# Patient Record
Sex: Female | Born: 1970 | Race: Asian | Hispanic: No | Marital: Single | Smoking: Never smoker
Health system: Southern US, Community
[De-identification: ages and names within clinical notes are randomized; demographics above are authoritative.]

## PROBLEM LIST (undated history)

## (undated) ENCOUNTER — Emergency Department (HOSPITAL_COMMUNITY): Discharge status: Absent without leave | Payer: Medicaid Other

## (undated) DIAGNOSIS — Z9289 Personal history of other medical treatment: Secondary | ICD-10-CM

## (undated) DIAGNOSIS — N343 Urethral syndrome, unspecified: Secondary | ICD-10-CM

## (undated) DIAGNOSIS — R509 Fever, unspecified: Secondary | ICD-10-CM

## (undated) DIAGNOSIS — N201 Calculus of ureter: Secondary | ICD-10-CM

## (undated) DIAGNOSIS — IMO0002 Reserved for concepts with insufficient information to code with codable children: Secondary | ICD-10-CM

## (undated) DIAGNOSIS — K219 Gastro-esophageal reflux disease without esophagitis: Secondary | ICD-10-CM

## (undated) DIAGNOSIS — N1330 Unspecified hydronephrosis: Secondary | ICD-10-CM

## (undated) DIAGNOSIS — K759 Inflammatory liver disease, unspecified: Secondary | ICD-10-CM

## (undated) DIAGNOSIS — C9010 Plasma cell leukemia not having achieved remission: Secondary | ICD-10-CM

## (undated) DIAGNOSIS — N390 Urinary tract infection, site not specified: Secondary | ICD-10-CM

## (undated) DIAGNOSIS — G709 Myoneural disorder, unspecified: Secondary | ICD-10-CM

## (undated) DIAGNOSIS — A419 Sepsis, unspecified organism: Secondary | ICD-10-CM

## (undated) DIAGNOSIS — J189 Pneumonia, unspecified organism: Secondary | ICD-10-CM

## (undated) HISTORY — PX: LIVER BIOPSY: SHX301

## (undated) HISTORY — PX: OTHER SURGICAL HISTORY: SHX169

---

## 2008-10-23 ENCOUNTER — Inpatient Hospital Stay (HOSPITAL_COMMUNITY): Admission: EM | Admit: 2008-10-23 | Discharge: 2008-11-04 | Payer: Self-pay | Admitting: Emergency Medicine

## 2008-10-24 ENCOUNTER — Ambulatory Visit: Payer: Self-pay | Admitting: Pulmonary Disease

## 2008-10-25 ENCOUNTER — Encounter (INDEPENDENT_AMBULATORY_CARE_PROVIDER_SITE_OTHER): Payer: Self-pay | Admitting: Interventional Radiology

## 2008-10-26 ENCOUNTER — Ambulatory Visit: Payer: Self-pay | Admitting: Thoracic Surgery

## 2008-10-27 ENCOUNTER — Ambulatory Visit: Payer: Self-pay | Admitting: Infectious Diseases

## 2008-10-30 ENCOUNTER — Encounter: Payer: Self-pay | Admitting: Infectious Diseases

## 2008-10-30 ENCOUNTER — Encounter: Payer: Self-pay | Admitting: Thoracic Surgery

## 2008-10-30 HISTORY — PX: OTHER SURGICAL HISTORY: SHX169

## 2008-11-14 ENCOUNTER — Ambulatory Visit: Payer: Self-pay | Admitting: Thoracic Surgery

## 2008-11-14 ENCOUNTER — Encounter: Admission: RE | Admit: 2008-11-14 | Discharge: 2008-11-14 | Payer: Self-pay | Admitting: Thoracic Surgery

## 2008-12-04 ENCOUNTER — Ambulatory Visit: Payer: Self-pay | Admitting: Internal Medicine

## 2008-12-06 ENCOUNTER — Ambulatory Visit: Payer: Self-pay | Admitting: Thoracic Surgery

## 2008-12-06 ENCOUNTER — Encounter: Admission: RE | Admit: 2008-12-06 | Discharge: 2008-12-06 | Payer: Self-pay | Admitting: Thoracic Surgery

## 2008-12-08 ENCOUNTER — Ambulatory Visit: Payer: Self-pay | Admitting: *Deleted

## 2008-12-08 ENCOUNTER — Encounter: Payer: Self-pay | Admitting: Infectious Diseases

## 2009-01-03 ENCOUNTER — Ambulatory Visit: Payer: Self-pay | Admitting: Internal Medicine

## 2009-01-09 ENCOUNTER — Ambulatory Visit: Payer: Self-pay | Admitting: Internal Medicine

## 2009-01-09 ENCOUNTER — Encounter: Payer: Self-pay | Admitting: Family Medicine

## 2009-01-09 LAB — CONVERTED CEMR LAB
AST: 26 units/L (ref 0–37)
BUN: 8 mg/dL (ref 6–23)
Basophils Relative: 0 % (ref 0–1)
CO2: 23 meq/L (ref 19–32)
Chloride: 101 meq/L (ref 96–112)
Eosinophils Absolute: 0.2 10*3/uL (ref 0.0–0.7)
Eosinophils Relative: 4 % (ref 0–5)
HCT: 36.5 % (ref 36.0–46.0)
HDL: 76 mg/dL (ref >39)
LDL Cholesterol: 101 mg/dL — ABNORMAL HIGH (ref 0–99)
Lymphocytes Relative: 29 % (ref 12–46)
MCHC: 31.5 g/dL (ref 30.0–36.0)
MCV: 88.2 fL (ref 78.0–100.0)
Monocytes Absolute: 0.5 10*3/uL (ref 0.1–1.0)
Monocytes Relative: 10 % (ref 3–12)
Neutro Abs: 2.6 10*3/uL (ref 1.7–7.7)
Platelets: 341 10*3/uL (ref 150–400)
Potassium: 4 meq/L (ref 3.5–5.3)
RDW: 13.5 % (ref 11.5–15.5)
TSH: 3.573 microintl units/mL (ref 0.350–4.500)
Total CHOL/HDL Ratio: 2.7
Triglycerides: 130 mg/dL (ref <150)
VLDL: 26 mg/dL (ref 0–40)
WBC: 4.6 10*3/uL (ref 4.0–10.5)

## 2009-01-24 ENCOUNTER — Encounter: Admission: RE | Admit: 2009-01-24 | Discharge: 2009-01-24 | Payer: Self-pay | Admitting: Infectious Diseases

## 2009-06-11 ENCOUNTER — Encounter: Admission: RE | Admit: 2009-06-11 | Discharge: 2009-06-11 | Payer: Self-pay | Admitting: Infectious Diseases

## 2010-02-24 ENCOUNTER — Encounter: Payer: Self-pay | Admitting: Thoracic Surgery

## 2010-05-09 LAB — BASIC METABOLIC PANEL
BUN: 9 mg/dL (ref 6–23)
Chloride: 98 mEq/L (ref 96–112)
GFR calc Af Amer: >60 mL/min (ref >60)
GFR calc non Af Amer: >60 mL/min (ref >60)
Potassium: 3.5 mEq/L (ref 3.5–5.1)
Sodium: 135 mEq/L (ref 135–145)

## 2010-05-09 LAB — CBC
HCT: 28.3 % — ABNORMAL LOW (ref 36.0–46.0)
MCV: 83.7 fL (ref 78.0–100.0)
Platelets: 480 10*3/uL — ABNORMAL HIGH (ref 150–400)
RBC: 3.38 MIL/uL — ABNORMAL LOW (ref 3.87–5.11)
WBC: 6.9 10*3/uL (ref 4.0–10.5)

## 2010-05-10 LAB — CBC
HCT: 32 % — ABNORMAL LOW (ref 36.0–46.0)
HCT: 32.6 % — ABNORMAL LOW (ref 36.0–46.0)
HCT: 34.1 % — ABNORMAL LOW (ref 36.0–46.0)
HCT: 37.2 % (ref 36.0–46.0)
Hemoglobin: 10.9 g/dL — ABNORMAL LOW (ref 12.0–15.0)
Hemoglobin: 9.8 g/dL — ABNORMAL LOW (ref 12.0–15.0)
MCHC: 33.2 g/dL (ref 30.0–36.0)
MCHC: 33.4 g/dL (ref 30.0–36.0)
MCHC: 33.4 g/dL (ref 30.0–36.0)
MCHC: 33.5 g/dL (ref 30.0–36.0)
MCV: 83.9 fL (ref 78.0–100.0)
MCV: 84.3 fL (ref 78.0–100.0)
MCV: 84.4 fL (ref 78.0–100.0)
Platelets: 505 10*3/uL — ABNORMAL HIGH (ref 150–400)
RBC: 3.45 MIL/uL — ABNORMAL LOW (ref 3.87–5.11)
RBC: 3.81 MIL/uL — ABNORMAL LOW (ref 3.87–5.11)
RBC: 4.05 MIL/uL (ref 3.87–5.11)
RBC: 4.41 MIL/uL (ref 3.87–5.11)
RDW: 12.6 % (ref 11.5–15.5)
RDW: 12.6 % (ref 11.5–15.5)
WBC: 6.8 10*3/uL (ref 4.0–10.5)
WBC: 7.8 10*3/uL (ref 4.0–10.5)
WBC: 7.9 10*3/uL (ref 4.0–10.5)
WBC: 9.2 10*3/uL (ref 4.0–10.5)

## 2010-05-10 LAB — QUANTIFERON TB GOLD ASSAY (BLOOD): Interferon Gamma Release Assay: UNDETERMINED — AB

## 2010-05-10 LAB — POCT I-STAT 3, ART BLOOD GAS (G3+)
Acid-Base Excess: 3 mmol/L — ABNORMAL HIGH (ref 0.0–2.0)
O2 Saturation: 98 %
Patient temperature: 98.1
pO2, Arterial: 109 mmHg — ABNORMAL HIGH (ref 80.0–100.0)

## 2010-05-10 LAB — AFB CULTURE WITH SMEAR (NOT AT ARMC)
Acid Fast Smear: NONE SEEN
Acid Fast Smear: NONE SEEN
Acid Fast Smear: NONE SEEN
Acid Fast Smear: NONE SEEN

## 2010-05-10 LAB — EXPECTORATED SPUTUM ASSESSMENT W GRAM STAIN, RFLX TO RESP C

## 2010-05-10 LAB — COMPREHENSIVE METABOLIC PANEL
Albumin: 2.5 g/dL — ABNORMAL LOW (ref 3.5–5.2)
BUN: 3 mg/dL — ABNORMAL LOW (ref 6–23)
BUN: 4 mg/dL — ABNORMAL LOW (ref 6–23)
BUN: 7 mg/dL (ref 6–23)
CO2: 25 mEq/L (ref 19–32)
CO2: 28 mEq/L (ref 19–32)
Calcium: 8.5 mg/dL (ref 8.4–10.5)
Calcium: 8.8 mg/dL (ref 8.4–10.5)
Chloride: 100 mEq/L (ref 96–112)
Chloride: 95 mEq/L — ABNORMAL LOW (ref 96–112)
Creatinine, Ser: 0.52 mg/dL (ref 0.4–1.2)
Creatinine, Ser: 0.73 mg/dL (ref 0.4–1.2)
GFR calc Af Amer: >60 mL/min (ref >60)
GFR calc non Af Amer: >60 mL/min (ref >60)
GFR calc non Af Amer: >60 mL/min (ref >60)
Glucose, Bld: 117 mg/dL — ABNORMAL HIGH (ref 70–99)
Glucose, Bld: 131 mg/dL — ABNORMAL HIGH (ref 70–99)
Glucose, Bld: 92 mg/dL (ref 70–99)
Sodium: 135 mEq/L (ref 135–145)
Total Bilirubin: 0.7 mg/dL (ref 0.3–1.2)
Total Bilirubin: 0.9 mg/dL (ref 0.3–1.2)
Total Protein: 7.1 g/dL (ref 6.0–8.3)

## 2010-05-10 LAB — BODY FLUID CELL COUNT WITH DIFFERENTIAL
Eos, Fluid: 0 %
Lymphs, Fluid: 98 %
Monocyte-Macrophage-Serous Fluid: 2 % — ABNORMAL LOW (ref 50–90)
Other Cells, Fluid: 0 %

## 2010-05-10 LAB — BODY FLUID CULTURE: Culture: NO GROWTH

## 2010-05-10 LAB — TISSUE CULTURE
Culture: NO GROWTH
Gram Stain: NONE SEEN

## 2010-05-10 LAB — URINALYSIS, ROUTINE W REFLEX MICROSCOPIC
Glucose, UA: NEGATIVE mg/dL
Ketones, ur: 15 mg/dL — AB
Nitrite: NEGATIVE
Specific Gravity, Urine: 1.022 (ref 1.005–1.030)
pH: 6.5 (ref 5.0–8.0)

## 2010-05-10 LAB — CULTURE, BLOOD (ROUTINE X 2)
Culture: NO GROWTH
Culture: NO GROWTH

## 2010-05-10 LAB — MRSA PCR SCREENING: MRSA by PCR: NEGATIVE

## 2010-05-10 LAB — HEPATITIS PANEL, ACUTE: Hep A IgM: NEGATIVE

## 2010-05-10 LAB — URINE MICROSCOPIC-ADD ON

## 2010-05-10 LAB — CROSSMATCH: ABO/RH(D): O POS

## 2010-05-10 LAB — DIFFERENTIAL
Basophils Absolute: 0 10*3/uL (ref 0.0–0.1)
Eosinophils Relative: 1 % (ref 0–5)
Lymphocytes Relative: 23 % (ref 12–46)
Lymphs Abs: 1.8 10*3/uL (ref 0.7–4.0)
Neutro Abs: 4.8 10*3/uL (ref 1.7–7.7)
Neutrophils Relative %: 61 % (ref 43–77)

## 2010-05-10 LAB — BASIC METABOLIC PANEL
CO2: 25 mEq/L (ref 19–32)
Calcium: 8.7 mg/dL (ref 8.4–10.5)
Chloride: 103 mEq/L (ref 96–112)
GFR calc Af Amer: >60 mL/min (ref >60)
GFR calc Af Amer: >60 mL/min (ref >60)
GFR calc non Af Amer: >60 mL/min (ref >60)
Glucose, Bld: 116 mg/dL — ABNORMAL HIGH (ref 70–99)
Potassium: 4.1 mEq/L (ref 3.5–5.1)
Sodium: 132 mEq/L — ABNORMAL LOW (ref 135–145)

## 2010-05-10 LAB — LIPASE, BLOOD: Lipase: 19 U/L (ref 11–59)

## 2010-05-10 LAB — HIV-1 RNA QUANT-NO REFLEX-BLD
HIV 1 RNA Quant: <48 copies/mL (ref <48)
HIV-1 RNA Quant, Log: <1.68 {Log} (ref <1.68)

## 2010-05-10 LAB — ANTI-NUCLEAR AB-TITER (ANA TITER)

## 2010-05-10 LAB — LACTATE DEHYDROGENASE, PLEURAL OR PERITONEAL FLUID: LD, Fluid: 414 U/L — ABNORMAL HIGH (ref 3–23)

## 2010-05-10 LAB — GLUCOSE, SEROUS FLUID: Glucose, Fluid: 106 mg/dL

## 2010-05-10 LAB — FUNGUS CULTURE W SMEAR
Fungal Smear: NONE SEEN
Fungal Smear: NONE SEEN

## 2010-05-10 LAB — RHEUMATOID FACTOR: Rhuematoid fact SerPl-aCnc: <20 IU/mL (ref 0–20)

## 2010-05-10 LAB — ABO/RH: ABO/RH(D): O POS

## 2010-05-10 LAB — AMYLASE: Amylase: 110 U/L (ref 27–131)

## 2010-05-10 LAB — ADENOSINE DEAMINASE, FLUID: Adenosine Deaminase, Fluid: 3.7

## 2010-05-10 LAB — POCT PREGNANCY, URINE: Preg Test, Ur: NEGATIVE

## 2010-05-10 LAB — PH, BODY FLUID: pH, Fluid: 7.5

## 2010-06-18 NOTE — Letter (Signed)
November 14, 2008   Delfino Lovett, MD  [facility]  [address] [place], [place]  [address]   Re:  Sheryl [name], Sheryl [name]               DOB:  1971-01-13   Dear Dr. 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  Her chest x-ray  shows that there is still a thickened pleura, but this is improving.  She has a temp of 100.4, saturations were 98%, blood pressure was  118/78, pulse 100, and respirations 18.  I gave her a refill for her  pain medication and Tylox since she is still having a lot of pain.  She  apparently will be seeing you in the near future regarding further  treatment of her TB.  I will see her again in 3 weeks with a chest x-  ray.  Her incisions as mentioned are well healed.   Sincerely,   Ines Bloomer, M.D.  Electronically Signed   DPB/MEDQ  D:  11/14/2008  T:  11/15/2008  Job:  [id number]

## 2010-06-18 NOTE — Assessment & Plan Note (Signed)
OFFICE VISIT   Sheryl [name], Sheryl [name]  DOB:  1970/11/11                                        December 06, 2008  CHART #:  [id number]   Ms. Sheryl [name] came back for followup today.  Her chest x-ray still shows  some reaction in the right base but this continues to improve.  She has  some moderate pain there but overall is doing well and looks much better  than when we saw her last.  I released her to return to work in 3 weeks.  Her blood pressure is 116/74, pulse 97, respirations 18, saturations  were 98%.  I will see her back again in 6 weeks for final check.   Ines Bloomer, M.D.  Electronically Signed   DPB/MEDQ  D:  12/06/2008  T:  12/06/2008  Job:  [id number]

## 2010-08-27 ENCOUNTER — Emergency Department (HOSPITAL_COMMUNITY)
Admission: EM | Admit: 2010-08-27 | Discharge: 2010-08-27 | Disposition: A | Discharge status: Home / Self Care | Payer: Medicaid Other | Attending: Emergency Medicine | Admitting: Emergency Medicine

## 2010-08-27 ENCOUNTER — Emergency Department (HOSPITAL_COMMUNITY): Payer: Medicaid Other

## 2010-08-27 ENCOUNTER — Inpatient Hospital Stay (INDEPENDENT_AMBULATORY_CARE_PROVIDER_SITE_OTHER)
Admission: RE | Admit: 2010-08-27 | Discharge: 2010-08-27 | Disposition: A | Discharge status: Home / Self Care | Payer: Medicaid Other | Source: Ambulatory Visit | Attending: Family Medicine | Admitting: Family Medicine

## 2010-08-27 ENCOUNTER — Encounter (HOSPITAL_COMMUNITY): Payer: Self-pay | Admitting: Radiology

## 2010-08-27 DIAGNOSIS — R109 Unspecified abdominal pain: Secondary | ICD-10-CM | POA: Insufficient documentation

## 2010-08-27 DIAGNOSIS — N390 Urinary tract infection, site not specified: Secondary | ICD-10-CM | POA: Insufficient documentation

## 2010-08-27 DIAGNOSIS — R10811 Right upper quadrant abdominal tenderness: Secondary | ICD-10-CM

## 2010-08-27 LAB — URINALYSIS, ROUTINE W REFLEX MICROSCOPIC
Bilirubin Urine: NEGATIVE
Ketones, ur: NEGATIVE mg/dL
Nitrite: NEGATIVE
Protein, ur: NEGATIVE mg/dL
Urobilinogen, UA: 0.2 mg/dL (ref 0.0–1.0)
pH: 6.5 (ref 5.0–8.0)

## 2010-08-27 LAB — LIPASE, BLOOD: Lipase: 30 U/L (ref 11–59)

## 2010-08-27 LAB — CBC
Hemoglobin: 10.2 g/dL — ABNORMAL LOW (ref 12.0–15.0)
MCH: 26.4 pg (ref 26.0–34.0)
RBC: 3.86 MIL/uL — ABNORMAL LOW (ref 3.87–5.11)

## 2010-08-27 LAB — DIFFERENTIAL
Basophils Relative: 0 % (ref 0–1)
Monocytes Relative: 5 % (ref 3–12)
Neutro Abs: 3.9 10*3/uL (ref 1.7–7.7)
Neutrophils Relative %: 65 % (ref 43–77)

## 2010-08-27 LAB — COMPREHENSIVE METABOLIC PANEL
ALT: 5 U/L (ref 0–35)
Alkaline Phosphatase: 72 U/L (ref 39–117)
CO2: 28 mEq/L (ref 19–32)
GFR calc Af Amer: >60 mL/min (ref >60)
GFR calc non Af Amer: >60 mL/min (ref >60)
Glucose, Bld: 88 mg/dL (ref 70–99)
Potassium: 4.1 mEq/L (ref 3.5–5.1)
Sodium: 139 mEq/L (ref 135–145)

## 2010-08-27 MED ORDER — IOHEXOL 300 MG/ML  SOLN
80.0000 mL | Freq: Once | INTRAMUSCULAR | Status: AC | PRN
  Administered 2010-08-27: 80 mL via INTRAVENOUS

## 2010-08-29 LAB — URINE CULTURE
Colony Count: >=100000
Culture  Setup Time: 201207242003

## 2011-05-05 ENCOUNTER — Emergency Department (HOSPITAL_COMMUNITY)
Admission: EM | Admit: 2011-05-05 | Discharge: 2011-05-05 | Disposition: A | Discharge status: Home / Self Care | Payer: Medicaid Other | Source: Home / Self Care

## 2011-05-05 ENCOUNTER — Encounter (HOSPITAL_COMMUNITY): Payer: Self-pay

## 2011-05-05 DIAGNOSIS — N390 Urinary tract infection, site not specified: Secondary | ICD-10-CM

## 2011-05-05 LAB — POCT URINALYSIS DIP (DEVICE)
Ketones, ur: NEGATIVE mg/dL
Nitrite: POSITIVE — AB
Protein, ur: NEGATIVE mg/dL
Urobilinogen, UA: 0.2 mg/dL (ref 0.0–1.0)
pH: 7 (ref 5.0–8.0)

## 2011-05-05 MED ORDER — ACETAMINOPHEN 325 MG PO TABS
ORAL_TABLET | ORAL | Status: AC
  Filled 2011-05-05: qty 3

## 2011-05-05 MED ORDER — CEPHALEXIN 500 MG PO CAPS: 500.0000 mg | ORAL_CAPSULE | Freq: Three times a day (TID) | ORAL | Status: AC

## 2011-05-05 MED ORDER — ACETAMINOPHEN 500 MG PO TABS
1000.0000 mg | ORAL_TABLET | Freq: Once | ORAL | Status: AC
  Administered 2011-05-05: 1000 mg via ORAL

## 2011-05-05 NOTE — ED Provider Notes (Signed)
Sheryl [name] is a 41 y.o. female who presents to Urgent Care today for  1) cough, fever, congestion starting yesterday.  No trouble breathing.  No chest pain vomiting or diarrhea.  2) abdominal pain present for over one year however worsened yesterday and became febrile. Pain radiates to the right back.  Has not tried any medications for the pain yet. Associated with dysuria described as burning when she PTs and urinary frequency.    Patient presents with translator   PMH reviewed. Significant for abdominal pain in July of 2012 that was determined to be a urinary tract infection ROS as above otherwise neg.  no chest pains, palpitations, fevers, chills,  pain nausea or vomiting. Medications reviewed. Current Facility-Administered Medications  Medication Dose Route Frequency Provider Last Rate Last Dose  . acetaminophen (TYLENOL) tablet 1,000 mg  1,000 mg Oral Once Rodolph Bong, MD   1,000 mg at 05/05/11 1911   Current Outpatient Prescriptions  Medication Sig Dispense Refill  . cephALEXin (KEFLEX) 500 MG capsule Take 1 capsule (500 mg total) by mouth 3 (three) times daily.  21 capsule  0    Exam:  BP 123/85  Pulse 106  Temp(Src) 101.4 F (38.6 C) (Oral)  Resp 18  SpO2 97%  LMP 04/23/2011 Gen: Well NAD HEENT: EOMI,  MMM Lungs: CTABL Nl WOB Heart: RRR no MRG Abd: NABS, , ND. Mildly tender over the lower midline no rebound or guarding No costovertebral angle tenderness Exts: Non edematous BL  LE, warm and well perfused.   Results for orders placed during the hospital encounter of 05/05/11 (from the past 24 hour(s))  POCT URINALYSIS DIP (DEVICE)     Status: Abnormal   Collection Time   05/05/11  7:08 PM      Component Value Range   Glucose, UA NEGATIVE  NEGATIVE (mg/dL)   Bilirubin Urine NEGATIVE  NEGATIVE    Ketones, ur NEGATIVE  NEGATIVE (mg/dL)   Specific Gravity, Urine 1.015  1.005 - 1.030    Hgb urine dipstick TRACE (*) NEGATIVE    pH 7.0  5.0 - 8.0    Protein, ur NEGATIVE   NEGATIVE (mg/dL)   Urobilinogen, UA 0.2  0.0 - 1.0 (mg/dL)   Nitrite POSITIVE (*) NEGATIVE    Leukocytes, UA TRACE (*) NEGATIVE     Assessment and Plan: 40 y.o. female with likely urinary tract infection.  Plan to treat with Keflex 500 mg 3 times a day for one week.  Followup in urgent care if not improving. Discussed warning signs or symptoms translator and provided a [place] language handout.  Respiratory symptoms may be a viral URI.  I do not think they're related.  She has good oxygen saturation and clear lung sounds.  Advised return to health care if trouble breathing.  Used a Nurse, learning disability. Patient expresses understanding.  Additionally I recommend that she followup and establish with a primary care doctor at [facility] family practice Center. I provided her with the instructions in how to do this.     Rodolph Bong, MD 05/05/11 [phone]

## 2011-05-05 NOTE — ED Notes (Signed)
C/o pain upper abdominal area, fever, cough , since yesterday; looks sick

## 2011-05-05 NOTE — Discharge Instructions (Signed)
Thank you for coming in today. Schedule a new appointment with the family practice center. Call [phone].  Take the antibiotic three times a day for 1 week.  Call or go to the emergency room if you get worse, have trouble breathing, have chest pains, or palpitations.   B?nh Vim ???ng Ti?t Ni?u (Urinary Tract Infection) Vim bng quang (vim b?ng ?i), ho?c vim th?n (vim b? th?n), ho?c nhi?m trng tuy?n ti?n li?t (vim tuy?n ti?n li?t) th??ng ?p ?ng v?i thu?c khng sinh. Hy u?ng t?t c? thu?c bc s? cho b?n cho ??n khi h?t. B?n c th? th?y ?? trong vi ngy, nh?ng HY U?NG H?T THU?C n?u khng nhi?m trng c th? khng ph?n ?ng l?i v tr? nn kh ch?a h?n. H??NG D?N CH?M [facility] T?I NH  U?ng ?? n??c v ch?t l?ng ?? n??c ti?u trong ho?c c mu vng nh?t. Ngoi vi?c u?ng th?t nhi?u n??c,??c bi?t nn u?ng n??c qu? nam vi?t qu?t (cranberry juice).   Trnh ch?t caffein, ch v ?? u?ng c ga, v nh?ng ch?t ny d? gy kch thch bng quang.   R??u c th? kch thch tuy?n ti?n li?t.   Ch? dng cc thu?c ???c bn khng c?n ??n thu?c c?a Bc s? ho?c theo toa c?a Bc s? ?? gi?m ?au, kh ch?u, hay s?t theo nh? h??ng d?n c?a Bc s?.  ?? PHNG TRNH NHI?M TRNG THM:  Th??ng xuyn ?i ti?u. Trnh nh?n ti?u lu.   Sau khi ?i ngoi, ph? n? nn lau s?ch t? tr??c ra sau, m?i gi?y ch? s? d?ng m?t l?n.   ?i ti?u tr??c v sau khi giao h?p.  TM HI?U K?T QU? XT NGHI?M Khng ph?i m?i k?t qu? ??u c s?n trong l?n khm c?a b?n. N?u k?t qu? xt nghi?m c?a b?n khng ???c tr? l?i trong l?n khm c?a b?n, hy h?n v?i chuyn gia ch?m [place] y t? ?? tm hi?u k?t qu?Imagene Sheller cho m?i th? l bnh th??ng n?u b?n khng th?y chuyn gia ch?m [place] y t? ho?c c? s? y t? ni g. ?i?u quan tr?ng ??i v?i b?n l theo di m?i k?t qu? xt nghi?m c?a b?n. HY THAM V?N V?I CHUYN GIA Y T? N?U:  B?n pht tri?n ?au l?ng.   Con b?n trn 3 thng tu?i c nhi?t ?? ?o t?i h?u mn l 100,5 F (38,1 C) tr? ln trong h?n 1 ngy.   V?n ?? (tri?u  ch?ng) c?a b?n khng t?t h?n trong 3 ngy. Quay tr? l?i s?m h?n n?u tnh tr?ng c?a b?n tr? nn t? h?n.  ?I KHM B?NH NGAY L?P T?C N?U:  ?au l?ng nhi?u hay ?au b?ng d??i nhi?u.   B?n pht tri?n ?n l?nh.   B?n b? s?t.   Con b?n trn 3 thng tu?i c nhi?t ?? ?o t?i h?u mn l 102 F (38 C) tr? ln.   Con b?n t? 3 thng tu?i tr? xu?ng c nhi?t ?? ?o t?i h?u mn l 100,4 F (38 C) tr? ln.   Bu?n nn hay i m?a.   Kh ch?u hay nng rt khi ?i ti?u.  HY CH?C CH?N R?NG B?N:  Hi?u r nh?ng h??ng d?n khi xu?t vi?n.   S? theo di tnh tr?ng b?nh c?a b?n.   S? ??n khm b?nh ngay l?p t?c nh? ? ???c h??ng d?n.  Document Released: 01/20/2005 Document Revised: 01/09/2011 [facility] Patient Information 2012 [place], [place].

## 2011-05-05 NOTE — ED Provider Notes (Signed)
Medical screening examination/treatment/procedure(s) were performed by non-physician practitioner and as supervising physician I was immediately available for consultation/collaboration.  Raynald Blend, MD 05/05/11 2031

## 2011-06-20 ENCOUNTER — Emergency Department (HOSPITAL_COMMUNITY): Payer: Medicaid Other

## 2011-06-20 ENCOUNTER — Emergency Department (HOSPITAL_COMMUNITY)
Admission: EM | Admit: 2011-06-20 | Discharge: 2011-06-20 | Disposition: A | Discharge status: Home / Self Care | Payer: Medicaid Other | Attending: Emergency Medicine | Admitting: Emergency Medicine

## 2011-06-20 DIAGNOSIS — R1013 Epigastric pain: Secondary | ICD-10-CM | POA: Insufficient documentation

## 2011-06-20 DIAGNOSIS — K279 Peptic ulcer, site unspecified, unspecified as acute or chronic, without hemorrhage or perforation: Secondary | ICD-10-CM

## 2011-06-20 LAB — CBC
HCT: 33.3 % — ABNORMAL LOW (ref 36.0–46.0)
Hemoglobin: 10.8 g/dL — ABNORMAL LOW (ref 12.0–15.0)
MCH: 26.3 pg (ref 26.0–34.0)
MCHC: 32.4 g/dL (ref 30.0–36.0)
MCV: 81 fL (ref 78.0–100.0)
Platelets: 349 10*3/uL (ref 150–400)
RBC: 4.11 MIL/uL (ref 3.87–5.11)
RDW: 13.9 % (ref 11.5–15.5)
WBC: 6.8 10*3/uL (ref 4.0–10.5)

## 2011-06-20 LAB — COMPREHENSIVE METABOLIC PANEL
ALT: <5 U/L (ref 0–35)
AST: 19 U/L (ref 0–37)
Albumin: 3.3 g/dL — ABNORMAL LOW (ref 3.5–5.2)
Alkaline Phosphatase: 70 U/L (ref 39–117)
BUN: 9 mg/dL (ref 6–23)
CO2: 27 mEq/L (ref 19–32)
Calcium: 8.9 mg/dL (ref 8.4–10.5)
Chloride: 107 mEq/L (ref 96–112)
Creatinine, Ser: 0.63 mg/dL (ref 0.50–1.10)
GFR calc Af Amer: >90 mL/min (ref >90)
GFR calc non Af Amer: >90 mL/min (ref >90)
Glucose, Bld: 91 mg/dL (ref 70–99)
Potassium: 4.1 mEq/L (ref 3.5–5.1)
Sodium: 140 mEq/L (ref 135–145)
Total Bilirubin: 0.2 mg/dL — ABNORMAL LOW (ref 0.3–1.2)
Total Protein: 7.8 g/dL (ref 6.0–8.3)

## 2011-06-20 LAB — URINALYSIS, ROUTINE W REFLEX MICROSCOPIC
Bilirubin Urine: NEGATIVE
Glucose, UA: NEGATIVE mg/dL
Hgb urine dipstick: NEGATIVE
Ketones, ur: NEGATIVE mg/dL
Leukocytes, UA: NEGATIVE
Nitrite: NEGATIVE
Protein, ur: NEGATIVE mg/dL
Specific Gravity, Urine: 1.002 — ABNORMAL LOW (ref 1.005–1.030)
Urobilinogen, UA: 0.2 mg/dL (ref 0.0–1.0)
pH: 7.5 (ref 5.0–8.0)

## 2011-06-20 LAB — LIPASE, BLOOD: Lipase: 27 U/L (ref 11–59)

## 2011-06-20 LAB — PREGNANCY, URINE: Preg Test, Ur: NEGATIVE

## 2011-06-20 MED ORDER — ONDANSETRON HCL 4 MG/2ML IJ SOLN
INTRAMUSCULAR | Status: AC
  Administered 2011-06-20: 12:00:00
  Filled 2011-06-20: qty 2

## 2011-06-20 MED ORDER — SODIUM CHLORIDE 0.9 % IV BOLUS (SEPSIS)
1000.0000 mL | Freq: Once | INTRAVENOUS | Status: AC
  Administered 2011-06-20: 1000 mL via INTRAVENOUS

## 2011-06-20 MED ORDER — HYDROMORPHONE HCL PF 1 MG/ML IJ SOLN
1.0000 mg | Freq: Once | INTRAMUSCULAR | Status: AC
  Administered 2011-06-20: 1 mg via INTRAVENOUS
  Filled 2011-06-20: qty 1

## 2011-06-20 MED ORDER — GI COCKTAIL ~~LOC~~
30.0000 mL | Freq: Once | ORAL | Status: AC
  Administered 2011-06-20: 30 mL via ORAL
  Filled 2011-06-20: qty 30

## 2011-06-20 MED ORDER — ONDANSETRON HCL 4 MG/2ML IJ SOLN
4.0000 mg | Freq: Once | INTRAMUSCULAR | Status: AC
  Administered 2011-06-20: 4 mg via INTRAVENOUS
  Filled 2011-06-20: qty 2

## 2011-06-20 MED ORDER — PANTOPRAZOLE SODIUM 20 MG PO TBEC: 40.0000 mg | DELAYED_RELEASE_TABLET | Freq: Every day | ORAL | Status: DC

## 2011-06-20 NOTE — ED Provider Notes (Signed)
History    Pt speaks some english. Primary language [place]. Language interpretor used.  Epigastric to RUE pain. Intermittent for about a year but more or less constant for past several days and more severe. Burning. Does not radiate. No n/v. No positional component. Sometimes worse after eating. No urinary complaints. No fever or chills. Denies hx of abdominal surgery. Has not tried anything for it.  CSN: [id number]  Arrival date & time 06/20/11  1141   First MD Initiated Contact with Patient 06/20/11 1159      Chief Complaint  Patient presents with  . Abdominal Pain  . Emesis    (Consider location/radiation/quality/duration/timing/severity/associated sxs/prior treatment) HPI  No past medical history on file.  No past surgical history on file.  No family history on file.  History  Substance Use Topics  . Smoking status: Not on file  . Smokeless tobacco: Not on file  . Alcohol Use: Not on file    OB History    Grav Para Term Preterm Abortions TAB SAB Ect Mult Living                  Review of Systems   Review of symptoms negative unless otherwise noted in HPI.   Allergies  Review of patient's allergies indicates no known allergies.  Home Medications   Current Outpatient Rx  Name Route Sig Dispense Refill  . PANTOPRAZOLE SODIUM 20 MG PO TBEC Oral Take 2 tablets (40 mg total) by mouth daily. 30 tablet 2    BP 113/60  Pulse 63  Temp(Src) 97.8 F (36.6 C) (Oral)  Resp 18  SpO2 100%  Physical Exam  Nursing note and vitals reviewed. Constitutional: She appears well-developed and well-nourished. No distress.  HENT:  Head: Normocephalic and atraumatic.  Eyes: Conjunctivae are normal. Right eye exhibits no discharge. Left eye exhibits no discharge.  Neck: Neck supple.  Cardiovascular: Normal rate, regular rhythm and normal heart sounds.  Exam reveals no gallop and no friction rub.   No murmur heard. Pulmonary/Chest: Effort normal and breath sounds  normal. No respiratory distress.  Abdominal: Soft. She exhibits no distension. There is tenderness.       Mild tenderness in epigastrium. No rebound or guarding. No mass. No surgical scars noted.  Musculoskeletal: She exhibits no edema and no tenderness.  Neurological: She is alert.  Skin: Skin is warm and dry.  Psychiatric: She has a normal mood and affect. Her behavior is normal. Thought content normal.    ED Course  Procedures (including critical care time)  Labs Reviewed  COMPREHENSIVE METABOLIC PANEL - Abnormal; Notable for the following:    Albumin 3.3 (*)    Total Bilirubin 0.2 (*)    All other components within normal limits  CBC - Abnormal; Notable for the following:    Hemoglobin 10.8 (*)    HCT 33.3 (*)    All other components within normal limits  URINALYSIS, ROUTINE W REFLEX MICROSCOPIC - Abnormal; Notable for the following:    Specific Gravity, Urine 1.002 (*)    All other components within normal limits  LIPASE, BLOOD  PREGNANCY, URINE   US Abdomen Complete  06/20/2011  *RADIOLOGY REPORT*  Clinical Data:  41 year old female with abdominal pain and vomiting.  COMPLETE ABDOMINAL ULTRASOUND  Comparison:  CTs of the abdomen and pelvis 08/27/2010 and earlier.  Findings:  Gallbladder:  No gallstones, gallbladder wall thickening, or pericholecystic fluid. No sonographic Murphy's sign elicited.  Common bile duct:  Normal measuring 2 mm diameter.  Liver:  No focal lesion identified.  Within normal limits in parenchymal echogenicity.  IVC:  Appears normal.  Pancreas:  No focal abnormality seen.  Spleen:  Multiloculated cystic lesion along the lateral aspect of the spleen, with subcapsular appearance, is not significantly changed and 2010 and encompasses 5.3 x 2.3 x 5.3 cm.  No other focal splenic abnormality.  Splenic length 6.3 cm.  Right Kidney:  Normal measuring 10.4 cm in length.  Left Kidney:  Normal measuring 10.7 cm in length.  Abdominal aorta:  No aneurysm identified.   IMPRESSION: 1.  Normal gallbladder. 2.  No acute findings in the abdomen. 3.  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  Original Report Authenticated By: Ulla Potash III, M.D.     1. Abdominal pain   2. PUD (peptic ulcer disease)       MDM  40yF with epigastric pain. Gastritis, peptic ulcer disease, biliary colic, cholelithiasis, cholecystitis, cholangitis, hepatitis, renal colic, urinary tract infection, colitis, constipation, gastroenteritis, atypical ACS, mesenteric ischemia all considered among other etiologies in the patient's differential diagnosis.  Chronicity, location, burning nature and sometimes worsening after eating and unremarkable w/u as to other etiology makes me suspect PUD. Will give trial of PPI. Pt with no PCP. Resource list provided.         Raeford Razor, MD 06/20/11 [phone]

## 2011-06-20 NOTE — ED Notes (Signed)
EDP showed pt to the bathroom.  Sample not obtained.

## 2011-06-20 NOTE — Discharge Instructions (Signed)
?au B?ng (Abdominal Pain) ?au b?ng ho?c ?au d? dy c th? do r?t nhi?u nguyn nhn. Bc s? c?a b?n quy?t ??nh m?c ?? nghim tr?ng c?a c?n ?au c?a b?n b?ng cch khm, v c th? lm xt nghi?m mu v ch?p X-quang. R?t nhi?u tr??ng h?p c th? ???c theo di v ?i?u tr? t?i nh. ?a s? ?au b?ng ? tr? em l b?nh ch?c n?ng. ?i?u ny ny c ngh?a ?au b?ng khng ph?i l do b?nh v c th? s? ?? m khng c?n ?i?u tr?Sheryl Sheryl nhin, trong nhi?u tr??ng h?p, ph?i m?t nhi?u th?i gian h?n tr??c khi nguyn nhn r rng c?a c?n ?au c th? ???c tm th?y. Tr??c th?i ?i?m ?, c th? khng bi?t l b?n c c?n lm thm cc xt nghi?m, ho?c c c?n nh?p vi?n hay ph?u thu?t khng. H??NG D?N CH?M Sheryl Sheryl T?I NH  Khng u?ng ho?c cho u?ng thu?c nhu?n trng, tr? khi ???c bc s? yu c?u.   Ch? u?ng thu?c gi?m ?au n?u ???c bc s? yu c?u.   Ch? dng cc thu?c k ??n ho?c khng k ??n ?? gi?m ?au, kh ch?u ho?c s?t theo ch? d?n c?a Bc s?.   C? g?ng n theo ch? ? n u?ng c ch?t l?ng khng c ci - n?c th?t h?m, n?c th?t ng, ch, ho?c n?c trong mi?n l theo s? ch? d?n c?a chuyn gia ch?m Sheryl Sheryl y t? c?a b?n. B?n c th? d?n d?n chuy?n sang ch? ?? ?n nh?t n?u b?nh nhn tiu ha ???c.  HY NGAY L?P T?C THAM V?N V?I CHUYN GIA Y T? N?U:  Khng h?t ?au.   B?n b? s?t.   Nn m?a nhi?u l?n.   B?t ??u ?au m?t ch? ? gc bn ph?i b?ng d??i (c th? l b?nh vim ru?t th?a), ho?c gc bn tri b?ng d??i ? ng??i l?n (c th? l vim ru?t k?t ho?c vim ti th?a).   i ngoi ra phn c mu (phn mu ? ti ho?c en nh h?c-n).  HY CH?C CH?N R?NG B?N:  Hi?u r nh?ng h??ng d?n khi xu?t vi?n.   S? theo di tnh tr?ng b?nh c?a b?n.   S? ??n khm b?nh ngay l?p t?c nh? ? ???c h??ng d?n.  Document Released: 01/20/2005 Document Revised: 01/09/2011 Sheryl Sheryl Patient Sheryl 2012 [place], [place].Vim Lot H? Th?ng Tiu Ho (Peptic Ulcers) V?t l? lot l nh?ng ch? h? mi?ng ho?c ch? l? lot nh? trong mng d? dy ho?c t trng (ph?n ??u c?a ru?t  non). Thu?t ng? vim lot h? th?ng tiu ha ???c s? d?ng ?? m t? c? hai d?ng lot. C r?t nhi?u ph??ng php ?i?u tr? gi?m hi?n t??ng kh ch?u ?i km v?i nh?ng ch? l? lot, v trong ?a s? cc tr??ng h?p, nh?ng ch? l? lot c li?n l?i. TI?N TRI?N D?N ??N B?NH VIM LOT H? TH?NG TIU HO V CC Y?U T? TH??NG G?P Vim lot h? th?ng tiu ho ch? xu?t hi?n ? nh?ng ch? trong h? th?ng tiu ho c ti?p xc v?i d?ch v? tiu ho do d? dy ti?t ra. Nh?ng d?ch v? ny g?m c a-xt v enzyme (pepsin) c tc d?ng ph v? protein. Nhi?u ng??i m?c b?nh lot t trng c qu nhi?u d?ch v? tiu ho t? d? dy ti?t xu?ng. ?a s? nh?ng ng??i b? lot d? dy c l??ng a-xt trong d? dy bnh th??ng ho?c d??i m?c bnh th??ng. Mng nh?y bn trong d? dy v t trng c s?c ?? khng y?u c  th? d? d?n ??n vim lot h?n. B?nh lot t trng hay gy ?au ? vng gi?a x??ng ?c v r?n (th??ng v?). Tri?u ch?ng ?au thay ??i t? ?au d? d?i ??n c?m gic ?au lm rm lin t?c ho?c nng rt. ?i khi th?y ?au khi ng? v c th? lm ng??i b?nh t?nh ng? gi?a ?m. Tuy nhin, ?au do lot t trng th??ng xu?t hi?n hai ho?c ba gi? sau khi ?n, khi d? dy r?ng khng. Nh?ng tri?u ch?ng th??ng g?p khc g?m c ?n qu no ?? gi?m ?au. ?n lm gi?m ?au do lot t trng. C th? th?y tri?u ch?ng ?au do lot d? dy ? cng m?t ch? v?i ?au do lot t trng, ho?c h?i cao h?n m?t cht. C?ng c th? c c?m gic no, ??y b?ng v ? nng. ?i khi c hi?n t??ng ?au khi d? dy ??y th?c ?n, d?n ??n chn ?n v sau ? st cn. ?au do c? hai d?ng lot d? ch?a b?ng cc lo?i thu?c lm trung ho a-xt trong d? dy ho?c ng?a ti?t a-xt. H??NG D?N CH?M Sheryl Sheryl T?I NH  Ng??i ta lun th?y dng cc s?n ph?m thu?c l s? lm ch? lot lnh lu h?n. HY NG?NG HT THU?C.   Trnh r??u, aspirin v cc lo?i thu?c gi?m vim khc. Nh?ng ch?t ny lm mng d? dy y?u ?i.   ?n bnh th??ng v b? d??ng.   Trnh nh?ng th?c ?n lm b?n kh ch?u.   U?ng thu?c v thu?c khng a-xt theo ch? d?n. M?t s? lo?i thu?c  ? l thu?c mua t?i qu?y khng c?n ??n thu?c u?ng ?? trung ho a-xt trong d? dy. M?t s? khc l thu?c c k ??n c tc d?ng lm ch? lot lnh nhanh h?n b?ng cch h?n ch? ti?t a-xt, ng?n c?n s?n xu?t a-xt ho?c t?o l?p mng ph? b?o v? trn ch? lot. N?u lo?i thu?c khng a-xt ??c tr? ???c k, khng ???c thay ??i nhn hi?u thu?c m khng c s? ??ng  c?a bc s?Sheryl Sheryl th??ng khng c?n ph?i ph?u thu?t, v ch? ?? ?n u?ng v/ho?c ?i?u tr? b?ng thu?c th??ng c tc d?ng. C th? c?n ph?u thu?t n?u c hi?n t??ng xuyn th?ng ho?c t?c do ln s?o, v/ho?c th?y c hi?n t??ng ch?y mu khng c?m ???c, ho?c n?u khng th? gi?m ?au. HY NGAY L?P T?C THAM V?N V?I CHUYN GIA Y T? N?U:  B?n th?y c d?u hi?u ch?y mu, k? c? nn ra mu ?? t??i ho?c bi ti?t phn c mu, ho?c phn mu ?en nh? h?c n.   B?n b? suy nh??c, m?t m?i ho?c thi?u t?nh to. Nh?ng tri?u ch?ng ny c th? l do xu?t huy?t (ch?y mu) nhi?u, v c th? d?n ??n s?c.   B?n b? ?au b?ng ??t ng?t v ?au d? d?i. ?y l d?u hi?u ??u tin c?a hi?n t??ng xuyn th?ng v c th? ph?i c?n ?i?u tr? ph?u thu?t ngay.   B?n b? ?au d? d?i, nn m?a lin t?c, ?y c th? l d?u hi?u c hi?n t??ng t?c ???ng tiu ho.  Document Released: 01/20/2005 Document Revised: 01/09/2011 Sheryl Sheryl 2012 [place], [place].  RESOURCE GUIDE  Dental Problems  Patients with Medicaid: Sheryl Sheryl [phone] [address] Sheryl Sheryl.  [address] Phone:  [phone]                                                  Phone:  [phone]  If unable to pay or uninsured, contact:  [facility] or [facility]. to become qualified for [facility] adult dental clinic.  Chronic Pain Problems Contact [facility]  [phone] Patients need to be referred by their primary care doctor.  Insufficient Money for Medicine Contact [facility]yl Sheryl:  call "211" or [facility]  [phone].  No Primary Care Doctor Call [facility]yl [facility]yl  [phone] Other agencies that provide inexpensive medical care    [facility]  [phone]    [facility]  [phone]    [facility]  [phone]    [facility]  [phone]    [facility]  [phone]    [facility]  [phone]  Psychological Services [name]  [phone] [facility] Services  [phone] [place] [facility]   [phone] (emergency services [phone])  Substance Abuse Resources [facility] and Drug Services  [phone] [facility] [phone] [place] [phone] Floydene Flock [phone] Residential & Outpatient Substance Abuse Program  [phone]  Abuse/Neglect [place] [facility] Child Abuse Hotline [phone] [facility] Child Abuse Hotline [phone] (After Hours)  Emergency Shelter [facility] [phone]  Maternity Homes Room at [facility] [place] [facility] [place] [phone] [facility] Alert Services [phone]  MRSA Hotline #:   (386)864-9903    [facility] Resources  [facility]     [facility]                          [facility] [address] [place]                       [address]      [address] [place] [address]  Blondell Reveal Phone:  [phone]                                   Phone:  [phone]                 Phone:  [phone]  [facility] Phone:  [phone]  [facility] Child Abuse Hotline [phone] (After Hours)

## 2011-06-20 NOTE — ED Notes (Addendum)
Pt reported 3 days of vomiting with Abdominal pain. Pt had zofran 4mg  and NS 400 cc enroute by ems. Alert, no distress on arrival

## 2011-06-20 NOTE — ED Notes (Signed)
[id number]<AV> Expected date:<BR> Expected time:11:35 AM<BR> Means of arrival:<BR> Comments:<BR> M100 - 40yoF NV x3days, ruq abd pain

## 2011-11-25 ENCOUNTER — Encounter (HOSPITAL_COMMUNITY): Payer: Self-pay | Admitting: *Deleted

## 2011-11-25 ENCOUNTER — Emergency Department (HOSPITAL_COMMUNITY)
Admission: EM | Admit: 2011-11-25 | Discharge: 2011-11-25 | Disposition: A | Discharge status: Home / Self Care | Payer: Medicaid Other | Source: Home / Self Care

## 2011-11-25 DIAGNOSIS — K219 Gastro-esophageal reflux disease without esophagitis: Secondary | ICD-10-CM

## 2011-11-25 HISTORY — DX: Gastro-esophageal reflux disease without esophagitis: K21.9

## 2011-11-25 MED ORDER — OMEPRAZOLE 40 MG PO CPDR: 40.0000 mg | DELAYED_RELEASE_CAPSULE | Freq: Every day | ORAL | Status: DC

## 2011-11-25 NOTE — ED Notes (Signed)
Pt  Has  A  History  Of  gerd   She   Reports  abd  Pain /  Bloated         Sensation       With  Belching          For  sev  Days  Has ongoing  Symptoms  Of    gerd in  Past     Vomited x  1   Today   No  Diarrhea       Interpretor  At  Bedside

## 2011-11-25 NOTE — ED Provider Notes (Signed)
History     CSN: [id number]  Arrival date & time 11/25/11  4098   None     Chief Complaint  Patient presents with  . Abdominal Pain    (Consider location/radiation/quality/duration/timing/severity/associated sxs/prior treatment) HPI Comments: Refill [place] female is here with her interpreter complaining of gastric discomfort and a burning feeling in the epigastrium and esophagus. She also complains of belching gas and sometimes nausea and vomiting. She's had these symptoms for 2 years. She has been here twice according to her  interpreter. Denies fever chills or other systemic symptoms. Denies bleeding.   Past Medical History  Diagnosis Date  . GERD (gastroesophageal reflux disease)     Past Surgical History  Procedure Date  . Liver biopsy     No family history on file.  History  Substance Use Topics  . Smoking status: Never Smoker   . Smokeless tobacco: Not on file  . Alcohol Use: No    OB History    Grav Para Term Preterm Abortions TAB SAB Ect Mult Living                  Review of Systems  Constitutional: Negative for fever and chills.  HENT: Negative.   Respiratory: Negative for cough and shortness of breath.   Cardiovascular: Negative for chest pain and palpitations.  Gastrointestinal: Positive for nausea, vomiting, abdominal pain and abdominal distention. Negative for constipation and blood in stool.       The symptoms of nausea and vomiting all remote and have not occurred in the past several days.  Genitourinary: Negative.   Musculoskeletal: Negative.   Skin: Negative for color change, pallor and rash.  Neurological: Negative.     Allergies  Review of patient's allergies indicates no known allergies.  Home Medications   Current Outpatient Rx  Name Route Sig Dispense Refill  . OMEPRAZOLE 40 MG PO CPDR Oral Take 1 capsule (40 mg total) by mouth daily. 30 capsule 1  . PANTOPRAZOLE SODIUM 20 MG PO TBEC Oral Take 2 tablets (40 mg total) by mouth  daily. 30 tablet 2    BP 125/70  Pulse 72  Temp 98.7 F (37.1 C) (Oral)  Resp 18  SpO2 100%  LMP 11/16/2011  Physical Exam  Constitutional: She appears well-developed and well-nourished.  Eyes: Conjunctivae normal and EOM are normal.  Neck: Normal range of motion. Neck supple.  Cardiovascular: Normal rate and normal heart sounds.   Pulmonary/Chest: Effort normal and breath sounds normal. She has no wheezes.  Abdominal: Soft. She exhibits no mass. There is tenderness. There is no rebound and no guarding.       Mild tenderness in the epigastrium.  Musculoskeletal: Normal range of motion. She exhibits no edema and no tenderness.  Lymphadenopathy:    She has no cervical adenopathy.  Neurological: She is alert. No cranial nerve deficit.  Skin: Skin is warm and dry.  Psychiatric: She has a normal mood and affect.    ED Course  Procedures (including critical care time)  Labs Reviewed - No data to display No results found.   1. GERD (gastroesophageal reflux disease)       MDM  Omeprazole 40 mg daily Bland diet. No greasy spicy or heavy meals. Abdomen the name of the [facility] for followup. She will likely will need to see a gastroenterologist and she's been having symptoms intermittently for 2 years.        Hayden Rasmussen, NP 11/25/11 1108

## 2011-11-26 NOTE — ED Provider Notes (Signed)
Medical screening examination/treatment/procedure(s) were performed by resident physician or non-physician practitioner and as supervising physician I was immediately available for consultation/collaboration.   Leandre Wien DOUGLAS MD.    Zacharias Ridling D Cathryn Gallery, MD 11/26/11 2106 

## 2012-09-03 DIAGNOSIS — A419 Sepsis, unspecified organism: Secondary | ICD-10-CM

## 2012-09-03 HISTORY — DX: Sepsis, unspecified organism: A41.9

## 2012-09-25 ENCOUNTER — Encounter (HOSPITAL_COMMUNITY): Payer: Self-pay | Admitting: *Deleted

## 2012-09-25 ENCOUNTER — Emergency Department (HOSPITAL_COMMUNITY): Payer: Medicaid Other

## 2012-09-25 ENCOUNTER — Encounter (HOSPITAL_COMMUNITY)
Admission: EM | Disposition: A | Discharge status: Home / Self Care | Payer: Self-pay | Source: Home / Self Care | Attending: Urology

## 2012-09-25 ENCOUNTER — Emergency Department (HOSPITAL_COMMUNITY): Payer: Medicaid Other | Admitting: Anesthesiology

## 2012-09-25 ENCOUNTER — Encounter (HOSPITAL_COMMUNITY): Payer: Self-pay | Admitting: Anesthesiology

## 2012-09-25 ENCOUNTER — Inpatient Hospital Stay (HOSPITAL_COMMUNITY)
Admission: EM | Admit: 2012-09-25 | Discharge: 2012-09-27 | DRG: 693 | Disposition: A | Discharge status: Home / Self Care | Payer: Medicaid Other | Attending: Urology | Admitting: Urology

## 2012-09-25 DIAGNOSIS — N201 Calculus of ureter: Principal | ICD-10-CM

## 2012-09-25 DIAGNOSIS — N390 Urinary tract infection, site not specified: Secondary | ICD-10-CM

## 2012-09-25 DIAGNOSIS — D72829 Elevated white blood cell count, unspecified: Secondary | ICD-10-CM | POA: Diagnosis present

## 2012-09-25 DIAGNOSIS — N3090 Cystitis, unspecified without hematuria: Secondary | ICD-10-CM | POA: Diagnosis present

## 2012-09-25 DIAGNOSIS — A419 Sepsis, unspecified organism: Secondary | ICD-10-CM | POA: Diagnosis present

## 2012-09-25 DIAGNOSIS — N1330 Unspecified hydronephrosis: Secondary | ICD-10-CM

## 2012-09-25 DIAGNOSIS — K219 Gastro-esophageal reflux disease without esophagitis: Secondary | ICD-10-CM | POA: Diagnosis present

## 2012-09-25 HISTORY — DX: Inflammatory liver disease, unspecified: K75.9

## 2012-09-25 HISTORY — PX: CYSTOSCOPY W/ URETERAL STENT PLACEMENT: SHX1429

## 2012-09-25 LAB — CBC WITH DIFFERENTIAL/PLATELET
Basophils Absolute: 0 10*3/uL (ref 0.0–0.1)
Basophils Relative: 0 % (ref 0–1)
Eosinophils Absolute: 0 10*3/uL (ref 0.0–0.7)
Eosinophils Relative: 0 % (ref 0–5)
HCT: 31.3 % — ABNORMAL LOW (ref 36.0–46.0)
Lymphocytes Relative: 4 % — ABNORMAL LOW (ref 12–46)
MCH: 25.6 pg — ABNORMAL LOW (ref 26.0–34.0)
MCHC: 31.9 g/dL (ref 30.0–36.0)
MCV: 80.3 fL (ref 78.0–100.0)
Monocytes Absolute: 1.8 10*3/uL — ABNORMAL HIGH (ref 0.1–1.0)
RDW: 13.7 % (ref 11.5–15.5)

## 2012-09-25 LAB — COMPREHENSIVE METABOLIC PANEL
ALT: 12 U/L (ref 0–35)
Albumin: 3 g/dL — ABNORMAL LOW (ref 3.5–5.2)
Alkaline Phosphatase: 90 U/L (ref 39–117)
Potassium: 3.8 mEq/L (ref 3.5–5.1)
Sodium: 133 mEq/L — ABNORMAL LOW (ref 135–145)
Total Protein: 7.2 g/dL (ref 6.0–8.3)

## 2012-09-25 LAB — URINALYSIS, ROUTINE W REFLEX MICROSCOPIC
Glucose, UA: NEGATIVE mg/dL
Specific Gravity, Urine: 1.021 (ref 1.005–1.030)
Urobilinogen, UA: 0.2 mg/dL (ref 0.0–1.0)

## 2012-09-25 LAB — POCT PREGNANCY, URINE: Preg Test, Ur: NEGATIVE

## 2012-09-25 LAB — URINE MICROSCOPIC-ADD ON

## 2012-09-25 SURGERY — CYSTOSCOPY, WITH RETROGRADE PYELOGRAM AND URETERAL STENT INSERTION
Anesthesia: General | Site: Ureter | Laterality: Right | Wound class: Dirty or Infected

## 2012-09-25 MED ORDER — LACTATED RINGERS IV SOLN
INTRAVENOUS | Status: DC | PRN
  Administered 2012-09-25: 14:00:00 via INTRAVENOUS

## 2012-09-25 MED ORDER — DOCUSATE SODIUM 100 MG PO CAPS
100.0000 mg | ORAL_CAPSULE | Freq: Two times a day (BID) | ORAL | Status: DC
  Administered 2012-09-25 – 2012-09-27 (×4): 100 mg via ORAL
  Filled 2012-09-25 (×5): qty 1

## 2012-09-25 MED ORDER — FENTANYL CITRATE 0.05 MG/ML IJ SOLN
INTRAMUSCULAR | Status: DC | PRN
  Administered 2012-09-25 (×4): 25 ug via INTRAVENOUS

## 2012-09-25 MED ORDER — SODIUM CHLORIDE 0.9 % IV SOLN
Freq: Once | INTRAVENOUS | Status: AC
  Administered 2012-09-25: 11:00:00 via INTRAVENOUS

## 2012-09-25 MED ORDER — SODIUM CHLORIDE 0.9 % IV BOLUS (SEPSIS)
500.0000 mL | Freq: Once | INTRAVENOUS | Status: AC
  Administered 2012-09-25: 500 mL via INTRAVENOUS

## 2012-09-25 MED ORDER — DEXTROSE 5 % IV SOLN: 1.0000 g | INTRAVENOUS | Status: DC

## 2012-09-25 MED ORDER — SENNA 8.6 MG PO TABS
1.0000 | ORAL_TABLET | Freq: Two times a day (BID) | ORAL | Status: DC
  Administered 2012-09-25 – 2012-09-27 (×4): 8.6 mg via ORAL
  Filled 2012-09-25 (×4): qty 1

## 2012-09-25 MED ORDER — DEXTROSE 5 % IV SOLN
1.0000 g | INTRAVENOUS | Status: DC
  Administered 2012-09-26 – 2012-09-27 (×2): 1 g via INTRAVENOUS
  Filled 2012-09-25 (×2): qty 10

## 2012-09-25 MED ORDER — KCL IN DEXTROSE-NACL 20-5-0.45 MEQ/L-%-% IV SOLN
INTRAVENOUS | Status: AC
  Filled 2012-09-25: qty 1000

## 2012-09-25 MED ORDER — ONDANSETRON HCL 4 MG/2ML IJ SOLN
4.0000 mg | Freq: Once | INTRAMUSCULAR | Status: AC
  Administered 2012-09-25: 4 mg via INTRAVENOUS
  Filled 2012-09-25: qty 2

## 2012-09-25 MED ORDER — IOHEXOL 300 MG/ML  SOLN
INTRAMUSCULAR | Status: AC
  Filled 2012-09-25: qty 1

## 2012-09-25 MED ORDER — ACETAMINOPHEN 10 MG/ML IV SOLN
1000.0000 mg | Freq: Once | INTRAVENOUS | Status: AC
  Administered 2012-09-25: 1000 mg via INTRAVENOUS
  Filled 2012-09-25: qty 100

## 2012-09-25 MED ORDER — LIDOCAINE HCL (CARDIAC) 20 MG/ML IV SOLN
INTRAVENOUS | Status: DC | PRN
  Administered 2012-09-25: 50 mg via INTRAVENOUS

## 2012-09-25 MED ORDER — MORPHINE SULFATE 4 MG/ML IJ SOLN
4.0000 mg | Freq: Once | INTRAMUSCULAR | Status: AC
  Administered 2012-09-25: 4 mg via INTRAVENOUS
  Filled 2012-09-25: qty 1

## 2012-09-25 MED ORDER — OXYCODONE HCL 5 MG PO TABS
5.0000 mg | ORAL_TABLET | ORAL | Status: DC | PRN
  Administered 2012-09-25 – 2012-09-26 (×2): 5 mg via ORAL
  Filled 2012-09-25 (×2): qty 1

## 2012-09-25 MED ORDER — KCL IN DEXTROSE-NACL 20-5-0.45 MEQ/L-%-% IV SOLN
INTRAVENOUS | Status: DC
  Administered 2012-09-25 (×2): via INTRAVENOUS
  Filled 2012-09-25 (×7): qty 1000

## 2012-09-25 MED ORDER — IOHEXOL 300 MG/ML  SOLN
INTRAMUSCULAR | Status: DC | PRN
  Administered 2012-09-25: 10 mL via INTRAVENOUS

## 2012-09-25 MED ORDER — DEXTROSE 5 % IV SOLN
5.0000 mg/kg | INTRAVENOUS | Status: AC
  Administered 2012-09-25: 295 mg via INTRAVENOUS
  Filled 2012-09-25: qty 7.38

## 2012-09-25 MED ORDER — CEFTRIAXONE SODIUM 2 G IJ SOLR
100.0000 mg/kg | Freq: Once | INTRAMUSCULAR | Status: DC
Start: 2012-09-25 — End: 2012-09-25

## 2012-09-25 MED ORDER — 0.9 % SODIUM CHLORIDE (POUR BTL) OPTIME
TOPICAL | Status: DC | PRN
  Administered 2012-09-25: 1000 mL

## 2012-09-25 MED ORDER — MIDAZOLAM HCL 5 MG/5ML IJ SOLN
INTRAMUSCULAR | Status: DC | PRN
  Administered 2012-09-25: 2 mg via INTRAVENOUS

## 2012-09-25 MED ORDER — HYDROMORPHONE HCL PF 1 MG/ML IJ SOLN
0.5000 mg | INTRAMUSCULAR | Status: DC | PRN
  Administered 2012-09-26 – 2012-09-27 (×7): 1 mg via INTRAVENOUS
  Filled 2012-09-25 (×8): qty 1

## 2012-09-25 MED ORDER — ACETAMINOPHEN 325 MG PO TABS
650.0000 mg | ORAL_TABLET | ORAL | Status: DC | PRN
  Administered 2012-09-25 – 2012-09-27 (×4): 650 mg via ORAL
  Filled 2012-09-25 (×4): qty 2

## 2012-09-25 MED ORDER — ONDANSETRON HCL 4 MG/2ML IJ SOLN
4.0000 mg | INTRAMUSCULAR | Status: DC | PRN
  Administered 2012-09-26 – 2012-09-27 (×4): 4 mg via INTRAVENOUS
  Filled 2012-09-25 (×5): qty 2

## 2012-09-25 MED ORDER — CEFTRIAXONE SODIUM 1 G IJ SOLR
1.0000 g | Freq: Once | INTRAMUSCULAR | Status: AC
  Administered 2012-09-25: 1 g via INTRAVENOUS
  Filled 2012-09-25: qty 10

## 2012-09-25 MED ORDER — ONDANSETRON HCL 4 MG/2ML IJ SOLN
INTRAMUSCULAR | Status: DC | PRN
  Administered 2012-09-25: 4 mg via INTRAVENOUS

## 2012-09-25 MED ORDER — PROPOFOL 10 MG/ML IV BOLUS
INTRAVENOUS | Status: DC | PRN
  Administered 2012-09-25: 120 mg via INTRAVENOUS

## 2012-09-25 MED ORDER — LACTATED RINGERS IV SOLN: INTRAVENOUS | Status: DC

## 2012-09-25 MED ORDER — FENTANYL CITRATE 0.05 MG/ML IJ SOLN: 25.0000 ug | INTRAMUSCULAR | Status: DC | PRN

## 2012-09-25 MED ORDER — KETOROLAC TROMETHAMINE 30 MG/ML IJ SOLN: 15.0000 mg | Freq: Once | INTRAMUSCULAR | Status: DC | PRN

## 2012-09-25 MED ORDER — PROMETHAZINE HCL 25 MG/ML IJ SOLN: 6.2500 mg | INTRAMUSCULAR | Status: DC | PRN

## 2012-09-25 SURGICAL SUPPLY — 2 items
PACK CYSTO (CUSTOM PROCEDURE TRAY) ×2 IMPLANT
STENT CONTOUR 6FRX24X.038 (STENTS) ×2 IMPLANT

## 2012-09-25 NOTE — ED Notes (Signed)
md at bedside

## 2012-09-25 NOTE — Progress Notes (Signed)
Patient's BP has dropped to 87/45,  Hr 75. Dr. Berneice Heinrich notified and was advised to maintain current treatments and to notify MD if HR increases to 120 or greater. Will continue to monitor pt. Sheryl [name]

## 2012-09-25 NOTE — ED Notes (Signed)
pts sister [facility]   [phone], [phone]

## 2012-09-25 NOTE — Progress Notes (Signed)
Report called  

## 2012-09-25 NOTE — ED Notes (Signed)
Pt alert and oriented x4. Respirations even and unlabored, bilateral symmetrical rise and fall of chest. Skin warm and dry. In no acute distress. Denies needs.   

## 2012-09-25 NOTE — ED Notes (Signed)
md at bedside.  Using interpretor phone, speaking with pt.

## 2012-09-25 NOTE — ED Provider Notes (Signed)
CSN: [id number]     Arrival date & time 09/25/12  0750 History     First MD Initiated Contact with Patient 09/25/12 [phone]     Chief Complaint  Patient presents with  . Abdominal Pain    Patient is a 42 y.o. female presenting with abdominal pain. The history is provided by the patient and a relative. A language interpreter was used Careers information officer interpreter [phone]).  Abdominal Pain Pain location:  RUQ Pain quality: sharp   Pain radiates to:  R flank Pain severity:  Severe Onset quality:  Gradual Duration:  3 days Timing:  Intermittent Progression:  Worsening Chronicity:  New Relieved by:  Nothing Worsened by:  Palpation Associated symptoms: chest pain, cough, fever, nausea and shortness of breath   Associated symptoms: no diarrhea, no dysuria, no vaginal bleeding, no vaginal discharge and no vomiting     Past Medical History  Diagnosis Date  . GERD (gastroesophageal reflux disease)   . Hepatitis    Past Surgical History  Procedure Laterality Date  . Liver biopsy    . Lung surgery      2011   History reviewed. No pertinent family history. History  Substance Use Topics  . Smoking status: Never Smoker   . Smokeless tobacco: Not on file  . Alcohol Use: No   OB History   Grav Para Term Preterm Abortions TAB SAB Ect Mult Living                 Review of Systems  Constitutional: Positive for fever.  Respiratory: Positive for cough and shortness of breath.   Cardiovascular: Positive for chest pain.  Gastrointestinal: Positive for nausea and abdominal pain. Negative for vomiting and diarrhea.  Genitourinary: Positive for frequency. Negative for dysuria, vaginal bleeding and vaginal discharge.  Neurological: Negative for weakness.  All other systems reviewed and are negative.    Allergies  Review of patient's allergies indicates no known allergies.  Home Medications   Current Outpatient Rx  Name  Route  Sig  Dispense  Refill  . omeprazole (PRILOSEC) 40 MG  capsule   Oral   Take 1 capsule (40 mg total) by mouth daily.   30 capsule   1   . EXPIRED: pantoprazole (PROTONIX) 20 MG tablet   Oral   Take 2 tablets (40 mg total) by mouth daily.   30 tablet   2    BP 110/50  Pulse 98  Temp(Src) 99.5 F (37.5 C) (Oral)  Resp 22  SpO2 100%  LMP 09/19/2012 Physical Exam CONSTITUTIONAL: Well developed/well nourished, uncomfortable appearing HEAD: Normocephalic/atraumatic EYES: EOMI/PERRL, no icterus ENMT: Mucous membranes moist NECK: supple no meningeal signs SPINE:entire spine nontender CV: S1/S2 noted, no murmurs/rubs/gallops noted LUNGS: Lungs are clear to auscultation bilaterally, no apparent distress ABDOMEN: soft, moderate RUQ tenderness to palpation, no rebound or guarding GU:no cva tenderness NEURO: Pt is awake/alert, moves all extremitiesx4 EXTREMITIES: pulses normal, full ROM SKIN: warm, color normal PSYCH:anxious  ED Course   Procedures  Labs Reviewed  COMPREHENSIVE METABOLIC PANEL  CBC WITH DIFFERENTIAL  LIPASE, BLOOD  LACTIC ACID, PLASMA  URINALYSIS, ROUTINE W REFLEX MICROSCOPIC  8:28 AM Pt with RUQ pain for 3 days that abruptly worsened last night.  She reported to nurse she had liver biopsy yrs ago, but via phone interpreter she told me lung biopsy yrs ago.  She denies h/o cholecystectomy/appendectomy Labs/imaging ordered.  Will follow closely.  Will order CXR as pt did report CP on right side. 11:24 AM D/w urology  dr Berneice Heinrich.  Concern for obstructive stone given US findings.  He recommends CT imaging to identify stone 12:11 PM Dr Berneice Heinrich to admit patient.  He will see in the ED   MDM  Nursing notes including past medical history and social history reviewed and considered in documentation Labs/vital reviewed and considered xrays reviewed and considered     Date: 09/25/2012  Rate: 88  Rhythm: normal sinus rhythm  QRS Axis: right  Intervals: normal  ST/T Wave abnormalities: normal  Conduction  Disutrbances:none  Narrative Interpretation:   Old EKG Reviewed: none available at time of intepretation        Joya Gaskins, MD 09/25/12 1211

## 2012-09-25 NOTE — Anesthesia Preprocedure Evaluation (Addendum)
Anesthesia Evaluation  Patient identified by MRN, date of birth, ID band Patient awake    Reviewed: Allergy & Precautions, H&P , NPO status , Patient's Chart, lab work & pertinent test results  Airway Mallampati: II TM Distance: >3 FB Neck ROM: Full    Dental no notable dental hx.    Pulmonary neg pulmonary ROS,  breath sounds clear to auscultation  Pulmonary exam normal       Cardiovascular negative cardio ROS  Rhythm:Regular Rate:Normal     Neuro/Psych negative neurological ROS  negative psych ROS   GI/Hepatic negative GI ROS,   Endo/Other  negative endocrine ROS  Renal/GU negative Renal ROS  negative genitourinary   Musculoskeletal negative musculoskeletal ROS (+)   Abdominal   Peds negative pediatric ROS (+)  Hematology negative hematology ROS (+)   Anesthesia Other Findings   Reproductive/Obstetrics negative OB ROS                           Anesthesia Physical Anesthesia Plan  ASA: I  Anesthesia Plan: General   Post-op Pain Management:    Induction: Intravenous  Airway Management Planned: LMA and Oral ETT  Additional Equipment:   Intra-op Plan:   Post-operative Plan: Extubation in OR  Informed Consent: I have reviewed the patients History and Physical, chart, labs and discussed the procedure including the risks, benefits and alternatives for the proposed anesthesia with the patient or authorized representative who has indicated his/her understanding and acceptance.   Dental advisory given  Plan Discussed with: CRNA and Surgeon  Anesthesia Plan Comments:         Anesthesia Quick Evaluation

## 2012-09-25 NOTE — ED Notes (Signed)
rn used interpretor phone and obtained informed consent

## 2012-09-25 NOTE — Transfer of Care (Signed)
Immediate Anesthesia Transfer of Care Note  Patient: Sheryl [name]  Procedure(s) Performed: Procedure(s) (LRB): CYSTOSCOPY WITH RETROGRADE PYELOGRAM/URETERAL STENT PLACEMENT (Right)  Patient Location: PACU  Anesthesia Type: General  Level of Consciousness: sedated, patient cooperative and responds to stimulaton  Airway & Oxygen Therapy: Patient Spontanous Breathing and Patient connected to face mask oxgen  Post-op Assessment: Report given to PACU RN and Post -op Vital signs reviewed and stable  Post vital signs: Reviewed and stable  Complications: No apparent anesthesia complications

## 2012-09-25 NOTE — Progress Notes (Signed)
pacu nursing - dr. Berneice Heinrich in to speak with patient and family

## 2012-09-25 NOTE — ED Notes (Addendum)
Per ems pt c/o of RUQ abdominal pain x3 days. Hx of liver biospy. Pain increased over night 9/10. Denies N/V/D. Last bowel movement last night. increased frequency in urination, denies odor. C/o of being cold. Pt speaks little [place], family at bedside translating. Pt speaks vietnamese.

## 2012-09-25 NOTE — Op Note (Signed)
Sheryl [name], Sheryl [name] NO.:  [id number]  MEDICAL RECORD NO.:  [id number]  LOCATION:  [facility]                         FACILITY:  [facility]  PHYSICIAN:  Sebastian Ache, MD     DATE OF BIRTH:  08/31/1970  DATE OF PROCEDURE: 09/25/2012 DATE OF DISCHARGE:                              OPERATIVE REPORT   DIAGNOSES:  Right ureteral stone, urosepsis.  PROCEDURE: 1. Cystoscopy with right retrograde pyelogram interpretation 2. Insertion of right ureteral stent 6 x 24, no tether.  COMPLICATIONS:  None.  SPECIMENS:  None.  FINDINGS: 1. Mild diffuse bladder erythema consistent with cystitis. 2. Tortuous right ureter with filling defect in the proximal third     consistent with known stone. 3. Copious purulent appearing urine, efflux seen through and around     the distal end of the stent following placement.  INDICATIONS:  Sheryl [name] is a pleasant 42 year old [place] lady with no prior history of nephrolithiasis who presented with a 1 week prodrome of malaise, colicky right abdominal pain, that was acutely worse in the past 24 hours.  This was accompanied with nausea and vomiting as well as malaise and subjective fever.  She was evaluated in the emergency room of [facility] where she was found to have a right proximal ureteral 8 mm stone with hydronephrosis as well as infectious parameters worrisome for impending urosepsis with fever, tachycardia, and leukocytosis and felt that urgent renal decompression was warranted. Options were discussed including stenting versus nephrostomy.  She wished to proceed with stenting.  Informed consent obtained, and placed in medical record.  DESCRIPTION OF PROCEDURE:  The patient being Sheryl [name] was verified. Procedure being cystoscopy with right ureteral stent placement was confirmed.  Procedure was carried out.  Time-out was performed. Intravenous antibiotics administered.  General LMA anesthesia was introduced.  The patient was  placed into a low lithotomy position. Sterile field was created by prepping the patient's vagina, introitus, and proximal thighs using iodine x3.  Next, cystourethroscopy was performed using a 22-French rigid cystoscope with 12-degree offset lens. Inspection of the urinary bladder revealed no diverticula, calcifications, papillary lesions.  There was mild diffuse erythema which is most consistent with cystitis.  The right ureteral orifice was gently cannulated with a 6-French end-hole catheter and very gentle right retrograde pyelogram was seen.  Retrograde pyelogram demonstrated a single right ureter, single system right kidney.  There was a filling defect in the proximal third consistent with no stone.  There was significant tortuosity around this area.  A 0.038 sensor wire was navigated to the level of the stone.  At which point, it would not advance due to tortuosity and impaction as such the end-hole catheter was advanced to the same location used stabilize and straighter the ureter which then allowed for easy passage of the wire above this and easily coiled in the upper pole.  The end- hole catheter was then exchanged for a new 6 x 24 double-J stent, which was placed using cystoscopic and fluoroscopic guidance.  Good proximal and distal curl were noted.  Efflux of purulent appearing urine was seen around and through the distal end of the stent.  Bladder was emptied per cystoscope.  The patient's pressures had remained acceptable throughout the procedure, therefore no Foley catheter was placed, and she was transferred to the postanesthesia care in stable condition.          ______________________________ Sebastian Ache, MD     TM/MEDQ  D:  09/25/2012  T:  09/25/2012  Job:  [id number]

## 2012-09-25 NOTE — Progress Notes (Signed)
Patient's Bp has dropped slightly again to 81/52, HR 93. Dr. Berneice Heinrich notified and plan is to continue with current treatment plan and notify him if patient's HR rises to 110-120s. Will continue to closely monitor.  Erskin Burnet RN

## 2012-09-25 NOTE — ED Notes (Signed)
Bed: UJ81 Expected date: 09/25/12 Expected time: 7:49 AM Means of arrival: Ambulance Comments: Upper Quad Pain

## 2012-09-25 NOTE — H&P (Addendum)
Sheryl [name] is an 42 y.o. female.    Chief Complaint: Rt Ureteral Stone, Urosepsis  HPI:   1 - Rt Ureteral Stone, Urosepsis - Pt with 1 week prodrome of rt sided colicky abd pain with nausea now with fevers, chills, tachycardia, leukocytosis and CT evidence of 8mm Rt UPJ stone with proximal hydronephrosis. +nitrite, +bacteria and leukocytes in urine. No additional or prior stones. Stone 8mm, 900HU with skin-stone distance 9cm.  Only prior surgery is uncomplicated cesarean 17 years ago.  Past Medical History  Diagnosis Date  . GERD (gastroesophageal reflux disease)   . Hepatitis     Past Surgical History  Procedure Laterality Date  . Liver biopsy    . Lung surgery      2011    History reviewed. No pertinent family history. Social History:  reports that she has never smoked. She does not have any smokeless tobacco history on file. She reports that she does not drink alcohol. Her drug history is not on file.  Allergies: No Known Allergies   (Not in a hospital admission)  Results for orders placed during the hospital encounter of 09/25/12 (from the past 48 hour(s))  URINALYSIS, ROUTINE W REFLEX MICROSCOPIC     Status: Abnormal   Collection Time    09/25/12  8:22 AM      Result Value Range   Color, Urine YELLOW  YELLOW   APPearance TURBID (*) CLEAR   Specific Gravity, Urine 1.021  1.005 - 1.030   pH 6.5  5.0 - 8.0   Glucose, UA NEGATIVE  NEGATIVE mg/dL   Hgb urine dipstick MODERATE (*) NEGATIVE   Bilirubin Urine NEGATIVE  NEGATIVE   Ketones, ur NEGATIVE  NEGATIVE mg/dL   Protein, ur 30 (*) NEGATIVE mg/dL   Urobilinogen, UA 0.2  0.0 - 1.0 mg/dL   Nitrite POSITIVE (*) NEGATIVE   Leukocytes, UA LARGE (*) NEGATIVE  URINE MICROSCOPIC-ADD ON     Status: Abnormal   Collection Time    09/25/12  8:22 AM      Result Value Range   Squamous Epithelial / LPF MANY (*) RARE   WBC, UA TOO NUMEROUS TO COUNT  <3 WBC/hpf   RBC / HPF 7-10  <3 RBC/hpf   Bacteria, UA MANY (*) RARE   COMPREHENSIVE METABOLIC PANEL     Status: Abnormal   Collection Time    09/25/12  8:25 AM      Result Value Range   Sodium 133 (*) 135 - 145 mEq/L   Potassium 3.8  3.5 - 5.1 mEq/L   Chloride 101  96 - 112 mEq/L   CO2 25  19 - 32 mEq/L   Glucose, Bld 98  70 - 99 mg/dL   BUN 11  6 - 23 mg/dL   Creatinine, Ser 7.82  0.50 - 1.10 mg/dL   Calcium 9.0  8.4 - 95.6 mg/dL   Total Protein 7.2  6.0 - 8.3 g/dL   Albumin 3.0 (*) 3.5 - 5.2 g/dL   AST 33  0 - 37 U/L   ALT 12  0 - 35 U/L   Alkaline Phosphatase 90  39 - 117 U/L   Total Bilirubin 0.4  0.3 - 1.2 mg/dL   GFR calc non Af Amer >90  >90 mL/min   GFR calc Af Amer >90  >90 mL/min   Comment: (NOTE)     The eGFR has been calculated using the CKD EPI equation.     This calculation has not been validated in  all clinical situations.     eGFR's persistently <90 mL/min signify possible Chronic Kidney     Disease.  CBC WITH DIFFERENTIAL     Status: Abnormal   Collection Time    09/25/12  8:25 AM      Result Value Range   WBC 17.1 (*) 4.0 - 10.5 K/uL   RBC 3.90  3.87 - 5.11 MIL/uL   Hemoglobin 10.0 (*) 12.0 - 15.0 g/dL   HCT 16.1 (*) 09.6 - 04.5 %   MCV 80.3  78.0 - 100.0 fL   MCH 25.6 (*) 26.0 - 34.0 pg   MCHC 31.9  30.0 - 36.0 g/dL   RDW 40.9  81.1 - 91.4 %   Platelets 319  150 - 400 K/uL   Neutrophils Relative % 85 (*) 43 - 77 %   Neutro Abs 14.6 (*) 1.7 - 7.7 K/uL   Lymphocytes Relative 4 (*) 12 - 46 %   Lymphs Abs 0.7  0.7 - 4.0 K/uL   Monocytes Relative 10  3 - 12 %   Monocytes Absolute 1.8 (*) 0.1 - 1.0 K/uL   Eosinophils Relative 0  0 - 5 %   Eosinophils Absolute 0.0  0.0 - 0.7 K/uL   Basophils Relative 0  0 - 1 %   Basophils Absolute 0.0  0.0 - 0.1 K/uL  LIPASE, BLOOD     Status: None   Collection Time    09/25/12  8:25 AM      Result Value Range   Lipase 20  11 - 59 U/L  LACTIC ACID, PLASMA     Status: None   Collection Time    09/25/12  8:25 AM      Result Value Range   Lactic Acid, Venous 1.6  0.5 - 2.2 mmol/L   POCT PREGNANCY, URINE     Status: None   Collection Time    09/25/12  8:28 AM      Result Value Range   Preg Test, Ur NEGATIVE  NEGATIVE   Comment:            THE SENSITIVITY OF THIS     METHODOLOGY IS >24 mIU/mL   Ct Abdomen Pelvis Wo Contrast  09/25/2012   CLINICAL DATA:  Right upper quadrant abdominal pain. History of liver biopsy.  EXAM: CT ABDOMEN AND PELVIS WITHOUT CONTRAST  TECHNIQUE: Multidetector CT imaging of the abdomen and pelvis was performed following the standard protocol without intravenous contrast.  COMPARISON:  09/25/2012 ultrasound; CT scan of 08/27/2010  FINDINGS: Abnormal but stable multilocular cystic collection along the margin of the spleen. Despite efforts by the technologist and patient, motion artifact is present on today's exam and could not be eliminated. This reduces exam sensitivity and specificity.  The liver, pancreas, and adrenal glands appear unremarkable.  Right hydronephrosis noted secondary to a 0.8 cm right ureteropelvic junction stone.  A 5 mm hypodense lesion in the dome of the right hepatic lobe is probably a cyst but technically nonspecific. Possible punctate stone in the left kidney lower pole, somewhat obscured by motion artifact. Potential 1 mm calculus in the right kidney lower pole.  Borderline fullness of the left ovary. Uterine contour unremarkable. Urinary bladder normal. No free pelvic fluid.  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  IMPRESSION: 1.  Obstructive 8 mm right UPJ stone.  2. Suspected additional tiny punctate nonobstructive bilateral renal calculi.  3. 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  4.  Fullness of the left ovary may be incidental.  5. 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   Electronically Signed   By: Herbie Baltimore   On: 09/25/2012 11:55   US Abdomen Complete  09/25/2012   *RADIOLOGY REPORT*  Clinical Data:   Abdominal pain, hepatitis.  COMPLETE ABDOMINAL ULTRASOUND  Comparison:  06/20/2011  Findings:  Gallbladder:  No shadowing gallstones or echogenic sludge.  No gallbladder wall thickening or pericholecystic fluid.  Negative sonographic Murphy's sign according to the ultrasound technologist.  Common bile duct:  3.9 mm diameter, unremarkable.  Liver:  No focal lesion identified.  Within normal limits in parenchymal echogenicity.  IVC:  Appears normal.  Pancreas:  Pancreatic duct is dilated in the head up to 2.4 mm.  No focal lesion is identified, segments obscured by overlying bowel gas.  Spleen:  7.3 cm craniocaudal length.  There is a peripheral loculated 3.9 x 4 x 4.7 cm fluid collection which has been previously described.  Right Kidney:  There is moderate hydronephrosis, which persists on postvoid imaging.  There is a 1 x 1.1 cm cyst in the upper pole. Renal length 12.2 cm.  No solid renal lesion.  Left Kidney:  10.5 cm in length.  There is a 1.5 x 2 cm parapelvic cyst.  No solid renal lesion or hydronephrosis.  Abdominal aorta:  No aneurysm identified.  IMPRESSION:  1.  New moderate right hydronephrosis. 2.  Normal gallbladder. 3.  Persistent perisplenic loculated fluid collection.   Original Report Authenticated By: D. Andria Rhein, MD   Dg Chest Portable 1 View  09/25/2012   CLINICAL DATA:  Upper abdominal/lower chest pain.  EXAM: PORTABLE CHEST - 1 VIEW  COMPARISON:  06/11/2009  FINDINGS: The heart size and mediastinal contours are within normal limits. Both lungs are clear.  IMPRESSION: No active disease.   Electronically Signed   By: Herbie Baltimore   On: 09/25/2012 09:04    Review of Systems  Constitutional: Positive for fever, chills, malaise/fatigue and diaphoresis.  HENT: Negative.   Eyes: Negative.   Respiratory: Negative.   Cardiovascular: Negative.   Gastrointestinal: Positive for nausea and vomiting.  Genitourinary: Positive for flank pain. Negative for dysuria and urgency.   Musculoskeletal: Negative.   Skin: Negative.   Neurological: Negative.   Endo/Heme/Allergies: Negative.   Psychiatric/Behavioral: Negative.     Blood pressure 122/64, pulse 98, temperature 99.5 F (37.5 C), temperature source Oral, resp. rate 16, last menstrual period 09/19/2012, SpO2 99.00%. Physical Exam  Constitutional: She is oriented to person, place, and time. She appears well-developed and well-nourished.  [place]-speaking, family at bedside performing excellent translation. Pt understands English, but prefers family to assist as well.   HENT:  Head: Normocephalic and atraumatic.  Eyes: EOM are normal. Pupils are equal, round, and reactive to light.  Neck: Normal range of motion. Neck supple.  Cardiovascular: Normal rate and regular rhythm.   Respiratory: Effort normal and breath sounds normal.  GI: Soft. Bowel sounds are normal.  Genitourinary:  Severe RT CVAT  Musculoskeletal: Normal range of motion.  Neurological: She is alert and oriented to person, place, and time.  Skin: Skin is warm and dry.  Psychiatric: She has a normal mood and affect. Her behavior is normal. Judgment and thought content normal.     Assessment/Plan  1 - Rt Ureteral Stone, Urosepsis - Explained urgent situation with need for immediate renal decompression and admission for IV ABX until afebrile and CX final. Discussed  options for decompression including stent v. neph tube as well as rationale for staged approach with definitive stone treatment at later date after healed from infections perspective. She wants to proceed with ureteral stent today. Risks including bleeding, infection, damage to kidney / ureter / bladder, non-cure, need for furhter procedures as well as rare risks such as DVT,PE,MI,CVA,Mortality discussed.   Plan for admit post-op for IV ABX as per above. CX obtained / pending and received rocephin in ER.  Staisha Winiarski 09/25/2012, 12:48 PM   The definitino of urosepsis is sepsis  due to UTI. This is what the patient presented with.

## 2012-09-25 NOTE — Brief Op Note (Signed)
09/25/2012  2:22 PM  PATIENT:  Sheryl [name]  42 y.o. female  PRE-OPERATIVE DIAGNOSIS:  ureteral stone  POST-OPERATIVE DIAGNOSIS:  ureteral stone right side   PROCEDURE:  Procedure(s): CYSTOSCOPY WITH RETROGRADE PYELOGRAM/URETERAL STENT PLACEMENT (Right)  SURGEON:  Surgeon(s) and Role:    * Sebastian Ache, MD - Primary  PHYSICIAN ASSISTANT:   ASSISTANTS: none   ANESTHESIA:   general  EBL:     BLOOD ADMINISTERED:none  DRAINS: none   LOCAL MEDICATIONS USED:  NONE  SPECIMEN:  No Specimen  DISPOSITION OF SPECIMEN:  N/A  COUNTS:  YES  TOURNIQUET:  * No tourniquets in log *  DICTATION: .Other Dictation: Dictation Number [id number]  PLAN OF CARE: Admit to inpatient   PATIENT DISPOSITION:  PACU - hemodynamically stable.   Delay start of Pharmacological VTE agent (>24hrs) due to surgical blood loss or risk of bleeding: not applicable

## 2012-09-26 LAB — BASIC METABOLIC PANEL
BUN: 8 mg/dL (ref 6–23)
Calcium: 7.9 mg/dL — ABNORMAL LOW (ref 8.4–10.5)
GFR calc Af Amer: >90 mL/min (ref >90)
GFR calc non Af Amer: >90 mL/min (ref >90)
Glucose, Bld: 153 mg/dL — ABNORMAL HIGH (ref 70–99)
Potassium: 4 mEq/L (ref 3.5–5.1)

## 2012-09-26 LAB — CBC
HCT: 29.1 % — ABNORMAL LOW (ref 36.0–46.0)
Hemoglobin: 9.1 g/dL — ABNORMAL LOW (ref 12.0–15.0)
MCH: 25.2 pg — ABNORMAL LOW (ref 26.0–34.0)
MCHC: 31.3 g/dL (ref 30.0–36.0)
RDW: 13.9 % (ref 11.5–15.5)

## 2012-09-26 NOTE — Progress Notes (Signed)
Pt with chills and complains of feeling warm. Temp 99.6, BP 88/56, HR 109.  Dr Berneice Heinrich notified, order received.  Will continue to monitor.

## 2012-09-26 NOTE — Progress Notes (Signed)
1 Day Post-Op  Subjective: 1 - Rt Ureteral Stone, Urosepsis - s/p urgent cystoscopy with rt ureteral stent placement 09/25/12 for Rt proximal ureteral stone and urosepsis. Had  1 week prodrome of rt sided colicky abd pain with nausea now with fevers, chills, tachycardia, leukocytosis and CT evidence of 8mm Rt UPJ stone with proximal hydronephrosis. +nitrite, +bacteria and leukocytes in urine. No additional or prior stones. Stone 8mm, 900HU with skin-stone distance 9cm.  Today Sheryl [name] is feeling better, but not back to baseline. Still flairs of malaise and chills. Nausea and pain improved.   Objective: Vital signs in last 24 hours: Temp:  [97.2 F (36.2 C)-103 F (39.4 C)] 99.2 F (37.3 C) (08/24 0445) Pulse Rate:  [71-122] 78 (08/24 0445) Resp:  [12-28] 24 (08/24 0445) BP: (80-129)/(38-72) 95/51 mmHg (08/24 0445) SpO2:  [86 %-100 %] 100 % (08/24 0445) Weight:  [58.968 kg (130 lb)-60.419 kg (133 lb 3.2 oz)] 60.419 kg (133 lb 3.2 oz) (08/23 1540) Last BM Date: 09/22/12  Intake/Output from previous day: 08/23 0701 - 08/24 0700 In: 1816.7 [P.O.:120; I.V.:1696.7] Out: 650 [Urine:650] Intake/Output this shift:    General appearance: alert, cooperative and appears stated age Head: Normocephalic, without obvious abnormality, atraumatic Eyes: conjunctivae/corneas clear. PERRL, EOM's intact. Fundi benign. Ears: normal TM's and external ear canals both ears Nose: Nares normal. Septum midline. Mucosa normal. No drainage or sinus tenderness. Throat: lips, mucosa, and tongue normal; teeth and gums normal Neck: no adenopathy, no carotid bruit, no JVD, supple, symmetrical, trachea midline and thyroid not enlarged, symmetric, no tenderness/mass/nodules Back: symmetric, no curvature. ROM normal. No CVA tenderness. Resp: clear to auscultation bilaterally Chest wall: no tenderness Cardio: regular rate and rhythm, S1, S2 normal, no murmur, click, rub or gallop GI: soft, non-tender; bowel sounds normal; no  masses,  no organomegaly Female genitalia: normal Extremities: extremities normal, atraumatic, no cyanosis or edema Pulses: 2+ and symmetric Skin: Skin color, texture, turgor normal. No rashes or lesions Lymph nodes: Cervical, supraclavicular, and axillary nodes normal. Neurologic: Grossly normal  Lab Results:   Recent Labs  09/25/12 0825 09/26/12 0453  WBC 17.1* 16.0*  HGB 10.0* 9.1*  HCT 31.3* 29.1*  PLT 319 247   BMET  Recent Labs  09/25/12 0825 09/26/12 0453  NA 133* 133*  K 3.8 4.0  CL 101 103  CO2 25 24  GLUCOSE 98 153*  BUN 11 8  CREATININE 0.74 0.79  CALCIUM 9.0 7.9*   PT/INR No results found for this basename: LABPROT, INR,  in the last 72 hours ABG No results found for this basename: PHART, PCO2, PO2, HCO3,  in the last 72 hours  Studies/Results: Ct Abdomen Pelvis Wo Contrast  09/25/2012   CLINICAL DATA:  Right upper quadrant abdominal pain. History of liver biopsy.  EXAM: CT ABDOMEN AND PELVIS WITHOUT CONTRAST  TECHNIQUE: Multidetector CT imaging of the abdomen and pelvis was performed following the standard protocol without intravenous contrast.  COMPARISON:  09/25/2012 ultrasound; CT scan of 08/27/2010  FINDINGS: Abnormal but stable multilocular cystic collection along the margin of the spleen. Despite efforts by the technologist and patient, motion artifact is present on today's exam and could not be eliminated. This reduces exam sensitivity and specificity.  The liver, pancreas, and adrenal glands appear unremarkable.  Right hydronephrosis noted secondary to a 0.8 cm right ureteropelvic junction stone.  A 5 mm hypodense lesion in the dome of the right hepatic lobe is probably a cyst but technically nonspecific. Possible punctate stone in the left kidney  lower pole, somewhat obscured by motion artifact. Potential 1 mm calculus in the right kidney lower pole.  Borderline fullness of the left ovary. Uterine contour unremarkable. Urinary bladder normal. No free  pelvic fluid.  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  IMPRESSION: 1.  Obstructive 8 mm right UPJ stone.  2. Suspected additional tiny punctate nonobstructive bilateral renal calculi.  3. 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  4.  Fullness of the left ovary may be incidental.  5. 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   Electronically Signed   By: Herbie Baltimore   On: 09/25/2012 11:55   US Abdomen Complete  09/25/2012   *RADIOLOGY REPORT*  Clinical Data:  Abdominal pain, hepatitis.  COMPLETE ABDOMINAL ULTRASOUND  Comparison:  06/20/2011  Findings:  Gallbladder:  No shadowing gallstones or echogenic sludge.  No gallbladder wall thickening or pericholecystic fluid.  Negative sonographic Murphy's sign according to the ultrasound technologist.  Common bile duct:  3.9 mm diameter, unremarkable.  Liver:  No focal lesion identified.  Within normal limits in parenchymal echogenicity.  IVC:  Appears normal.  Pancreas:  Pancreatic duct is dilated in the head up to 2.4 mm.  No focal lesion is identified, segments obscured by overlying bowel gas.  Spleen:  7.3 cm craniocaudal length.  There is a peripheral loculated 3.9 x 4 x 4.7 cm fluid collection which has been previously described.  Right Kidney:  There is moderate hydronephrosis, which persists on postvoid imaging.  There is a 1 x 1.1 cm cyst in the upper pole. Renal length 12.2 cm.  No solid renal lesion.  Left Kidney:  10.5 cm in length.  There is a 1.5 x 2 cm parapelvic cyst.  No solid renal lesion or hydronephrosis.  Abdominal aorta:  No aneurysm identified.  IMPRESSION:  1.  New moderate right hydronephrosis. 2.  Normal gallbladder. 3.  Persistent perisplenic loculated fluid collection.   Original Report Authenticated By: D. Andria Rhein, MD   Dg Chest Portable 1 View  09/25/2012   CLINICAL DATA:  Upper abdominal/lower chest pain.  EXAM: PORTABLE  CHEST - 1 VIEW  COMPARISON:  06/11/2009  FINDINGS: The heart size and mediastinal contours are within normal limits. Both lungs are clear.  IMPRESSION: No active disease.   Electronically Signed   By: Herbie Baltimore   On: 09/25/2012 09:04    Anti-infectives: Anti-infectives   Start     Dose/Rate Route Frequency Ordered Stop   09/26/12 1000  cefTRIAXone (ROCEPHIN) 1 g in dextrose 5 % 50 mL IVPB     1 g 100 mL/hr over 30 Minutes Intravenous Every 24 hours 09/25/12 1552     09/25/12 1600  cefTRIAXone (ROCEPHIN) 1 g in dextrose 5 % 50 mL IVPB  Status:  Discontinued     1 g 100 mL/hr over 30 Minutes Intravenous Every 24 hours 09/25/12 1547 09/25/12 1552   09/25/12 1330  [MAR Hold]  gentamicin (GARAMYCIN) 295.2 mg in dextrose 5 % 100 mL IVPB     (On MAR Hold since 09/25/12 1356)   5 mg/kg  59 kg 107.4 mL/hr over 60 Minutes Intravenous STAT 09/25/12 1258 09/25/12 1407   09/25/12 1115  cefTRIAXone (ROCEPHIN) 1 g in dextrose 5 % 50 mL IVPB     1 g 100 mL/hr over 30 Minutes Intravenous  Once 09/25/12 1051 09/25/12 1138   09/25/12 1045  cefTRIAXone (ROCEPHIN) 100 mg/kg in dextrose  5 % 50 mL IVPB  Status:  Discontinued     100 mg/kg 100 mL/hr over 30 Minutes Intravenous  Once 09/25/12 1039 09/25/12 1051      Assessment/Plan:  1 - Rt Ureteral Stone, Urosepsis - Improving s/p renal decompression with stent. Fever curve trending down, CX's pending. Remain on IVF and in house until CX final and afebrile x24 hours. Remain on Rocephin for now.  [facility], Luetta Piazza 09/26/2012

## 2012-09-27 ENCOUNTER — Encounter (HOSPITAL_COMMUNITY): Payer: Self-pay | Admitting: Urology

## 2012-09-27 MED ORDER — ENSURE COMPLETE PO LIQD: 237.0000 mL | ORAL | Status: DC

## 2012-09-27 MED ORDER — TRAMADOL HCL 50 MG PO TABS
50.0000 mg | ORAL_TABLET | Freq: Four times a day (QID) | ORAL | Status: DC | PRN
Start: 2012-09-27 — End: 2012-10-15

## 2012-09-27 MED ORDER — BOOST / RESOURCE BREEZE PO LIQD: 1.0000 | ORAL | Status: DC

## 2012-09-27 MED ORDER — CIPROFLOXACIN HCL 500 MG PO TABS: 500.0000 mg | ORAL_TABLET | Freq: Two times a day (BID) | ORAL | Status: DC

## 2012-09-27 NOTE — Progress Notes (Signed)
Pt has been nauseated and feeling poorly all shift with pain alternating from her head to her back.  Have been medicating her with Zofran and Dilaudid with some relief.  Her blood pressure this am is 117/57  And temp has come down to 97.9.   Urine has been clear yellow.

## 2012-09-27 NOTE — Progress Notes (Signed)
Patient discharged, interpreter used at the time. Patients daughter also at bedside during discharge.  Patient verbalized understanding of instructions with interpreter.

## 2012-09-27 NOTE — Discharge Summary (Signed)
Physician Discharge Summary  Patient ID: Sheryl [name] MRN: [id number] DOB/AGE: 04/28/1970 42 y.o.  Admit date: 09/25/2012 Discharge date: 09/27/2012  Admission Diagnoses: Rt Ureteral Stone, Urosepsis  Discharge Diagnoses: Rt Ureteral Stone, Urosepsis   Discharged Condition: good  Hospital Course:   1 - Rt Ureteral Stone, Urosepsis - s/p urgent cystoscopy with rt ureteral stent placement 09/25/12 for Rt proximal ureteral stone and urosepsis. Had 1 week prodrome of rt sided colicky abd pain with nausea now with fevers, chills, tachycardia, leukocytosis and CT evidence of 8mm Rt UPJ stone with proximal hydronephrosis. +nitrite, +bacteria and leukocytes in urine. No additional or prior stones. Stone 8mm, 900HU with skin-stone distance 9cm. Most recent UCX e. Coli sens to cipro, repeat UCX 8/23 also with e.coli.  By 8/25, the day of discharge. Pt's nausea improved, afebrile x 24 hours, and felt to be adequate for discharge.   Consults: None  Significant Diagnostic Studies: retrograde pyelogram per OP note 8/23  Treatments: IV hydration and antibiotics: ceftriaxone  Discharge Exam: Blood pressure 110/57, pulse 81, temperature 98 F (36.7 C), temperature source Oral, resp. rate 18, height 5\' 3"  (1.6 m), weight 60.419 kg (133 lb 3.2 oz), last menstrual period 09/19/2012, SpO2 100.00%. General appearance: alert, cooperative, appears stated age and family at bedside. Head: Normocephalic, without obvious abnormality, atraumatic Eyes: conjunctivae/corneas clear. PERRL, EOM's intact. Fundi benign. Ears: normal TM's and external ear canals both ears Nose: Nares normal. Septum midline. Mucosa normal. No drainage or sinus tenderness. Throat: lips, mucosa, and tongue normal; teeth and gums normal Neck: no adenopathy, no carotid bruit, no JVD, supple, symmetrical, trachea midline and thyroid not enlarged, symmetric, no tenderness/mass/nodules Back: symmetric, no curvature. ROM normal. No CVA  tenderness. Resp: clear to auscultation bilaterally Chest wall: no tenderness Cardio: regular rate and rhythm, S1, S2 normal, no murmur, click, rub or gallop GI: soft, non-tender; bowel sounds normal; no masses,  no organomegaly Extremities: extremities normal, atraumatic, no cyanosis or edema Pulses: 2+ and symmetric Skin: Skin color, texture, turgor normal. No rashes or lesions Lymph nodes: Cervical, supraclavicular, and axillary nodes normal. Neurologic: Grossly normal  Disposition: 01-Home or Self Care     Medication List         ciprofloxacin 500 MG tablet  Commonly known as:  CIPRO  Take 1 tablet (500 mg total) by mouth 2 (two) times daily. X 5 days now  for urinary infection. Also begin 3 days before next surgery.     traMADol 50 MG tablet  Commonly known as:  ULTRAM  Take 1 tablet (50 mg total) by mouth every 6 (six) hours as needed for pain.           Follow-up Information   Follow up with Sebastian Ache, MD. (We will call you to arrange date for next surgery to remove kidney stone in several weeks.)    Specialty:  Urology   Contact information:   [address] [place] [address] [phone]       Signed: Sebastian Ache 09/27/2012, 4:48 PM

## 2012-09-27 NOTE — Progress Notes (Signed)
INITIAL NUTRITION ASSESSMENT  DOCUMENTATION CODES Per approved criteria  -Not Applicable   INTERVENTION: Provide Ensure Complete once daily Provide Resource Breeze once daily Encourage PO intake as tolerated  NUTRITION DIAGNOSIS: Inadequate oral intake related to pain and decreased appetite as evidenced by pt's report and eating 10-20% of meals per nursing notes.  Goal: Pt to meet >/= 90% of their estimated nutrition needs  Monitor:  PO intake Weight Labs  Reason for Assessment: Malnutrition Screening Tool, score of 3  42 y.o. female  Admitting Dx: Rt Ureteral Stone, Urosepsis   ASSESSMENT: Pt with 1 week  of rt sided colicky abd pain with nausea now with fevers, chills, tachycardia, leukocytosis and CT evidence of 8mm Rt UPJ stone with proximal hydronephrosis. +nitrite, +bacteria and leukocytes in urine.  Pt complained of not feeling well at time of visit- reported pain and feeling very cold despite having several blankets. Pt states she has been eating a little today but, less than usual. She reports her usual body weight to be between 130 and 135 lbs. Per nursing notes, pt only consuming 10-20% of meals today.  Height: Ht Readings from Last 1 Encounters:  09/25/12 5\' 3"  (1.6 m)    Weight: Wt Readings from Last 1 Encounters:  09/25/12 133 lb 3.2 oz (60.419 kg)    Ideal Body Weight: 115 lbs  % Ideal Body Weight: 116%  Wt Readings from Last 10 Encounters:  09/25/12 133 lb 3.2 oz (60.419 kg)  09/25/12 133 lb 3.2 oz (60.419 kg)    Usual Body Weight: 130-135 lbs  % Usual Body Weight: 100%  BMI:  Body mass index is 23.6 kg/(m^2).  Estimated Nutritional Needs: Kcal: 1600-1800 Protein: 60-70 grams Fluid: 2.1 L/day  Skin: incision of perineum  Diet Order: General  EDUCATION NEEDS: -No education needs identified at this time   Intake/Output Summary (Last 24 hours) at 09/27/12 1535 Last data filed at 09/27/12 1300  Gross per 24 hour  Intake 3781.25 ml   Output   2000 ml  Net 1781.25 ml    Last BM: 8/24   Labs:   Recent Labs Lab 09/25/12 0825 09/26/12 0453  NA 133* 133*  K 3.8 4.0  CL 101 103  CO2 25 24  BUN 11 8  CREATININE 0.74 0.79  CALCIUM 9.0 7.9*  GLUCOSE 98 153*    CBG (last 3)  No results found for this basename: GLUCAP,  in the last 72 hours  Scheduled Meds: . cefTRIAXone (ROCEPHIN)  IV  1 g Intravenous Q24H  . docusate sodium  100 mg Oral BID  . senna  1 tablet Oral BID    Continuous Infusions: . dextrose 5 % and 0.45 % NaCl with KCl 20 mEq/L 125 mL/hr at 09/26/12 1438    Past Medical History  Diagnosis Date  . GERD (gastroesophageal reflux disease)   . Hepatitis     Past Surgical History  Procedure Laterality Date  . Liver biopsy    . Lung surgery      2011    Ian Malkin RD, LDN Inpatient Clinical Dietitian Pager: [phone] After Hours Pager: [phone]

## 2012-09-27 NOTE — Clinical Documentation Improvement (Signed)
THIS DOCUMENT IS NOT A PERMANENT PART OF THE MEDICAL RECORD  Please update your documentation with the medical record to reflect your response to this query. If you need help knowing how to do this please call [phone].  09/27/12  Dear Dr.T [facility],   The Physician must have documented urosepsis and the patient must have extensive clinical indicators of a generalized sepsis present prior to querying.  By extensive clinical indicators, it is meant that the physician has documented two or more of the clinical indicators outlined below, therefore substantially describing the clinical condition about which the coder will inquire but - not having made the specific or particular diagnosis.  The medical record reflects the following clinical findings: 09/26/12: progr's note..."Rt Ureteral Stone, Urosepsis - Improving s/p renal decompression with stent. Fever curve trending down, CX's pending. Remain on IVF and in house until CX final and afebrile x24 hours. Remain on Rocephin for now."  For accurate Dx specificity & severity can noted "urosepsis" be further clarified. Thank you  1. Please clarify the specific condition indicated by the term 'urosepsis'.  Sepsis (due to UTI)  UTI  Other (please specify)  Unable to determine  Unknown 2. Please clarify if condition is secondary to a device (please specify device) 3. Please specify associated organism  Clinical Indicators: Risk Factors: See Above note  Signs & Symptoms: 09/26/12 progr's Note: Vital signs in last 24 hours: Temp:  [97.2 F (36.2 C)-103 F (39.4 C)] 99.2 F (37.3 C) (08/24 0445) Pulse Rate:  [71-122] 78 (08/24 0445) Resp:  [12-28] 24 (08/24 0445) BP: (80-129)/(38-72) 95/51 mmHg (08/24 0445) SpO2:  [86 %-100 %] 100 % (08/24 0445) "Had  1 week prodrome of rt sided colicky abd pain with nausea now with fevers, chills, tachycardia, leukocytosis and CT evidence of 8mm Rt UPJ stone with proximal hydronephrosis.  +nitrite, +bacteria and leukocytes in urine."  Treament: See Above Note  When using the term "urosepsis" what do you mean?  Based on the clinical findings above and your medical judgement, can you please update your documentation within the medical record if deemed appropriate.  Reviewed: additional documentation in the MEDICAL RECORD NUMBER9/2/14 addendum added to H&P for [facility] capt. orm      Thank You,  Toribio Harbour, RN, BSN, CCDS Certified Clinical Documentation Specialist Pager: [phone] Health Information Management [place]

## 2012-09-28 ENCOUNTER — Other Ambulatory Visit: Payer: Self-pay | Admitting: Urology

## 2012-09-28 LAB — URINE CULTURE

## 2012-09-28 NOTE — Anesthesia Postprocedure Evaluation (Signed)
  Anesthesia Post-op Note  Patient: Sheryl [name]  Procedure(s) Performed: Procedure(s) (LRB): CYSTOSCOPY WITH RETROGRADE PYELOGRAM/URETERAL STENT PLACEMENT (Right)  Patient Location: PACU  Anesthesia Type: General  Level of Consciousness: awake and alert   Airway and Oxygen Therapy: Patient Spontanous Breathing  Post-op Pain: mild  Post-op Assessment: Post-op Vital signs reviewed, Patient's Cardiovascular Status Stable, Respiratory Function Stable, Patent Airway and No signs of Nausea or vomiting  Last Vitals:  Filed Vitals:   09/27/12 1335  BP: 110/57  Pulse: 81  Temp: 36.7 C  Resp: 18    Post-op Vital Signs: stable   Complications: No apparent anesthesia complications

## 2012-10-05 ENCOUNTER — Encounter (HOSPITAL_BASED_OUTPATIENT_CLINIC_OR_DEPARTMENT_OTHER): Payer: Self-pay | Admitting: *Deleted

## 2012-10-13 ENCOUNTER — Encounter (HOSPITAL_BASED_OUTPATIENT_CLINIC_OR_DEPARTMENT_OTHER): Payer: Self-pay | Admitting: *Deleted

## 2012-10-13 NOTE — Progress Notes (Signed)
Spoke w pt via [employer] , # [phone]. Hx obtained.  Pt instructed npo p mn 9/11 x pain med if needed w sip of water.  Pt to call back w name of meds and dosages.  To [facility] 9/12 @ 0830.  Needs istat 8 and urine hcg on arrival. Interpreter requested via case managemnt. ([phone]).  Spoke w/ Sunday Spillers.  Interpreter to arrive at 0815 and stay until 1000. Then return at 1100 and stay until 1 pm or longer if needed.

## 2012-10-15 ENCOUNTER — Ambulatory Visit (HOSPITAL_BASED_OUTPATIENT_CLINIC_OR_DEPARTMENT_OTHER): Payer: Medicaid Other | Admitting: Anesthesiology

## 2012-10-15 ENCOUNTER — Ambulatory Visit (HOSPITAL_BASED_OUTPATIENT_CLINIC_OR_DEPARTMENT_OTHER)
Admission: RE | Admit: 2012-10-15 | Discharge: 2012-10-15 | Disposition: A | Discharge status: Home / Self Care | Payer: Medicaid Other | Source: Ambulatory Visit | Attending: Urology | Admitting: Urology

## 2012-10-15 ENCOUNTER — Encounter (HOSPITAL_BASED_OUTPATIENT_CLINIC_OR_DEPARTMENT_OTHER): Payer: Self-pay | Admitting: Anesthesiology

## 2012-10-15 ENCOUNTER — Encounter (HOSPITAL_BASED_OUTPATIENT_CLINIC_OR_DEPARTMENT_OTHER): Payer: Self-pay | Admitting: *Deleted

## 2012-10-15 ENCOUNTER — Ambulatory Visit (HOSPITAL_COMMUNITY): Payer: Medicaid Other

## 2012-10-15 ENCOUNTER — Encounter (HOSPITAL_BASED_OUTPATIENT_CLINIC_OR_DEPARTMENT_OTHER)
Admission: RE | Disposition: A | Discharge status: Home / Self Care | Payer: Self-pay | Source: Ambulatory Visit | Attending: Urology

## 2012-10-15 DIAGNOSIS — N201 Calculus of ureter: Secondary | ICD-10-CM | POA: Insufficient documentation

## 2012-10-15 DIAGNOSIS — K219 Gastro-esophageal reflux disease without esophagitis: Secondary | ICD-10-CM | POA: Insufficient documentation

## 2012-10-15 DIAGNOSIS — N200 Calculus of kidney: Secondary | ICD-10-CM | POA: Insufficient documentation

## 2012-10-15 DIAGNOSIS — Z87440 Personal history of urinary (tract) infections: Secondary | ICD-10-CM | POA: Insufficient documentation

## 2012-10-15 HISTORY — DX: Myoneural disorder, unspecified: G70.9

## 2012-10-15 HISTORY — PX: HOLMIUM LASER APPLICATION: SHX5852

## 2012-10-15 HISTORY — DX: Urinary tract infection, site not specified: N39.0

## 2012-10-15 HISTORY — DX: Sepsis, unspecified organism: A41.9

## 2012-10-15 HISTORY — DX: Pneumonia, unspecified organism: J18.9

## 2012-10-15 HISTORY — DX: Unspecified hydronephrosis: N13.30

## 2012-10-15 HISTORY — DX: Calculus of ureter: N20.1

## 2012-10-15 HISTORY — DX: Fever, unspecified: R50.9

## 2012-10-15 HISTORY — DX: Urethral syndrome, unspecified: N34.3

## 2012-10-15 HISTORY — DX: Reserved for concepts with insufficient information to code with codable children: IMO0002

## 2012-10-15 HISTORY — DX: Personal history of other medical treatment: Z92.89

## 2012-10-15 HISTORY — PX: CYSTOSCOPY WITH RETROGRADE PYELOGRAM, URETEROSCOPY AND STENT PLACEMENT: SHX5789

## 2012-10-15 LAB — POCT I-STAT, CHEM 8
Calcium, Ion: 0.88 mmol/L — ABNORMAL LOW (ref 1.12–1.23)
Glucose, Bld: 81 mg/dL (ref 70–99)
HCT: 35 % — ABNORMAL LOW (ref 36.0–46.0)
Hemoglobin: 11.9 g/dL — ABNORMAL LOW (ref 12.0–15.0)
TCO2: 18 mmol/L (ref 0–100)

## 2012-10-15 LAB — POCT PREGNANCY, URINE: Preg Test, Ur: NEGATIVE

## 2012-10-15 SURGERY — CYSTOURETEROSCOPY, WITH RETROGRADE PYELOGRAM AND STENT INSERTION
Anesthesia: General | Site: Ureter | Laterality: Right | Wound class: Clean Contaminated

## 2012-10-15 MED ORDER — PROPOFOL 10 MG/ML IV BOLUS
INTRAVENOUS | Status: DC | PRN
  Administered 2012-10-15: 180 mg via INTRAVENOUS

## 2012-10-15 MED ORDER — MIDAZOLAM HCL 5 MG/5ML IJ SOLN
INTRAMUSCULAR | Status: DC | PRN
  Administered 2012-10-15: 2 mg via INTRAVENOUS

## 2012-10-15 MED ORDER — PROMETHAZINE HCL 25 MG/ML IJ SOLN
6.2500 mg | INTRAMUSCULAR | Status: DC | PRN
  Filled 2012-10-15: qty 1

## 2012-10-15 MED ORDER — LACTATED RINGERS IV SOLN
INTRAVENOUS | Status: DC
  Filled 2012-10-15: qty 1000

## 2012-10-15 MED ORDER — LIDOCAINE HCL (CARDIAC) 20 MG/ML IV SOLN
INTRAVENOUS | Status: DC | PRN
  Administered 2012-10-15: 50 mg via INTRAVENOUS

## 2012-10-15 MED ORDER — STERILE WATER FOR IRRIGATION IR SOLN
Status: DC | PRN
  Administered 2012-10-15: 1000 mL

## 2012-10-15 MED ORDER — FENTANYL CITRATE 0.05 MG/ML IJ SOLN
25.0000 ug | INTRAMUSCULAR | Status: DC | PRN
  Filled 2012-10-15: qty 1

## 2012-10-15 MED ORDER — IOHEXOL 350 MG/ML SOLN
INTRAVENOUS | Status: DC | PRN
  Administered 2012-10-15: 9 mL

## 2012-10-15 MED ORDER — CIPROFLOXACIN HCL 500 MG PO TABS: 500.0000 mg | ORAL_TABLET | Freq: Two times a day (BID) | ORAL | Status: DC

## 2012-10-15 MED ORDER — ONDANSETRON HCL 4 MG/2ML IJ SOLN
INTRAMUSCULAR | Status: DC | PRN
  Administered 2012-10-15: 4 mg via INTRAVENOUS

## 2012-10-15 MED ORDER — SODIUM CHLORIDE 0.9 % IR SOLN
Status: DC | PRN
  Administered 2012-10-15: 6000 mL

## 2012-10-15 MED ORDER — DEXAMETHASONE SODIUM PHOSPHATE 4 MG/ML IJ SOLN
INTRAMUSCULAR | Status: DC | PRN
  Administered 2012-10-15: 8 mg via INTRAVENOUS

## 2012-10-15 MED ORDER — FENTANYL CITRATE 0.05 MG/ML IJ SOLN
INTRAMUSCULAR | Status: DC | PRN
  Administered 2012-10-15 (×2): 50 ug via INTRAVENOUS

## 2012-10-15 MED ORDER — TRAMADOL HCL 50 MG PO TABS: 50.0000 mg | ORAL_TABLET | Freq: Four times a day (QID) | ORAL | Status: DC | PRN

## 2012-10-15 MED ORDER — LACTATED RINGERS IV SOLN
INTRAVENOUS | Status: DC
  Administered 2012-10-15 (×2): via INTRAVENOUS
  Filled 2012-10-15: qty 1000

## 2012-10-15 MED ORDER — GENTAMICIN SULFATE 40 MG/ML IJ SOLN
5.0000 mg/kg | INTRAVENOUS | Status: DC
  Administered 2012-10-15: 280 mg via INTRAVENOUS
  Filled 2012-10-15: qty 7.09

## 2012-10-15 SURGICAL SUPPLY — 34 items
BAG DRAIN URO-CYSTO SKYTR STRL (DRAIN) ×2 IMPLANT
BASKET LASER NITINOL 1.9FR (BASKET) ×2 IMPLANT
BASKET STNLS GEMINI 4WIRE 3FR (BASKET) IMPLANT
BASKET ZERO TIP NITINOL 2.4FR (BASKET) IMPLANT
BRUSH URET BIOPSY 3F (UROLOGICAL SUPPLIES) IMPLANT
CANISTER SUCT LVC 12 LTR MEDI- (MISCELLANEOUS) ×2 IMPLANT
CATH INTERMIT  6FR 70CM (CATHETERS) ×2 IMPLANT
CATH URET 5FR 28IN CONE TIP (BALLOONS)
CATH URET 5FR 28IN OPEN ENDED (CATHETERS) IMPLANT
CATH URET 5FR 70CM CONE TIP (BALLOONS) IMPLANT
CLOTH BEACON ORANGE TIMEOUT ST (SAFETY) ×2 IMPLANT
DRAPE CAMERA CLOSED 9X96 (DRAPES) ×2 IMPLANT
ELECT REM PT RETURN 9FT ADLT (ELECTROSURGICAL)
ELECTRODE REM PT RTRN 9FT ADLT (ELECTROSURGICAL) IMPLANT
GLOVE BIO SURGEON STRL SZ7.5 (GLOVE) ×4 IMPLANT
GOWN PREVENTION PLUS LG XLONG (DISPOSABLE) ×4 IMPLANT
GOWN STRL REIN XL XLG (GOWN DISPOSABLE) ×2 IMPLANT
GUIDEWIRE 0.038 PTFE COATED (WIRE) IMPLANT
GUIDEWIRE ANG ZIPWIRE 038X150 (WIRE) ×2 IMPLANT
GUIDEWIRE STR DUAL SENSOR (WIRE) ×2 IMPLANT
IV NS IRRIG 3000ML ARTHROMATIC (IV SOLUTION) ×4 IMPLANT
KIT BALLIN UROMAX 15FX10 (LABEL) IMPLANT
KIT BALLN UROMAX 15FX4 (MISCELLANEOUS) IMPLANT
KIT BALLN UROMAX 26 75X4 (MISCELLANEOUS)
LASER FIBER DISP (UROLOGICAL SUPPLIES) IMPLANT
PACK CYSTOSCOPY (CUSTOM PROCEDURE TRAY) ×2 IMPLANT
SET HIGH PRES BAL DIL (LABEL)
SHEATH ACCESS URETERAL 24CM (SHEATH) ×2 IMPLANT
SHEATH URET ACCESS 12FR/35CM (UROLOGICAL SUPPLIES) IMPLANT
SHEATH URET ACCESS 12FR/55CM (UROLOGICAL SUPPLIES) IMPLANT
STENT URET 6FRX24 CONTOUR (STENTS) ×2 IMPLANT
SYRINGE 10CC LL (SYRINGE) ×2 IMPLANT
SYRINGE IRR TOOMEY STRL 70CC (SYRINGE) IMPLANT
TUBE FEEDING 8FR 16IN STR KANG (MISCELLANEOUS) ×2 IMPLANT

## 2012-10-15 NOTE — Anesthesia Postprocedure Evaluation (Signed)
  Anesthesia Post-op Note  Patient: Sheryl [name]  Procedure(s) Performed: Procedure(s) (LRB): CYSTOSCOPY WITH RETROGRADE PYELOGRAM, URETEROSCOPY AND REMOVAL STENT WITH  STENT PLACEMENT (Right) HOLMIUM LASER APPLICATION (Right)  Patient Location: PACU  Anesthesia Type: General  Level of Consciousness: awake and alert   Airway and Oxygen Therapy: Patient Spontanous Breathing  Post-op Pain: mild  Post-op Assessment: Post-op Vital signs reviewed, Patient's Cardiovascular Status Stable, Respiratory Function Stable, Patent Airway and No signs of Nausea or vomiting  Last Vitals:  Filed Vitals:   10/15/12 1408  BP: 98/56  Pulse: 72  Temp:   Resp: 15    Post-op Vital Signs: stable   Complications: No apparent anesthesia complications

## 2012-10-15 NOTE — H&P (Signed)
Sheryl [name] is an 42 y.o. female.    Chief Complaint: Pre-Op Rt Ureteroscopic Stone Manipulation  HPI:  1 - Rt Ureteral Stone, history of Urosepsis - s/p urgent Rt ureteral stent placement 09/25/12 for  fevers, chills, tachycardia, leukocytosis and CT evidence of 8mm Rt UPJ stone with proximal hydronephrosis. +nitrite, +bacteria and leukocytes in urine. No additional or prior stones. Stone 8mm, 900HU with skin-stone distance 9cm. Final UCX at time with e. Coli sens to Cipro which she completed. No interval fevers  Today Chevette is seen to proceed with definitive treatment of her rt sided stone with ureteroscopy. She has been on Cipro peri-op again based on most recent UCX.   Only prior surgery is uncomplicated cesarean 17 years ago.   Past Medical History  Diagnosis Date  . GERD (gastroesophageal reflux disease)   . Hepatitis   . History of positive PPD     DX 2011--  CXR DONE NO EVIDENCE  . Hydronephrosis, right   . Right ureteral stone   . History of ureter stent   . Chills with fever     intermittently since d/c from hospital  . Neuromuscular disorder     legs numb intermittently  . Dysuria-frequency syndrome     w/ pink urine  . Urosepsis 8/14    admitted to [facility]    Past Surgical History  Procedure Laterality Date  . Cystoscopy w/ ureteral stent placement Right 09/25/2012    Procedure: CYSTOSCOPY WITH RETROGRADE PYELOGRAM/URETERAL STENT PLACEMENT;  Surgeon: Sebastian Ache, MD;  Location: [facility];  Service: Urology;  Laterality: Right;  . Liver biopsy    . Right vats w/ drainage peural effusion and bx's  10-30-2008  . Removal of uterine cyst      years ago    History reviewed. No pertinent family history. Social History:  reports that she has never smoked. She does not have any smokeless tobacco history on file. She reports that she does not drink alcohol. Her drug history is not on file.  Allergies: No Known Allergies  No prescriptions prior to admission    No results  found for this or any previous visit (from the past 48 hour(s)). No results found.  Review of Systems  Constitutional: Negative.  Negative for fever and chills.  HENT: Negative.   Eyes: Negative.   Respiratory: Negative.   Cardiovascular: Negative.   Gastrointestinal: Negative.   Genitourinary: Positive for urgency and frequency. Negative for flank pain.       Mild stent colic as expected  Musculoskeletal: Negative.   Skin: Negative.   Neurological: Negative.   Endo/Heme/Allergies: Negative.   Psychiatric/Behavioral: Negative.     Height 5\' 3"  (1.6 m), weight 56 kg (123 lb 7.3 oz), last menstrual period 09/19/2012. Physical Exam  Constitutional: She is oriented to person, place, and time. She appears well-developed and well-nourished.  [place] speaking only. Daughter and family acting as Nurse, learning disability.  HENT:  Head: Normocephalic and atraumatic.  Eyes: EOM are normal. Pupils are equal, round, and reactive to light.  Neck: Normal range of motion. Neck supple.  Cardiovascular: Normal rate and regular rhythm.   Respiratory: Effort normal and breath sounds normal.  GI: Soft. Bowel sounds are normal.  Genitourinary:  No CVAT  Musculoskeletal: Normal range of motion.  Neurological: She is alert and oriented to person, place, and time.  Skin: Skin is warm and dry.  Psychiatric: She has a normal mood and affect. Her behavior is normal. Judgment and thought content normal.  Assessment/Plan 1 - Rt Ureteral Stone, history of Urosepsis - Infectious parameters now clinically resolved. Will proceed as planned today with elective ureteroscopic stone manipulation.  We rediscussed ureteroscopic stone manipulation with basketing and laser-lithotripsy in detail.  We rediscussed risks including bleeding, infection, damage to kidney / ureter  bladder, rarely loss of kidney. We rediscussed anesthetic risks and rare but serious surgical complications including DVT, PE, MI, and mortality. We  specifically readdressed that in 5-10% of cases a staged approach is required with stenting followed by re-attempt ureteroscopy if anatomy unfavorable. The patient voiced understanding and wishes to proceed.    Sandford Diop 10/15/2012, 4:57 AM

## 2012-10-15 NOTE — Anesthesia Preprocedure Evaluation (Addendum)
Anesthesia Evaluation  Patient identified by MRN, date of birth, ID band Patient awake    Reviewed: Allergy & Precautions, H&P , NPO status , Patient's Chart, lab work & pertinent test results  Airway Mallampati: II TM Distance: >3 FB Neck ROM: Full    Dental no notable dental hx. (+) Teeth Intact, Dental Advisory Given and Caps,    Pulmonary neg pulmonary ROS, pneumonia -, resolved,  breath sounds clear to auscultation  Pulmonary exam normal       Cardiovascular negative cardio ROS  Rhythm:Regular Rate:Normal     Neuro/Psych  Neuromuscular disease negative neurological ROS  negative psych ROS   GI/Hepatic GERD-  ,(+) Hepatitis -  Endo/Other  negative endocrine ROS  Renal/GU Renal disease  negative genitourinary   Musculoskeletal negative musculoskeletal ROS (+)   Abdominal   Peds  Hematology negative hematology ROS (+)   Anesthesia Other Findings   Reproductive/Obstetrics negative OB ROS                          Anesthesia Physical Anesthesia Plan  ASA: II  Anesthesia Plan: General   Post-op Pain Management:    Induction: Intravenous  Airway Management Planned: LMA  Additional Equipment:   Intra-op Plan:   Post-operative Plan: Extubation in OR  Informed Consent: I have reviewed the patients History and Physical, chart, labs and discussed the procedure including the risks, benefits and alternatives for the proposed anesthesia with the patient or authorized representative who has indicated his/her understanding and acceptance.   Dental advisory given  Plan Discussed with: CRNA  Anesthesia Plan Comments:         Anesthesia Quick Evaluation

## 2012-10-15 NOTE — Transfer of Care (Signed)
Immediate Anesthesia Transfer of Care Note  Patient: Sheryl [name]  Procedure(s) Performed: Procedure(s) (LRB): CYSTOSCOPY WITH RETROGRADE PYELOGRAM, URETEROSCOPY AND REMOVAL STENT WITH  STENT PLACEMENT (Right) HOLMIUM LASER APPLICATION (Right)  Patient Location: PACU  Anesthesia Type: General  Level of Consciousness: awake, oriented, sedated and patient cooperative  Airway & Oxygen Therapy: Patient Spontanous Breathing and Patient connected to face mask oxygen  Post-op Assessment: Report given to PACU RN and Post -op Vital signs reviewed and stable  Post vital signs: Reviewed and stable  Complications: No apparent anesthesia complications

## 2012-10-15 NOTE — Anesthesia Procedure Notes (Signed)
Procedure Name: LMA Insertion Date/Time: 10/15/2012 12:10 PM Performed by: Renella Cunas D Pre-anesthesia Checklist: Patient identified, Emergency Drugs available, Suction available and Patient being monitored Patient Re-evaluated:Patient Re-evaluated prior to inductionOxygen Delivery Method: Circle System Utilized Preoxygenation: Pre-oxygenation with 100% oxygen Intubation Type: IV induction Ventilation: Mask ventilation without difficulty LMA: LMA inserted LMA Size: 4.0 Number of attempts: 1 Airway Equipment and Method: bite block Placement Confirmation: positive ETCO2 Tube secured with: Tape Dental Injury: Teeth and Oropharynx as per pre-operative assessment

## 2012-10-15 NOTE — Brief Op Note (Signed)
10/15/2012  1:08 PM  PATIENT:  Sheryl [name]  42 y.o. female  PRE-OPERATIVE DIAGNOSIS:  RIGHT LARGE URETERAL STONE  POST-OPERATIVE DIAGNOSIS:  RIGHT LARGE URETERAL STONE  PROCEDURE:  Procedure(s): CYSTOSCOPY WITH RETROGRADE PYELOGRAM, URETEROSCOPY AND REMOVAL STENT WITH  STENT PLACEMENT (Right) HOLMIUM LASER APPLICATION (Right)  SURGEON:  Surgeon(s) and Role:    * Sebastian Ache, MD - Primary  PHYSICIAN ASSISTANT:   ASSISTANTS: none   ANESTHESIA:   general  EBL:  Total I/O In: 200 [I.V.:200] Out: -   BLOOD ADMINISTERED:none  DRAINS: none   LOCAL MEDICATIONS USED:  NONE  SPECIMEN:  Source of Specimen:  Rt Ureteral Stone  DISPOSITION OF SPECIMEN:  [facility] for Compositional Analysis  COUNTS:  YES  TOURNIQUET:  * No tourniquets in log *  DICTATION: .Other Dictation: Dictation Number [id number]  PLAN OF CARE: Discharge to home after PACU  PATIENT DISPOSITION:  PACU - hemodynamically stable.   Delay start of Pharmacological VTE agent (>24hrs) due to surgical blood loss or risk of bleeding: not applicable

## 2012-10-16 NOTE — Op Note (Signed)
Sheryl, [name] NO.:  [id number]  MEDICAL RECORD NO.:  [id number]  LOCATION:  1433                         FACILITY:  [facility]  PHYSICIAN:  Sebastian Ache, MD     DATE OF BIRTH:  05-Jul-1970  DATE OF PROCEDURE:  10/15/2012 DATE OF DISCHARGE:  10/15/2012                              OPERATIVE REPORT   PREOPERATIVE DIAGNOSIS:  Right ureteral stone.  History of urosepsis.  PROCEDURE: 1. Cystoscopy with right retrograde pyelogram interpretation. 2. Exchange of right ureteral stent to 6 x 24, no tether. 3. Right ureteroscopy with laser lithotripsy.  ESTIMATED BLOOD LOSS:  Nil.  FINDINGS: 1. Right large intrarenal stone corresponding to previous right     ureteral stone. 2. Unremarkable urinary bladder. 3. Unremarkable right retrograde pyelogram.  SPECIMENS:  Right ureteral stone for compositional analysis.  INDICATION:  Ms. Sheryl [name] is a pleasant 42 year old [place] lady who was found on workup for fevers in colicky flank pain to have a right proximal ureteral stone with hydronephrosis and urosepsis last month. She underwent urgent ureteral stenting as well as IV antibiotics, transitioned to oral antibiotics for acute management.  She defervesced and has done well in the interval.  She now presents for definitive management of her stone.  Informed consents was obtained and placed in medical record.  PROCEDURE IN DETAIL:  The patient being, Sheryl [name] was verified. Procedure being right ureteroscopic stone manipulation was confirmed. The procedure was carried out.  Time-out was performed.  Intravenous antibiotics were administered.  General LMA anesthesia was introduced. The patient was placed into a low lithotomy position.  Sterile field was created by prepping and draping the patient's vagina, introitus, and proximal thighs using iodine x3.  Next, cystourethroscopy was performed using a 22-French rigid cystoscope with 12-degree offset lens. Inspection of  the area of bladder revealed distal end of the right ureteral stent in situ.  This was grasped, brought to the level of the urethral meatus through which a 0.038 Glidewire was advanced all the upper pole.  The stent was then exchanged for an open-ended catheter and right retrograde pyelogram was obtained.  Right retrograde pyelogram demonstrated single right ureter, single system, right kidney.  There was a question of filling defect within the renal pelvis consistent with likely prior ureteral stone, and the Glidewire was once again advanced and set aside as a safety wire.  Next, semi-rigid ureteroscopy along the entire length of the right ureter with a 6-French semi-rigid ureteroscope along a separate sensor working wire. No mucosal abnormalities or calcifications were found.  This was then exchanged for the 24 cm 12/14 ureteral access sheath over the working wire using fluoroscopic guidance to the level of the proximal ureter. Next, flexible digital ureteroscopy was performed of the proximal ureter and entire right kidney using 8-French flexible digital ureteroscope. This revealed a large right renal pelvis stone and appeared to be much too large for basketing as such holmium laser energy was applied using settings of 0.5 joules in 5 hertz fragmenting the stone into approximately 10 smaller pieces.  These were then grasped with an escape basket and brought out in their entirety, set aside for compositional  analysis.  Repeat systematic inspection of the kidney revealed complete resolution of all fragments larger than 1/3 mm.  No evidence of perforation and there was excellent hemostasis.  The sheath was removed under continuous ureteroscopic vision.  No mucosal abnormalities were found.  Finally, a new 6 x 24 double-J stent was placed over the remaining safety wire.  Good proximal and distal curl were noted. Bladder was emptied per cystoscope.  Procedure was then terminated.   The patient tolerated the procedure well.  There were no immediate periprocedural complications.  The patient was taken to postanesthesia care unit in stable condition.          ______________________________ Sebastian Ache, MD     TM/MEDQ  D:  10/15/2012  T:  10/16/2012  Job:  [id number]

## 2012-10-18 ENCOUNTER — Encounter (HOSPITAL_BASED_OUTPATIENT_CLINIC_OR_DEPARTMENT_OTHER): Payer: Self-pay | Admitting: Urology

## 2014-09-14 ENCOUNTER — Emergency Department (HOSPITAL_COMMUNITY): Payer: 59

## 2014-09-14 ENCOUNTER — Inpatient Hospital Stay (HOSPITAL_COMMUNITY)
Admission: EM | Admit: 2014-09-14 | Discharge: 2014-09-17 | DRG: 392 | Disposition: A | Discharge status: Home / Self Care | Payer: 59 | Attending: Internal Medicine | Admitting: Internal Medicine

## 2014-09-14 ENCOUNTER — Encounter (HOSPITAL_COMMUNITY): Payer: Self-pay | Admitting: *Deleted

## 2014-09-14 DIAGNOSIS — R197 Diarrhea, unspecified: Secondary | ICD-10-CM

## 2014-09-14 DIAGNOSIS — K759 Inflammatory liver disease, unspecified: Secondary | ICD-10-CM

## 2014-09-14 DIAGNOSIS — K219 Gastro-esophageal reflux disease without esophagitis: Secondary | ICD-10-CM | POA: Diagnosis present

## 2014-09-14 DIAGNOSIS — R109 Unspecified abdominal pain: Secondary | ICD-10-CM | POA: Diagnosis present

## 2014-09-14 DIAGNOSIS — D72829 Elevated white blood cell count, unspecified: Secondary | ICD-10-CM | POA: Diagnosis present

## 2014-09-14 DIAGNOSIS — R200 Anesthesia of skin: Secondary | ICD-10-CM | POA: Diagnosis present

## 2014-09-14 DIAGNOSIS — K5900 Constipation, unspecified: Secondary | ICD-10-CM | POA: Diagnosis present

## 2014-09-14 DIAGNOSIS — R1013 Epigastric pain: Secondary | ICD-10-CM

## 2014-09-14 DIAGNOSIS — R7989 Other specified abnormal findings of blood chemistry: Secondary | ICD-10-CM | POA: Diagnosis present

## 2014-09-14 DIAGNOSIS — N200 Calculus of kidney: Secondary | ICD-10-CM | POA: Diagnosis present

## 2014-09-14 DIAGNOSIS — R803 Bence Jones proteinuria: Secondary | ICD-10-CM | POA: Diagnosis present

## 2014-09-14 DIAGNOSIS — R1011 Right upper quadrant pain: Secondary | ICD-10-CM

## 2014-09-14 DIAGNOSIS — M4806 Spinal stenosis, lumbar region: Secondary | ICD-10-CM | POA: Diagnosis present

## 2014-09-14 DIAGNOSIS — R945 Abnormal results of liver function studies: Secondary | ICD-10-CM | POA: Diagnosis present

## 2014-09-14 DIAGNOSIS — R208 Other disturbances of skin sensation: Secondary | ICD-10-CM

## 2014-09-14 DIAGNOSIS — R112 Nausea with vomiting, unspecified: Secondary | ICD-10-CM | POA: Diagnosis present

## 2014-09-14 DIAGNOSIS — D649 Anemia, unspecified: Secondary | ICD-10-CM | POA: Diagnosis present

## 2014-09-14 DIAGNOSIS — Z9889 Other specified postprocedural states: Secondary | ICD-10-CM | POA: Diagnosis not present

## 2014-09-14 DIAGNOSIS — B1910 Unspecified viral hepatitis B without hepatic coma: Secondary | ICD-10-CM | POA: Diagnosis present

## 2014-09-14 DIAGNOSIS — R05 Cough: Secondary | ICD-10-CM

## 2014-09-14 DIAGNOSIS — R059 Cough, unspecified: Secondary | ICD-10-CM

## 2014-09-14 DIAGNOSIS — B181 Chronic viral hepatitis B without delta-agent: Secondary | ICD-10-CM | POA: Diagnosis present

## 2014-09-14 LAB — URINALYSIS, ROUTINE W REFLEX MICROSCOPIC
BILIRUBIN URINE: NEGATIVE
Glucose, UA: NEGATIVE mg/dL
Ketones, ur: NEGATIVE mg/dL
NITRITE: NEGATIVE
PROTEIN: 30 mg/dL — AB
SPECIFIC GRAVITY, URINE: 1.019 (ref 1.005–1.030)
UROBILINOGEN UA: 0.2 mg/dL (ref 0.0–1.0)
pH: 6.5 (ref 5.0–8.0)

## 2014-09-14 LAB — DIFFERENTIAL
BAND NEUTROPHILS: 18 % — AB (ref 0–10)
Basophils Relative: 0 % (ref 0–1)
Eosinophils Relative: 1 % (ref 0–5)
Lymphocytes Relative: 52 % — ABNORMAL HIGH (ref 12–46)
METAMYELOCYTES PCT: 5 %
MYELOCYTES: 6 %
Monocytes Relative: 9 % (ref 3–12)
NEUTROS PCT: 9 % — AB (ref 43–77)

## 2014-09-14 LAB — COMPREHENSIVE METABOLIC PANEL
ALK PHOS: 58 U/L (ref 38–126)
ALT: 17 U/L (ref 14–54)
ANION GAP: 3 — AB (ref 5–15)
AST: 152 U/L — ABNORMAL HIGH (ref 15–41)
Albumin: 2.7 g/dL — ABNORMAL LOW (ref 3.5–5.0)
BUN: 11 mg/dL (ref 6–20)
CALCIUM: 9.2 mg/dL (ref 8.9–10.3)
CHLORIDE: 106 mmol/L (ref 101–111)
CO2: 27 mmol/L (ref 22–32)
CREATININE: 0.76 mg/dL (ref 0.44–1.00)
GFR calc non Af Amer: >60 mL/min (ref >60)
Glucose, Bld: 97 mg/dL (ref 65–99)
Potassium: 4 mmol/L (ref 3.5–5.1)
Sodium: 136 mmol/L (ref 135–145)
TOTAL PROTEIN: 10.3 g/dL — AB (ref 6.5–8.1)
Total Bilirubin: 0.3 mg/dL (ref 0.3–1.2)

## 2014-09-14 LAB — CBC
HEMATOCRIT: 26 % — AB (ref 36.0–46.0)
Hemoglobin: 8.1 g/dL — ABNORMAL LOW (ref 12.0–15.0)
MCH: 26.4 pg (ref 26.0–34.0)
MCHC: 31.2 g/dL (ref 30.0–36.0)
MCV: 84.7 fL (ref 78.0–100.0)
Platelets: 310 10*3/uL (ref 150–400)
RBC: 3.07 MIL/uL — ABNORMAL LOW (ref 3.87–5.11)
RDW: 18.4 % — AB (ref 11.5–15.5)
WBC: 54.1 10*3/uL (ref 4.0–10.5)

## 2014-09-14 LAB — URINE MICROSCOPIC-ADD ON

## 2014-09-14 LAB — I-STAT BETA HCG BLOOD, ED (MC, WL, AP ONLY)

## 2014-09-14 LAB — I-STAT CG4 LACTIC ACID, ED
LACTIC ACID, VENOUS: 1.4 mmol/L (ref 0.5–2.0)
Lactic Acid, Venous: 0.39 mmol/L — ABNORMAL LOW (ref 0.5–2.0)

## 2014-09-14 LAB — LIPASE, BLOOD: LIPASE: 22 U/L (ref 22–51)

## 2014-09-14 MED ORDER — HYDROMORPHONE HCL 1 MG/ML IJ SOLN
1.0000 mg | Freq: Once | INTRAMUSCULAR | Status: AC
  Administered 2014-09-14: 1 mg via INTRAVENOUS
  Filled 2014-09-14: qty 1

## 2014-09-14 MED ORDER — MORPHINE SULFATE 4 MG/ML IJ SOLN
4.0000 mg | Freq: Once | INTRAMUSCULAR | Status: AC
  Administered 2014-09-14: 4 mg via INTRAVENOUS
  Filled 2014-09-14: qty 1

## 2014-09-14 MED ORDER — IOHEXOL 300 MG/ML  SOLN
100.0000 mL | Freq: Once | INTRAMUSCULAR | Status: AC | PRN
  Administered 2014-09-14: 100 mL via INTRAVENOUS

## 2014-09-14 MED ORDER — ONDANSETRON 4 MG PO TBDP
4.0000 mg | ORAL_TABLET | Freq: Once | ORAL | Status: AC | PRN
  Administered 2014-09-14: 4 mg via ORAL

## 2014-09-14 MED ORDER — ONDANSETRON HCL 4 MG/2ML IJ SOLN
4.0000 mg | Freq: Three times a day (TID) | INTRAMUSCULAR | Status: DC | PRN
  Administered 2014-09-15: 4 mg via INTRAVENOUS
  Filled 2014-09-14: qty 2

## 2014-09-14 MED ORDER — SODIUM CHLORIDE 0.9 % IV SOLN
INTRAVENOUS | Status: AC
  Administered 2014-09-15: 1000 mL via INTRAVENOUS

## 2014-09-14 MED ORDER — LIDOCAINE VISCOUS 2 % MT SOLN
15.0000 mL | Freq: Once | OROMUCOSAL | Status: AC
  Administered 2014-09-14: 15 mL via OROMUCOSAL
  Filled 2014-09-14: qty 15

## 2014-09-14 MED ORDER — PANTOPRAZOLE SODIUM 40 MG IV SOLR
40.0000 mg | Freq: Once | INTRAVENOUS | Status: AC
  Administered 2014-09-14: 40 mg via INTRAVENOUS
  Filled 2014-09-14: qty 40

## 2014-09-14 MED ORDER — SODIUM CHLORIDE 0.9 % IV BOLUS (SEPSIS)
1000.0000 mL | Freq: Once | INTRAVENOUS | Status: AC
  Administered 2014-09-14: 1000 mL via INTRAVENOUS

## 2014-09-14 MED ORDER — PANTOPRAZOLE SODIUM 40 MG IV SOLR
40.0000 mg | INTRAVENOUS | Status: DC
  Administered 2014-09-15: 40 mg via INTRAVENOUS
  Filled 2014-09-14: qty 40

## 2014-09-14 MED ORDER — ONDANSETRON 4 MG PO TBDP
ORAL_TABLET | ORAL | Status: AC
  Filled 2014-09-14: qty 1

## 2014-09-14 MED ORDER — SODIUM CHLORIDE 0.9 % IV BOLUS (SEPSIS)
1500.0000 mL | Freq: Once | INTRAVENOUS | Status: AC
  Administered 2014-09-14: 1500 mL via INTRAVENOUS

## 2014-09-14 MED ORDER — SODIUM CHLORIDE 0.9 % IJ SOLN
3.0000 mL | Freq: Two times a day (BID) | INTRAMUSCULAR | Status: DC
  Administered 2014-09-15 – 2014-09-17 (×5): 3 mL via INTRAVENOUS

## 2014-09-14 MED ORDER — IOHEXOL 300 MG/ML  SOLN
25.0000 mL | Freq: Once | INTRAMUSCULAR | Status: AC | PRN
  Administered 2014-09-14: 25 mL via ORAL

## 2014-09-14 MED ORDER — MORPHINE SULFATE 2 MG/ML IJ SOLN: 2.0000 mg | INTRAMUSCULAR | Status: DC | PRN

## 2014-09-14 MED ORDER — ALUM & MAG HYDROXIDE-SIMETH 200-200-20 MG/5ML PO SUSP
15.0000 mL | Freq: Once | ORAL | Status: AC
  Administered 2014-09-14: 15 mL via ORAL
  Filled 2014-09-14: qty 30

## 2014-09-14 MED ORDER — ONDANSETRON HCL 4 MG/2ML IJ SOLN
4.0000 mg | Freq: Once | INTRAMUSCULAR | Status: AC
  Administered 2014-09-14: 4 mg via INTRAVENOUS
  Filled 2014-09-14 (×2): qty 2

## 2014-09-14 MED ORDER — HEPARIN SODIUM (PORCINE) 5000 UNIT/ML IJ SOLN
5000.0000 [IU] | Freq: Three times a day (TID) | INTRAMUSCULAR | Status: DC
  Administered 2014-09-15 – 2014-09-17 (×7): 5000 [IU] via SUBCUTANEOUS
  Filled 2014-09-14 (×7): qty 1

## 2014-09-14 NOTE — ED Provider Notes (Signed)
CSN: [id number]     Arrival date & time 09/14/14  1457 History   First MD Initiated Contact with Patient 09/14/14 1650     Chief Complaint  Patient presents with  . Abdominal Pain  . Emesis     (Consider location/radiation/quality/duration/timing/severity/associated sxs/prior Treatment) Patient is a 44 y.o. female presenting with abdominal pain. The history is provided by the patient. A language interpreter was used.  Abdominal Pain Pain location:  Epigastric and R flank Pain quality: sharp and shooting   Pain radiates to:  Does not radiate Pain severity:  Severe Onset quality:  Gradual Duration:  3 days Timing:  Constant Progression:  Worsening Chronicity:  New Relieved by:  Nothing Worsened by:  Palpation and eating Ineffective treatments:  None tried Associated symptoms: chills, fever, nausea and vomiting   Associated symptoms: no chest pain, no constipation, no diarrhea, no dysuria and no shortness of breath   Risk factors: no alcohol abuse    44 yo F with a chief complaint of abdominal pain. This been going on for 3 days. Associated with nausea vomiting and diarrhea. Patient denies any dark stools or blood in her stool. Patient does have a history of hepatitis as well as a right ureteral stone that needed stent placement. Patient also had a right vats for a persistent right-sided pleural effusion. Patient denies chest pain shortness of breath. Pain is epigastric and radiates to her right side. Pain comes and goes. Slightly worse with food. Denies any dysuria. Has had some subjective fevers and chills at home.  Past Medical History  Diagnosis Date  . GERD (gastroesophageal reflux disease)   . Hepatitis   . History of positive PPD     DX 2011--  CXR DONE NO EVIDENCE  . Hydronephrosis, right   . Right ureteral stone   . History of ureter stent   . Chills with fever     intermittently since d/c from hospital  . Neuromuscular disorder     legs numb intermittently  .  Dysuria-frequency syndrome     w/ pink urine  . Urosepsis 8/14    admitted to [facility]  . Pneumonia    Past Surgical History  Procedure Laterality Date  . Cystoscopy w/ ureteral stent placement Right 09/25/2012    Procedure: CYSTOSCOPY WITH RETROGRADE PYELOGRAM/URETERAL STENT PLACEMENT;  Surgeon: Alexis Frock, MD;  Location: [facility];  Service: Urology;  Laterality: Right;  . Liver biopsy    . Right vats w/ drainage peural effusion and bx's  10-30-2008  . Removal of uterine cyst      years ago  . Other surgical history Right     removal of ovarian cyst  . Cystoscopy with retrograde pyelogram, ureteroscopy and stent placement Right 10/15/2012    Procedure: CYSTOSCOPY WITH RETROGRADE PYELOGRAM, URETEROSCOPY AND REMOVAL STENT WITH  STENT PLACEMENT;  Surgeon: Alexis Frock, MD;  Location: [facility];  Service: Urology;  Laterality: Right;  . Holmium laser application Right 07/10/3708    Procedure: HOLMIUM LASER APPLICATION;  Surgeon: Alexis Frock, MD;  Location: [facility];  Service: Urology;  Laterality: Right;   Family History  Problem Relation Age of Onset  . Stomach cancer Mother   . Lung disease Father    Social History  Substance Use Topics  . Smoking status: Never Smoker   . Smokeless tobacco: None  . Alcohol Use: No   OB History    No data available     Review of Systems  Constitutional: Positive  for fever and chills.  HENT: Negative for congestion and rhinorrhea.   Eyes: Negative for redness and visual disturbance.  Respiratory: Negative for shortness of breath and wheezing.   Cardiovascular: Negative for chest pain and palpitations.  Gastrointestinal: Positive for nausea, vomiting and abdominal pain. Negative for diarrhea, constipation and blood in stool.  Genitourinary: Negative for dysuria and urgency.  Musculoskeletal: Negative for myalgias and arthralgias.  Skin: Negative for pallor and wound.  Neurological: Negative for dizziness  and headaches.      Allergies  Review of patient's allergies indicates no known allergies.  Home Medications   Prior to Admission medications   Not on File   BP 109/92 mmHg  Pulse 68  Temp(Src) 98.3 F (36.8 C) (Oral)  Resp 10  SpO2 98% Physical Exam  Constitutional: She is oriented to person, place, and time. She appears well-developed and well-nourished. No distress.  HENT:  Head: Normocephalic and atraumatic.  Eyes: EOM are normal. Pupils are equal, round, and reactive to light.  Neck: Normal range of motion. Neck supple.  Cardiovascular: Normal rate and regular rhythm.  Exam reveals no gallop and no friction rub.   No murmur heard. Pulmonary/Chest: Effort normal. She has no wheezes. She has no rales.  Abdominal: Soft. She exhibits no distension. There is tenderness (Tender palpation worst in the right flank. Mild right CVA tenderness. Negative Murphy sign. Epigastric tenderness. No noted right lower quadrant tenderness).  Musculoskeletal: She exhibits no edema or tenderness.  Neurological: She is alert and oriented to person, place, and time.  Skin: Skin is warm and dry. She is not diaphoretic.  Psychiatric: She has a normal mood and affect. Her behavior is normal.    ED Course  Procedures (including critical care time) Labs Review Labs Reviewed  COMPREHENSIVE METABOLIC PANEL - Abnormal; Notable for the following:    Total Protein 10.3 (*)    Albumin 2.7 (*)    AST 152 (*)    Anion gap 3 (*)    All other components within normal limits  CBC - Abnormal; Notable for the following:    WBC 54.1 (*)    RBC 3.07 (*)    Hemoglobin 8.1 (*)    HCT 26.0 (*)    RDW 18.4 (*)    All other components within normal limits  URINALYSIS, ROUTINE W REFLEX MICROSCOPIC (NOT AT [facility]) - Abnormal; Notable for the following:    APPearance CLOUDY (*)    Hgb urine dipstick LARGE (*)    Protein, ur 30 (*)    Leukocytes, UA MODERATE (*)    All other components within normal limits   URINE MICROSCOPIC-ADD ON - Abnormal; Notable for the following:    Squamous Epithelial / LPF MANY (*)    Bacteria, UA FEW (*)    Crystals CA OXALATE CRYSTALS (*)    All other components within normal limits  DIFFERENTIAL - Abnormal; Notable for the following:    Neutrophils Relative % 9 (*)    Band Neutrophils 18 (*)    Lymphocytes Relative 52 (*)    All other components within normal limits  I-STAT CG4 LACTIC ACID, ED - Abnormal; Notable for the following:    Lactic Acid, Venous 0.39 (*)    All other components within normal limits  CULTURE, BLOOD (ROUTINE X 2)  CULTURE, BLOOD (ROUTINE X 2)  URINE CULTURE  LIPASE, BLOOD  OCCULT BLOOD X 1 CARD TO LAB, STOOL  URINALYSIS, ROUTINE W REFLEX MICROSCOPIC (NOT AT [facility])  URINE RAPID DRUG  SCREEN, HOSP PERFORMED  HIV ANTIBODY (ROUTINE TESTING)  TSH  VITAMIN B12  IRON AND TIBC  FERRITIN  RETICULOCYTES  IMMUNOFIXATION ELECTROPHORESIS, URINE (WITH TOT PROT)  PROTEIN ELECTROPHORESIS, SERUM  LACTATE DEHYDROGENASE  HAPTOGLOBIN  MONONUCLEOSIS SCREEN  HEPATITIS PANEL, ACUTE  LACTIC ACID, PLASMA  LACTIC ACID, PLASMA  PROCALCITONIN  PROTIME-INR  APTT  COMPREHENSIVE METABOLIC PANEL  CBC WITH DIFFERENTIAL/PLATELET  SAVE SMEAR  PATHOLOGIST SMEAR REVIEW  I-STAT CG4 LACTIC ACID, ED  I-STAT BETA HCG BLOOD, ED ([facility], [facility], [facility] ONLY)  TYPE AND SCREEN    Imaging Review Ct Abdomen Pelvis W Contrast  09/14/2014   CLINICAL DATA:  Three-day history abdominal pain with nausea, vomiting, and diarrhea  EXAM: CT ABDOMEN AND PELVIS WITH CONTRAST  TECHNIQUE: Multidetector CT imaging of the abdomen and pelvis was performed using the standard protocol following bolus administration of intravenous contrast. Oral contrast was also administered.  CONTRAST:  120m OMNIPAQUE IOHEXOL 300 MG/ML  SOLN  COMPARISON:  September 25, 2012  FINDINGS: There is bibasilar atelectatic change.  Liver is prominent, measuring 19.4 cm in length. There is a 9 x 9 mm cyst in the right  lobe of the liver posteriorly. No other focal liver lesion is identified. Gallbladder wall is not thickened. There is no biliary duct dilatation.  There is a stable complex multi-cystic structure along the lateral aspect of the spleen measuring 9.0 x 9.0 x 2.8 cm. The spleen itself appears within normal limits and stable.  Pancreas and adrenals appear normal.  There is a left parapelvic cyst measuring 1.8 x 1.7 cm. There is a 1 x 1 cm cyst in the periphery of the mid left kidney. There is a 7 mm cyst in the periphery of the right kidney. There is an 8 mm cyst in the medial mid right kidney. There is no appreciable hydronephrosis on either side. There is a nonobstructing calculus in the lower pole of the left kidney measuring 1.8 x 1.2 cm. There is a nonobstructing 2 mm calculus in the periphery of the mid right kidney. There is no ureteral calculus on either side.  In the pelvis, the urinary bladder is midline with normal wall thickness. Uterus is anteverted. There is a small amount of free fluid in the pelvis. There is fluid tracking posterior from the left ovary as well. There is no appreciable pelvic mass.  Appendix appears normal.  There is no bowel obstruction. No free air or portal venous air. There is no ascites, adenopathy, or abscess in the abdomen or pelvis. There is no abdominal aortic aneurysm. There are no blastic or lytic bone lesions. There are stable gluteal region calcifications bilaterally. There is spinal stenosis at L4-5 due to bony hypertrophy and disc protrusion.  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 The stability of this lesion suggests benign etiology. Major differential considerations include lymphangioma or residua of previous trauma with liquified hematoma.  Nonobstructing calculi in each kidney. No hydronephrosis on either side. No ureteral calculi.  Small amount of fluid adjacent to  the left ovary is with a small amount of free fluid in the cul-de-sac. Suspect recent ovarian cyst rupture.  Prominent liver.  Small liver cyst.  No other focal liver lesion.  No bowel obstruction.  No abscess.  Appendix appears normal.  Spinal stenosis at L4-5 due to disc protrusion and bony hypertrophy.   Electronically Signed   By: WLowella GripIII M.D.  On: 09/14/2014 18:54   US Abdomen Limited Ruq  09/14/2014   CLINICAL DATA:  44 year old female with right upper quadrant abdominal pain.  EXAM: US ABDOMEN LIMITED - RIGHT UPPER QUADRANT  COMPARISON:  CT the abdomen and pelvis 09/14/2014.  FINDINGS: Gallbladder:  No gallstones or wall thickening visualized. No sonographic Murphy sign noted.  Common bile duct:  Diameter: 2.9 mm in the porta hepatis  Liver:  No focal lesion identified. Within normal limits in parenchymal echogenicity.  IMPRESSION: No acute findings. Specifically, no gallstones or findings to suggest an acute cholecystitis at this time.   Electronically Signed   By: Vinnie Langton M.D.   On: 09/14/2014 21:13   I, Cecilio Asper, personally reviewed and evaluated these images and lab results as part of my medical decision-making.   EKG Interpretation None      Procedure note: Ultrasound Guided Peripheral IV Ultrasound guided 18 g peripheral 1.88 inch angiocath IV placement performed by me. Indications: Nursing unable to place IV. Details: The Left antecubital fossa and upper arm was evaluated with a multifrequency linear probe. Several patent brachial veins are noted. 1 attempts were made to cannulate a Left vein under realtime US guidance with successful cannulation of the vein and catheter placement. There is return of non-pulsatile dark red blood. The patient tolerated the procedure well without complications. An ultrasound image is archived.  MDM   Final diagnoses:  RUQ abdominal pain    44 yo F with a chief complaint of abdominal pain. This been going on for 3  days. Pain worse in the right side. language barrier concern for possible intra-abdominal pathology. Negative Percell Miller sign was obtain a CT scan with contrast.  CT scan residual is negative. Patient having continual pain. Repeat abdominal exam with continued epigastric and right upper quadrant tenderness. We'll obtaining ultrasound. Patient with anemia. Family states that she's had some dark emesis. No continued vomiting while in the ED. Possibility of a upper GI bleed.  Spoke with medicine, feels possibility of multiple myeloma.   The patients results and plan were reviewed and discussed.   Any x-rays performed were independently reviewed by myself.   Differential diagnosis were considered with the presenting HPI.  Medications  0.9 %  sodium chloride infusion (not administered)  morphine 2 MG/ML injection 2 mg (not administered)  ondansetron (ZOFRAN) injection 4 mg (not administered)  pantoprazole (PROTONIX) injection 40 mg (40 mg Intravenous Not Given 09/14/14 2247)  sodium chloride 0.9 % bolus 1,500 mL (not administered)  heparin injection 5,000 Units (not administered)  sodium chloride 0.9 % injection 3 mL (not administered)  ondansetron (ZOFRAN-ODT) disintegrating tablet 4 mg (4 mg Oral Given 09/14/14 1451)  morphine 4 MG/ML injection 4 mg (4 mg Intravenous Given 09/14/14 1917)  sodium chloride 0.9 % bolus 1,000 mL (0 mLs Intravenous Stopped 09/14/14 1953)  ondansetron (ZOFRAN) injection 4 mg (4 mg Intravenous Given 09/14/14 1916)  alum & mag hydroxide-simeth (MAALOX/MYLANTA) 200-200-20 MG/5ML suspension 15 mL (15 mLs Oral Given 09/14/14 1732)  lidocaine (XYLOCAINE) 2 % viscous mouth solution 15 mL (15 mLs Mouth/Throat Given 09/14/14 1733)  iohexol (OMNIPAQUE) 300 MG/ML solution 25 mL (25 mLs Oral Contrast Given 09/14/14 1715)  iohexol (OMNIPAQUE) 300 MG/ML solution 100 mL (100 mLs Intravenous Contrast Given 09/14/14 1819)  HYDROmorphone (DILAUDID) injection 1 mg (1 mg Intravenous Given 09/14/14  1951)  HYDROmorphone (DILAUDID) injection 1 mg (1 mg Intravenous Given 09/14/14 2122)  pantoprazole (PROTONIX) injection 40 mg (40 mg Intravenous Given 09/14/14 2121)  Filed Vitals:   09/14/14 1900 09/14/14 1930 09/14/14 2022 09/14/14 2300  BP: 106/61 106/64 102/60 109/92  Pulse: 74 74 76 68  Temp:      TempSrc:      Resp:   16 10  SpO2: 100% 98% 97% 98%    Final diagnoses:  RUQ abdominal pain    Admission/ observation were discussed with the admitting physician, patient and/or family and they are comfortable with the plan.    Deno Etienne, DO 09/14/14 2338

## 2014-09-14 NOTE — H&P (Addendum)
[facility] History and Physical  Prescious Hurless VCB:[id number] DOB: [birth year] DOA: 09/14/2014  Referring physician: ED physician PCP: No primary care provider on file.  Specialists:   Chief Complaint: Nausea, vomiting and abdominal pain.  HPI: Sheryl [name] is a 44 y.o. female with PMH of GERD, chronic bilateral leg numbness, right hydronephrosis, s/p of ureteral stent placement, hepatitis B, who presents with nausea, vomiting and abdominal pain.   Pt is a vietnamese, dose not speaking [place]. History is obtained through translator. Patient reports that she has been having nausea, vomiting and abdominal pain for more than 2 days. She does not have diarrhea. She vomited 5 times today. No blood in the vomitus. Her abdominal pain is located in the epigastric area, moderate, constant, nonradiating. It is not aggravated or alleviated by any known factors. She had chills and fever, but did not check her body temperature at home. Patient does not have chest pain or shortness, but has mild cough occasionally. She does not have dysuria, or burning on urination, but has increased urinary frequency recently. No hematuria or hematochezia. She has chronic bilateral leg numbness, which has not changed from baseline. She has generalized weakness. No rashes.   In ED, patient was found to have WBC 54.1, hemoglobin 8.1, platelet 310, lactate 1.40, lipase 22, abnormal liver function with the AST 152, ALT 17, protein 10.3, total bilirubin 0.3, albumen 2.7, electrolytes okay. Urinalysis is dirty catch with many squamous cell, the repeated urinalysis pending.   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 The stability of this lesion suggests benign etiology. Nonobstructing calculi in each kidney, no hydronephrosis on either side. No ureteral calculi, small amount of fluid adjacent to the left ovary is with a  small amount of free fluid in the cul-de-sac, small liver cyst, spinal stenosis at L4-5 due to disc protrusion and bony hypertrophy. Abd [place] is negative for acute abnormalities. Patient is admitted to inpatient for further evaluation and treatment.  Where does patient live?   At home    Can patient participate in ADLs?  Yes    Review of Systems:   General: has fevers, chills, no changes in body weight, has poor appetite, has fatigue HEENT: no blurry vision, hearing changes or sore throat Pulm: no dyspnea, has coughing, no wheezing CV: no chest pain, palpitations Abd: has nausea, vomiting, abdominal pain, no diarrhea, constipation GU: no dysuria, burning on urination, has increased urinary frequency, no hematuria  Ext: no leg edema Neuro: no unilateral weakness, numbness, or tingling, no vision change or hearing loss Skin: no rash MSK: No muscle spasm, no deformity, no limitation of range of movement in spin Heme: No easy bruising.  Travel history: No recent long distant travel.  Allergy: No Known Allergies  Past Medical History  Diagnosis Date  . GERD (gastroesophageal reflux disease)   . Hepatitis   . History of positive PPD     DX 2011--  CXR DONE NO EVIDENCE  . Hydronephrosis, right   . Right ureteral stone   . History of ureter stent   . Chills with fever     intermittently since d/c from hospital  . Neuromuscular disorder     legs numb intermittently  . Dysuria-frequency syndrome     w/ pink urine  . Urosepsis 8/14    admitted to [facility]  . Pneumonia     Past Surgical History  Procedure Laterality Date  .  Cystoscopy w/ ureteral stent placement Right 09/25/2012    Procedure: CYSTOSCOPY WITH RETROGRADE PYELOGRAM/URETERAL STENT PLACEMENT;  Surgeon: Alexis Frock, MD;  Location: [facility];  Service: Urology;  Laterality: Right;  . Liver biopsy    . Right vats w/ drainage peural effusion and bx's  10-30-2008  . Removal of uterine cyst      years ago  . Other surgical  history Right     removal of ovarian cyst  . Cystoscopy with retrograde pyelogram, ureteroscopy and stent placement Right 10/15/2012    Procedure: CYSTOSCOPY WITH RETROGRADE PYELOGRAM, URETEROSCOPY AND REMOVAL STENT WITH  STENT PLACEMENT;  Surgeon: Alexis Frock, MD;  Location: [facility];  Service: Urology;  Laterality: Right;  . Holmium laser application Right 5/62/1308    Procedure: HOLMIUM LASER APPLICATION;  Surgeon: Alexis Frock, MD;  Location: [facility];  Service: Urology;  Laterality: Right;    Social History:  reports that she has never smoked. She does not have any smokeless tobacco history on file. She reports that she does not drink alcohol. Her drug history is not on file.  Family History:  Family History  Problem Relation Age of Onset  . Stomach cancer Mother   . Lung disease Father      Prior to Admission medications   Not on File    Physical Exam: Filed Vitals:   09/14/14 1851 09/14/14 1900 09/14/14 1930 09/14/14 2022  BP:  106/61 106/64 102/60  Pulse: 67 74 74 76  Temp:      TempSrc:      Resp:    16  SpO2: 100% 100% 98% 97%   General: Not in acute distress. Very weak, sick looking HEENT:       Eyes: PERRL, EOMI, no scleral icterus.       ENT: No discharge from the ears and nose, no pharynx injection, no tonsillar enlargement.        Neck: No JVD, no bruit, no mass felt. Heme: No neck lymph node enlargement. Cardiac: S1/S2, RRR, No murmurs, No gallops or rubs. Pulm: No rales, wheezing, rhonchi or rubs. Abd: Soft, nondistended, tenderness over epigastric area, no rebound pain, no organomegaly, BS present. Ext: No pitting leg edema bilaterally. 2+DP/PT pulse bilaterally. Musculoskeletal: No joint deformities, No joint redness or warmth, no limitation of ROM in spin. Skin: No rashes.  Neuro: drowsy, but oriented X3, cranial nerves II-XII grossly intact, muscle strength symmetric 4/5 in all extremities, sensation to light  touch intact. Brachial reflex 1+ bilaterally. Knee reflex 1+ bilaterally. Negative Babinski's sign. Normal finger to nose test. Psych: Patient is not psychotic, no suicidal or hemocidal ideation.  Labs on Admission:  Basic Metabolic Panel:  Recent Labs Lab 09/14/14 1500  NA 136  K 4.0  CL 106  CO2 27  GLUCOSE 97  BUN 11  CREATININE 0.76  CALCIUM 9.2   Liver Function Tests:  Recent Labs Lab 09/14/14 1500  AST 152*  ALT 17  ALKPHOS 58  BILITOT 0.3  PROT 10.3*  ALBUMIN 2.7*    Recent Labs Lab 09/14/14 1500  LIPASE 22   No results for input(s): AMMONIA in the last 168 hours. CBC:  Recent Labs Lab 09/14/14 1500  WBC 54.1*  HGB 8.1*  HCT 26.0*  MCV 84.7  PLT 310   Cardiac Enzymes: No results for input(s): CKTOTAL, CKMB, CKMBINDEX, TROPONINI in the last 168 hours.  BNP (last 3 results) No results for input(s): BNP in the last 8760 hours.  ProBNP (last  3 results) No results for input(s): PROBNP in the last 8760 hours.  CBG: No results for input(s): GLUCAP in the last 168 hours.  Radiological Exams on Admission: Ct Abdomen Pelvis W Contrast  09/14/2014   CLINICAL DATA:  Three-day history abdominal pain with nausea, vomiting, and diarrhea  EXAM: CT ABDOMEN AND PELVIS WITH CONTRAST  TECHNIQUE: Multidetector CT imaging of the abdomen and pelvis was performed using the standard protocol following bolus administration of intravenous contrast. Oral contrast was also administered.  CONTRAST:  157mL OMNIPAQUE IOHEXOL 300 MG/ML  SOLN  COMPARISON:  September 25, 2012  FINDINGS: There is bibasilar atelectatic change.  Liver is prominent, measuring 19.4 cm in length. There is a 9 x 9 mm cyst in the right lobe of the liver posteriorly. No other focal liver lesion is identified. Gallbladder wall is not thickened. There is no biliary duct dilatation.  There is a stable complex multi-cystic structure along the lateral aspect of the spleen measuring 9.0 x 9.0 x 2.8 cm. The spleen  itself appears within normal limits and stable.  Pancreas and adrenals appear normal.  There is a left parapelvic cyst measuring 1.8 x 1.7 cm. There is a 1 x 1 cm cyst in the periphery of the mid left kidney. There is a 7 mm cyst in the periphery of the right kidney. There is an 8 mm cyst in the medial mid right kidney. There is no appreciable hydronephrosis on either side. There is a nonobstructing calculus in the lower pole of the left kidney measuring 1.8 x 1.2 cm. There is a nonobstructing 2 mm calculus in the periphery of the mid right kidney. There is no ureteral calculus on either side.  In the pelvis, the urinary bladder is midline with normal wall thickness. Uterus is anteverted. There is a small amount of free fluid in the pelvis. There is fluid tracking posterior from the left ovary as well. There is no appreciable pelvic mass.  Appendix appears normal.  There is no bowel obstruction. No free air or portal venous air. There is no ascites, adenopathy, or abscess in the abdomen or pelvis. There is no abdominal aortic aneurysm. There are no blastic or lytic bone lesions. There are stable gluteal region calcifications bilaterally. There is spinal stenosis at L4-5 due to bony hypertrophy and disc protrusion.  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 The stability of this lesion suggests benign etiology. Major differential considerations include lymphangioma or residua of previous trauma with liquified hematoma.  Nonobstructing calculi in each kidney. No hydronephrosis on either side. No ureteral calculi.  Small amount of fluid adjacent to the left ovary is with a small amount of free fluid in the cul-de-sac. Suspect recent ovarian cyst rupture.  Prominent liver.  Small liver cyst.  No other focal liver lesion.  No bowel obstruction.  No abscess.  Appendix appears normal.  Spinal stenosis at L4-5 due to disc  protrusion and bony hypertrophy.   Electronically Signed   By: Lowella Grip III M.D.   On: 09/14/2014 18:54   US Abdomen Limited Ruq  09/14/2014   CLINICAL DATA:  44 year old female with right upper quadrant abdominal pain.  EXAM: US ABDOMEN LIMITED - RIGHT UPPER QUADRANT  COMPARISON:  CT the abdomen and pelvis 09/14/2014.  FINDINGS: Gallbladder:  No gallstones or wall thickening visualized. No sonographic Murphy sign noted.  Common bile duct:  Diameter: 2.9 mm in the  porta hepatis  Liver:  No focal lesion identified. Within normal limits in parenchymal echogenicity.  IMPRESSION: No acute findings. Specifically, no gallstones or findings to suggest an acute cholecystitis at this time.   Electronically Signed   By: Vinnie Langton M.D.   On: 09/14/2014 21:13    EKG: Not done in ED, will get one.   Assessment/Plan Principal Problem:   Nausea & vomiting Active Problems:   Leukocytosis   Abdominal pain   Abnormal LFTs   GERD (gastroesophageal reflux disease)   Hepatitis B   Bilateral leg numbness   Normocytic anemia  Nausea vomiting and AP: Etiology is not clear. CT abdomen/pelvis and abdominal ultrasound are not impressive. Lipase 22, less likely to have pancreatitis. Differential diagnoses include viral gastroenteritis, hepatitis and gastric ulcer. Patient is not septic on admission, but with soft blood pressure. Currently hemodynamically stable. The repeated UA pending.   -will admit to SDU -IVF: 2.5 L NS and then 100cch -will get Procalcitonin and trend lactic acid levels -prn Zofran for nausea, morphine for pain -hepatitis panel  Leukocytosis: Etiology is not clear. WBC 54.1 with 52% of lymphocytes and neutrophil 9%, indicating possible blood disorder with lymphocytic lineage problems or reactive leukocytosis. She has elevated protein gap (7.6), indicating paraprotein production-->will r/o MM.  -Mononucleosis screen, HIV antibody, UPEP, SPEP, LDH, haptoglobin, peripheral  smear  Normocytic anemia: Hgb 9.1 on 09/26/12-->8.1. No hematuria or hematochezia per patient. -Anemia panel -FOBT  Abnormal LFTs: AST 33 on 09/25/12-->152 and ALT 12-->17.TBR normal. She has history of HBV with positive HBV surface antigen, but was not treated.  -hepatitis panel -alcohol level  GERD: -Protonix  Chronic bilateral leg numbness: likely due to spinal stenosis at L4-5 due to disc protrusion and bony hypertrophy as showed by CT-abd/pelvis -will check Vb12 and TSH level -May follow up with neurosurgeon   DVT ppx: SQ Heparin    Code Status: Full code Family Communication: Yes, patient's family at bed side Disposition Plan: Admit to inpatient   Date of Service 09/14/2014    Ivor Costa [facility] Pager [phone]  If 7PM-7AM, please contact night-coverage [email or web address] Password [email or web address] 09/14/2014, 10:56 PM

## 2014-09-14 NOTE — ED Notes (Signed)
IV unable to use, MD aware and will attempt ultrasound again.

## 2014-09-14 NOTE — ED Notes (Signed)
Pt reports abd pain and n/v/d x 3 days.

## 2014-09-15 ENCOUNTER — Inpatient Hospital Stay (HOSPITAL_COMMUNITY): Payer: 59

## 2014-09-15 DIAGNOSIS — R7989 Other specified abnormal findings of blood chemistry: Secondary | ICD-10-CM

## 2014-09-15 DIAGNOSIS — R112 Nausea with vomiting, unspecified: Principal | ICD-10-CM

## 2014-09-15 DIAGNOSIS — D72829 Elevated white blood cell count, unspecified: Secondary | ICD-10-CM

## 2014-09-15 LAB — CBC WITH DIFFERENTIAL/PLATELET
BLASTS: 0 %
Band Neutrophils: 0 % (ref 0–10)
Basophils Absolute: 0 10*3/uL (ref 0.0–0.1)
Basophils Relative: 0 % (ref 0–1)
Eosinophils Absolute: 0.5 10*3/uL (ref 0.0–0.7)
Eosinophils Relative: 1 % (ref 0–5)
HEMATOCRIT: 24.3 % — AB (ref 36.0–46.0)
Hemoglobin: 7.5 g/dL — ABNORMAL LOW (ref 12.0–15.0)
LYMPHS PCT: 52 % — AB (ref 12–46)
Lymphs Abs: 24.6 10*3/uL — ABNORMAL HIGH (ref 0.7–4.0)
MCH: 26.7 pg (ref 26.0–34.0)
MCHC: 30.9 g/dL (ref 30.0–36.0)
MCV: 86.5 fL (ref 78.0–100.0)
METAMYELOCYTES PCT: 0 %
Monocytes Absolute: 4.2 10*3/uL — ABNORMAL HIGH (ref 0.1–1.0)
Monocytes Relative: 9 % (ref 3–12)
Myelocytes: 0 %
Neutro Abs: 17.9 10*3/uL — ABNORMAL HIGH (ref 1.7–7.7)
Neutrophils Relative %: 38 % — ABNORMAL LOW (ref 43–77)
Other: 0 %
PLATELETS: 263 10*3/uL (ref 150–400)
Promyelocytes Absolute: 0 %
RBC: 2.81 MIL/uL — ABNORMAL LOW (ref 3.87–5.11)
RDW: 18.6 % — ABNORMAL HIGH (ref 11.5–15.5)
WBC: 47.2 10*3/uL — AB (ref 4.0–10.5)
nRBC: 0 /100 WBC

## 2014-09-15 LAB — TYPE AND SCREEN
ABO/RH(D): O POS
Antibody Screen: NEGATIVE

## 2014-09-15 LAB — COMPREHENSIVE METABOLIC PANEL
ALK PHOS: 45 U/L (ref 38–126)
ALT: 13 U/L — ABNORMAL LOW (ref 14–54)
AST: 106 U/L — ABNORMAL HIGH (ref 15–41)
Albumin: 2.2 g/dL — ABNORMAL LOW (ref 3.5–5.0)
Anion gap: 3 — ABNORMAL LOW (ref 5–15)
BUN: 9 mg/dL (ref 6–20)
CHLORIDE: 107 mmol/L (ref 101–111)
CO2: 24 mmol/L (ref 22–32)
Calcium: 8 mg/dL — ABNORMAL LOW (ref 8.9–10.3)
Creatinine, Ser: 0.75 mg/dL (ref 0.44–1.00)
GFR calc non Af Amer: >60 mL/min (ref >60)
GLUCOSE: 83 mg/dL (ref 65–99)
POTASSIUM: 3.9 mmol/L (ref 3.5–5.1)
Sodium: 134 mmol/L — ABNORMAL LOW (ref 135–145)
Total Bilirubin: 0.4 mg/dL (ref 0.3–1.2)
Total Protein: 9.2 g/dL — ABNORMAL HIGH (ref 6.5–8.1)

## 2014-09-15 LAB — CBC
HCT: 24.4 % — ABNORMAL LOW (ref 36.0–46.0)
HEMATOCRIT: 23.6 % — AB (ref 36.0–46.0)
HEMOGLOBIN: 7.3 g/dL — AB (ref 12.0–15.0)
Hemoglobin: 7.4 g/dL — ABNORMAL LOW (ref 12.0–15.0)
MCH: 26.1 pg (ref 26.0–34.0)
MCH: 26.6 pg (ref 26.0–34.0)
MCHC: 30.3 g/dL (ref 30.0–36.0)
MCHC: 30.9 g/dL (ref 30.0–36.0)
MCV: 85.9 fL (ref 78.0–100.0)
MCV: 86.1 fL (ref 78.0–100.0)
Platelets: 255 10*3/uL (ref 150–400)
Platelets: 243 10*3/uL (ref 150–400)
RBC: 2.84 MIL/uL — ABNORMAL LOW (ref 3.87–5.11)
RBC: 2.74 MIL/uL — ABNORMAL LOW (ref 3.87–5.11)
RDW: 18.5 % — ABNORMAL HIGH (ref 11.5–15.5)
RDW: 18.7 % — AB (ref 11.5–15.5)
WBC: 45 10*3/uL — ABNORMAL HIGH (ref 4.0–10.5)
WBC: 44.6 10*3/uL — AB (ref 4.0–10.5)

## 2014-09-15 LAB — URINALYSIS, ROUTINE W REFLEX MICROSCOPIC
BILIRUBIN URINE: NEGATIVE
Glucose, UA: NEGATIVE mg/dL
KETONES UR: NEGATIVE mg/dL
NITRITE: NEGATIVE
Protein, ur: 30 mg/dL — AB
Specific Gravity, Urine: 1.03 (ref 1.005–1.030)
UROBILINOGEN UA: 0.2 mg/dL (ref 0.0–1.0)
pH: 5.5 (ref 5.0–8.0)

## 2014-09-15 LAB — URINE MICROSCOPIC-ADD ON

## 2014-09-15 LAB — RAPID URINE DRUG SCREEN, HOSP PERFORMED
Amphetamines: NOT DETECTED
Barbiturates: NOT DETECTED
Benzodiazepines: NOT DETECTED
COCAINE: NOT DETECTED
OPIATES: POSITIVE — AB
Tetrahydrocannabinol: NOT DETECTED

## 2014-09-15 LAB — FERRITIN: Ferritin: 82 ng/mL (ref 11–307)

## 2014-09-15 LAB — LACTATE DEHYDROGENASE: LDH: 753 U/L — ABNORMAL HIGH (ref 98–192)

## 2014-09-15 LAB — MONONUCLEOSIS SCREEN: Mono Screen: NEGATIVE

## 2014-09-15 LAB — ETHANOL

## 2014-09-15 LAB — GLUCOSE, CAPILLARY
GLUCOSE-CAPILLARY: 64 mg/dL — AB (ref 65–99)
GLUCOSE-CAPILLARY: 77 mg/dL (ref 65–99)

## 2014-09-15 LAB — PATHOLOGIST SMEAR REVIEW

## 2014-09-15 LAB — PROTIME-INR
INR: 1.18 (ref 0.00–1.49)
Prothrombin Time: 15.2 seconds (ref 11.6–15.2)

## 2014-09-15 LAB — MRSA PCR SCREENING: MRSA by PCR: NEGATIVE

## 2014-09-15 LAB — APTT: APTT: 37 s (ref 24–37)

## 2014-09-15 LAB — LACTIC ACID, PLASMA
LACTIC ACID, VENOUS: 0.7 mmol/L (ref 0.5–2.0)
Lactic Acid, Venous: 0.7 mmol/L (ref 0.5–2.0)

## 2014-09-15 LAB — RETICULOCYTES
RBC.: 2.74 MIL/uL — ABNORMAL LOW (ref 3.87–5.11)
RETIC CT PCT: 1.8 % (ref 0.4–3.1)
Retic Count, Absolute: 49.3 10*3/uL (ref 19.0–186.0)

## 2014-09-15 LAB — IRON AND TIBC
Iron: 218 ug/dL — ABNORMAL HIGH (ref 28–170)
Saturation Ratios: 92 % — ABNORMAL HIGH (ref 10.4–31.8)
TIBC: 238 ug/dL — ABNORMAL LOW (ref 250–450)
UIBC: 20 ug/dL

## 2014-09-15 LAB — PROCALCITONIN: Procalcitonin: 0.1 ng/mL

## 2014-09-15 LAB — VITAMIN B12: Vitamin B-12: 480 pg/mL (ref 180–914)

## 2014-09-15 LAB — TSH: TSH: 3.989 u[IU]/mL (ref 0.350–4.500)

## 2014-09-15 MED ORDER — DEXTROSE 50 % IV SOLN
INTRAVENOUS | Status: AC
  Filled 2014-09-15: qty 50

## 2014-09-15 MED ORDER — CETYLPYRIDINIUM CHLORIDE 0.05 % MT LIQD
7.0000 mL | Freq: Two times a day (BID) | OROMUCOSAL | Status: DC
  Administered 2014-09-15 – 2014-09-17 (×5): 7 mL via OROMUCOSAL

## 2014-09-15 MED ORDER — BOOST / RESOURCE BREEZE PO LIQD
1.0000 | Freq: Two times a day (BID) | ORAL | Status: DC
  Administered 2014-09-15 – 2014-09-17 (×4): 1 via ORAL

## 2014-09-15 NOTE — ED Notes (Signed)
Patient transported to X-ray 

## 2014-09-15 NOTE — Progress Notes (Signed)
CBg-64mg /dl, asymptomatic,  Npo, dextrose 50% 25 mg iv. Repeat CBg-77 mg/dl. md notified.

## 2014-09-15 NOTE — Progress Notes (Signed)
Initial Nutrition Assessment  DOCUMENTATION CODES:   Not applicable  INTERVENTION:   -RD will follow for diet advancement and supplement diet as appropriate  NUTRITION DIAGNOSIS:   Inadequate oral intake related to inability to eat as evidenced by NPO status.  GOAL:   Patient will meet greater than or equal to 90% of their needs  MONITOR:   PO intake, Supplement acceptance, Diet advancement, Labs, Weight trends, Skin, I & O's  REASON FOR ASSESSMENT:   Malnutrition Screening Tool    ASSESSMENT:   Sheryl [name] is a 44 y.o. female with PMH of GERD, chronic bilateral leg numbness, right hydronephrosis, s/p of ureteral stent placement, hepatitis B, who presents with nausea, vomiting and abdominal pain.   Pt admitted for nausea, vomiting, and abdominal pain.   Spoke with RN, who confirms pt is NPO. Pt blood sugar dropped to 64; she is on daily CBGS, but no hx of diabetes.  Hx obtained mainly by pt daughter at bedside, at pt was drowsy at time of visit. Pt daughter reports poor appetite and weight loss over the past 3 months. Prior to this pt was eating and drinking well; UBW: 130#. They estimate an 8# (6.5%) wt loss over the past 3 months.   Pt daughter reports pt has not eaten anything over the past 3 days, due to nausea and vomiting. She reveals that pt has been eating less over the past 3 months due to abdominal pain and intermittent vomiting after consuming food. Typical meal intake is 2 meals per day- diet is high in rice, lean protein, and vegetables. Pt consumes mostly milk and juice. Pt was no consuming nutritional supplements at home, however, may consider them once diet is advanced. Pt reveals that she is scared to eat or drink anything currently, in fear that she may vomit.   Nutrition-Focused physical exam completed. Findings are no fat depletion, mild muscle depletion, and no edema. Pt daughter reports pt has been progressively less active due to weakness.   Addendum: Pt  was just advanced to a clear liquid diet. Will add Boost Breeze po BID, each supplement provides 250 kcal and 9 grams of protein.  Diet Order:  Diet clear liquid Room service appropriate?: Yes; Fluid consistency:: Thin  Skin:  Reviewed, no issues  Last BM:  09/13/14  Height:   Ht Readings from Last 1 Encounters:  09/15/14 5\' 2"  (1.575 m)    Weight:   Wt Readings from Last 1 Encounters:  09/15/14 115 lb 1.3 oz (52.2 kg)    Ideal Body Weight:  50 kg  BMI:  Body mass index is 21.04 kg/(m^2).  Estimated Nutritional Needs:   Kcal:  1400-1600  Protein:  65-75 grams  Fluid:  1.4-1.6 L  EDUCATION NEEDS:   Education needs addressed  Yeison Sippel A. Jimmye Norman, RD, LDN, CDE Pager: [phone] After hours Pager: [phone]

## 2014-09-15 NOTE — Progress Notes (Signed)
Utilization review completed. Matteus Mcnelly, RN, BSN. 

## 2014-09-15 NOTE — Evaluation (Signed)
Physical Therapy Evaluation Patient Details Name: Sheryl [name] MRN: [id number] DOB: January 31, 1971 Today's Date: 09/15/2014   History of Present Illness  Sheryl [name] is a 44 y.o. female with PMH of GERD, chronic bilateral leg numbness, right hydronephrosis, s/p of ureteral stent placement, hepatitis B, who presents with nausea, vomiting and abdominal pain.   Clinical Impression  Pt admitted with above diagnosis. Pt currently with functional limitations due to the deficits listed below (see PT Problem List). Pt reported dizziness and fatigue with ambulation but no LOB however did not challenge pt.  Will have 24 hour care by parents therefore should be able to d/c home with them when medically stable.  Should not need equipment or [place] [name]/u.  Will follow acutely.   Pt will benefit from skilled PT to increase their independence and safety with mobility to allow discharge to the venue listed below.      Follow Up Recommendations Supervision/Assistance - 24 hour;No PT follow up (Most likely no HHPT needed)    Equipment Recommendations  None recommended by PT    Recommendations for Other Services       Precautions / Restrictions Precautions Precautions: Fall Restrictions Weight Bearing Restrictions: No      Mobility  Bed Mobility Overal bed mobility: Independent                Transfers Overall transfer level: Needs assistance Equipment used: None Transfers: Sit to/from Stand Sit to Stand: Min guard            Ambulation/Gait Ambulation/Gait assistance: Min guard Ambulation Distance (Feet): 400 Feet Assistive device: None Gait Pattern/deviations: Step-through pattern;Decreased stride length   Gait velocity interpretation: Below normal speed for age/gender General Gait Details: Pt ambulated without device with no LOB however no challenges given.    Stairs            Wheelchair Mobility    Modified Rankin (Stroke Patients Only)       Balance Overall balance  assessment: Needs assistance;History of Falls Sitting-balance support: No upper extremity supported;Feet supported Sitting balance-Leahy Scale: Fair     Standing balance support: No upper extremity supported;During functional activity Standing balance-Leahy Scale: Poor Standing balance comment: Pt needed steadying assist upon initial stand as she had not gotten up in a few days.  Slightly unsteady on feet.                               Pertinent Vitals/Pain Pain Assessment: No/denies pain  119/60, 84 bpm, 100% on RA.      Home Living Family/patient expects to be discharged to:: Private residence Living Arrangements: Parent;Children Available Help at Discharge: Family;Available 24 hours/day Type of Home: House Home Access: Level entry     Home Layout: One level Home Equipment: None      Prior Function Level of Independence: Independent         Comments: Pt worked PTA.     Hand Dominance        Extremity/Trunk Assessment   Upper Extremity Assessment: Defer to OT evaluation           Lower Extremity Assessment: RLE deficits/detail;LLE deficits/detail      Cervical / Trunk Assessment: Normal  Communication   Communication: Prefers language other than [place];Interpreter utilized ([place], used interpreter via phone [phone].  )  Cognition Arousal/Alertness: Awake/alert Behavior During Therapy: Anxious Overall Cognitive Status: Within Functional Limits for tasks assessed  General Comments General comments (skin integrity, edema, etc.): Pt confused by why she is still in hospital and nursing to call MD to get interpreter and explain again to pt reasons for still being in hospital.      Exercises        Assessment/Plan    PT Assessment Patient needs continued PT services  PT Diagnosis Generalized weakness   PT Problem List Decreased activity tolerance;Decreased balance;Decreased mobility  PT Treatment  Interventions DME instruction;Gait training;Functional mobility training;Therapeutic activities;Therapeutic exercise;Balance training;Patient/family education   PT Goals (Current goals can be found in the Care Plan section) Acute Rehab PT Goals Patient Stated Goal: to be independent PT Goal Formulation: With patient Time For Goal Achievement: 09/29/14 Potential to Achieve Goals: Good    Frequency Min 3X/week   Barriers to discharge        Co-evaluation               End of Session Equipment Utilized During Treatment: Gait belt Activity Tolerance: Patient limited by fatigue Patient left: in chair;with call bell/phone within reach;with family/visitor present (getting ready to transfer to 5W.) Nurse Communication: Mobility status         Time: 1497-0263 PT Time Calculation (min) (ACUTE ONLY): 39 min   Charges:   PT Evaluation $Initial PT Evaluation Tier I: 1 Procedure PT Treatments $Gait Training: 8-22 mins $Self Care/Home Management: 8-22   PT G CodesIrwin Brakeman [name] 09-30-14, 4:01 PM [place] Sheryl [name],PT Acute Rehabilitation [phone] (pager)

## 2014-09-15 NOTE — Progress Notes (Signed)
Transferred to [place] room 05 by wheelchair, stable, report given to RN. Belongings with pt.

## 2014-09-15 NOTE — Progress Notes (Signed)
[facility] PROGRESS NOTE  Kynsley Whitehouse EVO:[id number] DOB: 1970-12-01 DOA: 09/14/2014 PCP: No primary care provider on file.  Assessment/Plan:  Principal Problem:   Nausea & vomiting:  Resolved. Start clears. Suspect viral gastroenteritis.  Transfer to floor Active Problems:   Leukocytosis: follow.  No bacterial infection found.  Repeat UA unimpressive, and no urinary signs.  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 Suspect gastritis/esophagitis. Continue proton pump inhibitor.   Abnormal LFTs: Follow. Mono screen negative.   GERD (gastroesophageal reflux disease)   Hepatitis B   Bilateral leg numbness: B-12 normal.   Normocytic anemia: No evidence of bleeding. Worse after dilution. Anemia panel thus far consistent with chronic disease. Hemoccults stools pending. Follow  HPI/Subjective: Hungry and thirsty. Some epigastric pain, but improved. Wondering when she can go home.  Objective: Filed Vitals:   09/15/14 1200  BP: 99/60  Pulse: 65  Temp:   Resp: 14    Intake/Output Summary (Last 24 hours) at 09/15/14 1219 Last data filed at 09/15/14 1200  Gross per 24 hour  Intake    700 ml  Output    300 ml  Net    400 ml   Filed Weights   09/15/14 0500  Weight: 52.2 kg (115 lb 1.3 oz)    Exam:   General:  Asleep. Arousable. Comfortable appearing. Speaks some English.  HEENT: Sclera nonicteric. Moist mucous membranes.  Cardiovascular: Regular rate rhythm without murmurs gallops rubs  Respiratory: Clear to auscultation bilaterally without wheezes rhonchi or rales  Abdomen: Soft nontender nondistended normoactive bowel sounds  Ext: No clubbing cyanosis or edema  Basic Metabolic Panel:  Recent Labs Lab 09/14/14 1500 09/15/14 0424  NA 136 134*  K 4.0 3.9  CL 106 107  CO2 27 24  GLUCOSE 97 83  BUN 11 9  CREATININE 0.76 0.75  CALCIUM 9.2 8.0*   Liver Function Tests:  Recent Labs Lab 09/14/14 1500  09/15/14 0424  AST 152* 106*  ALT 17 13*  ALKPHOS 58 45  BILITOT 0.3 0.4  PROT 10.3* 9.2*  ALBUMIN 2.7* 2.2*    Recent Labs Lab 09/14/14 1500  LIPASE 22   No results for input(s): AMMONIA in the last 168 hours. CBC:  Recent Labs Lab 09/14/14 1500 09/15/14 0424 09/15/14 1006  WBC 54.1* 47.2*  45.0* 44.6*  NEUTROABS  --  17.9*  --   HGB 8.1* 7.5*  7.4* 7.3*  HCT 26.0* 24.3*  24.4* 23.6*  MCV 84.7 86.5  85.9 86.1  PLT 310 263  255 243   Cardiac Enzymes: No results for input(s): CKTOTAL, CKMB, CKMBINDEX, TROPONINI in the last 168 hours. BNP (last 3 results) No results for input(s): BNP in the last 8760 hours.  ProBNP (last 3 results) No results for input(s): PROBNP in the last 8760 hours.  CBG:  Recent Labs Lab 09/15/14 0731 09/15/14 0840  GLUCAP 64* 77    Recent Results (from the past 240 hour(s))  MRSA PCR Screening     Status: None   Collection Time: 09/15/14  5:02 AM  Result Value Ref Range Status   MRSA by PCR NEGATIVE NEGATIVE Final    Comment:        The GeneXpert MRSA Assay (FDA approved for NASAL specimens only), is one component of a comprehensive MRSA colonization surveillance program. It is not intended to diagnose MRSA infection nor to guide or monitor treatment for MRSA infections.      Studies: Dg  Chest 2 View  09/15/2014   CLINICAL DATA:  Three-day history abdominal pain with nausea and vomiting ; cough  EXAM: CHEST  2 VIEW  COMPARISON:  September 25, 2012  FINDINGS: There is no edema or consolidation. Heart is upper normal in size with pulmonary vascularity within normal limits. No adenopathy. No bone lesions.  IMPRESSION: No edema or consolidation.   Electronically Signed   By: Lowella Grip III M.D.   On: 09/15/2014 00:44   Ct Abdomen Pelvis W Contrast  09/14/2014   CLINICAL DATA:  Three-day history abdominal pain with nausea, vomiting, and diarrhea  EXAM: CT ABDOMEN AND PELVIS WITH CONTRAST  TECHNIQUE: Multidetector CT  imaging of the abdomen and pelvis was performed using the standard protocol following bolus administration of intravenous contrast. Oral contrast was also administered.  CONTRAST:  152mL OMNIPAQUE IOHEXOL 300 MG/ML  SOLN  COMPARISON:  September 25, 2012  FINDINGS: There is bibasilar atelectatic change.  Liver is prominent, measuring 19.4 cm in length. There is a 9 x 9 mm cyst in the right lobe of the liver posteriorly. No other focal liver lesion is identified. Gallbladder wall is not thickened. There is no biliary duct dilatation.  There is a stable complex multi-cystic structure along the lateral aspect of the spleen measuring 9.0 x 9.0 x 2.8 cm. The spleen itself appears within normal limits and stable.  Pancreas and adrenals appear normal.  There is a left parapelvic cyst measuring 1.8 x 1.7 cm. There is a 1 x 1 cm cyst in the periphery of the mid left kidney. There is a 7 mm cyst in the periphery of the right kidney. There is an 8 mm cyst in the medial mid right kidney. There is no appreciable hydronephrosis on either side. There is a nonobstructing calculus in the lower pole of the left kidney measuring 1.8 x 1.2 cm. There is a nonobstructing 2 mm calculus in the periphery of the mid right kidney. There is no ureteral calculus on either side.  In the pelvis, the urinary bladder is midline with normal wall thickness. Uterus is anteverted. There is a small amount of free fluid in the pelvis. There is fluid tracking posterior from the left ovary as well. There is no appreciable pelvic mass.  Appendix appears normal.  There is no bowel obstruction. No free air or portal venous air. There is no ascites, adenopathy, or abscess in the abdomen or pelvis. There is no abdominal aortic aneurysm. There are no blastic or lytic bone lesions. There are stable gluteal region calcifications bilaterally. There is spinal stenosis at L4-5 due to bony hypertrophy and disc protrusion.  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 The stability of this lesion suggests benign etiology. Major differential considerations include lymphangioma or residua of previous trauma with liquified hematoma.  Nonobstructing calculi in each kidney. No hydronephrosis on either side. No ureteral calculi.  Small amount of fluid adjacent to the left ovary is with a small amount of free fluid in the cul-de-sac. Suspect recent ovarian cyst rupture.  Prominent liver.  Small liver cyst.  No other focal liver lesion.  No bowel obstruction.  No abscess.  Appendix appears normal.  Spinal stenosis at L4-5 due to disc protrusion and bony hypertrophy.   Electronically Signed   By: Lowella Grip III M.D.   On: 09/14/2014 18:54   US Abdomen Limited Ruq  09/14/2014   CLINICAL  DATA:  44 year old female with right upper quadrant abdominal pain.  EXAM: US ABDOMEN LIMITED - RIGHT UPPER QUADRANT  COMPARISON:  CT the abdomen and pelvis 09/14/2014.  FINDINGS: Gallbladder:  No gallstones or wall thickening visualized. No sonographic Murphy sign noted.  Common bile duct:  Diameter: 2.9 mm in the porta hepatis  Liver:  No focal lesion identified. Within normal limits in parenchymal echogenicity.  IMPRESSION: No acute findings. Specifically, no gallstones or findings to suggest an acute cholecystitis at this time.   Electronically Signed   By: Vinnie Langton M.D.   On: 09/14/2014 21:13    Scheduled Meds: . sodium chloride   Intravenous STAT  . antiseptic oral rinse  7 mL Mouth Rinse BID  . heparin  5,000 Units Subcutaneous 3 times per day  . pantoprazole (PROTONIX) IV  40 mg Intravenous Q24H  . sodium chloride  3 mL Intravenous Q12H   Continuous Infusions:   Time spent: 25 minutes  [facility] [email or web address], password [facility] 09/15/2014, 12:19 PM  LOS: 1 day

## 2014-09-16 LAB — CBC WITH DIFFERENTIAL/PLATELET
BAND NEUTROPHILS: 20 % — AB (ref 0–10)
BASOS PCT: 1 % (ref 0–1)
Basophils Absolute: 0.5 10*3/uL — ABNORMAL HIGH (ref 0.0–0.1)
Blasts: 0 %
EOS ABS: 0.9 10*3/uL — AB (ref 0.0–0.7)
Eosinophils Relative: 2 % (ref 0–5)
HCT: 23.9 % — ABNORMAL LOW (ref 36.0–46.0)
Hemoglobin: 7.3 g/dL — ABNORMAL LOW (ref 12.0–15.0)
LYMPHS ABS: 24 10*3/uL — AB (ref 0.7–4.0)
Lymphocytes Relative: 51 % — ABNORMAL HIGH (ref 12–46)
MCH: 26.4 pg (ref 26.0–34.0)
MCHC: 30.5 g/dL (ref 30.0–36.0)
MCV: 86.3 fL (ref 78.0–100.0)
MONOS PCT: 5 % (ref 3–12)
Metamyelocytes Relative: 4 %
Monocytes Absolute: 2.4 10*3/uL — ABNORMAL HIGH (ref 0.1–1.0)
Myelocytes: 0 %
NEUTROS ABS: 19.3 10*3/uL — AB (ref 1.7–7.7)
NRBC: 1 /100{WBCs} — AB
Neutrophils Relative %: 17 % — ABNORMAL LOW (ref 43–77)
OTHER: 0 %
PLATELETS: 272 10*3/uL (ref 150–400)
Promyelocytes Absolute: 0 %
RBC: 2.77 MIL/uL — ABNORMAL LOW (ref 3.87–5.11)
RDW: 18.5 % — ABNORMAL HIGH (ref 11.5–15.5)
WBC: 47.1 10*3/uL — ABNORMAL HIGH (ref 4.0–10.5)

## 2014-09-16 LAB — COMPREHENSIVE METABOLIC PANEL
ALBUMIN: 2.1 g/dL — AB (ref 3.5–5.0)
ALK PHOS: 45 U/L (ref 38–126)
ALT: 12 U/L — ABNORMAL LOW (ref 14–54)
ANION GAP: 1 — AB (ref 5–15)
AST: 77 U/L — ABNORMAL HIGH (ref 15–41)
BUN: <5 mg/dL — ABNORMAL LOW (ref 6–20)
CHLORIDE: 106 mmol/L (ref 101–111)
CO2: 26 mmol/L (ref 22–32)
Calcium: 8.1 mg/dL — ABNORMAL LOW (ref 8.9–10.3)
Creatinine, Ser: 0.72 mg/dL (ref 0.44–1.00)
GFR calc Af Amer: >60 mL/min (ref >60)
GFR calc non Af Amer: >60 mL/min (ref >60)
GLUCOSE: 65 mg/dL (ref 65–99)
POTASSIUM: 3.2 mmol/L — AB (ref 3.5–5.1)
SODIUM: 133 mmol/L — AB (ref 135–145)
Total Bilirubin: 0.4 mg/dL (ref 0.3–1.2)
Total Protein: 9.2 g/dL — ABNORMAL HIGH (ref 6.5–8.1)

## 2014-09-16 LAB — URINE CULTURE

## 2014-09-16 LAB — GLUCOSE, CAPILLARY: Glucose-Capillary: 60 mg/dL — ABNORMAL LOW (ref 65–99)

## 2014-09-16 MED ORDER — ALUM & MAG HYDROXIDE-SIMETH 200-200-20 MG/5ML PO SUSP
30.0000 mL | ORAL | Status: DC | PRN
  Administered 2014-09-16: 30 mL via ORAL
  Filled 2014-09-16: qty 30

## 2014-09-16 MED ORDER — SODIUM CHLORIDE 0.9 % IV BOLUS (SEPSIS)
1000.0000 mL | Freq: Once | INTRAVENOUS | Status: AC
  Administered 2014-09-16: 1000 mL via INTRAVENOUS

## 2014-09-16 MED ORDER — PANTOPRAZOLE SODIUM 40 MG PO TBEC
40.0000 mg | DELAYED_RELEASE_TABLET | Freq: Two times a day (BID) | ORAL | Status: DC
  Administered 2014-09-16 – 2014-09-17 (×3): 40 mg via ORAL
  Filled 2014-09-16 (×3): qty 1

## 2014-09-16 MED ORDER — OXYCODONE HCL 5 MG PO TABS
5.0000 mg | ORAL_TABLET | Freq: Four times a day (QID) | ORAL | Status: DC | PRN
  Administered 2014-09-16 – 2014-09-17 (×2): 5 mg via ORAL
  Filled 2014-09-16 (×2): qty 1

## 2014-09-16 NOTE — Progress Notes (Signed)
[facility] PROGRESS NOTE  Sheryl [name] YKZ:[id number] DOB: December 26, 1970 DOA: 09/14/2014 PCP: No primary care provider on file.  Assessment/Plan:  Principal Problem:   Nausea & vomiting:  Resolved. Tolerating clears, but feels weak. Advance diet. Resume IVF, ambulate and up with PT. Possibly home in am Active Problems:   Leukocytosis: follow.  No bacterial infection found.  Repeat UA unimpressive, and no urinary signs.  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 Will need recheck as outpt and if still high, referral to heme onc   Abdominal pain, epigastric. Suspect gastritis/esophagitis. Change ppi to po bid   Abnormal LFTs: Follow. Mono screen negative.   GERD (gastroesophageal reflux disease)   Hepatitis B   Bilateral leg numbness: B-12 normal.   Normocytic anemia: No evidence of bleeding. Worse after dilution. Anemia panel thus far consistent with chronic disease. Hemoccults stools pending. Follow  HPI/Subjective: Weak. No vomiting or diarrhea. Still with epigastric discomfort  Objective: Filed Vitals:   09/16/14 0545  BP: 112/67  Pulse: 72  Temp: 97.7 F (36.5 C)  Resp: 18    Intake/Output Summary (Last 24 hours) at 09/16/14 1004 Last data filed at 09/15/14 1500  Gross per 24 hour  Intake    737 ml  Output      0 ml  Net    737 ml   Filed Weights   09/15/14 0500  Weight: 52.2 kg (115 lb 1.3 oz)    Exam:   General:  Alert. comfortable  HEENT: Sclera nonicteric. Moist mucous membranes.  Cardiovascular: Regular rate rhythm without murmurs gallops rubs  Respiratory: Clear to auscultation bilaterally without wheezes rhonchi or rales  Abdomen: Soft nontender nondistended normoactive bowel sounds  Ext: No clubbing cyanosis or edema  Basic Metabolic Panel:  Recent Labs Lab 09/14/14 1500 09/15/14 0424  NA 136 134*  K 4.0 3.9  CL 106 107  CO2 27 24  GLUCOSE 97 83  BUN 11 9  CREATININE 0.76 0.75  CALCIUM 9.2 8.0*   Liver Function  Tests:  Recent Labs Lab 09/14/14 1500 09/15/14 0424  AST 152* 106*  ALT 17 13*  ALKPHOS 58 45  BILITOT 0.3 0.4  PROT 10.3* 9.2*  ALBUMIN 2.7* 2.2*    Recent Labs Lab 09/14/14 1500  LIPASE 22   No results for input(s): AMMONIA in the last 168 hours. CBC:  Recent Labs Lab 09/14/14 1500 09/15/14 0424 09/15/14 1006 09/16/14 0730  WBC 54.1* 47.2*  45.0* 44.6* PENDING  NEUTROABS  --  17.9*  --  PENDING  HGB 8.1* 7.5*  7.4* 7.3* 7.3*  HCT 26.0* 24.3*  24.4* 23.6* 23.9*  MCV 84.7 86.5  85.9 86.1 86.3  PLT 310 263  255 243 272   Cardiac Enzymes: No results for input(s): CKTOTAL, CKMB, CKMBINDEX, TROPONINI in the last 168 hours. BNP (last 3 results) No results for input(s): BNP in the last 8760 hours.  ProBNP (last 3 results) No results for input(s): PROBNP in the last 8760 hours.  CBG:  Recent Labs Lab 09/15/14 0731 09/15/14 0840 09/16/14 0746  GLUCAP 64* 77 60*    Recent Results (from the past 240 hour(s))  MRSA PCR Screening     Status: None   Collection Time: 09/15/14  5:02 AM  Result Value Ref Range Status   MRSA by PCR NEGATIVE NEGATIVE Final    Comment:        The GeneXpert MRSA Assay (FDA approved for NASAL specimens only), is one component of a  comprehensive MRSA colonization surveillance program. It is not intended to diagnose MRSA infection nor to guide or monitor treatment for MRSA infections.      Studies: Dg Chest 2 View  09/15/2014   CLINICAL DATA:  Three-day history abdominal pain with nausea and vomiting ; cough  EXAM: CHEST  2 VIEW  COMPARISON:  September 25, 2012  FINDINGS: There is no edema or consolidation. Heart is upper normal in size with pulmonary vascularity within normal limits. No adenopathy. No bone lesions.  IMPRESSION: No edema or consolidation.   Electronically Signed   By: Lowella Grip III M.D.   On: 09/15/2014 00:44   Ct Abdomen Pelvis W Contrast  09/14/2014   CLINICAL DATA:  Three-day history abdominal pain  with nausea, vomiting, and diarrhea  EXAM: CT ABDOMEN AND PELVIS WITH CONTRAST  TECHNIQUE: Multidetector CT imaging of the abdomen and pelvis was performed using the standard protocol following bolus administration of intravenous contrast. Oral contrast was also administered.  CONTRAST:  183mL OMNIPAQUE IOHEXOL 300 MG/ML  SOLN  COMPARISON:  September 25, 2012  FINDINGS: There is bibasilar atelectatic change.  Liver is prominent, measuring 19.4 cm in length. There is a 9 x 9 mm cyst in the right lobe of the liver posteriorly. No other focal liver lesion is identified. Gallbladder wall is not thickened. There is no biliary duct dilatation.  There is a stable complex multi-cystic structure along the lateral aspect of the spleen measuring 9.0 x 9.0 x 2.8 cm. The spleen itself appears within normal limits and stable.  Pancreas and adrenals appear normal.  There is a left parapelvic cyst measuring 1.8 x 1.7 cm. There is a 1 x 1 cm cyst in the periphery of the mid left kidney. There is a 7 mm cyst in the periphery of the right kidney. There is an 8 mm cyst in the medial mid right kidney. There is no appreciable hydronephrosis on either side. There is a nonobstructing calculus in the lower pole of the left kidney measuring 1.8 x 1.2 cm. There is a nonobstructing 2 mm calculus in the periphery of the mid right kidney. There is no ureteral calculus on either side.  In the pelvis, the urinary bladder is midline with normal wall thickness. Uterus is anteverted. There is a small amount of free fluid in the pelvis. There is fluid tracking posterior from the left ovary as well. There is no appreciable pelvic mass.  Appendix appears normal.  There is no bowel obstruction. No free air or portal venous air. There is no ascites, adenopathy, or abscess in the abdomen or pelvis. There is no abdominal aortic aneurysm. There are no blastic or lytic bone lesions. There are stable gluteal region calcifications bilaterally. There is spinal  stenosis at L4-5 due to bony hypertrophy and disc protrusion.  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 The stability of this lesion suggests benign etiology. Major differential considerations include lymphangioma or residua of previous trauma with liquified hematoma.  Nonobstructing calculi in each kidney. No hydronephrosis on either side. No ureteral calculi.  Small amount of fluid adjacent to the left ovary is with a small amount of free fluid in the cul-de-sac. Suspect recent ovarian cyst rupture.  Prominent liver.  Small liver cyst.  No other focal liver lesion.  No bowel obstruction.  No abscess.  Appendix appears normal.  Spinal stenosis at L4-5 due to disc protrusion and bony  hypertrophy.   Electronically Signed   By: Lowella Grip III M.D.   On: 09/14/2014 18:54   US Abdomen Limited Ruq  09/14/2014   CLINICAL DATA:  44 year old female with right upper quadrant abdominal pain.  EXAM: US ABDOMEN LIMITED - RIGHT UPPER QUADRANT  COMPARISON:  CT the abdomen and pelvis 09/14/2014.  FINDINGS: Gallbladder:  No gallstones or wall thickening visualized. No sonographic Murphy sign noted.  Common bile duct:  Diameter: 2.9 mm in the porta hepatis  Liver:  No focal lesion identified. Within normal limits in parenchymal echogenicity.  IMPRESSION: No acute findings. Specifically, no gallstones or findings to suggest an acute cholecystitis at this time.   Electronically Signed   By: Vinnie Langton M.D.   On: 09/14/2014 21:13    Scheduled Meds: . antiseptic oral rinse  7 mL Mouth Rinse BID  . feeding supplement  1 Container Oral BID BM  . heparin  5,000 Units Subcutaneous 3 times per day  . pantoprazole (PROTONIX) IV  40 mg Intravenous Q24H  . sodium chloride  3 mL Intravenous Q12H   Continuous Infusions:   Time spent: 25 minutes  [facility] [email or web address], password  [facility] 09/16/2014, 10:04 AM  LOS: 2 days

## 2014-09-17 DIAGNOSIS — R109 Unspecified abdominal pain: Secondary | ICD-10-CM

## 2014-09-17 DIAGNOSIS — K5909 Other constipation: Secondary | ICD-10-CM

## 2014-09-17 DIAGNOSIS — K5900 Constipation, unspecified: Secondary | ICD-10-CM | POA: Diagnosis present

## 2014-09-17 LAB — CBC WITH DIFFERENTIAL/PLATELET
BASOS PCT: 0 % (ref 0–1)
BLASTS: 0 %
Band Neutrophils: 14 % — ABNORMAL HIGH (ref 0–10)
Basophils Absolute: 0 10*3/uL (ref 0.0–0.1)
Eosinophils Absolute: 0.5 10*3/uL (ref 0.0–0.7)
Eosinophils Relative: 1 % (ref 0–5)
HEMATOCRIT: 22.5 % — AB (ref 36.0–46.0)
Hemoglobin: 7 g/dL — ABNORMAL LOW (ref 12.0–15.0)
LYMPHS ABS: 24.6 10*3/uL — AB (ref 0.7–4.0)
LYMPHS PCT: 52 % — AB (ref 12–46)
MCH: 26.5 pg (ref 26.0–34.0)
MCHC: 31.1 g/dL (ref 30.0–36.0)
MCV: 85.2 fL (ref 78.0–100.0)
METAMYELOCYTES PCT: 3 %
Monocytes Absolute: 3.3 10*3/uL — ABNORMAL HIGH (ref 0.1–1.0)
Monocytes Relative: 7 % (ref 3–12)
Myelocytes: 0 %
NEUTROS PCT: 23 % — AB (ref 43–77)
Neutro Abs: 19 10*3/uL — ABNORMAL HIGH (ref 1.7–7.7)
Other: 0 %
PLATELETS: 270 10*3/uL (ref 150–400)
Promyelocytes Absolute: 0 %
RBC: 2.64 MIL/uL — ABNORMAL LOW (ref 3.87–5.11)
RDW: 18.4 % — AB (ref 11.5–15.5)
WBC: 47.4 10*3/uL — AB (ref 4.0–10.5)
nRBC: 0 /100 WBC

## 2014-09-17 LAB — COMPREHENSIVE METABOLIC PANEL
ALT: 12 U/L — AB (ref 14–54)
ANION GAP: 2 — AB (ref 5–15)
AST: 93 U/L — ABNORMAL HIGH (ref 15–41)
Albumin: 2.1 g/dL — ABNORMAL LOW (ref 3.5–5.0)
Alkaline Phosphatase: 46 U/L (ref 38–126)
BUN: <5 mg/dL — ABNORMAL LOW (ref 6–20)
CALCIUM: 8.1 mg/dL — AB (ref 8.9–10.3)
CHLORIDE: 106 mmol/L (ref 101–111)
CO2: 26 mmol/L (ref 22–32)
Creatinine, Ser: 0.72 mg/dL (ref 0.44–1.00)
GFR calc non Af Amer: >60 mL/min (ref >60)
Glucose, Bld: 78 mg/dL (ref 65–99)
Potassium: 4 mmol/L (ref 3.5–5.1)
SODIUM: 134 mmol/L — AB (ref 135–145)
TOTAL PROTEIN: 9 g/dL — AB (ref 6.5–8.1)
Total Bilirubin: 0.6 mg/dL (ref 0.3–1.2)

## 2014-09-17 LAB — GLUCOSE, CAPILLARY: Glucose-Capillary: 68 mg/dL (ref 65–99)

## 2014-09-17 MED ORDER — POLYETHYLENE GLYCOL 3350 17 G PO PACK
17.0000 g | PACK | Freq: Every day | ORAL | Status: DC
Start: 2014-09-17 — End: 2014-10-24

## 2014-09-17 MED ORDER — MAGNESIUM HYDROXIDE 400 MG/5ML PO SUSP
30.0000 mL | Freq: Once | ORAL | Status: AC
  Administered 2014-09-17: 30 mL via ORAL
  Filled 2014-09-17: qty 30

## 2014-09-17 MED ORDER — POTASSIUM CHLORIDE 10 MEQ/100ML IV SOLN
10.0000 meq | INTRAVENOUS | Status: AC
  Administered 2014-09-17 (×2): 10 meq via INTRAVENOUS
  Filled 2014-09-17: qty 100

## 2014-09-17 NOTE — Progress Notes (Signed)
Patient discharge teaching given, including activity, diet, follow-up appoints, and medications. Patient and daughter verbalized understanding of all discharge instructions. IV access was d/c'd. Vitals are stable. Skin is intact except as charted in most recent assessments. Pt to be escorted out by NT, to be driven home by family.

## 2014-09-17 NOTE — Discharge Summary (Signed)
Physician Discharge Summary  Deamber Buckhalter AJG:[id number] DOB: 01-Dec-1970 DOA: 09/14/2014  PCP: No primary care provider on file.  Admit date: 09/14/2014 Discharge date: 09/17/2014  Time spent: greater than 30 minutes  Recommendations for Outpatient Follow-up:  1. Repeat CBC with diff. If still with elevated WBC, needs referral to hematology  Discharge Diagnoses:  Principal Problem:   Nausea & vomiting, likely viral gastroenteritis Active Problems:   Leukocytosis Abdominal cystic mass, chronic    Abnormal LFTs   GERD (gastroesophageal reflux disease)   Hepatitis B   Bilateral leg numbness   Normocytic anemia   Constipation  Discharge Condition: stable  Diet recommendation: general as tolerated  Filed Weights   09/15/14 0500  Weight: 52.2 kg (115 lb 1.3 oz)    History of present illness:  44 y.o. female with PMH of GERD, chronic bilateral leg numbness, right hydronephrosis, s/p of ureteral stent placement, hepatitis B, who presents with nausea, vomiting and abdominal pain.   Pt is a vietnamese, dose not speaking [place]. History is obtained through translator. Patient reports that she has been having nausea, vomiting and abdominal pain for more than 2 days. She does not have diarrhea. She vomited 5 times today. No blood in the vomitus. Her abdominal pain is located in the epigastric area, moderate, constant, nonradiating. It is not aggravated or alleviated by any known factors. She had chills and fever, but did not check her body temperature at home. Patient does not have chest pain or shortness, but has mild cough occasionally. She does not have dysuria, or burning on urination, but has increased urinary frequency recently. No hematuria or hematochezia. She has chronic bilateral leg numbness, which has not changed from baseline. She has generalized weakness. No rashes.   In ED, patient was found to have WBC 54.1, hemoglobin 8.1, platelet 310, lactate 1.40, lipase 22, abnormal liver  function with the AST 152, ALT 17, protein 10.3, total bilirubin 0.3, albumen 2.7, electrolytes okay. Urinalysis is dirty catch with many squamous cell, the repeated urinalysis pending.   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 The stability of this lesion suggests benign etiology. Nonobstructing calculi in each kidney, no hydronephrosis on either side. No ureteral calculi, small amount of fluid adjacent to the left ovary is with a small amount of free fluid in the cul-de-sac, small liver cyst, spinal stenosis at L4-5 due to disc protrusion and bony hypertrophy. Abd [place] is negative for acute abnormalities. Patient is admitted to inpatient for further evaluation and treatment.  Hospital Course:  Started on antiemetics, IVF, and supportive care.  Cystic mass noted on imaging stable since 2010, likely benign per radiology.  By discharge, tolerating solid diet and epigastric pain resolved. Complaining of lower abdominal pain and constipation. Recommend laxatives.  Had leukocytosis on admission.  No other clinical evidence of acute bacterial infection. Needs repeat CBC with diff in a few weeks with PCP, Dr. Orpah Greek.  If still abnormal, needs referral to hematologist.  Procedures:  none  Consultations:  none  Discharge Exam: Filed Vitals:   09/17/14 0518  BP: 107/68  Pulse: 73  Temp: 98 F (36.7 C)  Resp: 18    General:  A and o. comfortable Cardiovascular: *RRR Respiratory: CTA abd s, nt,nd  Discharge Instructions   Discharge Instructions    Activity as tolerated - No restrictions    Complete by:  As directed      Diet  general    Complete by:  As directed           Current Discharge Medication List    START taking these medications   Details  polyethylene glycol (MIRALAX / GLYCOLAX) packet Take 17 g by mouth daily. Qty: 14 each, Refills: 0       No Known  Allergies Follow-up Information    Follow up with Dr. York Ram In 2 weeks.   Why:  to check white blood cell count.  If still elevated, he needs to refer you to a hematologist (blood specialist)       The results of significant diagnostics from this hospitalization (including imaging, microbiology, ancillary and laboratory) are listed below for reference.    Significant Diagnostic Studies: Dg Chest 2 View  09/15/2014   CLINICAL DATA:  Three-day history abdominal pain with nausea and vomiting ; cough  EXAM: CHEST  2 VIEW  COMPARISON:  September 25, 2012  FINDINGS: There is no edema or consolidation. Heart is upper normal in size with pulmonary vascularity within normal limits. No adenopathy. No bone lesions.  IMPRESSION: No edema or consolidation.   Electronically Signed   By: Lowella Grip III M.D.   On: 09/15/2014 00:44   Ct Abdomen Pelvis W Contrast  09/14/2014   CLINICAL DATA:  Three-day history abdominal pain with nausea, vomiting, and diarrhea  EXAM: CT ABDOMEN AND PELVIS WITH CONTRAST  TECHNIQUE: Multidetector CT imaging of the abdomen and pelvis was performed using the standard protocol following bolus administration of intravenous contrast. Oral contrast was also administered.  CONTRAST:  169mL OMNIPAQUE IOHEXOL 300 MG/ML  SOLN  COMPARISON:  September 25, 2012  FINDINGS: There is bibasilar atelectatic change.  Liver is prominent, measuring 19.4 cm in length. There is a 9 x 9 mm cyst in the right lobe of the liver posteriorly. No other focal liver lesion is identified. Gallbladder wall is not thickened. There is no biliary duct dilatation.  There is a stable complex multi-cystic structure along the lateral aspect of the spleen measuring 9.0 x 9.0 x 2.8 cm. The spleen itself appears within normal limits and stable.  Pancreas and adrenals appear normal.  There is a left parapelvic cyst measuring 1.8 x 1.7 cm. There is a 1 x 1 cm cyst in the periphery of the mid left kidney. There is a 7 mm  cyst in the periphery of the right kidney. There is an 8 mm cyst in the medial mid right kidney. There is no appreciable hydronephrosis on either side. There is a nonobstructing calculus in the lower pole of the left kidney measuring 1.8 x 1.2 cm. There is a nonobstructing 2 mm calculus in the periphery of the mid right kidney. There is no ureteral calculus on either side.  In the pelvis, the urinary bladder is midline with normal wall thickness. Uterus is anteverted. There is a small amount of free fluid in the pelvis. There is fluid tracking posterior from the left ovary as well. There is no appreciable pelvic mass.  Appendix appears normal.  There is no bowel obstruction. No free air or portal venous air. There is no ascites, adenopathy, or abscess in the abdomen or pelvis. There is no abdominal aortic aneurysm. There are no blastic or lytic bone lesions. There are stable gluteal region calcifications bilaterally. There is spinal stenosis at L4-5 due to bony hypertrophy and disc protrusion.  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 The stability of this lesion suggests benign etiology. Major differential considerations include lymphangioma or residua of previous trauma with liquified hematoma.  Nonobstructing calculi in each kidney. No hydronephrosis on either side. No ureteral calculi.  Small amount of fluid adjacent to the left ovary is with a small amount of free fluid in the cul-de-sac. Suspect recent ovarian cyst rupture.  Prominent liver.  Small liver cyst.  No other focal liver lesion.  No bowel obstruction.  No abscess.  Appendix appears normal.  Spinal stenosis at L4-5 due to disc protrusion and bony hypertrophy.   Electronically Signed   By: Lowella Grip III M.D.   On: 09/14/2014 18:54   US Abdomen Limited Ruq  09/14/2014   CLINICAL DATA:  44 year old female with right upper quadrant abdominal  pain.  EXAM: US ABDOMEN LIMITED - RIGHT UPPER QUADRANT  COMPARISON:  CT the abdomen and pelvis 09/14/2014.  FINDINGS: Gallbladder:  No gallstones or wall thickening visualized. No sonographic Murphy sign noted.  Common bile duct:  Diameter: 2.9 mm in the porta hepatis  Liver:  No focal lesion identified. Within normal limits in parenchymal echogenicity.  IMPRESSION: No acute findings. Specifically, no gallstones or findings to suggest an acute cholecystitis at this time.   Electronically Signed   By: Vinnie Langton M.D.   On: 09/14/2014 21:13    Microbiology: Recent Results (from the past 240 hour(s))  Culture, blood (routine x 2)     Status: None (Preliminary result)   Collection Time: 09/14/14 11:45 PM  Result Value Ref Range Status   Specimen Description BLOOD RIGHT FOREARM  Final   Special Requests BOTTLES DRAWN AEROBIC AND ANAEROBIC 5CC   Final   Culture NO GROWTH 1 DAY  Final   Report Status PENDING  Incomplete  Culture, blood (routine x 2)     Status: None (Preliminary result)   Collection Time: 09/14/14 11:45 PM  Result Value Ref Range Status   Specimen Description BLOOD RIGHT HAND  Final   Special Requests BOTTLES DRAWN AEROBIC AND ANAEROBIC 5CC   Final   Culture NO GROWTH 1 DAY  Final   Report Status PENDING  Incomplete  Urine culture     Status: None   Collection Time: 09/15/14  4:21 AM  Result Value Ref Range Status   Specimen Description URINE, CLEAN CATCH  Final   Special Requests NONE  Final   Culture MULTIPLE SPECIES PRESENT, SUGGEST RECOLLECTION  Final   Report Status 09/16/2014 FINAL  Final  MRSA PCR Screening     Status: None   Collection Time: 09/15/14  5:02 AM  Result Value Ref Range Status   MRSA by PCR NEGATIVE NEGATIVE Final    Comment:        The GeneXpert MRSA Assay (FDA approved for NASAL specimens only), is one component of a comprehensive MRSA colonization surveillance program. It is not intended to diagnose MRSA infection nor to guide or monitor  treatment for MRSA infections.      Labs: Basic Metabolic Panel:  Recent Labs Lab 09/14/14 1500 09/15/14 0424 09/16/14 0730 09/17/14 0802  NA 136 134* 133* 134*  K 4.0 3.9 3.2* 4.0  CL 106 107 106 106  CO2 27 24 26 26   GLUCOSE 97 83 65 78  BUN 11 9 <5* <5*  CREATININE 0.76 0.75 0.72 0.72  CALCIUM 9.2 8.0* 8.1* 8.1*   Liver Function Tests:  Recent Labs Lab 09/14/14 1500 09/15/14 0424 09/16/14 0730 09/17/14 0802  AST  152* 106* 77* 93*  ALT 17 13* 12* 12*  ALKPHOS 58 45 45 46  BILITOT 0.3 0.4 0.4 0.6  PROT 10.3* 9.2* 9.2* 9.0*  ALBUMIN 2.7* 2.2* 2.1* 2.1*    Recent Labs Lab 09/14/14 1500  LIPASE 22   No results for input(s): AMMONIA in the last 168 hours. CBC:  Recent Labs Lab 09/14/14 1500 09/15/14 0424 09/15/14 1006 09/16/14 0730  WBC 54.1* 47.2*  45.0* 44.6* 47.1*  NEUTROABS  --  17.9*  --  19.3*  HGB 8.1* 7.5*  7.4* 7.3* 7.3*  HCT 26.0* 24.3*  24.4* 23.6* 23.9*  MCV 84.7 86.5  85.9 86.1 86.3  PLT 310 263  255 243 272   Cardiac Enzymes: No results for input(s): CKTOTAL, CKMB, CKMBINDEX, TROPONINI in the last 168 hours. BNP: BNP (last 3 results) No results for input(s): BNP in the last 8760 hours.  ProBNP (last 3 results) No results for input(s): PROBNP in the last 8760 hours.  CBG:  Recent Labs Lab 09/15/14 0731 09/15/14 0840 09/16/14 0746 09/17/14 0801  GLUCAP 64* 77 60* 68       Signed:  Jessup L  [facility] 09/17/2014, 9:35 AM

## 2014-09-18 LAB — PROTEIN ELECTROPHORESIS, SERUM
A/G RATIO SPE: 0.5 — AB (ref 0.7–1.7)
ALBUMIN ELP: 3 g/dL (ref 2.9–4.4)
ALPHA-1-GLOBULIN: 0.2 g/dL (ref 0.0–0.4)
Alpha-2-Globulin: 0.9 g/dL (ref 0.4–1.0)
BETA GLOBULIN: 0.7 g/dL (ref 0.7–1.3)
GAMMA GLOBULIN: 4.7 g/dL — AB (ref 0.4–1.8)
Globulin, Total: 6.4 g/dL — ABNORMAL HIGH (ref 2.2–3.9)
M-Spike, %: 4.2 g/dL — ABNORMAL HIGH
Total Protein ELP: 9.4 g/dL — ABNORMAL HIGH (ref 6.0–8.5)

## 2014-09-18 LAB — UIFE/LIGHT CHAINS/TP QN, 24-HR UR
% BETA, Urine: 10.3 %
ALBUMIN, U: 8 %
ALPHA 1 URINE: 2.4 %
Alpha 2, Urine: 4.3 %
Free Kappa/Lambda Ratio: 0.05 — ABNORMAL LOW (ref 2.04–10.37)
Free Lambda Lt Chains,Ur: 2750 mg/L — ABNORMAL HIGH (ref 0.24–6.66)
Free Lt Chn Excr Rate: 144 mg/L — ABNORMAL HIGH (ref 1.35–24.19)
GAMMA GLOBULIN URINE: 75 %
M-SPIKE %, Urine: 62.5 % — ABNORMAL HIGH
Total Protein, Urine: 131.9 mg/dL

## 2014-09-19 LAB — HEPATITIS PANEL, ACUTE
HCV Ab: 0.4 s/co ratio (ref 0.0–0.9)
HEP A IGM: NEGATIVE
HEP B C IGM: NEGATIVE
Hepatitis B Surface Ag: POSITIVE — AB

## 2014-09-19 LAB — HIV ANTIBODY (ROUTINE TESTING W REFLEX): HIV SCREEN 4TH GENERATION: NONREACTIVE

## 2014-09-19 LAB — HAPTOGLOBIN: Haptoglobin: 139 mg/dL (ref 34–200)

## 2014-09-20 LAB — CULTURE, BLOOD (ROUTINE X 2)
CULTURE: NO GROWTH
Culture: NO GROWTH

## 2014-09-20 NOTE — Progress Notes (Signed)
Brief progress note  On admission, due to significant leukocytosis and high protein gap, I suspected that pt might have MM, therefore SPEP and UPEP were ordered by me on admission. I received the reports, which showed that the patient is Bence Jones Protein positive with M-spike. After admission, Dr. Conley Canal took care and discharged pt. I communicated with Dr. Conley Canal about the patient's follow up arrangement on 09/19/14. Dr. Conley Canal wrote me back, saying " I told her to f/u with PCP to recheck PCP. I will contact pcp".  Ivor Costa, MD  [facility] Pager [phone]  If 7PM-7AM, please contact night-coverage [email or web address] Password [facility] 09/20/2014, 6:59 PM

## 2014-10-02 ENCOUNTER — Telehealth: Payer: Self-pay | Admitting: Hematology

## 2014-10-02 NOTE — Telephone Encounter (Signed)
Called pt to schedule new patient appt. Left message.   Dx: Leukocytosis Referring: Dr. Rogers Blocker

## 2014-10-04 ENCOUNTER — Telehealth: Payer: Self-pay | Admitting: Hematology

## 2014-10-04 NOTE — Telephone Encounter (Signed)
Called pt a second time regarding referral.  Left second message

## 2014-10-07 ENCOUNTER — Emergency Department (HOSPITAL_COMMUNITY): Payer: 59

## 2014-10-07 ENCOUNTER — Emergency Department (HOSPITAL_COMMUNITY)
Admission: EM | Admit: 2014-10-07 | Discharge: 2014-10-07 | DRG: 842 | Disposition: A | Discharge status: Other Acute Inpatient Hospital | Payer: 59 | Attending: Internal Medicine | Admitting: Internal Medicine

## 2014-10-07 ENCOUNTER — Encounter (HOSPITAL_COMMUNITY): Payer: Self-pay | Admitting: Emergency Medicine

## 2014-10-07 DIAGNOSIS — J189 Pneumonia, unspecified organism: Secondary | ICD-10-CM | POA: Diagnosis not present

## 2014-10-07 DIAGNOSIS — R1011 Right upper quadrant pain: Secondary | ICD-10-CM | POA: Diagnosis present

## 2014-10-07 DIAGNOSIS — D72829 Elevated white blood cell count, unspecified: Secondary | ICD-10-CM | POA: Diagnosis not present

## 2014-10-07 DIAGNOSIS — Z960 Presence of urogenital implants: Secondary | ICD-10-CM | POA: Insufficient documentation

## 2014-10-07 DIAGNOSIS — R112 Nausea with vomiting, unspecified: Secondary | ICD-10-CM | POA: Diagnosis not present

## 2014-10-07 DIAGNOSIS — K922 Gastrointestinal hemorrhage, unspecified: Secondary | ICD-10-CM | POA: Diagnosis present

## 2014-10-07 DIAGNOSIS — K920 Hematemesis: Secondary | ICD-10-CM | POA: Diagnosis not present

## 2014-10-07 DIAGNOSIS — R109 Unspecified abdominal pain: Secondary | ICD-10-CM | POA: Diagnosis present

## 2014-10-07 DIAGNOSIS — C9200 Acute myeloblastic leukemia, not having achieved remission: Secondary | ICD-10-CM

## 2014-10-07 DIAGNOSIS — R040 Epistaxis: Secondary | ICD-10-CM

## 2014-10-07 DIAGNOSIS — B1910 Unspecified viral hepatitis B without hepatic coma: Secondary | ICD-10-CM | POA: Diagnosis not present

## 2014-10-07 DIAGNOSIS — B181 Chronic viral hepatitis B without delta-agent: Secondary | ICD-10-CM | POA: Insufficient documentation

## 2014-10-07 LAB — CBC WITH DIFFERENTIAL/PLATELET
BAND NEUTROPHILS: 7 % (ref 0–10)
BASOS PCT: 0 % (ref 0–1)
Basophils Absolute: 0 10*3/uL (ref 0.0–0.1)
EOS ABS: 0 10*3/uL (ref 0.0–0.7)
EOS PCT: 0 % (ref 0–5)
HEMATOCRIT: 19.2 % — AB (ref 36.0–46.0)
HEMOGLOBIN: 6 g/dL — AB (ref 12.0–15.0)
Lymphocytes Relative: 8 % — ABNORMAL LOW (ref 12–46)
Lymphs Abs: 6.3 10*3/uL — ABNORMAL HIGH (ref 0.7–4.0)
MCH: 28 pg (ref 26.0–34.0)
MCHC: 31.3 g/dL (ref 30.0–36.0)
MCV: 89.7 fL (ref 78.0–100.0)
Monocytes Absolute: 3.2 10*3/uL — ABNORMAL HIGH (ref 0.1–1.0)
Monocytes Relative: 4 % (ref 3–12)
NEUTROS ABS: 18.1 10*3/uL — AB (ref 1.7–7.7)
Neutrophils Relative %: 16 % — ABNORMAL LOW (ref 43–77)
OTHER: 65 %
Platelets: 194 10*3/uL (ref 150–400)
RBC: 2.14 MIL/uL — ABNORMAL LOW (ref 3.87–5.11)
RDW: 20 % — ABNORMAL HIGH (ref 11.5–15.5)
WBC: 78.9 10*3/uL (ref 4.0–10.5)

## 2014-10-07 LAB — IRON AND TIBC
IRON: 81 ug/dL (ref 28–170)
Saturation Ratios: 32 % — ABNORMAL HIGH (ref 10.4–31.8)
TIBC: 252 ug/dL (ref 250–450)
UIBC: 170 ug/dL

## 2014-10-07 LAB — FERRITIN: Ferritin: 184 ng/mL (ref 11–307)

## 2014-10-07 LAB — URINALYSIS, ROUTINE W REFLEX MICROSCOPIC
Bilirubin Urine: NEGATIVE
GLUCOSE, UA: NEGATIVE mg/dL
Ketones, ur: NEGATIVE mg/dL
Nitrite: NEGATIVE
PROTEIN: 30 mg/dL — AB
SPECIFIC GRAVITY, URINE: 1.009 (ref 1.005–1.030)
Urobilinogen, UA: 0.2 mg/dL (ref 0.0–1.0)
pH: 7 (ref 5.0–8.0)

## 2014-10-07 LAB — COMPREHENSIVE METABOLIC PANEL
ALBUMIN: 2.4 g/dL — AB (ref 3.5–5.0)
ALK PHOS: 76 U/L (ref 38–126)
ALT: 12 U/L — AB (ref 14–54)
AST: 113 U/L — ABNORMAL HIGH (ref 15–41)
BILIRUBIN TOTAL: 0.3 mg/dL (ref 0.3–1.2)
BUN: 12 mg/dL (ref 6–20)
CALCIUM: 8.7 mg/dL — AB (ref 8.9–10.3)
CO2: 23 mmol/L (ref 22–32)
CREATININE: 0.7 mg/dL (ref 0.44–1.00)
Chloride: 110 mmol/L (ref 101–111)
GFR calc Af Amer: >60 mL/min (ref >60)
GFR calc non Af Amer: >60 mL/min (ref >60)
GLUCOSE: 75 mg/dL (ref 65–99)
Potassium: 4.2 mmol/L (ref 3.5–5.1)
SODIUM: 135 mmol/L (ref 135–145)
TOTAL PROTEIN: 9.9 g/dL — AB (ref 6.5–8.1)

## 2014-10-07 LAB — RETICULOCYTES
RBC.: 2.25 MIL/uL — ABNORMAL LOW (ref 3.87–5.11)
Retic Count, Absolute: 65.3 10*3/uL (ref 19.0–186.0)
Retic Ct Pct: 2.9 % (ref 0.4–3.1)

## 2014-10-07 LAB — PROTIME-INR
INR: 1.12 (ref 0.00–1.49)
Prothrombin Time: 14.6 seconds (ref 11.6–15.2)

## 2014-10-07 LAB — ABO/RH: ABO/RH(D): O POS

## 2014-10-07 LAB — PREPARE RBC (CROSSMATCH)

## 2014-10-07 LAB — URINE MICROSCOPIC-ADD ON

## 2014-10-07 LAB — LIPASE, BLOOD: Lipase: 12 U/L — ABNORMAL LOW (ref 22–51)

## 2014-10-07 LAB — VITAMIN B12: VITAMIN B 12: 1192 pg/mL — AB (ref 180–914)

## 2014-10-07 LAB — FOLATE: Folate: 17.1 ng/mL (ref >5.9)

## 2014-10-07 LAB — POC OCCULT BLOOD, ED: FECAL OCCULT BLD: POSITIVE — AB

## 2014-10-07 LAB — POC URINE PREG, ED: Preg Test, Ur: NEGATIVE

## 2014-10-07 MED ORDER — ALLOPURINOL 300 MG PO TABS
300.0000 mg | ORAL_TABLET | Freq: Every day | ORAL | Status: DC
  Filled 2014-10-07: qty 1

## 2014-10-07 MED ORDER — VANCOMYCIN HCL 500 MG IV SOLR
500.0000 mg | Freq: Three times a day (TID) | INTRAVENOUS | Status: DC
  Filled 2014-10-07 (×2): qty 500

## 2014-10-07 MED ORDER — SODIUM CHLORIDE 0.9 % IV SOLN
Freq: Once | INTRAVENOUS | Status: AC
  Administered 2014-10-07: 14:00:00 via INTRAVENOUS

## 2014-10-07 MED ORDER — DIPHENHYDRAMINE HCL 50 MG/ML IJ SOLN
25.0000 mg | Freq: Once | INTRAMUSCULAR | Status: AC
  Administered 2014-10-07: 25 mg via INTRAVENOUS
  Filled 2014-10-07: qty 1

## 2014-10-07 MED ORDER — VANCOMYCIN HCL IN DEXTROSE 1-5 GM/200ML-% IV SOLN
1000.0000 mg | Freq: Once | INTRAVENOUS | Status: AC
Start: 2014-10-07 — End: 2014-10-07
  Administered 2014-10-07: 1000 mg via INTRAVENOUS
  Filled 2014-10-07: qty 200

## 2014-10-07 MED ORDER — PIPERACILLIN-TAZOBACTAM 3.375 G IVPB: 3.3750 g | Freq: Three times a day (TID) | INTRAVENOUS | Status: DC

## 2014-10-07 MED ORDER — ALLOPURINOL 300 MG PO TABS: 300.0000 mg | ORAL_TABLET | Freq: Every day | ORAL | Status: DC

## 2014-10-07 MED ORDER — ONDANSETRON HCL 4 MG/2ML IJ SOLN
4.0000 mg | Freq: Once | INTRAMUSCULAR | Status: AC
  Administered 2014-10-07: 4 mg via INTRAVENOUS
  Filled 2014-10-07: qty 2

## 2014-10-07 MED ORDER — SODIUM CHLORIDE 0.9 % IV BOLUS (SEPSIS)
1000.0000 mL | Freq: Once | INTRAVENOUS | Status: AC
  Administered 2014-10-07: 1000 mL via INTRAVENOUS

## 2014-10-07 MED ORDER — PIPERACILLIN-TAZOBACTAM 3.375 G IVPB 30 MIN
3.3750 g | Freq: Once | INTRAVENOUS | Status: AC
  Administered 2014-10-07: 3.375 g via INTRAVENOUS
  Filled 2014-10-07: qty 50

## 2014-10-07 MED ORDER — HYDROMORPHONE HCL 1 MG/ML IJ SOLN
1.0000 mg | Freq: Once | INTRAMUSCULAR | Status: AC
  Administered 2014-10-07: 1 mg via INTRAVENOUS
  Filled 2014-10-07: qty 1

## 2014-10-07 MED ORDER — SODIUM CHLORIDE 0.9 % IV SOLN: 300.0000 mg | Freq: Every day | INTRAVENOUS | Status: DC

## 2014-10-07 NOTE — ED Notes (Signed)
Main lab called to collect blood samples

## 2014-10-07 NOTE — ED Notes (Signed)
TWO ATTEMPTS MADE UNABLE TO COLLECT

## 2014-10-07 NOTE — ED Notes (Signed)
Irrigation completed in bilateral ears. Pt able to tolerate, she reports being able to hear better

## 2014-10-07 NOTE — ED Notes (Addendum)
Pt c/o itching 15 min after prbcs started.NO hives and or rash present. EDP made aware Bendaryl 25 mg IV prescribed

## 2014-10-07 NOTE — ED Provider Notes (Signed)
CSN: [id number]     Arrival date & time 10/07/14  0753 History   First MD Initiated Contact with Patient 10/07/14 [phone]     Chief Complaint  Patient presents with  . Epistaxis  . Abdominal Pain     (Consider location/radiation/quality/duration/timing/severity/associated sxs/prior Treatment) Patient is a 44 y.o. female presenting with abdominal pain.  Abdominal Pain Pain location:  Suprapubic and RUQ Pain quality: sharp   Pain radiates to:  Does not radiate Pain severity:  Moderate Onset quality:  Gradual Duration:  2 weeks Timing:  Constant Progression:  Worsening (worsening over last 3-4 days) Chronicity:  New Context: not alcohol use, not medication withdrawal, not recent sexual activity and not trauma   Relieved by:  Nothing Worsened by:  Movement, palpation and eating Ineffective treatments:  None tried Associated symptoms: anorexia, chills, cough, nausea and vomiting   Associated symptoms: no constipation, no diarrhea, no dysuria and no fever     Past Medical History  Diagnosis Date  . GERD (gastroesophageal reflux disease)   . Hepatitis   . History of positive PPD     DX 2011--  CXR DONE NO EVIDENCE  . Hydronephrosis, right   . Right ureteral stone   . History of ureter stent   . Chills with fever     intermittently since d/c from hospital  . Neuromuscular disorder     legs numb intermittently  . Dysuria-frequency syndrome     w/ pink urine  . Urosepsis 8/14    admitted to [facility]  . Pneumonia    Past Surgical History  Procedure Laterality Date  . Cystoscopy w/ ureteral stent placement Right 09/25/2012    Procedure: CYSTOSCOPY WITH RETROGRADE PYELOGRAM/URETERAL STENT PLACEMENT;  Surgeon: Alexis Frock, MD;  Location: [facility];  Service: Urology;  Laterality: Right;  . Liver biopsy    . Right vats w/ drainage peural effusion and bx's  10-30-2008  . Removal of uterine cyst      years ago  . Other surgical history Right     removal of ovarian cyst  . Cystoscopy  with retrograde pyelogram, ureteroscopy and stent placement Right 10/15/2012    Procedure: CYSTOSCOPY WITH RETROGRADE PYELOGRAM, URETEROSCOPY AND REMOVAL STENT WITH  STENT PLACEMENT;  Surgeon: Alexis Frock, MD;  Location: [facility];  Service: Urology;  Laterality: Right;  . Holmium laser application Right 6/33/3545    Procedure: HOLMIUM LASER APPLICATION;  Surgeon: Alexis Frock, MD;  Location: [facility];  Service: Urology;  Laterality: Right;   Family History  Problem Relation Age of Onset  . Stomach cancer Mother   . Lung disease Father    Social History  Substance Use Topics  . Smoking status: Never Smoker   . Smokeless tobacco: None  . Alcohol Use: No   OB History    No data available     Review of Systems  Constitutional: Positive for chills. Negative for fever.  Respiratory: Positive for cough.   Gastrointestinal: Positive for nausea, vomiting, abdominal pain and anorexia. Negative for diarrhea and constipation.  Genitourinary: Negative for dysuria.  All other systems reviewed and are negative.     Allergies  Review of patient's allergies indicates no known allergies.  Home Medications   Prior to Admission medications   Medication Sig Start Date End Date Taking? Authorizing Provider  polyethylene glycol (MIRALAX / GLYCOLAX) packet Take 17 g by mouth daily. 09/17/14  Yes Delfina Redwood, MD   BP 105/65 mmHg  Pulse 75  Temp(Src)  98.5 F (36.9 C) (Oral)  Resp 16  SpO2 99%  LMP 10/07/2014 Physical Exam  Constitutional: She is oriented to person, place, and time. She appears well-developed and well-nourished.  HENT:  Head: Normocephalic and atraumatic.  Right Ear: External ear normal.  Left Ear: External ear normal.  Eyes: Conjunctivae and EOM are normal. Pupils are equal, round, and reactive to light.  Neck: Normal range of motion. Neck supple.  Cardiovascular: Normal rate, regular rhythm, normal heart sounds and intact  distal pulses.   Pulmonary/Chest: Effort normal and breath sounds normal.  Abdominal: Soft. Bowel sounds are normal. There is tenderness in the right upper quadrant and epigastric area.  Musculoskeletal: Normal range of motion.  Neurological: She is alert and oriented to person, place, and time.  Skin: Skin is warm and dry.  Vitals reviewed.   ED Course  Procedures (including critical care time) Labs Review Labs Reviewed  COMPREHENSIVE METABOLIC PANEL - Abnormal; Notable for the following:    Calcium 8.7 (*)    Total Protein 9.9 (*)    Albumin 2.4 (*)    AST 113 (*)    ALT 12 (*)    All other components within normal limits  LIPASE, BLOOD - Abnormal; Notable for the following:    Lipase 12 (*)    All other components within normal limits  CBC WITH DIFFERENTIAL/PLATELET - Abnormal; Notable for the following:    WBC 78.9 (*)    RBC 2.14 (*)    Hemoglobin 6.0 (*)    HCT 19.2 (*)    RDW 20.0 (*)    Neutrophils Relative % 16 (*)    Lymphocytes Relative 8 (*)    Neutro Abs 18.1 (*)    Lymphs Abs 6.3 (*)    Monocytes Absolute 3.2 (*)    All other components within normal limits  URINALYSIS, ROUTINE W REFLEX MICROSCOPIC (NOT AT [facility]) - Abnormal; Notable for the following:    APPearance CLOUDY (*)    Hgb urine dipstick LARGE (*)    Protein, ur 30 (*)    Leukocytes, UA MODERATE (*)    All other components within normal limits  URINE MICROSCOPIC-ADD ON - Abnormal; Notable for the following:    Squamous Epithelial / LPF FEW (*)    Bacteria, UA MANY (*)    All other components within normal limits  VITAMIN B12 - Abnormal; Notable for the following:    Vitamin B-12 1192 (*)    All other components within normal limits  IRON AND TIBC - Abnormal; Notable for the following:    Saturation Ratios 32 (*)    All other components within normal limits  RETICULOCYTES - Abnormal; Notable for the following:    RBC. 2.25 (*)    All other components within normal limits  POC OCCULT BLOOD,  ED - Abnormal; Notable for the following:    Fecal Occult Bld POSITIVE (*)    All other components within normal limits  CULTURE, BLOOD (ROUTINE X 2)  CULTURE, BLOOD (ROUTINE X 2)  PROTIME-INR  FOLATE  FERRITIN  PATHOLOGIST SMEAR REVIEW  POC URINE PREG, ED  TYPE AND SCREEN  PREPARE RBC (CROSSMATCH)  ABO/RH    Imaging Review Dg Chest 2 View  10/07/2014   CLINICAL DATA:  44 year old female with cough, headache, nausea, vomiting and weakness for the past 2 weeks.  EXAM: CHEST  2 VIEW  COMPARISON:  Chest x-ray 01/2015.  FINDINGS: There are some ill-defined opacities in lung bases bilaterally, which could represent areas of  ill-defined peribronchovascular airspace consolidation as can be seen in the setting of mild aspiration or bronchopneumonia. Lungs otherwise appear clear. No pleural effusions. No evidence of pulmonary edema. No pneumothorax. Heart size is normal. Upper mediastinal contours are within normal limits.  IMPRESSION: Ill-defined bibasilar opacities which may reflect sequela of mild aspiration or developing areas of bronchopneumonia.   Electronically Signed   By: Vinnie Langton M.D.   On: 10/07/2014 10:52   US Abdomen Complete  10/07/2014   CLINICAL DATA:  Right upper quadrant pain for 2 weeks  EXAM: ULTRASOUND ABDOMEN COMPLETE  COMPARISON:  CT abdomen 08/27/2010, ultrasound abdomen 09/14/2014  FINDINGS: Gallbladder: No gallbladder sludge. No gallstones or wall thickening visualized. No sonographic Murphy sign noted.  Common bile duct: Diameter: 3 mm  Liver: 1 cm anechoic right hepatic mass with increased through transmission most consistent with a small cyst. Within normal limits in parenchymal echogenicity.  IVC: No abnormality visualized.  Pancreas: Visualized portion unremarkable.  Spleen: Mild splenic enlargement measuring 12.9 x 13.4 x 9.4 cm with a volume of 143 mL. There is a 9.2 x 4.9 x 4.4 cm complex subcapsular fluid collection with multiple thin internal septations similar in  appearance to the prior examination of 09/25/2012 likely reflecting sequela prior trauma versus lymphangioma, but the appearance is unchanged compared with 08/27/2010.  Right Kidney: Length: 11.2 cm. Echogenicity within normal limits. No hydronephrosis. 8 mm hypoechoic right renal mass in the upper pole likely representing a small cyst when correlated with prior CT dated 08/27/2010. Hyperechoic focus in the mid pole of the right kidney likely representing a nonobstructing calculus.  Left Kidney: Length: 10.7 cm. Echogenicity within normal limits. No hydronephrosis. 1.7 x 1.9 x 1.7 cm hypoechoic left renal mass most consistent with a cyst. Echogenic area in the lower pole of the right kidney measuring 2.3 cm likely reflecting calcification.  Abdominal aorta: No aneurysm visualized.  Other findings: None.  IMPRESSION: 1. No cholelithiasis or sonographic evidence of acute cholecystitis. 2. Mild splenomegaly. Stable perisplenic complex fluid collection unchanged compared with 08/27/2010. This may reflect sequela prior insult versus a lymphangioma. 3. Bilateral nephrolithiasis.  No obstructive uropathy.   Electronically Signed   By: Kathreen Devoid   On: 10/07/2014 10:17   I have personally reviewed and evaluated these images and lab results as part of my medical decision-making.   EKG Interpretation   Date/Time:  Saturday October 07 2014 08:40:39 EDT Ventricular Rate:  80 PR Interval:  142 QRS Duration: 91 QT Interval:  390 QTC Calculation: 450 R Axis:   82 Text Interpretation:  Sinus rhythm RSR' in V1 or V2, right VCD or RVH No  significant change since last tracing Confirmed by Debby Freiberg [phone])  on 10/07/2014 9:04:41 AM      MDM   Final diagnoses:  None    44 y.o. female with pertinent PMH of recent admission for abd pain, GERD presents with continued abd pain, epistaxis, and reported hematemesis since dc, however acutely worsening over the last 3-4 days.  Recent admission revealed M  spike, with suggested heme fu which has not been obtained.  No fevers.  Exam as above.  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  Oncology requested transfer to [facility], which was arranged.  Empirically covered for PNA due to CXR with ? PNA, vanc/zosyn given.  Blood cultures drawn.  Allopurinol ordered at request of admitting oncologist.  Transferred in stable condition    I have reviewed all laboratory and imaging studies if ordered as above  No diagnosis found.      Debby Freiberg, MD 10/08/14 [phone]

## 2014-10-07 NOTE — ED Notes (Signed)
Consent prbcs and pathology smear placed at bedside for transport to [facility]

## 2014-10-07 NOTE — ED Notes (Signed)
MD at bedside. 

## 2014-10-07 NOTE — ED Notes (Addendum)
Lab reported several abnormal values on differential, Technician will be ordering a pathology review  WBC: 78.9 HGB: 6.0  Results called to EDP provider

## 2014-10-07 NOTE — ED Notes (Signed)
RN unable to draw labs, ultrasound at bedside. Will collect labs after.

## 2014-10-07 NOTE — ED Notes (Signed)
Per pt/family, states she has been vomiting blood, having nose bleeds, and epigastric pain for 3 days-was hospitalized last week for same symptoms

## 2014-10-07 NOTE — ED Notes (Signed)
Patient is still at bedside

## 2014-10-07 NOTE — Progress Notes (Signed)
ANTIBIOTIC CONSULT NOTE - INITIAL  Pharmacy Consult for Vancomycin, Zosyn Indication: pneumonia  No Known Allergies  Patient Measurements:   Adjusted Body Weight:   Vital Signs: Temp: 98.3 F (36.8 C) (09/03 0800) Temp Source: Oral (09/03 0800) BP: 103/67 mmHg (09/03 1000) Pulse Rate: 80 (09/03 1000) Intake/Output from previous day:   Intake/Output from this shift:    Labs:  Recent Labs  10/07/14 1054  WBC 78.9*  HGB 6.0*  PLT 194  CREATININE 0.70   CrCl cannot be calculated (Unknown ideal weight.). No results for input(s): VANCOTROUGH, VANCOPEAK, VANCORANDOM, GENTTROUGH, GENTPEAK, GENTRANDOM, TOBRATROUGH, TOBRAPEAK, TOBRARND, AMIKACINPEAK, AMIKACINTROU, AMIKACIN in the last 72 hours.   Microbiology: Recent Results (from the past 720 hour(s))  Culture, blood (routine x 2)     Status: None   Collection Time: 09/14/14 11:45 PM  Result Value Ref Range Status   Specimen Description BLOOD RIGHT FOREARM  Final   Special Requests BOTTLES DRAWN AEROBIC AND ANAEROBIC 5CC   Final   Culture NO GROWTH 5 DAYS  Final   Report Status 09/20/2014 FINAL  Final  Culture, blood (routine x 2)     Status: None   Collection Time: 09/14/14 11:45 PM  Result Value Ref Range Status   Specimen Description BLOOD RIGHT HAND  Final   Special Requests BOTTLES DRAWN AEROBIC AND ANAEROBIC 5CC   Final   Culture NO GROWTH 5 DAYS  Final   Report Status 09/20/2014 FINAL  Final  Urine culture     Status: None   Collection Time: 09/15/14  4:21 AM  Result Value Ref Range Status   Specimen Description URINE, CLEAN CATCH  Final   Special Requests NONE  Final   Culture MULTIPLE SPECIES PRESENT, SUGGEST RECOLLECTION  Final   Report Status 09/16/2014 FINAL  Final  MRSA PCR Screening     Status: None   Collection Time: 09/15/14  5:02 AM  Result Value Ref Range Status   MRSA by PCR NEGATIVE NEGATIVE Final    Comment:        The GeneXpert MRSA Assay (FDA approved for NASAL specimens only), is one  component of a comprehensive MRSA colonization surveillance program. It is not intended to diagnose MRSA infection nor to guide or monitor treatment for MRSA infections.     Medical History: Past Medical History  Diagnosis Date  . GERD (gastroesophageal reflux disease)   . Hepatitis   . History of positive PPD     DX 2011--  CXR DONE NO EVIDENCE  . Hydronephrosis, right   . Right ureteral stone   . History of ureter stent   . Chills with fever     intermittently since d/c from hospital  . Neuromuscular disorder     legs numb intermittently  . Dysuria-frequency syndrome     w/ pink urine  . Urosepsis 8/14    admitted to [facility]  . Pneumonia      Assessment: 44 y.o. female with PMH of GERD, chronic bilateral leg numbness, right hydronephrosis, s/p of ureteral stent placement, hepatitis B, recently admitted for abdominal pains (8/11-8/14) presented with recurrent right upper quadrant abdominal pains, associated with nausea, vomiting and hematemesis. Patient also reports progressive dyspnea, tiredness, cough, productive associated with chills.  Pharmacy consulted to start Vancomycin and Zosyn for pneumonia, HCAP vs possible aspiration due to vomiting at home.  9/3 >> Vancomycin >> 9/3 >> Zosyn >>  Tmax: afebrile WBC: 78K, MD suspecting multiple myeloma Renal: SCr 0.70, CrCl~82 ml/min (CG) 9/3 blood x 2:  collected  Goal of Therapy:  Vancomycin trough level 15-20 mcg/ml  Doses adjusted per renal function Eradication of infection  Plan:  1.  Vancomycin 1g x 1 then 500 mg IV q8h. 2.  Zosyn 3.375g IV q8h (4 hour infusion time).  3.  F/u culture results, vanc trough as indicated, clinical course.  Hershal Coria 10/07/2014,1:33 PM

## 2014-10-07 NOTE — H&P (Signed)
[facility] History and Physical  Sheryl [name] OQH:[id number] DOB: [birth year] DOA: 10/07/2014  Referring physician:  PCP: No primary care provider on file.  Specialists:   Chief Complaint: nausea, vomiting, abdominal pain   HPI: Sheryl [name] is a 43 y.o. female with PMH of GERD, chronic bilateral leg numbness, right hydronephrosis, s/p of ureteral stent placement, hepatitis B, recently admitted for abdominal pains (8/11-8/14) presented with recurrent right upper quadrant abdominal pains, associated with nausea, vomiting and hematemesis. Patient also reports progressive dyspnea, tiredness, cough, productive associated with chills. She denies any new focal neuro symptoms, no active chest pains.  -Ed: WBC- 78K. Hg-6.0. hospitalist is called for evaluation   Review of Systems: The patient denies anorexia, fever, weight loss,, vision loss, decreased hearing, hoarseness, chest pain, syncope, dyspnea on exertion, peripheral edema, balance deficits, hemoptysis, hematuria, incontinence, genital sores, muscle weakness, suspicious skin lesions, transient blindness, difficulty walking, depression, unusual weight change, abnormal bleeding, enlarged lymph nodes, angioedema, and breast masses.    Past Medical History  Diagnosis Date  . GERD (gastroesophageal reflux disease)   . Hepatitis   . History of positive PPD     DX 2011--  CXR DONE NO EVIDENCE  . Hydronephrosis, right   . Right ureteral stone   . History of ureter stent   . Chills with fever     intermittently since d/c from hospital  . Neuromuscular disorder     legs numb intermittently  . Dysuria-frequency syndrome     w/ pink urine  . Urosepsis 8/14    admitted to [facility]  . Pneumonia    Past Surgical History  Procedure Laterality Date  . Cystoscopy w/ ureteral stent placement Right 09/25/2012    Procedure: CYSTOSCOPY WITH RETROGRADE PYELOGRAM/URETERAL STENT PLACEMENT;  Surgeon: Alexis Frock, MD;  Location: [facility];  Service: Urology;   Laterality: Right;  . Liver biopsy    . Right vats w/ drainage peural effusion and bx's  10-30-2008  . Removal of uterine cyst      years ago  . Other surgical history Right     removal of ovarian cyst  . Cystoscopy with retrograde pyelogram, ureteroscopy and stent placement Right 10/15/2012    Procedure: CYSTOSCOPY WITH RETROGRADE PYELOGRAM, URETEROSCOPY AND REMOVAL STENT WITH  STENT PLACEMENT;  Surgeon: Alexis Frock, MD;  Location[facility];  Service: Urology;  Laterality: Right;  . Holmium laser application Right 5/46/5681    Procedure: HOLMIUM LASER APPLICATION;  Surgeon: Alexis Frock, MD;  Location: [facility];  Service: Urology;  Laterality: Right;   Social History:  reports that she has never smoked. She does not have any smokeless tobacco history on file. She reports that she does not drink alcohol. Her drug history is not on file. Home;  where does patient live--home, ALF, SNF? and with whom if at home? Yes;  Can patient participate in ADLs?  No Known Allergies  Family History  Problem Relation Age of Onset  . Stomach cancer Mother   . Lung disease Father     (be sure to complete)  Prior to Admission medications   Medication Sig Start Date End Date Taking? Authorizing Provider  polyethylene glycol (MIRALAX / GLYCOLAX) packet Take 17 g by mouth daily. 09/17/14  Yes Delfina Redwood, MD   Physical Exam: Filed Vitals:   10/07/14 1000  BP: 103/67  Pulse: 80  Temp:   Resp: 16     General:  Alert. No distress   Eyes: eom-i  ENT: no  oral ulcers   Neck: supple, no JVD  Cardiovascular: s1,s2 rrr  Respiratory: few rales inLL  Abdomen: soft, RUQ tender. No rebound,.   Skin: no rash   Musculoskeletal: no leg edema  Psychiatric: no hallucinations   Neurologic: cn 2-12 intact. Motor 5/5 BL  Labs on Admission:  Basic Metabolic Panel:  Recent Labs Lab 10/07/14 1054  NA 135  K 4.2  CL 110  CO2 23  GLUCOSE 75  BUN 12   CREATININE 0.70  CALCIUM 8.7*   Liver Function Tests:  Recent Labs Lab 10/07/14 1054  AST 113*  ALT 12*  ALKPHOS 76  BILITOT 0.3  PROT 9.9*  ALBUMIN 2.4*    Recent Labs Lab 10/07/14 1054  LIPASE 12*   No results for input(s): AMMONIA in the last 168 hours. CBC:  Recent Labs Lab 10/07/14 1054  WBC 78.9*  NEUTROABS 18.1*  HGB 6.0*  HCT 19.2*  MCV 89.7  PLT 194   Cardiac Enzymes: No results for input(s): CKTOTAL, CKMB, CKMBINDEX, TROPONINI in the last 168 hours.  BNP (last 3 results) No results for input(s): BNP in the last 8760 hours.  ProBNP (last 3 results) No results for input(s): PROBNP in the last 8760 hours.  CBG: No results for input(s): GLUCAP in the last 168 hours.  Radiological Exams on Admission: Dg Chest 2 View  10/07/2014   CLINICAL DATA:  44 year old female with cough, headache, nausea, vomiting and weakness for the past 2 weeks.  EXAM: CHEST  2 VIEW  COMPARISON:  Chest x-ray 01/2015.  FINDINGS: There are some ill-defined opacities in lung bases bilaterally, which could represent areas of ill-defined peribronchovascular airspace consolidation as can be seen in the setting of mild aspiration or bronchopneumonia. Lungs otherwise appear clear. No pleural effusions. No evidence of pulmonary edema. No pneumothorax. Heart size is normal. Upper mediastinal contours are within normal limits.  IMPRESSION: Ill-defined bibasilar opacities which may reflect sequela of mild aspiration or developing areas of bronchopneumonia.   Electronically Signed   By: Vinnie Langton M.D.   On: 10/07/2014 10:52   US Abdomen Complete  10/07/2014   CLINICAL DATA:  Right upper quadrant pain for 2 weeks  EXAM: ULTRASOUND ABDOMEN COMPLETE  COMPARISON:  CT abdomen 08/27/2010, ultrasound abdomen 09/14/2014  FINDINGS: Gallbladder: No gallbladder sludge. No gallstones or wall thickening visualized. No sonographic Murphy sign noted.  Common bile duct: Diameter: 3 mm  Liver: 1 cm anechoic  right hepatic mass with increased through transmission most consistent with a small cyst. Within normal limits in parenchymal echogenicity.  IVC: No abnormality visualized.  Pancreas: Visualized portion unremarkable.  Spleen: Mild splenic enlargement measuring 12.9 x 13.4 x 9.4 cm with a volume of 143 mL. There is a 9.2 x 4.9 x 4.4 cm complex subcapsular fluid collection with multiple thin internal septations similar in appearance to the prior examination of 09/25/2012 likely reflecting sequela prior trauma versus lymphangioma, but the appearance is unchanged compared with 08/27/2010.  Right Kidney: Length: 11.2 cm. Echogenicity within normal limits. No hydronephrosis. 8 mm hypoechoic right renal mass in the upper pole likely representing a small cyst when correlated with prior CT dated 08/27/2010. Hyperechoic focus in the mid pole of the right kidney likely representing a nonobstructing calculus.  Left Kidney: Length: 10.7 cm. Echogenicity within normal limits. No hydronephrosis. 1.7 x 1.9 x 1.7 cm hypoechoic left renal mass most consistent with a cyst. Echogenic area in the lower pole of the right kidney measuring 2.3 cm likely reflecting calcification.  Abdominal aorta: No aneurysm visualized.  Other findings: None.  IMPRESSION: 1. No cholelithiasis or sonographic evidence of acute cholecystitis. 2. Mild splenomegaly. Stable perisplenic complex fluid collection unchanged compared with 08/27/2010. This may reflect sequela prior insult versus a lymphangioma. 3. Bilateral nephrolithiasis.  No obstructive uropathy.   Electronically Signed   By: Kathreen Devoid   On: 10/07/2014 10:17    EKG: Independently reviewed.   Assessment/Plan Active Problems:   Leukocytosis   Abdominal pain   GIB (gastrointestinal bleeding)   Healthcare-associated pneumonia   44 y.o. female with PMH of GERD, chronic bilateral leg numbness, right hydronephrosis, s/p of ureteral stent placement, hepatitis B, recently admitted for  abdominal pains (8/11-8/14) presented with recurrent right upper quadrant abdominal pains, associated with nausea, vomiting and hematemesis. -admitted with leukocytosis, probable aspiration pneumonia, blood loss anemia, GIB  1. HCAP. Possible aspiration pneumonia due to recurrent vomiting at home. CXR: Ill-defined bibasilar opacities which may reflect sequela of mild aspiration or developing areas of bronchopneumonia. [id number]. But clinically looks stable  -start empiric IV atx, obtain cultures. Prn bronchodilators, aspiration precautions  2. 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 Positive M spike. Protein gap.+lt chains.  Anemia, back pains.  -? Underlying CML. Consulted hematology eval. May need BM, cytogenetics  3. GIB. hemodynamically stable. [place] abd: No cholelithiasis or sonographic evidence of acute cholecystitis. Mild splenomegaly.  -? Related to progressive hep B. consulted GI evaluation, endoscopy eval. Start PPI. TF as needed  4. Acute blood loss anemia. On top of chronic anemia. She is symptomatic. D/w patient, she preferred to have blood transfusion. We will TF 2 units. Monitor. Hematology eval     GI, hematology;  if consultant consulted, please document name and whether formally or informally consulted  Code Status: full (must indicate code status--if unknown or must be presumed, indicate so) Family Communication:  D/w patient, her sister, daughter  (indicate person spoken with, if applicable, with phone number if by telephone) Disposition Plan: pend clinical imrpovement  (indicate anticipated LOS)  Time spent: >45 minutes   Kinnie Feil [facility] Pager [phone]  If 7PM-7AM, please contact night-coverage [email or web address] Password [email or web address] 10/07/2014, 12:58 PM

## 2014-10-07 NOTE — ED Notes (Signed)
Lab contacted regarding PRBC's. Lab reports being uncertain regarding T&C. Lab made aware units needed for transfusion asap d/t low HGB 6.0

## 2014-10-07 NOTE — Consult Note (Signed)
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 Epistaxis  . Abdominal Pain  : Patient cannot give any history herself. She does not spake [place].  HPI: Ms. Predmore is a very nice 44 year old. Anemia is female. She's been in the [place] for 7 years.  She recently was in the hospital area and she ate, that time, was found to have a elevated white cell count. She also was found to have a monoclonal spike of 4.2 g/dL.  Further evaluation showed that she had an IgG lambda spike. A random urine showed 2750 mg/L of lambda light chain.  She is discharged and was supposed to have some outpatient follow-up with hematology.  She subsequently was seen in the emergency room. She had a white cell count of 78,000. Hemoglobin was only 6. Her platelet count was 194,000. Her total protein was 9.9 with an albumin of 2.4. BUN was 12 creatinine was 0.7. A chest x-ray was done. She had a having a cough. His heart assay if she had any type of fever. The chest x-ray showed some patchy by basilar opacities.  She had a healing wound above the right eye along the lateral part of the eyebrow. There is some erythema associated with this.  She also has a diagnosis of hepatitis B. Given that she is from [place], is no surprise that she has this.  She did have an ultrasound done today. She had mild splenic enlargement. There is noted be a complex set of capsular fluid collection. There was a 1 cm right hepatic lesion. There is no obvious lymphadenopathy. She had bilateral kidney stones.  We were asked to see her to try to help with management.    Past Medical History  Diagnosis Date  . GERD (gastroesophageal reflux disease)   . Hepatitis   . History of positive PPD     DX 2011--  CXR DONE NO EVIDENCE  . Hydronephrosis, right   . Right ureteral stone   . History of ureter stent   . Chills with fever     intermittently since d/c from  hospital  . Neuromuscular disorder     legs numb intermittently  . Dysuria-frequency syndrome     w/ pink urine  . Urosepsis 8/14    admitted to [facility]  . Pneumonia   :  Past Surgical History  Procedure Laterality Date  . Cystoscopy w/ ureteral stent placement Right 09/25/2012    Procedure: CYSTOSCOPY WITH RETROGRADE PYELOGRAM/URETERAL STENT PLACEMENT;  Surgeon: Alexis Frock, MD;  Location: [facility];  Service: Urology;  Laterality: Right;  . Liver biopsy    . Right vats w/ drainage peural effusion and bx's  10-30-2008  . Removal of uterine cyst      years ago  . Other surgical history Right     removal of ovarian cyst  . Cystoscopy with retrograde pyelogram, ureteroscopy and stent placement Right 10/15/2012    Procedure: CYSTOSCOPY WITH RETROGRADE PYELOGRAM, URETEROSCOPY AND REMOVAL STENT WITH  STENT PLACEMENT;  Surgeon: Alexis Frock, MD;  Location: [facility];  Service: Urology;  Laterality: Right;  . Holmium laser application Right 9/51/8841    Procedure: HOLMIUM LASER APPLICATION;  Surgeon: Alexis Frock, MD;  Location: [facility];  Service: Urology;  Laterality: Right;  :   Current facility-administered medications:  .  piperacillin-tazobactam (ZOSYN) IVPB 3.375 g, 3.375 g, Intravenous, Once, Dara Hoyer, RPH, Last Rate: 100 mL/hr at  10/07/14 1413, 3.375 g at 10/07/14 1413 .  piperacillin-tazobactam (ZOSYN) IVPB 3.375 g, 3.375 g, Intravenous, Q8H, Dara Hoyer, RPH .  vancomycin (VANCOCIN) 500 mg in sodium chloride 0.9 % 100 mL IVPB, 500 mg, Intravenous, Q8H, Dara Hoyer, RPH .  vancomycin (VANCOCIN) IVPB 1000 mg/200 mL premix, 1,000 mg, Intravenous, Once, Dara Hoyer, RPH, Last Rate: 200 mL/hr at 10/07/14 1413, 1,000 mg at 10/07/14 1413  Current outpatient prescriptions:  .  polyethylene glycol (MIRALAX / GLYCOLAX) packet, Take 17 g by mouth daily., Disp: 14 each, Rfl: 0:   :  No Known Allergies:  Family History  Problem  Relation Age of Onset  . Stomach cancer Mother   . Lung disease Father   :  Social History   Social History  . Marital Status: Single    Spouse Name: N/A  . Number of Children: N/A  . Years of Education: N/A   Occupational History  . Not on file.   Social History Main Topics  . Smoking status: Never Smoker   . Smokeless tobacco: Not on file  . Alcohol Use: No  . Drug Use: Not on file  . Sexual Activity: Not Currently    Birth Control/ Protection: Abstinence   Other Topics Concern  . Not on file   Social History Narrative  :  Pertinent items are noted in HPI.  Exam: Patient Vitals for the past 24 hrs:  BP Temp Temp src Pulse Resp SpO2  10/07/14 1330 110/59 mmHg - - 78 18 99 %  10/07/14 1230 104/69 mmHg - - 71 20 97 %  10/07/14 1000 103/67 mmHg - - 80 16 96 %  10/07/14 0915 - - - 82 15 96 %  10/07/14 0800 124/83 mmHg 98.3 F (36.8 C) Oral 95 16 100 %   Ill appearing. [place] female. Head and neck exam shows a healing wound along the right supraorbital ridge. She has dry oral mucosa. No adenopathy noted in the neck. Lungs are clear. She has some scattered wheezes. Cardiac exam tachycardic regular. Abdomen is soft. Bowel sounds are slightly decreased. Spleen might be palpable with deep inspiration. Liver edge is palpable with deep inspiration. Extremities shows no clubbing, cyanosis or edema. No joint swelling is noted. Skin exam shows no rashes, ecchymosis or petechia. Neurological exam is nonfocal.    Recent Labs  10/07/14 1054  WBC 78.9*  HGB 6.0*  HCT 19.2*  PLT 194    Recent Labs  10/07/14 1054  NA 135  K 4.2  CL 110  CO2 23  GLUCOSE 75  BUN 12  CREATININE 0.70  CALCIUM 8.7*    Blood smear review  Moderate anisocytosis and poikilocytosis. She has nucleated red blood cells. She has a marked increase in white blood cells. A lot of these appear to be dysplastic-appearing plasma cells. She has large plasma cells with  nucleoli. These plasma cells  appear to be full of protein. She has some normal-appearing myeloid cells. I do not see hypersegmented polys. Platelets appear to be adequate in number and size. Pathology: None     Assessment and Plan:  Ms.  Paugh is a 44 year old female [place] female. 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 I believe that her blood smear is highly consistent with this. Rarely ever seen a blood smear with such dysplastic looking plasma cells.  The amount of lambda light chain in her random urine was incredibly impressive.  Given her young age, I suspect that she will need  some type of induction regimen for this. I suspect the she also will need a stem cell transplant.  She needs to be transferred to an academic [facility]. I spoke to her ER attending. I told him what I thought. He agreed with the transfer. He will make the arrangements.  I spoke to her daughter and sister. They speak English. They understand very well what is going on and the severity of the problem that we have. I explained to them that we just would not be doing her any benefit by keeping her in [place]. She clearly needs specialized care and highly aggressive therapy. I believe this can only be done in an academic setting. As such, I think that [facility] would be perfect for her.  This is a very rare disease that we see. We may see a plastic cell leukemia every 2 or 3 years.  I spent about an hour with the patient and her family. Again, explained to them what I thought was going on and why I felt that she needed to be transferred.  I appreciate the opportunity to have seen her. She seems very very nice. Unfortunately, she has a very bad problem that needs highly specialized care.  Frederich Cha 1:5-7

## 2014-10-07 NOTE — ED Notes (Signed)
transport arranged via [facility]

## 2014-10-08 LAB — TYPE AND SCREEN
ABO/RH(D): O POS
Antibody Screen: NEGATIVE
Unit division: 0
Unit division: 0

## 2014-10-10 LAB — PATHOLOGIST SMEAR REVIEW

## 2014-10-12 LAB — CULTURE, BLOOD (ROUTINE X 2): CULTURE: NO GROWTH

## 2014-10-18 ENCOUNTER — Telehealth: Payer: Self-pay | Admitting: Hematology

## 2014-10-18 NOTE — Telephone Encounter (Signed)
S/w Becky @ [facility] and gave appt for 09/20 @ 10:45 w/Dr. Burr Medico

## 2014-10-23 NOTE — Progress Notes (Signed)
Sheryl [name]  Telephone:([phone]) [phone] Fax:(336) [phone]  Clinic New Consult Note   No care team member to display 10/24/2014   REFERRAL PHYSICIAN: Dr. Gilda Crease at [facility]:  Acute plasma cell leukemia   HISTORY OF PRESENTING ILLNESS:  Sheryl [name] 44 y.o. female is here because of recently diagnosed plasma cell leukemia. She was recently discharged form [facility] 3 days ago and is here to establish her local oncological care with [place]. She is a [place], does not speak [place], she is accompanied to the clinic by her daughter and interpreter.  She presented to our hospital lth with worsening dyspnea, fatigue, cough, subjective fevers and chills, and was admitted on 09/14/2014. She was found to have allergy WBC 54.1K, hemoglobin 8.1, plt 310, she was treated with symptom management, and was discharged home on 09/17/2014, with a plan to follow-up with hematology. She represents to emergency room on 10/07/2014, was found to have white count of 78K, and worsening anemia with hemoglobin 6. She was seen by my partner Dr. Jonette Eva and plasma cell leukemia was suspected. She was transferred to [facility] for further leukemia work-up and treatment. Bone marrow biopsy was done, which confirmed plasma cell leukemia, and she started chemotherapy with Velcade, Cytoxan and dexamethasone. She received weekly dose twice, last dose on 10/20/2014. She was discharged to home afterwards. She had a mediport placed during her hospitalization.  She has moderate fatigue, low appetie, she lost about 13 lbs in the past few months. She has moderate abdominal pain, 5-6/10, persistent, she does not take any pain meds.   I reviewed her medical records extensively, and discussed her case with Dr. Jorje Guild and [place] program coordinator.  MEDICAL HISTORY:  Past Medical History  Diagnosis Date  . GERD (gastroesophageal reflux disease)   . Hepatitis    . History of positive PPD     DX 2011--  CXR DONE NO EVIDENCE  . Hydronephrosis, right   . Right ureteral stone   . History of ureter stent   . Chills with fever     intermittently since d/c from hospital  . Neuromuscular disorder     legs numb intermittently  . Dysuria-frequency syndrome     w/ pink urine  . Urosepsis 8/14    admitted to [facility]  . Pneumonia     SURGICAL HISTORY: Past Surgical History  Procedure Laterality Date  . Cystoscopy w/ ureteral stent placement Right 09/25/2012    Procedure: CYSTOSCOPY WITH RETROGRADE PYELOGRAM/URETERAL STENT PLACEMENT;  Surgeon: Alexis Frock, MD;  Location: [facility];  Service: Urology;  Laterality: Right;  . Liver biopsy    . Right vats w/ drainage peural effusion and bx's  10-30-2008  . Removal of uterine cyst      years ago  . Other surgical history Right     removal of ovarian cyst  . Cystoscopy with retrograde pyelogram, ureteroscopy and stent placement Right 10/15/2012    Procedure: CYSTOSCOPY WITH RETROGRADE PYELOGRAM, URETEROSCOPY AND REMOVAL STENT WITH  STENT PLACEMENT;  Surgeon: Alexis Frock, MD;  Location: [facility];  Service: Urology;  Laterality: Right;  . Holmium laser application Right 07/27/7626    Procedure: HOLMIUM LASER APPLICATION;  Surgeon: Alexis Frock, MD;  Location: [facility];  Service: Urology;  Laterality: Right;    SOCIAL HISTORY: Social History   Social History  . Marital Status: Single    Spouse Name: N/A  . Number of Children: 15   .  Years of Education: N/A   Occupational History  . Not on file.   Social History Main Topics  . Smoking status: Never Smoker   . Smokeless tobacco: Not on file  . Alcohol Use: No  . Drug Use: No  . Sexual Activity: Not Currently    Birth Control/ Protection: Abstinence   Other Topics Concern  . Not on file   Social History Narrative    FAMILY HISTORY: Family History  Problem Relation Age of Onset  . Stomach cancer Mother    . Lung disease Father     ALLERGIES:  has No Known Allergies.  MEDICATIONS:  Current Outpatient Prescriptions  Medication Sig Dispense Refill  . allopurinol (ZYLOPRIM) 300 MG tablet Take 300 mg by mouth.    . entecavir (BARACLUDE) 0.5 MG tablet Take 0.5 mg by mouth.     No current facility-administered medications for this visit.    REVIEW OF SYSTEMS:   Constitutional: Denies fevers, chills or abnormal night sweats Eyes: Denies blurriness of vision, double vision or watery eyes Ears, nose, mouth, throat, and face: Denies mucositis or sore throat Respiratory: Denies cough, dyspnea or wheezes Cardiovascular: Denies palpitation, chest discomfort or lower extremity swelling Gastrointestinal:  Denies nausea, heartburn or change in bowel habits Skin: Denies abnormal skin rashes Lymphatics: Denies new lymphadenopathy or easy bruising Neurological:Denies numbness, tingling or new weaknesses Behavioral/Psych: Mood is stable, no new changes  All other systems were reviewed with the patient and are negative.  PHYSICAL EXAMINATION: ECOG PERFORMANCE STATUS: 2 - Symptomatic, <50% confined to bed  Filed Vitals:   10/24/14 1058  BP: 108/61  Pulse: 96  Temp: 98.5 F (36.9 C)  Resp: 18   Filed Weights   10/24/14 1058  Weight: 127 lb 6.4 oz (57.788 kg)    GENERAL:alert, no distress and comfortable SKIN: skin color, texture, turgor are normal, no rashes or significant lesions EYES: normal, conjunctiva are pink and non-injected, sclera clear OROPHARYNX:no exudate, no erythema and lips, buccal mucosa, and tongue normal  NECK: supple, thyroid normal size, non-tender, without nodularity LYMPH:  no palpable lymphadenopathy in the cervical, axillary or inguinal LUNGS: clear to auscultation and percussion with normal breathing effort HEART: regular rate & rhythm and no murmurs and no lower extremity edema ABDOMEN:abdomen soft, non-tender and normal bowel sounds Musculoskeletal:no cyanosis  of digits and no clubbing  PSYCH: alert & oriented x 3 with fluent speech NEURO: no focal motor/sensory deficits  LABORATORY DATA:  I have reviewed the data as listed CBC Latest Ref Rng  10/24/2014 10/07/2014  WBC 3.9 - 10.3 10e3/uL  9.7 78.9(HH)  Hemoglobin 11.6 - 15.9 g/dL  10.6(L) 6.0(LL)  Hematocrit 34.8 - 46.6 %  32.5(L) 19.2(L)  Platelets 145 - 400 10e3/uL  301 194   CMP Latest Ref Rng  10/24/2014 10/07/2014  Glucose 70 - 140 mg/dl  76 75  BUN 7.0 - 26.0 mg/dL  10.0 12  Creatinine 0.6 - 1.1 mg/dL  0.6 0.70  Sodium 136 - 145 mEq/L  136 135  Potassium 3.5 - 5.1 mEq/L  3.9 4.2  Chloride 101 - 111 mmol/L  - 110  CO2 22 - 29 mEq/L  26 23  Calcium 8.4 - 10.4 mg/dL  8.9 8.7(L)  Total Protein 6.4 - 8.3 g/dL  9.0(H) 9.9(H)  Total Bilirubin 0.20 - 1.20 mg/dL  0.28 0.3  Alkaline Phos 40 - 150 U/L  137 76  AST 5 - 34 U/L  30 113(H)  ALT 0 - 55 U/L  12 12(L)  SPEP M-protein  09/15/2014: 4.2 10/08/14: 4.6     PATHOLOGY REPORT  Bone Marrow (BM) and Peripheral Blood (PB) 10/10/2014 [facility])  FINAL PATHOLOGIC DIAGNOSIS      BONE MARROW AND PERIPHERAL BLOOD:  Extensive involvement by plasma cell myeloma/leukemia.  See comment and CBC data.      COMMENT:  The bone marrow aspirate smears are spicular, cellular and  adequate for evaluation. Atypical plasma cells comprise 57% of  the cellularity (500 cell manual differential count). The plasma  cells have enlarged nuclei with scattered binucleated forms and  irregular nuclear contour. There is diminished multilineage  hematopoiesis with adequate maturation. Blasts are not increased  in numbers (less than 1% by manual differential counts). 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 There is no overt dysplasia of the  myeloid or erythroid lineages. Megakaryocytes are quantitatively  and qualitatively unremarkable. There is no lymphocytosis.      Examination of the bone marrow core biopsy reveals a markedly  hypercellular marrow for age (95%) with near complete replacement  by sheets of plasma cells. Rare myeloid and erythroid elements  are present. Megakaryocytes are sparse and focally clustered with  hypolobated and hyperchromatic nuclei. There are no atypical  lymphoid aggregates or granulomas identified. Examination of the  bone marrow clot section reveals similar findings.    The peripheral blood demonstrates a leukocytosis comprised of  large plasma cells and normocytic anemia. Rouleaux is present.  There is a myeloid left shift with rare circulating blasts.    By flow cytometry on the peripheral blood, the abnormal plasma  cell population comprises 64% of the cellular elements and  expresses cytoplasmic CD138, CD45 and cLambda with dim expression  of CD19. The plasma cells are negative for surface CD2, CD3, CD4,  CD5, CD7, CD8, CD56, CD10, CD20, sKappa, sLambda, HLA-DR and  cKappa.    Overall, the findings demonstrate extensive involvement by plasma  cell myeloma/leukemia.     RADIOGRAPHIC STUDIES: I have personally reviewed the radiological images as listed and agreed with the findings in the report. Dg Chest 2 View  10/07/2014   CLINICAL DATA:  44 year old female with cough, headache, nausea, vomiting and weakness for the past 2 weeks.  EXAM: CHEST  2 VIEW  COMPARISON:  Chest x-ray 01/2015.  FINDINGS: There are some ill-defined opacities in lung bases bilaterally, which could represent areas of ill-defined peribronchovascular airspace consolidation as can be seen in the setting of mild aspiration or bronchopneumonia. Lungs otherwise appear clear. No pleural effusions. No evidence of pulmonary edema. No pneumothorax. Heart size is normal. Upper mediastinal contours are within normal limits.  IMPRESSION: Ill-defined bibasilar opacities which may reflect sequela of mild aspiration or developing areas of  bronchopneumonia.   Electronically Signed   By: Vinnie Langton M.D.   On: 10/07/2014 10:52   US Abdomen Complete  10/07/2014   CLINICAL DATA:  Right upper quadrant pain for 2 weeks  EXAM: ULTRASOUND ABDOMEN COMPLETE  COMPARISON:  CT abdomen 08/27/2010, ultrasound abdomen 09/14/2014  FINDINGS: Gallbladder: No gallbladder sludge. No gallstones or wall thickening visualized. No sonographic Murphy sign noted.  Common bile duct: Diameter: 3 mm  Liver: 1 cm anechoic right hepatic mass with increased through transmission most consistent with a small cyst. Within normal limits in parenchymal echogenicity.  IVC: No abnormality visualized.  Pancreas: Visualized portion unremarkable.  Spleen: Mild splenic enlargement measuring 12.9 x 13.4 x 9.4 cm with a volume of 143  mL. There is a 9.2 x 4.9 x 4.4 cm complex subcapsular fluid collection with multiple thin internal septations similar in appearance to the prior examination of 09/25/2012 likely reflecting sequela prior trauma versus lymphangioma, but the appearance is unchanged compared with 08/27/2010.  Right Kidney: Length: 11.2 cm. Echogenicity within normal limits. No hydronephrosis. 8 mm hypoechoic right renal mass in the upper pole likely representing a small cyst when correlated with prior CT dated 08/27/2010. Hyperechoic focus in the mid pole of the right kidney likely representing a nonobstructing calculus.  Left Kidney: Length: 10.7 cm. Echogenicity within normal limits. No hydronephrosis. 1.7 x 1.9 x 1.7 cm hypoechoic left renal mass most consistent with a cyst. Echogenic area in the lower pole of the right kidney measuring 2.3 cm likely reflecting calcification.  Abdominal aorta: No aneurysm visualized.  Other findings: None.  IMPRESSION: 1. No cholelithiasis or sonographic evidence of acute cholecystitis. 2. Mild splenomegaly. Stable perisplenic complex fluid collection unchanged compared with 08/27/2010. This may reflect sequela prior insult versus a  lymphangioma. 3. Bilateral nephrolithiasis.  No obstructive uropathy.   Electronically Signed   By: Kathreen Devoid   On: 10/07/2014 10:17    ASSESSMENT & PLAN: 44 year old [place], presented with anemia and leokocytosis   1. Acute plasma cell leukemia -She has started cyborD at [facility] 2 weeks ago -I spoke with her leukemia program coordinator, and clarified her chemotherapy planning. I'll start her Velcade injection 1.5 mg/m, and dexamethasone 40 mg,  twice a week (on Tuesday and Friday), and IV Cytoxan 300 mg/m once a week (on Friday), for 4 weeks (she had two weeks treatment), then change to CyBorD weekly from cycle 2.  -She will follow up with Dr. 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 -Due to her insurance issue, I am not able to start her treatment today, and will start on this Friday 9/23.  -will start her on acyclovir when she is on Velcade -Potential side effects from treatment, especially neuropathy, cytopenia, fatigue, risk of infection, we'll discussed with patient, and she agrees to continue [facility] monitor her blood counts carefully. -I give her prescription of omeprazole, and suggest her to take since she is on high-dose dexamethasone -Continue allopurinol   2. Chronic Hepatitis B  -She started on entecavir at [facility].  -She has a prescription for Entecavir, waiting pharmacy to fill   3. Fatigue and anorexia  -continue supportive care   Plan -start week 3 CyborD on 9/23 -I will see her back in one week   All questions were answered. The patient knows to call the clinic with any problems, questions or concerns. I spent 40 minutes counseling the patient face to face. The total time spent in the appointment was 55 minutes and more than 50% was on counseling.     Truitt Merle, MD 10/24/2014 11:49 AM

## 2014-10-24 ENCOUNTER — Telehealth: Payer: Self-pay | Admitting: Hematology

## 2014-10-24 ENCOUNTER — Ambulatory Visit (HOSPITAL_BASED_OUTPATIENT_CLINIC_OR_DEPARTMENT_OTHER): Payer: 59 | Admitting: Hematology

## 2014-10-24 ENCOUNTER — Ambulatory Visit: Payer: 59

## 2014-10-24 ENCOUNTER — Ambulatory Visit (HOSPITAL_BASED_OUTPATIENT_CLINIC_OR_DEPARTMENT_OTHER): Payer: 59

## 2014-10-24 ENCOUNTER — Telehealth: Payer: Self-pay | Admitting: *Deleted

## 2014-10-24 ENCOUNTER — Encounter: Payer: Self-pay | Admitting: Hematology

## 2014-10-24 VITALS — BP 108/61 | HR 96 | Temp 98.5°F | Resp 18 | Ht 62.0 in | Wt 127.4 lb

## 2014-10-24 DIAGNOSIS — C9010 Plasma cell leukemia not having achieved remission: Secondary | ICD-10-CM | POA: Diagnosis not present

## 2014-10-24 DIAGNOSIS — K759 Inflammatory liver disease, unspecified: Secondary | ICD-10-CM | POA: Insufficient documentation

## 2014-10-24 LAB — CBC WITH DIFFERENTIAL/PLATELET
BASO%: 0.4 % (ref 0.0–2.0)
BASOS ABS: 0 10*3/uL (ref 0.0–0.1)
EOS ABS: 0.1 10*3/uL (ref 0.0–0.5)
EOS%: 0.9 % (ref 0.0–7.0)
HCT: 32.5 % — ABNORMAL LOW (ref 34.8–46.6)
HEMOGLOBIN: 10.6 g/dL — AB (ref 11.6–15.9)
LYMPH%: 16.5 % (ref 14.0–49.7)
MCH: 29.5 pg (ref 25.1–34.0)
MCHC: 32.6 g/dL (ref 31.5–36.0)
MCV: 90.6 fL (ref 79.5–101.0)
MONO#: 1.1 10*3/uL — AB (ref 0.1–0.9)
MONO%: 11.5 % (ref 0.0–14.0)
NEUT%: 70.7 % (ref 38.4–76.8)
NEUTROS ABS: 6.8 10*3/uL — AB (ref 1.5–6.5)
PLATELETS: 301 10*3/uL (ref 145–400)
RBC: 3.59 10*6/uL — ABNORMAL LOW (ref 3.70–5.45)
RDW: 20.6 % — AB (ref 11.2–14.5)
WBC: 9.7 10*3/uL (ref 3.9–10.3)
lymph#: 1.6 10*3/uL (ref 0.9–3.3)

## 2014-10-24 LAB — COMPREHENSIVE METABOLIC PANEL (CC13)
ALBUMIN: 2.4 g/dL — AB (ref 3.5–5.0)
ALK PHOS: 137 U/L (ref 40–150)
ALT: 12 U/L (ref 0–55)
ANION GAP: 4 meq/L (ref 3–11)
AST: 30 U/L (ref 5–34)
BILIRUBIN TOTAL: 0.28 mg/dL (ref 0.20–1.20)
BUN: 10 mg/dL (ref 7.0–26.0)
CALCIUM: 8.9 mg/dL (ref 8.4–10.4)
CO2: 26 mEq/L (ref 22–29)
Chloride: 107 mEq/L (ref 98–109)
Creatinine: 0.6 mg/dL (ref 0.6–1.1)
Glucose: 76 mg/dl (ref 70–140)
POTASSIUM: 3.9 meq/L (ref 3.5–5.1)
SODIUM: 136 meq/L (ref 136–145)
TOTAL PROTEIN: 9 g/dL — AB (ref 6.4–8.3)

## 2014-10-24 LAB — LACTATE DEHYDROGENASE (CC13): LDH: 285 U/L — AB (ref 125–245)

## 2014-10-24 LAB — TECHNOLOGIST REVIEW

## 2014-10-24 LAB — MAGNESIUM (CC13): MAGNESIUM: 1.8 mg/dL (ref 1.5–2.5)

## 2014-10-24 MED ORDER — CYCLOPHOSPHAMIDE 50 MG PO TABS: 300.0000 mg/m2 | ORAL_TABLET | ORAL | Status: DC

## 2014-10-24 MED ORDER — LIDOCAINE-PRILOCAINE 2.5-2.5 % EX CREA: 1.0000 "application " | TOPICAL_CREAM | CUTANEOUS | Status: DC | PRN

## 2014-10-24 MED ORDER — DEXAMETHASONE 4 MG PO TABS: ORAL_TABLET | ORAL | Status: DC

## 2014-10-24 MED ORDER — OMEPRAZOLE 20 MG PO CPDR: 20.0000 mg | DELAYED_RELEASE_CAPSULE | Freq: Every day | ORAL | Status: DC

## 2014-10-24 MED ORDER — ACYCLOVIR 400 MG PO TABS: 400.0000 mg | ORAL_TABLET | Freq: Two times a day (BID) | ORAL | Status: DC

## 2014-10-24 NOTE — Telephone Encounter (Signed)
per pof to sch pt appt-sent MW email to sch trmt-gave pt copy of avs-adv sch subject to change due to infusion availability

## 2014-10-24 NOTE — Telephone Encounter (Signed)
perpof tos ch pt appt-MW sch pt inf appts-gave pt copy of avs

## 2014-10-24 NOTE — Telephone Encounter (Signed)
Per staff message and POF I have scheduled appts. Advised scheduler of appts. JMW  

## 2014-10-26 ENCOUNTER — Other Ambulatory Visit: Payer: 59

## 2014-10-26 ENCOUNTER — Ambulatory Visit: Payer: 59

## 2014-10-27 ENCOUNTER — Ambulatory Visit: Payer: 59

## 2014-10-27 ENCOUNTER — Other Ambulatory Visit (HOSPITAL_BASED_OUTPATIENT_CLINIC_OR_DEPARTMENT_OTHER): Payer: 59

## 2014-10-27 ENCOUNTER — Other Ambulatory Visit: Payer: 59

## 2014-10-27 ENCOUNTER — Ambulatory Visit (HOSPITAL_BASED_OUTPATIENT_CLINIC_OR_DEPARTMENT_OTHER): Payer: 59

## 2014-10-27 VITALS — BP 100/50 | HR 89 | Temp 98.1°F | Resp 20

## 2014-10-27 DIAGNOSIS — C9010 Plasma cell leukemia not having achieved remission: Secondary | ICD-10-CM

## 2014-10-27 DIAGNOSIS — Z5112 Encounter for antineoplastic immunotherapy: Secondary | ICD-10-CM | POA: Diagnosis not present

## 2014-10-27 LAB — COMPREHENSIVE METABOLIC PANEL (CC13)
ALBUMIN: 2.5 g/dL — AB (ref 3.5–5.0)
ALK PHOS: 146 U/L (ref 40–150)
ALT: 10 U/L (ref 0–55)
ANION GAP: 4 meq/L (ref 3–11)
AST: 31 U/L (ref 5–34)
BUN: 9.3 mg/dL (ref 7.0–26.0)
CALCIUM: 8.5 mg/dL (ref 8.4–10.4)
CO2: 23 mEq/L (ref 22–29)
CREATININE: 0.6 mg/dL (ref 0.6–1.1)
Chloride: 108 mEq/L (ref 98–109)
EGFR: >90 mL/min/{1.73_m2} (ref >90)
Glucose: 113 mg/dl (ref 70–140)
Potassium: 4 mEq/L (ref 3.5–5.1)
Sodium: 135 mEq/L — ABNORMAL LOW (ref 136–145)
TOTAL PROTEIN: 9.3 g/dL — AB (ref 6.4–8.3)

## 2014-10-27 LAB — CBC WITH DIFFERENTIAL/PLATELET
BASO%: 0.5 % (ref 0.0–2.0)
Basophils Absolute: 0 10*3/uL (ref 0.0–0.1)
EOS ABS: 0.1 10*3/uL (ref 0.0–0.5)
EOS%: 0.8 % (ref 0.0–7.0)
HEMATOCRIT: 32.9 % — AB (ref 34.8–46.6)
HEMOGLOBIN: 10.5 g/dL — AB (ref 11.6–15.9)
LYMPH#: 1.5 10*3/uL (ref 0.9–3.3)
LYMPH%: 17.6 % (ref 14.0–49.7)
MCH: 29.6 pg (ref 25.1–34.0)
MCHC: 31.9 g/dL (ref 31.5–36.0)
MCV: 92.7 fL (ref 79.5–101.0)
MONO#: 0.6 10*3/uL (ref 0.1–0.9)
MONO%: 7.4 % (ref 0.0–14.0)
NEUT%: 73.7 % (ref 38.4–76.8)
NEUTROS ABS: 6.1 10*3/uL (ref 1.5–6.5)
NRBC: 0 % (ref 0–0)
PLATELETS: 319 10*3/uL (ref 145–400)
RBC: 3.55 10*6/uL — ABNORMAL LOW (ref 3.70–5.45)
RDW: 19.4 % — ABNORMAL HIGH (ref 11.2–14.5)
WBC: 8.3 10*3/uL (ref 3.9–10.3)

## 2014-10-27 LAB — MAGNESIUM (CC13): Magnesium: 1.9 mg/dl (ref 1.5–2.5)

## 2014-10-27 LAB — TECHNOLOGIST REVIEW

## 2014-10-27 MED ORDER — ONDANSETRON HCL 8 MG PO TABS
ORAL_TABLET | ORAL | Status: AC
  Filled 2014-10-27: qty 1

## 2014-10-27 MED ORDER — SODIUM CHLORIDE 0.9 % IV SOLN
300.0000 mg/m2 | Freq: Once | INTRAVENOUS | Status: AC
  Administered 2014-10-27: 480 mg via INTRAVENOUS
  Filled 2014-10-27: qty 24

## 2014-10-27 MED ORDER — BORTEZOMIB CHEMO SQ INJECTION 3.5 MG (2.5MG/ML)
1.5000 mg/m2 | Freq: Once | INTRAMUSCULAR | Status: AC
  Administered 2014-10-27: 2.5 mg via SUBCUTANEOUS
  Filled 2014-10-27: qty 2.5

## 2014-10-27 MED ORDER — SODIUM CHLORIDE 0.9 % IV SOLN
Freq: Once | INTRAVENOUS | Status: AC
  Administered 2014-10-27: 13:00:00 via INTRAVENOUS
  Filled 2014-10-27: qty 4

## 2014-10-27 MED ORDER — ONDANSETRON HCL 8 MG PO TABS: 8.0000 mg | ORAL_TABLET | Freq: Once | ORAL | Status: DC

## 2014-10-27 NOTE — Patient Instructions (Signed)
Bortezomib injection What is this medicine? BORTEZOMIB (bor TEZ oh mib) is a chemotherapy drug. It slows the growth of cancer cells. This medicine is used to treat multiple myeloma, and certain lymphomas, such as mantle-cell lymphoma. This medicine may be used for other purposes; ask your health care provider or pharmacist if you have questions. COMMON BRAND NAME(S): Velcade What should I tell my health care provider before I take this medicine? They need to know if you have any of these conditions: -diabetes -heart disease -irregular heartbeat -liver disease -on hemodialysis -low blood counts, like low white blood cells, platelets, or hemoglobin -peripheral neuropathy -taking medicine for blood pressure -an unusual or allergic reaction to bortezomib, mannitol, boron, other medicines, foods, dyes, or preservatives -pregnant or trying to get pregnant -breast-feeding How should I use this medicine? This medicine is for injection into a vein or for injection under the skin. It is given by a health care professional in a hospital or clinic setting. Talk to your pediatrician regarding the use of this medicine in children. Special care may be needed. Overdosage: If you think you have taken too much of this medicine contact a poison control center or emergency room at once. NOTE: This medicine is only for you. Do not share this medicine with others. What if I miss a dose? It is important not to miss your dose. Call your doctor or health care professional if you are unable to keep an appointment. What may interact with this medicine? This medicine may interact with the following medications: -ketoconazole -rifampin -ritonavir -St. John's Wort This list may not describe all possible interactions. Give your health care provider a list of all the medicines, herbs, non-prescription drugs, or dietary supplements you use. Also tell them if you smoke, drink alcohol, or use illegal drugs. Some items  may interact with your medicine. What should I watch for while using this medicine? Visit your doctor for checks on your progress. This drug may make you feel generally unwell. This is not uncommon, as chemotherapy can affect healthy cells as well as cancer cells. Report any side effects. Continue your course of treatment even though you feel ill unless your doctor tells you to stop. You may get drowsy or dizzy. Do not drive, use machinery, or do anything that needs mental alertness until you know how this medicine affects you. Do not stand or sit up quickly, especially if you are an older patient. This reduces the risk of dizzy or fainting spells. In some cases, you may be given additional medicines to help with side effects. Follow all directions for their use. Call your doctor or health care professional for advice if you get a fever, chills or sore throat, or other symptoms of a cold or flu. Do not treat yourself. This drug decreases your body's ability to fight infections. Try to avoid being around people who are sick. This medicine may increase your risk to bruise or bleed. Call your doctor or health care professional if you notice any unusual bleeding. You may need blood work done while you are taking this medicine. In some patients, this medicine may cause a serious brain infection that may cause death. If you have any problems seeing, thinking, speaking, walking, or standing, tell your doctor right away. If you cannot reach your doctor, urgently seek other source of medical care. Do not become pregnant while taking this medicine. Women should inform their doctor if they wish to become pregnant or think they might be pregnant. There is   a potential for serious side effects to an unborn child. Talk to your health care professional or pharmacist for more information. Do not breast-feed an infant while taking this medicine. Check with your doctor or health care professional if you get an attack of  severe diarrhea, nausea and vomiting, or if you sweat a lot. The loss of too much body fluid can make it dangerous for you to take this medicine. What side effects may I notice from receiving this medicine? Side effects that you should report to your doctor or health care professional as soon as possible: -allergic reactions like skin rash, itching or hives, swelling of the face, lips, or tongue -breathing problems -changes in hearing -changes in vision -fast, irregular heartbeat -feeling faint or lightheaded, falls -pain, tingling, numbness in the hands or feet -right upper belly pain -seizures -swelling of the ankles, feet, hands -unusual bleeding or bruising -unusually weak or tired -vomiting -yellowing of the eyes or skin Side effects that usually do not require medical attention (report to your doctor or health care professional if they continue or are bothersome): -changes in emotions or moods -constipation -diarrhea -loss of appetite -headache -irritation at site where injected -nausea This list may not describe all possible side effects. Call your doctor for medical advice about side effects. You may report side effects to FDA at 1-800-FDA-1088. Where should I keep my medicine? This drug is given in a hospital or clinic and will not be stored at home. NOTE: This sheet is a summary. It may not cover all possible information. If you have questions about this medicine, talk to your doctor, pharmacist, or health care provider.  2015, [employer]. (2012-11-15 12:46:32) Cyclophosphamide injection What is this medicine? CYCLOPHOSPHAMIDE (sye kloe FOSS fa mide) is a chemotherapy drug. It slows the growth of cancer cells. This medicine is used to treat many types of cancer like lymphoma, myeloma, leukemia, breast cancer, and ovarian cancer, to name a few. This medicine may be used for other purposes; ask your health care provider or pharmacist if you have  questions. COMMON BRAND NAME(S): Cytoxan, Neosar What should I tell my health care provider before I take this medicine? They need to know if you have any of these conditions: -blood disorders -history of other chemotherapy -infection -kidney disease -liver disease -recent or ongoing radiation therapy -tumors in the bone marrow -an unusual or allergic reaction to cyclophosphamide, other chemotherapy, other medicines, foods, dyes, or preservatives -pregnant or trying to get pregnant -breast-feeding How should I use this medicine? This drug is usually given as an injection into a vein or muscle or by infusion into a vein. It is administered in a hospital or clinic by a specially trained health care professional. Talk to your pediatrician regarding the use of this medicine in children. Special care may be needed. Overdosage: If you think you have taken too much of this medicine contact a poison control center or emergency room at once. NOTE: This medicine is only for you. Do not share this medicine with others. What if I miss a dose? It is important not to miss your dose. Call your doctor or health care professional if you are unable to keep an appointment. What may interact with this medicine? This medicine may interact with the following medications: -amiodarone -amphotericin B -azathioprine -certain antiviral medicines for HIV or AIDS such as protease inhibitors (e.g., indinavir, ritonavir) and zidovudine -certain blood pressure medications such as benazepril, captopril, enalapril, fosinopril, lisinopril, moexipril, monopril, perindopril, quinapril, ramipril, trandolapril -  certain cancer medications such as anthracyclines (e.g., daunorubicin, doxorubicin), busulfan, cytarabine, paclitaxel, pentostatin, tamoxifen, trastuzumab -certain diuretics such as chlorothiazide, chlorthalidone, hydrochlorothiazide, indapamide, metolazone -certain medicines that treat or prevent blood clots like  warfarin -certain muscle relaxants such as succinylcholine -cyclosporine -etanercept -indomethacin -medicines to increase blood counts like filgrastim, pegfilgrastim, sargramostim -medicines used as general anesthesia -metronidazole -natalizumab This list may not describe all possible interactions. Give your health care provider a list of all the medicines, herbs, non-prescription drugs, or dietary supplements you use. Also tell them if you smoke, drink alcohol, or use illegal drugs. Some items may interact with your medicine. What should I watch for while using this medicine? Visit your doctor for checks on your progress. This drug may make you feel generally unwell. This is not uncommon, as chemotherapy can affect healthy cells as well as cancer cells. Report any side effects. Continue your course of treatment even though you feel ill unless your doctor tells you to stop. Drink water or other fluids as directed. Urinate often, even at night. In some cases, you may be given additional medicines to help with side effects. Follow all directions for their use. Call your doctor or health care professional for advice if you get a fever, chills or sore throat, or other symptoms of a cold or flu. Do not treat yourself. This drug decreases your body's ability to fight infections. Try to avoid being around people who are sick. This medicine may increase your risk to bruise or bleed. Call your doctor or health care professional if you notice any unusual bleeding. Be careful brushing and flossing your teeth or using a toothpick because you may get an infection or bleed more easily. If you have any dental work done, tell your dentist you are receiving this medicine. You may get drowsy or dizzy. Do not drive, use machinery, or do anything that needs mental alertness until you know how this medicine affects you. Do not become pregnant while taking this medicine or for 1 year after stopping it. Women should  inform their doctor if they wish to become pregnant or think they might be pregnant. Men should not father a child while taking this medicine and for 4 months after stopping it. There is a potential for serious side effects to an unborn child. Talk to your health care professional or pharmacist for more information. Do not breast-feed an infant while taking this medicine. This medicine may interfere with the ability to have a child. This medicine has caused ovarian failure in some women. This medicine has caused reduced sperm counts in some men. You should talk with your doctor or health care professional if you are concerned about your fertility. If you are going to have surgery, tell your doctor or health care professional that you have taken this medicine. What side effects may I notice from receiving this medicine? Side effects that you should report to your doctor or health care professional as soon as possible: -allergic reactions like skin rash, itching or hives, swelling of the face, lips, or tongue -low blood counts - this medicine may decrease the number of white blood cells, red blood cells and platelets. You may be at increased risk for infections and bleeding. -signs of infection - fever or chills, cough, sore throat, pain or difficulty passing urine -signs of decreased platelets or bleeding - bruising, pinpoint red spots on the skin, black, tarry stools, blood in the urine -signs of decreased red blood cells - unusually weak or tired, fainting  spells, lightheadedness -breathing problems -dark urine -dizziness -palpitations -swelling of the ankles, feet, hands -trouble passing urine or change in the amount of urine -weight gain -yellowing of the eyes or skin Side effects that usually do not require medical attention (report to your doctor or health care professional if they continue or are bothersome): -changes in nail or skin color -hair loss -missed menstrual periods -mouth  sores -nausea, vomiting This list may not describe all possible side effects. Call your doctor for medical advice about side effects. You may report side effects to FDA at 1-800-FDA-1088. Where should I keep my medicine? This drug is given in a hospital or clinic and will not be stored at home. NOTE: This sheet is a summary. It may not cover all possible information. If you have questions about this medicine, talk to your doctor, pharmacist, or health care provider.  2015, [employer]. (2011-12-05 16:22:58)

## 2014-10-29 ENCOUNTER — Encounter: Payer: Self-pay | Admitting: Hematology

## 2014-10-30 ENCOUNTER — Telehealth: Payer: Self-pay | Admitting: *Deleted

## 2014-10-30 NOTE — Telephone Encounter (Signed)
-----   Message from Cora Collum, RN sent at 10/27/2014  1:01 PM EDT ----- Regarding: Dr. Sunday Corn follow up call 1st time Velcade and cytoxan... Dr. Burr Medico

## 2014-10-30 NOTE — Telephone Encounter (Signed)
Left message to call back to let us know how she did with her treatment last week.

## 2014-10-31 ENCOUNTER — Ambulatory Visit (HOSPITAL_BASED_OUTPATIENT_CLINIC_OR_DEPARTMENT_OTHER): Payer: 59

## 2014-10-31 ENCOUNTER — Other Ambulatory Visit: Payer: Self-pay | Admitting: Neurology

## 2014-10-31 ENCOUNTER — Ambulatory Visit: Payer: 59

## 2014-10-31 ENCOUNTER — Ambulatory Visit (HOSPITAL_BASED_OUTPATIENT_CLINIC_OR_DEPARTMENT_OTHER): Payer: 59 | Admitting: Hematology

## 2014-10-31 ENCOUNTER — Other Ambulatory Visit (HOSPITAL_BASED_OUTPATIENT_CLINIC_OR_DEPARTMENT_OTHER): Payer: 59

## 2014-10-31 ENCOUNTER — Encounter: Payer: Self-pay | Admitting: Hematology

## 2014-10-31 VITALS — BP 119/64 | HR 107 | Temp 98.3°F | Resp 18 | Ht 62.0 in | Wt 126.4 lb

## 2014-10-31 DIAGNOSIS — C9010 Plasma cell leukemia not having achieved remission: Secondary | ICD-10-CM | POA: Diagnosis not present

## 2014-10-31 DIAGNOSIS — Z5112 Encounter for antineoplastic immunotherapy: Secondary | ICD-10-CM | POA: Diagnosis not present

## 2014-10-31 DIAGNOSIS — Z4802 Encounter for removal of sutures: Secondary | ICD-10-CM

## 2014-10-31 LAB — CBC WITH DIFFERENTIAL/PLATELET
BASO%: 0.4 % (ref 0.0–2.0)
Basophils Absolute: 0 10*3/uL (ref 0.0–0.1)
EOS%: 0.5 % (ref 0.0–7.0)
Eosinophils Absolute: 0 10*3/uL (ref 0.0–0.5)
HCT: 31.8 % — ABNORMAL LOW (ref 34.8–46.6)
HGB: 10.5 g/dL — ABNORMAL LOW (ref 11.6–15.9)
LYMPH%: 16.1 % (ref 14.0–49.7)
MCH: 30.1 pg (ref 25.1–34.0)
MCHC: 33.1 g/dL (ref 31.5–36.0)
MCV: 91 fL (ref 79.5–101.0)
MONO#: 0.5 10*3/uL (ref 0.1–0.9)
MONO%: 6.3 % (ref 0.0–14.0)
NEUT%: 76.7 % (ref 38.4–76.8)
NEUTROS ABS: 5.9 10*3/uL (ref 1.5–6.5)
Platelets: 313 10*3/uL (ref 145–400)
RBC: 3.5 10*6/uL — AB (ref 3.70–5.45)
RDW: 19.9 % — ABNORMAL HIGH (ref 11.2–14.5)
WBC: 7.7 10*3/uL (ref 3.9–10.3)
lymph#: 1.2 10*3/uL (ref 0.9–3.3)

## 2014-10-31 LAB — COMPREHENSIVE METABOLIC PANEL (CC13)
ALBUMIN: 2.6 g/dL — AB (ref 3.5–5.0)
ALK PHOS: 154 U/L — AB (ref 40–150)
ALT: 10 U/L (ref 0–55)
AST: 26 U/L (ref 5–34)
Anion Gap: 6 mEq/L (ref 3–11)
BILIRUBIN TOTAL: 0.32 mg/dL (ref 0.20–1.20)
BUN: 9.4 mg/dL (ref 7.0–26.0)
CO2: 23 mEq/L (ref 22–29)
CREATININE: 0.7 mg/dL (ref 0.6–1.1)
Calcium: 9 mg/dL (ref 8.4–10.4)
Chloride: 105 mEq/L (ref 98–109)
EGFR: >90 mL/min/{1.73_m2} (ref >90)
GLUCOSE: 134 mg/dL (ref 70–140)
Potassium: 4 mEq/L (ref 3.5–5.1)
SODIUM: 134 meq/L — AB (ref 136–145)
TOTAL PROTEIN: 9.1 g/dL — AB (ref 6.4–8.3)

## 2014-10-31 LAB — TECHNOLOGIST REVIEW

## 2014-10-31 LAB — MAGNESIUM (CC13): Magnesium: 1.5 mg/dl (ref 1.5–2.5)

## 2014-10-31 MED ORDER — ONDANSETRON HCL 8 MG PO TABS
ORAL_TABLET | ORAL | Status: AC
  Filled 2014-10-31: qty 1

## 2014-10-31 MED ORDER — BORTEZOMIB CHEMO SQ INJECTION 3.5 MG (2.5MG/ML)
1.5000 mg/m2 | Freq: Once | INTRAMUSCULAR | Status: AC
  Administered 2014-10-31: 2.5 mg via SUBCUTANEOUS
  Filled 2014-10-31: qty 2.5

## 2014-10-31 MED ORDER — ONDANSETRON HCL 8 MG PO TABS
8.0000 mg | ORAL_TABLET | Freq: Once | ORAL | Status: AC
  Administered 2014-10-31: 8 mg via ORAL

## 2014-10-31 NOTE — Progress Notes (Signed)
Dr Burr Medico ordered  to remove stiches from port site today .

## 2014-10-31 NOTE — Progress Notes (Signed)
[place]  [phone] [phone]  Clinic Follow up Note   No care team member to display 10/31/2014    CHIEF COMPLAINTS:  Follow up a cute plasma cell leukemia   HISTORY OF PRESENTING ILLNESS:  Sheryl [name] 44 y.o. female is here because of recently diagnosed plasma cell leukemia. She was recently discharged form [facility] 3 days ago and is here to establish her local oncological care with [place]. She is a [place], does not speak [place], she is accompanied to the clinic by her daughter and interpreter.  She presented to our hospital lth with worsening dyspnea, fatigue, cough, subjective fevers and chills, and was admitted on 09/14/2014. She was found to have allergy WBC 54.1K, hemoglobin 8.1, plt 310, she was treated with symptom management, and was discharged home on 09/17/2014, with a plan to follow-up with hematology. She represents to emergency room on 10/07/2014, was found to have white count of 78K, and worsening anemia with hemoglobin 6. She was seen by my partner Dr. Jonette Eva and plasma cell leukemia was suspected. She was transferred to [facility] for further leukemia work-up and treatment. Bone marrow biopsy was done, which confirmed plasma cell leukemia, and she started chemotherapy with Velcade, Cytoxan and dexamethasone. She received weekly dose twice, last dose on 10/20/2014. She was discharged to home afterwards. She had a mediport placed during her hospitalization.  She has moderate fatigue, low appetie, she lost about 13 lbs in the past few months. She has moderate abdominal pain, 5-6/10, persistent, she does not take any pain meds.   I reviewed her medical records extensively, and discussed her case with Dr. Jorje Guild and [facility] program coordinator.  INTERIM HISTORY: Sheryl [name] returns for follow up. She tolerated CyborD well last Friday, no nausea or other side effects. She has had intermittent some warmness, but she does not  have a thermometer at home. Her appetite is low, she eats small meals. Drinks adequately.  Mild headache, no abdominal pain or nausea, she is little stronger   MEDICAL HISTORY:  Past Medical History  Diagnosis Date  . GERD (gastroesophageal reflux disease)   . Hepatitis   . History of positive PPD     DX 2011--  CXR DONE NO EVIDENCE  . Hydronephrosis, right   . Right ureteral stone   . History of ureter stent   . Chills with fever     intermittently since d/c from hospital  . Neuromuscular disorder     legs numb intermittently  . Dysuria-frequency syndrome     w/ pink urine  . Urosepsis 8/14    admitted to [facility]  . Pneumonia     SURGICAL HISTORY: Past Surgical History  Procedure Laterality Date  . Cystoscopy w/ ureteral stent placement Right 09/25/2012    Procedure: CYSTOSCOPY WITH RETROGRADE PYELOGRAM/URETERAL STENT PLACEMENT;  Surgeon: Alexis Frock, MD;  Location: [facility];  Service: Urology;  Laterality: Right;  . Liver biopsy    . Right vats w/ drainage peural effusion and bx's  10-30-2008  . Removal of uterine cyst      years ago  . Other surgical history Right     removal of ovarian cyst  . Cystoscopy with retrograde pyelogram, ureteroscopy and stent placement Right 10/15/2012    Procedure: CYSTOSCOPY WITH RETROGRADE PYELOGRAM, URETEROSCOPY AND REMOVAL STENT WITH  STENT PLACEMENT;  Surgeon: Alexis Frock, MD;  Location: [facility];  Service: Urology;  Laterality: Right;  . Holmium laser application Right 1/66/0630  Procedure: HOLMIUM LASER APPLICATION;  Surgeon: Alexis Frock, MD;  Location: [facility];  Service: Urology;  Laterality: Right;    SOCIAL HISTORY: Social History   Social History  . Marital Status: Single    Spouse Name: N/A  . Number of Children: 15   . Years of Education: N/A   Occupational History  . Not on file.   Social History Main Topics  . Smoking status: Never Smoker   . Smokeless tobacco: Not on file    . Alcohol Use: No  . Drug Use: No  . Sexual Activity: Not Currently    Birth Control/ Protection: Abstinence   Other Topics Concern  . Not on file   Social History Narrative    FAMILY HISTORY: Family History  Problem Relation Age of Onset  . Stomach cancer Mother   . Lung disease Father     ALLERGIES:  has No Known Allergies.  MEDICATIONS:  Current Outpatient Prescriptions  Medication Sig Dispense Refill  . acyclovir (ZOVIRAX) 400 MG tablet Take 1 tablet (400 mg total) by mouth 2 (two) times daily. 60 tablet 3  . allopurinol (ZYLOPRIM) 300 MG tablet Take 300 mg by mouth.    . dexamethasone (DECADRON) 4 MG tablet Take 10 tab on the day of Velcade injection (every Tuesday and Thursday until 10/6, then every Thursday) 40 tablet 2  . entecavir (BARACLUDE) 0.5 MG tablet Take 0.5 mg by mouth.    . lidocaine-prilocaine (EMLA) cream Apply 1 application topically as needed. 30 g 6  . omeprazole (PRILOSEC) 20 MG capsule Take 1 capsule (20 mg total) by mouth daily. 30 capsule 2   No current facility-administered medications for this visit.    REVIEW OF SYSTEMS:   Constitutional: Denies fevers, chills or abnormal night sweats Eyes: Denies blurriness of vision, double vision or watery eyes Ears, nose, mouth, throat, and face: Denies mucositis or sore throat Respiratory: Denies cough, dyspnea or wheezes Cardiovascular: Denies palpitation, chest discomfort or lower extremity swelling Gastrointestinal:  Denies nausea, heartburn or change in bowel habits Skin: Denies abnormal skin rashes Lymphatics: Denies new lymphadenopathy or easy bruising Neurological:Denies numbness, tingling or new weaknesses Behavioral/Psych: Mood is stable, no new changes  All other systems were reviewed with the patient and are negative.  PHYSICAL EXAMINATION: ECOG PERFORMANCE STATUS: 2 - Symptomatic, <50% confined to bed  Filed Vitals:   10/31/14 0854  BP: 119/64  Pulse: 107  Temp: 98.3 F (36.8 C)   Resp: 18   Filed Weights   10/31/14 0854  Weight: 126 lb 6.4 oz (57.335 kg)    GENERAL:alert, no distress and comfortable SKIN: skin color, texture, turgor are normal, no rashes or significant lesions EYES: normal, conjunctiva are pink and non-injected, sclera clear OROPHARYNX:no exudate, no erythema and lips, buccal mucosa, and tongue normal  NECK: supple, thyroid normal size, non-tender, without nodularity LYMPH:  no palpable lymphadenopathy in the cervical, axillary or inguinal LUNGS: clear to auscultation and percussion with normal breathing effort HEART: regular rate & rhythm and no murmurs and no lower extremity edema ABDOMEN:abdomen soft, non-tender and normal bowel sounds Musculoskeletal:no cyanosis of digits and no clubbing  PSYCH: alert & oriented x 3 with fluent speech NEURO: no focal motor/sensory deficits  LABORATORY DATA:  I have reviewed the data as listed CBC Latest Ref Rng 10/31/2014 10/27/2014 10/24/2014  WBC 3.9 - 10.3 10e3/uL 7.7 8.3 9.7  Hemoglobin 11.6 - 15.9 g/dL 10.5(L) 10.5(L) 10.6(L)  Hematocrit 34.8 - 46.6 % 31.8(L) 32.9(L) 32.5(L)  Platelets  145 - 400 10e3/uL 313 319 301    CMP Latest Ref Rng 10/31/2014 10/27/2014 10/24/2014  Glucose 70 - 140 mg/dl 134 113 76  BUN 7.0 - 26.0 mg/dL 9.4 9.3 10.0  Creatinine 0.6 - 1.1 mg/dL 0.7 0.6 0.6  Sodium 136 - 145 mEq/L 134(L) 135(L) 136  Potassium 3.5 - 5.1 mEq/L 4.0 4.0 3.9  Chloride 101 - 111 mmol/L - - -  CO2 22 - 29 mEq/L _0 Calcium 8.4 - 10.4 mg/dL 9.0 8.5 8.9  Total Protein 6.4 - 8.3 g/dL 9.1(H) 9.3(H) 9.0(H)  Total Bilirubin 0.20 - 1.20 mg/dL 0.32 <0.30 0.28  Alkaline Phos 40 - 150 U/L 154(H) 146 137  AST 5 - 34 U/L _1 ALT 0 - 55 U/L _2 SPEP M-protein  09/15/2014: 4.2 10/08/14: 4.6     PATHOLOGY REPORT  Bone Marrow (BM) and Peripheral Blood (PB) 10/10/2014 [facility])  FINAL PATHOLOGIC DIAGNOSIS      BONE MARROW AND PERIPHERAL BLOOD:  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  See comment and CBC data.      COMMENT:  The bone marrow aspirate smears are spicular, cellular and  adequate for evaluation. Atypical plasma cells comprise 57% of  the cellularity (500 cell manual differential count). The plasma  cells have enlarged nuclei with scattered binucleated forms and  irregular nuclear contour. There is diminished multilineage  hematopoiesis with adequate maturation. Blasts are not increased  in numbers (less than 1% by manual differential counts). 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 There is no overt dysplasia of the  myeloid or erythroid lineages. Megakaryocytes are quantitatively  and qualitatively unremarkable. There is no lymphocytosis.    Examination of the bone marrow core biopsy reveals a markedly  hypercellular marrow for age (95%) with near complete replacement  by sheets of plasma cells. Rare myeloid and erythroid elements  are present. Megakaryocytes are sparse and focally clustered with  hypolobated and hyperchromatic nuclei. There are no atypical  lymphoid aggregates or granulomas identified. Examination of the  bone marrow clot section reveals similar findings.    The peripheral blood demonstrates a leukocytosis comprised of  large plasma cells and normocytic anemia. Rouleaux is present.  There is a myeloid left shift with rare circulating blasts.    By flow cytometry on the peripheral blood, the abnormal plasma  cell population comprises 64% of the cellular elements and  expresses cytoplasmic CD138, CD45 and cLambda with dim expression  of CD19. The plasma cells are negative for surface CD2, CD3, CD4,  CD5, CD7, CD8, CD56, CD10, CD20, sKappa, sLambda, HLA-DR and  cKappa.    Overall, the findings demonstrate extensive involvement by plasma  cell myeloma/leukemia.     RADIOGRAPHIC STUDIES: I have  personally reviewed the radiological images as listed and agreed with the findings in the report. Dg Chest 2 View  10/07/2014   CLINICAL DATA:  44 year old female with cough, headache, nausea, vomiting and weakness for the past 2 weeks.  EXAM: CHEST  2 VIEW  COMPARISON:  Chest x-ray 01/2015.  FINDINGS: There are some ill-defined opacities in lung bases bilaterally, which could represent areas of ill-defined peribronchovascular airspace consolidation as can be seen in the setting of mild aspiration or bronchopneumonia. Lungs otherwise appear clear. No pleural effusions. No evidence of pulmonary edema. No pneumothorax. Heart size is normal. Upper mediastinal contours are within normal limits.  IMPRESSION: Ill-defined bibasilar opacities which may reflect sequela of mild aspiration or developing areas of bronchopneumonia.   Electronically Signed   By: Vinnie Langton M.D.   On: 10/07/2014 10:52   US Abdomen Complete  10/07/2014   CLINICAL DATA:  Right upper quadrant pain for 2 weeks  EXAM: ULTRASOUND ABDOMEN COMPLETE  COMPARISON:  CT abdomen 08/27/2010, ultrasound abdomen 09/14/2014  FINDINGS: Gallbladder: No gallbladder sludge. No gallstones or wall thickening visualized. No sonographic Murphy sign noted.  Common bile duct: Diameter: 3 mm  Liver: 1 cm anechoic right hepatic mass with increased through transmission most consistent with a small cyst. Within normal limits in parenchymal echogenicity.  IVC: No abnormality visualized.  Pancreas: Visualized portion unremarkable.  Spleen: Mild splenic enlargement measuring 12.9 x 13.4 x 9.4 cm with a volume of 143 mL. There is a 9.2 x 4.9 x 4.4 cm complex subcapsular fluid collection with multiple thin internal septations similar in appearance to the prior examination of 09/25/2012 likely reflecting sequela prior trauma versus lymphangioma, but the appearance is unchanged compared with 08/27/2010.  Right Kidney: Length: 11.2 cm. Echogenicity within normal limits. No  hydronephrosis. 8 mm hypoechoic right renal mass in the upper pole likely representing a small cyst when correlated with prior CT dated 08/27/2010. Hyperechoic focus in the mid pole of the right kidney likely representing a nonobstructing calculus.  Left Kidney: Length: 10.7 cm. Echogenicity within normal limits. No hydronephrosis. 1.7 x 1.9 x 1.7 cm hypoechoic left renal mass most consistent with a cyst. Echogenic area in the lower pole of the right kidney measuring 2.3 cm likely reflecting calcification.  Abdominal aorta: No aneurysm visualized.  Other findings: None.  IMPRESSION: 1. No cholelithiasis or sonographic evidence of acute cholecystitis. 2. Mild splenomegaly. Stable perisplenic complex fluid collection unchanged compared with 08/27/2010. This may reflect sequela prior insult versus a lymphangioma. 3. Bilateral nephrolithiasis.  No obstructive uropathy.   Electronically Signed   By: Kathreen Devoid   On: 10/07/2014 10:17    ASSESSMENT & PLAN: 44 year old [place], presented with anemia and leokocytosis   1. Acute plasma cell leukemia -She has started cyborD at [facility] in early Sep 2016 -I spoke with her leukemia program coordinator, and clarified her chemotherapy planning. I'll start her Velcade injection 1.5 mg/m, and dexamethasone 40 mg,  twice a week (on Tuesday and Friday), and IV Cytoxan 300 mg/m once a week (on Friday), for 4 weeks (she had two weeks treatment), then change to CyBorD weekly from cycle 2.  -She will follow up with Dr. 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 -Due to her insurance issue, we start on Friday 9/23.  -continue acyclovir when she is on Velcade -Continue allopurinol -I double checked her home medication, especially dexamethasone, she did not bring her medication bottles today, but seems she is taking the right medication. I asked her to bring the bottles next time. -She has not started omeprazole yet, I asked her to pick  up.  -We'll monitor her blood counts weekly -She has had good response to induction chemotherapy. Her white blood count has come down to normal.  2. Chronic Hepatitis B  -She started on entecavir at [facility], we'll continue. -Her liver function is normal,  3. Fatigue and anorexia  -continue supportive care   4. Normocytic anemia -Likely secondary to her acute plasma cell leukemia -Hemoglobin stable, temporoparietal take, no need for transfusion. -We'll monitor closely.  Plan -Velcade and dexamethasone today and Friday -IV Cytoxan on Friday -I  will see her back in one week   All questions were answered. The patient knows to call the clinic with any problems, questions or concerns. I spent 20 minutes counseling the patient face to face. The total time spent in the appointment was 25 minutes and more than 50% was on counseling.     Truitt Merle, MD 10/31/2014 9:44 AM   Is

## 2014-10-31 NOTE — Progress Notes (Signed)
Port stitches removal, seven stitches out of eight removed, last stitch on Right of port site tight, unable to remove. Pt to return on Friday for cytoxan, will need to have removed then.

## 2014-10-31 NOTE — Patient Instructions (Signed)
[facility] Discharge Instructions for Patients Receiving Chemotherapy  Today you received the following chemotherapy agents velcade.  To help prevent nausea and vomiting after your treatment, we encourage you to take your nausea medication .   If you develop nausea and vomiting that is not controlled by your nausea medication, call the clinic.   BELOW ARE SYMPTOMS THAT SHOULD BE REPORTED IMMEDIATELY:  *FEVER GREATER THAN 100.5 F  *CHILLS WITH OR WITHOUT FEVER  NAUSEA AND VOMITING THAT IS NOT CONTROLLED WITH YOUR NAUSEA MEDICATION  *UNUSUAL SHORTNESS OF BREATH  *UNUSUAL BRUISING OR BLEEDING  TENDERNESS IN MOUTH AND THROAT WITH OR WITHOUT PRESENCE OF ULCERS  *URINARY PROBLEMS  *BOWEL PROBLEMS  UNUSUAL RASH Items with * indicate a potential emergency and should be followed up as soon as possible.  Feel free to call the clinic you have any questions or concerns. The clinic phone number is [phone].  Please show the CHEMO ALERT CARD at check-in to the Emergency Department and triage nurse.   

## 2014-11-02 ENCOUNTER — Ambulatory Visit: Payer: 59

## 2014-11-02 ENCOUNTER — Ambulatory Visit: Payer: 59 | Admitting: Hematology

## 2014-11-02 ENCOUNTER — Other Ambulatory Visit: Payer: 59

## 2014-11-03 ENCOUNTER — Ambulatory Visit (HOSPITAL_BASED_OUTPATIENT_CLINIC_OR_DEPARTMENT_OTHER): Payer: 59

## 2014-11-03 ENCOUNTER — Other Ambulatory Visit (HOSPITAL_BASED_OUTPATIENT_CLINIC_OR_DEPARTMENT_OTHER): Payer: 59

## 2014-11-03 VITALS — BP 99/59 | HR 96 | Temp 98.1°F | Resp 18

## 2014-11-03 DIAGNOSIS — C9010 Plasma cell leukemia not having achieved remission: Secondary | ICD-10-CM | POA: Diagnosis not present

## 2014-11-03 DIAGNOSIS — Z5112 Encounter for antineoplastic immunotherapy: Secondary | ICD-10-CM

## 2014-11-03 DIAGNOSIS — Z5111 Encounter for antineoplastic chemotherapy: Secondary | ICD-10-CM | POA: Diagnosis not present

## 2014-11-03 LAB — COMPREHENSIVE METABOLIC PANEL (CC13)
ALBUMIN: 2.8 g/dL — AB (ref 3.5–5.0)
ALK PHOS: 160 U/L — AB (ref 40–150)
ALT: 14 U/L (ref 0–55)
ANION GAP: 7 meq/L (ref 3–11)
AST: 30 U/L (ref 5–34)
BUN: 12.6 mg/dL (ref 7.0–26.0)
CALCIUM: 9.3 mg/dL (ref 8.4–10.4)
CHLORIDE: 106 meq/L (ref 98–109)
CO2: 23 mEq/L (ref 22–29)
CREATININE: 0.7 mg/dL (ref 0.6–1.1)
EGFR: >90 mL/min/{1.73_m2} (ref >90)
Glucose: 120 mg/dl (ref 70–140)
Potassium: 3.8 mEq/L (ref 3.5–5.1)
Sodium: 136 mEq/L (ref 136–145)
Total Protein: 9 g/dL — ABNORMAL HIGH (ref 6.4–8.3)

## 2014-11-03 LAB — CBC WITH DIFFERENTIAL/PLATELET
BASO%: 0.1 % (ref 0.0–2.0)
Basophils Absolute: 0 10*3/uL (ref 0.0–0.1)
EOS ABS: 0 10*3/uL (ref 0.0–0.5)
EOS%: 0.2 % (ref 0.0–7.0)
HEMATOCRIT: 33.2 % — AB (ref 34.8–46.6)
HEMOGLOBIN: 10.3 g/dL — AB (ref 11.6–15.9)
LYMPH#: 1.9 10*3/uL (ref 0.9–3.3)
LYMPH%: 22.2 % (ref 14.0–49.7)
MCH: 29.4 pg (ref 25.1–34.0)
MCHC: 31 g/dL — ABNORMAL LOW (ref 31.5–36.0)
MCV: 94.9 fL (ref 79.5–101.0)
MONO#: 1 10*3/uL — AB (ref 0.1–0.9)
MONO%: 11.6 % (ref 0.0–14.0)
NEUT%: 65.9 % (ref 38.4–76.8)
NEUTROS ABS: 5.6 10*3/uL (ref 1.5–6.5)
PLATELETS: 225 10*3/uL (ref 145–400)
RBC: 3.5 10*6/uL — AB (ref 3.70–5.45)
RDW: 19 % — AB (ref 11.2–14.5)
WBC: 8.5 10*3/uL (ref 3.9–10.3)

## 2014-11-03 LAB — MAGNESIUM (CC13): Magnesium: 1.7 mg/dl (ref 1.5–2.5)

## 2014-11-03 MED ORDER — SODIUM CHLORIDE 0.9 % IV SOLN
300.0000 mg/m2 | Freq: Once | INTRAVENOUS | Status: AC
  Administered 2014-11-03: 480 mg via INTRAVENOUS
  Filled 2014-11-03: qty 24

## 2014-11-03 MED ORDER — BORTEZOMIB CHEMO SQ INJECTION 3.5 MG (2.5MG/ML)
1.5000 mg/m2 | Freq: Once | INTRAMUSCULAR | Status: AC
  Administered 2014-11-03: 2.5 mg via SUBCUTANEOUS
  Filled 2014-11-03: qty 2.5

## 2014-11-03 MED ORDER — HEPARIN SOD (PORK) LOCK FLUSH 100 UNIT/ML IV SOLN
500.0000 [IU] | Freq: Once | INTRAVENOUS | Status: AC
  Administered 2014-11-03: 500 [IU] via INTRAVENOUS
  Filled 2014-11-03: qty 5

## 2014-11-03 MED ORDER — SODIUM CHLORIDE 0.9 % IV SOLN
Freq: Once | INTRAVENOUS | Status: AC
  Administered 2014-11-03: 10:00:00 via INTRAVENOUS
  Filled 2014-11-03: qty 4

## 2014-11-03 MED ORDER — SODIUM CHLORIDE 0.9 % IJ SOLN
10.0000 mL | INTRAMUSCULAR | Status: DC | PRN
  Administered 2014-11-03: 10 mL via INTRAVENOUS
  Filled 2014-11-03: qty 10

## 2014-11-03 MED ORDER — SODIUM CHLORIDE 0.9 % IV SOLN
Freq: Once | INTRAVENOUS | Status: AC
  Administered 2014-11-03: 10:00:00 via INTRAVENOUS

## 2014-11-03 NOTE — Progress Notes (Signed)
Patient complains of a productive cough since last night. Patient reports a small amount of sputum which is yellow in color. Patient denies fever, or chills. Dr. Burr Medico notified.  Patient ok to treat per Dr. Burr Medico.  Patient took steroids at home.

## 2014-11-03 NOTE — Patient Instructions (Signed)
[facility] Discharge Instructions for Patients Receiving Chemotherapy  Today you received the following chemotherapy agents Cytoxan/Velcade  To help prevent nausea and vomiting after your treatment, we encourage you to take your nausea medication as prescribed.   If you develop nausea and vomiting that is not controlled by your nausea medication, call the clinic.   BELOW ARE SYMPTOMS THAT SHOULD BE REPORTED IMMEDIATELY:  *FEVER GREATER THAN 100.5 F  *CHILLS WITH OR WITHOUT FEVER  NAUSEA AND VOMITING THAT IS NOT CONTROLLED WITH YOUR NAUSEA MEDICATION  *UNUSUAL SHORTNESS OF BREATH  *UNUSUAL BRUISING OR BLEEDING  TENDERNESS IN MOUTH AND THROAT WITH OR WITHOUT PRESENCE OF ULCERS  *URINARY PROBLEMS  *BOWEL PROBLEMS  UNUSUAL RASH Items with * indicate a potential emergency and should be followed up as soon as possible.  Feel free to call the clinic you have any questions or concerns. The clinic phone number is [phone].  Please show the CHEMO ALERT CARD at check-in to the Emergency Department and triage nurse.   

## 2014-11-06 ENCOUNTER — Other Ambulatory Visit: Payer: 59

## 2014-11-06 ENCOUNTER — Ambulatory Visit: Payer: 59 | Admitting: Hematology

## 2014-11-07 ENCOUNTER — Other Ambulatory Visit: Payer: 59

## 2014-11-07 ENCOUNTER — Ambulatory Visit: Payer: 59

## 2014-11-07 ENCOUNTER — Ambulatory Visit (HOSPITAL_BASED_OUTPATIENT_CLINIC_OR_DEPARTMENT_OTHER): Payer: 59

## 2014-11-07 ENCOUNTER — Ambulatory Visit (HOSPITAL_BASED_OUTPATIENT_CLINIC_OR_DEPARTMENT_OTHER): Payer: 59 | Admitting: Hematology

## 2014-11-07 ENCOUNTER — Encounter: Payer: Self-pay | Admitting: Hematology

## 2014-11-07 ENCOUNTER — Other Ambulatory Visit (HOSPITAL_BASED_OUTPATIENT_CLINIC_OR_DEPARTMENT_OTHER): Payer: 59

## 2014-11-07 VITALS — BP 97/67 | HR 106 | Temp 98.3°F | Resp 18 | Ht 62.0 in | Wt 127.1 lb

## 2014-11-07 DIAGNOSIS — D649 Anemia, unspecified: Secondary | ICD-10-CM | POA: Diagnosis not present

## 2014-11-07 DIAGNOSIS — B181 Chronic viral hepatitis B without delta-agent: Secondary | ICD-10-CM | POA: Diagnosis not present

## 2014-11-07 DIAGNOSIS — K5900 Constipation, unspecified: Secondary | ICD-10-CM

## 2014-11-07 DIAGNOSIS — Z5112 Encounter for antineoplastic immunotherapy: Secondary | ICD-10-CM

## 2014-11-07 DIAGNOSIS — C9010 Plasma cell leukemia not having achieved remission: Secondary | ICD-10-CM

## 2014-11-07 DIAGNOSIS — R630 Anorexia: Secondary | ICD-10-CM

## 2014-11-07 LAB — COMPREHENSIVE METABOLIC PANEL (CC13)
ALT: 28 U/L (ref 0–55)
AST: 46 U/L — AB (ref 5–34)
Albumin: 2.7 g/dL — ABNORMAL LOW (ref 3.5–5.0)
Alkaline Phosphatase: 162 U/L — ABNORMAL HIGH (ref 40–150)
Anion Gap: 5 mEq/L (ref 3–11)
BUN: 11.6 mg/dL (ref 7.0–26.0)
CHLORIDE: 106 meq/L (ref 98–109)
CO2: 26 meq/L (ref 22–29)
CREATININE: 0.6 mg/dL (ref 0.6–1.1)
Calcium: 9 mg/dL (ref 8.4–10.4)
EGFR: >90 mL/min/{1.73_m2} (ref >90)
GLUCOSE: 105 mg/dL (ref 70–140)
Potassium: 4.1 mEq/L (ref 3.5–5.1)
Sodium: 137 mEq/L (ref 136–145)
Total Bilirubin: 0.37 mg/dL (ref 0.20–1.20)
Total Protein: 7.8 g/dL (ref 6.4–8.3)

## 2014-11-07 LAB — CBC WITH DIFFERENTIAL/PLATELET
BASO%: 0.2 % (ref 0.0–2.0)
Basophils Absolute: 0 10*3/uL (ref 0.0–0.1)
EOS%: 0 % (ref 0.0–7.0)
Eosinophils Absolute: 0 10*3/uL (ref 0.0–0.5)
HCT: 34.7 % — ABNORMAL LOW (ref 34.8–46.6)
HGB: 11.1 g/dL — ABNORMAL LOW (ref 11.6–15.9)
LYMPH%: 26.4 % (ref 14.0–49.7)
MCH: 30.1 pg (ref 25.1–34.0)
MCHC: 32 g/dL (ref 31.5–36.0)
MCV: 94 fL (ref 79.5–101.0)
MONO#: 0.5 10*3/uL (ref 0.1–0.9)
MONO%: 9.3 % (ref 0.0–14.0)
NEUT%: 64.1 % (ref 38.4–76.8)
NEUTROS ABS: 3.3 10*3/uL (ref 1.5–6.5)
Platelets: 131 10*3/uL — ABNORMAL LOW (ref 145–400)
RBC: 3.69 10*6/uL — AB (ref 3.70–5.45)
RDW: 18.8 % — ABNORMAL HIGH (ref 11.2–14.5)
WBC: 5.1 10*3/uL (ref 3.9–10.3)
lymph#: 1.3 10*3/uL (ref 0.9–3.3)

## 2014-11-07 LAB — TECHNOLOGIST REVIEW

## 2014-11-07 LAB — MAGNESIUM (CC13): Magnesium: 1.7 mg/dl (ref 1.5–2.5)

## 2014-11-07 LAB — IMMUNOFIXATION ELECTROPHORESIS
IGA: 32 mg/dL — AB (ref 69–380)
IGM, SERUM: 14 mg/dL — AB (ref 52–322)
IgG (Immunoglobin G), Serum: 3150 mg/dL — ABNORMAL HIGH (ref 690–1700)
TOTAL PROTEIN, SERUM ELECTROPHOR: 8.1 g/dL (ref 6.0–8.3)

## 2014-11-07 LAB — KAPPA/LAMBDA LIGHT CHAINS
Kappa free light chain: 0.75 mg/dL (ref 0.33–1.94)
Kappa:Lambda Ratio: 0 — ABNORMAL LOW (ref 0.26–1.65)
LAMBDA FREE LGHT CHN: 152 mg/dL — AB (ref 0.57–2.63)

## 2014-11-07 MED ORDER — ONDANSETRON HCL 8 MG PO TABS
ORAL_TABLET | ORAL | Status: AC
  Filled 2014-11-07: qty 1

## 2014-11-07 MED ORDER — BORTEZOMIB CHEMO SQ INJECTION 3.5 MG (2.5MG/ML)
1.5000 mg/m2 | Freq: Once | INTRAMUSCULAR | Status: AC
  Administered 2014-11-07: 2.5 mg via SUBCUTANEOUS
  Filled 2014-11-07: qty 2.5

## 2014-11-07 MED ORDER — DEXAMETHASONE 4 MG PO TABS: ORAL_TABLET | ORAL | Status: DC

## 2014-11-07 MED ORDER — ONDANSETRON HCL 8 MG PO TABS
8.0000 mg | ORAL_TABLET | Freq: Once | ORAL | Status: AC
  Administered 2014-11-07: 8 mg via ORAL

## 2014-11-07 NOTE — Progress Notes (Signed)
[place]  [phone] [phone]) [phone]  Clinic Follow up Note   Patient Care Team: [name], MD as Referring Physician (Specialist) 11/07/2014    CHIEF COMPLAINTS:  Follow up a cute plasma cell leukemia   HISTORY OF PRESENTING ILLNESS:  Sheryl [name] 44 y.o. female is here because of recently diagnosed plasma cell leukemia. She was recently discharged form [facility] 3 days ago and is here to establish her local oncological care with [place]. She is a [place], does not speak [place], she is accompanied to the clinic by her daughter and interpreter.  She presented to our hospital lth with worsening dyspnea, fatigue, cough, subjective fevers and chills, and was admitted on 09/14/2014. She was found to have allergy WBC 54.1K, hemoglobin 8.1, plt 310, she was treated with symptom management, and was discharged home on 09/17/2014, with a plan to follow-up with hematology. She represents to emergency room on 10/07/2014, was found to have white count of 78K, and worsening anemia with hemoglobin 6. She was seen by my partner Dr. Jonette Eva and plasma cell leukemia was suspected. She was transferred to [facility] for further leukemia work-up and treatment. Bone marrow biopsy was done, which confirmed plasma cell leukemia, and she started chemotherapy with Velcade, Cytoxan and dexamethasone. She received weekly dose twice, last dose on 10/20/2014. She was discharged to home afterwards. She had a mediport placed during her hospitalization.  She has moderate fatigue, low appetie, she lost about 13 lbs in the past few months. She has moderate abdominal pain, 5-6/10, persistent, she does not take any pain meds.   I reviewed her medical records extensively, and discussed her case with Dr. Jorje Guild and [place] program coordinator.  INTERIM HISTORY: Sheryl [name] returns for follow up and Velcade injection. Her appetite went down significantly after her chemotherapy  last Friday, she has been eating very little, able to drink adequately. She feels little warm and night, but has no thermometer at home. She also complains of abdominal bloating, has not had a bowel movement for 1 week, no nausea, her weight is stable. She had a little cough when I saw her last Friday in the infusion room, which resolved over the weekend. No chest pain or dyspnea.  MEDICAL HISTORY:  Past Medical History  Diagnosis Date  . GERD (gastroesophageal reflux disease)   . Hepatitis   . History of positive PPD     DX 2011--  CXR DONE NO EVIDENCE  . Hydronephrosis, right   . Right ureteral stone   . History of ureter stent   . Chills with fever     intermittently since d/c from hospital  . Neuromuscular disorder (HCC)     legs numb intermittently  . Dysuria-frequency syndrome     w/ pink urine  . Urosepsis 8/14    admitted to [facility]  . Pneumonia     SURGICAL HISTORY: Past Surgical History  Procedure Laterality Date  . Cystoscopy w/ ureteral stent placement Right 09/25/2012    Procedure: CYSTOSCOPY WITH RETROGRADE PYELOGRAM/URETERAL STENT PLACEMENT;  Surgeon: Alexis Frock, MD;  Location: [facility];  Service: Urology;  Laterality: Right;  . Liver biopsy    . Right vats w/ drainage peural effusion and bx's  10-30-2008  . Removal of uterine cyst      years ago  . Other surgical history Right     removal of ovarian cyst  . Cystoscopy with retrograde pyelogram, ureteroscopy and stent placement Right 10/15/2012    Procedure: CYSTOSCOPY  WITH RETROGRADE PYELOGRAM, URETEROSCOPY AND REMOVAL STENT WITH  STENT PLACEMENT;  Surgeon: Alexis Frock, MD;  Location: [facility];  Service: Urology;  Laterality: Right;  . Holmium laser application Right 0/11/2723    Procedure: HOLMIUM LASER APPLICATION;  Surgeon: Alexis Frock, MD;  Location: [facility];  Service: Urology;  Laterality: Right;    SOCIAL HISTORY: Social History   Social History  . Marital  Status: Single    Spouse Name: N/A  . Number of Children: 15   . Years of Education: N/A   Occupational History  . Not on file.   Social History Main Topics  . Smoking status: Never Smoker   . Smokeless tobacco: Not on file  . Alcohol Use: No  . Drug Use: No  . Sexual Activity: Not Currently    Birth Control/ Protection: Abstinence   Other Topics Concern  . Not on file   Social History Narrative    FAMILY HISTORY: Family History  Problem Relation Age of Onset  . Stomach cancer Mother   . Lung disease Father     ALLERGIES:  has No Known Allergies.  MEDICATIONS:  Current Outpatient Prescriptions  Medication Sig Dispense Refill  . acyclovir (ZOVIRAX) 400 MG tablet Take 1 tablet (400 mg total) by mouth 2 (two) times daily. 60 tablet 3  . allopurinol (ZYLOPRIM) 300 MG tablet Take 300 mg by mouth.    . dexamethasone (DECADRON) 4 MG tablet Take 10 tab on the day of Velcade injection (every Tuesday and Thursday until 10/6, then every Thursday) 40 tablet 2  . entecavir (BARACLUDE) 0.5 MG tablet Take 0.5 mg by mouth.    . lidocaine-prilocaine (EMLA) cream Apply 1 application topically as needed. 30 g 6  . omeprazole (PRILOSEC) 20 MG capsule Take 1 capsule (20 mg total) by mouth daily. 30 capsule 2   No current facility-administered medications for this visit.    REVIEW OF SYSTEMS:   Constitutional: Denies fevers, chills or abnormal night sweats Eyes: Denies blurriness of vision, double vision or watery eyes Ears, nose, mouth, throat, and face: Denies mucositis or sore throat Respiratory: Denies cough, dyspnea or wheezes Cardiovascular: Denies palpitation, chest discomfort or lower extremity swelling Gastrointestinal:  Denies nausea, heartburn or change in bowel habits Skin: Denies abnormal skin rashes Lymphatics: Denies new lymphadenopathy or easy bruising Neurological:Denies numbness, tingling or new weaknesses Behavioral/Psych: Mood is stable, no new changes  All other  systems were reviewed with the patient and are negative.  PHYSICAL EXAMINATION: ECOG PERFORMANCE STATUS: 2 - Symptomatic, <50% confined to bed  Filed Vitals:   11/07/14 1010  BP: 97/67  Pulse: 106  Temp: 98.3 F (36.8 C)  Resp: 18   Filed Weights   11/07/14 1010  Weight: 127 lb 1.6 oz (57.652 kg)    GENERAL:alert, no distress and comfortable SKIN: skin color, texture, turgor are normal, no rashes or significant lesions EYES: normal, conjunctiva are pink and non-injected, sclera clear OROPHARYNX:no exudate, no erythema and lips, buccal mucosa, and tongue normal  NECK: supple, thyroid normal size, non-tender, without nodularity LYMPH:  no palpable lymphadenopathy in the cervical, axillary or inguinal LUNGS: clear to auscultation and percussion with normal breathing effort HEART: regular rate & rhythm and no murmurs and no lower extremity edema ABDOMEN:abdomen soft, non-tender and normal bowel sounds Musculoskeletal:no cyanosis of digits and no clubbing  PSYCH: alert & oriented x 3 with fluent speech NEURO: no focal motor/sensory deficits  LABORATORY DATA:  I have reviewed the data as  listed CBC Latest Ref Rng 11/07/2014 11/03/2014 10/31/2014  WBC 3.9 - 10.3 10e3/uL 5.1 8.5 7.7  Hemoglobin 11.6 - 15.9 g/dL 11.1(L) 10.3(L) 10.5(L)  Hematocrit 34.8 - 46.6 % 34.7(L) 33.2(L) 31.8(L)  Platelets 145 - 400 10e3/uL 131(L) 225 313    CMP Latest Ref Rng 11/07/2014 11/03/2014 10/31/2014  Glucose 70 - 140 mg/dl 105 120 134  BUN 7.0 - 26.0 mg/dL 11.6 12.6 9.4  Creatinine 0.6 - 1.1 mg/dL 0.6 0.7 0.7  Sodium 136 - 145 mEq/L 137 136 134(L)  Potassium 3.5 - 5.1 mEq/L 4.1 3.8 4.0  Chloride 101 - 111 mmol/L - - -  CO2 22 - 29 mEq/L _0 Calcium 8.4 - 10.4 mg/dL 9.0 9.3 9.0  Total Protein 6.4 - 8.3 g/dL 7.8 9.0(H) 9.1(H)  Total Bilirubin 0.20 - 1.20 mg/dL 0.37 <0.30 Repeated and Verified 0.32  Alkaline Phos 40 - 150 U/L 162(H) 160(H) 154(H)  AST 5 - 34 U/L 46(H) 30 26  ALT 0 - 55 U/L _1 SPEP M-protein  09/15/2014: 4.2 10/08/14: 4.6     PATHOLOGY REPORT  Bone Marrow (BM) and Peripheral Blood (PB) 10/10/2014 [facility])  FINAL PATHOLOGIC DIAGNOSIS      BONE MARROW AND PERIPHERAL BLOOD:  Extensive involvement by plasma cell myeloma/leukemia.  See comment and CBC data.      COMMENT:  The bone marrow aspirate smears are spicular, cellular and  adequate for evaluation. Atypical plasma cells comprise 57% of  the cellularity (500 cell manual differential count). The plasma  cells have enlarged nuclei with scattered binucleated forms and  irregular nuclear contour. There is diminished multilineage  hematopoiesis with adequate maturation. Blasts are not increased  in numbers (less than 1% by manual differential counts). 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 There is no overt dysplasia of the  myeloid or erythroid lineages. Megakaryocytes are quantitatively  and qualitatively unremarkable. There is no lymphocytosis.    Examination of the bone marrow core biopsy reveals a markedly  hypercellular marrow for age (95%) with near complete replacement  by sheets of plasma cells. Rare myeloid and erythroid elements  are present. Megakaryocytes are sparse and focally clustered with  hypolobated and hyperchromatic nuclei. There are no atypical  lymphoid aggregates or granulomas identified. Examination of the  bone marrow clot section reveals similar findings.    The peripheral blood demonstrates a leukocytosis comprised of  large plasma cells and normocytic anemia. Rouleaux is present.  There is a myeloid left shift with rare circulating blasts.    By flow cytometry on the peripheral blood, the abnormal plasma  cell population comprises 64% of the cellular elements and  expresses cytoplasmic CD138, CD45 and cLambda with dim expression  of CD19. The plasma  cells are negative for surface CD2, CD3, CD4,  CD5, CD7, CD8, CD56, CD10, CD20, sKappa, sLambda, HLA-DR and  cKappa.    Overall, the findings demonstrate extensive involvement by plasma  cell myeloma/leukemia.     RADIOGRAPHIC STUDIES: I have personally reviewed the radiological images as listed and agreed with the findings in the report. No results found.  ASSESSMENT & PLAN: 44 year old [place], presented with anemia and leokocytosis   1. Acute plasma cell leukemia -She has started cyborD at [facility] in early Sep 2016 -I spoke with her leukemia program coordinator, and clarified her chemotherapy planning. I'll start her Velcade injection 1.5 mg/m, and dexamethasone 40 mg,  twice a week (on Tuesday and Friday), and IV Cytoxan 300 mg/m once a week (on Friday), for 4 weeks (she had two weeks treatment), then change to CyBorD weekly from cycle 2.  -She will follow up with Dr. 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 -Due to her insurance issue, we start on Friday 9/23.  -continue acyclovir when she is on Velcade -Continue allopurinol -She has had good response to induction chemotherapy. Her white blood count has come down to normal. -Lab reviewed, adequate for treatment, she'll receive Velcade today, Velcade and Cytoxan this Friday.  2. Anorexia, constipation -Likely secondary to chemotherapy -I encouraged her to use nutrition supplement, such as Ensure or boost -Nutrition consult -I told her to use over-the-counter laxatives, such as senna S for constipation -She has some Maalox at home, I told her to okay to use for abdominal bloating.  3. Chronic Hepatitis B  -She started on entecavir at [facility], we'll continue. -Her liver function is normal, she will follow up with [facility]  4. Normocytic anemia -Likely secondary to her acute plasma cell leukemia -Hemoglobin stable, no need for transfusion. -We'll monitor closely.  5.  Communication and Social support -She does not speak [place], she is accompanied by interpreter to our cancer center -She has significant communication barrier, limited social support, and poor health literacy  -I spent a significant amount to review her medications, and how to monitor her symptoms and manage her symptoms at home.   Plan -Velcade and dexamethasone today and Friday -IV Cytoxan on Friday -She is going to see Dr. Norma Fredrickson at [facility] next Monday, I'll see her back next Friday, October 14, to start cycle 2 CyBorD.   All questions were answered. The patient knows to call the clinic with any problems, questions or concerns. I spent 30 minutes counseling the patient face to face. The total time spent in the appointment was 40 minutes and more than 50% was on counseling.     Truitt Merle, MD 11/07/2014 10:40 AM   Is

## 2014-11-07 NOTE — Patient Instructions (Signed)
[facility] Discharge Instructions for Patients Receiving Chemotherapy  Today you received the following chemotherapy agents Velcade. To help prevent nausea and vomiting after your treatment, we encourage you to take your nausea medication as directed.  If you develop nausea and vomiting that is not controlled by your nausea medication, call the clinic.   BELOW ARE SYMPTOMS THAT SHOULD BE REPORTED IMMEDIATELY:  *FEVER GREATER THAN 100.5 F  *CHILLS WITH OR WITHOUT FEVER  NAUSEA AND VOMITING THAT IS NOT CONTROLLED WITH YOUR NAUSEA MEDICATION  *UNUSUAL SHORTNESS OF BREATH  *UNUSUAL BRUISING OR BLEEDING  TENDERNESS IN MOUTH AND THROAT WITH OR WITHOUT PRESENCE OF ULCERS  *URINARY PROBLEMS  *BOWEL PROBLEMS  UNUSUAL RASH Items with * indicate a potential emergency and should be followed up as soon as possible.  Feel free to call the clinic you have any questions or concerns. The clinic phone number is [phone].  Please show the CHEMO ALERT CARD at check-in to the Emergency Department and triage nurse.    

## 2014-11-09 ENCOUNTER — Ambulatory Visit: Payer: 59

## 2014-11-09 ENCOUNTER — Other Ambulatory Visit: Payer: 59

## 2014-11-10 ENCOUNTER — Ambulatory Visit (HOSPITAL_BASED_OUTPATIENT_CLINIC_OR_DEPARTMENT_OTHER): Payer: 59 | Admitting: Nurse Practitioner

## 2014-11-10 ENCOUNTER — Ambulatory Visit: Payer: 59 | Admitting: Nutrition

## 2014-11-10 ENCOUNTER — Other Ambulatory Visit: Payer: Self-pay

## 2014-11-10 ENCOUNTER — Telehealth: Payer: Self-pay

## 2014-11-10 ENCOUNTER — Ambulatory Visit (HOSPITAL_BASED_OUTPATIENT_CLINIC_OR_DEPARTMENT_OTHER): Payer: 59

## 2014-11-10 ENCOUNTER — Ambulatory Visit (HOSPITAL_COMMUNITY)
Admission: RE | Admit: 2014-11-10 | Discharge: 2014-11-10 | Disposition: A | Discharge status: Home / Self Care | Payer: 59 | Source: Ambulatory Visit | Attending: Nurse Practitioner | Admitting: Nurse Practitioner

## 2014-11-10 ENCOUNTER — Other Ambulatory Visit (HOSPITAL_BASED_OUTPATIENT_CLINIC_OR_DEPARTMENT_OTHER): Payer: 59

## 2014-11-10 VITALS — BP 125/74 | HR 88 | Temp 97.4°F | Resp 18

## 2014-11-10 DIAGNOSIS — C9012 Plasma cell leukemia in relapse: Secondary | ICD-10-CM | POA: Insufficient documentation

## 2014-11-10 DIAGNOSIS — C9010 Plasma cell leukemia not having achieved remission: Secondary | ICD-10-CM

## 2014-11-10 DIAGNOSIS — R109 Unspecified abdominal pain: Secondary | ICD-10-CM | POA: Diagnosis not present

## 2014-11-10 DIAGNOSIS — Z5112 Encounter for antineoplastic immunotherapy: Secondary | ICD-10-CM

## 2014-11-10 DIAGNOSIS — R1010 Upper abdominal pain, unspecified: Secondary | ICD-10-CM | POA: Diagnosis not present

## 2014-11-10 DIAGNOSIS — R1013 Epigastric pain: Secondary | ICD-10-CM

## 2014-11-10 LAB — CBC WITH DIFFERENTIAL/PLATELET
BASO%: 0.2 % (ref 0.0–2.0)
BASOS ABS: 0 10*3/uL (ref 0.0–0.1)
EOS ABS: 0 10*3/uL (ref 0.0–0.5)
EOS%: 0 % (ref 0.0–7.0)
HCT: 33.4 % — ABNORMAL LOW (ref 34.8–46.6)
HEMOGLOBIN: 11 g/dL — AB (ref 11.6–15.9)
LYMPH%: 19.8 % (ref 14.0–49.7)
MCH: 30.4 pg (ref 25.1–34.0)
MCHC: 32.8 g/dL (ref 31.5–36.0)
MCV: 92.7 fL (ref 79.5–101.0)
MONO#: 0.7 10*3/uL (ref 0.1–0.9)
MONO%: 12.6 % (ref 0.0–14.0)
NEUT%: 67.4 % (ref 38.4–76.8)
NEUTROS ABS: 4 10*3/uL (ref 1.5–6.5)
PLATELETS: 145 10*3/uL (ref 145–400)
RBC: 3.6 10*6/uL — AB (ref 3.70–5.45)
RDW: 20.7 % — ABNORMAL HIGH (ref 11.2–14.5)
WBC: 5.9 10*3/uL (ref 3.9–10.3)
lymph#: 1.2 10*3/uL (ref 0.9–3.3)

## 2014-11-10 LAB — COMPREHENSIVE METABOLIC PANEL (CC13)
ALBUMIN: 2.8 g/dL — AB (ref 3.5–5.0)
ALK PHOS: 174 U/L — AB (ref 40–150)
ALT: 43 U/L (ref 0–55)
ANION GAP: 6 meq/L (ref 3–11)
AST: 64 U/L — ABNORMAL HIGH (ref 5–34)
BILIRUBIN TOTAL: 0.34 mg/dL (ref 0.20–1.20)
BUN: 11.2 mg/dL (ref 7.0–26.0)
CO2: 26 mEq/L (ref 22–29)
Calcium: 8.8 mg/dL (ref 8.4–10.4)
Chloride: 106 mEq/L (ref 98–109)
Creatinine: 0.6 mg/dL (ref 0.6–1.1)
Glucose: 102 mg/dl (ref 70–140)
Potassium: 3.9 mEq/L (ref 3.5–5.1)
Sodium: 138 mEq/L (ref 136–145)
TOTAL PROTEIN: 7.1 g/dL (ref 6.4–8.3)

## 2014-11-10 LAB — TECHNOLOGIST REVIEW

## 2014-11-10 LAB — MAGNESIUM (CC13): MAGNESIUM: 1.9 mg/dL (ref 1.5–2.5)

## 2014-11-10 MED ORDER — PANTOPRAZOLE SODIUM 40 MG IV SOLR
40.0000 mg | Freq: Once | INTRAVENOUS | Status: AC
  Administered 2014-11-10: 40 mg via INTRAVENOUS
  Filled 2014-11-10: qty 40

## 2014-11-10 MED ORDER — SODIUM CHLORIDE 0.9 % IV SOLN
Freq: Once | INTRAVENOUS | Status: AC
  Administered 2014-11-10: 11:00:00 via INTRAVENOUS
  Filled 2014-11-10: qty 4

## 2014-11-10 MED ORDER — SODIUM CHLORIDE 0.9 % IJ SOLN
10.0000 mL | INTRAMUSCULAR | Status: DC | PRN
  Administered 2014-11-10: 10 mL via INTRAVENOUS
  Filled 2014-11-10: qty 10

## 2014-11-10 MED ORDER — BORTEZOMIB CHEMO SQ INJECTION 3.5 MG (2.5MG/ML)
1.5000 mg/m2 | Freq: Once | INTRAMUSCULAR | Status: AC
  Administered 2014-11-10: 2.5 mg via SUBCUTANEOUS
  Filled 2014-11-10: qty 2.5

## 2014-11-10 MED ORDER — HEPARIN SOD (PORK) LOCK FLUSH 100 UNIT/ML IV SOLN
500.0000 [IU] | Freq: Once | INTRAVENOUS | Status: AC
  Administered 2014-11-10: 500 [IU] via INTRAVENOUS
  Filled 2014-11-10: qty 5

## 2014-11-10 MED ORDER — CYCLOPHOSPHAMIDE CHEMO INJECTION 1 GM
300.0000 mg/m2 | Freq: Once | INTRAMUSCULAR | Status: AC
  Administered 2014-11-10: 480 mg via INTRAVENOUS
  Filled 2014-11-10: qty 24

## 2014-11-10 NOTE — Telephone Encounter (Signed)
-----   Message from Truitt Merle, MD sent at 11/10/2014 11:42 AM EDT ----- Thanks much, appreciate it!  Armend Hochstatter, she does not speak [place] and needs a interpreter, she will be here for another 1-2 hours, if you have her schedule, you may pass it to me and we will let her know.  Thanks.  Krista Blue  ----- Message -----    From: Milus Banister, MD    Sent: 11/10/2014  11:29 AM      To: Marlon Pel, RN, Gatha Mayer, MD, #  Krista Blue, I have office time next Tuesday, can probably scope on Thursday. We'll get in touch to schedule   Barbera Setters, She needs new office visit with me on Tuesday next week (looks like I have spot in AM and PM.  Also tentatively book her for EGD at [facility] [place] on Thursday next week for epigastric pain.  Thanks  dj  ----- Message -----    From: Truitt Merle, MD    Sent: 11/10/2014  11:14 AM      To: Milus Banister, MD, Gatha Mayer, MD, #  Maurice March,  She is a pt with recently diagnosed plasma cell leukemia, on chemotherapy, including high-dose dexamethasone. She has been having worsening epigastric pain for this week, on PPI. She had a history of gastric ulcer before.  Could you or one of your partners see her next week and do a EGD? I need to know if I have to stop her steroids. She is on weekly (friday)  Dexamethasone.  Thanks much,  Krista Blue

## 2014-11-10 NOTE — Telephone Encounter (Signed)
No openings on office schedule.  Discussed with Dr. Ardis Hughs, added to pm on 11/14/11 3:15

## 2014-11-10 NOTE — Progress Notes (Signed)
Pt reports epigastric pain. States it feels like tightness and has been on-going over the past month and worsened yesterday but slightly better today. Dr. Burr Medico aware and increased prilosec to twice a day and wants pt to begin taking Malox at home okay to proceed with treatment per Dr. Burr Medico. Pt aware and verbalizes understanding. Selena Lesser NP and Dr. Burr Medico to infusion to assess pt. Pt is to have Protonix 40mg  IVP today while in infusion and to report to xray for KUB after infusion. Pt aware and verbalizes understanding. Interpreter present at 1215 and Pt in stable condition and d/c with interpreter to Xray.

## 2014-11-10 NOTE — Patient Instructions (Signed)
[facility] Discharge Instructions for Patients Receiving Chemotherapy  Today you received the following chemotherapy agents: Cytoxan and Velcade  To help prevent nausea and vomiting after your treatment, we encourage you to take your nausea medication as directed.    If you develop nausea and vomiting that is not controlled by your nausea medication, call the clinic.   BELOW ARE SYMPTOMS THAT SHOULD BE REPORTED IMMEDIATELY:  *FEVER GREATER THAN 100.5 F  *CHILLS WITH OR WITHOUT FEVER  NAUSEA AND VOMITING THAT IS NOT CONTROLLED WITH YOUR NAUSEA MEDICATION  *UNUSUAL SHORTNESS OF BREATH  *UNUSUAL BRUISING OR BLEEDING  TENDERNESS IN MOUTH AND THROAT WITH OR WITHOUT PRESENCE OF ULCERS  *URINARY PROBLEMS  *BOWEL PROBLEMS  UNUSUAL RASH Items with * indicate a potential emergency and should be followed up as soon as possible.  Feel free to call the clinic you have any questions or concerns. The clinic phone number is [phone].  Please show the CHEMO ALERT CARD at check-in to the Emergency Department and triage nurse.   

## 2014-11-10 NOTE — Telephone Encounter (Signed)
EGD added at [facility] to 11/16/14 12:30

## 2014-11-10 NOTE — Progress Notes (Signed)
44 year old female diagnosed with plasma cell leukemia.  She is a patient of Dr. Burr Medico.  Patient was seen with interpreter.  Past medical history includes GERD and hepatitis.  Medications include Decadron, Prilosec, and Velcade.  Labs include albumin 2.7 on October 4.  Height: 62 inches. Weight: 127.1 pounds. Usual body weight: 130 pounds. BMI: 23.24.  Patient reports she has had nausea but denies vomiting. States her appetite has improved. Reports she had a bowel movement yesterday.  Nutrition diagnosis:  Unintended weight loss related to poor appetite as evidenced by 3 pound weight loss which was unintentional.  Intervention:  Educated patient to consume small frequent meals and snacks consuming high-calorie high-protein foods. Encouraged patient to consume Ensure Plus or boost plus twice a day to 3 times a day as needed. Provided samples and coupons for patient. Questions were answered.  Teach back method used.  Contact information was given.  Monitoring, evaluation, goals: Patient will work to increase calories and protein to minimize weight loss.  Next visit: Will be scheduled as needed  **Disclaimer: This note was dictated with voice recognition software. Similar sounding words can inadvertently be transcribed and this note may contain transcription errors which may not have been corrected upon publication of note.**

## 2014-11-12 ENCOUNTER — Encounter: Payer: Self-pay | Admitting: Nurse Practitioner

## 2014-11-12 NOTE — Assessment & Plan Note (Signed)
Reports slight increased upper abd pain recently. Denies any nausea or vomiting. States that she has no diarrhea or constipation. Last normal BM was yesterday. Denies any fever or chills.   On exam- mild tenderness to entire upper abd with palp; but no rebound tenderness. Bowel sounds positive; and abd soft.   KUB x-ray obtained today was negative for an acute abdomen.  It did reveal some mild constipation however.  Patient received Protonix 40 mg IV while the cancer Center.  She was also advised to increase the Prilosec to twice daily and try Maalox at home as well.  Patient reports having a history of ulcer in the past.  Dr. Burr Medico contacted Dr. [facility]- and pt will follow up with Dr. Ardis Hughs and obtain an EGD for further eval as well this coming week.   Pt was advised to call/return or go directly to the Ed for any worsening symptoms whatsoever.   *Interpreter present for entire visit.

## 2014-11-12 NOTE — Progress Notes (Signed)
SYMPTOM MANAGEMENT CLINIC   HPI: Sheryl [name] 44 y.o. 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  Currently undergoing Cytoxan/Velcade/dexamethasone chemotherapy regimen.   Reports slight increased upper abd pain recently. Denies any nausea or vomiting. States that she has no diarrhea or constipation. Last normal BM was yesterday. Denies any fever or chills.   On exam- mild tenderness to entire upper abd with palp; but no rebound tenderness. Bowel sounds positive; and abd soft.   KUB x-ray obtained today was negative for an acute abdomen.  It did reveal some mild constipation however.  Patient received Protonix 40 mg IV while the cancer Center.  She was also advised to increase the Prilosec to twice daily and try Maalox at home as well.  Patient reports having a history of ulcer in the past.  Dr. Burr Medico contacted Dr. [facility]- and pt will follow up with Dr. Ardis Hughs and obtain an EGD for further eval as well this coming week.   Pt was advised to call/return or go directly to the Ed for any worsening symptoms whatsoever.   *Interpreter present for entire visit.   HPI  ROS  Past Medical History  Diagnosis Date  . GERD (gastroesophageal reflux disease)   . Hepatitis   . History of positive PPD     DX 2011--  CXR DONE NO EVIDENCE  . Hydronephrosis, right   . Right ureteral stone   . History of ureter stent   . Chills with fever     intermittently since d/c from hospital  . Neuromuscular disorder (HCC)     legs numb intermittently  . Dysuria-frequency syndrome     w/ pink urine  . Urosepsis 8/14    admitted to [facility]  . Pneumonia     Past Surgical History  Procedure Laterality Date  . Cystoscopy w/ ureteral stent placement Right 09/25/2012    Procedure: CYSTOSCOPY WITH RETROGRADE PYELOGRAM/URETERAL STENT PLACEMENT;  Surgeon: Alexis Frock, MD;  Location: [facility];  Service: Urology;  Laterality: Right;  . Liver biopsy    . Right vats w/ drainage peural effusion and bx's   10-30-2008  . Removal of uterine cyst      years ago  . Other surgical history Right     removal of ovarian cyst  . Cystoscopy with retrograde pyelogram, ureteroscopy and stent placement Right 10/15/2012    Procedure: CYSTOSCOPY WITH RETROGRADE PYELOGRAM, URETEROSCOPY AND REMOVAL STENT WITH  STENT PLACEMENT;  Surgeon: Alexis Frock, MD;  Location: [facility];  Service: Urology;  Laterality: Right;  . Holmium laser application Right 08/03/1599    Procedure: HOLMIUM LASER APPLICATION;  Surgeon: Alexis Frock, MD;  Location: [facility];  Service: Urology;  Laterality: Right;    has Leukocytosis; Nausea & vomiting; Abdominal pain; Abnormal LFTs; GERD (gastroesophageal reflux disease); Hepatitis B; Bilateral leg numbness; Gastroesophageal reflux disease without esophagitis; Normocytic anemia; Constipation; GIB (gastrointestinal bleeding); Healthcare-associated pneumonia; Hepatitis; and 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[place]) on her problem list.    has No Known Allergies.    Medication List       This list is accurate as of: 11/10/14 11:59 PM.  Always use your most recent med list.               acyclovir 400 MG tablet  Commonly known as:  ZOVIRAX  Take 1 tablet (400 mg total) by mouth 2 (two) times daily.     allopurinol 300 MG tablet  Commonly known as:  ZYLOPRIM  Take 300 mg  by mouth.     dexamethasone 4 MG tablet  Commonly known as:  DECADRON  Take 10 tab on the day of Velcade injection (every Tuesday and Thursday until 10/6, then every Thursday)     entecavir 0.5 MG tablet  Commonly known as:  BARACLUDE  Take 0.5 mg by mouth.     lidocaine-prilocaine cream  Commonly known as:  EMLA  Apply 1 application topically as needed.     omeprazole 20 MG capsule  Commonly known as:  PRILOSEC  Take 1 capsule (20 mg total) by mouth daily.         PHYSICAL EXAMINATION  Oncology Vitals 11/10/2014 11/07/2014 11/03/2014 11/03/2014 10/31/2014 10/27/2014 10/24/2014    Height - 158 cm - - 158 cm - 158 cm  Weight - 57.652 kg - - 57.335 kg - 57.788 kg  Weight (lbs) - 127 lbs 2 oz - - 126 lbs 6 oz - 127 lbs 6 oz  BMI (kg/m2) - 23.25 kg/m2 - - 23.12 kg/m2 - 23.3 kg/m2  Temp 97.4 98.3 - 98.1 98.3 98.1 98.5  Pulse 88 106 96 106 107 89 96  Resp 18 18 - _0 SpO2 100 100 - 99 100 100 100  BSA (m2) - 1.59 m2 - - 1.58 m2 - 1.59 m2   BP Readings from Last 3 Encounters:  11/10/14 125/74  11/07/14 97/67  11/03/14 99/59    Physical Exam  Constitutional: She is oriented to person, place, and time.  Appears fatigued, frail, chronically ill.   HENT:  Head: Normocephalic and atraumatic.  Eyes: Conjunctivae and EOM are normal. Pupils are equal, round, and reactive to light. Right eye exhibits no discharge. Left eye exhibits no discharge. No scleral icterus.  Neck: Normal range of motion. Neck supple. No JVD present. No tracheal deviation present. No thyromegaly present.  Cardiovascular: Normal rate, regular rhythm, normal heart sounds and intact distal pulses.   Pulmonary/Chest: Breath sounds normal. No respiratory distress. She has no wheezes. She has no rales. She exhibits no tenderness.  Abdominal: Soft. Bowel sounds are normal. She exhibits no distension and no mass. There is tenderness. There is no rebound and no guarding.  Mild tenderness to upper abd with palp.   Musculoskeletal: Normal range of motion. She exhibits no edema or tenderness.  Lymphadenopathy:    She has no cervical adenopathy.  Neurological: She is alert and oriented to person, place, and time.  Skin: Skin is warm and dry. No rash noted. No erythema. There is pallor.  Psychiatric: Affect normal.  Nursing note and vitals reviewed.   LABORATORY DATA:. Appointment on 11/10/2014  Component Date Value Ref Range Status  . WBC 11/10/2014 5.9  3.9 - 10.3 10e3/uL Final  . NEUT# 11/10/2014 4.0  1.5 - 6.5 10e3/uL Final  . HGB 11/10/2014 11.0* 11.6 - 15.9 g/dL Final  . HCT 11/10/2014  33.4* 34.8 - 46.6 % Final  . Platelets 11/10/2014 145  145 - 400 10e3/uL Final  . MCV 11/10/2014 92.7  79.5 - 101.0 fL Final  . MCH 11/10/2014 30.4  25.1 - 34.0 pg Final  . MCHC 11/10/2014 32.8  31.5 - 36.0 g/dL Final  . RBC 11/10/2014 3.60* 3.70 - 5.45 10e6/uL Final  . RDW 11/10/2014 20.7* 11.2 - 14.5 % Final  . lymph# 11/10/2014 1.2  0.9 - 3.3 10e3/uL Final  . MONO# 11/10/2014 0.7  0.1 - 0.9 10e3/uL Final  . Eosinophils Absolute 11/10/2014 0.0  0.0 - 0.5 10e3/uL Final  .  Basophils Absolute 11/10/2014 0.0  0.0 - 0.1 10e3/uL Final  . NEUT% 11/10/2014 67.4  38.4 - 76.8 % Final  . LYMPH% 11/10/2014 19.8  14.0 - 49.7 % Final  . MONO% 11/10/2014 12.6  0.0 - 14.0 % Final  . EOS% 11/10/2014 0.0  0.0 - 7.0 % Final  . BASO% 11/10/2014 0.2  0.0 - 2.0 % Final  . Sodium 11/10/2014 138  136 - 145 mEq/L Final  . Potassium 11/10/2014 3.9  3.5 - 5.1 mEq/L Final  . Chloride 11/10/2014 106  98 - 109 mEq/L Final  . CO2 11/10/2014 26  22 - 29 mEq/L Final  . Glucose 11/10/2014 102  70 - 140 mg/dl Final   Glucose reference range is for nonfasting patients. Fasting glucose reference range is 70- 100.  Marland Kitchen BUN 11/10/2014 11.2  7.0 - 26.0 mg/dL Final  . Creatinine 11/10/2014 0.6  0.6 - 1.1 mg/dL Final  . Total Bilirubin 11/10/2014 0.34  0.20 - 1.20 mg/dL Final  . Alkaline Phosphatase 11/10/2014 174* 40 - 150 U/L Final  . AST 11/10/2014 64* 5 - 34 U/L Final  . ALT 11/10/2014 43  0 - 55 U/L Final  . Total Protein 11/10/2014 7.1  6.4 - 8.3 g/dL Final  . Albumin 11/10/2014 2.8* 3.5 - 5.0 g/dL Final  . Calcium 11/10/2014 8.8  8.4 - 10.4 mg/dL Final  . Anion Gap 11/10/2014 6  3 - 11 mEq/L Final  . EGFR 11/10/2014 >90  >90 ml/min/1.73 m2 Final   eGFR is calculated using the CKD-EPI Creatinine Equation (2009)  . Magnesium 11/10/2014 1.9  1.5 - 2.5 mg/dl Final  . Technologist Review 11/10/2014 few myelocytes   Final     RADIOGRAPHIC STUDIES: Dg Abd 1 View  11/10/2014   CLINICAL DATA:  Abdominal pain, history of  plasma cell leukemia  EXAM: ABDOMEN - 1 VIEW  COMPARISON:  None.  FINDINGS: Scattered large and small bowel gas is noted. Fecal material is noted throughout the colon consistent with a mild degree of constipation. No obstructive changes are seen. No bony abnormality is noted. Multiple injection granulomas are seen. No free air is noted.  IMPRESSION: Nonspecific abdomen.   Electronically Signed   By: Inez Catalina M.D.   On: 11/10/2014 12:56    ASSESSMENT/PLAN:    Abdominal pain Reports slight increased upper abd pain recently. Denies any nausea or vomiting. States that she has no diarrhea or constipation. Last normal BM was yesterday. Denies any fever or chills.   On exam- mild tenderness to entire upper abd with palp; but no rebound tenderness. Bowel sounds positive; and abd soft.   KUB x-ray obtained today was negative for an acute abdomen.  It did reveal some mild constipation however.  Patient received Protonix 40 mg IV while the cancer Center.  She was also advised to increase the Prilosec to twice daily and try Maalox at home as well.  Patient reports having a history of ulcer in the past.  Dr. Burr Medico contacted Dr. [facility]- and pt will follow up with Dr. Ardis Hughs and obtain an EGD for further eval as well this coming week.   Pt was advised to call/return or go directly to the Ed for any worsening symptoms whatsoever.   *Interpreter present for entire visit.     Patient stated understanding of all instructions; and was in agreement with this plan of care. The patient knows to call the clinic with any problems, questions or concerns.   This was a  shared visit with Dr. Burr Medico.   Total time spent with patient was 40 minutes;  with greater than 75 percent of that time spent in face to face counseling regarding patient's symptoms,  and coordination of care and follow up.  Disclaimer:This dictation was prepared with Dragon/digital dictation along with [employer]. Any  transcriptional errors that result from this process are unintentional.  Drue Second, NP 11/12/2014   Attending addendum I have seen the patient, examined her. I agree with the assessment and and plan and have edited the notes.   She has worsening epigastric discomfort, I suspect gastritis/ulcer related to her steroids. I told her to increase Prilosec to twice daily. I have arranged GI referral and EGD next week.   She will follow-up with Dr. Norma Fredrickson at [facility] next Monday, I communicated her status with Dr. Norma Fredrickson.  I'll see her back next Friday for cycle 2 chemotherapy.  Truitt Merle

## 2014-11-13 LAB — UPEP/TP, 24-HR URINE
COLLECTION INTERVAL: 24 h
TOTAL PROTEIN, URINE/DAY: 96 mg/d (ref 50–100)
TOTAL PROTEIN, URINE: 6 mg/dL
Total Volume, Urine: 1600 mL

## 2014-11-13 LAB — UIFE/LIGHT CHAINS/TP QN, 24-HR UR
ALBUMIN, U: DETECTED
ALPHA 1 UR: DETECTED — AB
ALPHA 2 UR: DETECTED — AB
Beta, Urine: DETECTED — AB
Gamma Globulin, Urine: DETECTED — AB
TIME-UPE24: 24 h
TOTAL PROTEIN, URINE-UPE24: 6 mg/dL (ref 5–24)
VOLUME, URINE-UPE24: 1600 mL

## 2014-11-13 LAB — 24 HR URINE,KAPPA/LAMBDA LIGHT CHAINS
Measured Lambda Chain: <0.4 mg/dL (ref <2.00)
URINE VOLUME: 1600 mL/(24.h)

## 2014-11-14 ENCOUNTER — Ambulatory Visit (INDEPENDENT_AMBULATORY_CARE_PROVIDER_SITE_OTHER): Payer: 59 | Admitting: Gastroenterology

## 2014-11-14 ENCOUNTER — Encounter: Payer: Self-pay | Admitting: Gastroenterology

## 2014-11-14 VITALS — BP 100/66 | HR 84 | Ht 62.25 in | Wt 130.2 lb

## 2014-11-14 DIAGNOSIS — R1013 Epigastric pain: Secondary | ICD-10-CM | POA: Diagnosis not present

## 2014-11-14 NOTE — Patient Instructions (Signed)
You will be set up for an upper endoscopy at [facility] on THursday.

## 2014-11-14 NOTE — Progress Notes (Signed)
sbe/s HPI: This is a  very pleasant Vietnamese speaking 44 year old woman   who was referred to me by Dr./ Burr Medico  to evaluate  epigastric pain .    Chief complaint is epigastric pain  She was recently diagnosed with plasma cell leukemia and is undergoing her primary oncologic, hematologic care through [facility]  Even with an interpreter the language barrier seems to be significant here.  Epigastric abd pain.  The pains first noticed 13 years ago.  She was told she had an ulcer (sounds like she had an EGD in [place]).  I cannot tell if she had any specific treatments for this. I cannot tell this was H. pylori related or other.  The pains have been getting worse since she started chemotherapy for leukemia, for about a month.  The pain is in epgastrium.  Very gassy.  She feels like there is a mass present.  She does not take NSAIDs.  + nausea, no vomiting.  She was started started on ppi omeprazole recently and she's not noticed any changes.  10/2014 [place] 1. No cholelithiasis or sonographic evidence of acute cholecystitis. 2. Mild splenomegaly. Stable perisplenic complex fluid collection unchanged compared with 08/27/2010. This may reflect sequela prior insult versus a lymphangioma. 3. Bilateral nephrolithiasis. No obstructive uropathy  11/2014 KUB normal  cmets recently reviewed, slowly increasing alk phos, alt  Review of systems: Pertinent positive and negative review of systems were noted in the above HPI section. Complete review of systems was performed and was otherwise normal.   Past Medical History  Diagnosis Date  . GERD (gastroesophageal reflux disease)   . Hepatitis   . History of positive PPD     DX 2011--  CXR DONE NO EVIDENCE  . Hydronephrosis, right   . Right ureteral stone   . History of ureter stent   . Chills with fever     intermittently since d/c from hospital  . Neuromuscular disorder (HCC)     legs numb intermittently  . Dysuria-frequency syndrome     w/  pink urine  . Urosepsis 8/14    admitted to [facility]  . Pneumonia     Past Surgical History  Procedure Laterality Date  . Cystoscopy w/ ureteral stent placement Right 09/25/2012    Procedure: CYSTOSCOPY WITH RETROGRADE PYELOGRAM/URETERAL STENT PLACEMENT;  Surgeon: Alexis Frock, MD;  Location: [facility];  Service: Urology;  Laterality: Right;  . Liver biopsy    . Right vats w/ drainage peural effusion and bx's  10-30-2008  . Removal of uterine cyst      years ago  . Other surgical history Right     removal of ovarian cyst  . Cystoscopy with retrograde pyelogram, ureteroscopy and stent placement Right 10/15/2012    Procedure: CYSTOSCOPY WITH RETROGRADE PYELOGRAM, URETEROSCOPY AND REMOVAL STENT WITH  STENT PLACEMENT;  Surgeon: Alexis Frock, MD;  Location: [facility];  Service: Urology;  Laterality: Right;  . Holmium laser application Right 7/34/2876    Procedure: HOLMIUM LASER APPLICATION;  Surgeon: Alexis Frock, MD;  Location: [facility];  Service: Urology;  Laterality: Right;    Current Outpatient Prescriptions  Medication Sig Dispense Refill  . acyclovir (ZOVIRAX) 400 MG tablet Take 1 tablet (400 mg total) by mouth 2 (two) times daily. 60 tablet 3  . allopurinol (ZYLOPRIM) 300 MG tablet Take 300 mg by mouth.    . Calcium Carbonate Antacid (MAALOX PO) Take by mouth as needed.    Marland Kitchen dexamethasone (DECADRON) 4 MG tablet Take  10 tab on the day of Velcade injection (every Tuesday and Thursday until 10/6, then every Thursday) 40 tablet 2  . entecavir (BARACLUDE) 0.5 MG tablet Take 0.5 mg by mouth.    . lidocaine-prilocaine (EMLA) cream Apply 1 application topically as needed. 30 g 6  . omeprazole (PRILOSEC) 20 MG capsule Take 1 capsule (20 mg total) by mouth daily. 30 capsule 2  . polyethylene glycol (MIRALAX / GLYCOLAX) packet Take 17 g by mouth as needed.     No current facility-administered medications for this visit.    Allergies as of 11/14/2014  . (No  Known Allergies)    Family History  Problem Relation Age of Onset  . Stomach cancer Mother   . Lung disease Father     Social History   Social History  . Marital Status: Single    Spouse Name: N/A  . Number of Children: N/A  . Years of Education: N/A   Occupational History  . Not on file.   Social History Main Topics  . Smoking status: Never Smoker   . Smokeless tobacco: Not on file  . Alcohol Use: No  . Drug Use: No  . Sexual Activity: Not Currently    Birth Control/ Protection: Abstinence   Other Topics Concern  . Not on file   Social History Narrative     Physical Exam: Ht 5' 2.25" (1.581 m)  Wt 130 lb 4 oz (59.081 kg)  BMI 23.64 kg/m2  LMP 10/31/2014 Constitutional: generally well-appearing Psychiatric: alert and oriented x3 Eyes: extraocular movements intact Mouth: oral pharynx moist, no lesions Neck: supple no lymphadenopathy Cardiovascular: heart regular rate and rhythm Lungs: clear to auscultation bilaterally Abdomen: soft, nontender, nondistended, no obvious ascites, no peritoneal signs, normal bowel sounds Extremities: no lower extremity edema bilaterally Skin: no lesions on visible extremities   Assessment and plan: 44 y.o. female with  epigastric abdominal pain, history of some sort of ulcer, recently diagnosed with plasma cell leukemia  She says her abdominal pains have gotten worse since she started chemotherapies. She has a history of an ulcer however I cannot tell exactly what this was from, how was treated. Blood counts last week were normal. I recommended that we proceed with upper endoscopy to see if she has recurrent ulcer disease, she will continue on once daily proton pump inhibitor for now. H. Pylori.     Owens Loffler, MD [facility] 11/14/2014, 3:28 PM  Cc: Dr. Burr Medico

## 2014-11-16 ENCOUNTER — Ambulatory Visit (HOSPITAL_COMMUNITY): Payer: 59 | Admitting: Anesthesiology

## 2014-11-16 ENCOUNTER — Encounter (HOSPITAL_COMMUNITY): Payer: Self-pay

## 2014-11-16 ENCOUNTER — Ambulatory Visit (HOSPITAL_COMMUNITY)
Admission: RE | Admit: 2014-11-16 | Discharge: 2014-11-16 | Disposition: A | Discharge status: Home / Self Care | Payer: 59 | Source: Ambulatory Visit | Attending: Gastroenterology | Admitting: Gastroenterology

## 2014-11-16 ENCOUNTER — Encounter (HOSPITAL_COMMUNITY)
Admission: RE | Disposition: A | Discharge status: Home / Self Care | Payer: 59 | Source: Ambulatory Visit | Attending: Gastroenterology

## 2014-11-16 DIAGNOSIS — Z79899 Other long term (current) drug therapy: Secondary | ICD-10-CM | POA: Insufficient documentation

## 2014-11-16 DIAGNOSIS — C9010 Plasma cell leukemia not having achieved remission: Secondary | ICD-10-CM | POA: Insufficient documentation

## 2014-11-16 DIAGNOSIS — K219 Gastro-esophageal reflux disease without esophagitis: Secondary | ICD-10-CM | POA: Diagnosis not present

## 2014-11-16 DIAGNOSIS — Z8711 Personal history of peptic ulcer disease: Secondary | ICD-10-CM

## 2014-11-16 DIAGNOSIS — R1013 Epigastric pain: Secondary | ICD-10-CM | POA: Insufficient documentation

## 2014-11-16 DIAGNOSIS — G709 Myoneural disorder, unspecified: Secondary | ICD-10-CM | POA: Insufficient documentation

## 2014-11-16 DIAGNOSIS — B1910 Unspecified viral hepatitis B without hepatic coma: Secondary | ICD-10-CM | POA: Diagnosis not present

## 2014-11-16 DIAGNOSIS — Z7952 Long term (current) use of systemic steroids: Secondary | ICD-10-CM | POA: Insufficient documentation

## 2014-11-16 HISTORY — PX: ESOPHAGOGASTRODUODENOSCOPY (EGD) WITH PROPOFOL: SHX5813

## 2014-11-16 LAB — COMPREHENSIVE METABOLIC PANEL
ALT: 25 U/L (ref 14–54)
AST: 33 U/L (ref 15–41)
Albumin: 2.6 g/dL — ABNORMAL LOW (ref 3.5–5.0)
Alkaline Phosphatase: 132 U/L — ABNORMAL HIGH (ref 38–126)
Anion gap: 4 — ABNORMAL LOW (ref 5–15)
BUN: 10 mg/dL (ref 6–20)
CHLORIDE: 105 mmol/L (ref 101–111)
CO2: 29 mmol/L (ref 22–32)
CREATININE: 0.43 mg/dL — AB (ref 0.44–1.00)
Calcium: 8.2 mg/dL — ABNORMAL LOW (ref 8.9–10.3)
GFR calc Af Amer: >60 mL/min (ref >60)
Glucose, Bld: 74 mg/dL (ref 65–99)
POTASSIUM: 3.8 mmol/L (ref 3.5–5.1)
Sodium: 138 mmol/L (ref 135–145)
Total Bilirubin: 0.6 mg/dL (ref 0.3–1.2)
Total Protein: 5.3 g/dL — ABNORMAL LOW (ref 6.5–8.1)

## 2014-11-16 SURGERY — ESOPHAGOGASTRODUODENOSCOPY (EGD) WITH PROPOFOL
Anesthesia: Monitor Anesthesia Care

## 2014-11-16 MED ORDER — PROPOFOL 10 MG/ML IV BOLUS
INTRAVENOUS | Status: AC
  Filled 2014-11-16: qty 20

## 2014-11-16 MED ORDER — LIDOCAINE HCL (CARDIAC) 20 MG/ML IV SOLN
INTRAVENOUS | Status: AC
  Filled 2014-11-16: qty 5

## 2014-11-16 MED ORDER — PROPOFOL 500 MG/50ML IV EMUL
INTRAVENOUS | Status: DC | PRN
  Administered 2014-11-16: 120 ug/kg/min via INTRAVENOUS

## 2014-11-16 MED ORDER — LACTATED RINGERS IV SOLN
INTRAVENOUS | Status: DC
  Administered 2014-11-16: 1000 mL via INTRAVENOUS

## 2014-11-16 MED ORDER — PROPOFOL 500 MG/50ML IV EMUL
INTRAVENOUS | Status: DC | PRN
  Administered 2014-11-16: 20 mg via INTRAVENOUS
  Administered 2014-11-16: 30 mg via INTRAVENOUS

## 2014-11-16 MED ORDER — SODIUM CHLORIDE 0.9 % IV SOLN: INTRAVENOUS | Status: DC

## 2014-11-16 SURGICAL SUPPLY — 14 items

## 2014-11-16 NOTE — Discharge Instructions (Signed)

## 2014-11-16 NOTE — Op Note (Signed)
[facility] [place], [address]   ENDOSCOPY PROCEDURE REPORT  PATIENT: Sheryl [name], Sheryl [name]  MR#: [id number] BIRTHDATE: 01-Nov-1970 , 15  yrs. old GENDER: female ENDOSCOPIST: Milus Banister, MD REFERRED BY:  Truitt Merle, MD PROCEDURE DATE:  11/16/2014 PROCEDURE:  EGD, diagnostic ASA CLASS:     Class II INDICATIONS:  epigastric pain, history of gastric ulcer ([place]), leukemia. MEDICATIONS: Monitored anesthesia care TOPICAL ANESTHETIC: none  DESCRIPTION OF PROCEDURE: After the risks benefits and alternatives of the procedure were thoroughly explained, informed consent was obtained.  The Pentax Gastroscope [id number] endoscope was introduced through the mouth and advanced to the second portion of the duodenum , Without limitations.  The instrument was slowly withdrawn as the mucosa was fully examined.   EXAM: The esophagus and gastroesophageal junction were completely normal in appearance.  The stomach was entered and closely examined.The antrum, angularis, and lesser curvature were well visualized, including a retroflexed view of the cardia and fundus. The stomach wall was normally distensable.  The scope passed easily through the pylorus into the duodenum.  Retroflexed views revealed no abnormalities.     The scope was then withdrawn from the patient and the procedure completed.  COMPLICATIONS: There were no immediate complications.  ENDOSCOPIC IMPRESSION: Normal appearing esophagus and GE junction, the stomach was well visualized and normal in appearance, normal appearing duodenum  RECOMMENDATIONS: Will check repeat labs today (cmet) and if LFTS are rising then will further evaluation the gallbladder (looked normal by [place] about a month ago but pains started since then and LFTs slightly elevated)    eSigned:  Milus Banister, MD 11/16/2014 1:51 PM

## 2014-11-16 NOTE — Anesthesia Postprocedure Evaluation (Signed)
  Anesthesia Post-op Note  Patient: Sheryl [name]  Procedure(s) Performed: Procedure(s) (LRB): ESOPHAGOGASTRODUODENOSCOPY (EGD) WITH PROPOFOL (N/A)  Patient Location: PACU  Anesthesia Type: MAC  Level of Consciousness: awake and alert   Airway and Oxygen Therapy: Patient Spontanous Breathing  Post-op Pain: mild  Post-op Assessment: Post-op Vital signs reviewed, Patient's Cardiovascular Status Stable, Respiratory Function Stable, Patent Airway and No signs of Nausea or vomiting  Last Vitals:  Filed Vitals:   11/16/14 1445  BP:   Pulse: 69  Temp:   Resp: 15    Post-op Vital Signs: stable   Complications: No apparent anesthesia complications

## 2014-11-16 NOTE — Interval H&P Note (Signed)
History and Physical Interval Note:  11/16/2014 12:38 PM  Sheryl [name]  has presented today for surgery, with the diagnosis of epigastric pain  The various methods of treatment have been discussed with the patient and family. After consideration of risks, benefits and other options for treatment, the patient has consented to  Procedure(s): ESOPHAGOGASTRODUODENOSCOPY (EGD) WITH PROPOFOL (N/A) as a surgical intervention .  The patient's history has been reviewed, patient examined, no change in status, stable for surgery.  I have reviewed the patient's chart and labs.  Questions were answered to the patient's satisfaction.     Milus Banister

## 2014-11-16 NOTE — Anesthesia Preprocedure Evaluation (Addendum)
Anesthesia Evaluation  Patient identified by MRN, date of birth, ID band Patient awake    Reviewed: Allergy & Precautions, NPO status , Patient's Chart, lab work & pertinent test results  Airway Mallampati: II  TM Distance: >3 FB Neck ROM: Full    Dental no notable dental hx.    Pulmonary pneumonia, resolved,  Abnormal CXR 10-07-14 reviewed.  She denies pulmonary symptoms today.   Pulmonary exam normal breath sounds clear to auscultation       Cardiovascular Exercise Tolerance: Good negative cardio ROS Normal cardiovascular exam Rhythm:Regular Rate:Normal     Neuro/Psych  Neuromuscular disease negative psych ROS   GI/Hepatic GERD  Medicated,(+) Hepatitis -, B  Endo/Other  negative endocrine ROS  Renal/GU Renal disease  negative genitourinary   Musculoskeletal negative musculoskeletal ROS (+)   Abdominal   Peds negative pediatric ROS (+)  Hematology  (+) anemia , Recently diagnosed with plasma cell leukemia. Treated at [facility].   Anesthesia Other Findings   Reproductive/Obstetrics negative OB ROS                            Anesthesia Physical Anesthesia Plan  ASA: II  Anesthesia Plan: MAC   Post-op Pain Management:    Induction: Intravenous  Airway Management Planned: Natural Airway  Additional Equipment:   Intra-op Plan:   Post-operative Plan:   Informed Consent: I have reviewed the patients History and Physical, chart, labs and discussed the procedure including the risks, benefits and alternatives for the proposed anesthesia with the patient or authorized representative who has indicated his/her understanding and acceptance.   Dental advisory given  Plan Discussed with: CRNA  Anesthesia Plan Comments:         Anesthesia Quick Evaluation

## 2014-11-16 NOTE — Transfer of Care (Signed)
Immediate Anesthesia Transfer of Care Note  Patient: Sheryl [name]  Procedure(s) Performed: Procedure(s): ESOPHAGOGASTRODUODENOSCOPY (EGD) WITH PROPOFOL (N/A)  Patient Location: PACU  Anesthesia Type:MAC  Level of Consciousness: awake, alert  and oriented  Airway & Oxygen Therapy: Patient Spontanous Breathing and Patient connected to nasal cannula oxygen  Post-op Assessment: Report given to RN and Post -op Vital signs reviewed and stable  Post vital signs: Reviewed and stable  Last Vitals:  Filed Vitals:   11/16/14 1155  BP: 100/62  Pulse: 79  Temp: 95.3 C    Complications: No apparent anesthesia complications

## 2014-11-16 NOTE — H&P (View-Only) (Signed)
sbe/s HPI: This is a  very pleasant Vietnamese speaking 44 year old woman   who was referred to me by Dr./ Burr Medico  to evaluate  epigastric pain .    Chief complaint is epigastric pain  She was recently diagnosed with plasma cell leukemia and is undergoing her primary oncologic, hematologic care through [facility]  Even with an interpreter the language barrier seems to be significant here.  Epigastric abd pain.  The pains first noticed 13 years ago.  She was told she had an ulcer (sounds like she had an EGD in [place]).  I cannot tell if she had any specific treatments for this. I cannot tell this was H. pylori related or other.  The pains have been getting worse since she started chemotherapy for leukemia, for about a month.  The pain is in epgastrium.  Very gassy.  She feels like there is a mass present.  She does not take NSAIDs.  + nausea, no vomiting.  She was started started on ppi omeprazole recently and she's not noticed any changes.  10/2014 [place] 1. No cholelithiasis or sonographic evidence of acute cholecystitis. 2. Mild splenomegaly. Stable perisplenic complex fluid collection unchanged compared with 08/27/2010. This may reflect sequela prior insult versus a lymphangioma. 3. Bilateral nephrolithiasis. No obstructive uropathy  11/2014 KUB normal  cmets recently reviewed, slowly increasing alk phos, alt  Review of systems: Pertinent positive and negative review of systems were noted in the above HPI section. Complete review of systems was performed and was otherwise normal.   Past Medical History  Diagnosis Date  . GERD (gastroesophageal reflux disease)   . Hepatitis   . History of positive PPD     DX 2011--  CXR DONE NO EVIDENCE  . Hydronephrosis, right   . Right ureteral stone   . History of ureter stent   . Chills with fever     intermittently since d/c from hospital  . Neuromuscular disorder (HCC)     legs numb intermittently  . Dysuria-frequency syndrome     w/  pink urine  . Urosepsis 8/14    admitted to [facility]  . Pneumonia     Past Surgical History  Procedure Laterality Date  . Cystoscopy w/ ureteral stent placement Right 09/25/2012    Procedure: CYSTOSCOPY WITH RETROGRADE PYELOGRAM/URETERAL STENT PLACEMENT;  Surgeon: Alexis Frock, MD;  Location: [facility];  Service: Urology;  Laterality: Right;  . Liver biopsy    . Right vats w/ drainage peural effusion and bx's  10-30-2008  . Removal of uterine cyst      years ago  . Other surgical history Right     removal of ovarian cyst  . Cystoscopy with retrograde pyelogram, ureteroscopy and stent placement Right 10/15/2012    Procedure: CYSTOSCOPY WITH RETROGRADE PYELOGRAM, URETEROSCOPY AND REMOVAL STENT WITH  STENT PLACEMENT;  Surgeon: Alexis Frock, MD;  Location: [facility];  Service: Urology;  Laterality: Right;  . Holmium laser application Right 7/34/2876    Procedure: HOLMIUM LASER APPLICATION;  Surgeon: Alexis Frock, MD;  Location: [facility];  Service: Urology;  Laterality: Right;    Current Outpatient Prescriptions  Medication Sig Dispense Refill  . acyclovir (ZOVIRAX) 400 MG tablet Take 1 tablet (400 mg total) by mouth 2 (two) times daily. 60 tablet 3  . allopurinol (ZYLOPRIM) 300 MG tablet Take 300 mg by mouth.    . Calcium Carbonate Antacid (MAALOX PO) Take by mouth as needed.    Marland Kitchen dexamethasone (DECADRON) 4 MG tablet Take  10 tab on the day of Velcade injection (every Tuesday and Thursday until 10/6, then every Thursday) 40 tablet 2  . entecavir (BARACLUDE) 0.5 MG tablet Take 0.5 mg by mouth.    . lidocaine-prilocaine (EMLA) cream Apply 1 application topically as needed. 30 g 6  . omeprazole (PRILOSEC) 20 MG capsule Take 1 capsule (20 mg total) by mouth daily. 30 capsule 2  . polyethylene glycol (MIRALAX / GLYCOLAX) packet Take 17 g by mouth as needed.     No current facility-administered medications for this visit.    Allergies as of 11/14/2014  . (No  Known Allergies)    Family History  Problem Relation Age of Onset  . Stomach cancer Mother   . Lung disease Father     Social History   Social History  . Marital Status: Single    Spouse Name: N/A  . Number of Children: N/A  . Years of Education: N/A   Occupational History  . Not on file.   Social History Main Topics  . Smoking status: Never Smoker   . Smokeless tobacco: Not on file  . Alcohol Use: No  . Drug Use: No  . Sexual Activity: Not Currently    Birth Control/ Protection: Abstinence   Other Topics Concern  . Not on file   Social History Narrative     Physical Exam: Ht 5' 2.25" (1.581 m)  Wt 130 lb 4 oz (59.081 kg)  BMI 23.64 kg/m2  LMP 10/31/2014 Constitutional: generally well-appearing Psychiatric: alert and oriented x3 Eyes: extraocular movements intact Mouth: oral pharynx moist, no lesions Neck: supple no lymphadenopathy Cardiovascular: heart regular rate and rhythm Lungs: clear to auscultation bilaterally Abdomen: soft, nontender, nondistended, no obvious ascites, no peritoneal signs, normal bowel sounds Extremities: no lower extremity edema bilaterally Skin: no lesions on visible extremities   Assessment and plan: 44 y.o. female with  epigastric abdominal pain, history of some sort of ulcer, recently diagnosed with plasma cell leukemia  She says her abdominal pains have gotten worse since she started chemotherapies. She has a history of an ulcer however I cannot tell exactly what this was from, how was treated. Blood counts last week were normal. I recommended that we proceed with upper endoscopy to see if she has recurrent ulcer disease, she will continue on once daily proton pump inhibitor for now. H. Pylori.     Owens Loffler, MD [facility] 11/14/2014, 3:28 PM  Cc: Dr. Burr Medico

## 2014-11-17 ENCOUNTER — Ambulatory Visit (HOSPITAL_BASED_OUTPATIENT_CLINIC_OR_DEPARTMENT_OTHER): Payer: 59 | Admitting: Hematology

## 2014-11-17 ENCOUNTER — Encounter: Payer: Self-pay | Admitting: Hematology

## 2014-11-17 ENCOUNTER — Telehealth: Payer: Self-pay | Admitting: *Deleted

## 2014-11-17 ENCOUNTER — Ambulatory Visit: Payer: 59

## 2014-11-17 ENCOUNTER — Telehealth: Payer: Self-pay | Admitting: Hematology

## 2014-11-17 ENCOUNTER — Ambulatory Visit (HOSPITAL_BASED_OUTPATIENT_CLINIC_OR_DEPARTMENT_OTHER): Payer: 59

## 2014-11-17 ENCOUNTER — Other Ambulatory Visit (HOSPITAL_BASED_OUTPATIENT_CLINIC_OR_DEPARTMENT_OTHER): Payer: 59

## 2014-11-17 VITALS — BP 106/67 | HR 89 | Temp 97.1°F | Resp 20 | Ht 63.0 in | Wt 130.1 lb

## 2014-11-17 DIAGNOSIS — D649 Anemia, unspecified: Secondary | ICD-10-CM

## 2014-11-17 DIAGNOSIS — C9010 Plasma cell leukemia not having achieved remission: Secondary | ICD-10-CM

## 2014-11-17 DIAGNOSIS — B181 Chronic viral hepatitis B without delta-agent: Secondary | ICD-10-CM | POA: Diagnosis not present

## 2014-11-17 DIAGNOSIS — Z5112 Encounter for antineoplastic immunotherapy: Secondary | ICD-10-CM | POA: Diagnosis not present

## 2014-11-17 DIAGNOSIS — K5900 Constipation, unspecified: Secondary | ICD-10-CM

## 2014-11-17 DIAGNOSIS — R630 Anorexia: Secondary | ICD-10-CM

## 2014-11-17 DIAGNOSIS — Z5111 Encounter for antineoplastic chemotherapy: Secondary | ICD-10-CM

## 2014-11-17 DIAGNOSIS — Z95828 Presence of other vascular implants and grafts: Secondary | ICD-10-CM

## 2014-11-17 DIAGNOSIS — D72829 Elevated white blood cell count, unspecified: Secondary | ICD-10-CM

## 2014-11-17 LAB — COMPREHENSIVE METABOLIC PANEL (CC13)
ALBUMIN: 2.9 g/dL — AB (ref 3.5–5.0)
ALK PHOS: 185 U/L — AB (ref 40–150)
ALT: 28 U/L (ref 0–55)
ANION GAP: 7 meq/L (ref 3–11)
AST: 42 U/L — ABNORMAL HIGH (ref 5–34)
BUN: 10.7 mg/dL (ref 7.0–26.0)
CALCIUM: 8.4 mg/dL (ref 8.4–10.4)
CHLORIDE: 107 meq/L (ref 98–109)
CO2: 24 mEq/L (ref 22–29)
Creatinine: 0.6 mg/dL (ref 0.6–1.1)
Glucose: 133 mg/dl (ref 70–140)
POTASSIUM: 4 meq/L (ref 3.5–5.1)
Sodium: 138 mEq/L (ref 136–145)
Total Bilirubin: 0.37 mg/dL (ref 0.20–1.20)
Total Protein: 6.2 g/dL — ABNORMAL LOW (ref 6.4–8.3)

## 2014-11-17 LAB — CBC WITH DIFFERENTIAL/PLATELET
BASO%: 0.2 % (ref 0.0–2.0)
BASOS ABS: 0 10*3/uL (ref 0.0–0.1)
EOS ABS: 0 10*3/uL (ref 0.0–0.5)
EOS%: 0 % (ref 0.0–7.0)
HEMATOCRIT: 31 % — AB (ref 34.8–46.6)
HEMOGLOBIN: 10.2 g/dL — AB (ref 11.6–15.9)
LYMPH#: 0.2 10*3/uL — AB (ref 0.9–3.3)
LYMPH%: 2.8 % — ABNORMAL LOW (ref 14.0–49.7)
MCH: 31.2 pg (ref 25.1–34.0)
MCHC: 33 g/dL (ref 31.5–36.0)
MCV: 94.5 fL (ref 79.5–101.0)
MONO#: 0.1 10*3/uL (ref 0.1–0.9)
MONO%: 1.9 % (ref 0.0–14.0)
NEUT#: 7.1 10*3/uL — ABNORMAL HIGH (ref 1.5–6.5)
NEUT%: 95.1 % — AB (ref 38.4–76.8)
PLATELETS: 293 10*3/uL (ref 145–400)
RBC: 3.28 10*6/uL — ABNORMAL LOW (ref 3.70–5.45)
RDW: 21.6 % — ABNORMAL HIGH (ref 11.2–14.5)
WBC: 7.5 10*3/uL (ref 3.9–10.3)

## 2014-11-17 LAB — MAGNESIUM (CC13): MAGNESIUM: 1.8 mg/dL (ref 1.5–2.5)

## 2014-11-17 MED ORDER — ONDANSETRON HCL 8 MG PO TABS
8.0000 mg | ORAL_TABLET | Freq: Once | ORAL | Status: AC
  Administered 2014-11-17: 8 mg via ORAL

## 2014-11-17 MED ORDER — SODIUM CHLORIDE 0.9 % IJ SOLN
10.0000 mL | INTRAMUSCULAR | Status: DC | PRN
  Administered 2014-11-17: 10 mL via INTRAVENOUS
  Filled 2014-11-17: qty 10

## 2014-11-17 MED ORDER — ONDANSETRON HCL 8 MG PO TABS
ORAL_TABLET | ORAL | Status: AC
  Filled 2014-11-17: qty 1

## 2014-11-17 MED ORDER — SODIUM CHLORIDE 0.9 % IV SOLN
300.0000 mg/m2 | Freq: Once | INTRAVENOUS | Status: AC
  Administered 2014-11-17: 480 mg via INTRAVENOUS
  Filled 2014-11-17: qty 24

## 2014-11-17 MED ORDER — SODIUM CHLORIDE 0.9 % IV SOLN
INTRAVENOUS | Status: DC
  Administered 2014-11-17: 12:00:00 via INTRAVENOUS

## 2014-11-17 MED ORDER — BORTEZOMIB CHEMO SQ INJECTION 3.5 MG (2.5MG/ML)
1.5000 mg/m2 | Freq: Once | INTRAMUSCULAR | Status: AC
  Administered 2014-11-17: 2.5 mg via SUBCUTANEOUS
  Filled 2014-11-17: qty 2.5

## 2014-11-17 MED ORDER — DEXAMETHASONE 4 MG PO TABS: 40.0000 mg | ORAL_TABLET | ORAL | Status: DC

## 2014-11-17 MED ORDER — HEPARIN SOD (PORK) LOCK FLUSH 100 UNIT/ML IV SOLN
500.0000 [IU] | Freq: Once | INTRAVENOUS | Status: DC
  Administered 2014-11-17: 500 [IU] via INTRAVENOUS
  Filled 2014-11-17: qty 5

## 2014-11-17 NOTE — Telephone Encounter (Signed)
per pof to sh pt appt-gave pt copy of avs-sent MW emailt o sch trmt

## 2014-11-17 NOTE — Patient Instructions (Signed)
Bortezomib injection ?y l thu?c g? BORTEZOMIB l thu?c nh?m ??n cc protein trong cc t? bo ung th? v lm cho t? bo ung th? ng?ng pht tri?n. Thu?c ???c dng ?? ?i?u tr? b?nh ?a u t?y (multiple myeloma) v b??u b?ch huy?t t? 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 Thu?c ny c th? ???c dng cho nh?ng m?c ?ch khc; hy h?i ng??i cung c?p d?ch v? y t? ho?c d??c s? c?a mnh, n?u qu v? c th?c m?c. Ti c?n ph?i bo cho ng??i cung c?p d?ch v? y t? c?a mnh ?i?u g tr??c khi dng thu?c ny? H? c?n bi?t li?u qu v? c b?t k? tnh tr?ng no sau ?y khng: -b?nh ti?u ???ng -b?nh tim -nh?p tim khng ??u -b?nh gan -?ang ???c th?m phn -s? l??ng t? bo mu th?p, ch?ng h?n nh? s? l??ng b?ch c?u, ti?u c?u, ho?c h?ng c?u th?p -b?nh th?n kinh ngo?i bin -?ang dng thu?c ?i?u tr? huy?t p cao -pha?n ??ng b?t th???ng ho??c di? ??ng v??i bortezomib, mannitol, ho?c boron -pha?n ??ng b?t th???ng ho??c di? ??ng v??i ca?c d??c ph?m kha?c, th?c ph?m, thu?c nhu?m, ho??c ch?t ba?o qua?n -?ang c thai ho??c ??nh co? thai -?ang cho con bu? Ti nn s? d?ng thu?c ny nh? th? no? Thu?c ny ?? tim d??i da ho?c tim vo t?nh m?ch. Thu?c ny ???c s? d?ng b?i chuyn vin y t? ? b?nh vi?n ho?c ? phng m?ch. Hy bn v?i bc s? nhi khoa c?a qu v? v? vi?c dng thu?c ny ? tr? em. C th? c?n ch?m [place] ??c bi?t. Qu li?u: N?u qu v? cho r?ng mnh ? dng qu nhi?u thu?c ny, th hy lin l?c v?i trung tm ki?m sot ch?t ??c ho?c phng c?p c?u ngay l?p t?c. L?U : Thu?c ny ch? dnh ring cho qu v?. Khng chia s? thu?c ny v?i nh?ng ng??i khc. N?u ti l? qun m?t li?u th sao? ?i?u quan tr?ng l khng nn b? l? li?u thu?c no. Hy lin l?c v?i bc s? ho?c [place] vin y t? c?a mnh, n?u qu v? khng th? gi? ?ng cu?c h?n khm. Nh?ng g c th? t??ng tc v?i thu?c ny? Thu?c ny c th? t??ng tc v?i cc thu?c sau ?y: -ketoconazole -rifampicin -ritonavir -cy [facility] (c? [facility]/cy n?c s?i/cy l?nh) Danh  sch ny c th? khng m t? ?? h?t cc t??ng tc c th? x?y ra. Hy ??a cho ng??i cung c?p d?ch v? y t? c?a mnh danh sch t?t c? cc thu?c, th?o d??c, cc thu?c khng c?n toa, ho?c cc ch? ph?m b? sung m qu v? dng. C?ng nn bo cho h? bi?t r?ng qu v? c ht thu?c, u?ng r??u, ho?c c s? d?ng ma ty tri php hay khng. Vi th? c th? t??ng tc v?i thu?c c?a qu v?. Ti c?n ph?i theo di ?i?u g trong khi dng thu?c ny? Hy ?i g?p bc s? ho?c [place] vin y t? ?? theo di ??nh k? s? c?i thi?n c?a qu v?. Thu?c ny c th? lm cho qu v? c?m th?y khng ???c kh?e nh? th??ng l?. ?i?u ny khng ph?i khng ph? bi?n, b?i v thu?c ha tr? li?u c th? ?nh h??ng ??n c? t? bo lnh l?n t? bo ung th?. Hy t??ng trnh m?i tc d?ng ph?. Hy ti?p t?c ??t ?i?u tr? c?a mnh ngay c? khi qu v? c?m th?y m?t, tr? khi bc s? yu c?u qu v? ng?ng ?i?u tr?Sander Nephew v? c th? b? bu?n  ng? ho?c chng m?t. Khng ???c li xe, s? d?ng my mc, ho?c lm nh?ng vi?c c?n ph?i t?nh to cho t?i khi qu v? bi?t ???c thu?c ny ?nh h??ng ln qu v? nh? th? no. Khng ???c ng?i d?y ho?c ??ng d?y nhanh, ??c bi?t l khi qu v? l b?nh nhn l?n tu?i. ?i?u ny lm gi?m nguy c? b? chng m?t ho?c ng?t x?u. Trong m?t s? tr??ng h?p, qu v? c th? ???c cho dng cc thu?c ph? thm ?? gip gi?m tc d?ng ph?. Hy lm theo t?t c? cc h??ng d?n v? vi?c s? d?ng chng. Hy h?i  ki?n bc s? ho?c chuyn vin y t?, n?u qu v? b? s?t, ?n l?nh ho?c ?au h?ng, ho?c c cc tri?u ch?ng khc c?a c?m l?nh ho?c cm. Khng ???c t? ?i?u tr? cho mnh. Thu?c ny c th? lm gi?m kh? n?ng ch?ng l?i cc b?nh nhi?m trng c?a c? th?. Hy c? trnh ? g?n nh?ng ng??i b? b?nh. Thu?c ny c th? lm t?ng nguy c? b? b?m tm ho?c ch?y mu. Hy lin l?c v?i bc s? ho?c chuyn vin y t?, n?u qu v? th?y ch?y mu b?t th??ng. [name] s? c?n ?i lm cc xt nghi?m mu ??nh k? trong khi qu v? dng thu?c ny. ? m?t s? b?nh nhn, thu?c ny c th? gy ra nhi?m trng no nghim tr?ng c th? d?n ??n  t? vong. N?u qu v? c b?t k? v?n ?? no v? kh? n?ng nhn, suy ngh?, ni, ?i l?i ho?c ??ng, th hy bo ngay cho bc s?. N?u qu v? khng th? lin l?c ???c v?i bc s?, thi? ha?y tm ngay n?i ch?m [place] y t? khc. Khng ???c ?? c thai trong khi dng thu?c ny. Ph? n? c?n ph?i thng bo cho bc s? c?a mnh, n?u mu?n c thai ho?c ngh? r?ng c th? mnh ? c [place]. C nguy c? v? cc tc d?ng ph? nghim tr?ng ??i v?i [place] nhi. Hy th?o lu?n v?i bc s? ho?c chuyn vin y t? ho?c d??c s? ?? bi?t thm thng tin. Khng ???c nui con b?ng s?a m? trong khi dng thu?c ny. Hy lin l?c v?i bc s? ho?c chuyn vin y t? n?u qu v? b? tiu ch?y n?ng, bu?n i v i m?a, ho?c n?u qu v? b? ra nhi?u m? hi. Tnh tr?ng m?t qu nhi?u d?ch c? th? c th? gy nguy hi?m khi qu v? dng thu?c ny. Ti c th? nh?n th?y nh?ng tc d?ng ph? no khi dng thu?c ny? Nh?ng tc d?ng ph? qu v? c?n ph?i bo cho bc s? ho?c chuyn vin y t? cng s?m cng t?t: -cc ph?n ?ng d? ?ng, ch?ng h?n nh? da b? m?n ??, ng?a, n?i my ?ay, s?ng ? m?t, mi, ho?c l??i -kh th? -thay ??i thnh l?c -thay ??i th? l?c -tim ??p nhanh ho?c khng ??u -c?m th?y chong vng, ng?t x?u, b? t -?au, t ho?c c?m gic nh? b? ki?n b ? bn tay ho?c bn chn -?au ? vng b?ng trn bn ph?i -co gi?t (kinh phong) -s?ng ? m?t c chn, bn chn, ho?c bn tay -b? b?m tm ho?c xu?t huy?t b?t th??ng -m?t m?i ho?c y?u ?t b?t th??ng -i m?a -vng da ho?c m?t Cc tc d?ng ph? khng c?n ph?i ch?m [place] y t? (hy bo cho bc s? ho?c chuyn vin y t?, n?u cc tc d?ng ph? ny ti?p di?n ho?c gy phi?n toi): -cc thay ??i c?m xc ho?c tm tr?ng -  to bn -tiu ch?y -m?t c?m gic ngon mi?ng -?au ??u -kch ?ng ? ch? tim -bu?n i Danh sch ny c th? khng m t? ?? h?t cc tc d?ng ph? c th? x?y ra. Xin g?i t?i bc s? c?a mnh ?? ???c c? v?n chuyn mn v? cc tc d?ng ph?Sander Nephew v? c th? t??ng trnh cc tc d?ng ph? cho FDA theo s? [phone]. Ti nn c?t gi? thu?c c?a  mnh ? ?u? Thu?c ny ???c s? d?ng b?i chuyn vin y t? ? b?nh vi?n ho?c ? phng m?ch. [name] s? khng ???c c?p thu?c ny ?? c?t gi? t?i nh. L?U : ?y l b?n tm t?t. N c th? khng bao hm t?t c? thng tin c th? c. N?u qu v? th?c m?c v? thu?c ny, xin trao ??i v?i bc s?, d??c s?, ho?c ng??i cung c?p d?ch v? y t? c?a mnh.    2016, Elsevier/Gold Standard. (2014-06-02 00:00:00) Cyclophosphamide injection ?y l thu?c g? CYCLOPHOSPHAMIDE l thu?c ha tr? li?u. N lm ch?m s? pht tri?n c?a cc t? bo ung th?. Thu?c ny ???c dng ?? ?i?u tr? nhi?u lo?i ung th?, ch?ng h?n nh? b?nh b??u b?ch huy?t, b??u t?y, b?nh b?ch c?u c tnh, ung th? v, v ung th? bu?ng tr?ng. Thu?c ny c th? ???c dng cho nh?ng m?c ?ch khc; hy h?i ng??i cung c?p d?ch v? y t? ho?c d??c s? c?a mnh, n?u qu v? c th?c m?c. Ti c?n ph?i bo cho ng??i cung c?p d?ch v? y t? c?a mnh ?i?u g tr??c khi dng thu?c ny? H? c?n bi?t li?u qu v? c b?t k? tnh tr?ng no sau ?y khng: -cc r?i lo?n v? mu -ti?n s? ha tr? li?u khc -nhi?m trng -b?nh th?n -b?nh gan -m?i x? tr? g?n ?y ho?c ?ang x? tr? -u b??u trong t?y x??ng -pha?n ??ng b?t th???ng ho??c di? ??ng v??i cyclophosphamide ho?c cc thu?c ha tr? li?u khc -pha?n ??ng b?t th???ng ho??c di? ??ng v??i ca?c d??c ph?m kha?c, th?c ph?m, thu?c nhu?m, ho??c ch?t ba?o qua?n -?ang c thai ho??c ??nh co? thai -?ang cho con bu? Ti nn s? d?ng thu?c ny nh? th? no? Thu?c ny ?? tim vo t?nh m?ch ho?c vo b?p th?t, ho?c ?? truy?n vo t?nh m?ch. Thu?c ny ???c cho trong b?nh vi?n ho?c phng m?ch b?i chuyn vin y t? ???c hu?n luy?n ??c bi?t. Hy bn v?i bc s? nhi khoa c?a qu v? v? vi?c dng thu?c ny ? tr? em. C th? c?n ch?m [place] ??c bi?t. Qu li?u: N?u qu v? cho r?ng mnh ? dng qu nhi?u thu?c ny, th hy lin l?c v?i trung tm ki?m sot ch?t ??c ho?c phng c?p c?u ngay l?p t?c. L?U : Thu?c ny ch? dnh ring cho qu v?. Khng chia s? thu?c ny v?i nh?ng  ng??i khc. N?u ti l? qun m?t li?u th sao? ?i?u quan tr?ng l khng nn b? l? li?u thu?c no. Hy lin l?c v?i bc s? ho?c [place] vin y t? c?a mnh, n?u qu v? khng th? gi? ?ng cu?c h?n khm. Nh?ng g c th? t??ng tc v?i thu?c ny? Thu?c ny c th? t??ng tc v?i cc thu?c sau ?y: -amiodarone -amphotericin B -azathioprine -m?t s? thu?c khng virus dng ?? tr? nhi?m HIV ho?c AIDS nh? cc thu?c ?c ch? men protease (protease inhibitor) (v d? nh? indinavir, ritonavir) v zidovudine -m?t s? thu?c dng ?? tr? huy?t p cao nh? benazepril, captopril, enalapril, fosinopril, lisinopril, moexipril, monopril, perindopril,  quinapril, ramipril, trandolapril -m?t s? thu?c tr? ung th? nh? cc thu?c anthracycline (v d? nh? daunorubicin, doxorubicin), busulfan, cytarabine, paclitaxel, pentostatin, tamoxifen, trastuzumab -m?t s? thu?c l?i ti?u nh? chlorothiazide, chlorthalidone, hydrochlorothiazide, indapamide, metolazone -m?t s? thu?c ?i?u tr? ho?c phng ng?a c?c mu ?ng, ch?ng h?n nh? warfarin -m?t s? thu?c gin c? b?p ch?ng h?n nh? succinylcholine -cyclosporine -etanercept -indomethacin -m?t s? thu?c lm t?ng s? l??ng t? bo mu, ch?ng h?n nh? filgrastim, pegfilgrastim, sargramostim -cc thu?c dng ?? gy m ton thn -metronidazole -natalizumab Danh sch ny c th? khng m t? ?? h?t cc t??ng tc c th? x?y ra. Hy ??a cho ng??i cung c?p d?ch v? y t? c?a mnh danh sch t?t c? cc thu?c, th?o d??c, cc thu?c khng c?n toa, ho?c cc ch? ph?m b? sung m qu v? dng. C?ng nn bo cho h? bi?t r?ng qu v? c ht thu?c, u?ng r??u, ho?c c s? d?ng ma ty tri php hay khng. Vi th? c th? t??ng tc v?i thu?c c?a qu v?. Ti c?n ph?i theo di ?i?u g trong khi dng thu?c ny? Hy ?i g?p bc s? ho?c [place] vin y t? ?? theo di ??nh k? s? c?i thi?n c?a qu v?. Thu?c ny c th? lm cho qu v? c?m th?y khng ???c kh?e nh? th??ng l?. ?i?u ny khng ph?i khng ph? bi?n, b?i v thu?c ha tr? li?u c th? ?nh  h??ng ??n c? t? bo lnh l?n t? bo ung th?. Hy t??ng trnh m?i tc d?ng ph?. Hy ti?p t?c ??t ?i?u tr? c?a mnh ngay c? khi qu v? c?m th?y m?t, tr? khi bc s? yu c?u qu v? ng?ng ?i?u tr?. Hy u?ng n??c ho?c ch?t l?ng nh? ? ???c ch? d?n. Hy ?i ti?u th??ng xuyn, k? c? ban ?m. Trong m?t s? tr??ng h?p, qu v? c th? ???c cho dng cc thu?c ph? thm ?? gip gi?m tc d?ng ph?. Hy lm theo t?t c? cc h??ng d?n v? vi?c s? d?ng chng. Hy h?i  ki?n bc s? ho?c chuyn vin y t?, n?u qu v? b? s?t, ?n l?nh ho?c ?au h?ng, ho?c c cc tri?u ch?ng khc c?a c?m l?nh ho?c cm. Khng ???c t? ?i?u tr? cho mnh. Thu?c ny c th? lm gi?m kh? n?ng ch?ng l?i cc b?nh nhi?m trng c?a c? th?. Hy c? trnh ? g?n nh?ng ng??i b? b?nh. Thu?c ny c th? lm t?ng nguy c? b? b?m tm ho?c ch?y mu. Hy lin l?c v?i bc s? ho?c chuyn vin y t?, n?u qu v? th?y ch?y mu b?t th??ng. Hy c?n th?n khi ?nh r?ng ho?c x?a r?ng b?ng ch? nha khoa ho?c b?ng t?m, b?i v qu v? c th? d? b? nhi?m trng ho?c d? b? ch?y mu h?n. N?u qu v? c ?i lm r?ng, th hy bo v?i nha s? r?ng qu v? ?ang dng thu?c ny. [name] c th? b? bu?n ng? ho?c chng m?t. Khng ???c li xe, s? d?ng my mc, ho?c lm nh?ng vi?c c?n ph?i t?nh to cho t?i khi qu v? bi?t ???c thu?c ny ?nh h??ng ln qu v? nh? th? no. Khng ???c c thai trong th?i gian u?ng thu?c ny ho?c trong vng 1 n?m sau khi ng?ng dng thu?c. Ph? n? c?n ph?i thng bo cho bc s? c?a mnh, n?u mu?n c thai ho?c ngh? r?ng c th? mnh ? c [place]. Nam gi??i khng nn gy thu? thai trong khi du?ng thu?c na?y va? trong vng 4 tha?ng sau khi ng?ng thu?c.  C nguy c? v? cc tc d?ng ph? nghim tr?ng ??i v?i [place] nhi. Hy th?o lu?n v?i bc s? ho?c chuyn vin y t? ho?c d??c s? ?? bi?t thm thng tin. Khng ???c nui con b?ng s?a m? trong khi dng thu?c ny. Thu?c na?y co? th? c?n tr? kha? n?ng co? con. Thu?c ny ?a? gy suy bu?ng tr??ng ?? m?t s? phu? n??. Nam gi?i c th? b? gi?m s? l??ng tinh  trng trong khi dng thu?c ny. Quy? vi? nn th?o lu?n v?i bc s? ho?c [place] vin y t? c?a mnh, n?u qu v? lo l??ng v? kha? n?ng thu? tinh. N?u qu v? s?p ???c gi?i ph?u, th hy bo cho bc s? ho?c chuyn vin y t? bi?t r?ng qu v? ? dng thu?c ny. Ti c th? nh?n th?y nh?ng tc d?ng ph? no khi dng thu?c ny? Nh?ng tc d?ng ph? qu v? c?n ph?i bo cho bc s? ho?c chuyn vin y t? cng s?m cng t?t: -cc ph?n ?ng d? ?ng, ch?ng h?n nh? da b? m?n ??, ng?a, n?i my ?ay, s?ng ? m?t, mi, ho?c l??i -gi?m s? l??ng t? bo mu - thu?c ny c th? lm gi?m s? l??ng t? bo b?ch c?u, t? bo h?ng c?u v ti?u c?u. [name] c th? c nguy c? cao b? nhi?m trng v xu?t huy?t. -cc d?u hi?u nhi?m trng - s?t ho?c ?n l?nh, ho, ?au h?ng, kh ?i ti?u ho?c ?i ti?u ?au -cc d?u hi?u gi?m ti?u c?u ho?c xu?t huy?t - b?m tm, cc n?t l?m t?m ?? trn da, phn c mu ?en, mu h?c n, c mu trong n??c ti?u -cc d?u hi?u gi?m s? l??ng t? bo h?ng c?u - c?m th?y y?u ?t ho?c m?t m?i m?t cch b?t th??ng, cc c?n ng?t x?u, chong vng -kh th? -n??c ti?u s?m mu -chng m?t -?nh tr?ng ng?c -s?ng ? m?t c chn, bn chn, ho?c bn tay -kh ?i ti?u ho?c thay ??i l??ng n??c ti?u ???c bi ti?t -t?ng cn -vng da ho?c m?t Cc tc d?ng ph? khng c?n ph?i ch?m [place] y t? (hy bo cho bc s? ho?c chuyn vin y t?, n?u cc tc d?ng ph? ny ti?p di?n ho?c gy phi?n toi): -cc thay ??i mu mng ho?c da -r?ng tc -khng th?y kinh nguy?t -lot mi?ng -bu?n i ho?c i m?a Danh sch ny c th? khng m t? ?? h?t cc tc d?ng ph? c th? x?y ra. Xin g?i t?i bc s? c?a mnh ?? ???c c? v?n chuyn mn v? cc tc d?ng ph?Sander Nephew v? c th? t??ng trnh cc tc d?ng ph? cho FDA theo s? [phone]. Ti nn c?t gi? thu?c c?a mnh ? ?u? Thu?c ny ???c s? d?ng b?i chuyn vin y t? ? b?nh vi?n ho?c ? phng m?ch. [name] s? khng ???c c?p thu?c ny ?? c?t gi? t?i nh. L?U : ?y l b?n tm t?t. N c th? khng bao hm t?t c? thng tin c th? c.  N?u qu v? th?c m?c v? thu?c ny, xin trao ??i v?i bc s?, d??c s?, ho?c ng??i cung c?p d?ch v? y t? c?a mnh.    2016, [employer]. (2012-01-20 00:00:00)

## 2014-11-17 NOTE — Progress Notes (Signed)
[facility]  [phone] [phone]  Clinic Follow up Note   Patient Care Team: Truitt Merle, MD as PCP - General (Hematology) [name], MD as Referring Physician (Specialist) [name], MD as Referring Physician (Specialist) 11/17/2014    CHIEF COMPLAINTS:  Follow up acute plasma cell leukemia   HISTORY OF PRESENTING ILLNESS:  Sheryl [name] 44 y.o. female is here because of recently diagnosed plasma cell leukemia. She was recently discharged form [facility] 3 days ago and is here to establish her local oncological care with [place]. She is a [place], does not speak [place], she is accompanied to the clinic by her daughter and interpreter.  She presented to our hospital lth with worsening dyspnea, fatigue, cough, subjective fevers and chills, and was admitted on 09/14/2014. She was found to have allergy WBC 54.1K, hemoglobin 8.1, plt 310, she was treated with symptom management, and was discharged home on 09/17/2014, with a plan to follow-up with hematology. She represents to emergency room on 10/07/2014, was found to have white count of 78K, and worsening anemia with hemoglobin 6. She was seen by my partner Dr. Jonette Eva and plasma cell leukemia was suspected. She was transferred to [facility] for further leukemia work-up and treatment. Bone marrow biopsy was done, which confirmed plasma cell leukemia, and she started chemotherapy with Velcade, Cytoxan and dexamethasone. She received weekly dose twice, last dose on 10/20/2014. She was discharged to home afterwards. She had a mediport placed during her hospitalization.  She has moderate fatigue, low appetie, she lost about 13 lbs in the past few months. She has moderate abdominal pain, 5-6/10, persistent, she does not take any pain meds.   I reviewed her medical records extensively, and discussed her case with Dr. Jorje Guild and [place] program coordinator.  INTERIM HISTORY: Sheryl [name]  returns for follow up and cycle 2 CyborD. She is doing better overall. Her epigastric pain has improved. She still has some abdominal bloating, appetite is better this week. She was seen by GI Dr. Ardis Hughs and underwent EGD yesterday, which was normal. No other new complains.   MEDICAL HISTORY:  Past Medical History  Diagnosis Date  . GERD (gastroesophageal reflux disease)   . Hepatitis   . History of positive PPD     DX 2011--  CXR DONE NO EVIDENCE  . Hydronephrosis, right   . Right ureteral stone   . History of ureter stent   . Chills with fever     intermittently since d/c from hospital  . Dysuria-frequency syndrome     w/ pink urine  . Urosepsis 8/14    admitted to [facility]  . Pneumonia   . Neuromuscular disorder (HCC)     legs numb intermittently    SURGICAL HISTORY: Past Surgical History  Procedure Laterality Date  . Cystoscopy w/ ureteral stent placement Right 09/25/2012    Procedure: CYSTOSCOPY WITH RETROGRADE PYELOGRAM/URETERAL STENT PLACEMENT;  Surgeon: Alexis Frock, MD;  Location: [facility];  Service: Urology;  Laterality: Right;  . Liver biopsy    . Right vats w/ drainage peural effusion and bx's  10-30-2008  . Removal of uterine cyst      years ago  . Other surgical history Right     removal of ovarian cyst  . Cystoscopy with retrograde pyelogram, ureteroscopy and stent placement Right 10/15/2012    Procedure: CYSTOSCOPY WITH RETROGRADE PYELOGRAM, URETEROSCOPY AND REMOVAL STENT WITH  STENT PLACEMENT;  Surgeon: Alexis Frock, MD;  Location: [facility];  Service: Urology;  Laterality: Right;  . Holmium laser application Right 4/36/0677    Procedure: HOLMIUM LASER APPLICATION;  Surgeon: Alexis Frock, MD;  Location: [facility];  Service: Urology;  Laterality: Right;  . Esophagogastroduodenoscopy (egd) with propofol N/A 11/16/2014    Procedure: ESOPHAGOGASTRODUODENOSCOPY (EGD) WITH PROPOFOL;  Surgeon: Milus Banister, MD;  Location: [facility];  Service: Endoscopy;  Laterality: N/A;    SOCIAL HISTORY: Social History   Social History  . Marital Status: Single    Spouse Name: N/A  . Number of Children: 15   . Years of Education: N/A   Occupational History  . Not on file.   Social History Main Topics  . Smoking status: Never Smoker   . Smokeless tobacco: Not on file  . Alcohol Use: No  . Drug Use: No  . Sexual Activity: Not Currently    Birth Control/ Protection: Abstinence   Other Topics Concern  . Not on file   Social History Narrative    FAMILY HISTORY: Family History  Problem Relation Age of Onset  . Stomach cancer Mother   . Lung disease Father   . Asthma Father     ALLERGIES:  has No Known Allergies.  MEDICATIONS:  Current Outpatient Prescriptions  Medication Sig Dispense Refill  . acyclovir (ZOVIRAX) 400 MG tablet Take 1 tablet (400 mg total) by mouth 2 (two) times daily. 60 tablet 3  . allopurinol (ZYLOPRIM) 300 MG tablet Take 300 mg by mouth daily.     Marland Kitchen dexamethasone (DECADRON) 4 MG tablet Take 10 tablets (40 mg total) by mouth once a week. On the day of chemo treatment 40 tablet 2  . entecavir (BARACLUDE) 0.5 MG tablet Take 0.5 mg by mouth daily.     Marland Kitchen lidocaine-prilocaine (EMLA) cream Apply 1 application topically as needed. (Patient taking differently: Apply 1 application topically as needed (port). ) 30 g 6  . omeprazole (PRILOSEC) 20 MG capsule Take 1 capsule (20 mg total) by mouth daily. 30 capsule 2  . polyethylene glycol (MIRALAX / GLYCOLAX) packet Take 17 g by mouth as needed for moderate constipation.      No current facility-administered medications for this visit.   Facility-Administered Medications Ordered in Other Visits  Medication Dose Route Frequency Provider Last Rate Last Dose  . 0.9 %  sodium chloride infusion   Intravenous Continuous Truitt Merle, MD   Stopped at 11/17/14 1332    REVIEW OF SYSTEMS:   Constitutional: Denies fevers, chills or abnormal night  sweats Eyes: Denies blurriness of vision, double vision or watery eyes Ears, nose, mouth, throat, and face: Denies mucositis or sore throat Respiratory: Denies cough, dyspnea or wheezes Cardiovascular: Denies palpitation, chest discomfort or lower extremity swelling Gastrointestinal:  Denies nausea, heartburn or change in bowel habits Skin: Denies abnormal skin rashes Lymphatics: Denies new lymphadenopathy or easy bruising Neurological:Denies numbness, tingling or new weaknesses Behavioral/Psych: Mood is stable, no new changes  All other systems were reviewed with the patient and are negative.  PHYSICAL EXAMINATION: ECOG PERFORMANCE STATUS: 2 - Symptomatic, <50% confined to bed  Filed Vitals:   11/17/14 1212  BP: 106/67  Pulse: 89  Temp: 97.1 F (36.2 C)  Resp: 20   Filed Weights   11/17/14 1212  Weight: 130 lb 1.6 oz (59.013 kg)    GENERAL:alert, no distress and comfortable SKIN: skin color, texture, turgor are normal, no rashes or significant lesions EYES: normal, conjunctiva are pink and non-injected, sclera clear OROPHARYNX:no exudate, no erythema and  lips, buccal mucosa, and tongue normal  NECK: supple, thyroid normal size, non-tender, without nodularity LYMPH:  no palpable lymphadenopathy in the cervical, axillary or inguinal LUNGS: clear to auscultation and percussion with normal breathing effort HEART: regular rate & rhythm and no murmurs and no lower extremity edema ABDOMEN:abdomen soft, non-tender and normal bowel sounds Musculoskeletal:no cyanosis of digits and no clubbing  PSYCH: alert & oriented x 3 with fluent speech NEURO: no focal motor/sensory deficits  LABORATORY DATA:  I have reviewed the data as listed CBC Latest Ref Rng 11/17/2014 11/10/2014 11/07/2014  WBC 3.9 - 10.3 10e3/uL 7.5 5.9 5.1  Hemoglobin 11.6 - 15.9 g/dL 10.2(L) 11.0(L) 11.1(L)  Hematocrit 34.8 - 46.6 % 31.0(L) 33.4(L) 34.7(L)  Platelets 145 - 400 10e3/uL 293 145 131(L)    CMP Latest  Ref Rng 11/17/2014 11/16/2014 11/10/2014  Glucose 70 - 140 mg/dl 133 74 102  BUN 7.0 - 26.0 mg/dL 10.7 10 11.2  Creatinine 0.6 - 1.1 mg/dL 0.6 0.43(L) 0.6  Sodium 136 - 145 mEq/L 138 138 138  Potassium 3.5 - 5.1 mEq/L 4.0 3.8 3.9  Chloride 101 - 111 mmol/L - 105 -  CO2 22 - 29 mEq/L _0 Calcium 8.4 - 10.4 mg/dL 8.4 8.2(L) 8.8  Total Protein 6.4 - 8.3 g/dL 6.2(L) 5.3(L) 7.1  Total Bilirubin 0.20 - 1.20 mg/dL 0.37 0.6 0.34  Alkaline Phos 40 - 150 U/L 185(H) 132(H) 174(H)  AST 5 - 34 U/L 42(H) 33 64(H)  ALT 0 - 55 U/L 28 25 43     SPEP M-protein  09/15/2014: 4.2 10/08/14: 4.6  IgG 11/03/2014: 3150 mg/dl   24 h urine UPEP/IFE and light chain: 11/03/2014: IFE showed a monoclonal IgG heavy chain with associated lambda light chain. M protein was Undetectable    PATHOLOGY REPORT  Bone Marrow (BM) and Peripheral Blood (PB) 10/10/2014 [facility])  FINAL PATHOLOGIC DIAGNOSIS      BONE MARROW AND PERIPHERAL BLOOD:  Extensive involvement by plasma cell myeloma/leukemia.  See comment and CBC data.      COMMENT:  The bone marrow aspirate smears are spicular, cellular and  adequate for evaluation. Atypical plasma cells comprise 57% of  the cellularity (500 cell manual differential count). The plasma  cells have enlarged nuclei with scattered binucleated forms and  irregular nuclear contour. There is diminished multilineage  hematopoiesis with adequate maturation. Blasts are not increased  in numbers (less than 1% by manual differential counts). 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 There is no overt dysplasia of the  myeloid or erythroid lineages. Megakaryocytes are quantitatively  and qualitatively unremarkable. There is no lymphocytosis.    Examination of the bone marrow core biopsy reveals a markedly  hypercellular marrow for age (95%) with near complete replacement  by sheets of  plasma cells. Rare myeloid and erythroid elements  are present. Megakaryocytes are sparse and focally clustered with  hypolobated and hyperchromatic nuclei. There are no atypical  lymphoid aggregates or granulomas identified. Examination of the  bone marrow clot section reveals similar findings.    The peripheral blood demonstrates a leukocytosis comprised of  large plasma cells and normocytic anemia. Rouleaux is present.  There is a myeloid left shift with rare circulating blasts.    By flow cytometry on the peripheral blood, the abnormal plasma  cell population comprises 64% of the cellular elements and  expresses cytoplasmic CD138, CD45 and cLambda with dim expression  of CD19.  The plasma cells are negative for surface CD2, CD3, CD4,  CD5, CD7, CD8, CD56, CD10, CD20, sKappa, sLambda, HLA-DR and  cKappa.    Overall, the findings demonstrate extensive involvement by plasma  cell myeloma/leukemia.     RADIOGRAPHIC STUDIES: I have personally reviewed the radiological images as listed and agreed with the findings in the report. Dg Abd 1 View  11/10/2014  CLINICAL DATA:  Abdominal pain, history of plasma cell leukemia EXAM: ABDOMEN - 1 VIEW COMPARISON:  None. FINDINGS: Scattered large and small bowel gas is noted. Fecal material is noted throughout the colon consistent with a mild degree of constipation. No obstructive changes are seen. No bony abnormality is noted. Multiple injection granulomas are seen. No free air is noted. IMPRESSION: Nonspecific abdomen. Electronically Signed   By: Inez Catalina M.D.   On: 11/10/2014 12:56    ASSESSMENT & PLAN: 44 year old [place], presented with anemia and leokocytosis   1. Acute plasma cell leukemia -She has started cyborD at [facility] in early Sep 2016 -I spoke with her leukemia program coordinator, and clarified her chemotherapy planning. I'll start her Velcade injection 1.5 mg/m, and dexamethasone 40 mg,  twice a week (on  Tuesday and Friday), and IV Cytoxan 300 mg/m once a week (on Friday), for 4 weeks (she had two weeks treatment), then change to CyBorD weekly from cycle 2.  -She is being followed by Dr. 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 -continue acyclovir when she is on Velcade -Continue allopurinol -She has had good response to induction chemotherapy. Her white blood count has come down to normal. -Lab reviewed, adequate for treatment, she'll start cycle 2 weekly Velcade and Cytoxan today.  2. Anorexia, constipation, epigastric pain -Likely secondary to chemotherapy -I encouraged her to use nutrition supplement, such as Ensure or boost -Follow up with the dietitian -I told her to use over-the-counter laxatives, such as senna S for constipation -She has some Maalox at home, I told her to okay to use for abdominal bloating. -She had EGD this week, which was negative. -She'll continune omeprazole once daily  3. Chronic Hepatitis B  -She was started on entecavir at [facility], we'll continue. -Her liver function is normal, she will follow up with [facility]  4. Normocytic anemia -Likely secondary to her acute plasma cell leukemia -Hemoglobin stable, no need for transfusion. -We'll monitor closely.  5. Communication and Social support -She does not speak English, she is accompanied by interpreter to our cancer center -She has significant communication barrier, limited social support, and poor health literacy  -I sent her to our financial counselor to see if she will be qualified for our cancer center financial assistance for her medications.  Plan -C2D1 CyBorD today, and continue weekly -I'll see her back in 2 weeks  All questions were answered. The patient knows to call the clinic with any problems, questions or concerns. I spent 20 minutes counseling the patient face to face. The total time spent in the appointment was 30 minutes and more than 50% was on  counseling.     Truitt Merle, MD 11/17/2014 4:37 PM

## 2014-11-17 NOTE — Telephone Encounter (Signed)
Per staff message and POF I have scheduled appts. Advised scheduler of appts. JMW  

## 2014-11-17 NOTE — Progress Notes (Signed)
Pt needs assistance paying for her oral meds.  She would like to apply for the [employer].  Pt is not working right now so she will bring in a letter of support from her daughter.  Once received she will be approved for the grant.

## 2014-11-24 ENCOUNTER — Other Ambulatory Visit: Payer: 59

## 2014-11-24 ENCOUNTER — Ambulatory Visit (HOSPITAL_BASED_OUTPATIENT_CLINIC_OR_DEPARTMENT_OTHER): Payer: 59

## 2014-11-24 ENCOUNTER — Ambulatory Visit: Payer: 59

## 2014-11-24 ENCOUNTER — Other Ambulatory Visit (HOSPITAL_BASED_OUTPATIENT_CLINIC_OR_DEPARTMENT_OTHER): Payer: 59

## 2014-11-24 DIAGNOSIS — Z95828 Presence of other vascular implants and grafts: Secondary | ICD-10-CM

## 2014-11-24 DIAGNOSIS — Z5111 Encounter for antineoplastic chemotherapy: Secondary | ICD-10-CM | POA: Diagnosis not present

## 2014-11-24 DIAGNOSIS — Z5112 Encounter for antineoplastic immunotherapy: Secondary | ICD-10-CM | POA: Diagnosis not present

## 2014-11-24 DIAGNOSIS — C9010 Plasma cell leukemia not having achieved remission: Secondary | ICD-10-CM

## 2014-11-24 LAB — COMPREHENSIVE METABOLIC PANEL (CC13)
ALK PHOS: 223 U/L — AB (ref 40–150)
ALT: 29 U/L (ref 0–55)
ANION GAP: 6 meq/L (ref 3–11)
AST: 49 U/L — ABNORMAL HIGH (ref 5–34)
Albumin: 3 g/dL — ABNORMAL LOW (ref 3.5–5.0)
BUN: 9.8 mg/dL (ref 7.0–26.0)
CHLORIDE: 105 meq/L (ref 98–109)
CO2: 26 meq/L (ref 22–29)
CREATININE: 0.6 mg/dL (ref 0.6–1.1)
Calcium: 8.6 mg/dL (ref 8.4–10.4)
Glucose: 119 mg/dl (ref 70–140)
POTASSIUM: 3.9 meq/L (ref 3.5–5.1)
Sodium: 137 mEq/L (ref 136–145)
Total Protein: 6.1 g/dL — ABNORMAL LOW (ref 6.4–8.3)

## 2014-11-24 LAB — CBC WITH DIFFERENTIAL/PLATELET
BASO%: 0.2 % (ref 0.0–2.0)
Basophils Absolute: 0 10*3/uL (ref 0.0–0.1)
EOS%: 0.1 % (ref 0.0–7.0)
Eosinophils Absolute: 0 10*3/uL (ref 0.0–0.5)
HCT: 32.9 % — ABNORMAL LOW (ref 34.8–46.6)
HGB: 10.6 g/dL — ABNORMAL LOW (ref 11.6–15.9)
LYMPH%: 6.7 % — AB (ref 14.0–49.7)
MCH: 30.2 pg (ref 25.1–34.0)
MCHC: 32.1 g/dL (ref 31.5–36.0)
MCV: 94 fL (ref 79.5–101.0)
MONO#: 0.2 10*3/uL (ref 0.1–0.9)
MONO%: 4 % (ref 0.0–14.0)
NEUT#: 5.2 10*3/uL (ref 1.5–6.5)
NEUT%: 89 % — AB (ref 38.4–76.8)
PLATELETS: 244 10*3/uL (ref 145–400)
RBC: 3.5 10*6/uL — AB (ref 3.70–5.45)
RDW: 19.6 % — ABNORMAL HIGH (ref 11.2–14.5)
WBC: 5.9 10*3/uL (ref 3.9–10.3)
lymph#: 0.4 10*3/uL — ABNORMAL LOW (ref 0.9–3.3)

## 2014-11-24 LAB — MAGNESIUM (CC13): MAGNESIUM: 1.9 mg/dL (ref 1.5–2.5)

## 2014-11-24 MED ORDER — BORTEZOMIB CHEMO SQ INJECTION 3.5 MG (2.5MG/ML)
1.5000 mg/m2 | Freq: Once | INTRAMUSCULAR | Status: AC
  Administered 2014-11-24: 2.5 mg via SUBCUTANEOUS
  Filled 2014-11-24: qty 2.5

## 2014-11-24 MED ORDER — SODIUM CHLORIDE 0.9 % IV SOLN
300.0000 mg/m2 | Freq: Once | INTRAVENOUS | Status: AC
  Administered 2014-11-24: 480 mg via INTRAVENOUS
  Filled 2014-11-24: qty 24

## 2014-11-24 MED ORDER — SODIUM CHLORIDE 0.9 % IJ SOLN
10.0000 mL | INTRAMUSCULAR | Status: DC | PRN
  Administered 2014-11-24: 10 mL via INTRAVENOUS
  Filled 2014-11-24: qty 10

## 2014-11-24 MED ORDER — ONDANSETRON HCL 8 MG PO TABS
ORAL_TABLET | ORAL | Status: AC
  Filled 2014-11-24: qty 1

## 2014-11-24 MED ORDER — HEPARIN SOD (PORK) LOCK FLUSH 100 UNIT/ML IV SOLN
500.0000 [IU] | Freq: Once | INTRAVENOUS | Status: DC
  Administered 2014-11-24: 500 [IU] via INTRAVENOUS
  Filled 2014-11-24: qty 5

## 2014-11-24 MED ORDER — ONDANSETRON HCL 8 MG PO TABS
8.0000 mg | ORAL_TABLET | Freq: Once | ORAL | Status: AC
  Administered 2014-11-24: 8 mg via ORAL

## 2014-11-30 ENCOUNTER — Encounter: Payer: Self-pay | Admitting: Pharmacist

## 2014-12-01 ENCOUNTER — Ambulatory Visit: Payer: 59

## 2014-12-01 ENCOUNTER — Other Ambulatory Visit: Payer: 59

## 2014-12-01 ENCOUNTER — Ambulatory Visit (HOSPITAL_BASED_OUTPATIENT_CLINIC_OR_DEPARTMENT_OTHER): Payer: 59

## 2014-12-01 ENCOUNTER — Ambulatory Visit (HOSPITAL_BASED_OUTPATIENT_CLINIC_OR_DEPARTMENT_OTHER): Payer: 59 | Admitting: Hematology

## 2014-12-01 ENCOUNTER — Encounter: Payer: Self-pay | Admitting: Hematology

## 2014-12-01 ENCOUNTER — Ambulatory Visit (HOSPITAL_COMMUNITY)
Admission: RE | Admit: 2014-12-01 | Discharge: 2014-12-01 | Disposition: A | Discharge status: Home / Self Care | Payer: 59 | Source: Ambulatory Visit | Attending: Hematology | Admitting: Hematology

## 2014-12-01 ENCOUNTER — Other Ambulatory Visit (HOSPITAL_BASED_OUTPATIENT_CLINIC_OR_DEPARTMENT_OTHER): Payer: 59

## 2014-12-01 ENCOUNTER — Telehealth: Payer: Self-pay | Admitting: Hematology

## 2014-12-01 VITALS — BP 98/62 | HR 98 | Temp 98.3°F | Resp 18 | Ht 63.0 in | Wt 127.3 lb

## 2014-12-01 DIAGNOSIS — R630 Anorexia: Secondary | ICD-10-CM

## 2014-12-01 DIAGNOSIS — Z5112 Encounter for antineoplastic immunotherapy: Secondary | ICD-10-CM | POA: Diagnosis not present

## 2014-12-01 DIAGNOSIS — R1013 Epigastric pain: Secondary | ICD-10-CM | POA: Diagnosis not present

## 2014-12-01 DIAGNOSIS — Z5111 Encounter for antineoplastic chemotherapy: Secondary | ICD-10-CM

## 2014-12-01 DIAGNOSIS — C9010 Plasma cell leukemia not having achieved remission: Secondary | ICD-10-CM

## 2014-12-01 DIAGNOSIS — R918 Other nonspecific abnormal finding of lung field: Secondary | ICD-10-CM | POA: Diagnosis not present

## 2014-12-01 DIAGNOSIS — R05 Cough: Secondary | ICD-10-CM

## 2014-12-01 DIAGNOSIS — B181 Chronic viral hepatitis B without delta-agent: Secondary | ICD-10-CM

## 2014-12-01 DIAGNOSIS — K5900 Constipation, unspecified: Secondary | ICD-10-CM

## 2014-12-01 DIAGNOSIS — D649 Anemia, unspecified: Secondary | ICD-10-CM

## 2014-12-01 LAB — COMPREHENSIVE METABOLIC PANEL (CC13)
ALT: 11 U/L (ref 0–55)
ANION GAP: 8 meq/L (ref 3–11)
AST: 24 U/L (ref 5–34)
Albumin: 3 g/dL — ABNORMAL LOW (ref 3.5–5.0)
Alkaline Phosphatase: 200 U/L — ABNORMAL HIGH (ref 40–150)
BUN: 7 mg/dL (ref 7.0–26.0)
CHLORIDE: 108 meq/L (ref 98–109)
CO2: 24 meq/L (ref 22–29)
Calcium: 8.9 mg/dL (ref 8.4–10.4)
Creatinine: 0.6 mg/dL (ref 0.6–1.1)
Glucose: 131 mg/dl (ref 70–140)
Potassium: 3.9 mEq/L (ref 3.5–5.1)
Sodium: 140 mEq/L (ref 136–145)
Total Protein: 6.4 g/dL (ref 6.4–8.3)

## 2014-12-01 LAB — CBC WITH DIFFERENTIAL/PLATELET
BASO%: 0.2 % (ref 0.0–2.0)
Basophils Absolute: 0 10*3/uL (ref 0.0–0.1)
EOS%: 0.2 % (ref 0.0–7.0)
Eosinophils Absolute: 0 10*3/uL (ref 0.0–0.5)
HCT: 34.5 % — ABNORMAL LOW (ref 34.8–46.6)
HGB: 11.4 g/dL — ABNORMAL LOW (ref 11.6–15.9)
LYMPH%: 6.4 % — AB (ref 14.0–49.7)
MCH: 30.4 pg (ref 25.1–34.0)
MCHC: 32.9 g/dL (ref 31.5–36.0)
MCV: 92.4 fL (ref 79.5–101.0)
MONO#: 0.1 10*3/uL (ref 0.1–0.9)
MONO%: 2.1 % (ref 0.0–14.0)
NEUT#: 5.4 10*3/uL (ref 1.5–6.5)
NEUT%: 91.1 % — AB (ref 38.4–76.8)
PLATELETS: 268 10*3/uL (ref 145–400)
RBC: 3.73 10*6/uL (ref 3.70–5.45)
RDW: 18.5 % — AB (ref 11.2–14.5)
WBC: 5.9 10*3/uL (ref 3.9–10.3)
lymph#: 0.4 10*3/uL — ABNORMAL LOW (ref 0.9–3.3)

## 2014-12-01 LAB — MAGNESIUM (CC13): Magnesium: 1.7 mg/dl (ref 1.5–2.5)

## 2014-12-01 MED ORDER — ONDANSETRON HCL 8 MG PO TABS
8.0000 mg | ORAL_TABLET | Freq: Once | ORAL | Status: AC
  Administered 2014-12-01: 8 mg via ORAL

## 2014-12-01 MED ORDER — ONDANSETRON HCL 8 MG PO TABS
ORAL_TABLET | ORAL | Status: AC
  Filled 2014-12-01: qty 1

## 2014-12-01 MED ORDER — SODIUM CHLORIDE 0.9 % IV SOLN
300.0000 mg/m2 | Freq: Once | INTRAVENOUS | Status: AC
  Administered 2014-12-01: 480 mg via INTRAVENOUS
  Filled 2014-12-01: qty 24

## 2014-12-01 MED ORDER — HEPARIN SOD (PORK) LOCK FLUSH 100 UNIT/ML IV SOLN
500.0000 [IU] | Freq: Once | INTRAVENOUS | Status: AC
  Administered 2014-12-01: 500 [IU] via INTRAVENOUS
  Filled 2014-12-01: qty 5

## 2014-12-01 MED ORDER — BORTEZOMIB CHEMO SQ INJECTION 3.5 MG (2.5MG/ML)
1.5000 mg/m2 | Freq: Once | INTRAMUSCULAR | Status: AC
  Administered 2014-12-01: 2.5 mg via SUBCUTANEOUS
  Filled 2014-12-01: qty 2.5

## 2014-12-01 MED ORDER — SODIUM CHLORIDE 0.9 % IJ SOLN
10.0000 mL | INTRAMUSCULAR | Status: DC | PRN
  Administered 2014-12-01: 10 mL via INTRAVENOUS
  Filled 2014-12-01: qty 10

## 2014-12-01 NOTE — Progress Notes (Signed)
[place]  [phone] [phone]  Clinic Follow up Note   Patient Care Team: Truitt Merle, MD as PCP - General (Hematology) [name], MD as Referring Physician (Specialist) [name], MD as Referring Physician (Specialist) 12/01/2014    CHIEF COMPLAINTS:  Follow up acute plasma cell leukemia   HISTORY OF PRESENTING ILLNESS:  Sheryl [name] 44 y.o. female is here because of recently diagnosed plasma cell leukemia. She was recently discharged form [facility] 3 days ago and is here to establish her local oncological care with [place]. She is a [place], does not speak [place], she is accompanied to the clinic by her daughter and interpreter.  She presented to our hospital lth with worsening dyspnea, fatigue, cough, subjective fevers and chills, and was admitted on 09/14/2014. She was found to have allergy WBC 54.1K, hemoglobin 8.1, plt 310, she was treated with symptom management, and was discharged home on 09/17/2014, with a plan to follow-up with hematology. She represents to emergency room on 10/07/2014, was found to have white count of 78K, and worsening anemia with hemoglobin 6. She was seen by my partner Dr. Jonette Eva and plasma cell leukemia was suspected. She was transferred to [facility] for further leukemia work-up and treatment. Bone marrow biopsy was done, which confirmed plasma cell leukemia, and she started chemotherapy with Velcade, Cytoxan and dexamethasone. She received weekly dose twice, last dose on 10/20/2014. She was discharged to home afterwards. She had a mediport placed during her hospitalization.  She has moderate fatigue, low appetie, she lost about 13 lbs in the past few months. She has moderate abdominal pain, 5-6/10, persistent, she does not take any pain meds.   I reviewed her medical records extensively, and discussed her case with Dr. Jorje Guild and [place] program coordinator.  INTERIM HISTORY: Clotine  returns for follow up and cycle 2 D15 CyborD. She is doing moderately well. She reports feeling warm, with a mild cough and yellowish sputum production in the past for 5 days, she states her temperature was up from 98 at home. She is afebrile here today. No dyspnea or chest pain. Her epigastric pain has resolved, she has intermittent right upper quadrant abdominal discomfort, mild, the Palmer is normal, appetite is moderate to low, she eats small meals, her weight has been stable. She has been tolerating chemotherapy well, no significant nausea or other side effects, except some fatigue.   MEDICAL HISTORY:  Past Medical History  Diagnosis Date  . GERD (gastroesophageal reflux disease)   . Hepatitis   . History of positive PPD     DX 2011--  CXR DONE NO EVIDENCE  . Hydronephrosis, right   . Right ureteral stone   . History of ureter stent   . Chills with fever     intermittently since d/c from hospital  . Dysuria-frequency syndrome     w/ pink urine  . Urosepsis 8/14    admitted to [facility]  . Pneumonia   . Neuromuscular disorder (HCC)     legs numb intermittently    SURGICAL HISTORY: Past Surgical History  Procedure Laterality Date  . Cystoscopy w/ ureteral stent placement Right 09/25/2012    Procedure: CYSTOSCOPY WITH RETROGRADE PYELOGRAM/URETERAL STENT PLACEMENT;  Surgeon: Alexis Frock, MD;  Location: [facility];  Service: Urology;  Laterality: Right;  . Liver biopsy    . Right vats w/ drainage peural effusion and bx's  10-30-2008  . Removal of uterine cyst      years ago  .  Other surgical history Right     removal of ovarian cyst  . Cystoscopy with retrograde pyelogram, ureteroscopy and stent placement Right 10/15/2012    Procedure: CYSTOSCOPY WITH RETROGRADE PYELOGRAM, URETEROSCOPY AND REMOVAL STENT WITH  STENT PLACEMENT;  Surgeon: Alexis Frock, MD;  Location: [facility];  Service: Urology;  Laterality: Right;  . Holmium laser application Right 9/35/7017     Procedure: HOLMIUM LASER APPLICATION;  Surgeon: Alexis Frock, MD;  Location: [facility];  Service: Urology;  Laterality: Right;  . Esophagogastroduodenoscopy (egd) with propofol N/A 11/16/2014    Procedure: ESOPHAGOGASTRODUODENOSCOPY (EGD) WITH PROPOFOL;  Surgeon: Milus Banister, MD;  Location: [facility];  Service: Endoscopy;  Laterality: N/A;    SOCIAL HISTORY: Social History   Social History  . Marital Status: Single    Spouse Name: N/A  . Number of Children: 15   . Years of Education: N/A   Occupational History  . Not on file.   Social History Main Topics  . Smoking status: Never Smoker   . Smokeless tobacco: Not on file  . Alcohol Use: No  . Drug Use: No  . Sexual Activity: Not Currently    Birth Control/ Protection: Abstinence   Other Topics Concern  . Not on file   Social History Narrative    FAMILY HISTORY: Family History  Problem Relation Age of Onset  . Stomach cancer Mother   . Lung disease Father   . Asthma Father     ALLERGIES:  has No Known Allergies.  MEDICATIONS:  Current Outpatient Prescriptions  Medication Sig Dispense Refill  . acyclovir (ZOVIRAX) 400 MG tablet Take 1 tablet (400 mg total) by mouth 2 (two) times daily. 60 tablet 3  . dexamethasone (DECADRON) 4 MG tablet Take 10 tablets (40 mg total) by mouth once a week. On the day of chemo treatment 40 tablet 2  . entecavir (BARACLUDE) 0.5 MG tablet Take 0.5 mg by mouth daily.     Marland Kitchen lidocaine-prilocaine (EMLA) cream Apply 1 application topically as needed. (Patient taking differently: Apply 1 application topically as needed (port). ) 30 g 6  . omeprazole (PRILOSEC) 20 MG capsule Take 1 capsule (20 mg total) by mouth daily. 30 capsule 2  . polyethylene glycol (MIRALAX / GLYCOLAX) packet Take 17 g by mouth as needed for moderate constipation.      No current facility-administered medications for this visit.    REVIEW OF SYSTEMS:   Constitutional: Denies fevers, chills or  abnormal night sweats Eyes: Denies blurriness of vision, double vision or watery eyes Ears, nose, mouth, throat, and face: Denies mucositis or sore throat Respiratory: Denies cough, dyspnea or wheezes Cardiovascular: Denies palpitation, chest discomfort or lower extremity swelling Gastrointestinal:  Denies nausea, heartburn or change in bowel habits Skin: Denies abnormal skin rashes Lymphatics: Denies new lymphadenopathy or easy bruising Neurological:Denies numbness, tingling or new weaknesses Behavioral/Psych: Mood is stable, no new changes  All other systems were reviewed with the patient and are negative.  PHYSICAL EXAMINATION: ECOG PERFORMANCE STATUS: 2 - Symptomatic, <50% confined to bed  Filed Vitals:   12/01/14 1113  BP: 98/62  Pulse: 98  Temp: 98.3 F (36.8 C)  Resp: 18   Filed Weights   12/01/14 1113  Weight: 127 lb 4.8 oz (57.743 kg)    GENERAL:alert, no distress and comfortable SKIN: skin color, texture, turgor are normal, no rashes or significant lesions EYES: normal, conjunctiva are pink and non-injected, sclera clear OROPHARYNX:no exudate, no erythema and lips, buccal mucosa,  and tongue normal  NECK: supple, thyroid normal size, non-tender, without nodularity LYMPH:  no palpable lymphadenopathy in the cervical, axillary or inguinal LUNGS: clear to auscultation and percussion with normal breathing effort HEART: regular rate & rhythm and no murmurs and no lower extremity edema ABDOMEN:abdomen soft, non-tender and normal bowel sounds Musculoskeletal:no cyanosis of digits and no clubbing  PSYCH: alert & oriented x 3 with fluent speech NEURO: no focal motor/sensory deficits  LABORATORY DATA:  I have reviewed the data as listed CBC Latest Ref Rng 12/01/2014 11/24/2014 11/17/2014  WBC 3.9 - 10.3 10e3/uL 5.9 5.9 7.5  Hemoglobin 11.6 - 15.9 g/dL 11.4(L) 10.6(L) 10.2(L)  Hematocrit 34.8 - 46.6 % 34.5(L) 32.9(L) 31.0(L)  Platelets 145 - 400 10e3/uL 268 244 293     CMP Latest Ref Rng 12/01/2014 11/24/2014 11/17/2014  Glucose 70 - 140 mg/dl 131 119 133  BUN 7.0 - 26.0 mg/dL 7.0 9.8 10.7  Creatinine 0.6 - 1.1 mg/dL 0.6 0.6 0.6  Sodium 136 - 145 mEq/L 140 137 138  Potassium 3.5 - 5.1 mEq/L 3.9 3.9 4.0  Chloride 101 - 111 mmol/L - - -  CO2 22 - 29 mEq/L '24 26 24  ' Calcium 8.4 - 10.4 mg/dL 8.9 8.6 8.4  Total Protein 6.4 - 8.3 g/dL 6.4 6.1(L) 6.2(L)  Total Bilirubin 0.20 - 1.20 mg/dL <0.30 <0.30 0.37  Alkaline Phos 40 - 150 U/L 200(H) 223(H) 185(H)  AST 5 - 34 U/L 24 49(H) 42(H)  ALT 0 - 55 U/L '11 29 28     ' SPEP M-protein  09/15/2014: 4.2 10/08/14: 4.6  IgG 11/03/2014: 3150 mg/dl   24 h urine UPEP/IFE and light chain: 11/03/2014: IFE showed a monoclonal IgG heavy chain with associated lambda light chain. M protein was Undetectable    PATHOLOGY REPORT  Bone Marrow (BM) and Peripheral Blood (PB) 10/10/2014 [facility])  FINAL PATHOLOGIC DIAGNOSIS      BONE MARROW AND PERIPHERAL BLOOD:  Extensive involvement by plasma cell myeloma/leukemia.  See comment and CBC data.      COMMENT:  The bone marrow aspirate smears are spicular, cellular and  adequate for evaluation. Atypical plasma cells comprise 57% of  the cellularity (500 cell manual differential count). The plasma  cells have enlarged nuclei with scattered binucleated forms and  irregular nuclear contour. There is diminished multilineage  hematopoiesis with adequate maturation. Blasts are not increased  in numbers (less than 1% by manual differential counts). 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 There is no overt dysplasia of the  myeloid or erythroid lineages. Megakaryocytes are quantitatively  and qualitatively unremarkable. There is no lymphocytosis.    Examination of the bone marrow core biopsy reveals a markedly  hypercellular marrow for age (95%) with near complete replacement  by  sheets of plasma cells. Rare myeloid and erythroid elements  are present. Megakaryocytes are sparse and focally clustered with  hypolobated and hyperchromatic nuclei. There are no atypical  lymphoid aggregates or granulomas identified. Examination of the  bone marrow clot section reveals similar findings.    The peripheral blood demonstrates a leukocytosis comprised of  large plasma cells and normocytic anemia. Rouleaux is present.  There is a myeloid left shift with rare circulating blasts.    By flow cytometry on the peripheral blood, the abnormal plasma  cell population comprises 64% of the cellular elements and  expresses cytoplasmic CD138, CD45 and cLambda with dim expression  of CD19. The plasma cells  are negative for surface CD2, CD3, CD4,  CD5, CD7, CD8, CD56, CD10, CD20, sKappa, sLambda, HLA-DR and  cKappa.    Overall, the findings demonstrate extensive involvement by plasma  cell myeloma/leukemia.     RADIOGRAPHIC STUDIES: I have personally reviewed the radiological images as listed and agreed with the findings in the report. Dg Chest 2 View  12/01/2014  CLINICAL DATA:  Plasma cell leukemia EXAM: CHEST  2 VIEW COMPARISON:  10/07/2014 FINDINGS: Cardiomediastinal silhouette is stable. Central mild bronchitic changes. Stable streaky bilateral basilar atelectasis or scarring. No pulmonary edema. No segmental infiltrate. Right IJ Port-A-Cath with tip in SVC right atrium junction. IMPRESSION: Central mild bronchitic changes. Stable streaky bilateral basilar atelectasis or scarring. No pulmonary edema. No segmental infiltrate Electronically Signed   By: Lahoma Crocker M.D.   On: 12/01/2014 15:56   Dg Abd 1 View  11/10/2014  CLINICAL DATA:  Abdominal pain, history of plasma cell leukemia EXAM: ABDOMEN - 1 VIEW COMPARISON:  None. FINDINGS: Scattered large and small bowel gas is noted. Fecal material is noted throughout the colon consistent with a mild degree of  constipation. No obstructive changes are seen. No bony abnormality is noted. Multiple injection granulomas are seen. No free air is noted. IMPRESSION: Nonspecific abdomen. Electronically Signed   By: Inez Catalina M.D.   On: 11/10/2014 12:56    ASSESSMENT & PLAN: 44 year old [place], presented with anemia and leokocytosis   1. Acute plasma cell leukemia -She has started cyborD at [facility] in early Sep 2016 -She is on second cycle CyBorD (weekly) now, tolerating well. -She is being followed by Dr. 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 Her next appointment is in early December -continue acyclovir when she is on Velcade -Continue allopurinol -She has had good response to induction chemotherapy. Her white blood count has come down to normal. -Lab reviewed, adequate for treatment, she'll proceed cycle 2 day 15 treatment today.  2. Anorexia, constipation, epigastric pain -Likely secondary to chemotherapy, her symptoms has improved lately. -I encouraged her to use nutrition supplement, such as Ensure or boost -Follow up with the dietitian -EGD was negative  3. Mild productive cough -She states she had fever at home, but temperature was normal at 31F, no clinical signs of sepsis. Her white count is normal. -I ordered chest x-ray today, and blood culture today. -Her chest x-ray returns normal. I'll hold on antibiotics 20  3. Chronic Hepatitis B  -She was started on entecavir at [facility], we'll continue. -Her liver function is normal, she will follow up with [facility]  4. Normocytic anemia -Likely secondary to her acute plasma cell leukemia -Hemoglobin stable, no need for transfusion. -We'll monitor closely.  5. Communication and Social support -She does not speak English, she is accompanied by interpreter to our cancer center -She has significant communication barrier, limited social support, and poor health literacy  -I sent her to our financial  counselor to see if she will be qualified for our cancer center financial assistance for her medications.  Plan -C2D15 CyBorD today, and continue weekly -I'll see her back next week for follow up. Will follow her blood cultures   All questions were answered. The patient knows to call the clinic with any problems, questions or concerns. I spent 20 minutes counseling the patient face to face. The total time spent in the appointment was 30 minutes and more than 50% was on counseling.     Truitt Merle, MD 12/01/2014 5:55 PM

## 2014-12-01 NOTE — Telephone Encounter (Signed)
perp of to sch pt appt-MW changed trmt time-gave pt updated copy of avs

## 2014-12-07 LAB — CULTURE, BLOOD (SINGLE)

## 2014-12-08 ENCOUNTER — Telehealth: Payer: Self-pay | Admitting: *Deleted

## 2014-12-08 ENCOUNTER — Ambulatory Visit (HOSPITAL_BASED_OUTPATIENT_CLINIC_OR_DEPARTMENT_OTHER): Payer: 59 | Admitting: Hematology

## 2014-12-08 ENCOUNTER — Other Ambulatory Visit (HOSPITAL_BASED_OUTPATIENT_CLINIC_OR_DEPARTMENT_OTHER): Payer: 59

## 2014-12-08 ENCOUNTER — Encounter: Payer: Self-pay | Admitting: Hematology

## 2014-12-08 ENCOUNTER — Ambulatory Visit (HOSPITAL_BASED_OUTPATIENT_CLINIC_OR_DEPARTMENT_OTHER): Payer: 59

## 2014-12-08 ENCOUNTER — Telehealth: Payer: Self-pay | Admitting: Hematology

## 2014-12-08 ENCOUNTER — Other Ambulatory Visit: Payer: 59

## 2014-12-08 VITALS — BP 107/65 | HR 101 | Temp 98.6°F | Resp 18 | Ht 63.0 in | Wt 132.1 lb

## 2014-12-08 DIAGNOSIS — C9010 Plasma cell leukemia not having achieved remission: Secondary | ICD-10-CM

## 2014-12-08 DIAGNOSIS — Z5112 Encounter for antineoplastic immunotherapy: Secondary | ICD-10-CM

## 2014-12-08 DIAGNOSIS — R109 Unspecified abdominal pain: Secondary | ICD-10-CM

## 2014-12-08 DIAGNOSIS — K5900 Constipation, unspecified: Secondary | ICD-10-CM | POA: Diagnosis not present

## 2014-12-08 DIAGNOSIS — B1920 Unspecified viral hepatitis C without hepatic coma: Secondary | ICD-10-CM

## 2014-12-08 DIAGNOSIS — R630 Anorexia: Secondary | ICD-10-CM

## 2014-12-08 DIAGNOSIS — B181 Chronic viral hepatitis B without delta-agent: Secondary | ICD-10-CM

## 2014-12-08 DIAGNOSIS — R05 Cough: Secondary | ICD-10-CM

## 2014-12-08 DIAGNOSIS — D649 Anemia, unspecified: Secondary | ICD-10-CM

## 2014-12-08 LAB — COMPREHENSIVE METABOLIC PANEL (CC13)
ALBUMIN: 3.2 g/dL — AB (ref 3.5–5.0)
ALK PHOS: 195 U/L — AB (ref 40–150)
ALT: <9 U/L (ref 0–55)
ANION GAP: 7 meq/L (ref 3–11)
AST: 23 U/L (ref 5–34)
BUN: 9 mg/dL (ref 7.0–26.0)
CALCIUM: 9.4 mg/dL (ref 8.4–10.4)
CO2: 26 mEq/L (ref 22–29)
Chloride: 110 mEq/L — ABNORMAL HIGH (ref 98–109)
Creatinine: 0.6 mg/dL (ref 0.6–1.1)
Glucose: 106 mg/dl (ref 70–140)
POTASSIUM: 4.3 meq/L (ref 3.5–5.1)
Sodium: 143 mEq/L (ref 136–145)
Total Bilirubin: <0.3 mg/dL (ref 0.20–1.20)
Total Protein: 6.6 g/dL (ref 6.4–8.3)

## 2014-12-08 LAB — CBC WITH DIFFERENTIAL/PLATELET
BASO%: 0.2 % (ref 0.0–2.0)
BASOS ABS: 0 10*3/uL (ref 0.0–0.1)
EOS ABS: 0 10*3/uL (ref 0.0–0.5)
EOS%: 0 % (ref 0.0–7.0)
HEMATOCRIT: 34.8 % (ref 34.8–46.6)
HGB: 11.3 g/dL — ABNORMAL LOW (ref 11.6–15.9)
LYMPH#: 0.4 10*3/uL — AB (ref 0.9–3.3)
LYMPH%: 4.4 % — ABNORMAL LOW (ref 14.0–49.7)
MCH: 29.4 pg (ref 25.1–34.0)
MCHC: 32.4 g/dL (ref 31.5–36.0)
MCV: 90.8 fL (ref 79.5–101.0)
MONO#: 0.1 10*3/uL (ref 0.1–0.9)
MONO%: 1 % (ref 0.0–14.0)
NEUT#: 7.4 10*3/uL — ABNORMAL HIGH (ref 1.5–6.5)
NEUT%: 94.4 % — AB (ref 38.4–76.8)
PLATELETS: 281 10*3/uL (ref 145–400)
RBC: 3.83 10*6/uL (ref 3.70–5.45)
RDW: 17.5 % — ABNORMAL HIGH (ref 11.2–14.5)
WBC: 7.9 10*3/uL (ref 3.9–10.3)

## 2014-12-08 LAB — MAGNESIUM (CC13): Magnesium: 2.1 mg/dl (ref 1.5–2.5)

## 2014-12-08 MED ORDER — ONDANSETRON HCL 8 MG PO TABS
8.0000 mg | ORAL_TABLET | Freq: Once | ORAL | Status: AC
  Administered 2014-12-08: 8 mg via ORAL

## 2014-12-08 MED ORDER — SODIUM CHLORIDE 0.9 % IJ SOLN
10.0000 mL | INTRAMUSCULAR | Status: AC | PRN
  Administered 2014-12-08: 10 mL
  Filled 2014-12-08: qty 10

## 2014-12-08 MED ORDER — ONDANSETRON HCL 8 MG PO TABS
ORAL_TABLET | ORAL | Status: AC
  Filled 2014-12-08: qty 1

## 2014-12-08 MED ORDER — SODIUM CHLORIDE 0.9 % IV SOLN
300.0000 mg/m2 | Freq: Once | INTRAVENOUS | Status: AC
  Administered 2014-12-08: 480 mg via INTRAVENOUS
  Filled 2014-12-08: qty 24

## 2014-12-08 MED ORDER — AZITHROMYCIN 500 MG PO TABS: 500.0000 mg | ORAL_TABLET | Freq: Every day | ORAL | Status: DC

## 2014-12-08 MED ORDER — HEPARIN SOD (PORK) LOCK FLUSH 100 UNIT/ML IV SOLN
500.0000 [IU] | INTRAVENOUS | Status: AC | PRN
  Administered 2014-12-08: 500 [IU]
  Filled 2014-12-08: qty 5

## 2014-12-08 MED ORDER — BORTEZOMIB CHEMO SQ INJECTION 3.5 MG (2.5MG/ML)
1.5000 mg/m2 | Freq: Once | INTRAMUSCULAR | Status: AC
  Administered 2014-12-08: 2.5 mg via SUBCUTANEOUS
  Filled 2014-12-08: qty 2.5

## 2014-12-08 MED ORDER — SODIUM CHLORIDE 0.9 % IV SOLN
INTRAVENOUS | Status: DC
  Administered 2014-12-08: 14:00:00 via INTRAVENOUS

## 2014-12-08 NOTE — Telephone Encounter (Signed)
per pof to sch pt appt-sent MW email to sch pt trmt-pt to get updated sch b4 leaving °

## 2014-12-08 NOTE — Patient Instructions (Signed)
Bortezomib injection ?y l thu?c g? BORTEZOMIB l thu?c nh?m ??n cc protein trong cc t? bo ung th? v lm cho t? bo ung th? ng?ng pht tri?n. Thu?c ???c dng ?? ?i?u tr? b?nh ?a u t?y (multiple myeloma) v b??u b?ch huy?t t? 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 Thu?c ny c th? ???c dng cho nh?ng m?c ?ch khc; hy h?i ng??i cung c?p d?ch v? y t? ho?c d??c s? c?a mnh, n?u qu v? c th?c m?c. Ti c?n ph?i bo cho ng??i cung c?p d?ch v? y t? c?a mnh ?i?u g tr??c khi dng thu?c ny? H? c?n bi?t li?u qu v? c b?t k? tnh tr?ng no sau ?y khng: -b?nh ti?u ???ng -b?nh tim -nh?p tim khng ??u -b?nh gan -?ang ???c th?m phn -s? l??ng t? bo mu th?p, ch?ng h?n nh? s? l??ng b?ch c?u, ti?u c?u, ho?c h?ng c?u th?p -b?nh th?n kinh ngo?i bin -?ang dng thu?c ?i?u tr? huy?t p cao -pha?n ??ng b?t th???ng ho??c di? ??ng v??i bortezomib, mannitol, ho?c boron -pha?n ??ng b?t th???ng ho??c di? ??ng v??i ca?c d??c ph?m kha?c, th?c ph?m, thu?c nhu?m, ho??c ch?t ba?o qua?n -?ang c thai ho??c ??nh co? thai -?ang cho con bu? Ti nn s? d?ng thu?c ny nh? th? no? Thu?c ny ?? tim d??i da ho?c tim vo t?nh m?ch. Thu?c ny ???c s? d?ng b?i chuyn vin y t? ? b?nh vi?n ho?c ? phng m?ch. Hy bn v?i bc s? nhi khoa c?a qu v? v? vi?c dng thu?c ny ? tr? em. C th? c?n ch?m [place] ??c bi?t. Qu li?u: N?u qu v? cho r?ng mnh ? dng qu nhi?u thu?c ny, th hy lin l?c v?i trung tm ki?m sot ch?t ??c ho?c phng c?p c?u ngay l?p t?c. L?U : Thu?c ny ch? dnh ring cho qu v?. Khng chia s? thu?c ny v?i nh?ng ng??i khc. N?u ti l? qun m?t li?u th sao? ?i?u quan tr?ng l khng nn b? l? li?u thu?c no. Hy lin l?c v?i bc s? ho?c [place] vin y t? c?a mnh, n?u qu v? khng th? gi? ?ng cu?c h?n khm. Nh?ng g c th? t??ng tc v?i thu?c ny? Thu?c ny c th? t??ng tc v?i cc thu?c sau ?y: -ketoconazole -rifampicin -ritonavir -cy [facility] (c? [facility]/cy n?c s?i/cy l?nh) Danh  sch ny c th? khng m t? ?? h?t cc t??ng tc c th? x?y ra. Hy ??a cho ng??i cung c?p d?ch v? y t? c?a mnh danh sch t?t c? cc thu?c, th?o d??c, cc thu?c khng c?n toa, ho?c cc ch? ph?m b? sung m qu v? dng. C?ng nn bo cho h? bi?t r?ng qu v? c ht thu?c, u?ng r??u, ho?c c s? d?ng ma ty tri php hay khng. Vi th? c th? t??ng tc v?i thu?c c?a qu v?. Ti c?n ph?i theo di ?i?u g trong khi dng thu?c ny? Hy ?i g?p bc s? ho?c [place] vin y t? ?? theo di ??nh k? s? c?i thi?n c?a qu v?. Thu?c ny c th? lm cho qu v? c?m th?y khng ???c kh?e nh? th??ng l?. ?i?u ny khng ph?i khng ph? bi?n, b?i v thu?c ha tr? li?u c th? ?nh h??ng ??n c? t? bo lnh l?n t? bo ung th?. Hy t??ng trnh m?i tc d?ng ph?. Hy ti?p t?c ??t ?i?u tr? c?a mnh ngay c? khi qu v? c?m th?y m?t, tr? khi bc s? yu c?u qu v? ng?ng ?i?u tr?Sander Nephew v? c th? b? bu?n  ng? ho?c chng m?t. Khng ???c li xe, s? d?ng my mc, ho?c lm nh?ng vi?c c?n ph?i t?nh to cho t?i khi qu v? bi?t ???c thu?c ny ?nh h??ng ln qu v? nh? th? no. Khng ???c ng?i d?y ho?c ??ng d?y nhanh, ??c bi?t l khi qu v? l b?nh nhn l?n tu?i. ?i?u ny lm gi?m nguy c? b? chng m?t ho?c ng?t x?u. Trong m?t s? tr??ng h?p, qu v? c th? ???c cho dng cc thu?c ph? thm ?? gip gi?m tc d?ng ph?. Hy lm theo t?t c? cc h??ng d?n v? vi?c s? d?ng chng. Hy h?i  ki?n bc s? ho?c chuyn vin y t?, n?u qu v? b? s?t, ?n l?nh ho?c ?au h?ng, ho?c c cc tri?u ch?ng khc c?a c?m l?nh ho?c cm. Khng ???c t? ?i?u tr? cho mnh. Thu?c ny c th? lm gi?m kh? n?ng ch?ng l?i cc b?nh nhi?m trng c?a c? th?. Hy c? trnh ? g?n nh?ng ng??i b? b?nh. Thu?c ny c th? lm t?ng nguy c? b? b?m tm ho?c ch?y mu. Hy lin l?c v?i bc s? ho?c chuyn vin y t?, n?u qu v? th?y ch?y mu b?t th??ng. [name] s? c?n ?i lm cc xt nghi?m mu ??nh k? trong khi qu v? dng thu?c ny. ? m?t s? b?nh nhn, thu?c ny c th? gy ra nhi?m trng no nghim tr?ng c th? d?n ??n  t? vong. N?u qu v? c b?t k? v?n ?? no v? kh? n?ng nhn, suy ngh?, ni, ?i l?i ho?c ??ng, th hy bo ngay cho bc s?. N?u qu v? khng th? lin l?c ???c v?i bc s?, thi? ha?y tm ngay n?i ch?m [place] y t? khc. Khng ???c ?? c thai trong khi dng thu?c ny. Ph? n? c?n ph?i thng bo cho bc s? c?a mnh, n?u mu?n c thai ho?c ngh? r?ng c th? mnh ? c [place]. C nguy c? v? cc tc d?ng ph? nghim tr?ng ??i v?i [place] nhi. Hy th?o lu?n v?i bc s? ho?c chuyn vin y t? ho?c d??c s? ?? bi?t thm thng tin. Khng ???c nui con b?ng s?a m? trong khi dng thu?c ny. Hy lin l?c v?i bc s? ho?c chuyn vin y t? n?u qu v? b? tiu ch?y n?ng, bu?n i v i m?a, ho?c n?u qu v? b? ra nhi?u m? hi. Tnh tr?ng m?t qu nhi?u d?ch c? th? c th? gy nguy hi?m khi qu v? dng thu?c ny. Ti c th? nh?n th?y nh?ng tc d?ng ph? no khi dng thu?c ny? Nh?ng tc d?ng ph? qu v? c?n ph?i bo cho bc s? ho?c chuyn vin y t? cng s?m cng t?t: -cc ph?n ?ng d? ?ng, ch?ng h?n nh? da b? m?n ??, ng?a, n?i my ?ay, s?ng ? m?t, mi, ho?c l??i -kh th? -thay ??i thnh l?c -thay ??i th? l?c -tim ??p nhanh ho?c khng ??u -c?m th?y chong vng, ng?t x?u, b? t -?au, t ho?c c?m gic nh? b? ki?n b ? bn tay ho?c bn chn -?au ? vng b?ng trn bn ph?i -co gi?t (kinh phong) -s?ng ? m?t c chn, bn chn, ho?c bn tay -b? b?m tm ho?c xu?t huy?t b?t th??ng -m?t m?i ho?c y?u ?t b?t th??ng -i m?a -vng da ho?c m?t Cc tc d?ng ph? khng c?n ph?i ch?m [place] y t? (hy bo cho bc s? ho?c chuyn vin y t?, n?u cc tc d?ng ph? ny ti?p di?n ho?c gy phi?n toi): -cc thay ??i c?m xc ho?c tm tr?ng -  to bn -tiu ch?y -m?t c?m gic ngon mi?ng -?au ??u -kch ?ng ? ch? tim -bu?n i Danh sch ny c th? khng m t? ?? h?t cc tc d?ng ph? c th? x?y ra. Xin g?i t?i bc s? c?a mnh ?? ???c c? v?n chuyn mn v? cc tc d?ng ph?Sander Nephew v? c th? t??ng trnh cc tc d?ng ph? cho FDA theo s? [phone]. Ti nn c?t gi? thu?c c?a  mnh ? ?u? Thu?c ny ???c s? d?ng b?i chuyn vin y t? ? b?nh vi?n ho?c ? phng m?ch. [name] s? khng ???c c?p thu?c ny ?? c?t gi? t?i nh. L?U : ?y l b?n tm t?t. N c th? khng bao hm t?t c? thng tin c th? c. N?u qu v? th?c m?c v? thu?c ny, xin trao ??i v?i bc s?, d??c s?, ho?c ng??i cung c?p d?ch v? y t? c?a mnh.    2016, Elsevier/[employer] Standard. (2014-06-02 00:00:00) Cyclophosphamide injection ?y l thu?c g? CYCLOPHOSPHAMIDE l thu?c ha tr? li?u. N lm ch?m s? pht tri?n c?a cc t? bo ung th?. Thu?c ny ???c dng ?? ?i?u tr? nhi?u lo?i ung th?, ch?ng h?n nh? b?nh b??u b?ch huy?t, b??u t?y, b?nh b?ch c?u c tnh, ung th? v, v ung th? bu?ng tr?ng. Thu?c ny c th? ???c dng cho nh?ng m?c ?ch khc; hy h?i ng??i cung c?p d?ch v? y t? ho?c d??c s? c?a mnh, n?u qu v? c th?c m?c. Ti c?n ph?i bo cho ng??i cung c?p d?ch v? y t? c?a mnh ?i?u g tr??c khi dng thu?c ny? H? c?n bi?t li?u qu v? c b?t k? tnh tr?ng no sau ?y khng: -cc r?i lo?n v? mu -ti?n s? ha tr? li?u khc -nhi?m trng -b?nh th?n -b?nh gan -m?i x? tr? g?n ?y ho?c ?ang x? tr? -u b??u trong t?y x??ng -pha?n ??ng b?t th???ng ho??c di? ??ng v??i cyclophosphamide ho?c cc thu?c ha tr? li?u khc -pha?n ??ng b?t th???ng ho??c di? ??ng v??i ca?c d??c ph?m kha?c, th?c ph?m, thu?c nhu?m, ho??c ch?t ba?o qua?n -?ang c thai ho??c ??nh co? thai -?ang cho con bu? Ti nn s? d?ng thu?c ny nh? th? no? Thu?c ny ?? tim vo t?nh m?ch ho?c vo b?p th?t, ho?c ?? truy?n vo t?nh m?ch. Thu?c ny ???c cho trong b?nh vi?n ho?c phng m?ch b?i chuyn vin y t? ???c hu?n luy?n ??c bi?t. Hy bn v?i bc s? nhi khoa c?a qu v? v? vi?c dng thu?c ny ? tr? em. C th? c?n ch?m [place] ??c bi?t. Qu li?u: N?u qu v? cho r?ng mnh ? dng qu nhi?u thu?c ny, th hy lin l?c v?i trung tm ki?m sot ch?t ??c ho?c phng c?p c?u ngay l?p t?c. L?U : Thu?c ny ch? dnh ring cho qu v?. Khng chia s? thu?c ny v?i nh?ng  ng??i khc. N?u ti l? qun m?t li?u th sao? ?i?u quan tr?ng l khng nn b? l? li?u thu?c no. Hy lin l?c v?i bc s? ho?c [place] vin y t? c?a mnh, n?u qu v? khng th? gi? ?ng cu?c h?n khm. Nh?ng g c th? t??ng tc v?i thu?c ny? Thu?c ny c th? t??ng tc v?i cc thu?c sau ?y: -amiodarone -amphotericin B -azathioprine -m?t s? thu?c khng virus dng ?? tr? nhi?m HIV ho?c AIDS nh? cc thu?c ?c ch? men protease (protease inhibitor) (v d? nh? indinavir, ritonavir) v zidovudine -m?t s? thu?c dng ?? tr? huy?t p cao nh? benazepril, captopril, enalapril, fosinopril, lisinopril, moexipril, monopril, perindopril,  quinapril, ramipril, trandolapril -m?t s? thu?c tr? ung th? nh? cc thu?c anthracycline (v d? nh? daunorubicin, doxorubicin), busulfan, cytarabine, paclitaxel, pentostatin, tamoxifen, trastuzumab -m?t s? thu?c l?i ti?u nh? chlorothiazide, chlorthalidone, hydrochlorothiazide, indapamide, metolazone -m?t s? thu?c ?i?u tr? ho?c phng ng?a c?c mu ?ng, ch?ng h?n nh? warfarin -m?t s? thu?c gin c? b?p ch?ng h?n nh? succinylcholine -cyclosporine -etanercept -indomethacin -m?t s? thu?c lm t?ng s? l??ng t? bo mu, ch?ng h?n nh? filgrastim, pegfilgrastim, sargramostim -cc thu?c dng ?? gy m ton thn -metronidazole -natalizumab Danh sch ny c th? khng m t? ?? h?t cc t??ng tc c th? x?y ra. Hy ??a cho ng??i cung c?p d?ch v? y t? c?a mnh danh sch t?t c? cc thu?c, th?o d??c, cc thu?c khng c?n toa, ho?c cc ch? ph?m b? sung m qu v? dng. C?ng nn bo cho h? bi?t r?ng qu v? c ht thu?c, u?ng r??u, ho?c c s? d?ng ma ty tri php hay khng. Vi th? c th? t??ng tc v?i thu?c c?a qu v?. Ti c?n ph?i theo di ?i?u g trong khi dng thu?c ny? Hy ?i g?p bc s? ho?c [place] vin y t? ?? theo di ??nh k? s? c?i thi?n c?a qu v?. Thu?c ny c th? lm cho qu v? c?m th?y khng ???c kh?e nh? th??ng l?. ?i?u ny khng ph?i khng ph? bi?n, b?i v thu?c ha tr? li?u c th? ?nh  h??ng ??n c? t? bo lnh l?n t? bo ung th?. Hy t??ng trnh m?i tc d?ng ph?. Hy ti?p t?c ??t ?i?u tr? c?a mnh ngay c? khi qu v? c?m th?y m?t, tr? khi bc s? yu c?u qu v? ng?ng ?i?u tr?. Hy u?ng n??c ho?c ch?t l?ng nh? ? ???c ch? d?n. Hy ?i ti?u th??ng xuyn, k? c? ban ?m. Trong m?t s? tr??ng h?p, qu v? c th? ???c cho dng cc thu?c ph? thm ?? gip gi?m tc d?ng ph?. Hy lm theo t?t c? cc h??ng d?n v? vi?c s? d?ng chng. Hy h?i  ki?n bc s? ho?c chuyn vin y t?, n?u qu v? b? s?t, ?n l?nh ho?c ?au h?ng, ho?c c cc tri?u ch?ng khc c?a c?m l?nh ho?c cm. Khng ???c t? ?i?u tr? cho mnh. Thu?c ny c th? lm gi?m kh? n?ng ch?ng l?i cc b?nh nhi?m trng c?a c? th?. Hy c? trnh ? g?n nh?ng ng??i b? b?nh. Thu?c ny c th? lm t?ng nguy c? b? b?m tm ho?c ch?y mu. Hy lin l?c v?i bc s? ho?c chuyn vin y t?, n?u qu v? th?y ch?y mu b?t th??ng. Hy c?n th?n khi ?nh r?ng ho?c x?a r?ng b?ng ch? nha khoa ho?c b?ng t?m, b?i v qu v? c th? d? b? nhi?m trng ho?c d? b? ch?y mu h?n. N?u qu v? c ?i lm r?ng, th hy bo v?i nha s? r?ng qu v? ?ang dng thu?c ny. [name] c th? b? bu?n ng? ho?c chng m?t. Khng ???c li xe, s? d?ng my mc, ho?c lm nh?ng vi?c c?n ph?i t?nh to cho t?i khi qu v? bi?t ???c thu?c ny ?nh h??ng ln qu v? nh? th? no. Khng ???c c thai trong th?i gian u?ng thu?c ny ho?c trong vng 1 n?m sau khi ng?ng dng thu?c. Ph? n? c?n ph?i thng bo cho bc s? c?a mnh, n?u mu?n c thai ho?c ngh? r?ng c th? mnh ? c [place]. Nam gi??i khng nn gy thu? thai trong khi du?ng thu?c na?y va? trong vng 4 tha?ng sau khi ng?ng thu?c.  C nguy c? v? cc tc d?ng ph? nghim tr?ng ??i v?i [place] nhi. Hy th?o lu?n v?i bc s? ho?c chuyn vin y t? ho?c d??c s? ?? bi?t thm thng tin. Khng ???c nui con b?ng s?a m? trong khi dng thu?c ny. Thu?c na?y co? th? c?n tr? kha? n?ng co? con. Thu?c ny ?a? gy suy bu?ng tr??ng ?? m?t s? phu? n??. Nam gi?i c th? b? gi?m s? l??ng tinh  trng trong khi dng thu?c ny. Quy? vi? nn th?o lu?n v?i bc s? ho?c [place] vin y t? c?a mnh, n?u qu v? lo l??ng v? kha? n?ng thu? tinh. N?u qu v? s?p ???c gi?i ph?u, th hy bo cho bc s? ho?c chuyn vin y t? bi?t r?ng qu v? ? dng thu?c ny. Ti c th? nh?n th?y nh?ng tc d?ng ph? no khi dng thu?c ny? Nh?ng tc d?ng ph? qu v? c?n ph?i bo cho bc s? ho?c chuyn vin y t? cng s?m cng t?t: -cc ph?n ?ng d? ?ng, ch?ng h?n nh? da b? m?n ??, ng?a, n?i my ?ay, s?ng ? m?t, mi, ho?c l??i -gi?m s? l??ng t? bo mu - thu?c ny c th? lm gi?m s? l??ng t? bo b?ch c?u, t? bo h?ng c?u v ti?u c?u. [name] c th? c nguy c? cao b? nhi?m trng v xu?t huy?t. -cc d?u hi?u nhi?m trng - s?t ho?c ?n l?nh, ho, ?au h?ng, kh ?i ti?u ho?c ?i ti?u ?au -cc d?u hi?u gi?m ti?u c?u ho?c xu?t huy?t - b?m tm, cc n?t l?m t?m ?? trn da, phn c mu ?en, mu h?c n, c mu trong n??c ti?u -cc d?u hi?u gi?m s? l??ng t? bo h?ng c?u - c?m th?y y?u ?t ho?c m?t m?i m?t cch b?t th??ng, cc c?n ng?t x?u, chong vng -kh th? -n??c ti?u s?m mu -chng m?t -?nh tr?ng ng?c -s?ng ? m?t c chn, bn chn, ho?c bn tay -kh ?i ti?u ho?c thay ??i l??ng n??c ti?u ???c bi ti?t -t?ng cn -vng da ho?c m?t Cc tc d?ng ph? khng c?n ph?i ch?m [place] y t? (hy bo cho bc s? ho?c chuyn vin y t?, n?u cc tc d?ng ph? ny ti?p di?n ho?c gy phi?n toi): -cc thay ??i mu mng ho?c da -r?ng tc -khng th?y kinh nguy?t -lot mi?ng -bu?n i ho?c i m?a Danh sch ny c th? khng m t? ?? h?t cc tc d?ng ph? c th? x?y ra. Xin g?i t?i bc s? c?a mnh ?? ???c c? v?n chuyn mn v? cc tc d?ng ph?Sander Nephew v? c th? t??ng trnh cc tc d?ng ph? cho FDA theo s? [phone]. Ti nn c?t gi? thu?c c?a mnh ? ?u? Thu?c ny ???c s? d?ng b?i chuyn vin y t? ? b?nh vi?n ho?c ? phng m?ch. [name] s? khng ???c c?p thu?c ny ?? c?t gi? t?i nh. L?U : ?y l b?n tm t?t. N c th? khng bao hm t?t c? thng tin c th? c.  N?u qu v? th?c m?c v? thu?c ny, xin trao ??i v?i bc s?, d??c s?, ho?c ng??i cung c?p d?ch v? y t? c?a mnh.    2016, [employer] Standard. (2012-01-20 00:00:00)

## 2014-12-08 NOTE — Progress Notes (Signed)
[facility]  [phone] [phone]  Clinic Follow up Note   Patient Care Team: Truitt Merle, MD as PCP - General (Hematology) [name], MD as Referring Physician (Specialist) [name], MD as Referring Physician (Specialist) 12/08/2014    CHIEF COMPLAINTS:  Follow up acute plasma cell leukemia   HISTORY OF PRESENTING ILLNESS:  Sheryl [name] 44 y.o. female is here because of recently diagnosed plasma cell leukemia. She was recently discharged form [facility] 3 days ago and is here to establish her local oncological care with [place]. She is a [place], does not speak [place], she is accompanied to the clinic by her daughter and interpreter.  She presented to our hospital lth with worsening dyspnea, fatigue, cough, subjective fevers and chills, and was admitted on 09/14/2014. She was found to have allergy WBC 54.1K, hemoglobin 8.1, plt 310, she was treated with symptom management, and was discharged home on 09/17/2014, with a plan to follow-up with hematology. She represents to emergency room on 10/07/2014, was found to have white count of 78K, and worsening anemia with hemoglobin 6. She was seen by my partner Dr. Jonette Eva and plasma cell leukemia was suspected. She was transferred to [facility] for further leukemia work-up and treatment. Bone marrow biopsy was done, which confirmed plasma cell leukemia, and she started chemotherapy with Velcade, Cytoxan and dexamethasone. She received weekly dose twice, last dose on 10/20/2014. She was discharged to home afterwards. She had a mediport placed during her hospitalization.  She has moderate fatigue, low appetie, she lost about 13 lbs in the past few months. She has moderate abdominal pain, 5-6/10, persistent, she does not take any pain meds.   I reviewed her medical records extensively, and discussed her case with Dr. Jorje Guild and [place] program coordinator.  INTERIM HISTORY: Sheryl [name]  returns for follow up and cycle 2 D22 CyborD. She reports worsening dry cough, which weeks or up at night. She has minimal sputum production, no fever or chills, no chest pain. Her mother has similar cough at home. She also has a mild right upper quadrant abdominal pain, intermittent, no nausea or vomiting. Her bowel movements normal. Her energy level and appetite are moderate, no other new complaints. She is a accompanied by the interpreter to the clinic today.  MEDICAL HISTORY:  Past Medical History  Diagnosis Date  . GERD (gastroesophageal reflux disease)   . Hepatitis   . History of positive PPD     DX 2011--  CXR DONE NO EVIDENCE  . Hydronephrosis, right   . Right ureteral stone   . History of ureter stent   . Chills with fever     intermittently since d/c from hospital  . Dysuria-frequency syndrome     w/ pink urine  . Urosepsis 8/14    admitted to [facility]  . Pneumonia   . Neuromuscular disorder (HCC)     legs numb intermittently    SURGICAL HISTORY: Past Surgical History  Procedure Laterality Date  . Cystoscopy w/ ureteral stent placement Right 09/25/2012    Procedure: CYSTOSCOPY WITH RETROGRADE PYELOGRAM/URETERAL STENT PLACEMENT;  Surgeon: Alexis Frock, MD;  Location: [facility];  Service: Urology;  Laterality: Right;  . Liver biopsy    . Right vats w/ drainage peural effusion and bx's  10-30-2008  . Removal of uterine cyst      years ago  . Other surgical history Right     removal of ovarian cyst  . Cystoscopy with retrograde pyelogram, ureteroscopy  and stent placement Right 10/15/2012    Procedure: CYSTOSCOPY WITH RETROGRADE PYELOGRAM, URETEROSCOPY AND REMOVAL STENT WITH  STENT PLACEMENT;  Surgeon: Alexis Frock, MD;  Location: [facility];  Service: Urology;  Laterality: Right;  . Holmium laser application Right 6/96/2952    Procedure: HOLMIUM LASER APPLICATION;  Surgeon: Alexis Frock, MD;  Location: [facility];  Service: Urology;   Laterality: Right;  . Esophagogastroduodenoscopy (egd) with propofol N/A 11/16/2014    Procedure: ESOPHAGOGASTRODUODENOSCOPY (EGD) WITH PROPOFOL;  Surgeon: Milus Banister, MD;  Location: [facility];  Service: Endoscopy;  Laterality: N/A;    SOCIAL HISTORY: Social History   Social History  . Marital Status: Single    Spouse Name: N/A  . Number of Children: 15   . Years of Education: N/A   Occupational History  . Not on file.   Social History Main Topics  . Smoking status: Never Smoker   . Smokeless tobacco: Not on file  . Alcohol Use: No  . Drug Use: No  . Sexual Activity: Not Currently    Birth Control/ Protection: Abstinence   Other Topics Concern  . Not on file   Social History Narrative    FAMILY HISTORY: Family History  Problem Relation Age of Onset  . Stomach cancer Mother   . Lung disease Father   . Asthma Father     ALLERGIES:  has No Known Allergies.  MEDICATIONS:  Current Outpatient Prescriptions  Medication Sig Dispense Refill  . acyclovir (ZOVIRAX) 400 MG tablet Take 1 tablet (400 mg total) by mouth 2 (two) times daily. 60 tablet 3  . dexamethasone (DECADRON) 4 MG tablet Take 10 tablets (40 mg total) by mouth once a week. On the day of chemo treatment 40 tablet 2  . entecavir (BARACLUDE) 0.5 MG tablet Take 0.5 mg by mouth daily.     Marland Kitchen lidocaine-prilocaine (EMLA) cream Apply 1 application topically as needed. (Patient taking differently: Apply 1 application topically as needed (port). ) 30 g 6  . omeprazole (PRILOSEC) 20 MG capsule Take 1 capsule (20 mg total) by mouth daily. 30 capsule 2  . polyethylene glycol (MIRALAX / GLYCOLAX) packet Take 17 g by mouth as needed for moderate constipation.      No current facility-administered medications for this visit.    REVIEW OF SYSTEMS:   Constitutional: Denies fevers, chills or abnormal night sweats Eyes: Denies blurriness of vision, double vision or watery eyes Ears, nose, mouth, throat, and face:  Denies mucositis or sore throat Respiratory: Denies cough, dyspnea or wheezes Cardiovascular: Denies palpitation, chest discomfort or lower extremity swelling Gastrointestinal:  Denies nausea, heartburn or change in bowel habits Skin: Denies abnormal skin rashes Lymphatics: Denies new lymphadenopathy or easy bruising Neurological:Denies numbness, tingling or new weaknesses Behavioral/Psych: Mood is stable, no new changes  All other systems were reviewed with the patient and are negative.  PHYSICAL EXAMINATION: ECOG PERFORMANCE STATUS: 2 - Symptomatic, <50% confined to bed  Filed Vitals:   12/08/14 1244  BP: 107/65  Pulse: 101  Temp: 98.6 F (37 C)  Resp: 18   Filed Weights   12/08/14 1244  Weight: 132 lb 1.6 oz (59.92 kg)    GENERAL:alert, no distress and comfortable SKIN: skin color, texture, turgor are normal, no rashes or significant lesions EYES: normal, conjunctiva are pink and non-injected, sclera clear OROPHARYNX:no exudate, no erythema and lips, buccal mucosa, and tongue normal  NECK: supple, thyroid normal size, non-tender, without nodularity LYMPH:  no palpable lymphadenopathy in the  cervical, axillary or inguinal LUNGS: clear to auscultation and percussion with normal breathing effort HEART: regular rate & rhythm and no murmurs and no lower extremity edema ABDOMEN:abdomen soft, non-tender and normal bowel sounds Musculoskeletal:no cyanosis of digits and no clubbing  PSYCH: alert & oriented x 3 with fluent speech NEURO: no focal motor/sensory deficits  LABORATORY DATA:  I have reviewed the data as listed CBC Latest Ref Rng 12/08/2014 12/01/2014 11/24/2014  WBC 3.9 - 10.3 10e3/uL 7.9 5.9 5.9  Hemoglobin 11.6 - 15.9 g/dL 11.3(L) 11.4(L) 10.6(L)  Hematocrit 34.8 - 46.6 % 34.8 34.5(L) 32.9(L)  Platelets 145 - 400 10e3/uL 281 268 244    CMP Latest Ref Rng 12/01/2014 11/24/2014 11/17/2014  Glucose 70 - 140 mg/dl 131 119 133  BUN 7.0 - 26.0 mg/dL 7.0 9.8 10.7    Creatinine 0.6 - 1.1 mg/dL 0.6 0.6 0.6  Sodium 136 - 145 mEq/L 140 137 138  Potassium 3.5 - 5.1 mEq/L 3.9 3.9 4.0  Chloride 101 - 111 mmol/L - - -  CO2 22 - 29 mEq/L '24 26 24  ' Calcium 8.4 - 10.4 mg/dL 8.9 8.6 8.4  Total Protein 6.4 - 8.3 g/dL 6.4 6.1(L) 6.2(L)  Total Bilirubin 0.20 - 1.20 mg/dL <0.30 <0.30 0.37  Alkaline Phos 40 - 150 U/L 200(H) 223(H) 185(H)  AST 5 - 34 U/L 24 49(H) 42(H)  ALT 0 - 55 U/L '11 29 28     ' SPEP M-protein  09/15/2014: 4.2 10/08/14: 4.6  IgG 11/03/2014: 3150 mg/dl   24 h urine UPEP/IFE and light chain: 11/03/2014: IFE showed a monoclonal IgG heavy chain with associated lambda light chain. 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  See comment and CBC data.      COMMENT:  The bone marrow aspirate smears are spicular, cellular and  adequate for evaluation. Atypical plasma cells comprise 57% of  the cellularity (500 cell manual differential count). The plasma  cells have enlarged nuclei with scattered binucleated forms and  irregular nuclear contour. There is diminished multilineage  hematopoiesis with adequate maturation. Blasts are not increased  in numbers (less than 1% by manual differential counts). 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 There is no overt dysplasia of the  myeloid or erythroid lineages. Megakaryocytes are quantitatively  and qualitatively unremarkable. There is no lymphocytosis.    Examination of the bone marrow core biopsy reveals a markedly  hypercellular marrow for age (95%) with near complete replacement  by sheets of plasma cells. Rare myeloid and erythroid elements  are present. Megakaryocytes are sparse and focally clustered with   hypolobated and hyperchromatic nuclei. There are no atypical  lymphoid aggregates or granulomas identified. Examination of the  bone marrow clot section reveals similar findings.    The peripheral blood demonstrates a leukocytosis comprised of  large plasma cells and normocytic anemia. Rouleaux is present.  There is a myeloid left shift with rare circulating blasts.    By flow cytometry on the peripheral blood, the abnormal plasma  cell population comprises 64% of the cellular elements and  expresses cytoplasmic CD138, CD45 and cLambda with dim expression  of CD19. The plasma cells are negative for surface CD2, CD3, CD4,  CD5, CD7, CD8, CD56, CD10, CD20, sKappa, sLambda, HLA-DR and  cKappa.    Overall, the findings demonstrate extensive involvement by plasma  cell myeloma/leukemia.     RADIOGRAPHIC STUDIES: I have personally reviewed the radiological images as listed and agreed with the findings in the report. Dg Chest 2 View  12/01/2014  CLINICAL DATA:  Plasma cell leukemia EXAM: CHEST  2 VIEW COMPARISON:  10/07/2014 FINDINGS: Cardiomediastinal silhouette is stable. Central mild bronchitic changes. Stable streaky bilateral basilar atelectasis or scarring. No pulmonary edema. No segmental infiltrate. Right IJ Port-A-Cath with tip in SVC right atrium junction. IMPRESSION: Central mild bronchitic changes. Stable streaky bilateral basilar atelectasis or scarring. No pulmonary edema. No segmental infiltrate Electronically Signed   By: Lahoma Crocker M.D.   On: 12/01/2014 15:56   Dg Abd 1 View  11/10/2014  CLINICAL DATA:  Abdominal pain, history of plasma cell leukemia EXAM: ABDOMEN - 1 VIEW COMPARISON:  None. FINDINGS: Scattered large and small bowel gas is noted. Fecal material is noted throughout the colon consistent with a mild degree of constipation. No obstructive changes are seen. No bony abnormality is noted. Multiple injection granulomas are seen. No free air is noted.  IMPRESSION: Nonspecific abdomen. Electronically Signed   By: Inez Catalina M.D.   On: 11/10/2014 12:56    ASSESSMENT & PLAN: 44 year old [place], presented with anemia and leokocytosis   1. Acute plasma cell leukemia -She has started cyborD at [facility] in early Sep 2016 -She is on second cycle CyBorD (weekly) now, tolerating well. -She is being followed by Dr. 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 Her next appointment is in early December -continue acyclovir when she is on Velcade -Continue allopurinol -She has had good response to induction chemotherapy. Her white blood count has come down to normal. I repeated her SPEP today, result is still pending -Lab reviewed, adequate for treatment, she'll proceed cycle 2 day 22 treatment today.  2. Worsening cough  -It has been going on for a few weeks, x-ray last week showed Central mild bronchitis changes, no pneumonia. Her blood culture last week was negative -I'll give her a course of his Romycin 500 mg daily for 5 days for presumed acute bronchitis.   3. Anorexia, constipation, abdominal pain -Likely secondary to chemotherapy, her symptoms has improved lately. -I encouraged her to use nutrition supplement, such as Ensure or boost -Follow up with the dietitian -EGD was negative  3. Chronic Hepatitis B  -She was started on entecavir at [facility], we'll continue. -Her liver function is normal, she will follow up with [facility]  4. Normocytic anemia -Likely secondary to her acute plasma cell leukemia -Hemoglobin stable, no need for transfusion. -We'll monitor closely.  5. Communication and Social support -She does not speak English, she is accompanied by interpreter to our cancer center -She has significant communication barrier, limited social support, and poor health literacy  -I sent her to our financial counselor to see if she will be qualified for our cancer center financial assistance for her  medications.  Plan -C2D22 CyBorD today, and continue weekly -I'll see her back next week for follow up and cycle 3 day 1 treatment   All questions were answered. The patient knows to call the clinic with any problems, questions or concerns. I spent 20 minutes counseling the patient face to face. The total time spent in the appointment was 30 minutes and more than 50% was on counseling.     Truitt Merle, MD 12/08/2014 1:03 PM

## 2014-12-08 NOTE — Telephone Encounter (Signed)
Per staff message and POF I have scheduled appts. Advised scheduler of appts. JMW  

## 2014-12-12 LAB — SPEP & IFE WITH QIG
ABNORMAL PROTEIN BAND1: 0.2 g/dL
ALBUMIN ELP: 3.5 g/dL — AB (ref 3.8–4.8)
ALPHA-1-GLOBULIN: 0.4 g/dL — AB (ref 0.2–0.3)
ALPHA-2-GLOBULIN: 1 g/dL — AB (ref 0.5–0.9)
BETA 2: 0.3 g/dL (ref 0.2–0.5)
Beta Globulin: 0.5 g/dL (ref 0.4–0.6)
GAMMA GLOBULIN: 0.7 g/dL — AB (ref 0.8–1.7)
IGM, SERUM: 22 mg/dL — AB (ref 52–322)
IgA: 28 mg/dL — ABNORMAL LOW (ref 69–380)
IgG (Immunoglobin G), Serum: 759 mg/dL (ref 690–1700)
Total Protein, Serum Electrophoresis: 6.4 g/dL (ref 6.1–8.1)

## 2014-12-15 ENCOUNTER — Encounter: Payer: Self-pay | Admitting: Hematology

## 2014-12-15 ENCOUNTER — Telehealth: Payer: Self-pay | Admitting: Hematology

## 2014-12-15 ENCOUNTER — Telehealth: Payer: Self-pay | Admitting: *Deleted

## 2014-12-15 ENCOUNTER — Ambulatory Visit (HOSPITAL_BASED_OUTPATIENT_CLINIC_OR_DEPARTMENT_OTHER): Payer: 59

## 2014-12-15 ENCOUNTER — Ambulatory Visit (HOSPITAL_BASED_OUTPATIENT_CLINIC_OR_DEPARTMENT_OTHER): Payer: 59 | Admitting: Hematology

## 2014-12-15 ENCOUNTER — Other Ambulatory Visit (HOSPITAL_BASED_OUTPATIENT_CLINIC_OR_DEPARTMENT_OTHER): Payer: 59

## 2014-12-15 VITALS — BP 113/70 | HR 98 | Temp 97.8°F | Resp 20 | Ht 63.0 in | Wt 130.5 lb

## 2014-12-15 DIAGNOSIS — C9010 Plasma cell leukemia not having achieved remission: Secondary | ICD-10-CM

## 2014-12-15 DIAGNOSIS — B181 Chronic viral hepatitis B without delta-agent: Secondary | ICD-10-CM | POA: Diagnosis not present

## 2014-12-15 DIAGNOSIS — Z5112 Encounter for antineoplastic immunotherapy: Secondary | ICD-10-CM | POA: Diagnosis not present

## 2014-12-15 DIAGNOSIS — J40 Bronchitis, not specified as acute or chronic: Secondary | ICD-10-CM

## 2014-12-15 DIAGNOSIS — D649 Anemia, unspecified: Secondary | ICD-10-CM

## 2014-12-15 LAB — CBC WITH DIFFERENTIAL/PLATELET
BASO%: 0.2 % (ref 0.0–2.0)
Basophils Absolute: 0 10*3/uL (ref 0.0–0.1)
EOS%: 0.5 % (ref 0.0–7.0)
Eosinophils Absolute: 0 10*3/uL (ref 0.0–0.5)
HCT: 37.7 % (ref 34.8–46.6)
HEMOGLOBIN: 12.1 g/dL (ref 11.6–15.9)
LYMPH%: 13.1 % — ABNORMAL LOW (ref 14.0–49.7)
MCH: 29.2 pg (ref 25.1–34.0)
MCHC: 32.1 g/dL (ref 31.5–36.0)
MCV: 91.1 fL (ref 79.5–101.0)
MONO#: 0.2 10*3/uL (ref 0.1–0.9)
MONO%: 3.2 % (ref 0.0–14.0)
NEUT%: 83 % — ABNORMAL HIGH (ref 38.4–76.8)
NEUTROS ABS: 4.6 10*3/uL (ref 1.5–6.5)
Platelets: 272 10*3/uL (ref 145–400)
RBC: 4.14 10*6/uL (ref 3.70–5.45)
RDW: 15.6 % — AB (ref 11.2–14.5)
WBC: 5.6 10*3/uL (ref 3.9–10.3)
lymph#: 0.7 10*3/uL — ABNORMAL LOW (ref 0.9–3.3)

## 2014-12-15 LAB — COMPREHENSIVE METABOLIC PANEL (CC13)
ALBUMIN: 3.4 g/dL — AB (ref 3.5–5.0)
ALT: 21 U/L (ref 0–55)
AST: 46 U/L — AB (ref 5–34)
Alkaline Phosphatase: 209 U/L — ABNORMAL HIGH (ref 40–150)
Anion Gap: 12 mEq/L — ABNORMAL HIGH (ref 3–11)
BUN: 7.7 mg/dL (ref 7.0–26.0)
CALCIUM: 9.3 mg/dL (ref 8.4–10.4)
CHLORIDE: 108 meq/L (ref 98–109)
CO2: 22 meq/L (ref 22–29)
Creatinine: 0.7 mg/dL (ref 0.6–1.1)
EGFR: >90 mL/min/{1.73_m2} (ref >90)
GLUCOSE: 116 mg/dL (ref 70–140)
POTASSIUM: 4 meq/L (ref 3.5–5.1)
Sodium: 142 mEq/L (ref 136–145)
TOTAL PROTEIN: 6.8 g/dL (ref 6.4–8.3)
Total Bilirubin: <0.3 mg/dL (ref 0.20–1.20)

## 2014-12-15 MED ORDER — SODIUM CHLORIDE 0.9 % IJ SOLN
10.0000 mL | INTRAMUSCULAR | Status: DC | PRN
  Administered 2014-12-15: 10 mL via INTRAVENOUS
  Filled 2014-12-15: qty 10

## 2014-12-15 MED ORDER — BORTEZOMIB CHEMO SQ INJECTION 3.5 MG (2.5MG/ML)
1.5000 mg/m2 | Freq: Once | INTRAMUSCULAR | Status: AC
  Administered 2014-12-15: 2.5 mg via SUBCUTANEOUS
  Filled 2014-12-15: qty 2.5

## 2014-12-15 MED ORDER — SODIUM CHLORIDE 0.9 % IV SOLN
300.0000 mg/m2 | Freq: Once | INTRAVENOUS | Status: AC
  Administered 2014-12-15: 480 mg via INTRAVENOUS
  Filled 2014-12-15: qty 24

## 2014-12-15 MED ORDER — HEPARIN SOD (PORK) LOCK FLUSH 100 UNIT/ML IV SOLN
500.0000 [IU] | Freq: Once | INTRAVENOUS | Status: AC
  Administered 2014-12-15: 500 [IU] via INTRAVENOUS
  Filled 2014-12-15: qty 5

## 2014-12-15 MED ORDER — ONDANSETRON HCL 8 MG PO TABS
ORAL_TABLET | ORAL | Status: AC
  Filled 2014-12-15: qty 1

## 2014-12-15 MED ORDER — ONDANSETRON HCL 8 MG PO TABS
8.0000 mg | ORAL_TABLET | Freq: Once | ORAL | Status: AC
  Administered 2014-12-15: 8 mg via ORAL

## 2014-12-15 NOTE — Telephone Encounter (Signed)
Pt has not been taking Baraclude/entecavir.  Called [facility] & spoke with Arbie Cookey RN & she will check with Dr Shirleen Schirmer to see if pt is to cont this.  [facility] @ [phone] & was informed that # 30 with no refills should have been received Sept 20.

## 2014-12-15 NOTE — Progress Notes (Signed)
[place]  [phone] [phone]) [phone]  Clinic Follow up Note   Patient Care Team: Truitt Merle, MD as PCP - General (Hematology) [name], MD as Referring Physician (Specialist) [name], MD as Referring Physician (Specialist) 12/15/2014    CHIEF COMPLAINTS:  Follow up acute plasma cell leukemia   HISTORY OF PRESENTING ILLNESS:  Sheryl [name] 44 y.o. female is here because of recently diagnosed plasma cell leukemia. She was recently discharged form [facility] 3 days ago and is here to establish her local oncological care with [place]. She is a [place], does not speak [place], she is accompanied to the clinic by her daughter and interpreter.  She presented to our hospital lth with worsening dyspnea, fatigue, cough, subjective fevers and chills, and was admitted on 09/14/2014. She was found to have allergy WBC 54.1K, hemoglobin 8.1, plt 310, she was treated with symptom management, and was discharged home on 09/17/2014, with a plan to follow-up with hematology. She represents to emergency room on 10/07/2014, was found to have white count of 78K, and worsening anemia with hemoglobin 6. She was seen by my partner Dr. Jonette Eva and plasma cell leukemia was suspected. She was transferred to [facility] for further leukemia work-up and treatment. Bone marrow biopsy was done, which confirmed plasma cell leukemia, and she started chemotherapy with Velcade, Cytoxan and dexamethasone. She received weekly dose twice, last dose on 10/20/2014. She was discharged to home afterwards. She had a mediport placed during her hospitalization.  She has moderate fatigue, low appetie, she lost about 13 lbs in the past few months. She has moderate abdominal pain, 5-6/10, persistent, she does not take any pain meds.   I reviewed her medical records extensively, and discussed her case with Dr. Jorje Guild and [place] program coordinator.  CURRENT THERAPY: Cytoxan  300 mg/m, IV, bortezomib 1.5 mg/m subcutaneous, and dexamethasone 40 milligrams, weekly, every 4 weeks, started on 10/13/2014 at [facility], received bortezomib twice a week for the first 4 weeks  INTERIM HISTORY: Sheryl [name] returns for follow up and weekly chemo. She took the azithromycin I prescribed for her last week for 5 days, and her cough has significantly improved. No fever or chills, no other new complaints. She overall feels better. Appetite and energy level are decent. She still has mild abdominal discomfort, especially in the right upper quadrant. No significant pain, nausea, or issues with bowel movement. She functions well at home.   MEDICAL HISTORY:  Past Medical History  Diagnosis Date  . GERD (gastroesophageal reflux disease)   . Hepatitis   . History of positive PPD     DX 2011--  CXR DONE NO EVIDENCE  . Hydronephrosis, right   . Right ureteral stone   . History of ureter stent   . Chills with fever     intermittently since d/c from hospital  . Dysuria-frequency syndrome     w/ pink urine  . Urosepsis 8/14    admitted to [facility]  . Pneumonia   . Neuromuscular disorder (HCC)     legs numb intermittently    SURGICAL HISTORY: Past Surgical History  Procedure Laterality Date  . Cystoscopy w/ ureteral stent placement Right 09/25/2012    Procedure: CYSTOSCOPY WITH RETROGRADE PYELOGRAM/URETERAL STENT PLACEMENT;  Surgeon: Alexis Frock, MD;  Location: [facility];  Service: Urology;  Laterality: Right;  . Liver biopsy    . Right vats w/ drainage peural effusion and bx's  10-30-2008  . Removal of uterine cyst  years ago  . Other surgical history Right     removal of ovarian cyst  . Cystoscopy with retrograde pyelogram, ureteroscopy and stent placement Right 10/15/2012    Procedure: CYSTOSCOPY WITH RETROGRADE PYELOGRAM, URETEROSCOPY AND REMOVAL STENT WITH  STENT PLACEMENT;  Surgeon: Alexis Frock, MD;  Location: [facility];  Service: Urology;  Laterality: Right;  .  Holmium laser application Right 05/12/1446    Procedure: HOLMIUM LASER APPLICATION;  Surgeon: Alexis Frock, MD;  Location: [facility];  Service: Urology;  Laterality: Right;  . Esophagogastroduodenoscopy (egd) with propofol N/A 11/16/2014    Procedure: ESOPHAGOGASTRODUODENOSCOPY (EGD) WITH PROPOFOL;  Surgeon: Milus Banister, MD;  Location: [facility];  Service: Endoscopy;  Laterality: N/A;    SOCIAL HISTORY: Social History   Social History  . Marital Status: Single    Spouse Name: N/A  . Number of Children: 15   . Years of Education: N/A   Occupational History  . Not on file.   Social History Main Topics  . Smoking status: Never Smoker   . Smokeless tobacco: Not on file  . Alcohol Use: No  . Drug Use: No  . Sexual Activity: Not Currently    Birth Control/ Protection: Abstinence   Other Topics Concern  . Not on file   Social History Narrative    FAMILY HISTORY: Family History  Problem Relation Age of Onset  . Stomach cancer Mother   . Lung disease Father   . Asthma Father     ALLERGIES:  has No Known Allergies.  MEDICATIONS:  Current Outpatient Prescriptions  Medication Sig Dispense Refill  . acyclovir (ZOVIRAX) 400 MG tablet Take 1 tablet (400 mg total) by mouth 2 (two) times daily. 60 tablet 3  . dexamethasone (DECADRON) 4 MG tablet Take 10 tablets (40 mg total) by mouth once a week. On the day of chemo treatment 40 tablet 2  . lidocaine-prilocaine (EMLA) cream Apply 1 application topically as needed. (Patient taking differently: Apply 1 application topically as needed (port). ) 30 g 6  . omeprazole (PRILOSEC) 20 MG capsule Take 1 capsule (20 mg total) by mouth daily. 30 capsule 2  . entecavir (BARACLUDE) 0.5 MG tablet Take 0.5 mg by mouth daily.     . polyethylene glycol (MIRALAX / GLYCOLAX) packet Take 17 g by mouth as needed for moderate constipation.      No current facility-administered medications for this visit.    REVIEW OF SYSTEMS:    Constitutional: Denies fevers, chills or abnormal night sweats Eyes: Denies blurriness of vision, double vision or watery eyes Ears, nose, mouth, throat, and face: Denies mucositis or sore throat Respiratory: Denies cough, dyspnea or wheezes Cardiovascular: Denies palpitation, chest discomfort or lower extremity swelling Gastrointestinal:  Denies nausea, heartburn or change in bowel habits Skin: Denies abnormal skin rashes Lymphatics: Denies new lymphadenopathy or easy bruising Neurological:Denies numbness, tingling or new weaknesses Behavioral/Psych: Mood is stable, no new changes  All other systems were reviewed with the patient and are negative.  PHYSICAL EXAMINATION: ECOG PERFORMANCE STATUS: 1  Filed Vitals:   12/15/14 1050  BP: 113/70  Pulse: 98  Temp: 97.8 F (36.6 C)  Resp: 20   Filed Weights   12/15/14 1050  Weight: 130 lb 8 oz (59.194 kg)    GENERAL:alert, no distress and comfortable SKIN: skin color, texture, turgor are normal, no rashes or significant lesions EYES: normal, conjunctiva are pink and non-injected, sclera clear OROPHARYNX:no exudate, no erythema and lips, buccal mucosa, and tongue  normal  NECK: supple, thyroid normal size, non-tender, without nodularity LYMPH:  no palpable lymphadenopathy in the cervical, axillary or inguinal LUNGS: clear to auscultation and percussion with normal breathing effort HEART: regular rate & rhythm and no murmurs and no lower extremity edema ABDOMEN:abdomen soft, non-tender and normal bowel sounds Musculoskeletal:no cyanosis of digits and no clubbing  PSYCH: alert & oriented x 3 with fluent speech NEURO: no focal motor/sensory deficits  LABORATORY DATA:  I have reviewed the data as listed CBC Latest Ref Rng 12/15/2014 12/08/2014 12/01/2014  WBC 3.9 - 10.3 10e3/uL 5.6 7.9 5.9  Hemoglobin 11.6 - 15.9 g/dL 12.1 11.3(L) 11.4(L)  Hematocrit 34.8 - 46.6 % 37.7 34.8 34.5(L)  Platelets 145 - 400 10e3/uL 272 281 268    CMP  Latest Ref Rng 12/08/2014 12/01/2014 11/24/2014  Glucose 70 - 140 mg/dl 106 131 119  BUN 7.0 - 26.0 mg/dL 9.0 7.0 9.8  Creatinine 0.6 - 1.1 mg/dL 0.6 0.6 0.6  Sodium 136 - 145 mEq/L 143 140 137  Potassium 3.5 - 5.1 mEq/L 4.3 3.9 3.9  Chloride 101 - 111 mmol/L - - -  CO2 22 - 29 mEq/L '26 24 26  ' Calcium 8.4 - 10.4 mg/dL 9.4 8.9 8.6  Total Protein 6.4 - 8.3 g/dL 6.6 6.4 6.1(L)  Total Bilirubin 0.20 - 1.20 mg/dL <0.30 <0.30 <0.30  Alkaline Phos 40 - 150 U/L 195(H) 200(H) 223(H)  AST 5 - 34 U/L 23 24 49(H)  ALT 0 - 55 U/L <'9 11 29     ' SPEP M-protein  09/15/2014: 4.2 10/08/14: 4.6 12/08/2014: 0.2  IgG mg/dl 11/03/2014: 3150  12/08/2014:  759  Kappa/lambda light chains levels and ration  11/03/14: 0.75, 152, 0.00  24 h urine UPEP/IFE and light chain: 11/03/2014: IFE showed a monoclonal IgG heavy chain with associated lambda light chain. M protein was Undetectable    PATHOLOGY REPORT  Bone Marrow (BM) and Peripheral Blood (PB) 10/10/2014 [facility])  FINAL PATHOLOGIC DIAGNOSIS      BONE MARROW AND PERIPHERAL BLOOD:  Extensive involvement by plasma cell myeloma/leukemia.  See comment and CBC data.      COMMENT:  The bone marrow aspirate smears are spicular, cellular and  adequate for evaluation. Atypical plasma cells comprise 57% of  the cellularity (500 cell manual differential count). The plasma  cells have enlarged nuclei with scattered binucleated forms and  irregular nuclear contour. There is diminished multilineage  hematopoiesis with adequate maturation. Blasts are not increased  in numbers (less than 1% by manual differential counts). 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 There is no overt dysplasia of the  myeloid or erythroid lineages. Megakaryocytes are quantitatively  and qualitatively unremarkable. There is no lymphocytosis.    Examination of the bone marrow core biopsy  reveals a markedly  hypercellular marrow for age (95%) with near complete replacement  by sheets of plasma cells. Rare myeloid and erythroid elements  are present. Megakaryocytes are sparse and focally clustered with  hypolobated and hyperchromatic nuclei. There are no atypical  lymphoid aggregates or granulomas identified. Examination of the  bone marrow clot section reveals similar findings.    The peripheral blood demonstrates a leukocytosis comprised of  large plasma cells and normocytic anemia. Rouleaux is present.  There is a myeloid left shift with rare circulating blasts.    By flow cytometry on the peripheral blood, the abnormal plasma  cell population comprises 64% of the cellular elements and  expresses cytoplasmic CD138, CD45 and cLambda with dim expression  of CD19. The plasma cells are negative for surface CD2, CD3, CD4,  CD5, CD7, CD8, CD56, CD10, CD20, sKappa, sLambda, HLA-DR and  cKappa.    Overall, the findings demonstrate extensive involvement by plasma  cell myeloma/leukemia.     RADIOGRAPHIC STUDIES: I have personally reviewed the radiological images as listed and agreed with the findings in the report. Dg Chest 2 View  12/01/2014  CLINICAL DATA:  Plasma cell leukemia EXAM: CHEST  2 VIEW COMPARISON:  10/07/2014 FINDINGS: Cardiomediastinal silhouette is stable. Central mild bronchitic changes. Stable streaky bilateral basilar atelectasis or scarring. No pulmonary edema. No segmental infiltrate. Right IJ Port-A-Cath with tip in SVC right atrium junction. IMPRESSION: Central mild bronchitic changes. Stable streaky bilateral basilar atelectasis or scarring. No pulmonary edema. No segmental infiltrate Electronically Signed   By: Lahoma Crocker M.D.   On: 12/01/2014 15:56    ASSESSMENT & PLAN: 44 year old [place], presented with anemia and leokocytosis   1. Acute plasma cell leukemia -She has started cyborD at [facility] in early Sep 2016 -She is on  third cycle CyBorD (weekly) now, tolerating well. -She is being followed by Dr. 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 Her next appointment is on 01/10/2015  -continue acyclovir when she is on Velcade -She has had good response to induction chemotherapy. Her white blood count has come down to normal. Repeated SPEP showed M protein has dropped to 0.4 from pretreatment 4.6 -Lab reviewed, adequate for treatment, she'll proceed treatment today   2. bronchitis  -She has had a cough for to 3 weeks, x-ray last week showed Central mild bronchitis changes, no pneumonia. Her blood culture last week was negative -Her cough has significantly improved after course of azithromycin   3. Anorexia, constipation, abdominal pain -Likely secondary to chemotherapy, her symptoms has much improved lately. -I encouraged her to use nutrition supplement, such as Ensure or boost -Follow up with the dietitian -EGD was negative  3. Chronic Hepatitis B  -She was started on entecavir at [facility], but only received one months, she has not been able to refill it, she states her pharmacy is alternative for her and she has not heard back yet -My nurse called Dr. Araceli Bouche Phillips's office at [facility] who initially prescribed entecavir for her, to see if she needs a refill, and the medication can be called in at our [facility].  4. Normocytic anemia -Likely secondary to her acute plasma cell leukemia -improved. -We'll monitor closely.  5. Communication and Social support -She does not speak English, she is accompanied by interpreter to our cancer center -She has significant communication barrier, limited social support, and poor health literacy  -per pt, she does not have significant financial concerns about her medical care and treatment  Plan -C3D1 CyBorD today, and continue weekly -I'll see her back in 3 weeks   All questions were answered. The patient knows to  call the clinic with any problems, questions or concerns. I spent 20 minutes counseling the patient face to face. The total time spent in the appointment was 30 minutes and more than 50% was on counseling.     Truitt Merle, MD 12/15/2014 11:23 AM

## 2014-12-15 NOTE — Telephone Encounter (Signed)
Gave pt appt for 12/2 @ 9.30am.

## 2014-12-17 ENCOUNTER — Telehealth: Payer: Self-pay | Admitting: Hematology

## 2014-12-17 NOTE — Telephone Encounter (Signed)
Aware of appointment change on 11/18 per 11/11 pof

## 2014-12-22 ENCOUNTER — Other Ambulatory Visit: Payer: 59

## 2014-12-22 ENCOUNTER — Ambulatory Visit (HOSPITAL_BASED_OUTPATIENT_CLINIC_OR_DEPARTMENT_OTHER): Payer: 59

## 2014-12-22 ENCOUNTER — Other Ambulatory Visit: Payer: Self-pay | Admitting: *Deleted

## 2014-12-22 ENCOUNTER — Encounter: Payer: 59 | Admitting: Hematology

## 2014-12-22 ENCOUNTER — Other Ambulatory Visit (HOSPITAL_BASED_OUTPATIENT_CLINIC_OR_DEPARTMENT_OTHER): Payer: 59

## 2014-12-22 VITALS — BP 111/67 | HR 93 | Temp 97.0°F

## 2014-12-22 DIAGNOSIS — Z5112 Encounter for antineoplastic immunotherapy: Secondary | ICD-10-CM | POA: Diagnosis not present

## 2014-12-22 DIAGNOSIS — C9010 Plasma cell leukemia not having achieved remission: Secondary | ICD-10-CM | POA: Diagnosis not present

## 2014-12-22 DIAGNOSIS — Z5111 Encounter for antineoplastic chemotherapy: Secondary | ICD-10-CM

## 2014-12-22 LAB — CBC WITH DIFFERENTIAL/PLATELET
BASO%: 0.1 % (ref 0.0–2.0)
BASOS ABS: 0 10*3/uL (ref 0.0–0.1)
EOS%: 0.1 % (ref 0.0–7.0)
Eosinophils Absolute: 0 10*3/uL (ref 0.0–0.5)
HEMATOCRIT: 37.3 % (ref 34.8–46.6)
HEMOGLOBIN: 12.1 g/dL (ref 11.6–15.9)
LYMPH#: 0.4 10*3/uL — AB (ref 0.9–3.3)
LYMPH%: 5.7 % — ABNORMAL LOW (ref 14.0–49.7)
MCH: 29.3 pg (ref 25.1–34.0)
MCHC: 32.5 g/dL (ref 31.5–36.0)
MCV: 90.1 fL (ref 79.5–101.0)
MONO#: 0.1 10*3/uL (ref 0.1–0.9)
MONO%: 1.2 % (ref 0.0–14.0)
NEUT%: 92.9 % — ABNORMAL HIGH (ref 38.4–76.8)
NEUTROS ABS: 6.2 10*3/uL (ref 1.5–6.5)
Platelets: 284 10*3/uL (ref 145–400)
RBC: 4.14 10*6/uL (ref 3.70–5.45)
RDW: 16.7 % — AB (ref 11.2–14.5)
WBC: 6.7 10*3/uL (ref 3.9–10.3)

## 2014-12-22 LAB — COMPREHENSIVE METABOLIC PANEL (CC13)
ALBUMIN: 3.5 g/dL (ref 3.5–5.0)
ALK PHOS: 215 U/L — AB (ref 40–150)
ALT: 25 U/L (ref 0–55)
AST: 46 U/L — AB (ref 5–34)
Anion Gap: 8 mEq/L (ref 3–11)
BUN: 11.1 mg/dL (ref 7.0–26.0)
CALCIUM: 9.2 mg/dL (ref 8.4–10.4)
CO2: 26 mEq/L (ref 22–29)
CREATININE: 0.7 mg/dL (ref 0.6–1.1)
Chloride: 106 mEq/L (ref 98–109)
EGFR: >90 mL/min/{1.73_m2} (ref >90)
GLUCOSE: 116 mg/dL (ref 70–140)
Potassium: 4.1 mEq/L (ref 3.5–5.1)
SODIUM: 140 meq/L (ref 136–145)
TOTAL PROTEIN: 6.7 g/dL (ref 6.4–8.3)

## 2014-12-22 MED ORDER — SODIUM CHLORIDE 0.9 % IV SOLN
300.0000 mg/m2 | Freq: Once | INTRAVENOUS | Status: AC
  Administered 2014-12-22: 480 mg via INTRAVENOUS
  Filled 2014-12-22: qty 24

## 2014-12-22 MED ORDER — BORTEZOMIB CHEMO SQ INJECTION 3.5 MG (2.5MG/ML)
1.5000 mg/m2 | Freq: Once | INTRAMUSCULAR | Status: AC
  Administered 2014-12-22: 2.5 mg via SUBCUTANEOUS
  Filled 2014-12-22: qty 2.5

## 2014-12-22 MED ORDER — ONDANSETRON HCL 8 MG PO TABS
ORAL_TABLET | ORAL | Status: AC
  Filled 2014-12-22: qty 1

## 2014-12-22 MED ORDER — ONDANSETRON HCL 8 MG PO TABS
8.0000 mg | ORAL_TABLET | Freq: Once | ORAL | Status: AC
  Administered 2014-12-22: 8 mg via ORAL

## 2014-12-22 NOTE — Patient Instructions (Signed)
[facility] Discharge Instructions for Patients Receiving Chemotherapy  Today you received the following chemotherapy agents Cytoxan/Velcade  To help prevent nausea and vomiting after your treatment, we encourage you to take your nausea medication as prescribed.   If you develop nausea and vomiting that is not controlled by your nausea medication, call the clinic.   BELOW ARE SYMPTOMS THAT SHOULD BE REPORTED IMMEDIATELY:  *FEVER GREATER THAN 100.5 F  *CHILLS WITH OR WITHOUT FEVER  NAUSEA AND VOMITING THAT IS NOT CONTROLLED WITH YOUR NAUSEA MEDICATION  *UNUSUAL SHORTNESS OF BREATH  *UNUSUAL BRUISING OR BLEEDING  TENDERNESS IN MOUTH AND THROAT WITH OR WITHOUT PRESENCE OF ULCERS  *URINARY PROBLEMS  *BOWEL PROBLEMS  UNUSUAL RASH Items with * indicate a potential emergency and should be followed up as soon as possible.  Feel free to call the clinic you have any questions or concerns. The clinic phone number is [phone].  Please show the CHEMO ALERT CARD at check-in to the Emergency Department and triage nurse.   

## 2014-12-25 LAB — KAPPA/LAMBDA LIGHT CHAINS
KAPPA FREE LGHT CHN: 1.1 mg/dL (ref 0.33–1.94)
Kappa:Lambda Ratio: 0.42 (ref 0.26–1.65)
LAMBDA FREE LGHT CHN: 2.6 mg/dL (ref 0.57–2.63)

## 2014-12-26 ENCOUNTER — Other Ambulatory Visit: Payer: Self-pay | Admitting: *Deleted

## 2014-12-29 ENCOUNTER — Other Ambulatory Visit (HOSPITAL_BASED_OUTPATIENT_CLINIC_OR_DEPARTMENT_OTHER): Payer: 59

## 2014-12-29 ENCOUNTER — Other Ambulatory Visit: Payer: Self-pay | Admitting: Neurology

## 2014-12-29 ENCOUNTER — Ambulatory Visit (HOSPITAL_BASED_OUTPATIENT_CLINIC_OR_DEPARTMENT_OTHER): Payer: 59

## 2014-12-29 VITALS — BP 102/62 | HR 70 | Temp 97.6°F | Resp 18

## 2014-12-29 DIAGNOSIS — C9010 Plasma cell leukemia not having achieved remission: Secondary | ICD-10-CM | POA: Diagnosis not present

## 2014-12-29 DIAGNOSIS — Z5112 Encounter for antineoplastic immunotherapy: Secondary | ICD-10-CM | POA: Diagnosis not present

## 2014-12-29 DIAGNOSIS — Z5111 Encounter for antineoplastic chemotherapy: Secondary | ICD-10-CM | POA: Diagnosis not present

## 2014-12-29 LAB — CBC WITH DIFFERENTIAL/PLATELET
BASO%: 0.3 % (ref 0.0–2.0)
BASOS ABS: 0 10*3/uL (ref 0.0–0.1)
EOS ABS: 0 10*3/uL (ref 0.0–0.5)
EOS%: 1.1 % (ref 0.0–7.0)
HCT: 36.7 % (ref 34.8–46.6)
HGB: 11.8 g/dL (ref 11.6–15.9)
LYMPH%: 12.3 % — AB (ref 14.0–49.7)
MCH: 28.6 pg (ref 25.1–34.0)
MCHC: 32.2 g/dL (ref 31.5–36.0)
MCV: 88.9 fL (ref 79.5–101.0)
MONO#: 0.1 10*3/uL (ref 0.1–0.9)
MONO%: 3.3 % (ref 0.0–14.0)
NEUT#: 3.5 10*3/uL (ref 1.5–6.5)
NEUT%: 83 % — AB (ref 38.4–76.8)
PLATELETS: 245 10*3/uL (ref 145–400)
RBC: 4.12 10*6/uL (ref 3.70–5.45)
RDW: 16.6 % — ABNORMAL HIGH (ref 11.2–14.5)
WBC: 4.2 10*3/uL (ref 3.9–10.3)
lymph#: 0.5 10*3/uL — ABNORMAL LOW (ref 0.9–3.3)

## 2014-12-29 LAB — COMPREHENSIVE METABOLIC PANEL (CC13)
ALBUMIN: 3.5 g/dL (ref 3.5–5.0)
ALK PHOS: 197 U/L — AB (ref 40–150)
ALT: 26 U/L (ref 0–55)
ANION GAP: 8 meq/L (ref 3–11)
AST: 49 U/L — ABNORMAL HIGH (ref 5–34)
BILIRUBIN TOTAL: 0.37 mg/dL (ref 0.20–1.20)
BUN: 11.8 mg/dL (ref 7.0–26.0)
CO2: 26 meq/L (ref 22–29)
CREATININE: 0.6 mg/dL (ref 0.6–1.1)
Calcium: 9.3 mg/dL (ref 8.4–10.4)
Chloride: 106 mEq/L (ref 98–109)
EGFR: >90 mL/min/{1.73_m2} (ref >90)
Glucose: 109 mg/dl (ref 70–140)
Potassium: 3.9 mEq/L (ref 3.5–5.1)
Sodium: 141 mEq/L (ref 136–145)
TOTAL PROTEIN: 6.4 g/dL (ref 6.4–8.3)

## 2014-12-29 MED ORDER — ONDANSETRON HCL 8 MG PO TABS
ORAL_TABLET | ORAL | Status: AC
  Filled 2014-12-29: qty 1

## 2014-12-29 MED ORDER — ONDANSETRON HCL 8 MG PO TABS
8.0000 mg | ORAL_TABLET | Freq: Once | ORAL | Status: AC
  Administered 2014-12-29: 8 mg via ORAL

## 2014-12-29 MED ORDER — CYCLOPHOSPHAMIDE CHEMO INJECTION 1 GM
300.0000 mg/m2 | Freq: Once | INTRAMUSCULAR | Status: AC
  Administered 2014-12-29: 480 mg via INTRAVENOUS
  Filled 2014-12-29: qty 24

## 2014-12-29 MED ORDER — SODIUM CHLORIDE 0.9 % IJ SOLN
10.0000 mL | INTRAMUSCULAR | Status: DC | PRN
  Administered 2014-12-29: 10 mL via INTRAVENOUS
  Filled 2014-12-29: qty 10

## 2014-12-29 MED ORDER — BORTEZOMIB CHEMO SQ INJECTION 3.5 MG (2.5MG/ML)
1.5000 mg/m2 | Freq: Once | INTRAMUSCULAR | Status: AC
  Administered 2014-12-29: 2.5 mg via SUBCUTANEOUS
  Filled 2014-12-29: qty 2.5

## 2014-12-29 MED ORDER — SODIUM CHLORIDE 0.9 % IV SOLN
Freq: Once | INTRAVENOUS | Status: AC
  Administered 2014-12-29: 12:00:00 via INTRAVENOUS

## 2014-12-29 MED ORDER — HEPARIN SOD (PORK) LOCK FLUSH 100 UNIT/ML IV SOLN
500.0000 [IU] | Freq: Once | INTRAVENOUS | Status: AC
Start: 2014-12-29 — End: 2014-12-29
  Administered 2014-12-29: 500 [IU] via INTRAVENOUS
  Filled 2014-12-29: qty 5

## 2014-12-29 NOTE — Patient Instructions (Signed)
[facility] Discharge Instructions for Patients Receiving Chemotherapy  Today you received the following chemotherapy agents velcade, cytoxan  To help prevent nausea and vomiting after your treatment, we encourage you to take your nausea medication    If you develop nausea and vomiting that is not controlled by your nausea medication, call the clinic.   BELOW ARE SYMPTOMS THAT SHOULD BE REPORTED IMMEDIATELY:  *FEVER GREATER THAN 100.5 F  *CHILLS WITH OR WITHOUT FEVER  NAUSEA AND VOMITING THAT IS NOT CONTROLLED WITH YOUR NAUSEA MEDICATION  *UNUSUAL SHORTNESS OF BREATH  *UNUSUAL BRUISING OR BLEEDING  TENDERNESS IN MOUTH AND THROAT WITH OR WITHOUT PRESENCE OF ULCERS  *URINARY PROBLEMS  *BOWEL PROBLEMS  UNUSUAL RASH Items with * indicate a potential emergency and should be followed up as soon as possible.  Feel free to call the clinic you have any questions or concerns. The clinic phone number is [phone].  Please show the CHEMO ALERT CARD at check-in to the Emergency Department and triage nurse.   

## 2015-01-05 ENCOUNTER — Ambulatory Visit (HOSPITAL_BASED_OUTPATIENT_CLINIC_OR_DEPARTMENT_OTHER): Payer: 59

## 2015-01-05 ENCOUNTER — Encounter: Payer: Self-pay | Admitting: Hematology

## 2015-01-05 ENCOUNTER — Telehealth: Payer: Self-pay | Admitting: Hematology

## 2015-01-05 ENCOUNTER — Other Ambulatory Visit: Payer: 59

## 2015-01-05 ENCOUNTER — Ambulatory Visit: Payer: 59

## 2015-01-05 ENCOUNTER — Ambulatory Visit (HOSPITAL_BASED_OUTPATIENT_CLINIC_OR_DEPARTMENT_OTHER): Payer: 59 | Admitting: Hematology

## 2015-01-05 ENCOUNTER — Other Ambulatory Visit (HOSPITAL_BASED_OUTPATIENT_CLINIC_OR_DEPARTMENT_OTHER): Payer: 59

## 2015-01-05 ENCOUNTER — Telehealth: Payer: Self-pay | Admitting: *Deleted

## 2015-01-05 VITALS — BP 119/67 | HR 81 | Temp 98.0°F | Resp 18 | Ht 63.0 in | Wt 136.9 lb

## 2015-01-05 DIAGNOSIS — Z5112 Encounter for antineoplastic immunotherapy: Secondary | ICD-10-CM | POA: Diagnosis not present

## 2015-01-05 DIAGNOSIS — D649 Anemia, unspecified: Secondary | ICD-10-CM

## 2015-01-05 DIAGNOSIS — C9010 Plasma cell leukemia not having achieved remission: Secondary | ICD-10-CM

## 2015-01-05 DIAGNOSIS — B181 Chronic viral hepatitis B without delta-agent: Secondary | ICD-10-CM | POA: Diagnosis not present

## 2015-01-05 LAB — COMPREHENSIVE METABOLIC PANEL
ALBUMIN: 3.5 g/dL (ref 3.5–5.0)
ALK PHOS: 191 U/L — AB (ref 40–150)
ALT: 20 U/L (ref 0–55)
ANION GAP: 9 meq/L (ref 3–11)
AST: 42 U/L — ABNORMAL HIGH (ref 5–34)
BUN: 7.6 mg/dL (ref 7.0–26.0)
CO2: 25 meq/L (ref 22–29)
CREATININE: 0.7 mg/dL (ref 0.6–1.1)
Calcium: 9.3 mg/dL (ref 8.4–10.4)
Chloride: 108 mEq/L (ref 98–109)
EGFR: >90 mL/min/{1.73_m2} (ref >90)
Glucose: 82 mg/dl (ref 70–140)
Potassium: 3.8 mEq/L (ref 3.5–5.1)
Sodium: 142 mEq/L (ref 136–145)
TOTAL PROTEIN: 6.3 g/dL — AB (ref 6.4–8.3)
Total Bilirubin: <0.3 mg/dL (ref 0.20–1.20)

## 2015-01-05 LAB — CBC WITH DIFFERENTIAL/PLATELET
BASO%: 0.6 % (ref 0.0–2.0)
Basophils Absolute: 0 10*3/uL (ref 0.0–0.1)
EOS ABS: 0.1 10*3/uL (ref 0.0–0.5)
EOS%: 1.6 % (ref 0.0–7.0)
HEMATOCRIT: 35.8 % (ref 34.8–46.6)
HEMOGLOBIN: 11.5 g/dL — AB (ref 11.6–15.9)
LYMPH#: 0.5 10*3/uL — AB (ref 0.9–3.3)
LYMPH%: 14.6 % (ref 14.0–49.7)
MCH: 28.6 pg (ref 25.1–34.0)
MCHC: 32.1 g/dL (ref 31.5–36.0)
MCV: 89.3 fL (ref 79.5–101.0)
MONO#: 0.7 10*3/uL (ref 0.1–0.9)
MONO%: 18.7 % — ABNORMAL HIGH (ref 0.0–14.0)
NEUT%: 64.5 % (ref 38.4–76.8)
NEUTROS ABS: 2.3 10*3/uL (ref 1.5–6.5)
PLATELETS: 204 10*3/uL (ref 145–400)
RBC: 4.02 10*6/uL (ref 3.70–5.45)
RDW: 16.3 % — AB (ref 11.2–14.5)
WBC: 3.5 10*3/uL — AB (ref 3.9–10.3)

## 2015-01-05 MED ORDER — BORTEZOMIB CHEMO SQ INJECTION 3.5 MG (2.5MG/ML)
1.5000 mg/m2 | Freq: Once | INTRAMUSCULAR | Status: AC
  Administered 2015-01-05: 2.5 mg via SUBCUTANEOUS
  Filled 2015-01-05: qty 2.5

## 2015-01-05 MED ORDER — ONDANSETRON HCL 8 MG PO TABS
ORAL_TABLET | ORAL | Status: AC
  Filled 2015-01-05: qty 1

## 2015-01-05 MED ORDER — ONDANSETRON HCL 8 MG PO TABS
8.0000 mg | ORAL_TABLET | Freq: Once | ORAL | Status: AC
  Administered 2015-01-05: 8 mg via ORAL

## 2015-01-05 MED ORDER — CYCLOPHOSPHAMIDE CHEMO INJECTION 1 GM
300.0000 mg/m2 | Freq: Once | INTRAMUSCULAR | Status: AC
  Administered 2015-01-05: 480 mg via INTRAVENOUS
  Filled 2015-01-05: qty 24

## 2015-01-05 NOTE — Telephone Encounter (Signed)
per pof to add MD-gave pt copy of avs

## 2015-01-05 NOTE — Telephone Encounter (Signed)
Spoke with [name] Ace, RN @ [facility] and was informed : 1.  Pt was given Entecavir while in the hospital.  In order for medication to be refilled, pt will need to see Infectious Disease MD to determine how long pt will need to be on this medication. Tanya did not see a referral yet.  Per Lavella Lemons, she will make a referral to Infectious Disease, and her scheduler will contact pt with appt.  Lavella Lemons understood that pt will need appt soon due to pt will be out of medication. Tanya's   Phone    [phone].

## 2015-01-05 NOTE — Patient Instructions (Signed)
[facility] Discharge Instructions for Patients Receiving Chemotherapy  Today you received the following chemotherapy agents Velcade and Cytoxan.  To help prevent nausea and vomiting after your treatment, we encourage you to take your nausea medication.   If you develop nausea and vomiting that is not controlled by your nausea medication, call the clinic.   BELOW ARE SYMPTOMS THAT SHOULD BE REPORTED IMMEDIATELY:  *FEVER GREATER THAN 100.5 F  *CHILLS WITH OR WITHOUT FEVER  NAUSEA AND VOMITING THAT IS NOT CONTROLLED WITH YOUR NAUSEA MEDICATION  *UNUSUAL SHORTNESS OF BREATH  *UNUSUAL BRUISING OR BLEEDING  TENDERNESS IN MOUTH AND THROAT WITH OR WITHOUT PRESENCE OF ULCERS  *URINARY PROBLEMS  *BOWEL PROBLEMS  UNUSUAL RASH Items with * indicate a potential emergency and should be followed up as soon as possible.  Feel free to call the clinic you have any questions or concerns. The clinic phone number is [phone].  Please show the CHEMO ALERT CARD at check-in to the Emergency Department and triage nurse.   

## 2015-01-05 NOTE — Progress Notes (Signed)
[place]  [phone] Fax:(336) [phone]  Clinic Follow up Note   Patient Care Team: [name], MD as PCP - General (Family Medicine) [name], MD as Referring Physician (Specialist) [name], MD as Referring Physician (Specialist) 01/05/2015    CHIEF COMPLAINTS:  Follow up acute plasma cell leukemia   HISTORY OF PRESENTING ILLNESS:  Sheryl [name] 44 y.o. female is here because of recently diagnosed plasma cell leukemia. She was recently discharged form [facility] 3 days ago and is here to establish her local oncological care with [place]. She is a [place], does not speak [place], she is accompanied to the clinic by her daughter and interpreter.  She presented to our hospital lth with worsening dyspnea, fatigue, cough, subjective fevers and chills, and was admitted on 09/14/2014. She was found to have allergy WBC 54.1K, hemoglobin 8.1, plt 310, she was treated with symptom management, and was discharged home on 09/17/2014, with a plan to follow-up with hematology. She represents to emergency room on 10/07/2014, was found to have white count of 78K, and worsening anemia with hemoglobin 6. She was seen by my partner Dr. Jonette Eva and plasma cell leukemia was suspected. She was transferred to [facility] for further leukemia work-up and treatment. Bone marrow biopsy was done, which confirmed plasma cell leukemia, and she started chemotherapy with Velcade, Cytoxan and dexamethasone. She received weekly dose twice, last dose on 10/20/2014. She was discharged to home afterwards. She had a mediport placed during her hospitalization.  She has moderate fatigue, low appetie, she lost about 13 lbs in the past few months. She has moderate abdominal pain, 5-6/10, persistent, she does not take any pain meds.   I reviewed her medical records extensively, and discussed her case with Dr. Jorje Guild and [place] program coordinator.  CURRENT  THERAPY: Cytoxan 300 mg/m, IV, bortezomib 1.5 mg/m subcutaneous, and dexamethasone 40 milligrams, weekly, every 4 weeks, started on 10/13/2014 at [facility], received bortezomib twice a week for the first 4 weeks  INTERIM HISTORY: Sheryl [name] returns for follow up and weekly chemo. She is doing well overall. Her appetite and energy level has improved in the past months, her cough is resolved. She does complain about leg cramps and muscular tightness, no leg swelling or other new complains. She is tolerating chemotherapy treatment very well.  MEDICAL HISTORY:  Past Medical History  Diagnosis Date  . GERD (gastroesophageal reflux disease)   . Hepatitis   . History of positive PPD     DX 2011--  CXR DONE NO EVIDENCE  . Hydronephrosis, right   . Right ureteral stone   . History of ureter stent   . Chills with fever     intermittently since d/c from hospital  . Dysuria-frequency syndrome     w/ pink urine  . Urosepsis 8/14    admitted to [facility]  . Pneumonia   . Neuromuscular disorder (HCC)     legs numb intermittently    SURGICAL HISTORY: Past Surgical History  Procedure Laterality Date  . Cystoscopy w/ ureteral stent placement Right 09/25/2012    Procedure: CYSTOSCOPY WITH RETROGRADE PYELOGRAM/URETERAL STENT PLACEMENT;  Surgeon: Alexis Frock, MD;  Location: [facility];  Service: Urology;  Laterality: Right;  . Liver biopsy    . Right vats w/ drainage peural effusion and bx's  10-30-2008  . Removal of uterine cyst      years ago  . Other surgical history Right     removal of ovarian cyst  .  Cystoscopy with retrograde pyelogram, ureteroscopy and stent placement Right 10/15/2012    Procedure: CYSTOSCOPY WITH RETROGRADE PYELOGRAM, URETEROSCOPY AND REMOVAL STENT WITH  STENT PLACEMENT;  Surgeon: Alexis Frock, MD;  Location: [facility];  Service: Urology;  Laterality: Right;  . Holmium laser application Right 05/01/9240    Procedure: HOLMIUM LASER APPLICATION;  Surgeon: Alexis Frock,  MD;  Location: [facility];  Service: Urology;  Laterality: Right;  . Esophagogastroduodenoscopy (egd) with propofol N/A 11/16/2014    Procedure: ESOPHAGOGASTRODUODENOSCOPY (EGD) WITH PROPOFOL;  Surgeon: Milus Banister, MD;  Location: [facility];  Service: Endoscopy;  Laterality: N/A;    SOCIAL HISTORY: Social History   Social History  . Marital Status: Single    Spouse Name: N/A  . Number of Children: 2  . Years of Education: N/A   Occupational History  . She used to work at a [employer]    Social History Main Topics  . Smoking status: Never Smoker   . Smokeless tobacco: Not on file  . Alcohol Use: No  . Drug Use: No  . Sexual Activity: Not Currently    Birth Control/ Protection: Abstinence   Other Topics Concern  . Not on file   Social History Narrative    FAMILY HISTORY: Family History  Problem Relation Age of Onset  . Stomach cancer Mother   . Lung disease Father   . Asthma Father     ALLERGIES:  has No Known Allergies.  MEDICATIONS:  Current Outpatient Prescriptions  Medication Sig Dispense Refill  . acyclovir (ZOVIRAX) 400 MG tablet Take 1 tablet (400 mg total) by mouth 2 (two) times daily. 60 tablet 3  . dexamethasone (DECADRON) 4 MG tablet Take 10 tablets (40 mg total) by mouth once a week. On the day of chemo treatment 40 tablet 2  . entecavir (BARACLUDE) 0.5 MG tablet Take 0.5 mg by mouth daily.     Marland Kitchen lidocaine-prilocaine (EMLA) cream Apply 1 application topically as needed. (Patient taking differently: Apply 1 application topically as needed (port). ) 30 g 6  . omeprazole (PRILOSEC) 20 MG capsule Take 1 capsule (20 mg total) by mouth daily. 30 capsule 2  . polyethylene glycol (MIRALAX / GLYCOLAX) packet Take 17 g by mouth as needed for moderate constipation.      No current facility-administered medications for this visit.    REVIEW OF SYSTEMS:   Constitutional: Denies fevers, chills or abnormal night sweats Eyes: Denies blurriness  of vision, double vision or watery eyes Ears, nose, mouth, throat, and face: Denies mucositis or sore throat Respiratory: Denies cough, dyspnea or wheezes Cardiovascular: Denies palpitation, chest discomfort or lower extremity swelling Gastrointestinal:  Denies nausea, heartburn or change in bowel habits Skin: Denies abnormal skin rashes Lymphatics: Denies new lymphadenopathy or easy bruising Neurological:Denies numbness, tingling or new weaknesses Behavioral/Psych: Mood is stable, no new changes  All other systems were reviewed with the patient and are negative.  PHYSICAL EXAMINATION: ECOG PERFORMANCE STATUS: 1  Filed Vitals:   01/05/15 1024  BP: 119/67  Pulse: 81  Temp: 98 F (36.7 C)  Resp: 18   Filed Weights   01/05/15 1024  Weight: 136 lb 14.4 oz (62.097 kg)    GENERAL:alert, no distress and comfortable SKIN: skin color, texture, turgor are normal, no rashes or significant lesions EYES: normal, conjunctiva are pink and non-injected, sclera clear OROPHARYNX:no exudate, no erythema and lips, buccal mucosa, and tongue normal  NECK: supple, thyroid normal size, non-tender, without nodularity LYMPH:  no  palpable lymphadenopathy in the cervical, axillary or inguinal LUNGS: clear to auscultation and percussion with normal breathing effort HEART: regular rate & rhythm and no murmurs and no lower extremity edema ABDOMEN:abdomen soft, non-tender and normal bowel sounds Musculoskeletal:no cyanosis of digits and no clubbing  PSYCH: alert & oriented x 3 with fluent speech NEURO: no focal motor/sensory deficits  LABORATORY DATA:  I have reviewed the data as listed CBC Latest Ref Rng 01/05/2015 12/29/2014 12/22/2014  WBC 3.9 - 10.3 10e3/uL 3.5(L) 4.2 6.7  Hemoglobin 11.6 - 15.9 g/dL 11.5(L) 11.8 12.1  Hematocrit 34.8 - 46.6 % 35.8 36.7 37.3  Platelets 145 - 400 10e3/uL 204 245 284    CMP Latest Ref Rng 01/05/2015 12/29/2014 12/22/2014  Glucose 70 - 140 mg/dl 82 109 116  BUN 7.0  - 26.0 mg/dL 7.6 11.8 11.1  Creatinine 0.6 - 1.1 mg/dL 0.7 0.6 0.7  Sodium 136 - 145 mEq/L 142 141 140  Potassium 3.5 - 5.1 mEq/L 3.8 3.9 4.1  Chloride 101 - 111 mmol/L - - -  CO2 22 - 29 mEq/L '25 26 26  ' Calcium 8.4 - 10.4 mg/dL 9.3 9.3 9.2  Total Protein 6.4 - 8.3 g/dL 6.3(L) 6.4 6.7  Total Bilirubin 0.20 - 1.20 mg/dL <0.30 0.37 <0.30  Alkaline Phos 40 - 150 U/L 191(H) 197(H) 215(H)  AST 5 - 34 U/L 42(H) 49(H) 46(H)  ALT 0 - 55 U/L '20 26 25     ' SPEP M-protein  09/15/2014: 4.2 10/08/14: 4.6 12/08/2014: 0.2  IgG mg/dl 11/03/2014: 3150  12/08/2014:  759  Kappa/lambda light chains levels and ration  11/03/14: 0.75, 152, 0.00 12/22/2014: 1.10, 2.60, 0.42  24 h urine UPEP/IFE and light chain: 11/03/2014: IFE showed a monoclonal IgG heavy chain with associated lambda light chain. M protein was Undetectable    PATHOLOGY REPORT  Bone Marrow (BM) and Peripheral Blood (PB) 10/10/2014 [facility])  FINAL PATHOLOGIC DIAGNOSIS      BONE MARROW AND PERIPHERAL BLOOD:  Extensive involvement by plasma cell myeloma/leukemia.  See comment and CBC data.      COMMENT:  The bone marrow aspirate smears are spicular, cellular and  adequate for evaluation. Atypical plasma cells comprise 57% of  the cellularity (500 cell manual differential count). The plasma  cells have enlarged nuclei with scattered binucleated forms and  irregular nuclear contour. There is diminished multilineage  hematopoiesis with adequate maturation. Blasts are not increased  in numbers (less than 1% by manual differential counts). 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 There is no overt dysplasia of the  myeloid or erythroid lineages. Megakaryocytes are quantitatively  and qualitatively unremarkable. There is no lymphocytosis.    Examination of the bone marrow core biopsy reveals a markedly  hypercellular marrow for age (95%) with  near complete replacement  by sheets of plasma cells. Rare myeloid and erythroid elements  are present. Megakaryocytes are sparse and focally clustered with  hypolobated and hyperchromatic nuclei. There are no atypical  lymphoid aggregates or granulomas identified. Examination of the  bone marrow clot section reveals similar findings.    The peripheral blood demonstrates a leukocytosis comprised of  large plasma cells and normocytic anemia. Rouleaux is present.  There is a myeloid left shift with rare circulating blasts.    By flow cytometry on the peripheral blood, the abnormal plasma  cell population comprises 64% of the cellular elements and  expresses cytoplasmic CD138, CD45 and cLambda with dim expression  of CD19. The plasma cells are negative for surface CD2, CD3, CD4,  CD5, CD7, CD8, CD56, CD10, CD20, sKappa, sLambda, HLA-DR and  cKappa.    Overall, the findings demonstrate extensive involvement by plasma  cell myeloma/leukemia.     RADIOGRAPHIC STUDIES: I have personally reviewed the radiological images as listed and agreed with the findings in the report. No results found.  ASSESSMENT & PLAN: 44 year old [place], presented with anemia and leokocytosis   1. Acute plasma cell leukemia -She has started cyborD at [facility] in early Sep 2016 -She is on 4th cycle CyBorD (weekly) now, tolerating well. -She is being followed by Dr. 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 Her next appointment is on 01/10/2015  -continue acyclovir when she is on Velcade -She has had good response to induction chemotherapy. Her white blood count has come down to normal, no leukemia cells on the peripheral blood. Repeated SPEP showed M protein has dropped to 0.2 from pretreatment 4.6 -Lab reviewed, adequate for treatment, she'll proceed treatment today   2. Anorexia, constipation, abdominal pain -Likely secondary to dexa and chemotherapy, her  symptoms has much improved lately, abdominal pain resolved  -I encouraged her to use nutrition supplement, such as Ensure or boost -Follow up with the dietitian -EGD was negative, on PPI due to steroids   3. Chronic Hepatitis B  -She was started on entecavir at [facility], but only received one months, she does have one refill, but has not been able to refill it, she states her pharmacy has ordered for her but  she has not heard back yet -My nurse called [facility] again today and regarding her follow up appointment and refill, they will make a referral to ID, and contact her for appointment.  -alternatively, I can refer her to local ID if she can not follow up at [facility]   4. Normocytic anemia -Likely secondary to her acute plasma cell leukemia -improved. -We'll monitor closely.  5. Communication and Social support -She does not speak [place], she is accompanied by interpreter to our cancer center -She has significant communication barrier, limited social support, and poor health literacy  -per pt, she does not have significant financial concerns about her medical care and treatment  Plan -C4D8 CyBorD today, and continue weekly -she will see Dr Tiana Loft next week on December 7 -I'll see her back in one week -she will be referred to ID at [place] questions were answered. The patient knows to call the clinic with any problems, questions or concerns. I spent 20 minutes counseling the patient face to face. The total time spent in the appointment was 30 minutes and more than 50% was on counseling.     Truitt Merle, MD 01/05/2015 5:32 PM

## 2015-01-08 ENCOUNTER — Telehealth: Payer: Self-pay | Admitting: Hematology

## 2015-01-08 ENCOUNTER — Telehealth: Payer: Self-pay | Admitting: *Deleted

## 2015-01-08 NOTE — Telephone Encounter (Signed)
per pof to CX pt referral for GI-CX

## 2015-01-08 NOTE — Telephone Encounter (Signed)
Received ph call from [facility] re pt's med entacivir.  She reports that a referral was made to infectious ds MD, Dr Stephens November & pt is being seen today  & they will determine if pt is to cont. this drug.  Apparently Dr Shirleen Schirmer prescribed it inpt.

## 2015-01-10 ENCOUNTER — Telehealth: Payer: Self-pay | Admitting: *Deleted

## 2015-01-10 NOTE — Telephone Encounter (Signed)
FYI  Call from Dr. Eugenie Birks at [facility].  I'm about to see a patient of Dr. Ernestina Penna and would like to know the most recent myeloma results."  This nurse provided results of 12-22-2014 Kappa Lamda Light chain and 12-08-2014 SPEP IFE with [facility]IG.  No further results requested.  Added provider name to care team list at this time.

## 2015-01-11 ENCOUNTER — Telehealth: Payer: Self-pay | Admitting: *Deleted

## 2015-01-11 NOTE — Telephone Encounter (Signed)
Spoke with Wells Guiles, RN @ [facility] and informed her re:  Dr. Burr Medico had communicated with Dr. Norma Fredrickson yesterday about pt's status.   Faxed last office notes, lab results, scan, and chemo flow sheets to [facility] for Dr. Norma Fredrickson to review. [facility]   Phone     [phone]     ;     Fax      [phone].

## 2015-01-12 ENCOUNTER — Telehealth: Payer: Self-pay | Admitting: Hematology

## 2015-01-12 ENCOUNTER — Other Ambulatory Visit: Payer: 59

## 2015-01-12 ENCOUNTER — Other Ambulatory Visit (HOSPITAL_BASED_OUTPATIENT_CLINIC_OR_DEPARTMENT_OTHER): Payer: 59

## 2015-01-12 ENCOUNTER — Ambulatory Visit (HOSPITAL_BASED_OUTPATIENT_CLINIC_OR_DEPARTMENT_OTHER): Payer: 59

## 2015-01-12 ENCOUNTER — Ambulatory Visit (HOSPITAL_BASED_OUTPATIENT_CLINIC_OR_DEPARTMENT_OTHER): Payer: 59 | Admitting: Hematology

## 2015-01-12 ENCOUNTER — Encounter: Payer: Self-pay | Admitting: Hematology

## 2015-01-12 ENCOUNTER — Ambulatory Visit: Payer: 59

## 2015-01-12 VITALS — BP 113/73 | HR 82 | Temp 97.4°F | Resp 18 | Ht 63.0 in | Wt 136.0 lb

## 2015-01-12 DIAGNOSIS — D649 Anemia, unspecified: Secondary | ICD-10-CM | POA: Diagnosis not present

## 2015-01-12 DIAGNOSIS — R630 Anorexia: Secondary | ICD-10-CM

## 2015-01-12 DIAGNOSIS — C9010 Plasma cell leukemia not having achieved remission: Secondary | ICD-10-CM | POA: Diagnosis not present

## 2015-01-12 DIAGNOSIS — Z5111 Encounter for antineoplastic chemotherapy: Secondary | ICD-10-CM | POA: Diagnosis not present

## 2015-01-12 DIAGNOSIS — Z5112 Encounter for antineoplastic immunotherapy: Secondary | ICD-10-CM | POA: Diagnosis not present

## 2015-01-12 DIAGNOSIS — B1910 Unspecified viral hepatitis B without hepatic coma: Secondary | ICD-10-CM

## 2015-01-12 DIAGNOSIS — K5900 Constipation, unspecified: Secondary | ICD-10-CM

## 2015-01-12 DIAGNOSIS — Z95828 Presence of other vascular implants and grafts: Secondary | ICD-10-CM

## 2015-01-12 LAB — CBC WITH DIFFERENTIAL/PLATELET
BASO%: 0.6 % (ref 0.0–2.0)
BASOS ABS: 0 10*3/uL (ref 0.0–0.1)
EOS ABS: 0.1 10*3/uL (ref 0.0–0.5)
EOS%: 1.5 % (ref 0.0–7.0)
HCT: 34.6 % — ABNORMAL LOW (ref 34.8–46.6)
HEMOGLOBIN: 11.3 g/dL — AB (ref 11.6–15.9)
LYMPH%: 21.3 % (ref 14.0–49.7)
MCH: 28.7 pg (ref 25.1–34.0)
MCHC: 32.6 g/dL (ref 31.5–36.0)
MCV: 88.1 fL (ref 79.5–101.0)
MONO#: 0.4 10*3/uL (ref 0.1–0.9)
MONO%: 11.5 % (ref 0.0–14.0)
NEUT#: 2.3 10*3/uL (ref 1.5–6.5)
NEUT%: 65.1 % (ref 38.4–76.8)
Platelets: 199 10*3/uL (ref 145–400)
RBC: 3.93 10*6/uL (ref 3.70–5.45)
RDW: 16.3 % — ABNORMAL HIGH (ref 11.2–14.5)
WBC: 3.5 10*3/uL — ABNORMAL LOW (ref 3.9–10.3)
lymph#: 0.7 10*3/uL — ABNORMAL LOW (ref 0.9–3.3)

## 2015-01-12 LAB — COMPREHENSIVE METABOLIC PANEL
ALBUMIN: 3.5 g/dL (ref 3.5–5.0)
ALK PHOS: 186 U/L — AB (ref 40–150)
ALT: 17 U/L (ref 0–55)
AST: 41 U/L — ABNORMAL HIGH (ref 5–34)
Anion Gap: 10 mEq/L (ref 3–11)
BUN: 9.3 mg/dL (ref 7.0–26.0)
CO2: 22 mEq/L (ref 22–29)
Calcium: 9 mg/dL (ref 8.4–10.4)
Chloride: 107 mEq/L (ref 98–109)
Creatinine: 0.7 mg/dL (ref 0.6–1.1)
GLUCOSE: 191 mg/dL — AB (ref 70–140)
POTASSIUM: 3.6 meq/L (ref 3.5–5.1)
SODIUM: 140 meq/L (ref 136–145)
Total Bilirubin: <0.3 mg/dL (ref 0.20–1.20)
Total Protein: 6.5 g/dL (ref 6.4–8.3)

## 2015-01-12 MED ORDER — SODIUM CHLORIDE 0.9 % IJ SOLN
10.0000 mL | INTRAMUSCULAR | Status: DC | PRN
  Administered 2015-01-12: 10 mL via INTRAVENOUS
  Filled 2015-01-12: qty 10

## 2015-01-12 MED ORDER — SODIUM CHLORIDE 0.9 % IV SOLN
INTRAVENOUS | Status: DC
  Administered 2015-01-12: 12:00:00 via INTRAVENOUS

## 2015-01-12 MED ORDER — POLYETHYLENE GLYCOL 3350 17 G PO PACK: 17.0000 g | PACK | ORAL | Status: DC | PRN

## 2015-01-12 MED ORDER — ONDANSETRON HCL 8 MG PO TABS
8.0000 mg | ORAL_TABLET | Freq: Once | ORAL | Status: AC
  Administered 2015-01-12: 8 mg via ORAL

## 2015-01-12 MED ORDER — HEPARIN SOD (PORK) LOCK FLUSH 100 UNIT/ML IV SOLN
500.0000 [IU] | Freq: Once | INTRAVENOUS | Status: AC
  Administered 2015-01-12: 500 [IU] via INTRAVENOUS
  Filled 2015-01-12: qty 5

## 2015-01-12 MED ORDER — OMEPRAZOLE 20 MG PO CPDR: 20.0000 mg | DELAYED_RELEASE_CAPSULE | Freq: Every day | ORAL | Status: DC

## 2015-01-12 MED ORDER — ONDANSETRON HCL 8 MG PO TABS
ORAL_TABLET | ORAL | Status: AC
  Filled 2015-01-12: qty 1

## 2015-01-12 MED ORDER — SODIUM CHLORIDE 0.9 % IV SOLN
300.0000 mg/m2 | Freq: Once | INTRAVENOUS | Status: AC
  Administered 2015-01-12: 480 mg via INTRAVENOUS
  Filled 2015-01-12: qty 24

## 2015-01-12 MED ORDER — BORTEZOMIB CHEMO SQ INJECTION 3.5 MG (2.5MG/ML)
1.5000 mg/m2 | Freq: Once | INTRAMUSCULAR | Status: AC
  Administered 2015-01-12: 2.5 mg via SUBCUTANEOUS
  Filled 2015-01-12: qty 2.5

## 2015-01-12 NOTE — Patient Instructions (Signed)
[facility] Discharge Instructions for Patients Receiving Chemotherapy  Today you received the following chemotherapy agents Cytoxan/Velcade.  To help prevent nausea and vomiting after your treatment, we encourage you to take your nausea medication as directed.   If you develop nausea and vomiting that is not controlled by your nausea medication, call the clinic.   BELOW ARE SYMPTOMS THAT SHOULD BE REPORTED IMMEDIATELY:  *FEVER GREATER THAN 100.5 F  *CHILLS WITH OR WITHOUT FEVER  NAUSEA AND VOMITING THAT IS NOT CONTROLLED WITH YOUR NAUSEA MEDICATION  *UNUSUAL SHORTNESS OF BREATH  *UNUSUAL BRUISING OR BLEEDING  TENDERNESS IN MOUTH AND THROAT WITH OR WITHOUT PRESENCE OF ULCERS  *URINARY PROBLEMS  *BOWEL PROBLEMS  UNUSUAL RASH Items with * indicate a potential emergency and should be followed up as soon as possible.  Feel free to call the clinic you have any questions or concerns. The clinic phone number is [phone].  Please show the [place] at check-in to the Emergency Department and triage nurse.

## 2015-01-12 NOTE — Telephone Encounter (Signed)
Gave and printed appt sched and avs fo rpt for DEC and jan  °

## 2015-01-12 NOTE — Patient Instructions (Signed)

## 2015-01-12 NOTE — Progress Notes (Signed)
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   10/10/2014 Miscellaneous Peripheral blood chemistry and leukocytosis with total white count 78K, comprised of large plasma cells and his normocytic anemia. There is a myeloid left shift with previous surgical radium blasts. Flow cytometry showed 64% plasma cells   10/10/2014 Bone Marrow Biopsy Markedly hypercellular marrow (95%), Atypical plasma cells comprise 57% of the cellularity. There was diminished multilineage in hematopoiesis with adequate maturation. Breasts less than 1%), no overt dysplasia of the myeloid or erythroid lineages.    10/10/2014 Initial Diagnosis Plasma cell leukemia   10/13/2014 -  Chemotherapy CyborD (cytoxan 332m/m2 iv, bortezomib 1.5 mg/m, dexamethasone 40 mg, weekly every 28 days, bortezomib and dexamethasone was given twice weekly for 2 weeks during the first cycle)    HISTORY OF PRESENTING ILLNESS:  Hessie Bicking 44y.o. female is here because of recently diagnosed plasma cell leukemia. [facility] and is here to establish her local oncological care with [place] speak [place] she is accompanied to the clinic by her daughter and interpreter.  She presented to our hospital lth with worsening dyspnea, fatigue, cough, subjective fevers and chills, and was admitted on 09/14/2014. She was found to  have allergy WBC 54.1K, hemoglobin 8.1, plt 310, she was treated with symptom management, and was discharged home on 09/17/2014, with a plan to follow-up with hematology. She represents to emergency room on 10/07/2014, was found to have white count of 78K, and worsening anemia with hemoglobin 6. She was seen by my partner Dr. EJonette Evaand plasma cell leukemia was suspected. [facility] Bone marrow biopsy was done, which confirmed plasma cell leukemia, and she started chemotherapy with Velcade, Cytoxan and dexamethasone. She received weekly dose twice, last dose on 10/20/2014. She was discharged to home afterwards. She had a mediport placed during her hospitalization.  She has moderate fatigue, low appetie, she lost about 13 lbs in the past few months. She has moderate abdominal pain, 5-6/10, persistent, she does not take any pain meds.   I reviewed her medical records extensively, and discussed her case with Dr. EJorje Guildand [facility] coordinator.  CURRENT THERAPY: Cytoxan 300 mg/m, IV, bortezomib 1.5 mg/m subcutaneous, and dexamethasone 40 milligrams, weekly, every 4 weeks, [facility] bortez[facility]ib twice a week for the first 4 weeks  INTERIM HISTORY: [place] returns for follow up and weekly chemo. [facility]. The plan is to proceed with transplant in mid January. She has been tolerating treatment well, mild fatigue is stable, no dyspnea or other symptoms. She occasionally has low back and leg pain, which she contributes to chemotherapy, no nausea or other complains.   MEDICAL HISTORY:  Past Medical History  Diagnosis Date  . GERD (gastroesophageal reflux disease)   . Hepatitis   . History of positive PPD  DX 2011--  CXR DONE NO EVIDENCE  . Hydronephrosis, right   . Right ureteral stone   . History of ureter stent   . Chills with fever      intermittently since d/c from hospital  . Dysuria-frequency syndrome     w/ pink urine  . Urosepsis 8/14    admitted to [facility]  . Pneumonia   . Neuromuscular disorder (HCC)     legs numb intermittently    SURGICAL HISTORY: Past Surgical History  Procedure Laterality Date  . Cystoscopy w/ ureteral stent placement Right 09/25/2012    Procedure: CYSTOSCOPY WITH RETROGRADE PYELOGRAM/URETERAL STENT PLACEMENT;  Surgeon: Alexis Frock, MD;  Location: [facility];  Service: Urology;  Laterality: Right;  . Liver biopsy    . Right vats w/ drainage peural effusion and bx's  10-30-2008  . Removal of uterine cyst      years ago  . Other surgical history Right     removal of ovarian cyst  . Cystoscopy with retrograde pyelogram, ureteroscopy and stent placement Right 10/15/2012    Procedure: CYSTOSCOPY WITH RETROGRADE PYELOGRAM, URETEROSCOPY AND REMOVAL STENT WITH  STENT PLACEMENT;  Surgeon: Alexis Frock, MD;  Location: [facility];  Service: Urology;  Laterality: Right;  . Holmium laser application Right 8/93/7342    Procedure: HOLMIUM LASER APPLICATION;  Surgeon: Alexis Frock, MD;  Location: [facility];  Service: Urology;  Laterality: Right;  . Esophagogastroduodenoscopy (egd) with propofol N/A 11/16/2014    Procedure: ESOPHAGOGASTRODUODENOSCOPY (EGD) WITH PROPOFOL;  Surgeon: Milus Banister, MD;  Location: [facility];  Service: Endoscopy;  Laterality: N/A;    SOCIAL HISTORY: Social History   Social History  . Marital Status: Single    Spouse Name: N/A  . Number of Children: 2  . Years of Education: N/A   Occupational History  . She used to work at a [employer]    Social History Main Topics  . Smoking status: Never Smoker   . Smokeless tobacco: Not on file  . Alcohol Use: No  . Drug Use: No  . Sexual Activity: Not Currently    Birth Control/ Protection: Abstinence   Other Topics Concern  . Not on file   Social History Narrative    FAMILY  HISTORY: Family History  Problem Relation Age of Onset  . Stomach cancer Mother   . Lung disease Father   . Asthma Father     ALLERGIES:  has No Known Allergies.  MEDICATIONS:  Current Outpatient Prescriptions  Medication Sig Dispense Refill  . acyclovir (ZOVIRAX) 400 MG tablet Take 1 tablet (400 mg total) by mouth 2 (two) times daily. 60 tablet 3  . baclofen (LIORESAL) 10 MG tablet Take at bedtime to help with muscle cramps    . dexamethasone (DECADRON) 4 MG tablet Take 10 tablets (40 mg total) by mouth once a week. On the day of chemo treatment 40 tablet 2  . lidocaine-prilocaine (EMLA) cream Apply 1 application topically as needed. (Patient taking differently: Apply 1 application topically as needed (port). ) 30 g 6  . omeprazole (PRILOSEC) 20 MG capsule Take 1 capsule (20 mg total) by mouth daily. 30 capsule 2  . polyethylene glycol (MIRALAX / GLYCOLAX) packet Take 17 g by mouth as needed for moderate constipation. 28 each 1  . entecavir (BARACLUDE) 0.5 MG tablet Take 0.5 mg by mouth daily.      No current facility-administered medications for this visit.    REVIEW OF SYSTEMS:   Constitutional: Denies  fevers, chills or abnormal night sweats Eyes: Denies blurriness of vision, double vision or watery eyes Ears, nose, mouth, throat, and face: Denies mucositis or sore throat Respiratory: Denies cough, dyspnea or wheezes Cardiovascular: Denies palpitation, chest discomfort or lower extremity swelling Gastrointestinal:  Denies nausea, heartburn or change in bowel habits Skin: Denies abnormal skin rashes Lymphatics: Denies new lymphadenopathy or easy bruising Neurological:Denies numbness, tingling or new weaknesses Behavioral/Psych: Mood is stable, no new changes  All other systems were reviewed with the patient and are negative.  PHYSICAL EXAMINATION: ECOG PERFORMANCE STATUS: 1  Filed Vitals:   01/12/15 1107  BP: 113/73  Pulse: 82  Temp: 97.4 F (36.3 C)  Resp: 18    Filed Weights   01/12/15 1107  Weight: 136 lb (61.689 kg)    GENERAL:alert, no distress and comfortable SKIN: skin color, texture, turgor are normal, no rashes or significant lesions EYES: normal, conjunctiva are pink and non-injected, sclera clear OROPHARYNX:no exudate, no erythema and lips, buccal mucosa, and tongue normal  NECK: supple, thyroid normal size, non-tender, without nodularity LYMPH:  no palpable lymphadenopathy in the cervical, axillary or inguinal LUNGS: clear to auscultation and percussion with normal breathing effort HEART: regular rate & rhythm and no murmurs and no lower extremity edema ABDOMEN:abdomen soft, non-tender and normal bowel sounds Musculoskeletal:no cyanosis of digits and no clubbing  PSYCH: alert & oriented x 3 with fluent speech NEURO: no focal motor/sensory deficits  LABORATORY DATA:  I have reviewed the data as listed CBC Latest Ref Rng 01/12/2015 01/05/2015 12/29/2014  WBC 3.9 - 10.3 10e3/uL 3.5(L) 3.5(L) 4.2  Hemoglobin 11.6 - 15.9 g/dL 11.3(L) 11.5(L) 11.8  Hematocrit 34.8 - 46.6 % 34.6(L) 35.8 36.7  Platelets 145 - 400 10e3/uL 199 204 245    CMP Latest Ref Rng 01/12/2015 01/05/2015 12/29/2014  Glucose 70 - 140 mg/dl 191(H) 82 109  BUN 7.0 - 26.0 mg/dL 9.3 7.6 11.8  Creatinine 0.6 - 1.1 mg/dL 0.7 0.7 0.6  Sodium 136 - 145 mEq/L 140 142 141  Potassium 3.5 - 5.1 mEq/L 3.6 3.8 3.9  Chloride 101 - 111 mmol/L - - -  CO2 22 - 29 mEq/L _0 Calcium 8.4 - 10.4 mg/dL 9.0 9.3 9.3  Total Protein 6.4 - 8.3 g/dL 6.5 6.3(L) 6.4  Total Bilirubin 0.20 - 1.20 mg/dL <0.30 <0.30 0.37  Alkaline Phos 40 - 150 U/L 186(H) 191(H) 197(H)  AST 5 - 34 U/L 41(H) 42(H) 49(H)  ALT 0 - 55 U/L _1 SPEP M-protein  09/15/2014: 4.2 10/08/14: 4.6 12/08/2014: 0.2  IgG mg/dl 11/03/2014: 3150  12/08/2014:  759  Kappa/lambda light chains levels and ration  11/03/14: 0.75, 152, 0.00 12/22/2014: 1.10, 2.60, 0.42  24 h urine UPEP/IFE and light  chain: 11/03/2014: IFE showed a monoclonal IgG heavy chain with associated lambda light chain. M protein was Undetectable    PATHOLOGY REPORT  Bone Marrow (BM) and Peripheral Blood (PB) 10/10/2014 [facility])  FINAL PATHOLOGIC DIAGNOSIS      BONE MARROW AND PERIPHERAL BLOOD:  Extensive involvement by plasma cell myeloma/leukemia.  See comment and CBC data.      COMMENT:  The bone marrow aspirate smears are spicular, cellular and  adequate for evaluation. Atypical plasma cells comprise 57% of  the cellularity (500 cell manual differential count). The plasma  cells have enlarged nuclei with scattered binucleated forms and  irregular nuclear contour. There is diminished multilineage  hematopoiesis with adequate maturation. Blasts are not increased  in  numbers (less than 1% by manual differential counts). 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 There is no overt dysplasia of the  myeloid or erythroid lineages. Megakaryocytes are quantitatively  and qualitatively unremarkable. There is no lymphocytosis.    Examination of the bone marrow core biopsy reveals a markedly  hypercellular marrow for age (95%) with near complete replacement  by sheets of plasma cells. Rare myeloid and erythroid elements  are present. Megakaryocytes are sparse and focally clustered with  hypolobated and hyperchromatic nuclei. There are no atypical  lymphoid aggregates or granulomas identified. Examination of the  bone marrow clot section reveals similar findings.    The peripheral blood demonstrates a leukocytosis comprised of  large plasma cells and normocytic anemia. Rouleaux is present.  There is a myeloid left shift with rare circulating blasts.    By flow cytometry on the peripheral blood, the abnormal plasma  cell population comprises 64% of the cellular elements and  expresses cytoplasmic CD138,  CD45 and cLambda with dim expression  of CD19. The plasma cells are negative for surface CD2, CD3, CD4,  CD5, CD7, CD8, CD56, CD10, CD20, sKappa, sLambda, HLA-DR and  cKappa.    Overall, the findings demonstrate extensive involvement by plasma  cell myeloma/leukemia.     RADIOGRAPHIC STUDIES: I have personally reviewed the radiological images as listed and agreed with the findings in the report. No results found.  ASSESSMENT & PLAN: 44 year old [place], presented with anemia and leokocytosis   1. Acute plasma cell leukemia -She has started [facility]borD at [facility] in early Sep 2016 -She is on C4D15 CyBorD (weekly every 3 weeks) now, tolerating well. -She is being followed by Dr. Tiana Loft at [facility] for treatment, and auto stem cell transplant is planned for mid-Jan. -continue acyclovir when she is on Velcade -She has had good response to induction chemotherapy. Her white blood count has come down to normal, no leukemia cells on the peripheral blood. Repeated SPEP showed M protein has dropped to 0.2 from pretreatment 4.6 -Lab reviewed, adequate for treatment, she'll proceed treatment today   2. Anorexia, constipation, abdominal pain -Likely secondary to dexa and chemotherapy, her symptoms has much improved lately, abdominal pain resolved  -I encouraged her to use nutrition supplement, such as Ensure or boost -Follow up with the dietitian -EGD was negative, on PPI due to steroids   3. Chronic Hepatitis B  -She was started on entecavir at [facility], but only received one months, she does have one refill, but has not been able to refill it, she states her pharmacy has ordered for her but  she has not heard back yet. I encouraged her to call the pharmacy again today, or give [place] the pharmacy number and we can follow  -She was scheduled to see ID at [facility] but she missed her appointment  -alternatively, I can refer her to local ID if she can not follow up at [facility]. I will check with  Dr. Debbrah Alar  4. Normocytic anemia -Likely secondary to her acute plasma cell leukemia and chemo -stable, has not need blood transfusion  -We'll monitor closely.  5. Communication and Social support -She does not speak English, she is accompanied by interpreter to our cancer center -She has significant communication barrier, limited social support, and poor health literacy  -We discuss her financial concern about her BMT today, she has spoken with the Education officer, museum at [facility] regarding her financial concern about transplant.   Plan -  [id number] CyBorD today, and continue weekly until mid Jan  -RTC for follow up in 2 weeks with APP and I will see her in 4 weeks   All questions were answered. The patient knows to call the clinic with any problems, questions or concerns. I spent 20 minutes counseling the patient face to face. The total time spent in the appointment was 30 minutes and more than 50% was on counseling.     Truitt Merle, MD 01/12/2015 10:54 PM

## 2015-01-15 NOTE — Telephone Encounter (Signed)
      Call Documentation      Thu Simon Rhein, RN at 01/11/2015 1:40 PM     Status: Signed       Expand All Collapse All   Spoke with Wells Guiles, RN @ [facility] and informed her re: Dr. Burr Medico had communicated with Dr. Norma Fredrickson yesterday about pt's status. Faxed last office notes, lab results, scan, and chemo flow sheets to [facility] for Dr. Norma Fredrickson to review. [facility]'s Phone [phone] ; Fax [phone].

## 2015-01-16 ENCOUNTER — Telehealth: Payer: Self-pay | Admitting: *Deleted

## 2015-01-16 NOTE — Telephone Encounter (Signed)
Received call from [facility] stating that pt was seen there & Dr Norma Fredrickson reccommended transplant after cycle # 4 but pt's insurance won't allow to be done at [facility].  The sister states that pt's insurance is changing 1st of the year & may allow to be done at [facility].  She will investigate.  Insurance will allow at [facility] presently.  Message passed to Dr Burr Medico. Pt's daughter speaks english & this nurse will call her & ask her to investigate with new policy.

## 2015-01-18 ENCOUNTER — Ambulatory Visit: Payer: 59

## 2015-01-18 ENCOUNTER — Ambulatory Visit (HOSPITAL_BASED_OUTPATIENT_CLINIC_OR_DEPARTMENT_OTHER): Payer: 59

## 2015-01-18 ENCOUNTER — Telehealth: Payer: Self-pay | Admitting: Hematology

## 2015-01-18 ENCOUNTER — Other Ambulatory Visit (HOSPITAL_BASED_OUTPATIENT_CLINIC_OR_DEPARTMENT_OTHER): Payer: 59

## 2015-01-18 VITALS — BP 100/43 | HR 85 | Temp 98.0°F | Resp 18

## 2015-01-18 DIAGNOSIS — C9010 Plasma cell leukemia not having achieved remission: Secondary | ICD-10-CM | POA: Diagnosis not present

## 2015-01-18 DIAGNOSIS — Z5112 Encounter for antineoplastic immunotherapy: Secondary | ICD-10-CM | POA: Diagnosis not present

## 2015-01-18 DIAGNOSIS — Z95828 Presence of other vascular implants and grafts: Secondary | ICD-10-CM

## 2015-01-18 LAB — COMPREHENSIVE METABOLIC PANEL WITH GFR
ALT: 22 U/L (ref 0–55)
AST: 45 U/L — ABNORMAL HIGH (ref 5–34)
Albumin: 3.5 g/dL (ref 3.5–5.0)
Alkaline Phosphatase: 171 U/L — ABNORMAL HIGH (ref 40–150)
Anion Gap: 7 meq/L (ref 3–11)
BUN: 10.1 mg/dL (ref 7.0–26.0)
CO2: 26 meq/L (ref 22–29)
Calcium: 9.2 mg/dL (ref 8.4–10.4)
Chloride: 107 meq/L (ref 98–109)
Creatinine: 0.7 mg/dL (ref 0.6–1.1)
EGFR: >90 ml/min/1.73 m2
Glucose: 88 mg/dL (ref 70–140)
Potassium: 4.1 meq/L (ref 3.5–5.1)
Sodium: 140 meq/L (ref 136–145)
Total Bilirubin: <0.3 mg/dL (ref 0.20–1.20)
Total Protein: 6.5 g/dL (ref 6.4–8.3)

## 2015-01-18 LAB — CBC WITH DIFFERENTIAL/PLATELET
BASO%: 0.4 % (ref 0.0–2.0)
Basophils Absolute: 0 10e3/uL (ref 0.0–0.1)
EOS%: 0.8 % (ref 0.0–7.0)
Eosinophils Absolute: 0 10e3/uL (ref 0.0–0.5)
HCT: 35.6 % (ref 34.8–46.6)
HGB: 11.4 g/dL — ABNORMAL LOW (ref 11.6–15.9)
LYMPH%: 7.4 % — ABNORMAL LOW (ref 14.0–49.7)
MCH: 28 pg (ref 25.1–34.0)
MCHC: 32 g/dL (ref 31.5–36.0)
MCV: 87.4 fL (ref 79.5–101.0)
MONO#: 0.2 10e3/uL (ref 0.1–0.9)
MONO%: 3.7 % (ref 0.0–14.0)
NEUT#: 4.5 10e3/uL (ref 1.5–6.5)
NEUT%: 87.7 % — ABNORMAL HIGH (ref 38.4–76.8)
Platelets: 213 10e3/uL (ref 145–400)
RBC: 4.08 10e6/uL (ref 3.70–5.45)
RDW: 15.7 % — ABNORMAL HIGH (ref 11.2–14.5)
WBC: 5.1 10e3/uL (ref 3.9–10.3)
lymph#: 0.4 10e3/uL — ABNORMAL LOW (ref 0.9–3.3)

## 2015-01-18 MED ORDER — BORTEZOMIB CHEMO SQ INJECTION 3.5 MG (2.5MG/ML)
1.5000 mg/m2 | Freq: Once | INTRAMUSCULAR | Status: AC
  Administered 2015-01-18: 2.5 mg via SUBCUTANEOUS
  Filled 2015-01-18: qty 2.5

## 2015-01-18 MED ORDER — HEPARIN SOD (PORK) LOCK FLUSH 100 UNIT/ML IV SOLN
500.0000 [IU] | Freq: Once | INTRAVENOUS | Status: AC
  Administered 2015-01-18: 500 [IU]
  Filled 2015-01-18: qty 5

## 2015-01-18 MED ORDER — SODIUM CHLORIDE 0.9 % IV SOLN
300.0000 mg/m2 | Freq: Once | INTRAVENOUS | Status: AC
  Administered 2015-01-18: 480 mg via INTRAVENOUS
  Filled 2015-01-18: qty 24

## 2015-01-18 MED ORDER — SODIUM CHLORIDE 0.9 % IJ SOLN
10.0000 mL | INTRAMUSCULAR | Status: DC | PRN
  Administered 2015-01-18: 10 mL via INTRAVENOUS
  Filled 2015-01-18: qty 10

## 2015-01-18 MED ORDER — SODIUM CHLORIDE 0.9 % IJ SOLN
10.0000 mL | Freq: Once | INTRAMUSCULAR | Status: AC
  Administered 2015-01-18: 10 mL
  Filled 2015-01-18: qty 10

## 2015-01-18 MED ORDER — ONDANSETRON HCL 8 MG PO TABS
ORAL_TABLET | ORAL | Status: AC
  Filled 2015-01-18: qty 1

## 2015-01-18 MED ORDER — SODIUM CHLORIDE 0.9 % IV SOLN
Freq: Once | INTRAVENOUS | Status: AC
  Administered 2015-01-18: 11:00:00 via INTRAVENOUS

## 2015-01-18 MED ORDER — ONDANSETRON HCL 8 MG PO TABS
8.0000 mg | ORAL_TABLET | Freq: Once | ORAL | Status: AC
  Administered 2015-01-18: 8 mg via ORAL

## 2015-01-18 NOTE — Patient Instructions (Signed)
[facility] Discharge Instructions for Patients Receiving Chemotherapy  Today you received the following chemotherapy agents :  Velcade,  Cytoxan.  To help prevent nausea and vomiting after your treatment, we encourage you to take your nausea medication as prescribed.   If you develop nausea and vomiting that is not controlled by your nausea medication, call the clinic.   BELOW ARE SYMPTOMS THAT SHOULD BE REPORTED IMMEDIATELY:  *FEVER GREATER THAN 100.5 F  *CHILLS WITH OR WITHOUT FEVER  NAUSEA AND VOMITING THAT IS NOT CONTROLLED WITH YOUR NAUSEA MEDICATION  *UNUSUAL SHORTNESS OF BREATH  *UNUSUAL BRUISING OR BLEEDING  TENDERNESS IN MOUTH AND THROAT WITH OR WITHOUT PRESENCE OF ULCERS  *URINARY PROBLEMS  *BOWEL PROBLEMS  UNUSUAL RASH Items with * indicate a potential emergency and should be followed up as soon as possible.  Feel free to call the clinic you have any questions or concerns. The clinic phone number is [phone].  Please show the CHEMO ALERT CARD at check-in to the Emergency Department and triage nurse.   

## 2015-01-18 NOTE — Telephone Encounter (Signed)
per pof to sch pt appt @ Infectious Disease-[place]-they will call to sch appt w/pt

## 2015-01-18 NOTE — Progress Notes (Signed)
Dr. Burr Medico reviewed lab results today.  OK to treat per md. Instructed pt that Dr. Burr Medico has referred pt to ID here in [place].  Reinforced that pt needs to keep appt when it is given to pt.  Instructed pt also to have her daughter - who speaks English well - to call [facility] to check on acceptance of BMT with pt's new insurance.  Instructed pt to call collaborative nurse to let us know of acceptance for BMT from [place] voiced understanding through interpreter today.

## 2015-01-18 NOTE — Addendum Note (Signed)
Addended by: Truitt Merle on: 01/18/2015 10:38 AM   Modules accepted: Orders

## 2015-01-25 ENCOUNTER — Encounter: Payer: Self-pay | Admitting: Hematology

## 2015-01-25 ENCOUNTER — Ambulatory Visit: Payer: 59

## 2015-01-25 ENCOUNTER — Ambulatory Visit (HOSPITAL_BASED_OUTPATIENT_CLINIC_OR_DEPARTMENT_OTHER): Payer: 59

## 2015-01-25 ENCOUNTER — Ambulatory Visit (HOSPITAL_BASED_OUTPATIENT_CLINIC_OR_DEPARTMENT_OTHER): Payer: 59 | Admitting: Hematology

## 2015-01-25 ENCOUNTER — Other Ambulatory Visit (HOSPITAL_BASED_OUTPATIENT_CLINIC_OR_DEPARTMENT_OTHER): Payer: 59

## 2015-01-25 VITALS — BP 108/68 | HR 115 | Temp 98.6°F | Resp 17 | Ht 63.0 in | Wt 133.2 lb

## 2015-01-25 DIAGNOSIS — Z5111 Encounter for antineoplastic chemotherapy: Secondary | ICD-10-CM

## 2015-01-25 DIAGNOSIS — Z95828 Presence of other vascular implants and grafts: Secondary | ICD-10-CM

## 2015-01-25 DIAGNOSIS — C9010 Plasma cell leukemia not having achieved remission: Secondary | ICD-10-CM

## 2015-01-25 DIAGNOSIS — Z5112 Encounter for antineoplastic immunotherapy: Secondary | ICD-10-CM

## 2015-01-25 DIAGNOSIS — R05 Cough: Secondary | ICD-10-CM | POA: Diagnosis not present

## 2015-01-25 DIAGNOSIS — R630 Anorexia: Secondary | ICD-10-CM

## 2015-01-25 DIAGNOSIS — K5900 Constipation, unspecified: Secondary | ICD-10-CM

## 2015-01-25 DIAGNOSIS — R109 Unspecified abdominal pain: Secondary | ICD-10-CM

## 2015-01-25 DIAGNOSIS — B1910 Unspecified viral hepatitis B without hepatic coma: Secondary | ICD-10-CM

## 2015-01-25 LAB — CBC WITH DIFFERENTIAL/PLATELET
BASO%: 0.5 % (ref 0.0–2.0)
BASOS ABS: 0 10*3/uL (ref 0.0–0.1)
EOS%: 1.2 % (ref 0.0–7.0)
Eosinophils Absolute: 0.1 10*3/uL (ref 0.0–0.5)
HEMATOCRIT: 38 % (ref 34.8–46.6)
HGB: 12.4 g/dL (ref 11.6–15.9)
LYMPH#: 0.4 10*3/uL — AB (ref 0.9–3.3)
LYMPH%: 7.2 % — AB (ref 14.0–49.7)
MCH: 28.5 pg (ref 25.1–34.0)
MCHC: 32.6 g/dL (ref 31.5–36.0)
MCV: 87.3 fL (ref 79.5–101.0)
MONO#: 0.6 10*3/uL (ref 0.1–0.9)
MONO%: 10 % (ref 0.0–14.0)
NEUT#: 4.7 10*3/uL (ref 1.5–6.5)
NEUT%: 81.1 % — AB (ref 38.4–76.8)
PLATELETS: 187 10*3/uL (ref 145–400)
RBC: 4.35 10*6/uL (ref 3.70–5.45)
RDW: 16 % — ABNORMAL HIGH (ref 11.2–14.5)
WBC: 5.7 10*3/uL (ref 3.9–10.3)

## 2015-01-25 LAB — COMPREHENSIVE METABOLIC PANEL
ALBUMIN: 3.7 g/dL (ref 3.5–5.0)
ALK PHOS: 158 U/L — AB (ref 40–150)
ALT: 13 U/L (ref 0–55)
ANION GAP: 10 meq/L (ref 3–11)
AST: 36 U/L — ABNORMAL HIGH (ref 5–34)
BUN: 9.5 mg/dL (ref 7.0–26.0)
CALCIUM: 9.5 mg/dL (ref 8.4–10.4)
CHLORIDE: 102 meq/L (ref 98–109)
CO2: 25 mEq/L (ref 22–29)
CREATININE: 0.8 mg/dL (ref 0.6–1.1)
EGFR: >90 mL/min/{1.73_m2} (ref >90)
Glucose: 99 mg/dl (ref 70–140)
POTASSIUM: 4 meq/L (ref 3.5–5.1)
Sodium: 137 mEq/L (ref 136–145)
Total Bilirubin: 0.41 mg/dL (ref 0.20–1.20)
Total Protein: 7.4 g/dL (ref 6.4–8.3)

## 2015-01-25 MED ORDER — SODIUM CHLORIDE 0.9 % IJ SOLN
10.0000 mL | INTRAMUSCULAR | Status: DC | PRN
  Administered 2015-01-25 (×2): 10 mL via INTRAVENOUS
  Filled 2015-01-25: qty 10

## 2015-01-25 MED ORDER — ONDANSETRON HCL 8 MG PO TABS
8.0000 mg | ORAL_TABLET | Freq: Once | ORAL | Status: AC
  Administered 2015-01-25: 8 mg via ORAL

## 2015-01-25 MED ORDER — HEPARIN SOD (PORK) LOCK FLUSH 100 UNIT/ML IV SOLN
500.0000 [IU] | Freq: Once | INTRAVENOUS | Status: DC
  Administered 2015-01-25: 500 [IU] via INTRAVENOUS
  Filled 2015-01-25: qty 5

## 2015-01-25 MED ORDER — ONDANSETRON HCL 8 MG PO TABS
ORAL_TABLET | ORAL | Status: AC
  Filled 2015-01-25: qty 1

## 2015-01-25 MED ORDER — AZITHROMYCIN 500 MG PO TABS: 500.0000 mg | ORAL_TABLET | Freq: Every day | ORAL | Status: DC

## 2015-01-25 MED ORDER — SODIUM CHLORIDE 0.9 % IV SOLN
300.0000 mg/m2 | Freq: Once | INTRAVENOUS | Status: AC
  Administered 2015-01-25: 480 mg via INTRAVENOUS
  Filled 2015-01-25: qty 24

## 2015-01-25 MED ORDER — BORTEZOMIB CHEMO SQ INJECTION 3.5 MG (2.5MG/ML)
1.5000 mg/m2 | Freq: Once | INTRAMUSCULAR | Status: AC
  Administered 2015-01-25: 2.5 mg via SUBCUTANEOUS
  Filled 2015-01-25: qty 2.5

## 2015-01-25 MED ORDER — SODIUM CHLORIDE 0.9 % IV SOLN
Freq: Once | INTRAVENOUS | Status: AC
  Administered 2015-01-25: 11:00:00 via INTRAVENOUS

## 2015-01-25 NOTE — Patient Instructions (Signed)
[place] Discharge Instructions for Patients Receiving Chemotherapy  Today you received the following chemotherapy agents Cytoxan/Velcade.   To help prevent nausea and vomiting after your treatment, we encourage you to take your nausea medication as directed.    If you develop nausea and vomiting that is not controlled by your nausea medication, call the clinic.   BELOW ARE SYMPTOMS THAT SHOULD BE REPORTED IMMEDIATELY:  *FEVER GREATER THAN 100.5 F  *CHILLS WITH OR WITHOUT FEVER  NAUSEA AND VOMITING THAT IS NOT CONTROLLED WITH YOUR NAUSEA MEDICATION  *UNUSUAL SHORTNESS OF BREATH  *UNUSUAL BRUISING OR BLEEDING  TENDERNESS IN MOUTH AND THROAT WITH OR WITHOUT PRESENCE OF ULCERS  *URINARY PROBLEMS  *BOWEL PROBLEMS  UNUSUAL RASH Items with * indicate a potential emergency and should be followed up as soon as possible.  Feel free to call the clinic you have any questions or concerns. The clinic phone number is [phone].  Please show the [place] at check-in to the Emergency Department and triage nurse.

## 2015-01-25 NOTE — Progress Notes (Signed)
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   10/10/2014 Miscellaneous Peripheral blood chemistry and leukocytosis with total white count 78K, comprised of large plasma cells and his normocytic anemia. There is a myeloid left shift with previous surgical radium blasts. Flow cytometry showed 64% plasma cells   10/10/2014 Bone Marrow Biopsy Markedly hypercellular marrow (95%), Atypical plasma cells comprise 57% of the cellularity. There was diminished multilineage in hematopoiesis with adequate maturation. Breasts less than 1%), no overt dysplasia of the myeloid or erythroid lineages.    10/10/2014 Initial Diagnosis Plasma cell leukemia   10/13/2014 -  Chemotherapy CyborD (cytoxan 346m/m2 iv, bortezomib 1.5 mg/m, dexamethasone 40 mg, weekly every 28 days, bortezomib and dexamethasone was given twice weekly for 2 weeks during the first cycle)    HISTORY OF PRESENTING ILLNESS:  Sheryl [name] 44y.o. female is here because of recently diagnosed plasma cell leukemia. [facility] and is here to establish her local oncological care with [place] she is accompanied to the clinic by her daughter and interpreter.  She presented to our hospital lth with worsening dyspnea, fatigue, cough, subjective fevers and chills, and was admitted on  09/14/2014. She was found to have allergy WBC 54.1K, hemoglobin 8.1, plt 310, she was treated with symptom management, and was discharged home on 09/17/2014, with a plan to follow-up with hematology. She represents to emergency room on 10/07/2014, was found to have white count of 78K, and worsening anemia with hemoglobin 6. She was seen by my partner Dr. EJonette Evaand plasma cell leukemia was suspected. [facility] Bone marrow biopsy was done, which confirmed plasma cell leukemia, and she started chemotherapy with Velcade, Cytoxan and dexamethasone. She received weekly dose twice, last dose on 10/20/2014. She was discharged to home afterwards. She had a mediport placed during her hospitalization.  She has moderate fatigue, low appetie, she lost about 13 lbs in the past few months. She has moderate abdominal pain, 5-6/10, persistent, she does not take any pain meds.   I reviewed her medical records extensively, and discussed her case with Dr. EJorje Guildand BRamonaprogram coordinator.  CURRENT THERAPY: Cytoxan 300 mg/m, IV, bortezomib 1.5 mg/m subcutaneous, and dexamethasone 40 milligrams, weekly, every 4 weeks, [facility] received bortezomib twice a week for the first 4 weeks  INTERIM HISTORY: Bertine returns for follow up and weekly chemo. She is accompanied by a [place] [facility] and unsure if her insurance will cover stem cell transplant. [facility] She reports having three day history of nonproductive cough, chest tightness, fatigue, and decreased appetite. She denies fever, chills, or dyspnea. She has mild lower abdominal pain but denies  nausea, vomiting, or change in bowel movement or urination. She has not had menstrual cycle for 2 months. She is not  sexually active.        MEDICAL HISTORY:  Past Medical History  Diagnosis Date  . GERD (gastroesophageal reflux disease)   . Hepatitis   . History of positive PPD     DX 2011--  CXR DONE NO EVIDENCE  . Hydronephrosis, right   . Right ureteral stone   . History of ureter stent   . Chills with fever     intermittently since d/c from hospital  . Dysuria-frequency syndrome     w/ pink urine  . Urosepsis 8/14    admitted to [facility]  . Pneumonia   . Neuromuscular disorder (HCC)     legs numb intermittently    SURGICAL HISTORY: Past Surgical History  Procedure Laterality Date  . Cystoscopy w/ ureteral stent placement Right 09/25/2012    Procedure: CYSTOSCOPY WITH RETROGRADE PYELOGRAM/URETERAL STENT PLACEMENT;  Surgeon: Alexis Frock, MD;  Location: [facility];  Service: Urology;  Laterality: Right;  . Liver biopsy    . Right vats w/ drainage peural effusion and bx's  10-30-2008  . Removal of uterine cyst      years ago  . Other surgical history Right     removal of ovarian cyst  . Cystoscopy with retrograde pyelogram, ureteroscopy and stent placement Right 10/15/2012    Procedure: CYSTOSCOPY WITH RETROGRADE PYELOGRAM, URETEROSCOPY AND REMOVAL STENT WITH  STENT PLACEMENT;  Surgeon: Alexis Frock, MD;  [facility];  Service: Urology;  Laterality: Right;  . Holmium laser application Right 03/24/7586    Procedure: HOLMIUM LASER APPLICATION;  Surgeon: Alexis Frock, MD;  Location: [facility];  Service: Urology;  Laterality: Right;  . Esophagogastroduodenoscopy (egd) with propofol N/A 11/16/2014    Procedure: ESOPHAGOGASTRODUODENOSCOPY (EGD) WITH PROPOFOL;  Surgeon: Milus Banister, MD;  Location: [facility];  Service: Endoscopy;  Laterality: N/A;    SOCIAL HISTORY: Social History   Social History  . Marital Status: Single    Spouse Name: N/A  . Number of Children: 2  . Years of Education: N/A   Occupational History  . She used to work at a  Company secretary    Social History Main Topics  . Smoking status: Never Smoker   . Smokeless tobacco: Not on file  . Alcohol Use: No  . Drug Use: No  . Sexual Activity: Not Currently    Birth Control/ Protection: Abstinence   Other Topics Concern  . Not on file   Social History Narrative    FAMILY HISTORY: Family History  Problem Relation Age of Onset  . Stomach cancer Mother   . Lung disease Father   . Asthma Father     ALLERGIES:  has No Known Allergies.  MEDICATIONS:  Current Outpatient Prescriptions  Medication Sig Dispense Refill  . acyclovir (ZOVIRAX) 400 MG tablet Take 1 tablet (400 mg total) by mouth 2 (two) times daily. 60 tablet 3  . baclofen (LIORESAL) 10 MG tablet Take at bedtime to help with muscle cramps    . dexamethasone (DECADRON) 4 MG tablet Take 10 tablets (40 mg total) by mouth once a week. On the day of chemo treatment 40 tablet 2  . entecavir (BARACLUDE) 0.5 MG tablet Take 0.5 mg by mouth daily.     Marland Kitchen lidocaine-prilocaine (EMLA) cream Apply 1 application topically as needed. (Patient taking differently: Apply 1 application topically as needed (port). ) 30 g  6  . omeprazole (PRILOSEC) 20 MG capsule Take 1 capsule (20 mg total) by mouth daily. 30 capsule 2  . polyethylene glycol (MIRALAX / GLYCOLAX) packet Take 17 g by mouth as needed for moderate constipation. 28 each 1  . azithromycin (ZITHROMAX) 500 MG tablet Take 1 tablet (500 mg total) by mouth daily. 5 tablet 0   No current facility-administered medications for this visit.   Facility-Administered Medications Ordered in Other Visits  Medication Dose Route Frequency Provider Last Rate Last Dose  . heparin lock flush 100 unit/mL  500 Units Intravenous Once Truitt Merle, MD      . sodium chloride 0.9 % injection 10 mL  10 mL Intravenous PRN Truitt Merle, MD   10 mL at 01/25/15 1023    REVIEW OF SYSTEMS:   Constitutional: Denies fevers, chills or abnormal night sweats Eyes: Denies blurriness of vision, double  vision or watery eyes Ears, nose, mouth, throat, and face: Denies mucositis or sore throat Respiratory: Denies cough, dyspnea or wheezes Cardiovascular: Denies palpitation, chest discomfort or lower extremity swelling Gastrointestinal:  Denies nausea, heartburn or change in bowel habits Skin: Denies abnormal skin rashes Lymphatics: Denies new lymphadenopathy or easy bruising Neurological:Denies numbness, tingling or new weaknesses Behavioral/Psych: Mood is stable, no new changes  All other systems were reviewed with the patient and are negative.  PHYSICAL EXAMINATION: ECOG PERFORMANCE STATUS: 1  Filed Vitals:   01/25/15 1043  BP: 108/68  Pulse: 115  Temp: 98.6 F (37 C)  Resp: 17   Filed Weights   01/25/15 1043  Weight: 133 lb 3.2 oz (60.419 kg)    GENERAL:alert, no distress and comfortable SKIN: skin color, texture, turgor are normal, no rashes or significant lesions EYES: normal, conjunctiva are pink and non-injected, sclera clear OROPHARYNX:no exudate, no erythema and lips, buccal mucosa, and tongue normal  NECK: supple, thyroid normal size, non-tender, without nodularity LYMPH:  no palpable lymphadenopathy in the cervical, axillary or inguinal LUNGS: clear to auscultation and percussion with normal breathing effort HEART: regular rate & rhythm and no murmurs and no lower extremity edema ABDOMEN:abdomen soft, non-tender and normal bowel sounds Musculoskeletal:no cyanosis of digits and no clubbing  PSYCH: alert & oriented x 3 with fluent speech NEURO: no focal motor/sensory deficits  LABORATORY DATA:  I have reviewed the data as listed CBC Latest Ref Rng 01/25/2015 01/18/2015 01/12/2015  WBC 3.9 - 10.3 10e3/uL 5.7 5.1 3.5(L)  Hemoglobin 11.6 - 15.9 g/dL 12.4 11.4(L) 11.3(L)  Hematocrit 34.8 - 46.6 % 38.0 35.6 34.6(L)  Platelets 145 - 400 10e3/uL 187 213 199    CMP Latest Ref Rng 01/25/2015 01/18/2015 01/12/2015  Glucose 70 - 140 mg/dl 99 88 191(H)  BUN 7.0 - 26.0  mg/dL 9.5 10.1 9.3  Creatinine 0.6 - 1.1 mg/dL 0.8 0.7 0.7  Sodium 136 - 145 mEq/L 137 140 140  Potassium 3.5 - 5.1 mEq/L 4.0 4.1 3.6  Chloride 101 - 111 mmol/L - - -  CO2 22 - 29 mEq/L _0 Calcium 8.4 - 10.4 mg/dL 9.5 9.2 9.0  Total Protein 6.4 - 8.3 g/dL 7.4 6.5 6.5  Total Bilirubin 0.20 - 1.20 mg/dL 0.41 <0.30 <0.30  Alkaline Phos 40 - 150 U/L 158(H) 171(H) 186(H)  AST 5 - 34 U/L 36(H) 45(H) 41(H)  ALT 0 - 55 U/L _1 SPEP M-protein  09/15/2014: 4.2 10/08/14: 4.6 12/08/2014: 0.2  IgG mg/dl 11/03/2014: 3150  12/08/2014:  759  Kappa/lambda light chains levels and  ration  11/03/14: 0.75, 152, 0.00 12/22/2014: 1.10, 2.60, 0.42  24 h urine UPEP/IFE and light chain: 11/03/2014: IFE showed a monoclonal IgG heavy chain with associated lambda light chain. M protein was Undetectable    PATHOLOGY REPORT  Bone Marrow (BM) and Peripheral Blood (PB) 10/10/2014 [facility])  FINAL PATHOLOGIC DIAGNOSIS      BONE MARROW AND PERIPHERAL BLOOD:  Extensive involvement by plasma cell myeloma/leukemia.  See comment and CBC data.      COMMENT:  The bone marrow aspirate smears are spicular, cellular and  adequate for evaluation. Atypical plasma cells comprise 57% of  the cellularity (500 cell manual differential count). The plasma  cells have enlarged nuclei with scattered binucleated forms and  irregular nuclear contour. There is diminished multilineage  hematopoiesis with adequate maturation. Blasts are not increased  in numbers (less than 1% by manual differential counts). 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 There is no overt dysplasia of the  myeloid or erythroid lineages. Megakaryocytes are quantitatively  and qualitatively unremarkable. There is no lymphocytosis.    Examination of the bone marrow core biopsy reveals a markedly  hypercellular marrow for age (95%) with near  complete replacement  by sheets of plasma cells. Rare myeloid and erythroid elements  are present. Megakaryocytes are sparse and focally clustered with  hypolobated and hyperchromatic nuclei. There are no atypical  lymphoid aggregates or granulomas identified. Examination of the  bone marrow clot section reveals similar findings.    The peripheral blood demonstrates a leukocytosis comprised of  large plasma cells and normocytic anemia. Rouleaux is present.  There is a myeloid left shift with rare circulating blasts.    By flow cytometry on the peripheral blood, the abnormal plasma  cell population comprises 64% of the cellular elements and  expresses cytoplasmic CD138, CD45 and cLambda with dim expression  of CD19. The plasma cells are negative for surface CD2, CD3, CD4,  CD5, CD7, CD8, CD56, CD10, CD20, sKappa, sLambda, HLA-DR and  cKappa.    Overall, the findings demonstrate extensive involvement by plasma  cell myeloma/leukemia.     RADIOGRAPHIC STUDIES: I have personally reviewed the radiological images as listed and agreed with the findings in the report. No results found.  ASSESSMENT & PLAN: 44 year old [place], presented with anemia and leokocytosis   1. Acute plasma cell leukemia -She has started cyborD at [facility] in early Sep 2016 -She is on CyBorD (weekly every 3 weeks) now, tolerating well. -She is being followed by Dr. Tiana Loft at [facility] for treatment, and auto stem cell transplant is planned for mid-Jan. -continue acyclovir when she is on Velcade -She has had good response to induction chemotherapy. Her white blood count has come down to normal, no leukemia cells on the peripheral blood. Repeated SPEP showed M protein has dropped to 0.2 from pretreatment 4.6 -Lab reviewed, adequate for treatment, she'll proceed treatment today   2. Cough -Probable bronchitis -I called in azithromycin 500 mg daily for 5 days  3. Anorexia, constipation,  abdominal pain -Likely secondary to dexa and chemotherapy, her symptoms has much improved lately, abdominal pain resolved  -I encouraged her to use nutrition supplement, such as Ensure or boost -Follow up with the dietitian -EGD was negative, on PPI due to steroids   4. Chronic Hepatitis B  -She was started on entecavir at [facility], second months treatment was delayed due to the reveal issue, she did restart recently. -I have referred her  to infection disease in [place], appointment still pending   5. Communication and Social support -She does not speak English, she is accompanied by interpreter to our cancer center -She has significant communication barrier, limited social support, and poor health literacy  -We discuss her financial concern about her BMT today, she has spoken with the Education officer, museum at [facility] regarding her financial concern about transplant.   Plan -C5D8 (every 3 weeks) CyBorD today, and continue weekly until mid Jan  azithromycin 500 mg daily for 5 days -RTC for follow up in 2 weeks  -will check with [facility] on the first week of January to see if they will take her new insurance for the transplant.  All questions were answered. The patient knows to call the clinic with any problems, questions or concerns. I spent 20 minutes counseling the patient face to face. The total time spent in the appointment was 30 minutes and more than 50% was on counseling.     Truitt Merle, MD 01/25/2015 11:23 AM

## 2015-01-25 NOTE — Patient Instructions (Signed)

## 2015-01-26 ENCOUNTER — Telehealth: Payer: Self-pay | Admitting: Hematology

## 2015-01-26 NOTE — Telephone Encounter (Signed)
per pof to add flush-cld & spoke to daughter and gave time & date of appt on 12/29 daughter understtod [place]

## 2015-02-01 ENCOUNTER — Ambulatory Visit (HOSPITAL_BASED_OUTPATIENT_CLINIC_OR_DEPARTMENT_OTHER): Payer: 59

## 2015-02-01 ENCOUNTER — Ambulatory Visit: Payer: 59

## 2015-02-01 ENCOUNTER — Other Ambulatory Visit: Payer: 59

## 2015-02-01 ENCOUNTER — Other Ambulatory Visit (HOSPITAL_BASED_OUTPATIENT_CLINIC_OR_DEPARTMENT_OTHER): Payer: 59

## 2015-02-01 VITALS — BP 107/56 | HR 76 | Temp 97.0°F | Resp 18

## 2015-02-01 DIAGNOSIS — Z95828 Presence of other vascular implants and grafts: Secondary | ICD-10-CM

## 2015-02-01 DIAGNOSIS — Z5112 Encounter for antineoplastic immunotherapy: Secondary | ICD-10-CM | POA: Diagnosis not present

## 2015-02-01 DIAGNOSIS — C9010 Plasma cell leukemia not having achieved remission: Secondary | ICD-10-CM

## 2015-02-01 LAB — COMPREHENSIVE METABOLIC PANEL
ALT: 10 U/L (ref 0–55)
ANION GAP: 7 meq/L (ref 3–11)
AST: 30 U/L (ref 5–34)
Albumin: 3.3 g/dL — ABNORMAL LOW (ref 3.5–5.0)
Alkaline Phosphatase: 120 U/L (ref 40–150)
BUN: 6.8 mg/dL — AB (ref 7.0–26.0)
CALCIUM: 9.1 mg/dL (ref 8.4–10.4)
CO2: 27 meq/L (ref 22–29)
CREATININE: 0.7 mg/dL (ref 0.6–1.1)
Chloride: 109 mEq/L (ref 98–109)
EGFR: >90 mL/min/{1.73_m2} (ref >90)
Glucose: 107 mg/dl (ref 70–140)
Potassium: 3.6 mEq/L (ref 3.5–5.1)
Sodium: 143 mEq/L (ref 136–145)
TOTAL PROTEIN: 6.4 g/dL (ref 6.4–8.3)

## 2015-02-01 LAB — CBC WITH DIFFERENTIAL/PLATELET
BASO%: 0.2 % (ref 0.0–2.0)
Basophils Absolute: 0 10*3/uL (ref 0.0–0.1)
EOS%: 1.5 % (ref 0.0–7.0)
Eosinophils Absolute: 0.1 10*3/uL (ref 0.0–0.5)
HEMATOCRIT: 34.2 % — AB (ref 34.8–46.6)
HGB: 11.1 g/dL — ABNORMAL LOW (ref 11.6–15.9)
LYMPH#: 0.7 10*3/uL — AB (ref 0.9–3.3)
LYMPH%: 16 % (ref 14.0–49.7)
MCH: 28.6 pg (ref 25.1–34.0)
MCHC: 32.5 g/dL (ref 31.5–36.0)
MCV: 88.1 fL (ref 79.5–101.0)
MONO#: 0.4 10*3/uL (ref 0.1–0.9)
MONO%: 8.9 % (ref 0.0–14.0)
NEUT%: 73.4 % (ref 38.4–76.8)
NEUTROS ABS: 3.4 10*3/uL (ref 1.5–6.5)
PLATELETS: 211 10*3/uL (ref 145–400)
RBC: 3.88 10*6/uL (ref 3.70–5.45)
RDW: 14.7 % — ABNORMAL HIGH (ref 11.2–14.5)
WBC: 4.6 10*3/uL (ref 3.9–10.3)

## 2015-02-01 MED ORDER — ONDANSETRON HCL 8 MG PO TABS
8.0000 mg | ORAL_TABLET | Freq: Once | ORAL | Status: AC
  Administered 2015-02-01: 8 mg via ORAL

## 2015-02-01 MED ORDER — SODIUM CHLORIDE 0.9 % IJ SOLN
10.0000 mL | INTRAMUSCULAR | Status: DC | PRN
  Administered 2015-02-01: 10 mL via INTRAVENOUS
  Filled 2015-02-01: qty 10

## 2015-02-01 MED ORDER — CYCLOPHOSPHAMIDE CHEMO INJECTION 1 GM
300.0000 mg/m2 | Freq: Once | INTRAMUSCULAR | Status: AC
  Administered 2015-02-01: 480 mg via INTRAVENOUS
  Filled 2015-02-01: qty 24

## 2015-02-01 MED ORDER — BORTEZOMIB CHEMO SQ INJECTION 3.5 MG (2.5MG/ML)
1.5000 mg/m2 | Freq: Once | INTRAMUSCULAR | Status: AC
  Administered 2015-02-01: 2.5 mg via SUBCUTANEOUS
  Filled 2015-02-01: qty 2.5

## 2015-02-01 MED ORDER — ONDANSETRON HCL 8 MG PO TABS
ORAL_TABLET | ORAL | Status: AC
  Filled 2015-02-01: qty 1

## 2015-02-01 NOTE — Patient Instructions (Signed)
[place] Discharge Instructions for Patients Receiving Chemotherapy  Today you received the following chemotherapy agents Cytoxan/Velcade.   To help prevent nausea and vomiting after your treatment, we encourage you to take your nausea medication as directed.    If you develop nausea and vomiting that is not controlled by your nausea medication, call the clinic.   BELOW ARE SYMPTOMS THAT SHOULD BE REPORTED IMMEDIATELY:  *FEVER GREATER THAN 100.5 F  *CHILLS WITH OR WITHOUT FEVER  NAUSEA AND VOMITING THAT IS NOT CONTROLLED WITH YOUR NAUSEA MEDICATION  *UNUSUAL SHORTNESS OF BREATH  *UNUSUAL BRUISING OR BLEEDING  TENDERNESS IN MOUTH AND THROAT WITH OR WITHOUT PRESENCE OF ULCERS  *URINARY PROBLEMS  *BOWEL PROBLEMS  UNUSUAL RASH Items with * indicate a potential emergency and should be followed up as soon as possible.  Feel free to call the clinic you have any questions or concerns. The clinic phone number is [phone].  Please show the [place] at check-in to the Emergency Department and triage nurse.

## 2015-02-01 NOTE — Patient Instructions (Signed)

## 2015-02-08 ENCOUNTER — Ambulatory Visit (HOSPITAL_BASED_OUTPATIENT_CLINIC_OR_DEPARTMENT_OTHER): Payer: Self-pay

## 2015-02-08 ENCOUNTER — Telehealth: Payer: Self-pay | Admitting: *Deleted

## 2015-02-08 ENCOUNTER — Encounter: Payer: Self-pay | Admitting: Hematology

## 2015-02-08 ENCOUNTER — Other Ambulatory Visit (HOSPITAL_BASED_OUTPATIENT_CLINIC_OR_DEPARTMENT_OTHER): Payer: Self-pay

## 2015-02-08 ENCOUNTER — Telehealth: Payer: Self-pay | Admitting: Hematology

## 2015-02-08 ENCOUNTER — Ambulatory Visit (HOSPITAL_BASED_OUTPATIENT_CLINIC_OR_DEPARTMENT_OTHER): Payer: Self-pay | Admitting: Hematology

## 2015-02-08 VITALS — BP 103/67 | HR 79 | Temp 98.0°F | Resp 18 | Ht 63.0 in | Wt 137.4 lb

## 2015-02-08 DIAGNOSIS — Z5111 Encounter for antineoplastic chemotherapy: Secondary | ICD-10-CM

## 2015-02-08 DIAGNOSIS — Z5112 Encounter for antineoplastic immunotherapy: Secondary | ICD-10-CM

## 2015-02-08 DIAGNOSIS — C9010 Plasma cell leukemia not having achieved remission: Secondary | ICD-10-CM

## 2015-02-08 DIAGNOSIS — K5900 Constipation, unspecified: Secondary | ICD-10-CM

## 2015-02-08 DIAGNOSIS — B1910 Unspecified viral hepatitis B without hepatic coma: Secondary | ICD-10-CM

## 2015-02-08 DIAGNOSIS — R109 Unspecified abdominal pain: Secondary | ICD-10-CM

## 2015-02-08 DIAGNOSIS — R630 Anorexia: Secondary | ICD-10-CM

## 2015-02-08 LAB — CBC WITH DIFFERENTIAL/PLATELET
BASO%: 0.2 % (ref 0.0–2.0)
Basophils Absolute: 0 10*3/uL (ref 0.0–0.1)
EOS ABS: 0 10*3/uL (ref 0.0–0.5)
EOS%: 0.6 % (ref 0.0–7.0)
HCT: 35.3 % (ref 34.8–46.6)
HEMOGLOBIN: 11.4 g/dL — AB (ref 11.6–15.9)
LYMPH%: 5.9 % — AB (ref 14.0–49.7)
MCH: 28.4 pg (ref 25.1–34.0)
MCHC: 32.4 g/dL (ref 31.5–36.0)
MCV: 87.5 fL (ref 79.5–101.0)
MONO#: 0.1 10*3/uL (ref 0.1–0.9)
MONO%: 2.5 % (ref 0.0–14.0)
NEUT%: 90.8 % — ABNORMAL HIGH (ref 38.4–76.8)
NEUTROS ABS: 3.8 10*3/uL (ref 1.5–6.5)
PLATELETS: 238 10*3/uL (ref 145–400)
RBC: 4.03 10*6/uL (ref 3.70–5.45)
RDW: 15.8 % — ABNORMAL HIGH (ref 11.2–14.5)
WBC: 4.2 10*3/uL (ref 3.9–10.3)
lymph#: 0.3 10*3/uL — ABNORMAL LOW (ref 0.9–3.3)

## 2015-02-08 LAB — COMPREHENSIVE METABOLIC PANEL
ALBUMIN: 3.5 g/dL (ref 3.5–5.0)
ALK PHOS: 124 U/L (ref 40–150)
ALT: <9 U/L (ref 0–55)
ANION GAP: 8 meq/L (ref 3–11)
AST: 28 U/L (ref 5–34)
BILIRUBIN TOTAL: 0.3 mg/dL (ref 0.20–1.20)
BUN: 10.4 mg/dL (ref 7.0–26.0)
CO2: 25 mEq/L (ref 22–29)
Calcium: 9.2 mg/dL (ref 8.4–10.4)
Chloride: 108 mEq/L (ref 98–109)
Creatinine: 0.7 mg/dL (ref 0.6–1.1)
Glucose: 126 mg/dl (ref 70–140)
Potassium: 3.8 mEq/L (ref 3.5–5.1)
Sodium: 141 mEq/L (ref 136–145)
TOTAL PROTEIN: 6.9 g/dL (ref 6.4–8.3)

## 2015-02-08 MED ORDER — BORTEZOMIB CHEMO SQ INJECTION 3.5 MG (2.5MG/ML)
1.5000 mg/m2 | Freq: Once | INTRAMUSCULAR | Status: AC
  Administered 2015-02-08: 2.5 mg via SUBCUTANEOUS
  Filled 2015-02-08: qty 2.5

## 2015-02-08 MED ORDER — SODIUM CHLORIDE 0.9 % IV SOLN
300.0000 mg/m2 | Freq: Once | INTRAVENOUS | Status: AC
  Administered 2015-02-08: 480 mg via INTRAVENOUS
  Filled 2015-02-08: qty 24

## 2015-02-08 MED ORDER — HEPARIN SOD (PORK) LOCK FLUSH 100 UNIT/ML IV SOLN
500.0000 [IU] | Freq: Once | INTRAVENOUS | Status: AC
  Administered 2015-02-08: 500 [IU] via INTRAVENOUS
  Filled 2015-02-08: qty 5

## 2015-02-08 MED ORDER — ONDANSETRON HCL 8 MG PO TABS
8.0000 mg | ORAL_TABLET | Freq: Once | ORAL | Status: AC
  Administered 2015-02-08: 8 mg via ORAL

## 2015-02-08 MED ORDER — SODIUM CHLORIDE 0.9 % IJ SOLN
10.0000 mL | INTRAMUSCULAR | Status: DC | PRN
  Administered 2015-02-08: 10 mL via INTRAVENOUS
  Filled 2015-02-08: qty 10

## 2015-02-08 MED ORDER — ONDANSETRON HCL 8 MG PO TABS
ORAL_TABLET | ORAL | Status: AC
  Filled 2015-02-08: qty 1

## 2015-02-08 MED ORDER — SODIUM CHLORIDE 0.9 % IV SOLN
INTRAVENOUS | Status: DC
  Administered 2015-02-08: 12:00:00 via INTRAVENOUS

## 2015-02-08 NOTE — Telephone Encounter (Signed)
Called Urban Gibson, RN @ Dr. Norma Fredrickson at [facility] and left messages on voice mail requesting a call back to either collaborative nurse or to Dr. Burr Medico to follow up on insurance related issues for BMT . [facility]   Phone     [phone].

## 2015-02-08 NOTE — Patient Instructions (Signed)
[facility] Discharge Instructions for Patients Receiving Chemotherapy  Today you received the following chemotherapy agents Velcade/Cytoxan.  To help prevent nausea and vomiting after your treatment, we encourage you to take your nausea medication as directed.    If you develop nausea and vomiting that is not controlled by your nausea medication, call the clinic.   BELOW ARE SYMPTOMS THAT SHOULD BE REPORTED IMMEDIATELY:  *FEVER GREATER THAN 100.5 F  *CHILLS WITH OR WITHOUT FEVER  NAUSEA AND VOMITING THAT IS NOT CONTROLLED WITH YOUR NAUSEA MEDICATION  *UNUSUAL SHORTNESS OF BREATH  *UNUSUAL BRUISING OR BLEEDING  TENDERNESS IN MOUTH AND THROAT WITH OR WITHOUT PRESENCE OF ULCERS  *URINARY PROBLEMS  *BOWEL PROBLEMS  UNUSUAL RASH Items with * indicate a potential emergency and should be followed up as soon as possible.  Feel free to call the clinic you have any questions or concerns. The clinic phone number is [phone].  Please show the CHEMO ALERT CARD at check-in to the Emergency Department and triage nurse.   

## 2015-02-08 NOTE — Telephone Encounter (Signed)
Received message from [place] @ [facility] re:  Sheryl [name] will cover BMT at [place] needed to know when pt's last cycle of chemo will be so pt can move forward with BMT work up. Dr. Burr Medico notified.  Called Wells Guiles back and left message on voice mail that pt's last cycle of chemo will be 02/22/15.

## 2015-02-08 NOTE — Patient Instructions (Signed)

## 2015-02-08 NOTE — Telephone Encounter (Signed)
Appointments complete per 1/5 pof. Per inf mgr ok to add tx for 1/12. Patient to be given avs report/appointments in inf area.

## 2015-02-08 NOTE — Progress Notes (Signed)
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   10/10/2014 Miscellaneous Peripheral blood chemistry and leukocytosis with total white count 78K, comprised of large plasma cells and his normocytic anemia. There is a myeloid left shift with previous surgical radium blasts. Flow cytometry showed 64% plasma cells   10/10/2014 Bone Marrow Biopsy Markedly hypercellular marrow (95%), Atypical plasma cells comprise 57% of the cellularity. There was diminished multilineage in hematopoiesis with adequate maturation. Breasts less than 1%), no overt dysplasia of the myeloid or erythroid lineages.    10/10/2014 Initial Diagnosis Plasma cell leukemia   10/13/2014 -  Chemotherapy CyborD (cytoxan 347m/m2 iv, bortezomib 1.5 mg/m, dexamethasone 40 mg, weekly every 28 days, bortezomib and dexamethasone was given twice weekly for 2 weeks during the first cycle)    HISTORY OF PRESENTING ILLNESS:  Sheryl [name] 45y.o. female is here because of recently diagnosed plasma cell leukemia. [facility] and is here to establish her local oncological care with [place] speak [place] she is accompanied to the clinic by her daughter and interpreter.  She presented to our hospital lth with worsening dyspnea, fatigue, cough, subjective fevers and chills, and was admitted on 09/14/2014. She was found to  have allergy WBC 54.1K, hemoglobin 8.1, plt 310, she was treated with symptom management, and was discharged home on 09/17/2014, with a plan to follow-up with hematology. She represents to emergency room on 10/07/2014, was found to have white count of 78K, and worsening anemia with hemoglobin 6. She was seen by my partner Dr. EJonette Evaand plasma cell leukemia was suspected. [facility] Bone marrow biopsy was done, which confirmed plasma cell leukemia, and she started chemotherapy with Velcade, Cytoxan and dexamethasone. She received weekly dose twice, last dose on 10/20/2014. She was discharged to home afterwards. She had a mediport placed during her hospitalization.  She has moderate fatigue, low appetie, she lost about 13 lbs in the past few months. She has moderate abdominal pain, 5-6/10, persistent, she does not take any pain meds.   I reviewed her medical records extensively, and discussed her case with Dr. EJorje Guildand BBoonprogram coordinator.  CURRENT THERAPY: Cytoxan 300 mg/m, IV, bortezomib 1.5 mg/m subcutaneous, and dexamethasone 40 milligrams, weekly, every 4 weeks, [facility] bortez[facility]ib twice a week for the first 4 weeks  INTERIM HISTORY: Sheryl [name] returns for follow up and weekly chemo. She is accompanied by a [place] She is doing well overall. She still has mild intermittent dry cough, and right upper quadrant abdominal pain, which is mild and no need for pain meds. No other complains. [facility]  [facility]  [facility] to see if they will take her new insurance for bone marrow transplant.    MEDICAL HISTORY:  Past Medical History  Diagnosis  Date  . GERD (gastroesophageal reflux disease)   . Hepatitis   . History of positive PPD     DX 2011--  CXR DONE NO EVIDENCE  . Hydronephrosis,  right   . Right ureteral stone   . History of ureter stent   . Chills with fever     intermittently since d/c from hospital  . Dysuria-frequency syndrome     w/ pink urine  . Urosepsis 8/14    admitted to [facility]  . Pneumonia   . Neuromuscular disorder (H[facility])     legs numb intermittently    SURGICAL HISTORY: Past Surgical History  Procedure Laterality Date  . Cystoscopy w/ ureteral stent placement Right 09/25/2012    Procedure: CYSTOSCOPY WITH RETROGRADE PYELOGRAM/URETERAL STENT PLACEMENT;  Surgeon: Alexis Frock, MD;  Location: [facility];  Service: Urology;  Laterality: Right;  . Liver biopsy    . Right vats w/ drainage peural effusion and bx's  10-30-2008  . Removal of uterine cyst      years ago  . Other surgical history Right     removal of ovarian cyst  . Cystoscopy with retrograde pyelogram, ureteroscopy and stent placement Right 10/15/2012    Procedure: CYSTOSCOPY WITH RETROGRADE PYELOGRAM, URETEROSCOPY AND REMOVAL STENT WITH  STENT PLACEMENT;  Surgeon: Alexis Frock, MD;  Location: [facility];  Service: Urology;  Laterality: Right;  . Holmium laser application Right 5/85/9292    Procedure: HOLMIUM LASER APPLICATION;  Surgeon: Alexis Frock, MD;  Location: [facility];  Service: Urology;  Laterality: Right;  . Esophagogastroduodenoscopy (egd) with propofol N/A 11/16/2014    Procedure: ESOPHAGOGASTRODUODENOSCOPY (EGD) WITH PROPOFOL;  Surgeon: Milus Banister, MD;  Location: [facility];  Service: Endoscopy;  Laterality: N/A;    SOCIAL HISTORY: Social History   Social History  . Marital Status: Single    Spouse Name: N/A  . Number of Children: 2  . Years of Education: N/A   Occupational History  . She used to work at a [employer]    Social History Main Topics  . Smoking status: Never Smoker   . Smokeless tobacco: Not on file  . Alcohol Use: No  . Drug Use: No  . Sexual Activity: Not Currently    Birth Control/ Protection: Abstinence    Other Topics Concern  . Not on file   Social History Narrative    FAMILY HISTORY: Family History  Problem Relation Age of Onset  . Stomach cancer Mother   . Lung disease Father   . Asthma Father     ALLERGIES:  has No Known Allergies.  MEDICATIONS:  Current Outpatient Prescriptions  Medication Sig Dispense Refill  . acyclovir (ZOVIRAX) 400 MG tablet Take 1 tablet (400 mg total) by mouth 2 (two) times daily. 60 tablet 3  . azithromycin (ZITHROMAX) 500 MG tablet Take 1 tablet (500 mg total) by mouth daily. 5 tablet 0  . baclofen (LIORESAL) 10 MG tablet Take at bedtime to help with muscle cramps    . dexamethasone (DECADRON) 4 MG tablet Take 10 tablets (40 mg total) by mouth once a week. On the day of chemo treatment 40 tablet 2  . entecavir (BARACLUDE) 0.5 MG tablet Take 0.5 mg by mouth daily.     Marland Kitchen lidocaine-prilocaine (EMLA) cream Apply 1 application topically as needed. (Patient taking differently: Apply 1 application topically as needed (port). ) 30 g 6  . omeprazole (PRILOSEC) 20 MG capsule Take 1 capsule (20 mg total) by mouth daily. 30 capsule  2  . polyethylene glycol (MIRALAX / GLYCOLAX) packet Take 17 g by mouth as needed for moderate constipation. 28 each 1   Current Facility-Administered Medications  Medication Dose Route Frequency Provider Last Rate Last Dose  . sodium chloride 0.9 % injection 10 mL  10 mL Intravenous PRN Truitt Merle, MD       Facility-Administered Medications Ordered in Other Visits  Medication Dose Route Frequency Provider Last Rate Last Dose  . 0.9 %  sodium chloride infusion   Intravenous Continuous Truitt Merle, MD 10 mL/hr at 02/08/15 1150      REVIEW OF SYSTEMS:   Constitutional: Denies fevers, chills or abnormal night sweats Eyes: Denies blurriness of vision, double vision or watery eyes Ears, nose, mouth, throat, and face: Denies mucositis or sore throat Respiratory: Denies cough, dyspnea or wheezes Cardiovascular: Denies palpitation, chest  discomfort or lower extremity swelling Gastrointestinal:  Denies nausea, heartburn or change in bowel habits Skin: Denies abnormal skin rashes Lymphatics: Denies new lymphadenopathy or easy bruising Neurological:Denies numbness, tingling or new weaknesses Behavioral/Psych: Mood is stable, no new changes  All other systems were reviewed with the patient and are negative.  PHYSICAL EXAMINATION: ECOG PERFORMANCE STATUS: 1  Filed Vitals:   02/08/15 1113  BP: 103/67  Pulse: 79  Temp: 98 F (36.7 C)  Resp: 18   Filed Weights   02/08/15 1113  Weight: 137 lb 6.4 oz (62.324 kg)    GENERAL:alert, no distress and comfortable SKIN: skin color, texture, turgor are normal, no rashes or significant lesions EYES: normal, conjunctiva are pink and non-injected, sclera clear OROPHARYNX:no exudate, no erythema and lips, buccal mucosa, and tongue normal  NECK: supple, thyroid normal size, non-tender, without nodularity LYMPH:  no palpable lymphadenopathy in the cervical, axillary or inguinal LUNGS: clear to auscultation and percussion with normal breathing effort HEART: regular rate & rhythm and no murmurs and no lower extremity edema ABDOMEN:abdomen soft, non-tender and normal bowel sounds Musculoskeletal:no cyanosis of digits and no clubbing  PSYCH: alert & oriented x 3 with fluent speech NEURO: no focal motor/sensory deficits  LABORATORY DATA:  I have reviewed the data as listed CBC Latest Ref Rng 02/08/2015 02/01/2015 01/25/2015  WBC 3.9 - 10.3 10e3/uL 4.2 4.6 5.7  Hemoglobin 11.6 - 15.9 g/dL 11.4(L) 11.1(L) 12.4  Hematocrit 34.8 - 46.6 % 35.3 34.2(L) 38.0  Platelets 145 - 400 10e3/uL 238 211 187    CMP Latest Ref Rng 02/08/2015 02/01/2015 01/25/2015  Glucose 70 - 140 mg/dl 126 107 99  BUN 7.0 - 26.0 mg/dL 10.4 6.8(L) 9.5  Creatinine 0.6 - 1.1 mg/dL 0.7 0.7 0.8  Sodium 136 - 145 mEq/L 141 143 137  Potassium 3.5 - 5.1 mEq/L 3.8 3.6 4.0  Chloride 101 - 111 mmol/L - - -  CO2 22 - 29  mEq/L '25 27 25  ' Calcium 8.4 - 10.4 mg/dL 9.2 9.1 9.5  Total Protein 6.4 - 8.3 g/dL 6.9 6.4 7.4  Total Bilirubin 0.20 - 1.20 mg/dL 0.30 <0.30 0.41  Alkaline Phos 40 - 150 U/L 124 120 158(H)  AST 5 - 34 U/L 28 30 36(H)  ALT 0 - 55 U/L <'9 10 13     ' SPEP M-protein  09/15/2014: 4.2 10/08/14: 4.6 12/08/2014: 0.2  IgG mg/dl 11/03/2014: 3150  12/08/2014:  759  Kappa/lambda light chains levels and ration  11/03/14: 0.75, 152, 0.00 12/22/2014: 1.10, 2.60, 0.42  24 h urine UPEP/IFE and light chain: 11/03/2014: IFE showed a monoclonal IgG heavy chain with associated lambda light chain. M  protein was Undetectable    PATHOLOGY REPORT  Bone Marrow (BM) and Peripheral Blood (PB) 10/10/2014 [facility])  FINAL PATHOLOGIC DIAGNOSIS      BONE MARROW AND PERIPHERAL BLOOD:  Extensive involvement by plasma cell myeloma/leukemia.  See comment and CBC data.      COMMENT:  The bone marrow aspirate smears are spicular, cellular and  adequate for evaluation. Atypical plasma cells comprise 57% of  the cellularity (500 cell manual differential count). The plasma  cells have enlarged nuclei with scattered binucleated forms and  irregular nuclear contour. There is diminished multilineage  hematopoiesis with adequate maturation. Blasts are not increased  in numbers (less than 1% by manual differential counts). 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 There is no overt dysplasia of the  myeloid or erythroid lineages. Megakaryocytes are quantitatively  and qualitatively unremarkable. There is no lymphocytosis.    Examination of the bone marrow core biopsy reveals a markedly  hypercellular marrow for age (95%) with near complete replacement  by sheets of plasma cells. Rare myeloid and erythroid elements  are present. Megakaryocytes are sparse and focally clustered with  hypolobated and hyperchromatic nuclei. There  are no atypical  lymphoid aggregates or granulomas identified. Examination of the  bone marrow clot section reveals similar findings.    The peripheral blood demonstrates a leukocytosis comprised of  large plasma cells and normocytic anemia. Rouleaux is present.  There is a myeloid left shift with rare circulating blasts.    By flow cytometry on the peripheral blood, the abnormal plasma  cell population comprises 64% of the cellular elements and  expresses cytoplasmic CD138, CD45 and cLambda with dim expression  of CD19. The plasma cells are negative for surface CD2, CD3, CD4,  CD5, CD7, CD8, CD56, CD10, CD20, sKappa, sLambda, HLA-DR and  cKappa.    Overall, the findings demonstrate extensive involvement by plasma  cell myeloma/leukemia.     RADIOGRAPHIC STUDIES: I have personally reviewed the radiological images as listed and agreed with the findings in the report. No results found.  ASSESSMENT & PLAN: 45 year old [place], presented with anemia and leokocytosis   1. Acute plasma cell leukemia -She has [facility] cyborD at [facility] in early Sep 2016 -She is on CyBorD (weekly every 3 weeks) now, tolerating well. -[facility], and auto stem cell transplant is planned for mid-Jan,  Pending her new insurance coverage -continue acyclovir when she is on Velcade -She has had good response to induction chemotherapy. Her white blood count has come down to normal, no leukemia cells on the peripheral blood. Repeated SPEP showed M protein has dropped to 0.2 from pretreatment 4.6 -Lab reviewed, adequate for treatment, she'll proceed treatment today and continue weekly for at least 2 more weeks  2. Cough -near resolved now,  Received a course of azithromycin  3. Anorexia, constipation, abdominal pain -Likely secondary to dexa and chemotherapy, her symptoms has much improved since she started chemo  -I encouraged her to use  nutrition supplement, such as Ensure or boost -Follow up with the dietitian -EGD was negative, on PPI due to steroids   4. Chronic Hepatitis B  -She was started on entecavir at [facility], second months treatment was delayed due to the refill dealy, she did restart recently. - she was [facility] by liver clinic at [facility] ago, she would like to be seen in [place] due to her convenience.  5. Communication and Social support -She does not speak English, she is accompanied by interpreter to our cancer center -She has significant communication barrier, limited social support, and poor health literacy  -We discuss her financial concern about her BMT today, she has spoken with the Education officer, museum at [facility] regarding her financial concern about transplant.   Plan -C6D1 (every 3 weeks) CyBorD today, and continue weekly for at least 2 more weeks  -RTC for follow up in 2 weeks  -will check with [facility] BMT team to see if they will take her new insurance for the transplant.  All questions were answered. The patient knows to call the clinic with any problems, questions or concerns. I spent 20 minutes counseling the patient face to face. The total time spent in the appointment was 30 minutes and more than 50% was on counseling.     Truitt Merle, MD 02/08/2015 1:19 PM

## 2015-02-09 ENCOUNTER — Encounter: Payer: Self-pay | Admitting: Hematology

## 2015-02-09 NOTE — Progress Notes (Signed)
I placed velcade app for dr Burr Medico to sign. I will see patient on visit 02/15/15 for signatures for possible asst with velcade and cytoxan

## 2015-02-14 ENCOUNTER — Encounter: Payer: Self-pay | Admitting: Hematology

## 2015-02-14 NOTE — Progress Notes (Signed)
I called and spoke daughter/ the patient doesn't speak english. She said she is not sure if her mom will have an interptr with her at appt... I advise trying to get asst with Velcade and Cytoxan, and her mom will see me tomorrow for signature and need to bring proof of income. They live with grandparents so family of 51 and the aunt helps. She will have her aunt write note she helps mom-since no longer working. Zero income.

## 2015-02-15 ENCOUNTER — Encounter: Payer: Self-pay | Admitting: Hematology

## 2015-02-15 ENCOUNTER — Other Ambulatory Visit (HOSPITAL_BASED_OUTPATIENT_CLINIC_OR_DEPARTMENT_OTHER): Payer: Self-pay

## 2015-02-15 ENCOUNTER — Other Ambulatory Visit: Payer: Self-pay | Admitting: Hematology

## 2015-02-15 ENCOUNTER — Ambulatory Visit (HOSPITAL_BASED_OUTPATIENT_CLINIC_OR_DEPARTMENT_OTHER): Payer: Self-pay

## 2015-02-15 VITALS — BP 111/66 | HR 81 | Temp 97.8°F | Resp 18

## 2015-02-15 DIAGNOSIS — C9010 Plasma cell leukemia not having achieved remission: Secondary | ICD-10-CM

## 2015-02-15 DIAGNOSIS — Z5112 Encounter for antineoplastic immunotherapy: Secondary | ICD-10-CM

## 2015-02-15 DIAGNOSIS — Z5111 Encounter for antineoplastic chemotherapy: Secondary | ICD-10-CM

## 2015-02-15 LAB — CBC WITH DIFFERENTIAL/PLATELET
BASO%: 0.2 % (ref 0.0–2.0)
BASOS ABS: 0 10*3/uL (ref 0.0–0.1)
EOS ABS: 0 10*3/uL (ref 0.0–0.5)
EOS%: 0.2 % (ref 0.0–7.0)
HCT: 38 % (ref 34.8–46.6)
HGB: 12.2 g/dL (ref 11.6–15.9)
LYMPH%: 6.6 % — AB (ref 14.0–49.7)
MCH: 27.9 pg (ref 25.1–34.0)
MCHC: 32 g/dL (ref 31.5–36.0)
MCV: 87 fL (ref 79.5–101.0)
MONO#: 0.1 10*3/uL (ref 0.1–0.9)
MONO%: 1.5 % (ref 0.0–14.0)
NEUT#: 4.3 10*3/uL (ref 1.5–6.5)
NEUT%: 91.5 % — AB (ref 38.4–76.8)
Platelets: 213 10*3/uL (ref 145–400)
RBC: 4.37 10*6/uL (ref 3.70–5.45)
RDW: 16.2 % — ABNORMAL HIGH (ref 11.2–14.5)
WBC: 4.7 10*3/uL (ref 3.9–10.3)
lymph#: 0.3 10*3/uL — ABNORMAL LOW (ref 0.9–3.3)

## 2015-02-15 LAB — COMPREHENSIVE METABOLIC PANEL
ALK PHOS: 145 U/L (ref 40–150)
ALT: 13 U/L (ref 0–55)
ANION GAP: 10 meq/L (ref 3–11)
AST: 37 U/L — ABNORMAL HIGH (ref 5–34)
Albumin: 3.7 g/dL (ref 3.5–5.0)
BUN: 7.4 mg/dL (ref 7.0–26.0)
CO2: 23 mEq/L (ref 22–29)
Calcium: 9.7 mg/dL (ref 8.4–10.4)
Chloride: 108 mEq/L (ref 98–109)
Creatinine: 0.8 mg/dL (ref 0.6–1.1)
Glucose: 114 mg/dl (ref 70–140)
Potassium: 4.2 mEq/L (ref 3.5–5.1)
SODIUM: 142 meq/L (ref 136–145)
Total Protein: 7.3 g/dL (ref 6.4–8.3)

## 2015-02-15 MED ORDER — CYCLOPHOSPHAMIDE CHEMO INJECTION 1 GM
300.0000 mg/m2 | Freq: Once | INTRAMUSCULAR | Status: AC
  Administered 2015-02-15: 480 mg via INTRAVENOUS
  Filled 2015-02-15: qty 24

## 2015-02-15 MED ORDER — SODIUM CHLORIDE 0.9 % IJ SOLN
10.0000 mL | INTRAMUSCULAR | Status: DC | PRN
  Administered 2015-02-15: 10 mL via INTRAVENOUS
  Filled 2015-02-15: qty 10

## 2015-02-15 MED ORDER — BORTEZOMIB CHEMO SQ INJECTION 3.5 MG (2.5MG/ML)
1.5000 mg/m2 | Freq: Once | INTRAMUSCULAR | Status: AC
  Administered 2015-02-15: 2.5 mg via SUBCUTANEOUS
  Filled 2015-02-15: qty 2.5

## 2015-02-15 MED ORDER — SODIUM CHLORIDE 0.9 % IV SOLN
Freq: Once | INTRAVENOUS | Status: AC
  Administered 2015-02-15: 14:00:00 via INTRAVENOUS

## 2015-02-15 MED ORDER — ONDANSETRON HCL 8 MG PO TABS
8.0000 mg | ORAL_TABLET | Freq: Once | ORAL | Status: AC
  Administered 2015-02-15: 8 mg via ORAL

## 2015-02-15 MED ORDER — ONDANSETRON HCL 8 MG PO TABS
ORAL_TABLET | ORAL | Status: AC
  Filled 2015-02-15: qty 1

## 2015-02-15 MED ORDER — HEPARIN SOD (PORK) LOCK FLUSH 100 UNIT/ML IV SOLN
500.0000 [IU] | Freq: Once | INTRAVENOUS | Status: AC
  Administered 2015-02-15: 500 [IU] via INTRAVENOUS
  Filled 2015-02-15: qty 5

## 2015-02-15 MED ORDER — TRAMADOL HCL 50 MG PO TABS: 50.0000 mg | ORAL_TABLET | Freq: Four times a day (QID) | ORAL | Status: DC | PRN

## 2015-02-15 NOTE — Progress Notes (Signed)
I faxed gooddays-cytoxan and velcade [phone] poss asst

## 2015-02-15 NOTE — Patient Instructions (Signed)
[facility] Discharge Instructions for Patients Receiving Chemotherapy  Today you received the following chemotherapy agents:  Velcade and Cytoxan.  To help prevent nausea and vomiting after your treatment, we encourage you to take your nausea medication as directed.   If you develop nausea and vomiting that is not controlled by your nausea medication, call the clinic.   BELOW ARE SYMPTOMS THAT SHOULD BE REPORTED IMMEDIATELY:  *FEVER GREATER THAN 100.5 F  *CHILLS WITH OR WITHOUT FEVER  NAUSEA AND VOMITING THAT IS NOT CONTROLLED WITH YOUR NAUSEA MEDICATION  *UNUSUAL SHORTNESS OF BREATH  *UNUSUAL BRUISING OR BLEEDING  TENDERNESS IN MOUTH AND THROAT WITH OR WITHOUT PRESENCE OF ULCERS  *URINARY PROBLEMS  *BOWEL PROBLEMS  UNUSUAL RASH Items with * indicate a potential emergency and should be followed up as soon as possible.  Feel free to call the clinic you have any questions or concerns. The clinic phone number is [phone].  Please show the CHEMO ALERT CARD at check-in to the Emergency Department and triage nurse.   

## 2015-02-16 ENCOUNTER — Encounter: Payer: Self-pay | Admitting: Hematology

## 2015-02-16 LAB — KAPPA/LAMBDA LIGHT CHAINS
IG KAPPA FREE LIGHT CHAIN: 9.59 mg/L (ref 3.30–19.40)
Ig Lambda Free Light Chain: 14.35 mg/L (ref 5.71–26.30)
Kappa/Lambda FluidC Ratio: 0.67 (ref 0.26–1.65)

## 2015-02-16 LAB — IMMUNOFIXATION ELECTROPHORESIS
IgA, Qn, Serum: 55 mg/dL — ABNORMAL LOW (ref 87–352)
IgG, Qn, Serum: 860 mg/dL (ref 700–1600)
IgM, Qn, Serum: 21 mg/dL — ABNORMAL LOW (ref 26–217)

## 2015-02-16 NOTE — Progress Notes (Signed)
Per Lenna Sciara for velcade asst. They need her insurance card. See prev notes--she has not got [facility] card yet... [phone] option #2#2

## 2015-02-20 LAB — PROTEIN ELECTROPHORESIS, SERUM

## 2015-02-20 NOTE — Progress Notes (Signed)
This encounter was created in error - please disregard.

## 2015-02-22 ENCOUNTER — Other Ambulatory Visit (HOSPITAL_BASED_OUTPATIENT_CLINIC_OR_DEPARTMENT_OTHER): Payer: Self-pay

## 2015-02-22 ENCOUNTER — Other Ambulatory Visit: Payer: Self-pay | Admitting: Hematology

## 2015-02-22 ENCOUNTER — Ambulatory Visit (HOSPITAL_BASED_OUTPATIENT_CLINIC_OR_DEPARTMENT_OTHER): Payer: Self-pay | Admitting: Hematology

## 2015-02-22 ENCOUNTER — Encounter: Payer: Self-pay | Admitting: Hematology

## 2015-02-22 ENCOUNTER — Ambulatory Visit (HOSPITAL_BASED_OUTPATIENT_CLINIC_OR_DEPARTMENT_OTHER): Payer: BLUE CROSS/BLUE SHIELD

## 2015-02-22 VITALS — BP 111/57 | HR 105 | Temp 98.4°F | Resp 18 | Ht 63.0 in | Wt 136.8 lb

## 2015-02-22 VITALS — HR 93

## 2015-02-22 DIAGNOSIS — R630 Anorexia: Secondary | ICD-10-CM

## 2015-02-22 DIAGNOSIS — J069 Acute upper respiratory infection, unspecified: Secondary | ICD-10-CM

## 2015-02-22 DIAGNOSIS — Z5111 Encounter for antineoplastic chemotherapy: Secondary | ICD-10-CM | POA: Diagnosis not present

## 2015-02-22 DIAGNOSIS — Z5112 Encounter for antineoplastic immunotherapy: Secondary | ICD-10-CM | POA: Diagnosis not present

## 2015-02-22 DIAGNOSIS — R109 Unspecified abdominal pain: Secondary | ICD-10-CM

## 2015-02-22 DIAGNOSIS — C9010 Plasma cell leukemia not having achieved remission: Secondary | ICD-10-CM

## 2015-02-22 DIAGNOSIS — D649 Anemia, unspecified: Secondary | ICD-10-CM

## 2015-02-22 DIAGNOSIS — B181 Chronic viral hepatitis B without delta-agent: Secondary | ICD-10-CM

## 2015-02-22 DIAGNOSIS — K5900 Constipation, unspecified: Secondary | ICD-10-CM

## 2015-02-22 LAB — CBC WITH DIFFERENTIAL/PLATELET
BASO%: 0.3 % (ref 0.0–2.0)
Basophils Absolute: 0 10*3/uL (ref 0.0–0.1)
EOS%: 0.7 % (ref 0.0–7.0)
Eosinophils Absolute: 0 10*3/uL (ref 0.0–0.5)
HEMATOCRIT: 36.1 % (ref 34.8–46.6)
HEMOGLOBIN: 11.7 g/dL (ref 11.6–15.9)
LYMPH#: 1 10*3/uL (ref 0.9–3.3)
LYMPH%: 15.9 % (ref 14.0–49.7)
MCH: 28.5 pg (ref 25.1–34.0)
MCHC: 32.4 g/dL (ref 31.5–36.0)
MCV: 87.8 fL (ref 79.5–101.0)
MONO#: 0.6 10*3/uL (ref 0.1–0.9)
MONO%: 9.6 % (ref 0.0–14.0)
NEUT%: 73.5 % (ref 38.4–76.8)
NEUTROS ABS: 4.4 10*3/uL (ref 1.5–6.5)
NRBC: 0 % (ref 0–0)
PLATELETS: 191 10*3/uL (ref 145–400)
RBC: 4.11 10*6/uL (ref 3.70–5.45)
RDW: 14.8 % — AB (ref 11.2–14.5)
WBC: 6 10*3/uL (ref 3.9–10.3)

## 2015-02-22 LAB — COMPREHENSIVE METABOLIC PANEL
ALT: 15 U/L (ref 0–55)
ANION GAP: 8 meq/L (ref 3–11)
AST: 44 U/L — ABNORMAL HIGH (ref 5–34)
Albumin: 3.7 g/dL (ref 3.5–5.0)
Alkaline Phosphatase: 141 U/L (ref 40–150)
BILIRUBIN TOTAL: 0.46 mg/dL (ref 0.20–1.20)
BUN: 8 mg/dL (ref 7.0–26.0)
CALCIUM: 9.3 mg/dL (ref 8.4–10.4)
CO2: 25 meq/L (ref 22–29)
CREATININE: 0.7 mg/dL (ref 0.6–1.1)
Chloride: 106 mEq/L (ref 98–109)
EGFR: >90 mL/min/{1.73_m2} (ref >90)
Glucose: 87 mg/dl (ref 70–140)
Potassium: 4.2 mEq/L (ref 3.5–5.1)
Sodium: 139 mEq/L (ref 136–145)
TOTAL PROTEIN: 7.1 g/dL (ref 6.4–8.3)

## 2015-02-22 MED ORDER — SODIUM CHLORIDE 0.9 % IJ SOLN
10.0000 mL | INTRAMUSCULAR | Status: DC | PRN
  Administered 2015-02-22: 10 mL via INTRAVENOUS
  Filled 2015-02-22: qty 10

## 2015-02-22 MED ORDER — BORTEZOMIB CHEMO SQ INJECTION 3.5 MG (2.5MG/ML)
1.5000 mg/m2 | Freq: Once | INTRAMUSCULAR | Status: AC
  Administered 2015-02-22: 2.5 mg via SUBCUTANEOUS
  Filled 2015-02-22: qty 2.5

## 2015-02-22 MED ORDER — HEPARIN SOD (PORK) LOCK FLUSH 100 UNIT/ML IV SOLN
500.0000 [IU] | Freq: Once | INTRAVENOUS | Status: AC
  Administered 2015-02-22: 500 [IU] via INTRAVENOUS
  Filled 2015-02-22: qty 5

## 2015-02-22 MED ORDER — ONDANSETRON HCL 8 MG PO TABS
ORAL_TABLET | ORAL | Status: AC
  Filled 2015-02-22: qty 1

## 2015-02-22 MED ORDER — ONDANSETRON HCL 8 MG PO TABS
8.0000 mg | ORAL_TABLET | Freq: Once | ORAL | Status: AC
  Administered 2015-02-22: 8 mg via ORAL

## 2015-02-22 MED ORDER — SODIUM CHLORIDE 0.9 % IV SOLN
300.0000 mg/m2 | Freq: Once | INTRAVENOUS | Status: AC
  Administered 2015-02-22: 480 mg via INTRAVENOUS
  Filled 2015-02-22: qty 24

## 2015-02-22 MED ORDER — SODIUM CHLORIDE 0.9 % IV SOLN
Freq: Once | INTRAVENOUS | Status: AC
  Administered 2015-02-22: 12:00:00 via INTRAVENOUS

## 2015-02-22 MED ORDER — AZITHROMYCIN 500 MG PO TABS: 500.0000 mg | ORAL_TABLET | Freq: Every day | ORAL | Status: DC

## 2015-02-22 NOTE — Progress Notes (Signed)
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   10/10/2014 Miscellaneous Peripheral blood chemistry and leukocytosis with total white count 78K, comprised of large plasma cells and his normocytic anemia. There is a myeloid left shift with previous surgical radium blasts. Flow cytometry showed 64% plasma cells   10/10/2014 Bone Marrow Biopsy Markedly hypercellular marrow (95%), Atypical plasma cells comprise 57% of the cellularity. There was diminished multilineage in hematopoiesis with adequate maturation. Breasts less than 1%), no overt dysplasia of the myeloid or erythroid lineages.    10/10/2014 Initial Diagnosis Plasma cell leukemia   10/13/2014 -  Chemotherapy CyborD (cytoxan 334m/m2 iv, bortezomib 1.5 mg/m, dexamethasone 40 mg, weekly every 28 days, bortezomib and dexamethasone was given twice weekly for 2 weeks during the first cycle)    HISTORY OF PRESENTING ILLNESS:  Sheryl [name] 45y.o. female is here because of recently diagnosed plasma cell leukemia. [facility] and is here to establish her local oncological care with [place] She [place] does not speak [place] she is accompanied to the clinic by her daughter and interpreter.  She presented to our hospital lth with worsening dyspnea, fatigue, cough, subjective fevers and chills, and was admitted on 09/14/2014. She was found to  have allergy WBC 54.1K, hemoglobin 8.1, plt 310, she was treated with symptom management, and was discharged home on 09/17/2014, with a plan to follow-up with hematology. She represents to emergency room on 10/07/2014, was found to have white count of 78K, and worsening anemia with hemoglobin 6. She was seen by my partner Dr. EJonette Evaand plasma cell leukemia was suspected. [facility] Bone marrow biopsy was done, which confirmed plasma cell leukemia, and she started chemotherapy with Velcade, Cytoxan and dexamethasone. She received weekly dose twice, last dose on 10/20/2014. She was discharged to home afterwards. She had a mediport placed during her hospitalization.  She has moderate fatigue, low appetie, she lost about 13 lbs in the past few months. She has moderate abdominal pain, 5-6/10, persistent, she does not take any pain meds.   I reviewed her medical records extensively, and discussed her case with Dr. EJorje Guildand BHaskellprogram coordinator.  CURRENT THERAPY: Cytoxan 300 mg/m, IV, bortezomib 1.5 mg/m subcutaneous, and dexamethasone 40 milligrams, weekly, every 4 weeks, [facility] bortezomib twice a week for the first 4 weeks  INTERIM HISTORY: Sheryl [name] returns for follow up and weekly chemo. She came in by herself today and we had a [place]ranslator on the phone during her visit. She complains about sore throat, mild hoarseness, dry cough and body acheness since a few days ago, her daughter has had similar symptoms for the past one week. She reports intermittent fever at home, but afebrile here. No other complains.    MEDICAL HISTORY:  Past Medical History  Diagnosis Date  . GERD (gastroesophageal reflux disease)   . Hepatitis   .  History of positive PPD     DX 2011--  CXR DONE NO EVIDENCE  . Hydronephrosis, right   . Right ureteral stone   . History of ureter stent   . Chills  with fever     intermittently since d/c from hospital  . Dysuria-frequency syndrome     w/ pink urine  . Urosepsis 8/14    admitted to [facility]  . Pneumonia   . Neuromuscular disorder (HCC)     legs numb intermittently    SURGICAL HISTORY: Past Surgical History  Procedure Laterality Date  . Cystoscopy w/ ureteral stent placement Right 09/25/2012    Procedure: CYSTOSCOPY WITH RETROGRADE PYELOGRAM/URETERAL STENT PLACEMENT;  Surgeon: Alexis Frock, MD;  Location: [facility];  Service: Urology;  Laterality: Right;  . Liver biopsy    . Right vats w/ drainage peural effusion and bx's  10-30-2008  . Removal of uterine cyst      years ago  . Other surgical history Right     removal of ovarian cyst  . Cystoscopy with retrograde pyelogram, ureteroscopy and stent placement Right 10/15/2012    Procedure: CYSTOSCOPY WITH RETROGRADE PYELOGRAM, URETEROSCOPY AND REMOVAL STENT WITH  STENT PLACEMENT;  Surgeon: Alexis Frock, MD;  Location: [facility];  Service: Urology;  Laterality: Right;  . Holmium laser application Right 9/56/2130    Procedure: HOLMIUM LASER APPLICATION;  Surgeon: Alexis Frock, MD;  Location: [facility];  Service: Urology;  Laterality: Right;  . Esophagogastroduodenoscopy (egd) with propofol N/A 11/16/2014    Procedure: ESOPHAGOGASTRODUODENOSCOPY (EGD) WITH PROPOFOL;  Surgeon: Milus Banister, MD;  Location: [facility];  Service: Endoscopy;  Laterality: N/A;    SOCIAL HISTORY: Social History   Social History  . Marital Status: Single    Spouse Name: N/A  . Number of Children: 2  . Years of Education: N/A   Occupational History  . She used to work at a [employer]    Social History Main Topics  . Smoking status: Never Smoker   . Smokeless tobacco: Not on file  . Alcohol Use: No  . Drug Use: No  . Sexual Activity: Not Currently    Birth Control/ Protection: Abstinence   Other Topics Concern  . Not on file   Social History Narrative     FAMILY HISTORY: Family History  Problem Relation Age of Onset  . Stomach cancer Mother   . Lung disease Father   . Asthma Father     ALLERGIES:  has No Known Allergies.  MEDICATIONS:  Current Outpatient Prescriptions  Medication Sig Dispense Refill  . acyclovir (ZOVIRAX) 400 MG tablet Take 1 tablet (400 mg total) by mouth 2 (two) times daily. 60 tablet 3  . azithromycin (ZITHROMAX) 500 MG tablet Take 1 tablet (500 mg total) by mouth daily. 5 tablet 0  . baclofen (LIORESAL) 10 MG tablet Take at bedtime to help with muscle cramps    . dexamethasone (DECADRON) 4 MG tablet Take 10 tablets (40 mg total) by mouth once a week. On the day of chemo treatment 40 tablet 2  . entecavir (BARACLUDE) 0.5 MG tablet Take 0.5 mg by mouth daily.     Marland Kitchen lidocaine-prilocaine (EMLA) cream Apply 1 application topically as needed. (Patient taking differently: Apply 1 application topically as needed (port). ) 30 g 6  . omeprazole (PRILOSEC) 20 MG capsule Take 1 capsule (20 mg total) by mouth daily. 30 capsule 2  . polyethylene glycol (MIRALAX / GLYCOLAX) packet Take 17 g by mouth  as needed for moderate constipation. 28 each 1  . traMADol (ULTRAM) 50 MG tablet Take 1 tablet (50 mg total) by mouth every 6 (six) hours as needed. 30 tablet 0   No current facility-administered medications for this visit.    REVIEW OF SYSTEMS:   Constitutional: (+) intermittent fevers, no chills or abnormal night sweats Eyes: Denies blurriness of vision, double vision or watery eyes Ears, nose, mouth, throat, and face: Denies mucositis or sore throat Respiratory: Denies cough, dyspnea or wheezes Cardiovascular: Denies palpitation, chest discomfort or lower extremity swelling Gastrointestinal:  Denies nausea, heartburn or change in bowel habits Skin: Denies abnormal skin rashes Lymphatics: Denies new lymphadenopathy or easy bruising Neurological:Denies numbness, tingling or new weaknesses Behavioral/Psych: Mood is stable,  no new changes  All other systems were reviewed with the patient and are negative.  PHYSICAL EXAMINATION: ECOG PERFORMANCE STATUS: 1  Filed Vitals:   02/22/15 1047  BP: 111/57  Pulse: 105  Temp: 98.4 F (36.9 C)  Resp: 18   Filed Weights   02/22/15 1047  Weight: 136 lb 12.8 oz (62.052 kg)    GENERAL:alert, no distress and comfortable SKIN: skin color, texture, turgor are normal, no rashes or significant lesions EYES: normal, conjunctiva are pink and non-injected, sclera clear OROPHARYNX:no exudate, no erythema and lips, buccal mucosa, and tongue normal  NECK: supple, thyroid normal size, non-tender, without nodularity LYMPH:  no palpable lymphadenopathy in the cervical, axillary or inguinal LUNGS: clear to auscultation and percussion with normal breathing effort HEART: regular rate & rhythm and no murmurs and no lower extremity edema ABDOMEN:abdomen soft, non-tender and normal bowel sounds Musculoskeletal:no cyanosis of digits and no clubbing  PSYCH: alert & oriented x 3 with fluent speech NEURO: no focal motor/sensory deficits  LABORATORY DATA:  I have reviewed the data as listed CBC Latest Ref Rng 02/22/2015 02/15/2015 02/08/2015  WBC 3.9 - 10.3 10e3/uL 6.0 4.7 4.2  Hemoglobin 11.6 - 15.9 g/dL 11.7 12.2 11.4(L)  Hematocrit 34.8 - 46.6 % 36.1 38.0 35.3  Platelets 145 - 400 10e3/uL 191 213 238    CMP Latest Ref Rng 02/22/2015 02/15/2015 02/15/2015  Glucose 70 - 140 mg/dl 87 114 -  BUN 7.0 - 26.0 mg/dL 8.0 7.4 -  Creatinine 0.6 - 1.1 mg/dL 0.7 0.8 -  Sodium 136 - 145 mEq/L 139 142 -  Potassium 3.5 - 5.1 mEq/L 4.2 4.2 -  Chloride 101 - 111 mmol/L - - -  CO2 22 - 29 mEq/L 25 23 -  Calcium 8.4 - 10.4 mg/dL 9.3 9.7 -  Total Protein 6.4 - 8.3 g/dL 7.1 7.3 Test Not Performed.  Total Bilirubin 0.20 - 1.20 mg/dL 0.46 <0.30 -  Alkaline Phos 40 - 150 U/L 141 145 -  AST 5 - 34 U/L 44(H) 37(H) -  ALT 0 - 55 U/L 15 13 -     SPEP M-protein  09/15/2014: 4.2 10/08/14: 4.6 12/08/2014:  0.2 02/15/2015: not sufficient sample for test    IgG mg/dl 11/03/2014: 3150  12/08/2014:  759 02/14/2014: 860  Kappa/lambda light chains levels and ration  11/03/14: 0.75, 152, 0.00 12/22/2014: 1.10, 2.60, 0.42 02/15/2015: 9.59,14.35, 0.67  24 h urine UPEP/IFE and light chain: 11/03/2014: IFE showed a monoclonal IgG heavy chain with associated lambda light chain. M protein was Undetectable    PATHOLOGY REPORT  Bone Marrow (BM) and Peripheral Blood (PB) 10/10/2014 [facility])  FINAL PATHOLOGIC DIAGNOSIS      BONE MARROW AND PERIPHERAL BLOOD:  Extensive involvement by plasma cell myeloma/leukemia.  See comment and CBC data.      COMMENT:  The bone marrow aspirate smears are spicular, cellular and  adequate for evaluation. Atypical plasma cells comprise 57% of  the cellularity (500 cell manual differential count). The plasma  cells have enlarged nuclei with scattered binucleated forms and  irregular nuclear contour. There is diminished multilineage  hematopoiesis with adequate maturation. Blasts are not increased  in numbers (less than 1% by manual differential counts). 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 There is no overt dysplasia of the  myeloid or erythroid lineages. Megakaryocytes are quantitatively  and qualitatively unremarkable. There is no lymphocytosis.    Examination of the bone marrow core biopsy reveals a markedly  hypercellular marrow for age (95%) with near complete replacement  by sheets of plasma cells. Rare myeloid and erythroid elements  are present. Megakaryocytes are sparse and focally clustered with  hypolobated and hyperchromatic nuclei. There are no atypical  lymphoid aggregates or granulomas identified. Examination of the  bone marrow clot section reveals similar findings.    The peripheral blood demonstrates a leukocytosis comprised of  large plasma  cells and normocytic anemia. Rouleaux is present.  There is a myeloid left shift with rare circulating blasts.    By flow cytometry on the peripheral blood, the abnormal plasma  cell population comprises 64% of the cellular elements and  expresses cytoplasmic CD138, CD45 and cLambda with dim expression  of CD19. The plasma cells are negative for surface CD2, CD3, CD4,  CD5, CD7, CD8, CD56, CD10, CD20, sKappa, sLambda, HLA-DR and  cKappa.    Overall, the findings demonstrate extensive involvement by plasma  cell myeloma/leukemia.     RADIOGRAPHIC STUDIES: I have personally reviewed the radiological images as listed and agreed with the findings in the report. No results found.  ASSESSMENT & PLAN: 45 year old [place], presented with anemia and leokocytosis   1. Acute plasma cell leukemia -She has started cyborD at [facility] in early Sep 2016 -She is on CyBorD (weekly every 3 weeks) now, tolerating well. -She is being followed by Dr. Tiana Loft at [facility] for treatment, and auto stem cell transplant is planned for mid-Feb -continue acyclovir when she is on Velcade -She has had good response to induction chemotherapy. Her white blood count has come down to normal, no leukemia cells on the peripheral blood. Repeated SPEP showed M protein has dropped to 0.2 from pretreatment 4.6 -Lab reviewed, adequate for treatment, she'll proceed treatment today which is the last dose before her transplant   2. URI -afebrile here, normal WBC, I called in 5 days azithromycin 547m daily for her today   3. Anorexia, constipation, abdominal pain -Likely secondary to dexa and chemotherapy, her symptoms has much improved since she started chemo  -I encouraged her to use nutrition supplement, such as Ensure or boost -Follow up with the dietitian -EGD was negative, on PPI due to steroids   4. Chronic Hepatitis B  -[facility], second months treatment was delayed due  to the refill dealy, she did restart recently. - [facility], [place]. -[place]   5. Communication and Social support -She does not speak English, she is accompanied by interpreter to our cancer center -  She has significant communication barrier, limited social support, and poor health literacy  -We discuss her financial concern about her BMT today, she has spoken with the Education officer, museum at [facility] regarding her financial concern about transplant.   Plan -[phone] (every 3 weeks) CyBorD today, this will be her last treatment before her bone marrow transplant which is scheduled for mid Feb in [facility]  -She will return to [facility] on 1/31 for pre-transplant evaluation  -I will see her back after her transplant   All questions were answered. The patient knows to call the clinic with any problems, questions or concerns. I spent 20 minutes counseling the patient face to face. The total time spent in the appointment was 30 minutes and more than 50% was on counseling.     Truitt Merle, MD 02/22/2015 11:35 AM

## 2015-02-22 NOTE — Patient Instructions (Signed)
[facility] Discharge Instructions for Patients Receiving Chemotherapy  Today you received the following chemotherapy agents cytoxan/velcade  To help prevent nausea and vomiting after your treatment, we encourage you to take your nausea medication as directed  If you develop nausea and vomiting that is not controlled by your nausea medication, call the clinic.   BELOW ARE SYMPTOMS THAT SHOULD BE REPORTED IMMEDIATELY:  *FEVER GREATER THAN 100.5 F  *CHILLS WITH OR WITHOUT FEVER  NAUSEA AND VOMITING THAT IS NOT CONTROLLED WITH YOUR NAUSEA MEDICATION  *UNUSUAL SHORTNESS OF BREATH  *UNUSUAL BRUISING OR BLEEDING  TENDERNESS IN MOUTH AND THROAT WITH OR WITHOUT PRESENCE OF ULCERS  *URINARY PROBLEMS  *BOWEL PROBLEMS  UNUSUAL RASH Items with * indicate a potential emergency and should be followed up as soon as possible.  Feel free to call the clinic you have any questions or concerns. The clinic phone number is [phone].  

## 2015-03-02 ENCOUNTER — Encounter: Payer: Self-pay | Admitting: Hematology

## 2015-03-02 NOTE — Progress Notes (Signed)
I left mess for [facility]  [phone] ext [phone] she needed bcbs card# for velcade application. Patient to bring in card at next appt. Deana called and got the card#

## 2015-03-27 ENCOUNTER — Ambulatory Visit: Payer: Self-pay | Admitting: Internal Medicine

## 2015-03-29 ENCOUNTER — Ambulatory Visit: Payer: Self-pay | Admitting: Internal Medicine

## 2015-04-27 ENCOUNTER — Telehealth: Payer: Self-pay | Admitting: Hematology

## 2015-04-27 ENCOUNTER — Other Ambulatory Visit: Payer: Self-pay | Admitting: *Deleted

## 2015-04-27 DIAGNOSIS — C9011 Plasma cell leukemia in remission: Secondary | ICD-10-CM

## 2015-04-27 NOTE — Telephone Encounter (Signed)
sch appt per 3/24 pof. Pt will get calender faxed to [facility] per desk nurse

## 2015-05-02 ENCOUNTER — Telehealth: Payer: Self-pay | Admitting: Hematology

## 2015-05-02 ENCOUNTER — Encounter: Payer: BLUE CROSS/BLUE SHIELD | Admitting: Hematology

## 2015-05-02 ENCOUNTER — Encounter: Payer: Self-pay | Admitting: Hematology

## 2015-05-02 ENCOUNTER — Other Ambulatory Visit: Payer: BLUE CROSS/BLUE SHIELD

## 2015-05-02 NOTE — Telephone Encounter (Signed)
pt cld to cx appt stating not feeling well-r/s for 4/4

## 2015-05-02 NOTE — Progress Notes (Signed)
This encounter was created in error - please disregard.

## 2015-05-03 ENCOUNTER — Telehealth: Payer: Self-pay | Admitting: *Deleted

## 2015-05-03 NOTE — Telephone Encounter (Signed)
Noted pt cancelled office visit as well as lab appts on 05/02/15.   Wells Guiles, RN @ [facility] notified.  Dr. Burr Medico made aware.  Attempted to call listed phone numbers of other relatives unsuccessfully.  Left message on sister's voice mail requesting a call back from pt.   Called pt unsuccessfully.  Left message on voice mail requesting a call back from pt.

## 2015-05-07 ENCOUNTER — Telehealth: Payer: Self-pay | Admitting: *Deleted

## 2015-05-07 NOTE — Telephone Encounter (Signed)
Received call from [place] @ [facility] stating pt admitted there today with fever.  Pt will not make appt tomorrow with Dr Burr Medico.  Message to Dr Burr Medico.

## 2015-05-08 ENCOUNTER — Other Ambulatory Visit: Payer: BLUE CROSS/BLUE SHIELD

## 2015-05-08 ENCOUNTER — Ambulatory Visit: Payer: BLUE CROSS/BLUE SHIELD | Admitting: Hematology

## 2015-05-09 ENCOUNTER — Other Ambulatory Visit: Payer: BLUE CROSS/BLUE SHIELD

## 2015-05-09 ENCOUNTER — Ambulatory Visit: Payer: BLUE CROSS/BLUE SHIELD | Admitting: Hematology

## 2015-05-14 ENCOUNTER — Other Ambulatory Visit: Payer: Self-pay | Admitting: *Deleted

## 2015-05-14 ENCOUNTER — Telehealth: Payer: Self-pay | Admitting: Hematology

## 2015-05-14 NOTE — Telephone Encounter (Signed)
per pof to sch pt appt-cld & spoke to sister Eh and gave appt time & date for 4/12

## 2015-05-16 ENCOUNTER — Ambulatory Visit (HOSPITAL_BASED_OUTPATIENT_CLINIC_OR_DEPARTMENT_OTHER): Payer: BLUE CROSS/BLUE SHIELD | Admitting: Hematology

## 2015-05-16 ENCOUNTER — Telehealth: Payer: Self-pay | Admitting: Hematology

## 2015-05-16 ENCOUNTER — Other Ambulatory Visit (HOSPITAL_BASED_OUTPATIENT_CLINIC_OR_DEPARTMENT_OTHER): Payer: BLUE CROSS/BLUE SHIELD

## 2015-05-16 ENCOUNTER — Encounter: Payer: Self-pay | Admitting: Hematology

## 2015-05-16 ENCOUNTER — Ambulatory Visit: Payer: BLUE CROSS/BLUE SHIELD

## 2015-05-16 VITALS — BP 96/70 | HR 95 | Temp 98.6°F | Resp 18 | Ht 63.0 in | Wt 125.5 lb

## 2015-05-16 DIAGNOSIS — E876 Hypokalemia: Secondary | ICD-10-CM | POA: Diagnosis not present

## 2015-05-16 DIAGNOSIS — Z95828 Presence of other vascular implants and grafts: Secondary | ICD-10-CM

## 2015-05-16 DIAGNOSIS — B181 Chronic viral hepatitis B without delta-agent: Secondary | ICD-10-CM

## 2015-05-16 DIAGNOSIS — R630 Anorexia: Secondary | ICD-10-CM | POA: Diagnosis not present

## 2015-05-16 DIAGNOSIS — C9011 Plasma cell leukemia in remission: Secondary | ICD-10-CM

## 2015-05-16 DIAGNOSIS — D649 Anemia, unspecified: Secondary | ICD-10-CM

## 2015-05-16 LAB — COMPREHENSIVE METABOLIC PANEL
ALT: <9 U/L (ref 0–55)
AST: 38 U/L — AB (ref 5–34)
Albumin: 3.4 g/dL — ABNORMAL LOW (ref 3.5–5.0)
Alkaline Phosphatase: 163 U/L — ABNORMAL HIGH (ref 40–150)
Anion Gap: 7 mEq/L (ref 3–11)
BUN: 6.4 mg/dL — AB (ref 7.0–26.0)
CHLORIDE: 107 meq/L (ref 98–109)
CO2: 28 meq/L (ref 22–29)
Calcium: 9.6 mg/dL (ref 8.4–10.4)
Creatinine: 0.6 mg/dL (ref 0.6–1.1)
EGFR: >90 mL/min/{1.73_m2} (ref >90)
GLUCOSE: 118 mg/dL (ref 70–140)
Potassium: 3.3 mEq/L — ABNORMAL LOW (ref 3.5–5.1)
SODIUM: 142 meq/L (ref 136–145)
Total Bilirubin: 0.34 mg/dL (ref 0.20–1.20)
Total Protein: 6.7 g/dL (ref 6.4–8.3)

## 2015-05-16 LAB — CBC WITH DIFFERENTIAL/PLATELET
BASO%: 0.3 % (ref 0.0–2.0)
Basophils Absolute: 0 10*3/uL (ref 0.0–0.1)
EOS%: 1.4 % (ref 0.0–7.0)
Eosinophils Absolute: 0.1 10*3/uL (ref 0.0–0.5)
HEMATOCRIT: 37.1 % (ref 34.8–46.6)
HGB: 12.3 g/dL (ref 11.6–15.9)
LYMPH#: 1.6 10*3/uL (ref 0.9–3.3)
LYMPH%: 26.1 % (ref 14.0–49.7)
MCH: 29 pg (ref 25.1–34.0)
MCHC: 33.3 g/dL (ref 31.5–36.0)
MCV: 87.3 fL (ref 79.5–101.0)
MONO#: 0.9 10*3/uL (ref 0.1–0.9)
MONO%: 14 % (ref 0.0–14.0)
NEUT#: 3.5 10*3/uL (ref 1.5–6.5)
NEUT%: 58.2 % (ref 38.4–76.8)
Platelets: 169 10*3/uL (ref 145–400)
RBC: 4.25 10*6/uL (ref 3.70–5.45)
RDW: 19.9 % — ABNORMAL HIGH (ref 11.2–14.5)
WBC: 6.1 10*3/uL (ref 3.9–10.3)

## 2015-05-16 LAB — MAGNESIUM: Magnesium: 1.6 mg/dl (ref 1.5–2.5)

## 2015-05-16 MED ORDER — SODIUM CHLORIDE 0.9% FLUSH
10.0000 mL | INTRAVENOUS | Status: DC | PRN
  Administered 2015-05-16: 10 mL via INTRAVENOUS
  Filled 2015-05-16: qty 10

## 2015-05-16 MED ORDER — HEPARIN SOD (PORK) LOCK FLUSH 100 UNIT/ML IV SOLN
500.0000 [IU] | Freq: Once | INTRAVENOUS | Status: AC
  Administered 2015-05-16: 500 [IU] via INTRAVENOUS
  Filled 2015-05-16: qty 5

## 2015-05-16 NOTE — Telephone Encounter (Signed)
Gave patient avs report and appointments for April thru June.  °

## 2015-05-16 NOTE — Progress Notes (Signed)
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   10/10/2014 Miscellaneous Peripheral blood chemistry and leukocytosis with total white count 78K, comprised of large plasma cells and his normocytic anemia. There is a myeloid left shift with previous surgical radium blasts. Flow cytometry showed 64% plasma cells   10/10/2014 Bone Marrow Biopsy Markedly hypercellular marrow (95%), Atypical plasma cells comprise 57% of the cellularity. There was diminished multilineage in hematopoiesis with adequate maturation. Breasts less than 1%), no overt dysplasia of the myeloid or erythroid lineages.    10/10/2014 Initial Diagnosis Plasma cell leukemia   10/13/2014 -  Chemotherapy CyborD (cytoxan 32m/m2 iv, bortezomib 1.5 mg/m, dexamethasone 40 mg, weekly every 28 days, bortezomib and dexamethasone was given twice weekly for 2 weeks during the first cycle)   [facility]2015 [facility] course was complicated by sepsis from Escherichia coli bacteremia and associated colitis, she was discharged home on 04/27/2015.   05/07/2015 - 05/12/2015 [facility], [facility]. ID workup was negative, EGD showed evidence of gastritis and duodenitis, no H. pylori or CMV.    HISTORY OF PRESENTING ILLNESS:  Sheryl [name] 45y.o. female is  here because of recently diagnosed plasma cell leukemia. [facility] and is here to establish her local oncological care with [place] she is accompanied to the clinic by her daughter and interpreter.  She presented to our hospital lth with worsening dyspnea, fatigue, cough, subjective fevers and chills, and was admitted on 09/14/2014. She was found to have allergy WBC 54.1K, hemoglobin 8.1, plt 310, she was treated with symptom management, and was discharged home on 09/17/2014, with a plan to follow-up with hematology. She represents to emergency room on 10/07/2014, was found to have white count of 78K, and worsening anemia with hemoglobin 6. She was seen by my partner Dr. EJonette Evaand plasma cell leukemia was suspected. [facility] Bone marrow biopsy was done, which confirmed plasma cell leukemia, and she started chemotherapy with Velcade, Cytoxan and dexamethasone. She received weekly dose twice, last dose on 10/20/2014. She was discharged to home afterwards. She had a mediport placed during her hospitalization.  She has moderate fatigue, low appetie, she lost about 13 lbs in the past few months. She has moderate abdominal pain, 5-6/10, persistent, she does not take any pain meds.   I reviewed her medical records extensively, and discussed her case with Dr. [place]Jorje [place].  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  [facility] and was discharged home on March 24. She was admitted to hospital last week for fever and abdominal pain, workup was negative. She was discharged home 4 days ago. Her main  complaint is fatigue, she is able to take herself at home. Her abdominal pain has resolved,  no more fever. Appetite is low, she pushes herself to eat. No significant weight loss. No other complaints.  MEDICAL HISTORY:  Past Medical History  Diagnosis Date  . GERD (gastroesophageal reflux disease)   . Hepatitis   . History of positive PPD     DX 2011--  CXR DONE NO EVIDENCE  . Hydronephrosis, right   . Right ureteral stone   . History of ureter stent   . Chills with fever     intermittently since d/c from hospital  . Dysuria-frequency syndrome     w/ pink urine  . Urosepsis 8/14    admitted to [facility]  . Pneumonia   . Neuromuscular disorder (HCC)     legs numb intermittently    SURGICAL HISTORY: Past Surgical History  Procedure Laterality Date  . Cystoscopy w/ ureteral stent placement Right 09/25/2012    Procedure: CYSTOSCOPY WITH RETROGRADE PYELOGRAM/URETERAL STENT PLACEMENT;  Surgeon: Sheryl Frock, MD;  Location: [facility];  Service: Urology;  Laterality: Right;  . Liver biopsy    . Right vats w/ drainage peural effusion and bx's  10-30-2008  . Removal of uterine cyst      years ago  . Other surgical history Right     removal of ovarian cyst  . Cystoscopy with retrograde pyelogram, ureteroscopy and stent placement Right 10/15/2012    Procedure: CYSTOSCOPY WITH RETROGRADE PYELOGRAM, URETEROSCOPY AND REMOVAL STENT WITH  STENT PLACEMENT;  Surgeon: Sheryl Frock, MD;  Location[facility];  Service: Urology;  Laterality: Right;  . Holmium laser application Right 0/93/2671    Procedure: HOLMIUM LASER APPLICATION;  Surgeon: Sheryl Frock, MD;  Location: [facility];  Service: Urology;  Laterality: Right;  . Esophagogastroduodenoscopy (egd) with propofol N/A 11/16/2014    Procedure: ESOPHAGOGASTRODUODENOSCOPY (EGD) WITH PROPOFOL;  Surgeon: Sheryl Banister, MD;  Location: [facility];  Service: Endoscopy;  Laterality: N/A;    SOCIAL HISTORY: Social History   Social History  . Marital Status: Single    Spouse Name: N/A  . Number of  Children: 2  . Years of Education: N/A   Occupational History  . She used to work at a [employer]    Social History Main Topics  . Smoking status: Never Smoker   . Smokeless tobacco: Not on file  . Alcohol Use: No  . Drug Use: No  . Sexual Activity: Not Currently    Birth Control/ Protection: Abstinence   Other Topics Concern  . Not on file   Social History Narrative    FAMILY HISTORY: Family History  Problem Relation Age of Onset  . Stomach cancer Mother   . Lung disease Father   . Asthma Father     ALLERGIES:  has No Known Allergies.  MEDICATIONS:  Current Outpatient Prescriptions  Medication Sig Dispense Refill  . acyclovir (ZOVIRAX) 400 MG tablet Take 1 tablet (400 mg total) by mouth 2 (two) times daily. 60 tablet 3  . acyclovir (ZOVIRAX) 400 MG tablet Take 400 mg by mouth daily.    . baclofen (LIORESAL) 10 MG tablet Take at bedtime to help with muscle cramps    . entecavir (BARACLUDE) 0.5 MG tablet Take 0.5 mg by mouth daily.     Marland Kitchen esomeprazole (NEXIUM) 20 MG capsule Take 20 mg by mouth daily.    Marland Kitchen lidocaine-prilocaine (EMLA) cream Apply 1 application topically as needed. (Patient taking differently: Apply 1  application topically as needed (port). ) 30 g 6  . Magnesium Cl-Calcium Carbonate (SLOW MAGNESIUM/CALCIUM) 70-117 MG TBEC Take 64 mg by mouth 2 (two) times daily.    . Multiple Vitamins-Minerals (MULTIVITAMIN ADULT PO) Take 1 tablet by mouth daily.    Marland Kitchen omeprazole (PRILOSEC) 20 MG capsule Take 1 capsule (20 mg total) by mouth daily. 30 capsule 2  . polyethylene glycol (MIRALAX / GLYCOLAX) packet Take 17 g by mouth as needed for moderate constipation. 28 each 1  . potassium chloride (MICRO-K) 10 MEQ CR capsule Take 20 mEq by mouth 2 (two) times daily.    Marland Kitchen sulfamethoxazole-trimethoprim (BACTRIM DS,SEPTRA DS) 800-160 MG tablet Take 1 tablet by mouth every Monday, Wednesday, and Friday.    . traMADol (ULTRAM) 50 MG tablet Take 1 tablet (50 mg total) by mouth every  6 (six) hours as needed. 30 tablet 0  . dexamethasone (DECADRON) 4 MG tablet Take 10 tablets (40 mg total) by mouth once a week. On the day of chemo treatment (Patient not taking: Reported on 05/16/2015) 40 tablet 2  . folic acid (FOLVITE) 1 MG tablet Take 1 tablet by mouth daily.  5  . MOZOBIL 24 MG/1.2ML SOLN   0   No current facility-administered medications for this visit.   Facility-Administered Medications Ordered in Other Visits  Medication Dose Route Frequency Provider Last Rate Last Dose  . sodium chloride flush (NS) 0.9 % injection 10 mL  10 mL Intravenous PRN Truitt Merle, MD   10 mL at 05/16/15 1139    REVIEW OF SYSTEMS:   Constitutional: (+) intermittent fevers, no chills or abnormal night sweats Eyes: Denies blurriness of vision, double vision or watery eyes Ears, nose, mouth, throat, and face: Denies mucositis or sore throat Respiratory: Denies cough, dyspnea or wheezes Cardiovascular: Denies palpitation, chest discomfort or lower extremity swelling Gastrointestinal:  Denies nausea, heartburn or change in bowel habits Skin: Denies abnormal skin rashes Lymphatics: Denies new lymphadenopathy or easy bruising Neurological:Denies numbness, tingling or new weaknesses Behavioral/Psych: Mood is stable, no new changes  All other systems were reviewed with the patient and are negative.  PHYSICAL EXAMINATION: ECOG PERFORMANCE STATUS: 1  Filed Vitals:   05/16/15 1229  BP: 96/70  Pulse: 95  Temp: 98.6 F (37 C)  Resp: 18   Filed Weights   05/16/15 1229  Weight: 125 lb 8 oz (56.926 kg)    GENERAL:alert, no distress and comfortable SKIN: skin color, texture, turgor are normal, no rashes or significant lesions EYES: normal, conjunctiva are pink and non-injected, sclera clear OROPHARYNX:no exudate, no erythema and lips, buccal mucosa, and tongue normal  NECK: supple, thyroid normal size, non-tender, without nodularity LYMPH:  no palpable lymphadenopathy in the cervical,  axillary or inguinal LUNGS: clear to auscultation and percussion with normal breathing effort HEART: regular rate & rhythm and no murmurs and no lower extremity edema ABDOMEN:abdomen soft, non-tender and normal bowel sounds Musculoskeletal:no cyanosis of digits and no clubbing  PSYCH: alert & oriented x 3 with fluent speech NEURO: no focal motor/sensory deficits  LABORATORY DATA:  I have reviewed the data as listed CBC Latest Ref Rng 05/16/2015 02/22/2015 02/15/2015  WBC 3.9 - 10.3 10e3/uL 6.1 6.0 4.7  Hemoglobin 11.6 - 15.9 g/dL 12.3 11.7 12.2  Hematocrit 34.8 - 46.6 % 37.1 36.1 38.0  Platelets 145 - 400 10e3/uL 169 191 213    CMP Latest Ref Rng 05/16/2015 02/22/2015 02/15/2015  Glucose 70 - 140 mg/dl 118 87 114  BUN 7.0 - 26.0 mg/dL  6.4(L) 8.0 7.4  Creatinine 0.6 - 1.1 mg/dL 0.6 0.7 0.8  Sodium 136 - 145 mEq/L 142 139 142  Potassium 3.5 - 5.1 mEq/L 3.3(L) 4.2 4.2  CO2 22 - 29 mEq/L _0 Calcium 8.4 - 10.4 mg/dL 9.6 9.3 9.7  Total Protein 6.4 - 8.3 g/dL 6.7 7.1 7.3  Total Bilirubin 0.20 - 1.20 mg/dL 0.34 0.46 <0.30  Alkaline Phos 40 - 150 U/L 163(H) 141 145  AST 5 - 34 U/L 38(H) 44(H) 37(H)  ALT 0 - 55 U/L <_1 SPEP M-protein  09/15/2014: 4.2 10/08/14: 4.6 12/08/2014: 0.2 02/15/2015: not sufficient sample for test    IgG mg/dl 11/03/2014: 3150  12/08/2014:  759 02/14/2014: 860  Kappa/lambda light chains levels and ration  11/03/14: 0.75, 152, 0.00 12/22/2014: 1.10, 2.60, 0.42 02/15/2015: 9.59,14.35, 0.67  24 h urine UPEP/IFE and light chain: 11/03/2014: IFE showed a monoclonal IgG heavy chain with associated lambda light chain. 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  See comment and CBC data.      COMMENT:  The bone marrow aspirate smears are  spicular, cellular and  adequate for evaluation. Atypical plasma cells comprise 57% of  the cellularity (500 cell manual differential count). The plasma  cells have enlarged nuclei with scattered binucleated forms and  irregular nuclear contour. There is diminished multilineage  hematopoiesis with adequate maturation. Blasts are not increased  in numbers (less than 1% by manual differential counts). 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 There is no overt dysplasia of the  myeloid or erythroid lineages. Megakaryocytes are quantitatively  and qualitatively unremarkable. There is no lymphocytosis.    Examination of the bone marrow core biopsy reveals a markedly  hypercellular marrow for age (95%) with near complete replacement  by sheets of plasma cells. Rare myeloid and erythroid elements  are present. Megakaryocytes are sparse and focally clustered with  hypolobated and hyperchromatic nuclei. There are no atypical  lymphoid aggregates or granulomas identified. Examination of the  bone marrow clot section reveals similar findings.    The peripheral blood demonstrates a leukocytosis comprised of  large plasma cells and normocytic anemia. Rouleaux is present.  There is a myeloid left shift with rare circulating blasts.    By flow cytometry on the peripheral blood, the abnormal plasma  cell population comprises 64% of the cellular elements and  expresses cytoplasmic CD138, CD45 and cLambda with dim expression  of CD19. The plasma cells are negative for surface CD2, CD3, CD4,  CD5, CD7, CD8, CD56, CD10, CD20, sKappa, sLambda, HLA-DR and  cKappa.    Overall, the findings demonstrate extensive involvement by plasma  cell myeloma/leukemia.     RADIOGRAPHIC STUDIES: I have personally reviewed the radiological images as listed and agreed with the findings in the report. No results  found.  ASSESSMENT & PLAN: 45 year old [place], presented with anemia and leokocytosis   1. 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 Anorexia, and fatigue  - secondary to chemotherapy and transplant - I encouraged her to take nutrition supplement and  to be physically active  4. Chronic Hepatitis B  -continue entecavir,  She will [facility]  5. Communication and Social support -She does not speak English, she is accompanied by interpreter to our cancer center -She has significant communication barrier, limited social support, and poor health literacy   6. Hypokalemia - She is taking potassium chloride 2 tablets a day, potassium 3.3 today , I suggest her to increase to 3 times a day - Follow CMP weekly  Plan -lab weekly - I'll see her back in 4 weeks  All questions were answered. The patient knows to call the clinic with any problems, questions or concerns. I spent 20 minutes counseling the patient face to face. The total time spent in the appointment was 30 minutes and more than 50% was on counseling.     Truitt Merle, MD 05/16/2015 1:26 PM

## 2015-05-16 NOTE — Patient Instructions (Signed)

## 2015-05-20 ENCOUNTER — Emergency Department (HOSPITAL_COMMUNITY): Payer: BLUE CROSS/BLUE SHIELD

## 2015-05-20 ENCOUNTER — Inpatient Hospital Stay (HOSPITAL_COMMUNITY)
Admission: EM | Admit: 2015-05-20 | Discharge: 2015-05-24 | DRG: 871 | Disposition: A | Discharge status: Home / Self Care | Payer: BLUE CROSS/BLUE SHIELD | Attending: Internal Medicine | Admitting: Internal Medicine

## 2015-05-20 ENCOUNTER — Encounter (HOSPITAL_COMMUNITY): Payer: Self-pay | Admitting: *Deleted

## 2015-05-20 DIAGNOSIS — E875 Hyperkalemia: Secondary | ICD-10-CM | POA: Diagnosis present

## 2015-05-20 DIAGNOSIS — Z9221 Personal history of antineoplastic chemotherapy: Secondary | ICD-10-CM

## 2015-05-20 DIAGNOSIS — J189 Pneumonia, unspecified organism: Secondary | ICD-10-CM | POA: Diagnosis present

## 2015-05-20 DIAGNOSIS — E876 Hypokalemia: Secondary | ICD-10-CM | POA: Diagnosis not present

## 2015-05-20 DIAGNOSIS — Z9481 Bone marrow transplant status: Secondary | ICD-10-CM | POA: Diagnosis not present

## 2015-05-20 DIAGNOSIS — C9010 Plasma cell leukemia not having achieved remission: Secondary | ICD-10-CM | POA: Diagnosis present

## 2015-05-20 DIAGNOSIS — A419 Sepsis, unspecified organism: Secondary | ICD-10-CM | POA: Diagnosis present

## 2015-05-20 DIAGNOSIS — A4151 Sepsis due to Escherichia coli [E. coli]: Secondary | ICD-10-CM | POA: Diagnosis not present

## 2015-05-20 DIAGNOSIS — K219 Gastro-esophageal reflux disease without esophagitis: Secondary | ICD-10-CM | POA: Diagnosis present

## 2015-05-20 DIAGNOSIS — B1910 Unspecified viral hepatitis B without hepatic coma: Secondary | ICD-10-CM | POA: Diagnosis present

## 2015-05-20 DIAGNOSIS — B181 Chronic viral hepatitis B without delta-agent: Secondary | ICD-10-CM | POA: Diagnosis not present

## 2015-05-20 DIAGNOSIS — D899 Disorder involving the immune mechanism, unspecified: Secondary | ICD-10-CM | POA: Diagnosis present

## 2015-05-20 DIAGNOSIS — D696 Thrombocytopenia, unspecified: Secondary | ICD-10-CM | POA: Diagnosis present

## 2015-05-20 DIAGNOSIS — Z800 Family history of malignant neoplasm of digestive organs: Secondary | ICD-10-CM | POA: Diagnosis not present

## 2015-05-20 DIAGNOSIS — Z1612 Extended spectrum beta lactamase (ESBL) resistance: Secondary | ICD-10-CM | POA: Diagnosis present

## 2015-05-20 DIAGNOSIS — Z79899 Other long term (current) drug therapy: Secondary | ICD-10-CM

## 2015-05-20 DIAGNOSIS — D649 Anemia, unspecified: Secondary | ICD-10-CM | POA: Diagnosis present

## 2015-05-20 DIAGNOSIS — N10 Acute tubulo-interstitial nephritis: Secondary | ICD-10-CM | POA: Diagnosis present

## 2015-05-20 DIAGNOSIS — N3000 Acute cystitis without hematuria: Secondary | ICD-10-CM | POA: Diagnosis not present

## 2015-05-20 DIAGNOSIS — G43A0 Cyclical vomiting, not intractable: Secondary | ICD-10-CM | POA: Diagnosis not present

## 2015-05-20 DIAGNOSIS — R112 Nausea with vomiting, unspecified: Secondary | ICD-10-CM | POA: Diagnosis present

## 2015-05-20 DIAGNOSIS — D849 Immunodeficiency, unspecified: Secondary | ICD-10-CM | POA: Insufficient documentation

## 2015-05-20 DIAGNOSIS — N200 Calculus of kidney: Secondary | ICD-10-CM | POA: Diagnosis present

## 2015-05-20 DIAGNOSIS — N390 Urinary tract infection, site not specified: Secondary | ICD-10-CM | POA: Diagnosis present

## 2015-05-20 DIAGNOSIS — Z825 Family history of asthma and other chronic lower respiratory diseases: Secondary | ICD-10-CM

## 2015-05-20 DIAGNOSIS — Z87442 Personal history of urinary calculi: Secondary | ICD-10-CM | POA: Diagnosis not present

## 2015-05-20 LAB — URINALYSIS, ROUTINE W REFLEX MICROSCOPIC
Bilirubin Urine: NEGATIVE
GLUCOSE, UA: NEGATIVE mg/dL
Ketones, ur: NEGATIVE mg/dL
Nitrite: POSITIVE — AB
PH: 6.5 (ref 5.0–8.0)
PROTEIN: 100 mg/dL — AB
SPECIFIC GRAVITY, URINE: 1.02 (ref 1.005–1.030)

## 2015-05-20 LAB — CBC WITH DIFFERENTIAL/PLATELET
BASOS PCT: 0 %
Basophils Absolute: 0 10*3/uL (ref 0.0–0.1)
EOS ABS: 0 10*3/uL (ref 0.0–0.7)
Eosinophils Relative: 0 %
HEMATOCRIT: 36.8 % (ref 36.0–46.0)
HEMOGLOBIN: 12.8 g/dL (ref 12.0–15.0)
LYMPHS ABS: 2.8 10*3/uL (ref 0.7–4.0)
Lymphocytes Relative: 11 %
MCH: 29.4 pg (ref 26.0–34.0)
MCHC: 34.8 g/dL (ref 30.0–36.0)
MCV: 84.4 fL (ref 78.0–100.0)
Monocytes Absolute: 2.5 10*3/uL — ABNORMAL HIGH (ref 0.1–1.0)
Monocytes Relative: 10 %
NEUTROS ABS: 19 10*3/uL — AB (ref 1.7–7.7)
NEUTROS PCT: 78 %
Platelets: 190 10*3/uL (ref 150–400)
RBC: 4.36 MIL/uL (ref 3.87–5.11)
RDW: 18.2 % — ABNORMAL HIGH (ref 11.5–15.5)
WBC: 24.3 10*3/uL — AB (ref 4.0–10.5)

## 2015-05-20 LAB — I-STAT CHEM 8, ED
BUN: 18 mg/dL (ref 6–20)
Calcium, Ion: 1.08 mmol/L — ABNORMAL LOW (ref 1.12–1.23)
Chloride: 99 mmol/L — ABNORMAL LOW (ref 101–111)
Creatinine, Ser: 0.8 mg/dL (ref 0.44–1.00)
Glucose, Bld: 113 mg/dL — ABNORMAL HIGH (ref 65–99)
HEMATOCRIT: 48 % — AB (ref 36.0–46.0)
HEMOGLOBIN: 16.3 g/dL — AB (ref 12.0–15.0)
POTASSIUM: 5.3 mmol/L — AB (ref 3.5–5.1)
SODIUM: 134 mmol/L — AB (ref 135–145)
TCO2: 24 mmol/L (ref 0–100)

## 2015-05-20 LAB — I-STAT CG4 LACTIC ACID, ED
Lactic Acid, Venous: 2.04 mmol/L (ref 0.5–2.0)
Lactic Acid, Venous: 0.66 mmol/L (ref 0.5–2.0)

## 2015-05-20 LAB — LIPASE, BLOOD: LIPASE: 15 U/L (ref 11–51)

## 2015-05-20 LAB — URINE MICROSCOPIC-ADD ON

## 2015-05-20 LAB — I-STAT TROPONIN, ED: TROPONIN I, POC: 0 ng/mL (ref 0.00–0.08)

## 2015-05-20 LAB — I-STAT BETA HCG BLOOD, ED (MC, WL, AP ONLY)

## 2015-05-20 MED ORDER — PANTOPRAZOLE SODIUM 40 MG IV SOLR
40.0000 mg | INTRAVENOUS | Status: DC
  Administered 2015-05-21 – 2015-05-22 (×3): 40 mg via INTRAVENOUS
  Filled 2015-05-20 (×4): qty 40

## 2015-05-20 MED ORDER — ENTECAVIR 0.5 MG PO TABS: 0.5000 mg | ORAL_TABLET | Freq: Every day | ORAL | Status: DC

## 2015-05-20 MED ORDER — ENOXAPARIN SODIUM 40 MG/0.4ML ~~LOC~~ SOLN
40.0000 mg | Freq: Every day | SUBCUTANEOUS | Status: DC
  Administered 2015-05-21 (×2): 40 mg via SUBCUTANEOUS
  Filled 2015-05-20 (×2): qty 0.4

## 2015-05-20 MED ORDER — DEXTROSE-NACL 5-0.45 % IV SOLN
INTRAVENOUS | Status: DC
  Administered 2015-05-20: 23:00:00 via INTRAVENOUS

## 2015-05-20 MED ORDER — TRAMADOL HCL 50 MG PO TABS: 50.0000 mg | ORAL_TABLET | Freq: Four times a day (QID) | ORAL | Status: DC | PRN

## 2015-05-20 MED ORDER — SODIUM CHLORIDE 0.9 % IV BOLUS (SEPSIS)
1000.0000 mL | Freq: Once | INTRAVENOUS | Status: AC
  Administered 2015-05-20: 1000 mL via INTRAVENOUS

## 2015-05-20 MED ORDER — ONDANSETRON HCL 4 MG/2ML IJ SOLN
4.0000 mg | Freq: Four times a day (QID) | INTRAMUSCULAR | Status: DC | PRN
  Administered 2015-05-20 – 2015-05-23 (×7): 4 mg via INTRAVENOUS
  Filled 2015-05-20 (×7): qty 2

## 2015-05-20 MED ORDER — MORPHINE SULFATE (PF) 2 MG/ML IV SOLN: 2.0000 mg | INTRAVENOUS | Status: DC | PRN

## 2015-05-20 MED ORDER — ONDANSETRON HCL 4 MG/2ML IJ SOLN
4.0000 mg | Freq: Once | INTRAMUSCULAR | Status: AC
  Administered 2015-05-20: 4 mg via INTRAVENOUS
  Filled 2015-05-20: qty 2

## 2015-05-20 MED ORDER — SULFAMETHOXAZOLE-TRIMETHOPRIM 800-160 MG PO TABS
1.0000 | ORAL_TABLET | ORAL | Status: DC
  Administered 2015-05-23: 1 via ORAL
  Filled 2015-05-20 (×2): qty 1

## 2015-05-20 MED ORDER — PIPERACILLIN-TAZOBACTAM 3.375 G IVPB 30 MIN
3.3750 g | Freq: Once | INTRAVENOUS | Status: AC
  Administered 2015-05-20: 3.375 g via INTRAVENOUS
  Filled 2015-05-20: qty 50

## 2015-05-20 MED ORDER — IOPAMIDOL (ISOVUE-300) INJECTION 61%
100.0000 mL | Freq: Once | INTRAVENOUS | Status: AC | PRN
  Administered 2015-05-20: 100 mL via INTRAVENOUS

## 2015-05-20 MED ORDER — PIPERACILLIN-TAZOBACTAM 3.375 G IVPB
3.3750 g | Freq: Three times a day (TID) | INTRAVENOUS | Status: DC
  Administered 2015-05-21 – 2015-05-24 (×11): 3.375 g via INTRAVENOUS
  Filled 2015-05-20 (×12): qty 50

## 2015-05-20 MED ORDER — PROCHLORPERAZINE EDISYLATE 5 MG/ML IJ SOLN
5.0000 mg | Freq: Four times a day (QID) | INTRAMUSCULAR | Status: DC | PRN
  Administered 2015-05-21 (×4): 5 mg via INTRAVENOUS
  Filled 2015-05-20 (×4): qty 2

## 2015-05-20 MED ORDER — ACETAMINOPHEN 650 MG RE SUPP
650.0000 mg | Freq: Once | RECTAL | Status: AC
  Administered 2015-05-20: 650 mg via RECTAL
  Filled 2015-05-20: qty 1

## 2015-05-20 MED ORDER — VANCOMYCIN HCL IN DEXTROSE 750-5 MG/150ML-% IV SOLN
750.0000 mg | Freq: Two times a day (BID) | INTRAVENOUS | Status: DC
  Administered 2015-05-21 – 2015-05-22 (×3): 750 mg via INTRAVENOUS
  Filled 2015-05-20 (×4): qty 150

## 2015-05-20 MED ORDER — ONDANSETRON HCL 4 MG PO TABS: 4.0000 mg | ORAL_TABLET | Freq: Four times a day (QID) | ORAL | Status: DC | PRN

## 2015-05-20 MED ORDER — PLERIXAFOR 24 MG/1.2ML ~~LOC~~ SOLN: 24.0000 mg | Freq: Every day | SUBCUTANEOUS | Status: DC

## 2015-05-20 MED ORDER — ADULT MULTIVITAMIN W/MINERALS CH
1.0000 | ORAL_TABLET | Freq: Every day | ORAL | Status: DC
  Administered 2015-05-22 – 2015-05-24 (×3): 1 via ORAL
  Filled 2015-05-20 (×3): qty 1

## 2015-05-20 MED ORDER — VANCOMYCIN HCL IN DEXTROSE 1-5 GM/200ML-% IV SOLN
1000.0000 mg | Freq: Once | INTRAVENOUS | Status: AC
  Administered 2015-05-20: 1000 mg via INTRAVENOUS
  Filled 2015-05-20: qty 200

## 2015-05-20 MED ORDER — LIDOCAINE-PRILOCAINE 2.5-2.5 % EX CREA: 1.0000 "application " | TOPICAL_CREAM | CUTANEOUS | Status: DC | PRN

## 2015-05-20 MED ORDER — ACYCLOVIR 800 MG PO TABS
800.0000 mg | ORAL_TABLET | Freq: Every day | ORAL | Status: DC
  Administered 2015-05-22 – 2015-05-24 (×3): 800 mg via ORAL
  Filled 2015-05-20 (×4): qty 1

## 2015-05-20 NOTE — Progress Notes (Addendum)
Pharmacy Antibiotic Note  Anahita Vanzanten is a 45 y.o. female admitted on 05/20/2015 with sepsis.  She is currently being treated for leukemia and reports daughter that she lives with had flu last week. Pharmacy has been consulted for Vancomycin & Zosyn dosing.  05/20/2015:  Tm 103.1  Leukocytosis (WBC 24.3, ANC 19.0)  Lactic acid elevated (2.04)  Scr at baseline.  Estimated CrCl ~ 35ml/min.  Plan: Vancomycin 750 IV every 12 hours.  Goal trough 15-20 mcg/mL. Zosyn 3.375g IV q8h (4 hour infusion).  Check Vancomycin trough at steady state Monitor renal function and cx data      Temp (24hrs), Avg:103.1 F (39.5 C), Min:103.1 F (39.5 C), Max:103.1 F (39.5 C)   Recent Labs Lab 05/16/15 1124 05/16/15 1124  WBC 6.1  --   CREATININE  --  0.6    Estimated Creatinine Clearance: 72.7 mL/min (by C-G formula based on Cr of 0.6).    No Known Allergies  Antimicrobials this admission: Zosyn 4/16 >>  Vancomycin 4/16 >>   Dose adjustments this admission:  Microbiology results: 4/16 BCx: sent 4/16 UCx: ordered   Thank you for allowing pharmacy to be a part of this patient's care.  Netta Cedars, PharmD, BCPS Pager: [phone] 05/20/2015 5:27 PM

## 2015-05-20 NOTE — ED Notes (Signed)
Pt's sister reports pt has been vomiting and weak since yesterday.  Pt has leukemia.  Sister reports her daughter had the flu last week and pt lives with them.  Pt is actively vomiting.

## 2015-05-20 NOTE — ED Notes (Signed)
Lactic Acid = 2.04, MD Viviana Simpler notified

## 2015-05-20 NOTE — H&P (Signed)
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 She came with cc of nausea/vomiting and fever. She was admitted at [facility] last week for similar complaint that resolved by discharge. Symptoms returned after discharge and has been worse for the past 2-3 days. She has mild abdominal pain. No chest pain or dyspnea or cough. No dysuria. No wounds or ulcers. She has a port that was placed back in Sep. She feels weak and fatigue and had sweating. No diarrhea. No other complaints. History was taken from ER physician through interpreter and from patient with her family help as she speaks almost no [place]. She was seen in Oncology clinic 4 days ago and was complaining of fatigue and poor appetite with no fever or vomiting.   Review of Systems:  CONSTITUTIONAL:     +night sweats.  +fatigue.  +fever. +chills. Eyes:                            No visual changes.  No eye pain.  No eye discharge.   ENT:                              No epistaxis.  No sinus pain.  No sore throat.   No congestion. RESPIRATORY:           No cough.  No wheeze.  No hemoptysis.  No dyspnea CARDIOVASCULAR   :  No chest pains.  No palpitations. GASTROINTESTINAL:  +abdominal pain.  +nausea. +vomiting.  No diarrhea. Noconstipation.  No hematemesis.  No hematochezia.  No melena. GENITOURINARY:      No urgency.  No frequency.  No dysuria.  No hematuria.  Nobstructive symptoms.  No discharge.  No pain.  MUSCULOSKELETAL:  No musculoskeletal pain.  No joint swelling.  No arthritis. NEUROLOGICAL:        No confusion.  No weakness. No headache. No seizure. PSYCHIATRIC:             No depression. No anxiety. No suicidal ideation. SKIN:                             No rashes.  No lesions.  No  wounds. ENDOCRINE:                No weight loss.  No polydipsia.  No polyuria.  No polyphagia. HEMATOLOGIC:           No purpura.  No petechiae.  No bleeding.  ALLERGIC                 : No pruritus.  No angioedema Other:  Past Medical and Surgical History:   Past Medical History  Diagnosis Date  . GERD (gastroesophageal reflux disease)   . Hepatitis   . History of positive PPD     DX 2011--  CXR DONE NO EVIDENCE  . Hydronephrosis, right   . Right ureteral stone   . History of ureter stent   . Chills with fever     intermittently since d/c from hospital  . Dysuria-frequency syndrome     w/ pink  urine  . Urosepsis 8/14    admitted to [facility]  . Pneumonia   . Neuromuscular disorder (HCC)     legs numb intermittently   Past Surgical History  Procedure Laterality Date  . Cystoscopy w/ ureteral stent placement Right 09/25/2012    Procedure: CYSTOSCOPY WITH RETROGRADE PYELOGRAM/URETERAL STENT PLACEMENT;  Surgeon: Alexis Frock, MD;  Location: [facility];  Service: Urology;  Laterality: Right;  . Liver biopsy    . Right vats w/ drainage peural effusion and bx's  10-30-2008  . Removal of uterine cyst      years ago  . Other surgical history Right     removal of ovarian cyst  . Cystoscopy with retrograde pyelogram, ureteroscopy and stent placement Right 10/15/2012    Procedure: CYSTOSCOPY WITH RETROGRADE PYELOGRAM, URETEROSCOPY AND REMOVAL STENT WITH  STENT PLACEMENT;  Surgeon: Alexis Frock, MD;  Location: [facility];  Service: Urology;  Laterality: Right;  . Holmium laser application Right [id number]    Procedure: HOLMIUM LASER APPLICATION;  Surgeon: Alexis Frock, MD;  Location: [facility];  Service: Urology;  Laterality: Right;  . Esophagogastroduodenoscopy (egd) with propofol N/A 11/16/2014    Procedure: ESOPHAGOGASTRODUODENOSCOPY (EGD) WITH PROPOFOL;  Surgeon: Milus Banister, MD;  Location: [facility];  Service: Endoscopy;  Laterality: N/A;     Social History:   reports that she has never smoked. She has never used smokeless tobacco. She reports that she does not drink alcohol or use illicit drugs.    No Known Allergies  Family History  Problem Relation Age of Onset  . Stomach cancer Mother   . Lung disease Father   . Asthma Father       Prior to Admission medications   Medication Sig Start Date End Date Taking? Authorizing Provider  acyclovir (ZOVIRAX) 800 MG tablet Take 800 mg by mouth daily.  04/27/15  Yes Historical Provider, MD  entecavir (BARACLUDE) 0.5 MG tablet Take 0.5 mg by mouth daily.  10/20/14  Yes Historical Provider, MD  Magnesium Cl-Calcium Carbonate (SLOW MAGNESIUM/CALCIUM) 70-117 MG TBEC Take 64 mg by mouth 2 (two) times daily. 04/27/15  Yes Historical Provider, MD  MOZOBIL 24 MG/1.2ML SOLN Take 24 mg by mouth daily.  03/15/15  Yes Historical Provider, MD  Multiple Vitamins-Minerals (MULTIVITAMIN ADULT PO) Take 1 tablet by mouth daily. 04/13/15 10/10/15 Yes Historical Provider, MD  potassium chloride (MICRO-K) 10 MEQ CR capsule Take 20 mEq by mouth 2 (two) times daily. 04/27/15  Yes Historical Provider, MD  traMADol (ULTRAM) 50 MG tablet Take 1 tablet (50 mg total) by mouth every 6 (six) hours as needed. 02/15/15  Yes Truitt Merle, MD  acyclovir (ZOVIRAX) 400 MG tablet Take 1 tablet (400 mg total) by mouth 2 (two) times daily. Patient not taking: Reported on 05/20/2015 10/24/14   Truitt Merle, MD  dexamethasone (DECADRON) 4 MG tablet Take 10 tablets (40 mg total) by mouth once a week. On the day of chemo treatment Patient not taking: Reported on 05/16/2015 11/17/14   Truitt Merle, MD  lidocaine-prilocaine (EMLA) cream Apply 1 application topically as needed. Patient taking differently: Apply 1 application topically as needed (port).  10/24/14   Truitt Merle, MD  omeprazole (PRILOSEC) 20 MG capsule Take 1 capsule (20 mg total) by mouth daily. Patient not taking: Reported on 05/20/2015 01/12/15   Truitt Merle, MD  polyethylene glycol  [facility] / Floria Raveling) packet Take 17 g by mouth as needed for moderate constipation. Patient not taking: Reported on 05/20/2015 01/12/15  Truitt Merle, MD  sulfamethoxazole-trimethoprim (BACTRIM DS,SEPTRA DS) 800-160 MG tablet Take 1 tablet by mouth every Monday, Wednesday, and Friday. 05/14/15   Historical Provider, MD    Physical Exam: BP 102/68 mmHg  Pulse 115  Temp(Src) 103.1 F (39.5 C) (Oral)  Resp 12  SpO2 100%  GENERAL :   Alert and cooperative, and appears to be in mild acute distress. HEAD:           normocephalic. EYES:            PERRL, EOMI.  vision is grossly intact. EARS:           hearing grossly intact. NOSE:           No nasal discharge. THROAT:     Oral cavity and pharynx normal.   NECK:          supple, non-tender.  CARDIAC:    Normal S1 and S2. No gallop. No murmurs.  Vascular:     no peripheral edema LUNGS:       Clear to auscultation  ABDOMEN: Positive bowel sounds. Soft, nondistended, nontender.     MSK:           No joint erythema or tenderness.  EXT           : No significant deformity or joint abnormality. Neuro        : Alert, oriented to person, place, and time.                      CN II-XII intact.                       Strength and sensation symmetric and intact throughout.  SKIN:            No rash. No lesions. Site of port looks clean PSYCH:       No hallucination. Patient is not suicidal.          Labs on Admission:  Reviewed.   Radiological Exams on Admission: Dg Chest 2 View  05/20/2015  CLINICAL DATA:  Verify Port-A-Cath placement EXAM: CHEST  2 VIEW COMPARISON:  12/01/2014 chest radiograph. FINDINGS: Right internal jugular Port-A-Cath terminates at the cavoatrial junction. Stable cardiomediastinal silhouette with normal heart size. No pneumothorax. No pleural effusion. Lungs appear clear, with no acute consolidative airspace disease and no pulmonary edema. Stable benign bone island in the right inferior scapula as seen 10/23/2008 chest CT.  IMPRESSION: No active cardiopulmonary disease. Right internal jugular Port-A-Cath terminates at the cavoatrial junction. Electronically Signed   By: Ilona Sorrel M.D.   On: 05/20/2015 17:42      Assessment/Plan  Fever :  Patient likely has sepsis Possible source is urine. Ucx pending. Bcx pending. Port looks clean. No wounds. CXR neg. CT abd unremarkable except for thickening of gallbladder and bladder walls. Start vanc/zosyn Continue acyclovir and bactrim Extend coverage to antiviral tx dose and antifungal if fever persists and cx are neg Onc consult in am  Vomiting: Could be due to above / UTI Gallbladder wall thickening but no RUQ tenderness. No bile duct dilation. May check US abdomen tmw. Zofran prn IVF Keep NPO until vomiting resolves then advance diet as tolerated Morphine prn for pain PPI IV daily  Acute plasma cell leukemia:  In remission S/p chemo and autologus BMT in March Continue acyclovir adn bactrim  Fatigue: due to above  Chronic HBV: continue entecavir  Hyperkalemia: dc Kcl. Continue to monitor  Dr.Rodriguez her oncologist is available to help at [phone]  DVT prophylaxis: [place] enoxaparin  GI prophylaxis: PPI Consultants: Onc in am.  Code Status: Full    Gennaro Africa M.D [facility]

## 2015-05-20 NOTE — ED Provider Notes (Signed)
CSN: [id number]     Arrival date & time 05/20/15  1633 History   First MD Initiated Contact with Patient 05/20/15 1659     Chief Complaint  Patient presents with  . Emesis  . Fever     (Consider location/radiation/quality/duration/timing/severity/associated sxs/prior Treatment) HPI  Patient is [place] speaking, and history is obtained via [place] speaking interpreter. 45 year old female who presents with nausea, vomiting and fever. 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 Was admitted at [facility] one week ago for management of fever and vomiting. They were told that they did not have source of her fever at that time. States that since discharge over this past week she has been having intermittent fevers at home with nausea and vomiting. However, over the past day she had acute worsening of her vomiting. Also having upper abdominal pain but no diarrhea or constipation. No dysuria or urinary frequency. Mild nonproductive cough and congestion. No known sick contacts.  Past Medical History  Diagnosis Date  . GERD (gastroesophageal reflux disease)   . Hepatitis   . History of positive PPD     DX 2011--  CXR DONE NO EVIDENCE  . Hydronephrosis, right   . Right ureteral stone   . History of ureter stent   . Chills with fever     intermittently since d/c from hospital  . Dysuria-frequency syndrome     w/ pink urine  . Urosepsis 8/14    [facility]mitted to [facility]  . Pneumonia   . Neuromuscular disorder (HCC)     legs numb intermittently   Past Surgical History  Procedure Laterality Date  . Cystoscopy w/ ureteral stent placement Right 09/25/2012    Procedure: CYSTOSCOPY WITH RETROGRADE PYELOGRAM/URETERAL STENT PLACEMENT;  Surgeon: Alexis Frock, MD;  Location: [facility];  Service: Urology;  Laterality: Right;  . Liver biopsy    . Right vats w/ drainage peural effusion and  bx's  10-30-2008  . Removal of uterine cyst      years ago  . Other surgical history Right     removal of ovarian cyst  . Cystoscopy with retrograde pyelogram, ureteroscopy and stent placement Right 10/15/2012    Procedure: CYSTOSCOPY WITH RETROGRADE PYELOGRAM, URETEROSCOPY AND REMOVAL STENT WITH  STENT PLACEMENT;  Surgeon: Alexis Frock, MD;  Location: [facility];  Service: Urology;  Laterality: Right;  . Holmium laser application Right 1/44/3154    Procedure: HOLMIUM LASER APPLICATION;  Surgeon: Alexis Frock, MD;  Location: [facility];  Service: Urology;  Laterality: Right;  . Esophagogastroduodenoscopy (egd) with propofol N/A 11/16/2014    Procedure: ESOPHAGOGASTRODUODENOSCOPY (EGD) WITH PROPOFOL;  Surgeon: Milus Banister, MD;  [facility];  Service: Endoscopy;  Laterality: N/A;   Family History  Problem Relation Age of Onset  . Stomach cancer Mother   . Lung disease Father   . Asthma Father    Social History  Substance Use Topics  . Smoking status: Never Smoker   . Smokeless tobacco: Never Used  . Alcohol Use: No   OB History    No data available     Review of Systems 10/14 systems reviewed and are negative other than those stated in the HPI    Allergies  Review of patient's allergies indicates no known allergies.  Home Medications   Prior to Admission medications   Medication Sig Start Date End Date Taking? Authorizing Provider  acyclovir (ZOVIRAX)  800 MG tablet Take 800 mg by mouth daily.  04/27/15  Yes Historical Provider, MD  entecavir (BARACLUDE) 0.5 MG tablet Take 0.5 mg by mouth daily.  10/20/14  Yes Historical Provider, MD  Magnesium Cl-Calcium Carbonate (SLOW MAGNESIUM/CALCIUM) 70-117 MG TBEC Take 64 mg by mouth 2 (two) times daily. 04/27/15  Yes Historical Provider, MD  MOZOBIL 24 MG/1.2ML SOLN Take 24 mg by mouth daily.  03/15/15  Yes Historical Provider, MD  Multiple Vitamins-Minerals (MULTIVITAMIN ADULT PO) Take 1  tablet by mouth daily. 04/13/15 10/10/15 Yes Historical Provider, MD  potassium chloride (MICRO-K) 10 MEQ CR capsule Take 20 mEq by mouth 2 (two) times daily. 04/27/15  Yes Historical Provider, MD  traMADol (ULTRAM) 50 MG tablet Take 1 tablet (50 mg total) by mouth every 6 (six) hours as needed. 02/15/15  Yes Truitt Merle, MD  acyclovir (ZOVIRAX) 400 MG tablet Take 1 tablet (400 mg total) by mouth 2 (two) times daily. Patient not taking: Reported on 05/20/2015 10/24/14   Truitt Merle, MD  dexamethasone (DECADRON) 4 MG tablet Take 10 tablets (40 mg total) by mouth once a week. On the day of chemo treatment Patient not taking: Reported on 05/16/2015 11/17/14   Truitt Merle, MD  lidocaine-prilocaine (EMLA) cream Apply 1 application topically as needed. Patient taking differently: Apply 1 application topically as needed (port).  10/24/14   Truitt Merle, MD  omeprazole (PRILOSEC) 20 MG capsule Take 1 capsule (20 mg total) by mouth daily. Patient not taking: Reported on 05/20/2015 01/12/15   Truitt Merle, MD  polyethylene glycol [facility] / Floria Raveling) packet Take 17 g by mouth as needed for moderate constipation. Patient not taking: Reported on 05/20/2015 01/12/15   Truitt Merle, MD  sulfamethoxazole-trimethoprim (BACTRIM DS,SEPTRA DS) 800-160 MG tablet Take 1 tablet by mouth every Monday, Wednesday, and Friday. 05/14/15   Historical Provider, MD   BP 95/71 mmHg  Pulse 113  Temp(Src) 103.1 F (39.5 C) (Oral)  Resp 22  SpO2 98% Physical Exam  Physical Exam  Nursing note and vitals reviewed. Constitutional: chronically and acute ill appearing, non-toxic, and in no acute distress Head: Normocephalic and atraumatic.  Mouth/Throat: Oropharynx is clear. Mucous membranes appear dry.  Neck: Normal range of motion. Neck supple. No mengingismus Cardiovascular: Tachycadic rate and regular rhythm.  no edema Pulmonary/Chest: Tachypnea, speaks in full sentences, and breath sounds normal.  Abdominal: Soft. There is upper abdominal tenderness.  There is no rebound and no guarding.  Musculoskeletal: Normal range of motion.  Neurological: Alert, no facial droop, fluent speech, moves all extremities symmetrically Skin: Skin is warm and dry.  Psychiatric: Cooperative   ED Course  Procedures (including critical care time) Labs Review Labs Reviewed  URINALYSIS, ROUTINE W REFLEX MICROSCOPIC (NOT AT [facility]) - Abnormal; Notable for the following:    Color, Urine AMBER (*)    APPearance TURBID (*)    Hgb urine dipstick MODERATE (*)    Protein, ur 100 (*)    Nitrite POSITIVE (*)    Leukocytes, UA LARGE (*)    All other components within normal limits  CBC WITH DIFFERENTIAL/PLATELET - Abnormal; Notable for the following:    WBC 24.3 (*)    RDW 18.2 (*)    Neutro Abs 19.0 (*)    Monocytes Absolute 2.5 (*)    All other components within normal limits  URINE MICROSCOPIC-ADD ON - Abnormal; Notable for the following:    Squamous Epithelial / LPF 6-30 (*)    Bacteria, UA MANY (*)    All  other components within normal limits  I-STAT CG4 LACTIC ACID, ED - Abnormal; Notable for the following:    Lactic Acid, Venous 2.04 (*)    All other components within normal limits  I-STAT CHEM 8, ED - Abnormal; Notable for the following:    Sodium 134 (*)    Potassium 5.3 (*)    Chloride 99 (*)    Glucose, Bld 113 (*)    Calcium, Ion 1.08 (*)    Hemoglobin 16.3 (*)    HCT 48.0 (*)    All other components within normal limits  CULTURE, BLOOD (ROUTINE X 2)  CULTURE, BLOOD (ROUTINE X 2)  URINE CULTURE  MRSA PCR SCREENING  LIPASE, BLOOD  INFLUENZA PANEL BY PCR (TYPE A & B, H1N1)  IGG, IGA, IGM  COMPREHENSIVE METABOLIC PANEL  MAGNESIUM  PHOSPHORUS  CBC WITH DIFFERENTIAL/PLATELET  PROTIME-INR  APTT  I-STAT TROPOININ, ED  I-STAT BETA HCG BLOOD, ED ([facility], [facility], [facility] ONLY)  I-STAT CG4 LACTIC ACID, ED    Imaging Review Dg Chest 2 View  05/20/2015  CLINICAL DATA:  Verify Port-A-Cath placement EXAM: CHEST  2 VIEW COMPARISON:  12/01/2014 chest  radiograph. FINDINGS: Right internal jugular Port-A-Cath terminates at the cavoatrial junction. Stable cardiomediastinal silhouette with normal heart size. No pneumothorax. No pleural effusion. Lungs appear clear, with no acute consolidative airspace disease and no pulmonary edema. Stable benign bone island in the right inferior scapula as seen 10/23/2008 chest CT. IMPRESSION: No active cardiopulmonary disease. Right internal jugular Port-A-Cath terminates at the cavoatrial junction. Electronically Signed   By: Ilona Sorrel M.D.   On: 05/20/2015 17:42   Ct Abdomen Pelvis W Contrast  05/20/2015  CLINICAL DATA:  Vomiting and weakness since yesterday. History of leukemia. EXAM: CT ABDOMEN AND PELVIS WITH CONTRAST TECHNIQUE: Multidetector CT imaging of the abdomen and pelvis was performed using the standard protocol following bolus administration of intravenous contrast. CONTRAST:  146m ISOVUE-300 IOPAMIDOL (ISOVUE-300) INJECTION 61% COMPARISON:  09/14/2014 FINDINGS: Sub cm low-attenuation lesion in the right lobe of the liver is unchanged since previous study. Too small to characterize but likely represents a cyst. No new liver lesions identified. There is mild thickening of the gallbladder wall without stone or infiltration demonstrated. Gallbladder inflammation is not excluded. No bile duct dilatation. Pancreas is unremarkable. Spleen size is normal. There is a mass extending lateral and posterior to the spleen which appears to be a septated cystic lesion. The lesion measures up to about 2 x 8 cm. Appearance is unchanged since previous study. Adrenal glands are unremarkable. Multiple parenchymal cysts in the kidneys. No hydronephrosis. 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 No change. Normal caliber aorta and inferior vena cava. No significant retroperitoneal lymphadenopathy. Stomach, small bowel, and colon are not abnormally distended. No free air or free fluid in the  abdomen. Pelvis: Mild diffuse thickening of bladder wall may indicate cystitis. Heterogeneous appearance of the uterus clear representing fibroids. No abnormal adnexal masses. No free fluid in the pelvis. Appendix is not definitively identified. Soft tissue calcifications over the gluteal regions likely representing dystrophic or injection granulomas. Benign-appearing sclerosis in the right acetabulum and right sacrum. No destructive bone lesions. Lucencies over the upper iliac bones likely representing bone marrow biopsy sites. IMPRESSION: Suggestion of mild thickening of the gallbladder wall and bladder possibly inflammatory. No other potential acute changes since previous study. Nonobstructing stones in the kidneys. Multiloculated cystic appearing mass in the left upper quadrant adjacent to the spleen is  unchanged. Probable uterine fibroids. No findings to suggest bowel obstruction or inflammation. Electronically Signed   By: Lucienne Capers M.D.   On: 05/20/2015 21:38   I have personally reviewed and evaluated these images and lab results as part of my medical decision-making.   EKG Interpretation   Date/Time:  Sunday May 20 2015 17:08:36 EDT Ventricular Rate:  127 PR Interval:  135 QRS Duration: 87 QT Interval:  321 QTC Calculation: 467 R Axis:   121 Text Interpretation:  Sinus tachycardia Consider RVH w/ secondary repol  abnormality ST depression and t wave inversions in naterior leads new   Confirmed by Saloma Cadena MD, Braiden Presutti [phone]) on 05/20/2015 5:27:44 PM       CRITICAL CARE Performed by: Forde Dandy   Total critical care time: 35 minutes  Critical care time was exclusive of separately billable procedures and treating other patients.  Critical care was necessary to treat or prevent imminent or life-threatening deterioration.  Critical care was time spent personally by me on the following activities: development of treatment plan with patient and/or surrogate as well as nursing,  discussions with consultants, evaluation of patient's response to treatment, examination of patient, obtaining history from patient or surrogate, ordering and performing treatments and interventions, ordering and review of laboratory studies, ordering and review of radiographic studies, pulse oximetry and re-evaluation of patient's condition.  MDM   Final diagnoses:  Sepsis, due to unspecified organism ([place])  Acute pyelonephritis  Plasma cell leukemia not having achieved remission ([place])  Immunosuppressed status ([place])  S/P bone marrow transplant ([place])    45 year old female with history of acute plasma cell leukemia status post chemotherapy and bone marrow transplant who presents with fever, nausea, and vomiting. On presentation, is febrile, tachycardic, normotensive. She has tachypnea but normal oxygenation speaks in full sentences. With a soft and benign abdomen and upper abdominal tenderness. Aside from tachycardia and tachypnea, unremarkable cardiopulmonary exam.  code sepsis activated, and septic workup is pursued  including blood cultures, urine cultures, chest x-ray, and basic blood work.   She has leukocytosis of 24 and initial lactate of 2.04. No evidence of end organ damage. EKG was some T-wave inversions and depressions in the anterior leads, which is new, the troponin is negative and is felt to be more rate related given her significant tachycardia. UA is suggestive of a urinary tract infection, which is her likely source of sepsis. She is empirically started on vancomycin and Zosyn. she received 30 mL/kg bolus of IV fluids with improvement in her tachycardia. Also perform CT abdomen pelvis I given her abdominal pain, and this is pending.   Discussed with Dr. Norma Fredrickson who is patient's hematologist at [facility]. The patient is stable for admission at [facility] obtaining quantitative immunoglobulins and IgG less than 400, start IVIG. He will  continue to consult over phone as needed.   This patient was subsequently discussed with Dr. Dreama Saa from hospitalist service. He will admit to stepdown for ongoing management.   Forde Dandy, MD 05/21/15 [id number]

## 2015-05-20 NOTE — ED Notes (Signed)
Floor notified of 20 min timer started

## 2015-05-20 NOTE — ED Notes (Signed)
Patient's porta cath was put in at [facility]. Chest x-ray ordered to check for placement. Writer asked patient if a peripheral line could be started so treatment could be started. Patient and patient's family stated "no" because it would be hard.

## 2015-05-20 NOTE — ED Notes (Signed)
Nurse to access port and draw labs

## 2015-05-21 ENCOUNTER — Inpatient Hospital Stay (HOSPITAL_COMMUNITY): Payer: BLUE CROSS/BLUE SHIELD

## 2015-05-21 DIAGNOSIS — N390 Urinary tract infection, site not specified: Secondary | ICD-10-CM | POA: Diagnosis present

## 2015-05-21 DIAGNOSIS — R112 Nausea with vomiting, unspecified: Secondary | ICD-10-CM

## 2015-05-21 LAB — CBC WITH DIFFERENTIAL/PLATELET
BASOS PCT: 0 %
Basophils Absolute: 0 10*3/uL (ref 0.0–0.1)
EOS ABS: 0 10*3/uL (ref 0.0–0.7)
Eosinophils Relative: 0 %
HCT: 35.4 % — ABNORMAL LOW (ref 36.0–46.0)
HEMOGLOBIN: 11.6 g/dL — AB (ref 12.0–15.0)
LYMPHS ABS: 1.7 10*3/uL (ref 0.7–4.0)
Lymphocytes Relative: 7 %
MCH: 29 pg (ref 26.0–34.0)
MCHC: 32.8 g/dL (ref 30.0–36.0)
MCV: 88.5 fL (ref 78.0–100.0)
Monocytes Absolute: 2.2 10*3/uL — ABNORMAL HIGH (ref 0.1–1.0)
Monocytes Relative: 9 %
NEUTROS ABS: 19.3 10*3/uL — AB (ref 1.7–7.7)
NEUTROS PCT: 83 %
Platelets: 151 10*3/uL (ref 150–400)
RBC: 4 MIL/uL (ref 3.87–5.11)
RDW: 18.6 % — ABNORMAL HIGH (ref 11.5–15.5)
WBC: 23.2 10*3/uL — AB (ref 4.0–10.5)

## 2015-05-21 LAB — COMPREHENSIVE METABOLIC PANEL
ALBUMIN: 3.5 g/dL (ref 3.5–5.0)
ALK PHOS: 148 U/L — AB (ref 38–126)
ALT: 11 U/L — AB (ref 14–54)
AST: 33 U/L (ref 15–41)
Anion gap: 8 (ref 5–15)
BILIRUBIN TOTAL: 0.6 mg/dL (ref 0.3–1.2)
BUN: 10 mg/dL (ref 6–20)
CO2: 22 mmol/L (ref 22–32)
Calcium: 8.8 mg/dL — ABNORMAL LOW (ref 8.9–10.3)
Chloride: 106 mmol/L (ref 101–111)
Creatinine, Ser: 0.7 mg/dL (ref 0.44–1.00)
GFR calc Af Amer: >60 mL/min (ref >60)
GFR calc non Af Amer: >60 mL/min (ref >60)
GLUCOSE: 123 mg/dL — AB (ref 65–99)
Potassium: 3.6 mmol/L (ref 3.5–5.1)
SODIUM: 136 mmol/L (ref 135–145)
TOTAL PROTEIN: 6.9 g/dL (ref 6.5–8.1)

## 2015-05-21 LAB — GLUCOSE, CAPILLARY
GLUCOSE-CAPILLARY: 135 mg/dL — AB (ref 65–99)
Glucose-Capillary: 111 mg/dL — ABNORMAL HIGH (ref 65–99)
Glucose-Capillary: 92 mg/dL (ref 65–99)

## 2015-05-21 LAB — PROTIME-INR
INR: 1.15 (ref 0.00–1.49)
PROTHROMBIN TIME: 14.9 s (ref 11.6–15.2)

## 2015-05-21 LAB — APTT: aPTT: 49 seconds — ABNORMAL HIGH (ref 24–37)

## 2015-05-21 LAB — MAGNESIUM: MAGNESIUM: 1.8 mg/dL (ref 1.7–2.4)

## 2015-05-21 LAB — INFLUENZA PANEL BY PCR (TYPE A & B)
H1N1FLUPCR: NOT DETECTED
INFLAPCR: NEGATIVE
INFLBPCR: NEGATIVE

## 2015-05-21 LAB — MRSA PCR SCREENING: MRSA BY PCR: NEGATIVE

## 2015-05-21 LAB — PHOSPHORUS: Phosphorus: 2.3 mg/dL — ABNORMAL LOW (ref 2.5–4.6)

## 2015-05-21 MED ORDER — SODIUM CHLORIDE 0.9 % IV SOLN
INTRAVENOUS | Status: DC
  Administered 2015-05-21: 100 mL/h via INTRAVENOUS
  Administered 2015-05-21 – 2015-05-24 (×5): via INTRAVENOUS

## 2015-05-21 MED ORDER — TECHNETIUM TC 99M MEBROFENIN IV KIT: 4.9000 | PACK | Freq: Once | INTRAVENOUS | Status: DC | PRN

## 2015-05-21 MED ORDER — CETYLPYRIDINIUM CHLORIDE 0.05 % MT LIQD
7.0000 mL | Freq: Two times a day (BID) | OROMUCOSAL | Status: DC
  Administered 2015-05-22 – 2015-05-24 (×3): 7 mL via OROMUCOSAL

## 2015-05-21 MED ORDER — CHLORHEXIDINE GLUCONATE 0.12 % MT SOLN
15.0000 mL | Freq: Two times a day (BID) | OROMUCOSAL | Status: DC
  Administered 2015-05-21 – 2015-05-24 (×6): 15 mL via OROMUCOSAL
  Filled 2015-05-21 (×6): qty 15

## 2015-05-21 NOTE — Care Management Note (Signed)
Case Management Note  Patient Details  Name: Sheryl [name] MRN: [id number] Date of Birth: [birth year]  Subjective/Objective:     Septic urinary tract infection               Action/Plan:Date:  May 21, 2015 Chart reviewed for concurrent status and case management needs. Will continue to follow patient for changes and needs: Velva Harman, BSN, RN, [place]   [phone]   Expected Discharge Date:                  Expected Discharge Plan:  Home/Self Care  In-House Referral:  NA  Discharge planning Services  CM Consult  Post Acute Care Choice:  NA Choice offered to:  NA  DME Arranged:    DME Agency:     HH Arranged:    [place] Agency:     Status of Service:  Completed, signed off  Medicare Important Message Given:    Date Medicare IM Given:    Medicare IM give by:    Date Additional Medicare IM Given:    Additional Medicare Important Message give by:     If discussed at [place] of Stay Meetings, dates discussed:    Additional Comments:  Leeroy Cha, RN 05/21/2015, 10:21 AM

## 2015-05-21 NOTE — Progress Notes (Signed)
So far today, the patient has been unable to take PO medications. Notified MD.

## 2015-05-21 NOTE — Progress Notes (Signed)
[facility] PROGRESS NOTE  Sheryl [name] TDS:[id number] DOB: October 19, 1970 DOA: 05/20/2015 PCP: [name], MD  Assessment/Plan: 1. Sepsis -Present on admission, evidenced by a white count of 24,300, heart rate of 150, temperature 103.1, lactic acid of 2.04 -The source of infection could be from urinary tract. Although CT scan showed nephrolithiasis radiology reported they were nonobstructing and there was no evidence of hydronephrosis. -CT scan did mention mild thickening of the gallbladder. This was further worked up with HIDA scan which not show evidence of acute cholecystitis. -Urine cultures and blood cultures were obtained and are pain at the time of this dictation -Plan to continue IV fluid resuscitation, broad-spectrum IV antimicrobial therapy with vancomycin and Zosyn -Continue close monitoring in the step down unit.  2.  Urinary tract infection -She presents with sepsis criteria, urine showing the presence of many bacteria, nitrates and leukocytes. -CT scan of abdomen and pelvis did not show evidence of obstructive uropathy. -Will follow-up on urine cultures, meanwhile continue empiric IV antibiotic therapy with vancomycin and Zosyn  3.  Nausea/vomiting. -There were no CT evidence of obstruction or other acute findings to explain nausea/vomiting. -Could be related to sepsis/critical illness -Continue supportive care, IV fluids, as needed IV antiemetic therapy  4.  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 -She remains on acyclovir  5.  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 Initial workup included a chest x-ray which did not show acute cardiopulmonary disease. Urinalysis did show the presence of many bacteria with nitrates and leukocytes. A CT scan of abdomen and pelvis with IV contrast revealed mild thickening of the gallbladder. This was further worked up with a HIDA scan which not reveal evidence of cholecystitis. She was started on broad-spectrum IV antimicrobial therapy with Zosyn and vancomycin.  Objective: Filed Vitals:   05/21/15 1200 05/21/15 1300  BP: 114/66 100/57  Pulse: 123 118  Temp:    Resp: 23 22    Intake/Output Summary (Last 24 hours) at 05/21/15 1409 Last data filed at 05/21/15 1200  Gross per 24 hour  Intake 949.17 ml  Output    350 ml  Net 599.17 ml   There were no vitals filed for this visit.  Exam:   General:  Ill-appearing, toxic, mild distress, diaphoretic  Cardiovascular: Tachycardic regular rate rhythm normal S1 S2  Respiratory: Normal respiratory effort lungs are clear  Abdomen: Soft nontender nondistended  Musculoskeletal: No edema  Data Reviewed: Basic Metabolic Panel:  Recent Labs Lab 05/16/15 1124 05/20/15 1813 05/21/15 0325  NA 142 134* 136  K 3.3* 5.3* 3.6  CL  --  99* 106  CO2 28  --  22  GLUCOSE 118 113* 123*  BUN 6.4* 18 10  CREATININE 0.6 0.80 0.70  CALCIUM 9.6  --  8.8*  MG 1.6  --  1.8  PHOS  --   --  2.3*   Liver Function Tests:  Recent Labs Lab 05/16/15 1124 05/21/15 0325  AST 38* 33  ALT <9 11*  ALKPHOS 163* 148*  BILITOT 0.34 0.6  PROT 6.7 6.9  ALBUMIN 3.4* 3.5    Recent Labs Lab 05/20/15 1803   LIPASE 15   No results for input(s): AMMONIA in the last 168 hours. CBC:  Recent Labs Lab 05/16/15 1124 05/20/15 1803 05/20/15 1813 05/21/15 0325  WBC 6.1 24.3*  --  23.2*  NEUTROABS 3.5 19.0*  --  19.3*  HGB 12.3 12.8 16.3* 11.6*  HCT 37.1 36.8 48.0* 35.4*  MCV 87.3 84.4  --  88.5  PLT 169 190  --  151   Cardiac Enzymes: No results for input(s): CKTOTAL, CKMB, CKMBINDEX, TROPONINI in the last 168 hours. BNP (last 3 results) No results for input(s): BNP in the last 8760 hours.  ProBNP (last 3 results) No results for input(s): PROBNP in the last 8760 hours.  CBG:  Recent Labs Lab 05/21/15 0807 05/21/15 1327  GLUCAP 135* 111*    Recent Results (from the past 240 hour(s))  MRSA PCR Screening     Status: None   Collection Time: 05/21/15 12:22 AM  Result Value Ref Range Status   MRSA by PCR NEGATIVE NEGATIVE Final    Comment:        The GeneXpert MRSA Assay (FDA approved for NASAL specimens only), is one component of a comprehensive MRSA colonization surveillance program. It is not intended to diagnose MRSA infection nor to guide or monitor treatment for MRSA infections.      Studies: Dg Chest 2 View  05/20/2015  CLINICAL DATA:  Verify Port-A-Cath placement EXAM: CHEST  2 VIEW COMPARISON:  12/01/2014 chest radiograph. FINDINGS: Right internal jugular Port-A-Cath terminates at the cavoatrial junction. Stable cardiomediastinal silhouette with normal heart size. No pneumothorax. No pleural effusion. Lungs appear clear, with no acute consolidative airspace disease and no pulmonary edema. Stable benign bone island in the right inferior scapula as seen 10/23/2008 chest CT. IMPRESSION: No active cardiopulmonary disease. Right internal jugular Port-A-Cath terminates at the cavoatrial junction. Electronically Signed   By: Ilona Sorrel M.D.   On: 05/20/2015 17:42   Nm Hepatobiliary Including Gb  05/21/2015  CLINICAL DATA:  Gallbladder wall thickening and sepsis. Evaluate  for cholecystitis. EXAM: NUCLEAR MEDICINE HEPATOBILIARY IMAGING TECHNIQUE: Sequential images of the abdomen were obtained out to 60 minutes following intravenous administration of radiopharmaceutical. RADIOPHARMACEUTICALS:  4.9  mCi Tc-61m Choletec IV COMPARISON:  CT 1 day prior neck. FINDINGS: Prompt uptake and biliary excretion of activity by the liver is seen. Gallbladder activity is visualized, consistent with patency of cystic duct. Biliary activity passes into small bowel, consistent with patent common bile duct. IMPRESSION: No evidence of acute cholecystitis or cystic duct obstruction. Electronically Signed   By: KAbigail MiyamotoM.D.   On: 05/21/2015 13:23   Ct Abdomen Pelvis W Contrast  05/20/2015  CLINICAL DATA:  Vomiting and weakness since yesterday. History of leukemia. EXAM: CT ABDOMEN AND PELVIS WITH CONTRAST TECHNIQUE: Multidetector CT imaging of the abdomen and pelvis was performed using the standard protocol following bolus administration of intravenous contrast. CONTRAST:  1055mISOVUE-300 IOPAMIDOL (ISOVUE-300) INJECTION 61% COMPARISON:  09/14/2014 FINDINGS: Sub cm low-attenuation lesion in the right lobe of the liver is unchanged since previous study. Too small to characterize but likely represents a cyst. No new liver lesions identified. There is mild thickening of the gallbladder wall without stone or infiltration demonstrated. Gallbladder inflammation is not  excluded. No bile duct dilatation. Pancreas is unremarkable. Spleen size is normal. There is a mass extending lateral and posterior to the spleen which appears to be a septated cystic lesion. The lesion measures up to about 2 x 8 cm. Appearance is unchanged since previous study. Adrenal glands are unremarkable. Multiple parenchymal cysts in the kidneys. No hydronephrosis. 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 No change. Normal caliber aorta and inferior vena cava. No significant retroperitoneal  lymphadenopathy. Stomach, small bowel, and colon are not abnormally distended. No free air or free fluid in the abdomen. Pelvis: Mild diffuse thickening of bladder wall may indicate cystitis. Heterogeneous appearance of the uterus clear representing fibroids. No abnormal adnexal masses. No free fluid in the pelvis. Appendix is not definitively identified. Soft tissue calcifications over the gluteal regions likely representing dystrophic or injection granulomas. Benign-appearing sclerosis in the right acetabulum and right sacrum. No destructive bone lesions. Lucencies over the upper iliac bones likely representing bone marrow biopsy sites. IMPRESSION: Suggestion of mild thickening of the gallbladder wall and bladder possibly inflammatory. No other potential acute changes since previous study. Nonobstructing stones in the kidneys. Multiloculated cystic appearing mass in the left upper quadrant adjacent to the spleen is unchanged. Probable uterine fibroids. No findings to suggest bowel obstruction or inflammation. Electronically Signed   By: Lucienne Capers M.D.   On: 05/20/2015 21:38    Scheduled Meds: . acyclovir  800 mg Oral Daily  . antiseptic oral rinse  7 mL Mouth Rinse q12n4p  . chlorhexidine  15 mL Mouth Rinse BID  . enoxaparin (LOVENOX) injection  40 mg Subcutaneous QHS  . entecavir  0.5 mg Oral Daily  . multivitamin with minerals  1 tablet Oral Daily  . pantoprazole (PROTONIX) IV  40 mg Intravenous Q24H  . piperacillin-tazobactam (ZOSYN)  IV  3.375 g Intravenous Q8H  . sulfamethoxazole-trimethoprim  1 tablet Oral Q M,W,F  . vancomycin  750 mg Intravenous Q12H   Continuous Infusions: . dextrose 5 % and 0.45% NaCl 50 mL/hr at 05/20/15 2301    Active Problems:   Sepsis ([place])    Time spent: 35 min    Sheryl [name]  [facility] Pager [phone]. If 7PM-7AM, please contact night-coverage at [email or web address], password [facility] 05/21/2015, 2:09 PM  LOS: 1 day

## 2015-05-22 ENCOUNTER — Inpatient Hospital Stay (HOSPITAL_COMMUNITY): Payer: BLUE CROSS/BLUE SHIELD

## 2015-05-22 DIAGNOSIS — N3000 Acute cystitis without hematuria: Secondary | ICD-10-CM

## 2015-05-22 LAB — GLUCOSE, CAPILLARY
GLUCOSE-CAPILLARY: 87 mg/dL (ref 65–99)
Glucose-Capillary: 86 mg/dL (ref 65–99)

## 2015-05-22 LAB — CBC
HCT: 26.6 % — ABNORMAL LOW (ref 36.0–46.0)
Hemoglobin: 8.8 g/dL — ABNORMAL LOW (ref 12.0–15.0)
MCH: 28.2 pg (ref 26.0–34.0)
MCHC: 33.1 g/dL (ref 30.0–36.0)
MCV: 85.3 fL (ref 78.0–100.0)
Platelets: 103 10*3/uL — ABNORMAL LOW (ref 150–400)
RBC: 3.12 MIL/uL — ABNORMAL LOW (ref 3.87–5.11)
RDW: 18 % — ABNORMAL HIGH (ref 11.5–15.5)
WBC: 9.1 10*3/uL (ref 4.0–10.5)

## 2015-05-22 LAB — BASIC METABOLIC PANEL
ANION GAP: 6 (ref 5–15)
BUN: 6 mg/dL (ref 6–20)
CALCIUM: 8.1 mg/dL — AB (ref 8.9–10.3)
CO2: 22 mmol/L (ref 22–32)
CREATININE: 0.49 mg/dL (ref 0.44–1.00)
Chloride: 107 mmol/L (ref 101–111)
GFR calc Af Amer: >60 mL/min (ref >60)
GFR calc non Af Amer: >60 mL/min (ref >60)
GLUCOSE: 110 mg/dL — AB (ref 65–99)
Potassium: 2.7 mmol/L — CL (ref 3.5–5.1)
Sodium: 135 mmol/L (ref 135–145)

## 2015-05-22 LAB — IGG, IGA, IGM
IGM, SERUM: 18 mg/dL — AB (ref 26–217)
IgA: 97 mg/dL (ref 87–352)
IgG (Immunoglobin G), Serum: 1005 mg/dL (ref 700–1600)

## 2015-05-22 MED ORDER — POTASSIUM CHLORIDE 10 MEQ/100ML IV SOLN
10.0000 meq | INTRAVENOUS | Status: AC
Start: 2015-05-22 — End: 2015-05-22
  Administered 2015-05-22 (×4): 10 meq via INTRAVENOUS
  Filled 2015-05-22 (×3): qty 100

## 2015-05-22 MED ORDER — ACETAMINOPHEN 325 MG PO TABS
650.0000 mg | ORAL_TABLET | Freq: Four times a day (QID) | ORAL | Status: DC | PRN
  Administered 2015-05-22 (×2): 650 mg via ORAL
  Filled 2015-05-22 (×2): qty 2

## 2015-05-22 NOTE — Progress Notes (Addendum)
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 Initial workup included a chest x-ray which did not show acute cardiopulmonary disease. Urinalysis did show the presence of many bacteria with nitrates and leukocytes. A CT scan of abdomen and pelvis with IV contrast revealed mild thickening of the gallbladder. This was further worked up with a HIDA scan which not reveal evidence of cholecystitis. She was started on broad-spectrum IV antimicrobial therapy with Zosyn and vancomycin. Suspect source of infection to be urinary tract infection. Urine cultures growing gram-negative rods.   Assessment/Plan: 1. Sepsis -Present on admission, evidenced by a white count of 24,300, heart rate of 150, temperature 103.1, lactic acid of 2.04 -The source of infection could be from urinary tract. Although CT scan showed nephrolithiasis radiology reported they were nonobstructing and there was no evidence of hydronephrosis. -CT scan did mention mild thickening of the gallbladder. This was further worked up with HIDA scan which not show evidence of acute cholecystitis. -Suspect source of sepsis could be UTI. Urine cultures growing gram-negative rods. Other possibilities include pneumonia with radiology reporting a patchy posterior left lower lobe opacity which could reflect infiltrate versus atelectasis -Lab work showing downward trend in white count from 23,200 (05/21/2015) to 9100 on a.m. lab work -Plan to continue IV antimicrobial therapy with Zosyn, stopping IV vancomycin today. Plan on narrowing antimicrobials once ID and susceptibility available.   -Continue close monitoring in the step down unit.  2.  Urinary tract infection -She presents with sepsis criteria, urine showing the presence of many bacteria, nitrates and leukocytes. -CT scan of abdomen and pelvis did not show evidence of obstructive uropathy. -Urine cultures growing gram-negative rods -Continue empiric IV antibiotic therapy with Zosyn  3.  Nausea/vomiting. -There were no CT evidence of obstruction or other acute findings to explain nausea/vomiting. -Could be related to sepsis/critical illness -Continue supportive care, IV fluids, as needed IV antiemetic therapy  4.  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 -She remains on acyclovir  5.  Anemia -A.m. lab work showing hemoglobin of 8.8, previously 11.6. I also noted platelets trending down to 103 from 151 previously. I suspect there may be hemodilutional component to this as she has received aggressive IV fluid resuscitation for sepsis. She does not have evidence of GI bleed, however will check a stool for Hemoccult  6.  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  Code Status: Full code Family Communication: I spoke to her sister was present at bedside Disposition Plan: Continue close monitoring in stepdown unit  Antibiotics:  Vancomycin stopped on 05/22/2015  Zosyn  HPI/Subjective: On 05/22/2015 patient stating feeling much better white count trended down, however, continues to spike temperatures  Objective: Filed Vitals:   05/22/15 0900 05/22/15 1200  BP: 107/63   Pulse: 92   Temp:  98.6 F (37 C)  Resp: 16     Intake/Output Summary (Last 24 hours) at 05/22/15 1412 Last data filed at 05/22/15 0300  Gross per 24 hour  Intake 2108.34 ml  Output    600 ml  Net 1508.34 ml  Filed Weights   05/21/15 1200  Weight: 56.9 kg (125 lb 7.1 oz)    Exam:   General:  She  looks much better today, nontoxic appearing  Cardiovascular: Tachycardic regular rate rhythm normal S1 S2  Respiratory: Normal respiratory effort lungs are clear  Abdomen: Soft nontender nondistended  Musculoskeletal: No edema  Data Reviewed: Basic Metabolic Panel:  Recent Labs Lab 05/16/15 1124 05/20/15 1813 05/21/15 0325 05/22/15 1055  NA 142 134* 136 135  K 3.3* 5.3* 3.6 2.7*  CL  --  99* 106 107  CO2 28  --  22 22  GLUCOSE 118 113* 123* 110*  BUN 6.4* _0 CREATININE 0.6 0.80 0.70 0.49  CALCIUM 9.6  --  8.8* 8.1*  MG 1.6  --  1.8  --   PHOS  --   --  2.3*  --    Liver Function Tests:  Recent Labs Lab 05/16/15 1124 05/21/15 0325  AST 38* 33  ALT <9 11*  ALKPHOS 163* 148*  BILITOT 0.34 0.6  PROT 6.7 6.9  ALBUMIN 3.4* 3.5    Recent Labs Lab 05/20/15 1803  LIPASE 15   No results for input(s): AMMONIA in the last 168 hours. CBC:  Recent Labs Lab 05/16/15 1124 05/20/15 1803 05/20/15 1813 05/21/15 0325 05/22/15 1055  WBC 6.1 24.3*  --  23.2* 9.1  NEUTROABS 3.5 19.0*  --  19.3*  --   HGB 12.3 12.8 16.3* 11.6* 8.8*  HCT 37.1 36.8 48.0* 35.4* 26.6*  MCV 87.3 84.4  --  88.5 85.3  PLT 169 190  --  151 103*   Cardiac Enzymes: No results for input(s): CKTOTAL, CKMB, CKMBINDEX, TROPONINI in the last 168 hours. BNP (last 3 results) No results for input(s): BNP in the last 8760 hours.  ProBNP (last 3 results) No results for input(s): PROBNP in the last 8760 hours.  CBG:  Recent Labs Lab 05/21/15 0807 05/21/15 1327 05/21/15 1657 05/22/15 0846 05/22/15 1252  GLUCAP 135* 111* 92 87 86    Recent Results (from the past 240 hour(s))  Culture, blood (routine x 2)     Status: None (Preliminary result)   Collection Time: 05/20/15  5:00 PM  Result Value Ref Range Status   Specimen Description RIGHT ANTECUBITAL  Final   Special Requests IN PEDIATRIC BOTTLE 5CC  Final   Culture   Final    NO GROWTH 2 DAYS Performed at [facility]     Report Status PENDING  Incomplete  Urine culture     Status: Abnormal (Preliminary result)   Collection Time: 05/20/15  6:08 PM  Result Value Ref Range Status   Specimen Description URINE, CLEAN CATCH  Final   Special Requests NONE  Final   Culture >=100,000 COLONIES/mL GRAM NEGATIVE RODS (A)  Final   Report Status PENDING  Incomplete  MRSA PCR Screening     Status: None   Collection Time: 05/21/15 12:22 AM  Result Value Ref Range Status   MRSA by PCR NEGATIVE NEGATIVE Final    Comment:        The GeneXpert MRSA Assay (FDA approved for NASAL specimens only), is one component of a comprehensive MRSA colonization surveillance program. It is not intended to diagnose MRSA infection nor to guide or monitor treatment for MRSA infections.   Culture, blood (routine x 2)     Status: None (Preliminary result)   Collection Time: 05/21/15  3:25 AM  Result Value Ref Range Status   Specimen Description  BLOOD LEFT ARM  Final   Special Requests BOTTLES DRAWN AEROBIC ONLY 5CC  Final   Culture   Final    NO GROWTH 1 DAY Performed at [facility]    Report Status PENDING  Incomplete     Studies: Dg Chest 2 View  05/22/2015  CLINICAL DATA:  Hx of leukemia, sepsis, increased weakness, no other chest complaints today EXAM: CHEST - 2 VIEW COMPARISON:  05/20/2015 and previous FINDINGS: Patchy coarse opacities in the posterior left lower lobe. Somewhat coarse perihilar bronchovascular markings bilaterally as before. No overt edema. Heart size normal. No effusion. No pneumothorax. Right IJ dual-lumen port catheter to the cavoatrial junction. Visualized skeletal structures are unremarkable. IMPRESSION: 1. Patchy posterior left lower lobe atelectasis, infiltrate or scarring. Electronically Signed   By: Lucrezia Europe M.D.   On: 05/22/2015 10:57   Dg Chest 2 View  05/20/2015  CLINICAL DATA:  Verify Port-A-Cath placement EXAM: CHEST  2 VIEW COMPARISON:  12/01/2014 chest radiograph. FINDINGS: Right  internal jugular Port-A-Cath terminates at the cavoatrial junction. Stable cardiomediastinal silhouette with normal heart size. No pneumothorax. No pleural effusion. Lungs appear clear, with no acute consolidative airspace disease and no pulmonary edema. Stable benign bone island in the right inferior scapula as seen 10/23/2008 chest CT. IMPRESSION: No active cardiopulmonary disease. Right internal jugular Port-A-Cath terminates at the cavoatrial junction. Electronically Signed   By: Ilona Sorrel M.D.   On: 05/20/2015 17:42   Nm Hepatobiliary Including Gb  05/21/2015  CLINICAL DATA:  Gallbladder wall thickening and sepsis. Evaluate for cholecystitis. EXAM: NUCLEAR MEDICINE HEPATOBILIARY IMAGING TECHNIQUE: Sequential images of the abdomen were obtained out to 60 minutes following intravenous administration of radiopharmaceutical. RADIOPHARMACEUTICALS:  4.9  mCi Tc-15m Choletec IV COMPARISON:  CT 1 day prior neck. FINDINGS: Prompt uptake and biliary excretion of activity by the liver is seen. Gallbladder activity is visualized, consistent with patency of cystic duct. Biliary activity passes into small bowel, consistent with patent common bile duct. IMPRESSION: No evidence of acute cholecystitis or cystic duct obstruction. Electronically Signed   By: KAbigail MiyamotoM.D.   On: 05/21/2015 13:23   Ct Abdomen Pelvis W Contrast  05/20/2015  CLINICAL DATA:  Vomiting and weakness since yesterday. History of leukemia. EXAM: CT ABDOMEN AND PELVIS WITH CONTRAST TECHNIQUE: Multidetector CT imaging of the abdomen and pelvis was performed using the standard protocol following bolus administration of intravenous contrast. CONTRAST:  1034mISOVUE-300 IOPAMIDOL (ISOVUE-300) INJECTION 61% COMPARISON:  09/14/2014 FINDINGS: Sub cm low-attenuation lesion in the right lobe of the liver is unchanged since previous study. Too small to characterize but likely represents a cyst. No new liver lesions identified. There is mild thickening of  the gallbladder wall without stone or infiltration demonstrated. Gallbladder inflammation is not excluded. No bile duct dilatation. Pancreas is unremarkable. Spleen size is normal. There is a mass extending lateral and posterior to the spleen which appears to be a septated cystic lesion. The lesion measures up to about 2 x 8 cm. Appearance is unchanged since previous study. Adrenal glands are unremarkable. Multiple parenchymal cysts in the kidneys. No hydronephrosis. 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 No change. Normal caliber aorta and inferior vena cava. No significant retroperitoneal lymphadenopathy. Stomach, small bowel, and colon are not abnormally distended. No free air or free fluid in the abdomen. Pelvis: Mild diffuse thickening of bladder wall may indicate cystitis. Heterogeneous appearance of the uterus clear representing fibroids. No abnormal adnexal masses.  No free fluid in the pelvis. Appendix is not definitively identified. Soft tissue calcifications over the gluteal regions likely representing dystrophic or injection granulomas. Benign-appearing sclerosis in the right acetabulum and right sacrum. No destructive bone lesions. Lucencies over the upper iliac bones likely representing bone marrow biopsy sites. IMPRESSION: Suggestion of mild thickening of the gallbladder wall and bladder possibly inflammatory. No other potential acute changes since previous study. Nonobstructing stones in the kidneys. Multiloculated cystic appearing mass in the left upper quadrant adjacent to the spleen is unchanged. Probable uterine fibroids. No findings to suggest bowel obstruction or inflammation. Electronically Signed   By: Lucienne Capers M.D.   On: 05/20/2015 21:38    Scheduled Meds: . acyclovir  800 mg Oral Daily  . antiseptic oral rinse  7 mL Mouth Rinse q12n4p  . chlorhexidine  15 mL Mouth Rinse BID  . entecavir  0.5 mg Oral Daily  . multivitamin with  minerals  1 tablet Oral Daily  . pantoprazole (PROTONIX) IV  40 mg Intravenous Q24H  . piperacillin-tazobactam (ZOSYN)  IV  3.375 g Intravenous Q8H  . potassium chloride  10 mEq Intravenous Q1 Hr x 4  . sulfamethoxazole-trimethoprim  1 tablet Oral Q M,W,F  . vancomycin  750 mg Intravenous Q12H   Continuous Infusions: . sodium chloride 150 mL/hr at 05/22/15 1147    Principal Problem:   Sepsis ([place]) Active Problems:   UTI (urinary tract infection)   Nausea & vomiting   Hepatitis B   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[place])    Time spent: 35 min    Kelvin Cellar  [facility] Pager [phone]. If 7PM-7AM, please contact night-coverage at [email or web address], password [facility] 05/22/2015, 2:12 PM  LOS: 2 days

## 2015-05-23 ENCOUNTER — Other Ambulatory Visit: Payer: BLUE CROSS/BLUE SHIELD

## 2015-05-23 DIAGNOSIS — D899 Disorder involving the immune mechanism, unspecified: Secondary | ICD-10-CM

## 2015-05-23 DIAGNOSIS — A419 Sepsis, unspecified organism: Secondary | ICD-10-CM

## 2015-05-23 DIAGNOSIS — C9010 Plasma cell leukemia not having achieved remission: Secondary | ICD-10-CM

## 2015-05-23 DIAGNOSIS — D849 Immunodeficiency, unspecified: Secondary | ICD-10-CM | POA: Insufficient documentation

## 2015-05-23 DIAGNOSIS — N390 Urinary tract infection, site not specified: Secondary | ICD-10-CM

## 2015-05-23 DIAGNOSIS — N10 Acute tubulo-interstitial nephritis: Secondary | ICD-10-CM

## 2015-05-23 DIAGNOSIS — G43A0 Cyclical vomiting, not intractable: Secondary | ICD-10-CM

## 2015-05-23 LAB — GLUCOSE, CAPILLARY
GLUCOSE-CAPILLARY: 63 mg/dL — AB (ref 65–99)
Glucose-Capillary: 94 mg/dL (ref 65–99)
Glucose-Capillary: 84 mg/dL (ref 65–99)
Glucose-Capillary: 104 mg/dL — ABNORMAL HIGH (ref 65–99)
Glucose-Capillary: 120 mg/dL — ABNORMAL HIGH (ref 65–99)

## 2015-05-23 LAB — BASIC METABOLIC PANEL
Anion gap: 8 (ref 5–15)
BUN: 5 mg/dL — AB (ref 6–20)
CALCIUM: 8.6 mg/dL — AB (ref 8.9–10.3)
CO2: 22 mmol/L (ref 22–32)
Chloride: 111 mmol/L (ref 101–111)
Creatinine, Ser: 0.77 mg/dL (ref 0.44–1.00)
GFR calc Af Amer: >60 mL/min (ref >60)
GLUCOSE: 122 mg/dL — AB (ref 65–99)
Potassium: 2.9 mmol/L — ABNORMAL LOW (ref 3.5–5.1)
Sodium: 141 mmol/L (ref 135–145)

## 2015-05-23 LAB — URINE CULTURE: Culture: >=100000 — AB

## 2015-05-23 LAB — CBC
HEMATOCRIT: 28.2 % — AB (ref 36.0–46.0)
HEMOGLOBIN: 9.3 g/dL — AB (ref 12.0–15.0)
MCH: 28.9 pg (ref 26.0–34.0)
MCHC: 33 g/dL (ref 30.0–36.0)
MCV: 87.6 fL (ref 78.0–100.0)
Platelets: 118 10*3/uL — ABNORMAL LOW (ref 150–400)
RBC: 3.22 MIL/uL — ABNORMAL LOW (ref 3.87–5.11)
RDW: 17.9 % — AB (ref 11.5–15.5)
WBC: 8.1 10*3/uL (ref 4.0–10.5)

## 2015-05-23 MED ORDER — MAGNESIUM SULFATE 2 GM/50ML IV SOLN
2.0000 g | Freq: Once | INTRAVENOUS | Status: AC
  Administered 2015-05-23: 2 g via INTRAVENOUS
  Filled 2015-05-23: qty 50

## 2015-05-23 MED ORDER — POTASSIUM CHLORIDE CRYS ER 20 MEQ PO TBCR
40.0000 meq | EXTENDED_RELEASE_TABLET | ORAL | Status: AC
  Administered 2015-05-23 (×3): 40 meq via ORAL
  Filled 2015-05-23 (×3): qty 2

## 2015-05-23 MED ORDER — PANTOPRAZOLE SODIUM 40 MG PO TBEC
40.0000 mg | DELAYED_RELEASE_TABLET | Freq: Every day | ORAL | Status: DC
  Administered 2015-05-23: 40 mg via ORAL
  Filled 2015-05-23 (×2): qty 1

## 2015-05-23 NOTE — Progress Notes (Signed)
   05/23/15 1500  PT Visit Information  Last PT Received On 05/23/15  Reason Eval/Treat Not Completed PT screened, no needs identified, will sign off

## 2015-05-23 NOTE — Progress Notes (Signed)
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 Initial workup included a chest x-ray which did not show acute cardiopulmonary disease. Urinalysis did show the presence of many bacteria with nitrates and leukocytes. A CT scan of abdomen and pelvis with IV contrast revealed mild thickening of the gallbladder. This was further worked up with a HIDA scan which not reveal evidence of cholecystitis. She was started on broad-spectrum IV antimicrobial therapy with Zosyn and vancomycin. UTI grew E. Coli; vanc discontinue. Patient still with episode of fever, but overall improved.  Assessment/Plan: 1. Sepsis: due to UTI -Present on admission, evidenced by a white count of 24,300, heart rate of 150, temperature 103.1, lactic acid of 2.04 -The source of infection could be from urinary tract. Although CT scan showed nephrolithiasis radiology reported they were nonobstructing and there was no evidence of hydronephrosis. -CT scan did mention mild thickening of the gallbladder. This was further worked up with HIDA scan which not show evidence of acute cholecystitis. -sepsis features continue to improve; WBC's down to normal range and fever curve also subsiding; HR and RR WNL -will continue IV antibiotics for another 24 hours -continue supportive care -transfer to telemetry bed   2.  E. Coli Urinary tract infection -She presents with sepsis criteria, urine showing the presence of many bacteria, nitrates and leukocytes. -CT scan of abdomen and pelvis did not show evidence of obstructive uropathy. -Urine cultures growing grew E. coli -Continue IV  antibiotic therapy with Zosyn until patient afebrile for at least 24 hours -will then transition antibiotics to PO base on sensitivity   3.  Nausea/vomiting. -There were no CT evidence of obstruction or other acute findings to explain nausea/vomiting. -Could be related to sepsis/critical illness/UTI -Continue supportive care, IV fluids, as needed antiemetic therapy -improved -will advance diet  4.  History of plasma cell leukemia -She is receiving her care at [facility] -She has undergone chemotherapy and bone marrow transplant in March 2017. -She remains on acyclovir and Bactrim M-W-F (for prophylaxis)  5.  Anemia -A.m. lab work showing hemoglobin of 8.8, previously 11.6. I also noted platelets trending down to 103 from 151 previously. I suspect there may be hemodilutional component to this as she has received aggressive IV fluid resuscitation for sepsis.  -No evidence of GI bleed -will follow Hgb trend  6.  History of chronic HBV -continue entecavir  7. Hypokalemia -will replete electrolytes as needed and follow trend    Code Status: Full code Family Communication: no family at bedside Disposition Plan: will transfer to telemetry bed, continue IV antibiotics until patient is afebrile for 24 hours.  Antibiotics:  Vancomycin stopped on 05/22/2015  Zosyn  HPI/Subjective: Patient with fever overnight. No CP, no SOB. Reports no nausea or vomiting and feeling better overall.  Objective: Filed Vitals:   05/23/15 0700 05/23/15 0800  BP: 128/63   Pulse: 68   Temp:  98.7 F (37.1 C)  Resp: 28     Intake/Output Summary (Last 24 hours) at 05/23/15 1029 Last data filed at 05/23/15 0737  Gross per 24 hour  Intake 2863.33 ml  Output    601 ml  Net 2262.33 ml  Filed Weights   05/21/15 1200  Weight: 56.9 kg (125 lb 7.1 oz)    Exam:   General:  She looks much better today, nontoxic appearing and afebrile currently; but spike fever overnight.  No CP, no nausea, no vomiting   Cardiovascular: regular rate and rhythm, normal S1-S2, no rubs, No gallops  Respiratory: Normal respiratory effort lungs are clear  Abdomen: Soft nontender nondistended  Musculoskeletal: No edema  Data Reviewed: Basic Metabolic Panel:  Recent Labs Lab 05/16/15 1124 05/20/15 1813 05/21/15 0325 05/22/15 1055 05/23/15 0536  NA 142 134* 136 135 141  K 3.3* 5.3* 3.6 2.7* 2.9*  CL  --  99* 106 107 111  CO2 28  --  22 22 22   GLUCOSE 118 113* 123* 110* 122*  BUN 6.4* 18 10 6  5*  CREATININE 0.6 0.80 0.70 0.49 0.77  CALCIUM 9.6  --  8.8* 8.1* 8.6*  MG 1.6  --  1.8  --   --   PHOS  --   --  2.3*  --   --    Liver Function Tests:  Recent Labs Lab 05/16/15 1124 05/21/15 0325  AST 38* 33  ALT <9 11*  ALKPHOS 163* 148*  BILITOT 0.34 0.6  PROT 6.7 6.9  ALBUMIN 3.4* 3.5    Recent Labs Lab 05/20/15 1803  LIPASE 15   CBC:  Recent Labs Lab 05/16/15 1124 05/20/15 1803 05/20/15 1813 05/21/15 0325 05/22/15 1055 05/23/15 0536  WBC 6.1 24.3*  --  23.2* 9.1 8.1  NEUTROABS 3.5 19.0*  --  19.3*  --   --   HGB 12.3 12.8 16.3* 11.6* 8.8* 9.3*  HCT 37.1 36.8 48.0* 35.4* 26.6* 28.2*  MCV 87.3 84.4  --  88.5 85.3 87.6  PLT 169 190  --  151 103* 118*   CBG:  Recent Labs Lab 05/21/15 1657 05/22/15 0846 05/22/15 1252 05/22/15 1710 05/22/15 1812  GLUCAP 92 87 86 63* 94    Recent Results (from the past 240 hour(s))  Culture, blood (routine x 2)     Status: None (Preliminary result)   Collection Time: 05/20/15  5:00 PM  Result Value Ref Range Status   Specimen Description RIGHT ANTECUBITAL  Final   Special Requests IN PEDIATRIC BOTTLE 5CC  Final   Culture   Final    NO GROWTH 2 DAYS Performed at [facility]    Report Status PENDING  Incomplete  Urine culture     Status: Abnormal   Collection Time: 05/20/15  6:08 PM  Result Value Ref Range Status   Specimen Description URINE, CLEAN CATCH  Final   Special Requests NONE   Final   Culture (A)  Final    >=100,000 COLONIES/mL ESCHERICHIA COLI Confirmed Extended Spectrum Beta-Lactamase Producer (ESBL) Performed at [facility]    Report Status 05/23/2015 FINAL  Final   Organism ID, Bacteria ESCHERICHIA COLI (A)  Final      Susceptibility   Escherichia coli - MIC*    AMPICILLIN >=32 RESISTANT Resistant     CEFAZOLIN >=64 RESISTANT Resistant     CEFTRIAXONE >=64 RESISTANT Resistant     CIPROFLOXACIN >=4 RESISTANT Resistant     GENTAMICIN <=1 SENSITIVE Sensitive     IMIPENEM <=0.25 SENSITIVE Sensitive     NITROFURANTOIN <=16 SENSITIVE Sensitive     TRIMETH/SULFA >=320 RESISTANT Resistant     AMPICILLIN/SULBACTAM 8 SENSITIVE Sensitive     PIP/TAZO <=4 SENSITIVE Sensitive     * >=100,000 COLONIES/mL ESCHERICHIA COLI  MRSA PCR Screening     Status: None   Collection Time: 05/21/15 12:22 AM  Result Value Ref Range Status   MRSA by PCR NEGATIVE NEGATIVE Final    Comment:        The GeneXpert MRSA Assay (FDA approved for NASAL specimens only), is one component of a comprehensive MRSA colonization surveillance program. It is not intended to diagnose MRSA infection nor to guide or monitor treatment for MRSA infections.   Culture, blood (routine x 2)     Status: None (Preliminary result)   Collection Time: 05/21/15  3:25 AM  Result Value Ref Range Status   Specimen Description BLOOD LEFT ARM  Final   Special Requests BOTTLES DRAWN AEROBIC ONLY 5CC  Final   Culture   Final    NO GROWTH 1 DAY Performed at [facility]    Report Status PENDING  Incomplete     Studies: Dg Chest 2 View  05/22/2015  CLINICAL DATA:  Hx of leukemia, sepsis, increased weakness, no other chest complaints today EXAM: CHEST - 2 VIEW COMPARISON:  05/20/2015 and previous FINDINGS: Patchy coarse opacities in the posterior left lower lobe. Somewhat coarse perihilar bronchovascular markings bilaterally as before. No overt edema. Heart size normal. No effusion. No  pneumothorax. Right IJ dual-lumen port catheter to the cavoatrial junction. Visualized skeletal structures are unremarkable. IMPRESSION: 1. Patchy posterior left lower lobe atelectasis, infiltrate or scarring. Electronically Signed   By: Lucrezia Europe M.D.   On: 05/22/2015 10:57   Nm Hepatobiliary Including Gb  05/21/2015  CLINICAL DATA:  Gallbladder wall thickening and sepsis. Evaluate for cholecystitis. EXAM: NUCLEAR MEDICINE HEPATOBILIARY IMAGING TECHNIQUE: Sequential images of the abdomen were obtained out to 60 minutes following intravenous administration of radiopharmaceutical. RADIOPHARMACEUTICALS:  4.9  mCi Tc-26m  Choletec IV COMPARISON:  CT 1 day prior neck. FINDINGS: Prompt uptake and biliary excretion of activity by the liver is seen. Gallbladder activity is visualized, consistent with patency of cystic duct. Biliary activity passes into small bowel, consistent with patent common bile duct. IMPRESSION: No evidence of acute cholecystitis or cystic duct obstruction. Electronically Signed   By: Abigail Miyamoto M.D.   On: 05/21/2015 13:23    Scheduled Meds: . acyclovir  800 mg Oral Daily  . antiseptic oral rinse  7 mL Mouth Rinse q12n4p  . chlorhexidine  15 mL Mouth Rinse BID  . entecavir  0.5 mg Oral Daily  . magnesium sulfate 1 - 4 g bolus IVPB  2 g Intravenous Once  . multivitamin with minerals  1 tablet Oral Daily  . pantoprazole  40 mg Oral QHS  . piperacillin-tazobactam (ZOSYN)  IV  3.375 g Intravenous Q8H  . potassium chloride  40 mEq Oral Q4H  . sulfamethoxazole-trimethoprim  1 tablet Oral Q M,W,F   Continuous Infusions: . sodium chloride 100 mL/hr at 05/23/15 0208    Time spent: 35 min   Barton Dubois  [facility] Pager [phone]. If 7PM-7AM, please contact night-coverage at [email or web address], password [email or web address] 05/23/2015, 10:29 AM  LOS: 3 days

## 2015-05-23 NOTE — Progress Notes (Signed)
Pharmacy Antibiotic Note  Sheryl [name] is a 45 y.o. female admitted on 05/20/2015 with sepsis.  She is currently being treated for leukemia and reports daughter that she lives with had flu last week. Pharmacy has been consulted for Vancomycin & Zosyn dosing. Source of infection is UTI - positive for [facility]  05/23/2015:  Tm 101.3  WBC improved, WNL  Scr at baseline.  Estimated CrCl 73  Ecoli UTI - resistant to amp, Ancef, Rocephin, Cipro and Bactrim  Plan: 1) Continue Zosyn EI q8 2) Narrow abx's when possible - consider nitrofurantoin or Augmentin per sensitivities  Height: 5\' 3"  (160 cm) Weight: 125 lb 7.1 oz (56.9 kg) IBW/kg (Calculated) : 52.4  Temp (24hrs), Avg:99.8 F (37.7 C), Min:98.6 F (37 C), Max:102.7 F (39.3 C)   Recent Labs Lab 05/16/15 1124 05/16/15 1124 05/20/15 1803 05/20/15 1813 05/20/15 1814 05/20/15 2006 05/21/15 0325 05/22/15 1055 05/23/15 0536  WBC 6.1  --  24.3*  --   --   --  23.2* 9.1 8.1  CREATININE  --  0.6  --  0.80  --   --  0.70 0.49 0.77  LATICACIDVEN  --   --   --   --  2.04* 0.66  --   --   --     Estimated Creatinine Clearance: 72.7 mL/min (by C-G formula based on Cr of 0.77).    No Known Allergies  Antimicrobials this admission: Zosyn 4/16 >>  Vancomycin 4/16 >> 4/18  Dose adjustments this admission:  Microbiology results: 4/16 BCx: ngtd 4/16 UCx: Ecoli    Thank you for allowing pharmacy to be a part of this patient's care.   Adrian Saran, PharmD, BCPS Pager [phone] 05/23/2015 9:45 AM

## 2015-05-23 NOTE — Progress Notes (Signed)
Patients HR has sustained 120's and has increased as high as 140. BP 140/70, 124. MD made aware

## 2015-05-23 NOTE — Progress Notes (Signed)
Key Points: Use following P&T approved IV to PO non-antibiotic change policy.  Description contains the criteria that are approved Note: Policy Excludes:  Esophagectomy patientsPHARMACIST - PHYSICIAN COMMUNICATION DR:   [facility] CONCERNING: IV to Oral Route Change Policy  RECOMMENDATION: This patient is receiving protonix by the intravenous route.  Based on criteria approved by the Pharmacy and Therapeutics Committee, the intravenous medication(s) is/are being converted to the equivalent oral dose form(s).   DESCRIPTION: These criteria include:  The patient is eating (either orally or via tube) and/or has been taking other orally administered medications for a least 24 hours  The patient has no evidence of active gastrointestinal bleeding or impaired GI absorption (gastrectomy, short bowel, patient on TNA or NPO).  If you have questions about this conversion, please contact the Pharmacy Department  []   [phone] )  [facility] []   [phone] )  [facility] Pontes  []   [phone] )  [facility] [x]   [phone] )  [place], [place], [facility] 05/23/2015 9:47 AM

## 2015-05-24 DIAGNOSIS — A4151 Sepsis due to Escherichia coli [E. coli]: Principal | ICD-10-CM

## 2015-05-24 DIAGNOSIS — B181 Chronic viral hepatitis B without delta-agent: Secondary | ICD-10-CM

## 2015-05-24 LAB — BASIC METABOLIC PANEL
Anion gap: 9 (ref 5–15)
CALCIUM: 8.6 mg/dL — AB (ref 8.9–10.3)
CO2: 21 mmol/L — ABNORMAL LOW (ref 22–32)
CREATININE: 0.61 mg/dL (ref 0.44–1.00)
Chloride: 111 mmol/L (ref 101–111)
GFR calc Af Amer: >60 mL/min (ref >60)
Glucose, Bld: 96 mg/dL (ref 65–99)
POTASSIUM: 4.1 mmol/L (ref 3.5–5.1)
SODIUM: 141 mmol/L (ref 135–145)

## 2015-05-24 LAB — MAGNESIUM: MAGNESIUM: 1.8 mg/dL (ref 1.7–2.4)

## 2015-05-24 LAB — GLUCOSE, CAPILLARY
Glucose-Capillary: 105 mg/dL — ABNORMAL HIGH (ref 65–99)
Glucose-Capillary: 84 mg/dL (ref 65–99)

## 2015-05-24 MED ORDER — FOSFOMYCIN TROMETHAMINE 3 G PO PACK: 3.0000 g | PACK | Freq: Once | ORAL | Status: DC

## 2015-05-24 MED ORDER — SODIUM CHLORIDE 0.9% FLUSH: 10.0000 mL | Freq: Two times a day (BID) | INTRAVENOUS | Status: DC

## 2015-05-24 MED ORDER — HEPARIN SOD (PORK) LOCK FLUSH 100 UNIT/ML IV SOLN
500.0000 [IU] | INTRAVENOUS | Status: AC | PRN
  Administered 2015-05-24: 500 [IU]

## 2015-05-24 MED ORDER — FOSFOMYCIN TROMETHAMINE 3 G PO PACK
3.0000 g | PACK | Freq: Once | ORAL | Status: AC
  Administered 2015-05-24: 3 g via ORAL
  Filled 2015-05-24: qty 3

## 2015-05-24 MED ORDER — SODIUM CHLORIDE 0.9% FLUSH: 10.0000 mL | INTRAVENOUS | Status: DC | PRN

## 2015-05-24 NOTE — Discharge Summary (Signed)
Physician Discharge Summary  Sheryl [name] TKW:[id number] DOB: 1970-02-08 DOA: 05/20/2015  PCP: [name], MD  Admit date: 05/20/2015 Discharge date: 05/24/2015  Time spent: 35 minutes  Recommendations for Outpatient Follow-up:  Repeat CBC to follow Platelets, Hgb and WBC's trend Repeat BMET to follow electrolytes and renal function   Discharge Diagnoses:    Sepsis due to ESBL E. 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 Patient discharge home with instructions to follow up with PCP in 10 days and to keep herself well hydrated.  Diet recommendation: regular diet   Filed Weights   05/21/15 1200  Weight: 56.9 kg (125 lb 7.1 oz)    History of present illness:  As per H&P written by Dr. 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 She came with cc of nausea/vomiting and fever. She was admitted at [facility] last week for similar complaint that resolved by discharge. Symptoms returned after discharge and has been worse for the past 2-3 days. She has mild abdominal pain. No chest pain or dyspnea or cough. No dysuria. No wounds or ulcers. She has a port that was placed back in Sep. She feels weak and fatigue and had sweating. No diarrhea. No other complaints. History was taken from ER physician through interpreter and from patient with her family help as she speaks almost no [place]. She was seen in Oncology clinic 4 days ago and was complaining of fatigue and poor appetite.  Hospital Course:  1. Sepsis: due to E. Coli UTI -Present on admission, evidenced by a white count of 24,300, heart rate of 150, temperature 103.1, lactic acid of 2.04 -The source of infection could be from urinary  tract. Although CT scan showed nephrolithiasis radiology reported they were nonobstructing and there was no evidence of hydronephrosis. -CT scan did mention mild thickening of the gallbladder. This was further worked up with HIDA scan which not show evidence of acute cholecystitis. -sepsis features resolved at discharge -antibiotic therapy as recommended by ID  2. E. Coli (ESBL) Urinary tract infection -She presents with sepsis criteria, urine showing the presence of many bacteria, nitrates and leukocytes. -CT scan of abdomen and pelvis did not show evidence of obstructive uropathy. -Urine cultures grew E. Coli (ESBL), which was sensitive to Zosyn -case discussed with ID and recommended 2 doses of fosfomycin 3 days apart to consolidate antibiotic therapy -patient received 5 days of IV zosyn while inpatient   3. Nausea/vomiting. -There were no CT evidence of obstruction or other acute findings to explain nausea/vomiting. -presumed to be related to sepsis/critical illness/UTI -advised to keep herself well hydrated and to use PRN antiemetics -tolerating diet and medications at discharge  4. 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 -She remains on acyclovir and Bactrim M-W-F (for prophylaxis)  5. Anemia/thrombocytopenia -A.m. lab work showing hemoglobin of 8.8, previously 11.6. I also noted platelets trending down to 103 from 151 previously. I suspect there may be hemodilutional component to this as she has received aggressive IV fluid resuscitation for sepsis.  -No evidence of GI bleed -will  recommend follow of CBC to re-assess Hgb and platelets trend  6. History of chronic HBV -continue entecavir  7. Hypokalemia -repleted and WNL at discharge -will recommend to repeat BMET during follow up visit   8. GERD -continue PPI   Procedures:  See below  for x-ray reports   Consultations:  ID curbside (Dr. Linus Salmons)  Discharge Exam: Filed Vitals:   05/23/15 2025 05/24/15 0420  BP: 107/73 122/75  Pulse: 72 76  Temp: 98.6 F (37 C) 98.6 F (37 C)  Resp: 20 18   2. General: She looks much better today, nontoxic appearing and afebrile for 36 hours now. No CP, no nausea, no vomiting. Tolerating diet and medications. 3. Cardiovascular: regular rate and rhythm, normal S1-S2, no rubs, No gallops 4. Respiratory: Normal respiratory effort lungs are clear 5. Abdomen: Soft nontender nondistended 6. Musculoskeletal: No edema   Discharge Instructions   Discharge Instructions    Discharge instructions    Complete by:  As directed   Keep yourself well hydrated Take medications as prescribed Follow up with PCP in 10 days          Current Discharge Medication List    START taking these medications   Details  fosfomycin (MONUROL) 3 g PACK Take 3 g by mouth once. Qty: 3 g, Refills: 0      CONTINUE these medications which have NOT CHANGED   Details  entecavir (BARACLUDE) 0.5 MG tablet Take 0.5 mg by mouth daily.     Magnesium Cl-Calcium Carbonate (SLOW MAGNESIUM/CALCIUM) 70-117 MG TBEC Take 64 mg by mouth 2 (two) times daily.    Multiple Vitamins-Minerals (MULTIVITAMIN ADULT PO) Take 1 tablet by mouth daily.    potassium chloride (MICRO-K) 10 MEQ CR capsule Take 20 mEq by mouth 2 (two) times daily.    traMADol (ULTRAM) 50 MG tablet Take 1 tablet (50 mg total) by mouth every 6 (six) hours as needed. 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 Qty: 60 tablet, Refills: 3   Associated Diagnoses: 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HCC)    dexamethasone (DECADRON) 4 MG tablet Take 10 tablets (40 mg total) by mouth once a week. On the day of chemo treatment Qty: 40 tablet, Refills: 2     lidocaine-prilocaine (EMLA) cream Apply 1 application topically as needed. Qty: 30 g, Refills: 6    omeprazole (PRILOSEC) 20 MG capsule Take 1 capsule (20 mg total) by mouth daily. Qty: 30 capsule, Refills: 2    polyethylene glycol (MIRALAX / GLYCOLAX) packet Take 17 g by mouth as needed for moderate constipation. Qty: 28 each, Refills: 1    sulfamethoxazole-trimethoprim (BACTRIM DS,SEPTRA DS) 800-160 MG tablet Take 1 tablet by mouth every Monday, Wednesday, and Friday.       No Known Allergies Follow-up Information    Follow up with [name], MD. Schedule an appointment as soon as possible for a visit in 10 days.   Specialty:  Family Medicine   Contact information:   [address] [place] [address] [phone]       The results of significant diagnostics from this hospitalization (including imaging, microbiology, ancillary and laboratory) are listed below for reference.    Significant Diagnostic Studies: Dg Chest 2 View  05/22/2015  CLINICAL DATA:  Hx of leukemia, sepsis, increased weakness, no other chest complaints today EXAM:  CHEST - 2 VIEW COMPARISON:  05/20/2015 and previous FINDINGS: Patchy coarse opacities in the posterior left lower lobe. Somewhat coarse perihilar bronchovascular markings bilaterally as before. No overt edema. Heart size normal. No effusion. No pneumothorax. Right IJ dual-lumen port catheter to the cavoatrial junction. Visualized skeletal structures are unremarkable. IMPRESSION: 1. Patchy posterior left lower lobe atelectasis, infiltrate or scarring. Electronically Signed   By: Lucrezia Europe M.D.   On: 05/22/2015 10:57   Dg Chest 2 View  05/20/2015  CLINICAL DATA:  Verify Port-A-Cath placement EXAM: CHEST  2 VIEW COMPARISON:  12/01/2014 chest radiograph. FINDINGS: Right internal jugular Port-A-Cath terminates at the cavoatrial junction. Stable cardiomediastinal silhouette with normal heart size. No pneumothorax. No pleural effusion. Lungs  appear clear, with no acute consolidative airspace disease and no pulmonary edema. Stable benign bone island in the right inferior scapula as seen 10/23/2008 chest CT. IMPRESSION: No active cardiopulmonary disease. Right internal jugular Port-A-Cath terminates at the cavoatrial junction. Electronically Signed   By: Ilona Sorrel M.D.   On: 05/20/2015 17:42   Nm Hepatobiliary Including Gb  05/21/2015  CLINICAL DATA:  Gallbladder wall thickening and sepsis. Evaluate for cholecystitis. EXAM: NUCLEAR MEDICINE HEPATOBILIARY IMAGING TECHNIQUE: Sequential images of the abdomen were obtained out to 60 minutes following intravenous administration of radiopharmaceutical. RADIOPHARMACEUTICALS:  4.9  mCi Tc-63m Choletec IV COMPARISON:  CT 1 day prior neck. FINDINGS: Prompt uptake and biliary excretion of activity by the liver is seen. Gallbladder activity is visualized, consistent with patency of cystic duct. Biliary activity passes into small bowel, consistent with patent common bile duct. IMPRESSION: No evidence of acute cholecystitis or cystic duct obstruction. Electronically Signed   By: KAbigail MiyamotoM.D.   On: 05/21/2015 13:23   Ct Abdomen Pelvis W Contrast  05/20/2015  CLINICAL DATA:  Vomiting and weakness since yesterday. History of leukemia. EXAM: CT ABDOMEN AND PELVIS WITH CONTRAST TECHNIQUE: Multidetector CT imaging of the abdomen and pelvis was performed using the standard protocol following bolus administration of intravenous contrast. CONTRAST:  1038mISOVUE-300 IOPAMIDOL (ISOVUE-300) INJECTION 61% COMPARISON:  09/14/2014 FINDINGS: Sub cm low-attenuation lesion in the right lobe of the liver is unchanged since previous study. Too small to characterize but likely represents a cyst. No new liver lesions identified. There is mild thickening of the gallbladder wall without stone or infiltration demonstrated. Gallbladder inflammation is not excluded. No bile duct dilatation. Pancreas is unremarkable. Spleen size  is normal. There is a mass extending lateral and posterior to the spleen which appears to be a septated cystic lesion. The lesion measures up to about 2 x 8 cm. Appearance is unchanged since previous study. Adrenal glands are unremarkable. Multiple parenchymal cysts in the kidneys. No hydronephrosis. 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 No change. Normal caliber aorta and inferior vena cava. No significant retroperitoneal lymphadenopathy. Stomach, small bowel, and colon are not abnormally distended. No free air or free fluid in the abdomen. Pelvis: Mild diffuse thickening of bladder wall may indicate cystitis. Heterogeneous appearance of the uterus clear representing fibroids. No abnormal adnexal masses. No free fluid in the pelvis. Appendix is not definitively identified. Soft tissue calcifications over the gluteal regions likely representing dystrophic or injection granulomas. Benign-appearing sclerosis in the right acetabulum and right sacrum. No destructive bone lesions. Lucencies over the upper iliac bones likely representing bone marrow biopsy sites. IMPRESSION: Suggestion of mild thickening of the gallbladder wall and bladder possibly inflammatory. No other potential acute changes since previous  study. Nonobstructing stones in the kidneys. Multiloculated cystic appearing mass in the left upper quadrant adjacent to the spleen is unchanged. Probable uterine fibroids. No findings to suggest bowel obstruction or inflammation. Electronically Signed   By: Lucienne Capers M.D.   On: 05/20/2015 21:38    Microbiology: Recent Results (from the past 240 hour(s))  Culture, blood (routine x 2)     Status: None (Preliminary result)   Collection Time: 05/20/15  5:00 PM  Result Value Ref Range Status   Specimen Description RIGHT ANTECUBITAL  Final   Special Requests IN PEDIATRIC BOTTLE 5CC  Final   Culture   Final    NO GROWTH 3 DAYS Performed at [facility]    Report Status PENDING  Incomplete  Urine culture     Status: Abnormal   Collection Time: 05/20/15  6:08 PM  Result Value Ref Range Status   Specimen Description URINE, CLEAN CATCH  Final   Special Requests NONE  Final   Culture (A)  Final    >=100,000 COLONIES/mL ESCHERICHIA COLI Confirmed Extended Spectrum Beta-Lactamase Producer (ESBL) Performed at [facility]    Report Status 05/23/2015 FINAL  Final   Organism ID, Bacteria ESCHERICHIA COLI (A)  Final      Susceptibility   Escherichia coli - MIC*    AMPICILLIN >=32 RESISTANT Resistant     CEFAZOLIN >=64 RESISTANT Resistant     CEFTRIAXONE >=64 RESISTANT Resistant     CIPROFLOXACIN >=4 RESISTANT Resistant     GENTAMICIN <=1 SENSITIVE Sensitive     IMIPENEM <=0.25 SENSITIVE Sensitive     NITROFURANTOIN <=16 SENSITIVE Sensitive     TRIMETH/SULFA >=320 RESISTANT Resistant     AMPICILLIN/SULBACTAM 8 SENSITIVE Sensitive     PIP/TAZO <=4 SENSITIVE Sensitive     * >=100,000 COLONIES/mL ESCHERICHIA COLI  MRSA PCR Screening     Status: None   Collection Time: 05/21/15 12:22 AM  Result Value Ref Range Status   MRSA by PCR NEGATIVE NEGATIVE Final    Comment:        The GeneXpert MRSA Assay (FDA approved for NASAL specimens only), is one component of a comprehensive MRSA colonization surveillance program. It is not intended to diagnose MRSA infection nor to guide or monitor treatment for MRSA infections.   Culture, blood (routine x 2)     Status: None (Preliminary result)   Collection Time: 05/21/15  3:25 AM  Result Value Ref Range Status   Specimen Description BLOOD LEFT ARM  Final   Special Requests BOTTLES DRAWN AEROBIC ONLY 5CC  Final   Culture   Final    NO GROWTH 2 DAYS Performed at [facility]    Report Status PENDING  Incomplete     Labs: Basic Metabolic Panel:  Recent Labs Lab 05/20/15 1813 05/21/15 0325 05/22/15 1055 05/23/15 0536 05/24/15 0535  NA 134* 136 135 141 141  K  5.3* 3.6 2.7* 2.9* 4.1  CL 99* 106 107 111 111  CO2  --  '22 22 22 ' 21*  GLUCOSE 113* 123* 110* 122* 96  BUN '18 10 6 ' 5* <5*  CREATININE 0.80 0.70 0.49 0.77 0.61  CALCIUM  --  8.8* 8.1* 8.6* 8.6*  MG  --  1.8  --   --  1.8  PHOS  --  2.3*  --   --   --    Liver Function Tests:  Recent Labs Lab 05/21/15 0325  AST 33  ALT 11*  ALKPHOS 148*  BILITOT 0.6  PROT 6.9  ALBUMIN 3.5    Recent Labs Lab 05/20/15 1803  LIPASE 15   CBC:  Recent Labs Lab 05/20/15 1803 05/20/15 1813 05/21/15 0325 05/22/15 1055 05/23/15 0536  WBC 24.3*  --  23.2* 9.1 8.1  NEUTROABS 19.0*  --  19.3*  --   --   HGB 12.8 16.3* 11.6* 8.8* 9.3*  HCT 36.8 48.0* 35.4* 26.6* 28.2*  MCV 84.4  --  88.5 85.3 87.6  PLT 190  --  151 103* 118*   CBG:  Recent Labs Lab 05/23/15 0800 05/23/15 1216 05/23/15 1702 05/24/15 0626 05/24/15 1225  GLUCAP 84 104* 120* 105* 84     Signed:  Barton Dubois MD.  [facility] 05/24/2015, 12:34 PM

## 2015-05-24 NOTE — Progress Notes (Signed)
Patient given discharge, follow up and medication instructions, verbalized understanding, telemetry removed, Port deaccessed per IV team , family to transport home.

## 2015-05-25 LAB — CULTURE, BLOOD (ROUTINE X 2): Culture: NO GROWTH

## 2015-05-26 LAB — CULTURE, BLOOD (ROUTINE X 2): Culture: NO GROWTH

## 2015-05-30 ENCOUNTER — Other Ambulatory Visit (HOSPITAL_BASED_OUTPATIENT_CLINIC_OR_DEPARTMENT_OTHER): Payer: BLUE CROSS/BLUE SHIELD

## 2015-05-30 ENCOUNTER — Ambulatory Visit (HOSPITAL_BASED_OUTPATIENT_CLINIC_OR_DEPARTMENT_OTHER): Payer: BLUE CROSS/BLUE SHIELD

## 2015-05-30 ENCOUNTER — Other Ambulatory Visit: Payer: Self-pay

## 2015-05-30 DIAGNOSIS — Z95828 Presence of other vascular implants and grafts: Secondary | ICD-10-CM

## 2015-05-30 DIAGNOSIS — C9010 Plasma cell leukemia not having achieved remission: Secondary | ICD-10-CM

## 2015-05-30 DIAGNOSIS — C9011 Plasma cell leukemia in remission: Secondary | ICD-10-CM | POA: Diagnosis not present

## 2015-05-30 LAB — CBC WITH DIFFERENTIAL/PLATELET
BASO%: 0.7 % (ref 0.0–2.0)
BASOS ABS: 0 10*3/uL (ref 0.0–0.1)
EOS ABS: 0.1 10*3/uL (ref 0.0–0.5)
EOS%: 1.4 % (ref 0.0–7.0)
HCT: 34.1 % — ABNORMAL LOW (ref 34.8–46.6)
HGB: 11 g/dL — ABNORMAL LOW (ref 11.6–15.9)
LYMPH%: 20.5 % (ref 14.0–49.7)
MCH: 28.3 pg (ref 25.1–34.0)
MCHC: 32.4 g/dL (ref 31.5–36.0)
MCV: 87.5 fL (ref 79.5–101.0)
MONO#: 0.8 10*3/uL (ref 0.1–0.9)
MONO%: 11.2 % (ref 0.0–14.0)
NEUT%: 66.2 % (ref 38.4–76.8)
NEUTROS ABS: 4.8 10*3/uL (ref 1.5–6.5)
PLATELETS: 337 10*3/uL (ref 145–400)
RBC: 3.89 10*6/uL (ref 3.70–5.45)
RDW: 19.8 % — ABNORMAL HIGH (ref 11.2–14.5)
WBC: 7.2 10*3/uL (ref 3.9–10.3)
lymph#: 1.5 10*3/uL (ref 0.9–3.3)

## 2015-05-30 LAB — COMPREHENSIVE METABOLIC PANEL
ALT: <9 U/L (ref 0–55)
ANION GAP: 8 meq/L (ref 3–11)
AST: 22 U/L (ref 5–34)
Albumin: 3.3 g/dL — ABNORMAL LOW (ref 3.5–5.0)
Alkaline Phosphatase: 161 U/L — ABNORMAL HIGH (ref 40–150)
BUN: 9.4 mg/dL (ref 7.0–26.0)
CO2: 26 meq/L (ref 22–29)
Calcium: 9.8 mg/dL (ref 8.4–10.4)
Chloride: 104 mEq/L (ref 98–109)
Creatinine: 0.6 mg/dL (ref 0.6–1.1)
GLUCOSE: 125 mg/dL (ref 70–140)
POTASSIUM: 3.7 meq/L (ref 3.5–5.1)
SODIUM: 138 meq/L (ref 136–145)
TOTAL PROTEIN: 7.3 g/dL (ref 6.4–8.3)

## 2015-05-30 LAB — MAGNESIUM: Magnesium: 1.7 mg/dl (ref 1.5–2.5)

## 2015-05-30 MED ORDER — SODIUM CHLORIDE 0.9 % IJ SOLN
10.0000 mL | INTRAMUSCULAR | Status: DC | PRN
  Administered 2015-05-30: 10 mL via INTRAVENOUS
  Filled 2015-05-30: qty 10

## 2015-05-30 MED ORDER — HEPARIN SOD (PORK) LOCK FLUSH 100 UNIT/ML IV SOLN
500.0000 [IU] | Freq: Once | INTRAVENOUS | Status: AC | PRN
  Administered 2015-05-30: 500 [IU] via INTRAVENOUS
  Filled 2015-05-30: qty 5

## 2015-05-30 NOTE — Patient Instructions (Signed)

## 2015-06-06 ENCOUNTER — Ambulatory Visit (HOSPITAL_BASED_OUTPATIENT_CLINIC_OR_DEPARTMENT_OTHER): Payer: BLUE CROSS/BLUE SHIELD

## 2015-06-06 ENCOUNTER — Other Ambulatory Visit (HOSPITAL_BASED_OUTPATIENT_CLINIC_OR_DEPARTMENT_OTHER): Payer: BLUE CROSS/BLUE SHIELD

## 2015-06-06 DIAGNOSIS — C9011 Plasma cell leukemia in remission: Secondary | ICD-10-CM

## 2015-06-06 DIAGNOSIS — C9010 Plasma cell leukemia not having achieved remission: Secondary | ICD-10-CM

## 2015-06-06 DIAGNOSIS — Z95828 Presence of other vascular implants and grafts: Secondary | ICD-10-CM

## 2015-06-06 LAB — CBC WITH DIFFERENTIAL/PLATELET
BASO%: 0.4 % (ref 0.0–2.0)
BASOS ABS: 0 10*3/uL (ref 0.0–0.1)
EOS ABS: 0.1 10*3/uL (ref 0.0–0.5)
EOS%: 1 % (ref 0.0–7.0)
HCT: 31.6 % — ABNORMAL LOW (ref 34.8–46.6)
HEMOGLOBIN: 10.2 g/dL — AB (ref 11.6–15.9)
LYMPH%: 17.3 % (ref 14.0–49.7)
MCH: 28.6 pg (ref 25.1–34.0)
MCHC: 32.4 g/dL (ref 31.5–36.0)
MCV: 88.2 fL (ref 79.5–101.0)
MONO#: 0.7 10*3/uL (ref 0.1–0.9)
MONO%: 10.7 % (ref 0.0–14.0)
NEUT#: 4.5 10*3/uL (ref 1.5–6.5)
NEUT%: 70.6 % (ref 38.4–76.8)
Platelets: 228 10*3/uL (ref 145–400)
RBC: 3.58 10*6/uL — ABNORMAL LOW (ref 3.70–5.45)
RDW: 19.6 % — ABNORMAL HIGH (ref 11.2–14.5)
WBC: 6.4 10*3/uL (ref 3.9–10.3)
lymph#: 1.1 10*3/uL (ref 0.9–3.3)

## 2015-06-06 LAB — COMPREHENSIVE METABOLIC PANEL
ALBUMIN: 3.2 g/dL — AB (ref 3.5–5.0)
ALK PHOS: 211 U/L — AB (ref 40–150)
ALT: 12 U/L (ref 0–55)
AST: 34 U/L (ref 5–34)
Anion Gap: 8 mEq/L (ref 3–11)
BUN: 7.4 mg/dL (ref 7.0–26.0)
CO2: 28 mEq/L (ref 22–29)
Calcium: 9.6 mg/dL (ref 8.4–10.4)
Chloride: 104 mEq/L (ref 98–109)
Creatinine: 0.7 mg/dL (ref 0.6–1.1)
GLUCOSE: 116 mg/dL (ref 70–140)
POTASSIUM: 3.6 meq/L (ref 3.5–5.1)
SODIUM: 140 meq/L (ref 136–145)
Total Bilirubin: 0.31 mg/dL (ref 0.20–1.20)
Total Protein: 6.8 g/dL (ref 6.4–8.3)

## 2015-06-06 LAB — MAGNESIUM: MAGNESIUM: 1.8 mg/dL (ref 1.5–2.5)

## 2015-06-06 MED ORDER — SODIUM CHLORIDE 0.9 % IJ SOLN
10.0000 mL | INTRAMUSCULAR | Status: DC | PRN
  Administered 2015-06-06: 10 mL via INTRAVENOUS
  Filled 2015-06-06: qty 10

## 2015-06-06 MED ORDER — HEPARIN SOD (PORK) LOCK FLUSH 100 UNIT/ML IV SOLN
500.0000 [IU] | Freq: Once | INTRAVENOUS | Status: AC | PRN
  Administered 2015-06-06: 500 [IU] via INTRAVENOUS
  Filled 2015-06-06: qty 5

## 2015-06-06 NOTE — Patient Instructions (Signed)

## 2015-06-13 ENCOUNTER — Ambulatory Visit (HOSPITAL_BASED_OUTPATIENT_CLINIC_OR_DEPARTMENT_OTHER): Payer: BLUE CROSS/BLUE SHIELD | Admitting: Hematology

## 2015-06-13 ENCOUNTER — Other Ambulatory Visit (HOSPITAL_BASED_OUTPATIENT_CLINIC_OR_DEPARTMENT_OTHER): Payer: BLUE CROSS/BLUE SHIELD

## 2015-06-13 ENCOUNTER — Telehealth: Payer: Self-pay | Admitting: Hematology

## 2015-06-13 ENCOUNTER — Encounter: Payer: Self-pay | Admitting: Hematology

## 2015-06-13 ENCOUNTER — Ambulatory Visit (HOSPITAL_BASED_OUTPATIENT_CLINIC_OR_DEPARTMENT_OTHER): Payer: BLUE CROSS/BLUE SHIELD

## 2015-06-13 VITALS — BP 103/73 | HR 100 | Temp 98.3°F | Resp 18 | Wt 121.1 lb

## 2015-06-13 DIAGNOSIS — R350 Frequency of micturition: Secondary | ICD-10-CM | POA: Diagnosis not present

## 2015-06-13 DIAGNOSIS — B181 Chronic viral hepatitis B without delta-agent: Secondary | ICD-10-CM

## 2015-06-13 DIAGNOSIS — C9011 Plasma cell leukemia in remission: Secondary | ICD-10-CM

## 2015-06-13 DIAGNOSIS — N390 Urinary tract infection, site not specified: Secondary | ICD-10-CM

## 2015-06-13 DIAGNOSIS — R5383 Other fatigue: Secondary | ICD-10-CM

## 2015-06-13 DIAGNOSIS — Z95828 Presence of other vascular implants and grafts: Secondary | ICD-10-CM

## 2015-06-13 DIAGNOSIS — D649 Anemia, unspecified: Secondary | ICD-10-CM

## 2015-06-13 DIAGNOSIS — E876 Hypokalemia: Secondary | ICD-10-CM | POA: Diagnosis not present

## 2015-06-13 DIAGNOSIS — M791 Myalgia: Secondary | ICD-10-CM

## 2015-06-13 DIAGNOSIS — C9010 Plasma cell leukemia not having achieved remission: Secondary | ICD-10-CM

## 2015-06-13 LAB — CBC WITH DIFFERENTIAL/PLATELET
BASO%: 0.2 % (ref 0.0–2.0)
BASOS ABS: 0 10*3/uL (ref 0.0–0.1)
EOS ABS: 0 10*3/uL (ref 0.0–0.5)
EOS%: 0.6 % (ref 0.0–7.0)
HEMATOCRIT: 32 % — AB (ref 34.8–46.6)
HGB: 10.4 g/dL — ABNORMAL LOW (ref 11.6–15.9)
LYMPH%: 20.7 % (ref 14.0–49.7)
MCH: 29.5 pg (ref 25.1–34.0)
MCHC: 32.5 g/dL (ref 31.5–36.0)
MCV: 90.9 fL (ref 79.5–101.0)
MONO#: 0.6 10*3/uL (ref 0.1–0.9)
MONO%: 9.6 % (ref 0.0–14.0)
NEUT#: 4.3 10*3/uL (ref 1.5–6.5)
NEUT%: 68.9 % (ref 38.4–76.8)
PLATELETS: 197 10*3/uL (ref 145–400)
RBC: 3.52 10*6/uL — AB (ref 3.70–5.45)
RDW: 17.7 % — ABNORMAL HIGH (ref 11.2–14.5)
WBC: 6.2 10*3/uL (ref 3.9–10.3)
lymph#: 1.3 10*3/uL (ref 0.9–3.3)

## 2015-06-13 LAB — URINALYSIS, MICROSCOPIC - CHCC
Bilirubin (Urine): NEGATIVE
Glucose: NEGATIVE mg/dL
Ketones: NEGATIVE mg/dL
NITRITE: POSITIVE
PH: 6 (ref 4.6–8.0)
Protein: NEGATIVE mg/dL
Specific Gravity, Urine: 1.01 (ref 1.003–1.035)
UROBILINOGEN UR: 0.2 mg/dL (ref 0.2–1)

## 2015-06-13 LAB — COMPREHENSIVE METABOLIC PANEL
ALT: 14 U/L (ref 0–55)
ANION GAP: 7 meq/L (ref 3–11)
AST: 43 U/L — ABNORMAL HIGH (ref 5–34)
Albumin: 3.3 g/dL — ABNORMAL LOW (ref 3.5–5.0)
Alkaline Phosphatase: 244 U/L — ABNORMAL HIGH (ref 40–150)
BILIRUBIN TOTAL: 0.3 mg/dL (ref 0.20–1.20)
BUN: 9 mg/dL (ref 7.0–26.0)
CALCIUM: 9.8 mg/dL (ref 8.4–10.4)
CHLORIDE: 106 meq/L (ref 98–109)
CO2: 28 mEq/L (ref 22–29)
Creatinine: 0.6 mg/dL (ref 0.6–1.1)
Glucose: 95 mg/dl (ref 70–140)
Potassium: 3.3 mEq/L — ABNORMAL LOW (ref 3.5–5.1)
Sodium: 141 mEq/L (ref 136–145)
Total Protein: 6.9 g/dL (ref 6.4–8.3)

## 2015-06-13 LAB — MAGNESIUM: MAGNESIUM: 1.6 mg/dL (ref 1.5–2.5)

## 2015-06-13 MED ORDER — OMEPRAZOLE 20 MG PO CPDR: 20.0000 mg | DELAYED_RELEASE_CAPSULE | Freq: Every day | ORAL | Status: DC

## 2015-06-13 MED ORDER — TRAMADOL HCL 50 MG PO TABS: 50.0000 mg | ORAL_TABLET | Freq: Four times a day (QID) | ORAL | Status: DC | PRN

## 2015-06-13 MED ORDER — POTASSIUM CHLORIDE ER 10 MEQ PO CPCR: 20.0000 meq | ORAL_CAPSULE | Freq: Two times a day (BID) | ORAL | Status: DC

## 2015-06-13 MED ORDER — ACYCLOVIR 400 MG PO TABS: 400.0000 mg | ORAL_TABLET | Freq: Two times a day (BID) | ORAL | Status: DC

## 2015-06-13 MED ORDER — HEPARIN SOD (PORK) LOCK FLUSH 100 UNIT/ML IV SOLN
500.0000 [IU] | Freq: Once | INTRAVENOUS | Status: AC | PRN
  Administered 2015-06-13: 500 [IU] via INTRAVENOUS
  Filled 2015-06-13: qty 5

## 2015-06-13 MED ORDER — LIDOCAINE-PRILOCAINE 2.5-2.5 % EX CREA: 1.0000 "application " | TOPICAL_CREAM | CUTANEOUS | Status: DC | PRN

## 2015-06-13 MED ORDER — POLYETHYLENE GLYCOL 3350 17 G PO PACK: 17.0000 g | PACK | ORAL | Status: DC | PRN

## 2015-06-13 MED ORDER — SODIUM CHLORIDE 0.9 % IJ SOLN
10.0000 mL | INTRAMUSCULAR | Status: DC | PRN
  Administered 2015-06-13: 10 mL via INTRAVENOUS
  Filled 2015-06-13: qty 10

## 2015-06-13 MED ORDER — SULFAMETHOXAZOLE-TRIMETHOPRIM 800-160 MG PO TABS: 1.0000 | ORAL_TABLET | ORAL | Status: DC

## 2015-06-13 NOTE — Progress Notes (Addendum)
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   10/10/2014 Miscellaneous Peripheral blood chemistry and leukocytosis with total white count 78K, comprised of large plasma cells and his normocytic anemia. There is a myeloid left shift with previous surgical radium blasts. Flow cytometry showed 64% plasma cells   10/10/2014 Bone Marrow Biopsy Markedly hypercellular marrow (95%), Atypical plasma cells comprise 57% of the cellularity. There was diminished multilineage in hematopoiesis with adequate maturation. Breasts less than 1%), no overt dysplasia of the myeloid or erythroid lineages.    10/10/2014 Initial Diagnosis Plasma cell leukemia   10/13/2014 -  Chemotherapy CyborD (cytoxan 335m/m2 iv, bortezomib 1.5 mg/m, dexamethasone 40 mg, weekly every 28 days, bortezomib and dexamethasone was given twice weekly for 2 weeks during the first cycle)   04[facility]04/2015 [facility] course was complicated by sepsis from Escherichia coli bacteremia and associated colitis, she was discharged home on 04/27/2015.   05/07/2015 - 05/12/2015 [facility], [facility] ID workup was negative, EGD showed evidence of gastritis and duodenitis, no H. pylori or CMV.   05/20/2015 - 05/24/2015 [facility].    HISTORY OF PRESENTING ILLNESS:  Sheryl [name] 45y.o. female is here because of recently diagnosed plasma cell leukemia. [facility] and is here to establish her local oncological care with [place] She [place] a [place] not speak [place] she is accompanied to the clinic by her daughter and interpreter.  She presented to our hospital lth with worsening dyspnea, fatigue, cough, subjective fevers and chills, and was admitted on 09/14/2014. She was found to have allergy WBC 54.1K, hemoglobin 8.1, plt 310, she was treated with symptom management, and was discharged home on 09/17/2014, with a plan to follow-up with hematology. She represents to emergency room on 10/07/2014, was found to have white count of 78K, and worsening anemia with hemoglobin 6. She was seen by my partner Dr. EJonette Evaand plasma cell leukemia was suspected. [facility] Bone marrow biopsy was done, which confirmed plasma cell leukemia, and she started chemotherapy with Velcade, Cytoxan and dexamethasone. She received weekly dose twice, last dose on 10/20/2014. She was discharged to home afterwards. She had a mediport placed during her hospitalization.  She has moderate fatigue, low appetie, she lost about 13 lbs in the past few months. She has moderate abdominal pain, 5-6/10, persistent, she does not take any pain meds.   I reviewed her medical records extensively, and discussed her case with Dr. EJorje Guildand BMenifeeprogram coordinator.  CURRENT THERAPY:  Supportive care  INTERIM HISTORY: Sheryl [name] returns for follow up. [facility] and again he was low Hospital at the end of April for UTI sepsis. She was discharged home  with antibiotics, but she did not fill it due to the high copay. She denies any dysuria, still has  urinary frequency at night. She overall has felt better, with more energy and appetite. She functions well at home. She is scheduled to see Dr. Debbrah Alar in early June.  MEDICAL HISTORY:  Past Medical History  Diagnosis Date  . GERD (gastroesophageal reflux disease)   . Hepatitis   . History of positive PPD     DX 2011--  CXR DONE NO EVIDENCE  . Hydronephrosis, right   . Right ureteral stone   . History of ureter stent   . Chills with fever     intermittently since d/c from hospital  . Dysuria-frequency syndrome     w/ pink urine  . Urosepsis 8/14    admitted to [facility]  . Pneumonia   . Neuromuscular disorder (HCC)     legs numb intermittently    SURGICAL HISTORY: Past Surgical History  Procedure Laterality Date  . Cystoscopy w/ ureteral stent placement Right 09/25/2012    Procedure: CYSTOSCOPY WITH RETROGRADE PYELOGRAM/URETERAL STENT PLACEMENT;  Surgeon: Alexis Frock, MD;  Location: [facility];  Service: Urology;  Laterality: Right;  . Liver biopsy    . Right vats w/ drainage peural effusion and bx's  10-30-2008  . Removal of uterine cyst      years ago  . Other surgical history Right     removal of ovarian cyst  . Cystoscopy with retrograde pyelogram, ureteroscopy and stent placement Right 10/15/2012    Procedure: CYSTOSCOPY WITH RETROGRADE PYELOGRAM, URETEROSCOPY AND REMOVAL STENT WITH  STENT PLACEMENT;  Surgeon: Alexis Frock, MD;  Location[facility];  Service: Urology;  Laterality: Right;  . Holmium laser application Right 9/37/3428    Procedure: HOLMIUM LASER APPLICATION;  Surgeon: Alexis Frock, MD;  Location: [facility];  Service: Urology;  Laterality: Right;  . Esophagogastroduodenoscopy (egd) with propofol N/A 11/16/2014    Procedure: ESOPHAGOGASTRODUODENOSCOPY (EGD) WITH PROPOFOL;  Surgeon: Milus Banister, MD;  Location: [facility];  Service: Endoscopy;  Laterality: N/A;    SOCIAL HISTORY: Social History   Social History  .  Marital Status: Single    Spouse Name: N/A  . Number of Children: 2  . Years of Education: N/A   Occupational History  . She used to work at a [employer]    Social History Main Topics  . Smoking status: Never Smoker   . Smokeless tobacco: Not on file  . Alcohol Use: No  . Drug Use: No  . Sexual Activity: Not Currently    Birth Control/ Protection: Abstinence   Other Topics Concern  . Not on file   Social History Narrative    FAMILY HISTORY: Family History  Problem Relation Age of Onset  . Stomach cancer Mother   . Lung disease Father   . Asthma Father     ALLERGIES:  has No Known Allergies.  MEDICATIONS:  Current Outpatient Prescriptions  Medication Sig Dispense Refill  . acyclovir (ZOVIRAX) 400 MG tablet Take 1 tablet (400 mg total) by mouth 2 (two) times daily. (Patient not taking: Reported on 05/20/2015) 60 tablet 3  . dexamethasone (DECADRON) 4 MG tablet Take 10 tablets (40 mg total) by mouth once a week. On the day of chemo treatment (Patient not taking: Reported on 05/16/2015) 40 tablet 2  . entecavir (BARACLUDE) 0.5 MG tablet Take 0.5 mg by mouth daily.     . fosfomycin (MONUROL) 3 g PACK Take 3 g  by mouth once. 3 g 0  . lidocaine-prilocaine (EMLA) cream Apply 1 application topically as needed. (Patient taking differently: Apply 1 application topically as needed (port). ) 30 g 6  . Magnesium Cl-Calcium Carbonate (SLOW MAGNESIUM/CALCIUM) 70-117 MG TBEC Take 64 mg by mouth 2 (two) times daily.    . Multiple Vitamins-Minerals (MULTIVITAMIN ADULT PO) Take 1 tablet by mouth daily.    Marland Kitchen omeprazole (PRILOSEC) 20 MG capsule Take 1 capsule (20 mg total) by mouth daily. (Patient not taking: Reported on 05/20/2015) 30 capsule 2  . polyethylene glycol (MIRALAX / GLYCOLAX) packet Take 17 g by mouth as needed for moderate constipation. (Patient not taking: Reported on 05/20/2015) 28 each 1  . potassium chloride (MICRO-K) 10 MEQ CR capsule Take 20 mEq by mouth 2 (two) times daily.      Marland Kitchen sulfamethoxazole-trimethoprim (BACTRIM DS,SEPTRA DS) 800-160 MG tablet Take 1 tablet by mouth every Monday, Wednesday, and Friday.    . traMADol (ULTRAM) 50 MG tablet Take 1 tablet (50 mg total) by mouth every 6 (six) hours as needed. 30 tablet 0   No current facility-administered medications for this visit.    REVIEW OF SYSTEMS:   Constitutional: (+) intermittent fevers, no chills or abnormal night sweats Eyes: Denies blurriness of vision, double vision or watery eyes Ears, nose, mouth, throat, and face: Denies mucositis or sore throat Respiratory: Denies cough, dyspnea or wheezes Cardiovascular: Denies palpitation, chest discomfort or lower extremity swelling Gastrointestinal:  Denies nausea, heartburn or change in bowel habits Skin: Denies abnormal skin rashes Lymphatics: Denies new lymphadenopathy or easy bruising Neurological:Denies numbness, tingling or new weaknesses Behavioral/Psych: Mood is stable, no new changes  All other systems were reviewed with the patient and are negative.  PHYSICAL EXAMINATION: ECOG PERFORMANCE STATUS: 1  Filed Vitals:   06/13/15 1103  BP: 103/73  Pulse: 100  Temp: 98.3 F (36.8 C)  Resp: 18   Filed Weights   06/13/15 1103  Weight: 121 lb 1.6 oz (54.931 kg)    GENERAL:alert, no distress and comfortable SKIN: skin color, texture, turgor are normal, no rashes or significant lesions EYES: normal, conjunctiva are pink and non-injected, sclera clear OROPHARYNX:no exudate, no erythema and lips, buccal mucosa, and tongue normal  NECK: supple, thyroid normal size, non-tender, without nodularity LYMPH:  no palpable lymphadenopathy in the cervical, axillary or inguinal LUNGS: clear to auscultation and percussion with normal breathing effort HEART: regular rate & rhythm and no murmurs and no lower extremity edema ABDOMEN:abdomen soft, non-tender and normal bowel sounds Musculoskeletal:no cyanosis of digits and no clubbing  PSYCH: alert &  oriented x 3 with fluent speech NEURO: no focal motor/sensory deficits  LABORATORY DATA:  I have reviewed the data as listed CBC Latest Ref Rng 06/13/2015 06/06/2015 05/30/2015  WBC 3.9 - 10.3 10e3/uL 6.2 6.4 7.2  Hemoglobin 11.6 - 15.9 g/dL 10.4(L) 10.2(L) 11.0(L)  Hematocrit 34.8 - 46.6 % 32.0(L) 31.6(L) 34.1(L)  Platelets 145 - 400 10e3/uL 197 228 337    CMP Latest Ref Rng 06/13/2015 06/06/2015 05/30/2015  Glucose 70 - 140 mg/dl 95 116 125  BUN 7.0 - 26.0 mg/dL 9.0 7.4 9.4  Creatinine 0.6 - 1.1 mg/dL 0.6 0.7 0.6  Sodium 136 - 145 mEq/L 141 140 138  Potassium 3.5 - 5.1 mEq/L 3.3(L) 3.6 3.7  Chloride 101 - 111 mmol/L - - -  CO2 22 - 29 mEq/L '28 28 26  ' Calcium 8.4 - 10.4 mg/dL 9.8 9.6 9.8  Total Protein 6.4 - 8.3 g/dL 6.9 6.8 7.3  Total Bilirubin 0.20 - 1.20 mg/dL 0.30 0.31 <0.30  Alkaline Phos 40 - 150 U/L 244(H) 211(H) 161(H)  AST 5 - 34 U/L 43(H) 34 22  ALT 0 - 55 U/L 14 12 <9     SPEP M-protein  09/15/2014: 4.2 10/08/14: 4.6 12/08/2014: 0.2 02/15/2015: not sufficient sample for test    IgG mg/dl 11/03/2014: 3150  12/08/2014:  759 02/14/2014: 860  Kappa/lambda light chains levels and ration  11/03/14: 0.75, 152, 0.00 12/22/2014: 1.10, 2.60, 0.42 02/15/2015: 9.59,14.35, 0.67  24 h urine UPEP/IFE and light chain: 11/03/2014: IFE showed a monoclonal IgG heavy chain with associated lambda light chain. M protein was Undetectable    PATHOLOGY REPORT  Bone Marrow (BM) and Peripheral Blood (PB) 10/10/2014 [facility])  FINAL PATHOLOGIC DIAGNOSIS      BONE MARROW AND PERIPHERAL BLOOD:  Extensive involvement by plasma cell myeloma/leukemia.  See comment and CBC data.      COMMENT:  The bone marrow aspirate smears are spicular, cellular and  adequate for evaluation. Atypical plasma cells comprise 57% of  the cellularity (500 cell manual differential count). The plasma  cells have enlarged nuclei with scattered binucleated forms and  irregular nuclear contour.  There is diminished multilineage  hematopoiesis with adequate maturation. Blasts are not increased  in numbers (less than 1% by manual differential counts). 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 There is no overt dysplasia of the  myeloid or erythroid lineages. Megakaryocytes are quantitatively  and qualitatively unremarkable. There is no lymphocytosis.    Examination of the bone marrow core biopsy reveals a markedly  hypercellular marrow for age (95%) with near complete replacement  by sheets of plasma cells. Rare myeloid and erythroid elements  are present. Megakaryocytes are sparse and focally clustered with  hypolobated and hyperchromatic nuclei. There are no atypical  lymphoid aggregates or granulomas identified. Examination of the  bone marrow clot section reveals similar findings.    The peripheral blood demonstrates a leukocytosis comprised of  large plasma cells and normocytic anemia. Rouleaux is present.  There is a myeloid left shift with rare circulating blasts.    By flow cytometry on the peripheral blood, the abnormal plasma  cell population comprises 64% of the cellular elements and  expresses cytoplasmic CD138, CD45 and cLambda with dim expression  of CD19. The plasma cells are negative for surface CD2, CD3, CD4,  CD5, CD7, CD8, CD56, CD10, CD20, sKappa, sLambda, HLA-DR and  cKappa.    Overall, the findings demonstrate extensive involvement by plasma  cell myeloma/leukemia.     RADIOGRAPHIC STUDIES: I have personally reviewed the radiological images as listed and agreed with the findings in the report. Dg Chest 2 View  05/22/2015  CLINICAL DATA:  Hx of leukemia, sepsis, increased weakness, no other chest complaints today EXAM: CHEST - 2 VIEW COMPARISON:  05/20/2015 and previous FINDINGS: Patchy coarse opacities in the posterior left lower lobe. Somewhat coarse perihilar  bronchovascular markings bilaterally as before. No overt edema. Heart size normal. No effusion. No pneumothorax. Right IJ dual-lumen port catheter to the cavoatrial junction. Visualized skeletal structures are unremarkable. IMPRESSION: 1. Patchy posterior left lower lobe atelectasis, infiltrate or scarring. Electronically Signed   By: Lucrezia Europe M.D.   On: 05/22/2015 10:57   Dg Chest 2 View  05/20/2015  CLINICAL DATA:  Verify Port-A-Cath placement EXAM: CHEST  2 VIEW COMPARISON:  12/01/2014 chest radiograph. FINDINGS: Right internal jugular Port-A-Cath terminates at the  cavoatrial junction. Stable cardiomediastinal silhouette with normal heart size. No pneumothorax. No pleural effusion. Lungs appear clear, with no acute consolidative airspace disease and no pulmonary edema. Stable benign bone island in the right inferior scapula as seen 10/23/2008 chest CT. IMPRESSION: No active cardiopulmonary disease. Right internal jugular Port-A-Cath terminates at the cavoatrial junction. Electronically Signed   By: Ilona Sorrel M.D.   On: 05/20/2015 17:42   Nm Hepatobiliary Including Gb  05/21/2015  CLINICAL DATA:  Gallbladder wall thickening and sepsis. Evaluate for cholecystitis. EXAM: NUCLEAR MEDICINE HEPATOBILIARY IMAGING TECHNIQUE: Sequential images of the abdomen were obtained out to 60 minutes following intravenous administration of radiopharmaceutical. RADIOPHARMACEUTICALS:  4.9  mCi Tc-63m Choletec IV COMPARISON:  CT 1 day prior neck. FINDINGS: Prompt uptake and biliary excretion of activity by the liver is seen. Gallbladder activity is visualized, consistent with patency of cystic duct. Biliary activity passes into small bowel, consistent with patent common bile duct. IMPRESSION: No evidence of acute cholecystitis or cystic duct obstruction. Electronically Signed   By: KAbigail MiyamotoM.D.   On: 05/21/2015 13:23   Ct Abdomen Pelvis W Contrast  05/20/2015  CLINICAL DATA:  Vomiting and weakness since yesterday.  History of leukemia. EXAM: CT ABDOMEN AND PELVIS WITH CONTRAST TECHNIQUE: Multidetector CT imaging of the abdomen and pelvis was performed using the standard protocol following bolus administration of intravenous contrast. CONTRAST:  1030mISOVUE-300 IOPAMIDOL (ISOVUE-300) INJECTION 61% COMPARISON:  09/14/2014 FINDINGS: Sub cm low-attenuation lesion in the right lobe of the liver is unchanged since previous study. Too small to characterize but likely represents a cyst. No new liver lesions identified. There is mild thickening of the gallbladder wall without stone or infiltration demonstrated. Gallbladder inflammation is not excluded. No bile duct dilatation. Pancreas is unremarkable. Spleen size is normal. There is a mass extending lateral and posterior to the spleen which appears to be a septated cystic lesion. The lesion measures up to about 2 x 8 cm. Appearance is unchanged since previous study. Adrenal glands are unremarkable. Multiple parenchymal cysts in the kidneys. No hydronephrosis. 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 No change. Normal caliber aorta and inferior vena cava. No significant retroperitoneal lymphadenopathy. Stomach, small bowel, and colon are not abnormally distended. No free air or free fluid in the abdomen. Pelvis: Mild diffuse thickening of bladder wall may indicate cystitis. Heterogeneous appearance of the uterus clear representing fibroids. No abnormal adnexal masses. No free fluid in the pelvis. Appendix is not definitively identified. Soft tissue calcifications over the gluteal regions likely representing dystrophic or injection granulomas. Benign-appearing sclerosis in the right acetabulum and right sacrum. No destructive bone lesions. Lucencies over the upper iliac bones likely representing bone marrow biopsy sites. IMPRESSION: Suggestion of mild thickening of the gallbladder wall and bladder possibly inflammatory. No other potential acute  changes since previous study. Nonobstructing stones in the kidneys. Multiloculated cystic appearing mass in the left upper quadrant adjacent to the spleen is unchanged. Probable uterine fibroids. No findings to suggest bowel obstruction or inflammation. Electronically Signed   By: WiLucienne Capers.D.   On: 05/20/2015 21:38    ASSESSMENT & PLAN: [age]ear old [place] with anemia and leokocytosis   1. 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 fatigue and body pain  - secondary to chemotherapy and transplant - I encouraged her to take nutrition supplement and to be physically active  3. Recent episodes of E Coli UTI -She still has some urinary frequency, did not finish the antibiotic course -I'll repeat her UA and urine culture today  4. Chronic Hepatitis B  -continue entecavir,  She will follow-up GI clinic at [facility]  5. Communication and Social support -She does not speak English, she is accompanied by interpreter to our cancer center -She has significant communication barrier, limited social support, and poor health literacy   6. Hypokalemia - She is taking potassium chloride 1 tablet a day, potassium 3.3 today , I suggest her to continue and eats potassium enriched food - Follow CMP weekly  Plan -I refilled all of his meds -repeat UA and urine culture  -I will see her back in mid June after her visit at [place] questions were answered. The patient knows to call the clinic with any problems, questions or concerns. I spent 20 minutes counseling the patient face to face. The total time spent in the appointment was 30 minutes and more than 50% was on counseling.     Truitt Merle, MD 06/13/2015 11:38 AM   Addendum Her  urine culture from 5/10 was positive for E COLI, sensitive to nitrofurantoin, I called in 157m bid for 7 days, and informed pt. I encourage her to call [place] next week if her symptoms improve after antibiotics.  FTruitt Merle 06/15/2015

## 2015-06-13 NOTE — Telephone Encounter (Signed)
Gave adn pritned appt sched and avs for pt for May adn June

## 2015-06-13 NOTE — Patient Instructions (Signed)

## 2015-06-15 ENCOUNTER — Other Ambulatory Visit: Payer: Self-pay | Admitting: Hematology

## 2015-06-15 ENCOUNTER — Telehealth: Payer: Self-pay | Admitting: *Deleted

## 2015-06-15 DIAGNOSIS — N390 Urinary tract infection, site not specified: Secondary | ICD-10-CM

## 2015-06-15 LAB — URINE CULTURE

## 2015-06-15 MED ORDER — NITROFURANTOIN MONOHYD MACRO 100 MG PO CAPS: 100.0000 mg | ORAL_CAPSULE | Freq: Two times a day (BID) | ORAL | Status: DC

## 2015-06-15 NOTE — Telephone Encounter (Signed)
Dr Burr Medico e-scribed ATB & pt's daughter notified to p/u & start.

## 2015-06-15 NOTE — Telephone Encounter (Signed)
Sheryl [name] with [facility], Dr. Norma Fredrickson called.  "We received a copy of urine culture and sensitivity for this patient.  We do not know why we received this.  She is post transplant.  Was she seen by the doctor a two days ago?  If we need to do something with this please call the Triage number [phone] as we believe your office is handling this."

## 2015-06-15 NOTE — Telephone Encounter (Signed)
Called & spoke with pt's daughter & informed that script sent to pharmacy regarding UTI & to p/u med & start & finish med & to call next week to let us know if symptom's improved.

## 2015-06-19 ENCOUNTER — Other Ambulatory Visit: Payer: Self-pay | Admitting: *Deleted

## 2015-06-19 DIAGNOSIS — C9010 Plasma cell leukemia not having achieved remission: Secondary | ICD-10-CM

## 2015-06-20 ENCOUNTER — Other Ambulatory Visit (HOSPITAL_BASED_OUTPATIENT_CLINIC_OR_DEPARTMENT_OTHER): Payer: BLUE CROSS/BLUE SHIELD

## 2015-06-20 ENCOUNTER — Ambulatory Visit (HOSPITAL_BASED_OUTPATIENT_CLINIC_OR_DEPARTMENT_OTHER): Payer: BLUE CROSS/BLUE SHIELD

## 2015-06-20 VITALS — BP 102/51 | HR 92 | Temp 98.0°F | Resp 16

## 2015-06-20 DIAGNOSIS — C9011 Plasma cell leukemia in remission: Secondary | ICD-10-CM | POA: Diagnosis not present

## 2015-06-20 DIAGNOSIS — C9010 Plasma cell leukemia not having achieved remission: Secondary | ICD-10-CM

## 2015-06-20 LAB — CBC WITH DIFFERENTIAL/PLATELET
BASO%: 0.5 % (ref 0.0–2.0)
BASOS ABS: 0 10*3/uL (ref 0.0–0.1)
EOS ABS: 0.1 10*3/uL (ref 0.0–0.5)
EOS%: 1.2 % (ref 0.0–7.0)
HCT: 32.5 % — ABNORMAL LOW (ref 34.8–46.6)
HGB: 10.5 g/dL — ABNORMAL LOW (ref 11.6–15.9)
LYMPH%: 18 % (ref 14.0–49.7)
MCH: 29.3 pg (ref 25.1–34.0)
MCHC: 32.4 g/dL (ref 31.5–36.0)
MCV: 90.5 fL (ref 79.5–101.0)
MONO#: 0.6 10*3/uL (ref 0.1–0.9)
MONO%: 10.8 % (ref 0.0–14.0)
NEUT#: 3.9 10*3/uL (ref 1.5–6.5)
NEUT%: 69.5 % (ref 38.4–76.8)
Platelets: 249 10*3/uL (ref 145–400)
RBC: 3.59 10*6/uL — AB (ref 3.70–5.45)
RDW: 18.4 % — ABNORMAL HIGH (ref 11.2–14.5)
WBC: 5.7 10*3/uL (ref 3.9–10.3)
lymph#: 1 10*3/uL (ref 0.9–3.3)

## 2015-06-20 LAB — COMPREHENSIVE METABOLIC PANEL
ALBUMIN: 3.5 g/dL (ref 3.5–5.0)
ALK PHOS: 221 U/L — AB (ref 40–150)
ALT: 15 U/L (ref 0–55)
AST: 45 U/L — ABNORMAL HIGH (ref 5–34)
Anion Gap: 7 mEq/L (ref 3–11)
BUN: 11.7 mg/dL (ref 7.0–26.0)
CO2: 29 mEq/L (ref 22–29)
Calcium: 9.7 mg/dL (ref 8.4–10.4)
Chloride: 105 mEq/L (ref 98–109)
Creatinine: 0.7 mg/dL (ref 0.6–1.1)
GLUCOSE: 107 mg/dL (ref 70–140)
POTASSIUM: 3.8 meq/L (ref 3.5–5.1)
SODIUM: 141 meq/L (ref 136–145)
Total Bilirubin: 0.32 mg/dL (ref 0.20–1.20)
Total Protein: 7 g/dL (ref 6.4–8.3)

## 2015-06-20 MED ORDER — HEPARIN SOD (PORK) LOCK FLUSH 100 UNIT/ML IV SOLN
500.0000 [IU] | Freq: Once | INTRAVENOUS | Status: AC
  Administered 2015-06-20: 500 [IU]
  Filled 2015-06-20: qty 5

## 2015-06-20 MED ORDER — SODIUM CHLORIDE 0.9 % IJ SOLN
10.0000 mL | Freq: Once | INTRAMUSCULAR | Status: AC
  Administered 2015-06-20: 10 mL
  Filled 2015-06-20: qty 10

## 2015-06-27 ENCOUNTER — Ambulatory Visit (HOSPITAL_BASED_OUTPATIENT_CLINIC_OR_DEPARTMENT_OTHER): Payer: BLUE CROSS/BLUE SHIELD

## 2015-06-27 ENCOUNTER — Other Ambulatory Visit (HOSPITAL_BASED_OUTPATIENT_CLINIC_OR_DEPARTMENT_OTHER): Payer: BLUE CROSS/BLUE SHIELD

## 2015-06-27 VITALS — BP 124/69 | HR 65 | Temp 98.5°F | Resp 12

## 2015-06-27 DIAGNOSIS — C9011 Plasma cell leukemia in remission: Secondary | ICD-10-CM | POA: Diagnosis not present

## 2015-06-27 DIAGNOSIS — Z95828 Presence of other vascular implants and grafts: Secondary | ICD-10-CM

## 2015-06-27 LAB — COMPREHENSIVE METABOLIC PANEL
ALT: 21 U/L (ref 0–55)
ANION GAP: 7 meq/L (ref 3–11)
AST: 51 U/L — ABNORMAL HIGH (ref 5–34)
Albumin: 3.3 g/dL — ABNORMAL LOW (ref 3.5–5.0)
Alkaline Phosphatase: 223 U/L — ABNORMAL HIGH (ref 40–150)
BUN: 12 mg/dL (ref 7.0–26.0)
CALCIUM: 9.5 mg/dL (ref 8.4–10.4)
CHLORIDE: 107 meq/L (ref 98–109)
CO2: 27 meq/L (ref 22–29)
Creatinine: 0.6 mg/dL (ref 0.6–1.1)
Glucose: 132 mg/dl (ref 70–140)
Potassium: 3.5 mEq/L (ref 3.5–5.1)
Sodium: 141 mEq/L (ref 136–145)
Total Bilirubin: <0.3 mg/dL (ref 0.20–1.20)
Total Protein: 6.7 g/dL (ref 6.4–8.3)

## 2015-06-27 LAB — CBC WITH DIFFERENTIAL/PLATELET
BASO%: 0.4 % (ref 0.0–2.0)
BASOS ABS: 0 10*3/uL (ref 0.0–0.1)
EOS ABS: 0.1 10*3/uL (ref 0.0–0.5)
EOS%: 1.1 % (ref 0.0–7.0)
HEMATOCRIT: 30.6 % — AB (ref 34.8–46.6)
HGB: 10.1 g/dL — ABNORMAL LOW (ref 11.6–15.9)
LYMPH%: 22.1 % (ref 14.0–49.7)
MCH: 29.9 pg (ref 25.1–34.0)
MCHC: 33 g/dL (ref 31.5–36.0)
MCV: 90.8 fL (ref 79.5–101.0)
MONO#: 0.5 10*3/uL (ref 0.1–0.9)
MONO%: 9.7 % (ref 0.0–14.0)
NEUT#: 3.2 10*3/uL (ref 1.5–6.5)
NEUT%: 66.7 % (ref 38.4–76.8)
PLATELETS: 191 10*3/uL (ref 145–400)
RBC: 3.37 10*6/uL — AB (ref 3.70–5.45)
RDW: 17 % — ABNORMAL HIGH (ref 11.2–14.5)
WBC: 4.9 10*3/uL (ref 3.9–10.3)
lymph#: 1.1 10*3/uL (ref 0.9–3.3)

## 2015-06-27 MED ORDER — SODIUM CHLORIDE 0.9% FLUSH
10.0000 mL | INTRAVENOUS | Status: DC | PRN
  Administered 2015-06-27: 10 mL via INTRAVENOUS
  Filled 2015-06-27: qty 10

## 2015-06-27 MED ORDER — HEPARIN SOD (PORK) LOCK FLUSH 100 UNIT/ML IV SOLN
500.0000 [IU] | Freq: Once | INTRAVENOUS | Status: AC
  Administered 2015-06-27: 500 [IU] via INTRAVENOUS
  Filled 2015-06-27: qty 5

## 2015-06-27 NOTE — Patient Instructions (Signed)

## 2015-07-04 ENCOUNTER — Other Ambulatory Visit (HOSPITAL_BASED_OUTPATIENT_CLINIC_OR_DEPARTMENT_OTHER): Payer: BLUE CROSS/BLUE SHIELD

## 2015-07-04 ENCOUNTER — Ambulatory Visit (HOSPITAL_BASED_OUTPATIENT_CLINIC_OR_DEPARTMENT_OTHER): Payer: BLUE CROSS/BLUE SHIELD

## 2015-07-04 VITALS — BP 103/54 | HR 104 | Temp 98.9°F | Resp 16

## 2015-07-04 DIAGNOSIS — Z95828 Presence of other vascular implants and grafts: Secondary | ICD-10-CM

## 2015-07-04 DIAGNOSIS — C9011 Plasma cell leukemia in remission: Secondary | ICD-10-CM

## 2015-07-04 DIAGNOSIS — C9010 Plasma cell leukemia not having achieved remission: Secondary | ICD-10-CM

## 2015-07-04 LAB — CBC WITH DIFFERENTIAL/PLATELET
BASO%: 0.3 % (ref 0.0–2.0)
Basophils Absolute: 0 10*3/uL (ref 0.0–0.1)
EOS%: 0.6 % (ref 0.0–7.0)
Eosinophils Absolute: 0 10*3/uL (ref 0.0–0.5)
HEMATOCRIT: 31.5 % — AB (ref 34.8–46.6)
HEMOGLOBIN: 10.4 g/dL — AB (ref 11.6–15.9)
LYMPH#: 1.1 10*3/uL (ref 0.9–3.3)
LYMPH%: 21.8 % (ref 14.0–49.7)
MCH: 30.1 pg (ref 25.1–34.0)
MCHC: 33 g/dL (ref 31.5–36.0)
MCV: 91.2 fL (ref 79.5–101.0)
MONO#: 0.7 10*3/uL (ref 0.1–0.9)
MONO%: 12.8 % (ref 0.0–14.0)
NEUT%: 64.5 % (ref 38.4–76.8)
NEUTROS ABS: 3.4 10*3/uL (ref 1.5–6.5)
Platelets: 195 10*3/uL (ref 145–400)
RBC: 3.46 10*6/uL — ABNORMAL LOW (ref 3.70–5.45)
RDW: 15.3 % — AB (ref 11.2–14.5)
WBC: 5.3 10*3/uL (ref 3.9–10.3)

## 2015-07-04 LAB — COMPREHENSIVE METABOLIC PANEL
ALBUMIN: 3.5 g/dL (ref 3.5–5.0)
ALK PHOS: 209 U/L — AB (ref 40–150)
ALT: 20 U/L (ref 0–55)
AST: 47 U/L — AB (ref 5–34)
Anion Gap: 8 mEq/L (ref 3–11)
BUN: 10.6 mg/dL (ref 7.0–26.0)
CALCIUM: 9.6 mg/dL (ref 8.4–10.4)
CHLORIDE: 105 meq/L (ref 98–109)
CO2: 27 mEq/L (ref 22–29)
CREATININE: 0.7 mg/dL (ref 0.6–1.1)
EGFR: >90 mL/min/{1.73_m2} (ref >90)
GLUCOSE: 120 mg/dL (ref 70–140)
POTASSIUM: 3.6 meq/L (ref 3.5–5.1)
SODIUM: 140 meq/L (ref 136–145)
Total Bilirubin: 0.49 mg/dL (ref 0.20–1.20)
Total Protein: 6.9 g/dL (ref 6.4–8.3)

## 2015-07-04 MED ORDER — HEPARIN SOD (PORK) LOCK FLUSH 100 UNIT/ML IV SOLN
500.0000 [IU] | Freq: Once | INTRAVENOUS | Status: AC | PRN
  Administered 2015-07-04: 500 [IU] via INTRAVENOUS
  Filled 2015-07-04: qty 5

## 2015-07-04 MED ORDER — SODIUM CHLORIDE 0.9 % IJ SOLN
10.0000 mL | INTRAMUSCULAR | Status: DC | PRN
  Administered 2015-07-04: 10 mL via INTRAVENOUS
  Filled 2015-07-04: qty 10

## 2015-07-04 NOTE — Patient Instructions (Signed)

## 2015-07-11 ENCOUNTER — Other Ambulatory Visit: Payer: BLUE CROSS/BLUE SHIELD

## 2015-07-16 ENCOUNTER — Telehealth: Payer: Self-pay | Admitting: Hematology

## 2015-07-16 ENCOUNTER — Other Ambulatory Visit: Payer: BLUE CROSS/BLUE SHIELD

## 2015-07-16 ENCOUNTER — Ambulatory Visit (HOSPITAL_BASED_OUTPATIENT_CLINIC_OR_DEPARTMENT_OTHER): Payer: BLUE CROSS/BLUE SHIELD | Admitting: Hematology

## 2015-07-16 ENCOUNTER — Encounter: Payer: Self-pay | Admitting: Hematology

## 2015-07-16 VITALS — BP 108/57 | HR 96 | Temp 98.2°F | Resp 18 | Ht 63.0 in | Wt 121.0 lb

## 2015-07-16 DIAGNOSIS — B181 Chronic viral hepatitis B without delta-agent: Secondary | ICD-10-CM

## 2015-07-16 DIAGNOSIS — C9011 Plasma cell leukemia in remission: Secondary | ICD-10-CM | POA: Diagnosis not present

## 2015-07-16 DIAGNOSIS — R530 Neoplastic (malignant) related fatigue: Secondary | ICD-10-CM

## 2015-07-16 DIAGNOSIS — E876 Hypokalemia: Secondary | ICD-10-CM

## 2015-07-16 DIAGNOSIS — D649 Anemia, unspecified: Secondary | ICD-10-CM

## 2015-07-16 DIAGNOSIS — R52 Pain, unspecified: Secondary | ICD-10-CM

## 2015-07-16 MED ORDER — THERAPEUTIC MULTIVIT/MINERAL PO TABS: 1.0000 | ORAL_TABLET | Freq: Every day | ORAL | Status: DC

## 2015-07-16 MED ORDER — POTASSIUM CHLORIDE ER 10 MEQ PO CPCR: 20.0000 meq | ORAL_CAPSULE | Freq: Every day | ORAL | Status: DC

## 2015-07-16 MED ORDER — OMEPRAZOLE 20 MG PO CPDR: 20.0000 mg | DELAYED_RELEASE_CAPSULE | Freq: Every day | ORAL | Status: DC

## 2015-07-16 NOTE — Telephone Encounter (Signed)
Cancelled lab per pof

## 2015-07-16 NOTE — Progress Notes (Signed)
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   10/10/2014 Miscellaneous Peripheral blood chemistry and leukocytosis with total white count 78K, comprised of large plasma cells and his normocytic anemia. There is a myeloid left shift with previous surgical radium blasts. Flow cytometry showed 64% plasma cells   10/10/2014 Bone Marrow Biopsy Markedly hypercellular marrow (95%), Atypical plasma cells comprise 57% of the cellularity. There was diminished multilineage in hematopoiesis with adequate maturation. Breasts less than 1%), no overt dysplasia of the myeloid or erythroid lineages.    10/10/2014 Initial Diagnosis Plasma cell leukemia   10/13/2014 -  Chemotherapy CyborD (cytoxan 324m/m2 iv, bortezomib 1.5 mg/m, dexamethasone 40 mg, weekly every 28 days, bortezomib and dexamethasone was given twice weekly for 2 weeks during the first cycle)   04[facility]04[facility] course was complicated by sepsis from Escherichia coli bacteremia and associated colitis, she was discharged home on 04/27/2015.   05/07/2015 - 05/12/2015 [facility], [facility]. ID workup was negative, EGD showed evidence of gastritis and duodenitis, no H. pylori or CMV.   05/20/2015 - 05/24/2015 [facility]   07/11/2015 Bone Marrow Biopsy Post transplant 100 a bone marrow biopsy showed hypocellular marrow, 20%, no increase in plasma cells (2%) or other abnormalities.    HISTORY OF PRESENTING ILLNESS:  Sheryl [name] 45y.o. female is here because of recently diagnosed plasma cell leukemia. [facility] and is here to establish her local oncological care with [place] she is accompanied to the clinic by her daughter and interpreter.  She presented to our hospital lth with worsening dyspnea, fatigue, cough, subjective fevers and chills, and was admitted on 09/14/2014. She was found to have allergy WBC 54.1K, hemoglobin 8.1, plt 310, she was treated with symptom management, and was discharged home on 09/17/2014, with a plan to follow-up with hematology. She represents to emergency room on 10/07/2014, was found to have white count of 78K, and worsening anemia with hemoglobin 6. She was seen by my partner Dr. EJonette Evaand plasma cell leukemia was suspected. [facility] Bone marrow biopsy was done, which confirmed plasma cell leukemia, and she started chemotherapy with Velcade, Cytoxan and dexamethasone. She received weekly dose twice, last dose on 10/20/2014. She was discharged to home afterwards. She had a mediport placed during her hospitalization.  She has moderate fatigue, low appetie, she lost about 13 lbs in the past few months. She has moderate abdominal pain, 5-6/10, persistent, she does not take any pain meds.   I reviewed her medical records extensively, and discussed her case with Dr. EJorje Guildand BWest Lineprogram coordinator.  CURRENT THERAPY:  Supportive care  INTERIM HISTORY: Sheryl [name] returns for follow up.  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 No episodes of fever, infection or other medical events since I saw her last time. She underwent post transplant 100 days checkup at [facility] last week, and had a bone marrow biopsy and the blood test. She is scheduled to see them back in a few weeks to discuss the results.  MEDICAL HISTORY:  Past Medical History  Diagnosis Date  . GERD (gastroesophageal reflux disease)   . Hepatitis   . History of positive PPD     DX 2011--  CXR DONE NO EVIDENCE  . Hydronephrosis, right   . Right ureteral stone   . History of ureter stent   . Chills with fever     intermittently since d/c from hospital  . Dysuria-frequency syndrome     w/ pink urine  . Urosepsis 8/14    admitted to [facility]  . Pneumonia   . Neuromuscular disorder (HCC)     legs numb intermittently    SURGICAL HISTORY: Past Surgical History  Procedure Laterality Date  . Cystoscopy w/ ureteral stent placement Right 09/25/2012    Procedure: CYSTOSCOPY WITH RETROGRADE PYELOGRAM/URETERAL STENT PLACEMENT;  Surgeon: Alexis Frock, MD;  Location: [facility];  Service: Urology;  Laterality: Right;  . Liver biopsy    . Right vats w/ drainage peural effusion and bx's  10-30-2008  . Removal of uterine cyst      years ago  . Other surgical history Right     removal of ovarian cyst  . Cystoscopy with retrograde pyelogram, ureteroscopy and stent placement Right 10/15/2012    Procedure: CYSTOSCOPY WITH RETROGRADE PYELOGRAM, URETEROSCOPY AND REMOVAL STENT WITH  STENT PLACEMENT;  Surgeon: Alexis Frock, MD;  Location[facility];  Service: Urology;  Laterality: Right;  . Holmium laser application Right 1/61/0960    Procedure: HOLMIUM LASER APPLICATION;  Surgeon: Alexis Frock, MD;  Location: [facility];  Service: Urology;  Laterality: Right;  . Esophagogastroduodenoscopy (egd) with propofol N/A 11/16/2014    Procedure: ESOPHAGOGASTRODUODENOSCOPY (EGD) WITH PROPOFOL;  Surgeon: Milus Banister, MD;  Location:  [facility];  Service: Endoscopy;  Laterality: N/A;    SOCIAL HISTORY: Social History   Social History  . Marital Status: Single    Spouse Name: N/A  . Number of Children: 2  . Years of Education: N/A   Occupational History  . She used to work at a [employer]    Social History Main Topics  . Smoking status: Never Smoker   . Smokeless tobacco: Not on file  . Alcohol Use: No  . Drug Use: No  . Sexual Activity: Not Currently    Birth Control/ Protection: Abstinence   Other Topics Concern  . Not on file   Social History Narrative    FAMILY HISTORY: Family History  Problem Relation Age of Onset  . Stomach cancer Mother   . Lung disease Father   . Asthma Father     ALLERGIES:  has No Known Allergies.  MEDICATIONS:  Current Outpatient Prescriptions  Medication Sig Dispense Refill  . acyclovir (ZOVIRAX) 400 MG tablet Take 1 tablet (400 mg total) by mouth 2 (two) times daily. 60 tablet 3  . dexamethasone (DECADRON) 4 MG tablet Take 10 tablets (40 mg total) by mouth once a week. On the day of chemo treatment (Patient not taking: Reported on 05/16/2015) 40 tablet 2  . entecavir (BARACLUDE) 0.5 MG tablet Take 0.5 mg by mouth daily. Reported  on 06/13/2015    . fosfomycin (MONUROL) 3 g PACK Take 3 g by mouth once. (Patient not taking: Reported on 06/13/2015) 3 g 0  . lidocaine-prilocaine (EMLA) cream Apply 1 application topically as needed. 30 g 6  . Magnesium Cl-Calcium Carbonate (SLOW MAGNESIUM/CALCIUM) 70-117 MG TBEC Take 64 mg by mouth 2 (two) times daily. Reported on 06/13/2015    . Multiple Vitamins-Minerals (MULTIVITAMIN ADULT PO) Take 1 tablet by mouth daily. Reported on 06/13/2015    . nitrofurantoin, macrocrystal-monohydrate, (MACROBID) 100 MG capsule Take 1 capsule (100 mg total) by mouth 2 (two) times daily. 14 capsule 0  . omeprazole (PRILOSEC) 20 MG capsule Take 1 capsule (20 mg total) by mouth daily. 30 capsule 2  . polyethylene glycol (MIRALAX / GLYCOLAX) packet Take  17 g by mouth as needed for moderate constipation. 28 each 1  . potassium chloride (MICRO-K) 10 MEQ CR capsule Take 2 capsules (20 mEq total) by mouth 2 (two) times daily. Reported on 06/13/2015 30 capsule 1  . sulfamethoxazole-trimethoprim (BACTRIM DS,SEPTRA DS) 800-160 MG tablet Take 1 tablet by mouth every Monday, Wednesday, and Friday. Reported on 06/13/2015 20 tablet 1  . traMADol (ULTRAM) 50 MG tablet Take 1 tablet (50 mg total) by mouth every 6 (six) hours as needed. 60 tablet 0   No current facility-administered medications for this visit.    REVIEW OF SYSTEMS:   Constitutional: (+) intermittent fevers, no chills or abnormal night sweats Eyes: Denies blurriness of vision, double vision or watery eyes Ears, nose, mouth, throat, and face: Denies mucositis or sore throat Respiratory: Denies cough, dyspnea or wheezes Cardiovascular: Denies palpitation, chest discomfort or lower extremity swelling Gastrointestinal:  Denies nausea, heartburn or change in bowel habits Skin: Denies abnormal skin rashes Lymphatics: Denies new lymphadenopathy or easy bruising Neurological:Denies numbness, tingling or new weaknesses Behavioral/Psych: Mood is stable, no new changes  All other systems were reviewed with the patient and are negative.  PHYSICAL EXAMINATION: ECOG PERFORMANCE STATUS: 1  Filed Vitals:   07/16/15 1432  BP: 108/57  Pulse: 96  Temp: 98.2 F (36.8 C)  Resp: 18   Filed Weights   07/16/15 1432  Weight: 121 lb (54.885 kg)    GENERAL:alert, no distress and comfortable SKIN: skin color, texture, turgor are normal, no rashes or significant lesions EYES: normal, conjunctiva are pink and non-injected, sclera clear OROPHARYNX:no exudate, no erythema and lips, buccal mucosa, and tongue normal  NECK: supple, thyroid normal size, non-tender, without nodularity LYMPH:  no palpable lymphadenopathy in the cervical, axillary or inguinal LUNGS: clear to auscultation and percussion with  normal breathing effort HEART: regular rate & rhythm and no murmurs and no lower extremity edema ABDOMEN:abdomen soft, non-tender and normal bowel sounds Musculoskeletal:no cyanosis of digits and no clubbing  PSYCH: alert & oriented x 3 with fluent speech NEURO: no focal motor/sensory deficits  LABORATORY DATA:  I have reviewed the data as listed CBC Latest Ref Rng 07/04/2015 06/27/2015 06/20/2015  WBC 3.9 - 10.3 10e3/uL 5.3 4.9 5.7  Hemoglobin 11.6 - 15.9 g/dL 10.4(L) 10.1(L) 10.5(L)  Hematocrit 34.8 - 46.6 % 31.5(L) 30.6(L) 32.5(L)  Platelets 145 - 400 10e3/uL 195 191 249    CMP Latest Ref Rng 07/04/2015 06/27/2015 06/20/2015  Glucose 70 - 140 mg/dl 120 132 107  BUN 7.0 - 26.0 mg/dL 10.6 12.0 11.7  Creatinine 0.6 - 1.1 mg/dL 0.7 0.6 0.7  Sodium 136 - 145 mEq/L 140 141 141  Potassium 3.5 - 5.1 mEq/L 3.6 3.5 3.8  CO2 22 -  29 mEq/L _0 Calcium 8.4 - 10.4 mg/dL 9.6 9.5 9.7  Total Protein 6.4 - 8.3 g/dL 6.9 6.7 7.0  Total Bilirubin 0.20 - 1.20 mg/dL 0.49 <0.30 0.32  Alkaline Phos 40 - 150 U/L 209(H) 223(H) 221(H)  AST 5 - 34 U/L 47(H) 51(H) 45(H)  ALT 0 - 55 U/L _1 SPEP M-protein  09/15/2014: 4.2 10/08/14: 4.6 12/08/2014: 0.2 02/15/2015: not sufficient sample for test    IgG mg/dl 11/03/2014: 3150  12/08/2014:  759 02/14/2014: 860  Kappa/lambda light chains levels and ration  11/03/14: 0.75, 152, 0.00 12/22/2014: 1.10, 2.60, 0.42 02/15/2015: 9.59,14.35, 0.67  24 h urine UPEP/IFE and light chain: 11/03/2014: IFE showed a monoclonal IgG heavy chain with associated lambda light chain. M protein was Undetectable    PATHOLOGY REPORT  Bone Marrow (BM) and Peripheral Blood (PB) FINAL PATHOLOGIC DIAGNOSIS  BONE MARROW: 07/11/2015 Hypocellular marrow (20%) with no increase in plasma cells (2%). See comment.  PERIPHERAL BLOOD: Mild anemia. No circulating plasma cells identified. See comment and CBC data.  COMMENT: The bone marrow core biopsy and clot section are  hypocellular for age (20%, 10-40% range). There is no increase in plasma cells, which is confirmed by CD138 immunohistochemistry. Myeloid and erythroid elements are present in normal proportion. Megakaryocytes are morphologically unremarkable. There are no atypical lymphoid aggregates or granulomas.  The bone marrow aspirate smears are adequate for evaluation. Plasma cells are not increased in numbers (2%). There is no increase in blasts (<1%). Myeloid and erythroid elements are present in normal proportion with a M:E ratio of 2.5:1. There is no overt dysplasia of the myeloid, erythroid, or megakaryocytic lineages. There is no lymphocytosis.  Examination of peripheral blood reveals no circulating plasma cells or rouleaux formation. The background leukocytes are morphologically unremarkable and consist mostly of neutrophils.  Immunohistochemical controls worked appropriately.   RADIOGRAPHIC STUDIES: I have personally reviewed the radiological images as listed and agreed with the findings in the report. No results found.  ASSESSMENT & PLAN: 45 year old [place], presented with anemia and leokocytosis   1. 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 She will see Dr. Debbrah Alar back in a few weeks, we'll make a decision after her next visit. - we'll monitor labs  -continue acyclovir   2. fatigue and body pain  - secondary to chemotherapy and transplant -Much improved, she has recovered well.  3. Chronic Hepatitis B  -continue entecavir,  She will follow-up GI clinic [facility]  5. Communication and Social  support -She does not speak English, she is accompanied by interpreter to our cancer center -She has significant communication barrier, limited social support, and poor health literacy   6. Hypokalemia - She is taking potassium chloride 1 tablet a day, potassium 3.3 on her last visit at [facility] on 6/7 , I suggest her to continue potassium supplement and eats potassium enriched food -I reviewed her potassium  Plan -I refilled her potassium, and omeprazole -She will see Dr. Debbrah Alar at the [facility] on June 21 -I will see her back in 3 weeks to finalize her maintenance therapy  All questions were answered. The patient knows to  call the clinic with any problems, questions or concerns.  I spent 20 minutes counseling the patient face to face. The total time spent in the appointment was 30 minutes and more than 50% was on counseling.     Truitt Merle, MD 07/16/2015

## 2015-07-16 NOTE — Telephone Encounter (Signed)
Gave pt apt & avs °

## 2015-07-16 NOTE — Telephone Encounter (Signed)
called interpretor.. let pt know that lab was cancelled but she still needs to come for MD apt... lab cx per pof

## 2015-08-01 ENCOUNTER — Other Ambulatory Visit: Payer: Self-pay | Admitting: Hematology

## 2015-08-06 ENCOUNTER — Ambulatory Visit: Payer: BLUE CROSS/BLUE SHIELD

## 2015-08-06 ENCOUNTER — Other Ambulatory Visit: Payer: BLUE CROSS/BLUE SHIELD

## 2015-08-06 ENCOUNTER — Encounter: Payer: BLUE CROSS/BLUE SHIELD | Admitting: Hematology

## 2015-08-06 ENCOUNTER — Encounter: Payer: Self-pay | Admitting: Hematology

## 2015-08-06 ENCOUNTER — Telehealth: Payer: Self-pay | Admitting: *Deleted

## 2015-08-06 DIAGNOSIS — C9011 Plasma cell leukemia in remission: Secondary | ICD-10-CM

## 2015-08-06 NOTE — Telephone Encounter (Signed)
Pt came to office and informed Judeen Hammans in registration that she had labs done at [facility] this am.   Early Chars to register pt for office visit with Dr. Burr Medico as scheduled.  Nurse did not see any lab results from today in [place].    Was notified by Renford Dills, pt escort that pt had left the clinic without being seen.  Confirmed with Myrtle, charge nurse in infusion that pt had left prior to being seen. Called Barbette Reichmann, RN at Dr. Norma Fredrickson at [facility], and left message requesting a call back from [place].

## 2015-08-06 NOTE — Progress Notes (Signed)
This encounter was created in error - please disregard.

## 2015-08-09 ENCOUNTER — Telehealth: Payer: Self-pay | Admitting: Hematology

## 2015-08-09 NOTE — Telephone Encounter (Signed)
cld interperter service Katie she left message to call office to r/s appt

## 2015-08-13 ENCOUNTER — Telehealth: Payer: Self-pay | Admitting: *Deleted

## 2015-08-13 NOTE — Telephone Encounter (Signed)
Patient's family called and rescheduled missed appt from last week. They aware of new date/time

## 2015-08-20 ENCOUNTER — Other Ambulatory Visit (HOSPITAL_BASED_OUTPATIENT_CLINIC_OR_DEPARTMENT_OTHER): Payer: BLUE CROSS/BLUE SHIELD

## 2015-08-20 ENCOUNTER — Telehealth: Payer: Self-pay | Admitting: Hematology

## 2015-08-20 ENCOUNTER — Ambulatory Visit (HOSPITAL_BASED_OUTPATIENT_CLINIC_OR_DEPARTMENT_OTHER): Payer: BLUE CROSS/BLUE SHIELD | Admitting: Hematology

## 2015-08-20 VITALS — BP 104/73 | HR 81 | Temp 98.6°F | Resp 18 | Ht 63.0 in | Wt 123.4 lb

## 2015-08-20 DIAGNOSIS — C9011 Plasma cell leukemia in remission: Secondary | ICD-10-CM

## 2015-08-20 DIAGNOSIS — B1910 Unspecified viral hepatitis B without hepatic coma: Secondary | ICD-10-CM

## 2015-08-20 LAB — CBC WITH DIFFERENTIAL/PLATELET
BASO%: 0.2 % (ref 0.0–2.0)
Basophils Absolute: 0 10*3/uL (ref 0.0–0.1)
EOS%: 0.9 % (ref 0.0–7.0)
Eosinophils Absolute: 0 10*3/uL (ref 0.0–0.5)
HCT: 34 % — ABNORMAL LOW (ref 34.8–46.6)
HGB: 11.1 g/dL — ABNORMAL LOW (ref 11.6–15.9)
LYMPH%: 28.5 % (ref 14.0–49.7)
MCH: 29.5 pg (ref 25.1–34.0)
MCHC: 32.6 g/dL (ref 31.5–36.0)
MCV: 90.6 fL (ref 79.5–101.0)
MONO#: 0.4 10*3/uL (ref 0.1–0.9)
MONO%: 7.8 % (ref 0.0–14.0)
NEUT%: 62.6 % (ref 38.4–76.8)
NEUTROS ABS: 2.9 10*3/uL (ref 1.5–6.5)
PLATELETS: 212 10*3/uL (ref 145–400)
RBC: 3.76 10*6/uL (ref 3.70–5.45)
RDW: 14.1 % (ref 11.2–14.5)
WBC: 4.6 10*3/uL (ref 3.9–10.3)
lymph#: 1.3 10*3/uL (ref 0.9–3.3)

## 2015-08-20 LAB — COMPREHENSIVE METABOLIC PANEL
ALBUMIN: 3.5 g/dL (ref 3.5–5.0)
ALK PHOS: 153 U/L — AB (ref 40–150)
ALT: 12 U/L (ref 0–55)
AST: 39 U/L — AB (ref 5–34)
Anion Gap: 9 mEq/L (ref 3–11)
BUN: 15.1 mg/dL (ref 7.0–26.0)
CO2: 27 mEq/L (ref 22–29)
CREATININE: 0.7 mg/dL (ref 0.6–1.1)
Calcium: 9.4 mg/dL (ref 8.4–10.4)
Chloride: 107 mEq/L (ref 98–109)
EGFR: >90 mL/min/{1.73_m2} (ref >90)
GLUCOSE: 104 mg/dL (ref 70–140)
Potassium: 3.8 mEq/L (ref 3.5–5.1)
SODIUM: 142 meq/L (ref 136–145)
TOTAL PROTEIN: 7.7 g/dL (ref 6.4–8.3)

## 2015-08-20 MED ORDER — OMEPRAZOLE 20 MG PO CPDR: 20.0000 mg | DELAYED_RELEASE_CAPSULE | Freq: Every day | ORAL | Status: DC

## 2015-08-20 MED ORDER — DEXAMETHASONE 4 MG PO TABS: ORAL_TABLET | ORAL | Status: DC

## 2015-08-20 NOTE — Telephone Encounter (Signed)
Gave pt cal & avs °

## 2015-08-22 ENCOUNTER — Ambulatory Visit: Payer: BLUE CROSS/BLUE SHIELD

## 2015-08-22 ENCOUNTER — Ambulatory Visit (HOSPITAL_BASED_OUTPATIENT_CLINIC_OR_DEPARTMENT_OTHER): Payer: BLUE CROSS/BLUE SHIELD

## 2015-08-22 ENCOUNTER — Other Ambulatory Visit (HOSPITAL_BASED_OUTPATIENT_CLINIC_OR_DEPARTMENT_OTHER): Payer: BLUE CROSS/BLUE SHIELD

## 2015-08-22 DIAGNOSIS — Z5111 Encounter for antineoplastic chemotherapy: Secondary | ICD-10-CM | POA: Diagnosis not present

## 2015-08-22 DIAGNOSIS — C9010 Plasma cell leukemia not having achieved remission: Secondary | ICD-10-CM

## 2015-08-22 DIAGNOSIS — C9011 Plasma cell leukemia in remission: Secondary | ICD-10-CM | POA: Diagnosis not present

## 2015-08-22 DIAGNOSIS — Z5112 Encounter for antineoplastic immunotherapy: Secondary | ICD-10-CM | POA: Diagnosis not present

## 2015-08-22 DIAGNOSIS — Z95828 Presence of other vascular implants and grafts: Secondary | ICD-10-CM

## 2015-08-22 LAB — CBC WITH DIFFERENTIAL/PLATELET
BASO%: 0.3 % (ref 0.0–2.0)
BASOS ABS: 0 10*3/uL (ref 0.0–0.1)
EOS ABS: 0 10*3/uL (ref 0.0–0.5)
EOS%: 0.8 % (ref 0.0–7.0)
HCT: 31.7 % — ABNORMAL LOW (ref 34.8–46.6)
HGB: 10.5 g/dL — ABNORMAL LOW (ref 11.6–15.9)
LYMPH%: 21.1 % (ref 14.0–49.7)
MCH: 29.6 pg (ref 25.1–34.0)
MCHC: 33 g/dL (ref 31.5–36.0)
MCV: 89.6 fL (ref 79.5–101.0)
MONO#: 0.2 10*3/uL (ref 0.1–0.9)
MONO%: 4.8 % (ref 0.0–14.0)
NEUT#: 3.6 10*3/uL (ref 1.5–6.5)
NEUT%: 73 % (ref 38.4–76.8)
Platelets: 212 10*3/uL (ref 145–400)
RBC: 3.54 10*6/uL — AB (ref 3.70–5.45)
RDW: 14.5 % (ref 11.2–14.5)
WBC: 4.9 10*3/uL (ref 3.9–10.3)
lymph#: 1 10*3/uL (ref 0.9–3.3)

## 2015-08-22 LAB — COMPREHENSIVE METABOLIC PANEL
ALK PHOS: 143 U/L (ref 40–150)
ALT: 13 U/L (ref 0–55)
AST: 36 U/L — AB (ref 5–34)
Albumin: 3.4 g/dL — ABNORMAL LOW (ref 3.5–5.0)
Anion Gap: 8 mEq/L (ref 3–11)
BUN: 14 mg/dL (ref 7.0–26.0)
CHLORIDE: 106 meq/L (ref 98–109)
CO2: 27 mEq/L (ref 22–29)
Calcium: 9.5 mg/dL (ref 8.4–10.4)
Creatinine: 0.7 mg/dL (ref 0.6–1.1)
GLUCOSE: 124 mg/dL (ref 70–140)
POTASSIUM: 3.8 meq/L (ref 3.5–5.1)
SODIUM: 141 meq/L (ref 136–145)
Total Bilirubin: 0.33 mg/dL (ref 0.20–1.20)
Total Protein: 7.4 g/dL (ref 6.4–8.3)

## 2015-08-22 MED ORDER — ONDANSETRON HCL 8 MG PO TABS
ORAL_TABLET | ORAL | Status: AC
  Filled 2015-08-22: qty 1

## 2015-08-22 MED ORDER — BORTEZOMIB CHEMO SQ INJECTION 3.5 MG (2.5MG/ML)
1.5000 mg/m2 | Freq: Once | INTRAMUSCULAR | Status: AC
  Administered 2015-08-22: 2.5 mg via SUBCUTANEOUS
  Filled 2015-08-22: qty 2.5

## 2015-08-22 MED ORDER — DEXAMETHASONE 4 MG PO TABS: ORAL_TABLET | ORAL | Status: DC

## 2015-08-22 MED ORDER — CYCLOPHOSPHAMIDE CHEMO INJECTION 1 GM
300.0000 mg/m2 | Freq: Once | INTRAMUSCULAR | Status: AC
  Administered 2015-08-22: 480 mg via INTRAVENOUS
  Filled 2015-08-22: qty 24

## 2015-08-22 MED ORDER — SODIUM CHLORIDE 0.9% FLUSH
10.0000 mL | INTRAVENOUS | Status: DC | PRN
  Filled 2015-08-22: qty 10

## 2015-08-22 MED ORDER — HEPARIN SOD (PORK) LOCK FLUSH 100 UNIT/ML IV SOLN
500.0000 [IU] | INTRAVENOUS | Status: AC | PRN
  Administered 2015-08-22: 500 [IU]
  Filled 2015-08-22: qty 5

## 2015-08-22 MED ORDER — SODIUM CHLORIDE 0.9 % IJ SOLN
10.0000 mL | INTRAMUSCULAR | Status: DC | PRN
  Administered 2015-08-22: 10 mL via INTRAVENOUS
  Filled 2015-08-22: qty 10

## 2015-08-22 MED ORDER — SODIUM CHLORIDE 0.9 % IV SOLN
INTRAVENOUS | Status: DC
  Administered 2015-08-22: 14:00:00 via INTRAVENOUS

## 2015-08-22 MED ORDER — ONDANSETRON HCL 8 MG PO TABS
8.0000 mg | ORAL_TABLET | Freq: Once | ORAL | Status: AC
  Administered 2015-08-22: 8 mg via ORAL

## 2015-08-22 NOTE — Patient Instructions (Signed)
[facility] Discharge Instructions for Patients Receiving Chemotherapy  Today you received the following chemotherapy agents:  Velcade and Cytoxan.  To help prevent nausea and vomiting after your treatment, we encourage you to take your nausea medication as directed.   If you develop nausea and vomiting that is not controlled by your nausea medication, call the clinic.   BELOW ARE SYMPTOMS THAT SHOULD BE REPORTED IMMEDIATELY:  *FEVER GREATER THAN 100.5 F  *CHILLS WITH OR WITHOUT FEVER  NAUSEA AND VOMITING THAT IS NOT CONTROLLED WITH YOUR NAUSEA MEDICATION  *UNUSUAL SHORTNESS OF BREATH  *UNUSUAL BRUISING OR BLEEDING  TENDERNESS IN MOUTH AND THROAT WITH OR WITHOUT PRESENCE OF ULCERS  *URINARY PROBLEMS  *BOWEL PROBLEMS  UNUSUAL RASH Items with * indicate a potential emergency and should be followed up as soon as possible.  Feel free to call the clinic you have any questions or concerns. The clinic phone number is [phone].  Please show the CHEMO ALERT CARD at check-in to the Emergency Department and triage nurse.   

## 2015-08-25 ENCOUNTER — Encounter: Payer: Self-pay | Admitting: Hematology

## 2015-08-25 NOTE — Progress Notes (Signed)
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   10/10/2014 Miscellaneous Peripheral blood chemistry and leukocytosis with total white count 78K, comprised of large plasma cells and his normocytic anemia. There is a myeloid left shift with previous surgical radium blasts. Flow cytometry showed 64% plasma cells   10/10/2014 Bone Marrow Biopsy Markedly hypercellular marrow (95%), Atypical plasma cells comprise 57% of the cellularity. There was diminished multilineage in hematopoiesis with adequate maturation. Breasts less than 1%), no overt dysplasia of the myeloid or erythroid lineages.    10/10/2014 Initial Diagnosis Plasma cell leukemia   10/13/2014 -  Chemotherapy CyborD (cytoxan 332m/m2 iv, bortezomib 1.5 mg/m, dexamethasone 40 mg, weekly every 28 days, bortezomib and dexamethasone was given twice weekly for 2 weeks during the first cycle)   04[facility]04/[facility] course was complicated by sepsis from Escherichia coli bacteremia and associated colitis, she was discharged home on 04/27/2015.   05/07/2015 - 05/12/2015 [facility], [facility]. ID workup was negative, EGD showed evidence of gastritis and duodenitis, no H. pylori or CMV.   05/20/2015 - 05/24/2015 [facility]   07/11/2015 Bone Marrow Biopsy Post transplant 100 a bone marrow biopsy showed hypocellular marrow, 20%, no increase in plasma cells (2%) or other abnormalities.    HISTORY OF PRESENTING ILLNESS:  Sheryl [name] 45y.o. female is here because of recently diagnosed plasma cell leukemia. [facility] and is here to establish her local oncological care with [place] she is accompanied to the clinic by her daughter and interpreter.  She presented to our hospital lth with worsening dyspnea, fatigue, cough, subjective fevers and chills, and was admitted on 09/14/2014. She was found to have allergy WBC 54.1K, hemoglobin 8.1, plt 310, she was treated with symptom management, and was discharged home on 09/17/2014, with a plan to follow-up with hematology. She represents to emergency room on 10/07/2014, was found to have white count of 78K, and worsening anemia with hemoglobin 6. She was seen by my partner Dr. EJonette Evaand plasma cell leukemia was suspected. [facility] Bone marrow biopsy was done, which confirmed plasma cell leukemia, and she started chemotherapy with Velcade, Cytoxan and dexamethasone. She received weekly dose twice, last dose on 10/20/2014. She was discharged to home afterwards. She had a mediport placed during her hospitalization.  She has moderate fatigue, low appetie, she lost about 13 lbs in the past few months. She has moderate abdominal pain, 5-6/10, persistent, she does not take any pain meds.   I reviewed her medical records extensively, and discussed her case with Dr. EJorje Guildand [facility].  CURRENT THERAPY:  Pending Maintenance chemotherapy cyBorD  INTERIM HISTORY: Randall returns for follow  up. She is doing well overall. She was seen by Dr. Norma Fredrickson  at the [facility] one month ago, was admitted to [facility] for fever and a UTI, for which she completed a course of antibiotics. She has recovered well, her appetite and energy level has been back to her baseline, no fever, she has occasional back pain and leg pain. No other complaints.  MEDICAL HISTORY:  Past Medical History  Diagnosis Date  . GERD (gastroesophageal reflux disease)   . Hepatitis   . History of positive PPD     DX 2011--  CXR DONE NO EVIDENCE  . Hydronephrosis, right   . Right ureteral stone   . History of ureter stent   . Chills with fever     intermittently since d/c from hospital  . Dysuria-frequency syndrome     w/ pink urine  . Urosepsis 8/14    admitted to [facility]  . Pneumonia   . Neuromuscular disorder (HCC)     legs numb intermittently    SURGICAL HISTORY: Past Surgical History  Procedure Laterality Date  . Cystoscopy w/ ureteral stent placement Right 09/25/2012    Procedure: CYSTOSCOPY WITH RETROGRADE PYELOGRAM/URETERAL STENT PLACEMENT;  Surgeon: Alexis Frock, MD;  Location: [facility];  Service: Urology;  Laterality: Right;  . Liver biopsy    . Right vats w/ drainage peural effusion and bx's  10-30-2008  . Removal of uterine cyst      years ago  . Other surgical history Right     removal of ovarian cyst  . Cystoscopy with retrograde pyelogram, ureteroscopy and stent placement Right 10/15/2012    Procedure: CYSTOSCOPY WITH RETROGRADE PYELOGRAM, URETEROSCOPY AND REMOVAL STENT WITH  STENT PLACEMENT;  Surgeon: Alexis Frock, MD;  Location: [facility];  Service: Urology;  Laterality: Right;  . Holmium laser application Right 4/58/0998    Procedure: HOLMIUM LASER APPLICATION;  Surgeon: Alexis Frock, MD;  Location: [facility];  Service: Urology;  Laterality: Right;  . Esophagogastroduodenoscopy (egd) with propofol N/A 11/16/2014    Procedure: ESOPHAGOGASTRODUODENOSCOPY (EGD) WITH PROPOFOL;  Surgeon: Milus Banister, MD;   Location: [facility];  Service: Endoscopy;  Laterality: N/A;    SOCIAL HISTORY: Social History   Social History  . Marital Status: Single    Spouse Name: N/A  . Number of Children: 2  . Years of Education: N/A   Occupational History  . She used to work at a [employer]    Social History Main Topics  . Smoking status: Never Smoker   . Smokeless tobacco: Not on file  . Alcohol Use: No  . Drug Use: No  . Sexual Activity: Not Currently    Birth Control/ Protection: Abstinence   Other Topics Concern  . Not on file   Social History Narrative    FAMILY HISTORY: Family History  Problem Relation Age of Onset  . Stomach cancer Mother   . Lung disease Father   . Asthma Father     ALLERGIES:  has No Known Allergies.  MEDICATIONS:  Current Outpatient Prescriptions  Medication Sig Dispense Refill  . nitrofurantoin, macrocrystal-monohydrate, (MACROBID) 100 MG capsule Take 1 capsule (100 mg total) by mouth 2 (two) times daily. 14 capsule 0  . polyethylene glycol (MIRALAX / GLYCOLAX) packet Take 17 g by mouth as needed for moderate constipation. 28 each 1  . sulfamethoxazole-trimethoprim (BACTRIM DS,SEPTRA DS) 800-160 MG tablet Take 1 tablet by mouth every Monday, Wednesday, and Friday. Reported on 06/13/2015 20 tablet 1  .  dexamethasone (DECADRON) 4 MG tablet On the day of chemo treatment every 2 weeks 40 tablet 1  . omeprazole (PRILOSEC) 20 MG capsule Take 1 capsule (20 mg total) by mouth daily. 30 capsule 3   No current facility-administered medications for this visit.    REVIEW OF SYSTEMS:   Constitutional: (+) intermittent fevers, no chills or abnormal night sweats Eyes: Denies blurriness of vision, double vision or watery eyes Ears, nose, mouth, throat, and face: Denies mucositis or sore throat Respiratory: Denies cough, dyspnea or wheezes Cardiovascular: Denies palpitation, chest discomfort or lower extremity swelling Gastrointestinal:  Denies nausea, heartburn or  change in bowel habits Skin: Denies abnormal skin rashes Lymphatics: Denies new lymphadenopathy or easy bruising Neurological:Denies numbness, tingling or new weaknesses Behavioral/Psych: Mood is stable, no new changes  All other systems were reviewed with the patient and are negative.  PHYSICAL EXAMINATION: ECOG PERFORMANCE STATUS: 1  Filed Vitals:   08/20/15 1450  BP: 104/73  Pulse: 81  Temp: 98.6 F (37 C)  Resp: 18   Filed Weights   08/20/15 1450  Weight: 123 lb 6.4 oz (55.974 kg)    GENERAL:alert, no distress and comfortable SKIN: skin color, texture, turgor are normal, no rashes or significant lesions EYES: normal, conjunctiva are pink and non-injected, sclera clear OROPHARYNX:no exudate, no erythema and lips, buccal mucosa, and tongue normal  NECK: supple, thyroid normal size, non-tender, without nodularity LYMPH:  no palpable lymphadenopathy in the cervical, axillary or inguinal LUNGS: clear to auscultation and percussion with normal breathing effort HEART: regular rate & rhythm and no murmurs and no lower extremity edema ABDOMEN:abdomen soft, non-tender and normal bowel sounds Musculoskeletal:no cyanosis of digits and no clubbing  PSYCH: alert & oriented x 3 with fluent speech NEURO: no focal motor/sensory deficits  LABORATORY DATA:  I have reviewed the data as listed CBC Latest Ref Rng 08/22/2015 08/20/2015 07/04/2015  WBC 3.9 - 10.3 10e3/uL 4.9 4.6 5.3  Hemoglobin 11.6 - 15.9 g/dL 10.5(L) 11.1(L) 10.4(L)  Hematocrit 34.8 - 46.6 % 31.7(L) 34.0(L) 31.5(L)  Platelets 145 - 400 10e3/uL 212 212 195    CMP Latest Ref Rng 08/22/2015 08/20/2015 07/04/2015  Glucose 70 - 140 mg/dl 124 104 120  BUN 7.0 - 26.0 mg/dL 14.0 15.1 10.6  Creatinine 0.6 - 1.1 mg/dL 0.7 0.7 0.7  Sodium 136 - 145 mEq/L 141 142 140  Potassium 3.5 - 5.1 mEq/L 3.8 3.8 3.6  CO2 22 - 29 mEq/L _0 Calcium 8.4 - 10.4 mg/dL 9.5 9.4 9.6  Total Protein 6.4 - 8.3 g/dL 7.4 7.7 6.9  Total Bilirubin  0.20 - 1.20 mg/dL 0.33 <0.30 0.49  Alkaline Phos 40 - 150 U/L 143 153(H) 209(H)  AST 5 - 34 U/L 36(H) 39(H) 47(H)  ALT 0 - 55 U/L _1 SPEP M-protein  09/15/2014: 4.2 10/08/14: 4.6 12/08/2014: 0.2 02/15/2015: not sufficient sample for test    IgG mg/dl 11/03/2014: 3150  12/08/2014:  759 02/14/2014: 860  Kappa/lambda light chains levels and ration  11/03/14: 0.75, 152, 0.00 12/22/2014: 1.10, 2.60, 0.42 02/15/2015: 9.59,14.35, 0.67  24 h urine UPEP/IFE and light chain: 11/03/2014: IFE showed a monoclonal IgG heavy chain with associated lambda light chain. M protein was Undetectable    PATHOLOGY REPORT  Bone Marrow (BM) and Peripheral Blood (PB) FINAL PATHOLOGIC DIAGNOSIS  BONE MARROW: 07/11/2015 Hypocellular marrow (20%) with no increase in plasma cells (2%). See comment.  PERIPHERAL BLOOD: Mild anemia. No circulating plasma cells identified. See comment  and CBC data.  COMMENT: The bone marrow core biopsy and clot section are hypocellular for age (20%, 10-40% range). There is no increase in plasma cells, which is confirmed by CD138 immunohistochemistry. Myeloid and erythroid elements are present in normal proportion. Megakaryocytes are morphologically unremarkable. There are no atypical lymphoid aggregates or granulomas.  The bone marrow aspirate smears are adequate for evaluation. Plasma cells are not increased in numbers (2%). There is no increase in blasts (<1%). Myeloid and erythroid elements are present in normal proportion with a M:E ratio of 2.5:1. There is no overt dysplasia of the myeloid, erythroid, or megakaryocytic lineages. There is no lymphocytosis.  Examination of peripheral blood reveals no circulating plasma cells or rouleaux formation. The background leukocytes are morphologically unremarkable and consist mostly of neutrophils.  Immunohistochemical controls worked appropriately.   RADIOGRAPHIC STUDIES: I have personally reviewed the  radiological images as listed and agreed with the findings in the report. No results found.  ASSESSMENT & PLAN: 45 year old [place], presented with anemia and leokocytosis   1. 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 Norma Fredrickson recommendation to start maintenance chemotherapy with CyBorD every 2 weeks. She agrees. We'll start her later this week. -We'll start monitoring her SPEP, quantitative immunoglobulin and light chains level every 4 weeks. -continue acyclovir   2. Chronic Hepatitis B  - She will follow-up GI clinic at [facility]  5. Communication and Social support -She does not speak English, she is accompanied by interpreter to our cancer center -She has significant communication barrier, limited social support, and poor health literacy   6. Hypokalemia -resolved -will continue KCL daily   Plan -Start maintenance chemotherapy cyBorD on 08/22/2015 and continue every 2 weeks -per pt's request (vacation trip), she will return on 8/7 for second cycle chemo and see me    All questions were answered. The patient knows to call the clinic with any problems, questions or concerns.  I spent 20 minutes counseling the patient face to face. The total time spent in the appointment was 30 minutes and more than 50% was on counseling.     Truitt Merle, MD 08/20/2015

## 2015-09-10 ENCOUNTER — Ambulatory Visit: Payer: BLUE CROSS/BLUE SHIELD

## 2015-09-10 ENCOUNTER — Telehealth: Payer: Self-pay | Admitting: *Deleted

## 2015-09-10 ENCOUNTER — Other Ambulatory Visit: Payer: Self-pay | Admitting: *Deleted

## 2015-09-10 ENCOUNTER — Other Ambulatory Visit: Payer: BLUE CROSS/BLUE SHIELD

## 2015-09-10 ENCOUNTER — Ambulatory Visit: Payer: BLUE CROSS/BLUE SHIELD | Admitting: Hematology

## 2015-09-10 NOTE — Telephone Encounter (Signed)
Per staff message and POF I have scheduled appts. Advised scheduler of appts. JMW  

## 2015-09-10 NOTE — Progress Notes (Deleted)
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     10/10/2014 Miscellaneous    Peripheral blood chemistry and leukocytosis with total white count 78K, comprised of large plasma cells and his normocytic anemia. There is a myeloid left shift with previous surgical radium blasts. Flow cytometry showed 64% plasma cells     10/10/2014 Bone Marrow Biopsy    Markedly hypercellular marrow (95%), Atypical plasma cells comprise 57% of the cellularity. There was diminished multilineage in hematopoiesis with adequate maturation. Breasts less than 1%), no overt dysplasia of the myeloid or erythroid lineages.      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     05/07/2015 - 05/12/2015 Hospital Admission    [facility], [facility]. ID workup was negative, EGD showed evidence of gastritis and duodenitis, no H. pylori or CMV.     05/20/2015 -  05/24/2015 Hospital Admission    [facility]     07/11/2015 Bone Marrow Biopsy    Post transplant 100 a bone marrow biopsy showed hypocellular marrow, 20%, no increase in plasma cells (2%) or other abnormalities.      HISTORY OF PRESENTING ILLNESS:  Kelsee Mclaren 45y.o. female is here because of recently diagnosed plasma cell leukemia. [facility] and is here to establish her local oncological care with [place] she is accompanied to the clinic by her daughter and interpreter.  She presented to our hospital lth with worsening dyspnea, fatigue, cough, subjective fevers and chills, and was admitted on 09/14/2014. She was found to have allergy WBC 54.1K, hemoglobin 8.1, plt 310, she was treated with symptom management, and was discharged home on 09/17/2014, with a plan to follow-up with hematology. She represents to emergency room on 10/07/2014, was found to have white count of 78K, and worsening anemia with hemoglobin 6. She was seen by my partner Dr. EJonette Evaand plasma cell leukemia was suspected. [facility] Bone marrow biopsy was done, which confirmed plasma cell leukemia, and she started chemotherapy with Velcade, Cytoxan and dexamethasone. She received weekly dose twice, last dose on 10/20/2014. She was discharged to home afterwards. She had a mediport placed during her hospitalization.  She has moderate fatigue, low appetie, she lost about 13 lbs in the past few months. She has moderate abdominal  pain, 5-6/10, persistent, she does not take any pain meds.   I reviewed her medical records extensively, and discussed her case with Dr. Jorje Guild and [place] program coordinator.  CURRENT THERAPY:  Pending Maintenance chemotherapy cyBorD  INTERIM HISTORY: Denyse returns for  follow up. She is doing well overall. She was seen by Dr. Norma Fredrickson at the [facility] one month ago, was [facility] to [facility] for fever and a UTI, for which she completed a course of antibiotics. She has recovered well, her appetite and energy level has been back to her baseline, no fever, she has occasional back pain and leg pain. No other complaints.  MEDICAL HISTORY:  Past Medical History:  Diagnosis Date  . Chills with fever    intermittently since d/c from hospital  . Dysuria-frequency syndrome    w/ pink urine  . GERD (gastroesophageal reflux disease)   . Hepatitis   . History of positive PPD    DX 2011--  CXR DONE NO EVIDENCE  . History of ureter stent   . Hydronephrosis, right   . Neuromuscular disorder (HCC)    legs numb intermittently  . Pneumonia   . Right ureteral stone   . Urosepsis 8/14   admitted to [facility]    SURGICAL HISTORY: Past Surgical History:  Procedure Laterality Date  . CYSTOSCOPY W/ URETERAL STENT PLACEMENT Right 09/25/2012   Procedure: CYSTOSCOPY WITH RETROGRADE PYELOGRAM/URETERAL STENT PLACEMENT;  Surgeon: Alexis Frock, MD;  Location: [facility];  Service: Urology;  Laterality: Right;  . CYSTOSCOPY WITH RETROGRADE PYELOGRAM, URETEROSCOPY AND STENT PLACEMENT Right 10/15/2012   Procedure: CYSTOSCOPY WITH RETROGRADE PYELOGRAM, URETEROSCOPY AND REMOVAL STENT WITH  STENT PLACEMENT;  Surgeon: Alexis Frock, MD;  Location: [facility];  Service: Urology;  Laterality: Right;  . ESOPHAGOGASTRODUODENOSCOPY (EGD) WITH PROPOFOL N/A 11/16/2014   Procedure: ESOPHAGOGASTRODUODENOSCOPY (EGD) WITH PROPOFOL;  Surgeon: Milus Banister, MD;  Location: [facility];  Service: Endoscopy;  Laterality: N/A;  . HOLMIUM LASER APPLICATION Right 09/13/9145   Procedure: HOLMIUM LASER APPLICATION;  Surgeon: Alexis Frock, MD;  Location: [facility];  Service: Urology;  Laterality: Right;  . LIVER BIOPSY    . OTHER SURGICAL HISTORY Right    removal of  ovarian cyst  . removal of uterine cyst     years ago  . RIGHT VATS W/ DRAINAGE PEURAL EFFUSION AND BX'S  10-30-2008    SOCIAL HISTORY: Social History   Social History  . Marital Status: Single    Spouse Name: N/A  . Number of Children: 2  . Years of Education: N/A   Occupational History  . She used to work at a [employer] secretary    Social History Main Topics  . Smoking status: Never Smoker   . Smokeless tobacco: Not on file  . Alcohol Use: No  . Drug Use: No  . Sexual Activity: Not Currently    Birth Control/ Protection: Abstinence   Other Topics Concern  . Not on file   Social History Narrative    FAMILY HISTORY: Family History  Problem Relation Age of Onset  . Stomach cancer Mother   . Lung disease Father   . Asthma Father     ALLERGIES:  has No Known Allergies.  MEDICATIONS:  Current Outpatient Prescriptions  Medication Sig Dispense Refill  . dexamethasone (DECADRON) 4 MG tablet On the day of chemo treatment every 2 weeks 40 tablet 1  . nitrofurantoin, macrocrystal-monohydrate, (MACROBID) 100 MG capsule Take 1 capsule (100 mg total) by mouth 2 (two)  times daily. 14 capsule 0  . omeprazole (PRILOSEC) 20 MG capsule Take 1 capsule (20 mg total) by mouth daily. 30 capsule 3  . polyethylene glycol (MIRALAX / GLYCOLAX) packet Take 17 g by mouth as needed for moderate constipation. 28 each 1  . sulfamethoxazole-trimethoprim (BACTRIM DS,SEPTRA DS) 800-160 MG tablet Take 1 tablet by mouth every Monday, Wednesday, and Friday. Reported on 06/13/2015 20 tablet 1   No current facility-administered medications for this visit.     REVIEW OF SYSTEMS:   Constitutional: (+) intermittent fevers, no chills or abnormal night sweats Eyes: Denies blurriness of vision, double vision or watery eyes Ears, nose, mouth, throat, and face: Denies mucositis or sore throat Respiratory: Denies cough, dyspnea or wheezes Cardiovascular: Denies palpitation, chest discomfort or lower extremity  swelling Gastrointestinal:  Denies nausea, heartburn or change in bowel habits Skin: Denies abnormal skin rashes Lymphatics: Denies new lymphadenopathy or easy bruising Neurological:Denies numbness, tingling or new weaknesses Behavioral/Psych: Mood is stable, no new changes  All other systems were reviewed with the patient and are negative.  PHYSICAL EXAMINATION: ECOG PERFORMANCE STATUS: 1  There were no vitals filed for this visit. There were no vitals filed for this visit.  GENERAL:alert, no distress and comfortable SKIN: skin color, texture, turgor are normal, no rashes or significant lesions EYES: normal, conjunctiva are pink and non-injected, sclera clear OROPHARYNX:no exudate, no erythema and lips, buccal mucosa, and tongue normal  NECK: supple, thyroid normal size, non-tender, without nodularity LYMPH:  no palpable lymphadenopathy in the cervical, axillary or inguinal LUNGS: clear to auscultation and percussion with normal breathing effort HEART: regular rate & rhythm and no murmurs and no lower extremity edema ABDOMEN:abdomen soft, non-tender and normal bowel sounds Musculoskeletal:no cyanosis of digits and no clubbing  PSYCH: alert & oriented x 3 with fluent speech NEURO: no focal motor/sensory deficits  LABORATORY DATA:  I have reviewed the data as listed CBC Latest Ref Rng & Units 08/22/2015 08/20/2015 07/04/2015  WBC 3.9 - 10.3 10e3/uL 4.9 4.6 5.3  Hemoglobin 11.6 - 15.9 g/dL 10.5(L) 11.1(L) 10.4(L)  Hematocrit 34.8 - 46.6 % 31.7(L) 34.0(L) 31.5(L)  Platelets 145 - 400 10e3/uL 212 212 195    CMP Latest Ref Rng & Units 08/22/2015 08/20/2015 07/04/2015  Glucose 70 - 140 mg/dl 124 104 120  BUN 7.0 - 26.0 mg/dL 14.0 15.1 10.6  Creatinine 0.6 - 1.1 mg/dL 0.7 0.7 0.7  Sodium 136 - 145 mEq/L 141 142 140  Potassium 3.5 - 5.1 mEq/L 3.8 3.8 3.6  Chloride 101 - 111 mmol/L - - -  CO2 22 - 29 mEq/L '27 27 27  ' Calcium 8.4 - 10.4 mg/dL 9.5 9.4 9.6  Total Protein 6.4 - 8.3 g/dL 7.4  7.7 6.9  Total Bilirubin 0.20 - 1.20 mg/dL 0.33 <0.30 0.49  Alkaline Phos 40 - 150 U/L 143 153(H) 209(H)  AST 5 - 34 U/L 36(H) 39(H) 47(H)  ALT 0 - 55 U/L '13 12 20     ' SPEP M-protein  09/15/2014: 4.2 10/08/14: 4.6 12/08/2014: 0.2 02/15/2015: not sufficient sample for test    IgG mg/dl 11/03/2014: 3150  12/08/2014:  759 02/14/2014: 860  Kappa/lambda light chains levels and ration  11/03/14: 0.75, 152, 0.00 12/22/2014: 1.10, 2.60, 0.42 02/15/2015: 9.59,14.35, 0.67  24 h urine UPEP/IFE and light chain: 11/03/2014: IFE showed a monoclonal IgG heavy chain with associated lambda light chain. M protein was Undetectable    PATHOLOGY REPORT  Bone Marrow (BM) and Peripheral Blood (PB) FINAL PATHOLOGIC DIAGNOSIS  BONE MARROW:  07/11/2015 Hypocellular marrow (20%) with no increase in plasma cells (2%). See comment.  PERIPHERAL BLOOD: Mild anemia. No circulating plasma cells identified. See comment and CBC data.  COMMENT: The bone marrow core biopsy and clot section are hypocellular for age (20%, 10-40% range). There is no increase in plasma cells, which is confirmed by CD138 immunohistochemistry. Myeloid and erythroid elements are present in normal proportion. Megakaryocytes are morphologically unremarkable. There are no atypical lymphoid aggregates or granulomas.  The bone marrow aspirate smears are adequate for evaluation. Plasma cells are not increased in numbers (2%). There is no increase in blasts (<1%). Myeloid and erythroid elements are present in normal proportion with a M:E ratio of 2.5:1. There is no overt dysplasia of the myeloid, erythroid, or megakaryocytic lineages. There is no lymphocytosis.  Examination of peripheral blood reveals no circulating plasma cells or rouleaux formation. The background leukocytes are morphologically unremarkable and consist mostly of neutrophils.  Immunohistochemical controls worked appropriately.   RADIOGRAPHIC STUDIES: I have  personally reviewed the radiological images as listed and agreed with the findings in the report. No results found.  ASSESSMENT & PLAN: 45 year old [place], presented with anemia and leokocytosis   1. 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 Norma Fredrickson recommendation to start maintenance chemotherapy with CyBorD every 2 weeks. She agrees. We'll start her later this week. -We'll start monitoring her SPEP, quantitative immunoglobulin and light chains level every 4 weeks. -continue acyclovir   2. Chronic Hepatitis B  - She will follow-up [facility] clinic [facility]  5. Communication and Social support -She does not speak English, she is accompanied by interpreter to our cancer center -She has significant communication barrier, limited social support, and poor health literacy   6. Hypokalemia -resolved -will continue KCL daily   Plan -Start maintenance chemotherapy cyBorD on 08/22/2015 and continue every 2 weeks -per pt's request (vacation trip), she will return on 8/7 for second cycle chemo and see me    All questions were answered. The patient knows to call the clinic with any problems, questions or concerns.  I spent 20 minutes counseling the patient face to face. The total time spent in the appointment was 30 minutes and more than 50% was on counseling.     Truitt Merle, MD 08/20/2015

## 2015-09-11 ENCOUNTER — Encounter: Payer: Self-pay | Admitting: Hematology

## 2015-09-11 ENCOUNTER — Other Ambulatory Visit: Payer: Self-pay | Admitting: *Deleted

## 2015-09-11 ENCOUNTER — Telehealth: Payer: Self-pay | Admitting: Hematology

## 2015-09-11 ENCOUNTER — Other Ambulatory Visit (HOSPITAL_BASED_OUTPATIENT_CLINIC_OR_DEPARTMENT_OTHER): Payer: BLUE CROSS/BLUE SHIELD

## 2015-09-11 ENCOUNTER — Ambulatory Visit (HOSPITAL_BASED_OUTPATIENT_CLINIC_OR_DEPARTMENT_OTHER): Payer: BLUE CROSS/BLUE SHIELD

## 2015-09-11 ENCOUNTER — Ambulatory Visit (HOSPITAL_BASED_OUTPATIENT_CLINIC_OR_DEPARTMENT_OTHER): Payer: BLUE CROSS/BLUE SHIELD | Admitting: Hematology

## 2015-09-11 ENCOUNTER — Ambulatory Visit: Payer: BLUE CROSS/BLUE SHIELD

## 2015-09-11 VITALS — BP 106/65 | HR 68 | Temp 97.9°F | Resp 18 | Ht 63.0 in | Wt 122.1 lb

## 2015-09-11 DIAGNOSIS — Z5112 Encounter for antineoplastic immunotherapy: Secondary | ICD-10-CM

## 2015-09-11 DIAGNOSIS — C9011 Plasma cell leukemia in remission: Secondary | ICD-10-CM

## 2015-09-11 DIAGNOSIS — B181 Chronic viral hepatitis B without delta-agent: Secondary | ICD-10-CM | POA: Diagnosis not present

## 2015-09-11 DIAGNOSIS — E876 Hypokalemia: Secondary | ICD-10-CM

## 2015-09-11 DIAGNOSIS — C50911 Malignant neoplasm of unspecified site of right female breast: Secondary | ICD-10-CM | POA: Diagnosis not present

## 2015-09-11 DIAGNOSIS — C9010 Plasma cell leukemia not having achieved remission: Secondary | ICD-10-CM

## 2015-09-11 DIAGNOSIS — Z5111 Encounter for antineoplastic chemotherapy: Secondary | ICD-10-CM | POA: Diagnosis not present

## 2015-09-11 DIAGNOSIS — K219 Gastro-esophageal reflux disease without esophagitis: Secondary | ICD-10-CM | POA: Diagnosis not present

## 2015-09-11 DIAGNOSIS — Z95828 Presence of other vascular implants and grafts: Secondary | ICD-10-CM

## 2015-09-11 LAB — COMPREHENSIVE METABOLIC PANEL
ALBUMIN: 3.6 g/dL (ref 3.5–5.0)
ALK PHOS: 205 U/L — AB (ref 40–150)
ALT: 19 U/L (ref 0–55)
AST: 32 U/L (ref 5–34)
Anion Gap: 9 mEq/L (ref 3–11)
BUN: 21.7 mg/dL (ref 7.0–26.0)
CALCIUM: 10.1 mg/dL (ref 8.4–10.4)
CO2: 27 mEq/L (ref 22–29)
Chloride: 106 mEq/L (ref 98–109)
Creatinine: 0.7 mg/dL (ref 0.6–1.1)
Glucose: 127 mg/dl (ref 70–140)
POTASSIUM: 3.3 meq/L — AB (ref 3.5–5.1)
Sodium: 142 mEq/L (ref 136–145)
Total Bilirubin: <0.3 mg/dL (ref 0.20–1.20)
Total Protein: 7.6 g/dL (ref 6.4–8.3)

## 2015-09-11 LAB — CBC WITH DIFFERENTIAL/PLATELET
BASO%: 0.1 % (ref 0.0–2.0)
BASOS ABS: 0 10*3/uL (ref 0.0–0.1)
EOS ABS: 0 10*3/uL (ref 0.0–0.5)
EOS%: 0.1 % (ref 0.0–7.0)
HEMATOCRIT: 32.6 % — AB (ref 34.8–46.6)
HEMOGLOBIN: 10.6 g/dL — AB (ref 11.6–15.9)
LYMPH#: 1.3 10*3/uL (ref 0.9–3.3)
LYMPH%: 14.3 % (ref 14.0–49.7)
MCH: 29.3 pg (ref 25.1–34.0)
MCHC: 32.5 g/dL (ref 31.5–36.0)
MCV: 90.1 fL (ref 79.5–101.0)
MONO#: 0.8 10*3/uL (ref 0.1–0.9)
MONO%: 8.4 % (ref 0.0–14.0)
NEUT#: 7 10*3/uL — ABNORMAL HIGH (ref 1.5–6.5)
NEUT%: 77.1 % — ABNORMAL HIGH (ref 38.4–76.8)
PLATELETS: 179 10*3/uL (ref 145–400)
RBC: 3.62 10*6/uL — ABNORMAL LOW (ref 3.70–5.45)
RDW: 13.9 % (ref 11.2–14.5)
WBC: 9.1 10*3/uL (ref 3.9–10.3)

## 2015-09-11 MED ORDER — POTASSIUM CHLORIDE CRYS ER 20 MEQ PO TBCR: 20.0000 meq | EXTENDED_RELEASE_TABLET | Freq: Every day | ORAL | 0 refills | Status: DC

## 2015-09-11 MED ORDER — SODIUM CHLORIDE 0.9 % IV SOLN
300.0000 mg/m2 | Freq: Once | INTRAVENOUS | Status: AC
  Administered 2015-09-11: 480 mg via INTRAVENOUS
  Filled 2015-09-11: qty 24

## 2015-09-11 MED ORDER — OMEPRAZOLE 20 MG PO CPDR: 20.0000 mg | DELAYED_RELEASE_CAPSULE | Freq: Every day | ORAL | 3 refills | Status: DC

## 2015-09-11 MED ORDER — ONDANSETRON HCL 8 MG PO TABS
ORAL_TABLET | ORAL | Status: AC
  Filled 2015-09-11: qty 1

## 2015-09-11 MED ORDER — ONDANSETRON HCL 8 MG PO TABS
8.0000 mg | ORAL_TABLET | Freq: Once | ORAL | Status: AC
  Administered 2015-09-11: 8 mg via ORAL

## 2015-09-11 MED ORDER — BORTEZOMIB CHEMO SQ INJECTION 3.5 MG (2.5MG/ML)
1.5000 mg/m2 | Freq: Once | INTRAMUSCULAR | Status: AC
  Administered 2015-09-11: 2.5 mg via SUBCUTANEOUS
  Filled 2015-09-11: qty 2.5

## 2015-09-11 MED ORDER — SODIUM CHLORIDE 0.9 % IJ SOLN
10.0000 mL | INTRAMUSCULAR | Status: DC | PRN
  Administered 2015-09-11: 10 mL via INTRAVENOUS
  Filled 2015-09-11: qty 10

## 2015-09-11 MED ORDER — HEPARIN SOD (PORK) LOCK FLUSH 100 UNIT/ML IV SOLN
500.0000 [IU] | Freq: Once | INTRAVENOUS | Status: AC
  Administered 2015-09-11: 500 [IU] via INTRAVENOUS
  Filled 2015-09-11: qty 5

## 2015-09-11 MED ORDER — SODIUM CHLORIDE 0.9% FLUSH
10.0000 mL | Freq: Once | INTRAVENOUS | Status: AC
  Administered 2015-09-11: 10 mL via INTRAVENOUS
  Filled 2015-09-11: qty 10

## 2015-09-11 MED ORDER — SODIUM CHLORIDE 0.9 % IV SOLN
Freq: Once | INTRAVENOUS | Status: AC
  Administered 2015-09-11: 14:00:00 via INTRAVENOUS

## 2015-09-11 NOTE — Progress Notes (Signed)
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     10/10/2014 Miscellaneous    Peripheral blood chemistry and leukocytosis with total white count 78K, comprised of large plasma cells and his normocytic anemia. There is a myeloid left shift with previous surgical radium blasts. Flow cytometry showed 64% plasma cells     10/10/2014 Bone Marrow Biopsy    Markedly hypercellular marrow (95%), Atypical plasma cells comprise 57% of the cellularity. There was diminished multilineage in hematopoiesis with adequate maturation. Breasts less than 1%), no overt dysplasia of the myeloid or erythroid lineages.      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     05/07/2015 - 05/12/2015 Hospital Admission    [facility], [facility]. ID workup was negative, EGD showed evidence of gastritis and duodenitis, no H. pylori or CMV.     05/20/2015 -  05/24/2015 Hospital Admission    [facility]     07/11/2015 Bone Marrow Biopsy    Post transplant 100 a bone marrow biopsy showed hypocellular marrow, 20%, no increase in plasma cells (2%) or other abnormalities.     08/22/2015 -  Chemotherapy    MaintenanceCyborD (cytoxan 3019mm2 iv, bortezomib 1.5 mg/m, dexamethasone 40 mg, every 2 weeks      HISTORY OF PRESENTING ILLNESS:  Sheryl [name] 45.o. female is here because of recently diagnosed plasma cell leukemia. [facility] and is here to establish her local oncological care [place] is accompanied to the clinic by her daughter and interpreter.  She presented to our hospital lth with worsening dyspnea, fatigue, cough, subjective fevers and chills, and was admitted on 09/14/2014. She was found to have allergy WBC 54.1K, hemoglobin 8.1, plt 310, she was treated with symptom management, and was discharged home on 09/17/2014, with a plan to follow-up with hematology. She represents to emergency room on 10/07/2014, was found to have white count of 78K, and worsening anemia with hemoglobin 6. She was seen by my partner Dr. EnJonette Evand plasma cell leukemia was suspected. [facility] Bone marrow biopsy was done, which confirmed plasma cell leukemia, and she started chemotherapy with Velcade, Cytoxan and dexamethasone. She received weekly dose twice, last dose on 10/20/2014. She was discharged to home afterwards. She had a mediport placed  during her hospitalization.  She has moderate fatigue, low appetie, she lost about 13 lbs in the past few months. She has moderate abdominal pain, 5-6/10, persistent, she does not take any pain meds.   I reviewed her medical records extensively, and discussed her case with Dr. Jorje Guild and  [place] program coordinator.  CURRENT THERAPY:  Maintenance chemotherapy cyBorD every 2 weeks   INTERIM HISTORY: Sheryl [name] returns for follow up and chemo. She started maintenance chemotherapy 3 weeks ago, tolerated the first dose we will. She denies significant side effects from chemotherapy. She has chronic back pain, which is mild and tolerable. No other new complaints.  MEDICAL HISTORY:  Past Medical History:  Diagnosis Date  . Chills with fever    intermittently since d/c from hospital  . Dysuria-frequency syndrome    w/ pink urine  . GERD (gastroesophageal reflux disease)   . Hepatitis   . History of positive PPD    DX 2011--  CXR DONE NO EVIDENCE  . History of ureter stent   . Hydronephrosis, right   . Neuromuscular disorder (HCC)    legs numb intermittently  . Pneumonia   . Right ureteral stone   . Urosepsis 8/14   [facility]    SURGICAL HISTORY: Past Surgical History:  Procedure Laterality Date  . CYSTOSCOPY W/ URETERAL STENT PLACEMENT Right 09/25/2012   Procedure: CYSTOSCOPY WITH RETROGRADE PYELOGRAM/URETERAL STENT PLACEMENT;  Surgeon: Alexis Frock, MD;  Location: [facility];  Service: Urology;  Laterality: Right;  . CYSTOSCOPY WITH RETROGRADE PYELOGRAM, URETEROSCOPY AND STENT PLACEMENT Right 10/15/2012   Procedure: CYSTOSCOPY WITH RETROGRADE PYELOGRAM, URETEROSCOPY AND REMOVAL STENT WITH  STENT PLACEMENT;  Surgeon: Alexis Frock, MD;  Location: [facility];  Service: Urology;  Laterality: Right;  . ESOPHAGOGASTRODUODENOSCOPY (EGD) WITH PROPOFOL N/A 11/16/2014   Procedure: ESOPHAGOGASTRODUODENOSCOPY (EGD) WITH PROPOFOL;  Surgeon: Milus Banister, MD;  Location: [facility];  Service: Endoscopy;  Laterality: N/A;  . HOLMIUM LASER APPLICATION Right 5/91/6384   Procedure: HOLMIUM LASER APPLICATION;  Surgeon: Alexis Frock, MD;  Location: [facility];  Service: Urology;  Laterality: Right;  . LIVER BIOPSY    . OTHER SURGICAL HISTORY  Right    removal of ovarian cyst  . removal of uterine cyst     years ago  . RIGHT VATS W/ DRAINAGE PEURAL EFFUSION AND BX'S  10-30-2008    SOCIAL HISTORY: Social History   Social History  . Marital Status: Single    Spouse Name: N/A  . Number of Children: 2  . Years of Education: N/A   Occupational History  . She used to work at a [employer] secretary    Social History Main Topics  . Smoking status: Never Smoker   . Smokeless tobacco: Not on file  . Alcohol Use: No  . Drug Use: No  . Sexual Activity: Not Currently    Birth Control/ Protection: Abstinence   Other Topics Concern  . Not on file   Social History Narrative    FAMILY HISTORY: Family History  Problem Relation Age of Onset  . Stomach cancer Mother   . Lung disease Father   . Asthma Father     ALLERGIES:  has No Known Allergies.  MEDICATIONS:  Current Outpatient Prescriptions  Medication Sig Dispense Refill  . dexamethasone (DECADRON) 4 MG tablet On the day of chemo treatment every 2 weeks 40 tablet 1  . entecavir (BARACLUDE) 0.5 MG tablet Take 0.5 mg by mouth daily.    . nitrofurantoin,  macrocrystal-monohydrate, (MACROBID) 100 MG capsule Take 1 capsule (100 mg total) by mouth 2 (two) times daily. 14 capsule 0  . omeprazole (PRILOSEC) 20 MG capsule Take 1 capsule (20 mg total) by mouth daily. 30 capsule 3  . polyethylene glycol (MIRALAX / GLYCOLAX) packet Take 17 g by mouth as needed for moderate constipation. 28 each 1  . sulfamethoxazole-trimethoprim (BACTRIM DS,SEPTRA DS) 800-160 MG tablet Take 1 tablet by mouth every Monday, Wednesday, and Friday. Reported on 06/13/2015 20 tablet 1  . acyclovir (ZOVIRAX) 800 MG tablet Take 800 mg by mouth 2 (two) times daily.    . potassium chloride SA (K-DUR,KLOR-CON) 20 MEQ tablet Take 1 tablet (20 mEq total) by mouth daily. Take 1 tablet ( 20 meq ) by mouth daily for 7 days;  Then as directed. 20 tablet 0   No current facility-administered medications for this visit.      REVIEW OF SYSTEMS:   Constitutional: (+) intermittent fevers, no chills or abnormal night sweats Eyes: Denies blurriness of vision, double vision or watery eyes Ears, nose, mouth, throat, and face: Denies mucositis or sore throat Respiratory: Denies cough, dyspnea or wheezes Cardiovascular: Denies palpitation, chest discomfort or lower extremity swelling Gastrointestinal:  Denies nausea, heartburn or change in bowel habits Skin: Denies abnormal skin rashes Lymphatics: Denies new lymphadenopathy or easy bruising Neurological:Denies numbness, tingling or new weaknesses Behavioral/Psych: Mood is stable, no new changes  All other systems were reviewed with the patient and are negative.  PHYSICAL EXAMINATION: ECOG PERFORMANCE STATUS: 1  Vitals:   09/11/15 1311  BP: 106/65  Pulse: 68  Resp: 18  Temp: 97.9 F (36.6 C)   Filed Weights   09/11/15 1311  Weight: 122 lb 1.6 oz (55.4 kg)    GENERAL:alert, no distress and comfortable SKIN: skin color, texture, turgor are normal, no rashes or significant lesions EYES: normal, conjunctiva are pink and non-injected, sclera clear OROPHARYNX:no exudate, no erythema and lips, buccal mucosa, and tongue normal  NECK: supple, thyroid normal size, non-tender, without nodularity LYMPH:  no palpable lymphadenopathy in the cervical, axillary or inguinal LUNGS: clear to auscultation and percussion with normal breathing effort HEART: regular rate & rhythm and no murmurs and no lower extremity edema ABDOMEN:abdomen soft, non-tender and normal bowel sounds Musculoskeletal:no cyanosis of digits and no clubbing  PSYCH: alert & oriented x 3 with fluent speech NEURO: no focal motor/sensory deficits  LABORATORY DATA:  I have reviewed the data as listed CBC Latest Ref Rng & Units 09/11/2015 08/22/2015 08/20/2015  WBC 3.9 - 10.3 10e3/uL 9.1 4.9 4.6  Hemoglobin 11.6 - 15.9 g/dL 10.6(L) 10.5(L) 11.1(L)  Hematocrit 34.8 - 46.6 % 32.6(L) 31.7(L) 34.0(L)   Platelets 145 - 400 10e3/uL 179 212 212    CMP Latest Ref Rng & Units 09/11/2015 08/22/2015 08/20/2015  Glucose 70 - 140 mg/dl 127 124 104  BUN 7.0 - 26.0 mg/dL 21.7 14.0 15.1  Creatinine 0.6 - 1.1 mg/dL 0.7 0.7 0.7  Sodium 136 - 145 mEq/L 142 141 142  Potassium 3.5 - 5.1 mEq/L 3.3(L) 3.8 3.8  Chloride 101 - 111 mmol/L - - -  CO2 22 - 29 mEq/L '27 27 27  ' Calcium 8.4 - 10.4 mg/dL 10.1 9.5 9.4  Total Protein 6.4 - 8.3 g/dL 7.6 7.4 7.7  Total Bilirubin 0.20 - 1.20 mg/dL <0.30 0.33 <0.30  Alkaline Phos 40 - 150 U/L 205(H) 143 153(H)  AST 5 - 34 U/L 32 36(H) 39(H)  ALT 0 - 55 U/L 19 13 12  SPEP M-protein  09/15/2014: 4.2 10/08/14: 4.6 12/08/2014: 0.2 02/15/2015: not sufficient sample for test    IgG mg/dl 11/03/2014: 3150  12/08/2014:  759 02/14/2014: 860  Kappa/lambda light chains levels and ration  11/03/14: 0.75, 152, 0.00 12/22/2014: 1.10, 2.60, 0.42 02/15/2015: 9.59,14.35, 0.67  24 h urine UPEP/IFE and light chain: 11/03/2014: IFE showed a monoclonal IgG heavy chain with associated lambda light chain. M protein was Undetectable    PATHOLOGY REPORT  Bone Marrow (BM) and Peripheral Blood (PB) FINAL PATHOLOGIC DIAGNOSIS  BONE MARROW: 07/11/2015 Hypocellular marrow (20%) with no increase in plasma cells (2%). See comment.  PERIPHERAL BLOOD: Mild anemia. No circulating plasma cells identified. See comment and CBC data.  COMMENT: The bone marrow core biopsy and clot section are hypocellular for age (20%, 10-40% range). There is no increase in plasma cells, which is confirmed by CD138 immunohistochemistry. Myeloid and erythroid elements are present in normal proportion. Megakaryocytes are morphologically unremarkable. There are no atypical lymphoid aggregates or granulomas.  The bone marrow aspirate smears are adequate for evaluation. Plasma cells are not increased in numbers (2%). There is no increase in blasts (<1%). Myeloid and erythroid elements are present in normal  proportion with a M:E ratio of 2.5:1. There is no overt dysplasia of the myeloid, erythroid, or megakaryocytic lineages. There is no lymphocytosis.  Examination of peripheral blood reveals no circulating plasma cells or rouleaux formation. The background leukocytes are morphologically unremarkable and consist mostly of neutrophils.  Immunohistochemical controls worked appropriately.   RADIOGRAPHIC STUDIES: I have personally reviewed the radiological images as listed and agreed with the findings in the report. No results found.  ASSESSMENT & PLAN: 45 year old [place], presented with anemia and leokocytosis   1. 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 Norma Fredrickson recommendation to start maintenance chemotherapy with CyBorD every 2 weeks, for at least 4 months, then monthly.  -She is tolerating chemotherapy well, lab results reviewed with her, adequate for treatment, we'll proceed chemotherapy today. -We'll continue monitoring her SPEP, quantitative immunoglobulin and light chains level every 4 weeks. Today's results still pending. -continue acyclovir   2. Chronic Hepatitis B  - She [facility]  3. Hypokalemia -Her potassium 3.3 today -She will restart potassium 20 mg daily  4. GERD -Continue omeprazole daily. We discussed steroids can worsen her acid reflux.  Plan -second cycle cyBorD today and continue every 2 weeks -I'll see her back in 4 weeks. -We'll schedule her interpreter service for her follow-up appointment.   All questions were answered. The patient knows to call the clinic with any problems, questions or concerns.  I spent 20 minutes  counseling the patient face to face. The total time spent in the appointment was 30 minutes and more than 50% was on counseling.     Truitt Merle, MD 08/20/2015

## 2015-09-11 NOTE — Telephone Encounter (Signed)
Pt will p/u sched in tx room °

## 2015-09-11 NOTE — Patient Instructions (Signed)
[facility] Discharge Instructions for Patients Receiving Chemotherapy  Today you received the following chemotherapy agents: Cytoxan and Velcade  To help prevent nausea and vomiting after your treatment, we encourage you to take your nausea medication as directed.    If you develop nausea and vomiting that is not controlled by your nausea medication, call the clinic.   BELOW ARE SYMPTOMS THAT SHOULD BE REPORTED IMMEDIATELY:  *FEVER GREATER THAN 100.5 F  *CHILLS WITH OR WITHOUT FEVER  NAUSEA AND VOMITING THAT IS NOT CONTROLLED WITH YOUR NAUSEA MEDICATION  *UNUSUAL SHORTNESS OF BREATH  *UNUSUAL BRUISING OR BLEEDING  TENDERNESS IN MOUTH AND THROAT WITH OR WITHOUT PRESENCE OF ULCERS  *URINARY PROBLEMS  *BOWEL PROBLEMS  UNUSUAL RASH Items with * indicate a potential emergency and should be followed up as soon as possible.  Feel free to call the clinic you have any questions or concerns. The clinic phone number is [phone].  Please show the CHEMO ALERT CARD at check-in to the Emergency Department and triage nurse.   

## 2015-09-12 LAB — KAPPA/LAMBDA LIGHT CHAINS
IG KAPPA FREE LIGHT CHAIN: 24.4 mg/L — AB (ref 3.3–19.4)
Ig Lambda Free Light Chain: 27.2 mg/L — ABNORMAL HIGH (ref 5.7–26.3)
Kappa/Lambda FluidC Ratio: 0.9 (ref 0.26–1.65)

## 2015-09-13 LAB — MULTIPLE MYELOMA PANEL, SERUM
ALBUMIN/GLOB SERPL: 1 (ref 0.7–1.7)
ALPHA2 GLOB SERPL ELPH-MCNC: 0.9 g/dL (ref 0.4–1.0)
Albumin SerPl Elph-Mcnc: 3.5 g/dL (ref 2.9–4.4)
Alpha 1: 0.3 g/dL (ref 0.0–0.4)
B-GLOBULIN SERPL ELPH-MCNC: 1 g/dL (ref 0.7–1.3)
Gamma Glob SerPl Elph-Mcnc: 1.3 g/dL (ref 0.4–1.8)
Globulin, Total: 3.6 g/dL (ref 2.2–3.9)
IGM (IMMUNOGLOBIN M), SRM: 59 mg/dL (ref 26–217)
IgA, Qn, Serum: 152 mg/dL (ref 87–352)
Total Protein: 7.1 g/dL (ref 6.0–8.5)

## 2015-09-25 ENCOUNTER — Other Ambulatory Visit (HOSPITAL_BASED_OUTPATIENT_CLINIC_OR_DEPARTMENT_OTHER): Payer: BLUE CROSS/BLUE SHIELD

## 2015-09-25 ENCOUNTER — Ambulatory Visit (HOSPITAL_BASED_OUTPATIENT_CLINIC_OR_DEPARTMENT_OTHER): Payer: BLUE CROSS/BLUE SHIELD

## 2015-09-25 VITALS — BP 102/66 | HR 88 | Temp 98.0°F | Resp 18

## 2015-09-25 DIAGNOSIS — C9010 Plasma cell leukemia not having achieved remission: Secondary | ICD-10-CM

## 2015-09-25 DIAGNOSIS — Z95828 Presence of other vascular implants and grafts: Secondary | ICD-10-CM

## 2015-09-25 DIAGNOSIS — C9011 Plasma cell leukemia in remission: Secondary | ICD-10-CM | POA: Diagnosis not present

## 2015-09-25 DIAGNOSIS — Z5111 Encounter for antineoplastic chemotherapy: Secondary | ICD-10-CM | POA: Diagnosis not present

## 2015-09-25 DIAGNOSIS — Z5112 Encounter for antineoplastic immunotherapy: Secondary | ICD-10-CM | POA: Diagnosis not present

## 2015-09-25 LAB — COMPREHENSIVE METABOLIC PANEL
ALT: 11 U/L (ref 0–55)
ANION GAP: 10 meq/L (ref 3–11)
AST: 38 U/L — ABNORMAL HIGH (ref 5–34)
Albumin: 3.6 g/dL (ref 3.5–5.0)
Alkaline Phosphatase: 189 U/L — ABNORMAL HIGH (ref 40–150)
BUN: 16.5 mg/dL (ref 7.0–26.0)
CALCIUM: 9.9 mg/dL (ref 8.4–10.4)
CO2: 25 mEq/L (ref 22–29)
CREATININE: 0.8 mg/dL (ref 0.6–1.1)
Chloride: 105 mEq/L (ref 98–109)
EGFR: 89 mL/min/{1.73_m2} — ABNORMAL LOW (ref >90)
Glucose: 101 mg/dl (ref 70–140)
Potassium: 4.6 mEq/L (ref 3.5–5.1)
Sodium: 140 mEq/L (ref 136–145)
TOTAL PROTEIN: 8 g/dL (ref 6.4–8.3)

## 2015-09-25 LAB — CBC WITH DIFFERENTIAL/PLATELET
BASO%: 0.1 % (ref 0.0–2.0)
Basophils Absolute: 0 10*3/uL (ref 0.0–0.1)
EOS ABS: 0 10*3/uL (ref 0.0–0.5)
EOS%: 0.1 % (ref 0.0–7.0)
HEMATOCRIT: 36.2 % (ref 34.8–46.6)
HEMOGLOBIN: 11.8 g/dL (ref 11.6–15.9)
LYMPH%: 6.5 % — ABNORMAL LOW (ref 14.0–49.7)
MCH: 29.3 pg (ref 25.1–34.0)
MCHC: 32.6 g/dL (ref 31.5–36.0)
MCV: 89.8 fL (ref 79.5–101.0)
MONO#: 0.1 10*3/uL (ref 0.1–0.9)
MONO%: 1.3 % (ref 0.0–14.0)
NEUT%: 92 % — ABNORMAL HIGH (ref 38.4–76.8)
NEUTROS ABS: 8.5 10*3/uL — AB (ref 1.5–6.5)
NRBC: 0 % (ref 0–0)
PLATELETS: 214 10*3/uL (ref 145–400)
RBC: 4.03 10*6/uL (ref 3.70–5.45)
RDW: 13.9 % (ref 11.2–14.5)
WBC: 9.3 10*3/uL (ref 3.9–10.3)
lymph#: 0.6 10*3/uL — ABNORMAL LOW (ref 0.9–3.3)

## 2015-09-25 MED ORDER — SODIUM CHLORIDE 0.9 % IJ SOLN
10.0000 mL | INTRAMUSCULAR | Status: DC | PRN
  Administered 2015-09-25: 10 mL via INTRAVENOUS
  Filled 2015-09-25: qty 10

## 2015-09-25 MED ORDER — BORTEZOMIB CHEMO SQ INJECTION 3.5 MG (2.5MG/ML)
1.5000 mg/m2 | Freq: Once | INTRAMUSCULAR | Status: AC
  Administered 2015-09-25: 2.5 mg via SUBCUTANEOUS
  Filled 2015-09-25: qty 2.5

## 2015-09-25 MED ORDER — HEPARIN SOD (PORK) LOCK FLUSH 100 UNIT/ML IV SOLN
500.0000 [IU] | Freq: Once | INTRAVENOUS | Status: AC | PRN
  Administered 2015-09-25: 500 [IU] via INTRAVENOUS
  Filled 2015-09-25: qty 5

## 2015-09-25 MED ORDER — ONDANSETRON HCL 8 MG PO TABS
ORAL_TABLET | ORAL | Status: AC
  Filled 2015-09-25: qty 1

## 2015-09-25 MED ORDER — SODIUM CHLORIDE 0.9 % IV SOLN
300.0000 mg/m2 | Freq: Once | INTRAVENOUS | Status: AC
  Administered 2015-09-25: 480 mg via INTRAVENOUS
  Filled 2015-09-25: qty 24

## 2015-09-25 MED ORDER — ONDANSETRON HCL 8 MG PO TABS
8.0000 mg | ORAL_TABLET | Freq: Once | ORAL | Status: AC
  Administered 2015-09-25: 8 mg via ORAL

## 2015-09-25 NOTE — Patient Instructions (Signed)
[facility] Discharge Instructions for Patients Receiving Chemotherapy  Today you received the following chemotherapy agents: Cytoxan and Velcade  To help prevent nausea and vomiting after your treatment, we encourage you to take your nausea medication as directed.    If you develop nausea and vomiting that is not controlled by your nausea medication, call the clinic.   BELOW ARE SYMPTOMS THAT SHOULD BE REPORTED IMMEDIATELY:  *FEVER GREATER THAN 100.5 F  *CHILLS WITH OR WITHOUT FEVER  NAUSEA AND VOMITING THAT IS NOT CONTROLLED WITH YOUR NAUSEA MEDICATION  *UNUSUAL SHORTNESS OF BREATH  *UNUSUAL BRUISING OR BLEEDING  TENDERNESS IN MOUTH AND THROAT WITH OR WITHOUT PRESENCE OF ULCERS  *URINARY PROBLEMS  *BOWEL PROBLEMS  UNUSUAL RASH Items with * indicate a potential emergency and should be followed up as soon as possible.  Feel free to call the clinic you have any questions or concerns. The clinic phone number is [phone].  Please show the CHEMO ALERT CARD at check-in to the Emergency Department and triage nurse.   

## 2015-10-09 ENCOUNTER — Other Ambulatory Visit (HOSPITAL_BASED_OUTPATIENT_CLINIC_OR_DEPARTMENT_OTHER): Payer: BLUE CROSS/BLUE SHIELD

## 2015-10-09 ENCOUNTER — Ambulatory Visit (HOSPITAL_BASED_OUTPATIENT_CLINIC_OR_DEPARTMENT_OTHER): Payer: BLUE CROSS/BLUE SHIELD

## 2015-10-09 ENCOUNTER — Ambulatory Visit (HOSPITAL_BASED_OUTPATIENT_CLINIC_OR_DEPARTMENT_OTHER): Payer: BLUE CROSS/BLUE SHIELD | Admitting: Hematology

## 2015-10-09 ENCOUNTER — Encounter: Payer: Self-pay | Admitting: Hematology

## 2015-10-09 VITALS — BP 107/64 | HR 90 | Temp 98.2°F | Resp 16 | Ht 63.0 in | Wt 123.4 lb

## 2015-10-09 DIAGNOSIS — E876 Hypokalemia: Secondary | ICD-10-CM

## 2015-10-09 DIAGNOSIS — K219 Gastro-esophageal reflux disease without esophagitis: Secondary | ICD-10-CM

## 2015-10-09 DIAGNOSIS — Z5112 Encounter for antineoplastic immunotherapy: Secondary | ICD-10-CM

## 2015-10-09 DIAGNOSIS — C9010 Plasma cell leukemia not having achieved remission: Secondary | ICD-10-CM

## 2015-10-09 DIAGNOSIS — C9011 Plasma cell leukemia in remission: Secondary | ICD-10-CM

## 2015-10-09 DIAGNOSIS — B181 Chronic viral hepatitis B without delta-agent: Secondary | ICD-10-CM

## 2015-10-09 LAB — CBC WITH DIFFERENTIAL/PLATELET
BASO%: 0 % (ref 0.0–2.0)
Basophils Absolute: 0 10*3/uL (ref 0.0–0.1)
EOS%: 0 % (ref 0.0–7.0)
Eosinophils Absolute: 0 10*3/uL (ref 0.0–0.5)
HCT: 33.3 % — ABNORMAL LOW (ref 34.8–46.6)
HEMOGLOBIN: 10.8 g/dL — AB (ref 11.6–15.9)
LYMPH%: 10.5 % — ABNORMAL LOW (ref 14.0–49.7)
MCH: 29 pg (ref 25.1–34.0)
MCHC: 32.5 g/dL (ref 31.5–36.0)
MCV: 89.2 fL (ref 79.5–101.0)
MONO#: 0.1 10*3/uL (ref 0.1–0.9)
MONO%: 1.3 % (ref 0.0–14.0)
NEUT%: 88.2 % — ABNORMAL HIGH (ref 38.4–76.8)
NEUTROS ABS: 4 10*3/uL (ref 1.5–6.5)
Platelets: 277 10*3/uL (ref 145–400)
RBC: 3.73 10*6/uL (ref 3.70–5.45)
RDW: 15.2 % — AB (ref 11.2–14.5)
WBC: 4.6 10*3/uL (ref 3.9–10.3)
lymph#: 0.5 10*3/uL — ABNORMAL LOW (ref 0.9–3.3)

## 2015-10-09 LAB — COMPREHENSIVE METABOLIC PANEL
ALT: 21 U/L (ref 0–55)
AST: 44 U/L — AB (ref 5–34)
Albumin: 3.3 g/dL — ABNORMAL LOW (ref 3.5–5.0)
Alkaline Phosphatase: 232 U/L — ABNORMAL HIGH (ref 40–150)
Anion Gap: 11 mEq/L (ref 3–11)
BUN: 11.8 mg/dL (ref 7.0–26.0)
CHLORIDE: 107 meq/L (ref 98–109)
CO2: 24 meq/L (ref 22–29)
CREATININE: 0.7 mg/dL (ref 0.6–1.1)
Calcium: 9.7 mg/dL (ref 8.4–10.4)
EGFR: >90 mL/min/{1.73_m2} (ref >90)
Glucose: 185 mg/dl — ABNORMAL HIGH (ref 70–140)
POTASSIUM: 3.6 meq/L (ref 3.5–5.1)
SODIUM: 142 meq/L (ref 136–145)
Total Bilirubin: <0.3 mg/dL (ref 0.20–1.20)
Total Protein: 8 g/dL (ref 6.4–8.3)

## 2015-10-09 MED ORDER — HEPARIN SOD (PORK) LOCK FLUSH 100 UNIT/ML IV SOLN
500.0000 [IU] | Freq: Once | INTRAVENOUS | Status: AC
  Administered 2015-10-09: 500 [IU] via INTRAVENOUS
  Filled 2015-10-09: qty 5

## 2015-10-09 MED ORDER — ONDANSETRON HCL 8 MG PO TABS
8.0000 mg | ORAL_TABLET | Freq: Once | ORAL | Status: AC
  Administered 2015-10-09: 8 mg via ORAL

## 2015-10-09 MED ORDER — SODIUM CHLORIDE 0.9% FLUSH
10.0000 mL | Freq: Once | INTRAVENOUS | Status: AC
  Administered 2015-10-09: 10 mL via INTRAVENOUS
  Filled 2015-10-09: qty 10

## 2015-10-09 MED ORDER — BORTEZOMIB CHEMO SQ INJECTION 3.5 MG (2.5MG/ML)
1.5000 mg/m2 | Freq: Once | INTRAMUSCULAR | Status: AC
  Administered 2015-10-09: 2.5 mg via SUBCUTANEOUS
  Filled 2015-10-09: qty 2.5

## 2015-10-09 MED ORDER — ONDANSETRON HCL 8 MG PO TABS
ORAL_TABLET | ORAL | Status: AC
  Filled 2015-10-09: qty 1

## 2015-10-09 MED ORDER — SODIUM CHLORIDE 0.9 % IV SOLN
300.0000 mg/m2 | Freq: Once | INTRAVENOUS | Status: AC
  Administered 2015-10-09: 480 mg via INTRAVENOUS
  Filled 2015-10-09: qty 24

## 2015-10-09 MED ORDER — SODIUM CHLORIDE 0.9% FLUSH
10.0000 mL | INTRAVENOUS | Status: AC | PRN
  Administered 2015-10-09: 10 mL via INTRAVENOUS
  Filled 2015-10-09: qty 10

## 2015-10-09 MED ORDER — DEXAMETHASONE 4 MG PO TABS: ORAL_TABLET | ORAL | 1 refills | Status: DC

## 2015-10-09 MED ORDER — OMEPRAZOLE 20 MG PO CPDR: 20.0000 mg | DELAYED_RELEASE_CAPSULE | Freq: Every day | ORAL | 3 refills | Status: DC

## 2015-10-09 NOTE — Progress Notes (Signed)
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      10/10/2014 Miscellaneous    Peripheral blood chemistry and leukocytosis with total white count 78K, comprised of large plasma cells and his normocytic anemia. There is a myeloid left shift with previous surgical radium blasts. Flow cytometry showed 64% plasma cells      10/10/2014 Bone Marrow Biopsy    Markedly hypercellular marrow (95%), Atypical plasma cells comprise 57% of the cellularity. There was diminished multilineage in hematopoiesis with adequate maturation. Breasts less than 1%), no overt dysplasia of the myeloid or erythroid lineages.       10/10/2014 Initial Diagnosis    Plasma cell leukemia      10/13/2014 - 02/22/2015 Chemotherapy    CyborD (cytoxan 341m/m2 iv, bortezomib 1.5 mg/m, dexamethasone 40 mg, weekly every 28 days, bortezomib and dexamethasone was given twice weekly for 2 weeks during the first cycle)      04/04/2015 Bone Marrow Transplant    [facility] was complicated by sepsis from Escherichia coli bacteremia and associated colitis, she was discharged home on 04/27/2015.      05/07/2015 - 05/12/2015 Hospital Admission    [facility], [facility]. ID workup was negative, EGD showed evidence of gastritis and duodenitis, no H. pylori or CMV.      05/20/2015 - 05/24/2015 Hospital Admission    [facility]      07/11/2015 Bone Marrow Biopsy    Post transplant 100 a bone marrow biopsy showed hypocellular marrow, 20%, no increase in plasma cells (2%) or other abnormalities.      08/22/2015 -  Chemotherapy    MaintenanceCyborD (cytoxan 3085mm2 iv, bortezomib 1.5 mg/m, dexamethasone 40 mg, every 2 weeks       HISTORY OF PRESENTING ILLNESS:  Sheryl [name] [age].o. female is here because of recently diagnosed plasma cell leukemia. [facility] and is here to establish her local oncological care with [place] is accompanied to the clinic by her daughter and interpreter.  She presented to our hospital lth with worsening dyspnea, fatigue, cough, subjective fevers and chills, and was admitted on 09/14/2014. She was found to have allergy WBC 54.1K, hemoglobin 8.1, plt 310, she was treated with symptom management, and was discharged home on 09/17/2014, with a plan to follow-up with hematology. She represents to emergency room on 10/07/2014, was found to have white count of 78K, and worsening anemia with hemoglobin 6. She was seen by my partner Dr. EnJonette Evand plasma cell leukemia was suspected. [facility] Bone marrow biopsy was done, which confirmed plasma cell leukemia, and she started chemotherapy with Velcade, Cytoxan and dexamethasone. She received weekly dose twice, last dose on 10/20/2014. She was  discharged to home afterwards. She had a mediport placed during her hospitalization.  She has moderate fatigue, low appetie, she lost about 13 lbs in the past few months. She has moderate abdominal pain, 5-6/10, persistent, she does not take any pain meds.   I reviewed her medical records extensively, and discussed her case  with Dr. Jorje Guild and [place] program coordinator.  CURRENT THERAPY:  Maintenance chemotherapy cyBorD every 2 weeks, starts on 08/22/2015   INTERIM HISTORY: Sheryl [name] returns for follow up and 4th cycle chemo. She tolerated the first two cycle chemo well, but developed muscular pain and diarrhea for 3-4 days after the third cycle chemo, and recovered well afterwards. She has mild tinglings on her fingers, but her hand functions are normal, no issues with balance. She has good appetite and energy level, has been working for 2 days a week. No other complains. She is accompanied by her mother to the clinic today.   MEDICAL HISTORY:  Past Medical History:  Diagnosis Date  . Chills with fever    intermittently since d/c from hospital  . Dysuria-frequency syndrome    w/ pink urine  . GERD (gastroesophageal reflux disease)   . Hepatitis   . History of positive PPD    DX 2011--  CXR DONE NO EVIDENCE  . History of ureter stent   . Hydronephrosis, right   . Neuromuscular disorder (HCC)    legs numb intermittently  . Pneumonia   . Right ureteral stone   . Urosepsis 8/14   admitted to [facility]    SURGICAL HISTORY: Past Surgical History:  Procedure Laterality Date  . CYSTOSCOPY W/ URETERAL STENT PLACEMENT Right 09/25/2012   Procedure: CYSTOSCOPY WITH RETROGRADE PYELOGRAM/URETERAL STENT PLACEMENT;  Surgeon: Alexis Frock, MD;  Location: [facility];  Service: Urology;  Laterality: Right;  . CYSTOSCOPY WITH RETROGRADE PYELOGRAM, URETEROSCOPY AND STENT PLACEMENT Right 10/15/2012   Procedure: CYSTOSCOPY WITH RETROGRADE PYELOGRAM, URETEROSCOPY AND REMOVAL STENT WITH  STENT PLACEMENT;  Surgeon: Alexis Frock, MD;  Location: [facility];  Service: Urology;  Laterality: Right;  . ESOPHAGOGASTRODUODENOSCOPY (EGD) WITH PROPOFOL N/A 11/16/2014   Procedure: ESOPHAGOGASTRODUODENOSCOPY (EGD) WITH PROPOFOL;  Surgeon: Milus Banister, MD;  Location: [facility];  Service: Endoscopy;  Laterality: N/A;   . HOLMIUM LASER APPLICATION Right 4/50/3888   Procedure: HOLMIUM LASER APPLICATION;  Surgeon: Alexis Frock, MD;  Location: [facility];  Service: Urology;  Laterality: Right;  . LIVER BIOPSY    . OTHER SURGICAL HISTORY Right    removal of ovarian cyst  . removal of uterine cyst     years ago  . RIGHT VATS W/ DRAINAGE PEURAL EFFUSION AND BX'S  10-30-2008    SOCIAL HISTORY: Social History   Social History  . Marital Status: Single    Spouse Name: N/A  . Number of Children: 2  . Years of Education: N/A   Occupational History  . She used to work at a [employer] secretary    Social History Main Topics  . Smoking status: Never Smoker   . Smokeless tobacco: Not on file  . Alcohol Use: No  . Drug Use: No  . Sexual Activity: Not Currently    Birth Control/ Protection: Abstinence   Other Topics Concern  . Not on file   Social History Narrative    FAMILY HISTORY: Family History  Problem Relation Age of Onset  . Stomach cancer Mother   . Lung disease Father   . Asthma Father     ALLERGIES:  has No Known Allergies.  MEDICATIONS:  Current Outpatient Prescriptions  Medication Sig Dispense Refill  . acyclovir (ZOVIRAX) 800 MG tablet Take 800 mg by mouth 2 (two) times daily.    Marland Kitchen dexamethasone (DECADRON) 4 MG tablet On the day of chemo treatment every 2 weeks 40 tablet 1  . entecavir (BARACLUDE) 0.5 MG tablet Take 0.5 mg by mouth daily.    . nitrofurantoin, macrocrystal-monohydrate, (MACROBID) 100 MG capsule Take 1 capsule (100 mg total) by mouth 2 (two) times daily. 14 capsule 0  . omeprazole (PRILOSEC) 20 MG capsule Take 1 capsule (20 mg total) by mouth daily. 30 capsule 3  . sulfamethoxazole-trimethoprim (BACTRIM DS,SEPTRA DS) 800-160 MG tablet Take 1 tablet by mouth every Monday, Wednesday, and Friday. Reported on 06/13/2015 20 tablet 1  . polyethylene glycol (MIRALAX / GLYCOLAX) packet Take 17 g by mouth as needed for moderate constipation. 28 each 1  . potassium  chloride SA (K-DUR,KLOR-CON) 20 MEQ tablet Take 1 tablet (20 mEq total) by mouth daily. Take 1 tablet ( 20 meq ) by mouth daily for 7 days;  Then as directed. (Patient not taking: Reported on 10/09/2015) 20 tablet 0   No current facility-administered medications for this visit.    Facility-Administered Medications Ordered in Other Visits  Medication Dose Route Frequency Provider Last Rate Last Dose  . sodium chloride flush (NS) 0.9 % injection 10 mL  10 mL Intravenous PRN Truitt Merle, MD   10 mL at 10/09/15 1717    REVIEW OF SYSTEMS:   Constitutional: (+) intermittent fevers, no chills or abnormal night sweats Eyes: Denies blurriness of vision, double vision or watery eyes Ears, nose, mouth, throat, and face: Denies mucositis or sore throat Respiratory: Denies cough, dyspnea or wheezes Cardiovascular: Denies palpitation, chest discomfort or lower extremity swelling Gastrointestinal:  Denies nausea, heartburn or change in bowel habits Skin: Denies abnormal skin rashes Lymphatics: Denies new lymphadenopathy or easy bruising Neurological:Denies numbness, tingling or new weaknesses Behavioral/Psych: Mood is stable, no new changes  All other systems were reviewed with the patient and are negative.  PHYSICAL EXAMINATION: ECOG PERFORMANCE STATUS: 1  Vitals:   10/09/15 1432  BP: 107/64  Pulse: 90  Resp: 16  Temp: 98.2 F (36.8 C)   Filed Weights   10/09/15 1432  Weight: 123 lb 6.4 oz (56 kg)    GENERAL:alert, no distress and comfortable SKIN: skin color, texture, turgor are normal, no rashes or significant lesions EYES: normal, conjunctiva are pink and non-injected, sclera clear OROPHARYNX:no exudate, no erythema and lips, buccal mucosa, and tongue normal  NECK: supple, thyroid normal size, non-tender, without nodularity LYMPH:  no palpable lymphadenopathy in the cervical, axillary or inguinal LUNGS: clear to auscultation and percussion with normal breathing effort HEART: regular rate  & rhythm and no murmurs and no lower extremity edema ABDOMEN:abdomen soft, non-tender and normal bowel sounds Musculoskeletal:no cyanosis of digits and no clubbing  PSYCH: alert & oriented x 3 with fluent speech NEURO: no focal motor/sensory deficits  LABORATORY DATA:  I have reviewed the data as listed CBC Latest Ref Rng & Units 10/09/2015 09/25/2015 09/11/2015  WBC 3.9 - 10.3 10e3/uL 4.6 9.3 9.1  Hemoglobin 11.6 - 15.9 g/dL 10.8(L) 11.8 10.6(L)  Hematocrit 34.8 - 46.6 % 33.3(L) 36.2 32.6(L)  Platelets 145 - 400 10e3/uL 277 214 179    CMP Latest Ref Rng & Units 10/09/2015 09/25/2015 09/11/2015  Glucose 70 - 140 mg/dl 185(H) 101 127  BUN 7.0 - 26.0 mg/dL 11.8 16.5 21.7  Creatinine  0.6 - 1.1 mg/dL 0.7 0.8 0.7  Sodium 136 - 145 mEq/L 142 140 142  Potassium 3.5 - 5.1 mEq/L 3.6 4.6 3.3(L)  Chloride 101 - 111 mmol/L - - -  CO2 22 - 29 mEq/L _0 Calcium 8.4 - 10.4 mg/dL 9.7 9.9 10.1  Total Protein 6.4 - 8.3 g/dL 8.0 8.0 7.6  Total Bilirubin 0.20 - 1.20 mg/dL <0.30 <0.30 <0.30  Alkaline Phos 40 - 150 U/L 232(H) 189(H) 205(H)  AST 5 - 34 U/L 44(H) 38(H) 32  ALT 0 - 55 U/L _1 SPEP M-protein  09/15/2014: 4.2 10/08/14: 4.6 12/08/2014: 0.2 02/15/2015: not sufficient sample for test   09/11/2015: not observed   IgG mg/dl 11/03/2014: 3150  12/08/2014:  759 02/14/2014: 860 09/11/2015: 1347  Kappa/lambda light chains levels and ration  11/03/14: 0.75, 152, 0.00 12/22/2014: 1.10, 2.60, 0.42 02/15/2015: 9.59,14.35, 0.67 09/11/2015: 2.44, 2.72, 0.90  24 h urine UPEP/IFE and light chain: 11/03/2014: IFE showed a monoclonal IgG heavy chain with associated lambda light chain. M protein was Undetectable    PATHOLOGY REPORT  Bone Marrow (BM) and Peripheral Blood (PB) FINAL PATHOLOGIC DIAGNOSIS  BONE MARROW: 07/11/2015 Hypocellular marrow (20%) with no increase in plasma cells (2%). See comment.  PERIPHERAL BLOOD: Mild anemia. No circulating plasma cells identified. See comment and CBC  data.  COMMENT: The bone marrow core biopsy and clot section are hypocellular for age (20%, 10-40% range). There is no increase in plasma cells, which is confirmed by CD138 immunohistochemistry. Myeloid and erythroid elements are present in normal proportion. Megakaryocytes are morphologically unremarkable. There are no atypical lymphoid aggregates or granulomas.  The bone marrow aspirate smears are adequate for evaluation. Plasma cells are not increased in numbers (2%). There is no increase in blasts (<1%). Myeloid and erythroid elements are present in normal proportion with a M:E ratio of 2.5:1. There is no overt dysplasia of the myeloid, erythroid, or megakaryocytic lineages. There is no lymphocytosis.  Examination of peripheral blood reveals no circulating plasma cells or rouleaux formation. The background leukocytes are morphologically unremarkable and consist mostly of neutrophils.  Immunohistochemical controls worked appropriately.   RADIOGRAPHIC STUDIES: I have personally reviewed the radiological images as listed and agreed with the findings in the report. No results found.  ASSESSMENT & PLAN: 45 year old [place], presented with anemia and leokocytosis   1. 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 Norma Fredrickson recommendation to start maintenance chemotherapy with CyBorD every 2 weeks, for at least 4 months, then monthly.  -She is tolerating chemotherapy well, lab results reviewed with her, adequate for treatment, we'll proceed chemotherapy today. -We'll continue monitoring her SPEP, quantitative immunoglobulin and light chains level every 4 weeks. Lab from 4 weeks showed no  detected M-protein, IgG and light chain ration were normal. Today's results still pending. -continue acyclovir   2. Chronic Hepatitis B  - She will follow-up GI clinic at [facility] -continue entecavir   3. Hypokalemia -Her potassium 3.6 today -She will continue potassium 20 mg daily  4. GERD -Continue omeprazole daily. We discussed steroids can worsen her acid reflux.  Plan -4th cycle cyBorD today and continue every 2 weeks -I'll see her back in 4 weeks. -  I refilled her dexa and omeprezole   All questions were answered. The patient knows to call the clinic with any problems, questions or concerns.  I spent 20 minutes counseling the patient face to face. The total time spent in the appointment was 30 minutes and more than 50% was on counseling.     Truitt Merle, MD 10/09/2015

## 2015-10-09 NOTE — Patient Instructions (Signed)
[facility] Discharge Instructions for Patients Receiving Chemotherapy  Today you received the following chemotherapy agents:  Velcade and Cytoxan.  To help prevent nausea and vomiting after your treatment, we encourage you to take your nausea medication as directed.   If you develop nausea and vomiting that is not controlled by your nausea medication, call the clinic.   BELOW ARE SYMPTOMS THAT SHOULD BE REPORTED IMMEDIATELY:  *FEVER GREATER THAN 100.5 F  *CHILLS WITH OR WITHOUT FEVER  NAUSEA AND VOMITING THAT IS NOT CONTROLLED WITH YOUR NAUSEA MEDICATION  *UNUSUAL SHORTNESS OF BREATH  *UNUSUAL BRUISING OR BLEEDING  TENDERNESS IN MOUTH AND THROAT WITH OR WITHOUT PRESENCE OF ULCERS  *URINARY PROBLEMS  *BOWEL PROBLEMS  UNUSUAL RASH Items with * indicate a potential emergency and should be followed up as soon as possible.  Feel free to call the clinic you have any questions or concerns. The clinic phone number is [phone].  Please show the CHEMO ALERT CARD at check-in to the Emergency Department and triage nurse.   

## 2015-10-09 NOTE — Addendum Note (Signed)
Addended by: Doristine Section L on: 10/09/2015 03:54 PM   Modules accepted: Orders, SmartSet

## 2015-10-10 LAB — KAPPA/LAMBDA LIGHT CHAINS
Ig Kappa Free Light Chain: 30.9 mg/L — ABNORMAL HIGH (ref 3.3–19.4)
Ig Lambda Free Light Chain: 34.9 mg/L — ABNORMAL HIGH (ref 5.7–26.3)
KAPPA/LAMBDA FLC RATIO: 0.89 (ref 0.26–1.65)

## 2015-10-12 LAB — MULTIPLE MYELOMA PANEL, SERUM
ALBUMIN SERPL ELPH-MCNC: 3.5 g/dL (ref 2.9–4.4)
ALBUMIN/GLOB SERPL: 1 (ref 0.7–1.7)
ALPHA 1: 0.3 g/dL (ref 0.0–0.4)
Alpha2 Glob SerPl Elph-Mcnc: 1 g/dL (ref 0.4–1.0)
B-Globulin SerPl Elph-Mcnc: 1.1 g/dL (ref 0.7–1.3)
Gamma Glob SerPl Elph-Mcnc: 1.4 g/dL (ref 0.4–1.8)
Globulin, Total: 3.7 g/dL (ref 2.2–3.9)
IGA/IMMUNOGLOBULIN A, SERUM: 186 mg/dL (ref 87–352)
IGM (IMMUNOGLOBIN M), SRM: 68 mg/dL (ref 26–217)
IgG, Qn, Serum: 1457 mg/dL (ref 700–1600)
Total Protein: 7.2 g/dL (ref 6.0–8.5)

## 2015-10-22 ENCOUNTER — Telehealth: Payer: Self-pay | Admitting: Hematology

## 2015-10-22 ENCOUNTER — Telehealth: Payer: Self-pay | Admitting: *Deleted

## 2015-10-22 NOTE — Telephone Encounter (Signed)
Patient called to reschedule 9/19 lab/flush/fu - tx remains. Called patient  via [employer] and left message re cancellation of 9/19 appointments and asked that patient call office re reschedule as she needs to keep 9/19 appointments (tx involved). Left message informing desk nurse.

## 2015-10-22 NOTE — Telephone Encounter (Signed)
Called pt at home and spoke with daughter [name].  Per [name], pt has appt with the hepatitis clinic in [place] at 10 am  Tues 9/19 - which is in conflict with appts with this clinic.   Gave Goi new time for appts on Tues 9/19   -   130 pm lab, 2 pm office visit , and chemo to follow visit.  Goi stated she would relay message to pt.  Instructed Goi to tell pt that it is VERY Important for pt to keep appts with this clinic.  Goi voiced understanding.

## 2015-10-23 ENCOUNTER — Ambulatory Visit (HOSPITAL_BASED_OUTPATIENT_CLINIC_OR_DEPARTMENT_OTHER): Payer: BLUE CROSS/BLUE SHIELD

## 2015-10-23 ENCOUNTER — Ambulatory Visit: Payer: BLUE CROSS/BLUE SHIELD

## 2015-10-23 ENCOUNTER — Ambulatory Visit (HOSPITAL_BASED_OUTPATIENT_CLINIC_OR_DEPARTMENT_OTHER): Payer: BLUE CROSS/BLUE SHIELD | Admitting: Hematology

## 2015-10-23 ENCOUNTER — Other Ambulatory Visit: Payer: BLUE CROSS/BLUE SHIELD

## 2015-10-23 ENCOUNTER — Other Ambulatory Visit (HOSPITAL_BASED_OUTPATIENT_CLINIC_OR_DEPARTMENT_OTHER): Payer: BLUE CROSS/BLUE SHIELD

## 2015-10-23 ENCOUNTER — Ambulatory Visit: Payer: BLUE CROSS/BLUE SHIELD | Admitting: Hematology

## 2015-10-23 ENCOUNTER — Encounter: Payer: Self-pay | Admitting: Hematology

## 2015-10-23 VITALS — BP 104/68 | HR 91 | Temp 98.4°F | Resp 17 | Ht 63.0 in | Wt 125.1 lb

## 2015-10-23 DIAGNOSIS — E119 Type 2 diabetes mellitus without complications: Secondary | ICD-10-CM | POA: Diagnosis not present

## 2015-10-23 DIAGNOSIS — C9011 Plasma cell leukemia in remission: Secondary | ICD-10-CM

## 2015-10-23 DIAGNOSIS — Z5112 Encounter for antineoplastic immunotherapy: Secondary | ICD-10-CM | POA: Diagnosis not present

## 2015-10-23 DIAGNOSIS — B181 Chronic viral hepatitis B without delta-agent: Secondary | ICD-10-CM

## 2015-10-23 DIAGNOSIS — K219 Gastro-esophageal reflux disease without esophagitis: Secondary | ICD-10-CM

## 2015-10-23 DIAGNOSIS — D649 Anemia, unspecified: Secondary | ICD-10-CM

## 2015-10-23 DIAGNOSIS — C9010 Plasma cell leukemia not having achieved remission: Secondary | ICD-10-CM

## 2015-10-23 DIAGNOSIS — Z5111 Encounter for antineoplastic chemotherapy: Secondary | ICD-10-CM

## 2015-10-23 DIAGNOSIS — J040 Acute laryngitis: Secondary | ICD-10-CM

## 2015-10-23 DIAGNOSIS — E876 Hypokalemia: Secondary | ICD-10-CM

## 2015-10-23 LAB — CBC WITH DIFFERENTIAL/PLATELET
BASO%: 0 % (ref 0.0–2.0)
BASOS ABS: 0 10*3/uL (ref 0.0–0.1)
EOS ABS: 0 10*3/uL (ref 0.0–0.5)
EOS%: 0 % (ref 0.0–7.0)
HCT: 35.7 % (ref 34.8–46.6)
HEMOGLOBIN: 11.7 g/dL (ref 11.6–15.9)
LYMPH#: 0.4 10*3/uL — AB (ref 0.9–3.3)
LYMPH%: 6.9 % — AB (ref 14.0–49.7)
MCH: 29.6 pg (ref 25.1–34.0)
MCHC: 32.8 g/dL (ref 31.5–36.0)
MCV: 90.4 fL (ref 79.5–101.0)
MONO#: 0 10*3/uL — AB (ref 0.1–0.9)
MONO%: 0.5 % (ref 0.0–14.0)
NEUT%: 92.6 % — AB (ref 38.4–76.8)
NEUTROS ABS: 5.4 10*3/uL (ref 1.5–6.5)
Platelets: 215 10*3/uL (ref 145–400)
RBC: 3.95 10*6/uL (ref 3.70–5.45)
RDW: 14.6 % — ABNORMAL HIGH (ref 11.2–14.5)
WBC: 5.8 10*3/uL (ref 3.9–10.3)

## 2015-10-23 LAB — COMPREHENSIVE METABOLIC PANEL
ALT: 12 U/L (ref 0–55)
AST: 29 U/L (ref 5–34)
Albumin: 3.6 g/dL (ref 3.5–5.0)
Alkaline Phosphatase: 218 U/L — ABNORMAL HIGH (ref 40–150)
Anion Gap: 11 mEq/L (ref 3–11)
BUN: 16.7 mg/dL (ref 7.0–26.0)
CO2: 24 meq/L (ref 22–29)
CREATININE: 0.8 mg/dL (ref 0.6–1.1)
Calcium: 9.9 mg/dL (ref 8.4–10.4)
Chloride: 104 mEq/L (ref 98–109)
EGFR: 83 mL/min/{1.73_m2} — ABNORMAL LOW (ref >90)
GLUCOSE: 249 mg/dL — AB (ref 70–140)
Potassium: 3.7 mEq/L (ref 3.5–5.1)
SODIUM: 139 meq/L (ref 136–145)
TOTAL PROTEIN: 8.4 g/dL — AB (ref 6.4–8.3)

## 2015-10-23 MED ORDER — SODIUM CHLORIDE 0.9% FLUSH
10.0000 mL | INTRAVENOUS | Status: DC | PRN
  Administered 2015-10-23: 10 mL via INTRAVENOUS
  Filled 2015-10-23: qty 10

## 2015-10-23 MED ORDER — SODIUM CHLORIDE 0.9 % IV SOLN
300.0000 mg/m2 | Freq: Once | INTRAVENOUS | Status: AC
  Administered 2015-10-23: 480 mg via INTRAVENOUS
  Filled 2015-10-23: qty 24

## 2015-10-23 MED ORDER — ONDANSETRON HCL 8 MG PO TABS
ORAL_TABLET | ORAL | Status: AC
  Filled 2015-10-23: qty 1

## 2015-10-23 MED ORDER — ONDANSETRON HCL 8 MG PO TABS
8.0000 mg | ORAL_TABLET | Freq: Once | ORAL | Status: AC
  Administered 2015-10-23: 8 mg via ORAL

## 2015-10-23 MED ORDER — BORTEZOMIB CHEMO SQ INJECTION 3.5 MG (2.5MG/ML)
1.5000 mg/m2 | Freq: Once | INTRAMUSCULAR | Status: AC
  Administered 2015-10-23: 2.5 mg via SUBCUTANEOUS
  Filled 2015-10-23: qty 2.5

## 2015-10-23 MED ORDER — AZITHROMYCIN 500 MG PO TABS
500.0000 mg | ORAL_TABLET | Freq: Every day | ORAL | 0 refills | Status: DC
Start: 2015-10-23 — End: 2015-11-06

## 2015-10-23 MED ORDER — HEPARIN SOD (PORK) LOCK FLUSH 100 UNIT/ML IV SOLN
500.0000 [IU] | Freq: Once | INTRAVENOUS | Status: AC
Start: 2015-10-23 — End: 2015-10-23
  Administered 2015-10-23: 500 [IU] via INTRAVENOUS
  Filled 2015-10-23: qty 5

## 2015-10-23 MED ORDER — SODIUM CHLORIDE 0.9 % IV SOLN
Freq: Once | INTRAVENOUS | Status: AC
  Administered 2015-10-23: 15:00:00 via INTRAVENOUS

## 2015-10-23 NOTE — Progress Notes (Signed)
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      10/10/2014 Miscellaneous    Peripheral blood chemistry and leukocytosis with total white count 78K, comprised of large plasma cells and his normocytic anemia. There is a myeloid left shift with previous surgical radium blasts. Flow cytometry showed 64% plasma cells      10/10/2014 Bone Marrow Biopsy    Markedly hypercellular marrow (95%), Atypical plasma cells comprise 57% of the cellularity. There was diminished multilineage in hematopoiesis with adequate maturation. Breasts less than 1%), no overt dysplasia of the myeloid or erythroid lineages.       10/10/2014 Initial Diagnosis    Plasma cell leukemia      10/13/2014 - 02/22/2015 Chemotherapy    CyborD (cytoxan 326m/m2 iv, bortezomib 1.5 mg/m, dexamethasone 40 mg, weekly every 28 days, bortezomib and dexamethasone was given twice weekly for 2 weeks during the first cycle)      04/04/2015 Bone Marrow Transplant    [facility] was complicated by sepsis from Escherichia coli bacteremia and associated colitis, she was discharged home on 04/27/2015.      05/07/2015 - 05/12/2015 Hospital Admission    [facility], [facility]. ID workup was negative, EGD showed evidence of gastritis and duodenitis, no H. pylori or CMV.      05/20/2015 - 05/24/2015 Hospital Admission    [facility]      07/11/2015 Bone Marrow Biopsy    Post transplant 100 a bone marrow biopsy showed hypocellular marrow, 20%, no increase in plasma cells (2%) or other abnormalities.      08/22/2015 -  Chemotherapy    MaintenanceCyborD (cytoxan 3070mm2 iv, bortezomib 1.5 mg/m, dexamethasone 40 mg, every 2 weeks       HISTORY OF PRESENTING ILLNESS:  Sheryl [name] 45.o. female is here because of recently diagnosed plasma cell leukemia. She was recently discharged form [facility] days ago and is here to establish her local oncological care [place] is accompanied to the clinic by her daughter and interpreter.  She presented to our hospital lth with worsening dyspnea, fatigue, cough, subjective fevers and chills, and was admitted on 09/14/2014. She was found to have allergy WBC 54.1K, hemoglobin 8.1, plt 310, she was treated with symptom management, and was discharged home on 09/17/2014, with a plan to follow-up with hematology. She represents to emergency room on 10/07/2014, was found to have white count of 78K, and worsening anemia with hemoglobin 6. She was seen by my partner Dr. EnJonette Evand plasma cell leukemia was suspected. [facility] Bone marrow biopsy was done, which confirmed plasma cell leukemia, and she started chemotherapy with Velcade, Cytoxan and dexamethasone. She received weekly dose twice, last dose on 10/20/2014. She was  discharged to home afterwards. She had a mediport placed during her hospitalization.  She has moderate fatigue, low appetie, she lost about 13 lbs in the past few months. She has moderate abdominal pain, 5-6/10, persistent, she does not take any pain meds.   I reviewed her medical records extensively, and discussed her case  with Dr. Jorje Guild and [place] program coordinator.  CURRENT THERAPY:  Maintenance chemotherapy cyBorD every 2 weeks, starts on 08/22/2015   INTERIM HISTORY: Chastelyn returns for follow up and chemo. She has been tolerating chemo well overall. She reports constipation lately, she has not tried any medication yet, but plan to start colace. She also report being thirsty, and urinating more frequent, especially at night, 5-6 times, no dysuria or hematuria, no weight loss. She also started having some sore throat, no cough or sputum production, no fever, she is very concerned about she is going to develop laryngitis  MEDICAL HISTORY:  Past Medical History:  Diagnosis Date  . Chills with fever    intermittently since d/c from hospital  . Dysuria-frequency syndrome    w/ pink urine  . GERD (gastroesophageal reflux disease)   . Hepatitis   . History of positive PPD    DX 2011--  CXR DONE NO EVIDENCE  . History of ureter stent   . Hydronephrosis, right   . Neuromuscular disorder (HCC)    legs numb intermittently  . Pneumonia   . Right ureteral stone   . Urosepsis 8/14   ad[facility] to [facility]    SURGICAL HISTORY: Past Surgical History:  Procedure Laterality Date  . CYSTOSCOPY W/ URETERAL STENT PLACEMENT Right 09/25/2012   Procedure: CYSTOSCOPY WITH RETROGRADE PYELOGRAM/URETERAL STENT PLACEMENT;  Surgeon: Alexis Frock, MD;  Location: [facility];  Service: Urology;  Laterality: Right;  . CYSTOSCOPY WITH RETROGRADE PYELOGRAM, URETEROSCOPY AND STENT PLACEMENT Right 10/15/2012   Procedure: CYSTOSCOPY WITH RETROGRADE PYELOGRAM, URETEROSCOPY AND REMOVAL STENT WITH  STENT PLACEMENT;  Surgeon: Alexis Frock, MD;  Location: [facility];  Service: Urology;  Laterality: Right;  . ESOPHAGOGASTRODUODENOSCOPY (EGD) WITH PROPOFOL N/A 11/16/2014   Procedure: ESOPHAGOGASTRODUODENOSCOPY (EGD) WITH PROPOFOL;  Surgeon: Milus Banister, MD;  Location: [facility];  Service: Endoscopy;  Laterality: N/A;   . HOLMIUM LASER APPLICATION Right 03/07/3341   Procedure: HOLMIUM LASER APPLICATION;  Surgeon: Alexis Frock, MD;  Location: [facility];  Service: Urology;  Laterality: Right;  . LIVER BIOPSY    . OTHER SURGICAL HISTORY Right    removal of ovarian cyst  . removal of uterine cyst     years ago  . RIGHT VATS W/ DRAINAGE PEURAL EFFUSION AND BX'S  10-30-2008    SOCIAL HISTORY: Social History   Social History  . Marital Status: Single    Spouse Name: N/A  . Number of Children: 2  . Years of Education: N/A   Occupational History  . She used to work at a [employer] secretary    Social History Main Topics  . Smoking status: Never Smoker   . Smokeless tobacco: Not on file  . Alcohol Use: No  . Drug Use: No  . Sexual Activity: Not Currently    Birth Control/ Protection: Abstinence   Other Topics Concern  . Not on file   Social History Narrative    FAMILY HISTORY: Family History  Problem Relation Age of Onset  . Stomach cancer Mother   . Lung disease Father   . Asthma Father     ALLERGIES:  has No Known Allergies.  MEDICATIONS:  Current Outpatient Prescriptions  Medication Sig Dispense Refill  . acyclovir (ZOVIRAX) 800 MG tablet Take 800 mg by mouth 2 (two) times daily.    . clindamycin (CLEOCIN) 300 MG capsule Take 600 mg by mouth.    . cycloSPORINE (RESTASIS MULTIDOSE) 0.05 % ophthalmic emulsion Place 1 drop into both eyes 2 times daily.    Marland Kitchen dexamethasone (DECADRON) 4 MG tablet On the day of chemo treatment every 2 weeks 40 tablet 1  . entecavir (BARACLUDE) 0.5 MG tablet Take 0.5 mg by mouth daily.    Marland Kitchen lidocaine-prilocaine (EMLA) cream Apply topically.    . nitrofurantoin, macrocrystal-monohydrate, (MACROBID) 100 MG capsule Take 1 capsule (100 mg total) by mouth 2 (two) times daily. 14 capsule 0  . Olopatadine HCl (PAZEO) 0.7 % SOLN Place 1 drop into both eyes every morning.    Marland Kitchen omeprazole (PRILOSEC) 20 MG capsule Take 1 capsule (20 mg total) by mouth daily.  30 capsule 3  . polyethylene glycol (MIRALAX / GLYCOLAX) packet Take 17 g by mouth as needed for moderate constipation. 28 each 1  . sulfamethoxazole-trimethoprim (BACTRIM DS,SEPTRA DS) 800-160 MG tablet Take 1 tablet by mouth every Monday, Wednesday, and Friday. Reported on 06/13/2015 20 tablet 1  . azithromycin (ZITHROMAX) 500 MG tablet Take 1 tablet (500 mg total) by mouth daily. 3 tablet 0  . potassium chloride SA (K-DUR,KLOR-CON) 20 MEQ tablet Take 1 tablet (20 mEq total) by mouth daily. Take 1 tablet ( 20 meq ) by mouth daily for 7 days;  Then as directed. (Patient not taking: Reported on 10/23/2015) 20 tablet 0   No current facility-administered medications for this visit.    Facility-Administered Medications Ordered in Other Visits  Medication Dose Route Frequency Provider Last Rate Last Dose  . sodium chloride flush (NS) 0.9 % injection 10 mL  10 mL Intravenous PRN Truitt Merle, MD   10 mL at 10/09/15 1717    REVIEW OF SYSTEMS:   Constitutional: (+) intermittent fevers, no chills or abnormal night sweats Eyes: Denies blurriness of vision, double vision or watery eyes Ears, nose, mouth, throat, and face: Denies mucositis or sore throat Respiratory: Denies cough, dyspnea or wheezes Cardiovascular: Denies palpitation, chest discomfort or lower extremity swelling Gastrointestinal:  Denies nausea, heartburn or change in bowel habits Skin: Denies abnormal skin rashes Lymphatics: Denies new lymphadenopathy or easy bruising Neurological:Denies numbness, tingling or new weaknesses Behavioral/Psych: Mood is stable, no new changes  All other systems were reviewed with the patient and are negative.  PHYSICAL EXAMINATION: ECOG PERFORMANCE STATUS: 1  Vitals:   10/23/15 1410  BP: 104/68  Pulse: 91  Resp: 17  Temp: 98.4 F (36.9 C)   Filed Weights   10/23/15 1410  Weight: 125 lb 1.6 oz (56.7 kg)    GENERAL:alert, no distress and comfortable SKIN: skin color, texture, turgor are normal,  no rashes or significant lesions EYES: normal, conjunctiva are pink and non-injected, sclera clear OROPHARYNX:no exudate, no erythema and lips, buccal mucosa, and tongue normal  NECK: supple, thyroid normal size, non-tender, without nodularity LYMPH:  no palpable lymphadenopathy in the cervical, axillary or inguinal LUNGS: clear to auscultation and percussion with normal breathing effort HEART: regular rate & rhythm and no murmurs and no lower extremity edema ABDOMEN:abdomen soft, non-tender and normal bowel sounds Musculoskeletal:no cyanosis of digits and no clubbing  PSYCH: alert & oriented x 3 with fluent speech NEURO: no focal motor/sensory deficits  LABORATORY DATA:  I have reviewed the data as listed CBC Latest  Ref Rng & Units 10/23/2015 10/09/2015 09/25/2015  WBC 3.9 - 10.3 10e3/uL 5.8 4.6 9.3  Hemoglobin 11.6 - 15.9 g/dL 11.7 10.8(L) 11.8  Hematocrit 34.8 - 46.6 % 35.7 33.3(L) 36.2  Platelets 145 - 400 10e3/uL 215 277 214    CMP Latest Ref Rng & Units 10/23/2015 10/09/2015 10/09/2015  Glucose 70 - 140 mg/dl 249(H) 185(H) -  BUN 7.0 - 26.0 mg/dL 16.7 11.8 -  Creatinine 0.6 - 1.1 mg/dL 0.8 0.7 -  Sodium 136 - 145 mEq/L 139 142 -  Potassium 3.5 - 5.1 mEq/L 3.7 3.6 -  Chloride 101 - 111 mmol/L - - -  CO2 22 - 29 mEq/L 24 24 -  Calcium 8.4 - 10.4 mg/dL 9.9 9.7 -  Total Protein 6.4 - 8.3 g/dL 8.4(H) 8.0 7.2  Total Bilirubin 0.20 - 1.20 mg/dL <0.30 <0.30 -  Alkaline Phos 40 - 150 U/L 218(H) 232(H) -  AST 5 - 34 U/L 29 44(H) -  ALT 0 - 55 U/L 12 21 -     SPEP M-protein  09/15/2014: 4.2 10/08/14: 4.6 12/08/2014: 0.2 02/15/2015: not sufficient sample for test   09/11/2015: not observed   IgG mg/dl 11/03/2014: 3150  12/08/2014:  759 02/14/2014: 860 09/11/2015: 1347  Kappa/lambda light chains levels and ration  11/03/14: 0.75, 152, 0.00 12/22/2014: 1.10, 2.60, 0.42 02/15/2015: 9.59,14.35, 0.67 09/11/2015: 2.44, 2.72, 0.90  24 h urine UPEP/IFE and light chain: 11/03/2014: IFE showed a  monoclonal IgG heavy chain with associated lambda light chain. M protein was Undetectable    PATHOLOGY REPORT  Bone Marrow (BM) and Peripheral Blood (PB) FINAL PATHOLOGIC DIAGNOSIS  BONE MARROW: 07/11/2015 Hypocellular marrow (20%) with no increase in plasma cells (2%). See comment.  PERIPHERAL BLOOD: Mild anemia. No circulating plasma cells identified. See comment and CBC data.  COMMENT: The bone marrow core biopsy and clot section are hypocellular for age (20%, 10-40% range). There is no increase in plasma cells, which is confirmed by CD138 immunohistochemistry. Myeloid and erythroid elements are present in normal proportion. Megakaryocytes are morphologically unremarkable. There are no atypical lymphoid aggregates or granulomas.  The bone marrow aspirate smears are adequate for evaluation. Plasma cells are not increased in numbers (2%). There is no increase in blasts (<1%). Myeloid and erythroid elements are present in normal proportion with a M:E ratio of 2.5:1. There is no overt dysplasia of the myeloid, erythroid, or megakaryocytic lineages. There is no lymphocytosis.  Examination of peripheral blood reveals no circulating plasma cells or rouleaux formation. The background leukocytes are morphologically unremarkable and consist mostly of neutrophils.  Immunohistochemical controls worked appropriately.   RADIOGRAPHIC STUDIES: I have personally reviewed the radiological images as listed and agreed with the findings in the report. No results found.  ASSESSMENT & PLAN: 44 year old [place], presented with anemia and leokocytosis   1. 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 Norma Fredrickson recommendation to start maintenance chemotherapy with CyBorD every 2 weeks, for at least 4 months, then monthly.  -She is tolerating chemotherapy well, lab results reviewed with her, adequate for treatment, we'll proceed chemotherapy today. -We'll continue monitoring her SPEP, quantitative immunoglobulin and light chains level every 4 weeks. Lab from 4 weeks showed no detected M-protein, IgG and  light chain ration were normal.  -continue acyclovir   2. New onset diabetes  -She was noticed to have increased blood glucose level lately, it was 249 today, and 185 two weeks ago. She also developed diabetic symptoms. No history of diabetes in the past. -This is likely steroids induced hyperglycemia -I strongly recommend her to avoid any soft drink and sweats, and watch her carbohydrate intake, and exercise more  -Her prior primary care physician has left, she will find a new primary care physician as soon as possible. -I will call in metformin if she is not able to see her primary care physician in a week or 2.  3. Sore throat -She has a recurrent laryngitis in the past, she feels she is developing 1 now -I called in his azithromycin 500 mg daily for 3 days  4. Chronic Hepatitis B  - She will follow-up GI clinic at [facility] -continue entecavir   5. Hypokalemia -Her potassium 3.7 today -She will continue potassium 20 mg daily  6. GERD -Continue omeprazole daily. We discussed steroids can worsen her acid reflux.  Plan -will proceed cyBorD today and continue every 2 weeks -We discussed her newly diagnosed DM  -I'll see her back in 2 weeks. -she will see her new PCP asap   All questions were answered. The patient knows to call the clinic with any problems, questions or concerns.  I spent 30 minutes counseling the patient face to face. The total time spent in the appointment was 35 minutes and more than 50% was on counseling.     Truitt Merle, MD 10/23/2015

## 2015-10-23 NOTE — Patient Instructions (Signed)
[place] Discharge Instructions for Patients Receiving Chemotherapy  Today you received the following chemotherapy agents cytoxan/velcade  To help prevent nausea and vomiting after your treatment, we encourage you to take your nausea medication as directed  If you develop nausea and vomiting that is not controlled by your nausea medication, call the clinic.   BELOW ARE SYMPTOMS THAT SHOULD BE REPORTED IMMEDIATELY:  *FEVER GREATER THAN 100.5 F  *CHILLS WITH OR WITHOUT FEVER  NAUSEA AND VOMITING THAT IS NOT CONTROLLED WITH YOUR NAUSEA MEDICATION  *UNUSUAL SHORTNESS OF BREATH  *UNUSUAL BRUISING OR BLEEDING  TENDERNESS IN MOUTH AND THROAT WITH OR WITHOUT PRESENCE OF ULCERS  *URINARY PROBLEMS  *BOWEL PROBLEMS  UNUSUAL RASH Items with * indicate a potential emergency and should be followed up as soon as possible.  Feel free to call the clinic you have any questions or concerns. The clinic phone number is [phone].

## 2015-10-24 ENCOUNTER — Ambulatory Visit: Payer: BLUE CROSS/BLUE SHIELD | Admitting: Hematology

## 2015-10-29 ENCOUNTER — Telehealth: Payer: Self-pay | Admitting: Hematology

## 2015-10-29 NOTE — Telephone Encounter (Signed)
Left message for patient's dtr confirming cancellation of 9/26 appointments and next scheduled appointments for 10/3.

## 2015-10-30 ENCOUNTER — Ambulatory Visit: Payer: BLUE CROSS/BLUE SHIELD | Admitting: Hematology

## 2015-10-30 ENCOUNTER — Other Ambulatory Visit: Payer: BLUE CROSS/BLUE SHIELD

## 2015-11-06 ENCOUNTER — Ambulatory Visit: Payer: BLUE CROSS/BLUE SHIELD

## 2015-11-06 ENCOUNTER — Other Ambulatory Visit (HOSPITAL_BASED_OUTPATIENT_CLINIC_OR_DEPARTMENT_OTHER): Payer: BLUE CROSS/BLUE SHIELD

## 2015-11-06 ENCOUNTER — Telehealth: Payer: Self-pay | Admitting: Hematology

## 2015-11-06 ENCOUNTER — Ambulatory Visit (HOSPITAL_BASED_OUTPATIENT_CLINIC_OR_DEPARTMENT_OTHER): Payer: BLUE CROSS/BLUE SHIELD | Admitting: Hematology

## 2015-11-06 ENCOUNTER — Encounter: Payer: Self-pay | Admitting: Hematology

## 2015-11-06 ENCOUNTER — Ambulatory Visit (HOSPITAL_BASED_OUTPATIENT_CLINIC_OR_DEPARTMENT_OTHER): Payer: BLUE CROSS/BLUE SHIELD

## 2015-11-06 VITALS — BP 104/61 | HR 76 | Temp 98.3°F | Resp 17 | Ht 63.0 in | Wt 125.8 lb

## 2015-11-06 DIAGNOSIS — K219 Gastro-esophageal reflux disease without esophagitis: Secondary | ICD-10-CM

## 2015-11-06 DIAGNOSIS — C9011 Plasma cell leukemia in remission: Secondary | ICD-10-CM

## 2015-11-06 DIAGNOSIS — E119 Type 2 diabetes mellitus without complications: Secondary | ICD-10-CM

## 2015-11-06 DIAGNOSIS — Z95828 Presence of other vascular implants and grafts: Secondary | ICD-10-CM

## 2015-11-06 DIAGNOSIS — C9010 Plasma cell leukemia not having achieved remission: Secondary | ICD-10-CM

## 2015-11-06 DIAGNOSIS — T380X5A Adverse effect of glucocorticoids and synthetic analogues, initial encounter: Secondary | ICD-10-CM

## 2015-11-06 DIAGNOSIS — Z5112 Encounter for antineoplastic immunotherapy: Secondary | ICD-10-CM

## 2015-11-06 DIAGNOSIS — B181 Chronic viral hepatitis B without delta-agent: Secondary | ICD-10-CM

## 2015-11-06 DIAGNOSIS — E876 Hypokalemia: Secondary | ICD-10-CM | POA: Diagnosis not present

## 2015-11-06 DIAGNOSIS — E099 Drug or chemical induced diabetes mellitus without complications: Secondary | ICD-10-CM

## 2015-11-06 LAB — CBC WITH DIFFERENTIAL/PLATELET
BASO%: 0.2 % (ref 0.0–2.0)
Basophils Absolute: 0 10*3/uL (ref 0.0–0.1)
EOS%: 0 % (ref 0.0–7.0)
Eosinophils Absolute: 0 10*3/uL (ref 0.0–0.5)
HCT: 33.8 % — ABNORMAL LOW (ref 34.8–46.6)
HGB: 11 g/dL — ABNORMAL LOW (ref 11.6–15.9)
LYMPH%: 7.1 % — AB (ref 14.0–49.7)
MCH: 29.2 pg (ref 25.1–34.0)
MCHC: 32.4 g/dL (ref 31.5–36.0)
MCV: 90 fL (ref 79.5–101.0)
MONO#: 0.1 10*3/uL (ref 0.1–0.9)
MONO%: 1 % (ref 0.0–14.0)
NEUT%: 91.7 % — AB (ref 38.4–76.8)
NEUTROS ABS: 5.6 10*3/uL (ref 1.5–6.5)
Platelets: 254 10*3/uL (ref 145–400)
RBC: 3.76 10*6/uL (ref 3.70–5.45)
RDW: 15.6 % — ABNORMAL HIGH (ref 11.2–14.5)
WBC: 6.1 10*3/uL (ref 3.9–10.3)
lymph#: 0.4 10*3/uL — ABNORMAL LOW (ref 0.9–3.3)

## 2015-11-06 LAB — COMPREHENSIVE METABOLIC PANEL
ALBUMIN: 3.4 g/dL — AB (ref 3.5–5.0)
ALK PHOS: 163 U/L — AB (ref 40–150)
ALT: 10 U/L (ref 0–55)
ANION GAP: 10 meq/L (ref 3–11)
AST: 27 U/L (ref 5–34)
BUN: 10.3 mg/dL (ref 7.0–26.0)
CALCIUM: 9.4 mg/dL (ref 8.4–10.4)
CHLORIDE: 107 meq/L (ref 98–109)
CO2: 24 mEq/L (ref 22–29)
Creatinine: 0.7 mg/dL (ref 0.6–1.1)
Glucose: 198 mg/dl — ABNORMAL HIGH (ref 70–140)
POTASSIUM: 3.7 meq/L (ref 3.5–5.1)
Sodium: 142 mEq/L (ref 136–145)
Total Bilirubin: <0.3 mg/dL (ref 0.20–1.20)
Total Protein: 7.6 g/dL (ref 6.4–8.3)

## 2015-11-06 MED ORDER — ONDANSETRON HCL 8 MG PO TABS
ORAL_TABLET | ORAL | Status: AC
  Filled 2015-11-06: qty 1

## 2015-11-06 MED ORDER — BORTEZOMIB CHEMO SQ INJECTION 3.5 MG (2.5MG/ML)
1.5000 mg/m2 | Freq: Once | INTRAMUSCULAR | Status: AC
  Administered 2015-11-06: 2.5 mg via SUBCUTANEOUS
  Filled 2015-11-06: qty 2.5

## 2015-11-06 MED ORDER — ACYCLOVIR 800 MG PO TABS: 800.0000 mg | ORAL_TABLET | Freq: Two times a day (BID) | ORAL | 2 refills | Status: DC

## 2015-11-06 MED ORDER — HEPARIN SOD (PORK) LOCK FLUSH 100 UNIT/ML IV SOLN
500.0000 [IU] | Freq: Once | INTRAVENOUS | Status: AC | PRN
  Administered 2015-11-06: 500 [IU] via INTRAVENOUS
  Filled 2015-11-06: qty 5

## 2015-11-06 MED ORDER — SODIUM CHLORIDE 0.9 % IJ SOLN
10.0000 mL | INTRAMUSCULAR | Status: DC | PRN
  Administered 2015-11-06: 10 mL via INTRAVENOUS
  Filled 2015-11-06: qty 10

## 2015-11-06 MED ORDER — ONDANSETRON HCL 8 MG PO TABS
8.0000 mg | ORAL_TABLET | Freq: Once | ORAL | Status: AC
  Administered 2015-11-06: 8 mg via ORAL

## 2015-11-06 MED ORDER — SODIUM CHLORIDE 0.9 % IV SOLN
300.0000 mg/m2 | Freq: Once | INTRAVENOUS | Status: AC
  Administered 2015-11-06: 480 mg via INTRAVENOUS
  Filled 2015-11-06: qty 24

## 2015-11-06 NOTE — Patient Instructions (Signed)
[place] Discharge Instructions for Patients Receiving Chemotherapy  Today you received the following chemotherapy agents cytoxan/velcade  To help prevent nausea and vomiting after your treatment, we encourage you to take your nausea medication as directed  If you develop nausea and vomiting that is not controlled by your nausea medication, call the clinic.   BELOW ARE SYMPTOMS THAT SHOULD BE REPORTED IMMEDIATELY:  *FEVER GREATER THAN 100.5 F  *CHILLS WITH OR WITHOUT FEVER  NAUSEA AND VOMITING THAT IS NOT CONTROLLED WITH YOUR NAUSEA MEDICATION  *UNUSUAL SHORTNESS OF BREATH  *UNUSUAL BRUISING OR BLEEDING  TENDERNESS IN MOUTH AND THROAT WITH OR WITHOUT PRESENCE OF ULCERS  *URINARY PROBLEMS  *BOWEL PROBLEMS  UNUSUAL RASH Items with * indicate a potential emergency and should be followed up as soon as possible.  Feel free to call the clinic you have any questions or concerns. The clinic phone number is [phone].

## 2015-11-06 NOTE — Telephone Encounter (Signed)
GAVE PATIENT AVS REPORT AND APPOINTMENTS FOR October  °

## 2015-11-06 NOTE — Progress Notes (Signed)
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      10/10/2014 Miscellaneous    Peripheral blood chemistry and leukocytosis with total white count 78K, comprised of large plasma cells and his normocytic anemia. There is a myeloid left shift with previous surgical radium blasts. Flow cytometry showed 64% plasma cells      10/10/2014 Bone Marrow Biopsy    Markedly hypercellular marrow (95%), Atypical plasma cells comprise 57% of the cellularity. There was diminished multilineage in hematopoiesis with adequate maturation. Breasts less than 1%), no overt dysplasia of the myeloid or erythroid lineages.       10/10/2014 Initial Diagnosis    Plasma cell leukemia      10/13/2014 - 02/22/2015 Chemotherapy    CyborD (cytoxan 368m/m2 iv, bortezomib 1.5 mg/m, dexamethasone 40 mg, weekly every 28 days, bortezomib and dexamethasone was given twice weekly for 2 weeks during the first cycle)      04/04/2015 Bone Marrow Transplant    [facility] Her transplant course was complicated by sepsis from Escherichia coli bacteremia and associated colitis, she was discharged home on 04/27/2015.      05/07/2015 - 05/12/2015 Hospital Admission    [facility], [facility]. ID workup was negative, EGD showed evidence of gastritis and duodenitis, no H. pylori or CMV.      05/20/2015 - 05/24/2015 Hospital Admission    [facility]      07/11/2015 Bone Marrow Biopsy    Post transplant 100 a bone marrow biopsy showed hypocellular marrow, 20%, no increase in plasma cells (2%) or other abnormalities.      08/22/2015 -  Chemotherapy    MaintenanceCyborD (cytoxan 3047mm2 iv, bortezomib 1.5 mg/m, dexamethasone 40 mg, every 2 weeks       HISTORY OF PRESENTING ILLNESS:  Sheryl [name] [age].o. female is here because of recently diagnosed plasma cell leukemia. [facility] and is here to establish her local oncological care with [place] is accompanied to the clinic by her daughter and interpreter.  She presented to our hospital lth with worsening dyspnea, fatigue, cough, subjective fevers and chills, and was admitted on 09/14/2014. She was found to have allergy WBC 54.1K, hemoglobin 8.1, plt 310, she was treated with symptom management, and was discharged home on 09/17/2014, with a plan to follow-up with hematology. She represents to emergency room on 10/07/2014, was found to have white count of 78K, and worsening anemia with hemoglobin 6. She was seen by my partner Dr. EnJonette Evand plasma cell leukemia was suspected. [facility] Bone marrow biopsy was done, which confirmed plasma cell leukemia, and she started chemotherapy with Velcade, Cytoxan and dexamethasone. She received weekly dose twice, last dose on 10/20/2014. She was  discharged to home afterwards. She had a mediport placed during her hospitalization.  She has moderate fatigue, low appetie, she lost about 13 lbs in the past few months. She has moderate abdominal pain, 5-6/10, persistent, she does not take any pain meds.   I reviewed her medical records extensively, and discussed her case  with Dr. Jorje Guild and Zena program coordinator.  CURRENT THERAPY:  Maintenance chemotherapy cyBorD every 2 weeks, starts on 08/22/2015, will change to every 4 weeks after 4 months therapy   INTERIM HISTORY: Sheryl [name] returns for follow up and chemo. She is doing well overall. She has been tolerating chemotherapy well overall, she does have fatigue, mild leg pain after chemotherapy, recovers well. No fever or chills. No other new complaints. She did see her primary care physician after her last visit with me for her newly diagnosed with diabetes, and has some tests, but she has not heard the result yet.  MEDICAL HISTORY:  Past Medical History:  Diagnosis Date  . Chills with fever    intermittently since d/c from hospital  . Dysuria-frequency syndrome    w/ pink urine  . GERD (gastroesophageal reflux disease)   . Hepatitis   . History of positive PPD    DX 2011--  CXR DONE NO EVIDENCE  . History of ureter stent   . Hydronephrosis, right   . Neuromuscular disorder (HCC)    legs numb intermittently  . Pneumonia   . Right ureteral stone   . Urosepsis 8/14   admitted to [facility]    SURGICAL HISTORY: Past Surgical History:  Procedure Laterality Date  . CYSTOSCOPY W/ URETERAL STENT PLACEMENT Right 09/25/2012   Procedure: CYSTOSCOPY WITH RETROGRADE PYELOGRAM/URETERAL STENT PLACEMENT;  Surgeon: Alexis Frock, MD;  Location: [facility];  Service: Urology;  Laterality: Right;  . CYSTOSCOPY WITH RETROGRADE PYELOGRAM, URETEROSCOPY AND STENT PLACEMENT Right 10/15/2012   Procedure: CYSTOSCOPY WITH RETROGRADE PYELOGRAM, URETEROSCOPY AND REMOVAL STENT WITH  STENT PLACEMENT;  Surgeon: Alexis Frock, MD;  Location: [facility];  Service: Urology;  Laterality: Right;  . ESOPHAGOGASTRODUODENOSCOPY (EGD) WITH PROPOFOL N/A 11/16/2014   Procedure: ESOPHAGOGASTRODUODENOSCOPY (EGD) WITH PROPOFOL;  Surgeon: Milus Banister, MD;  Location: [facility];  Service: Endoscopy;  Laterality: N/A;  .  HOLMIUM LASER APPLICATION Right 5/80/9983   Procedure: HOLMIUM LASER APPLICATION;  Surgeon: Alexis Frock, MD;  Location[facility];  Service: Urology;  Laterality: Right;  . LIVER BIOPSY    . OTHER SURGICAL HISTORY Right    removal of ovarian cyst  . removal of uterine cyst     years ago  . RIGHT VATS W/ DRAINAGE PEURAL EFFUSION AND BX'S  10-30-2008    SOCIAL HISTORY: Social History   Social History  . Marital Status: Single    Spouse Name: N/A  . Number of Children: 2  . Years of Education: N/A   Occupational History  . She used to work at a [employer]    Social History Main Topics  . Smoking status: Never Smoker   . Smokeless tobacco: Not on file  . Alcohol Use: No  . Drug Use: No  . Sexual Activity: Not Currently    Birth Control/ Protection: Abstinence   Other Topics Concern  . Not on file   Social History Narrative    FAMILY HISTORY: Family History  Problem Relation Age of Onset  . Stomach cancer Mother   . Lung disease Father   . Asthma Father     ALLERGIES:  has No  Known Allergies.  MEDICATIONS:  Current Outpatient Prescriptions  Medication Sig Dispense Refill  . acyclovir (ZOVIRAX) 800 MG tablet Take 800 mg by mouth 2 (two) times daily.    Marland Kitchen azithromycin (ZITHROMAX) 500 MG tablet Take 1 tablet (500 mg total) by mouth daily. 3 tablet 0  . clindamycin (CLEOCIN) 300 MG capsule Take 600 mg by mouth.    . cycloSPORINE (RESTASIS MULTIDOSE) 0.05 % ophthalmic emulsion Place 1 drop into both eyes 2 times daily.    Marland Kitchen dexamethasone (DECADRON) 4 MG tablet On the day of chemo treatment every 2 weeks 40 tablet 1  . entecavir (BARACLUDE) 0.5 MG tablet Take 0.5 mg by mouth daily.    Marland Kitchen lidocaine-prilocaine (EMLA) cream Apply topically.    . nitrofurantoin, macrocrystal-monohydrate, (MACROBID) 100 MG capsule Take 1 capsule (100 mg total) by mouth 2 (two) times daily. 14 capsule 0  . Olopatadine HCl (PAZEO) 0.7 % SOLN Place 1 drop into both eyes every  morning.    Marland Kitchen omeprazole (PRILOSEC) 20 MG capsule Take 1 capsule (20 mg total) by mouth daily. 30 capsule 3  . polyethylene glycol (MIRALAX / GLYCOLAX) packet Take 17 g by mouth as needed for moderate constipation. 28 each 1  . potassium chloride SA (K-DUR,KLOR-CON) 20 MEQ tablet Take 1 tablet (20 mEq total) by mouth daily. Take 1 tablet ( 20 meq ) by mouth daily for 7 days;  Then as directed. (Patient not taking: Reported on 10/23/2015) 20 tablet 0  . sulfamethoxazole-trimethoprim (BACTRIM DS,SEPTRA DS) 800-160 MG tablet Take 1 tablet by mouth every Monday, Wednesday, and Friday. Reported on 06/13/2015 20 tablet 1   No current facility-administered medications for this visit.    Facility-Administered Medications Ordered in Other Visits  Medication Dose Route Frequency Provider Last Rate Last Dose  . sodium chloride 0.9 % injection 10 mL  10 mL Intravenous PRN Truitt Merle, MD   10 mL at 11/06/15 1320  . sodium chloride flush (NS) 0.9 % injection 10 mL  10 mL Intravenous PRN Truitt Merle, MD   10 mL at 10/09/15 1717    REVIEW OF SYSTEMS:   Constitutional: (+) intermittent fevers, no chills or abnormal night sweats Eyes: Denies blurriness of vision, double vision or watery eyes Ears, nose, mouth, throat, and face: Denies mucositis or sore throat Respiratory: Denies cough, dyspnea or wheezes Cardiovascular: Denies palpitation, chest discomfort or lower extremity swelling Gastrointestinal:  Denies nausea, heartburn or change in bowel habits Skin: Denies abnormal skin rashes Lymphatics: Denies new lymphadenopathy or easy bruising Neurological:Denies numbness, tingling or new weaknesses Behavioral/Psych: Mood is stable, no new changes  All other systems were reviewed with the patient and are negative.  PHYSICAL EXAMINATION: ECOG PERFORMANCE STATUS: 1  Vitals:   11/06/15 1359  BP: 104/61  Pulse: 76  Resp: 17  Temp: 98.3 F (36.8 C)   Filed Weights   11/06/15 1359  Weight: 125 lb 12.8 oz (57.1  kg)    GENERAL:alert, no distress and comfortable SKIN: skin color, texture, turgor are normal, no rashes or significant lesions EYES: normal, conjunctiva are pink and non-injected, sclera clear OROPHARYNX:no exudate, no erythema and lips, buccal mucosa, and tongue normal  NECK: supple, thyroid normal size, non-tender, without nodularity LYMPH:  no palpable lymphadenopathy in the cervical, axillary or inguinal LUNGS: clear to auscultation and percussion with normal breathing effort HEART: regular rate & rhythm and no murmurs and no lower extremity edema ABDOMEN:abdomen soft, non-tender and normal bowel sounds Musculoskeletal:no cyanosis of digits and no clubbing  PSYCH: alert & oriented x 3 with fluent speech NEURO: no focal motor/sensory deficits  LABORATORY DATA:  I have reviewed the data as listed CBC Latest Ref Rng & Units 11/06/2015 10/23/2015 10/09/2015  WBC 3.9 - 10.3 10e3/uL 6.1 5.8 4.6  Hemoglobin 11.6 - 15.9 g/dL 11.0(L) 11.7 10.8(L)  Hematocrit 34.8 - 46.6 % 33.8(L) 35.7 33.3(L)  Platelets 145 - 400 10e3/uL 254 215 277    CMP Latest Ref Rng & Units 10/23/2015 10/09/2015 10/09/2015  Glucose 70 - 140 mg/dl 249(H) 185(H) -  BUN 7.0 - 26.0 mg/dL 16.7 11.8 -  Creatinine 0.6 - 1.1 mg/dL 0.8 0.7 -  Sodium 136 - 145 mEq/L 139 142 -  Potassium 3.5 - 5.1 mEq/L 3.7 3.6 -  Chloride 101 - 111 mmol/L - - -  CO2 22 - 29 mEq/L 24 24 -  Calcium 8.4 - 10.4 mg/dL 9.9 9.7 -  Total Protein 6.4 - 8.3 g/dL 8.4(H) 8.0 7.2  Total Bilirubin 0.20 - 1.20 mg/dL <0.30 <0.30 -  Alkaline Phos 40 - 150 U/L 218(H) 232(H) -  AST 5 - 34 U/L 29 44(H) -  ALT 0 - 55 U/L 12 21 -     SPEP M-protein  09/15/2014: 4.2 10/08/14: 4.6 12/08/2014: 0.2 02/15/2015: not sufficient sample for test   09/11/2015: not observed 10/09/2015: not det    IgG mg/dl 11/03/2014: 3150  12/08/2014:  759 02/14/2014: 860 09/11/2015: 1347 10/09/2015: 1457  Kappa/lambda light chains levels and ration  11/03/14: 0.75, 152, 0.00 12/22/2014:  1.10, 2.60, 0.42 02/15/2015: 9.59,14.35, 0.67 09/11/2015: 2.44, 2.72, 0.90 10/09/2015: 3.09, 3.49, 0.89  24 h urine UPEP/IFE and light chain: 11/03/2014: IFE showed a monoclonal IgG heavy chain with associated lambda light chain. M protein was Undetectable    PATHOLOGY REPORT  Bone Marrow (BM) and Peripheral Blood (PB) FINAL PATHOLOGIC DIAGNOSIS  BONE MARROW: 07/11/2015 Hypocellular marrow (20%) with no increase in plasma cells (2%). See comment.  PERIPHERAL BLOOD: Mild anemia. No circulating plasma cells identified. See comment and CBC data.  COMMENT: The bone marrow core biopsy and clot section are hypocellular for age (20%, 10-40% range). There is no increase in plasma cells, which is confirmed by CD138 immunohistochemistry. Myeloid and erythroid elements are present in normal proportion. Megakaryocytes are morphologically unremarkable. There are no atypical lymphoid aggregates or granulomas.  The bone marrow aspirate smears are adequate for evaluation. Plasma cells are not increased in numbers (2%). There is no increase in blasts (<1%). Myeloid and erythroid elements are present in normal proportion with a M:E ratio of 2.5:1. There is no overt dysplasia of the myeloid, erythroid, or megakaryocytic lineages. There is no lymphocytosis.  Examination of peripheral blood reveals no circulating plasma cells or rouleaux formation. The background leukocytes are morphologically unremarkable and consist mostly of neutrophils.  Immunohistochemical controls worked appropriately.   RADIOGRAPHIC STUDIES: I have personally reviewed the radiological images as listed and agreed with the findings in the report. No results found.  ASSESSMENT & PLAN: 45 year old [place], presented with anemia and leokocytosis   1. 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 Norma Fredrickson recommendation to start maintenance chemotherapy with CyBorD every 2 weeks, for at least 4 months, then monthly.  -She is tolerating chemotherapy  well, lab results reviewed with her, adequate for treatment, we'll proceed chemotherapy today. -We'll continue monitoring her SPEP, quantitative immunoglobulin and light chains level every 4 weeks. She has had no detected M-protein, normal  IgG and light chain ratio since transplant  -continue acyclovir  -Per Dr. Norma Fredrickson recommendation, I'll start her on Zometa every 4 weeks as supportive care to decrease her risk of fracture. Benefits and side effects, especially jaw necrosis, discussed with patient, she agrees to proceed. She has been seen her dentist regularly.  2. New onset diabetes  -She was noticed to have increased blood glucose level lately, with random blood glucose above 200. She also developed diabetic symptoms. No history of diabetes in the past. -This is likely steroids induced hyperglycemia -I strongly recommend her to avoid any soft drink and sweats, and watch her carbohydrate intake, and exercise more  -She has been seen by her primary care physician and had test, but has not started treatment yet.  3. Chronic Hepatitis B  - She will follow-up GI clinic at [facility] -continue entecavir   4. Hypokalemia -Her potassium 3.7 today -She will continue potassium 20 mg daily  6. GERD -Continue omeprazole daily. We discussed steroids can worsen her acid reflux.  Plan -will proceed cyBorD today and continue every 2 weeks -I encouraged her to contact her primary care physician about her recent diabetic testing results, she will likely need antidiabetic medication -I will decrease her weekly from85m to 279m-I'll see her back in 2 weeks with chemo  and zometa infusion   All questions were answered. The patient knows to call the clinic with any problems, questions or concerns.  I spent 30 minutes counseling the patient face to face. The total time spent in the appointment was 35 minutes and more than 50% was on counseling.     FeTruitt MerleMD 11/06/2015

## 2015-11-07 LAB — KAPPA/LAMBDA LIGHT CHAINS
Ig Kappa Free Light Chain: 23 mg/L — ABNORMAL HIGH (ref 3.3–19.4)
Ig Lambda Free Light Chain: 26.2 mg/L (ref 5.7–26.3)
Kappa/Lambda FluidC Ratio: 0.88 (ref 0.26–1.65)

## 2015-11-08 LAB — MULTIPLE MYELOMA PANEL, SERUM
ALBUMIN SERPL ELPH-MCNC: 3.4 g/dL (ref 2.9–4.4)
ALPHA 1: 0.3 g/dL (ref 0.0–0.4)
ALPHA2 GLOB SERPL ELPH-MCNC: 0.9 g/dL (ref 0.4–1.0)
Albumin/Glob SerPl: 0.9 (ref 0.7–1.7)
B-GLOBULIN SERPL ELPH-MCNC: 1.1 g/dL (ref 0.7–1.3)
GAMMA GLOB SERPL ELPH-MCNC: 1.5 g/dL (ref 0.4–1.8)
GLOBULIN, TOTAL: 3.8 g/dL (ref 2.2–3.9)
IgA, Qn, Serum: 138 mg/dL (ref 87–352)
IgM, Qn, Serum: 56 mg/dL (ref 26–217)
Total Protein: 7.2 g/dL (ref 6.0–8.5)

## 2015-11-11 ENCOUNTER — Encounter: Payer: Self-pay | Admitting: Hematology

## 2015-11-13 ENCOUNTER — Telehealth: Payer: Self-pay | Admitting: *Deleted

## 2015-11-13 ENCOUNTER — Ambulatory Visit (HOSPITAL_COMMUNITY)
Admission: RE | Admit: 2015-11-13 | Discharge: 2015-11-13 | Disposition: A | Discharge status: Home / Self Care | Payer: BLUE CROSS/BLUE SHIELD | Source: Ambulatory Visit | Attending: Nurse Practitioner | Admitting: Nurse Practitioner

## 2015-11-13 ENCOUNTER — Ambulatory Visit (HOSPITAL_BASED_OUTPATIENT_CLINIC_OR_DEPARTMENT_OTHER): Payer: BLUE CROSS/BLUE SHIELD | Admitting: Nurse Practitioner

## 2015-11-13 ENCOUNTER — Other Ambulatory Visit: Payer: Self-pay | Admitting: *Deleted

## 2015-11-13 ENCOUNTER — Ambulatory Visit (HOSPITAL_BASED_OUTPATIENT_CLINIC_OR_DEPARTMENT_OTHER): Payer: BLUE CROSS/BLUE SHIELD

## 2015-11-13 VITALS — BP 116/72 | HR 111 | Temp 98.7°F | Resp 18 | Ht 63.0 in | Wt 124.7 lb

## 2015-11-13 DIAGNOSIS — Z95828 Presence of other vascular implants and grafts: Secondary | ICD-10-CM

## 2015-11-13 DIAGNOSIS — C9010 Plasma cell leukemia not having achieved remission: Secondary | ICD-10-CM

## 2015-11-13 DIAGNOSIS — E860 Dehydration: Secondary | ICD-10-CM | POA: Diagnosis not present

## 2015-11-13 DIAGNOSIS — R509 Fever, unspecified: Secondary | ICD-10-CM

## 2015-11-13 LAB — CBC WITH DIFFERENTIAL/PLATELET
BASO%: 0.7 % (ref 0.0–2.0)
Basophils Absolute: 0 10*3/uL (ref 0.0–0.1)
EOS%: 1.1 % (ref 0.0–7.0)
Eosinophils Absolute: 0 10*3/uL (ref 0.0–0.5)
HEMATOCRIT: 35.3 % (ref 34.8–46.6)
HEMOGLOBIN: 11.3 g/dL — AB (ref 11.6–15.9)
LYMPH#: 0.4 10*3/uL — AB (ref 0.9–3.3)
LYMPH%: 16.6 % (ref 14.0–49.7)
MCH: 29 pg (ref 25.1–34.0)
MCHC: 31.9 g/dL (ref 31.5–36.0)
MCV: 90.8 fL (ref 79.5–101.0)
MONO#: 0.4 10*3/uL (ref 0.1–0.9)
MONO%: 16.5 % — ABNORMAL HIGH (ref 0.0–14.0)
NEUT%: 65.1 % (ref 38.4–76.8)
NEUTROS ABS: 1.6 10*3/uL (ref 1.5–6.5)
PLATELETS: 159 10*3/uL (ref 145–400)
RBC: 3.89 10*6/uL (ref 3.70–5.45)
RDW: 15.5 % — AB (ref 11.2–14.5)
WBC: 2.4 10*3/uL — AB (ref 3.9–10.3)

## 2015-11-13 LAB — URINALYSIS, MICROSCOPIC - CHCC
Bilirubin (Urine): NEGATIVE
Glucose: NEGATIVE mg/dL
Ketones: NEGATIVE mg/dL
Nitrite: NEGATIVE
Protein: NEGATIVE mg/dL
SPECIFIC GRAVITY, URINE: 1.005 (ref 1.003–1.035)
UROBILINOGEN UR: 0.2 mg/dL (ref 0.2–1)
pH: 7 (ref 4.6–8.0)

## 2015-11-13 LAB — COMPREHENSIVE METABOLIC PANEL
ALT: 10 U/L (ref 0–55)
ANION GAP: 9 meq/L (ref 3–11)
AST: 33 U/L (ref 5–34)
Albumin: 3.5 g/dL (ref 3.5–5.0)
Alkaline Phosphatase: 172 U/L — ABNORMAL HIGH (ref 40–150)
BUN: 8.9 mg/dL (ref 7.0–26.0)
CALCIUM: 9.2 mg/dL (ref 8.4–10.4)
CO2: 27 meq/L (ref 22–29)
CREATININE: 0.7 mg/dL (ref 0.6–1.1)
Chloride: 107 mEq/L (ref 98–109)
EGFR: >90 mL/min/{1.73_m2} (ref >90)
Glucose: 91 mg/dl (ref 70–140)
Potassium: 3.6 mEq/L (ref 3.5–5.1)
Sodium: 142 mEq/L (ref 136–145)
TOTAL PROTEIN: 7.6 g/dL (ref 6.4–8.3)

## 2015-11-13 MED ORDER — HEPARIN SOD (PORK) LOCK FLUSH 100 UNIT/ML IV SOLN
500.0000 [IU] | Freq: Once | INTRAVENOUS | Status: AC | PRN
  Administered 2015-11-13: 500 [IU] via INTRAVENOUS
  Filled 2015-11-13: qty 5

## 2015-11-13 MED ORDER — LEVOFLOXACIN 500 MG PO TABS: 500.0000 mg | ORAL_TABLET | Freq: Every day | ORAL | 0 refills | Status: DC

## 2015-11-13 MED ORDER — SODIUM CHLORIDE 0.9 % IJ SOLN
10.0000 mL | INTRAMUSCULAR | Status: DC | PRN
  Administered 2015-11-13: 10 mL via INTRAVENOUS
  Filled 2015-11-13: qty 10

## 2015-11-13 MED ORDER — SODIUM CHLORIDE 0.9 % IV SOLN
Freq: Once | INTRAVENOUS | Status: AC
  Administered 2015-11-13: 14:00:00 via INTRAVENOUS

## 2015-11-13 NOTE — Patient Instructions (Signed)

## 2015-11-13 NOTE — Telephone Encounter (Signed)
Dtr., Goi called.  Her Mom is "sick".  She gives her tylenol and her fever goes down, but it comes right back up.  Her whole body aches.  She is nauseated.  She does think she is drinking plenty of fluids, although she could not tell me how much.  They do not have a thermometer, so she does not know how much fever she has.  She can not get up without help.  Discussed with Dr. Burr Medico,  They will come in to see Sheryl Lesser NP.  Goi can have her here at 12noon.

## 2015-11-14 ENCOUNTER — Encounter: Payer: Self-pay | Admitting: Nurse Practitioner

## 2015-11-14 DIAGNOSIS — E860 Dehydration: Secondary | ICD-10-CM | POA: Insufficient documentation

## 2015-11-14 DIAGNOSIS — R509 Fever, unspecified: Secondary | ICD-10-CM | POA: Insufficient documentation

## 2015-11-14 LAB — URINE CULTURE

## 2015-11-14 NOTE — Progress Notes (Signed)
SYMPTOM MANAGEMENT CLINIC    Chief Complaint: Fever  HPI:  Sheryl [name] 45 y.o. 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  Currently undergoing CyBorD chemotherapy regimen.     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HCC)   10/07/2014 Imaging    Abdominal ultrasound showed mild splenomegaly, stable perisplenic complex fluid collection unchanged since 08/27/2010.      10/10/2014 Miscellaneous    Peripheral blood chemistry and leukocytosis with total white count 78K, comprised of large plasma cells and his normocytic anemia. There is a myeloid left shift with previous surgical radium blasts. Flow cytometry showed 64% plasma cells      10/10/2014 Bone Marrow Biopsy    Markedly hypercellular marrow (95%), Atypical plasma cells comprise 57% of the cellularity. There was diminished multilineage in hematopoiesis with adequate maturation. Breasts less than 1%), no overt dysplasia of the myeloid or erythroid lineages.       10/10/2014 Initial Diagnosis    Plasma cell leukemia      10/13/2014 - 02/22/2015 Chemotherapy    CyborD (cytoxan 357m/m2 iv, bortezomib 1.5 mg/m, dexamethasone 40 mg, weekly every 28 days, bortezomib and dexamethasone was given twice weekly for 2 weeks during the first cycle)      04/04/2015 Bone Marrow Transplant    [facility] Her transplant course was complicated by sepsis from Escherichia coli bacteremia and associated colitis, she was discharged home on 04/27/2015.      05/07/2015 - 05/12/2015 Hospital Admission    [facility], [facility]. ID workup was negative, EGD showed evidence of gastritis and duodenitis, no H. pylori or CMV.      05/20/2015 - 05/24/2015 Hospital Admission    [facility]      07/11/2015 Bone Marrow Biopsy    Post transplant 100 a bone marrow  biopsy showed hypocellular marrow, 20%, no increase in plasma cells (2%) or other abnormalities.      08/22/2015 -  Chemotherapy    MaintenanceCyborD (cytoxan 3022mm2 iv, bortezomib 1.5 mg/m, dexamethasone 40 mg, every 2 weeks       Review of Systems  Constitutional: Positive for chills, fever and malaise/fatigue.  Gastrointestinal: Positive for nausea.  Musculoskeletal: Positive for myalgias.  All other systems reviewed and are negative.   Past Medical History:  Diagnosis Date  . Chills with fever    intermittently since d/c from hospital  . Dysuria-frequency syndrome    w/ pink urine  . GERD (gastroesophageal reflux disease)   . Hepatitis   . History of positive PPD    DX 2011--  CXR DONE NO EVIDENCE  . History of ureter stent   . Hydronephrosis, right   . Neuromuscular disorder (HCC)    legs numb intermittently  . Pneumonia   . Right ureteral stone   . Urosepsis 8/14   admitted to [facility]    Past Surgical History:  Procedure Laterality Date  . CYSTOSCOPY W/ URETERAL STENT PLACEMENT Right 09/25/2012   Procedure: CYSTOSCOPY WITH RETROGRADE PYELOGRAM/URETERAL STENT PLACEMENT;  Surgeon: ThAlexis FrockMD;  Location: [facility];  Service: Urology;  Laterality: Right;  . CYSTOSCOPY WITH RETROGRADE PYELOGRAM, URETEROSCOPY AND STENT PLACEMENT Right 10/15/2012   Procedure: CYSTOSCOPY WITH RETROGRADE PYELOGRAM, URETEROSCOPY AND REMOVAL STENT WITH  STENT PLACEMENT;  Surgeon: ThAlexis FrockMD;  [facility] Service[facility];  Laterality:  Right;  . ESOPHAGOGASTRODUODENOSCOPY (EGD) WITH PROPOFOL N/A 11/16/2014   Procedure: ESOPHAGOGASTRODUODENOSCOPY (EGD) WITH PROPOFOL;  Surgeon: Milus Banister, MD;  Location: [facility];  Service: Endoscopy;  Laterality: N/A;  . HOLMIUM LASER APPLICATION Right 5/32/9924   Procedure: HOLMIUM LASER APPLICATION;  Surgeon: Alexis Frock, MD;  Location: [facility];  Service: Urology;  Laterality: Right;  . LIVER  BIOPSY    . OTHER SURGICAL HISTORY Right    removal of ovarian cyst  . removal of uterine cyst     years ago  . RIGHT VATS W/ DRAINAGE PEURAL EFFUSION AND BX'S  10-30-2008    has Leukocytosis; Nausea & vomiting; Abdominal pain; Abnormal LFTs; GERD (gastroesophageal reflux disease); Hepatitis B; Bilateral leg numbness; Gastroesophageal reflux disease without esophagitis; Normocytic anemia; Constipation; GIB (gastrointestinal bleeding); Healthcare-associated pneumonia; Hepatitis; 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[place]); Sepsis ([place]); UTI (urinary tract infection); Acute pyelonephritis; Immunosuppressed status ([place]); Port catheter in place; Steroid-induced diabetes ([place]); Dehydration; and Fever on her problem list.    has No Known Allergies.    Medication List       Accurate as of 11/13/15 11:59 PM. Always use your most recent med list.          acyclovir 800 MG tablet Commonly known as:  ZOVIRAX Take 1 tablet (800 mg total) by mouth 2 (two) times daily.   clindamycin 300 MG capsule Commonly known as:  CLEOCIN Take 600 mg by mouth.   dexamethasone 4 MG tablet Commonly known as:  DECADRON On the day of chemo treatment every 2 weeks   entecavir 0.5 MG tablet Commonly known as:  BARACLUDE Take 0.5 mg by mouth daily.   levofloxacin 500 MG tablet Commonly known as:  LEVAQUIN Take 1 tablet (500 mg total) by mouth daily.   lidocaine-prilocaine cream Commonly known as:  EMLA Apply topically.   nitrofurantoin (macrocrystal-monohydrate) 100 MG capsule Commonly known as:  MACROBID Take 1 capsule (100 mg total) by mouth 2 (two) times daily.   omeprazole 20 MG capsule Commonly known as:  PRILOSEC Take 1 capsule (20 mg total) by mouth daily.   PAZEO 0.7 % Soln Generic drug:  Olopatadine HCl Place 1 drop into both eyes every morning.   polyethylene glycol packet Commonly known as:  MIRALAX / GLYCOLAX Take 17 g by mouth as needed for moderate constipation.   potassium chloride SA 20 MEQ  tablet Commonly known as:  K-DUR,KLOR-CON Take 1 tablet (20 mEq total) by mouth daily. Take 1 tablet ( 20 meq ) by mouth daily for 7 days;  Then as directed.   RESTASIS MULTIDOSE 0.05 % ophthalmic emulsion Generic drug:  cycloSPORINE Place 1 drop into both eyes 2 times daily.   sulfamethoxazole-trimethoprim 800-160 MG tablet Commonly known as:  BACTRIM DS,SEPTRA DS Take 1 tablet by mouth every Monday, Wednesday, and Friday. Reported on 06/13/2015        PHYSICAL EXAMINATION  Oncology Vitals 11/13/2015 11/13/2015  Height - -  Weight - -  Weight (lbs) - -  BMI (kg/m2) - -  Temp - 98.7  Pulse 111 84  Resp - 18  SpO2 100 100  BSA (m2) - -   BP Readings from Last 2 Encounters:  11/13/15 116/72  11/06/15 104/61    Physical Exam  Constitutional: She is oriented to person, place, and time and well-developed, well-nourished, and in no distress.  HENT:  Head: Normocephalic and atraumatic.  Mouth/Throat: Oropharynx is clear and moist.  Eyes: Conjunctivae and EOM are normal. Pupils are equal,  round, and reactive to light. Right eye exhibits no discharge. Left eye exhibits no discharge. No scleral icterus.  Neck: Normal range of motion. Neck supple. No JVD present. No tracheal deviation present. No thyromegaly present.  Cardiovascular: Normal rate, regular rhythm, normal heart sounds and intact distal pulses.   Pulmonary/Chest: Effort normal and breath sounds normal. No respiratory distress. She has no wheezes. She has no rales. She exhibits no tenderness.  Occasional dry cough noted, only.  Abdominal: Soft. Bowel sounds are normal. She exhibits no distension and no mass. There is no tenderness. There is no rebound and no guarding.  Musculoskeletal: Normal range of motion. She exhibits no edema or tenderness.  Lymphadenopathy:    She has no cervical adenopathy.  Neurological: She is alert and oriented to person, place, and time. Gait normal.  Skin: Skin is warm and dry. No rash  noted. No erythema. No pallor.  Psychiatric: Affect normal.  Nursing note and vitals reviewed.   LABORATORY DATA:. Appointment on 11/13/2015  Component Date Value Ref Range Status  . WBC 11/13/2015 2.4* 3.9 - 10.3 10e3/uL Final  . NEUT# 11/13/2015 1.6  1.5 - 6.5 10e3/uL Final  . HGB 11/13/2015 11.3* 11.6 - 15.9 g/dL Final  . HCT 11/13/2015 35.3  34.8 - 46.6 % Final  . Platelets 11/13/2015 159  145 - 400 10e3/uL Final  . MCV 11/13/2015 90.8  79.5 - 101.0 fL Final  . MCH 11/13/2015 29.0  25.1 - 34.0 pg Final  . MCHC 11/13/2015 31.9  31.5 - 36.0 g/dL Final  . RBC 11/13/2015 3.89  3.70 - 5.45 10e6/uL Final  . RDW 11/13/2015 15.5* 11.2 - 14.5 % Final  . lymph# 11/13/2015 0.4* 0.9 - 3.3 10e3/uL Final  . MONO# 11/13/2015 0.4  0.1 - 0.9 10e3/uL Final  . Eosinophils Absolute 11/13/2015 0.0  0.0 - 0.5 10e3/uL Final  . Basophils Absolute 11/13/2015 0.0  0.0 - 0.1 10e3/uL Final  . NEUT% 11/13/2015 65.1  38.4 - 76.8 % Final  . LYMPH% 11/13/2015 16.6  14.0 - 49.7 % Final  . MONO% 11/13/2015 16.5* 0.0 - 14.0 % Final  . EOS% 11/13/2015 1.1  0.0 - 7.0 % Final  . BASO% 11/13/2015 0.7  0.0 - 2.0 % Final  . Sodium 11/13/2015 142  136 - 145 mEq/L Final  . Potassium 11/13/2015 3.6  3.5 - 5.1 mEq/L Final  . Chloride 11/13/2015 107  98 - 109 mEq/L Final  . CO2 11/13/2015 27  22 - 29 mEq/L Final  . Glucose 11/13/2015 91  70 - 140 mg/dl Final  . BUN 11/13/2015 8.9  7.0 - 26.0 mg/dL Final  . Creatinine 11/13/2015 0.7  0.6 - 1.1 mg/dL Final  . Total Bilirubin 11/13/2015 <0.30  0.20 - 1.20 mg/dL Final  . Alkaline Phosphatase 11/13/2015 172* 40 - 150 U/L Final  . AST 11/13/2015 33  5 - 34 U/L Final  . ALT 11/13/2015 10  0 - 55 U/L Final  . Total Protein 11/13/2015 7.6  6.4 - 8.3 g/dL Final  . Albumin 11/13/2015 3.5  3.5 - 5.0 g/dL Final  . Calcium 11/13/2015 9.2  8.4 - 10.4 mg/dL Final  . Anion Gap 11/13/2015 9  3 - 11 mEq/L Final  . EGFR 11/13/2015 >90  >90 ml/min/1.73 m2 Final  . Glucose 11/13/2015  Negative  Negative mg/dL Final  . Bilirubin (Urine) 11/13/2015 Negative  Negative Final  . Ketones 11/13/2015 Negative  Negative mg/dL Final  . Specific Gravity, Urine 11/13/2015 1.005  1.003 -  1.035 Final  . Blood 11/13/2015 Trace  Negative Final  . pH 11/13/2015 7.0  4.6 - 8.0 Final  . Protein 11/13/2015 Negative  Negative- <30 mg/dL Final  . Urobilinogen, UR 11/13/2015 0.2  0.2 - 1 mg/dL Final  . Nitrite 11/13/2015 Negative  Negative Final  . Leukocyte Esterase 11/13/2015 Moderate  Negative Final  . RBC / HPF 11/13/2015 0-2  0 - 2 Final  . WBC, UA 11/13/2015 0-2  0 - 2 Final  . Bacteria, UA 11/13/2015 Few  Negative- Trace Final  . Epithelial Cells 11/13/2015 Occasional  Negative- Few Final  . BLOOD CULTURE, ROUTINE 11/13/2015 Preliminary report   Preliminary  . RESULT 1 11/13/2015 Comment   Preliminary  . BLOOD CULTURE, ROUTINE 11/13/2015 Preliminary report   Preliminary  . RESULT 1 11/13/2015 Comment   Preliminary    RADIOGRAPHIC STUDIES: Dg Chest 2 View  Result Date: 11/13/2015 CLINICAL DATA:  45 year old female with cough for several days. Plasma cell leukemia. Nonsmoker. Initial encounter. EXAM: CHEST  2 VIEW COMPARISON:  05/22/2015 and 12/01/2014 chest x-ray. FINDINGS: Subtle increased markings right middle lobe best appreciated on lateral view and new when compared to 12/01/2014. Question subtle infiltrate given patient's clinical symptoms. Prominent nipple shadows. Right central line tip distal superior vena cava/cavoatrial junction. Heart size top-normal. Mild scoliosis thoracic spine. IMPRESSION: Question subtle right middle lobe infiltrate. Electronically Signed   By: Genia Del M.D.   On: 11/13/2015 14:21    ASSESSMENT/PLAN:    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[place]) Patient received cycle 9, day 15 of her CyBorD chemotherapy on 11/06/2015.  She is scheduled to return on 11/20/2015 for labs, flush, visit, and her next cycle of chemotherapy.  Fever Patient reports intermittent  fever and chills for the last 4 days.  She is unable to report how high her fever went-because she doesn't have a working thermometer at home.  She also feels achy all over; with specific aching to her bilateral legs.  She occasionally feels nausea; but has had no vomiting.  She also has had no diarrhea.  She was constipated last week; but states that she had a very large bowel movement just yesterday.  She denies any UTI symptoms.  She denies any URI symptoms.  She has had an occasional dry cough only.  She denies any chest pain, chest pressure, shortness breath, or pain with inspiration.  On exam today.  Patient appears well and nontoxic.  Lungs were clear bilaterally; and patient had no wheezes.  Patient was noted have an occasional dry cough only.  Abdomen was soft and nontender.  Thousand's were positive in all 4 quads.  There was no flank pain.  All labs were essentially within normal limits today.  Urinalysis obtained today revealed trace hematuria, negative nitrates, moderate leukocyte esterase, 0-2 WBC, and a few bacteria.  Awaiting urine culture results.  Also, blood cultures 2 were obtained.  Chest x-ray obtained today revealed:  IMPRESSION: Question subtle right middle lobe infiltrate.  Of note-vital signs were completely stable today and patient was afebrile at temperature of 98.4.  Confirmed the patient's last Tylenol was yesterday.  Reviewed all findings with Dr. Burr Medico;  and she was in agreement that should cover patient for any possible UTI, or mild pneumonia/effusion with Levaquin antibiotics; pending culture results.  Patient was advised to call/return or go directly to the emergency department for any worsening symptoms whatsoever.  Note: Patient was able to understand some [place]; but patient's daughter was present for entire exam, as a  translator as well.     Dehydration Patient has had decreased oral intake and feels mildly dehydrated.  She will receive IV fluid  rehydration while at the cancer Center today.  She was encouraged to push fluids as well.   Patient stated understanding of all instructions; and was in agreement with this plan of care. The patient knows to call the clinic with any problems, questions or concerns.   Total time spent with patient was 40 minutes;  with greater than 75 percent of that time spent in face to face counseling regarding patient's symptoms,  and coordination of care and follow up.  Disclaimer:This dictation was prepared with Dragon/digital dictation along with [employer]. Any transcriptional errors that result from this process are unintentional.  Drue Second, NP 11/14/2015

## 2015-11-14 NOTE — Assessment & Plan Note (Signed)
Patient has had decreased oral intake and feels mildly dehydrated.  She will receive IV fluid rehydration while at the cancer Center today.  She was encouraged to push fluids as well.

## 2015-11-14 NOTE — Assessment & Plan Note (Signed)
Patient reports intermittent fever and chills for the last 4 days.  She is unable to report how high her fever went-because she doesn't have a working thermometer at home.  She also feels achy all over; with specific aching to her bilateral legs.  She occasionally feels nausea; but has had no vomiting.  She also has had no diarrhea.  She was constipated last week; but states that she had a very large bowel movement just yesterday.  She denies any UTI symptoms.  She denies any URI symptoms.  She has had an occasional dry cough only.  She denies any chest pain, chest pressure, shortness breath, or pain with inspiration.  On exam today.  Patient appears well and nontoxic.  Lungs were clear bilaterally; and patient had no wheezes.  Patient was noted have an occasional dry cough only.  Abdomen was soft and nontender.  Thousand's were positive in all 4 quads.  There was no flank pain.  All labs were essentially within normal limits today.  Urinalysis obtained today revealed trace hematuria, negative nitrates, moderate leukocyte esterase, 0-2 WBC, and a few bacteria.  Awaiting urine culture results.  Also, blood cultures 2 were obtained.  Chest x-ray obtained today revealed:  IMPRESSION: Question subtle right middle lobe infiltrate.  Of note-vital signs were completely stable today and patient was afebrile at temperature of 98.4.  Confirmed the patient's last Tylenol was yesterday.  Reviewed all findings with Dr. Burr Medico;  and she was in agreement that should cover patient for any possible UTI, or mild pneumonia/effusion with Levaquin antibiotics; pending culture results.  Patient was advised to call/return or go directly to the emergency department for any worsening symptoms whatsoever.  Note: Patient was able to understand some [place]; but patient's daughter was present for entire exam, as a Optometrist as well.

## 2015-11-14 NOTE — Assessment & Plan Note (Signed)
Patient received cycle 9, day 15 of her CyBorD chemotherapy on 11/06/2015.  She is scheduled to return on 11/20/2015 for labs, flush, visit, and her next cycle of chemotherapy.

## 2015-11-19 LAB — CULTURE, BLOOD (SINGLE)

## 2015-11-20 ENCOUNTER — Other Ambulatory Visit (HOSPITAL_BASED_OUTPATIENT_CLINIC_OR_DEPARTMENT_OTHER): Payer: BLUE CROSS/BLUE SHIELD

## 2015-11-20 ENCOUNTER — Ambulatory Visit (HOSPITAL_BASED_OUTPATIENT_CLINIC_OR_DEPARTMENT_OTHER): Payer: BLUE CROSS/BLUE SHIELD

## 2015-11-20 ENCOUNTER — Ambulatory Visit: Payer: BLUE CROSS/BLUE SHIELD

## 2015-11-20 ENCOUNTER — Encounter: Payer: Self-pay | Admitting: Hematology

## 2015-11-20 ENCOUNTER — Ambulatory Visit (HOSPITAL_BASED_OUTPATIENT_CLINIC_OR_DEPARTMENT_OTHER): Payer: BLUE CROSS/BLUE SHIELD | Admitting: Hematology

## 2015-11-20 VITALS — BP 103/68 | HR 79 | Temp 97.7°F | Resp 18 | Ht 63.0 in | Wt 121.1 lb

## 2015-11-20 DIAGNOSIS — C9010 Plasma cell leukemia not having achieved remission: Secondary | ICD-10-CM

## 2015-11-20 DIAGNOSIS — T380X5A Adverse effect of glucocorticoids and synthetic analogues, initial encounter: Secondary | ICD-10-CM

## 2015-11-20 DIAGNOSIS — Z95828 Presence of other vascular implants and grafts: Secondary | ICD-10-CM

## 2015-11-20 DIAGNOSIS — C9011 Plasma cell leukemia in remission: Secondary | ICD-10-CM

## 2015-11-20 DIAGNOSIS — E876 Hypokalemia: Secondary | ICD-10-CM

## 2015-11-20 DIAGNOSIS — D649 Anemia, unspecified: Secondary | ICD-10-CM

## 2015-11-20 DIAGNOSIS — E119 Type 2 diabetes mellitus without complications: Secondary | ICD-10-CM | POA: Diagnosis not present

## 2015-11-20 DIAGNOSIS — E099 Drug or chemical induced diabetes mellitus without complications: Secondary | ICD-10-CM

## 2015-11-20 DIAGNOSIS — B181 Chronic viral hepatitis B without delta-agent: Secondary | ICD-10-CM | POA: Diagnosis not present

## 2015-11-20 DIAGNOSIS — R1115 Cyclical vomiting syndrome unrelated to migraine: Secondary | ICD-10-CM

## 2015-11-20 LAB — CBC WITH DIFFERENTIAL/PLATELET
BASO%: 0.2 % (ref 0.0–2.0)
BASOS ABS: 0 10*3/uL (ref 0.0–0.1)
EOS%: 0.5 % (ref 0.0–7.0)
Eosinophils Absolute: 0 10*3/uL (ref 0.0–0.5)
HCT: 33.9 % — ABNORMAL LOW (ref 34.8–46.6)
HEMOGLOBIN: 11.2 g/dL — AB (ref 11.6–15.9)
LYMPH#: 0.6 10*3/uL — AB (ref 0.9–3.3)
LYMPH%: 15 % (ref 14.0–49.7)
MCH: 29.1 pg (ref 25.1–34.0)
MCHC: 33 g/dL (ref 31.5–36.0)
MCV: 88.1 fL (ref 79.5–101.0)
MONO#: 0.2 10*3/uL (ref 0.1–0.9)
MONO%: 4.2 % (ref 0.0–14.0)
NEUT%: 80.1 % — ABNORMAL HIGH (ref 38.4–76.8)
NEUTROS ABS: 3.2 10*3/uL (ref 1.5–6.5)
NRBC: 0 % (ref 0–0)
Platelets: 169 10*3/uL (ref 145–400)
RBC: 3.85 10*6/uL (ref 3.70–5.45)
RDW: 13.8 % (ref 11.2–14.5)
WBC: 4 10*3/uL (ref 3.9–10.3)

## 2015-11-20 LAB — COMPREHENSIVE METABOLIC PANEL
ALBUMIN: 3.4 g/dL — AB (ref 3.5–5.0)
ALK PHOS: 150 U/L (ref 40–150)
AST: 23 U/L (ref 5–34)
Anion Gap: 8 mEq/L (ref 3–11)
BUN: 14.5 mg/dL (ref 7.0–26.0)
CO2: 27 meq/L (ref 22–29)
Calcium: 9.7 mg/dL (ref 8.4–10.4)
Chloride: 106 mEq/L (ref 98–109)
Creatinine: 0.7 mg/dL (ref 0.6–1.1)
EGFR: >90 mL/min/{1.73_m2} (ref >90)
GLUCOSE: 119 mg/dL (ref 70–140)
Potassium: 3.7 mEq/L (ref 3.5–5.1)
SODIUM: 140 meq/L (ref 136–145)
TOTAL PROTEIN: 7.5 g/dL (ref 6.4–8.3)

## 2015-11-20 MED ORDER — ONDANSETRON HCL 4 MG/2ML IJ SOLN
8.0000 mg | Freq: Once | INTRAMUSCULAR | Status: DC
  Filled 2015-11-20: qty 4

## 2015-11-20 MED ORDER — ONDANSETRON HCL 8 MG PO TABS: 8.0000 mg | ORAL_TABLET | Freq: Three times a day (TID) | ORAL | 1 refills | Status: DC | PRN

## 2015-11-20 MED ORDER — SODIUM CHLORIDE 0.9 % IV SOLN: Freq: Once | INTRAVENOUS | Status: DC

## 2015-11-20 MED ORDER — SODIUM CHLORIDE 0.9 % IJ SOLN
10.0000 mL | INTRAMUSCULAR | Status: DC | PRN
  Administered 2015-11-20: 10 mL via INTRAVENOUS
  Filled 2015-11-20: qty 10

## 2015-11-20 MED ORDER — ONDANSETRON HCL 8 MG PO TABS
ORAL_TABLET | ORAL | Status: AC
  Filled 2015-11-20: qty 1

## 2015-11-20 MED ORDER — SODIUM CHLORIDE 0.9 % IV SOLN
INTRAVENOUS | Status: AC
  Administered 2015-11-20: 13:00:00 via INTRAVENOUS

## 2015-11-20 MED ORDER — HEPARIN SOD (PORK) LOCK FLUSH 100 UNIT/ML IV SOLN
500.0000 [IU] | Freq: Once | INTRAVENOUS | Status: AC | PRN
  Administered 2015-11-20: 500 [IU] via INTRAVENOUS
  Filled 2015-11-20: qty 5

## 2015-11-20 MED ORDER — ESOMEPRAZOLE MAGNESIUM 40 MG PO CPDR: 40.0000 mg | DELAYED_RELEASE_CAPSULE | Freq: Every day | ORAL | 2 refills | Status: DC

## 2015-11-20 MED ORDER — ONDANSETRON HCL 8 MG PO TABS
8.0000 mg | ORAL_TABLET | Freq: Once | ORAL | Status: AC
  Administered 2015-11-20: 8 mg via ORAL

## 2015-11-20 NOTE — Patient Instructions (Signed)

## 2015-11-20 NOTE — Patient Instructions (Signed)

## 2015-11-20 NOTE — Progress Notes (Signed)
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      10/10/2014 Miscellaneous    Peripheral blood chemistry and leukocytosis with total white count 78K, comprised of large plasma cells and his normocytic anemia. There is a myeloid left shift with previous surgical radium blasts. Flow cytometry showed 64% plasma cells      10/10/2014 Bone Marrow Biopsy    Markedly hypercellular marrow (95%), Atypical plasma cells comprise 57% of the cellularity. There was diminished multilineage in hematopoiesis with adequate maturation. Breasts less than 1%), no overt dysplasia of the myeloid or erythroid lineages.       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      05/07/2015 - 05/12/2015 Hospital Admission    [facility], [facility]. ID workup was negative, EGD showed evidence of gastritis and duodenitis, no H. pylori or CMV.        05/20/2015 - 05/24/2015 Hospital Admission    [facility]      07/11/2015 Bone Marrow Biopsy    Post transplant 100 a bone marrow biopsy showed hypocellular marrow, 20%, no increase in plasma cells (2%) or other abnormalities.      08/22/2015 -  Chemotherapy    MaintenanceCyborD (cytoxan 3044mm2 iv, bortezomib 1.5 mg/m, dexamethasone 40 mg, every 2 weeks       HISTORY OF PRESENTING ILLNESS:  Sheryl [name] [age].o. female is here because of recently diagnosed plasma cell leukemia. [facility] and is here to establish her local oncological care with [place] is accompanied to the clinic by her daughter and interpreter.  She presented to our hospital lth with worsening dyspnea, fatigue, cough, subjective fevers and chills, and was admitted on 09/14/2014. She was found to have allergy WBC 54.1K, hemoglobin 8.1, plt 310, she was treated with symptom management, and was discharged home on 09/17/2014, with a plan to follow-up with hematology. She represents to emergency room on 10/07/2014, was found to have white count of 78K, and worsening anemia with hemoglobin 6. She was seen by my partner Dr. EnJonette Evand plasma cell leukemia was suspected. [facility] Bone marrow biopsy was done, which confirmed plasma cell leukemia, and she started chemotherapy with Velcade, Cytoxan and dexamethasone. She received weekly dose twice, last dose on 10/20/2014.  She was discharged to home afterwards. She had a mediport placed during her hospitalization.  She has moderate fatigue, low appetie, she lost about 13 lbs in the past few months. She has moderate abdominal pain, 5-6/10, persistent, she does not take any pain meds.   I reviewed her medical records extensively, and discussed her case  with Dr. Jorje Guild and [place] program coordinator.  CURRENT THERAPY:  Maintenance chemotherapy cyBorD every 2 weeks, starts on 08/22/2015, will change to every 4 weeks after 4 months therapy   INTERIM HISTORY: Sheryl [name] returns for follow up and chemo. She was seen by our symptomatic clinic last week for persistent fever, and was given a course of levaquin. She was also seen by her PCP last week, and was given 14 days of triple therapy (lasoprizole, amoxicillion and clarithromycin) but she only took 3-4 days and stopped due to side effects (gastric discomfort and fatigue), she feels better overall this week, afebrile for the past 2-3 days, appetite slightly improved, but still eats little, and does not drink enough, feels fatigued.  MEDICAL HISTORY:  Past Medical History:  Diagnosis Date  . Chills with fever    intermittently since d/c from hospital  . Dysuria-frequency syndrome    w/ pink urine  . GERD (gastroesophageal reflux disease)   . Hepatitis   . History of positive PPD    DX 2011--  CXR DONE NO EVIDENCE  . History of ureter stent   . Hydronephrosis, right   . Neuromuscular disorder (HCC)    legs numb intermittently  . Pneumonia   . Right ureteral stone   . Urosepsis 8/14   admitted to [facility]    SURGICAL HISTORY: Past Surgical History:  Procedure Laterality Date  . CYSTOSCOPY W/ URETERAL STENT PLACEMENT Right 09/25/2012   Procedure: CYSTOSCOPY WITH RETROGRADE PYELOGRAM/URETERAL STENT PLACEMENT;  Surgeon: Alexis Frock, MD;  Location: [facility];  Service: Urology;  Laterality: Right;  . CYSTOSCOPY WITH RETROGRADE PYELOGRAM, URETEROSCOPY AND STENT PLACEMENT Right 10/15/2012   Procedure: CYSTOSCOPY WITH RETROGRADE PYELOGRAM, URETEROSCOPY AND REMOVAL STENT WITH  STENT PLACEMENT;  Surgeon: Alexis Frock, MD;  Location: [facility];  Service: Urology;  Laterality: Right;  . ESOPHAGOGASTRODUODENOSCOPY (EGD) WITH PROPOFOL N/A 11/16/2014   Procedure:  ESOPHAGOGASTRODUODENOSCOPY (EGD) WITH PROPOFOL;  Surgeon: Milus Banister, MD;  Location: [facility];  Service: Endoscopy;  Laterality: N/A;  . HOLMIUM LASER APPLICATION Right 4/50/3888   Procedure: HOLMIUM LASER APPLICATION;  Surgeon: Alexis Frock, MD;  Location: [facility];  Service: Urology;  Laterality: Right;  . LIVER BIOPSY    . OTHER SURGICAL HISTORY Right    removal of ovarian cyst  . removal of uterine cyst     years ago  . RIGHT VATS W/ DRAINAGE PEURAL EFFUSION AND BX'S  10-30-2008    SOCIAL HISTORY: Social History   Social History  . Marital Status: Single    Spouse Name: N/A  . Number of Children: 2  . Years of Education: N/A   Occupational History  . She used to work at a [employer] secretary    Social History Main Topics  . Smoking status: Never Smoker   . Smokeless tobacco: Not on file  . Alcohol Use: No  . Drug Use: No  . Sexual Activity: Not Currently    Birth Control/ Protection: Abstinence   Other Topics Concern  . Not on file   Social History Narrative    FAMILY HISTORY: Family History  Problem Relation Age of Onset  .  Stomach cancer Mother   . Lung disease Father   . Asthma Father     ALLERGIES:  has No Known Allergies.  MEDICATIONS:  Current Outpatient Prescriptions  Medication Sig Dispense Refill  . acyclovir (ZOVIRAX) 800 MG tablet Take 1 tablet (800 mg total) by mouth 2 (two) times daily. 60 tablet 2  . clindamycin (CLEOCIN) 300 MG capsule Take 600 mg by mouth.    . cycloSPORINE (RESTASIS MULTIDOSE) 0.05 % ophthalmic emulsion Place 1 drop into both eyes 2 times daily.    Marland Kitchen dexamethasone (DECADRON) 4 MG tablet On the day of chemo treatment every 2 weeks 40 tablet 1  . entecavir (BARACLUDE) 0.5 MG tablet Take 0.5 mg by mouth daily.    Marland Kitchen esomeprazole (NEXIUM) 40 MG capsule Take 40 mg by mouth daily at 12 noon.    Marland Kitchen levofloxacin (LEVAQUIN) 500 MG tablet Take 1 tablet (500 mg total) by mouth daily. 10 tablet 0  .  lidocaine-prilocaine (EMLA) cream Apply topically.    . nitrofurantoin, macrocrystal-monohydrate, (MACROBID) 100 MG capsule Take 1 capsule (100 mg total) by mouth 2 (two) times daily. 14 capsule 0  . Olopatadine HCl (PAZEO) 0.7 % SOLN Place 1 drop into both eyes every morning.    Marland Kitchen omeprazole (PRILOSEC) 20 MG capsule Take 1 capsule (20 mg total) by mouth daily. 30 capsule 3  . polyethylene glycol (MIRALAX / GLYCOLAX) packet Take 17 g by mouth as needed for moderate constipation. 28 each 1  . potassium chloride SA (K-DUR,KLOR-CON) 20 MEQ tablet Take 1 tablet (20 mEq total) by mouth daily. Take 1 tablet ( 20 meq ) by mouth daily for 7 days;  Then as directed. 20 tablet 0  . sulfamethoxazole-trimethoprim (BACTRIM DS,SEPTRA DS) 800-160 MG tablet Take 1 tablet by mouth every Monday, Wednesday, and Friday. Reported on 06/13/2015 20 tablet 1   No current facility-administered medications for this visit.    Facility-Administered Medications Ordered in Other Visits  Medication Dose Route Frequency Provider Last Rate Last Dose  . sodium chloride flush (NS) 0.9 % injection 10 mL  10 mL Intravenous PRN Truitt Merle, MD   10 mL at 10/09/15 1717    REVIEW OF SYSTEMS:   Constitutional: (+) intermittent fevers, no chills or abnormal night sweats Eyes: Denies blurriness of vision, double vision or watery eyes Ears, nose, mouth, throat, and face: Denies mucositis or sore throat Respiratory: Denies cough, dyspnea or wheezes Cardiovascular: Denies palpitation, chest discomfort or lower extremity swelling Gastrointestinal:  Denies nausea, heartburn or change in bowel habits Skin: Denies abnormal skin rashes Lymphatics: Denies new lymphadenopathy or easy bruising Neurological:Denies numbness, tingling or new weaknesses Behavioral/Psych: Mood is stable, no new changes  All other systems were reviewed with the patient and are negative.  PHYSICAL EXAMINATION: ECOG PERFORMANCE STATUS: 2  Vitals:   11/20/15 1115    BP: 103/68  Pulse: 79  Resp: 18  Temp: 97.7 F (36.5 C)   Filed Weights   11/20/15 1115  Weight: 121 lb 1.6 oz (54.9 kg)    GENERAL:alert, no distress and comfortable SKIN: skin color, texture, turgor are normal, no rashes or significant lesions EYES: normal, conjunctiva are pink and non-injected, sclera clear OROPHARYNX:no exudate, no erythema and lips, buccal mucosa, (+) mild oral thrush on the tongue NECK: supple, thyroid normal size, non-tender, without nodularity LYMPH:  no palpable lymphadenopathy in the cervical, axillary or inguinal LUNGS: clear to auscultation and percussion with normal breathing effort HEART: regular rate & rhythm and no murmurs and no  lower extremity edema ABDOMEN:abdomen soft, non-tender and normal bowel sounds Musculoskeletal:no cyanosis of digits and no clubbing  PSYCH: alert & oriented x 3 with fluent speech NEURO: no focal motor/sensory deficits  LABORATORY DATA:  I have reviewed the data as listed CBC Latest Ref Rng & Units 11/20/2015 11/13/2015 11/06/2015  WBC 3.9 - 10.3 10e3/uL 4.0 2.4(L) 6.1  Hemoglobin 11.6 - 15.9 g/dL 11.2(L) 11.3(L) 11.0(L)  Hematocrit 34.8 - 46.6 % 33.9(L) 35.3 33.8(L)  Platelets 145 - 400 10e3/uL 169 159 254    CMP Latest Ref Rng & Units 11/20/2015 11/13/2015 11/06/2015  Glucose 70 - 140 mg/dl 119 91 198(H)  BUN 7.0 - 26.0 mg/dL 14.5 8.9 10.3  Creatinine 0.6 - 1.1 mg/dL 0.7 0.7 0.7  Sodium 136 - 145 mEq/L 140 142 142  Potassium 3.5 - 5.1 mEq/L 3.7 3.6 3.7  Chloride 101 - 111 mmol/L - - -  CO2 22 - 29 mEq/L '27 27 24  ' Calcium 8.4 - 10.4 mg/dL 9.7 9.2 9.4  Total Protein 6.4 - 8.3 g/dL 7.5 7.6 7.6  Total Bilirubin 0.20 - 1.20 mg/dL <0.22 <0.30 <0.30  Alkaline Phos 40 - 150 U/L 150 172(H) 163(H)  AST 5 - 34 U/L 23 33 27  ALT 0-55 U/L U/L <'6 10 10     ' SPEP M-protein  09/15/2014: 4.2 10/08/14: 4.6 12/08/2014: 0.2 02/15/2015: not sufficient sample for test   09/11/2015: not observed 10/09/2015: not det   11/06/2015: not  det   IgG mg/dl 11/03/2014: 3150  12/08/2014:  759 02/14/2014: 860 09/11/2015: 1347 10/09/2015: 1457 11/06/2015: 1504  Kappa/lambda light chains levels and ration  11/03/14: 0.75, 152, 0.00 12/22/2014: 1.10, 2.60, 0.42 02/15/2015: 9.59,14.35, 0.67 09/11/2015: 2.44, 2.72, 0.90 10/09/2015: 3.09, 3.49, 0.89 11/06/2015: 2.30, 2.62, 0.88  24 h urine UPEP/IFE and light chain: 11/03/2014: IFE showed a monoclonal IgG heavy chain with associated lambda light chain. M protein was Undetectable    PATHOLOGY REPORT  Bone Marrow (BM) and Peripheral Blood (PB) FINAL PATHOLOGIC DIAGNOSIS  BONE MARROW: 07/11/2015 Hypocellular marrow (20%) with no increase in plasma cells (2%). See comment.  PERIPHERAL BLOOD: Mild anemia. No circulating plasma cells identified. See comment and CBC data.  COMMENT: The bone marrow core biopsy and clot section are hypocellular for age (20%, 10-40% range). There is no increase in plasma cells, which is confirmed by CD138 immunohistochemistry. Myeloid and erythroid elements are present in normal proportion. Megakaryocytes are morphologically unremarkable. There are no atypical lymphoid aggregates or granulomas.  The bone marrow aspirate smears are adequate for evaluation. Plasma cells are not increased in numbers (2%). There is no increase in blasts (<1%). Myeloid and erythroid elements are present in normal proportion with a M:E ratio of 2.5:1. There is no overt dysplasia of the myeloid, erythroid, or megakaryocytic lineages. There is no lymphocytosis.  Examination of peripheral blood reveals no circulating plasma cells or rouleaux formation. The background leukocytes are morphologically unremarkable and consist mostly of neutrophils.  Immunohistochemical controls worked appropriately.   RADIOGRAPHIC STUDIES: I have personally reviewed the radiological images as listed and agreed with the findings in the report. Dg Chest 2 View  Result Date:  11/13/2015 CLINICAL DATA:  45 year old female with cough for several days. Plasma cell leukemia. Nonsmoker. Initial encounter. EXAM: CHEST  2 VIEW COMPARISON:  05/22/2015 and 12/01/2014 chest x-ray. FINDINGS: Subtle increased markings right middle lobe best appreciated on lateral view and new when compared to 12/01/2014. Question subtle infiltrate given patient's clinical symptoms. Prominent nipple shadows. Right central line tip distal  superior vena cava/cavoatrial junction. Heart size top-normal. Mild scoliosis thoracic spine. IMPRESSION: Question subtle right middle lobe infiltrate. Electronically Signed   By: Genia Del M.D.   On: 11/13/2015 14:21    ASSESSMENT & PLAN: 45 year old [place], presented with anemia and leokocytosis   1. 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 Norma Fredrickson recommendation to start maintenance chemotherapy with CyBorD every 2 weeks, for at least 4 months, then monthly.  -She is tolerating chemotherapy well, lab results reviewed with her, adequate for treatment, we'll proceed chemotherapy today. -We'll continue monitoring her SPEP, quantitative immunoglobulin and light chains level every 4 weeks. She has had no detected M-protein, normal  IgG and light chain ratio since transplant  -continue acyclovir  -Per Dr. Norma Fredrickson recommendation, I'll start her on Zometa every 4 weeks as supportive care to decrease her risk of fracture. Benefits and side effects, especially jaw necrosis, discussed with patient, she agrees to proceed. She has been seen her dentist regularly. -She has not recovered well from her recent upper respiratory infection last week, I'll give her IV fluids,  and postpone her chemotherapy Week.  2. New onset diabetes  -She was noticed to have increased blood glucose level lately, with random blood glucose above 200. She also developed diabetic symptoms. No history of diabetes in the past. -This is likely steroids induced hyperglycemia -I strongly recommend her to avoid any soft drink and sweats, and watch her carbohydrate intake, and exercise more  -She has been seen by her primary care physician and had test, but has not started treatment yet. -Her random glucose has improved, 119 today  3. Chronic Hepatitis B  - She will follow-up GI clinic at [facility] -continue entecavir   4. Hypokalemia -Her potassium 3.7 today -She will continue potassium 20 mg daily  6. GERD -Continue omeprazole daily. We discussed steroids can worsen her acid reflux.  Plan -postpone chemo to next week and continue every 2 weeks -start zometa infusion next week and continue every 4 weeks  -NS 541m today -I will see her back in 3 weeks   All questions were answered. The patient knows to call the clinic with any problems, questions or concerns.  I spent 30 minutes counseling the patient face to face. We used the vedio interpreter service. The total time spent in the appointment was 35 minutes and more than 50% was on counseling.     FTruitt Merle MD 11/20/2015

## 2015-11-26 ENCOUNTER — Other Ambulatory Visit: Payer: Self-pay | Admitting: Hematology

## 2015-11-26 ENCOUNTER — Telehealth: Payer: Self-pay | Admitting: Hematology

## 2015-11-26 NOTE — Telephone Encounter (Signed)
Left message patient via Organ [id number] re appointments for 10/24 and 11/7.

## 2015-11-27 ENCOUNTER — Ambulatory Visit (HOSPITAL_BASED_OUTPATIENT_CLINIC_OR_DEPARTMENT_OTHER): Payer: BLUE CROSS/BLUE SHIELD

## 2015-11-27 ENCOUNTER — Other Ambulatory Visit (HOSPITAL_BASED_OUTPATIENT_CLINIC_OR_DEPARTMENT_OTHER): Payer: BLUE CROSS/BLUE SHIELD

## 2015-11-27 VITALS — BP 101/65 | HR 74 | Temp 98.5°F | Resp 18

## 2015-11-27 DIAGNOSIS — Z5111 Encounter for antineoplastic chemotherapy: Secondary | ICD-10-CM

## 2015-11-27 DIAGNOSIS — C9011 Plasma cell leukemia in remission: Secondary | ICD-10-CM | POA: Diagnosis not present

## 2015-11-27 DIAGNOSIS — C9010 Plasma cell leukemia not having achieved remission: Secondary | ICD-10-CM

## 2015-11-27 DIAGNOSIS — Z5112 Encounter for antineoplastic immunotherapy: Secondary | ICD-10-CM

## 2015-11-27 DIAGNOSIS — Z95828 Presence of other vascular implants and grafts: Secondary | ICD-10-CM

## 2015-11-27 LAB — COMPREHENSIVE METABOLIC PANEL
ALBUMIN: 3.2 g/dL — AB (ref 3.5–5.0)
ALK PHOS: 158 U/L — AB (ref 40–150)
ALT: 11 U/L (ref 0–55)
AST: 29 U/L (ref 5–34)
Anion Gap: 6 mEq/L (ref 3–11)
BILIRUBIN TOTAL: 0.22 mg/dL (ref 0.20–1.20)
BUN: 13.6 mg/dL (ref 7.0–26.0)
CO2: 29 meq/L (ref 22–29)
Calcium: 9.3 mg/dL (ref 8.4–10.4)
Chloride: 108 mEq/L (ref 98–109)
Creatinine: 0.7 mg/dL (ref 0.6–1.1)
GLUCOSE: 123 mg/dL (ref 70–140)
POTASSIUM: 3.6 meq/L (ref 3.5–5.1)
SODIUM: 143 meq/L (ref 136–145)
TOTAL PROTEIN: 7 g/dL (ref 6.4–8.3)

## 2015-11-27 LAB — CBC WITH DIFFERENTIAL/PLATELET
BASO%: 0.6 % (ref 0.0–2.0)
Basophils Absolute: 0 10*3/uL (ref 0.0–0.1)
EOS%: 1.1 % (ref 0.0–7.0)
Eosinophils Absolute: 0 10*3/uL (ref 0.0–0.5)
HCT: 32.5 % — ABNORMAL LOW (ref 34.8–46.6)
HEMOGLOBIN: 10.5 g/dL — AB (ref 11.6–15.9)
LYMPH%: 17.4 % (ref 14.0–49.7)
MCH: 29.2 pg (ref 25.1–34.0)
MCHC: 32.4 g/dL (ref 31.5–36.0)
MCV: 90.2 fL (ref 79.5–101.0)
MONO#: 0.6 10*3/uL (ref 0.1–0.9)
MONO%: 13.1 % (ref 0.0–14.0)
NEUT%: 67.8 % (ref 38.4–76.8)
NEUTROS ABS: 3.1 10*3/uL (ref 1.5–6.5)
Platelets: 216 10*3/uL (ref 145–400)
RBC: 3.6 10*6/uL — AB (ref 3.70–5.45)
RDW: 14.8 % — AB (ref 11.2–14.5)
WBC: 4.6 10*3/uL (ref 3.9–10.3)
lymph#: 0.8 10*3/uL — ABNORMAL LOW (ref 0.9–3.3)

## 2015-11-27 MED ORDER — SODIUM CHLORIDE 0.9 % IV SOLN
300.0000 mg/m2 | Freq: Once | INTRAVENOUS | Status: AC
  Administered 2015-11-27: 480 mg via INTRAVENOUS
  Filled 2015-11-27: qty 24

## 2015-11-27 MED ORDER — SODIUM CHLORIDE 0.9 % IV SOLN
Freq: Once | INTRAVENOUS | Status: AC
  Administered 2015-11-27: 16:00:00 via INTRAVENOUS

## 2015-11-27 MED ORDER — ONDANSETRON HCL 8 MG PO TABS
8.0000 mg | ORAL_TABLET | Freq: Once | ORAL | Status: AC
  Administered 2015-11-27: 8 mg via ORAL

## 2015-11-27 MED ORDER — ZOLEDRONIC ACID 4 MG/100ML IV SOLN
4.0000 mg | Freq: Once | INTRAVENOUS | Status: AC
  Administered 2015-11-27: 4 mg via INTRAVENOUS
  Filled 2015-11-27: qty 100

## 2015-11-27 MED ORDER — ONDANSETRON HCL 8 MG PO TABS
ORAL_TABLET | ORAL | Status: AC
  Filled 2015-11-27: qty 1

## 2015-11-27 MED ORDER — BORTEZOMIB CHEMO SQ INJECTION 3.5 MG (2.5MG/ML)
1.5000 mg/m2 | Freq: Once | INTRAMUSCULAR | Status: AC
  Administered 2015-11-27: 2.5 mg via SUBCUTANEOUS
  Filled 2015-11-27: qty 2.5

## 2015-11-27 NOTE — Patient Instructions (Signed)
[facility] Discharge Instructions for Patients Receiving Chemotherapy  Today you received the following chemotherapy agents cytoxan/velcade  To help prevent nausea and vomiting after your treatment, we encourage you to take your nausea medication as directed  If you develop nausea and vomiting that is not controlled by your nausea medication, call the clinic.   BELOW ARE SYMPTOMS THAT SHOULD BE REPORTED IMMEDIATELY:  *FEVER GREATER THAN 100.5 F  *CHILLS WITH OR WITHOUT FEVER  NAUSEA AND VOMITING THAT IS NOT CONTROLLED WITH YOUR NAUSEA MEDICATION  *UNUSUAL SHORTNESS OF BREATH  *UNUSUAL BRUISING OR BLEEDING  TENDERNESS IN MOUTH AND THROAT WITH OR WITHOUT PRESENCE OF ULCERS  *URINARY PROBLEMS  *BOWEL PROBLEMS  UNUSUAL RASH Items with * indicate a potential emergency and should be followed up as soon as possible.  Feel free to call the clinic you have any questions or concerns. The clinic phone number is [phone].

## 2015-11-28 LAB — KAPPA/LAMBDA LIGHT CHAINS
IG KAPPA FREE LIGHT CHAIN: 24.7 mg/L — AB (ref 3.3–19.4)
IG LAMBDA FREE LIGHT CHAIN: 30.4 mg/L — AB (ref 5.7–26.3)
Kappa/Lambda FluidC Ratio: 0.81 (ref 0.26–1.65)

## 2015-11-30 LAB — MULTIPLE MYELOMA PANEL, SERUM
Albumin SerPl Elph-Mcnc: 3.3 g/dL (ref 2.9–4.4)
Albumin/Glob SerPl: 1.1 (ref 0.7–1.7)
Alpha 1: 0.2 g/dL (ref 0.0–0.4)
Alpha2 Glob SerPl Elph-Mcnc: 0.9 g/dL (ref 0.4–1.0)
B-Globulin SerPl Elph-Mcnc: 0.8 g/dL (ref 0.7–1.3)
Gamma Glob SerPl Elph-Mcnc: 1.2 g/dL (ref 0.4–1.8)
Globulin, Total: 3.1 g/dL (ref 2.2–3.9)
IGA/IMMUNOGLOBULIN A, SERUM: 128 mg/dL (ref 87–352)
IGM (IMMUNOGLOBIN M), SRM: 66 mg/dL (ref 26–217)
IgG, Qn, Serum: 1196 mg/dL (ref 700–1600)
TOTAL PROTEIN: 6.4 g/dL (ref 6.0–8.5)

## 2015-12-03 ENCOUNTER — Other Ambulatory Visit: Payer: Self-pay | Admitting: *Deleted

## 2015-12-03 ENCOUNTER — Telehealth: Payer: Self-pay | Admitting: *Deleted

## 2015-12-03 ENCOUNTER — Ambulatory Visit (HOSPITAL_BASED_OUTPATIENT_CLINIC_OR_DEPARTMENT_OTHER): Payer: BLUE CROSS/BLUE SHIELD | Admitting: Oncology

## 2015-12-03 ENCOUNTER — Ambulatory Visit: Payer: BLUE CROSS/BLUE SHIELD

## 2015-12-03 ENCOUNTER — Ambulatory Visit (HOSPITAL_BASED_OUTPATIENT_CLINIC_OR_DEPARTMENT_OTHER): Payer: BLUE CROSS/BLUE SHIELD

## 2015-12-03 ENCOUNTER — Encounter: Payer: Self-pay | Admitting: Oncology

## 2015-12-03 ENCOUNTER — Other Ambulatory Visit: Payer: BLUE CROSS/BLUE SHIELD

## 2015-12-03 VITALS — BP 115/77 | HR 77 | Temp 97.9°F | Resp 18 | Ht 63.0 in | Wt 125.7 lb

## 2015-12-03 DIAGNOSIS — C9011 Plasma cell leukemia in remission: Secondary | ICD-10-CM

## 2015-12-03 DIAGNOSIS — K649 Unspecified hemorrhoids: Secondary | ICD-10-CM | POA: Diagnosis not present

## 2015-12-03 DIAGNOSIS — R51 Headache: Secondary | ICD-10-CM | POA: Diagnosis not present

## 2015-12-03 DIAGNOSIS — R519 Headache, unspecified: Secondary | ICD-10-CM | POA: Insufficient documentation

## 2015-12-03 LAB — CBC WITH DIFFERENTIAL/PLATELET
BASO%: 0.2 % (ref 0.0–2.0)
BASOS ABS: 0 10*3/uL (ref 0.0–0.1)
EOS ABS: 0 10*3/uL (ref 0.0–0.5)
EOS%: 0.7 % (ref 0.0–7.0)
HCT: 34.9 % (ref 34.8–46.6)
HGB: 11.2 g/dL — ABNORMAL LOW (ref 11.6–15.9)
LYMPH%: 14.4 % (ref 14.0–49.7)
MCH: 29.4 pg (ref 25.1–34.0)
MCHC: 32.1 g/dL (ref 31.5–36.0)
MCV: 91.6 fL (ref 79.5–101.0)
MONO#: 0.5 10*3/uL (ref 0.1–0.9)
MONO%: 10.5 % (ref 0.0–14.0)
NEUT%: 74.2 % (ref 38.4–76.8)
NEUTROS ABS: 3.2 10*3/uL (ref 1.5–6.5)
PLATELETS: 162 10*3/uL (ref 145–400)
RBC: 3.81 10*6/uL (ref 3.70–5.45)
RDW: 14 % (ref 11.2–14.5)
WBC: 4.3 10*3/uL (ref 3.9–10.3)
lymph#: 0.6 10*3/uL — ABNORMAL LOW (ref 0.9–3.3)

## 2015-12-03 LAB — COMPREHENSIVE METABOLIC PANEL
ALT: 13 U/L (ref 0–55)
AST: 34 U/L (ref 5–34)
Albumin: 3.4 g/dL — ABNORMAL LOW (ref 3.5–5.0)
Alkaline Phosphatase: 198 U/L — ABNORMAL HIGH (ref 40–150)
Anion Gap: 8 mEq/L (ref 3–11)
BUN: 15.7 mg/dL (ref 7.0–26.0)
CALCIUM: 8.9 mg/dL (ref 8.4–10.4)
CHLORIDE: 107 meq/L (ref 98–109)
CO2: 27 meq/L (ref 22–29)
CREATININE: 0.7 mg/dL (ref 0.6–1.1)
EGFR: >90 mL/min/{1.73_m2} (ref >90)
Glucose: 74 mg/dl (ref 70–140)
POTASSIUM: 4.2 meq/L (ref 3.5–5.1)
SODIUM: 142 meq/L (ref 136–145)
Total Bilirubin: 0.31 mg/dL (ref 0.20–1.20)
Total Protein: 7.7 g/dL (ref 6.4–8.3)

## 2015-12-03 LAB — URINALYSIS, MICROSCOPIC - CHCC
Bilirubin (Urine): NEGATIVE
GLUCOSE UR CHCC: NEGATIVE mg/dL
KETONES: NEGATIVE mg/dL
Nitrite: NEGATIVE
PROTEIN: NEGATIVE mg/dL
SPECIFIC GRAVITY, URINE: 1.005 (ref 1.003–1.035)
UROBILINOGEN UR: 0.2 mg/dL (ref 0.2–1)
pH: 5 (ref 4.6–8.0)

## 2015-12-03 LAB — MAGNESIUM: MAGNESIUM: 2.3 mg/dL (ref 1.5–2.5)

## 2015-12-03 MED ORDER — POLYETHYLENE GLYCOL 3350 17 GM/SCOOP PO POWD: 1.0000 | Freq: Every day | ORAL | 0 refills | Status: DC

## 2015-12-03 MED ORDER — HYDROCORTISONE 2.5 % RE CREA: 1.0000 "application " | TOPICAL_CREAM | Freq: Two times a day (BID) | RECTAL | 0 refills | Status: DC

## 2015-12-03 MED ORDER — DOCUSATE SODIUM 100 MG PO CAPS: 100.0000 mg | ORAL_CAPSULE | Freq: Two times a day (BID) | ORAL | 1 refills | Status: DC

## 2015-12-03 NOTE — Patient Instructions (Signed)
Soak in a warm tub 2-3 times per day.  Use Anusol HC cream twice a day.  Begin docusate sodium twice a day and MiraLAX daily to help soften the stool.  You may use Tylenol 650 mg up to 4 times a day for your headaches.

## 2015-12-03 NOTE — Telephone Encounter (Signed)
Received call from daughter stating that pt has had bright red blood in stools since Wed 11/28/15.   Pt had chemo Cytoxan, Velcade on Tues 10/24.   Pt did not call office since pt does not speak English well.   Dr. Burr Medico notified. Spoke with daughter again, and gave her appts for pt to see symptom management provider at  930 am lab/flush, and 10 am with Erasmo Downer, NP.

## 2015-12-03 NOTE — Progress Notes (Signed)
SYMPTOM MANAGEMENT CLINIC    Chief Complaint: Bright red blood in the stool and headache  HPI:  Sheryl [name] 45 y.o. female diagnosed with acute plasma cell leukemia which is in remission. Currently being treated with Velcade and Cytoxan. Last dose was given on 11/27/2015. The patient presents to the office today with her daughter who helps to translate. The patient's daughter called our office today reporting that the patient has been experiencing bright red blood in her stool. This is been going on for approximately 1 week. The patient has been afraid to have a bowel movement due to the blood and has only been going every other day. She notices the blood every single time she has a bowel movement. There is also blood on the toilet paper. Reports headache also for 1 week. Headache is right behind her eyes. She denies any visual disturbances or changes. Has not taken anything for her headaches. She denies fatigue and dyspnea on exertion. Denies fevers and chills. Reports generalized abdominal discomfort which moves around. She denies taking any new medications, over-the-counter medications, and herbal supplements.    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HCC)   10/07/2014 Imaging    Abdominal ultrasound showed mild splenomegaly, stable perisplenic complex fluid collection unchanged since 08/27/2010.      10/10/2014 Miscellaneous    Peripheral blood chemistry and leukocytosis with total white count 78K, comprised of large plasma cells and his normocytic anemia. There is a myeloid left shift with previous surgical radium blasts. Flow cytometry showed 64% plasma cells      10/10/2014 Bone Marrow Biopsy    Markedly hypercellular marrow (95%), Atypical plasma cells comprise 57% of the cellularity. There was diminished multilineage in hematopoiesis with adequate maturation. Breasts less than 1%), no overt dysplasia of the myeloid or erythroid lineages.       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      05/07/2015 - 05/12/2015 Hospital Admission    [facility], [facility] ID workup was negative, EGD showed evidence of gastritis and duodenitis, no H. pylori or CMV.      05/20/2015 - 05/24/2015 Hospital Admission    [facility]      07/11/2015 Bone Marrow Biopsy    Post transplant 100 a bone marrow biopsy showed hypocellular marrow, 20%, no increase in plasma cells (2%) or other abnormalities.      08/22/2015 -  Chemotherapy    MaintenanceCyborD (cytoxan 3091mm2 iv, bortezomib 1.5 mg/m, dexamethasone 40 mg, every 2 weeks       Review of Systems  Constitutional: Negative.   HENT: Negative for congestion, ear discharge, ear pain, hearing loss, nosebleeds, sore throat and tinnitus.   Eyes: Negative.   Respiratory: Negative.  Negative for stridor.   Cardiovascular: Negative.   Gastrointestinal: Positive for abdominal pain, blood in stool and constipation. Negative for diarrhea, heartburn, nausea and vomiting.  Genitourinary: Negative.   Musculoskeletal: Negative.   Skin: Negative.  Neurological: Positive for headaches. Negative for dizziness and focal weakness.  Endo/Heme/Allergies: Negative.   Psychiatric/Behavioral: Negative.     Past Medical History:  Diagnosis Date  . Chills with fever    intermittently since d/c from hospital  . Dysuria-frequency syndrome    w/ pink urine  . GERD  (gastroesophageal reflux disease)   . Hepatitis   . History of positive PPD    DX 2011--  CXR DONE NO EVIDENCE  . History of ureter stent   . Hydronephrosis, right   . Neuromuscular disorder (HCC)    legs numb intermittently  . Pneumonia   . Right ureteral stone   . Urosepsis 8/14   admitted to [facility]    Past Surgical History:  Procedure Laterality Date  . CYSTOSCOPY W/ URETERAL STENT PLACEMENT Right 09/25/2012   Procedure: CYSTOSCOPY WITH RETROGRADE PYELOGRAM/URETERAL STENT PLACEMENT;  Surgeon: Alexis Frock, MD;  Location: [facility];  Service: Urology;  Laterality: Right;  . CYSTOSCOPY WITH RETROGRADE PYELOGRAM, URETEROSCOPY AND STENT PLACEMENT Right 10/15/2012   Procedure: CYSTOSCOPY WITH RETROGRADE PYELOGRAM, URETEROSCOPY AND REMOVAL STENT WITH  STENT PLACEMENT;  Surgeon: Alexis Frock, MD;  [facility];  Service: Urology;  Laterality: Right;  . ESOPHAGOGASTRODUODENOSCOPY (EGD) WITH PROPOFOL N/A 11/16/2014   Procedure: ESOPHAGOGASTRODUODENOSCOPY (EGD) WITH PROPOFOL;  Surgeon: Milus Banister, MD;  Location: [facility];  Service: Endoscopy;  Laterality: N/A;  . HOLMIUM LASER APPLICATION Right 9/37/1696   Procedure: HOLMIUM LASER APPLICATION;  Surgeon: Alexis Frock, MD;  Location: [facility];  Service: Urology;  Laterality: Right;  . LIVER BIOPSY    . OTHER SURGICAL HISTORY Right    removal of ovarian cyst  . removal of uterine cyst     years ago  . RIGHT VATS W/ DRAINAGE PEURAL EFFUSION AND BX'S  10-30-2008    has Leukocytosis; Nausea & vomiting; Abdominal pain; Abnormal LFTs; GERD (gastroesophageal reflux disease); Hepatitis B; Bilateral leg numbness; Gastroesophageal reflux disease without esophagitis; Normocytic anemia; Constipation; GIB (gastrointestinal bleeding); Healthcare-associated pneumonia; Hepatitis; 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[place]); Sepsis ([place]); UTI (urinary tract infection); Acute pyelonephritis; Immunosuppressed status ([place]); Port  catheter in place; Steroid-induced diabetes ([place]); Dehydration; and Fever on her problem list.    has No Known Allergies.    Medication List       Accurate as of 12/03/15 10:35 AM. Always use your most recent med list.          acyclovir 800 MG tablet Commonly known as:  ZOVIRAX Take 1 tablet (800 mg total) by mouth 2 (two) times daily.   clindamycin 300 MG capsule Commonly known as:  CLEOCIN Take 600 mg by mouth.   dexamethasone 4 MG tablet Commonly known as:  DECADRON On the day of chemo treatment every 2 weeks   entecavir 0.5 MG tablet Commonly known as:  BARACLUDE Take 0.5 mg by mouth daily.   esomeprazole 40 MG capsule Commonly known as:  NEXIUM Take 1 capsule (40 mg total) by mouth daily at 12 noon.   levofloxacin 500 MG tablet Commonly known as:  LEVAQUIN Take 1 tablet (500 mg total) by mouth daily.   lidocaine-prilocaine cream Commonly known as:  EMLA Apply topically.   nitrofurantoin (macrocrystal-monohydrate) 100 MG capsule Commonly known as:  MACROBID Take 1 capsule (100 mg total) by mouth 2 (two) times daily.   omeprazole 20 MG capsule Commonly known as:  PRILOSEC Take 1 capsule (20 mg total) by mouth daily.   ondansetron 8 MG tablet Commonly known as:  ZOFRAN Take  1 tablet (8 mg total) by mouth every 8 (eight) hours as needed for nausea or vomiting.   PAZEO 0.7 % Soln Generic drug:  Olopatadine HCl Place 1 drop into both eyes every morning.   polyethylene glycol packet Commonly known as:  MIRALAX / GLYCOLAX Take 17 g by mouth as needed for moderate constipation.   potassium chloride SA 20 MEQ tablet Commonly known as:  K-DUR,KLOR-CON Take 1 tablet (20 mEq total) by mouth daily. Take 1 tablet ( 20 meq ) by mouth daily for 7 days;  Then as directed.   RESTASIS MULTIDOSE 0.05 % ophthalmic emulsion Generic drug:  cycloSPORINE Place 1 drop into both eyes 2 times daily.   sulfamethoxazole-trimethoprim 800-160 MG tablet Commonly known as:   BACTRIM DS,SEPTRA DS Take 1 tablet by mouth every Monday, Wednesday, and Friday. Reported on 06/13/2015        PHYSICAL EXAMINATION  Oncology Vitals 11/27/2015 11/20/2015  Height - 160 cm  Weight - 54.931 kg  Weight (lbs) - 121 lbs 2 oz  BMI (kg/m2) - 21.45 kg/m2  Temp 98.5 97.7  Pulse 74 79  Resp 18 18  SpO2 100 100  BSA (m2) - 1.56 m2   BP Readings from Last 2 Encounters:  11/27/15 101/65  11/20/15 103/68    Physical Exam  Constitutional: She is oriented to person, place, and time and well-developed, well-nourished, and in no distress. No distress.  HENT:  Head: Normocephalic and atraumatic.  Mouth/Throat: Oropharynx is clear and moist. No oropharyngeal exudate.  Eyes: Conjunctivae and EOM are normal. Pupils are equal, round, and reactive to light. No scleral icterus.  Neck: Normal range of motion. Neck supple. No tracheal deviation present. No thyromegaly present.  Cardiovascular: Normal rate, regular rhythm, normal heart sounds and intact distal pulses.   Pulmonary/Chest: Effort normal and breath sounds normal. No respiratory distress. She has no wheezes. She has no rales.  Abdominal: Soft. Bowel sounds are normal. She exhibits no distension. There is no tenderness.  Genitourinary:  Genitourinary Comments: Rectal exam demonstrates an external hemorrhoid. No bleeding noted.  Musculoskeletal: Normal range of motion. She exhibits no edema, tenderness or deformity.  Lymphadenopathy:    She has no cervical adenopathy.  Neurological: She is alert and oriented to person, place, and time. No cranial nerve deficit.  Skin: Skin is warm and dry. She is not diaphoretic. No erythema.  Psychiatric: Mood, memory, affect and judgment normal.    LABORATORY DATA:. Appointment on 12/03/2015  Component Date Value Ref Range Status  . WBC 12/03/2015 4.3  3.9 - 10.3 10e3/uL Final  . NEUT# 12/03/2015 3.2  1.5 - 6.5 10e3/uL Final  . HGB 12/03/2015 11.2* 11.6 - 15.9 g/dL Final  . HCT  12/03/2015 34.9  34.8 - 46.6 % Final  . Platelets 12/03/2015 162  145 - 400 10e3/uL Final  . MCV 12/03/2015 91.6  79.5 - 101.0 fL Final  . MCH 12/03/2015 29.4  25.1 - 34.0 pg Final  . MCHC 12/03/2015 32.1  31.5 - 36.0 g/dL Final  . RBC 12/03/2015 3.81  3.70 - 5.45 10e6/uL Final  . RDW 12/03/2015 14.0  11.2 - 14.5 % Final  . lymph# 12/03/2015 0.6* 0.9 - 3.3 10e3/uL Final  . MONO# 12/03/2015 0.5  0.1 - 0.9 10e3/uL Final  . Eosinophils Absolute 12/03/2015 0.0  0.0 - 0.5 10e3/uL Final  . Basophils Absolute 12/03/2015 0.0  0.0 - 0.1 10e3/uL Final  . NEUT% 12/03/2015 74.2  38.4 - 76.8 % Final  . LYMPH% 12/03/2015  14.4  14.0 - 49.7 % Final  . MONO% 12/03/2015 10.5  0.0 - 14.0 % Final  . EOS% 12/03/2015 0.7  0.0 - 7.0 % Final  . BASO% 12/03/2015 0.2  0.0 - 2.0 % Final    RADIOGRAPHIC STUDIES: No results found.  ASSESSMENT/PLAN:    No problem-specific Assessment & Plan notes found for this encounter. This is a 45 year old female with plasma cell leukemia which is currently in remission. She presents today for a one-week history of blood in her stool and headaches. Her exam is consistent with a hemorrhoid. CBC is stable. For her hemorrhoids she will begin Anusol HC twice a day and perform sitz baths 2-3 times per day. We also discussed that she should take medication to help soften her stool. I have recommended docusate sodium 100 mg twice a day along with MiraLAX 1 capful mixed in water daily.  For her headaches she may use Tylenol 650 mg up to 4 times per day as needed.  The patient will keep her follow-up next week as scheduled.  Patient stated understanding of all instructions; and was in agreement with this plan of care. The patient knows to call the clinic with any problems, questions or concerns.   Total time spent with patient was 30 minutes;  with greater than 50 percent of that time spent in face to face counseling regarding patient's symptoms,  and coordination of care and follow  up.  Mikey Bussing, NP 12/03/2015

## 2015-12-11 ENCOUNTER — Encounter: Payer: Self-pay | Admitting: Hematology

## 2015-12-11 ENCOUNTER — Ambulatory Visit: Payer: BLUE CROSS/BLUE SHIELD

## 2015-12-11 ENCOUNTER — Ambulatory Visit (HOSPITAL_BASED_OUTPATIENT_CLINIC_OR_DEPARTMENT_OTHER): Payer: BLUE CROSS/BLUE SHIELD

## 2015-12-11 ENCOUNTER — Other Ambulatory Visit (HOSPITAL_BASED_OUTPATIENT_CLINIC_OR_DEPARTMENT_OTHER): Payer: BLUE CROSS/BLUE SHIELD

## 2015-12-11 ENCOUNTER — Ambulatory Visit (HOSPITAL_BASED_OUTPATIENT_CLINIC_OR_DEPARTMENT_OTHER): Payer: BLUE CROSS/BLUE SHIELD | Admitting: Hematology

## 2015-12-11 VITALS — BP 109/80 | HR 98 | Temp 97.9°F | Resp 18 | Ht 63.0 in | Wt 128.5 lb

## 2015-12-11 DIAGNOSIS — C9010 Plasma cell leukemia not having achieved remission: Secondary | ICD-10-CM

## 2015-12-11 DIAGNOSIS — E876 Hypokalemia: Secondary | ICD-10-CM

## 2015-12-11 DIAGNOSIS — H578 Other specified disorders of eye and adnexa: Secondary | ICD-10-CM

## 2015-12-11 DIAGNOSIS — C9011 Plasma cell leukemia in remission: Secondary | ICD-10-CM | POA: Diagnosis not present

## 2015-12-11 DIAGNOSIS — Z5112 Encounter for antineoplastic immunotherapy: Secondary | ICD-10-CM | POA: Diagnosis not present

## 2015-12-11 DIAGNOSIS — E099 Drug or chemical induced diabetes mellitus without complications: Secondary | ICD-10-CM

## 2015-12-11 DIAGNOSIS — Z5111 Encounter for antineoplastic chemotherapy: Secondary | ICD-10-CM | POA: Diagnosis not present

## 2015-12-11 DIAGNOSIS — E119 Type 2 diabetes mellitus without complications: Secondary | ICD-10-CM | POA: Diagnosis not present

## 2015-12-11 DIAGNOSIS — B181 Chronic viral hepatitis B without delta-agent: Secondary | ICD-10-CM | POA: Diagnosis not present

## 2015-12-11 DIAGNOSIS — T380X5A Adverse effect of glucocorticoids and synthetic analogues, initial encounter: Secondary | ICD-10-CM

## 2015-12-11 DIAGNOSIS — K219 Gastro-esophageal reflux disease without esophagitis: Secondary | ICD-10-CM

## 2015-12-11 DIAGNOSIS — Z95828 Presence of other vascular implants and grafts: Secondary | ICD-10-CM

## 2015-12-11 DIAGNOSIS — D649 Anemia, unspecified: Secondary | ICD-10-CM

## 2015-12-11 LAB — CBC WITH DIFFERENTIAL/PLATELET
BASO%: 0.2 % (ref 0.0–2.0)
BASOS ABS: 0 10*3/uL (ref 0.0–0.1)
EOS ABS: 0 10*3/uL (ref 0.0–0.5)
EOS%: 0 % (ref 0.0–7.0)
HCT: 33.5 % — ABNORMAL LOW (ref 34.8–46.6)
HGB: 10.9 g/dL — ABNORMAL LOW (ref 11.6–15.9)
LYMPH%: 6.9 % — AB (ref 14.0–49.7)
MCH: 29.6 pg (ref 25.1–34.0)
MCHC: 32.5 g/dL (ref 31.5–36.0)
MCV: 91 fL (ref 79.5–101.0)
MONO#: 0.1 10*3/uL (ref 0.1–0.9)
MONO%: 1 % (ref 0.0–14.0)
NEUT#: 4.6 10*3/uL (ref 1.5–6.5)
NEUT%: 91.9 % — ABNORMAL HIGH (ref 38.4–76.8)
Platelets: 243 10*3/uL (ref 145–400)
RBC: 3.68 10*6/uL — AB (ref 3.70–5.45)
RDW: 15.4 % — ABNORMAL HIGH (ref 11.2–14.5)
WBC: 5 10*3/uL (ref 3.9–10.3)
lymph#: 0.3 10*3/uL — ABNORMAL LOW (ref 0.9–3.3)

## 2015-12-11 LAB — COMPREHENSIVE METABOLIC PANEL
ALK PHOS: 196 U/L — AB (ref 40–150)
ALT: 15 U/L (ref 0–55)
AST: 36 U/L — ABNORMAL HIGH (ref 5–34)
Albumin: 3.5 g/dL (ref 3.5–5.0)
Anion Gap: 10 mEq/L (ref 3–11)
BUN: 14.7 mg/dL (ref 7.0–26.0)
CO2: 24 meq/L (ref 22–29)
Calcium: 9.3 mg/dL (ref 8.4–10.4)
Chloride: 107 mEq/L (ref 98–109)
Creatinine: 0.7 mg/dL (ref 0.6–1.1)
Glucose: 176 mg/dl — ABNORMAL HIGH (ref 70–140)
POTASSIUM: 3.9 meq/L (ref 3.5–5.1)
SODIUM: 141 meq/L (ref 136–145)
Total Bilirubin: 0.24 mg/dL (ref 0.20–1.20)
Total Protein: 7.5 g/dL (ref 6.4–8.3)

## 2015-12-11 MED ORDER — OMEPRAZOLE 20 MG PO CPDR: 20.0000 mg | DELAYED_RELEASE_CAPSULE | Freq: Every day | ORAL | 3 refills | Status: DC

## 2015-12-11 MED ORDER — SODIUM CHLORIDE 0.9 % IJ SOLN
10.0000 mL | INTRAMUSCULAR | Status: AC | PRN
Start: 2015-12-11 — End: ?
  Administered 2015-12-11: 10 mL via INTRAVENOUS
  Filled 2015-12-11: qty 10

## 2015-12-11 MED ORDER — HEPARIN SOD (PORK) LOCK FLUSH 100 UNIT/ML IV SOLN
500.0000 [IU] | Freq: Once | INTRAVENOUS | Status: AC | PRN
  Administered 2015-12-11: 500 [IU] via INTRAVENOUS
  Filled 2015-12-11: qty 5

## 2015-12-11 MED ORDER — SODIUM CHLORIDE 0.9 % IV SOLN
300.0000 mg/m2 | Freq: Once | INTRAVENOUS | Status: AC
  Administered 2015-12-11: 480 mg via INTRAVENOUS
  Filled 2015-12-11: qty 24

## 2015-12-11 MED ORDER — ONDANSETRON HCL 8 MG PO TABS
ORAL_TABLET | ORAL | Status: AC
  Filled 2015-12-11: qty 1

## 2015-12-11 MED ORDER — DEXAMETHASONE 4 MG PO TABS: ORAL_TABLET | ORAL | 1 refills | Status: DC

## 2015-12-11 MED ORDER — SODIUM CHLORIDE 0.9 % IJ SOLN
10.0000 mL | INTRAMUSCULAR | Status: DC | PRN
  Administered 2015-12-11: 10 mL via INTRAVENOUS
  Filled 2015-12-11: qty 10

## 2015-12-11 MED ORDER — ONDANSETRON HCL 8 MG PO TABS
8.0000 mg | ORAL_TABLET | Freq: Once | ORAL | Status: AC
  Administered 2015-12-11: 8 mg via ORAL

## 2015-12-11 MED ORDER — ACYCLOVIR 800 MG PO TABS: 800.0000 mg | ORAL_TABLET | Freq: Two times a day (BID) | ORAL | 2 refills | Status: DC

## 2015-12-11 MED ORDER — BORTEZOMIB CHEMO SQ INJECTION 3.5 MG (2.5MG/ML)
1.5000 mg/m2 | Freq: Once | INTRAMUSCULAR | Status: AC
  Administered 2015-12-11: 2.5 mg via SUBCUTANEOUS
  Filled 2015-12-11: qty 2.5

## 2015-12-11 MED ORDER — SODIUM CHLORIDE 0.9 % IV SOLN
Freq: Once | INTRAVENOUS | Status: AC
  Administered 2015-12-11: 14:00:00 via INTRAVENOUS

## 2015-12-11 MED ORDER — LIDOCAINE-PRILOCAINE 2.5-2.5 % EX CREA: TOPICAL_CREAM | Freq: Once | CUTANEOUS | 0 refills | Status: DC

## 2015-12-11 NOTE — Progress Notes (Signed)
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      10/10/2014 Miscellaneous    Peripheral blood chemistry and leukocytosis with total white count 78K, comprised of large plasma cells and his normocytic anemia. There is a myeloid left shift with previous surgical radium blasts. Flow cytometry showed 64% plasma cells      10/10/2014 Bone Marrow Biopsy    Markedly hypercellular marrow (95%), Atypical plasma cells comprise 57% of the cellularity. There was diminished multilineage in hematopoiesis with adequate maturation. Breasts less than 1%), no overt dysplasia of the myeloid or erythroid lineages.       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      05/07/2015 - 05/12/2015 Hospital Admission    [facility], [facility] ID workup was negative, EGD showed evidence of gastritis and duodenitis, no H. pylori or CMV.      05/20/2015 - 05/24/2015 Hospital Admission    [facility]      07/11/2015 Bone Marrow Biopsy    Post transplant 100 a bone marrow biopsy showed hypocellular marrow, 20%, no increase in plasma cells (2%) or other abnormalities.      08/22/2015 -  Chemotherapy    MaintenanceCyborD (cytoxan 3018mm2 iv, bortezomib 1.5 mg/m, dexamethasone 40 mg, every 2 weeks       HISTORY OF PRESENTING ILLNESS:  Sheryl [name] [age].o. female is here because of recently diagnosed plasma cell leukemia. [facility] and is here to establish her local oncological care with [place] is accompanied to the clinic by her daughter and interpreter.  She presented to our hospital lth with worsening dyspnea, fatigue, cough, subjective fevers and chills, and was admitted on 09/14/2014. She was found to have allergy WBC 54.1K, hemoglobin 8.1, plt 310, she was treated with symptom management, and was discharged home on 09/17/2014, with a plan to follow-up with hematology. She represents to emergency room on 10/07/2014, was found to have white count of 78K, and worsening anemia with hemoglobin 6. She was seen by my partner Dr. EnJonette Evand plasma cell leukemia was suspected. [facility] Bone marrow biopsy was done, which confirmed plasma cell leukemia, and she started chemotherapy with Velcade, Cytoxan and dexamethasone. She received weekly dose twice, last dose on 10/20/2014. She was  discharged to home afterwards. She had a mediport placed during her hospitalization.  She has moderate fatigue, low appetie, she lost about 13 lbs in the past few months. She has moderate abdominal pain, 5-6/10, persistent, she does not take any pain meds.   I reviewed her medical records extensively, and discussed her case  with Dr. Jorje Guild and [place] program coordinator.  CURRENT THERAPY:  Maintenance chemotherapy cyBorD every 2 weeks, starts on 08/22/2015, will change to every 4 weeks after 4 months therapy   INTERIM HISTORY: Sheryl [name] returns for follow up and chemo. She was seen by our NP for rectal bleeding on 10/30, still has trace blood on toilette paper sometimes, but no significant bleeding episodes. She complains mild to moderate fatigue, pressure in head and around eyes, vision is fine. No fever or cough, leg pain has improved. Overall she is able to function well at home.   MEDICAL HISTORY:  Past Medical History:  Diagnosis Date  . Chills with fever    intermittently since d/c from hospital  . Dysuria-frequency syndrome    w/ pink urine  . GERD (gastroesophageal reflux disease)   . Hepatitis   . History of positive PPD    DX 2011--  CXR DONE NO EVIDENCE  . History of ureter stent   . Hydronephrosis, right   . Neuromuscular disorder (HCC)    legs numb intermittently  . Pneumonia   . Right ureteral stone   . Urosepsis 8/14   admitted to [facility]    SURGICAL HISTORY: Past Surgical History:  Procedure Laterality Date  . CYSTOSCOPY W/ URETERAL STENT PLACEMENT Right 09/25/2012   Procedure: CYSTOSCOPY WITH RETROGRADE PYELOGRAM/URETERAL STENT PLACEMENT;  Surgeon: Alexis Frock, MD;  Location: [facility];  Service: Urology;  Laterality: Right;  . CYSTOSCOPY WITH RETROGRADE PYELOGRAM, URETEROSCOPY AND STENT PLACEMENT Right 10/15/2012   Procedure: CYSTOSCOPY WITH RETROGRADE PYELOGRAM, URETEROSCOPY AND REMOVAL STENT WITH  STENT PLACEMENT;  Surgeon: Alexis Frock, MD;  Location: [facility];  Service: Urology;  Laterality: Right;  . ESOPHAGOGASTRODUODENOSCOPY (EGD) WITH PROPOFOL N/A 11/16/2014   Procedure: ESOPHAGOGASTRODUODENOSCOPY (EGD) WITH PROPOFOL;  Surgeon: Milus Banister, MD;  Location: [facility];  Service: Endoscopy;  Laterality: N/A;  . HOLMIUM LASER APPLICATION Right  04/18/1759   Procedure: HOLMIUM LASER APPLICATION;  Surgeon: Alexis Frock, MD;  Location: [facility];  Service: Urology;  Laterality: Right;  . LIVER BIOPSY    . OTHER SURGICAL HISTORY Right    removal of ovarian cyst  . removal of uterine cyst     years ago  . RIGHT VATS W/ DRAINAGE PEURAL EFFUSION AND BX'S  10-30-2008    SOCIAL HISTORY: Social History   Social History  . Marital Status: Single    Spouse Name: N/A  . Number of Children: 2  . Years of Education: N/A   Occupational History  . She used to work at a [employer] secretary    Social History Main Topics  . Smoking status: Never Smoker   . Smokeless tobacco: Not on file  . Alcohol Use: No  . Drug Use: No  . Sexual Activity: Not Currently    Birth Control/ Protection: Abstinence   Other Topics Concern  . Not on file   Social History Narrative    FAMILY HISTORY: Family History  Problem Relation Age of Onset  . Stomach cancer Mother   . Lung disease Father   . Asthma Father     ALLERGIES:  has No Known Allergies.  MEDICATIONS:  Current Outpatient Prescriptions  Medication Sig Dispense Refill  . acyclovir (ZOVIRAX) 800 MG tablet Take 1 tablet (800 mg total) by mouth 2 (two) times daily. 60 tablet 2  . clindamycin (CLEOCIN) 300 MG capsule Take 600 mg by mouth.    . cycloSPORINE (RESTASIS MULTIDOSE) 0.05 % ophthalmic emulsion Place 1 drop into both eyes 2 times daily.    Marland Kitchen dexamethasone (DECADRON) 4 MG tablet On the day of chemo treatment every 2 weeks 40 tablet 1  . docusate sodium (COLACE) 100 MG capsule Take 1 capsule (100 mg total) by mouth 2 (two) times daily. 60 capsule 1  . entecavir (BARACLUDE) 0.5 MG tablet Take 0.5 mg by mouth daily.    Marland Kitchen esomeprazole (NEXIUM) 40 MG capsule Take 1 capsule (40 mg total) by mouth daily at 12 noon. 30 capsule 2  . hydrocortisone (ANUSOL-HC) 2.5 % rectal cream Place 1 application rectally 2 (two) times daily. 30 g 0  . lidocaine-prilocaine (EMLA) cream Apply  topically.    . Olopatadine HCl (PAZEO) 0.7 % SOLN Place 1 drop into both eyes every morning.    Marland Kitchen omeprazole (PRILOSEC) 20 MG capsule Take 1 capsule (20 mg total) by mouth daily. 30 capsule 3  . ondansetron (ZOFRAN) 8 MG tablet Take 1 tablet (8 mg total) by mouth every 8 (eight) hours as needed for nausea or vomiting. 30 tablet 1  . polyethylene glycol powder (MIRALAX) powder Take 255 g by mouth daily. Take one capful mixed in water daily. 255 g 0  . potassium chloride SA (K-DUR,KLOR-CON) 20 MEQ tablet Take 1 tablet (20 mEq total) by mouth daily. Take 1 tablet ( 20 meq ) by mouth daily for 7 days;  Then as directed. 20 tablet 0  . sulfamethoxazole-trimethoprim (BACTRIM DS,SEPTRA DS) 800-160 MG tablet Take 1 tablet by mouth every Monday, Wednesday, and Friday. Reported on 06/13/2015 20 tablet 1   No current facility-administered medications for this visit.    Facility-Administered Medications Ordered in Other Visits  Medication Dose Route Frequency Provider Last Rate Last Dose  . sodium chloride flush (NS) 0.9 % injection 10 mL  10 mL Intravenous PRN Truitt Merle, MD   10 mL at 10/09/15 1717    REVIEW OF SYSTEMS:   Constitutional: (+) intermittent fevers, no chills or abnormal night sweats Eyes: Denies blurriness of vision, double vision or watery eyes Ears, nose, mouth, throat, and face: Denies mucositis or sore throat Respiratory: Denies cough, dyspnea or wheezes Cardiovascular: Denies palpitation, chest discomfort or lower extremity swelling Gastrointestinal:  Denies nausea, heartburn or change in bowel habits Skin: Denies abnormal skin rashes Lymphatics: Denies new lymphadenopathy or easy bruising Neurological:Denies numbness, tingling or new weaknesses Behavioral/Psych: Mood is stable, no new changes  All other systems were reviewed with the patient and are negative.  PHYSICAL EXAMINATION: ECOG PERFORMANCE STATUS: 2  Vitals:   12/11/15 1307  BP: 109/80  Pulse: 98  Resp: 18  Temp:  97.9 F (36.6 C)   Filed Weights   12/11/15 1307  Weight: 128 lb 8 oz (58.3 kg)    GENERAL:alert, no distress and comfortable SKIN: skin color, texture, turgor are normal, no rashes or significant lesions EYES: normal, conjunctiva are pink and non-injected, sclera clear OROPHARYNX:no exudate, no erythema and lips, buccal mucosa, (+) mild oral thrush on the tongue NECK: supple, thyroid normal size, non-tender, without nodularity LYMPH:  no palpable lymphadenopathy in the cervical, axillary or inguinal LUNGS: clear to auscultation and percussion with normal breathing effort HEART: regular rate &  rhythm and no murmurs and no lower extremity edema ABDOMEN:abdomen soft, non-tender and normal bowel sounds Musculoskeletal:no cyanosis of digits and no clubbing  PSYCH: alert & oriented x 3 with fluent speech NEURO: no focal motor/sensory deficits  LABORATORY DATA:  I have reviewed the data as listed CBC Latest Ref Rng & Units 12/11/2015 12/03/2015 11/27/2015  WBC 3.9 - 10.3 10e3/uL 5.0 4.3 4.6  Hemoglobin 11.6 - 15.9 g/dL 10.9(L) 11.2(L) 10.5(L)  Hematocrit 34.8 - 46.6 % 33.5(L) 34.9 32.5(L)  Platelets 145 - 400 10e3/uL 243 162 216    CMP Latest Ref Rng & Units 12/11/2015 12/03/2015 11/27/2015  Glucose 70 - 140 mg/dl 176(H) 74 123  BUN 7.0 - 26.0 mg/dL 14.7 15.7 13.6  Creatinine 0.6 - 1.1 mg/dL 0.7 0.7 0.7  Sodium 136 - 145 mEq/L 141 142 143  Potassium 3.5 - 5.1 mEq/L 3.9 4.2 3.6  Chloride 101 - 111 mmol/L - - -  CO2 22 - 29 mEq/L '24 27 29  ' Calcium 8.4 - 10.4 mg/dL 9.3 8.9 9.3  Total Protein 6.4 - 8.3 g/dL 7.5 7.7 7.0  Total Bilirubin 0.20 - 1.20 mg/dL 0.24 0.31 0.22  Alkaline Phos 40 - 150 U/L 196(H) 198(H) 158(H)  AST 5 - 34 U/L 36(H) 34 29  ALT 0 - 55 U/L '15 13 11     ' SPEP M-protein  09/15/2014: 4.2 10/08/14: 4.6 12/08/2014: 0.2 02/15/2015: not sufficient sample for test   09/11/2015: not observed 10/09/2015: not det   11/06/2015: not det   IgG mg/dl 11/03/2014: 3150    12/08/2014:  759 02/14/2014: 860 09/11/2015: 1347 10/09/2015: 1457 11/06/2015: 1504  Kappa/lambda light chains levels and ration  11/03/14: 0.75, 152, 0.00 12/22/2014: 1.10, 2.60, 0.42 02/15/2015: 9.59,14.35, 0.67 09/11/2015: 2.44, 2.72, 0.90 10/09/2015: 3.09, 3.49, 0.89 11/06/2015: 2.30, 2.62, 0.88  24 h urine UPEP/IFE and light chain: 11/03/2014: IFE showed a monoclonal IgG heavy chain with associated lambda light chain. M protein was Undetectable    PATHOLOGY REPORT  Bone Marrow (BM) and Peripheral Blood (PB) FINAL PATHOLOGIC DIAGNOSIS  BONE MARROW: 07/11/2015 Hypocellular marrow (20%) with no increase in plasma cells (2%). See comment.  PERIPHERAL BLOOD: Mild anemia. No circulating plasma cells identified. See comment and CBC data.  COMMENT: The bone marrow core biopsy and clot section are hypocellular for age (20%, 10-40% range). There is no increase in plasma cells, which is confirmed by CD138 immunohistochemistry. Myeloid and erythroid elements are present in normal proportion. Megakaryocytes are morphologically unremarkable. There are no atypical lymphoid aggregates or granulomas.  The bone marrow aspirate smears are adequate for evaluation. Plasma cells are not increased in numbers (2%). There is no increase in blasts (<1%). Myeloid and erythroid elements are present in normal proportion with a M:E ratio of 2.5:1. There is no overt dysplasia of the myeloid, erythroid, or megakaryocytic lineages. There is no lymphocytosis.  Examination of peripheral blood reveals no circulating plasma cells or rouleaux formation. The background leukocytes are morphologically unremarkable and consist mostly of neutrophils.  Immunohistochemical controls worked appropriately.   RADIOGRAPHIC STUDIES: I have personally reviewed the radiological images as listed and agreed with the findings in the report. Dg Chest 2 View  Result Date: 11/13/2015 CLINICAL DATA:  45 year old female with  cough for several days. Plasma cell leukemia. Nonsmoker. Initial encounter. EXAM: CHEST  2 VIEW COMPARISON:  05/22/2015 and 12/01/2014 chest x-ray. FINDINGS: Subtle increased markings right middle lobe best appreciated on lateral view and new when compared to 12/01/2014. Question subtle infiltrate given patient's clinical symptoms.  Prominent nipple shadows. Right central line tip distal superior vena cava/cavoatrial junction. Heart size top-normal. Mild scoliosis thoracic spine. IMPRESSION: Question subtle right middle lobe infiltrate. Electronically Signed   By: Genia Del M.D.   On: 11/13/2015 14:21    ASSESSMENT & PLAN: 44 year old [place], presented with anemia and leokocytosis   1. 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 Norma Fredrickson recommendation to start maintenance chemotherapy with CyBorD every 2 weeks, for at least 4 months, then monthly.  -She is tolerating chemotherapy well, lab results reviewed with her, adequate for treatment, we'll proceed chemotherapy today. -We'll continue monitoring her SPEP, quantitative immunoglobulin and light chains level every 4 weeks. She has had no detected M-protein, normal  IgG and light chain ratio since transplant  -continue acyclovir  -Continue Zometa every 4 weeks -Return to clinic in 2 weeks for chemotherapy, and will change CyBorD to every 4 weeks from next cycle, she is on dexamethasone 20 mg weekly due to her hyperglycemia. -She tolerating chemotherapy well overall, no significant neuropathy or cytopenia, mild fatigue.   2. New onset diabetes  -She was noticed to have increased blood glucose level lately, with random blood glucose above 200. She  also developed diabetic symptoms. No history of diabetes in the past. -This is likely steroids induced hyperglycemia -I strongly recommend her to avoid any soft drink and sweats, and watch her carbohydrate intake, and exercise more  -She has been seen by her primary care physician and has a follow-up appointment later this week -Her random glucose has improved overall, 176 today  3. Chronic Hepatitis B  - She will follow-up GI clinic at [facility] -continue entecavir   4. Hypokalemia -Her potassium 3.9 today -She is off KCL supplement lately   5. GERD -Continue omeprazole daily. We discussed steroids can worsen her acid reflux.  6. Had heaviness and pressure around her eyes -Possibly related to chemotherapy, versus sinus congestion -No other neurological symptoms, we'll continue monitoring.  Plan -Lab reviewed, we'll proceed chemotherapy Cytoxan and Velcade today, she took dexamethasone at home -next zometa due in in 2 weeks  -Return to clinical follow-up and chemotherapy in 2 weeks  All questions were answered. The patient knows to call the clinic with any problems, questions or concerns.  I spent 20 minutes counseling the patient face to face. We used the vedio interpreter service. The total time spent in the appointment was 25 minutes and more than 50% was on counseling.     Truitt Merle, MD 12/11/2015

## 2015-12-11 NOTE — Patient Instructions (Signed)
[facility] Discharge Instructions for Patients Receiving Chemotherapy  Today you received the following chemotherapy agents: Cytoxan and Velcade  To help prevent nausea and vomiting after your treatment, we encourage you to take your nausea medication as directed.    If you develop nausea and vomiting that is not controlled by your nausea medication, call the clinic.   BELOW ARE SYMPTOMS THAT SHOULD BE REPORTED IMMEDIATELY:  *FEVER GREATER THAN 100.5 F  *CHILLS WITH OR WITHOUT FEVER  NAUSEA AND VOMITING THAT IS NOT CONTROLLED WITH YOUR NAUSEA MEDICATION  *UNUSUAL SHORTNESS OF BREATH  *UNUSUAL BRUISING OR BLEEDING  TENDERNESS IN MOUTH AND THROAT WITH OR WITHOUT PRESENCE OF ULCERS  *URINARY PROBLEMS  *BOWEL PROBLEMS  UNUSUAL RASH Items with * indicate a potential emergency and should be followed up as soon as possible.  Feel free to call the clinic you have any questions or concerns. The clinic phone number is [phone].  Please show the CHEMO ALERT CARD at check-in to the Emergency Department and triage nurse.   

## 2015-12-14 ENCOUNTER — Telehealth: Payer: Self-pay | Admitting: Hematology

## 2015-12-14 NOTE — Telephone Encounter (Signed)
Left message for patient re lab/fu/tx 11/21. Schedule mailed.

## 2015-12-17 ENCOUNTER — Ambulatory Visit: Payer: BLUE CROSS/BLUE SHIELD

## 2015-12-17 ENCOUNTER — Other Ambulatory Visit: Payer: BLUE CROSS/BLUE SHIELD

## 2015-12-25 ENCOUNTER — Telehealth: Payer: Self-pay | Admitting: *Deleted

## 2015-12-25 ENCOUNTER — Ambulatory Visit: Payer: BLUE CROSS/BLUE SHIELD

## 2015-12-25 ENCOUNTER — Other Ambulatory Visit (HOSPITAL_BASED_OUTPATIENT_CLINIC_OR_DEPARTMENT_OTHER): Payer: BLUE CROSS/BLUE SHIELD

## 2015-12-25 ENCOUNTER — Ambulatory Visit (HOSPITAL_BASED_OUTPATIENT_CLINIC_OR_DEPARTMENT_OTHER): Payer: BLUE CROSS/BLUE SHIELD | Admitting: Hematology

## 2015-12-25 ENCOUNTER — Encounter: Payer: Self-pay | Admitting: Hematology

## 2015-12-25 ENCOUNTER — Telehealth: Payer: Self-pay | Admitting: Hematology

## 2015-12-25 VITALS — BP 98/69 | HR 107 | Temp 98.5°F | Resp 17 | Ht 63.0 in | Wt 128.9 lb

## 2015-12-25 DIAGNOSIS — B181 Chronic viral hepatitis B without delta-agent: Secondary | ICD-10-CM

## 2015-12-25 DIAGNOSIS — E099 Drug or chemical induced diabetes mellitus without complications: Secondary | ICD-10-CM

## 2015-12-25 DIAGNOSIS — C9011 Plasma cell leukemia in remission: Secondary | ICD-10-CM | POA: Diagnosis not present

## 2015-12-25 DIAGNOSIS — K219 Gastro-esophageal reflux disease without esophagitis: Secondary | ICD-10-CM

## 2015-12-25 DIAGNOSIS — R0981 Nasal congestion: Secondary | ICD-10-CM

## 2015-12-25 DIAGNOSIS — E876 Hypokalemia: Secondary | ICD-10-CM

## 2015-12-25 DIAGNOSIS — D649 Anemia, unspecified: Secondary | ICD-10-CM

## 2015-12-25 DIAGNOSIS — R05 Cough: Secondary | ICD-10-CM

## 2015-12-25 DIAGNOSIS — R070 Pain in throat: Secondary | ICD-10-CM

## 2015-12-25 DIAGNOSIS — T380X5A Adverse effect of glucocorticoids and synthetic analogues, initial encounter: Secondary | ICD-10-CM

## 2015-12-25 LAB — COMPREHENSIVE METABOLIC PANEL
ALT: 15 U/L (ref 0–55)
AST: 41 U/L — ABNORMAL HIGH (ref 5–34)
Albumin: 3.7 g/dL (ref 3.5–5.0)
Alkaline Phosphatase: 187 U/L — ABNORMAL HIGH (ref 40–150)
Anion Gap: 10 mEq/L (ref 3–11)
BILIRUBIN TOTAL: 0.33 mg/dL (ref 0.20–1.20)
BUN: 16.4 mg/dL (ref 7.0–26.0)
CHLORIDE: 105 meq/L (ref 98–109)
CO2: 25 meq/L (ref 22–29)
CREATININE: 0.7 mg/dL (ref 0.6–1.1)
Calcium: 10 mg/dL (ref 8.4–10.4)
GLUCOSE: 77 mg/dL (ref 70–140)
Potassium: 3.8 mEq/L (ref 3.5–5.1)
SODIUM: 140 meq/L (ref 136–145)
TOTAL PROTEIN: 8.3 g/dL (ref 6.4–8.3)

## 2015-12-25 LAB — CBC WITH DIFFERENTIAL/PLATELET
BASO%: 0.2 % (ref 0.0–2.0)
BASOS ABS: 0 10*3/uL (ref 0.0–0.1)
EOS%: 0.5 % (ref 0.0–7.0)
Eosinophils Absolute: 0 10*3/uL (ref 0.0–0.5)
HEMATOCRIT: 36.1 % (ref 34.8–46.6)
HGB: 11.6 g/dL (ref 11.6–15.9)
LYMPH#: 0.5 10*3/uL — AB (ref 0.9–3.3)
LYMPH%: 7.8 % — ABNORMAL LOW (ref 14.0–49.7)
MCH: 29.6 pg (ref 25.1–34.0)
MCHC: 32.1 g/dL (ref 31.5–36.0)
MCV: 92.1 fL (ref 79.5–101.0)
MONO#: 0.6 10*3/uL (ref 0.1–0.9)
MONO%: 8.8 % (ref 0.0–14.0)
NEUT#: 5.2 10*3/uL (ref 1.5–6.5)
NEUT%: 82.7 % — AB (ref 38.4–76.8)
PLATELETS: 204 10*3/uL (ref 145–400)
RBC: 3.92 10*6/uL (ref 3.70–5.45)
RDW: 14.2 % (ref 11.2–14.5)
WBC: 6.3 10*3/uL (ref 3.9–10.3)

## 2015-12-25 NOTE — Progress Notes (Signed)
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      10/10/2014 Miscellaneous    Peripheral blood chemistry and leukocytosis with total white count 78K, comprised of large plasma cells and his normocytic anemia. There is a myeloid left shift with previous surgical radium blasts. Flow cytometry showed 64% plasma cells      10/10/2014 Bone Marrow Biopsy    Markedly hypercellular marrow (95%), Atypical plasma cells comprise 57% of the cellularity. There was diminished multilineage in hematopoiesis with adequate maturation. Breasts less than 1%), no overt dysplasia of the myeloid or erythroid lineages.       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      05/07/2015 - 05/12/2015 Hospital Admission    [facility], [facility]. ID workup was negative, EGD showed evidence of gastritis and duodenitis, no H. pylori or CMV.        05/20/2015 - 05/24/2015 Hospital Admission    [facility]      07/11/2015 Bone Marrow Biopsy    Post transplant 100 a bone marrow biopsy showed hypocellular marrow, 20%, no increase in plasma cells (2%) or other abnormalities.      08/22/2015 -  Chemotherapy    MaintenanceCyborD (cytoxan 3075mm2 iv, bortezomib 1.5 mg/m, dexamethasone 40 mg, every 2 weeks       HISTORY OF PRESENTING ILLNESS:  Sheryl [name] [age].o. female is here because of recently diagnosed plasma cell leukemia. [facility] and is here to establish her local oncological care with [place] is accompanied to the clinic by her daughter and interpreter.  She presented to our hospital lth with worsening dyspnea, fatigue, cough, subjective fevers and chills, and was admitted on 09/14/2014. She was found to have allergy WBC 54.1K, hemoglobin 8.1, plt 310, she was treated with symptom management, and was discharged home on 09/17/2014, with a plan to follow-up with hematology. She represents to emergency room on 10/07/2014, was found to have white count of 78K, and worsening anemia with hemoglobin 6. She was seen by my partner Dr. EnJonette Evand plasma cell leukemia was suspected. [facility] Bone marrow biopsy was done, which confirmed plasma cell leukemia, and she started chemotherapy with Velcade, Cytoxan and dexamethasone. She received weekly dose twice, last dose on 10/20/2014.  She was discharged to home afterwards. She had a mediport placed during her hospitalization.  She has moderate fatigue, low appetie, she lost about 13 lbs in the past few months. She has moderate abdominal pain, 5-6/10, persistent, she does not take any pain meds.   I reviewed her medical records extensively, and discussed her case  with Dr. Jorje Guild and [place] program coordinator.  CURRENT THERAPY:  Maintenance chemotherapy cyBorD every 2 weeks, starts on 08/22/2015, will change to every 4 weeks from 01/01/2016  INTERIM HISTORY: Sheryl [name] returns for follow up and chemo. She had 26 ration last week, the pain has resolved, she is still on penicillin. She was doing well until 2 days ago, when she developed sore throat, dry cough, and nasal congestion. She denies any fever, productive cough, dyspnea, or other symptoms. She does have mild intermittent low back and left leg pain. She has not been eating well in the past few days, She is to have flu shot last month.  MEDICAL HISTORY:  Past Medical History:  Diagnosis Date  . Chills with fever    intermittently since d/c from hospital  . Dysuria-frequency syndrome    w/ pink urine  . GERD (gastroesophageal reflux disease)   . Hepatitis   . History of positive PPD    DX 2011--  CXR DONE NO EVIDENCE  . History of ureter stent   . Hydronephrosis, right   . Neuromuscular disorder (HCC)    legs numb intermittently  . Pneumonia   . Right ureteral stone   . Urosepsis 8/14   ad[facility]    SURGICAL HISTORY: Past Surgical History:  Procedure Laterality Date  . CYSTOSCOPY W/ URETERAL STENT PLACEMENT Right 09/25/2012   Procedure: CYSTOSCOPY WITH RETROGRADE PYELOGRAM/URETERAL STENT PLACEMENT;  Surgeon: Alexis Frock, MD;  Location: [facility];  Service: Urology;  Laterality: Right;  . CYSTOSCOPY WITH RETROGRADE PYELOGRAM, URETEROSCOPY AND STENT PLACEMENT Right 10/15/2012   Procedure: CYSTOSCOPY WITH RETROGRADE PYELOGRAM, URETEROSCOPY AND REMOVAL STENT WITH  STENT PLACEMENT;  Surgeon: Alexis Frock, MD;  Location: [facility];  Service: Urology;  Laterality: Right;  . ESOPHAGOGASTRODUODENOSCOPY (EGD) WITH PROPOFOL N/A 11/16/2014   Procedure: ESOPHAGOGASTRODUODENOSCOPY (EGD) WITH PROPOFOL;  Surgeon: Milus Banister, MD;  Location: [facility];  Service:  Endoscopy;  Laterality: N/A;  . HOLMIUM LASER APPLICATION Right 08/06/5007   Procedure: HOLMIUM LASER APPLICATION;  Surgeon: Alexis Frock, MD;  Location[facility];  Service: Urology;  Laterality: Right;  . LIVER BIOPSY    . OTHER SURGICAL HISTORY Right    removal of ovarian cyst  . removal of uterine cyst     years ago  . RIGHT VATS W/ DRAINAGE PEURAL EFFUSION AND BX'S  10-30-2008    SOCIAL HISTORY: Social History   Social History  . Marital Status: Single    Spouse Name: N/A  . Number of Children: 2  . Years of Education: N/A   Occupational History  . She used to work at a [employer] secretary    Social History Main Topics  . Smoking status: Never Smoker   . Smokeless tobacco: Not on file  . Alcohol Use: No  . Drug Use: No  . Sexual Activity: Not Currently    Birth Control/ Protection: Abstinence   Other Topics Concern  . Not on file   Social History Narrative    FAMILY HISTORY: Family History  Problem Relation Age of Onset  . Stomach cancer Mother   . Lung disease Father   .  Asthma Father     ALLERGIES:  has No Known Allergies.  MEDICATIONS:  Current Outpatient Prescriptions  Medication Sig Dispense Refill  . acyclovir (ZOVIRAX) 800 MG tablet Take 1 tablet (800 mg total) by mouth 2 (two) times daily. 60 tablet 2  . clindamycin (CLEOCIN) 300 MG capsule Take 600 mg by mouth.    . cycloSPORINE (RESTASIS MULTIDOSE) 0.05 % ophthalmic emulsion Place 1 drop into both eyes 2 times daily.    Marland Kitchen dexamethasone (DECADRON) 4 MG tablet On the day of chemo treatment every 2 weeks 40 tablet 1  . docusate sodium (COLACE) 100 MG capsule Take 1 capsule (100 mg total) by mouth 2 (two) times daily. 60 capsule 1  . entecavir (BARACLUDE) 0.5 MG tablet Take 0.5 mg by mouth daily.    Marland Kitchen esomeprazole (NEXIUM) 40 MG capsule Take 1 capsule (40 mg total) by mouth daily at 12 noon. 30 capsule 2  . hydrocortisone (ANUSOL-HC) 2.5 % rectal cream Place 1 application rectally 2 (two)  times daily. 30 g 0  . Olopatadine HCl (PAZEO) 0.7 % SOLN Place 1 drop into both eyes every morning.    Marland Kitchen omeprazole (PRILOSEC) 20 MG capsule Take 1 capsule (20 mg total) by mouth daily. 30 capsule 3  . ondansetron (ZOFRAN) 8 MG tablet Take 1 tablet (8 mg total) by mouth every 8 (eight) hours as needed for nausea or vomiting. 30 tablet 1  . polyethylene glycol powder (MIRALAX) powder Take 255 g by mouth daily. Take one capful mixed in water daily. 255 g 0  . potassium chloride SA (K-DUR,KLOR-CON) 20 MEQ tablet Take 1 tablet (20 mEq total) by mouth daily. Take 1 tablet ( 20 meq ) by mouth daily for 7 days;  Then as directed. (Patient not taking: Reported on 12/11/2015) 20 tablet 0  . sulfamethoxazole-trimethoprim (BACTRIM DS,SEPTRA DS) 800-160 MG tablet Take 1 tablet by mouth every Monday, Wednesday, and Friday. Reported on 06/13/2015 (Patient not taking: Reported on 12/11/2015) 20 tablet 1   No current facility-administered medications for this visit.    Facility-Administered Medications Ordered in Other Visits  Medication Dose Route Frequency Provider Last Rate Last Dose  . sodium chloride 0.9 % injection 10 mL  10 mL Intravenous PRN Truitt Merle, MD   10 mL at 12/11/15 1532  . sodium chloride flush (NS) 0.9 % injection 10 mL  10 mL Intravenous PRN Truitt Merle, MD   10 mL at 10/09/15 1717    REVIEW OF SYSTEMS:   Constitutional: (+) intermittent fevers, no chills or abnormal night sweats Eyes: Denies blurriness of vision, double vision or watery eyes Ears, nose, mouth, throat, and face: Denies mucositis or sore throat Respiratory: Denies cough, dyspnea or wheezes Cardiovascular: Denies palpitation, chest discomfort or lower extremity swelling Gastrointestinal:  Denies nausea, heartburn or change in bowel habits Skin: Denies abnormal skin rashes Lymphatics: Denies new lymphadenopathy or easy bruising Neurological:Denies numbness, tingling or new weaknesses Behavioral/Psych: Mood is stable, no new  changes  All other systems were reviewed with the patient and are negative.  PHYSICAL EXAMINATION: ECOG PERFORMANCE STATUS: 2  Vitals:   12/25/15 1123  BP: 98/69  Pulse: (!) 107  Resp: 17  Temp: 98.5 F (36.9 C)   Filed Weights   12/25/15 1123  Weight: 128 lb 14.4 oz (58.5 kg)    GENERAL:alert, no distress and comfortable SKIN: skin color, texture, turgor are normal, no rashes or significant lesions EYES: normal, conjunctiva are pink and non-injected, sclera clear OROPHARYNX:no exudate, no erythema and  lips, buccal mucosa, (+) mild oral thrush on the tongue NECK: supple, thyroid normal size, non-tender, without nodularity LYMPH:  no palpable lymphadenopathy in the cervical, axillary or inguinal LUNGS: clear to auscultation and percussion with normal breathing effort HEART: regular rate & rhythm and no murmurs and no lower extremity edema ABDOMEN:abdomen soft, non-tender and normal bowel sounds Musculoskeletal:no cyanosis of digits and no clubbing  PSYCH: alert & oriented x 3 with fluent speech NEURO: no focal motor/sensory deficits  LABORATORY DATA:  I have reviewed the data as listed CBC Latest Ref Rng & Units 12/25/2015 12/11/2015 12/03/2015  WBC 3.9 - 10.3 10e3/uL 6.3 5.0 4.3  Hemoglobin 11.6 - 15.9 g/dL 11.6 10.9(L) 11.2(L)  Hematocrit 34.8 - 46.6 % 36.1 33.5(L) 34.9  Platelets 145 - 400 10e3/uL 204 243 162    CMP Latest Ref Rng & Units 12/25/2015 12/11/2015 12/03/2015  Glucose 70 - 140 mg/dl 77 176(H) 74  BUN 7.0 - 26.0 mg/dL 16.4 14.7 15.7  Creatinine 0.6 - 1.1 mg/dL 0.7 0.7 0.7  Sodium 136 - 145 mEq/L 140 141 142  Potassium 3.5 - 5.1 mEq/L 3.8 3.9 4.2  Chloride 101 - 111 mmol/L - - -  CO2 22 - 29 mEq/L '25 24 27  ' Calcium 8.4 - 10.4 mg/dL 10.0 9.3 8.9  Total Protein 6.4 - 8.3 g/dL 8.3 7.5 7.7  Total Bilirubin 0.20 - 1.20 mg/dL 0.33 0.24 0.31  Alkaline Phos 40 - 150 U/L 187(H) 196(H) 198(H)  AST 5 - 34 U/L 41(H) 36(H) 34  ALT 0 - 55 U/L '15 15 13     ' SPEP  M-protein  09/15/2014: 4.2 10/08/14: 4.6 12/08/2014: 0.2 02/15/2015: not sufficient sample for test   09/11/2015: not observed 10/09/2015: not det   11/06/2015: not det   IgG mg/dl 11/03/2014: 3150  12/08/2014:  759 02/14/2014: 860 09/11/2015: 1347 10/09/2015: 1457 11/06/2015: 1504  Kappa/lambda light chains levels and ration  11/03/14: 0.75, 152, 0.00 12/22/2014: 1.10, 2.60, 0.42 02/15/2015: 9.59,14.35, 0.67 09/11/2015: 2.44, 2.72, 0.90 10/09/2015: 3.09, 3.49, 0.89 11/06/2015: 2.30, 2.62, 0.88  24 h urine UPEP/IFE and light chain: 11/03/2014: IFE showed a monoclonal IgG heavy chain with associated lambda light chain. M protein was Undetectable    PATHOLOGY REPORT  Bone Marrow (BM) and Peripheral Blood (PB) FINAL PATHOLOGIC DIAGNOSIS  BONE MARROW: 07/11/2015 Hypocellular marrow (20%) with no increase in plasma cells (2%). See comment.  PERIPHERAL BLOOD: Mild anemia. No circulating plasma cells identified. See comment and CBC data.  COMMENT: The bone marrow core biopsy and clot section are hypocellular for age (20%, 10-40% range). There is no increase in plasma cells, which is confirmed by CD138 immunohistochemistry. Myeloid and erythroid elements are present in normal proportion. Megakaryocytes are morphologically unremarkable. There are no atypical lymphoid aggregates or granulomas.  The bone marrow aspirate smears are adequate for evaluation. Plasma cells are not increased in numbers (2%). There is no increase in blasts (<1%). Myeloid and erythroid elements are present in normal proportion with a M:E ratio of 2.5:1. There is no overt dysplasia of the myeloid, erythroid, or megakaryocytic lineages. There is no lymphocytosis.  Examination of peripheral blood reveals no circulating plasma cells or rouleaux formation. The background leukocytes are morphologically unremarkable and consist mostly of neutrophils.  Immunohistochemical controls worked appropriately.   RADIOGRAPHIC  STUDIES: I have personally reviewed the radiological images as listed and agreed with the findings in the report. No results found.  ASSESSMENT & PLAN: 45 year old [place], presented with anemia and leokocytosis   1. 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 Norma Fredrickson recommendation to start maintenance chemotherapy with CyBorD every 2 weeks, for at least 4 months, then monthly.  -She is tolerating chemotherapy well, lab results reviewed with her, adequate for treatment, we'll proceed chemotherapy today. -We'll continue monitoring her SPEP, quantitative immunoglobulin and light chains level every 4 weeks. She has had no detected M-protein, normal  IgG and light chain ratio since transplant  -continue acyclovir  -Continue Zometa every 4 weeks -Due to her URI symptoms, we'll hold chemotherapy today, and postponed to next week. Her chemotherapy will be changed to every 4 weeks from next cycle.  2. URI, likely virus infection - She has had sore throat, dry cough, and nasal congestion for the past 2 days, prior signs stable, no leukocytosis, no fever, this is likely virus infection. -She is on penicillin due to her tooth extraction last week. -I encouraged her drink fluids adequately, rest well, and the use Tylenol as needed.  3. Type 2 diabetes, steroids induced  -She was noticed to have increased blood glucose level lately, with random blood glucose above 200. She also developed diabetic symptoms. No history of diabetes in the past. -This is likely steroids induced hyperglycemia -I strongly recommend her to avoid any soft drink and sweats, and watch her carbohydrate intake, and exercise more    -She has been seen by her primary care physician and has a follow-up appointment later this week -We'll continue monitoring her random blood glucose  3. Chronic Hepatitis B  - She will follow-up GI clinic [facility] -continue entecavir   4. Hypokalemia -Her potassium 3.8 today -She is off KCL supplement lately   5. GERD -Continue omeprazole daily. We discussed steroids can worsen her acid reflux.  Plan -Due to her URI symptoms, we'll hold chemotherapy today and postpone to next week, and continue every 4 weeks -next zometa due next week  -Return to clinical follow-up in 5 weeks   All questions were answered. The patient knows to call the clinic with any problems, questions or concerns.  I spent 20 minutes counseling the patient face to face. We used the vedio interpreter service. The total time spent in the appointment was 25 minutes and more than 50% was on counseling.     Truitt Merle, MD 12/25/2015

## 2015-12-25 NOTE — Telephone Encounter (Signed)
Messages sent to chemo scheduler to be added per 12/25/15 los. Message also sent to Dr Burr Medico to schedule when can she see the patient for 01/30/16 appt. Chemo was cancelled for today, peer 12/25/15 los. Appointments scheduled per 12/25/15 los. A copy of the AVS report and appointment schedule, was given to patient, per 12/25/15 los.

## 2015-12-25 NOTE — Telephone Encounter (Signed)
Per LOS I have scheduled appts and notified the scheduler 

## 2015-12-26 LAB — KAPPA/LAMBDA LIGHT CHAINS
IG LAMBDA FREE LIGHT CHAIN: 30 mg/L — AB (ref 5.7–26.3)
Ig Kappa Free Light Chain: 25.1 mg/L — ABNORMAL HIGH (ref 3.3–19.4)
KAPPA/LAMBDA FLC RATIO: 0.84 (ref 0.26–1.65)

## 2015-12-27 LAB — MULTIPLE MYELOMA PANEL, SERUM
ALBUMIN SERPL ELPH-MCNC: 3.5 g/dL (ref 2.9–4.4)
ALBUMIN/GLOB SERPL: 1 (ref 0.7–1.7)
Alpha 1: 0.3 g/dL (ref 0.0–0.4)
Alpha2 Glob SerPl Elph-Mcnc: 1 g/dL (ref 0.4–1.0)
B-Globulin SerPl Elph-Mcnc: 1.1 g/dL (ref 0.7–1.3)
GAMMA GLOB SERPL ELPH-MCNC: 1.4 g/dL (ref 0.4–1.8)
GLOBULIN, TOTAL: 3.7 g/dL (ref 2.2–3.9)
IGA/IMMUNOGLOBULIN A, SERUM: 154 mg/dL (ref 87–352)
IGM (IMMUNOGLOBIN M), SRM: 45 mg/dL (ref 26–217)
IgG, Qn, Serum: 1400 mg/dL (ref 700–1600)
Total Protein: 7.2 g/dL (ref 6.0–8.5)

## 2016-01-02 ENCOUNTER — Other Ambulatory Visit (HOSPITAL_BASED_OUTPATIENT_CLINIC_OR_DEPARTMENT_OTHER): Payer: BLUE CROSS/BLUE SHIELD

## 2016-01-02 ENCOUNTER — Ambulatory Visit (HOSPITAL_BASED_OUTPATIENT_CLINIC_OR_DEPARTMENT_OTHER): Payer: BLUE CROSS/BLUE SHIELD

## 2016-01-02 VITALS — BP 93/69 | HR 91 | Temp 98.2°F | Resp 18

## 2016-01-02 DIAGNOSIS — Z5111 Encounter for antineoplastic chemotherapy: Secondary | ICD-10-CM

## 2016-01-02 DIAGNOSIS — Z5112 Encounter for antineoplastic immunotherapy: Secondary | ICD-10-CM

## 2016-01-02 DIAGNOSIS — Z9484 Stem cells transplant status: Secondary | ICD-10-CM

## 2016-01-02 DIAGNOSIS — C9011 Plasma cell leukemia in remission: Secondary | ICD-10-CM

## 2016-01-02 DIAGNOSIS — Z95828 Presence of other vascular implants and grafts: Secondary | ICD-10-CM

## 2016-01-02 DIAGNOSIS — C9010 Plasma cell leukemia not having achieved remission: Secondary | ICD-10-CM

## 2016-01-02 LAB — COMPREHENSIVE METABOLIC PANEL WITH GFR
ALT: 8 U/L (ref 0–55)
AST: 25 U/L (ref 5–34)
Albumin: 3.2 g/dL — ABNORMAL LOW (ref 3.5–5.0)
Alkaline Phosphatase: 163 U/L — ABNORMAL HIGH (ref 40–150)
Anion Gap: 8 meq/L (ref 3–11)
BUN: 11.3 mg/dL (ref 7.0–26.0)
CO2: 26 meq/L (ref 22–29)
Calcium: 9.4 mg/dL (ref 8.4–10.4)
Chloride: 109 meq/L (ref 98–109)
Creatinine: 0.7 mg/dL (ref 0.6–1.1)
EGFR: >90 ml/min/1.73 m2
Glucose: 102 mg/dL (ref 70–140)
Potassium: 3.7 meq/L (ref 3.5–5.1)
Sodium: 143 meq/L (ref 136–145)
Total Bilirubin: 0.22 mg/dL (ref 0.20–1.20)
Total Protein: 7.1 g/dL (ref 6.4–8.3)

## 2016-01-02 LAB — CBC WITH DIFFERENTIAL/PLATELET
BASO%: 0.4 % (ref 0.0–2.0)
Basophils Absolute: 0 10e3/uL (ref 0.0–0.1)
EOS%: 0.7 % (ref 0.0–7.0)
Eosinophils Absolute: 0 10e3/uL (ref 0.0–0.5)
HCT: 33.6 % — ABNORMAL LOW (ref 34.8–46.6)
HGB: 11 g/dL — ABNORMAL LOW (ref 11.6–15.9)
LYMPH%: 13.2 % — ABNORMAL LOW (ref 14.0–49.7)
MCH: 29.8 pg (ref 25.1–34.0)
MCHC: 32.9 g/dL (ref 31.5–36.0)
MCV: 90.7 fL (ref 79.5–101.0)
MONO#: 0.6 10e3/uL (ref 0.1–0.9)
MONO%: 10.9 % (ref 0.0–14.0)
NEUT#: 4 10e3/uL (ref 1.5–6.5)
NEUT%: 74.8 % (ref 38.4–76.8)
Platelets: 239 10e3/uL (ref 145–400)
RBC: 3.7 10e6/uL (ref 3.70–5.45)
RDW: 14.3 % (ref 11.2–14.5)
WBC: 5.4 10e3/uL (ref 3.9–10.3)
lymph#: 0.7 10e3/uL — ABNORMAL LOW (ref 0.9–3.3)

## 2016-01-02 MED ORDER — ZOLEDRONIC ACID 4 MG/100ML IV SOLN
4.0000 mg | Freq: Once | INTRAVENOUS | Status: AC
  Administered 2016-01-02: 4 mg via INTRAVENOUS
  Filled 2016-01-02: qty 100

## 2016-01-02 MED ORDER — SODIUM CHLORIDE 0.9 % IJ SOLN
10.0000 mL | INTRAMUSCULAR | Status: DC | PRN
  Administered 2016-01-02: 10 mL via INTRAVENOUS
  Filled 2016-01-02: qty 10

## 2016-01-02 MED ORDER — ONDANSETRON HCL 8 MG PO TABS
ORAL_TABLET | ORAL | Status: AC
  Filled 2016-01-02: qty 1

## 2016-01-02 MED ORDER — BORTEZOMIB CHEMO SQ INJECTION 3.5 MG (2.5MG/ML)
1.5000 mg/m2 | Freq: Once | INTRAMUSCULAR | Status: AC
  Administered 2016-01-02: 2.5 mg via SUBCUTANEOUS
  Filled 2016-01-02: qty 2.5

## 2016-01-02 MED ORDER — ONDANSETRON HCL 8 MG PO TABS
8.0000 mg | ORAL_TABLET | Freq: Once | ORAL | Status: AC
  Administered 2016-01-02: 8 mg via ORAL

## 2016-01-02 MED ORDER — HEPARIN SOD (PORK) LOCK FLUSH 100 UNIT/ML IV SOLN
500.0000 [IU] | Freq: Once | INTRAVENOUS | Status: AC | PRN
  Administered 2016-01-02: 500 [IU] via INTRAVENOUS
  Filled 2016-01-02: qty 5

## 2016-01-02 MED ORDER — ALTEPLASE 2 MG IJ SOLR
2.0000 mg | Freq: Once | INTRAMUSCULAR | Status: DC | PRN
  Filled 2016-01-02: qty 2

## 2016-01-02 MED ORDER — DEXAMETHASONE 4 MG PO TABS
ORAL_TABLET | ORAL | Status: AC
  Filled 2016-01-02: qty 10

## 2016-01-02 MED ORDER — SODIUM CHLORIDE 0.9 % IV SOLN
300.0000 mg/m2 | Freq: Once | INTRAVENOUS | Status: AC
  Administered 2016-01-02: 480 mg via INTRAVENOUS
  Filled 2016-01-02: qty 24

## 2016-01-02 MED ORDER — DEXAMETHASONE 4 MG PO TABS
40.0000 mg | ORAL_TABLET | Freq: Once | ORAL | Status: AC
  Administered 2016-01-02: 40 mg via ORAL

## 2016-01-02 NOTE — Patient Instructions (Signed)
[facility] Discharge Instructions for Patients Receiving Chemotherapy  Today you received the following chemotherapy agents: Cytoxan and Velcade  To help prevent nausea and vomiting after your treatment, we encourage you to take your nausea medication as directed.    If you develop nausea and vomiting that is not controlled by your nausea medication, call the clinic.   BELOW ARE SYMPTOMS THAT SHOULD BE REPORTED IMMEDIATELY:  *FEVER GREATER THAN 100.5 F  *CHILLS WITH OR WITHOUT FEVER  NAUSEA AND VOMITING THAT IS NOT CONTROLLED WITH YOUR NAUSEA MEDICATION  *UNUSUAL SHORTNESS OF BREATH  *UNUSUAL BRUISING OR BLEEDING  TENDERNESS IN MOUTH AND THROAT WITH OR WITHOUT PRESENCE OF ULCERS  *URINARY PROBLEMS  *BOWEL PROBLEMS  UNUSUAL RASH Items with * indicate a potential emergency and should be followed up as soon as possible.  Feel free to call the clinic you have any questions or concerns. The clinic phone number is [phone].  Please show the CHEMO ALERT CARD at check-in to the Emergency Department and triage nurse.   

## 2016-01-10 ENCOUNTER — Telehealth: Payer: Self-pay | Admitting: *Deleted

## 2016-01-10 NOTE — Telephone Encounter (Signed)
Received call from Richrd Sox RN/[facility]/Dr. Norma Fredrickson with recommendations for maintenance therapy.  Dr Norma Fredrickson suggest dropping the Cytoxan & dex & give only Velcade 1.3 mg/m2 every other week.  Note to follow from Dr. Norma Fredrickson.  Message to Dr Burr Medico.

## 2016-01-11 ENCOUNTER — Telehealth: Payer: Self-pay | Admitting: Hematology

## 2016-01-11 NOTE — Telephone Encounter (Signed)
lvm using language ine interpreter [phone] to inform pt of r/s appt to 12/13 per LOS

## 2016-01-16 ENCOUNTER — Ambulatory Visit (HOSPITAL_BASED_OUTPATIENT_CLINIC_OR_DEPARTMENT_OTHER): Payer: BLUE CROSS/BLUE SHIELD

## 2016-01-16 ENCOUNTER — Encounter: Payer: Self-pay | Admitting: Hematology

## 2016-01-16 ENCOUNTER — Other Ambulatory Visit (HOSPITAL_BASED_OUTPATIENT_CLINIC_OR_DEPARTMENT_OTHER): Payer: BLUE CROSS/BLUE SHIELD

## 2016-01-16 ENCOUNTER — Ambulatory Visit (HOSPITAL_BASED_OUTPATIENT_CLINIC_OR_DEPARTMENT_OTHER): Payer: BLUE CROSS/BLUE SHIELD | Admitting: Hematology

## 2016-01-16 ENCOUNTER — Telehealth: Payer: Self-pay | Admitting: Hematology

## 2016-01-16 VITALS — BP 101/69 | HR 96 | Temp 96.7°F | Resp 18 | Ht 63.0 in | Wt 129.3 lb

## 2016-01-16 DIAGNOSIS — T380X5A Adverse effect of glucocorticoids and synthetic analogues, initial encounter: Secondary | ICD-10-CM

## 2016-01-16 DIAGNOSIS — E099 Drug or chemical induced diabetes mellitus without complications: Secondary | ICD-10-CM | POA: Diagnosis not present

## 2016-01-16 DIAGNOSIS — C9011 Plasma cell leukemia in remission: Secondary | ICD-10-CM

## 2016-01-16 DIAGNOSIS — C9010 Plasma cell leukemia not having achieved remission: Secondary | ICD-10-CM

## 2016-01-16 DIAGNOSIS — K219 Gastro-esophageal reflux disease without esophagitis: Secondary | ICD-10-CM | POA: Diagnosis not present

## 2016-01-16 DIAGNOSIS — Z5112 Encounter for antineoplastic immunotherapy: Secondary | ICD-10-CM | POA: Diagnosis not present

## 2016-01-16 DIAGNOSIS — B1920 Unspecified viral hepatitis C without hepatic coma: Secondary | ICD-10-CM

## 2016-01-16 DIAGNOSIS — B181 Chronic viral hepatitis B without delta-agent: Secondary | ICD-10-CM

## 2016-01-16 DIAGNOSIS — D649 Anemia, unspecified: Secondary | ICD-10-CM

## 2016-01-16 LAB — CBC WITH DIFFERENTIAL/PLATELET
BASO%: 0.4 % (ref 0.0–2.0)
Basophils Absolute: 0 10e3/uL (ref 0.0–0.1)
EOS%: 2.1 % (ref 0.0–7.0)
Eosinophils Absolute: 0.1 10e3/uL (ref 0.0–0.5)
HCT: 34.4 % — ABNORMAL LOW (ref 34.8–46.6)
HGB: 11 g/dL — ABNORMAL LOW (ref 11.6–15.9)
LYMPH%: 14.4 % (ref 14.0–49.7)
MCH: 29.3 pg (ref 25.1–34.0)
MCHC: 32.1 g/dL (ref 31.5–36.0)
MCV: 91.3 fL (ref 79.5–101.0)
MONO#: 0.6 10e3/uL (ref 0.1–0.9)
MONO%: 11.7 % (ref 0.0–14.0)
NEUT#: 3.5 10e3/uL (ref 1.5–6.5)
NEUT%: 71.4 % (ref 38.4–76.8)
Platelets: 278 10e3/uL (ref 145–400)
RBC: 3.76 10e6/uL (ref 3.70–5.45)
RDW: 14 % (ref 11.2–14.5)
WBC: 4.9 10e3/uL (ref 3.9–10.3)
lymph#: 0.7 10e3/uL — ABNORMAL LOW (ref 0.9–3.3)

## 2016-01-16 LAB — COMPREHENSIVE METABOLIC PANEL WITH GFR
ALT: 19 U/L (ref 0–55)
AST: 29 U/L (ref 5–34)
Albumin: 3.2 g/dL — ABNORMAL LOW (ref 3.5–5.0)
Alkaline Phosphatase: 225 U/L — ABNORMAL HIGH (ref 40–150)
Anion Gap: 9 meq/L (ref 3–11)
BUN: 15 mg/dL (ref 7.0–26.0)
CO2: 25 meq/L (ref 22–29)
Calcium: 9.8 mg/dL (ref 8.4–10.4)
Chloride: 107 meq/L (ref 98–109)
Creatinine: 0.7 mg/dL (ref 0.6–1.1)
EGFR: >90 ml/min/1.73 m2 (ref >90)
Glucose: 76 mg/dL (ref 70–140)
Potassium: 4.1 meq/L (ref 3.5–5.1)
Sodium: 141 meq/L (ref 136–145)
Total Bilirubin: 0.25 mg/dL (ref 0.20–1.20)
Total Protein: 8.1 g/dL (ref 6.4–8.3)

## 2016-01-16 MED ORDER — PROCHLORPERAZINE MALEATE 10 MG PO TABS
10.0000 mg | ORAL_TABLET | Freq: Once | ORAL | Status: AC
  Administered 2016-01-16: 10 mg via ORAL

## 2016-01-16 MED ORDER — BORTEZOMIB CHEMO SQ INJECTION 3.5 MG (2.5MG/ML)
1.3000 mg/m2 | Freq: Once | INTRAMUSCULAR | Status: AC
  Administered 2016-01-16: 2 mg via SUBCUTANEOUS
  Filled 2016-01-16: qty 2

## 2016-01-16 MED ORDER — PROCHLORPERAZINE MALEATE 10 MG PO TABS
ORAL_TABLET | ORAL | Status: AC
  Filled 2016-01-16: qty 1

## 2016-01-16 NOTE — Patient Instructions (Signed)
[facility] Discharge Instructions for Patients Receiving Chemotherapy  Today you received the following chemotherapy agents Velcade. To help prevent nausea and vomiting after your treatment, we encourage you to take your nausea medication as directed.  If you develop nausea and vomiting that is not controlled by your nausea medication, call the clinic.   BELOW ARE SYMPTOMS THAT SHOULD BE REPORTED IMMEDIATELY:  *FEVER GREATER THAN 100.5 F  *CHILLS WITH OR WITHOUT FEVER  NAUSEA AND VOMITING THAT IS NOT CONTROLLED WITH YOUR NAUSEA MEDICATION  *UNUSUAL SHORTNESS OF BREATH  *UNUSUAL BRUISING OR BLEEDING  TENDERNESS IN MOUTH AND THROAT WITH OR WITHOUT PRESENCE OF ULCERS  *URINARY PROBLEMS  *BOWEL PROBLEMS  UNUSUAL RASH Items with * indicate a potential emergency and should be followed up as soon as possible.  Feel free to call the clinic you have any questions or concerns. The clinic phone number is [phone].  Please show the CHEMO ALERT CARD at check-in to the Emergency Department and triage nurse.    

## 2016-01-16 NOTE — Telephone Encounter (Signed)
Gave patient avs report and appointments for December thru February.  °

## 2016-01-16 NOTE — Progress Notes (Signed)
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      10/10/2014 Miscellaneous    Peripheral blood chemistry and leukocytosis with total white count 78K, comprised of large plasma cells and his normocytic anemia. There is a myeloid left shift with previous surgical radium blasts. Flow cytometry showed 64% plasma cells      10/10/2014 Bone Marrow Biopsy    Markedly hypercellular marrow (95%), Atypical plasma cells comprise 57% of the cellularity. There was diminished multilineage in hematopoiesis with adequate maturation. Breasts less than 1%), no overt dysplasia of the myeloid or erythroid lineages.       10/10/2014 Initial Diagnosis    Plasma cell leukemia      10/13/2014 - 02/22/2015 Chemotherapy    CyborD (cytoxan 385m/m2 iv, bortezomib 1.5 mg/m, dexamethasone 40 mg, weekly every 28 days, bortezomib and dexamethasone was given twice weekly for 2 weeks during the first cycle)      04/04/2015 Bone Marrow Transplant    [facility] Her transplant course was complicated by sepsis from Escherichia coli bacteremia and associated colitis, she was discharged home on 04/27/2015.      05/07/2015 - 05/12/2015 Hospital Admission    [facility], [facility]. ID workup was negative, EGD showed evidence of gastritis and duodenitis, no H. pylori or CMV.        05/20/2015 - 05/24/2015 Hospital Admission    [facility]      07/11/2015 Bone Marrow Biopsy    Post transplant 100 a bone marrow biopsy showed hypocellular marrow, 20%, no increase in plasma cells (2%) or other abnormalities.      08/22/2015 - 01/02/2016 Chemotherapy    MaintenanceCyborD (cytoxan 3075mm2 iv, bortezomib 1.5 mg/m, dexamethasone 40 mg, every 2 weeks, changed to Velcade maintenance after 4 months treatment      01/16/2016 -  Chemotherapy    Maintenance Velcade 1.3 mg/m every 2 weeks       HISTORY OF PRESENTING ILLNESS:  Sheryl [name] [age].o. female is here because of recently diagnosed plasma cell leukemia. [facility] and is here to establish her local oncological care with [place] is accompanied to the clinic by her daughter and interpreter.  She presented to our hospital lth with worsening dyspnea, fatigue, cough, subjective fevers and chills, and was admitted on 09/14/2014. She was found to have allergy WBC 54.1K, hemoglobin 8.1, plt 310, she was treated with symptom management, and was discharged home on 09/17/2014, with a plan to follow-up with hematology. She represents to emergency room on 10/07/2014, was found to have white count of 78K, and worsening anemia with hemoglobin 6. She was seen by my partner Dr. EnJonette Evand plasma cell leukemia was suspected. [facility] Bone  marrow biopsy was done, which confirmed plasma cell leukemia, and she started chemotherapy with Velcade, Cytoxan and dexamethasone. She received weekly dose twice, last dose on 10/20/2014. She was discharged to home afterwards. She had a mediport placed during her hospitalization.  She has moderate fatigue, low appetie, she lost about 13 lbs in the past few months. She has  moderate abdominal pain, 5-6/10, persistent, she does not take any pain meds.   I reviewed her medical records extensively, and discussed her case with Dr. Jorje Guild and [place] program coordinator.  CURRENT THERAPY:  Maintenance Velcade 1.46m/m2 every 2 weeks, started on 01/16/2016   INTERIM HISTORY: Sheryl [name] returns for follow up and treatment. [facility] recommended change her chemotherapy to maintenance Velcade alone, every 2 weeks. She is here to start today. She tolerated last cycle chemotherapy well, still has mild fatigue, occasional dry cough, she also has some right lower rib pain when she coughs. [facility] and developed some cold symptoms and fever 2 days ago, resolved now. She states her appetite is moderate to low, that she eats adequately. Her weight is stable. She is able to function well at home.  MEDICAL HISTORY:  Past Medical History:  Diagnosis Date  . Chills with fever    intermittently since d/c from hospital  . Dysuria-frequency syndrome    w/ pink urine  . GERD (gastroesophageal reflux disease)   . Hepatitis   . History of positive PPD    DX 2011--  CXR DONE NO EVIDENCE  . History of ureter stent   . Hydronephrosis, right   . Neuromuscular disorder (HCC)    legs numb intermittently  . Pneumonia   . Right ureteral stone   . Urosepsis 8/14   ad[facility]    SURGICAL HISTORY: Past Surgical History:  Procedure Laterality Date  . CYSTOSCOPY W/ URETERAL STENT PLACEMENT Right 09/25/2012   Procedure: CYSTOSCOPY WITH RETROGRADE PYELOGRAM/URETERAL STENT PLACEMENT;  Surgeon: TAlexis Frock MD;  Location: [facility];  Service: Urology;  Laterality: Right;  . CYSTOSCOPY WITH RETROGRADE PYELOGRAM, URETEROSCOPY AND STENT PLACEMENT Right 10/15/2012   Procedure: CYSTOSCOPY WITH RETROGRADE PYELOGRAM, URETEROSCOPY AND REMOVAL STENT WITH  STENT PLACEMENT;  Surgeon: TAlexis Frock MD;  [facility]  Service: Urology;  Laterality: Right;  . ESOPHAGOGASTRODUODENOSCOPY (EGD) WITH PROPOFOL N/A 11/16/2014   Procedure: ESOPHAGOGASTRODUODENOSCOPY (EGD) WITH PROPOFOL;  Surgeon: DMilus Banister MD;  Location: [facility];  Service: Endoscopy;  Laterality: N/A;  . HOLMIUM LASER APPLICATION Right 99/48/0165  Procedure: HOLMIUM LASER APPLICATION;  Surgeon: TAlexis Frock MD;  [facility]  Service: Urology;  Laterality: Right;  . LIVER BIOPSY    . OTHER SURGICAL HISTORY Right    removal of ovarian cyst  . removal of uterine cyst     years ago  . RIGHT VATS W/ DRAINAGE PEURAL EFFUSION AND BX'S  10-30-2008    SOCIAL HISTORY: Social History   Social History  . Marital Status: Single    Spouse Name: N/A  . Number of Children: 2  . Years of Education: N/A   Occupational History  . 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 Smoking status: Never Smoker   . Smokeless tobacco: Not on file  . Alcohol Use: No  . Drug Use: No  . Sexual Activity: Not Currently    Birth Control/ Protection:  Abstinence   Other Topics Concern  . Not on file   Social History Narrative    FAMILY HISTORY: Family History  Problem Relation Age of Onset  . Stomach cancer Mother   . Lung disease Father   . Asthma Father     ALLERGIES:  has No Known Allergies.  MEDICATIONS:  Current Outpatient Prescriptions  Medication Sig Dispense Refill  . acyclovir (ZOVIRAX) 800 MG tablet Take 1 tablet (800 mg total) by mouth 2 (two) times daily. 60 tablet 2  . chlorhexidine (PERIDEX) 0.12 % solution RINSE FOR 30 SECONDS WITH 15 ML IN THE MORNING AND EVENING FOR 2 WEEKS .BEGIN AFTER 24 HOURS  0  . cycloSPORINE (RESTASIS MULTIDOSE) 0.05 % ophthalmic emulsion Place 1 drop into both eyes 2 times daily.    Marland Kitchen docusate sodium (COLACE) 100 MG capsule Take 1 capsule (100 mg total) by mouth 2 (two) times daily. 60 capsule 1  . entecavir (BARACLUDE)  0.5 MG tablet Take 0.5 mg by mouth daily.    Marland Kitchen esomeprazole (NEXIUM) 40 MG capsule Take 1 capsule (40 mg total) by mouth daily at 12 noon. 30 capsule 2  . HYDROcodone-acetaminophen (NORCO/VICODIN) 5-325 MG tablet Take 1 tablet by mouth every 6 (six) hours as needed.  0  . hydrocortisone (ANUSOL-HC) 2.5 % rectal cream Place 1 application rectally 2 (two) times daily. 30 g 0  . ibuprofen (ADVIL,MOTRIN) 600 MG tablet Take 1 tablet by mouth every 6 (six) hours as needed.  0  . metroNIDAZOLE (METROGEL) 0.75 % vaginal gel insert 1 applicatorful vaginally once daily at bedtime for 5 days  0  . Olopatadine HCl (PAZEO) 0.7 % SOLN Place 1 drop into both eyes every morning.    Marland Kitchen omeprazole (PRILOSEC) 20 MG capsule Take 1 capsule (20 mg total) by mouth daily. 30 capsule 3  . ondansetron (ZOFRAN) 8 MG tablet Take 1 tablet (8 mg total) by mouth every 8 (eight) hours as needed for nausea or vomiting. 30 tablet 1  . penicillin v potassium (VEETID) 500 MG tablet Take 1 tablet by mouth 4 (four) times daily.  0  . polyethylene glycol powder (MIRALAX) powder Take 255 g by mouth daily. Take one capful mixed in water daily. 255 g 0  . potassium chloride SA (K-DUR,KLOR-CON) 20 MEQ tablet Take 1 tablet (20 mEq total) by mouth daily. Take 1 tablet ( 20 meq ) by mouth daily for 7 days;  Then as directed. 20 tablet 0  . sulfamethoxazole-trimethoprim (BACTRIM DS,SEPTRA DS) 800-160 MG tablet Take 1 tablet by mouth every Monday, Wednesday, and Friday. Reported on 06/13/2015 20 tablet 1  . clindamycin (CLEOCIN) 300 MG capsule Take 600 mg by mouth.     No current facility-administered medications for this visit.    Facility-Administered Medications Ordered in Other Visits  Medication Dose Route Frequency Provider Last Rate Last Dose  . sodium chloride 0.9 % injection 10 mL  10 mL Intravenous PRN Truitt Merle, MD   10 mL at 12/11/15 1532  . sodium chloride flush (NS) 0.9 % injection 10 mL  10 mL Intravenous PRN Truitt Merle, MD   10 mL  at 10/09/15 1717    REVIEW OF SYSTEMS:   Constitutional: fever 2 days ago, resolved, no chills or abnormal night sweats Eyes: Denies blurriness of vision, double vision or watery eyes Ears, nose, mouth, throat, and face: Denies mucositis or sore throat Respiratory: Denies cough, dyspnea or wheezes Cardiovascular: Denies palpitation, chest discomfort or lower extremity swelling Gastrointestinal:  Denies  nausea, heartburn or change in bowel habits Skin: Denies abnormal skin rashes Lymphatics: Denies new lymphadenopathy or easy bruising Neurological:Denies numbness, tingling or new weaknesses Behavioral/Psych: Mood is stable, no new changes  All other systems were reviewed with the patient and are negative.  PHYSICAL EXAMINATION: ECOG PERFORMANCE STATUS: 2  Vitals:   01/16/16 1246  BP: 101/69  Pulse: 96  Resp: 18  Temp: (!) 96.7 F (35.9 C)   Filed Weights   01/16/16 1246  Weight: 129 lb 4.8 oz (58.7 kg)    GENERAL:alert, no distress and comfortable SKIN: skin color, texture, turgor are normal, no rashes or significant lesions EYES: normal, conjunctiva are pink and non-injected, sclera clear OROPHARYNX:no exudate, no erythema and lips, buccal mucosa, (+) mild oral thrush on the tongue NECK: supple, thyroid normal size, non-tender, without nodularity LYMPH:  no palpable lymphadenopathy in the cervical, axillary or inguinal LUNGS: clear to auscultation and percussion with normal breathing effort HEART: regular rate & rhythm and no murmurs and no lower extremity edema ABDOMEN:abdomen soft, non-tender and normal bowel sounds Musculoskeletal:no cyanosis of digits and no clubbing  PSYCH: alert & oriented x 3 with fluent speech NEURO: no focal motor/sensory deficits  LABORATORY DATA:  I have reviewed the data as listed CBC Latest Ref Rng & Units 01/16/2016 01/02/2016 12/25/2015  WBC 3.9 - 10.3 10e3/uL 4.9 5.4 6.3  Hemoglobin 11.6 - 15.9 g/dL 11.0(L) 11.0(L) 11.6  Hematocrit  34.8 - 46.6 % 34.4(L) 33.6(L) 36.1  Platelets 145 - 400 10e3/uL 278 239 204    CMP Latest Ref Rng & Units 01/16/2016 01/02/2016 12/25/2015  Glucose 70 - 140 mg/dl 76 102 77  BUN 7.0 - 26.0 mg/dL 15.0 11.3 16.4  Creatinine 0.6 - 1.1 mg/dL 0.7 0.7 0.7  Sodium 136 - 145 mEq/L 141 143 140  Potassium 3.5 - 5.1 mEq/L 4.1 3.7 3.8  Chloride 101 - 111 mmol/L - - -  CO2 22 - 29 mEq/L '25 26 25  ' Calcium 8.4 - 10.4 mg/dL 9.8 9.4 10.0  Total Protein 6.4 - 8.3 g/dL 8.1 7.1 8.3  Total Bilirubin 0.20 - 1.20 mg/dL 0.25 0.22 0.33  Alkaline Phos 40 - 150 U/L 225(H) 163(H) 187(H)  AST 5 - 34 U/L 29 25 41(H)  ALT 0 - 55 U/L '19 8 15    ' SPEP M-protein  09/15/2014: 4.2 10/08/14: 4.6 12/08/2014: 0.2 02/15/2015: not sufficient sample for test   09/11/2015: not observed 10/09/2015: not det   11/06/2015: not det  12/25/2015: not det   IgG mg/dl 11/03/2014: 3150  12/08/2014:  759 02/14/2014: 860 09/11/2015: 1347 10/09/2015: 1457 11/06/2015: 1504 12/25/2015: 1400  Kappa/lambda light chains levels and ration  11/03/14: 0.75, 152, 0.00 12/22/2014: 1.10, 2.60, 0.42 02/15/2015: 9.59,14.35, 0.67 09/11/2015: 2.44, 2.72, 0.90 10/09/2015: 3.09, 3.49, 0.89 11/06/2015: 2.30, 2.62, 0.88 12/25/2015: 2.51, 3.0, 0.84  24 h urine UPEP/IFE and light chain: 11/03/2014: IFE showed a monoclonal IgG heavy chain with associated lambda light chain. M protein was Undetectable    PATHOLOGY REPORT  Bone Marrow (BM) and Peripheral Blood (PB) FINAL PATHOLOGIC DIAGNOSIS  BONE MARROW: 07/11/2015 Hypocellular marrow (20%) with no increase in plasma cells (2%). See comment.  PERIPHERAL BLOOD: Mild anemia. No circulating plasma cells identified. See comment and CBC data.  COMMENT: The bone marrow core biopsy and clot section are hypocellular for age (20%, 10-40% range). There is no increase in plasma cells, which is confirmed by CD138 immunohistochemistry. Myeloid and erythroid elements are present in normal proportion. Megakaryocytes  are morphologically unremarkable. There  are no atypical lymphoid aggregates or granulomas.  The bone marrow aspirate smears are adequate for evaluation. Plasma cells are not increased in numbers (2%). There is no increase in blasts (<1%). Myeloid and erythroid elements are present in normal proportion with a M:E ratio of 2.5:1. There is no overt dysplasia of the myeloid, erythroid, or megakaryocytic lineages. There is no lymphocytosis.  Examination of peripheral blood reveals no circulating plasma cells or rouleaux formation. The background leukocytes are morphologically unremarkable and consist mostly of neutrophils.  Immunohistochemical controls worked appropriately.   RADIOGRAPHIC STUDIES: I have personally reviewed the radiological images as listed and agreed with the findings in the report. No results found.  ASSESSMENT & PLAN: 45 year old [place], presented with anemia and leokocytosis   1. 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 Norma Fredrickson recommendation to start maintenance chemotherapy with CyBorD every 2 weeks, for at least 4 months, she has been tolerating well overall -Per Dr. Norma Fredrickson recommendation, we'll change her maintenance therapy to Velcade 1.3 mg/m every 2 weeks, indefinitely, want to disease progression -We'll continue monitoring her SPEP, quantitative immunoglobulin and light chains level every 4 weeks. She has had no detected M-protein, normal  IgG and light chain ratio since transplant  -continue acyclovir  -Continue Zometa every 4 weeks for a total of 2 years (until 11/2017) -Lab review, adequate for treatment, we'll proceed with Velcade injection  today.  2. Type 2 diabetes, steroids induced  -She was noticed to have increased blood glucose level lately, with random blood glucose above 200. She also developed diabetic symptoms. No history of diabetes in the past. -This is likely steroids induced hyperglycemia -I strongly recommend her to avoid any soft drink and sweats, and watch her carbohydrate intake, and exercise more  -She has been seen by her primary care physician  -We'll stop her dexamethasone from now on, her blood glucose likely will improve.  3. Chronic Hepatitis B  - She will follow-up GI clinic [facility] -continue entecavir   4. GERD -Continue omeprazole daily. We discussed steroids can worsen her acid reflux.  Plan -she will start Velcade maintenance therapy every 2 weeks today -next zometa due in 2 weeks  -Lab and the port flushed every 4 weeks -Return to clinical follow-up in 8 weeks   All questions were answered. The patient knows to call the clinic with any problems, questions or concerns.  I spent 20 minutes counseling the patient face to face. We used the vedio interpreter service. The total time spent in the appointment was 25 minutes and more than 50% was on counseling.     Truitt Merle, MD 01/16/2016

## 2016-01-30 ENCOUNTER — Ambulatory Visit: Payer: BLUE CROSS/BLUE SHIELD

## 2016-01-30 ENCOUNTER — Ambulatory Visit (HOSPITAL_BASED_OUTPATIENT_CLINIC_OR_DEPARTMENT_OTHER): Payer: BLUE CROSS/BLUE SHIELD

## 2016-01-30 ENCOUNTER — Other Ambulatory Visit: Payer: BLUE CROSS/BLUE SHIELD

## 2016-01-30 ENCOUNTER — Ambulatory Visit: Payer: BLUE CROSS/BLUE SHIELD | Admitting: Hematology

## 2016-01-30 VITALS — BP 102/63 | HR 96 | Temp 97.8°F | Resp 20

## 2016-01-30 DIAGNOSIS — Z9484 Stem cells transplant status: Secondary | ICD-10-CM

## 2016-01-30 DIAGNOSIS — C9011 Plasma cell leukemia in remission: Secondary | ICD-10-CM | POA: Diagnosis not present

## 2016-01-30 DIAGNOSIS — Z95828 Presence of other vascular implants and grafts: Secondary | ICD-10-CM

## 2016-01-30 DIAGNOSIS — Z5112 Encounter for antineoplastic immunotherapy: Secondary | ICD-10-CM

## 2016-01-30 DIAGNOSIS — C9010 Plasma cell leukemia not having achieved remission: Secondary | ICD-10-CM

## 2016-01-30 MED ORDER — PROCHLORPERAZINE MALEATE 10 MG PO TABS
ORAL_TABLET | ORAL | Status: AC
  Filled 2016-01-30: qty 1

## 2016-01-30 MED ORDER — ZOLEDRONIC ACID 4 MG/100ML IV SOLN
4.0000 mg | Freq: Once | INTRAVENOUS | Status: AC
  Administered 2016-01-30: 4 mg via INTRAVENOUS
  Filled 2016-01-30: qty 100

## 2016-01-30 MED ORDER — BORTEZOMIB CHEMO SQ INJECTION 3.5 MG (2.5MG/ML)
1.3000 mg/m2 | Freq: Once | INTRAMUSCULAR | Status: AC
  Administered 2016-01-30: 2 mg via SUBCUTANEOUS
  Filled 2016-01-30: qty 2

## 2016-01-30 MED ORDER — SODIUM CHLORIDE 0.9 % IJ SOLN
10.0000 mL | INTRAMUSCULAR | Status: DC | PRN
  Administered 2016-01-30: 10 mL via INTRAVENOUS
  Filled 2016-01-30: qty 10

## 2016-01-30 MED ORDER — SODIUM CHLORIDE 0.9 % IV SOLN
Freq: Once | INTRAVENOUS | Status: AC
  Administered 2016-01-30: 08:00:00 via INTRAVENOUS

## 2016-01-30 MED ORDER — HEPARIN SOD (PORK) LOCK FLUSH 100 UNIT/ML IV SOLN
500.0000 [IU] | Freq: Once | INTRAVENOUS | Status: AC | PRN
  Administered 2016-01-30: 500 [IU] via INTRAVENOUS
  Filled 2016-01-30: qty 5

## 2016-01-30 MED ORDER — PROCHLORPERAZINE MALEATE 10 MG PO TABS
10.0000 mg | ORAL_TABLET | Freq: Once | ORAL | Status: AC
  Administered 2016-01-30: 10 mg via ORAL

## 2016-01-30 NOTE — Patient Instructions (Addendum)
[facility] Discharge Instructions for Patients Receiving Chemotherapy  Today you received the following chemotherapy agents Velcade (also Zometa)  To help prevent nausea and vomiting after your treatment, we encourage you to take your nausea medication   If you develop nausea and vomiting that is not controlled by your nausea medication, call the clinic.   BELOW ARE SYMPTOMS THAT SHOULD BE REPORTED IMMEDIATELY:  *FEVER GREATER THAN 100.5 F  *CHILLS WITH OR WITHOUT FEVER  NAUSEA AND VOMITING THAT IS NOT CONTROLLED WITH YOUR NAUSEA MEDICATION  *UNUSUAL SHORTNESS OF BREATH  *UNUSUAL BRUISING OR BLEEDING  TENDERNESS IN MOUTH AND THROAT WITH OR WITHOUT PRESENCE OF ULCERS  *URINARY PROBLEMS  *BOWEL PROBLEMS  UNUSUAL RASH Items with * indicate a potential emergency and should be followed up as soon as possible.  Feel free to call the clinic you have any questions or concerns. The clinic phone number is [phone].  Please show the [place] at check-in to the Emergency Department and triage nurse.

## 2016-02-13 ENCOUNTER — Other Ambulatory Visit (HOSPITAL_BASED_OUTPATIENT_CLINIC_OR_DEPARTMENT_OTHER): Payer: BLUE CROSS/BLUE SHIELD

## 2016-02-13 ENCOUNTER — Ambulatory Visit: Payer: BLUE CROSS/BLUE SHIELD

## 2016-02-13 ENCOUNTER — Ambulatory Visit (HOSPITAL_BASED_OUTPATIENT_CLINIC_OR_DEPARTMENT_OTHER): Payer: BLUE CROSS/BLUE SHIELD

## 2016-02-13 VITALS — BP 118/80 | HR 99 | Temp 98.4°F | Resp 18

## 2016-02-13 DIAGNOSIS — C9010 Plasma cell leukemia not having achieved remission: Secondary | ICD-10-CM

## 2016-02-13 DIAGNOSIS — C9011 Plasma cell leukemia in remission: Secondary | ICD-10-CM

## 2016-02-13 DIAGNOSIS — Z5112 Encounter for antineoplastic immunotherapy: Secondary | ICD-10-CM

## 2016-02-13 DIAGNOSIS — Z95828 Presence of other vascular implants and grafts: Secondary | ICD-10-CM

## 2016-02-13 LAB — CBC WITH DIFFERENTIAL/PLATELET
BASO%: 0.4 % (ref 0.0–2.0)
BASOS ABS: 0 10*3/uL (ref 0.0–0.1)
EOS%: 2.7 % (ref 0.0–7.0)
Eosinophils Absolute: 0.2 10*3/uL (ref 0.0–0.5)
HEMATOCRIT: 32.5 % — AB (ref 34.8–46.6)
HGB: 10.8 g/dL — ABNORMAL LOW (ref 11.6–15.9)
LYMPH#: 0.8 10*3/uL — AB (ref 0.9–3.3)
LYMPH%: 11.7 % — AB (ref 14.0–49.7)
MCH: 29.3 pg (ref 25.1–34.0)
MCHC: 33.1 g/dL (ref 31.5–36.0)
MCV: 88.5 fL (ref 79.5–101.0)
MONO#: 0.8 10*3/uL (ref 0.1–0.9)
MONO%: 10.6 % (ref 0.0–14.0)
NEUT#: 5.4 10*3/uL (ref 1.5–6.5)
NEUT%: 74.6 % (ref 38.4–76.8)
PLATELETS: 272 10*3/uL (ref 145–400)
RBC: 3.67 10*6/uL — AB (ref 3.70–5.45)
RDW: 13.5 % (ref 11.2–14.5)
WBC: 7.3 10*3/uL (ref 3.9–10.3)

## 2016-02-13 LAB — COMPREHENSIVE METABOLIC PANEL
ALT: <6 U/L (ref 0–55)
ANION GAP: 8 meq/L (ref 3–11)
AST: 23 U/L (ref 5–34)
Albumin: 3.3 g/dL — ABNORMAL LOW (ref 3.5–5.0)
Alkaline Phosphatase: 99 U/L (ref 40–150)
BUN: 17.3 mg/dL (ref 7.0–26.0)
CALCIUM: 9.6 mg/dL (ref 8.4–10.4)
CHLORIDE: 106 meq/L (ref 98–109)
CO2: 26 mEq/L (ref 22–29)
Creatinine: 0.7 mg/dL (ref 0.6–1.1)
Glucose: 82 mg/dl (ref 70–140)
POTASSIUM: 4.1 meq/L (ref 3.5–5.1)
Sodium: 139 mEq/L (ref 136–145)
Total Bilirubin: 0.28 mg/dL (ref 0.20–1.20)
Total Protein: 7.8 g/dL (ref 6.4–8.3)

## 2016-02-13 MED ORDER — PROCHLORPERAZINE MALEATE 10 MG PO TABS
ORAL_TABLET | ORAL | Status: AC
  Filled 2016-02-13: qty 1

## 2016-02-13 MED ORDER — BORTEZOMIB CHEMO SQ INJECTION 3.5 MG (2.5MG/ML)
1.3000 mg/m2 | Freq: Once | INTRAMUSCULAR | Status: AC
  Administered 2016-02-13: 2 mg via SUBCUTANEOUS
  Filled 2016-02-13: qty 2

## 2016-02-13 MED ORDER — SODIUM CHLORIDE 0.9% FLUSH
10.0000 mL | INTRAVENOUS | Status: DC | PRN
  Administered 2016-02-13: 10 mL
  Filled 2016-02-13: qty 10

## 2016-02-13 MED ORDER — HEPARIN SOD (PORK) LOCK FLUSH 100 UNIT/ML IV SOLN
500.0000 [IU] | Freq: Once | INTRAVENOUS | Status: AC | PRN
  Administered 2016-02-13: 500 [IU]
  Filled 2016-02-13: qty 5

## 2016-02-13 MED ORDER — PROCHLORPERAZINE MALEATE 10 MG PO TABS
10.0000 mg | ORAL_TABLET | Freq: Once | ORAL | Status: AC
  Administered 2016-02-13: 10 mg via ORAL

## 2016-02-13 MED ORDER — ALTEPLASE 2 MG IJ SOLR
2.0000 mg | Freq: Once | INTRAMUSCULAR | Status: DC | PRN
  Filled 2016-02-13: qty 2

## 2016-02-13 NOTE — Progress Notes (Signed)
Patient states she has been having bright red blood in her stool for the past week. Dr. Burr Medico notified and to see patient at bedside. Per Dr. Burr Medico okay to proceed with treatment today.

## 2016-02-13 NOTE — Patient Instructions (Signed)

## 2016-02-13 NOTE — Patient Instructions (Signed)
[facility] Discharge Instructions for Patients Receiving Chemotherapy  Today you received the following chemotherapy agents Velcade. To help prevent nausea and vomiting after your treatment, we encourage you to take your nausea medication as directed.  If you develop nausea and vomiting that is not controlled by your nausea medication, call the clinic.   BELOW ARE SYMPTOMS THAT SHOULD BE REPORTED IMMEDIATELY:  *FEVER GREATER THAN 100.5 F  *CHILLS WITH OR WITHOUT FEVER  NAUSEA AND VOMITING THAT IS NOT CONTROLLED WITH YOUR NAUSEA MEDICATION  *UNUSUAL SHORTNESS OF BREATH  *UNUSUAL BRUISING OR BLEEDING  TENDERNESS IN MOUTH AND THROAT WITH OR WITHOUT PRESENCE OF ULCERS  *URINARY PROBLEMS  *BOWEL PROBLEMS  UNUSUAL RASH Items with * indicate a potential emergency and should be followed up as soon as possible.  Feel free to call the clinic you have any questions or concerns. The clinic phone number is [phone].  Please show the CHEMO ALERT CARD at check-in to the Emergency Department and triage nurse.    

## 2016-02-14 ENCOUNTER — Emergency Department (HOSPITAL_COMMUNITY)
Admission: EM | Admit: 2016-02-14 | Discharge: 2016-02-14 | Disposition: A | Discharge status: Home / Self Care | Payer: BLUE CROSS/BLUE SHIELD | Attending: Emergency Medicine | Admitting: Emergency Medicine

## 2016-02-14 ENCOUNTER — Telehealth: Payer: Self-pay | Admitting: *Deleted

## 2016-02-14 ENCOUNTER — Encounter (HOSPITAL_COMMUNITY): Payer: Self-pay

## 2016-02-14 ENCOUNTER — Telehealth: Payer: Self-pay

## 2016-02-14 DIAGNOSIS — R197 Diarrhea, unspecified: Secondary | ICD-10-CM | POA: Insufficient documentation

## 2016-02-14 DIAGNOSIS — R112 Nausea with vomiting, unspecified: Secondary | ICD-10-CM | POA: Insufficient documentation

## 2016-02-14 DIAGNOSIS — T451X5A Adverse effect of antineoplastic and immunosuppressive drugs, initial encounter: Secondary | ICD-10-CM | POA: Insufficient documentation

## 2016-02-14 DIAGNOSIS — Z79899 Other long term (current) drug therapy: Secondary | ICD-10-CM | POA: Diagnosis not present

## 2016-02-14 DIAGNOSIS — T887XXA Unspecified adverse effect of drug or medicament, initial encounter: Secondary | ICD-10-CM

## 2016-02-14 HISTORY — DX: Plasma cell leukemia not having achieved remission: C90.10

## 2016-02-14 LAB — COMPREHENSIVE METABOLIC PANEL
ALT: 17 U/L (ref 14–54)
ANION GAP: 9 (ref 5–15)
AST: 45 U/L — ABNORMAL HIGH (ref 15–41)
Albumin: 3.8 g/dL (ref 3.5–5.0)
Alkaline Phosphatase: 98 U/L (ref 38–126)
BUN: 17 mg/dL (ref 6–20)
CALCIUM: 9 mg/dL (ref 8.9–10.3)
CHLORIDE: 105 mmol/L (ref 101–111)
CO2: 22 mmol/L (ref 22–32)
Creatinine, Ser: 0.72 mg/dL (ref 0.44–1.00)
GFR calc non Af Amer: >60 mL/min (ref >60)
Glucose, Bld: 103 mg/dL — ABNORMAL HIGH (ref 65–99)
Potassium: 3.6 mmol/L (ref 3.5–5.1)
SODIUM: 136 mmol/L (ref 135–145)
Total Bilirubin: 0.7 mg/dL (ref 0.3–1.2)
Total Protein: 8 g/dL (ref 6.5–8.1)

## 2016-02-14 LAB — CBC WITH DIFFERENTIAL/PLATELET
BASOS ABS: 0 10*3/uL (ref 0.0–0.1)
BASOS PCT: 0 %
EOS ABS: 0.1 10*3/uL (ref 0.0–0.7)
EOS PCT: 1 %
HCT: 34.3 % — ABNORMAL LOW (ref 36.0–46.0)
Hemoglobin: 11.4 g/dL — ABNORMAL LOW (ref 12.0–15.0)
LYMPHS PCT: 15 %
Lymphs Abs: 1.3 10*3/uL (ref 0.7–4.0)
MCH: 28.5 pg (ref 26.0–34.0)
MCHC: 33.2 g/dL (ref 30.0–36.0)
MCV: 85.8 fL (ref 78.0–100.0)
Monocytes Absolute: 0.7 10*3/uL (ref 0.1–1.0)
Monocytes Relative: 8 %
Neutro Abs: 6.8 10*3/uL (ref 1.7–7.7)
Neutrophils Relative %: 76 %
PLATELETS: 283 10*3/uL (ref 150–400)
RBC: 4 MIL/uL (ref 3.87–5.11)
RDW: 12.9 % (ref 11.5–15.5)
WBC: 9 10*3/uL (ref 4.0–10.5)

## 2016-02-14 LAB — KAPPA/LAMBDA LIGHT CHAINS
IG KAPPA FREE LIGHT CHAIN: 26.4 mg/L — AB (ref 3.3–19.4)
Ig Lambda Free Light Chain: 153.4 mg/L — ABNORMAL HIGH (ref 5.7–26.3)
Kappa/Lambda FluidC Ratio: 0.17 — ABNORMAL LOW (ref 0.26–1.65)

## 2016-02-14 LAB — I-STAT BETA HCG BLOOD, ED (MC, WL, AP ONLY): I-stat hCG, quantitative: <5 m[IU]/mL (ref <5)

## 2016-02-14 LAB — LIPASE, BLOOD: LIPASE: 20 U/L (ref 11–51)

## 2016-02-14 MED ORDER — HEPARIN SOD (PORK) LOCK FLUSH 100 UNIT/ML IV SOLN
500.0000 [IU] | Freq: Once | INTRAVENOUS | Status: AC
  Administered 2016-02-14: 500 [IU]
  Filled 2016-02-14: qty 5

## 2016-02-14 MED ORDER — BISMUTH SUBSALICYLATE 262 MG/15ML PO SUSP
30.0000 mL | Freq: Once | ORAL | Status: AC
  Administered 2016-02-14: 30 mL via ORAL
  Filled 2016-02-14: qty 118

## 2016-02-14 MED ORDER — SODIUM CHLORIDE 0.9 % IV BOLUS (SEPSIS)
1000.0000 mL | Freq: Once | INTRAVENOUS | Status: AC
  Administered 2016-02-14: 1000 mL via INTRAVENOUS

## 2016-02-14 MED ORDER — METOCLOPRAMIDE HCL 5 MG/ML IJ SOLN
10.0000 mg | Freq: Once | INTRAMUSCULAR | Status: AC
  Administered 2016-02-14: 10 mg via INTRAVENOUS
  Filled 2016-02-14: qty 2

## 2016-02-14 NOTE — ED Provider Notes (Signed)
[facility] DEPT Provider Note   CSN: [id number] Arrival date & time: 02/14/16  1830     History   Chief Complaint Chief Complaint  Patient presents with  . CA Pt  . Abdominal Pain  . Emesis    HPI Chaye Fatima is a 46 y.o. female.  The history is provided by the patient. The history is limited by a language barrier. No language interpreter was used (family member translated).  Emesis   This is a new problem. The current episode started 12 to 24 hours ago. The problem occurs 5 to 10 times per day. The problem has been gradually improving. The emesis has an appearance of stomach contents. There has been no fever. Associated symptoms include abdominal pain (diffuse cramping) and diarrhea (x2 loose stools). Risk factors: receiving chometherapy, second dose of velcaid maintenance chemo was yesterday.    Past Medical History:  Diagnosis Date  . Chills with fever    intermittently since d/c from hospital  . Dysuria-frequency syndrome    w/ pink urine  . GERD (gastroesophageal reflux disease)   . Hepatitis   . History of positive PPD    DX 2011--  CXR DONE NO EVIDENCE  . History of ureter stent   . Hydronephrosis, right   . Neuromuscular disorder (HCC)    legs numb intermittently  . 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 Pneumonia   . Right ureteral stone   . Urosepsis 8/14   admitted to [facility]    Patient Active Problem List   Diagnosis Date Noted  . Hemorrhoid 12/03/2015  . Headache 12/03/2015  . Dehydration 11/14/2015  . Fever 11/14/2015  . 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 Port catheter in place 05/30/2015  . Acute pyelonephritis   . Immunosuppressed status ([place])   . UTI (urinary tract infection) 05/21/2015  . Sepsis ([place]) 05/20/2015  . Hepatitis 10/24/2014  . 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 GIB (gastrointestinal bleeding) 10/07/2014  . Healthcare-associated pneumonia 10/07/2014  . Constipation 09/17/2014  . Leukocytosis 09/14/2014  . Nausea &  vomiting 09/14/2014  . Abdominal pain 09/14/2014  . Abnormal LFTs 09/14/2014  . Bilateral leg numbness 09/14/2014  . Normocytic anemia 09/14/2014  . GERD (gastroesophageal reflux disease)   . Hepatitis B   . Gastroesophageal reflux disease without esophagitis     Past Surgical History:  Procedure Laterality Date  . CYSTOSCOPY W/ URETERAL STENT PLACEMENT Right 09/25/2012   Procedure: CYSTOSCOPY WITH RETROGRADE PYELOGRAM/URETERAL STENT PLACEMENT;  Surgeon: Alexis Frock, MD;  Location: [facility];  Service: Urology;  Laterality: Right;  . CYSTOSCOPY WITH RETROGRADE PYELOGRAM, URETEROSCOPY AND STENT PLACEMENT Right 10/15/2012   Procedure: CYSTOSCOPY WITH RETROGRADE PYELOGRAM, URETEROSCOPY AND REMOVAL STENT WITH  STENT PLACEMENT;  Surgeon: Alexis Frock, MD;  Location: [facility];  Service: Urology;  Laterality: Right;  . ESOPHAGOGASTRODUODENOSCOPY (EGD) WITH PROPOFOL N/A 11/16/2014   Procedure: ESOPHAGOGASTRODUODENOSCOPY (EGD) WITH PROPOFOL;  Surgeon: Milus Banister, MD;  Location: [facility];  Service: Endoscopy;  Laterality: N/A;  . HOLMIUM LASER APPLICATION Right [id number]   Procedure: HOLMIUM LASER APPLICATION;  Surgeon: Alexis Frock, MD;  Location: [facility];  Service: Urology;  Laterality: Right;  . LIVER BIOPSY    . OTHER SURGICAL HISTORY Right    removal of ovarian cyst  . removal of uterine cyst     years ago  . RIGHT VATS W/ DRAINAGE PEURAL EFFUSION AND BX'S  10-30-2008    OB History    No data available  Home Medications    Prior to Admission medications   Medication Sig Start Date End Date Taking? Authorizing Provider  acyclovir (ZOVIRAX) 800 MG tablet Take 1 tablet (800 mg total) by mouth 2 (two) times daily. 12/11/15   Truitt Merle, MD  chlorhexidine (PERIDEX) 0.12 % solution RINSE FOR 30 SECONDS WITH 15 ML IN THE MORNING AND EVENING FOR 2 WEEKS .BEGIN AFTER 24 HOURS 12/18/15   Historical Provider, MD  clindamycin (CLEOCIN) 300 MG  capsule Take 600 mg by mouth. 10/10/15   Historical Provider, MD  cycloSPORINE (RESTASIS MULTIDOSE) 0.05 % ophthalmic emulsion Place 1 drop into both eyes 2 times daily. 09/05/15   Historical Provider, MD  docusate sodium (COLACE) 100 MG capsule Take 1 capsule (100 mg total) by mouth 2 (two) times daily. 12/03/15   Maryanna Shape, NP  entecavir (BARACLUDE) 0.5 MG tablet Take 0.5 mg by mouth daily.    Historical Provider, MD  esomeprazole (NEXIUM) 40 MG capsule Take 1 capsule (40 mg total) by mouth daily at 12 noon. 11/20/15   Truitt Merle, MD  HYDROcodone-acetaminophen (NORCO/VICODIN) 5-325 MG tablet Take 1 tablet by mouth every 6 (six) hours as needed. 12/18/15   Historical Provider, MD  hydrocortisone (ANUSOL-HC) 2.5 % rectal cream Place 1 application rectally 2 (two) times daily. 12/03/15   Maryanna Shape, NP  ibuprofen (ADVIL,MOTRIN) 600 MG tablet Take 1 tablet by mouth every 6 (six) hours as needed. 12/18/15   Historical Provider, MD  metroNIDAZOLE (METROGEL) 0.75 % vaginal gel insert 1 applicatorful vaginally once daily at bedtime for 5 days 12/12/15   Historical Provider, MD  Olopatadine HCl (PAZEO) 0.7 % SOLN Place 1 drop into both eyes every morning. 09/05/15   Historical Provider, MD  omeprazole (PRILOSEC) 20 MG capsule Take 1 capsule (20 mg total) by mouth daily. 12/11/15   Truitt Merle, MD  ondansetron (ZOFRAN) 8 MG tablet Take 1 tablet (8 mg total) by mouth every 8 (eight) hours as needed for nausea or vomiting. 11/20/15   Truitt Merle, MD  penicillin v potassium (VEETID) 500 MG tablet Take 1 tablet by mouth 4 (four) times daily. 12/18/15   Historical Provider, MD  polyethylene glycol powder (MIRALAX) powder Take 255 g by mouth daily. Take one capful mixed in water daily. 12/03/15   Maryanna Shape, NP  potassium chloride SA (K-DUR,KLOR-CON) 20 MEQ tablet Take 1 tablet (20 mEq total) by mouth daily. Take 1 tablet ( 20 meq ) by mouth daily for 7 days;  Then as directed. 09/11/15   Truitt Merle, MD    sulfamethoxazole-trimethoprim (BACTRIM DS,SEPTRA DS) 800-160 MG tablet Take 1 tablet by mouth every Monday, Wednesday, and Friday. Reported on 06/13/2015 06/13/15   Truitt Merle, MD    Family History Family History  Problem Relation Age of Onset  . Stomach cancer Mother   . Lung disease Father   . Asthma Father     Social History Social History  Substance Use Topics  . Smoking status: Never Smoker  . Smokeless tobacco: Never Used  . Alcohol use No     Allergies   Patient has no known allergies.   Review of Systems Review of Systems  Gastrointestinal: Positive for abdominal pain (diffuse cramping), diarrhea (x2 loose stools) and vomiting.  All other systems reviewed and are negative.    Physical Exam Updated Vital Signs BP 109/76 (BP Location: Right Arm)   Pulse 93   Temp 98.5 F (36.9 C) (Oral)   Resp 18   SpO2 99%  Physical Exam  Constitutional: She is oriented to person, place, and time. She appears well-developed and well-nourished. No distress.  HENT:  Head: Normocephalic.  Nose: Nose normal.  Eyes: Conjunctivae are normal.  Neck: Neck supple. No tracheal deviation present.  Cardiovascular: Normal rate, regular rhythm and normal heart sounds.   Pulmonary/Chest: Effort normal and breath sounds normal. No respiratory distress.  Abdominal: Soft. She exhibits no distension. There is no tenderness. There is no rebound and no guarding.  Neurological: She is alert and oriented to person, place, and time.  Skin: Skin is warm and dry.  Psychiatric: She has a normal mood and affect.  Vitals reviewed.    ED Treatments / Results  Labs (all labs ordered are listed, but only abnormal results are displayed) Labs Reviewed  COMPREHENSIVE METABOLIC PANEL - Abnormal; Notable for the following:       Result Value   Glucose, Bld 103 (*)    AST 45 (*)    All other components within normal limits  CBC WITH DIFFERENTIAL/PLATELET - Abnormal; Notable for the following:     Hemoglobin 11.4 (*)    HCT 34.3 (*)    All other components within normal limits  LIPASE, BLOOD  I-STAT BETA HCG BLOOD, ED ([facility], [facility], [facility] ONLY)    EKG  EKG Interpretation None       Radiology No results found.  Procedures Procedures (including critical care time)  Medications Ordered in ED Medications  bismuth subsalicylate (PEPTO BISMOL) 262 MG/15ML suspension 30 mL (30 mLs Oral Given 02/14/16 2131)  metoCLOPramide (REGLAN) injection 10 mg (10 mg Intravenous Given 02/14/16 1930)  sodium chloride 0.9 % bolus 1,000 mL (0 mLs Intravenous Stopped 02/14/16 2038)  heparin lock flush 100 unit/mL (500 Units Intracatheter Given 02/14/16 2217)     Initial Impression / Assessment and Plan / ED Course  I have reviewed the triage vital signs and the nursing notes.  Pertinent labs & imaging results that were available during my care of the patient were reviewed by me and considered in my medical decision making (see chart for details).  Clinical Course     46 y.o. female presents with nausea, vomiting, and diarrhea after second dose of maintenance chemotherapy for plasma cell leukemia. Took zofran at home with some improvement. Provided reglan, fluids, and pepto bismol with relief of symptoms. Labs reassuring and exam without signs of surgical emergency. Plan to follow up with PCP as needed and return precautions discussed for worsening or new concerning symptoms.   Final Clinical Impressions(s) / ED Diagnoses   Final diagnoses:  Nausea vomiting and diarrhea  Non-dose-related adverse effect of medication, initial encounter    New Prescriptions New Prescriptions   No medications on file     Leo Grosser, MD 02/15/16 [phone]

## 2016-02-14 NOTE — Telephone Encounter (Signed)
-----   Message from Milus Banister, MD sent at 02/14/2016  7:25 AM EST ----- Luberta Robertson get her in.    Patty, Can you put her in for ROV with me in next 2-3 weeks, double book if needed in PM.  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 She reports rectal bleeding for one week, she does not speak English well, I saw her in the infusion without interpreter. It sounds like small amount, but sometime with clots. H/H stable. Could you see her soon? You did her EGD in 11/2014 for epigastric pain, she never had colonoscopy  Thanks much  Krista Blue

## 2016-02-14 NOTE — ED Triage Notes (Signed)
Pt c/o R side abdominal pain and n/v/d after receiving Velcade injection yesterday.  Pain score 6/10.  Pt reports taking prescribed medications w/o relief.  Hx of leukemia.

## 2016-02-14 NOTE — Telephone Encounter (Signed)
Called and spoke with daughter [name].  Let her know that if patient runs a fever/has a temperature or if she is unable to keep any fluids down then she needs to go to the Emergency Room tonight.  If she is not running a fever and able to keep fluids down, then we would like them to call us tomorrow before noon and let us know how she is..  Dtr. Is Goi.  She is agreeable with this.

## 2016-02-14 NOTE — Telephone Encounter (Signed)
Call from pt's daughter reporting nausea and vomiting. Began last night. Pt able to tolerate water only. Took one dose of Zofran this AM. Has vomited x4 today. Pt denies any bowel/ bladder changes. Instructed her to continue Zofran every 8 hours. Will review with provider for furhter instructions.

## 2016-02-14 NOTE — Telephone Encounter (Signed)
Pt scheduled to see Dr. Ardis Hughs 03/04/16@2 :45pm, appt letter mailed to pt.

## 2016-02-15 ENCOUNTER — Telehealth: Payer: Self-pay

## 2016-02-15 LAB — MULTIPLE MYELOMA PANEL, SERUM
ALBUMIN SERPL ELPH-MCNC: 3.3 g/dL (ref 2.9–4.4)
ALBUMIN/GLOB SERPL: 0.9 (ref 0.7–1.7)
ALPHA 1: 0.3 g/dL (ref 0.0–0.4)
Alpha2 Glob SerPl Elph-Mcnc: 1.2 g/dL — ABNORMAL HIGH (ref 0.4–1.0)
B-GLOBULIN SERPL ELPH-MCNC: 1 g/dL (ref 0.7–1.3)
GAMMA GLOB SERPL ELPH-MCNC: 1.7 g/dL (ref 0.4–1.8)
GLOBULIN, TOTAL: 4.1 g/dL — AB (ref 2.2–3.9)
IgA, Qn, Serum: 168 mg/dL (ref 87–352)
IgG, Qn, Serum: 1543 mg/dL (ref 700–1600)
IgM, Qn, Serum: 55 mg/dL (ref 26–217)
M PROTEIN SERPL ELPH-MCNC: 0.3 g/dL — AB
Total Protein: 7.4 g/dL (ref 6.0–8.5)

## 2016-02-15 NOTE — Telephone Encounter (Signed)
Goi called with update as requested by RN. Yesterday pt went to ER for fluids and nausea medicine. Now she is much better, just feels tired.

## 2016-02-18 ENCOUNTER — Encounter: Payer: Self-pay | Admitting: Hematology

## 2016-02-18 ENCOUNTER — Telehealth: Payer: Self-pay | Admitting: Hematology

## 2016-02-18 ENCOUNTER — Ambulatory Visit (HOSPITAL_BASED_OUTPATIENT_CLINIC_OR_DEPARTMENT_OTHER): Payer: BLUE CROSS/BLUE SHIELD | Admitting: Hematology

## 2016-02-18 DIAGNOSIS — E099 Drug or chemical induced diabetes mellitus without complications: Secondary | ICD-10-CM

## 2016-02-18 DIAGNOSIS — B181 Chronic viral hepatitis B without delta-agent: Secondary | ICD-10-CM

## 2016-02-18 DIAGNOSIS — C9011 Plasma cell leukemia in remission: Secondary | ICD-10-CM

## 2016-02-18 DIAGNOSIS — R110 Nausea: Secondary | ICD-10-CM

## 2016-02-18 DIAGNOSIS — K219 Gastro-esophageal reflux disease without esophagitis: Secondary | ICD-10-CM

## 2016-02-18 DIAGNOSIS — K922 Gastrointestinal hemorrhage, unspecified: Secondary | ICD-10-CM

## 2016-02-18 MED ORDER — ONDANSETRON HCL 8 MG PO TABS: 8.0000 mg | ORAL_TABLET | Freq: Three times a day (TID) | ORAL | 1 refills | Status: DC | PRN

## 2016-02-18 NOTE — Telephone Encounter (Signed)
Gave patient avs report and appointments for January and February, including bx at [facility] 02/22/16 scheduled by YF.   Patient aware to arrive to radiology 1/19 at 7 am, not to eat/drink anything past midnight the night before and to have a driver.

## 2016-02-18 NOTE — Progress Notes (Signed)
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      10/10/2014 Miscellaneous    Peripheral blood chemistry and leukocytosis with total white count 78K, comprised of large plasma cells and his normocytic anemia. There is a myeloid left shift with previous surgical radium blasts. Flow cytometry showed 64% plasma cells      10/10/2014 Bone Marrow Biopsy    Markedly hypercellular marrow (95%), Atypical plasma cells comprise 57% of the cellularity. There was diminished multilineage in hematopoiesis with adequate maturation. Breasts less than 1%), no overt dysplasia of the myeloid or erythroid lineages.       10/10/2014 Initial Diagnosis    Plasma cell leukemia      10/13/2014 - 02/22/2015 Chemotherapy    CyborD (cytoxan 390m/m2 iv, bortezomib 1.5 mg/m, dexamethasone 40 mg, weekly every 28 days, bortezomib and dexamethasone was given twice weekly for 2 weeks during the first cycle)      04/04/2015 Bone Marrow Transplant    [facility] was complicated by sepsis from Escherichia coli bacteremia and associated colitis, she was discharged home on 04/27/2015.      05/07/2015 - 05/12/2015 Hospital Admission    [facility], [facility]. ID workup was negative, EGD showed evidence of gastritis and duodenitis, no H. pylori or CMV.      05/20/2015 - 05/24/2015 Hospital Admission    [facility]      07/11/2015 Bone Marrow Biopsy    Post transplant 100 a bone marrow biopsy showed hypocellular marrow, 20%, no increase in plasma cells (2%) or other abnormalities.      08/22/2015 - 01/02/2016 Chemotherapy    MaintenanceCyborD (cytoxan 307mm2 iv, bortezomib 1.5 mg/m, dexamethasone 40 mg, every 2 weeks, changed to Velcade maintenance after 4 months treatment      01/16/2016 -  Chemotherapy    Maintenance Velcade 1.3 mg/m every 2 weeks       HISTORY OF PRESENTING ILLNESS:  Sheryl [name] [age].o. female is here because of recently diagnosed plasma cell leukemia. [facility] and is here to establish her local oncological care with [place] speak [place] is accompanied to the clinic by her daughter and interpreter.  She presented to our hospital lth with worsening dyspnea, fatigue, cough, subjective fevers and chills, and was admitted on 09/14/2014. She was found to have allergy WBC 54.1K, hemoglobin 8.1, plt 310, she was treated with symptom management, and was discharged home on 09/17/2014, with a plan to follow-up with hematology. She represents to emergency room on 10/07/2014, was found to have white count of 78K, and worsening anemia with hemoglobin 6. She was seen by my partner Dr. EnJonette Evand plasma cell leukemia was suspected. [facility] Bone marrow biopsy  was done, which confirmed plasma cell leukemia, and she started chemotherapy with Velcade, Cytoxan and dexamethasone. She received weekly dose twice, last dose on 10/20/2014. She was discharged to home afterwards. She had a mediport placed during her hospitalization.  She has moderate fatigue, low appetie, she lost about 13 lbs in the past few months. She has  moderate abdominal pain, 5-6/10, persistent, she does not take any pain meds.   I reviewed her medical records extensively, and discussed her case with Dr. Jorje Guild and [place] program coordinator.  CURRENT THERAPY:  Maintenance Velcade 1.68m/m2 every 2 weeks, started on 01/16/2016   INTERIM HISTORY: Aniylah returns for follow up. She had a tubal episodes of rectal bleeding with fresh blood mixed in the stool one week ago, and the bleeding spontaneously resolved. She denied significant abdominal pain, or fever. She has been having intermittent nausea and episodes of vomiting since last week, especially last Friday, bowel movement has been normal lately, no fever or chills or other new complains, appetite has been low, her nausea has improved some this week also. Due to abnormal lab, I called her yesterday and she come in today to discuss the results.  MEDICAL HISTORY:  Past Medical History:  Diagnosis Date  . Chills with fever    intermittently since d/c from hospital  . Dysuria-frequency syndrome    w/ pink urine  . GERD (gastroesophageal reflux disease)   . Hepatitis   . History of positive PPD    DX 2011--  CXR DONE NO EVIDENCE  . History of ureter stent   . Hydronephrosis, right   . Neuromuscular disorder (HCC)    legs numb intermittently  . 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[place]   . Pneumonia   . Right ureteral stone   . Urosepsis 8/14   [facility]    SURGICAL HISTORY: Past Surgical History:  Procedure Laterality Date  . CYSTOSCOPY W/ URETERAL STENT PLACEMENT Right 09/25/2012   Procedure: CYSTOSCOPY WITH RETROGRADE PYELOGRAM/URETERAL STENT PLACEMENT;  Surgeon: TAlexis Frock MD;  Location: [facility];  Service: Urology;  Laterality: Right;  . CYSTOSCOPY WITH RETROGRADE PYELOGRAM, URETEROSCOPY AND STENT PLACEMENT Right 10/15/2012   Procedure: CYSTOSCOPY WITH RETROGRADE PYELOGRAM, URETEROSCOPY AND REMOVAL STENT WITH  STENT PLACEMENT;  Surgeon: TAlexis Frock MD;  [facility]  Service: Urology;  Laterality: Right;  . ESOPHAGOGASTRODUODENOSCOPY (EGD) WITH PROPOFOL N/A 11/16/2014   Procedure: ESOPHAGOGASTRODUODENOSCOPY (EGD) WITH PROPOFOL;  Surgeon: DMilus Banister MD;  [facility];  Service: Endoscopy;  Laterality: N/A;  . HOLMIUM LASER APPLICATION Right 93/49/1791  Procedure: HOLMIUM LASER APPLICATION;  Surgeon: TAlexis Frock MD;  [facility]  Service: Urology;  Laterality: Right;  . LIVER BIOPSY    . OTHER SURGICAL HISTORY Right    removal of ovarian cyst  . removal of uterine cyst     years ago  . RIGHT VATS W/ DRAINAGE PEURAL EFFUSION AND BX'S  10-30-2008    SOCIAL HISTORY: Social History   Social History  . Marital Status: Single    Spouse Name: N/A  . Number of Children: 2  . Years of Education: N/A   Occupational History  . 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 Smoking status: Never Smoker   . Smokeless tobacco: Not on file  . Alcohol Use: No  . Drug Use: No  . Sexual Activity: Not Currently    Birth Control/ Protection: Abstinence   Other  Topics Concern  . Not on file   Social History Narrative    FAMILY HISTORY: Family History  Problem Relation Age of Onset  . Stomach cancer Mother   . Lung disease Father   . Asthma Father     ALLERGIES:  has No Known Allergies.  MEDICATIONS:  Current Outpatient Prescriptions  Medication Sig Dispense Refill  . acyclovir (ZOVIRAX) 800 MG tablet Take 1 tablet (800 mg total) by mouth 2 (two) times daily. 60 tablet 2  . docusate sodium (COLACE) 100 MG capsule Take 1 capsule (100 mg total) by mouth 2 (two) times daily. 60 capsule 1  . entecavir (BARACLUDE) 0.5 MG tablet Take 0.5 mg by mouth daily.    Marland Kitchen esomeprazole (NEXIUM) 40 MG capsule Take 1 capsule (40 mg total) by mouth daily at 12 noon. 30 capsule 2  . hydrocortisone (ANUSOL-HC) 2.5 % rectal cream Place 1 application rectally 2 (two) times daily. 30 g 0   . omeprazole (PRILOSEC) 20 MG capsule Take 1 capsule (20 mg total) by mouth daily. 30 capsule 3  . ondansetron (ZOFRAN) 8 MG tablet Take 1 tablet (8 mg total) by mouth every 8 (eight) hours as needed for nausea or vomiting. 30 tablet 1  . polyethylene glycol powder (MIRALAX) powder Take 255 g by mouth daily. Take one capful mixed in water daily. 255 g 0  . potassium chloride SA (K-DUR,KLOR-CON) 20 MEQ tablet Take 1 tablet (20 mEq total) by mouth daily. Take 1 tablet ( 20 meq ) by mouth daily for 7 days;  Then as directed. 20 tablet 0   No current facility-administered medications for this visit.    Facility-Administered Medications Ordered in Other Visits  Medication Dose Route Frequency Provider Last Rate Last Dose  . sodium chloride 0.9 % injection 10 mL  10 mL Intravenous PRN Truitt Merle, MD   10 mL at 12/11/15 1532  . sodium chloride flush (NS) 0.9 % injection 10 mL  10 mL Intravenous PRN Truitt Merle, MD   10 mL at 10/09/15 1717    REVIEW OF SYSTEMS:   Constitutional: fever 2 days ago, resolved, no chills or abnormal night sweats Eyes: Denies blurriness of vision, double vision or watery eyes Ears, nose, mouth, throat, and face: Denies mucositis or sore throat Respiratory: Denies cough, dyspnea or wheezes Cardiovascular: Denies palpitation, chest discomfort or lower extremity swelling Gastrointestinal:  Denies nausea, heartburn or change in bowel habits Skin: Denies abnormal skin rashes Lymphatics: Denies new lymphadenopathy or easy bruising Neurological:Denies numbness, tingling or new weaknesses Behavioral/Psych: Mood is stable, no new changes  All other systems were reviewed with the patient and are negative.  PHYSICAL EXAMINATION: ECOG PERFORMANCE STATUS: 1 Weight 128.4 pounds, blood pressure 103/63, pulse 89, respiratory rate 16, temperature 90.8, pulse ox 98% GENERAL:alert, no distress and comfortable SKIN: skin color, texture, turgor are normal, no rashes or significant  lesions EYES: normal, conjunctiva are pink and non-injected, sclera clear OROPHARYNX:no exudate, no erythema and lips, buccal mucosa NECK: supple, thyroid normal size, non-tender, without nodularity LYMPH:  no palpable lymphadenopathy in the cervical, axillary or inguinal LUNGS: clear to auscultation and percussion with normal breathing effort HEART: regular rate & rhythm and no murmurs and no lower extremity edema ABDOMEN:abdomen soft, non-tender and normal bowel sounds Musculoskeletal:no cyanosis of digits and no clubbing  PSYCH: alert & oriented x 3 with fluent speech NEURO: no focal motor/sensory deficits  LABORATORY DATA:  I have reviewed the data as listed CBC Latest Ref Rng & Units 02/14/2016  02/13/2016 01/16/2016  WBC 4.0 - 10.5 K/uL 9.0 7.3 4.9  Hemoglobin 12.0 - 15.0 g/dL 11.4(L) 10.8(L) 11.0(L)  Hematocrit 36.0 - 46.0 % 34.3(L) 32.5(L) 34.4(L)  Platelets 150 - 400 K/uL 283 272 278    CMP Latest Ref Rng & Units 02/14/2016 02/13/2016 02/13/2016  Glucose 65 - 99 mg/dL 103(H) 82 -  BUN 6 - 20 mg/dL 17 17.3 -  Creatinine 0.44 - 1.00 mg/dL 0.72 0.7 -  Sodium 135 - 145 mmol/L 136 139 -  Potassium 3.5 - 5.1 mmol/L 3.6 4.1 -  Chloride 101 - 111 mmol/L 105 - -  CO2 22 - 32 mmol/L 22 26 -  Calcium 8.9 - 10.3 mg/dL 9.0 9.6 -  Total Protein 6.5 - 8.1 g/dL 8.0 7.8 7.4  Total Bilirubin 0.3 - 1.2 mg/dL 0.7 0.28 -  Alkaline Phos 38 - 126 U/L 98 99 -  AST 15 - 41 U/L 45(H) 23 -  ALT 14 - 54 U/L 17 <6 -    SPEP M-protein  09/15/2014: 4.2 10/08/14: 4.6 12/08/2014: 0.2 02/15/2015: not sufficient sample for test   09/11/2015: not observed 10/09/2015: not det   11/06/2015: not det  12/25/2015: not det  02/13/2016: 0.3  IgG mg/dl 11/03/2014: 3150  12/08/2014:  759 02/14/2014: 860 09/11/2015: 1347 10/09/2015: 1457 11/06/2015: 1504 12/25/2015: 1400 02/13/2016: 1543  Kappa/lambda light chains levels and ration  11/03/14: 0.75, 152, 0.00 12/22/2014: 1.10, 2.60, 0.42 02/15/2015: 9.59,14.35,  0.67 09/11/2015: 2.44, 2.72, 0.90 10/09/2015: 3.09, 3.49, 0.89 11/06/2015: 2.30, 2.62, 0.88 12/25/2015: 2.51, 3.0, 0.84 02/13/2016: 2.64, 1.53, 0.17  24 h urine UPEP/IFE and light chain: 11/03/2014: IFE showed a monoclonal IgG heavy chain with associated lambda light chain. M protein was Undetectable    PATHOLOGY REPORT  Bone Marrow (BM) and Peripheral Blood (PB) FINAL PATHOLOGIC DIAGNOSIS  BONE MARROW: 07/11/2015 Hypocellular marrow (20%) with no increase in plasma cells (2%). See comment.  PERIPHERAL BLOOD: Mild anemia. No circulating plasma cells identified. See comment and CBC data.  COMMENT: The bone marrow core biopsy and clot section are hypocellular for age (20%, 10-40% range). There is no increase in plasma cells, which is confirmed by CD138 immunohistochemistry. Myeloid and erythroid elements are present in normal proportion. Megakaryocytes are morphologically unremarkable. There are no atypical lymphoid aggregates or granulomas.  The bone marrow aspirate smears are adequate for evaluation. Plasma cells are not increased in numbers (2%). There is no increase in blasts (<1%). Myeloid and erythroid elements are present in normal proportion with a M:E ratio of 2.5:1. There is no overt dysplasia of the myeloid, erythroid, or megakaryocytic lineages. There is no lymphocytosis.  Examination of peripheral blood reveals no circulating plasma cells or rouleaux formation. The background leukocytes are morphologically unremarkable and consist mostly of neutrophils.  Immunohistochemical controls worked appropriately.   RADIOGRAPHIC STUDIES: I have personally reviewed the radiological images as listed and agreed with the findings in the report. No results found.  ASSESSMENT & PLAN: 46 year old [place], presented with anemia and leokocytosis   1. Acute plasma cell leukemia, ? 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 Norma Fredrickson recommendation to start maintenance chemotherapy with CyBorD every 2 weeks, for at least 4 months, she has been tolerating well overall -Per Dr. Norma Fredrickson recommendation, we have  changed her maintenance therapy to Velcade 1.3 mg/m every 2 weeks, indefinitely, until disease progression -Her M protein has been undetectable since her transplant, however it became positive again last week, and her IgG level and lambda free light chain level has significantly increased also. This is highly suspicious for recurrent plasma cell leukemia. The total white count is normal. The mild anemia has been stable lately. I discussed the above with patient -I recommend her to have a repeat a bone marrow biopsy this week, I scheduled it was IR for this Friday, January 19. -I will discuss with Dr. Norma Fredrickson about her chemotherapy if bone marrow biopsy confirms relapse disease. -continue acyclovir  -Continue Zometa every 4 weeks for a total of 2 years (until 11/2017)   2. Type 2 diabetes, steroids induced  -She was noticed to have increased blood glucose level lately, with random blood glucose above 200. She also developed diabetic symptoms. No history of diabetes in the past. -This is likely steroids induced hyperglycemia -I strongly recommend her to avoid any soft drink and sweats, and watch her carbohydrate intake, and exercise more  -She has been seen by her primary care physician  -We'll stop her dexamethasone from now on, her blood glucose likely will improve.  3. Chronic Hepatitis B  - She will follow-up GI clinic at [facility] -continue entecavir   4. GERD, recent GI bleeding and nausea  -Continue omeprazole daily. We discussed steroids can worsen her acid reflux. I referred her back  to Dr. Ardis Hughs office, for colonoscopy for rectal bleeding -I reviewed her Zofran today   Plan -IR Bone marrow aspiration and biopsy this Friday, January 19 -Lab, follow-up, and infusion on 02/27/2015 -I will discuss with Dr. Norma Fredrickson -GI referral was made last week, Dr. Ardis Hughs will see her   All questions were answered. The patient knows to call the clinic with any problems, questions or concerns.  I spent 20 minutes counseling the patient face to face. We used the vedio interpreter service. The total time spent in the appointment was 25 minutes and more than 50% was on counseling.     Truitt Merle, MD 02/18/2016

## 2016-02-20 ENCOUNTER — Other Ambulatory Visit: Payer: Self-pay | Admitting: Radiology

## 2016-02-22 ENCOUNTER — Encounter (HOSPITAL_COMMUNITY): Payer: Self-pay

## 2016-02-22 ENCOUNTER — Ambulatory Visit (HOSPITAL_COMMUNITY)
Admission: RE | Admit: 2016-02-22 | Discharge: 2016-02-22 | Disposition: A | Discharge status: Home / Self Care | Payer: BLUE CROSS/BLUE SHIELD | Source: Ambulatory Visit | Attending: Hematology | Admitting: Hematology

## 2016-02-22 ENCOUNTER — Other Ambulatory Visit: Payer: Self-pay | Admitting: Hematology

## 2016-02-22 DIAGNOSIS — Z79899 Other long term (current) drug therapy: Secondary | ICD-10-CM | POA: Diagnosis not present

## 2016-02-22 DIAGNOSIS — R5383 Other fatigue: Secondary | ICD-10-CM | POA: Diagnosis not present

## 2016-02-22 DIAGNOSIS — K219 Gastro-esophageal reflux disease without esophagitis: Secondary | ICD-10-CM | POA: Diagnosis not present

## 2016-02-22 DIAGNOSIS — Z9221 Personal history of antineoplastic chemotherapy: Secondary | ICD-10-CM | POA: Insufficient documentation

## 2016-02-22 DIAGNOSIS — Z825 Family history of asthma and other chronic lower respiratory diseases: Secondary | ICD-10-CM | POA: Insufficient documentation

## 2016-02-22 DIAGNOSIS — R7611 Nonspecific reaction to tuberculin skin test without active tuberculosis: Secondary | ICD-10-CM | POA: Insufficient documentation

## 2016-02-22 DIAGNOSIS — Z836 Family history of other diseases of the respiratory system: Secondary | ICD-10-CM | POA: Diagnosis not present

## 2016-02-22 DIAGNOSIS — C9012 Plasma cell leukemia in relapse: Secondary | ICD-10-CM | POA: Diagnosis present

## 2016-02-22 DIAGNOSIS — Z9889 Other specified postprocedural states: Secondary | ICD-10-CM | POA: Diagnosis not present

## 2016-02-22 DIAGNOSIS — R634 Abnormal weight loss: Secondary | ICD-10-CM | POA: Insufficient documentation

## 2016-02-22 DIAGNOSIS — C9011 Plasma cell leukemia in remission: Secondary | ICD-10-CM

## 2016-02-22 DIAGNOSIS — R109 Unspecified abdominal pain: Secondary | ICD-10-CM | POA: Diagnosis not present

## 2016-02-22 DIAGNOSIS — Z800 Family history of malignant neoplasm of digestive organs: Secondary | ICD-10-CM | POA: Diagnosis not present

## 2016-02-22 DIAGNOSIS — Z87442 Personal history of urinary calculi: Secondary | ICD-10-CM | POA: Insufficient documentation

## 2016-02-22 LAB — CBC WITH DIFFERENTIAL/PLATELET
BASOS ABS: 0 10*3/uL (ref 0.0–0.1)
BASOS PCT: 0 %
Eosinophils Absolute: 0.1 10*3/uL (ref 0.0–0.7)
Eosinophils Relative: 1 %
HEMATOCRIT: 32.3 % — AB (ref 36.0–46.0)
HEMOGLOBIN: 10.4 g/dL — AB (ref 12.0–15.0)
Lymphocytes Relative: 13 %
Lymphs Abs: 1.1 10*3/uL (ref 0.7–4.0)
MCH: 28.3 pg (ref 26.0–34.0)
MCHC: 32.2 g/dL (ref 30.0–36.0)
MCV: 88 fL (ref 78.0–100.0)
MONOS PCT: 9 %
Monocytes Absolute: 0.8 10*3/uL (ref 0.1–1.0)
NEUTROS ABS: 6.4 10*3/uL (ref 1.7–7.7)
NEUTROS PCT: 77 %
Platelets: 284 10*3/uL (ref 150–400)
RBC: 3.67 MIL/uL — AB (ref 3.87–5.11)
RDW: 13 % (ref 11.5–15.5)
WBC: 8.4 10*3/uL (ref 4.0–10.5)

## 2016-02-22 LAB — PROTIME-INR
INR: 0.91
PROTHROMBIN TIME: 12.3 s (ref 11.4–15.2)

## 2016-02-22 LAB — BONE MARROW EXAM

## 2016-02-22 MED ORDER — FENTANYL CITRATE (PF) 100 MCG/2ML IJ SOLN
INTRAMUSCULAR | Status: AC | PRN
  Administered 2016-02-22 (×2): 50 ug via INTRAVENOUS

## 2016-02-22 MED ORDER — SODIUM CHLORIDE 0.9 % IV SOLN
INTRAVENOUS | Status: DC
  Administered 2016-02-22: 08:00:00 via INTRAVENOUS

## 2016-02-22 MED ORDER — FENTANYL CITRATE (PF) 100 MCG/2ML IJ SOLN
INTRAMUSCULAR | Status: AC
  Filled 2016-02-22: qty 4

## 2016-02-22 MED ORDER — HEPARIN SOD (PORK) LOCK FLUSH 100 UNIT/ML IV SOLN
500.0000 [IU] | INTRAVENOUS | Status: AC | PRN
  Administered 2016-02-22: 500 [IU]
  Filled 2016-02-22: qty 5

## 2016-02-22 MED ORDER — ONDANSETRON HCL 4 MG/2ML IJ SOLN
4.0000 mg | Freq: Once | INTRAMUSCULAR | Status: AC
  Administered 2016-02-22: 4 mg via INTRAVENOUS
  Filled 2016-02-22: qty 2

## 2016-02-22 NOTE — Progress Notes (Signed)
Interpreter [phone] from language line used during pre-procedure check in.  Through interpreter patient drank 1 can of Ensure this morning.  Patient unaware of current medications she takes.  States she "takes one for her liver, one for her stomach and an antibiotic".  Does not know the names of her medications.  She did remember the name of Entecavir which she states she took this morning.  Patient also states she has had bone marrow biopsy in the past with only local anesthetic.

## 2016-02-22 NOTE — Consult Note (Signed)
Chief Complaint: Patient was seen in consultation today for  CT guided bone marrow biopsy Referring Physician(s): Feng,Yan  Supervising Physician: Arne Cleveland  Patient Status: [facility] - Out[facility]  History of Present Illness: Sheryl [name] is a 46 y.o. female with history of plasma cell leukemia in 2016 status post chemotherapy and stem cell transplant. She has recently noted increased fatigue, abdominal pain, diminished appetite and weight loss. She also has positive M protein, elevated IgG and lambda free light chains. She presents today for CT-guided bone marrow biopsy to rule out relapse.  Past Medical History:  Diagnosis Date  . Chills with fever    intermittently since d/c from hospital  . Dysuria-frequency syndrome    w/ pink urine  . GERD (gastroesophageal reflux disease)   . Hepatitis   . History of positive PPD    DX 2011--  CXR DONE NO EVIDENCE  . History of ureter stent   . Hydronephrosis, right   . Neuromuscular disorder (HCC)    legs numb intermittently  . 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 Pneumonia   . Right ureteral stone   . Urosepsis 8/14   [facility]    Past Surgical History:  Procedure Laterality Date  . CYSTOSCOPY W/ URETERAL STENT PLACEMENT Right 09/25/2012   Procedure: CYSTOSCOPY WITH RETROGRADE PYELOGRAM/URETERAL STENT PLACEMENT;  Surgeon: Alexis Frock, MD;  Location: [facility];  Service: Urology;  Laterality: Right;  . CYSTOSCOPY WITH RETROGRADE PYELOGRAM, URETEROSCOPY AND STENT PLACEMENT Right 10/15/2012   Procedure: CYSTOSCOPY WITH RETROGRADE PYELOGRAM, URETEROSCOPY AND REMOVAL STENT WITH  STENT PLACEMENT;  Surgeon: Alexis Frock, MD;  [facility];  Service: Urology;  Laterality: Right;  . ESOPHAGOGASTRODUODENOSCOPY (EGD) WITH PROPOFOL N/A 11/16/2014   Procedure: ESOPHAGOGASTRODUODENOSCOPY (EGD) WITH PROPOFOL;  Surgeon: Milus Banister, MD;  [facility];  Service: Endoscopy;  Laterality: N/A;  . HOLMIUM LASER  APPLICATION Right 5/79/0383   Procedure: HOLMIUM LASER APPLICATION;  Surgeon: Alexis Frock, MD;  Location: [facility];  Service: Urology;  Laterality: Right;  . LIVER BIOPSY    . OTHER SURGICAL HISTORY Right    removal of ovarian cyst  . removal of uterine cyst     years ago  . RIGHT VATS W/ DRAINAGE PEURAL EFFUSION AND BX'S  10-30-2008    Allergies: Patient has no known allergies.  Medications: Prior to Admission medications   Medication Sig Start Date End Date Taking? Authorizing Provider  entecavir (BARACLUDE) 0.5 MG tablet Take 0.5 mg by mouth daily.   Yes Historical Provider, MD  acyclovir (ZOVIRAX) 800 MG tablet Take 1 tablet (800 mg total) by mouth 2 (two) times daily. 12/11/15   Truitt Merle, MD  docusate sodium (COLACE) 100 MG capsule Take 1 capsule (100 mg total) by mouth 2 (two) times daily. 12/03/15   Maryanna Shape, NP  esomeprazole (NEXIUM) 40 MG capsule Take 1 capsule (40 mg total) by mouth daily at 12 noon. 11/20/15   Truitt Merle, MD  hydrocortisone (ANUSOL-HC) 2.5 % rectal cream Place 1 application rectally 2 (two) times daily. 12/03/15   Maryanna Shape, NP  omeprazole (PRILOSEC) 20 MG capsule Take 1 capsule (20 mg total) by mouth daily. 12/11/15   Truitt Merle, MD  ondansetron (ZOFRAN) 8 MG tablet Take 1 tablet (8 mg total) by mouth every 8 (eight) hours as needed for nausea or vomiting. 02/18/16   Truitt Merle, MD  polyethylene glycol powder (MIRALAX) powder Take 255 g by mouth daily. Take one capful  mixed in water daily. 12/03/15   Maryanna Shape, NP  potassium chloride SA (K-DUR,KLOR-CON) 20 MEQ tablet Take 1 tablet (20 mEq total) by mouth daily. Take 1 tablet ( 20 meq ) by mouth daily for 7 days;  Then as directed. 09/11/15   Truitt Merle, MD     Family History  Problem Relation Age of Onset  . Stomach cancer Mother   . Lung disease Father   . Asthma Father     Social History   Social History  . Marital status: Single    Spouse name: N/A  . Number of  children: 3  . Years of education: N/A   Social History Main Topics  . Smoking status: Never Smoker  . Smokeless tobacco: Never Used  . Alcohol use No  . Drug use: No  . Sexual activity: Not Currently    Birth control/ protection: Abstinence   Other Topics Concern  . None   Social History Narrative  . None    Review of Systems denies fever, headache, chest pain, dyspnea, cough, abdominal/back pain, nausea, vomiting or abnormal bleeding.  Vital Signs: BP 113/62 (BP Location: Right Arm)   Pulse 100   Temp 97.8 F (36.6 C) (Oral)   Resp 16   SpO2 100%   Physical Exam patient awake, alert. Chest clear to auscultation bilaterally. Clean, intact right chest wall Port-A-Cath. Heart with regular rate and rhythm. Abdomen soft, positive bowel sounds, nontender. Lower extremities- no edema.  Mallampati Score:     Imaging: No results found.  Labs:  CBC:  Recent Labs  01/16/16 1137 02/13/16 1035 02/14/16 1857 02/22/16 0753  WBC 4.9 7.3 9.0 8.4  HGB 11.0* 10.8* 11.4* 10.4*  HCT 34.4* 32.5* 34.3* 32.3*  PLT 278 272 283 284    COAGS:  Recent Labs  05/21/15 0325 02/22/16 0753  INR 1.15 0.91  APTT 49*  --     BMP:  Recent Labs  05/22/15 1055 05/23/15 0536 05/24/15 0535  01/02/16 1042 01/16/16 1137 02/13/16 1035 02/14/16 1859  NA 135 141 141  < > 143 141 139 136  K 2.7* 2.9* 4.1  < > 3.7 4.1 4.1 3.6  CL 107 111 111  --   --   --   --  105  CO2 22 22 21*  < > '26 25 26 22  ' GLUCOSE 110* 122* 96  < > 102 76 82 103*  BUN 6 5* <5*  < > 11.3 15.0 17.3 17  CALCIUM 8.1* 8.6* 8.6*  < > 9.4 9.8 9.6 9.0  CREATININE 0.49 0.77 0.61  < > 0.7 0.7 0.7 0.72  GFRNONAA >60 >60 >60  --   --   --   --  >60  GFRAA >60 >60 >60  --   --   --   --  >60  < > = values in this interval not displayed.  LIVER FUNCTION TESTS:  Recent Labs  01/02/16 1042 01/16/16 1137 02/13/16 1035 02/14/16 1859  BILITOT 0.22 0.25 0.28 0.7  AST '25 29 23 ' 45*  ALT 8 19 <6 17  ALKPHOS 163*  225* 99 98  PROT 7.1 8.1 7.8  7.4 8.0  ALBUMIN 3.2* 3.2* 3.3* 3.8    TUMOR MARKERS: No results for input(s): AFPTM, CEA, CA199, CHROMGRNA in the last 8760 hours.  Assessment and Plan: 46 y.o. female with history of plasma cell leukemia in 2016 status post chemotherapy and stem cell transplant. She has recently noted increased fatigue, abdominal pain,  diminished appetite and weight loss. She also has positive M protein, elevated IgG and lambda free light chains. She presents today for CT-guided bone marrow biopsy to rule out relapse.Risks and benefits discussed with the patient/daughter via interpreter including, but not limited to bleeding, infection, damage to adjacent structures or low yield requiring additional tests.All of the patient's questions were answered, patient is agreeable to proceed. Consent signed and in chart.    Thank you for this interesting consult.  I greatly enjoyed meeting Sabriyah Guggenheim and look forward to participating in their care.  A copy of this report was sent to the requesting provider on this date.  Electronically Signed: D. Rowe Robert 02/22/2016, 8:26 AM   I spent a total of  20 minutes   in face to face in clinical consultation, greater than 50% of which was counseling/coordinating care for CT-guided bone marrow biopsy

## 2016-02-22 NOTE — Procedures (Signed)
CT-guided  R iliac bone marrow aspiration and core biopsy No complication No blood loss. See complete dictation in [facility] PACS  

## 2016-02-22 NOTE — Sedation Documentation (Signed)
Pt was not NPO and will only receive Fentanyl.

## 2016-02-22 NOTE — Discharge Instructions (Signed)
Moderate Conscious Sedation, Adult, Care After These instructions provide you with information about caring for yourself after your procedure. Your health care provider may also give you more specific instructions. Your treatment has been planned according to current medical practices, but problems sometimes occur. Call your health care provider if you have any problems or questions after your procedure. What can I expect after the procedure? After your procedure, it is common:  To feel sleepy for several hours.  To feel clumsy and have poor balance for several hours.  To have poor judgment for several hours.  To vomit if you eat too soon. Follow these instructions at home: For at least 24 hours after the procedure:   Do not:  Participate in activities where you could fall or become injured.  Drive.  Use heavy machinery.  Drink alcohol.  Take sleeping pills or medicines that cause drowsiness.  Make important decisions or sign legal documents.  Take care of children on your own.  Rest. Eating and drinking  Follow the diet recommended by your health care provider.  If you vomit:  Drink water, juice, or soup when you can drink without vomiting.  Make sure you have little or no nausea before eating solid foods. General instructions  Have a responsible adult stay with you until you are awake and alert.  Take over-the-counter and prescription medicines only as told by your health care provider.  If you smoke, do not smoke without supervision.  Keep all follow-up visits as told by your health care provider. This is important. Contact a health care provider if:  You keep feeling nauseous or you keep vomiting.  You feel light-headed.  You develop a rash.  You have a fever. Get help right away if:  You have trouble breathing. This information is not intended to replace advice given to you by your health care provider. Make sure you discuss any questions you have  with your health care provider. Document Released: 11/10/2012 Document Revised: 06/25/2015 Document Reviewed: 05/12/2015 Elsevier Interactive Patient Education  2017 [employer].   Bone Marrow Aspiration and Bone Marrow Biopsy, Adult Bone marrow aspiration and bone marrow biopsy are procedures that are done to diagnose blood disorders. You may also have one of these procedures to help diagnose infections or some types of cancer. Bone marrow is the soft tissue that is inside your bones. Blood cells are produced in bone marrow. For bone marrow aspiration, a sample of tissue in liquid form is removed from inside your bone. For a bone marrow biopsy, a small core of bone marrow tissue is removed. These samples are examined under a microscope or tested in a lab. You may need these procedures if you have an abnormal complete blood count (CBC). The aspiration or biopsy sample is usually taken from the top of your hip bone. Sometimes, an aspiration sample is taken from your chest bone (sternum). Tell a health care provider about:  Any allergies you have.  All medicines you are taking, including vitamins, herbs, eye drops, creams, and over-the-counter medicines.  Any problems you or family members have had with anesthetic medicines.  Any blood or bone disorders you have.  Any surgeries you have had.  Any medical conditions you have.  Whether you are pregnant or you think that you may be pregnant. What are the risks? Generally, this is a safe procedure. However, problems may occur, including:  Infection.  Bleeding.  Persistent pain after the procedure.  Cracking (fracture) of the bone.  Allergic  reactions to medicines. What happens before the procedure? Staying hydrated  Follow instructions from your health care provider about hydration, which may include:  Up to 2 hours before the procedure - you may continue to drink clear liquids, such as water, clear fruit juice, black coffee, and  plain tea. Eating and drinking restrictions  Follow instructions from your health care provider about eating and drinking, which may include:  8 hours before the procedure - stop eating heavy meals or foods such as meat, fried foods, or fatty foods.  6 hours before the procedure - stop eating light meals or foods, such as toast or cereal.  6 hours before the procedure - stop drinking milk or drinks that contain milk.  2 hours before the procedure - stop drinking clear liquids. Medicines  Ask your health care provider about:  Changing or stopping your regular medicines. This is especially important if you are taking diabetes medicines or blood thinners.  Taking medicines such as aspirin and ibuprofen. These medicines can thin your blood. Do not take these medicines before your procedure if your health care provider instructs you not to.  You may be given antibiotic medicine to help prevent infection. General instructions  Plan to have someone take you home after the procedure.  If you will be going home right after the procedure, plan to have someone with you for 24 hours.  Ask your health care provider how your surgical site will be marked or identified. What happens during the procedure?  To reduce your risk of infection:  Your health care team will wash or sanitize their hands.  Your skin will be washed with soap.  Hair may be removed from the surgical area.  An IV tube may be inserted into one of your veins.  The injection site will be cleaned with a germ-killing solution (antiseptic).  You will be given one or more of the following:  A medicine to help you relax (sedative).  A medicine to numb the area (local anesthetic).  A medicine to make you fall asleep (general anesthetic).  The bone marrow sample will be removed as follows:  For an aspiration, a hollow needle will be inserted through your skin and into your bone. Bone marrow fluid will be drawn up into a  syringe.  For a biopsy, your health care provider will use a hollow needle to remove a core of tissue from your bone marrow.  The needle will be removed.  A bandage (dressing) will be placed over the insertion site and taped in place. The procedure may vary among health care providers and hospitals. What happens after the procedure?  Your blood pressure, heart rate, breathing rate, and blood oxygen level will be monitored until the medicines you were given have worn off.  Your IV tube will be removed, and the insertion site will be checked for bleeding.  Do not drive for 24 hours if you were given a sedative. This information is not intended to replace advice given to you by your health care provider. Make sure you discuss any questions you have with your health care provider. Document Released: 01/24/2004 Document Revised: 08/10/2015 Document Reviewed: 07/04/2015 Elsevier Interactive Patient Education  2017 [employer].  Bone Marrow Aspiration and Bone Marrow Biopsy, Adult, Care After This sheet gives you information about how to care for yourself after your procedure. Your health care provider may also give you more specific instructions. If you have problems or questions, contact your health care provider. What can  I expect after the procedure? After the procedure, it is common to have:  Mild pain and tenderness.  Swelling.  Bruising. Follow these instructions at home:  Take over-the-counter or prescription medicines only as told by your health care provider.  Do not take baths, swim, or use a hot tub until your health care provider approves. Ask if you can take a shower or have a sponge bath.  Follow instructions from your health care provider about how to take care of the puncture site. Make sure you:  Wash your hands with soap and water before you change your bandage (dressing). If soap and water are not available, use hand sanitizer.  Change your dressing as told by  your health care provider.  Check your puncture siteevery day for signs of infection. Check for:  More redness, swelling, or pain.  More fluid or blood.  Warmth.  Pus or a bad smell.  Return to your normal activities as told by your health care provider. Ask your health care provider what activities are safe for you.  Do not drive for 24 hours if you were given a medicine to help you relax (sedative).  Keep all follow-up visits as told by your health care provider. This is important. Contact a health care provider if:  You have more redness, swelling, or pain around the puncture site.  You have more fluid or blood coming from the puncture site.  Your puncture site feels warm to the touch.  You have pus or a bad smell coming from the puncture site.  You have a fever.  Your pain is not controlled with medicine. This information is not intended to replace advice given to you by your health care provider. Make sure you discuss any questions you have with your health care provider. Document Released: 08/09/2004 Document Revised: 08/10/2015 Document Reviewed: 07/04/2015 Elsevier Interactive Patient Education  2017 [employer].

## 2016-02-26 NOTE — Progress Notes (Signed)
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      10/10/2014 Miscellaneous    Peripheral blood chemistry and leukocytosis with total white count 78K, comprised of large plasma cells and his normocytic anemia. There is a myeloid left shift with previous surgical radium blasts. Flow cytometry showed 64% plasma cells      10/10/2014 Bone Marrow Biopsy    Markedly hypercellular marrow (95%), Atypical plasma cells comprise 57% of the cellularity. There was diminished multilineage in hematopoiesis with adequate maturation. Breasts less than 1%), no overt dysplasia of the myeloid or erythroid lineages.       10/10/2014 Initial Diagnosis    Plasma cell leukemia      10/13/2014 - 02/22/2015 Chemotherapy    CyborD (cytoxan 346m/m2 iv, bortezomib 1.5 mg/m, dexamethasone 40 mg, weekly every 28 days, bortezomib and dexamethasone was given twice weekly for 2 weeks during the first cycle)      04/04/2015 Bone Marrow Transplant    [facility] was complicated by sepsis from Escherichia coli bacteremia and associated colitis, she was discharged home on 04/27/2015.      05/07/2015 - 05/12/2015 Hospital Admission    [facility], [facility]. ID workup was negative, EGD showed evidence of gastritis and duodenitis, no H. pylori or CMV.      05/20/2015 - 05/24/2015 Hospital Admission    [facility]      07/11/2015 Bone Marrow Biopsy    Post transplant 100 a bone marrow biopsy showed hypocellular marrow, 20%, no increase in plasma cells (2%) or other abnormalities.      08/22/2015 - 01/02/2016 Chemotherapy    MaintenanceCyborD (cytoxan 3060mm2 iv, bortezomib 1.5 mg/m, dexamethasone 40 mg, every 2 weeks, changed to Velcade maintenance after 4 months treatment      01/16/2016 -  Chemotherapy    Maintenance Velcade 1.3 mg/m every 2 weeks       HISTORY OF PRESENTING ILLNESS:  Sheryl [name] [age].o. female is here because of recently diagnosed plasma cell leukemia. [facility] and is here to establish her local oncological care with [place] is accompanied to the clinic by her daughter and interpreter.  She presented to our hospital lth with worsening dyspnea, fatigue, cough, subjective fevers and chills, and was admitted on 09/14/2014. She was found to have allergy WBC 54.1K, hemoglobin 8.1, plt 310, she was treated with symptom management, and was discharged home on 09/17/2014, with a plan to follow-up with hematology. She represents to emergency room on 10/07/2014, was found to have white count of 78K, and worsening anemia with hemoglobin 6. She was seen by my partner Dr. EnJonette Evand plasma cell leukemia was suspected. [facility] Bone marrow biopsy  was done, which confirmed plasma cell leukemia, and she started chemotherapy with Velcade, Cytoxan and dexamethasone. She received weekly dose twice, last dose on 10/20/2014. She was discharged to home afterwards. She had a mediport placed during her hospitalization.  She has moderate fatigue, low appetie, she lost about 13 lbs in the past few months. She has  moderate abdominal pain, 5-6/10, persistent, she does not take any pain meds.   I reviewed her medical records extensively, and discussed her case with Dr. Jorje Guild and [place] program coordinator.  CURRENT THERAPY:  Maintenance Velcade 1.76m/m2 every 2 weeks, started on 01/16/2016, changed back to CyBorD every 2 weeks on 02/27/2016  INTERIM HISTORY: Sheryl [name] returns for follow up, ongoing infusions, and to review bone marrow biopsy results. The bone marrow biopsy procedure went well and she is not experiencing any pain. She reports rectal bleeding today, seeing a lot of blood when she used the bathroom this morning. She reports rectal bleeding continues once in a while. The patient is requesting a refill of entecavir.  MEDICAL HISTORY:  Past Medical History:  Diagnosis Date  . Chills with fever    intermittently since d/c from hospital  . Dysuria-frequency syndrome    w/ pink urine  . GERD (gastroesophageal reflux disease)   . Hepatitis   . History of positive PPD    DX 2011--  CXR DONE NO EVIDENCE  . History of ureter stent   . Hydronephrosis, right   . Neuromuscular disorder (H[facility])    legs numb intermittently  . 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[place]   . Pneumonia   . Right ureteral stone   . Urosepsis 8/14   ad[facility]    SURGICAL HISTORY: Past Surgical History:  Procedure Laterality Date  . CYSTOSCOPY W/ URETERAL STENT PLACEMENT Right 09/25/2012   Procedure: CYSTOSCOPY WITH RETROGRADE PYELOGRAM/URETERAL STENT PLACEMENT;  Surgeon: TAlexis Frock MD;  Location: [facility];  Service: Urology;  Laterality: Right;  . CYSTOSCOPY WITH RETROGRADE PYELOGRAM, URETEROSCOPY AND STENT PLACEMENT Right 10/15/2012   Procedure: CYSTOSCOPY WITH RETROGRADE PYELOGRAM, URETEROSCOPY AND REMOVAL STENT WITH  STENT PLACEMENT;  Surgeon: TAlexis Frock MD;  [facility]  Service: [facility]rology;  Laterality: Right;  . ESOPHAGOGASTRODUODENOSCOPY (EGD) WITH PROPOFOL N/A 11/16/2014    Procedure: ESOPHAGOGASTRODUODENOSCOPY (EGD) WITH PROPOFOL;  Surgeon: DMilus Banister MD;  Location: [facility];  Service: Endoscopy;  Laterality: N/A;  . HOLMIUM LASER APPLICATION Right 99/37/3428  Procedure: HOLMIUM LASER APPLICATION;  Surgeon: TAlexis Frock MD;  [facility]  Service: Urology;  Laterality: Right;  . LIVER BIOPSY    . OTHER SURGICAL HISTORY Right    removal of ovarian cyst  . removal of uterine cyst     years ago  . RIGHT VATS W/ DRAINAGE PEURAL EFFUSION AND BX'S  10-30-2008    SOCIAL HISTORY: Social History   Social History  . Marital status: Single    Spouse name: N/A  . Number of children: 3  . Years of education: N/A   Social History Main Topics  . Smoking status: Never Smoker  . Smokeless tobacco: Never Used  . Alcohol use No  . Drug use: No  . Sexual activity: Not Currently    Birth control/ protection: Abstinence   Other Topics Concern  . None   Social History Narrative  . None    FAMILY HISTORY: Family History  Problem Relation Age of Onset  . Stomach cancer Mother   . Lung disease Father   . Asthma  Father     ALLERGIES:  has No Known Allergies.  MEDICATIONS:  Current Outpatient Prescriptions  Medication Sig Dispense Refill  . acyclovir (ZOVIRAX) 800 MG tablet Take 1 tablet (800 mg total) by mouth 2 (two) times daily. 60 tablet 2  . docusate sodium (COLACE) 100 MG capsule Take 1 capsule (100 mg total) by mouth 2 (two) times daily. 60 capsule 1  . entecavir (BARACLUDE) 0.5 MG tablet Take 0.5 mg by mouth daily.    Marland Kitchen esomeprazole (NEXIUM) 40 MG capsule Take 1 capsule (40 mg total) by mouth daily at 12 noon. 30 capsule 2  . omeprazole (PRILOSEC) 20 MG capsule Take 1 capsule (20 mg total) by mouth daily. 30 capsule 3  . ondansetron (ZOFRAN) 8 MG tablet Take 1 tablet (8 mg total) by mouth every 8 (eight) hours as needed for nausea or vomiting. 30 tablet 1  . polyethylene glycol powder (MIRALAX) powder Take 255 g  by mouth daily. Take one capful mixed in water daily. 255 g 0  . dexamethasone (DECADRON) 4 MG tablet Take 1 tablet (4 mg total) by mouth 2 (two) times daily with a meal. 30 tablet 1  . hydrocortisone (ANUSOL-HC) 2.5 % rectal cream Place 1 application rectally 2 (two) times daily. (Patient not taking: Reported on 02/27/2016) 30 g 0  . metFORMIN (GLUCOPHAGE) 500 MG tablet Take 1 tablet (500 mg total) by mouth 2 (two) times daily with a meal. 60 tablet 1  . potassium chloride SA (K-DUR,KLOR-CON) 20 MEQ tablet Take 1 tablet (20 mEq total) by mouth daily. Take 1 tablet ( 20 meq ) by mouth daily for 7 days;  Then as directed. (Patient not taking: Reported on 02/27/2016) 20 tablet 0   No current facility-administered medications for this visit.    Facility-Administered Medications Ordered in Other Visits  Medication Dose Route Frequency Provider Last Rate Last Dose  . 0.9 %  sodium chloride infusion   Intravenous Once Truitt Merle, MD      . bortezomib SQ (VELCADE) chemo injection 2.5 mg  1.5 mg/m2 (Treatment Plan Recorded) Subcutaneous Once Truitt Merle, MD      . cyclophosphamide (CYTOXAN) 480 mg in sodium chloride 0.9 % 250 mL chemo infusion  300 mg/m2 (Treatment Plan Recorded) Intravenous Once Truitt Merle, MD      . heparin lock flush 100 unit/mL  500 Units Intracatheter Once PRN Truitt Merle, MD      . sodium chloride 0.9 % injection 10 mL  10 mL Intravenous PRN Truitt Merle, MD   10 mL at 12/11/15 1532  . sodium chloride flush (NS) 0.9 % injection 10 mL  10 mL Intravenous PRN Truitt Merle, MD   10 mL at 10/09/15 1717  . sodium chloride flush (NS) 0.9 % injection 10 mL  10 mL Intracatheter PRN Truitt Merle, MD      . Zoledronic Acid (ZOMETA) IVPB 4 mg  4 mg Intravenous Once Truitt Merle, MD   4 mg at 02/27/16 1004    REVIEW OF SYSTEMS:   Constitutional: no chills or abnormal night sweats Eyes: Denies blurriness of vision, double vision or watery eyes Ears, nose, mouth, throat, and face: Denies mucositis or sore  throat Respiratory: Denies cough, dyspnea or wheezes Cardiovascular: Denies palpitation, chest discomfort or lower extremity swelling Gastrointestinal:  Denies nausea, heartburn (+) rectal bleeding Skin: Denies abnormal skin rashes Lymphatics: Denies new lymphadenopathy or easy bruising Neurological:Denies numbness, tingling or new weaknesses Behavioral/Psych: Mood is stable, no new changes  All other systems  were reviewed with the patient and are negative.  PHYSICAL EXAMINATION: ECOG PERFORMANCE STATUS: 1 BP 107/69 (BP Location: Left Arm, Patient Position: Sitting)   Pulse (!) 106   Temp 97.8 F (36.6 C) (Oral)   Resp 18   Ht _0  (1.6 m)   Wt 130 lb 4.8 oz (59.1 kg)   SpO2 100%   BMI 23.08 kg/m  GENERAL:alert, no distress and comfortable SKIN: skin color, texture, turgor are normal, no rashes or significant lesions EYES: normal, conjunctiva are pink and non-injected, sclera clear OROPHARYNX:no exudate, no erythema and lips, buccal mucosa NECK: supple, thyroid normal size, non-tender, without nodularity LYMPH:  no palpable lymphadenopathy in the cervical, axillary or inguinal LUNGS: clear to auscultation and percussion with normal breathing effort HEART: regular rate & rhythm and no murmurs and no lower extremity edema ABDOMEN:abdomen soft, non-tender and normal bowel sounds Musculoskeletal:no cyanosis of digits and no clubbing  PSYCH: alert & oriented x 3 with fluent speech NEURO: no focal motor/sensory deficits  LABORATORY DATA:  I have reviewed the data as listed CBC Latest Ref Rng & Units 02/27/2016 02/22/2016 02/14/2016  WBC 3.9 - 10.3 10e3/uL 6.7 8.4 9.0  Hemoglobin 11.6 - 15.9 g/dL 10.4(L) 10.4(L) 11.4(L)  Hematocrit 34.8 - 46.6 % 33.1(L) 32.3(L) 34.3(L)  Platelets 145 - 400 10e3/uL 266 284 283    CMP Latest Ref Rng & Units 02/27/2016 02/14/2016 02/13/2016  Glucose 70 - 140 mg/dl 109 103(H) 82  BUN 7.0 - 26.0 mg/dL 11.0 17 17.3  Creatinine 0.6 - 1.1 mg/dL 0.7 0.72  0.7  Sodium 136 - 145 mEq/L 140 136 139  Potassium 3.5 - 5.1 mEq/L 4.0 3.6 4.1  Chloride 101 - 111 mmol/L - 105 -  CO2 22 - 29 mEq/L _1 Calcium 8.4 - 10.4 mg/dL 9.4 9.0 9.6  Total Protein 6.4 - 8.3 g/dL 7.7 8.0 7.8  Total Bilirubin 0.20 - 1.20 mg/dL 0.26 0.7 0.28  Alkaline Phos 40 - 150 U/L 81 98 99  AST 5 - 34 U/L 26 45(H) 23  ALT 0-55 U/L U/L <6 17 <6    SPEP M-protein  09/15/2014: 4.2 10/08/14: 4.6 12/08/2014: 0.2 02/15/2015: not sufficient sample for test   09/11/2015: not observed 10/09/2015: not det   11/06/2015: not det  12/25/2015: not det  02/13/2016: 0.3  IgG mg/dl 11/03/2014: 3150  12/08/2014:  759 02/14/2014: 860 09/11/2015: 1347 10/09/2015: 1457 11/06/2015: 1504 12/25/2015: 1400 02/13/2016: 1543  Kappa/lambda light chains levels and ration  11/03/14: 0.75, 152, 0.00 12/22/2014: 1.10, 2.60, 0.42 02/15/2015: 9.59,14.35, 0.67 09/11/2015: 2.44, 2.72, 0.90 10/09/2015: 3.09, 3.49, 0.89 11/06/2015: 2.30, 2.62, 0.88 12/25/2015: 2.51, 3.0, 0.84 02/13/2016: 2.64, 15.3, 0.17  24 h urine UPEP/IFE and light chain: 11/03/2014: IFE showed a monoclonal IgG heavy chain with associated lambda light chain. M protein was Undetectable    PATHOLOGY REPORT  Bone Marrow (BM) and Peripheral Blood (PB) FINAL PATHOLOGIC DIAGNOSIS  BONE MARROW: 07/11/2015 Hypocellular marrow (20%) with no increase in plasma cells (2%). See comment.  PERIPHERAL BLOOD: Mild anemia. No circulating plasma cells identified. See comment and CBC data.  COMMENT: The bone marrow core biopsy and clot section are hypocellular for age (20%, 10-40% range). There is no increase in plasma cells, which is confirmed by CD138 immunohistochemistry. Myeloid and erythroid elements are present in normal proportion. Megakaryocytes are morphologically unremarkable. There are no atypical lymphoid aggregates or granulomas.  The bone marrow aspirate smears are adequate for evaluation. Plasma cells are not increased in numbers  (2%).  There is no increase in blasts (<1%). Myeloid and erythroid elements are present in normal proportion with a M:E ratio of 2.5:1. There is no overt dysplasia of the myeloid, erythroid, or megakaryocytic lineages. There is no lymphocytosis.  Examination of peripheral blood reveals no circulating plasma cells or rouleaux formation. The background leukocytes are morphologically unremarkable and consist mostly of neutrophils.  Immunohistochemical controls worked appropriately.  BONE MARROW: 02/22/2016 Report pending  RADIOGRAPHIC STUDIES: I have personally reviewed the radiological images as listed and agreed with the findings in the report. Ct Biopsy  Result Date: 02/22/2016 CLINICAL DATA:  Leukemia EXAM: CT GUIDED DEEP ILIAC BONE ASPIRATION AND CORE BIOPSY TECHNIQUE: The procedure, risks (including but not limited to bleeding, infection, organ damage ), benefits, and alternatives were explained to the patient. Questions regarding the procedure were encouraged and answered. The patient understands and consents to the procedure. Patient was placed supine on the CT gantry and limited axial scans through the pelvis were obtained. Appropriate skin entry site was identified. Skin site was marked, prepped with Betadine, draped in usual sterile fashion, and infiltrated locally with 1% lidocaine. Intravenous Fentanyl and Versed were administered as conscious sedation during continuous monitoring of the patient's level of consciousness and physiological / cardiorespiratory status by the radiology RN, with a total moderate sedation time of 10 minutes. Under CT fluoroscopic guidance an 11-gauge Cook trocar bone needle was advanced into the right iliac bone just lateral to the sacroiliac joint. Once needle tip position was confirmed, coaxial core and aspiration samples were obtained. The final sample was obtained using the guiding needle itself, which was then removed. Post procedure scans show no hematoma  or fracture. Patient tolerated procedure well. COMPLICATIONS: COMPLICATIONS none IMPRESSION: 1. Technically successful CT guided right iliac bone core and aspiration biopsy. Electronically Signed   By: Lucrezia Europe M.D.   On: 02/22/2016 11:19   Ct Bone Marrow Aspiration  Result Date: 02/22/2016 CLINICAL DATA:  Leukemia EXAM: CT GUIDED DEEP ILIAC BONE ASPIRATION AND CORE BIOPSY TECHNIQUE: The procedure, risks (including but not limited to bleeding, infection, organ damage ), benefits, and alternatives were explained to the patient. Questions regarding the procedure were encouraged and answered. The patient understands and consents to the procedure. Patient was placed supine on the CT gantry and limited axial scans through the pelvis were obtained. Appropriate skin entry site was identified. Skin site was marked, prepped with Betadine, draped in usual sterile fashion, and infiltrated locally with 1% lidocaine. Intravenous Fentanyl and Versed were administered as conscious sedation during continuous monitoring of the patient's level of consciousness and physiological / cardiorespiratory status by the radiology RN, with a total moderate sedation time of 10 minutes. Under CT fluoroscopic guidance an 11-gauge Cook trocar bone needle was advanced into the right iliac bone just lateral to the sacroiliac joint. Once needle tip position was confirmed, coaxial core and aspiration samples were obtained. The final sample was obtained using the guiding needle itself, which was then removed. Post procedure scans show no hematoma or fracture. Patient tolerated procedure well. COMPLICATIONS: COMPLICATIONS none IMPRESSION: 1. Technically successful CT guided right iliac bone core and aspiration biopsy. Electronically Signed   By: Lucrezia Europe M.D.   On: 02/22/2016 11:19    ASSESSMENT & PLAN: 46 y.o. [place], presented with anemia and leokocytosis   1. 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 Norma Fredrickson recommendation to start maintenance chemotherapy with CyBorD every 2 weeks, for at least 4 months, she has been tolerating well overall, this was changed to maintenance therapy to Velcade 1.3 mg/m every 2 weeks in 01/2016, indefinitely, until disease progression -Her M protein has been undetectable since her transplant, however it became positive again last week, and her IgG level and lambda free light chain level has significantly increased also. This is highly suspicious for recurrent plasma cell leukemia. The total white count is normal. The mild anemia has been stable lately. I discussed the above with patient -We discussed bone marrow biopsy results from 02/22/2016. Unfortunately this showed increased plasma cells 12% with additional lambda light chain staining pending. This is likely relapse of disease.  -I recommend her to restart CyBorD every 2 weeks (discussed with Dr. Norma Fredrickson last week), starting today, due to her relapsed disease. If she does not have good response to this regimen, which ended her to Dr. tumor bed, Velcade and dexamethasone -continue acyclovir  -Continue Zometa every 4 weeks for a total of 2 years (until 11/2017)  2. Type 2 diabetes, steroids induced  -She was noticed to have increased blood glucose level lately, with random blood glucose above 200. She also developed diabetic symptoms. No history of diabetes in the past. -This is likely steroids induced hyperglycemia -I strongly recommend her to avoid any soft drink and sweats, and watch her carbohydrate intake, and exercise more  -She has been seen by her primary care physician. She was not given any medication to manage her glucose. I  will call her primary care physician to discuss adding metformin. -She is going to restart her dexamethasone as part of his leukemia treatment, I recommend her to take metformin 500 mg twice daily for 5 days after chemotherapy, to control her hyperglycemia. Potential side effects discussed, she agrees to proceed.  3. Chronic Hepatitis B  - She will follow-up GI clinic at [facility] -continue entecavir. The patient is requesting a refill of entecavir. Our nursing staff will call her physician's office to request this for her.  4. GERD, recent GI bleeding and nausea  -Continue omeprazole daily. We discussed steroids can worsen her acid reflux. -She had an EGD at [facility] in 2017. There is no record of a colonoscopy at that time.  -I referred her back to Dr. Ardis Hughs office, for colonoscopy for rectal bleeding. She is scheduled to see Dr. Ardis Hughs on 03/04/2016.   Plan -Lab, follow-up, and infusion on 03/12/2016 -Bone marrow biopsy shows likely relapse of disease, we will restart chemotherapy (Velcade, dexa plus cytoxan) today and continue every 2 weeks -I cautery metformin 500 mg twice daily, she will take for 5 days after chemotherapy. -She will see [facility]. Tiana Loft at [facility] on 04/07/2016 -She is scheduled to see Dr. Ardis Hughs in GI on 03/04/2016, she knows to call Dr. Ardis Hughs office if her rectal bleeding gets worse this week.  All questions were answered. The patient knows to call the clinic with any problems, questions or concerns.  I spent 20 minutes counseling the patient face to face. We used the vedio interpreter service. The total time spent in the appointment was 25 minutes and more than 50% was on counseling.   This document serves as a record of services personally performed by Truitt Merle, MD. It was created on her  behalf by Arlyce Harman, a trained medical scribe. The creation of this record is based on the scribe's personal observations and the provider's statements to them. This document  has been checked and approved by the attending provider.   Truitt Merle, MD 02/27/2016

## 2016-02-27 ENCOUNTER — Other Ambulatory Visit (HOSPITAL_BASED_OUTPATIENT_CLINIC_OR_DEPARTMENT_OTHER): Payer: BLUE CROSS/BLUE SHIELD

## 2016-02-27 ENCOUNTER — Ambulatory Visit (HOSPITAL_BASED_OUTPATIENT_CLINIC_OR_DEPARTMENT_OTHER): Payer: BLUE CROSS/BLUE SHIELD | Admitting: Hematology

## 2016-02-27 ENCOUNTER — Ambulatory Visit: Payer: BLUE CROSS/BLUE SHIELD

## 2016-02-27 ENCOUNTER — Other Ambulatory Visit: Payer: BLUE CROSS/BLUE SHIELD

## 2016-02-27 ENCOUNTER — Encounter: Payer: Self-pay | Admitting: Hematology

## 2016-02-27 ENCOUNTER — Ambulatory Visit (HOSPITAL_BASED_OUTPATIENT_CLINIC_OR_DEPARTMENT_OTHER): Payer: BLUE CROSS/BLUE SHIELD

## 2016-02-27 VITALS — BP 107/69 | HR 106 | Temp 97.8°F | Resp 18 | Ht 63.0 in | Wt 130.3 lb

## 2016-02-27 VITALS — HR 91

## 2016-02-27 DIAGNOSIS — C9012 Plasma cell leukemia in relapse: Secondary | ICD-10-CM

## 2016-02-27 DIAGNOSIS — Z5112 Encounter for antineoplastic immunotherapy: Secondary | ICD-10-CM | POA: Diagnosis not present

## 2016-02-27 DIAGNOSIS — B181 Chronic viral hepatitis B without delta-agent: Secondary | ICD-10-CM | POA: Diagnosis not present

## 2016-02-27 DIAGNOSIS — T380X5A Adverse effect of glucocorticoids and synthetic analogues, initial encounter: Secondary | ICD-10-CM

## 2016-02-27 DIAGNOSIS — K922 Gastrointestinal hemorrhage, unspecified: Secondary | ICD-10-CM

## 2016-02-27 DIAGNOSIS — K219 Gastro-esophageal reflux disease without esophagitis: Secondary | ICD-10-CM

## 2016-02-27 DIAGNOSIS — E099 Drug or chemical induced diabetes mellitus without complications: Secondary | ICD-10-CM

## 2016-02-27 DIAGNOSIS — D649 Anemia, unspecified: Secondary | ICD-10-CM

## 2016-02-27 DIAGNOSIS — C9010 Plasma cell leukemia not having achieved remission: Secondary | ICD-10-CM

## 2016-02-27 DIAGNOSIS — R110 Nausea: Secondary | ICD-10-CM

## 2016-02-27 DIAGNOSIS — Z95828 Presence of other vascular implants and grafts: Secondary | ICD-10-CM

## 2016-02-27 DIAGNOSIS — C9011 Plasma cell leukemia in remission: Secondary | ICD-10-CM

## 2016-02-27 LAB — COMPREHENSIVE METABOLIC PANEL
ALBUMIN: 3.1 g/dL — AB (ref 3.5–5.0)
ALK PHOS: 81 U/L (ref 40–150)
ALT: <6 U/L (ref 0–55)
AST: 26 U/L (ref 5–34)
Anion Gap: 9 mEq/L (ref 3–11)
BILIRUBIN TOTAL: 0.26 mg/dL (ref 0.20–1.20)
BUN: 11 mg/dL (ref 7.0–26.0)
CO2: 24 mEq/L (ref 22–29)
CREATININE: 0.7 mg/dL (ref 0.6–1.1)
Calcium: 9.4 mg/dL (ref 8.4–10.4)
Chloride: 107 mEq/L (ref 98–109)
EGFR: >90 mL/min/{1.73_m2} (ref >90)
GLUCOSE: 109 mg/dL (ref 70–140)
Potassium: 4 mEq/L (ref 3.5–5.1)
SODIUM: 140 meq/L (ref 136–145)
TOTAL PROTEIN: 7.7 g/dL (ref 6.4–8.3)

## 2016-02-27 LAB — CBC WITH DIFFERENTIAL/PLATELET
BASO%: 0.2 % (ref 0.0–2.0)
Basophils Absolute: 0 10*3/uL (ref 0.0–0.1)
EOS ABS: 0.1 10*3/uL (ref 0.0–0.5)
EOS%: 1.8 % (ref 0.0–7.0)
HCT: 33.1 % — ABNORMAL LOW (ref 34.8–46.6)
HEMOGLOBIN: 10.4 g/dL — AB (ref 11.6–15.9)
LYMPH%: 15.6 % (ref 14.0–49.7)
MCH: 27.8 pg (ref 25.1–34.0)
MCHC: 31.4 g/dL — ABNORMAL LOW (ref 31.5–36.0)
MCV: 88.5 fL (ref 79.5–101.0)
MONO#: 0.7 10*3/uL (ref 0.1–0.9)
MONO%: 11 % (ref 0.0–14.0)
NEUT#: 4.8 10*3/uL (ref 1.5–6.5)
NEUT%: 71.4 % (ref 38.4–76.8)
Platelets: 266 10*3/uL (ref 145–400)
RBC: 3.74 10*6/uL (ref 3.70–5.45)
RDW: 13 % (ref 11.2–14.5)
WBC: 6.7 10*3/uL (ref 3.9–10.3)
lymph#: 1 10*3/uL (ref 0.9–3.3)

## 2016-02-27 MED ORDER — SODIUM CHLORIDE 0.9% FLUSH
10.0000 mL | INTRAVENOUS | Status: DC | PRN
  Administered 2016-02-27: 10 mL
  Filled 2016-02-27: qty 10

## 2016-02-27 MED ORDER — METFORMIN HCL 500 MG PO TABS: 500.0000 mg | ORAL_TABLET | Freq: Two times a day (BID) | ORAL | 1 refills | Status: DC

## 2016-02-27 MED ORDER — DEXAMETHASONE SODIUM PHOSPHATE 10 MG/ML IJ SOLN
INTRAMUSCULAR | Status: AC
  Filled 2016-02-27: qty 1

## 2016-02-27 MED ORDER — SODIUM CHLORIDE 0.9 % IV SOLN: 10.0000 mg | Freq: Once | INTRAVENOUS | Status: DC

## 2016-02-27 MED ORDER — SODIUM CHLORIDE 0.9 % IV SOLN
300.0000 mg/m2 | Freq: Once | INTRAVENOUS | Status: AC
  Administered 2016-02-27: 480 mg via INTRAVENOUS
  Filled 2016-02-27: qty 24

## 2016-02-27 MED ORDER — PALONOSETRON HCL INJECTION 0.25 MG/5ML
INTRAVENOUS | Status: AC
  Filled 2016-02-27: qty 5

## 2016-02-27 MED ORDER — HEPARIN SOD (PORK) LOCK FLUSH 100 UNIT/ML IV SOLN
500.0000 [IU] | Freq: Once | INTRAVENOUS | Status: AC | PRN
  Administered 2016-02-27: 500 [IU]
  Filled 2016-02-27: qty 5

## 2016-02-27 MED ORDER — DEXAMETHASONE 4 MG PO TABS: 4.0000 mg | ORAL_TABLET | Freq: Two times a day (BID) | ORAL | 1 refills | Status: DC

## 2016-02-27 MED ORDER — ZOLEDRONIC ACID 4 MG/100ML IV SOLN
4.0000 mg | Freq: Once | INTRAVENOUS | Status: AC
  Administered 2016-02-27: 4 mg via INTRAVENOUS
  Filled 2016-02-27: qty 100

## 2016-02-27 MED ORDER — ONDANSETRON HCL 4 MG/2ML IJ SOLN
8.0000 mg | Freq: Once | INTRAMUSCULAR | Status: AC
  Administered 2016-02-27: 8 mg via INTRAVENOUS

## 2016-02-27 MED ORDER — SODIUM CHLORIDE 0.9 % IV SOLN
Freq: Once | INTRAVENOUS | Status: AC
  Administered 2016-02-27: 10:00:00 via INTRAVENOUS

## 2016-02-27 MED ORDER — ONDANSETRON HCL 4 MG/2ML IJ SOLN
INTRAMUSCULAR | Status: AC
  Filled 2016-02-27: qty 2

## 2016-02-27 MED ORDER — PALONOSETRON HCL INJECTION 0.25 MG/5ML: 0.2500 mg | Freq: Once | INTRAVENOUS | Status: DC

## 2016-02-27 MED ORDER — DEXAMETHASONE SODIUM PHOSPHATE 10 MG/ML IJ SOLN
10.0000 mg | Freq: Once | INTRAMUSCULAR | Status: AC
  Administered 2016-02-27: 10 mg via INTRAVENOUS

## 2016-02-27 MED ORDER — BORTEZOMIB CHEMO SQ INJECTION 3.5 MG (2.5MG/ML)
1.5000 mg/m2 | Freq: Once | INTRAMUSCULAR | Status: AC
  Administered 2016-02-27: 2.5 mg via SUBCUTANEOUS
  Filled 2016-02-27: qty 2.5

## 2016-02-27 NOTE — Patient Instructions (Signed)
Implanted Port Insertion, Care After Refer to this sheet in the next few weeks. These instructions provide you with information on caring for yourself after your procedure. Your health care provider may also give you more specific instructions. Your treatment has been planned according to current medical practices, but problems sometimes occur. Call your health care provider if you have any problems or questions after your procedure. WHAT TO EXPECT AFTER THE PROCEDURE After your procedure, it is typical to have the following:   Discomfort at the port insertion site. Ice packs to the area will help.  Bruising on the skin over the port. This will subside in 3-4 days. HOME CARE INSTRUCTIONS  After your port is placed, you will get a manufacturer's information card. The card has information about your port. Keep this card with you at all times.   Know what kind of port you have. There are many types of ports available.   Wear a medical alert bracelet in case of an emergency. This can help alert health care workers that you have a port.   The port can stay in for as long as your health care provider believes it is necessary.   A home health care nurse may give medicines and take care of the port.   You or a family member can get special training and directions for giving medicine and taking care of the port at home.  SEEK MEDICAL CARE IF:   Your port does not flush or you are unable to get a blood return.   You have a fever or chills. SEEK IMMEDIATE MEDICAL CARE IF:  You have new fluid or pus coming from your incision.   You notice a bad smell coming from your incision site.   You have swelling, pain, or more redness at the incision or port site.   You have chest pain or shortness of breath. This information is not intended to replace advice given to you by your health care provider. Make sure you discuss any questions you have with your health care provider. Document  Released: 11/10/2012 Document Revised: 01/25/2013 Document Reviewed: 11/10/2012 Elsevier Interactive Patient Education  2017 [employer]  

## 2016-02-27 NOTE — Patient Instructions (Signed)
[facility] Discharge Instructions for Patients Receiving Chemotherapy  Today you received the following chemotherapy agents Cytoxan/Velcade To help prevent nausea and vomiting after your treatment, we encourage you to take your nausea medication as prescribed.  If you develop nausea and vomiting that is not controlled by your nausea medication, call the clinic.   BELOW ARE SYMPTOMS THAT SHOULD BE REPORTED IMMEDIATELY:  *FEVER GREATER THAN 100.5 F  *CHILLS WITH OR WITHOUT FEVER  NAUSEA AND VOMITING THAT IS NOT CONTROLLED WITH YOUR NAUSEA MEDICATION  *UNUSUAL SHORTNESS OF BREATH  *UNUSUAL BRUISING OR BLEEDING  TENDERNESS IN MOUTH AND THROAT WITH OR WITHOUT PRESENCE OF ULCERS  *URINARY PROBLEMS  *BOWEL PROBLEMS  UNUSUAL RASH Items with * indicate a potential emergency and should be followed up as soon as possible.  Feel free to call the clinic you have any questions or concerns. The clinic phone number is [phone].  Please show the [place] at check-in to the Emergency Department and triage nurse.  Zoledronic Acid injection (Hypercalcemia, Oncology) (Zometa) What is this medicine? ZOLEDRONIC ACID (ZOE le dron ik AS id) lowers the amount of calcium loss from bone. It is used to treat too much calcium in your blood from cancer. It is also used to prevent complications of cancer that has spread to the bone. This medicine may be used for other purposes; ask your health care provider or pharmacist if you have questions. COMMON BRAND NAME(S): Zometa What should I tell my health care provider before I take this medicine? They need to know if you have any of these conditions: -aspirin-sensitive asthma -cancer, especially if you are receiving medicines used to treat cancer -dental disease or wear dentures -infection -kidney disease -receiving corticosteroids like dexamethasone or prednisone -an unusual or allergic reaction to zoledronic acid, other  medicines, foods, dyes, or preservatives -pregnant or trying to get pregnant -breast-feeding How should I use this medicine? This medicine is for infusion into a vein. It is given by a health care professional in a hospital or clinic setting. Talk to your pediatrician regarding the use of this medicine in children. Special care may be needed. Overdosage: If you think you have taken too much of this medicine contact a poison control center or emergency room at once. NOTE: This medicine is only for you. Do not share this medicine with others. What if I miss a dose? It is important not to miss your dose. Call your doctor or health care professional if you are unable to keep an appointment. What may interact with this medicine? -certain antibiotics given by injection -NSAIDs, medicines for pain and inflammation, like ibuprofen or naproxen -some diuretics like bumetanide, furosemide -teriparatide -thalidomide This list may not describe all possible interactions. Give your health care provider a list of all the medicines, herbs, non-prescription drugs, or dietary supplements you use. Also tell them if you smoke, drink alcohol, or use illegal drugs. Some items may interact with your medicine. What should I watch for while using this medicine? Visit your doctor or health care professional for regular checkups. It may be some time before you see the benefit from this medicine. Do not stop taking your medicine unless your doctor tells you to. Your doctor may order blood tests or other tests to see how you are doing. Women should inform their doctor if they wish to become pregnant or think they might be pregnant. There is a potential for serious side effects to an unborn child. Talk to your health  care professional or pharmacist for more information. You should make sure that you get enough calcium and vitamin D while you are taking this medicine. Discuss the foods you eat and the vitamins you take with  your health care professional. Some people who take this medicine have severe bone, joint, and/or muscle pain. This medicine may also increase your risk for jaw problems or a broken thigh bone. Tell your doctor right away if you have severe pain in your jaw, bones, joints, or muscles. Tell your doctor if you have any pain that does not go away or that gets worse. Tell your dentist and dental surgeon that you are taking this medicine. You should not have major dental surgery while on this medicine. See your dentist to have a dental exam and fix any dental problems before starting this medicine. Take good care of your teeth while on this medicine. Make sure you see your dentist for regular follow-up appointments. What side effects may I notice from receiving this medicine? Side effects that you should report to your doctor or health care professional as soon as possible: -allergic reactions like skin rash, itching or hives, swelling of the face, lips, or tongue -anxiety, confusion, or depression -breathing problems -changes in vision -eye pain -feeling faint or lightheaded, falls -jaw pain, especially after dental work -mouth sores -muscle cramps, stiffness, or weakness -redness, blistering, peeling or loosening of the skin, including inside the mouth -trouble passing urine or change in the amount of urine Side effects that usually do not require medical attention (report to your doctor or health care professional if they continue or are bothersome): -bone, joint, or muscle pain -constipation -diarrhea -fever -hair loss -irritation at site where injected -loss of appetite -nausea, vomiting -stomach upset -trouble sleeping -trouble swallowing -weak or tired This list may not describe all possible side effects. Call your doctor for medical advice about side effects. You may report side effects to FDA at 1-800-FDA-1088. Where should I keep my medicine? This drug is given in a hospital or  clinic and will not be stored at home. NOTE: This sheet is a summary. It may not cover all possible information. If you have questions about this medicine, talk to your doctor, pharmacist, or health care provider.  2017 Elsevier/Gold Standard (2013-06-18 14:19:39)

## 2016-03-04 ENCOUNTER — Encounter: Payer: Self-pay | Admitting: Gastroenterology

## 2016-03-04 ENCOUNTER — Ambulatory Visit (INDEPENDENT_AMBULATORY_CARE_PROVIDER_SITE_OTHER): Payer: BLUE CROSS/BLUE SHIELD | Admitting: Gastroenterology

## 2016-03-04 VITALS — BP 90/66 | HR 104 | Ht 63.0 in | Wt 130.1 lb

## 2016-03-04 DIAGNOSIS — K625 Hemorrhage of anus and rectum: Secondary | ICD-10-CM | POA: Diagnosis not present

## 2016-03-04 DIAGNOSIS — B181 Chronic viral hepatitis B without delta-agent: Secondary | ICD-10-CM

## 2016-03-04 MED ORDER — NA SULFATE-K SULFATE-MG SULF 17.5-3.13-1.6 GM/177ML PO SOLN: 1.0000 | Freq: Once | ORAL | 0 refills | Status: AC

## 2016-03-04 NOTE — Patient Instructions (Signed)
You will be set up for a colonoscopy for rectal bleeding.

## 2016-03-04 NOTE — Progress Notes (Signed)
HPI: This is a  very pleasant 46 year old woman  who was referred to me by dr. Truitt Merle to evaluate rectal bleeding    Chief complaint is red rectal bleeding  Her daughter is with her and helps with translating since she is primarily [place] speaker  She has had intermittent red rectal bleeding for the past 2-3 months. This is not with every bowel movement but with most. She does not have any anal pain or any significant abdominal pain. She has not had any significant changes in her bowels. Specificallysignificant constipation or diarrhea. Her weight has been overall stable. She has never had bleeding like this before. Colon cancer does not run in her family. She has never had colonoscopy.  Old Data Reviewed:  CBC last week showed hemoglobin was 10.4; WBC was normal.   She see's GI/Hepatology at [facility] for her hepatitis B that I can tell.  Just got refills fro entecavir from them and is planning to see them again in 1-2 weeks in the office (Dr. Lolita Lenz)     Review of systems: Pertinent positive and negative review of systems were noted in the above HPI section. Complete review of systems was performed and was otherwise normal.   Past Medical History:  Diagnosis Date  . Chills with fever    intermittently since d/c from hospital  . Dysuria-frequency syndrome    w/ pink urine  . GERD (gastroesophageal reflux disease)   . Hepatitis   . History of positive PPD    DX 2011--  CXR DONE NO EVIDENCE  . History of ureter stent   . Hydronephrosis, right   . Neuromuscular disorder (HCC)    legs numb intermittently  . 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 Pneumonia   . Right ureteral stone   . Urosepsis 8/14   admitted to [facility]    Past Surgical History:  Procedure Laterality Date  . CYSTOSCOPY W/ URETERAL STENT PLACEMENT Right 09/25/2012   Procedure: CYSTOSCOPY WITH RETROGRADE PYELOGRAM/URETERAL STENT PLACEMENT;  Surgeon: Alexis Frock, MD;  Location: [facility];  Service: Urology;   Laterality: Right;  . CYSTOSCOPY WITH RETROGRADE PYELOGRAM, URETEROSCOPY AND STENT PLACEMENT Right 10/15/2012   Procedure: CYSTOSCOPY WITH RETROGRADE PYELOGRAM, URETEROSCOPY AND REMOVAL STENT WITH  STENT PLACEMENT;  Surgeon: Alexis Frock, MD;  Location: [facility];  Service: Urology;  Laterality: Right;  . ESOPHAGOGASTRODUODENOSCOPY (EGD) WITH PROPOFOL N/A 11/16/2014   Procedure: ESOPHAGOGASTRODUODENOSCOPY (EGD) WITH PROPOFOL;  Surgeon: Milus Banister, MD;  Location: [facility];  Service: Endoscopy;  Laterality: N/A;  . HOLMIUM LASER APPLICATION Right [id number]   Procedure: HOLMIUM LASER APPLICATION;  Surgeon: Alexis Frock, MD;  Location: [facility];  Service: Urology;  Laterality: Right;  . LIVER BIOPSY    . OTHER SURGICAL HISTORY Right    removal of ovarian cyst  . removal of uterine cyst     years ago  . RIGHT VATS W/ DRAINAGE PEURAL EFFUSION AND BX'S  10-30-2008    Current Outpatient Prescriptions  Medication Sig Dispense Refill  . acyclovir (ZOVIRAX) 800 MG tablet Take 1 tablet (800 mg total) by mouth 2 (two) times daily. 60 tablet 2  . dexamethasone (DECADRON) 4 MG tablet Take 1 tablet (4 mg total) by mouth 2 (two) times daily with a meal. 30 tablet 1  . docusate sodium (COLACE) 100 MG capsule Take 1 capsule (100 mg total) by mouth 2 (two) times daily. 60 capsule 1  . entecavir (BARACLUDE) 0.5 MG tablet Take 0.5 mg by mouth  daily.    . esomeprazole (NEXIUM) 40 MG capsule Take 1 capsule (40 mg total) by mouth daily at 12 noon. 30 capsule 2  . hydrocortisone (ANUSOL-HC) 2.5 % rectal cream Place 1 application rectally 2 (two) times daily. 30 g 0  . metFORMIN (GLUCOPHAGE) 500 MG tablet Take 1 tablet (500 mg total) by mouth 2 (two) times daily with a meal. 60 tablet 1  . omeprazole (PRILOSEC) 20 MG capsule Take 1 capsule (20 mg total) by mouth daily. 30 capsule 3  . ondansetron (ZOFRAN) 8 MG tablet Take 1 tablet (8 mg total) by mouth every 8 (eight)  hours as needed for nausea or vomiting. 30 tablet 1  . polyethylene glycol powder (MIRALAX) powder Take 255 g by mouth daily. Take one capful mixed in water daily. 255 g 0  . potassium chloride SA (K-DUR,KLOR-CON) 20 MEQ tablet Take 1 tablet (20 mEq total) by mouth daily. Take 1 tablet ( 20 meq ) by mouth daily for 7 days;  Then as directed. 20 tablet 0   No current facility-administered medications for this visit.    Facility-Administered Medications Ordered in Other Visits  Medication Dose Route Frequency Provider Last Rate Last Dose  . sodium chloride 0.9 % injection 10 mL  10 mL Intravenous PRN Truitt Merle, MD   10 mL at 12/11/15 1532  . sodium chloride flush (NS) 0.9 % injection 10 mL  10 mL Intravenous PRN Truitt Merle, MD   10 mL at 10/09/15 1717    Allergies as of 03/04/2016  . (No Known Allergies)    Family History  Problem Relation Age of Onset  . Stomach cancer Mother   . Lung disease Father   . Asthma Father     Social History   Social History  . Marital status: Single    Spouse name: N/A  . Number of children: 3  . Years of education: N/A   Occupational History  . Not on file.   Social History Main Topics  . Smoking status: Never Smoker  . Smokeless tobacco: Never Used  . Alcohol use No  . Drug use: No  . Sexual activity: Not Currently    Birth control/ protection: Abstinence   Other Topics Concern  . Not on file   Social History Narrative  . No narrative on file     Physical Exam: BP 90/66   Pulse (!) 104   Ht 5\' 3"  (1.6 m)   Wt 130 lb 2 oz (59 kg)   BMI 23.05 kg/m  Constitutional: generally well-appearing Psychiatric: alert and oriented x3 Eyes: extraocular movements intact Mouth: oral pharynx moist, no lesions Neck: supple no lymphadenopathy Cardiovascular: heart regular rate and rhythm Lungs: clear to auscultation bilaterally Abdomen: soft, nontender, nondistended, no obvious ascites, no peritoneal signs, normal bowel sounds Extremities: no  lower extremity edema bilaterally Skin: no lesions on visible extremities Rectal exam deferred for upcoming colonoscopy  Assessment and plan: 46 y.o. female with  red rectal bleeding for 2-3 months  This seems anal rectal in origin, perhaps hemorrhoidal. She has never had colon cancer screening that I can tell and I recommended we proceed with full colonoscopy at her soonest convenience. I see no reason for any further blood tests or imaging studies prior to then.  Chronic hepatitis B, under care at [facility], she will continue to see them for her Hep B, liver disease.   Please see the "Patient Instructions" section for addition details about the plan.   Owens Loffler, MD  [facility] 03/04/2016, 2:38 PM  Cc: [name], MD

## 2016-03-05 LAB — CHROMOSOME ANALYSIS, BONE MARROW

## 2016-03-05 LAB — TISSUE HYBRIDIZATION (BONE MARROW)-NCBH

## 2016-03-10 NOTE — Progress Notes (Signed)
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      10/10/2014 Miscellaneous    Peripheral blood chemistry and leukocytosis with total white count 78K, comprised of large plasma cells and his normocytic anemia. There is a myeloid left shift with previous surgical radium blasts. Flow cytometry showed 64% plasma cells      10/10/2014 Bone Marrow Biopsy    Markedly hypercellular marrow (95%), Atypical plasma cells comprise 57% of the cellularity. There was diminished multilineage in hematopoiesis with adequate maturation. Breasts less than 1%), no overt dysplasia of the myeloid or erythroid lineages.       10/10/2014 Initial Diagnosis    Plasma cell leukemia      10/13/2014 - 02/22/2015 Chemotherapy    CyborD (cytoxan 389m/m2 iv, bortezomib 1.5 mg/m, dexamethasone 40 mg, weekly every 28 days, bortezomib and dexamethasone was given twice weekly for 2 weeks during the first cycle)      04/04/2015 Bone Marrow Transplant    [facility] course was complicated by sepsis from Escherichia coli bacteremia and associated colitis, she was discharged home on 04/27/2015.      05/07/2015 - 05/12/2015 Hospital Admission    [facility], [facility] ID workup was negative, EGD showed evidence of gastritis and duodenitis, no H. pylori or CMV.      05/20/2015 - 05/24/2015 Hospital Admission    [facility]      07/11/2015 Bone Marrow Biopsy    Post transplant 100 a bone marrow biopsy showed hypocellular marrow, 20%, no increase in plasma cells (2%) or other abnormalities.      08/22/2015 - 01/02/2016 Chemotherapy    MaintenanceCyborD (cytoxan 3071mm2 iv, bortezomib 1.5 mg/m, dexamethasone 40 mg, every 2 weeks, changed to Velcade maintenance after 4 months treatment      01/16/2016 -  Chemotherapy    Maintenance Velcade 1.3 mg/m every 2 weeks       HISTORY OF PRESENTING ILLNESS:  Sheryl [name] [age].o. female is here because of recently diagnosed plasma cell leukemia. [facility] and is here to establish her local oncological care with [place] is accompanied to the clinic by her daughter and interpreter.  She presented to our hospital lth with worsening dyspnea, fatigue, cough, subjective fevers and chills, and was admitted on 09/14/2014. She was found to have allergy WBC 54.1K, hemoglobin 8.1, plt 310, she was treated with symptom management, and was discharged home on 09/17/2014, with a plan to follow-up with hematology. She represents to emergency room on 10/07/2014, was found to have white count of 78K, and worsening anemia with hemoglobin 6. She was seen by my partner Dr. EnJonette Evand plasma cell leukemia was suspected. [facility] Bone marrow biopsy  was done, which confirmed plasma cell leukemia, and she started chemotherapy with Velcade, Cytoxan and dexamethasone. She received weekly dose twice, last dose on 10/20/2014. She was discharged to home afterwards. She had a mediport placed during her hospitalization.  She has moderate fatigue, low appetie, she lost about 13 lbs in the past few months. She has  moderate abdominal pain, 5-6/10, persistent, she does not take any pain meds.   I reviewed her medical records extensively, and discussed her case with Dr. Jorje Guild and [place] program coordinator.  CURRENT THERAPY:  Maintenance Velcade 1.59m/m2 every 2 weeks, started on 01/16/2016, changed back to CyBorD every 2 weeks on 02/27/2016  INTERIM HISTORY: Ifrah returns for follow up and ongoing treatment. She is doing well overall. She has developed mild cold symptoms in the past few days, with nasal congestion, no fever, or productive cough. She tolerated last cycle chemo well.   MEDICAL HISTORY:  Past Medical History:  Diagnosis Date  . Chills with fever    intermittently since d/c from hospital  . Dysuria-frequency syndrome    w/ pink urine  . GERD (gastroesophageal reflux disease)   . Hepatitis   . History of positive PPD    DX 2011--  CXR DONE NO EVIDENCE  . History of ureter stent   . Hydronephrosis, right   . Neuromuscular disorder (HCC)    legs numb intermittently  . 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[place]   . Pneumonia   . Right ureteral stone   . Urosepsis 8/14   ad[facility]    SURGICAL HISTORY: Past Surgical History:  Procedure Laterality Date  . CYSTOSCOPY W/ URETERAL STENT PLACEMENT Right 09/25/2012   Procedure: CYSTOSCOPY WITH RETROGRADE PYELOGRAM/URETERAL STENT PLACEMENT;  Surgeon: TAlexis Frock MD;  Location: [facility];  Service: Urology;  Laterality: Right;  . CYSTOSCOPY WITH RETROGRADE PYELOGRAM, URETEROSCOPY AND STENT PLACEMENT Right 10/15/2012   Procedure: CYSTOSCOPY WITH RETROGRADE PYELOGRAM, URETEROSCOPY AND REMOVAL STENT WITH  STENT PLACEMENT;  Surgeon: TAlexis Frock MD;  [facility]  Service: Urology;  Laterality: Right;  . ESOPHAGOGASTRODUODENOSCOPY (EGD) WITH PROPOFOL N/A 11/16/2014   Procedure: ESOPHAGOGASTRODUODENOSCOPY (EGD) WITH PROPOFOL;  Surgeon: DMilus Banister MD;  Location: [facility];  Service: Endoscopy;  Laterality: N/A;   . HOLMIUM LASER APPLICATION Right 97/40/8144  Procedure: HOLMIUM LASER APPLICATION;  Surgeon: TAlexis Frock MD;  [facility]  Service: Urology;  Laterality: Right;  . LIVER BIOPSY    . OTHER SURGICAL HISTORY Right    removal of ovarian cyst  . removal of uterine cyst     years ago  . RIGHT VATS W/ DRAINAGE PEURAL EFFUSION AND BX'S  10-30-2008    SOCIAL HISTORY: Social History   Social History  . Marital status: Single    Spouse name: N/A  . Number of children: 3  . Years of education: N/A   Social History Main Topics  . Smoking status: Never Smoker  . Smokeless tobacco: Never Used  . Alcohol use No  . Drug use: No  . Sexual activity: Not Currently    Birth control/ protection: Abstinence   Other Topics Concern  . None   Social History Narrative  . None    FAMILY HISTORY: Family History  Problem Relation Age of Onset  . Stomach cancer Mother   . Lung disease Father   . Asthma Father     ALLERGIES:  has No Known Allergies.  MEDICATIONS:  Current Outpatient Prescriptions  Medication Sig Dispense Refill  .  acyclovir (ZOVIRAX) 800 MG tablet Take 1 tablet (800 mg total) by mouth 2 (two) times daily. 60 tablet 2  . dexamethasone (DECADRON) 4 MG tablet Take 1 tablet (4 mg total) by mouth 2 (two) times daily with a meal. 30 tablet 1  . docusate sodium (COLACE) 100 MG capsule Take 1 capsule (100 mg total) by mouth 2 (two) times daily. 60 capsule 1  . entecavir (BARACLUDE) 0.5 MG tablet Take 0.5 mg by mouth daily.    Marland Kitchen esomeprazole (NEXIUM) 40 MG capsule Take 1 capsule (40 mg total) by mouth daily at 12 noon. 30 capsule 2  . hydrocortisone (ANUSOL-HC) 2.5 % rectal cream Place 1 application rectally 2 (two) times daily. 30 g 0  . metFORMIN (GLUCOPHAGE) 500 MG tablet Take 1 tablet (500 mg total) by mouth 2 (two) times daily with a meal. 60 tablet 1  . omeprazole (PRILOSEC) 20 MG capsule Take 1 capsule (20 mg total) by mouth daily. 30 capsule 3    . ondansetron (ZOFRAN) 8 MG tablet Take 1 tablet (8 mg total) by mouth every 8 (eight) hours as needed for nausea or vomiting. 30 tablet 1  . polyethylene glycol powder (MIRALAX) powder Take 255 g by mouth daily. Take one capful mixed in water daily. 255 g 0  . potassium chloride SA (K-DUR,KLOR-CON) 20 MEQ tablet Take 1 tablet (20 mEq total) by mouth daily. Take 1 tablet ( 20 meq ) by mouth daily for 7 days;  Then as directed. 20 tablet 0   No current facility-administered medications for this visit.    Facility-Administered Medications Ordered in Other Visits  Medication Dose Route Frequency Provider Last Rate Last Dose  . sodium chloride 0.9 % injection 10 mL  10 mL Intravenous PRN Truitt Merle, MD   10 mL at 12/11/15 1532  . sodium chloride flush (NS) 0.9 % injection 10 mL  10 mL Intravenous PRN Truitt Merle, MD   10 mL at 10/09/15 1717    REVIEW OF SYSTEMS:   Constitutional: no chills or abnormal night sweats Eyes: Denies blurriness of vision, double vision or watery eyes Ears, nose, mouth, throat, and face: Denies mucositis or sore throat Respiratory: Denies cough, dyspnea or wheezes Cardiovascular: Denies palpitation, chest discomfort or lower extremity swelling Gastrointestinal:  Denies nausea, heartburn  Skin: Denies abnormal skin rashes Lymphatics: Denies new lymphadenopathy or easy bruising Neurological:Denies numbness, tingling or new weaknesses Behavioral/Psych: Mood is stable, no new changes  All other systems were reviewed with the patient and are negative.  PHYSICAL EXAMINATION: ECOG PERFORMANCE STATUS: 1 BP 99/65 (BP Location: Left Arm, Patient Position: Sitting)   Pulse 88   Temp 97.6 F (36.4 C) (Oral)   Resp 18   Ht '5\' 3"'  (1.6 m)   Wt 127 lb (57.6 kg)   SpO2 100%   BMI 22.50 kg/m  GENERAL:alert, no distress and comfortable SKIN: skin color, texture, turgor are normal, no rashes or significant lesions EYES: normal, conjunctiva are pink and non-injected, sclera  clear OROPHARYNX:no exudate, no erythema and lips, buccal mucosa NECK: supple, thyroid normal size, non-tender, without nodularity LYMPH:  no palpable lymphadenopathy in the cervical, axillary or inguinal LUNGS: clear to auscultation and percussion with normal breathing effort HEART: regular rate & rhythm and no murmurs and no lower extremity edema ABDOMEN:abdomen soft, non-tender and normal bowel sounds Musculoskeletal:no cyanosis of digits and no clubbing  PSYCH: alert & oriented x 3 with fluent speech NEURO: no focal motor/sensory deficits  LABORATORY DATA:  I have reviewed  the data as listed CBC Latest Ref Rng & Units 03/12/2016 02/27/2016 02/22/2016  WBC 3.9 - 10.3 10e3/uL 9.7 6.7 8.4  Hemoglobin 11.6 - 15.9 g/dL 10.4(L) 10.4(L) 10.4(L)  Hematocrit 34.8 - 46.6 % 31.9(L) 33.1(L) 32.3(L)  Platelets 145 - 400 10e3/uL 276 266 284    CMP Latest Ref Rng & Units 03/12/2016 02/27/2016 02/14/2016  Glucose 70 - 140 mg/dl 98 109 103(H)  BUN 7.0 - 26.0 mg/dL 14.7 11.0 17  Creatinine 0.6 - 1.1 mg/dL 0.6 0.7 0.72  Sodium 136 - 145 mEq/L 138 140 136  Potassium 3.5 - 5.1 mEq/L 4.1 4.0 3.6  Chloride 101 - 111 mmol/L - - 105  CO2 22 - 29 mEq/L '25 24 22  ' Calcium 8.4 - 10.4 mg/dL 9.8 9.4 9.0  Total Protein 6.4 - 8.3 g/dL 7.9 7.7 8.0  Total Bilirubin 0.20 - 1.20 mg/dL 0.31 0.26 0.7  Alkaline Phos 40 - 150 U/L 70 81 98  AST 5 - 34 U/L 25 26 45(H)  ALT 0 - 55 U/L 6 <6 17    SPEP M-protein  09/15/2014: 4.2 10/08/14: 4.6 12/08/2014: 0.2 02/15/2015: not sufficient sample for test   09/11/2015: not observed 10/09/2015: not det   11/06/2015: not det  12/25/2015: not det  02/13/2016: 0.3  IgG mg/dl 11/03/2014: 3150  12/08/2014:  759 02/14/2014: 860 09/11/2015: 1347 10/09/2015: 1457 11/06/2015: 1504 12/25/2015: 1400 02/13/2016: 1543  Kappa/lambda light chains levels and ration  11/03/14: 0.75, 152, 0.00 12/22/2014: 1.10, 2.60, 0.42 02/15/2015: 9.59,14.35, 0.67 09/11/2015: 2.44, 2.72, 0.90 10/09/2015: 3.09, 3.49,  0.89 11/06/2015: 2.30, 2.62, 0.88 12/25/2015: 2.51, 3.0, 0.84 02/13/2016: 2.64, 15.3, 0.17  24 h urine UPEP/IFE and light chain: 11/03/2014: IFE showed a monoclonal IgG heavy chain with associated lambda light chain. M protein was Undetectable    PATHOLOGY REPORT  Bone Marrow (BM) and Peripheral Blood (PB) FINAL PATHOLOGIC DIAGNOSIS  BONE MARROW: 07/11/2015 Hypocellular marrow (20%) with no increase in plasma cells (2%). See comment.  PERIPHERAL BLOOD: Mild anemia. No circulating plasma cells identified. See comment and CBC data.  COMMENT: The bone marrow core biopsy and clot section are hypocellular for age (20%, 10-40% range). There is no increase in plasma cells, which is confirmed by CD138 immunohistochemistry. Myeloid and erythroid elements are present in normal proportion. Megakaryocytes are morphologically unremarkable. There are no atypical lymphoid aggregates or granulomas.  The bone marrow aspirate smears are adequate for evaluation. Plasma cells are not increased in numbers (2%). There is no increase in blasts (<1%). Myeloid and erythroid elements are present in normal proportion with a M:E ratio of 2.5:1. There is no overt dysplasia of the myeloid, erythroid, or megakaryocytic lineages. There is no lymphocytosis.  Examination of peripheral blood reveals no circulating plasma cells or rouleaux formation. The background leukocytes are morphologically unremarkable and consist mostly of neutrophils.  Immunohistochemical controls worked appropriately.  BONE MARROW: 02/22/2016 Diagnosis Bone Marrow, Aspirate,Biopsy, and Clot, right iliac - NORMOCELLULAR BONE MARROW FOR AGE WITH TRILINEAGE HEMATOPOIESIS. - PLASMACYTOSIS (PLASMA CELLS 12%). - SEE COMMENT. PERIPHERAL BLOOD: - OCCASIONAL CIRCULATING PLASMA CELLS. Diagnosis Note The bone marrow is generally normocellular for age with trilineage hematopoiesis and nonspecific myeloid changes likely related to previous  therapy. In this background, the plasma cells are increased in number representing 12% of all cells associated with occasional small clusters and atypical cytomorphologic features. Immunohistochemical stains highlight the increased plasma cell component in the marrow but kappa and lambda staining is not entirely contributory with only possible weak lambda light chain excess. Nonetheless,  the overall features favor recurrent/residual plasma cell neoplasm. Correlation with cytogenetic and FISH studies is recommended. (BNS:kh/gt 02-25-16)  RADIOGRAPHIC STUDIES: I have personally reviewed the radiological images as listed and agreed with the findings in the report. Ct Biopsy  Result Date: 02/22/2016 CLINICAL DATA:  Leukemia EXAM: CT GUIDED DEEP ILIAC BONE ASPIRATION AND CORE BIOPSY TECHNIQUE: The procedure, risks (including but not limited to bleeding, infection, organ damage ), benefits, and alternatives were explained to the patient. Questions regarding the procedure were encouraged and answered. The patient understands and consents to the procedure. Patient was placed supine on the CT gantry and limited axial scans through the pelvis were obtained. Appropriate skin entry site was identified. Skin site was marked, prepped with Betadine, draped in usual sterile fashion, and infiltrated locally with 1% lidocaine. Intravenous Fentanyl and Versed were administered as conscious sedation during continuous monitoring of the patient's level of consciousness and physiological / cardiorespiratory status by the radiology RN, with a total moderate sedation time of 10 minutes. Under CT fluoroscopic guidance an 11-gauge Cook trocar bone needle was advanced into the right iliac bone just lateral to the sacroiliac joint. Once needle tip position was confirmed, coaxial core and aspiration samples were obtained. The final sample was obtained using the guiding needle itself, which was then removed. Post procedure scans show no  hematoma or fracture. Patient tolerated procedure well. COMPLICATIONS: COMPLICATIONS none IMPRESSION: 1. Technically successful CT guided right iliac bone core and aspiration biopsy. Electronically Signed   By: Lucrezia Europe M.D.   On: 02/22/2016 11:19   Ct Bone Marrow Aspiration  Result Date: 02/22/2016 CLINICAL DATA:  Leukemia EXAM: CT GUIDED DEEP ILIAC BONE ASPIRATION AND CORE BIOPSY TECHNIQUE: The procedure, risks (including but not limited to bleeding, infection, organ damage ), benefits, and alternatives were explained to the patient. Questions regarding the procedure were encouraged and answered. The patient understands and consents to the procedure. Patient was placed supine on the CT gantry and limited axial scans through the pelvis were obtained. Appropriate skin entry site was identified. Skin site was marked, prepped with Betadine, draped in usual sterile fashion, and infiltrated locally with 1% lidocaine. Intravenous Fentanyl and Versed were administered as conscious sedation during continuous monitoring of the patient's level of consciousness and physiological / cardiorespiratory status by the radiology RN, with a total moderate sedation time of 10 minutes. Under CT fluoroscopic guidance an 11-gauge Cook trocar bone needle was advanced into the right iliac bone just lateral to the sacroiliac joint. Once needle tip position was confirmed, coaxial core and aspiration samples were obtained. The final sample was obtained using the guiding needle itself, which was then removed. Post procedure scans show no hematoma or fracture. Patient tolerated procedure well. COMPLICATIONS: COMPLICATIONS none IMPRESSION: 1. Technically successful CT guided right iliac bone core and aspiration biopsy. Electronically Signed   By: Lucrezia Europe M.D.   On: 02/22/2016 11:19    ASSESSMENT & PLAN: 46 y.o. [place] woman, presented with anemia and leokocytosis   1. 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 Norma Fredrickson recommendation to start maintenance chemotherapy with CyBorD every 2 weeks, for at least 4 months, she has been tolerating well overall, this was changed to  maintenance therapy to Velcade 1.3 mg/m every 2 weeks in 01/2016, indefinitely, until disease progression -Her M protein has been undetectable since her transplant, however it became positive again last week, and her IgG level and lambda free light chain level has significantly increased also. This is highly suspicious for recurrent plasma cell leukemia. The total white count is normal. The mild anemia has been stable lately. I discussed the above with patient -We previously discussed bone marrow biopsy results from 02/22/2016. Unfortunately this showed increased plasma cells 12% with additional lambda light chain staining pending. This is likely relapse of disease. Cytogenetics was normal  -I have restarted her on CyBorD every 2 weeks, will continue, she will see Dr. Norma Fredrickson next month -continue acyclovir  -Continue Zometa every 4 weeks for a total of 2 years (until 11/2017)  2. Type 2 diabetes, steroids induced  -She was noticed to have increased blood glucose level lately, with random blood glucose above 200. She also developed diabetic symptoms. No history of diabetes in the past. -This is likely steroids induced hyperglycemia -I strongly recommend her to avoid any soft drink and sweats, and watch her carbohydrate intake, and exercise more  -She has been seen by her primary care physician. She was not given any medication to manage her glucose. I will call her primary care physician to discuss adding metformin. -She is going to  restart her dexamethasone as part of his leukemia treatment, I recommend her to take metformin 500 mg twice daily for 5 days after chemotherapy, to control her hyperglycemia. Potential side effects discussed, she agrees to proceed.  3. Chronic Hepatitis B  - She will follow-up GI clinic at [facility] -continue entecavir. The patient is requesting a refill of entecavir. Our nursing staff will call her physician's office to request this for her.  4. GERD, recent GI bleeding and nausea  -Continue omeprazole daily. We discussed steroids can worsen her acid reflux. -She had an EGD at [facility] in 2017. There is no record of a colonoscopy at that time.  -she has seen Dr. Ardis Hughs, colonoscopy is scheduled for next month    Plan -Lab reviewed, will continue CyBorD today and every two weeks -I will see her back in 4 weeks.  All questions were answered. The patient knows to call the clinic with any problems, questions or concerns.  I spent 20 minutes counseling the patient face to face. We used the vedio interpreter service. The total time spent in the appointment was 25 minutes and more than 50% was on counseling.    Truitt Merle, MD 03/12/2016

## 2016-03-12 ENCOUNTER — Encounter: Payer: Self-pay | Admitting: Hematology

## 2016-03-12 ENCOUNTER — Encounter (HOSPITAL_COMMUNITY): Payer: Self-pay

## 2016-03-12 ENCOUNTER — Ambulatory Visit (HOSPITAL_BASED_OUTPATIENT_CLINIC_OR_DEPARTMENT_OTHER): Payer: BLUE CROSS/BLUE SHIELD | Admitting: Hematology

## 2016-03-12 ENCOUNTER — Telehealth: Payer: Self-pay | Admitting: *Deleted

## 2016-03-12 ENCOUNTER — Telehealth: Payer: Self-pay | Admitting: Hematology

## 2016-03-12 ENCOUNTER — Other Ambulatory Visit (HOSPITAL_BASED_OUTPATIENT_CLINIC_OR_DEPARTMENT_OTHER): Payer: BLUE CROSS/BLUE SHIELD

## 2016-03-12 ENCOUNTER — Ambulatory Visit: Payer: BLUE CROSS/BLUE SHIELD

## 2016-03-12 ENCOUNTER — Ambulatory Visit (HOSPITAL_BASED_OUTPATIENT_CLINIC_OR_DEPARTMENT_OTHER): Payer: BLUE CROSS/BLUE SHIELD

## 2016-03-12 VITALS — BP 99/65 | HR 88 | Temp 97.6°F | Resp 18 | Ht 63.0 in | Wt 127.0 lb

## 2016-03-12 DIAGNOSIS — Z95828 Presence of other vascular implants and grafts: Secondary | ICD-10-CM

## 2016-03-12 DIAGNOSIS — T380X5A Adverse effect of glucocorticoids and synthetic analogues, initial encounter: Secondary | ICD-10-CM

## 2016-03-12 DIAGNOSIS — K922 Gastrointestinal hemorrhage, unspecified: Secondary | ICD-10-CM

## 2016-03-12 DIAGNOSIS — C9012 Plasma cell leukemia in relapse: Secondary | ICD-10-CM

## 2016-03-12 DIAGNOSIS — R110 Nausea: Secondary | ICD-10-CM

## 2016-03-12 DIAGNOSIS — C9011 Plasma cell leukemia in remission: Secondary | ICD-10-CM

## 2016-03-12 DIAGNOSIS — Z5112 Encounter for antineoplastic immunotherapy: Secondary | ICD-10-CM | POA: Diagnosis not present

## 2016-03-12 DIAGNOSIS — D649 Anemia, unspecified: Secondary | ICD-10-CM

## 2016-03-12 DIAGNOSIS — Z5111 Encounter for antineoplastic chemotherapy: Secondary | ICD-10-CM | POA: Diagnosis not present

## 2016-03-12 DIAGNOSIS — E099 Drug or chemical induced diabetes mellitus without complications: Secondary | ICD-10-CM | POA: Diagnosis not present

## 2016-03-12 DIAGNOSIS — B181 Chronic viral hepatitis B without delta-agent: Secondary | ICD-10-CM | POA: Diagnosis not present

## 2016-03-12 DIAGNOSIS — C9010 Plasma cell leukemia not having achieved remission: Secondary | ICD-10-CM

## 2016-03-12 DIAGNOSIS — K219 Gastro-esophageal reflux disease without esophagitis: Secondary | ICD-10-CM | POA: Diagnosis not present

## 2016-03-12 LAB — CBC WITH DIFFERENTIAL/PLATELET
BASO%: 0.7 % (ref 0.0–2.0)
BASOS ABS: 0.1 10*3/uL (ref 0.0–0.1)
EOS ABS: 0.1 10*3/uL (ref 0.0–0.5)
EOS%: 0.9 % (ref 0.0–7.0)
HEMATOCRIT: 31.9 % — AB (ref 34.8–46.6)
HEMOGLOBIN: 10.4 g/dL — AB (ref 11.6–15.9)
LYMPH#: 0.5 10*3/uL — AB (ref 0.9–3.3)
LYMPH%: 5.2 % — ABNORMAL LOW (ref 14.0–49.7)
MCH: 28.2 pg (ref 25.1–34.0)
MCHC: 32.6 g/dL (ref 31.5–36.0)
MCV: 86.4 fL (ref 79.5–101.0)
MONO#: 0.2 10*3/uL (ref 0.1–0.9)
MONO%: 2.2 % (ref 0.0–14.0)
NEUT#: 8.8 10*3/uL — ABNORMAL HIGH (ref 1.5–6.5)
NEUT%: 91 % — ABNORMAL HIGH (ref 38.4–76.8)
PLATELETS: 276 10*3/uL (ref 145–400)
RBC: 3.69 10*6/uL — ABNORMAL LOW (ref 3.70–5.45)
RDW: 13.9 % (ref 11.2–14.5)
WBC: 9.7 10*3/uL (ref 3.9–10.3)

## 2016-03-12 LAB — COMPREHENSIVE METABOLIC PANEL
ALBUMIN: 3.3 g/dL — AB (ref 3.5–5.0)
ALK PHOS: 70 U/L (ref 40–150)
ALT: 6 U/L (ref 0–55)
ANION GAP: 7 meq/L (ref 3–11)
AST: 25 U/L (ref 5–34)
BILIRUBIN TOTAL: 0.31 mg/dL (ref 0.20–1.20)
BUN: 14.7 mg/dL (ref 7.0–26.0)
CALCIUM: 9.8 mg/dL (ref 8.4–10.4)
CO2: 25 mEq/L (ref 22–29)
Chloride: 106 mEq/L (ref 98–109)
Creatinine: 0.6 mg/dL (ref 0.6–1.1)
Glucose: 98 mg/dl (ref 70–140)
Potassium: 4.1 mEq/L (ref 3.5–5.1)
Sodium: 138 mEq/L (ref 136–145)
Total Protein: 7.9 g/dL (ref 6.4–8.3)

## 2016-03-12 MED ORDER — PALONOSETRON HCL INJECTION 0.25 MG/5ML
0.2500 mg | Freq: Once | INTRAVENOUS | Status: AC
  Administered 2016-03-12: 0.25 mg via INTRAVENOUS

## 2016-03-12 MED ORDER — SODIUM CHLORIDE 0.9 % IJ SOLN
10.0000 mL | INTRAMUSCULAR | Status: DC | PRN
  Administered 2016-03-12: 10 mL via INTRAVENOUS
  Filled 2016-03-12: qty 10

## 2016-03-12 MED ORDER — PALONOSETRON HCL INJECTION 0.25 MG/5ML
INTRAVENOUS | Status: AC
Start: 2016-03-12 — End: 2016-03-12
  Filled 2016-03-12: qty 5

## 2016-03-12 MED ORDER — SODIUM CHLORIDE 0.9 % IV SOLN
300.0000 mg/m2 | Freq: Once | INTRAVENOUS | Status: AC
  Administered 2016-03-12: 480 mg via INTRAVENOUS
  Filled 2016-03-12: qty 24

## 2016-03-12 MED ORDER — HEPARIN SOD (PORK) LOCK FLUSH 100 UNIT/ML IV SOLN
500.0000 [IU] | Freq: Once | INTRAVENOUS | Status: AC | PRN
  Administered 2016-03-12: 500 [IU]
  Filled 2016-03-12: qty 5

## 2016-03-12 MED ORDER — SODIUM CHLORIDE 0.9 % IV SOLN
Freq: Once | INTRAVENOUS | Status: AC
  Administered 2016-03-12: 14:00:00 via INTRAVENOUS

## 2016-03-12 MED ORDER — BORTEZOMIB CHEMO SQ INJECTION 3.5 MG (2.5MG/ML)
1.5000 mg/m2 | Freq: Once | INTRAMUSCULAR | Status: AC
  Administered 2016-03-12: 2.5 mg via SUBCUTANEOUS
  Filled 2016-03-12: qty 2.5

## 2016-03-12 MED ORDER — DEXAMETHASONE SODIUM PHOSPHATE 10 MG/ML IJ SOLN
10.0000 mg | Freq: Once | INTRAMUSCULAR | Status: AC
  Administered 2016-03-12: 10 mg via INTRAVENOUS

## 2016-03-12 MED ORDER — DEXAMETHASONE SODIUM PHOSPHATE 10 MG/ML IJ SOLN
INTRAMUSCULAR | Status: AC
  Filled 2016-03-12: qty 1

## 2016-03-12 MED ORDER — SODIUM CHLORIDE 0.9% FLUSH
10.0000 mL | INTRAVENOUS | Status: DC | PRN
  Administered 2016-03-12: 10 mL
  Filled 2016-03-12: qty 10

## 2016-03-12 NOTE — Patient Instructions (Signed)
[facility] Discharge Instructions for Patients Receiving Chemotherapy  Today you received the following chemotherapy agents Cytoxan/Velcade  To help prevent nausea and vomiting after your treatment, we encourage you to take your nausea medication as prescribed.   If you develop nausea and vomiting that is not controlled by your nausea medication, call the clinic.   BELOW ARE SYMPTOMS THAT SHOULD BE REPORTED IMMEDIATELY:  *FEVER GREATER THAN 100.5 F  *CHILLS WITH OR WITHOUT FEVER  NAUSEA AND VOMITING THAT IS NOT CONTROLLED WITH YOUR NAUSEA MEDICATION  *UNUSUAL SHORTNESS OF BREATH  *UNUSUAL BRUISING OR BLEEDING  TENDERNESS IN MOUTH AND THROAT WITH OR WITHOUT PRESENCE OF ULCERS  *URINARY PROBLEMS  *BOWEL PROBLEMS  UNUSUAL RASH Items with * indicate a potential emergency and should be followed up as soon as possible.  Feel free to call the clinic you have any questions or concerns. The clinic phone number is [phone].  Please show the CHEMO ALERT CARD at check-in to the Emergency Department and triage nurse.   

## 2016-03-12 NOTE — Telephone Encounter (Signed)
Per 2/7 LOS and staff message I have scheduled appts and notified the scheduler  

## 2016-03-12 NOTE — Telephone Encounter (Signed)
Appointments scheduled per 2/7 LOS. Patient given AVS report and calendars with future scheduled appointments. °

## 2016-03-13 LAB — KAPPA/LAMBDA LIGHT CHAINS
IG LAMBDA FREE LIGHT CHAIN: 455.2 mg/L — AB (ref 5.7–26.3)
Ig Kappa Free Light Chain: 22.7 mg/L — ABNORMAL HIGH (ref 3.3–19.4)
KAPPA/LAMBDA FLC RATIO: 0.05 — AB (ref 0.26–1.65)

## 2016-03-14 LAB — MULTIPLE MYELOMA PANEL, SERUM
Albumin SerPl Elph-Mcnc: 3.1 g/dL (ref 2.9–4.4)
Albumin/Glob SerPl: 0.8 (ref 0.7–1.7)
Alpha 1: 0.3 g/dL (ref 0.0–0.4)
Alpha2 Glob SerPl Elph-Mcnc: 1.3 g/dL — ABNORMAL HIGH (ref 0.4–1.0)
B-Globulin SerPl Elph-Mcnc: 1 g/dL (ref 0.7–1.3)
Gamma Glob SerPl Elph-Mcnc: 1.9 g/dL — ABNORMAL HIGH (ref 0.4–1.8)
Globulin, Total: 4.4 g/dL — ABNORMAL HIGH (ref 2.2–3.9)
IgA, Qn, Serum: 137 mg/dL (ref 87–352)
IgG, Qn, Serum: 1860 mg/dL — ABNORMAL HIGH (ref 700–1600)
IgM, Qn, Serum: 62 mg/dL (ref 26–217)
M Protein SerPl Elph-Mcnc: 0.8 g/dL — ABNORMAL HIGH
Total Protein: 7.5 g/dL (ref 6.0–8.5)

## 2016-03-15 ENCOUNTER — Encounter: Payer: Self-pay | Admitting: Hematology

## 2016-03-26 ENCOUNTER — Other Ambulatory Visit (HOSPITAL_BASED_OUTPATIENT_CLINIC_OR_DEPARTMENT_OTHER): Payer: BLUE CROSS/BLUE SHIELD

## 2016-03-26 ENCOUNTER — Ambulatory Visit (HOSPITAL_BASED_OUTPATIENT_CLINIC_OR_DEPARTMENT_OTHER): Payer: BLUE CROSS/BLUE SHIELD

## 2016-03-26 VITALS — BP 97/58 | HR 80 | Temp 98.2°F | Resp 16

## 2016-03-26 DIAGNOSIS — C9012 Plasma cell leukemia in relapse: Secondary | ICD-10-CM

## 2016-03-26 DIAGNOSIS — Z5112 Encounter for antineoplastic immunotherapy: Secondary | ICD-10-CM

## 2016-03-26 DIAGNOSIS — C9010 Plasma cell leukemia not having achieved remission: Secondary | ICD-10-CM

## 2016-03-26 DIAGNOSIS — Z5111 Encounter for antineoplastic chemotherapy: Secondary | ICD-10-CM

## 2016-03-26 DIAGNOSIS — Z95828 Presence of other vascular implants and grafts: Secondary | ICD-10-CM

## 2016-03-26 DIAGNOSIS — C9011 Plasma cell leukemia in remission: Secondary | ICD-10-CM

## 2016-03-26 LAB — CBC WITH DIFFERENTIAL/PLATELET
BASO%: 0.4 % (ref 0.0–2.0)
Basophils Absolute: 0 10*3/uL (ref 0.0–0.1)
EOS%: 1.5 % (ref 0.0–7.0)
Eosinophils Absolute: 0.1 10*3/uL (ref 0.0–0.5)
HCT: 28.4 % — ABNORMAL LOW (ref 34.8–46.6)
HGB: 9.5 g/dL — ABNORMAL LOW (ref 11.6–15.9)
LYMPH#: 0.6 10*3/uL — AB (ref 0.9–3.3)
LYMPH%: 10.9 % — AB (ref 14.0–49.7)
MCH: 28.9 pg (ref 25.1–34.0)
MCHC: 33.4 g/dL (ref 31.5–36.0)
MCV: 86.6 fL (ref 79.5–101.0)
MONO#: 0.7 10*3/uL (ref 0.1–0.9)
MONO%: 13 % (ref 0.0–14.0)
NEUT#: 4 10*3/uL (ref 1.5–6.5)
NEUT%: 74.2 % (ref 38.4–76.8)
Platelets: 224 10*3/uL (ref 145–400)
RBC: 3.28 10*6/uL — AB (ref 3.70–5.45)
RDW: 14.9 % — ABNORMAL HIGH (ref 11.2–14.5)
WBC: 5.4 10*3/uL (ref 3.9–10.3)

## 2016-03-26 LAB — COMPREHENSIVE METABOLIC PANEL
ALT: 12 U/L (ref 0–55)
AST: 35 U/L — AB (ref 5–34)
Albumin: 2.8 g/dL — ABNORMAL LOW (ref 3.5–5.0)
Alkaline Phosphatase: 93 U/L (ref 40–150)
Anion Gap: 6 mEq/L (ref 3–11)
BUN: 10.1 mg/dL (ref 7.0–26.0)
CHLORIDE: 110 meq/L — AB (ref 98–109)
CO2: 26 meq/L (ref 22–29)
CREATININE: 0.6 mg/dL (ref 0.6–1.1)
Calcium: 9.2 mg/dL (ref 8.4–10.4)
EGFR: >90 mL/min/{1.73_m2} (ref >90)
Glucose: 89 mg/dl (ref 70–140)
Potassium: 3.9 mEq/L (ref 3.5–5.1)
SODIUM: 142 meq/L (ref 136–145)
Total Bilirubin: <0.22 mg/dL (ref 0.20–1.20)
Total Protein: 6.7 g/dL (ref 6.4–8.3)

## 2016-03-26 MED ORDER — ZOLEDRONIC ACID 4 MG/100ML IV SOLN
4.0000 mg | Freq: Once | INTRAVENOUS | Status: AC
  Administered 2016-03-26: 4 mg via INTRAVENOUS
  Filled 2016-03-26: qty 100

## 2016-03-26 MED ORDER — PALONOSETRON HCL INJECTION 0.25 MG/5ML
0.2500 mg | Freq: Once | INTRAVENOUS | Status: AC
  Administered 2016-03-26: 0.25 mg via INTRAVENOUS

## 2016-03-26 MED ORDER — DEXAMETHASONE SODIUM PHOSPHATE 10 MG/ML IJ SOLN
INTRAMUSCULAR | Status: AC
  Filled 2016-03-26: qty 1

## 2016-03-26 MED ORDER — PALONOSETRON HCL INJECTION 0.25 MG/5ML
INTRAVENOUS | Status: AC
  Filled 2016-03-26: qty 5

## 2016-03-26 MED ORDER — HEPARIN SOD (PORK) LOCK FLUSH 100 UNIT/ML IV SOLN
500.0000 [IU] | Freq: Once | INTRAVENOUS | Status: AC | PRN
  Administered 2016-03-26: 500 [IU]
  Filled 2016-03-26: qty 5

## 2016-03-26 MED ORDER — DEXAMETHASONE SODIUM PHOSPHATE 10 MG/ML IJ SOLN
10.0000 mg | Freq: Once | INTRAMUSCULAR | Status: AC
  Administered 2016-03-26: 10 mg via INTRAVENOUS

## 2016-03-26 MED ORDER — SODIUM CHLORIDE 0.9 % IV SOLN
300.0000 mg/m2 | Freq: Once | INTRAVENOUS | Status: AC
  Administered 2016-03-26: 480 mg via INTRAVENOUS
  Filled 2016-03-26: qty 24

## 2016-03-26 MED ORDER — SODIUM CHLORIDE 0.9 % IV SOLN
Freq: Once | INTRAVENOUS | Status: AC
  Administered 2016-03-26: 10:00:00 via INTRAVENOUS

## 2016-03-26 MED ORDER — BORTEZOMIB CHEMO SQ INJECTION 3.5 MG (2.5MG/ML)
1.5000 mg/m2 | Freq: Once | INTRAMUSCULAR | Status: AC
  Administered 2016-03-26: 2.5 mg via SUBCUTANEOUS
  Filled 2016-03-26: qty 2.5

## 2016-03-26 MED ORDER — SODIUM CHLORIDE 0.9% FLUSH
10.0000 mL | INTRAVENOUS | Status: DC | PRN
  Administered 2016-03-26: 10 mL
  Filled 2016-03-26: qty 10

## 2016-03-26 NOTE — Patient Instructions (Signed)
[place] Discharge Instructions for Patients Receiving Chemotherapy  Today you received the following chemotherapy agents:  Cytoxan, Velcade  To help prevent nausea and vomiting after your treatment, we encourage you to take your nausea medication as prescribed.   If you develop nausea and vomiting that is not controlled by your nausea medication, call the clinic.   BELOW ARE SYMPTOMS THAT SHOULD BE REPORTED IMMEDIATELY:  *FEVER GREATER THAN 100.5 F  *CHILLS WITH OR WITHOUT FEVER  NAUSEA AND VOMITING THAT IS NOT CONTROLLED WITH YOUR NAUSEA MEDICATION  *UNUSUAL SHORTNESS OF BREATH  *UNUSUAL BRUISING OR BLEEDING  TENDERNESS IN MOUTH AND THROAT WITH OR WITHOUT PRESENCE OF ULCERS  *URINARY PROBLEMS  *BOWEL PROBLEMS  UNUSUAL RASH Items with * indicate a potential emergency and should be followed up as soon as possible.  Feel free to call the clinic you have any questions or concerns. The clinic phone number is [phone].  Please show the [place] at check-in to the Emergency Department and triage nurse.

## 2016-03-31 ENCOUNTER — Encounter (HOSPITAL_COMMUNITY): Payer: Self-pay

## 2016-03-31 ENCOUNTER — Emergency Department (HOSPITAL_COMMUNITY)
Admission: EM | Admit: 2016-03-31 | Discharge: 2016-03-31 | Disposition: A | Discharge status: Home / Self Care | Payer: BLUE CROSS/BLUE SHIELD | Attending: Emergency Medicine | Admitting: Emergency Medicine

## 2016-03-31 DIAGNOSIS — R197 Diarrhea, unspecified: Secondary | ICD-10-CM | POA: Insufficient documentation

## 2016-03-31 DIAGNOSIS — R1084 Generalized abdominal pain: Secondary | ICD-10-CM | POA: Insufficient documentation

## 2016-03-31 DIAGNOSIS — Z5321 Procedure and treatment not carried out due to patient leaving prior to being seen by health care provider: Secondary | ICD-10-CM | POA: Diagnosis not present

## 2016-03-31 LAB — CBC
HCT: 31.2 % — ABNORMAL LOW (ref 36.0–46.0)
Hemoglobin: 10 g/dL — ABNORMAL LOW (ref 12.0–15.0)
MCH: 27.9 pg (ref 26.0–34.0)
MCHC: 32.1 g/dL (ref 30.0–36.0)
MCV: 87.2 fL (ref 78.0–100.0)
PLATELETS: 229 10*3/uL (ref 150–400)
RBC: 3.58 MIL/uL — AB (ref 3.87–5.11)
RDW: 14.5 % (ref 11.5–15.5)
WBC: 7.6 10*3/uL (ref 4.0–10.5)

## 2016-03-31 LAB — COMPREHENSIVE METABOLIC PANEL
ALT: 8 U/L — ABNORMAL LOW (ref 14–54)
AST: 22 U/L (ref 15–41)
Albumin: 3.3 g/dL — ABNORMAL LOW (ref 3.5–5.0)
Alkaline Phosphatase: 68 U/L (ref 38–126)
Anion gap: 6 (ref 5–15)
BILIRUBIN TOTAL: 0.4 mg/dL (ref 0.3–1.2)
BUN: 10 mg/dL (ref 6–20)
CALCIUM: 8.7 mg/dL — AB (ref 8.9–10.3)
CO2: 24 mmol/L (ref 22–32)
CREATININE: 0.58 mg/dL (ref 0.44–1.00)
Chloride: 108 mmol/L (ref 101–111)
Glucose, Bld: 91 mg/dL (ref 65–99)
Potassium: 3.2 mmol/L — ABNORMAL LOW (ref 3.5–5.1)
Sodium: 138 mmol/L (ref 135–145)
TOTAL PROTEIN: 7.1 g/dL (ref 6.5–8.1)

## 2016-03-31 LAB — LIPASE, BLOOD: Lipase: 18 U/L (ref 11–51)

## 2016-03-31 NOTE — ED Notes (Signed)
Called name again, but still no response from waiting room.

## 2016-03-31 NOTE — ED Notes (Signed)
Pt called again from triage, no answer 

## 2016-03-31 NOTE — ED Triage Notes (Signed)
Pt called from triage,  with no answer 

## 2016-03-31 NOTE — ED Triage Notes (Signed)
PT C/O GENERALIZED ABDOMINAL PAIN WITH N/V/D SINCE Friday. PT STS THE STOOL HAS A LOT OF MUCUS IN IT. DENIES FEVER.

## 2016-03-31 NOTE — ED Notes (Signed)
Called name, but no response from waiting room. 

## 2016-04-04 ENCOUNTER — Telehealth: Payer: Self-pay | Admitting: *Deleted

## 2016-04-04 NOTE — Telephone Encounter (Addendum)
"  Mom has an appointment in [facility] for 24 hour urine.  Could we get a jug from your office?  Asked what type of urine test to ensure correct type of jug and instructions provided.  Daughter does not know this information.  Advised she obtain from this lab to ensure sample collected correctly.  No further questions.

## 2016-04-08 NOTE — Progress Notes (Addendum)
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      10/10/2014 Miscellaneous    Peripheral blood chemistry and leukocytosis with total white count 78K, comprised of large plasma cells and his normocytic anemia. There is a myeloid left shift with previous surgical radium blasts. Flow cytometry showed 64% plasma cells      10/10/2014 Bone Marrow Biopsy    Markedly hypercellular marrow (95%), Atypical plasma cells comprise 57% of the cellularity. There was diminished multilineage in hematopoiesis with adequate maturation. Breasts less than 1%), no overt dysplasia of the myeloid or erythroid lineages.       10/10/2014 Initial Diagnosis    Plasma cell leukemia      10/13/2014 - 02/22/2015 Chemotherapy    CyborD (cytoxan 369m/m2 iv, bortezomib 1.5 mg/m, dexamethasone 40 mg, weekly every 28 days, bortezomib and dexamethasone was given twice weekly for 2 weeks during the first cycle)      04/04/2015 Bone Marrow Transplant    [facility] course was complicated by sepsis from Escherichia coli bacteremia and associated colitis, she was discharged home on 04/27/2015.      05/07/2015 - 05/12/2015 Hospital Admission    [facility], [facility]. ID workup was negative, EGD showed evidence of gastritis and duodenitis, no H. pylori or CMV.      05/20/2015 - 05/24/2015 Hospital Admission    [facility]      07/11/2015 Bone Marrow Biopsy    Post transplant 100 a bone marrow biopsy showed hypocellular marrow, 20%, no increase in plasma cells (2%) or other abnormalities.      08/22/2015 - 01/02/2016 Chemotherapy    MaintenanceCyborD (cytoxan 3016mm2 iv, bortezomib 1.5 mg/m, dexamethasone 40 mg, every 2 weeks, changed to Velcade maintenance after 4 months treatment      01/16/2016 -  Chemotherapy    Maintenance Velcade 1.3 mg/m every 2 weeks      02/22/2016 Pathology Results    BONE MARROW: Diagnosis Bone Marrow, Aspirate,Biopsy, and Clot, right iliac - NORMOCELLULAR BONE MARROW FOR AGE WITH TRILINEAGE HEMATOPOIESIS. - PLASMACYTOSIS (PLASMA CELLS 12%). - SEE COMMENT. PERIPHERAL BLOOD: - OCCASIONAL CIRCULATING PLASMA CELLS.       HISTORY OF PRESENTING ILLNESS:  Sheryl [name] [age].o. female is here because of recently diagnosed plasma cell leukemia. [facility] and is here to establish her local oncological care [place] is accompanied to the clinic by her daughter and interpreter.  She presented to our hospital lth with worsening dyspnea, fatigue, cough, subjective fevers and chills, and was admitted on 09/14/2014. She was found to have allergy WBC 54.1K, hemoglobin 8.1, plt 310, she was treated with symptom management, and was discharged home on 09/17/2014, with a plan to follow-up with hematology. She represents to emergency room  on 10/07/2014, was found to have white count of 78K, and worsening anemia with hemoglobin 6. She was seen by my partner Dr. Jonette Eva and plasma cell leukemia was suspected. She was transferred to [facility] for further leukemia work-up and treatment. Bone marrow biopsy was done, which confirmed plasma cell leukemia, and she started  chemotherapy with Velcade, Cytoxan and dexamethasone. She received weekly dose twice, last dose on 10/20/2014. She was discharged to home afterwards. She had a mediport placed during her hospitalization.  She has moderate fatigue, low appetie, she lost about 13 lbs in the past few months. She has moderate abdominal pain, 5-6/10, persistent, she does not take any pain meds.   I reviewed her medical records extensively, and discussed her case with Dr. Jorje Guild and Lindsay program coordinator.  CURRENT THERAPY:  restarted CyBorD every 2 weeks on 02/27/2016  INTERIM HISTORY: [name] returns for follow up and ongoing treatment. She is accompanied by a medical translator today. The patient reports feeling tired and sick following her last chemo infusion. She reports intermittent fever for the past week,   abdominal pain and diarrhea following chemo. Now, she reports loose bowel movements with some pain. She reports a poor appetite, but she tries to eat what she can. She reports dysuria x 1 week.  She reports ongoing fever of 2 weeks for which she takes Ibuprofen with relief. This morning the patient took liquid Tylenol. She reports her daughter tried to call the clinic, and the patient reported to the ED, but waited several hours and left without being evaluated. She was seen by Dr. Norma Fredrickson at [facility] the past Monday. LP and blood cultures were done, results pending.  MEDICAL HISTORY:  Past Medical History:  Diagnosis Date  . Chills with fever    intermittently since d/c from hospital  . Dysuria-frequency syndrome    w/ pink urine  . GERD (gastroesophageal reflux disease)   . Hepatitis   . History of positive PPD    DX 2011--  CXR DONE NO EVIDENCE  . History of ureter stent   . Hydronephrosis, right   . Neuromuscular disorder ([facility])    legs numb intermittently  . 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[place])   . Pneumonia   . Right ureteral stone   . Urosepsis 8/14   admitted to [facility]    SURGICAL  HISTORY: Past Surgical History:  Procedure Laterality Date  . CYSTOSCOPY W/ URETERAL STENT PLACEMENT Right 09/25/2012   Procedure: CYSTOSCOPY WITH RETROGRADE PYELOGRAM/URETERAL STENT PLACEMENT;  Surgeon: Alexis Frock, MD;  Location: [facility];  Service: Urology;  Laterality: Right;  . CYSTOSCOPY WITH RETROGRADE PYELOGRAM, URETEROSCOPY AND STENT PLACEMENT Right 10/15/2012   Procedure: CYSTOSCOPY WITH RETROGRADE PYELOGRAM, URETEROSCOPY AND REMOVAL STENT WITH  STENT PLACEMENT;  Surgeon: Alexis Frock, MD;  [facility];  Service: Urology;  Laterality: Right;  . ESOPHAGOGASTRODUODENOSCOPY (EGD) WITH PROPOFOL N/A 11/16/2014   Procedure: ESOPHAGOGASTRODUODENOSCOPY (EGD) WITH PROPOFOL;  Surgeon: Milus Banister, MD;  Location: [facility];  Service: Endoscopy;  Laterality: N/A;  . HOLMIUM LASER APPLICATION Right 10/06/4095   Procedure: HOLMIUM LASER APPLICATION;  Surgeon: Alexis Frock, MD;  Location: [facility];  Service: Urology;  Laterality: Right;  . LIVER BIOPSY    . OTHER SURGICAL HISTORY Right    removal of ovarian cyst  . removal of uterine cyst     years ago  . RIGHT VATS W/ DRAINAGE PEURAL EFFUSION AND BX'S  10-30-2008    SOCIAL HISTORY: Social History  Social History  . Marital status: Single    Spouse name: N/A  . Number of children: 3  . Years of education: N/A   Social History Main Topics  . Smoking status: Never Smoker  . Smokeless tobacco: Never Used  . Alcohol use No  . Drug use: No  . Sexual activity: Not Currently    Birth control/ protection: Abstinence   Other Topics Concern  . None   Social History Narrative  . None    FAMILY HISTORY: Family History  Problem Relation Age of Onset  . Stomach cancer Mother   . Lung disease Father   . Asthma Father     ALLERGIES:  has No Known Allergies.  MEDICATIONS:  Current Outpatient Prescriptions  Medication Sig Dispense Refill  . acyclovir (ZOVIRAX) 800 MG tablet Take 1  tablet (800 mg total) by mouth 2 (two) times daily. 60 tablet 2  . dexamethasone (DECADRON) 4 MG tablet Take 1 tablet (4 mg total) by mouth 2 (two) times daily with a meal. 30 tablet 1  . entecavir (BARACLUDE) 0.5 MG tablet Take 0.5 mg by mouth daily.    Marland Kitchen esomeprazole (NEXIUM) 40 MG capsule Take 1 capsule (40 mg total) by mouth daily at 12 noon. 30 capsule 2  . hydrocortisone (ANUSOL-HC) 2.5 % rectal cream Place 1 application rectally 2 (two) times daily. 30 g 0  . lidocaine-prilocaine (EMLA) cream Apply topically once. Before port access 30 g 0  . metFORMIN (GLUCOPHAGE) 500 MG tablet Take 1 tablet (500 mg total) by mouth 2 (two) times daily with a meal. 60 tablet 1  . ondansetron (ZOFRAN) 8 MG tablet Take 1 tablet (8 mg total) by mouth every 8 (eight) hours as needed for nausea or vomiting. 30 tablet 1  . docusate sodium (COLACE) 100 MG capsule Take 1 capsule (100 mg total) by mouth 2 (two) times daily. (Patient not taking: Reported on 04/09/2016) 60 capsule 1  . levofloxacin (LEVAQUIN) 750 MG tablet Take 1 tablet (750 mg total) by mouth daily. 7 tablet 0  . omeprazole (PRILOSEC) 20 MG capsule Take 1 capsule (20 mg total) by mouth daily. (Patient not taking: Reported on 04/09/2016) 30 capsule 3  . polyethylene glycol powder (MIRALAX) powder Take 255 g by mouth daily. Take one capful mixed in water daily. (Patient not taking: Reported on 04/09/2016) 255 g 0  . potassium chloride SA (K-DUR,KLOR-CON) 20 MEQ tablet Take 1 tablet (20 mEq total) by mouth daily. Take 1 tablet ( 20 meq ) by mouth daily for 7 days;  Then as directed. (Patient not taking: Reported on 04/09/2016) 20 tablet 0   No current facility-administered medications for this visit.    Facility-Administered Medications Ordered in Other Visits  Medication Dose Route Frequency Provider Last Rate Last Dose  . sodium chloride 0.9 % injection 10 mL  10 mL Intravenous PRN Truitt Merle, MD   10 mL at 12/11/15 1532  . sodium chloride flush (NS) 0.9 %  injection 10 mL  10 mL Intravenous PRN Truitt Merle, MD   10 mL at 10/09/15 1717    REVIEW OF SYSTEMS:   Constitutional: no chills or abnormal night sweats, (+) fever, poor appetite Eyes: Denies blurriness of vision, double vision or watery eyes Ears, nose, mouth, throat, and face: Denies mucositis or sore throat Respiratory: Denies cough, dyspnea or wheezes Cardiovascular: Denies palpitation, chest discomfort or lower extremity swelling Gastrointestinal:  Denies heartburn, (+) abdominal pain, nausea, diarrhea, dysuria Skin: Denies abnormal skin rashes Lymphatics: Denies  new lymphadenopathy or easy bruising Neurological:Denies numbness, tingling or new weaknesses Behavioral/Psych: Mood is stable, no new changes  All other systems were reviewed with the patient and are negative.  PHYSICAL EXAMINATION: ECOG PERFORMANCE STATUS: 1 BP (!) 92/52 (BP Location: Left Arm, Patient Position: Sitting) Comment: Made nurse aware of pt BP  Pulse 97   Temp 98.1 F (36.7 C) (Oral)   Resp 18   Ht _0  (1.6 m)   Wt 124 lb 4.8 oz (56.4 kg)   SpO2 100%   BMI 22.02 kg/m   GENERAL:alert, no distress and comfortable SKIN: skin color, texture, turgor are normal, no rashes or significant lesions EYES: normal, conjunctiva are pink and non-injected, sclera clear OROPHARYNX:no exudate, no erythema and lips, buccal mucosa, some secretion from the tonsil NECK: supple, thyroid normal size, non-tender, without nodularity LYMPH:  no palpable lymphadenopathy in the cervical, axillary or inguinal LUNGS: clear to auscultation and percussion with normal breathing effort HEART: regular rate & rhythm and no murmurs and no lower extremity edema ABDOMEN:abdomen soft, non-tender and normal bowel sounds Musculoskeletal:no cyanosis of digits and no clubbing  PSYCH: alert & oriented x 3 with fluent speech NEURO: no focal motor/sensory deficits  LABORATORY DATA:  I have reviewed the data as listed CBC Latest Ref Rng &  Units 04/09/2016 03/31/2016 03/26/2016  WBC 3.9 - 10.3 10e3/uL 7.9 7.6 5.4  Hemoglobin 11.6 - 15.9 g/dL 9.0(L) 10.0(L) 9.5(L)  Hematocrit 34.8 - 46.6 % 27.3(L) 31.2(L) 28.4(L)  Platelets 145 - 400 10e3/uL 217 229 224    CMP Latest Ref Rng & Units 04/09/2016 03/31/2016 03/26/2016  Glucose 70 - 140 mg/dl 113 91 89  BUN 7.0 - 26.0 mg/dL 10.5 10 10.1  Creatinine 0.6 - 1.1 mg/dL 0.7 0.58 0.6  Sodium 136 - 145 mEq/L 139 138 142  Potassium 3.5 - 5.1 mEq/L 3.4(L) 3.2(L) 3.9  Chloride 101 - 111 mmol/L - 108 -  CO2 22 - 29 mEq/L _1 Calcium 8.4 - 10.4 mg/dL 8.8 8.7(L) 9.2  Total Protein 6.4 - 8.3 g/dL 6.9 7.1 6.7  Total Bilirubin 0.20 - 1.20 mg/dL <0.22 0.4 <0.22  Alkaline Phos 40 - 150 U/L 133 68 93  AST 5 - 34 U/L 26 22 35(H)  ALT 0 - 55 U/L 7 8(L) 12    SPEP M-protein  09/15/2014: 4.2 10/08/14: 4.6 12/08/2014: 0.2 02/15/2015: not sufficient sample for test   09/11/2015: not observed 10/09/2015: not det   11/06/2015: not det  12/25/2015: not det  02/13/2016: 0.3 03/12/16: 0.8  IgG mg/dl 11/03/2014: 3150  12/08/2014:  759 02/14/2014: 860 09/11/2015: 1347 10/09/2015: 1457 11/06/2015: 1504 12/25/2015: 1400 02/13/2016: 1543 03/12/16: 1860  Kappa/lambda light chains levels and ration  11/03/14: 0.75, 152, 0.00 12/22/2014: 1.10, 2.60, 0.42 02/15/2015: 9.59,14.35, 0.67 09/11/2015: 2.44, 2.72, 0.90 10/09/2015: 3.09, 3.49, 0.89 11/06/2015: 2.30, 2.62, 0.88 12/25/2015: 2.51, 3.0, 0.84 02/13/2016: 2.64, 15.3, 0.17  24 h urine UPEP/IFE and light chain: 11/03/2014: IFE showed a monoclonal IgG heavy chain with associated lambda light chain. M protein was Undetectable   PATHOLOGY REPORT  Bone Marrow (BM) and Peripheral Blood (PB) FINAL PATHOLOGIC DIAGNOSIS  BONE MARROW: 07/11/2015 Hypocellular marrow (20%) with no increase in plasma cells (2%). See comment.  PERIPHERAL BLOOD: Mild anemia. No circulating plasma cells identified. See comment and CBC data.  COMMENT: The bone marrow core biopsy and clot  section are hypocellular for age (20%, 10-40% range). There is no increase in plasma cells, which is confirmed by CD138 immunohistochemistry. Myeloid  and erythroid elements are present in normal proportion. Megakaryocytes are morphologically unremarkable. There are no atypical lymphoid aggregates or granulomas.  The bone marrow aspirate smears are adequate for evaluation. Plasma cells are not increased in numbers (2%). There is no increase in blasts (<1%). Myeloid and erythroid elements are present in normal proportion with a M:E ratio of 2.5:1. There is no overt dysplasia of the myeloid, erythroid, or megakaryocytic lineages. There is no lymphocytosis.  Examination of peripheral blood reveals no circulating plasma cells or rouleaux formation. The background leukocytes are morphologically unremarkable and consist mostly of neutrophils.  Immunohistochemical controls worked appropriately.  BONE MARROW: 02/22/2016 Diagnosis Bone Marrow, Aspirate,Biopsy, and Clot, right iliac - NORMOCELLULAR BONE MARROW FOR AGE WITH TRILINEAGE HEMATOPOIESIS. - PLASMACYTOSIS (PLASMA CELLS 12%). - SEE COMMENT. PERIPHERAL BLOOD: - OCCASIONAL CIRCULATING PLASMA CELLS.   RADIOGRAPHIC STUDIES: I have personally reviewed the radiological images as listed and agreed with the findings in the report. No results found.   ASSESSMENT & PLAN: 46 y.o. [place] woman, presented with anemia and leokocytosis   1. 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 Norma Fredrickson recommendation to start maintenance chemotherapy with CyBorD every 2 weeks, for at least 4 months, she has been tolerating well overall,  this was changed to maintenance therapy to Velcade 1.3 mg/m every 2 weeks in 01/2016, indefinitely, until disease progression -Her M protein has been undetectable since her transplant, however it became positive again last week, and her IgG level and lambda free light chain level has significantly increased also. This is highly suspicious for recurrent plasma cell leukemia. The total white count is normal. The mild anemia has been stable lately. I discussed the above with patient -We previously discussed bone marrow biopsy results from 02/22/2016. Unfortunately this showed increased plasma cells 12% with additional lambda light chain staining pending. This is likely relapse of disease. Cytogenetics was normal  -I have restarted her on CyBorD every 2 weeks, will continue -continue acyclovir  -Continue Zometa every 4 weeks for a total of 2 years (until 11/2017) -she has had intermittent fever for the past 1 week, I am concerned the patient has ongoing infection, and will hold chemo today. We will restart on 3/16 if fever resolves  -I will follow up with Dr. Norma Fredrickson for her recent lab results (blood cultures, etc).  2 Fever  -She has had intermittent fever, some dysuria, mild sore throat, suspicious for infection of chemotherapy. -She is not neutropenic, and CEA elevated at 6.8K today -She had about culture done at Dr. Norma Fredrickson office earlier this week, I'll follow the results -I'll check her UA and urine culture -I called in Levaquin 750 mg daily for 7 days, she will start today  3. Type 2 diabetes, steroids induced  -She was noticed to have increased blood glucose level lately, with random blood glucose above 200. She also developed diabetic symptoms. No history of diabetes in the past. -This is likely steroids induced hyperglycemia -I strongly recommended her to avoid any soft drink and sweats, and watch her carbohydrate intake, and exercise more  -She has been seen by her primary care  physician. She was not given any medication to manage her glucose. I will call her primary  care physician to discuss adding metformin. -She is going to restart her dexamethasone as part of his leukemia treatment, I recommend her to take metformin 500 mg twice daily for 5 days after chemotherapy, to control her hyperglycemia. Potential side effects previously discussed, she agreed to proceed.  4. Chronic Hepatitis B  - She will follow-up GI clinic at [facility] -continue entecavir. The patient is requesting a refill of entecavir. Our nursing staff called her physician's office to request this for her.  5. GERD, recent GI bleeding and nausea  -Continue omeprazole daily. We discussed steroids can worsen her acid reflux. -She had an EGD at [facility] in 2017. There is no record of a colonoscopy at that time.  -she has seen Dr. Ardis Hughs, colonoscopy was scheduled   5. Nutrition -I encouraged the patient to drink nutritional supplement shakes like Ensure or Boost, especially when she is having trouble eating food.   Plan -I called and spoke with Dr. Norma Fredrickson, he is concerned about CNS relapse, will let me know if her CSF cytology is positive. -I will hold chemo today. I will give IV fluids with NS 1L -I will refill decadron today.  -I will order a urine culture today. Her US showed numerous WBC, possible UTI -I will prescribe Levaquin to take once a day for 7 days. -The patient will follow up on 3/16 for lab, flush, and chemo, as long as she has recovered from her fever.  All questions were answered. The patient knows to call the clinic with any problems, questions or concerns.  I spent 20 minutes counseling the patient face to face. The total time spent in the appointment was 25 minutes and more than 50% was on counseling.  This document serves as a record of services personally performed by Truitt Merle, MD. It was created on her behalf by Maryla Morrow, a trained medical scribe. The creation of  this record is based on the scribe's personal observations and the provider's statements to them. This document has been checked and approved by the attending provider.   Truitt Merle, MD 04/09/2016   Addendum I spoke with Dr. Norma Fredrickson, her CSF was negative, blood cultures were negative also Dr. Norma Fredrickson recommended adding Daratumumab for her disease control, will try to get her insurance approval.   Truitt Merle  04/14/2016

## 2016-04-09 ENCOUNTER — Encounter: Payer: Self-pay | Admitting: Hematology

## 2016-04-09 ENCOUNTER — Other Ambulatory Visit: Payer: Self-pay

## 2016-04-09 ENCOUNTER — Ambulatory Visit (HOSPITAL_BASED_OUTPATIENT_CLINIC_OR_DEPARTMENT_OTHER): Payer: BLUE CROSS/BLUE SHIELD | Admitting: Hematology

## 2016-04-09 ENCOUNTER — Ambulatory Visit (HOSPITAL_BASED_OUTPATIENT_CLINIC_OR_DEPARTMENT_OTHER): Payer: BLUE CROSS/BLUE SHIELD

## 2016-04-09 ENCOUNTER — Telehealth: Payer: Self-pay | Admitting: Hematology

## 2016-04-09 ENCOUNTER — Other Ambulatory Visit (HOSPITAL_BASED_OUTPATIENT_CLINIC_OR_DEPARTMENT_OTHER): Payer: BLUE CROSS/BLUE SHIELD

## 2016-04-09 ENCOUNTER — Ambulatory Visit: Payer: BLUE CROSS/BLUE SHIELD

## 2016-04-09 VITALS — BP 92/52 | HR 97 | Temp 98.1°F | Resp 18 | Ht 63.0 in | Wt 124.3 lb

## 2016-04-09 DIAGNOSIS — B181 Chronic viral hepatitis B without delta-agent: Secondary | ICD-10-CM

## 2016-04-09 DIAGNOSIS — K219 Gastro-esophageal reflux disease without esophagitis: Secondary | ICD-10-CM

## 2016-04-09 DIAGNOSIS — R070 Pain in throat: Secondary | ICD-10-CM | POA: Diagnosis not present

## 2016-04-09 DIAGNOSIS — R5383 Other fatigue: Secondary | ICD-10-CM

## 2016-04-09 DIAGNOSIS — E099 Drug or chemical induced diabetes mellitus without complications: Secondary | ICD-10-CM | POA: Diagnosis not present

## 2016-04-09 DIAGNOSIS — C9011 Plasma cell leukemia in remission: Secondary | ICD-10-CM

## 2016-04-09 DIAGNOSIS — R634 Abnormal weight loss: Secondary | ICD-10-CM

## 2016-04-09 DIAGNOSIS — R630 Anorexia: Secondary | ICD-10-CM

## 2016-04-09 DIAGNOSIS — R300 Dysuria: Secondary | ICD-10-CM

## 2016-04-09 DIAGNOSIS — C9012 Plasma cell leukemia in relapse: Secondary | ICD-10-CM

## 2016-04-09 DIAGNOSIS — Z95828 Presence of other vascular implants and grafts: Secondary | ICD-10-CM

## 2016-04-09 DIAGNOSIS — D649 Anemia, unspecified: Secondary | ICD-10-CM

## 2016-04-09 DIAGNOSIS — R509 Fever, unspecified: Secondary | ICD-10-CM

## 2016-04-09 DIAGNOSIS — C9010 Plasma cell leukemia not having achieved remission: Secondary | ICD-10-CM

## 2016-04-09 DIAGNOSIS — T380X5A Adverse effect of glucocorticoids and synthetic analogues, initial encounter: Secondary | ICD-10-CM

## 2016-04-09 LAB — URINALYSIS, MICROSCOPIC - CHCC
Bilirubin (Urine): NEGATIVE
Glucose: NEGATIVE mg/dL
Ketones: 5 mg/dL
Nitrite: NEGATIVE
Protein: 30 mg/dL
Specific Gravity, Urine: 1.01 (ref 1.003–1.035)
Urobilinogen, UR: 0.2 mg/dL (ref 0.2–1)
pH: 6 (ref 4.6–8.0)

## 2016-04-09 LAB — COMPREHENSIVE METABOLIC PANEL
ALT: 7 U/L (ref 0–55)
ANION GAP: 6 meq/L (ref 3–11)
AST: 26 U/L (ref 5–34)
Albumin: 2.5 g/dL — ABNORMAL LOW (ref 3.5–5.0)
Alkaline Phosphatase: 133 U/L (ref 40–150)
BUN: 10.5 mg/dL (ref 7.0–26.0)
CHLORIDE: 109 meq/L (ref 98–109)
CO2: 23 meq/L (ref 22–29)
CREATININE: 0.7 mg/dL (ref 0.6–1.1)
Calcium: 8.8 mg/dL (ref 8.4–10.4)
EGFR: >90 mL/min/{1.73_m2} (ref >90)
Glucose: 113 mg/dl (ref 70–140)
Potassium: 3.4 mEq/L — ABNORMAL LOW (ref 3.5–5.1)
Sodium: 139 mEq/L (ref 136–145)
Total Bilirubin: <0.22 mg/dL (ref 0.20–1.20)
Total Protein: 6.9 g/dL (ref 6.4–8.3)

## 2016-04-09 LAB — CBC WITH DIFFERENTIAL/PLATELET
BASO%: 0.2 % (ref 0.0–2.0)
Basophils Absolute: 0 10*3/uL (ref 0.0–0.1)
EOS%: 0.2 % (ref 0.0–7.0)
Eosinophils Absolute: 0 10*3/uL (ref 0.0–0.5)
HCT: 27.3 % — ABNORMAL LOW (ref 34.8–46.6)
HGB: 9 g/dL — ABNORMAL LOW (ref 11.6–15.9)
LYMPH%: 7.2 % — ABNORMAL LOW (ref 14.0–49.7)
MCH: 28.4 pg (ref 25.1–34.0)
MCHC: 33.1 g/dL (ref 31.5–36.0)
MCV: 85.6 fL (ref 79.5–101.0)
MONO#: 0.5 10*3/uL (ref 0.1–0.9)
MONO%: 6.1 % (ref 0.0–14.0)
NEUT#: 6.8 10*3/uL — ABNORMAL HIGH (ref 1.5–6.5)
NEUT%: 86.3 % — ABNORMAL HIGH (ref 38.4–76.8)
Platelets: 217 10*3/uL (ref 145–400)
RBC: 3.18 10*6/uL — ABNORMAL LOW (ref 3.70–5.45)
RDW: 16.2 % — ABNORMAL HIGH (ref 11.2–14.5)
WBC: 7.9 10*3/uL (ref 3.9–10.3)
lymph#: 0.6 10*3/uL — ABNORMAL LOW (ref 0.9–3.3)

## 2016-04-09 LAB — TECHNOLOGIST REVIEW

## 2016-04-09 MED ORDER — SODIUM CHLORIDE 0.9 % IJ SOLN
10.0000 mL | INTRAMUSCULAR | Status: DC | PRN
  Administered 2016-04-09: 10 mL via INTRAVENOUS
  Filled 2016-04-09: qty 10

## 2016-04-09 MED ORDER — LIDOCAINE-PRILOCAINE 2.5-2.5 % EX CREA: TOPICAL_CREAM | Freq: Once | CUTANEOUS | 0 refills | Status: AC

## 2016-04-09 MED ORDER — LEVOFLOXACIN 750 MG PO TABS: 750.0000 mg | ORAL_TABLET | Freq: Every day | ORAL | 0 refills | Status: DC

## 2016-04-09 MED ORDER — HEPARIN SOD (PORK) LOCK FLUSH 100 UNIT/ML IV SOLN
500.0000 [IU] | Freq: Once | INTRAVENOUS | Status: DC | PRN
  Filled 2016-04-09: qty 5

## 2016-04-09 MED ORDER — DEXAMETHASONE 4 MG PO TABS: 4.0000 mg | ORAL_TABLET | Freq: Two times a day (BID) | ORAL | 1 refills | Status: DC

## 2016-04-09 MED ORDER — SODIUM CHLORIDE 0.9 % IV SOLN
Freq: Once | INTRAVENOUS | Status: AC
  Administered 2016-04-09: 14:00:00 via INTRAVENOUS

## 2016-04-09 NOTE — Telephone Encounter (Signed)
Gave patient AVS and calender per 04/09/2016 los 

## 2016-04-09 NOTE — Patient Instructions (Signed)

## 2016-04-10 LAB — KAPPA/LAMBDA LIGHT CHAINS
IG LAMBDA FREE LIGHT CHAIN: 455.2 mg/L — AB (ref 5.7–26.3)
Ig Kappa Free Light Chain: 33.3 mg/L — ABNORMAL HIGH (ref 3.3–19.4)
Kappa/Lambda FluidC Ratio: 0.07 — ABNORMAL LOW (ref 0.26–1.65)

## 2016-04-11 LAB — URINE CULTURE

## 2016-04-14 LAB — MULTIPLE MYELOMA PANEL, SERUM
Albumin SerPl Elph-Mcnc: 2.5 g/dL — ABNORMAL LOW (ref 2.9–4.4)
Albumin/Glob SerPl: 0.7 (ref 0.7–1.7)
Alpha 1: 0.4 g/dL (ref 0.0–0.4)
Alpha2 Glob SerPl Elph-Mcnc: 1.2 g/dL — ABNORMAL HIGH (ref 0.4–1.0)
B-GLOBULIN SERPL ELPH-MCNC: 0.7 g/dL (ref 0.7–1.3)
GAMMA GLOB SERPL ELPH-MCNC: 1.6 g/dL (ref 0.4–1.8)
GLOBULIN, TOTAL: 3.9 g/dL (ref 2.2–3.9)
IgA, Qn, Serum: 83 mg/dL — ABNORMAL LOW (ref 87–352)
IgG, Qn, Serum: 1620 mg/dL — ABNORMAL HIGH (ref 700–1600)
IgM, Qn, Serum: 37 mg/dL (ref 26–217)
M PROTEIN SERPL ELPH-MCNC: 0.6 g/dL — AB
Total Protein: 6.4 g/dL (ref 6.0–8.5)

## 2016-04-14 NOTE — Progress Notes (Addendum)
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      10/10/2014 Miscellaneous    Peripheral blood chemistry and leukocytosis with total white count 78K, comprised of large plasma cells and his normocytic anemia. There is a myeloid left shift with previous surgical radium blasts. Flow cytometry showed 64% plasma cells      10/10/2014 Bone Marrow Biopsy    Markedly hypercellular marrow (95%), Atypical plasma cells comprise 57% of the cellularity. There was diminished multilineage in hematopoiesis with adequate maturation. Breasts less than 1%), no overt dysplasia of the myeloid or erythroid lineages.       10/10/2014 Initial Diagnosis    Plasma cell leukemia      10/13/2014 - 02/22/2015 Chemotherapy    CyborD (cytoxan 325m/m2 iv, bortezomib 1.5 mg/m, dexamethasone 40 mg, weekly every 28 days, bortezomib and dexamethasone was given twice weekly for 2 weeks during the first cycle)      04/04/2015 Bone Marrow Transplant    [facility] was complicated by sepsis from Escherichia coli bacteremia and associated colitis, she was discharged home on 04/27/2015.      05/07/2015 - 05/12/2015 Hospital Admission    [facility], [facility] nausea and abdominal pain. ID workup was negative, EGD showed evidence of gastritis and duodenitis, no H. pylori or CMV.      05/20/2015 - 05/24/2015 Hospital Ad[facility]    [facility]      07/11/2015 Bone Marrow Biopsy    Post transplant 100 a bone marrow biopsy showed hypocellular marrow, 20%, no increase in plasma cells (2%) or other abnormalities.      08/22/2015 - 01/02/2016 Chemotherapy    MaintenanceCyborD (cytoxan 3010mm2 iv, bortezomib 1.5 mg/m, dexamethasone 40 mg, every 2 weeks, changed to Velcade maintenance after 4 months treatment      01/16/2016 -  Chemotherapy    Maintenance Velcade 1.3 mg/m every 2 weeks      02/22/2016 Pathology Results    BONE MARROW: Diagnosis Bone Marrow, Aspirate,Biopsy, and Clot, right iliac - NORMOCELLULAR BONE MARROW FOR AGE WITH TRILINEAGE HEMATOPOIESIS. - PLASMACYTOSIS (PLASMA CELLS 12%). - SEE COMMENT. PERIPHERAL BLOOD: - OCCASIONAL CIRCULATING PLASMA CELLS.       HISTORY OF PRESENTING ILLNESS:  Sheryl [name] 46 years old. female female is here because of recently diagnosed plasma cell leukemia. [facility] and is here to establish her local oncological care [place] is accompanied to the clinic by her daughter and interpreter.  She presented to our hospital lth with worsening dyspnea, fatigue, cough, subjective fevers and chills, and was admitted on 09/14/2014. She was found to have allergy WBC 54.1K, hemoglobin 8.1, plt 310, she was treated with symptom management, and was discharged home on 09/17/2014, with a plan to follow-up with hematology. She represents to emergency room  on 10/07/2014, was found to have white count of 78K, and worsening anemia with hemoglobin 6. She was seen by my partner Dr. Jonette [name] and plasma cell leukemia was suspected. She was transferred to [facility] for further leukemia work-up and treatment. Bone marrow biopsy was done, which confirmed plasma cell leukemia, and she started  chemotherapy with Velcade, Cytoxan and dexamethasone. She received weekly dose twice, last dose on 10/20/2014. She was discharged to home afterwards. She had a mediport placed during her hospitalization.  She has moderate fatigue, low appetie, she lost about 13 lbs in the past few months. She has moderate abdominal pain, 5-6/10, persistent, she does not take any pain meds.   I reviewed her medical records extensively, and discussed her case with Dr. Jorje [name] and Sheryl [name] program coordinator.  CURRENT THERAPY:   1.Maintenance Velcade 1.50m/m2 every 2 weeks, started on 01/16/2016, changed back to CyBorD every 2 weeks on 02/27/2016, and changed to weekly on 04/18/2016 2. Daratumumab weekly started on 04/18/2016 3. zometa every 4 weeks started on 11/27/2015  INTERIM HISTORY: Sheryl [name] returns for follow up and ongoing treatment. She is doing well today. Her fever is gone, but she still has a cough with yellow sputum. She is still very tired, but is able to do some activities around the house. Denies chest pain, SOB, or any other concerns.   MEDICAL HISTORY:  Past Medical History:  Diagnosis Date  . Chills with fever    intermittently since d/c from hospital  . Dysuria-frequency syndrome    w/ pink urine  . GERD (gastroesophageal reflux disease)   . Hepatitis   . History of positive PPD    DX 2011--  CXR DONE NO EVIDENCE  . History of ureter stent   . Hydronephrosis, right   . Neuromuscular disorder (H[facility])    legs numb intermittently  . 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   . Pneumonia   . Right ureteral stone   . Urosepsis 8/14   [facility]    SURGICAL HISTORY: Past Surgical History:  Procedure Laterality Date  . CYSTOSCOPY W/ URETERAL STENT PLACEMENT Right 09/25/2012   Procedure: CYSTOSCOPY WITH RETROGRADE PYELOGRAM/URETERAL STENT PLACEMENT;  Surgeon: TAlexis Frock MD;  Location: [facility];  Service: Urology;  Laterality: Right;  . CYSTOSCOPY WITH RETROGRADE PYELOGRAM, URETEROSCOPY AND  STENT PLACEMENT Right 10/15/2012   Procedure: CYSTOSCOPY WITH RETROGRADE PYELOGRAM, URETEROSCOPY AND REMOVAL STENT WITH  STENT PLACEMENT;  Surgeon: TAlexis Frock MD;  [facility]  Service: Urology;  Laterality: Right;  . ESOPHAGOGASTRODUODENOSCOPY (EGD) WITH PROPOFOL N/A 11/16/2014   Procedure: ESOPHAGOGASTRODUODENOSCOPY (EGD) WITH PROPOFOL;  Surgeon: DMilus Banister MD;  Location: [facility];  Service: Endoscopy;  Laterality: N/A;  . HOLMIUM LASER APPLICATION Right 96/29/4765  Procedure: HOLMIUM LASER APPLICATION;  Surgeon: TAlexis Frock MD;  [facility]  Service: Urology;  Laterality: Right;  . LIVER BIOPSY    . OTHER SURGICAL HISTORY Right    removal of ovarian cyst  . removal of uterine cyst     years ago  . RIGHT VATS W/ DRAINAGE PEURAL EFFUSION AND BX'S  10-30-2008    SOCIAL HISTORY: Social History   Social History  . Marital status: Single    Spouse name: N/A  . Number of children: 3  . Years of education: N/A   Social History Main Topics  . Smoking status: Never Smoker  . Smokeless tobacco: Never Used  . Alcohol use No  . Drug use: No  . Sexual  activity: Not Currently    Birth control/ protection: Abstinence   Other Topics Concern  . None   Social History Narrative  . None    FAMILY HISTORY: Family History  Problem Relation Age of Onset  . Stomach cancer Mother   . Lung disease Father   . Asthma Father     ALLERGIES:  has No Known Allergies.  MEDICATIONS:  Current Outpatient Prescriptions  Medication Sig Dispense Refill  . acyclovir (ZOVIRAX) 800 MG tablet Take 1 tablet (800 mg total) by mouth 2 (two) times daily. 60 tablet 2  . dexamethasone (DECADRON) 4 MG tablet Take 1 tablet (4 mg total) by mouth 2 (two) times daily with a meal. 30 tablet 1  . docusate sodium (COLACE) 100 MG capsule Take 1 capsule (100 mg total) by mouth 2 (two) times daily. (Patient not taking: Reported on 04/09/2016) 60 capsule  1  . entecavir (BARACLUDE) 0.5 MG tablet Take 0.5 mg by mouth daily.    Marland Kitchen esomeprazole (NEXIUM) 40 MG capsule Take 1 capsule (40 mg total) by mouth daily at 12 noon. 30 capsule 2  . hydrocortisone (ANUSOL-HC) 2.5 % rectal cream Place 1 application rectally 2 (two) times daily. 30 g 0  . levofloxacin (LEVAQUIN) 750 MG tablet Take 1 tablet (750 mg total) by mouth daily. 7 tablet 0  . metFORMIN (GLUCOPHAGE) 500 MG tablet Take 1 tablet (500 mg total) by mouth 2 (two) times daily with a meal. 60 tablet 1  . omeprazole (PRILOSEC) 20 MG capsule Take 1 capsule (20 mg total) by mouth daily. 30 capsule 3  . ondansetron (ZOFRAN) 8 MG tablet Take 1 tablet (8 mg total) by mouth every 8 (eight) hours as needed for nausea or vomiting. 30 tablet 1  . polyethylene glycol powder (MIRALAX) powder Take 255 g by mouth daily. Take one capful mixed in water daily. (Patient not taking: Reported on 04/09/2016) 255 g 0  . potassium chloride SA (K-DUR,KLOR-CON) 20 MEQ tablet Take 1 tablet (20 mEq total) by mouth daily. Take 1 tablet ( 20 meq ) by mouth daily for 7 days;  Then as directed. (Patient not taking: Reported on 04/09/2016) 20 tablet 0   No current facility-administered medications for this visit.    Facility-Administered Medications Ordered in Other Visits  Medication Dose Route Frequency Provider Last Rate Last Dose  . sodium chloride 0.9 % injection 10 mL  10 mL Intravenous PRN Truitt Merle, MD   10 mL at 12/11/15 1532  . sodium chloride flush (NS) 0.9 % injection 10 mL  10 mL Intravenous PRN Truitt Merle, MD   10 mL at 10/09/15 1717    REVIEW OF SYSTEMS:   Constitutional: no chills or abnormal night sweats (+) fatigue Eyes: Denies blurriness of vision, double vision or watery eyes Ears, nose, mouth, throat, and face: Denies mucositis or sore throat Respiratory: Denies cough, dyspnea or wheezes (+) cough (+) sputum production, yellow in color Cardiovascular: Denies palpitation, chest discomfort or lower extremity  swelling Gastrointestinal:  Denies nausea, heartburn  Skin: Denies abnormal skin rashes Lymphatics: Denies new lymphadenopathy or easy bruising Neurological:Denies numbness, tingling or new weaknesses Behavioral/Psych: Mood is stable, no new changes  All other systems were reviewed with the patient and are negative.  PHYSICAL EXAMINATION: ECOG PERFORMANCE STATUS: 1  BP (!) 95/54 (BP Location: Left Arm, Patient Position: Sitting)   Pulse (!) 102   Temp 98.1 F (36.7 C) (Oral)   Resp 18   SpO2 100% Comment: RA   GENERAL:alert,  no distress and comfortable SKIN: skin color, texture, turgor are normal, no rashes or significant lesions EYES: normal, conjunctiva are pink and non-injected, sclera clear OROPHARYNX:no exudate, no erythema and lips, buccal mucosa NECK: supple, thyroid normal size, non-tender, without nodularity LYMPH:  no palpable lymphadenopathy in the cervical, axillary or inguinal LUNGS: clear to auscultation and percussion with normal breathing effort HEART: regular rate & rhythm and no murmurs and no lower extremity edema ABDOMEN:abdomen soft, non-tender and normal bowel sounds Musculoskeletal:no cyanosis of digits and no clubbing  PSYCH: alert & oriented x 3 with fluent speech NEURO: no focal motor/sensory deficits  LABORATORY DATA:  I have reviewed the data as listed CBC Latest Ref Rng & Units 04/09/2016 03/31/2016 03/26/2016  WBC 3.9 - 10.3 10e3/uL 7.9 7.6 5.4  Hemoglobin 11.6 - 15.9 g/dL 9.0(L) 10.0(L) 9.5(L)  Hematocrit 34.8 - 46.6 % 27.3(L) 31.2(L) 28.4(L)  Platelets 145 - 400 10e3/uL 217 229 224    CMP Latest Ref Rng & Units 04/09/2016 04/09/2016 03/31/2016  Glucose 70 - 140 mg/dl 113 - 91  BUN 7.0 - 26.0 mg/dL 10.5 - 10  Creatinine 0.6 - 1.1 mg/dL 0.7 - 0.58  Sodium 136 - 145 mEq/L 139 - 138  Potassium 3.5 - 5.1 mEq/L 3.4(L) - 3.2(L)  Chloride 101 - 111 mmol/L - - 108  CO2 22 - 29 mEq/L 23 - 24  Calcium 8.4 - 10.4 mg/dL 8.8 - 8.7(L)  Total Protein 6.4 - 8.3  g/dL 6.9 6.4 7.1  Total Bilirubin 0.20 - 1.20 mg/dL <0.22 - 0.4  Alkaline Phos 40 - 150 U/L 133 - 68  AST 5 - 34 U/L 26 - 22  ALT 0 - 55 U/L 7 - 8(L)    SPEP M-protein  09/15/2014: 4.2 10/08/14: 4.6 12/08/2014: 0.2 02/15/2015: not sufficient sample for test   09/11/2015: not observed 10/09/2015: not det   11/06/2015: not det  12/25/2015: not det  02/13/2016: 0.3 03/12/2016: 0.8 04/09/2016: 0.6   IgG mg/dl 11/03/2014: 3150  12/08/2014:  759 02/14/2014: 860 09/11/2015: 1347 10/09/2015: 1457 11/06/2015: 1504 12/25/2015: 1400 02/13/2016: 1543 03/12/2016: 1860 04/09/2016: 1620  Kappa/lambda light chains levels and ration  11/03/14: 0.75, 152, 0.00 12/22/2014: 1.10, 2.60, 0.42 02/15/2015: 9.59,14.35, 0.67 09/11/2015: 2.44, 2.72, 0.90 10/09/2015: 3.09, 3.49, 0.89 11/06/2015: 2.30, 2.62, 0.88 12/25/2015: 2.51, 3.0, 0.84 02/13/2016: 2.64, 15.3, 0.17 03/12/2016: 2.27, 45.5, 0.05 04/09/2016: 3.33, 45.5, 0.07  24 h urine UPEP/IFE and light chain: 11/03/2014: IFE showed a monoclonal IgG heavy chain with associated lambda light chain. M protein was Undetectable    PATHOLOGY REPORT  Bone Marrow (BM) and Peripheral Blood (PB) FINAL PATHOLOGIC DIAGNOSIS  BONE MARROW: 07/11/2015 Hypocellular marrow (20%) with no increase in plasma cells (2%). See comment.  PERIPHERAL BLOOD: Mild anemia. No circulating plasma cells identified. See comment and CBC data.  COMMENT: The bone marrow core biopsy and clot section are hypocellular for age (20%, 10-40% range). There is no increase in plasma cells, which is confirmed by CD138 immunohistochemistry. Myeloid and erythroid elements are present in normal proportion. Megakaryocytes are morphologically unremarkable. There are no atypical lymphoid aggregates or granulomas.  The bone marrow aspirate smears are adequate for evaluation. Plasma cells are not increased in numbers (2%). There is no increase in blasts (<1%). Myeloid and erythroid elements are present in normal  proportion with a M:E ratio of 2.5:1. There is no overt dysplasia of the myeloid, erythroid, or megakaryocytic lineages. There is no lymphocytosis.  Examination of peripheral blood reveals no circulating plasma cells or rouleaux  formation. The background leukocytes are morphologically unremarkable and consist mostly of neutrophils.  Immunohistochemical controls worked appropriately.  BONE MARROW: 02/22/2016 Diagnosis Bone Marrow, Aspirate,Biopsy, and Clot, right iliac - NORMOCELLULAR BONE MARROW FOR AGE WITH TRILINEAGE HEMATOPOIESIS. - PLASMACYTOSIS (PLASMA CELLS 12%). - SEE COMMENT. PERIPHERAL BLOOD: - OCCASIONAL CIRCULATING PLASMA CELLS. Diagnosis Note The bone marrow is generally normocellular for age with trilineage hematopoiesis and nonspecific myeloid changes likely related to previous therapy. In this background, the plasma cells are increased in number representing 12% of all cells associated with occasional small clusters and atypical cytomorphologic features. Immunohistochemical stains highlight the increased plasma cell component in the marrow but kappa and lambda staining is not entirely contributory with only possible weak lambda light chain excess. Nonetheless, the overall features favor recurrent/residual plasma cell neoplasm. Correlation with cytogenetic and FISH studies is recommended. (BNS:kh/gt 02-25-16)  RADIOGRAPHIC STUDIES: I have personally reviewed the radiological images as listed and agreed with the findings in the report. No results found.  ASSESSMENT & PLAN: 46 y.o. [place] woman, presented with anemia and leokocytosis   1. 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 Norma Fredrickson recommendation to start maintenance chemotherapy with CyBorD every 2 weeks, for at least 4 months, she has been tolerating well overall, this was changed to maintenance therapy to Velcade 1.3 mg/m every 2 weeks in 01/2016, indefinitely, until disease progression -Her M protein has been undetectable since her transplant, however it became positive again last week, and her IgG level and lambda free light chain level has significantly increased also. This is highly suspicious for recurrent plasma cell leukemia. The total white count is normal. The mild anemia has been stable lately. I discussed the above with patient -We previously discussed bone marrow biopsy results from 02/22/2016. Unfortunately this showed increased plasma cells 12% with additional lambda light chain staining pending. This is relapse of disease. Cytogenetics was normal  -Her M protein and lambda free light chain level has significantly increased lately, consistent with disease relapse -Her recent LP showed negative CSF, no evidence of CNS involvement. -I have spoken with Dr. Norma Fredrickson, we agreed to change her CyBorD to weekly, and add daratumumab weekly, starting today. Potential side effects would discussed with patient, especially cytopenia and infusion reaction, she will take dexamethasone the day before Dara infusion.  -If her disease does not respond to treatment quickly, or if she has intolerance issue to cyBorD, I will change to pomalidomide,  dexamethasone and Dara  -continue acyclovir  -Continue Zometa every 4 weeks for a total of 2 years (until 11/2017) -I reviewed the labs with the patient in detail, adequate for treatment today. Will proceed with chemo and Dara  2. Type 2 diabetes, steroids induced  -She was noticed to have increased blood glucose level lately, with random blood glucose above 200. She also developed diabetic symptoms. No history of diabetes in the past. -This is likely  steroids induced hyperglycemia -I strongly recommend her to avoid any soft drink and sweats, and watch her carbohydrate intake, and exercise more  -She has been seen by her primary care physician. She was not given any medication to manage her glucose. I will call her primary care physician to discuss adding metformin. -  I instructed her to take metformin every day since she is now taking dexamethasone more frequently.  -She is going to restart her dexamethasone weekly as part of his leukemia treatment, and she will also receive steroids as premedication, I recommend her to take metformin 500 mg twice daily continuously, to control her hyperglycemia. Potential side effects discussed, she agrees to proceed.  3. Chronic Hepatitis B  - She will follow-up GI clinic at [facility] -continue entecavir. The patient is requesting a refill of entecavir. Our nursing staff will call her physician's office to request this for her.  4. GERD, recent GI bleeding and nausea  -Continue omeprazole daily. We discussed steroids can worsen her acid reflux. -She had an EGD at [facility] in 2017.   5. Recent UTI -She has completed a course of Augmentin, fever resolved, she feels much better.  Plan -Lab reviewed, will continue treatment CyBorD and Dara today and every week, dexamethasone the day before each treatment.  -She would like all of her appointments moved to Thursdays. -Refill dexamethasone at next visit.  -Zometa due next week -I will see her back in 3 weeks. She will see my NP next week before treatment.    All questions were answered. The patient knows to call the clinic with any problems, questions or concerns.  I spent 20 minutes counseling the patient face to face. We used the vedio interpreter service. The total time spent in the appointment was 25 minutes and more than 50% was on counseling.  This document serves as a record of services personally performed by Truitt Merle, MD. It was created on her  behalf by Martinique Casey, a trained medical scribe. The creation of this record is based on the scribe's personal observations and the provider's statements to them. This document has been checked and approved by the attending provider.  I have reviewed the above documentation for accuracy and completeness and I agree with the above.   Truitt Merle, MD 04/18/2016   Addendum Pt had infusion reaction to Dara (chills and flushing, stable VS), resolved after meds, restart Dara at 50% rate, and she tolerated well. Unfortunately she could not finish the infusion before our infusion room close today.   I called in singulair 11m she will take 1 tab the night before infusion She is instructed to take dexa 23mdaily for 2 days before chemo infusion   FeTruitt Merle3/16/2018

## 2016-04-15 ENCOUNTER — Encounter: Payer: Self-pay | Admitting: Gastroenterology

## 2016-04-18 ENCOUNTER — Ambulatory Visit: Payer: BLUE CROSS/BLUE SHIELD

## 2016-04-18 ENCOUNTER — Ambulatory Visit (HOSPITAL_COMMUNITY)
Admission: RE | Admit: 2016-04-18 | Discharge: 2016-04-18 | Disposition: A | Discharge status: Home / Self Care | Payer: BLUE CROSS/BLUE SHIELD | Source: Ambulatory Visit | Attending: Hematology | Admitting: Hematology

## 2016-04-18 ENCOUNTER — Encounter: Payer: Self-pay | Admitting: Nurse Practitioner

## 2016-04-18 ENCOUNTER — Ambulatory Visit: Payer: BLUE CROSS/BLUE SHIELD | Admitting: Nurse Practitioner

## 2016-04-18 ENCOUNTER — Other Ambulatory Visit: Payer: Self-pay | Admitting: *Deleted

## 2016-04-18 ENCOUNTER — Ambulatory Visit (HOSPITAL_BASED_OUTPATIENT_CLINIC_OR_DEPARTMENT_OTHER): Payer: BLUE CROSS/BLUE SHIELD | Admitting: Hematology

## 2016-04-18 ENCOUNTER — Other Ambulatory Visit (HOSPITAL_BASED_OUTPATIENT_CLINIC_OR_DEPARTMENT_OTHER): Payer: BLUE CROSS/BLUE SHIELD

## 2016-04-18 ENCOUNTER — Encounter: Payer: Self-pay | Admitting: Hematology

## 2016-04-18 ENCOUNTER — Ambulatory Visit (HOSPITAL_BASED_OUTPATIENT_CLINIC_OR_DEPARTMENT_OTHER): Payer: BLUE CROSS/BLUE SHIELD

## 2016-04-18 VITALS — BP 96/58 | HR 90 | Temp 98.5°F | Resp 18

## 2016-04-18 VITALS — BP 95/54 | HR 102 | Temp 98.1°F | Resp 18

## 2016-04-18 DIAGNOSIS — C9012 Plasma cell leukemia in relapse: Secondary | ICD-10-CM | POA: Diagnosis not present

## 2016-04-18 DIAGNOSIS — Z5111 Encounter for antineoplastic chemotherapy: Secondary | ICD-10-CM

## 2016-04-18 DIAGNOSIS — E099 Drug or chemical induced diabetes mellitus without complications: Secondary | ICD-10-CM

## 2016-04-18 DIAGNOSIS — C9010 Plasma cell leukemia not having achieved remission: Secondary | ICD-10-CM

## 2016-04-18 DIAGNOSIS — C9011 Plasma cell leukemia in remission: Secondary | ICD-10-CM

## 2016-04-18 DIAGNOSIS — Z5112 Encounter for antineoplastic immunotherapy: Secondary | ICD-10-CM

## 2016-04-18 DIAGNOSIS — B181 Chronic viral hepatitis B without delta-agent: Secondary | ICD-10-CM | POA: Diagnosis not present

## 2016-04-18 DIAGNOSIS — T380X5A Adverse effect of glucocorticoids and synthetic analogues, initial encounter: Secondary | ICD-10-CM

## 2016-04-18 DIAGNOSIS — T7840XA Allergy, unspecified, initial encounter: Secondary | ICD-10-CM

## 2016-04-18 LAB — COMPREHENSIVE METABOLIC PANEL
ALT: 6 U/L (ref 0–55)
AST: 26 U/L (ref 5–34)
Albumin: 2.9 g/dL — ABNORMAL LOW (ref 3.5–5.0)
Alkaline Phosphatase: 122 U/L (ref 40–150)
Anion Gap: 8 mEq/L (ref 3–11)
BUN: 13.8 mg/dL (ref 7.0–26.0)
CALCIUM: 9.3 mg/dL (ref 8.4–10.4)
CHLORIDE: 107 meq/L (ref 98–109)
CO2: 24 meq/L (ref 22–29)
CREATININE: 0.7 mg/dL (ref 0.6–1.1)
EGFR: >90 mL/min/{1.73_m2} (ref >90)
GLUCOSE: 116 mg/dL (ref 70–140)
POTASSIUM: 3.7 meq/L (ref 3.5–5.1)
SODIUM: 140 meq/L (ref 136–145)
Total Bilirubin: 0.22 mg/dL (ref 0.20–1.20)
Total Protein: 7.3 g/dL (ref 6.4–8.3)

## 2016-04-18 LAB — CBC WITH DIFFERENTIAL/PLATELET
BASO%: 0.4 % (ref 0.0–2.0)
BASOS ABS: 0 10*3/uL (ref 0.0–0.1)
EOS%: 1 % (ref 0.0–7.0)
Eosinophils Absolute: 0.1 10*3/uL (ref 0.0–0.5)
HEMATOCRIT: 29.1 % — AB (ref 34.8–46.6)
HGB: 9.5 g/dL — ABNORMAL LOW (ref 11.6–15.9)
LYMPH#: 0.9 10*3/uL (ref 0.9–3.3)
LYMPH%: 11.1 % — ABNORMAL LOW (ref 14.0–49.7)
MCH: 28.8 pg (ref 25.1–34.0)
MCHC: 32.5 g/dL (ref 31.5–36.0)
MCV: 88.5 fL (ref 79.5–101.0)
MONO#: 1.1 10*3/uL — ABNORMAL HIGH (ref 0.1–0.9)
MONO%: 13.3 % (ref 0.0–14.0)
NEUT#: 6 10*3/uL (ref 1.5–6.5)
NEUT%: 74.2 % (ref 38.4–76.8)
Platelets: 285 10*3/uL (ref 145–400)
RBC: 3.29 10*6/uL — ABNORMAL LOW (ref 3.70–5.45)
RDW: 17.7 % — ABNORMAL HIGH (ref 11.2–14.5)
WBC: 8.1 10*3/uL (ref 3.9–10.3)

## 2016-04-18 MED ORDER — PROCHLORPERAZINE MALEATE 10 MG PO TABS
10.0000 mg | ORAL_TABLET | Freq: Once | ORAL | Status: AC
  Administered 2016-04-18: 10 mg via ORAL

## 2016-04-18 MED ORDER — DEXAMETHASONE SODIUM PHOSPHATE 10 MG/ML IJ SOLN
INTRAMUSCULAR | Status: AC
  Filled 2016-04-18: qty 1

## 2016-04-18 MED ORDER — DIPHENHYDRAMINE HCL 50 MG/ML IJ SOLN
25.0000 mg | Freq: Once | INTRAMUSCULAR | Status: AC
  Administered 2016-04-18: 25 mg via INTRAVENOUS

## 2016-04-18 MED ORDER — MONTELUKAST SODIUM 10 MG PO TABS: 10.0000 mg | ORAL_TABLET | ORAL | 1 refills | Status: DC

## 2016-04-18 MED ORDER — MEPERIDINE HCL 25 MG/ML IJ SOLN
12.5000 mg | Freq: Once | INTRAMUSCULAR | Status: AC
  Administered 2016-04-18: 12.5 mg via INTRAVENOUS

## 2016-04-18 MED ORDER — HEPARIN SOD (PORK) LOCK FLUSH 100 UNIT/ML IV SOLN
500.0000 [IU] | Freq: Once | INTRAVENOUS | Status: AC | PRN
  Administered 2016-04-18: 500 [IU]
  Filled 2016-04-18: qty 5

## 2016-04-18 MED ORDER — SODIUM CHLORIDE 0.9 % IV SOLN
16.0000 mg/kg | Freq: Once | INTRAVENOUS | Status: AC
  Administered 2016-04-18: 900 mg via INTRAVENOUS
  Filled 2016-04-18: qty 40

## 2016-04-18 MED ORDER — DEXAMETHASONE SODIUM PHOSPHATE 10 MG/ML IJ SOLN
10.0000 mg | Freq: Once | INTRAMUSCULAR | Status: AC
  Administered 2016-04-18: 10 mg via INTRAVENOUS

## 2016-04-18 MED ORDER — ACYCLOVIR 800 MG PO TABS: 800.0000 mg | ORAL_TABLET | Freq: Two times a day (BID) | ORAL | 2 refills | Status: DC

## 2016-04-18 MED ORDER — MONTELUKAST SODIUM 10 MG PO TABS
10.0000 mg | ORAL_TABLET | Freq: Once | ORAL | Status: AC
  Administered 2016-04-18: 10 mg via ORAL

## 2016-04-18 MED ORDER — SODIUM CHLORIDE 0.9% FLUSH
10.0000 mL | INTRAVENOUS | Status: DC | PRN
  Administered 2016-04-18: 10 mL
  Filled 2016-04-18: qty 10

## 2016-04-18 MED ORDER — ACETAMINOPHEN 325 MG PO TABS
650.0000 mg | ORAL_TABLET | Freq: Once | ORAL | Status: AC
  Administered 2016-04-18: 650 mg via ORAL

## 2016-04-18 MED ORDER — OMEPRAZOLE 20 MG PO CPDR: 20.0000 mg | DELAYED_RELEASE_CAPSULE | Freq: Every day | ORAL | 3 refills | Status: DC

## 2016-04-18 MED ORDER — MEPERIDINE HCL 50 MG/ML IJ SOLN
INTRAMUSCULAR | Status: AC
  Filled 2016-04-18: qty 1

## 2016-04-18 MED ORDER — BORTEZOMIB CHEMO SQ INJECTION 3.5 MG (2.5MG/ML)
1.5000 mg/m2 | Freq: Once | INTRAMUSCULAR | Status: AC
  Administered 2016-04-18: 2.5 mg via SUBCUTANEOUS
  Filled 2016-04-18: qty 2.5

## 2016-04-18 MED ORDER — SODIUM CHLORIDE 0.9 % IV SOLN
Freq: Once | INTRAVENOUS | Status: AC
  Administered 2016-04-18: 10:00:00 via INTRAVENOUS

## 2016-04-18 MED ORDER — SODIUM CHLORIDE 0.9 % IV SOLN: Freq: Once | INTRAVENOUS | Status: DC

## 2016-04-18 MED ORDER — PALONOSETRON HCL INJECTION 0.25 MG/5ML
0.2500 mg | Freq: Once | INTRAVENOUS | Status: AC
  Administered 2016-04-18: 0.25 mg via INTRAVENOUS

## 2016-04-18 MED ORDER — DIPHENHYDRAMINE HCL 25 MG PO CAPS
50.0000 mg | ORAL_CAPSULE | Freq: Once | ORAL | Status: AC
  Administered 2016-04-18: 50 mg via ORAL

## 2016-04-18 MED ORDER — METHYLPREDNISOLONE SODIUM SUCC 125 MG IJ SOLR
125.0000 mg | Freq: Once | INTRAMUSCULAR | Status: AC
  Administered 2016-04-18: 125 mg via INTRAVENOUS

## 2016-04-18 MED ORDER — PALONOSETRON HCL INJECTION 0.25 MG/5ML
INTRAVENOUS | Status: AC
  Filled 2016-04-18: qty 5

## 2016-04-18 MED ORDER — DIPHENHYDRAMINE HCL 25 MG PO CAPS
ORAL_CAPSULE | ORAL | Status: AC
  Filled 2016-04-18: qty 2

## 2016-04-18 MED ORDER — ACETAMINOPHEN 325 MG PO TABS
ORAL_TABLET | ORAL | Status: AC
  Filled 2016-04-18: qty 2

## 2016-04-18 MED ORDER — CYCLOPHOSPHAMIDE CHEMO INJECTION 1 GM
300.0000 mg/m2 | Freq: Once | INTRAMUSCULAR | Status: AC
  Administered 2016-04-18: 480 mg via INTRAVENOUS
  Filled 2016-04-18: qty 24

## 2016-04-18 MED ORDER — FAMOTIDINE IN NACL 20-0.9 MG/50ML-% IV SOLN
20.0000 mg | Freq: Once | INTRAVENOUS | Status: AC | PRN
  Administered 2016-04-18: 20 mg via INTRAVENOUS

## 2016-04-18 MED ORDER — METHYLPREDNISOLONE SODIUM SUCC 125 MG IJ SOLR
INTRAMUSCULAR | Status: AC
  Filled 2016-04-18: qty 2

## 2016-04-18 MED ORDER — PROCHLORPERAZINE MALEATE 10 MG PO TABS
ORAL_TABLET | ORAL | Status: AC
  Filled 2016-04-18: qty 1

## 2016-04-18 MED ORDER — MONTELUKAST SODIUM 10 MG PO TABS
ORAL_TABLET | ORAL | Status: AC
  Filled 2016-04-18: qty 1

## 2016-04-18 MED ORDER — DIPHENHYDRAMINE HCL 50 MG/ML IJ SOLN
INTRAMUSCULAR | Status: AC
  Filled 2016-04-18: qty 1

## 2016-04-18 MED ORDER — METHYLPREDNISOLONE SODIUM SUCC 125 MG IJ SOLR
60.0000 mg | Freq: Once | INTRAMUSCULAR | Status: AC
  Administered 2016-04-18: 60 mg via INTRAVENOUS

## 2016-04-18 NOTE — Patient Instructions (Signed)
[facility] Discharge Instructions for Patients Receiving Chemotherapy  Today you received the following chemotherapy agents:  Cytoxan (cyclophosphamide), Velcade (bortezomib), Darzalex (daratumumab)  To help prevent nausea and vomiting after your treatment, we encourage you to take your nausea medication as prescribed.   If you develop nausea and vomiting that is not controlled by your nausea medication, call the clinic.   BELOW ARE SYMPTOMS THAT SHOULD BE REPORTED IMMEDIATELY:  *FEVER GREATER THAN 100.5 F  *CHILLS WITH OR WITHOUT FEVER  NAUSEA AND VOMITING THAT IS NOT CONTROLLED WITH YOUR NAUSEA MEDICATION  *UNUSUAL SHORTNESS OF BREATH  *UNUSUAL BRUISING OR BLEEDING  TENDERNESS IN MOUTH AND THROAT WITH OR WITHOUT PRESENCE OF ULCERS  *URINARY PROBLEMS  *BOWEL PROBLEMS  UNUSUAL RASH Items with * indicate a potential emergency and should be followed up as soon as possible.  Feel free to call the clinic you have any questions or concerns. The clinic phone number is [phone].  Please show the [facility] at check-in to the Emergency Department and triage nurse.     Daratumumab injection What is this medicine? DARATUMUMAB (dar a toom ue mab) is a monoclonal antibody. It is used to treat multiple myeloma. This medicine may be used for other purposes; ask your health care provider or pharmacist if you have questions. COMMON BRAND NAME(S): DARZALEX What should I tell my health care provider before I take this medicine? They need to know if you have any of these conditions: -infection (especially a virus infection such as chickenpox, cold sores, or herpes) -lung or breathing disease -pregnant or trying to get pregnant -breast-feeding -an unusual or allergic reaction to daratumumab, other medicines, foods, dyes, or preservatives How should I use this medicine? This medicine is for infusion into a vein. It is given by a health care professional in a  hospital or clinic setting. Talk to your pediatrician regarding the use of this medicine in children. Special care may be needed. Overdosage: If you think you have taken too much of this medicine contact a poison control center or emergency room at once. NOTE: This medicine is only for you. Do not share this medicine with others. What if I miss a dose? Keep appointments for follow-up doses as directed. It is important not to miss your dose. Call your doctor or health care professional if you are unable to keep an appointment. What may interact with this medicine? Interactions have not been studied. Give your health care provider a list of all the medicines, herbs, non-prescription drugs, or dietary supplements you use. Also tell them if you smoke, drink alcohol, or use illegal drugs. Some items may interact with your medicine. This list may not describe all possible interactions. Give your health care provider a list of all the medicines, herbs, non-prescription drugs, or dietary supplements you use. Also tell them if you smoke, drink alcohol, or use illegal drugs. Some items may interact with your medicine. What should I watch for while using this medicine? This drug may make you feel generally unwell. Report any side effects. Continue your course of treatment even though you feel ill unless your doctor tells you to stop. This medicine can cause serious allergic reactions. To reduce your risk you may need to take medicine before treatment with this medicine. Take your medicine as directed. This medicine can affect the results of blood tests to match your blood type. These changes can last for up to 6 months after the final dose. Your healthcare provider will  do blood tests to match your blood type before you start treatment. Tell all of your healthcare providers that you are being treated with this medicine before receiving a blood transfusion. This medicine can affect the results of some tests used  to determine treatment response; extra tests may be needed to evaluate response. Do not become pregnant while taking this medicine or for 3 months after stopping it. Women should inform their doctor if they wish to become pregnant or think they might be pregnant. There is a potential for serious side effects to an unborn child. Talk to your health care professional or pharmacist for more information. What side effects may I notice from receiving this medicine? Side effects that you should report to your doctor or health care professional as soon as possible: -allergic reactions like skin rash, itching or hives, swelling of the face, lips, or tongue -breathing problems -chills -cough -dizziness -feeling faint or lightheaded -headache -low blood counts - this medicine may decrease the number of white blood cells, red blood cells and platelets. You may be at increased risk for infections and bleeding. -nausea, vomiting -shortness of breath -signs of decreased platelets or bleeding - bruising, pinpoint red spots on the skin, black, tarry stools, blood in the urine -signs of decreased red blood cells - unusually weak or tired, feeling faint or lightheaded, falls -signs of infection - fever or chills, cough, sore throat, pain or difficulty passing urine Side effects that usually do not require medical attention (report to your doctor or health care professional if they continue or are bothersome): -back pain -diarrhea -muscle cramps -pain, tingling, numbness in the hands or feet -swelling of the ankles, feet, hands -tiredness This list may not describe all possible side effects. Call your doctor for medical advice about side effects. You may report side effects to FDA at 1-800-FDA-1088. Where should I keep my medicine? Keep out of the reach of children. This drug is given in a hospital or clinic and will not be stored at home. NOTE: This sheet is a summary. It may not cover all possible  information. If you have questions about this medicine, talk to your doctor, pharmacist, or health care provider.  2018 Elsevier/Gold Standard (2015-02-22 10:38:11)

## 2016-04-18 NOTE — Assessment & Plan Note (Signed)
Patient presented to the [facility] today to receive her first cycle of daratumumab infusion and her 4th cycle of CyBorD.  Patient developed a headache, flushing, and rigors.  Infusion was held; and patient was given Pepcid 20 mg, and femoral 12.5 mg.  Confirmed the patient was given Decadron, SciMed 125 mg, and Benadryl 50 mg at approximate 1045 this morning.  Dr. Burr Medico in to see pt as well.  She advised given the patient an additional Benadryl 25 mg as well as Medrol 60 mg.  All symptoms resolved; patient was able to complete her treatment as directed.

## 2016-04-18 NOTE — Addendum Note (Signed)
Addended by: Truitt Merle on: 04/18/2016 10:24 AM   Modules accepted: Orders

## 2016-04-18 NOTE — Addendum Note (Signed)
Addended by: Truitt Merle on: 04/18/2016 05:19 PM   Modules accepted: Orders

## 2016-04-18 NOTE — Progress Notes (Signed)
SYMPTOM MANAGEMENT CLINIC    Chief Complaint: Hypersensitivity reaction  HPI:  Sheryl [name] 46 y.o. female diagnosed with leukemia.  Currently undergoing CyBorD and daratumumab.      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HCC)   10/07/2014 Imaging    Abdominal ultrasound showed mild splenomegaly, stable perisplenic complex fluid collection unchanged since 08/27/2010.      10/10/2014 Miscellaneous    Peripheral blood chemistry and leukocytosis with total white count 78K, comprised of large plasma cells and his normocytic anemia. There is a myeloid left shift with previous surgical radium blasts. Flow cytometry showed 64% plasma cells      10/10/2014 Bone Marrow Biopsy    Markedly hypercellular marrow (95%), Atypical plasma cells comprise 57% of the cellularity. There was diminished multilineage in hematopoiesis with adequate maturation. Breasts less than 1%), no overt dysplasia of the myeloid or erythroid lineages.       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      05/07/2015 - 05/12/2015 Hospital Admission    [facility], [facility] and [facility] pain. ID workup was negative, EGD showed evidence of gastritis and duodenitis, no H. pylori or CMV.      05/20/2015 - 05/24/2015 Hospital Admission    [facility]      07/11/2015 Bone Marrow Biopsy    Post transplant 100 a bone marrow biopsy showed hypocellular marrow, 20%, no increase in  plasma cells (2%) or other abnormalities.      08/22/2015 - 01/02/2016 Chemotherapy    MaintenanceCyborD (cytoxan 3059mm2 iv, bortezomib 1.5 mg/m, dexamethasone 40 mg, every 2 weeks, changed to Velcade maintenance after 4 months treatment      01/16/2016 -  Chemotherapy    Maintenance Velcade 1.3 mg/m every 2 weeks      02/22/2016 Pathology Results    BONE MARROW: Diagnosis Bone Marrow, Aspirate,Biopsy, and Clot, right iliac - NORMOCELLULAR BONE MARROW FOR AGE WITH TRILINEAGE HEMATOPOIESIS. - PLASMACYTOSIS (PLASMA CELLS 12%). - SEE COMMENT. PERIPHERAL BLOOD: - OCCASIONAL CIRCULATING PLASMA CELLS.       Review of Systems  Constitutional: Positive for chills.  Neurological: Positive for headaches.  All other systems reviewed and are negative.   Past Medical History:  Diagnosis Date  . Chills with fever    intermittently since d/c from hospital  . Dysuria-frequency syndrome    w/ pink urine  . GERD (gastroesophageal reflux disease)   . Hepatitis   . History of positive PPD    DX 2011--  CXR DONE NO EVIDENCE  . History of ureter stent   . Hydronephrosis, right   . Neuromuscular disorder (HCC)    legs numb intermittently  . 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  . Pneumonia   . Right ureteral stone   . Urosepsis 8/14   admitted to [facility]    Past Surgical History:  Procedure Laterality Date  . CYSTOSCOPY W/ URETERAL STENT PLACEMENT Right 09/25/2012   Procedure: CYSTOSCOPY WITH RETROGRADE PYELOGRAM/URETERAL  STENT PLACEMENT;  Surgeon: Alexis Frock, MD;  Location: [facility];  Service: Urology;  Laterality: Right;  . CYSTOSCOPY WITH RETROGRADE PYELOGRAM, URETEROSCOPY AND STENT PLACEMENT Right 10/15/2012   Procedure: CYSTOSCOPY WITH RETROGRADE PYELOGRAM, URETEROSCOPY AND REMOVAL STENT WITH  STENT PLACEMENT;  Surgeon: Alexis Frock, MD;  Location: [facility];  Service: Urology;  Laterality: Right;  . ESOPHAGOGASTRODUODENOSCOPY (EGD) WITH PROPOFOL N/A 11/16/2014    Procedure: ESOPHAGOGASTRODUODENOSCOPY (EGD) WITH PROPOFOL;  Surgeon: Milus Banister, MD;  Location: [facility];  Service: Endoscopy;  Laterality: N/A;  . HOLMIUM LASER APPLICATION Right 1/59/4585   Procedure: HOLMIUM LASER APPLICATION;  Surgeon: Alexis Frock, MD;  Location[facility];  Service: Urology;  Laterality: Right;  . LIVER BIOPSY    . OTHER SURGICAL HISTORY Right    removal of ovarian cyst  . removal of uterine cyst     years ago  . RIGHT VATS W/ DRAINAGE PEURAL EFFUSION AND BX'S  10-30-2008    has Leukocytosis; Nausea & vomiting; Abdominal pain; Abnormal LFTs; GERD (gastroesophageal reflux disease); Hepatitis B; Bilateral leg numbness; Gastroesophageal reflux disease without esophagitis; Normocytic anemia; Constipation; GIB (gastrointestinal bleeding); Healthcare-associated pneumonia; Hepatitis; 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[place]); Sepsis ([place]); UTI (urinary tract infection); Acute pyelonephritis; Immunosuppressed status ([place]); Port catheter in place; Steroid-induced diabetes ([place]); Dehydration; Fever; Hemorrhoid; Headache; and Hypersensitivity reaction on her problem list.    has No Known Allergies.  Allergies as of 04/18/2016   No Known Allergies     Medication List       Accurate as of 04/18/16  5:07 PM. Always use your most recent med list.          acyclovir 800 MG tablet Commonly known as:  ZOVIRAX Take 1 tablet (800 mg total) by mouth 2 (two) times daily.   dexamethasone 4 MG tablet Commonly known as:  DECADRON Take 1 tablet (4 mg total) by mouth 2 (two) times daily with a meal.   docusate sodium 100 MG capsule Commonly known as:  COLACE Take 1 capsule (100 mg total) by mouth 2 (two) times daily.   entecavir 0.5 MG tablet Commonly known as:  BARACLUDE Take 0.5 mg by mouth daily.   esomeprazole 40 MG capsule Commonly known as:  NEXIUM Take 1 capsule (40 mg total) by mouth daily at 12 noon.   hydrocortisone 2.5 % rectal cream Commonly known  as:  ANUSOL-HC Place 1 application rectally 2 (two) times daily.   levofloxacin 750 MG tablet Commonly known as:  LEVAQUIN Take 1 tablet (750 mg total) by mouth daily.   metFORMIN 500 MG tablet Commonly known as:  GLUCOPHAGE Take 1 tablet (500 mg total) by mouth 2 (two) times daily with a meal.   omeprazole 20 MG capsule Commonly known as:  PRILOSEC Take 1 capsule (20 mg total) by mouth daily.   ondansetron 8 MG tablet Commonly known as:  ZOFRAN Take 1 tablet (8 mg total) by mouth every 8 (eight) hours as needed for nausea or vomiting.   polyethylene glycol powder powder Commonly known as:  MIRALAX Take 255 g by mouth daily. Take one capful mixed in water daily.   potassium chloride SA 20 MEQ tablet Commonly known as:  K-DUR,KLOR-CON Take 1 tablet (20 mEq total) by mouth daily. Take 1 tablet ( 20 meq ) by mouth daily for 7 days;  Then as directed.        PHYSICAL EXAMINATION  Oncology Vitals 04/18/2016 04/18/2016  Height - -  Weight - -  Weight (  lbs) - -  BMI (kg/m2) - -  Temp 98 97.7  Pulse 106 109  Resp 18 20  SpO2 96 98  BSA (m2) - -   BP Readings from Last 2 Encounters:  04/18/16 (!) 101/52  04/18/16 (!) 95/54    Physical Exam  Constitutional: She is oriented to person, place, and time. She appears malnourished. She appears unhealthy. She appears cachectic.  HENT:  Head: Normocephalic and atraumatic.  Mouth/Throat: Oropharynx is clear and moist.  Eyes: Conjunctivae and EOM are normal. Pupils are equal, round, and reactive to light.  Neck: Normal range of motion.  Pulmonary/Chest: No stridor. She is in respiratory distress.  Musculoskeletal: Normal range of motion.  Neurological: She is alert and oriented to person, place, and time.  Skin: Skin is warm and dry. There is pallor.  Psychiatric: Affect normal.  Nursing note and vitals reviewed.   LABORATORY DATA:. Appointment on 04/18/2016  Component Date Value Ref Range Status  . WBC 04/18/2016 8.1  3.9  - 10.3 10e3/uL Final  . NEUT# 04/18/2016 6.0  1.5 - 6.5 10e3/uL Final  . HGB 04/18/2016 9.5* 11.6 - 15.9 g/dL Final  . HCT 04/18/2016 29.1* 34.8 - 46.6 % Final  . Platelets 04/18/2016 285  145 - 400 10e3/uL Final  . MCV 04/18/2016 88.5  79.5 - 101.0 fL Final  . MCH 04/18/2016 28.8  25.1 - 34.0 pg Final  . MCHC 04/18/2016 32.5  31.5 - 36.0 g/dL Final  . RBC 04/18/2016 3.29* 3.70 - 5.45 10e6/uL Final  . RDW 04/18/2016 17.7* 11.2 - 14.5 % Final  . lymph# 04/18/2016 0.9  0.9 - 3.3 10e3/uL Final  . MONO# 04/18/2016 1.1* 0.1 - 0.9 10e3/uL Final  . Eosinophils Absolute 04/18/2016 0.1  0.0 - 0.5 10e3/uL Final  . Basophils Absolute 04/18/2016 0.0  0.0 - 0.1 10e3/uL Final  . NEUT% 04/18/2016 74.2  38.4 - 76.8 % Final  . LYMPH% 04/18/2016 11.1* 14.0 - 49.7 % Final  . MONO% 04/18/2016 13.3  0.0 - 14.0 % Final  . EOS% 04/18/2016 1.0  0.0 - 7.0 % Final  . BASO% 04/18/2016 0.4  0.0 - 2.0 % Final  . Sodium 04/18/2016 140  136 - 145 mEq/L Final  . Potassium 04/18/2016 3.7  3.5 - 5.1 mEq/L Final  . Chloride 04/18/2016 107  98 - 109 mEq/L Final  . CO2 04/18/2016 24  22 - 29 mEq/L Final  . Glucose 04/18/2016 116  70 - 140 mg/dl Final  . BUN 04/18/2016 13.8  7.0 - 26.0 mg/dL Final  . Creatinine 04/18/2016 0.7  0.6 - 1.1 mg/dL Final  . Total Bilirubin 04/18/2016 0.22  0.20 - 1.20 mg/dL Final  . Alkaline Phosphatase 04/18/2016 122  40 - 150 U/L Final  . AST 04/18/2016 26  5 - 34 U/L Final  . ALT 04/18/2016 6  0 - 55 U/L Final  . Total Protein 04/18/2016 7.3  6.4 - 8.3 g/dL Final  . Albumin 04/18/2016 2.9* 3.5 - 5.0 g/dL Final  . Calcium 04/18/2016 9.3  8.4 - 10.4 mg/dL Final  . Anion Gap 04/18/2016 8  3 - 11 mEq/L Final  . EGFR 04/18/2016 >90  >90 ml/min/1.73 m2 Final  Hospital Outpatient Visit on 04/18/2016  Component Date Value Ref Range Status  . ABO/RH(D) 04/18/2016 O POS   Final  . Antibody Screen 04/18/2016 NEG   Final  . Sample Expiration 04/18/2016 04/21/2016   Final    RADIOGRAPHIC  STUDIES: No results found.  ASSESSMENT/PLAN:  Plasma cell leukemia [facility]) Patient presented to the [facility] today to receive her first cycle of daratumumab infusion and her 4th cycle of CyBorD.   See further notes for details of today's visit.  Patient will need to be scheduled for labs, visit, and her next cycle of chemotherapy in approximately 3 weeks.  Dr. Burr Medico has already requested these appointments be scheduled.    Hypersensitivity reaction Patient presented to the [facility] today to receive her first cycle of daratumumab infusion and her 4th cycle of CyBorD.  Patient developed a headache, flushing, and rigors.  Infusion was held; and patient was given Pepcid 20 mg, and femoral 12.5 mg.  Confirmed the patient was given Decadron, SciMed 125 mg, and Benadryl 50 mg at approximate 1045 this morning.  Dr. Burr Medico in to see pt as well.  She advised given the patient an additional Benadryl 25 mg as well as Medrol 60 mg.  All symptoms resolved; patient was able to complete her treatment as directed.     Patient stated understanding of all instructions; and was in agreement with this plan of care. The patient knows to call the clinic with any problems, questions or concerns.   Total time spent with patient was 40 minutes;  with greater than 75 percent of that time spent in face to face counseling regarding patient's symptoms,  and coordination of care and follow up.  Disclaimer:This dictation was prepared with Dragon/digital dictation along with [employer]. Any transcriptional errors that result from this process are unintentional.  Drue Second, NP 04/18/2016

## 2016-04-18 NOTE — Progress Notes (Signed)
At 1349 pt c/o chills.    Pt found to have rigors.  Infusion stopped at 1350.  Selena Lesser, NP notified.  IV Pepcid given and 12.5 of IV demerol.  Dr Burr Medico and Cyndee BAcon to chair side.  Additional 25mg  IV Benadryl and 60mg  IV Solumedrol given and will rechallenge starting at 50mg /hr at 1430.

## 2016-04-18 NOTE — Assessment & Plan Note (Signed)
Patient presented to the [facility] today to receive her first cycle of daratumumab infusion and her 4th cycle of CyBorD.   See further notes for details of today's visit.  Patient will need to be scheduled for labs, visit, and her next cycle of chemotherapy in approximately 3 weeks.  Dr. Burr Medico has already requested these appointments be scheduled.

## 2016-04-21 ENCOUNTER — Telehealth: Payer: Self-pay | Admitting: Hematology

## 2016-04-21 LAB — TYPE AND SCREEN
ABO/RH(D): O POS
ANTIBODY SCREEN: NEGATIVE

## 2016-04-21 NOTE — Telephone Encounter (Signed)
Left message re 3/22 appointments via [facility] [phone], Humphrey Rolls. Patient to get new schedule at next visit.

## 2016-04-22 NOTE — Progress Notes (Signed)
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      10/10/2014 Miscellaneous    Peripheral blood chemistry and leukocytosis with total white count 78K, comprised of large plasma cells and his normocytic anemia. There is a myeloid left shift with previous surgical radium blasts. Flow cytometry showed 64% plasma cells      10/10/2014 Bone Marrow Biopsy    Markedly hypercellular marrow (95%), Atypical plasma cells comprise 57% of the cellularity. There was diminished multilineage in hematopoiesis with adequate maturation. Breasts less than 1%), no overt dysplasia of the myeloid or erythroid lineages.       10/10/2014 Initial Diagnosis    Plasma cell leukemia      10/13/2014 - 02/22/2015 Chemotherapy    CyborD (cytoxan 35m/m2 iv, bortezomib 1.5 mg/m, dexamethasone 40 mg, weekly every 28 days, bortezomib and dexamethasone was given twice weekly for 2 weeks during the first cycle)      04/04/2015 Bone Marrow Transplant    [facility] was complicated by sepsis from Escherichia coli bacteremia and associated colitis, she was discharged home on 04/27/2015.      05/07/2015 - 05/12/2015 Hospital Admission    [facility], [facility] ID workup was negative, EGD showed evidence of gastritis and duodenitis, no H. pylori or CMV.      05/20/2015 - 05/24/2015 Hospital Admission    [facility]      07/11/2015 Bone Marrow Biopsy    Post transplant 100 a bone marrow biopsy showed hypocellular marrow, 20%, no increase in plasma cells (2%) or other abnormalities.      08/22/2015 - 01/02/2016 Chemotherapy    MaintenanceCyborD (cytoxan 3035mm2 iv, bortezomib 1.5 mg/m, dexamethasone 40 mg, every 2 weeks, changed to Velcade maintenance after 4 months treatment      01/16/2016 -  Chemotherapy    Maintenance Velcade 1.3 mg/m every 2 weeks      02/22/2016 Pathology Results    BONE MARROW: Diagnosis Bone Marrow, Aspirate,Biopsy, and Clot, right iliac - NORMOCELLULAR BONE MARROW FOR AGE WITH TRILINEAGE HEMATOPOIESIS. - PLASMACYTOSIS (PLASMA CELLS 12%). - SEE COMMENT. PERIPHERAL BLOOD: - OCCASIONAL CIRCULATING PLASMA CELLS.       HISTORY OF PRESENTING ILLNESS:  Sheryl [name] [age].o. female is here because of recently diagnosed plasma cell leukemia. [facility] and is here to establish her local oncological care [place] is accompanied to the clinic by her daughter and interpreter.  She presented to our hospital lth with worsening dyspnea, fatigue, cough, subjective fevers and chills, and was admitted on 09/14/2014. She was found to have allergy WBC 54.1K, hemoglobin 8.1, plt 310, she was treated with symptom management, and was discharged home on 09/17/2014, with a plan to follow-up with hematology. She represents to emergency room  on 10/07/2014, was found to have white count of 78K, and worsening anemia with hemoglobin 6. She was seen by my partner Dr. Jonette Eva and plasma cell leukemia was suspected. She was transferred to [facility] for further leukemia work-up and treatment. Bone marrow biopsy was done, which confirmed plasma cell leukemia, and she started  chemotherapy with Velcade, Cytoxan and dexamethasone. She received weekly dose twice, last dose on 10/20/2014. She was discharged to home afterwards. She had a mediport placed during her hospitalization.  She has moderate fatigue, low appetie, she lost about 13 lbs in the past few months. She has moderate abdominal pain, 5-6/10, persistent, she does not take any pain meds.   I reviewed her medical records extensively, and discussed her case with Dr. Jorje Guild and [place] program coordinator.  CURRENT THERAPY:   1.Maintenance Velcade 1.42m/m2 every 2 weeks, started on 01/16/2016, changed back to CyBorD every 2 weeks on 02/27/2016, and changed to weekly on 04/18/2016 2. Daratumumab weekly started on 04/18/2016 3. zometa every 4 weeks started on 11/27/2015  INTERIM HISTORY: Sheryl [name] returns for follow up and ongoing treatment. Patient did have infusion reaction during her first Dara infusion last week, but tolerated second rechallenged well. She unfortunately was not able to finish the infusion before our infusion room was closed. She has been doing well. Steroids made her tired yesterday but the patient tried to push through. Reports to constipation, despite taking Miralax 1-2 times a day. Last bowel movement was 3/14. A friend has told her to try ducalax to help with constipation, she will try it.    MEDICAL HISTORY:  Past Medical History:  Diagnosis Date  . Chills with fever    intermittently since d/c from hospital  . Dysuria-frequency syndrome    w/ pink urine  . GERD (gastroesophageal reflux disease)   . Hepatitis   . History of positive PPD    DX 2011--  CXR DONE NO EVIDENCE  . History of ureter stent   . Hydronephrosis, right   . Neuromuscular disorder ([facility])    legs numb intermittently  . 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[place]   . Pneumonia   . Right ureteral stone   . Urosepsis 8/14   ad[facility]    SURGICAL HISTORY: Past Surgical History:  Procedure Laterality Date  .  CYSTOSCOPY W/ URETERAL STENT PLACEMENT Right 09/25/2012   Procedure: CYSTOSCOPY WITH RETROGRADE PYELOGRAM/URETERAL STENT PLACEMENT;  Surgeon: TAlexis Frock MD;  Location: [facility];  Service: Urology;  Laterality: Right;  . CYSTOSCOPY WITH RETROGRADE PYELOGRAM, URETEROSCOPY AND STENT PLACEMENT Right 10/15/2012   Procedure: CYSTOSCOPY WITH RETROGRADE PYELOGRAM, URETEROSCOPY AND REMOVAL STENT WITH  STENT PLACEMENT;  Surgeon: TAlexis Frock MD;  [facility]  Service: Urology;  Laterality: Right;  . ESOPHAGOGASTRODUODENOSCOPY (EGD) WITH PROPOFOL N/A 11/16/2014   Procedure: ESOPHAGOGASTRODUODENOSCOPY (EGD) WITH PROPOFOL;  Surgeon: DMilus Banister MD;  Location: [facility];  Service: Endoscopy;  Laterality: N/A;  . HOLMIUM LASER APPLICATION Right 95/28/4132  Procedure: HOLMIUM LASER APPLICATION;  Surgeon: TAlexis Frock MD;  [facility]  Service: Urology;  Laterality: Right;  . LIVER BIOPSY    . OTHER SURGICAL HISTORY Right    removal of ovarian cyst  . removal of uterine cyst     years ago  . RIGHT VATS W/ DRAINAGE PEURAL EFFUSION AND BX'S  10-30-2008    SOCIAL HISTORY: Social History   Social History  . Marital status: Single    Spouse name: N/A  . Number  of children: 3  . Years of education: N/A   Social History Main Topics  . Smoking status: Never Smoker  . Smokeless tobacco: Never Used  . Alcohol use No  . Drug use: No  . Sexual activity: Not Currently    Birth control/ protection: Abstinence   Other Topics Concern  . Not on file   Social History Narrative  . No narrative on file    FAMILY HISTORY: Family History  Problem Relation Age of Onset  . Stomach cancer Mother   . Lung disease Father   . Asthma Father     ALLERGIES:  has No Known Allergies.  MEDICATIONS:  Current Outpatient Prescriptions  Medication Sig Dispense Refill  . acyclovir (ZOVIRAX) 800 MG tablet Take 1 tablet (800 mg total) by mouth 2 (two)  times daily. 60 tablet 2  . dexamethasone (DECADRON) 4 MG tablet Take 1 tablet (4 mg total) by mouth 2 (two) times daily with a meal. 30 tablet 1  . docusate sodium (COLACE) 100 MG capsule Take 1 capsule (100 mg total) by mouth 2 (two) times daily. (Patient not taking: Reported on 04/09/2016) 60 capsule 1  . entecavir (BARACLUDE) 0.5 MG tablet Take 0.5 mg by mouth daily.    Marland Kitchen esomeprazole (NEXIUM) 40 MG capsule Take 1 capsule (40 mg total) by mouth daily at 12 noon. 30 capsule 2  . hydrocortisone (ANUSOL-HC) 2.5 % rectal cream Place 1 application rectally 2 (two) times daily. 30 g 0  . levofloxacin (LEVAQUIN) 750 MG tablet Take 1 tablet (750 mg total) by mouth daily. 7 tablet 0  . metFORMIN (GLUCOPHAGE) 500 MG tablet Take 1 tablet (500 mg total) by mouth 2 (two) times daily with a meal. 60 tablet 1  . montelukast (SINGULAIR) 10 MG tablet Take 1 tablet (10 mg total) by mouth once a week. 10 tablet 1  . omeprazole (PRILOSEC) 20 MG capsule Take 1 capsule (20 mg total) by mouth daily. 30 capsule 3  . ondansetron (ZOFRAN) 8 MG tablet Take 1 tablet (8 mg total) by mouth every 8 (eight) hours as needed for nausea or vomiting. 30 tablet 1  . polyethylene glycol powder (MIRALAX) powder Take 255 g by mouth daily. Take one capful mixed in water daily. (Patient not taking: Reported on 04/09/2016) 255 g 0  . potassium chloride SA (K-DUR,KLOR-CON) 20 MEQ tablet Take 1 tablet (20 mEq total) by mouth daily. Take 1 tablet ( 20 meq ) by mouth daily for 7 days;  Then as directed. (Patient not taking: Reported on 04/09/2016) 20 tablet 0   No current facility-administered medications for this visit.    Facility-Administered Medications Ordered in Other Visits  Medication Dose Route Frequency Provider Last Rate Last Dose  . heparin lock flush 100 unit/mL  500 Units Intracatheter Once PRN Truitt Merle, MD      . sodium chloride 0.9 % injection 10 mL  10 mL Intravenous PRN Truitt Merle, MD   10 mL at 12/11/15 1532  . sodium chloride  flush (NS) 0.9 % injection 10 mL  10 mL Intravenous PRN Truitt Merle, MD   10 mL at 10/09/15 1717  . sodium chloride flush (NS) 0.9 % injection 10 mL  10 mL Intracatheter PRN Truitt Merle, MD        REVIEW OF SYSTEMS:   Constitutional: no chills or abnormal night sweats (+) constipation Eyes: Denies blurriness of vision, double vision or watery eyes Ears, nose, mouth, throat, and face: Denies mucositis or sore throat Respiratory:  Denies cough, dyspnea or wheezes Cardiovascular: Denies palpitation, chest discomfort or lower extremity swelling Gastrointestinal:  Denies nausea, heartburn  Skin: Denies abnormal skin rashes Lymphatics: Denies new lymphadenopathy or easy bruising Neurological:Denies numbness, tingling or new weaknesses Behavioral/Psych: Mood is stable, no new changes  All other systems were reviewed with the patient and are negative.  PHYSICAL EXAMINATION:  ECOG PERFORMANCE STATUS: 1 Blood pressure 107/63, heart rate 76, temperature 98.1, respiratory rate 18, pulse ox 100% on room air. GENERAL:alert, no distress and comfortable SKIN: skin color, texture, turgor are normal, no rashes or significant lesions EYES: normal, conjunctiva are pink and non-injected, sclera clear OROPHARYNX:no exudate, no erythema and lips, buccal mucosa NECK: supple, thyroid normal size, non-tender, without nodularity LYMPH:  no palpable lymphadenopathy in the cervical, axillary or inguinal LUNGS: clear to auscultation and percussion with normal breathing effort HEART: regular rate & rhythm and no murmurs and no lower extremity edema ABDOMEN:abdomen soft, non-tender and normal bowel sounds Musculoskeletal:no cyanosis of digits and no clubbing  PSYCH: alert & oriented x 3 with fluent speech NEURO: no focal motor/sensory deficits  LABORATORY DATA:  I have reviewed the data as listed CBC Latest Ref Rng & Units 04/24/2016 04/18/2016 04/09/2016  WBC 3.9 - 10.3 10e3/uL 4.5 8.1 7.9  Hemoglobin 11.6 - 15.9 g/dL  10.8(L) 9.5(L) 9.0(L)  Hematocrit 34.8 - 46.6 % 33.0(L) 29.1(L) 27.3(L)  Platelets 145 - 400 10e3/uL 161 285 217    CMP Latest Ref Rng & Units 04/24/2016 04/18/2016 04/09/2016  Glucose 70 - 140 mg/dl 108 116 113  BUN 7.0 - 26.0 mg/dL 16.1 13.8 10.5  Creatinine 0.6 - 1.1 mg/dL 0.6 0.7 0.7  Sodium 136 - 145 mEq/L 139 140 139  Potassium 3.5 - 5.1 mEq/L 4.0 3.7 3.4(L)  Chloride 101 - 111 mmol/L - - -  CO2 22 - 29 mEq/L _0 Calcium 8.4 - 10.4 mg/dL 9.9 9.3 8.8  Total Protein 6.4 - 8.3 g/dL 7.7 7.3 6.9  Total Bilirubin 0.20 - 1.20 mg/dL 0.33 0.22 <0.22  Alkaline Phos 40 - 150 U/L 106 122 133  AST 5 - 34 U/L _1 ALT 0 - 55 U/L _2 SPEP M-protein  09/15/2014: 4.2 10/08/14: 4.6 12/08/2014: 0.2 02/15/2015: not sufficient sample for test   09/11/2015: not observed 10/09/2015: not det   11/06/2015: not det  12/25/2015: not det  02/13/2016: 0.3 03/12/2016: 0.8 04/09/2016: 0.6   IgG mg/dl 11/03/2014: 3150  12/08/2014:  759 02/14/2014: 860 09/11/2015: 1347 10/09/2015: 1457 11/06/2015: 1504 12/25/2015: 1400 02/13/2016: 1543 03/12/2016: 1860 04/09/2016: 1620  Kappa/lambda light chains levels and ration  11/03/14: 0.75, 152, 0.00 12/22/2014: 1.10, 2.60, 0.42 02/15/2015: 9.59,14.35, 0.67 09/11/2015: 2.44, 2.72, 0.90 10/09/2015: 3.09, 3.49, 0.89 11/06/2015: 2.30, 2.62, 0.88 12/25/2015: 2.51, 3.0, 0.84 02/13/2016: 2.64, 15.3, 0.17 03/12/2016: 2.27, 45.5, 0.05 04/09/2016: 3.33, 45.5, 0.07  24 h urine UPEP/IFE and light chain: 11/03/2014: IFE showed a monoclonal IgG heavy chain with associated lambda light chain. M protein was Undetectable    PATHOLOGY REPORT  Bone Marrow (BM) and Peripheral Blood (PB) FINAL PATHOLOGIC DIAGNOSIS  BONE MARROW: 07/11/2015 Hypocellular marrow (20%) with no increase in plasma cells (2%). See comment.  PERIPHERAL BLOOD: Mild anemia. No circulating plasma cells identified. See comment and CBC data.  COMMENT: The bone marrow core biopsy and clot section are  hypocellular for age (20%, 10-40% range). There is no increase in plasma cells, which is confirmed by CD138 immunohistochemistry. Myeloid and erythroid elements are present  in normal proportion. Megakaryocytes are morphologically unremarkable. There are no atypical lymphoid aggregates or granulomas.  The bone marrow aspirate smears are adequate for evaluation. Plasma cells are not increased in numbers (2%). There is no increase in blasts (<1%). Myeloid and erythroid elements are present in normal proportion with a M:E ratio of 2.5:1. There is no overt dysplasia of the myeloid, erythroid, or megakaryocytic lineages. There is no lymphocytosis.  Examination of peripheral blood reveals no circulating plasma cells or rouleaux formation. The background leukocytes are morphologically unremarkable and consist mostly of neutrophils.  Immunohistochemical controls worked appropriately.  BONE MARROW: 02/22/2016 Diagnosis Bone Marrow, Aspirate,Biopsy, and Clot, right iliac - NORMOCELLULAR BONE MARROW FOR AGE WITH TRILINEAGE HEMATOPOIESIS. - PLASMACYTOSIS (PLASMA CELLS 12%). - SEE COMMENT. PERIPHERAL BLOOD: - OCCASIONAL CIRCULATING PLASMA CELLS. Diagnosis Note The bone marrow is generally normocellular for age with trilineage hematopoiesis and nonspecific myeloid changes likely related to previous therapy. In this background, the plasma cells are increased in number representing 12% of all cells associated with occasional small clusters and atypical cytomorphologic features. Immunohistochemical stains highlight the increased plasma cell component in the marrow but kappa and lambda staining is not entirely contributory with only possible weak lambda light chain excess. Nonetheless, the overall features favor recurrent/residual plasma cell neoplasm. Correlation with cytogenetic and FISH studies is recommended. (BNS:kh/gt 02-25-16)  RADIOGRAPHIC STUDIES: I have personally reviewed the radiological  images as listed and agreed with the findings in the report. No results found.  ASSESSMENT & PLAN: 47 y.o. [place] woman, presented with anemia and leokocytosis   1. 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 Norma Fredrickson recommendation to start maintenance chemotherapy with CyBorD every 2 weeks, for at least 4 months, she has been tolerating well overall, this was changed to maintenance therapy to Velcade 1.3 mg/m every 2 weeks in 01/2016, indefinitely, until disease progression -Her M protein has been undetectable since her transplant, however it became positive again last week, and her IgG level and lambda free light chain level has significantly increased also. This is highly suspicious for recurrent plasma cell leukemia. The total white count is normal. The mild anemia has been stable lately. I discussed the above with patient -We previously discussed bone marrow biopsy results from 02/22/2016. Unfortunately this showed increased plasma cells 12% with additional lambda light chain staining pending. This is relapse of disease. Cytogenetics was normal  -Her M protein and lambda free light chain level has significantly increased lately, consistent with disease relapse -Her recent LP showed negative CSF, no evidence of CNS involvement. -I have spoken with Dr. Norma Fredrickson, we agreed to change her CyBorD to weekly, and add daratumumab weekly, starting today. Potential side effects would discussed with patient, especially cytopenia and infusion reaction, she will take dexamethasone the day before Dara infusion.  -If her disease does not respond to treatment quickly, or if she has intolerance  issue to cyBorD, I will change to pomalidomide,  dexamethasone and Dara  -continue acyclovir  -Continue Zometa every 4 weeks for a total of 2 years (until 11/2017) -lab reviewed, adequate for treatment, second week Dara, and continue weekly CyBorD   2. Type 2 diabetes, steroids induced  -She was noticed to have increased blood  glucose level lately, with random blood glucose above 200. She also developed diabetic symptoms. No history of diabetes in the past. -This is likely steroids induced hyperglycemia -I strongly recommend her to avoid any soft drink and sweats, and watch her carbohydrate intake, and exercise more  -She has been seen by her primary care physician. She was not given any medication to manage her glucose. I will call her primary care physician to discuss adding metformin. -I previously instructed her to take metformin every day since she is now taking dexamethasone more frequently.  -She is going to restart her dexamethasone weekly as part of his leukemia treatment, and she will also receive steroids as premedication, I recommend her to take metformin 500 mg twice daily continuously, to control her hyperglycemia. Potential side effects discussed, she agrees to proceed.  3. Chronic Hepatitis B  - She will follow-up GI clinic at [facility] -continue entecavir. The patient is requesting a refill of entecavir. Our nursing staff will call her physician's office to request this for her.  4. GERD, recent GI bleeding and nausea  -Continue omeprazole daily. We discussed steroids can worsen her acid reflux. -She had an EGD at [facility] in 2017.   5. Constipation -I encouraged the patient to try senca-s, ducalax 1-3 times a day or milk of magnesium if miralax doresn't help with constipation  - I have advised the patient to take something everyday to help with constipation.   Plan - chemo CyBorD and Dara infusion every week.  She will take dexamethasone the day before infusion. Will  schedule for next 3 weeks - f/u in 2 weeks   All questions were answered. The patient knows to call the clinic with any problems, questions or concerns.  I spent 20 minutes counseling the patient face to face. We used the vedio interpreter service. The total time spent in the appointment was 25 minutes and more than 50% was on counseling.  This document serves as a record of services personally performed by Truitt Merle, MD. It was created on her behalf by Brandt Loosen, a trained medical scribe. The creation of this record is based on the scribe's personal observations and the provider's statements to them. This document has been checked and approved by the attending provider.   I have reviewed the above documentation for accuracy and completeness and I agree with the above.   Truitt Merle, MD 04/24/2016

## 2016-04-24 ENCOUNTER — Other Ambulatory Visit (HOSPITAL_BASED_OUTPATIENT_CLINIC_OR_DEPARTMENT_OTHER): Payer: BLUE CROSS/BLUE SHIELD

## 2016-04-24 ENCOUNTER — Ambulatory Visit: Payer: BLUE CROSS/BLUE SHIELD

## 2016-04-24 ENCOUNTER — Ambulatory Visit (HOSPITAL_BASED_OUTPATIENT_CLINIC_OR_DEPARTMENT_OTHER): Payer: BLUE CROSS/BLUE SHIELD | Admitting: Hematology

## 2016-04-24 ENCOUNTER — Ambulatory Visit (HOSPITAL_BASED_OUTPATIENT_CLINIC_OR_DEPARTMENT_OTHER): Payer: BLUE CROSS/BLUE SHIELD

## 2016-04-24 VITALS — BP 107/63 | HR 76 | Temp 98.1°F | Resp 18

## 2016-04-24 DIAGNOSIS — E099 Drug or chemical induced diabetes mellitus without complications: Secondary | ICD-10-CM

## 2016-04-24 DIAGNOSIS — Z95828 Presence of other vascular implants and grafts: Secondary | ICD-10-CM

## 2016-04-24 DIAGNOSIS — T380X5A Adverse effect of glucocorticoids and synthetic analogues, initial encounter: Secondary | ICD-10-CM

## 2016-04-24 DIAGNOSIS — C9012 Plasma cell leukemia in relapse: Secondary | ICD-10-CM | POA: Diagnosis not present

## 2016-04-24 DIAGNOSIS — Z5112 Encounter for antineoplastic immunotherapy: Secondary | ICD-10-CM

## 2016-04-24 DIAGNOSIS — K5900 Constipation, unspecified: Secondary | ICD-10-CM | POA: Diagnosis not present

## 2016-04-24 DIAGNOSIS — D649 Anemia, unspecified: Secondary | ICD-10-CM | POA: Diagnosis not present

## 2016-04-24 DIAGNOSIS — B181 Chronic viral hepatitis B without delta-agent: Secondary | ICD-10-CM

## 2016-04-24 DIAGNOSIS — C9010 Plasma cell leukemia not having achieved remission: Secondary | ICD-10-CM

## 2016-04-24 DIAGNOSIS — C9011 Plasma cell leukemia in remission: Secondary | ICD-10-CM

## 2016-04-24 LAB — COMPREHENSIVE METABOLIC PANEL
ALBUMIN: 3.5 g/dL (ref 3.5–5.0)
ALK PHOS: 106 U/L (ref 40–150)
ALT: 13 U/L (ref 0–55)
AST: 27 U/L (ref 5–34)
Anion Gap: 10 mEq/L (ref 3–11)
BILIRUBIN TOTAL: 0.33 mg/dL (ref 0.20–1.20)
BUN: 16.1 mg/dL (ref 7.0–26.0)
CALCIUM: 9.9 mg/dL (ref 8.4–10.4)
CO2: 23 mEq/L (ref 22–29)
Chloride: 106 mEq/L (ref 98–109)
Creatinine: 0.6 mg/dL (ref 0.6–1.1)
EGFR: >90 mL/min/{1.73_m2} (ref >90)
GLUCOSE: 108 mg/dL (ref 70–140)
POTASSIUM: 4 meq/L (ref 3.5–5.1)
SODIUM: 139 meq/L (ref 136–145)
TOTAL PROTEIN: 7.7 g/dL (ref 6.4–8.3)

## 2016-04-24 LAB — CBC WITH DIFFERENTIAL/PLATELET
BASO%: 0.2 % (ref 0.0–2.0)
BASOS ABS: 0 10*3/uL (ref 0.0–0.1)
EOS ABS: 0 10*3/uL (ref 0.0–0.5)
EOS%: 0 % (ref 0.0–7.0)
HEMATOCRIT: 33 % — AB (ref 34.8–46.6)
HEMOGLOBIN: 10.8 g/dL — AB (ref 11.6–15.9)
LYMPH#: 0.5 10*3/uL — AB (ref 0.9–3.3)
LYMPH%: 10.3 % — ABNORMAL LOW (ref 14.0–49.7)
MCH: 28.6 pg (ref 25.1–34.0)
MCHC: 32.7 g/dL (ref 31.5–36.0)
MCV: 87.6 fL (ref 79.5–101.0)
MONO#: 0.2 10*3/uL (ref 0.1–0.9)
MONO%: 4.9 % (ref 0.0–14.0)
NEUT%: 84.6 % — ABNORMAL HIGH (ref 38.4–76.8)
NEUTROS ABS: 3.8 10*3/uL (ref 1.5–6.5)
PLATELETS: 161 10*3/uL (ref 145–400)
RBC: 3.76 10*6/uL (ref 3.70–5.45)
RDW: 17.8 % — AB (ref 11.2–14.5)
WBC: 4.5 10*3/uL (ref 3.9–10.3)

## 2016-04-24 MED ORDER — SODIUM CHLORIDE 0.9% FLUSH
10.0000 mL | INTRAVENOUS | Status: DC | PRN
  Administered 2016-04-24: 10 mL
  Filled 2016-04-24: qty 10

## 2016-04-24 MED ORDER — METHYLPREDNISOLONE SODIUM SUCC 125 MG IJ SOLR
INTRAMUSCULAR | Status: AC
  Filled 2016-04-24: qty 2

## 2016-04-24 MED ORDER — PROCHLORPERAZINE MALEATE 10 MG PO TABS
10.0000 mg | ORAL_TABLET | Freq: Once | ORAL | Status: AC
  Administered 2016-04-24: 10 mg via ORAL

## 2016-04-24 MED ORDER — HEPARIN SOD (PORK) LOCK FLUSH 100 UNIT/ML IV SOLN
500.0000 [IU] | Freq: Once | INTRAVENOUS | Status: AC | PRN
  Administered 2016-04-24: 500 [IU]
  Filled 2016-04-24: qty 5

## 2016-04-24 MED ORDER — BORTEZOMIB CHEMO SQ INJECTION 3.5 MG (2.5MG/ML)
1.5000 mg/m2 | Freq: Once | INTRAMUSCULAR | Status: AC
  Administered 2016-04-24: 2.5 mg via SUBCUTANEOUS
  Filled 2016-04-24: qty 2.5

## 2016-04-24 MED ORDER — MONTELUKAST SODIUM 10 MG PO TABS
10.0000 mg | ORAL_TABLET | Freq: Once | ORAL | Status: AC
  Administered 2016-04-24: 10 mg via ORAL

## 2016-04-24 MED ORDER — PALONOSETRON HCL INJECTION 0.25 MG/5ML
0.2500 mg | Freq: Once | INTRAVENOUS | Status: AC
  Administered 2016-04-24: 0.25 mg via INTRAVENOUS

## 2016-04-24 MED ORDER — SODIUM CHLORIDE 0.9 % IV SOLN: 16.0000 mg/kg | Freq: Once | INTRAVENOUS | Status: DC

## 2016-04-24 MED ORDER — DEXAMETHASONE SODIUM PHOSPHATE 10 MG/ML IJ SOLN
10.0000 mg | Freq: Once | INTRAMUSCULAR | Status: AC
  Administered 2016-04-24: 10 mg via INTRAVENOUS

## 2016-04-24 MED ORDER — SODIUM CHLORIDE 0.9 % IV SOLN
900.0000 mg | Freq: Once | INTRAVENOUS | Status: AC
  Administered 2016-04-24: 900 mg via INTRAVENOUS
  Filled 2016-04-24: qty 40

## 2016-04-24 MED ORDER — ACETAMINOPHEN 325 MG PO TABS
650.0000 mg | ORAL_TABLET | Freq: Once | ORAL | Status: AC
  Administered 2016-04-24: 650 mg via ORAL

## 2016-04-24 MED ORDER — DIPHENHYDRAMINE HCL 25 MG PO CAPS
50.0000 mg | ORAL_CAPSULE | Freq: Once | ORAL | Status: AC
  Administered 2016-04-24: 50 mg via ORAL

## 2016-04-24 MED ORDER — SODIUM CHLORIDE 0.9 % IJ SOLN
10.0000 mL | INTRAMUSCULAR | Status: DC | PRN
  Administered 2016-04-24: 10 mL via INTRAVENOUS
  Filled 2016-04-24: qty 10

## 2016-04-24 MED ORDER — SODIUM CHLORIDE 0.9 % IV SOLN
300.0000 mg/m2 | Freq: Once | INTRAVENOUS | Status: AC
  Administered 2016-04-24: 480 mg via INTRAVENOUS
  Filled 2016-04-24: qty 24

## 2016-04-24 MED ORDER — ACETAMINOPHEN 325 MG PO TABS
ORAL_TABLET | ORAL | Status: AC
  Filled 2016-04-24: qty 2

## 2016-04-24 MED ORDER — SODIUM CHLORIDE 0.9 % IV SOLN
Freq: Once | INTRAVENOUS | Status: AC
  Administered 2016-04-24: 09:00:00 via INTRAVENOUS

## 2016-04-24 MED ORDER — DEXAMETHASONE SODIUM PHOSPHATE 10 MG/ML IJ SOLN
INTRAMUSCULAR | Status: AC
Start: 2016-04-24 — End: 2016-04-24
  Filled 2016-04-24: qty 1

## 2016-04-24 MED ORDER — PALONOSETRON HCL INJECTION 0.25 MG/5ML
INTRAVENOUS | Status: AC
  Filled 2016-04-24: qty 5

## 2016-04-24 MED ORDER — METHYLPREDNISOLONE SODIUM SUCC 125 MG IJ SOLR
125.0000 mg | Freq: Once | INTRAMUSCULAR | Status: AC
  Administered 2016-04-24: 125 mg via INTRAVENOUS

## 2016-04-24 MED ORDER — PROCHLORPERAZINE MALEATE 10 MG PO TABS
ORAL_TABLET | ORAL | Status: AC
  Filled 2016-04-24: qty 1

## 2016-04-24 MED ORDER — MONTELUKAST SODIUM 10 MG PO TABS
ORAL_TABLET | ORAL | Status: AC
  Filled 2016-04-24: qty 1

## 2016-04-24 MED ORDER — DIPHENHYDRAMINE HCL 25 MG PO CAPS
ORAL_CAPSULE | ORAL | Status: AC
Start: 2016-04-24 — End: 2016-04-24
  Filled 2016-04-24: qty 2

## 2016-04-24 NOTE — Patient Instructions (Addendum)
Daratumumab injection What is this medicine? DARATUMUMAB (dar a toom ue mab) is a monoclonal antibody. It is used to treat multiple myeloma. This medicine may be used for other purposes; ask your health care provider or pharmacist if you have questions. COMMON BRAND NAME(S): DARZALEX What should I tell my health care provider before I take this medicine? They need to know if you have any of these conditions: -infection (especially a virus infection such as chickenpox, cold sores, or herpes) -lung or breathing disease -pregnant or trying to get pregnant -breast-feeding -an unusual or allergic reaction to daratumumab, other medicines, foods, dyes, or preservatives How should I use this medicine? This medicine is for infusion into a vein. It is given by a health care professional in a hospital or clinic setting. Talk to your pediatrician regarding the use of this medicine in children. Special care may be needed. Overdosage: If you think you have taken too much of this medicine contact a poison control center or emergency room at once. NOTE: This medicine is only for you. Do not share this medicine with others. What if I miss a dose? Keep appointments for follow-up doses as directed. It is important not to miss your dose. Call your doctor or health care professional if you are unable to keep an appointment. What may interact with this medicine? Interactions have not been studied. Give your health care provider a list of all the medicines, herbs, non-prescription drugs, or dietary supplements you use. Also tell them if you smoke, drink alcohol, or use illegal drugs. Some items may interact with your medicine. This list may not describe all possible interactions. Give your health care provider a list of all the medicines, herbs, non-prescription drugs, or dietary supplements you use. Also tell them if you smoke, drink alcohol, or use illegal drugs. Some items may interact with your medicine. What  should I watch for while using this medicine? This drug may make you feel generally unwell. Report any side effects. Continue your course of treatment even though you feel ill unless your doctor tells you to stop. This medicine can cause serious allergic reactions. To reduce your risk you may need to take medicine before treatment with this medicine. Take your medicine as directed. This medicine can affect the results of blood tests to match your blood type. These changes can last for up to 6 months after the final dose. Your healthcare provider will do blood tests to match your blood type before you start treatment. Tell all of your healthcare providers that you are being treated with this medicine before receiving a blood transfusion. This medicine can affect the results of some tests used to determine treatment response; extra tests may be needed to evaluate response. Do not become pregnant while taking this medicine or for 3 months after stopping it. Women should inform their doctor if they wish to become pregnant or think they might be pregnant. There is a potential for serious side effects to an unborn child. Talk to your health care professional or pharmacist for more information. What side effects may I notice from receiving this medicine? Side effects that you should report to your doctor or health care professional as soon as possible: -allergic reactions like skin rash, itching or hives, swelling of the face, lips, or tongue -breathing problems -chills -cough -dizziness -feeling faint or lightheaded -headache -low blood counts - this medicine may decrease the number of white blood cells, red blood cells and platelets. You may be at increased risk  for infections and bleeding. -nausea, vomiting -shortness of breath -signs of decreased platelets or bleeding - bruising, pinpoint red spots on the skin, black, tarry stools, blood in the urine -signs of decreased red blood cells - unusually  weak or tired, feeling faint or lightheaded, falls -signs of infection - fever or chills, cough, sore throat, pain or difficulty passing urine Side effects that usually do not require medical attention (report to your doctor or health care professional if they continue or are bothersome): -back pain -diarrhea -muscle cramps -pain, tingling, numbness in the hands or feet -swelling of the ankles, feet, hands -tiredness This list may not describe all possible side effects. Call your doctor for medical advice about side effects. You may report side effects to FDA at 1-800-FDA-1088. Where should I keep my medicine? Keep out of the reach of children. This drug is given in a hospital or clinic and will not be stored at home. NOTE: This sheet is a summary. It may not cover all possible information. If you have questions about this medicine, talk to your doctor, pharmacist, or health care provider.  2018 Elsevier/Gold Standard (2015-02-22 10:38:11) Daratumumab injection ?y l thu?c g? DARATUMUMAB l m?t khng th? ??n dng. Thu?c ???c dng ?? ?i?u tr? b?nh ?a u t?y (multiple myeloma). Thu?c ny c th? ???c dng cho nh?ng m?c ?ch khc; hy h?i ng??i cung c?p d?ch v? y t? ho?c d??c s? c?a mnh, n?u qu v? c th?c m?c. (CC) NHN HI?U PH? BI?N: DARZALEX Ti c?n ph?i bo cho ng??i cung c?p d?ch v? y t? c?a mnh ?i?u g tr??c khi dng thu?c ny? H? c?n bi?t li?u qu v? c b?t k? tnh tr?ng no sau ?y khng: -nhi?m trng (??c bi?t l nhi?m virus, ch?ng h?n nh? th?y ??u, l? mi?ng, ho?c herpes) -b?nh ph?i ho?c h h?p -?ang c thai ho??c ??nh co? [place] -?ang cho con bu? -c pha?n ??ng b?t th???ng ho??c di? ??ng v??i daratumumab -pha?n ??ng b?t th???ng ho??c di? ??ng v??i ca?c d??c ph?m kha?c -pha?n ??ng b?t th???ng ho??c di? ??ng v??i th??c ph?m, thu?c nhu?m, ho??c ch?t ba?o qua?n Ti nn s? d?ng thu?c ny nh? th? no? Thu?c ny ?? truy?n vo t?nh m?ch. Thu?c ny ???c s? d?ng b?i chuyn vin y  t? ? b?nh vi?n ho?c ? phng m?ch. Hy bn v?i bc s? nhi khoa c?a qu v? v? vi?c dng thu?c ny ? tr? em. C th? c?n ch?m [place] ??c bi?t. Qu li?u: N?u qu v? cho r?ng mnh ? dng qu nhi?u thu?c ny, th hy lin l?c v?i trung tm ki?m sot ch?t ??c ho?c phng c?p c?u ngay l?p t?c. L?U : Thu?c ny ch? dnh ring cho qu v?. Khng chia s? thu?c ny v?i nh?ng ng??i khc. N?u ti l? qun m?t li?u th sao? Gi? ?ng h?n cho cc li?u b? sung nh? ? ???c ch? d?n. ?i?u quan tr?ng l khng nn b? l? li?u thu?c no. Hy lin l?c v?i bc s? ho?c [place] vin y t? c?a mnh, n?u qu v? khng th? gi? ?ng cu?c h?n khm. Nh?ng g c th? t??ng tc v?i thu?c ny? Ch?a co? nghin c??u v? t??ng ta?c thu?c. Hy ??a cho ba?c si? ho??c chuyn vin y t? c?a mnh danh sch t?t c? cc thu?c, th?o d??c, cc thu?c khng c?n toa, ho?c cc ch? ph?m b? sung ti?t th?c m qu v? dng. C?ng nn bo cho h? bi?t r?ng qu v? c ht thu?c, u?ng r??u, ho?c c s?  d?ng ma ty tri php hay khng. Vi th? c th? t??ng tc v?i thu?c na?y. Danh sch ny c th? khng m t? ?? h?t cc t??ng tc c th? x?y ra. Hy ??a cho ng??i cung c?p d?ch v? y t? c?a mnh danh sch t?t c? cc thu?c, th?o d??c, cc thu?c khng c?n toa, ho?c cc ch? ph?m b? sung m qu v? dng. C?ng nn bo cho h? bi?t r?ng qu v? c ht thu?c, u?ng r??u, ho?c c s? d?ng ma ty tri php hay khng. Vi th? c th? t??ng tc v?i thu?c c?a qu v?. Ti c?n ph?i theo di ?i?u g trong khi dng thu?c ny? Thu?c ny c th? lm cho qu v? c?m th?y khng ???c kh?e nh? th??ng l?. Hy t??ng trnh m?i tc d?ng ph?. Hy ti?p t?c ??t ?i?u tr? c?a mnh ngay c? khi qu v? c?m th?y m?t, tr? khi bc s? yu c?u qu v? ng?ng ?i?u tr?. Thu?c ny c th? gy ra nh?ng ph?n ?ng d? ?ng nghim tr?ng. ?? gi?m nguy c?, c th? qu v? c?n ph?i dng thu?c tr??c khi ?i?u tr? b?ng thu?c ny. Hy dng thu?c ny nh? ? ???c ch? d?n. Thu?c ny c th? ?nh h??ng ??n k?t qu? c?a cc xt nghi?m mu ?? xc ??nh nhm mu  c?a qu v?. Nh?ng thay ??i ny c th? ko di cho t?i 6 thng sau li?u thu?c cu?i cng. Bc s? ho?c chuyn vin y t? s? lm cc xt nghi?m mu ?? tm nhm mu c?a qu v? tr??c khi b?t ??u ?i?u tr?. Tr??c khi ???c truy?n mu, hy bo cho bc s? ho?c chuyn vin y t? bi?t r?ng qu v? ?ang ???c ?i?u tr? b?ng thu?c ny. Thu?c ny c th? ?nh h??ng ??n k?t qu? c?a m?t s? xt nghi?m dng ?? xc ??nh m?c ?? ?p ?ng ?i?u tr?; c th? c?n thm cc xt nghi?m b? sung ?? ?nh gi m?c ?? ?p ?ng. Khng ???c c thai trong th?i gian dng thu?c ny ho?c trong vng 3 thng sau khi ng?ng dng thu?c. Ph? n? c?n ph?i thng bo cho bc s? c?a mnh, n?u mu?n c thai ho?c ngh? r?ng c th? mnh ? c [place]. C nguy c? v? cc tc d?ng ph? nghim tr?ng ??i v?i [place] nhi. Hy th?o lu?n v?i bc s? ho?c chuyn vin y t? ho?c d??c s? ?? bi?t thm thng tin. Ti c th? nh?n th?y nh?ng tc d?ng ph? no khi dng thu?c ny? Nh?ng tc d?ng ph? qu v? c?n ph?i bo cho bc s? ho?c chuyn vin y t? cng s?m cng t?t: -cc ph?n ?ng d? ?ng, ch?ng h?n nh? da b? m?n ??, ng?a, n?i my ?ay, s?ng ? m?t, mi, ho?c l??i -kh th? -?n l?nh -ho -chng m?t -hoa m?t ho?c ng?t x?u -?au ??u -gi?m s? l??ng t? bo mu - thu?c ny c th? lm gi?m s? l??ng t? bo b?ch c?u, t? bo h?ng c?u v ti?u c?u. [name] c th? c nguy c? cao b? nhi?m trng v xu?t huy?t. -bu?n i ho?c i m?a -kh th? -cc d?u hi?u gi?m ti?u c?u ho?c xu?t huy?t - b?m tm, cc n?t l?m t?m ?? trn da, phn c mu ?en, mu h?c n, c mu trong n??c ti?u -cc d?u hi?u gi?m s? l??ng t? bo h?ng c?u - c?m th?y y?u ?t ho?c m?t m?i m?t cch b?t th??ng, cc c?n ng?t x?u, chong vng -cc d?u hi?u nhi?m trng - s?t ho?c ?  n l?nh, ho, ?au h?ng, kh ?i ti?u ho?c ?i ti?u ?au Cc tc d?ng ph? khng c?n ph?i ch?m [place] y t? (hy bo cho bc s? ho?c chuyn vin y t?, n?u cc tc d?ng ph? ny ti?p di?n ho?c gy phi?n toi): -?au l?ng -tiu ch?y -co th?t c? b?p ho?c v?p b? -?au, t ho?c c?m gic nh? b?  ki?n b ? bn tay ho?c bn chn -s?ng ? m?t c chn, bn chn, ho?c bn tay -c?m th?y m?t m?i Danh sch ny c th? khng m t? ?? h?t cc tc d?ng ph? c th? x?y ra. Xin g?i t?i bc s? c?a mnh ?? ???c c? v?n chuyn mn v? cc tc d?ng ph?Sander Nephew v? c th? t??ng trnh cc tc d?ng ph? cho FDA theo s? [phone]. Ti nn c?t gi? thu?c c?a mnh ? ?u? ?? ngoi t?m tay tr? em. Thu?c ny ???c s? d?ng b?i chuyn vin y t? ? b?nh vi?n ho?c ? phng m?ch. [name] s? khng ???c c?p thu?c ny ?? c?t gi? t?i nh. L?U : ?y l b?n tm t?t. N c th? khng bao hm t?t c? thng tin c th? c. N?u qu v? th?c m?c v? thu?c ny, xin trao ??i v?i bc s?, d??c s?, ho?c ng??i cung c?p d?ch v? y t? c?a mnh.  2018 Elsevier/Gold Standard (2016-02-21 00:00:00)

## 2016-04-24 NOTE — Patient Instructions (Signed)
Implanted Port Home Guide An implanted port is a type of central line that is placed under the skin. Central lines are used to provide IV access when treatment or nutrition needs to be given through a person's veins. Implanted ports are used for long-term IV access. An implanted port may be placed because:  You need IV medicine that would be irritating to the small veins in your hands or arms.  You need long-term IV medicines, such as antibiotics.  You need IV nutrition for a long period.  You need frequent blood draws for lab tests.  You need dialysis.  Implanted ports are usually placed in the chest area, but they can also be placed in the upper arm, the abdomen, or the leg. An implanted port has two main parts:  Reservoir. The reservoir is round and will appear as a small, raised area under your skin. The reservoir is the part where a needle is inserted to give medicines or draw blood.  Catheter. The catheter is a thin, flexible tube that extends from the reservoir. The catheter is placed into a large vein. Medicine that is inserted into the reservoir goes into the catheter and then into the vein.  How will I care for my incision site? Do not get the incision site wet. Bathe or shower as directed by your health care provider. How is my port accessed? Special steps must be taken to access the port:  Before the port is accessed, a numbing cream can be placed on the skin. This helps numb the skin over the port site.  Your health care provider uses a sterile technique to access the port. ? Your health care provider must put on a mask and sterile gloves. ? The skin over your port is cleaned carefully with an antiseptic and allowed to dry. ? The port is gently pinched between sterile gloves, and a needle is inserted into the port.  Only "non-coring" port needles should be used to access the port. Once the port is accessed, a blood return should be checked. This helps ensure that the port  is in the vein and is not clogged.  If your port needs to remain accessed for a constant infusion, a clear (transparent) bandage will be placed over the needle site. The bandage and needle will need to be changed every week, or as directed by your health care provider.  Keep the bandage covering the needle clean and dry. Do not get it wet. Follow your health care provider's instructions on how to take a shower or bath while the port is accessed.  If your port does not need to stay accessed, no bandage is needed over the port.  What is flushing? Flushing helps keep the port from getting clogged. Follow your health care provider's instructions on how and when to flush the port. Ports are usually flushed with saline solution or a medicine called heparin. The need for flushing will depend on how the port is used.  If the port is used for intermittent medicines or blood draws, the port will need to be flushed: ? After medicines have been given. ? After blood has been drawn. ? As part of routine maintenance.  If a constant infusion is running, the port may not need to be flushed.  How long will my port stay implanted? The port can stay in for as long as your health care provider thinks it is needed. When it is time for the port to come out, surgery will be   done to remove it. The procedure is similar to the one performed when the port was put in. When should I seek immediate medical care? When you have an implanted port, you should seek immediate medical care if:  You notice a bad smell coming from the incision site.  You have swelling, redness, or drainage at the incision site.  You have more swelling or pain at the port site or the surrounding area.  You have a fever that is not controlled with medicine.  This information is not intended to replace advice given to you by your health care provider. Make sure you discuss any questions you have with your health care provider. Document  Released: 01/20/2005 Document Revised: 06/28/2015 Document Reviewed: 09/27/2012 Elsevier Interactive Patient Education  2017 [employer]  

## 2016-04-25 ENCOUNTER — Telehealth: Payer: Self-pay | Admitting: Gastroenterology

## 2016-04-25 NOTE — Telephone Encounter (Signed)
Can you talk with her about miralax, gatorade split dose prep instead.  Thanks

## 2016-04-25 NOTE — Telephone Encounter (Signed)
Patient is provided her prep and her daughter has come by to pick it up.

## 2016-04-27 ENCOUNTER — Encounter: Payer: Self-pay | Admitting: Hematology

## 2016-04-29 ENCOUNTER — Telehealth: Payer: Self-pay | Admitting: Gastroenterology

## 2016-04-29 ENCOUNTER — Encounter: Payer: BLUE CROSS/BLUE SHIELD | Admitting: Gastroenterology

## 2016-04-29 NOTE — Telephone Encounter (Signed)
Please charge the late cancellation fee

## 2016-04-29 NOTE — Telephone Encounter (Signed)
Attempted to call patient to reschedule appointment but no answer and voicemail box was full.

## 2016-05-01 ENCOUNTER — Other Ambulatory Visit: Payer: Self-pay | Admitting: Hematology

## 2016-05-01 ENCOUNTER — Other Ambulatory Visit (HOSPITAL_BASED_OUTPATIENT_CLINIC_OR_DEPARTMENT_OTHER): Payer: BLUE CROSS/BLUE SHIELD

## 2016-05-01 ENCOUNTER — Ambulatory Visit (HOSPITAL_BASED_OUTPATIENT_CLINIC_OR_DEPARTMENT_OTHER): Payer: BLUE CROSS/BLUE SHIELD

## 2016-05-01 VITALS — BP 97/66 | HR 73 | Temp 97.6°F | Resp 16

## 2016-05-01 DIAGNOSIS — Z5112 Encounter for antineoplastic immunotherapy: Secondary | ICD-10-CM | POA: Diagnosis not present

## 2016-05-01 DIAGNOSIS — Z95828 Presence of other vascular implants and grafts: Secondary | ICD-10-CM

## 2016-05-01 DIAGNOSIS — C9010 Plasma cell leukemia not having achieved remission: Secondary | ICD-10-CM

## 2016-05-01 DIAGNOSIS — C9012 Plasma cell leukemia in relapse: Secondary | ICD-10-CM

## 2016-05-01 LAB — COMPREHENSIVE METABOLIC PANEL
ALT: 15 U/L (ref 0–55)
AST: 36 U/L — ABNORMAL HIGH (ref 5–34)
Albumin: 3.3 g/dL — ABNORMAL LOW (ref 3.5–5.0)
Alkaline Phosphatase: 104 U/L (ref 40–150)
Anion Gap: 8 mEq/L (ref 3–11)
BUN: 10.7 mg/dL (ref 7.0–26.0)
CHLORIDE: 109 meq/L (ref 98–109)
CO2: 21 mEq/L — ABNORMAL LOW (ref 22–29)
Calcium: 9.2 mg/dL (ref 8.4–10.4)
Creatinine: 0.6 mg/dL (ref 0.6–1.1)
GLUCOSE: 166 mg/dL — AB (ref 70–140)
POTASSIUM: 4 meq/L (ref 3.5–5.1)
SODIUM: 138 meq/L (ref 136–145)
TOTAL PROTEIN: 6.6 g/dL (ref 6.4–8.3)

## 2016-05-01 LAB — CBC WITH DIFFERENTIAL/PLATELET
BASO%: 0.2 % (ref 0.0–2.0)
Basophils Absolute: 0 10*3/uL (ref 0.0–0.1)
EOS%: 0.1 % (ref 0.0–7.0)
Eosinophils Absolute: 0 10*3/uL (ref 0.0–0.5)
HCT: 32 % — ABNORMAL LOW (ref 34.8–46.6)
HEMOGLOBIN: 10.4 g/dL — AB (ref 11.6–15.9)
LYMPH#: 0.2 10*3/uL — AB (ref 0.9–3.3)
LYMPH%: 17.7 % (ref 14.0–49.7)
MCH: 28.9 pg (ref 25.1–34.0)
MCHC: 32.5 g/dL (ref 31.5–36.0)
MCV: 88.9 fL (ref 79.5–101.0)
MONO#: 0 10*3/uL — AB (ref 0.1–0.9)
MONO%: 4.1 % (ref 0.0–14.0)
NEUT%: 77.9 % — ABNORMAL HIGH (ref 38.4–76.8)
NEUTROS ABS: 0.8 10*3/uL — AB (ref 1.5–6.5)
PLATELETS: 136 10*3/uL — AB (ref 145–400)
RBC: 3.6 10*6/uL — AB (ref 3.70–5.45)
RDW: 19.2 % — AB (ref 11.2–14.5)
WBC: 1.1 10*3/uL — AB (ref 3.9–10.3)

## 2016-05-01 MED ORDER — METHYLPREDNISOLONE SODIUM SUCC 125 MG IJ SOLR
125.0000 mg | Freq: Once | INTRAMUSCULAR | Status: AC
  Administered 2016-05-01: 125 mg via INTRAVENOUS

## 2016-05-01 MED ORDER — SODIUM CHLORIDE 0.9 % IV SOLN
Freq: Once | INTRAVENOUS | Status: AC
  Administered 2016-05-01: 10:00:00 via INTRAVENOUS

## 2016-05-01 MED ORDER — PALONOSETRON HCL INJECTION 0.25 MG/5ML
INTRAVENOUS | Status: AC
  Filled 2016-05-01: qty 5

## 2016-05-01 MED ORDER — HEPARIN SOD (PORK) LOCK FLUSH 100 UNIT/ML IV SOLN
500.0000 [IU] | Freq: Once | INTRAVENOUS | Status: AC | PRN
  Administered 2016-05-01: 500 [IU]
  Filled 2016-05-01: qty 5

## 2016-05-01 MED ORDER — DARATUMUMAB CHEMO INJECTION 400 MG/20ML
16.0000 mg/kg | Freq: Once | INTRAVENOUS | Status: AC
  Administered 2016-05-01: 900 mg via INTRAVENOUS
  Filled 2016-05-01: qty 5

## 2016-05-01 MED ORDER — ACETAMINOPHEN 325 MG PO TABS
ORAL_TABLET | ORAL | Status: AC
  Filled 2016-05-01: qty 2

## 2016-05-01 MED ORDER — ZOLEDRONIC ACID 4 MG/100ML IV SOLN
4.0000 mg | Freq: Once | INTRAVENOUS | Status: AC
  Administered 2016-05-01: 4 mg via INTRAVENOUS
  Filled 2016-05-01: qty 100

## 2016-05-01 MED ORDER — MONTELUKAST SODIUM 10 MG PO TABS
10.0000 mg | ORAL_TABLET | Freq: Once | ORAL | Status: AC
  Administered 2016-05-01: 10 mg via ORAL

## 2016-05-01 MED ORDER — SODIUM CHLORIDE 0.9% FLUSH
10.0000 mL | INTRAVENOUS | Status: DC | PRN
  Administered 2016-05-01: 10 mL
  Filled 2016-05-01: qty 10

## 2016-05-01 MED ORDER — DIPHENHYDRAMINE HCL 25 MG PO CAPS
50.0000 mg | ORAL_CAPSULE | Freq: Once | ORAL | Status: AC
  Administered 2016-05-01: 50 mg via ORAL

## 2016-05-01 MED ORDER — BORTEZOMIB CHEMO SQ INJECTION 3.5 MG (2.5MG/ML)
1.5000 mg/m2 | Freq: Once | INTRAMUSCULAR | Status: AC
  Administered 2016-05-01: 2.5 mg via SUBCUTANEOUS
  Filled 2016-05-01: qty 2.5

## 2016-05-01 MED ORDER — DIPHENHYDRAMINE HCL 25 MG PO CAPS
ORAL_CAPSULE | ORAL | Status: AC
  Filled 2016-05-01: qty 2

## 2016-05-01 MED ORDER — DEXAMETHASONE SODIUM PHOSPHATE 10 MG/ML IJ SOLN
INTRAMUSCULAR | Status: AC
  Filled 2016-05-01: qty 1

## 2016-05-01 MED ORDER — ACETAMINOPHEN 325 MG PO TABS
650.0000 mg | ORAL_TABLET | Freq: Once | ORAL | Status: AC
  Administered 2016-05-01: 650 mg via ORAL

## 2016-05-01 MED ORDER — PALONOSETRON HCL INJECTION 0.25 MG/5ML
0.2500 mg | Freq: Once | INTRAVENOUS | Status: AC
  Administered 2016-05-01: 0.25 mg via INTRAVENOUS

## 2016-05-01 MED ORDER — DEXAMETHASONE SODIUM PHOSPHATE 10 MG/ML IJ SOLN
10.0000 mg | Freq: Once | INTRAMUSCULAR | Status: AC
  Administered 2016-05-01: 10 mg via INTRAVENOUS

## 2016-05-01 MED ORDER — MONTELUKAST SODIUM 10 MG PO TABS
ORAL_TABLET | ORAL | Status: AC
  Filled 2016-05-01: qty 1

## 2016-05-01 MED ORDER — METHYLPREDNISOLONE SODIUM SUCC 125 MG IJ SOLR
INTRAMUSCULAR | Status: AC
  Filled 2016-05-01: qty 2

## 2016-05-01 NOTE — Progress Notes (Signed)
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      10/10/2014 Miscellaneous    Peripheral blood chemistry and leukocytosis with total white count 78K, comprised of large plasma cells and his normocytic anemia. There is a myeloid left shift with previous surgical radium blasts. Flow cytometry showed 64% plasma cells      10/10/2014 Bone Marrow Biopsy    Markedly hypercellular marrow (95%), Atypical plasma cells comprise 57% of the cellularity. There was diminished multilineage in hematopoiesis with adequate maturation. Breasts less than 1%), no overt dysplasia of the myeloid or erythroid lineages.       10/10/2014 Initial Diagnosis    Plasma cell leukemia      10/13/2014 - 02/22/2015 Chemotherapy    CyborD (cytoxan 342m/m2 iv, bortezomib 1.5 mg/m, dexamethasone 40 mg, weekly every 28 days, bortezomib and dexamethasone was given twice weekly for 2 weeks during the first cycle)      04/04/2015 Bone Marrow Transplant    [facility] Her [facility] course was complicated by sepsis from Escherichia coli bacteremia and associated colitis, she was discharged home on 04/27/2015.      05/07/2015 - 05/12/2015 Hospital Admission    [facility], [facility] ID workup was negative, EGD showed evidence of gastritis and duodenitis, no H. pylori or CMV.      05/20/2015 - 05/24/2015 Hospital Ad[facility]    [facility]      07/11/2015 Bone Marrow Biopsy    Post transplant 100 a bone marrow biopsy showed hypocellular marrow, 20%, no increase in plasma cells (2%) or other abnormalities.      08/22/2015 - 01/02/2016 Chemotherapy    MaintenanceCyborD (cytoxan 3021mm2 iv, bortezomib 1.5 mg/m, dexamethasone 40 mg, every 2 weeks, changed to Velcade maintenance after 4 months treatment      01/16/2016 - 04/19/2016 Chemotherapy    Maintenance Velcade 1.3 mg/m every 2 weeks      02/22/2016 Pathology Results    BONE MARROW: Diagnosis Bone Marrow, Aspirate,Biopsy, and Clot, right iliac - NORMOCELLULAR BONE MARROW FOR AGE WITH TRILINEAGE HEMATOPOIESIS. - PLASMACYTOSIS (PLASMA CELLS 12%). - SEE COMMENT. PERIPHERAL BLOOD: - OCCASIONAL CIRCULATING PLASMA CELLS.      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 female is here because of recently diagnosed plasma cell leukemia. [facility] and is here to establish her local oncological care with [place] is accompanied to the clinic by her daughter and interpreter.  She presented to our hospital lth with worsening dyspnea, fatigue, cough, subjective fevers and chills, and was  admitted on 09/14/2014. She was found to have allergy WBC 54.1K, hemoglobin 8.1, plt 310, she was treated with symptom management, and was discharged home on 09/17/2014, with a plan to follow-up with hematology. She represents to emergency room on 10/07/2014, was found to have white count of 78K, and worsening anemia with hemoglobin 6. She was seen by my partner Dr. Jonette Eva and plasma cell  leukemia was suspected. She was [facility] for further leukemia work-up and treatment. Bone marrow biopsy was done, which confirmed plasma cell leukemia, and she started chemotherapy with Velcade, Cytoxan and dexamethasone. She received weekly dose twice, last dose on 10/20/2014. She was discharged to home afterwards. She had a mediport placed during her hospitalization.  She has moderate fatigue, low appetie, she lost about 13 lbs in the past few months. She has moderate abdominal pain, 5-6/10, persistent, she does not take any pain meds.   I reviewed her medical records extensively, and discussed her case with Dr. Jorje Guild and [place] program coordinator.  CURRENT THERAPY:   1.Maintenance Velcade 1.75m/m2 every 2 weeks, started on 01/16/2016, changed back to CyBorD every 2 weeks on 02/27/2016, and changed to weekly on 04/18/2016 due to disease relapse  2. Daratumumab weekly started on 04/18/2016 3. zometa every 4 weeks started on 11/27/2015  INTERIM HISTORY: Garrie returns for follow up she is on week 4 treatment. Yesterday and the day before she had a fever of 100.4. She took ibuprofen 4030m3x a day. After temperature went down, she ate. Her last Ibropupron was 7pm on 05/07/16. She also experienced cough in the morning with clear mucous. Denies chest pain and SOB, burning while urination. Watery stool once daily since prep. Appetite is good. She says taking the steroids makes her tired   MEDICAL HISTORY:  Past Medical History:  Diagnosis Date  . Chills with fever    intermittently since d/c from hospital  . Dysuria-frequency syndrome    w/ pink urine  . GERD (gastroesophageal reflux disease)   . Hepatitis   . History of positive PPD    DX 2011--  CXR DONE NO EVIDENCE  . History of ureter stent   . Hydronephrosis, right   . Neuromuscular disorder (HCC)    legs numb intermittently  . 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[place]  . Pneumonia   . Right ureteral stone   . Urosepsis  8/14   ad[facility]    SURGICAL HISTORY: Past Surgical History:  Procedure Laterality Date  . CYSTOSCOPY W/ URETERAL STENT PLACEMENT Right 09/25/2012   Procedure: CYSTOSCOPY WITH RETROGRADE PYELOGRAM/URETERAL STENT PLACEMENT;  Surgeon: ThAlexis FrockMD;  Location: [facility];  Service: Urology;  Laterality: Right;  . CYSTOSCOPY WITH RETROGRADE PYELOGRAM, URETEROSCOPY AND STENT PLACEMENT Right 10/15/2012   Procedure: CYSTOSCOPY WITH RETROGRADE PYELOGRAM, URETEROSCOPY AND REMOVAL STENT WITH  STENT PLACEMENT;  Surgeon: ThAlexis FrockMD;  [facility];  Laterality: Right;  . ESOPHAGOGASTRODUODENOSCOPY (EGD) WITH PROPOFOL N/A 11/16/2014   Procedure: ESOPHAGOGASTRODUODENOSCOPY (EGD) WITH PROPOFOL;  Surgeon: DaMilus BanisterMD;  Location: [facility] END[facility];  Service: Endoscopy;  Laterality: N/A;  . HOLMIUM LASER APPLICATION Right 10/10/27/5284 Procedure: HOLMIUM LASER APPLICATION;  Surgeon: ThAlexis FrockMD;  [facility];  Laterality: Right;  . LIVER BIOPSY    . OTHER SURGICAL HISTORY Right    removal of ovarian cyst  . removal of uterine cyst     years ago  . RIGHT VATS W/  DRAINAGE PEURAL EFFUSION AND BX'S  10-30-2008    SOCIAL HISTORY: Social History   Social History  . Marital status: Single    Spouse name: N/A  . Number of children: 3  . Years of education: N/A   Social History Main Topics  . Smoking status: Never Smoker  . Smokeless tobacco: Never Used  . Alcohol use No  . Drug use: No  . Sexual activity: Not Currently    Birth control/ protection: Abstinence   Other Topics Concern  . None   Social History Narrative  . None    FAMILY HISTORY: Family History  Problem Relation Age of Onset  . Stomach cancer Mother   . Lung disease Father   . Asthma Father     ALLERGIES:  has No Known Allergies.  MEDICATIONS:  Current Outpatient Prescriptions  Medication Sig Dispense Refill  .  acyclovir (ZOVIRAX) 800 MG tablet Take 1 tablet (800 mg total) by mouth 2 (two) times daily. 60 tablet 2  . dexamethasone (DECADRON) 4 MG tablet Take 1 tablet (4 mg total) by mouth 2 (two) times daily with a meal. 30 tablet 1  . docusate sodium (COLACE) 100 MG capsule Take 1 capsule (100 mg total) by mouth 2 (two) times daily. (Patient not taking: Reported on 04/09/2016) 60 capsule 1  . entecavir (BARACLUDE) 0.5 MG tablet Take 0.5 mg by mouth daily.    Marland Kitchen esomeprazole (NEXIUM) 40 MG capsule Take 1 capsule (40 mg total) by mouth daily at 12 noon. 30 capsule 2  . hydrocortisone (ANUSOL-HC) 2.5 % rectal cream Place 1 application rectally 2 (two) times daily. 30 g 0  . levofloxacin (LEVAQUIN) 750 MG tablet Take 1 tablet (750 mg total) by mouth daily. 7 tablet 0  . metFORMIN (GLUCOPHAGE) 500 MG tablet Take 1 tablet (500 mg total) by mouth 2 (two) times daily with a meal. 60 tablet 1  . montelukast (SINGULAIR) 10 MG tablet Take 1 tablet (10 mg total) by mouth once a week. 10 tablet 1  . omeprazole (PRILOSEC) 20 MG capsule Take 1 capsule (20 mg total) by mouth daily. 30 capsule 3  . ondansetron (ZOFRAN) 8 MG tablet Take 1 tablet (8 mg total) by mouth every 8 (eight) hours as needed for nausea or vomiting. 30 tablet 1  . polyethylene glycol powder (MIRALAX) powder Take 255 g by mouth daily. Take one capful mixed in water daily. (Patient not taking: Reported on 04/09/2016) 255 g 0  . potassium chloride SA (K-DUR,KLOR-CON) 20 MEQ tablet Take 1 tablet (20 mEq total) by mouth daily. Take 1 tablet ( 20 meq ) by mouth daily for 7 days;  Then as directed. (Patient not taking: Reported on 04/09/2016) 20 tablet 0   No current facility-administered medications for this visit.    Facility-Administered Medications Ordered in Other Visits  Medication Dose Route Frequency Provider Last Rate Last Dose  . sodium chloride 0.9 % injection 10 mL  10 mL Intravenous PRN Truitt Merle, MD   10 mL at 12/11/15 1532  . sodium chloride flush  (NS) 0.9 % injection 10 mL  10 mL Intravenous PRN Truitt Merle, MD   10 mL at 10/09/15 1717    REVIEW OF SYSTEMS:   Constitutional: no chills or abnormal night sweats (+) loose stool Eyes: Denies blurriness of vision, double vision or watery eyes Ears, nose, mouth, throat, and face: Denies mucositis (+) sore throat, dry mouth/throat Respiratory: Denies, dyspnea or wheezes (+) coughs up clear mucous  Cardiovascular: Denies palpitation,  chest discomfort or lower extremity swelling Gastrointestinal:  Denies nausea, heartburn  Skin: Denies abnormal skin rashes Lymphatics: Denies new lymphadenopathy or easy bruising Neurological:Denies numbness, tingling or new weaknesses Behavioral/Psych: Mood is stable, no new changes  All other systems were reviewed with the patient and are negative.  PHYSICAL EXAMINATION:   ECOG PERFORMANCE STATUS: 1 Blood pressure 107/63, heart rate 76, temperature 98.1, respiratory rate 18, pulse ox 100% on room air. GENERAL:alert, no distress and comfortable SKIN: skin color, texture, turgor are normal, no rashes or significant lesions EYES: normal, conjunctiva are pink and non-injected, sclera clear OROPHARYNX:no exudate, no erythema and lips, buccal mucosa NECK: supple, thyroid normal size, non-tender, without nodularity LYMPH:  no palpable lymphadenopathy in the cervical, axillary or inguinal LUNGS: clear to auscultation and percussion with normal breathing effort HEART: regular rate & rhythm and no murmurs and no lower extremity edema ABDOMEN:abdomen soft, non-tender and normal bowel sounds Musculoskeletal:no cyanosis of digits and no clubbing  PSYCH: alert & oriented x 3 with fluent speech NEURO: no focal motor/sensory deficits  LABORATORY DATA:  I have reviewed the data as listed CBC Latest Ref Rng & Units 05/08/2016 05/01/2016 04/24/2016  WBC 3.9 - 10.3 10e3/uL 1.6(L) 1.1(L) 4.5  Hemoglobin 11.6 - 15.9 g/dL 11.1(L) 10.4(L) 10.8(L)  Hematocrit 34.8 - 46.6 %  33.9(L) 32.0(L) 33.0(L)  Platelets 145 - 400 10e3/uL 98(L) 136(L) 161    CMP Latest Ref Rng & Units 05/08/2016 05/01/2016 04/24/2016  Glucose 70 - 140 mg/dl 166(H) 166(H) 108  BUN 7.0 - 26.0 mg/dL 12.5 10.7 16.1  Creatinine 0.6 - 1.1 mg/dL 0.6 0.6 0.6  Sodium 136 - 145 mEq/L 142 138 139  Potassium 3.5 - 5.1 mEq/L 4.2 4.0 4.0  Chloride 101 - 111 mmol/L - - -  CO2 22 - 29 mEq/L 22 21(L) 23  Calcium 8.4 - 10.4 mg/dL 9.1 9.2 9.9  Total Protein 6.4 - 8.3 g/dL 6.5 6.6 7.7  Total Bilirubin 0.20 - 1.20 mg/dL <0.22 <0.22 0.33  Alkaline Phos 40 - 150 U/L 146 104 106  AST 5 - 34 U/L 45(H) 36(H) 27  ALT 0 - 55 U/L '20 15 13    ' SPEP M-protein  09/15/2014: 4.2 10/08/14: 4.6 12/08/2014: 0.2 02/15/2015: not sufficient sample for test   09/11/2015: not observed 10/09/2015: not det   11/06/2015: not det  12/25/2015: not det  02/13/2016: 0.3 03/12/2016: 0.8 04/09/2016: 0.6   IgG mg/dl 11/03/2014: 3150  12/08/2014:  759 02/14/2014: 860 09/11/2015: 1347 10/09/2015: 1457 11/06/2015: 1504 12/25/2015: 1400 02/13/2016: 1543 03/12/2016: 1860 04/09/2016: 1620  Kappa/lambda light chains levels and ration  11/03/14: 0.75, 152, 0.00 12/22/2014: 1.10, 2.60, 0.42 02/15/2015: 9.59,14.35, 0.67 09/11/2015: 2.44, 2.72, 0.90 10/09/2015: 3.09, 3.49, 0.89 11/06/2015: 2.30, 2.62, 0.88 12/25/2015: 2.51, 3.0, 0.84 02/13/2016: 2.64, 15.3, 0.17 03/12/2016: 2.27, 45.5, 0.05 04/09/2016: 3.33, 45.5, 0.07  24 h urine UPEP/IFE and light chain: 11/03/2014: IFE showed a monoclonal IgG heavy chain with associated lambda light chain. M protein was Undetectable    PATHOLOGY REPORT  Bone Marrow (BM) and Peripheral Blood (PB) FINAL PATHOLOGIC DIAGNOSIS  BONE MARROW: 07/11/2015 Hypocellular marrow (20%) with no increase in plasma cells (2%). See comment.  PERIPHERAL BLOOD: Mild anemia. No circulating plasma cells identified. See comment and CBC data.  COMMENT: The bone marrow core biopsy and clot section are hypocellular for age (20%, 10-40%  range). There is no increase in plasma cells, which is confirmed by CD138 immunohistochemistry. Myeloid and erythroid elements are present in normal proportion. Megakaryocytes are morphologically unremarkable.  There are no atypical lymphoid aggregates or granulomas.  The bone marrow aspirate smears are adequate for evaluation. Plasma cells are not increased in numbers (2%). There is no increase in blasts (<1%). Myeloid and erythroid elements are present in normal proportion with a M:E ratio of 2.5:1. There is no overt dysplasia of the myeloid, erythroid, or megakaryocytic lineages. There is no lymphocytosis.  Examination of peripheral blood reveals no circulating plasma cells or rouleaux formation. The background leukocytes are morphologically unremarkable and consist mostly of neutrophils.  Immunohistochemical controls worked appropriately.  BONE MARROW: 02/22/2016 Diagnosis Bone Marrow, Aspirate,Biopsy, and Clot, right iliac - NORMOCELLULAR BONE MARROW FOR AGE WITH TRILINEAGE HEMATOPOIESIS. - PLASMACYTOSIS (PLASMA CELLS 12%). - SEE COMMENT. PERIPHERAL BLOOD: - OCCASIONAL CIRCULATING PLASMA CELLS. Diagnosis Note The bone marrow is generally normocellular for age with trilineage hematopoiesis and nonspecific myeloid changes likely related to previous therapy. In this background, the plasma cells are increased in number representing 12% of all cells associated with occasional small clusters and atypical cytomorphologic features. Immunohistochemical stains highlight the increased plasma cell component in the marrow but kappa and lambda staining is not entirely contributory with only possible weak lambda light chain excess. Nonetheless, the overall features favor recurrent/residual plasma cell neoplasm. Correlation with cytogenetic and FISH studies is recommended. (BNS:kh/gt 02-25-16)  RADIOGRAPHIC STUDIES: I have personally reviewed the radiological images as listed and agreed with  the findings in the report. Dg Chest 2 View  Result Date: 05/08/2016 CLINICAL DATA:  Fever, cough and sore throat in patient on chemotherapy for leukemia. EXAM: CHEST  2 VIEW COMPARISON:  PA and lateral chest 11/13/2015. FINDINGS: Right IJ approach Port-A-Cath is unchanged. The lungs are clear. Heart size is normal. No pneumothorax or pleural effusion. No acute bony abnormality. IMPRESSION: No acute disease. Electronically Signed   By: Inge Rise M.D.   On: 05/08/2016 11:13     CT Biopsy 02/22/16 IMPRESSION: 1. Technically successful CT guided right iliac bone core and aspiration biopsy.  ASSESSMENT & PLAN: 46 y.o. [place] woman, presented with anemia and leokocytosis   1. 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 Norma Fredrickson recommendation to start maintenance chemotherapy with CyBorD every 2 weeks, for at least 4 months, she has been tolerating well overall, this was changed to maintenance therapy to Velcade 1.3 mg/m every 2 weeks in 01/2016, indefinitely, until disease progression -Her M protein has been undetectable since her transplant, however it became positive again last week, and her IgG level and lambda free light chain level has significantly increased also. This is highly suspicious for recurrent plasma cell leukemia. The total white count is normal. The mild anemia has been stable lately. I discussed the above with patient -We previously discussed bone marrow biopsy results from 02/22/2016. Unfortunately this showed increased plasma cells 12% with additional lambda light chain staining pending. This is relapse of disease. Cytogenetics was normal  -Her M protein and  lambda free light chain level has significantly increased lately, consistent with disease relapse -Her recent LP showed negative CSF, no evidence of CNS involvement. -I have previously spoken with Dr. Norma Fredrickson, we agreed to change her CyBorD to weekly, and add daratumumab weekly, starting today. Potential side effects would discussed with  patient, especially cytopenia and infusion reaction, she will take dexamethasone the day before Dara infusion.  -If her disease does not respond to treatment quickly, or if she has intolerance issue to cyBorD, I will change to pomalidomide,  dexamethasone and Dara  -continue acyclovir  -Continue Zometa every 4 weeks for a total of 2 years (until 11/2017) -lab previously  reviewed, adequate for treatment, second week Dara, and continue weekly CyBorD  - Palate counts low because of chemo - Cytoxan held on 05/01/16 and 05/08/16 due to cytopenia and fever   2. Type 2 diabetes, steroids induced  -She was noticed to have increased blood glucose level lately, with random blood glucose above 200. She also developed diabetic symptoms. No history of diabetes in the past. -This is likely steroids induced hyperglycemia -I strongly recommend her to avoid any soft drink and sweats, and watch her carbohydrate intake, and exercise more  -She has been seen by her primary care physician. She was not given any medication to manage her glucose. I will call her primary care physician to discuss adding metformin. -I previously instructed her to take metformin every day since she is now taking dexamethasone more frequently.  -She restarted her dexamethasone weekly as part of his leukemia treatment, and she will also receive steroids as premedication, I recommend her to take metformin 500 mg twice daily continuously, to control her hyperglycemia. Potential side effects discussed, she agreed to proceed. - Glucose 166 on 4/5. Her sugar levels are high because of the steroids. I advised her to  take took [place] everyday and check her glucose levels at home - When she sees her PCP again, I have advised her to get a meter from him and be instructed on how to use it  3. Chronic Hepatitis B  - She will follow-up GI clinic at [facility] -continue entecavir. The patient is requesting a refill of entecavir. Our nursing staff has called her physician's office to request this for her.  4. GERD, recent GI bleeding and nausea  -Continue omeprazole daily. We previously discussed steroids can worsen her acid reflux. -She had an EGD at [facility] in 2017.  -- she did not do her colonoscopy because she did not have anyone to accompany her during visit. They will call to reschedule.  5. Constipation -I previously encouraged the patient to try senca-s, ducalax 1-3 times a day or milk of magnesium if miralax doresn't help with constipation  - I previously advised the patient to take something everyday to help with constipation.  6. Fever 05/07/2016 - Patient had a fever for 2 days of 100.4. She took 459m Ibrofrofen 4x a day - I will prescribe antibiotics Augmentin 875 mg for her to take twice daily for 7 days  -CXR today was negative  -afebrile today -held Cytoxan today   Plan  - X Ray today - Will receive dara infusion and velcade but no Cytoxan today - I will give her antibiotics twice a day for 7 days  - schedule future treatments for her -f/u in one week    All questions were answered. The patient knows to call the clinic with any problems, questions or concerns.  I spent 20 minutes counseling the patient face to face. We used the vedio interpreter service. The total time spent in the appointment was 25 minutes and more than 50% was on counseling.  This document serves as a record of services personally performed by YTruitt Merle MD. It was created on her behalf by  Brandt Loosen, a trained medical scribe. The creation of this record is based on the scribe's personal observations and the  provider's statements to them. This document has been checked and approved by the attending provider.   I have reviewed the above documentation for accuracy and completeness and I agree with the above.   Truitt Merle, MD 05/08/2016

## 2016-05-01 NOTE — Patient Instructions (Signed)
[facility] Discharge Instructions for Patients Receiving Chemotherapy  Today you received the following chemotherapy agents: Velcade and Darzalex.   To help prevent nausea and vomiting after your treatment, we encourage you to take your nausea medication as directed.   If you develop nausea and vomiting that is not controlled by your nausea medication, call the clinic.   BELOW ARE SYMPTOMS THAT SHOULD BE REPORTED IMMEDIATELY:  *FEVER GREATER THAN 100.5 F  *CHILLS WITH OR WITHOUT FEVER  NAUSEA AND VOMITING THAT IS NOT CONTROLLED WITH YOUR NAUSEA MEDICATION  *UNUSUAL SHORTNESS OF BREATH  *UNUSUAL BRUISING OR BLEEDING  TENDERNESS IN MOUTH AND THROAT WITH OR WITHOUT PRESENCE OF ULCERS  *URINARY PROBLEMS  *BOWEL PROBLEMS  UNUSUAL RASH Items with * indicate a potential emergency and should be followed up as soon as possible.  Feel free to call the clinic you have any questions or concerns. The clinic phone number is [phone].  Please show the CHEMO ALERT CARD at check-in to the Emergency Department and triage nurse.   

## 2016-05-01 NOTE — Progress Notes (Signed)
Per MD Burr Medico, hold cytoxan today due to [place]. Ok to give Velcade and Darzalex per MD Burr Medico

## 2016-05-08 ENCOUNTER — Ambulatory Visit (HOSPITAL_COMMUNITY)
Admission: RE | Admit: 2016-05-08 | Discharge: 2016-05-08 | Disposition: A | Discharge status: Home / Self Care | Payer: BLUE CROSS/BLUE SHIELD | Source: Ambulatory Visit | Attending: Hematology | Admitting: Hematology

## 2016-05-08 ENCOUNTER — Ambulatory Visit (HOSPITAL_BASED_OUTPATIENT_CLINIC_OR_DEPARTMENT_OTHER): Payer: BLUE CROSS/BLUE SHIELD | Admitting: Hematology

## 2016-05-08 ENCOUNTER — Ambulatory Visit (HOSPITAL_BASED_OUTPATIENT_CLINIC_OR_DEPARTMENT_OTHER): Payer: BLUE CROSS/BLUE SHIELD

## 2016-05-08 ENCOUNTER — Encounter: Payer: Self-pay | Admitting: Hematology

## 2016-05-08 ENCOUNTER — Other Ambulatory Visit (HOSPITAL_BASED_OUTPATIENT_CLINIC_OR_DEPARTMENT_OTHER): Payer: BLUE CROSS/BLUE SHIELD

## 2016-05-08 ENCOUNTER — Ambulatory Visit: Payer: BLUE CROSS/BLUE SHIELD

## 2016-05-08 VITALS — BP 99/66 | HR 73 | Temp 97.7°F | Resp 16

## 2016-05-08 DIAGNOSIS — E099 Drug or chemical induced diabetes mellitus without complications: Secondary | ICD-10-CM | POA: Diagnosis not present

## 2016-05-08 DIAGNOSIS — B181 Chronic viral hepatitis B without delta-agent: Secondary | ICD-10-CM

## 2016-05-08 DIAGNOSIS — K922 Gastrointestinal hemorrhage, unspecified: Secondary | ICD-10-CM | POA: Diagnosis not present

## 2016-05-08 DIAGNOSIS — Z95828 Presence of other vascular implants and grafts: Secondary | ICD-10-CM

## 2016-05-08 DIAGNOSIS — R110 Nausea: Secondary | ICD-10-CM | POA: Diagnosis not present

## 2016-05-08 DIAGNOSIS — K5900 Constipation, unspecified: Secondary | ICD-10-CM

## 2016-05-08 DIAGNOSIS — R509 Fever, unspecified: Secondary | ICD-10-CM | POA: Insufficient documentation

## 2016-05-08 DIAGNOSIS — C9011 Plasma cell leukemia in remission: Secondary | ICD-10-CM

## 2016-05-08 DIAGNOSIS — C9012 Plasma cell leukemia in relapse: Secondary | ICD-10-CM | POA: Diagnosis not present

## 2016-05-08 DIAGNOSIS — K219 Gastro-esophageal reflux disease without esophagitis: Secondary | ICD-10-CM | POA: Diagnosis not present

## 2016-05-08 DIAGNOSIS — C9010 Plasma cell leukemia not having achieved remission: Secondary | ICD-10-CM

## 2016-05-08 DIAGNOSIS — T380X5A Adverse effect of glucocorticoids and synthetic analogues, initial encounter: Secondary | ICD-10-CM

## 2016-05-08 DIAGNOSIS — D649 Anemia, unspecified: Secondary | ICD-10-CM

## 2016-05-08 DIAGNOSIS — Z5112 Encounter for antineoplastic immunotherapy: Secondary | ICD-10-CM | POA: Diagnosis not present

## 2016-05-08 LAB — COMPREHENSIVE METABOLIC PANEL
ALK PHOS: 146 U/L (ref 40–150)
ALT: 20 U/L (ref 0–55)
AST: 45 U/L — AB (ref 5–34)
Albumin: 3.3 g/dL — ABNORMAL LOW (ref 3.5–5.0)
Anion Gap: 10 mEq/L (ref 3–11)
BUN: 12.5 mg/dL (ref 7.0–26.0)
CALCIUM: 9.1 mg/dL (ref 8.4–10.4)
CO2: 22 mEq/L (ref 22–29)
CREATININE: 0.6 mg/dL (ref 0.6–1.1)
Chloride: 110 mEq/L — ABNORMAL HIGH (ref 98–109)
EGFR: >90 mL/min/{1.73_m2} (ref >90)
GLUCOSE: 166 mg/dL — AB (ref 70–140)
Potassium: 4.2 mEq/L (ref 3.5–5.1)
SODIUM: 142 meq/L (ref 136–145)
TOTAL PROTEIN: 6.5 g/dL (ref 6.4–8.3)

## 2016-05-08 LAB — CBC WITH DIFFERENTIAL/PLATELET
BASO%: 0.4 % (ref 0.0–2.0)
Basophils Absolute: 0 10*3/uL (ref 0.0–0.1)
EOS%: 0 % (ref 0.0–7.0)
Eosinophils Absolute: 0 10*3/uL (ref 0.0–0.5)
HEMATOCRIT: 33.9 % — AB (ref 34.8–46.6)
HEMOGLOBIN: 11.1 g/dL — AB (ref 11.6–15.9)
LYMPH#: 0.4 10*3/uL — AB (ref 0.9–3.3)
LYMPH%: 21.8 % (ref 14.0–49.7)
MCH: 28.6 pg (ref 25.1–34.0)
MCHC: 32.8 g/dL (ref 31.5–36.0)
MCV: 87.1 fL (ref 79.5–101.0)
MONO#: 0.1 10*3/uL (ref 0.1–0.9)
MONO%: 5.1 % (ref 0.0–14.0)
NEUT%: 72.7 % (ref 38.4–76.8)
NEUTROS ABS: 1.2 10*3/uL — AB (ref 1.5–6.5)
Platelets: 98 10*3/uL — ABNORMAL LOW (ref 145–400)
RBC: 3.89 10*6/uL (ref 3.70–5.45)
RDW: 19.2 % — AB (ref 11.2–14.5)
WBC: 1.6 10*3/uL — AB (ref 3.9–10.3)

## 2016-05-08 MED ORDER — SODIUM CHLORIDE 0.9 % IJ SOLN
10.0000 mL | INTRAMUSCULAR | Status: DC | PRN
Start: 2016-05-08 — End: 2016-05-08
  Administered 2016-05-08: 10 mL via INTRAVENOUS
  Filled 2016-05-08: qty 10

## 2016-05-08 MED ORDER — SODIUM CHLORIDE 0.9% FLUSH
10.0000 mL | INTRAVENOUS | Status: DC | PRN
  Filled 2016-05-08: qty 10

## 2016-05-08 MED ORDER — BORTEZOMIB CHEMO SQ INJECTION 3.5 MG (2.5MG/ML)
1.5000 mg/m2 | Freq: Once | INTRAMUSCULAR | Status: AC
  Administered 2016-05-08: 2.5 mg via SUBCUTANEOUS
  Filled 2016-05-08: qty 2.5

## 2016-05-08 MED ORDER — MONTELUKAST SODIUM 10 MG PO TABS
ORAL_TABLET | ORAL | Status: AC
  Filled 2016-05-08: qty 1

## 2016-05-08 MED ORDER — HEPARIN SOD (PORK) LOCK FLUSH 100 UNIT/ML IV SOLN
250.0000 [IU] | Freq: Once | INTRAVENOUS | Status: DC | PRN
  Filled 2016-05-08: qty 5

## 2016-05-08 MED ORDER — SODIUM CHLORIDE 0.9 % IV SOLN
Freq: Once | INTRAVENOUS | Status: AC
  Administered 2016-05-08: 10:00:00 via INTRAVENOUS

## 2016-05-08 MED ORDER — DIPHENHYDRAMINE HCL 25 MG PO CAPS
ORAL_CAPSULE | ORAL | Status: AC
  Filled 2016-05-08: qty 2

## 2016-05-08 MED ORDER — HEPARIN SOD (PORK) LOCK FLUSH 100 UNIT/ML IV SOLN
500.0000 [IU] | Freq: Once | INTRAVENOUS | Status: DC | PRN
  Filled 2016-05-08: qty 5

## 2016-05-08 MED ORDER — AMOXICILLIN-POT CLAVULANATE 875-125 MG PO TABS: 1.0000 | ORAL_TABLET | Freq: Two times a day (BID) | ORAL | 0 refills | Status: DC

## 2016-05-08 MED ORDER — METHYLPREDNISOLONE SODIUM SUCC 125 MG IJ SOLR
125.0000 mg | Freq: Once | INTRAMUSCULAR | Status: AC
  Administered 2016-05-08: 125 mg via INTRAVENOUS

## 2016-05-08 MED ORDER — ALTEPLASE 2 MG IJ SOLR
2.0000 mg | Freq: Once | INTRAMUSCULAR | Status: DC | PRN
  Filled 2016-05-08: qty 2

## 2016-05-08 MED ORDER — METHYLPREDNISOLONE SODIUM SUCC 125 MG IJ SOLR
INTRAMUSCULAR | Status: AC
  Filled 2016-05-08: qty 2

## 2016-05-08 MED ORDER — HEPARIN SOD (PORK) LOCK FLUSH 100 UNIT/ML IV SOLN
500.0000 [IU] | Freq: Once | INTRAVENOUS | Status: AC | PRN
  Administered 2016-05-08: 500 [IU]
  Filled 2016-05-08: qty 5

## 2016-05-08 MED ORDER — SODIUM CHLORIDE 0.9% FLUSH
3.0000 mL | Freq: Once | INTRAVENOUS | Status: DC | PRN
  Filled 2016-05-08: qty 10

## 2016-05-08 MED ORDER — SODIUM CHLORIDE 0.9 % IV SOLN
16.0000 mg/kg | Freq: Once | INTRAVENOUS | Status: AC
  Administered 2016-05-08: 900 mg via INTRAVENOUS
  Filled 2016-05-08: qty 40

## 2016-05-08 MED ORDER — ACETAMINOPHEN 325 MG PO TABS
650.0000 mg | ORAL_TABLET | Freq: Once | ORAL | Status: AC
  Administered 2016-05-08: 650 mg via ORAL

## 2016-05-08 MED ORDER — MONTELUKAST SODIUM 10 MG PO TABS
10.0000 mg | ORAL_TABLET | Freq: Once | ORAL | Status: AC
  Administered 2016-05-08: 10 mg via ORAL

## 2016-05-08 MED ORDER — PROCHLORPERAZINE MALEATE 10 MG PO TABS
10.0000 mg | ORAL_TABLET | Freq: Once | ORAL | Status: AC
  Administered 2016-05-08: 10 mg via ORAL

## 2016-05-08 MED ORDER — SODIUM CHLORIDE 0.9% FLUSH
10.0000 mL | INTRAVENOUS | Status: DC | PRN
  Administered 2016-05-08: 10 mL
  Filled 2016-05-08: qty 10

## 2016-05-08 MED ORDER — ACETAMINOPHEN 325 MG PO TABS
ORAL_TABLET | ORAL | Status: AC
  Filled 2016-05-08: qty 2

## 2016-05-08 MED ORDER — DIPHENHYDRAMINE HCL 25 MG PO CAPS
50.0000 mg | ORAL_CAPSULE | Freq: Once | ORAL | Status: AC
  Administered 2016-05-08: 50 mg via ORAL

## 2016-05-08 MED ORDER — PROCHLORPERAZINE MALEATE 10 MG PO TABS
ORAL_TABLET | ORAL | Status: AC
  Filled 2016-05-08: qty 1

## 2016-05-08 MED ORDER — SODIUM CHLORIDE 0.9 % IV SOLN: Freq: Once | INTRAVENOUS | Status: DC

## 2016-05-08 NOTE — Progress Notes (Signed)
Per MD, proceed with treatment today, HOLD Cytoxan due to blood counts.   Wylene Simmer, BSN, RN 05/08/2016 10:11 AM

## 2016-05-08 NOTE — Progress Notes (Signed)
Patient received pre-medications and then transported to Xray by other RN per MD.

## 2016-05-08 NOTE — Patient Instructions (Signed)
[facility] Discharge Instructions for Patients Receiving Chemotherapy  Today you received the following chemotherapy agents: Velcade and Darzalex.   To help prevent nausea and vomiting after your treatment, we encourage you to take your nausea medication as directed.   If you develop nausea and vomiting that is not controlled by your nausea medication, call the clinic.   BELOW ARE SYMPTOMS THAT SHOULD BE REPORTED IMMEDIATELY:  *FEVER GREATER THAN 100.5 F  *CHILLS WITH OR WITHOUT FEVER  NAUSEA AND VOMITING THAT IS NOT CONTROLLED WITH YOUR NAUSEA MEDICATION  *UNUSUAL SHORTNESS OF BREATH  *UNUSUAL BRUISING OR BLEEDING  TENDERNESS IN MOUTH AND THROAT WITH OR WITHOUT PRESENCE OF ULCERS  *URINARY PROBLEMS  *BOWEL PROBLEMS  UNUSUAL RASH Items with * indicate a potential emergency and should be followed up as soon as possible.  Feel free to call the clinic you have any questions or concerns. The clinic phone number is [phone].  Please show the CHEMO ALERT CARD at check-in to the Emergency Department and triage nurse.   

## 2016-05-09 ENCOUNTER — Telehealth: Payer: Self-pay

## 2016-05-09 LAB — KAPPA/LAMBDA LIGHT CHAINS
Ig Kappa Free Light Chain: 4.9 mg/L (ref 3.3–19.4)
Ig Lambda Free Light Chain: 12.1 mg/L (ref 5.7–26.3)
KAPPA/LAMBDA FLC RATIO: 0.4 (ref 0.26–1.65)

## 2016-05-09 NOTE — Telephone Encounter (Signed)
Family called that pharmacy did not have antibiotic prescription. Called pharmacy and they worked on the rx this morning. Called family back and lvm that rx is ready for pick up at [facility] on [address].

## 2016-05-11 ENCOUNTER — Inpatient Hospital Stay (HOSPITAL_COMMUNITY)
Admission: EM | Admit: 2016-05-11 | Discharge: 2016-05-16 | DRG: 871 | Disposition: A | Discharge status: Home / Self Care | Payer: BLUE CROSS/BLUE SHIELD | Attending: Internal Medicine | Admitting: Internal Medicine

## 2016-05-11 ENCOUNTER — Emergency Department (HOSPITAL_COMMUNITY): Payer: BLUE CROSS/BLUE SHIELD

## 2016-05-11 ENCOUNTER — Encounter (HOSPITAL_COMMUNITY): Payer: Self-pay | Admitting: Emergency Medicine

## 2016-05-11 ENCOUNTER — Other Ambulatory Visit: Payer: Self-pay

## 2016-05-11 DIAGNOSIS — R0902 Hypoxemia: Secondary | ICD-10-CM | POA: Diagnosis present

## 2016-05-11 DIAGNOSIS — J189 Pneumonia, unspecified organism: Secondary | ICD-10-CM | POA: Diagnosis present

## 2016-05-11 DIAGNOSIS — E099 Drug or chemical induced diabetes mellitus without complications: Secondary | ICD-10-CM | POA: Diagnosis present

## 2016-05-11 DIAGNOSIS — Z79899 Other long term (current) drug therapy: Secondary | ICD-10-CM | POA: Diagnosis not present

## 2016-05-11 DIAGNOSIS — T451X5A Adverse effect of antineoplastic and immunosuppressive drugs, initial encounter: Secondary | ICD-10-CM | POA: Diagnosis present

## 2016-05-11 DIAGNOSIS — D72819 Decreased white blood cell count, unspecified: Secondary | ICD-10-CM

## 2016-05-11 DIAGNOSIS — Z825 Family history of asthma and other chronic lower respiratory diseases: Secondary | ICD-10-CM

## 2016-05-11 DIAGNOSIS — E876 Hypokalemia: Secondary | ICD-10-CM | POA: Diagnosis present

## 2016-05-11 DIAGNOSIS — K219 Gastro-esophageal reflux disease without esophagitis: Secondary | ICD-10-CM | POA: Diagnosis present

## 2016-05-11 DIAGNOSIS — Y95 Nosocomial condition: Secondary | ICD-10-CM | POA: Diagnosis present

## 2016-05-11 DIAGNOSIS — R112 Nausea with vomiting, unspecified: Secondary | ICD-10-CM | POA: Diagnosis present

## 2016-05-11 DIAGNOSIS — Z7984 Long term (current) use of oral hypoglycemic drugs: Secondary | ICD-10-CM

## 2016-05-11 DIAGNOSIS — J111 Influenza due to unidentified influenza virus with other respiratory manifestations: Secondary | ICD-10-CM | POA: Diagnosis not present

## 2016-05-11 DIAGNOSIS — R110 Nausea: Secondary | ICD-10-CM | POA: Diagnosis not present

## 2016-05-11 DIAGNOSIS — D6481 Anemia due to antineoplastic chemotherapy: Secondary | ICD-10-CM | POA: Diagnosis present

## 2016-05-11 DIAGNOSIS — R509 Fever, unspecified: Secondary | ICD-10-CM | POA: Diagnosis not present

## 2016-05-11 DIAGNOSIS — R1110 Vomiting, unspecified: Secondary | ICD-10-CM

## 2016-05-11 DIAGNOSIS — D709 Neutropenia, unspecified: Secondary | ICD-10-CM | POA: Diagnosis not present

## 2016-05-11 DIAGNOSIS — J1001 Influenza due to other identified influenza virus with the same other identified influenza virus pneumonia: Secondary | ICD-10-CM | POA: Diagnosis present

## 2016-05-11 DIAGNOSIS — A419 Sepsis, unspecified organism: Secondary | ICD-10-CM | POA: Diagnosis not present

## 2016-05-11 DIAGNOSIS — Z800 Family history of malignant neoplasm of digestive organs: Secondary | ICD-10-CM | POA: Diagnosis not present

## 2016-05-11 DIAGNOSIS — D701 Agranulocytosis secondary to cancer chemotherapy: Secondary | ICD-10-CM | POA: Diagnosis present

## 2016-05-11 DIAGNOSIS — D696 Thrombocytopenia, unspecified: Secondary | ICD-10-CM | POA: Diagnosis not present

## 2016-05-11 DIAGNOSIS — C9012 Plasma cell leukemia in relapse: Secondary | ICD-10-CM

## 2016-05-11 DIAGNOSIS — D6959 Other secondary thrombocytopenia: Secondary | ICD-10-CM | POA: Diagnosis present

## 2016-05-11 DIAGNOSIS — D630 Anemia in neoplastic disease: Secondary | ICD-10-CM | POA: Diagnosis present

## 2016-05-11 DIAGNOSIS — Z7952 Long term (current) use of systemic steroids: Secondary | ICD-10-CM | POA: Diagnosis not present

## 2016-05-11 DIAGNOSIS — Z95828 Presence of other vascular implants and grafts: Secondary | ICD-10-CM

## 2016-05-11 DIAGNOSIS — R630 Anorexia: Secondary | ICD-10-CM | POA: Diagnosis not present

## 2016-05-11 DIAGNOSIS — A4189 Other specified sepsis: Secondary | ICD-10-CM | POA: Diagnosis present

## 2016-05-11 DIAGNOSIS — D759 Disease of blood and blood-forming organs, unspecified: Secondary | ICD-10-CM | POA: Diagnosis not present

## 2016-05-11 DIAGNOSIS — R5081 Fever presenting with conditions classified elsewhere: Secondary | ICD-10-CM | POA: Diagnosis not present

## 2016-05-11 DIAGNOSIS — Z87442 Personal history of urinary calculi: Secondary | ICD-10-CM | POA: Diagnosis not present

## 2016-05-11 DIAGNOSIS — J101 Influenza due to other identified influenza virus with other respiratory manifestations: Secondary | ICD-10-CM | POA: Diagnosis not present

## 2016-05-11 LAB — CBC WITH DIFFERENTIAL/PLATELET
BASOS ABS: 0 10*3/uL (ref 0.0–0.1)
BASOS PCT: 0 %
Basophils Absolute: 0 10*3/uL (ref 0.0–0.1)
Basophils Relative: 0 %
Eosinophils Absolute: 0 10*3/uL (ref 0.0–0.7)
Eosinophils Absolute: 0 10*3/uL (ref 0.0–0.7)
Eosinophils Relative: 0 %
Eosinophils Relative: 0 %
HCT: 32.8 % — ABNORMAL LOW (ref 36.0–46.0)
HEMATOCRIT: 29.7 % — AB (ref 36.0–46.0)
Hemoglobin: 10.8 g/dL — ABNORMAL LOW (ref 12.0–15.0)
Hemoglobin: 9.6 g/dL — ABNORMAL LOW (ref 12.0–15.0)
LYMPHS ABS: 0.7 10*3/uL (ref 0.7–4.0)
LYMPHS ABS: 0.5 10*3/uL — AB (ref 0.7–4.0)
Lymphocytes Relative: 23 %
Lymphocytes Relative: 24 %
MCH: 28.7 pg (ref 26.0–34.0)
MCH: 28.2 pg (ref 26.0–34.0)
MCHC: 32.9 g/dL (ref 30.0–36.0)
MCHC: 32.3 g/dL (ref 30.0–36.0)
MCV: 87.2 fL (ref 78.0–100.0)
MCV: 87.4 fL (ref 78.0–100.0)
MONOS PCT: 7 %
Monocytes Absolute: 0.3 10*3/uL (ref 0.1–1.0)
Monocytes Absolute: 0.2 10*3/uL (ref 0.1–1.0)
Monocytes Relative: 10 %
NEUTROS ABS: 1.9 10*3/uL (ref 1.7–7.7)
NEUTROS PCT: 67 %
NEUTROS PCT: 69 %
Neutro Abs: 1.5 10*3/uL — ABNORMAL LOW (ref 1.7–7.7)
PLATELETS: 57 10*3/uL — AB (ref 150–400)
PLATELETS: 45 10*3/uL — AB (ref 150–400)
RBC: 3.76 MIL/uL — ABNORMAL LOW (ref 3.87–5.11)
RBC: 3.4 MIL/uL — AB (ref 3.87–5.11)
RDW: 17.4 % — AB (ref 11.5–15.5)
RDW: 17.3 % — AB (ref 11.5–15.5)
WBC MORPHOLOGY: INCREASED
WBC: 2.9 10*3/uL — ABNORMAL LOW (ref 4.0–10.5)
WBC: 2.2 10*3/uL — AB (ref 4.0–10.5)

## 2016-05-11 LAB — URINALYSIS, ROUTINE W REFLEX MICROSCOPIC
BILIRUBIN URINE: NEGATIVE
GLUCOSE, UA: NEGATIVE mg/dL
Hgb urine dipstick: NEGATIVE
KETONES UR: NEGATIVE mg/dL
Leukocytes, UA: NEGATIVE
NITRITE: NEGATIVE
PH: 7 (ref 5.0–8.0)
Protein, ur: NEGATIVE mg/dL
SPECIFIC GRAVITY, URINE: 1.014 (ref 1.005–1.030)

## 2016-05-11 LAB — COMPREHENSIVE METABOLIC PANEL
ALBUMIN: 3 g/dL — AB (ref 3.5–5.0)
ALK PHOS: 124 U/L (ref 38–126)
ALT: 18 U/L (ref 14–54)
ANION GAP: 8 (ref 5–15)
AST: 49 U/L — ABNORMAL HIGH (ref 15–41)
BILIRUBIN TOTAL: 0.3 mg/dL (ref 0.3–1.2)
BUN: 16 mg/dL (ref 6–20)
CALCIUM: 9.2 mg/dL (ref 8.9–10.3)
CO2: 26 mmol/L (ref 22–32)
CREATININE: 0.57 mg/dL (ref 0.44–1.00)
Chloride: 102 mmol/L (ref 101–111)
GFR calc Af Amer: >60 mL/min (ref >60)
GFR calc non Af Amer: >60 mL/min (ref >60)
GLUCOSE: 82 mg/dL (ref 65–99)
Potassium: 3.8 mmol/L (ref 3.5–5.1)
SODIUM: 136 mmol/L (ref 135–145)
TOTAL PROTEIN: 6 g/dL — AB (ref 6.5–8.1)

## 2016-05-11 LAB — MRSA PCR SCREENING: MRSA BY PCR: NEGATIVE

## 2016-05-11 LAB — I-STAT CG4 LACTIC ACID, ED
Lactic Acid, Venous: 0.79 mmol/L (ref 0.5–1.9)
Lactic Acid, Venous: 0.68 mmol/L (ref 0.5–1.9)

## 2016-05-11 LAB — GLUCOSE, CAPILLARY
Glucose-Capillary: 73 mg/dL (ref 65–99)
Glucose-Capillary: 129 mg/dL — ABNORMAL HIGH (ref 65–99)

## 2016-05-11 LAB — INFLUENZA PANEL BY PCR (TYPE A & B)
INFLBPCR: POSITIVE — AB
Influenza A By PCR: NEGATIVE

## 2016-05-11 LAB — PROTIME-INR
INR: 0.92
Prothrombin Time: 12.3 seconds (ref 11.4–15.2)

## 2016-05-11 MED ORDER — ACETAMINOPHEN 325 MG PO TABS
650.0000 mg | ORAL_TABLET | Freq: Once | ORAL | Status: AC
  Administered 2016-05-11: 650 mg via ORAL
  Filled 2016-05-11: qty 2

## 2016-05-11 MED ORDER — SODIUM CHLORIDE 0.9 % IV BOLUS (SEPSIS)
1000.0000 mL | Freq: Once | INTRAVENOUS | Status: AC
  Administered 2016-05-11: 1000 mL via INTRAVENOUS

## 2016-05-11 MED ORDER — INSULIN ASPART 100 UNIT/ML ~~LOC~~ SOLN: 0.0000 [IU] | Freq: Every day | SUBCUTANEOUS | Status: DC

## 2016-05-11 MED ORDER — PANTOPRAZOLE SODIUM 40 MG IV SOLR
40.0000 mg | INTRAVENOUS | Status: DC
  Administered 2016-05-11 – 2016-05-12 (×2): 40 mg via INTRAVENOUS
  Filled 2016-05-11 (×2): qty 40

## 2016-05-11 MED ORDER — ACETAMINOPHEN 325 MG PO TABS
650.0000 mg | ORAL_TABLET | Freq: Four times a day (QID) | ORAL | Status: DC | PRN
  Administered 2016-05-12 – 2016-05-13 (×2): 650 mg via ORAL
  Filled 2016-05-11 (×3): qty 2

## 2016-05-11 MED ORDER — ACETAMINOPHEN 650 MG RE SUPP: 650.0000 mg | Freq: Four times a day (QID) | RECTAL | Status: DC | PRN

## 2016-05-11 MED ORDER — ENTECAVIR 0.5 MG PO TABS
0.5000 mg | ORAL_TABLET | Freq: Every day | ORAL | Status: DC
  Administered 2016-05-12 – 2016-05-15 (×4): 0.5 mg via ORAL

## 2016-05-11 MED ORDER — DEXAMETHASONE 4 MG PO TABS: 4.0000 mg | ORAL_TABLET | Freq: Two times a day (BID) | ORAL | Status: DC

## 2016-05-11 MED ORDER — VANCOMYCIN HCL IN DEXTROSE 750-5 MG/150ML-% IV SOLN
750.0000 mg | Freq: Two times a day (BID) | INTRAVENOUS | Status: DC
  Administered 2016-05-11: 750 mg via INTRAVENOUS
  Filled 2016-05-11 (×2): qty 150

## 2016-05-11 MED ORDER — HYDROCORTISONE NA SUCCINATE PF 100 MG IJ SOLR
100.0000 mg | Freq: Three times a day (TID) | INTRAMUSCULAR | Status: DC
  Administered 2016-05-11 – 2016-05-12 (×3): 100 mg via INTRAVENOUS
  Filled 2016-05-11 (×3): qty 2

## 2016-05-11 MED ORDER — INSULIN ASPART 100 UNIT/ML ~~LOC~~ SOLN
0.0000 [IU] | Freq: Three times a day (TID) | SUBCUTANEOUS | Status: DC
  Administered 2016-05-12: 2 [IU] via SUBCUTANEOUS
  Administered 2016-05-14 – 2016-05-15 (×2): 1 [IU] via SUBCUTANEOUS

## 2016-05-11 MED ORDER — ONDANSETRON HCL 4 MG/2ML IJ SOLN
4.0000 mg | Freq: Four times a day (QID) | INTRAMUSCULAR | Status: DC
  Administered 2016-05-11 – 2016-05-15 (×15): 4 mg via INTRAVENOUS
  Filled 2016-05-11 (×15): qty 2

## 2016-05-11 MED ORDER — OSELTAMIVIR PHOSPHATE 75 MG PO CAPS
75.0000 mg | ORAL_CAPSULE | Freq: Two times a day (BID) | ORAL | Status: AC
  Administered 2016-05-11 – 2016-05-16 (×10): 75 mg via ORAL
  Filled 2016-05-11 (×10): qty 1

## 2016-05-11 MED ORDER — LEVOFLOXACIN IN D5W 750 MG/150ML IV SOLN
750.0000 mg | INTRAVENOUS | Status: DC
  Administered 2016-05-11: 750 mg via INTRAVENOUS
  Filled 2016-05-11: qty 150

## 2016-05-11 MED ORDER — SODIUM CHLORIDE 0.9 % IV SOLN
INTRAVENOUS | Status: DC
  Administered 2016-05-11 – 2016-05-15 (×4): via INTRAVENOUS

## 2016-05-11 MED ORDER — ONDANSETRON HCL 4 MG/2ML IJ SOLN: 4.0000 mg | Freq: Four times a day (QID) | INTRAMUSCULAR | Status: DC | PRN

## 2016-05-11 MED ORDER — SODIUM CHLORIDE 0.9 % IV SOLN
INTRAVENOUS | Status: DC
  Administered 2016-05-11 – 2016-05-12 (×3): via INTRAVENOUS

## 2016-05-11 MED ORDER — VANCOMYCIN HCL IN DEXTROSE 1-5 GM/200ML-% IV SOLN
1000.0000 mg | Freq: Once | INTRAVENOUS | Status: AC
  Administered 2016-05-11: 1000 mg via INTRAVENOUS
  Filled 2016-05-11: qty 200

## 2016-05-11 MED ORDER — DEXTROSE 5 % IV SOLN
2.0000 g | Freq: Once | INTRAVENOUS | Status: AC
  Administered 2016-05-11: 2 g via INTRAVENOUS
  Filled 2016-05-11: qty 2

## 2016-05-11 MED ORDER — PANTOPRAZOLE SODIUM 40 MG PO TBEC: 40.0000 mg | DELAYED_RELEASE_TABLET | Freq: Every day | ORAL | Status: DC

## 2016-05-11 MED ORDER — ACYCLOVIR 800 MG PO TABS: 800.0000 mg | ORAL_TABLET | Freq: Two times a day (BID) | ORAL | Status: DC

## 2016-05-11 MED ORDER — ONDANSETRON HCL 4 MG PO TABS: 4.0000 mg | ORAL_TABLET | Freq: Four times a day (QID) | ORAL | Status: DC | PRN

## 2016-05-11 NOTE — Progress Notes (Signed)
Pharmacy Antibiotic Note  Sheryl [name] is a 46 y.o. female with acute plasma cell leukemia presenting on 05/11/2016 with fever, SOB, and cough. Unable to complete chemo on 4/5 and developed worsening symptoms over the weekend. Flu panel positive for Influenza B and CXR suggestive of PNA. Pharmacy has been consulted to dose vancomycin and Levaquin for HCAP.  Plan:  Vancomycin 1000 mg IV now, then 750 mg IV q12 hr; goal trough 15-20 mcg/mL  Measure vancomycin trough levels at steady state as indicated  F/u results of MRSA PCR  Levaquin 750 mg IV q24 hr  Consider resuming acyclovir for VZV ppx while on Velcade     Temp (24hrs), Avg:101.7 F (38.7 C), Min:101.7 F (38.7 C), Max:101.7 F (38.7 C)   Recent Labs Lab 05/08/16 0804 05/11/16 1111 05/11/16 1140  WBC 1.6* 2.9*  --   CREATININE 0.6 0.57  --   LATICACIDVEN  --   --  0.79    CrCl cannot be calculated (Unknown ideal weight.).    No Known Allergies  Antimicrobials this admission: Cefepime x 1 in ED Vancomycin 4/8 >>  Levaquin 4/8 >>  Tamiflu  Dose adjustments this admission: ---  Microbiology results: 4/8 BCx: sent 4/8 Influenza panel: flu B+ Hx ESBL E coli in urine   Thank you for allowing pharmacy to be a part of this patient's care.  Reuel Boom, PharmD, BCPS Pager: [phone] 05/11/2016, 2:56 PM

## 2016-05-11 NOTE — ED Triage Notes (Signed)
Pt c/o dizziness, SOB, cough, fever, emesis onset Friday. Hx leukemia, last chemo Thursday. Wheezes throughout. Speaks vietnamese only, family member translates.

## 2016-05-11 NOTE — ED Provider Notes (Signed)
[facility] DEPT Provider Note   CSN: [id number] Arrival date & time: 05/11/16  1033     History   Chief Complaint Chief Complaint  Patient presents with  . Fever    cancer  . Shortness of Breath    HPI Sheryl [name] is a 46 y.o. female.  HPI Pt speaks vietnamese, niece is translating at bedside  On Thursday supposed to have Chemo but WBC were low, and unable to complete it. Then on Friday developed fever to 100.2-100.4.  Cough also starting Friday, fatigue. Sometimes dry cough, pink sputum.  Congestion, sore throat. Nausea, vomiting, diarrhea. 3 times a day diarrhea.  Vomiting more than that.  Sometimes has with chemo. Daughter has fever too.   Saw Dr. Burr Medico here, was referrred and seeing [facility] for stem cell transplant  Past Medical History:  Diagnosis Date  . Chills with fever    intermittently since d/c from hospital  . Dysuria-frequency syndrome    w/ pink urine  . GERD (gastroesophageal reflux disease)   . Hepatitis   . History of positive PPD    DX 2011--  CXR DONE NO EVIDENCE  . History of ureter stent   . Hydronephrosis, right   . Neuromuscular disorder (HCC)    legs numb intermittently  . 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[place])   . Pneumonia   . Right ureteral stone   . Urosepsis 8/14   admitted to [facility]    Patient Active Problem List   Diagnosis Date Noted  . Hypersensitivity reaction 04/18/2016  . Hemorrhoid 12/03/2015  . Headache 12/03/2015  . Dehydration 11/14/2015  . Fever 11/14/2015  . 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 Port catheter in place 05/30/2015  . Acute pyelonephritis   . Immunosuppressed status ([place])   . UTI (urinary tract infection) 05/21/2015  . Sepsis ([place]) 05/20/2015  . Hepatitis 10/24/2014  . 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[place]) 10/24/2014  . GIB (gastrointestinal bleeding) 10/07/2014  . Healthcare-associated pneumonia 10/07/2014  . Constipation 09/17/2014  . Leukocytosis 09/14/2014  . Nausea & vomiting 09/14/2014  . Abdominal pain  09/14/2014  . Abnormal LFTs 09/14/2014  . Bilateral leg numbness 09/14/2014  . Normocytic anemia 09/14/2014  . GERD (gastroesophageal reflux disease)   . Hepatitis B   . Gastroesophageal reflux disease without esophagitis     Past Surgical History:  Procedure Laterality Date  . CYSTOSCOPY W/ URETERAL STENT PLACEMENT Right 09/25/2012   Procedure: CYSTOSCOPY WITH RETROGRADE PYELOGRAM/URETERAL STENT PLACEMENT;  Surgeon: Alexis Frock, MD;  Location: [facility];  Service: Urology;  Laterality: Right;  . CYSTOSCOPY WITH RETROGRADE PYELOGRAM, URETEROSCOPY AND STENT PLACEMENT Right 10/15/2012   Procedure: CYSTOSCOPY WITH RETROGRADE PYELOGRAM, URETEROSCOPY AND REMOVAL STENT WITH  STENT PLACEMENT;  Surgeon: Alexis Frock, MD;  [facility];  Service: Urology;  Laterality: Right;  . ESOPHAGOGASTRODUODENOSCOPY (EGD) WITH PROPOFOL N/A 11/16/2014   Procedure: ESOPHAGOGASTRODUODENOSCOPY (EGD) WITH PROPOFOL;  Surgeon: Milus Banister, MD;  Location: [facility];  Service: Endoscopy;  Laterality: N/A;  . HOLMIUM LASER APPLICATION Right 3/41/9622   Procedure: HOLMIUM LASER APPLICATION;  Surgeon: Alexis Frock, MD;  Location: [facility];  Service: Urology;  Laterality: Right;  . LIVER BIOPSY    . OTHER SURGICAL HISTORY Right    removal of ovarian cyst  . removal of uterine cyst     years ago  . RIGHT VATS W/ DRAINAGE PEURAL EFFUSION AND BX'S  10-30-2008    OB History    No data available       Home  Medications    Prior to Admission medications   Medication Sig Start Date End Date Taking? Authorizing Provider  acyclovir (ZOVIRAX) 800 MG tablet Take 1 tablet (800 mg total) by mouth 2 (two) times daily. 04/18/16  Yes Truitt Merle, MD  amoxicillin-clavulanate (AUGMENTIN) 875-125 MG tablet Take 1 tablet by mouth 2 (two) times daily. 05/08/16  Yes Truitt Merle, MD  dexamethasone (DECADRON) 4 MG tablet Take 1 tablet (4 mg total) by mouth 2 (two) times daily with a meal.  04/09/16  Yes Truitt Merle, MD  entecavir (BARACLUDE) 0.5 MG tablet Take 0.5 mg by mouth daily.   Yes Historical Provider, MD  esomeprazole (NEXIUM) 40 MG capsule Take 1 capsule (40 mg total) by mouth daily at 12 noon. 11/20/15  Yes Truitt Merle, MD  metFORMIN (GLUCOPHAGE) 500 MG tablet Take 1 tablet (500 mg total) by mouth 2 (two) times daily with a meal. 02/27/16  Yes Truitt Merle, MD  montelukast (SINGULAIR) 10 MG tablet Take 1 tablet (10 mg total) by mouth once a week. 04/18/16  Yes Truitt Merle, MD  omeprazole (PRILOSEC) 20 MG capsule Take 1 capsule (20 mg total) by mouth daily. 04/18/16  Yes Truitt Merle, MD  ondansetron (ZOFRAN) 8 MG tablet Take 1 tablet (8 mg total) by mouth every 8 (eight) hours as needed for nausea or vomiting. 02/18/16  Yes Truitt Merle, MD    Family History Family History  Problem Relation Age of Onset  . Stomach cancer Mother   . Lung disease Father   . Asthma Father     Social History Social History  Substance Use Topics  . Smoking status: Never Smoker  . Smokeless tobacco: Never Used  . Alcohol use No     Allergies   Patient has no known allergies.   Review of Systems Review of Systems  Constitutional: Positive for fever.  HENT: Positive for congestion and sore throat.   Eyes: Negative for visual disturbance.  Respiratory: Positive for cough and shortness of breath.   Cardiovascular: Positive for chest pain (pain with deep breaths and nausea).  Gastrointestinal: Positive for abdominal pain, diarrhea, nausea and vomiting.  Genitourinary: Negative for difficulty urinating and dysuria.  Musculoskeletal: Negative for back pain and neck pain.  Skin: Negative for rash.  Neurological: Positive for dizziness, light-headedness and headaches. Negative for syncope.     Physical Exam Updated Vital Signs BP (!) 100/59   Pulse (!) 102   Temp (!) 101.7 F (38.7 C) (Oral)   Resp 16   SpO2 100%   Physical Exam  Constitutional: She is oriented to person, place, and time. She  appears well-developed and well-nourished. She appears ill. No distress.  HENT:  Head: Normocephalic and atraumatic.  Eyes: Conjunctivae and EOM are normal.  Neck: Normal range of motion.  Cardiovascular: Regular rhythm, normal heart sounds and intact distal pulses.  Tachycardia present.  Exam reveals no gallop and no friction rub.   No murmur heard. Pulmonary/Chest: Effort normal. No respiratory distress. She has no wheezes. She has rales in the right lower field.  Abdominal: Soft. She exhibits no distension. There is no tenderness. There is no guarding.  Musculoskeletal: She exhibits no edema or tenderness.  Neurological: She is alert and oriented to person, place, and time.  Skin: Skin is warm and dry. No rash noted. She is not diaphoretic. No erythema.  Nursing note and vitals reviewed.    ED Treatments / Results  Labs (all labs ordered are listed, but only abnormal results are displayed)  Labs Reviewed  COMPREHENSIVE METABOLIC PANEL - Abnormal; Notable for the following:       Result Value   Total Protein 6.0 (*)    Albumin 3.0 (*)    AST 49 (*)    All other components within normal limits  CBC WITH DIFFERENTIAL/PLATELET - Abnormal; Notable for the following:    WBC 2.9 (*)    RBC 3.76 (*)    Hemoglobin 10.8 (*)    HCT 32.8 (*)    RDW 17.4 (*)    Platelets 57 (*)    All other components within normal limits  URINALYSIS, ROUTINE W REFLEX MICROSCOPIC - Abnormal; Notable for the following:    APPearance CLOUDY (*)    All other components within normal limits  CULTURE, BLOOD (ROUTINE X 2)  CULTURE, BLOOD (ROUTINE X 2)  PROTIME-INR  INFLUENZA PANEL BY PCR (TYPE A & B)  I-STAT CG4 LACTIC ACID, ED  I-STAT CG4 LACTIC ACID, ED    EKG  EKG Interpretation None       Radiology Dg Chest 2 View  Result Date: 05/11/2016 CLINICAL DATA:  Shortness of breath, wheezing EXAM: CHEST  2 VIEW COMPARISON:  05/08/2016 FINDINGS: Mild patchy bilateral lower lobe opacities, atelectasis  versus pneumonia. No pleural effusion or pneumothorax. The heart is top-normal in size. Right chest power port terminates at cavoatrial junction. IMPRESSION: Mild patchy bilateral lower lobe opacities, atelectasis versus pneumonia. Electronically Signed   By: Julian Hy M.D.   On: 05/11/2016 11:52    Procedures Procedures (including critical care time)  Medications Ordered in ED Medications  sodium chloride 0.9 % bolus 1,000 mL (1,000 mLs Intravenous New Bag/Given 05/11/16 1226)  sodium chloride 0.9 % bolus 1,000 mL (0 mLs Intravenous Stopped 05/11/16 1226)  ceFEPIme (MAXIPIME) 2 g in dextrose 5 % 50 mL IVPB (0 g Intravenous Stopped 05/11/16 1227)  vancomycin (VANCOCIN) IVPB 1000 mg/200 mL premix (1,000 mg Intravenous New Bag/Given 05/11/16 1228)  acetaminophen (TYLENOL) tablet 650 mg (650 mg Oral Given 05/11/16 1158)     Initial Impression / Assessment and Plan / ED Course  I have reviewed the triage vital signs and the nursing notes.  Pertinent labs & imaging results that were available during my care of the patient were reviewed by me and considered in my medical decision making (see chart for details).     46 year old female who speaks [place] with history of right hydronephrosis status post ureteral stent placement, GERD, hepatitis B, diagnosis of plasma cell leukemia, with treatment by autologous stem cell transplant who presents with concern for fever, cough, congestion.  Patient febrile on arrival to the emergency department.  Blood pressures appear to be at baseline when looking at prior visits. Given symptoms concerning for pneumonia, she is given empiric vancomycin and cefepime as well as 2 L of normal saline. Blood cultures were drawn. Chest x-ray confirms presence of likely pneumonia. Influenza is currently pending. Lactic acid WNL.  Will admit for further concern for fever and pneumonia.    Final Clinical Impressions(s) / ED Diagnoses   Final diagnoses:  HCAP  (healthcare-associated pneumonia)  Fever, unspecified fever cause    New Prescriptions New Prescriptions   No medications on file     Gareth Morgan, MD 05/11/16 1349

## 2016-05-11 NOTE — ED Notes (Signed)
Bed: WA15 Expected date:  Expected time:  Means of arrival:  Comments: 

## 2016-05-11 NOTE — ED Notes (Signed)
Report phoned to [place], RN on [address]. Pt. Remains in no distress.

## 2016-05-11 NOTE — H&P (Addendum)
History and Physical    Sheryl [name] RDE:[id number] DOB: 01-Nov-1970 DOA: 05/11/2016    PCP: [name], MD  Patient coming from: home  Chief Complaint: cough  HPI: Sheryl [name] is a 46 y.o. 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  Family member is translating over the phone.  She also has a runny nose.  She was started on Augmentin a couple of days ago.  She had 5 episodes of vomiting yesterday an about 3 episodes of diarrhea. She has "chest pain" but points to her epigastrium. She states it only hurts when she has a fever. No sick contacts.   ED Course: fever 101.7, HR in low 100s, CXR with b/l infiltrates, Infleunza +  Review of Systems:  Having lower back pain- points to sacrum.  All other systems reviewed and apart from HPI, are negative.  Past Medical History:  Diagnosis Date  . Chills with fever    intermittently since d/c from hospital  . Dysuria-frequency syndrome    w/ pink urine  . GERD (gastroesophageal reflux disease)   . Hepatitis   . History of positive PPD    DX 2011--  CXR DONE NO EVIDENCE  . History of ureter stent   . Hydronephrosis, right   . Neuromuscular disorder (HCC)    legs numb intermittently  . 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[place])   . Pneumonia   . Right ureteral stone   . Urosepsis 8/14   [facility]    Past Surgical History:  Procedure Laterality Date  . CYSTOSCOPY W/ URETERAL STENT PLACEMENT Right 09/25/2012   Procedure: CYSTOSCOPY WITH RETROGRADE PYELOGRAM/URETERAL STENT PLACEMENT;  Surgeon: Alexis Frock, MD;  [facility]: [facility];  Service: Urology;  Laterality: Right;  . CYSTOSCOPY WITH RETROGRADE PYELOGRAM, URETEROSCOPY AND STENT PLACEMENT Right 10/15/2012   Procedure: CYSTOSCOPY WITH RETROGRADE PYELOGRAM, URETEROSCOPY AND REMOVAL STENT WITH  STENT PLACEMENT;  Surgeon: Alexis Frock, MD;  Location: [facility];  Service: Urology;  Laterality: Right;  . ESOPHAGOGASTRODUODENOSCOPY (EGD) WITH PROPOFOL N/A 11/16/2014   Procedure: ESOPHAGOGASTRODUODENOSCOPY (EGD) WITH PROPOFOL;  Surgeon: Milus Banister, MD;  [facility];  Service: Endoscopy;  Laterality: N/A;  . HOLMIUM LASER APPLICATION Right 6/31/4970   Procedure: HOLMIUM LASER APPLICATION;  Surgeon: Alexis Frock, MD;  Location: [facility];  Service: Urology;  Laterality: Right;  . LIVER BIOPSY    . OTHER SURGICAL HISTORY Right    removal of ovarian cyst  . removal of uterine cyst     years ago  . RIGHT VATS W/ DRAINAGE PEURAL EFFUSION AND BX'S  10-30-2008    Social History:   reports that she has never smoked. She has never used smokeless tobacco. She reports that she does not drink alcohol or use drugs.  No Known Allergies  Family History  Problem Relation Age of Onset  . Stomach cancer Mother   . Lung disease Father   . Asthma Father      Prior to Admission medications   Medication Sig Start Date End Date Taking? Authorizing Provider  acyclovir (ZOVIRAX) 800 MG tablet Take 1 tablet (800 mg total) by mouth 2 (two) times daily. 04/18/16  Yes Truitt Merle, MD  amoxicillin-clavulanate (AUGMENTIN) 875-125 MG tablet Take 1 tablet by mouth 2 (two) times daily. 05/08/16  Yes Truitt Merle, MD  dexamethasone (DECADRON) 4 MG tablet Take 1 tablet (  4 mg total) by mouth 2 (two) times daily with a meal. 04/09/16  Yes Truitt Merle, MD  entecavir (BARACLUDE) 0.5 MG tablet Take 0.5 mg by mouth daily.   Yes Historical Provider, MD  esomeprazole (NEXIUM) 40 MG capsule Take 1 capsule (40 mg total) by mouth daily at 12 noon. 11/20/15  Yes Truitt Merle, MD  metFORMIN (GLUCOPHAGE) 500 MG tablet Take 1 tablet (500 mg total) by mouth 2 (two) times daily with a meal. 02/27/16  Yes Truitt Merle, MD  montelukast (SINGULAIR) 10 MG tablet Take 1 tablet (10 mg total) by mouth once a week. 04/18/16  Yes Truitt Merle, MD  omeprazole (PRILOSEC) 20 MG capsule Take 1  capsule (20 mg total) by mouth daily. 04/18/16  Yes Truitt Merle, MD  ondansetron (ZOFRAN) 8 MG tablet Take 1 tablet (8 mg total) by mouth every 8 (eight) hours as needed for nausea or vomiting. 02/18/16  Yes Truitt Merle, MD    Physical Exam: Vitals:   05/11/16 1215 05/11/16 1230 05/11/16 1245 05/11/16 1300  BP: 93/63 96/60  (!) 100/59  Pulse: 98 94 (!) 102 (!) 102  Resp:   (!) 21 16  Temp:      TempSrc:      SpO2: 100% 100% (!) 86% 100%      Constitutional: NAD, calm, comfortable Eyes: PERTLA, lids and conjunctivae normal- ENMT: Mucous membranes are moist. Posterior pharynx clear of any exudate or lesions. poor dentition. -  flushed cheeks Neck: normal, supple, no masses, no thyromegaly Respiratory: crackles in b/l lower lobes no wheezing, no crackles. Normal respiratory effort. No accessory muscle use.  Cardiovascular: S1 & S2 heard, regular rate and rhythm, no murmurs / rubs / gallops. No extremity edema. 2+ pedal pulses. No carotid bruits.  Abdomen: No distension, no tenderness, no masses palpated. No hepatosplenomegaly. Bowel sounds normal.  Musculoskeletal: no clubbing / cyanosis. No joint deformity upper and lower extremities. Good ROM, no contractures. Normal muscle tone.  Skin: no rashes, lesions, ulcers. No induration Neurologic: CN 2-12 grossly intact. Sensation intact, DTR normal. Strength 5/5 in all 4 limbs.  Psychiatric: Normal judgment and insight. Alert and oriented x 3. Normal mood.     Labs on Admission: I have personally reviewed following labs and imaging studies  CBC:  Recent Labs Lab 05/08/16 0804 05/11/16 1111  WBC 1.6* 2.9*  NEUTROABS 1.2* 1.9  HGB 11.1* 10.8*  HCT 33.9* 32.8*  MCV 87.1 87.2  PLT 98* 57*   Basic Metabolic Panel:  Recent Labs Lab 05/08/16 0804 05/11/16 1111  NA 142 136  K 4.2 3.8  CL  --  102  CO2 22 26  GLUCOSE 166* 82  BUN 12.5 16  CREATININE 0.6 0.57  CALCIUM 9.1 9.2   GFR: CrCl cannot be calculated (Unknown ideal  weight.). Liver Function Tests:  Recent Labs Lab 05/08/16 0804 05/11/16 1111  AST 45* 49*  ALT 20 18  ALKPHOS 146 124  BILITOT <0.22 0.3  PROT 6.5 6.0*  ALBUMIN 3.3* 3.0*   No results for input(s): LIPASE, AMYLASE in the last 168 hours. No results for input(s): AMMONIA in the last 168 hours. Coagulation Profile:  Recent Labs Lab 05/11/16 1111  INR 0.92   Cardiac Enzymes: No results for input(s): CKTOTAL, CKMB, CKMBINDEX, TROPONINI in the last 168 hours. BNP (last 3 results) No results for input(s): PROBNP in the last 8760 hours. HbA1C: No results for input(s): HGBA1C in the last 72 hours. CBG: No results for input(s): GLUCAP in the last  168 hours. Lipid Profile: No results for input(s): CHOL, HDL, LDLCALC, TRIG, CHOLHDL, LDLDIRECT in the last 72 hours. Thyroid Function Tests: No results for input(s): TSH, T4TOTAL, FREET4, T3FREE, THYROIDAB in the last 72 hours. Anemia Panel: No results for input(s): VITAMINB12, FOLATE, FERRITIN, TIBC, IRON, RETICCTPCT in the last 72 hours. Urine analysis:    Component Value Date/Time   COLORURINE YELLOW 05/11/2016 1111   APPEARANCEUR CLOUDY (A) 05/11/2016 1111   LABSPEC 1.014 05/11/2016 1111   LABSPEC 1.010 04/09/2016 1356   PHURINE 7.0 05/11/2016 1111   GLUCOSEU NEGATIVE 05/11/2016 1111   GLUCOSEU Negative 04/09/2016 1356   HGBUR NEGATIVE 05/11/2016 1111   BILIRUBINUR NEGATIVE 05/11/2016 1111   BILIRUBINUR Negative 04/09/2016 1356   KETONESUR NEGATIVE 05/11/2016 1111   PROTEINUR NEGATIVE 05/11/2016 1111   UROBILINOGEN 0.2 04/09/2016 1356   NITRITE NEGATIVE 05/11/2016 1111   LEUKOCYTESUR NEGATIVE 05/11/2016 1111   LEUKOCYTESUR Moderate 04/09/2016 1356   Sepsis Labs: @LABRCNTIP (procalcitonin:4,lacticidven:4) )No results found for this or any previous visit (from the past 240 hour(s)).   Radiological Exams on Admission: Dg Chest 2 View  Result Date: 05/11/2016 CLINICAL DATA:  Shortness of breath, wheezing EXAM: CHEST  2  VIEW COMPARISON:  05/08/2016 FINDINGS: Mild patchy bilateral lower lobe opacities, atelectasis versus pneumonia. No pleural effusion or pneumothorax. The heart is top-normal in size. Right chest power port terminates at cavoatrial junction. IMPRESSION: Mild patchy bilateral lower lobe opacities, atelectasis versus pneumonia. Electronically Signed   By: Julian Hy M.D.   On: 05/11/2016 11:52    EKG: Independently reviewed. Sinus rhythm  Assessment/Plan Principal Problem:   HCAP (healthcare-associated pneumonia)/ Influenza B - Tamiflu - as she is immune compromised, agree with antibiotics-  Vanc and Cefepime given in ER- change Cefepime to Levaquin- check MRSA PCR to see if Vanc can be stopped - not hypoxic - f/u blood cultures  Active Problems:     Plasma cell leukemia in relapse - currently receiving chemo  Vomiting and diarrhea - Protonix, Zofran QID- hold off on stool studies as is is likely from acute illness    GERD (gastroesophageal reflux disease) - Protonix    Port catheter in place      Pancytopenia - due to chemo? Follow  Hep B - Baraclude mentioned on med list- hold for now due to vomiting   Hold Acyclovir for today.  Replace Decadron with Hydrocortisone  DVT prophylaxis: SCDs  Code Status: Full code  Family Communication:   Disposition Plan: home when stable  Consults called: none- Dr Burr Medico added to treatment team Admission status: inpatient    Debbe Odea MD [facility] Pager: [email or web address] Password [email or web address] 7PM-7AM, please contact night-coverage   05/11/2016, 3:44 PM

## 2016-05-12 ENCOUNTER — Encounter (HOSPITAL_COMMUNITY): Payer: Self-pay

## 2016-05-12 ENCOUNTER — Telehealth: Payer: Self-pay | Admitting: Hematology

## 2016-05-12 DIAGNOSIS — E876 Hypokalemia: Secondary | ICD-10-CM

## 2016-05-12 DIAGNOSIS — R5081 Fever presenting with conditions classified elsewhere: Secondary | ICD-10-CM

## 2016-05-12 DIAGNOSIS — J101 Influenza due to other identified influenza virus with other respiratory manifestations: Secondary | ICD-10-CM

## 2016-05-12 DIAGNOSIS — A419 Sepsis, unspecified organism: Secondary | ICD-10-CM

## 2016-05-12 DIAGNOSIS — D709 Neutropenia, unspecified: Secondary | ICD-10-CM

## 2016-05-12 DIAGNOSIS — J189 Pneumonia, unspecified organism: Secondary | ICD-10-CM

## 2016-05-12 DIAGNOSIS — J111 Influenza due to unidentified influenza virus with other respiratory manifestations: Secondary | ICD-10-CM

## 2016-05-12 DIAGNOSIS — D6481 Anemia due to antineoplastic chemotherapy: Secondary | ICD-10-CM

## 2016-05-12 DIAGNOSIS — D759 Disease of blood and blood-forming organs, unspecified: Secondary | ICD-10-CM

## 2016-05-12 LAB — CBC
HCT: 28.2 % — ABNORMAL LOW (ref 36.0–46.0)
Hemoglobin: 9 g/dL — ABNORMAL LOW (ref 12.0–15.0)
MCH: 27.4 pg (ref 26.0–34.0)
MCHC: 31.9 g/dL (ref 30.0–36.0)
MCV: 85.7 fL (ref 78.0–100.0)
PLATELETS: 37 10*3/uL — AB (ref 150–400)
RBC: 3.29 MIL/uL — AB (ref 3.87–5.11)
RDW: 17.1 % — ABNORMAL HIGH (ref 11.5–15.5)
WBC: 0.8 10*3/uL — AB (ref 4.0–10.5)

## 2016-05-12 LAB — GLUCOSE, CAPILLARY
GLUCOSE-CAPILLARY: 111 mg/dL — AB (ref 65–99)
GLUCOSE-CAPILLARY: 132 mg/dL — AB (ref 65–99)
GLUCOSE-CAPILLARY: 113 mg/dL — AB (ref 65–99)
GLUCOSE-CAPILLARY: 97 mg/dL (ref 65–99)

## 2016-05-12 LAB — BASIC METABOLIC PANEL
ANION GAP: 5 (ref 5–15)
BUN: 8 mg/dL (ref 6–20)
CALCIUM: 7.4 mg/dL — AB (ref 8.9–10.3)
CO2: 23 mmol/L (ref 22–32)
CREATININE: 0.49 mg/dL (ref 0.44–1.00)
Chloride: 112 mmol/L — ABNORMAL HIGH (ref 101–111)
GFR calc Af Amer: >60 mL/min (ref >60)
GLUCOSE: 110 mg/dL — AB (ref 65–99)
Potassium: 3.3 mmol/L — ABNORMAL LOW (ref 3.5–5.1)
Sodium: 140 mmol/L (ref 135–145)

## 2016-05-12 LAB — MULTIPLE MYELOMA PANEL, SERUM
ALBUMIN SERPL ELPH-MCNC: 3 g/dL (ref 2.9–4.4)
ALPHA 1: 0.3 g/dL (ref 0.0–0.4)
Albumin/Glob SerPl: 1.1 (ref 0.7–1.7)
Alpha2 Glob SerPl Elph-Mcnc: 0.9 g/dL (ref 0.4–1.0)
B-Globulin SerPl Elph-Mcnc: 1 g/dL (ref 0.7–1.3)
Gamma Glob SerPl Elph-Mcnc: 0.8 g/dL (ref 0.4–1.8)
Globulin, Total: 3 g/dL (ref 2.2–3.9)
IGA/IMMUNOGLOBULIN A, SERUM: 33 mg/dL — AB (ref 87–352)
IGM (IMMUNOGLOBIN M), SRM: 30 mg/dL (ref 26–217)
IgG, Qn, Serum: 749 mg/dL (ref 700–1600)
M Protein SerPl Elph-Mcnc: 0.1 g/dL — ABNORMAL HIGH
Total Protein: 6 g/dL (ref 6.0–8.5)

## 2016-05-12 MED ORDER — ACYCLOVIR 400 MG PO TABS
800.0000 mg | ORAL_TABLET | Freq: Two times a day (BID) | ORAL | Status: DC
  Administered 2016-05-12 – 2016-05-16 (×9): 800 mg via ORAL
  Filled 2016-05-12 (×9): qty 2

## 2016-05-12 MED ORDER — DEXTROSE 5 % IV SOLN
2.0000 g | Freq: Three times a day (TID) | INTRAVENOUS | Status: DC
  Administered 2016-05-12 – 2016-05-16 (×12): 2 g via INTRAVENOUS
  Filled 2016-05-12 (×15): qty 2

## 2016-05-12 MED ORDER — BOOST / RESOURCE BREEZE PO LIQD
1.0000 | Freq: Three times a day (TID) | ORAL | Status: DC
  Administered 2016-05-12 – 2016-05-15 (×9): 1 via ORAL

## 2016-05-12 MED ORDER — POTASSIUM CHLORIDE CRYS ER 20 MEQ PO TBCR
40.0000 meq | EXTENDED_RELEASE_TABLET | Freq: Once | ORAL | Status: AC
  Administered 2016-05-12: 40 meq via ORAL
  Filled 2016-05-12: qty 2

## 2016-05-12 MED ORDER — TBO-FILGRASTIM 480 MCG/0.8ML ~~LOC~~ SOSY: 480.0000 ug | PREFILLED_SYRINGE | Freq: Once | SUBCUTANEOUS | Status: DC

## 2016-05-12 MED ORDER — TBO-FILGRASTIM 480 MCG/0.8ML ~~LOC~~ SOSY
480.0000 ug | PREFILLED_SYRINGE | Freq: Once | SUBCUTANEOUS | Status: AC
  Administered 2016-05-12: 480 ug via SUBCUTANEOUS
  Filled 2016-05-12: qty 0.8

## 2016-05-12 NOTE — Progress Notes (Signed)
CRITICAL VALUE ALERT  Critical value received: WBC  Date of notification: 05/12/2016  Time of notification:  0556  Critical value read back:yes  Nurse who received alert:  Tommie Raymond  MD notified (1st page):  K Schorr  Time of first page:  0600  MD notified (2nd page):  Time of second page:  Responding MD:   Time MD responded:

## 2016-05-12 NOTE — Progress Notes (Signed)
Sheryl [name]   DOB:06-15-1970   HQ#:[id number]   [id number]  Oncology follow up note   Subjective: Patient is well-known to me, under my care for her relapsed acute plasma cell leukemia. She is on chemo with CyBorD and Daratumumab. Last treatment on 05/08/2016 (cytoxan was held dur to fever the day before). She was admitted yesterday for fever and cough. CXR showed probable Pneumonia. She feels better today, her daughter is at the bedside, and served as her interpreter.   Objective:  Vitals:   05/12/16 0542 05/12/16 1238  BP: 104/70 111/75  Pulse: 77 69  Resp: 14 14  Temp: 98.2 F (36.8 C) 98.1 F (36.7 C)    Body mass index is 22.37 kg/m.  Intake/Output Summary (Last 24 hours) at 05/12/16 2037 Last data filed at 05/12/16 1600  Gross per 24 hour  Intake             1280 ml  Output             1000 ml  Net              280 ml     Sclerae unicteric  Oropharynx clear  No peripheral adenopathy  Lungs clear -- no rales or rhonchi  Heart regular rate and rhythm  Abdomen benign  MSK no focal spinal tenderness, no peripheral edema  Neuro nonfocal   CBG (last 3)   Recent Labs  05/12/16 0736 05/12/16 1233 05/12/16 1735  GLUCAP 111* 132* 113*     Labs:  Lab Results  Component Value Date   WBC 0.8 (LL) 05/12/2016   HGB 9.0 (L) 05/12/2016   HCT 28.2 (L) 05/12/2016   MCV 85.7 05/12/2016   PLT 37 (L) 05/12/2016   NEUTROABS 1.5 (L) 05/11/2016   CMP Latest Ref Rng & Units 05/12/2016 05/11/2016 05/08/2016  Glucose 65 - 99 mg/dL 110(H) 82 166(H)  BUN 6 - 20 mg/dL 8 16 12.5  Creatinine 0.44 - 1.00 mg/dL 0.49 0.57 0.6  Sodium 135 - 145 mmol/L 140 136 142  Potassium 3.5 - 5.1 mmol/L 3.3(L) 3.8 4.2  Chloride 101 - 111 mmol/L 112(H) 102 -  CO2 22 - 32 mmol/L 23 26 22   Calcium 8.9 - 10.3 mg/dL 7.4(L) 9.2 9.1  Total Protein 6.5 - 8.1 g/dL - 6.0(L) 6.5  Total Bilirubin 0.3 - 1.2 mg/dL - 0.3 <0.22  Alkaline Phos 38 - 126 U/L - 124 146  AST 15 - 41 U/L - 49(H) 45(H)  ALT 14 - 54 U/L -  18 20     Urine Studies No results for input(s): UHGB, CRYS in the last 72 hours.  Invalid input(s): UACOL, UAPR, USPG, UPH, UTP, UGL, UKET, UBIL, UNIT, UROB, ULEU, UEPI, UWBC, URBC, UBAC, CAST, [place], [place]  Basic Metabolic Panel:  Recent Labs Lab 05/08/16 0804 05/11/16 1111 05/12/16 0524  NA 142 136 140  K 4.2 3.8 3.3*  CL  --  102 112*  CO2 22 26 23   GLUCOSE 166* 82 110*  BUN 12.5 16 8   CREATININE 0.6 0.57 0.49  CALCIUM 9.1 9.2 7.4*   GFR Estimated Creatinine Clearance: 68.8 mL/min (by C-G formula based on SCr of 0.49 mg/dL). Liver Function Tests:  Recent Labs Lab 05/08/16 0804 05/11/16 1111  AST 45* 49*  ALT 20 18  ALKPHOS 146 124  BILITOT <0.22 0.3  PROT 6.5  6.0 6.0*  ALBUMIN 3.3* 3.0*   No results for input(s): LIPASE, AMYLASE in the last 168 hours. No results for input(s):  AMMONIA in the last 168 hours. Coagulation profile  Recent Labs Lab 05/11/16 1111  INR 0.92    CBC:  Recent Labs Lab 05/08/16 0804 05/11/16 1111 05/11/16 1500 05/12/16 0524  WBC 1.6* 2.9* 2.2* 0.8*  NEUTROABS 1.2* 1.9 1.5*  --   HGB 11.1* 10.8* 9.6* 9.0*  HCT 33.9* 32.8* 29.7* 28.2*  MCV 87.1 87.2 87.4 85.7  PLT 98* 57* 45* 37*   Cardiac Enzymes: No results for input(s): CKTOTAL, CKMB, CKMBINDEX, TROPONINI in the last 168 hours. BNP: Invalid input(s): POCBNP CBG:  Recent Labs Lab 05/11/16 1727 05/11/16 2153 05/12/16 0736 05/12/16 1233 05/12/16 1735  GLUCAP 73 129* 111* 132* 113*   D-Dimer No results for input(s): DDIMER in the last 72 hours. Hgb A1c No results for input(s): HGBA1C in the last 72 hours. Lipid Profile No results for input(s): CHOL, HDL, LDLCALC, TRIG, CHOLHDL, LDLDIRECT in the last 72 hours. Thyroid function studies No results for input(s): TSH, T4TOTAL, T3FREE, THYROIDAB in the last 72 hours.  Invalid input(s): FREET3 Anemia work up No results for input(s): VITAMINB12, FOLATE, FERRITIN, TIBC, IRON, RETICCTPCT in the last 72  hours. Microbiology Recent Results (from the past 240 hour(s))  MRSA PCR Screening     Status: None   Collection Time: 05/11/16  3:55 PM  Result Value Ref Range Status   MRSA by PCR NEGATIVE NEGATIVE Final    Comment:        The GeneXpert MRSA Assay (FDA approved for NASAL specimens only), is one component of a comprehensive MRSA colonization surveillance program. It is not intended to diagnose MRSA infection nor to guide or monitor treatment for MRSA infections.       Studies:  Dg Chest 2 View  Result Date: 05/11/2016 CLINICAL DATA:  Shortness of breath, wheezing EXAM: CHEST  2 VIEW COMPARISON:  05/08/2016 FINDINGS: Mild patchy bilateral lower lobe opacities, atelectasis versus pneumonia. No pleural effusion or pneumothorax. The heart is top-normal in size. Right chest power port terminates at cavoatrial junction. IMPRESSION: Mild patchy bilateral lower lobe opacities, atelectasis versus pneumonia. Electronically Signed   By: Julian Hy M.D.   On: 05/11/2016 11:52    Assessment: 46 y.o. with relapsed acute plasma cell leukemia, on chemotherapy, was admitted for pneumonia and pancytopenia  1. Sepsis secondary to influenza pneumonia and HCAP 2. Febrile neutropenia  3. Anemia and some cytopenia secondary to chemotherapy 4. Relapsed acute plasma cell leukemia 5. Hypokalemia   Plan:  -I agree with antibiotics, she is on Tamiflu and Cefepime now  -she has started granix 417mcg, please continue daily until ANC>1.5 -Will hold her chemo for now, I have canceled her treatment appointment this week -will follow up as need when she is in the hospital. I plan to see her back in a week after hospital discharge  -she is full code now.  -Appreciate the excellent care from hospitalist team.   Truitt Merle  05/12/2016    Truitt Merle, MD 05/12/2016  8:37 PM

## 2016-05-12 NOTE — Progress Notes (Signed)
Initial Nutrition Assessment  INTERVENTION:   Provide Boost Breeze po TID, each supplement provides 250 kcal and 9 grams of protein RD to continue to monitor for needs  NUTRITION DIAGNOSIS:   Increased nutrient needs related to cancer and cancer related treatments as evidenced by estimated needs.  GOAL:   Patient will meet greater than or equal to 90% of their needs  MONITOR:   PO intake, Supplement acceptance, Labs, Weight trends, I & O's  REASON FOR ASSESSMENT:   Malnutrition Screening Tool    ASSESSMENT:   46 y.o. 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  Family member is translating over the phone.  She also has a runny nose.   Patient non-english speaking. Pt with visitors in room now. Per chart review, pt has had poor appetite recently. Pt has been receiving chemotherapy for leukemia. Has had issues with constipation per chart. Now with fever, SOB and cough from flu. On 3/6, patient developed vomiting and diarrhea.  Will order Boost Breeze to provide additional kcal and protein to diet.   Per chart review, pt has lost 5 lb since 2/26 (4% wt loss x 1.5 months, insignificant for time frame). Unable to perform NFPE at this time.  Medications: IV Zofran every 6 hours, IV Protonix daily Labs reviewed: CBGs: 111-129 Low K  Diet Order:  Diet regular Room service appropriate? Yes; Fluid consistency: Thin  Skin:  Reviewed, no issues  Last BM:  PTA  Height:   Ht Readings from Last 1 Encounters:  05/11/16 5\' 2"  (1.575 m)    Weight:   Wt Readings from Last 1 Encounters:  05/11/16 122 lb 4.8 oz (55.5 kg)    Ideal Body Weight:  50 kg  BMI:  Body mass index is 22.37 kg/m.  Estimated Nutritional Needs:   Kcal:  1700-1900  Protein:  75-85g  Fluid:  1.9L/day  EDUCATION NEEDS:   No education needs identified at this time  Clayton Bibles, MS, RD, LDN Pager: [phone] After Hours Pager: [phone]

## 2016-05-12 NOTE — Progress Notes (Addendum)
Pharmacy Antibiotic Note  Sheryl [name] is a 46 y.o. female admitted on 05/11/2016 with acute plasma cell leukemia presenting on 05/11/2016 with fever, SOB, and cough. Unable to complete chemo on 4/5 and developed worsening symptoms over the weekend. Flu panel positive for Influenza B and CXR suggestive of PNA. Pharmacy consulted to dose acyclovir for VZV ppx and cefepime for FN.  Plan: Acyclovir 800mg  po twice daily. Cefepime 2gm IV q8h Discontinue levaquin per MD Follow renal function   Height: 5\' 2"  (157.5 cm) Weight: 122 lb 4.8 oz (55.5 kg) IBW/kg (Calculated) : 50.1  Temp (24hrs), Avg:99.7 F (37.6 C), Min:98.2 F (36.8 C), Max:101.7 F (38.7 C)   Recent Labs Lab 05/08/16 0804 05/11/16 1111 05/11/16 1140 05/11/16 1500 05/11/16 1512 05/12/16 0524  WBC 1.6* 2.9*  --  2.2*  --  0.8*  CREATININE 0.6 0.57  --   --   --  0.49  LATICACIDVEN  --   --  0.79  --  0.68  --     Estimated Creatinine Clearance: 68.8 mL/min (by C-G formula based on SCr of 0.49 mg/dL).    No Known Allergies  Antimicrobials this admission: Cefepime 4/8 >> Vancomycin 4/8 >> 4/9 Levaquin 4/8 >> 4/9 Tamiflu 4/8 >> Acyclovir 4/9 >>   Microbiology results: 4/8 BCx: sent  4/8 MRSA PCR: negative  Thank you for allowing pharmacy to be a part of this patient's care.  Dolly Rias RPh 05/12/2016, 10:38 AM Pager [phone]

## 2016-05-12 NOTE — Progress Notes (Addendum)
Patient ID: Sheryl [name], female   DOB: July 14, 1970, 46 y.o.   MRN: [id number]  PROGRESS NOTE    Casara Perrier  QHU:[id number] DOB: 01-30-1971 DOA: 05/11/2016  PCP: [name], MD   Brief Narrative:  46 y.o. 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  She also had 5 episodes of vomiting the day PTA and about 3 episodes of diarrhea. She was febrile on the admission, Tmax 101.51F, HR in 100's. Chest x-ray showed bilateral infiltrates. Influenza PCR was positive. She was started on broad-spectrum antibiotics including Tamiflu for influenza.   Assessment & Plan:   Sepsis secondary to influenza pneumonia and HCAP - Sepsis criteria met on admission with fever, tachycardia, tachypnea, hypotension and hypoxia - Blood work was notable for leukopenia and currently neutropenia - Lactic acid is within normal limits - Source of infection likely influenza pneumonia as influenza PCR positive - In addition, chest x-ray showed lower lobe infiltrates concerning for pneumonia - Patient was on broad-spectrum antibiotics, cefepime and vancomycin but because no evidence of MRSA infection was stopped Vanco today. Continue cefepime. Also, stop Levaquin.  Febrile neutropenia - As noted above, stopped vancomycin and we will continue cefepime - Placed order for Neupogen - Appreciate oncology recommendations  Anemia of chronic disease - Likely related to malignancy and sequela of chemotherapy - Hemoglobin stable at 9  Thrombocytopenia - Related to sequela of chemotherapy - Continue to monitor platelet count daily  Hypokalemia  - From sepsis  - Supplemented   Plasma cell leukemia in relapse - Consulted Dr. Burr Medico     DVT prophylaxis: SCDs bilaterally Code Status: full code  Family Communication: family at the bedside this am Disposition Plan: not  yet stable for discharge, she has febrile neutropenia   Consultants:   Oncology, Dr. Burr Medico   Procedures:   None   Antimicrobials:   Cefepime 05/11/2016 -->  Entecavir  Levaquin 05/11/2016 --> 05/12/2016  Tamiflu 05/11/2016 -->  Vanco 05/11/2016 --> 05/12/2016    Subjective: No overnight events.   Objective: Vitals:   05/11/16 1300 05/11/16 1625 05/11/16 2200 05/12/16 0542  BP: (!) 100/59 (!) 90/51 (!) 91/58 104/70  Pulse: (!) 102 86 82 77  Resp: '16 18 16 14  ' Temp:  98.8 F (37.1 C) 99.9 F (37.7 C) 98.2 F (36.8 C)  TempSrc:  Oral Oral Oral  SpO2: 100% 100% 99% 100%  Weight:  55.5 kg (122 lb 4.8 oz)    Height:  '5\' 2"'  (1.575 m)      Intake/Output Summary (Last 24 hours) at 05/12/16 1029 Last data filed at 05/11/16 2248  Gross per 24 hour  Intake             2240 ml  Output                0 ml  Net             2240 ml   Filed Weights   05/11/16 1625  Weight: 55.5 kg (122 lb 4.8 oz)    Examination:  General exam: Appears calm and comfortable  Respiratory system: Clear to auscultation. Respiratory effort normal. Cardiovascular system: S1 & S2 heard, RRR. No JVD, murmurs, rubs, gallops or clicks. No pedal edema. Gastrointestinal system: Abdomen is nondistended, soft and nontender. No organomegaly or masses felt. Normal bowel sounds heard. Central  nervous system: Alert and oriented. No focal neurological deficits. Extremities: Symmetric 5 x 5 power. Skin: No rashes, lesions or ulcers Psychiatry: Judgement and insight appear normal. Mood & affect appropriate.   Data Reviewed: I have personally reviewed following labs and imaging studies  CBC:  Recent Labs Lab 05/08/16 0804 05/11/16 1111 05/11/16 1500 05/12/16 0524  WBC 1.6* 2.9* 2.2* 0.8*  NEUTROABS 1.2* 1.9 1.5*  --   HGB 11.1* 10.8* 9.6* 9.0*  HCT 33.9* 32.8* 29.7* 28.2*  MCV 87.1 87.2 87.4 85.7  PLT 98* 57* 45* 37*   Basic Metabolic Panel:  Recent Labs Lab 05/08/16 0804 05/11/16 1111  05/12/16 0524  NA 142 136 140  K 4.2 3.8 3.3*  CL  --  102 112*  CO2 '22 26 23  ' GLUCOSE 166* 82 110*  BUN 12.'5 16 8  ' CREATININE 0.6 0.57 0.49  CALCIUM 9.1 9.2 7.4*   GFR: Estimated Creatinine Clearance: 68.8 mL/min (by C-G formula based on SCr of 0.49 mg/dL). Liver Function Tests:  Recent Labs Lab 05/08/16 0804 05/11/16 1111  AST 45* 49*  ALT 20 18  ALKPHOS 146 124  BILITOT <0.22 0.3  PROT 6.5 6.0*  ALBUMIN 3.3* 3.0*   No results for input(s): LIPASE, AMYLASE in the last 168 hours. No results for input(s): AMMONIA in the last 168 hours. Coagulation Profile:  Recent Labs Lab 05/11/16 1111  INR 0.92   Cardiac Enzymes: No results for input(s): CKTOTAL, CKMB, CKMBINDEX, TROPONINI in the last 168 hours. BNP (last 3 results) No results for input(s): PROBNP in the last 8760 hours. HbA1C: No results for input(s): HGBA1C in the last 72 hours. CBG:  Recent Labs Lab 05/11/16 1727 05/11/16 2153 05/12/16 0736  GLUCAP 73 129* 111*   Lipid Profile: No results for input(s): CHOL, HDL, LDLCALC, TRIG, CHOLHDL, LDLDIRECT in the last 72 hours. Thyroid Function Tests: No results for input(s): TSH, T4TOTAL, FREET4, T3FREE, THYROIDAB in the last 72 hours. Anemia Panel: No results for input(s): VITAMINB12, FOLATE, FERRITIN, TIBC, IRON, RETICCTPCT in the last 72 hours. Urine analysis:    Component Value Date/Time   COLORURINE YELLOW 05/11/2016 1111   APPEARANCEUR CLOUDY (A) 05/11/2016 1111   LABSPEC 1.014 05/11/2016 1111   LABSPEC 1.010 04/09/2016 1356   PHURINE 7.0 05/11/2016 1111   GLUCOSEU NEGATIVE 05/11/2016 1111   GLUCOSEU Negative 04/09/2016 1356   HGBUR NEGATIVE 05/11/2016 1111   BILIRUBINUR NEGATIVE 05/11/2016 1111   BILIRUBINUR Negative 04/09/2016 1356   KETONESUR NEGATIVE 05/11/2016 1111   PROTEINUR NEGATIVE 05/11/2016 1111   UROBILINOGEN 0.2 04/09/2016 1356   NITRITE NEGATIVE 05/11/2016 1111   LEUKOCYTESUR NEGATIVE 05/11/2016 1111   LEUKOCYTESUR Moderate  04/09/2016 1356   Sepsis Labs: '@[email or web address]' (procalcitonin:4,lacticidven:4)   Recent Results (from the past 240 hour(s))  MRSA PCR Screening     Status: None   Collection Time: 05/11/16  3:55 PM  Result Value Ref Range Status   MRSA by PCR NEGATIVE NEGATIVE Final      Radiology Studies: Dg Chest 2 View Result Date: 05/11/2016 Mild patchy bilateral lower lobe opacities, atelectasis versus pneumonia.   Dg Chest 2 View Result Date: 05/08/2016 No acute disease.    Scheduled Meds: . entecavir  0.5 mg Oral Daily  . hydrocortisone sod succinate (SOLU-CORTEF) inj  100 mg Intravenous Q8H  . insulin aspart  0-5 Units Subcutaneous QHS  . insulin aspart  0-9 Units Subcutaneous TID WC  . levofloxacin (LEVAQUIN) IV  750 mg Intravenous Q24H  . ondansetron (ZOFRAN) IV  4 mg Intravenous  Q6H  . oseltamivir  75 mg Oral BID  . pantoprazole (PROTONIX) IV  40 mg Intravenous Q24H   Continuous Infusions: . sodium chloride 125 mL/hr at 05/11/16 1542  . sodium chloride 125 mL/hr at 05/12/16 0947     LOS: 1 day    Time spent: 25 minutes  Greater than 50% of the time spent on counseling and coordinating the care.   Leisa Lenz, MD [facility] Pager [phone]  If 7PM-7AM, please contact night-coverage [email or web address] Password [email or web address] 05/12/2016, 10:29 AM

## 2016-05-12 NOTE — Telephone Encounter (Signed)
This week's appointments cancelled per Dr Burr Medico via staff msg. 05/12/16

## 2016-05-13 DIAGNOSIS — T451X5A Adverse effect of antineoplastic and immunosuppressive drugs, initial encounter: Secondary | ICD-10-CM

## 2016-05-13 DIAGNOSIS — D701 Agranulocytosis secondary to cancer chemotherapy: Secondary | ICD-10-CM

## 2016-05-13 LAB — GLUCOSE, CAPILLARY
GLUCOSE-CAPILLARY: 93 mg/dL (ref 65–99)
GLUCOSE-CAPILLARY: 80 mg/dL (ref 65–99)
GLUCOSE-CAPILLARY: 93 mg/dL (ref 65–99)
Glucose-Capillary: 71 mg/dL (ref 65–99)
Glucose-Capillary: 81 mg/dL (ref 65–99)

## 2016-05-13 LAB — BASIC METABOLIC PANEL
Anion gap: 5 (ref 5–15)
BUN: 8 mg/dL (ref 6–20)
CALCIUM: 7.8 mg/dL — AB (ref 8.9–10.3)
CHLORIDE: 112 mmol/L — AB (ref 101–111)
CO2: 22 mmol/L (ref 22–32)
CREATININE: 0.65 mg/dL (ref 0.44–1.00)
GFR calc non Af Amer: >60 mL/min (ref >60)
Glucose, Bld: 91 mg/dL (ref 65–99)
Potassium: 3.2 mmol/L — ABNORMAL LOW (ref 3.5–5.1)
SODIUM: 139 mmol/L (ref 135–145)

## 2016-05-13 LAB — CBC
HCT: 29.1 % — ABNORMAL LOW (ref 36.0–46.0)
HEMOGLOBIN: 9.3 g/dL — AB (ref 12.0–15.0)
MCH: 27.3 pg (ref 26.0–34.0)
MCHC: 32 g/dL (ref 30.0–36.0)
MCV: 85.3 fL (ref 78.0–100.0)
Platelets: 45 10*3/uL — ABNORMAL LOW (ref 150–400)
RBC: 3.41 MIL/uL — ABNORMAL LOW (ref 3.87–5.11)
RDW: 17.3 % — AB (ref 11.5–15.5)
WBC: 13.1 10*3/uL — ABNORMAL HIGH (ref 4.0–10.5)

## 2016-05-13 LAB — HIV ANTIBODY (ROUTINE TESTING W REFLEX): HIV SCREEN 4TH GENERATION: NONREACTIVE

## 2016-05-13 MED ORDER — PROMETHAZINE HCL 25 MG/ML IJ SOLN
12.5000 mg | Freq: Once | INTRAMUSCULAR | Status: AC
  Administered 2016-05-13: 12.5 mg via INTRAVENOUS
  Filled 2016-05-13: qty 1

## 2016-05-13 MED ORDER — POTASSIUM CHLORIDE CRYS ER 20 MEQ PO TBCR
40.0000 meq | EXTENDED_RELEASE_TABLET | Freq: Once | ORAL | Status: AC
  Administered 2016-05-13: 40 meq via ORAL
  Filled 2016-05-13: qty 2

## 2016-05-13 MED ORDER — HYDROCODONE-ACETAMINOPHEN 5-325 MG PO TABS
1.0000 | ORAL_TABLET | ORAL | Status: DC | PRN
  Administered 2016-05-13: 1 via ORAL
  Filled 2016-05-13: qty 1

## 2016-05-13 MED ORDER — PANTOPRAZOLE SODIUM 40 MG PO TBEC
40.0000 mg | DELAYED_RELEASE_TABLET | Freq: Every day | ORAL | Status: DC
  Administered 2016-05-13 – 2016-05-15 (×3): 40 mg via ORAL
  Filled 2016-05-13 (×3): qty 1

## 2016-05-13 MED ORDER — ORAL CARE MOUTH RINSE
15.0000 mL | Freq: Two times a day (BID) | OROMUCOSAL | Status: DC
  Administered 2016-05-13 – 2016-05-14 (×3): 15 mL via OROMUCOSAL

## 2016-05-13 NOTE — Progress Notes (Signed)
PHARMACIST - PHYSICIAN COMMUNICATION   CONCERNING: IV to Oral Route Change Policy  RECOMMENDATION: This patient is receiving protonix by the intravenous route.  Based on criteria approved by the Pharmacy and Therapeutics Committee, the intravenous medication(s) is/are being converted to the equivalent oral dose form(s).   DESCRIPTION: These criteria include:  The patient is eating (either orally or via tube) and/or has been taking other orally administered medications for a least 24 hours  The patient has no evidence of active gastrointestinal bleeding or impaired GI absorption (gastrectomy, short bowel, patient on TNA or NPO).  If you have questions about this conversion, please contact the Pharmacy Department  []   [phone] )  [facility] []   [phone] )  [facility] []   [phone] )  [facility] []   [phone] )  [facility] [x]   [phone] )  [place] 05/13/2016, 8:34 AM Pager [phone]

## 2016-05-13 NOTE — Progress Notes (Addendum)
Patient ID: Sheryl [name], female   DOB: Nov 25, 1970, 46 y.o.   MRN: [id number]  PROGRESS NOTE    Sheryl [name]  OEU:[id number] DOB: Sep 20, 1970 DOA: 05/11/2016  PCP: [name], MD   Brief Narrative:  46 y.o. 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  She also had 5 episodes of vomiting the day PTA and about 3 episodes of diarrhea. She was febrile on the admission, Tmax 101.19F, HR in 100's. Chest x-ray showed bilateral infiltrates. Influenza PCR was positive. She was started on broad-spectrum antibiotics including Tamiflu for influenza.   Assessment & Plan:   Sepsis secondary to influenza pneumonia and HCAP - Sepsis criteria met on admission with fever, tachycardia, tachypnea, hypotension and hypoxia - Blood work was notable for leukopenia and currently neutropenia - Lactic acid within normal limits - Source of infection likely influenza pneumonia as influenza PCR positive - In addition, chest x-ray showed lower lobe infiltrates concerning for pneumonia - Patient was on broad-spectrum antibiotics, cefepime and vancomycin but because no evidence of MRSA infection stopped Vanco 4/9. - Continue cefepime   Febrile neutropenia / Chemotherapy induced neutropenia  - Placed order for Neupogen 4/9 - WBC recovered  - Appreciate oncology following   Anemia of chronic disease - Likely related to malignancy and sequela of chemotherapy - Hemoglobin stable at 9  Thrombocytopenia - Related to sequela of chemotherapy - Follow up CBC in am  Hypokalemia  - From sepsis  - Continue to supplement   Plasma cell leukemia in relapse - Appreciate oncology seeing the pt in consultation     DVT prophylaxis: SCDs bilaterally Code Status: full code  Family Communication: family at the bedside this am Disposition Plan: not yet stable for  discharge   Consultants:   Oncology, Dr. Burr Medico   Procedures:   None   Antimicrobials:   Cefepime 05/11/2016 -->  Entecavir  Levaquin 05/11/2016 --> 05/12/2016  Tamiflu 05/11/2016 -->  Vanco 05/11/2016 --> 05/12/2016    Subjective: No overnight events.   Objective: Vitals:   05/12/16 1238 05/12/16 2243 05/13/16 0556 05/13/16 0805  BP: 1'11/75 98/60 98/60 '   Pulse: 69 74 98   Resp: '14 16 20   ' Temp: 98.1 F (36.7 C) 97.9 F (36.6 C) (!) 102 F (38.9 C) 99 F (37.2 C)  TempSrc: Oral Oral Oral   SpO2: 100% 98% 95%   Weight:      Height:        Intake/Output Summary (Last 24 hours) at 05/13/16 1132 Last data filed at 05/13/16 1030  Gross per 24 hour  Intake           2287.5 ml  Output             1600 ml  Net            687.5 ml   Filed Weights   05/11/16 1625  Weight: 55.5 kg (122 lb 4.8 oz)    Examination:  General exam: Appears calm and comfortable, no distress   Respiratory system: No wheezing , no rhonchi  Cardiovascular system: S1 & S2 heard, Rate controlled  Gastrointestinal system: (+) BS, non tender. Central nervous system: No focal neurological deficits. Extremities: No swelling, palpable pulses Skin: No rashes, lesions or ulcers, warm and dry  Psychiatry: Mood & affect appropriate.   Data Reviewed: I have  personally reviewed following labs and imaging studies  CBC:  Recent Labs Lab 05/08/16 0804 05/11/16 1111 05/11/16 1500 05/12/16 0524 05/13/16 0618  WBC 1.6* 2.9* 2.2* 0.8* 13.1*  NEUTROABS 1.2* 1.9 1.5*  --   --   HGB 11.1* 10.8* 9.6* 9.0* 9.3*  HCT 33.9* 32.8* 29.7* 28.2* 29.1*  MCV 87.1 87.2 87.4 85.7 85.3  PLT 98* 57* 45* 37* 45*   Basic Metabolic Panel:  Recent Labs Lab 05/08/16 0804 05/11/16 1111 05/12/16 0524 05/13/16 0618  NA 142 136 140 139  K 4.2 3.8 3.3* 3.2*  CL  --  102 112* 112*  CO2 '22 26 23 22  ' GLUCOSE 166* 82 110* 91  BUN 12.'5 16 8 8  ' CREATININE 0.6 0.57 0.49 0.65  CALCIUM 9.1 9.2 7.4* 7.8*   GFR: Estimated  Creatinine Clearance: 68.8 mL/min (by C-G formula based on SCr of 0.65 mg/dL). Liver Function Tests:  Recent Labs Lab 05/08/16 0804 05/11/16 1111  AST 45* 49*  ALT 20 18  ALKPHOS 146 124  BILITOT <0.22 0.3  PROT 6.5  6.0 6.0*  ALBUMIN 3.3* 3.0*   No results for input(s): LIPASE, AMYLASE in the last 168 hours. No results for input(s): AMMONIA in the last 168 hours. Coagulation Profile:  Recent Labs Lab 05/11/16 1111  INR 0.92   Cardiac Enzymes: No results for input(s): CKTOTAL, CKMB, CKMBINDEX, TROPONINI in the last 168 hours. BNP (last 3 results) No results for input(s): PROBNP in the last 8760 hours. HbA1C: No results for input(s): HGBA1C in the last 72 hours. CBG:  Recent Labs Lab 05/12/16 0736 05/12/16 1233 05/12/16 1735 05/12/16 2307 05/13/16 0820  GLUCAP 111* 132* 113* 97 93   Lipid Profile: No results for input(s): CHOL, HDL, LDLCALC, TRIG, CHOLHDL, LDLDIRECT in the last 72 hours. Thyroid Function Tests: No results for input(s): TSH, T4TOTAL, FREET4, T3FREE, THYROIDAB in the last 72 hours. Anemia Panel: No results for input(s): VITAMINB12, FOLATE, FERRITIN, TIBC, IRON, RETICCTPCT in the last 72 hours. Urine analysis:    Component Value Date/Time   COLORURINE YELLOW 05/11/2016 1111   APPEARANCEUR CLOUDY (A) 05/11/2016 1111   LABSPEC 1.014 05/11/2016 1111   LABSPEC 1.010 04/09/2016 1356   PHURINE 7.0 05/11/2016 1111   GLUCOSEU NEGATIVE 05/11/2016 1111   GLUCOSEU Negative 04/09/2016 1356   HGBUR NEGATIVE 05/11/2016 1111   BILIRUBINUR NEGATIVE 05/11/2016 1111   BILIRUBINUR Negative 04/09/2016 1356   KETONESUR NEGATIVE 05/11/2016 1111   PROTEINUR NEGATIVE 05/11/2016 1111   UROBILINOGEN 0.2 04/09/2016 1356   NITRITE NEGATIVE 05/11/2016 1111   LEUKOCYTESUR NEGATIVE 05/11/2016 1111   LEUKOCYTESUR Moderate 04/09/2016 1356   Sepsis Labs: '@[email or web address]' (procalcitonin:4,lacticidven:4)   Recent Results (from the past 240 hour(s))  MRSA PCR Screening      Status: None   Collection Time: 05/11/16  3:55 PM  Result Value Ref Range Status   MRSA by PCR NEGATIVE NEGATIVE Final      Radiology Studies: Dg Chest 2 View Result Date: 05/11/2016 Mild patchy bilateral lower lobe opacities, atelectasis versus pneumonia.   Dg Chest 2 View Result Date: 05/08/2016 No acute disease.    Scheduled Meds: . acyclovir  800 mg Oral BID  . ceFEPime (MAXIPIME) IV  2 g Intravenous Q8H  . entecavir  0.5 mg Oral Daily  . feeding supplement  1 Container Oral TID BM  . insulin aspart  0-5 Units Subcutaneous QHS  . insulin aspart  0-9 Units Subcutaneous TID WC  . ondansetron (ZOFRAN) IV  4 mg Intravenous Q6H  .  oseltamivir  75 mg Oral BID  . pantoprazole  40 mg Oral QHS  . potassium chloride  40 mEq Oral Once   Continuous Infusions: . sodium chloride 75 mL/hr at 05/13/16 0533     LOS: 2 days    Time spent: 15 minutes  Greater than 50% of the time spent on counseling and coordinating the care.   Leisa Lenz, MD [facility] Pager [phone]  If 7PM-7AM, please contact night-coverage [email or web address] Password [facility] 05/13/2016, 11:32 AM

## 2016-05-14 LAB — GLUCOSE, CAPILLARY
GLUCOSE-CAPILLARY: 82 mg/dL (ref 65–99)
GLUCOSE-CAPILLARY: 127 mg/dL — AB (ref 65–99)
GLUCOSE-CAPILLARY: 108 mg/dL — AB (ref 65–99)
Glucose-Capillary: 122 mg/dL — ABNORMAL HIGH (ref 65–99)

## 2016-05-14 LAB — CBC
HCT: 30 % — ABNORMAL LOW (ref 36.0–46.0)
HEMOGLOBIN: 9.6 g/dL — AB (ref 12.0–15.0)
MCH: 27.5 pg (ref 26.0–34.0)
MCHC: 32 g/dL (ref 30.0–36.0)
MCV: 86 fL (ref 78.0–100.0)
PLATELETS: 42 10*3/uL — AB (ref 150–400)
RBC: 3.49 MIL/uL — AB (ref 3.87–5.11)
RDW: 17.6 % — ABNORMAL HIGH (ref 11.5–15.5)
WBC: 13.7 10*3/uL — AB (ref 4.0–10.5)

## 2016-05-14 LAB — BASIC METABOLIC PANEL
ANION GAP: 3 — AB (ref 5–15)
BUN: 7 mg/dL (ref 6–20)
CHLORIDE: 110 mmol/L (ref 101–111)
CO2: 24 mmol/L (ref 22–32)
Calcium: 7.9 mg/dL — ABNORMAL LOW (ref 8.9–10.3)
Creatinine, Ser: 0.54 mg/dL (ref 0.44–1.00)
Glucose, Bld: 85 mg/dL (ref 65–99)
POTASSIUM: 3.5 mmol/L (ref 3.5–5.1)
SODIUM: 137 mmol/L (ref 135–145)

## 2016-05-14 MED ORDER — MAGIC MOUTHWASH W/LIDOCAINE
15.0000 mL | Freq: Four times a day (QID) | ORAL | Status: DC | PRN
  Administered 2016-05-14 – 2016-05-15 (×2): 15 mL via ORAL
  Filled 2016-05-14 (×3): qty 15

## 2016-05-14 MED ORDER — GUAIFENESIN-DM 100-10 MG/5ML PO SYRP: 5.0000 mL | ORAL_SOLUTION | ORAL | Status: DC | PRN

## 2016-05-14 NOTE — Progress Notes (Signed)
Patient ID: Sheryl [name], female   DOB: 1970/11/25, 46 y.o.   MRN: [id number]  PROGRESS NOTE    Sheryl [name]  LTR:[id number] DOB: May 11, 1970 DOA: 05/11/2016  PCP: [name], MD   Brief Narrative:  46 y.o. 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  She also had 5 episodes of vomiting the day PTA and about 3 episodes of diarrhea. She was febrile on the admission, Tmax 101.22F, HR in 100's. Chest x-ray showed bilateral infiltrates. Influenza PCR was positive. She was started on broad-spectrum antibiotics including Tamiflu for influenza.   Assessment & Plan:   Sepsis secondary to influenza pneumonia and HCAP - Sepsis criteria met on admission with fever, tachycardia, tachypnea, hypotension and hypoxia - Lactic acid within normal limits - Source of infection likely influenza pneumonia as influenza PCR positive - In addition, chest x-ray showed lower lobe infiltrates concerning for pneumonia - Patient was on broad-spectrum antibiotics, cefepime and vancomycin but because no evidence of MRSA infection stopped Vanco 4/9. 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 Burr Medico   Procedures:   None   Antimicrobials:   Cefepime 05/11/2016  -->  Entecavir  Levaquin 05/11/2016 --> 05/12/2016  Tamiflu 05/11/2016 -->  Vanco 05/11/2016 --> 05/12/2016    Subjective: No overnight events. Complains of cough.  Objective: Vitals:   05/13/16 0805 05/13/16 1446 05/13/16 2108 05/14/16 0520  BP:  100/67 107/72 (!) 96/58  Pulse:  81 93 83  Resp:  '20 16 20  ' Temp: 99 F (37.2 C) 98.5 F (36.9 C) 100 F (37.8 C) 99 F (37.2 C)  TempSrc:  Oral Oral Oral  SpO2:  99% 94% 97%  Weight:      Height:        Intake/Output Summary (Last 24 hours) at 05/14/16 0815 Last data filed at 05/14/16 6861  Gross per 24 hour  Intake             1955 ml  Output                0 ml  Net             1955 ml   Filed Weights   05/11/16 1625  Weight: 55.5 kg (122 lb 4.8 oz)    Examination:  General exam: No distress   Respiratory system: No wheezing , no rhonchi, bilateral air entry  Cardiovascular system: S1 & S2 heard, Rate controlled  Gastrointestinal system: (+) BS, non tender, non distended  Central nervous system: Nonfocal  Extremities: No swelling, palpable pulses bilaterally  Skin: warm and dry  Psychiatry: Normal mood and behavior    Data Reviewed: I have personally reviewed following labs and imaging studies  CBC:  Recent Labs  Lab 05/08/16 0804 05/11/16 1111 05/11/16 1500 05/12/16 0524 05/13/16 0618 05/14/16 0625  WBC 1.6* 2.9* 2.2* 0.8* 13.1* 13.7*  NEUTROABS 1.2* 1.9 1.5*  --   --   --   HGB 11.1* 10.8* 9.6* 9.0* 9.3* 9.6*  HCT 33.9* 32.8* 29.7* 28.2* 29.1* 30.0*  MCV 87.1 87.2 87.4 85.7 85.3 86.0  PLT 98* 57* 45* 37* 45* 42*   Basic Metabolic Panel:  Recent Labs Lab 05/08/16 0804 05/11/16 1111 05/12/16 0524 05/13/16 0618 05/14/16 0625  NA 142 136 140 139 137  K 4.2 3.8 3.3* 3.2* 3.5  CL  --  102 112* 112* 110  CO2 '22 26 23 22 24  ' GLUCOSE 166* 82 110* 91 85  BUN 12.'5 16 8 8 7  ' CREATININE 0.6 0.57 0.49 0.65 0.54  CALCIUM 9.1 9.2 7.4* 7.8* 7.9*   GFR: Estimated Creatinine Clearance: 68.8 mL/min (by C-G  formula based on SCr of 0.54 mg/dL). Liver Function Tests:  Recent Labs Lab 05/08/16 0804 05/11/16 1111  AST 45* 49*  ALT 20 18  ALKPHOS 146 124  BILITOT <0.22 0.3  PROT 6.5  6.0 6.0*  ALBUMIN 3.3* 3.0*   No results for input(s): LIPASE, AMYLASE in the last 168 hours. No results for input(s): AMMONIA in the last 168 hours. Coagulation Profile:  Recent Labs Lab 05/11/16 1111  INR 0.92   Cardiac Enzymes: No results for input(s): CKTOTAL, CKMB, CKMBINDEX, TROPONINI in the last 168 hours. BNP (last 3 results) No results for input(s): PROBNP in the last 8760 hours. HbA1C: No results for input(s): HGBA1C in the last 72 hours. CBG:  Recent Labs Lab 05/13/16 1216 05/13/16 1641 05/13/16 1759 05/13/16 2104 05/14/16 0729  GLUCAP 80 71 81 93 82   Lipid Profile: No results for input(s): CHOL, HDL, LDLCALC, TRIG, CHOLHDL, LDLDIRECT in the last 72 hours. Thyroid Function Tests: No results for input(s): TSH, T4TOTAL, FREET4, T3FREE, THYROIDAB in the last 72 hours. Anemia Panel: No results for input(s): VITAMINB12, FOLATE, FERRITIN, TIBC, IRON, RETICCTPCT in the last 72 hours. Urine analysis:    Component Value Date/Time   COLORURINE YELLOW 05/11/2016 1111   APPEARANCEUR CLOUDY (A) 05/11/2016 1111   LABSPEC 1.014 05/11/2016 1111   LABSPEC 1.010 04/09/2016 1356   PHURINE 7.0 05/11/2016 1111   GLUCOSEU NEGATIVE 05/11/2016 1111   GLUCOSEU Negative 04/09/2016 1356   HGBUR NEGATIVE 05/11/2016 1111   BILIRUBINUR NEGATIVE 05/11/2016 1111   BILIRUBINUR Negative 04/09/2016 1356   KETONESUR NEGATIVE 05/11/2016 1111   PROTEINUR NEGATIVE 05/11/2016 1111   UROBILINOGEN 0.2 04/09/2016 1356   NITRITE NEGATIVE 05/11/2016 1111   LEUKOCYTESUR NEGATIVE 05/11/2016 1111   LEUKOCYTESUR Moderate 04/09/2016 1356   Sepsis Labs: '@[email or web address]' (procalcitonin:4,lacticidven:4)   Recent Results (from the past 240 hour(s))  MRSA PCR Screening     Status: None   Collection Time: 05/11/16  3:55 PM   Result Value Ref Range Status   MRSA by PCR NEGATIVE NEGATIVE Final      Radiology Studies: Dg Chest 2 View Result Date: 05/11/2016 Mild patchy bilateral lower lobe opacities, atelectasis versus pneumonia.   Dg Chest 2 View Result Date: 05/08/2016 No acute disease.    Scheduled Meds: . acyclovir  800 mg Oral BID  . ceFEPime (MAXIPIME) IV  2 g Intravenous Q8H  . entecavir  0.5 mg Oral Daily  . feeding supplement  1 Container Oral TID BM  . insulin aspart  0-5 Units Subcutaneous QHS  . insulin aspart  0-9 Units Subcutaneous TID WC  . mouth rinse  15 mL Mouth Rinse BID  . ondansetron (ZOFRAN) IV  4 mg Intravenous Q6H  . oseltamivir  75 mg Oral BID  . pantoprazole  40 mg Oral QHS   Continuous Infusions: . sodium chloride 75 mL/hr at 05/14/16 0705     LOS: 3 days    Time spent: 25 minutes  Greater than 50% of the time spent on counseling and coordinating the care.   Leisa Lenz, MD [facility] Pager [phone]  If 7PM-7AM, please contact night-coverage [email or web address] Password [facility] 05/14/2016, 8:15 AM

## 2016-05-15 ENCOUNTER — Inpatient Hospital Stay (HOSPITAL_COMMUNITY): Payer: BLUE CROSS/BLUE SHIELD

## 2016-05-15 ENCOUNTER — Ambulatory Visit: Payer: BLUE CROSS/BLUE SHIELD | Admitting: Hematology

## 2016-05-15 ENCOUNTER — Other Ambulatory Visit: Payer: BLUE CROSS/BLUE SHIELD

## 2016-05-15 ENCOUNTER — Ambulatory Visit: Payer: BLUE CROSS/BLUE SHIELD

## 2016-05-15 DIAGNOSIS — D696 Thrombocytopenia, unspecified: Secondary | ICD-10-CM

## 2016-05-15 DIAGNOSIS — R1110 Vomiting, unspecified: Secondary | ICD-10-CM

## 2016-05-15 DIAGNOSIS — R630 Anorexia: Secondary | ICD-10-CM

## 2016-05-15 DIAGNOSIS — D72819 Decreased white blood cell count, unspecified: Secondary | ICD-10-CM

## 2016-05-15 DIAGNOSIS — D6959 Other secondary thrombocytopenia: Secondary | ICD-10-CM

## 2016-05-15 DIAGNOSIS — R110 Nausea: Secondary | ICD-10-CM

## 2016-05-15 DIAGNOSIS — C9012 Plasma cell leukemia in relapse: Secondary | ICD-10-CM

## 2016-05-15 LAB — GLUCOSE, CAPILLARY
GLUCOSE-CAPILLARY: 138 mg/dL — AB (ref 65–99)
GLUCOSE-CAPILLARY: 97 mg/dL (ref 65–99)
Glucose-Capillary: 90 mg/dL (ref 65–99)
Glucose-Capillary: 80 mg/dL (ref 65–99)

## 2016-05-15 LAB — CBC
HEMATOCRIT: 32.8 % — AB (ref 36.0–46.0)
HEMOGLOBIN: 10.5 g/dL — AB (ref 12.0–15.0)
MCH: 28.2 pg (ref 26.0–34.0)
MCHC: 32 g/dL (ref 30.0–36.0)
MCV: 87.9 fL (ref 78.0–100.0)
Platelets: 45 10*3/uL — ABNORMAL LOW (ref 150–400)
RBC: 3.73 MIL/uL — ABNORMAL LOW (ref 3.87–5.11)
RDW: 17.7 % — ABNORMAL HIGH (ref 11.5–15.5)
WBC: 6.3 10*3/uL (ref 4.0–10.5)

## 2016-05-15 MED ORDER — PROMETHAZINE HCL 25 MG/ML IJ SOLN
25.0000 mg | Freq: Four times a day (QID) | INTRAMUSCULAR | Status: DC | PRN
  Administered 2016-05-15 (×2): 25 mg via INTRAVENOUS
  Filled 2016-05-15 (×2): qty 1

## 2016-05-15 NOTE — Progress Notes (Signed)
Patient ID: Sheryl [name], female   DOB: 08/28/1970, 45 y.o.   MRN: [id number]  PROGRESS NOTE    Sheryl [name]  MRN:[id number] DOB: 06/26/1970 DOA: 05/11/2016  PCP: Dwight M Williams, MD   Brief Narrative:  45 y.o. 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  She also had 5 episodes of vomiting the day PTA and about 3 episodes of diarrhea. She was febrile on the admission, Tmax 101.7F, HR in 100's. Chest x-ray showed bilateral infiltrates. Influenza PCR was positive. She was started on broad-spectrum antibiotics including Tamiflu for influenza.   Assessment & Plan:   Sepsis secondary to influenza pneumonia and HCAP - Sepsis criteria met on admission with fever, tachycardia, tachypnea, hypotension and hypoxia - Lactic acid within normal limits - Source of infection likely influenza pneumonia as influenza PCR positive - Chest x-ray showed lower lobe infiltrates concerning for pneumonia - Patient was on broad-spectrum antibiotics, cefepime and vancomycin but because no evidence of MRSA infection stopped Vanco 4/9. 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 Feng   Procedures:   None   Antimicrobials:   Cefepime 05/11/2016 -->  Entecavir  Levaquin 05/11/2016 --> 05/12/2016  Tamiflu 05/11/2016 -->  Vanco 05/11/2016 --> 05/12/2016    Subjective: Has nausea and vomiting.   Objective: Vitals:   05/14/16 0520 05/14/16 1408 05/14/16 2129 05/15/16 0359  BP: (!) 96/58 98/64 120/67 103/62  Pulse: 83 91 92 83  Resp: 20 17 16 16  Temp: 99 F (37.2 C) 99.2 F (37.3 C) 99 F (37.2 C) 98.5 F (36.9 C)  TempSrc: Oral Oral Oral Oral  SpO2: 97% 98% 98% 97%  Weight:      Height:        Intake/Output Summary (Last 24 hours) at 05/15/16 0926 Last data filed at 05/14/16 1800  Gross per 24 hour  Intake             3095 ml  Output                0 ml  Net             3095 ml   Filed Weights   05/11/16 1625  Weight: 55.5 kg (122 lb 4.8 oz)    Examination:  General exam: No distress, calm and comfortable  Respiratory system: No wheezing , no rhonchi Cardiovascular system: S1 & S2 heard, RRR Gastrointestinal system: (+) BS, non tender, non distended  Central nervous system: No focal deficits  Extremities: bilateral pulses, no   edema  Skin: warm and dry, no lesions, no ulcers  Psychiatry: Normal mood, no restlessness   Data Reviewed: I have personally reviewed following labs and imaging studies  CBC:  Recent Labs Lab 05/11/16 1111 05/11/16 1500 05/12/16 0524 05/13/16 0618 05/14/16 0625  WBC 2.9* 2.2* 0.8* 13.1* 13.7*  NEUTROABS 1.9 1.5*  --   --   --   HGB 10.8* 9.6* 9.0* 9.3* 9.6*  HCT 32.8* 29.7* 28.2* 29.1* 30.0*  MCV 87.2 87.4 85.7 85.3 86.0  PLT 57* 45* 37* 45* 42*   Basic Metabolic Panel:  Recent Labs Lab 05/11/16 1111 05/12/16 0524 05/13/16 0618 05/14/16 0625  NA 136 140 139 137  K 3.8 3.3* 3.2* 3.5  CL 102 112* 112* 110  CO2 _0 GLUCOSE 82 110* 91 85  BUN _1 CREATININE 0.57 0.49  0.65 0.54  CALCIUM 9.2 7.4* 7.8* 7.9*   GFR: Estimated Creatinine Clearance: 68.8 mL/min (by C-G formula based on SCr of 0.54 mg/dL). Liver Function Tests:  Recent Labs Lab 05/11/16 1111  AST 49*  ALT 18  ALKPHOS 124  BILITOT 0.3  PROT 6.0*  ALBUMIN 3.0*   No results for input(s): LIPASE, AMYLASE in the last 168 hours. No results for input(s): AMMONIA in the last 168 hours. Coagulation Profile:  Recent Labs Lab 05/11/16 1111  INR 0.92   Cardiac Enzymes: No results for input(s): CKTOTAL, CKMB, CKMBINDEX, TROPONINI in the last 168 hours. BNP (last 3 results) No results for input(s): PROBNP in the last 8760 hours. HbA1C: No results for input(s): HGBA1C in the last 72 hours. CBG:  Recent Labs Lab 05/14/16 0729 05/14/16 1155 05/14/16 1702 05/14/16 2127 05/15/16 0730  GLUCAP 82 127* 108* 122* 90   Lipid Profile: No results for input(s): CHOL, HDL, LDLCALC, TRIG, CHOLHDL, LDLDIRECT in the last 72 hours. Thyroid Function Tests: No results for input(s): TSH, T4TOTAL, FREET4, T3FREE, THYROIDAB in the last 72 hours. Anemia Panel: No results for input(s): VITAMINB12, FOLATE, FERRITIN, TIBC, IRON, RETICCTPCT in the last 72 hours. Urine analysis:    Component Value Date/Time   COLORURINE YELLOW 05/11/2016 1111   APPEARANCEUR CLOUDY (A) 05/11/2016 1111   LABSPEC 1.014 05/11/2016 1111   LABSPEC 1.010 04/09/2016 1356   PHURINE 7.0 05/11/2016 1111   GLUCOSEU NEGATIVE 05/11/2016 1111   GLUCOSEU Negative 04/09/2016 1356   HGBUR NEGATIVE 05/11/2016 1111   BILIRUBINUR NEGATIVE 05/11/2016 1111   BILIRUBINUR Negative 04/09/2016 1356   KETONESUR NEGATIVE 05/11/2016 1111   PROTEINUR NEGATIVE 05/11/2016 1111   UROBILINOGEN 0.2 04/09/2016 1356   NITRITE NEGATIVE 05/11/2016 1111   LEUKOCYTESUR NEGATIVE 05/11/2016 1111   LEUKOCYTESUR Moderate 04/09/2016 1356   Sepsis Labs: _2 (procalcitonin:4,lacticidven:4)   Recent Results (from the past 240 hour(s))  MRSA PCR  Screening     Status: None   Collection Time: 05/11/16  3:55 PM  Result Value Ref Range Status   MRSA by PCR NEGATIVE NEGATIVE Final      Radiology Studies: Dg Chest 2 View Result Date: 05/11/2016 Mild patchy bilateral lower lobe opacities, atelectasis versus pneumonia.   Dg Chest 2 View Result Date: 05/08/2016 No acute disease.    Scheduled Meds: . acyclovir  800 mg Oral BID  . ceFEPime (MAXIPIME) IV  2 g Intravenous Q8H  . entecavir  0.5 mg Oral Daily  . feeding supplement  1 Container Oral TID BM  . insulin aspart  0-5 Units Subcutaneous QHS  . insulin aspart  0-9 Units Subcutaneous TID WC  .  mouth rinse  15 mL Mouth Rinse BID  . oseltamivir  75 mg Oral BID  . pantoprazole  40 mg Oral QHS   Continuous Infusions: . sodium chloride 75 mL/hr at 05/15/16 0546     LOS: 4 days    Time spent: 25 minutes  Greater than 50% of the time spent on counseling and coordinating the care.   Leisa Lenz, MD [facility] Pager [phone]  If 7PM-7AM, please contact night-coverage [email or web address] Password [facility] 05/15/2016, 9:26 AM

## 2016-05-15 NOTE — Progress Notes (Signed)
Pharmacy Antibiotic Note  Sheryl [name] is a 46 y.o. female admitted on 05/11/2016 with acute plasma cell leukemia presenting on 05/11/2016 with fever, SOB, and cough. Unable to complete chemo on 4/5 and developed worsening symptoms over the weekend. Flu panel positive for Influenza B and CXR suggestive of PNA. Pharmacy consulted to dose acyclovir for VZV ppx and cefepime for FN.  Today, 05/15/2016:  WBC back up, ANC likely improved d/t Neulasta prior to admit  Otherwise stable from ID standpoint  Plan:  Continue Acyclovir 800mg  po twice daily.  Continue Cefepime 2gm IV q8h  Follow renal function   Height: 5\' 2"  (157.5 cm) Weight: 122 lb 4.8 oz (55.5 kg) IBW/kg (Calculated) : 50.1  Temp (24hrs), Avg:98.9 F (37.2 C), Min:98.5 F (36.9 C), Max:99.2 F (37.3 C)   Recent Labs Lab 05/11/16 1111 05/11/16 1140 05/11/16 1500 05/11/16 1512 05/12/16 0524 05/13/16 0618 05/14/16 0625  WBC 2.9*  --  2.2*  --  0.8* 13.1* 13.7*  CREATININE 0.57  --   --   --  0.49 0.65 0.54  LATICACIDVEN  --  0.79  --  0.68  --   --   --     Estimated Creatinine Clearance: 68.8 mL/min (by C-G formula based on SCr of 0.54 mg/dL).    No Known Allergies  Antimicrobials this admission: Cefepime x 1 in ED and continued on 4/9 >> Vancomycin 4/8 >> 4/9 Levaquin 4/8 >> 4/9 Tamiflu 4/8>> (4/13) Acyclovir, Entecavir 4/9 >>  Microbiology results: 4/8 BCx: ngtd 4/8 Influenza panel: flu B+ 4/8 MRSA PCR: neg - Hx ESBL E coli in urine  Thank you for allowing pharmacy to be a part of this patient's care.  Reuel Boom, PharmD, BCPS Pager: [phone] 05/15/2016, 10:25 AM

## 2016-05-15 NOTE — Progress Notes (Signed)
Sheryl [name]   DOB:12-20-1970   GB#:[id number]   FXJ#:[id number]  Oncology follow up note   Subjective: Pt still feel nauseated, not eating well. Fever has resolved, VS stable, neutropenia resolved, plt still low, 45K today.    Objective:  Vitals:   05/15/16 0359 05/15/16 1515  BP: 103/62 97/63  Pulse: 83 78  Resp: 16 17  Temp: 98.5 F (36.9 C) 98.8 F (37.1 C)    Body mass index is 22.37 kg/m.  Intake/Output Summary (Last 24 hours) at 05/15/16 1910 Last data filed at 05/15/16 1800  Gross per 24 hour  Intake              350 ml  Output                0 ml  Net              350 ml     Sclerae unicteric  Oropharynx clear  No peripheral adenopathy  Lungs clear -- no rales or rhonchi  Heart regular rate and rhythm  Abdomen benign  MSK no focal spinal tenderness, no peripheral edema  Neuro nonfocal   CBG (last 3)   Recent Labs  05/15/16 0730 05/15/16 1200 05/15/16 1751  GLUCAP 90 138* 80     Labs:  Lab Results  Component Value Date   WBC 6.3 05/15/2016   HGB 10.5 (L) 05/15/2016   HCT 32.8 (L) 05/15/2016   MCV 87.9 05/15/2016   PLT 45 (L) 05/15/2016   NEUTROABS 1.5 (L) 05/11/2016   CMP Latest Ref Rng & Units 05/14/2016 05/13/2016 05/12/2016  Glucose 65 - 99 mg/dL 85 91 110(H)  BUN 6 - 20 mg/dL 7 8 8   Creatinine 0.44 - 1.00 mg/dL 0.54 0.65 0.49  Sodium 135 - 145 mmol/L 137 139 140  Potassium 3.5 - 5.1 mmol/L 3.5 3.2(L) 3.3(L)  Chloride 101 - 111 mmol/L 110 112(H) 112(H)  CO2 22 - 32 mmol/L 24 22 23   Calcium 8.9 - 10.3 mg/dL 7.9(L) 7.8(L) 7.4(L)  Total Protein 6.5 - 8.1 g/dL - - -  Total Bilirubin 0.3 - 1.2 mg/dL - - -  Alkaline Phos 38 - 126 U/L - - -  AST 15 - 41 U/L - - -  ALT 14 - 54 U/L - - -     Urine Studies No results for input(s): UHGB, CRYS in the last 72 hours.  Invalid input(s): UACOL, UAPR, USPG, UPH, UTP, UGL, UKET, UBIL, UNIT, UROB, ULEU, UEPI, UWBC, URBC, UBAC, CAST, [place], [place]  Basic Metabolic Panel:  Recent Labs Lab 05/11/16 1111  05/12/16 0524 05/13/16 0618 05/14/16 0625  NA 136 140 139 137  K 3.8 3.3* 3.2* 3.5  CL 102 112* 112* 110  CO2 26 23 22 24   GLUCOSE 82 110* 91 85  BUN 16 8 8 7   CREATININE 0.57 0.49 0.65 0.54  CALCIUM 9.2 7.4* 7.8* 7.9*   GFR Estimated Creatinine Clearance: 68.8 mL/min (by C-G formula based on SCr of 0.54 mg/dL). Liver Function Tests:  Recent Labs Lab 05/11/16 1111  AST 49*  ALT 18  ALKPHOS 124  BILITOT 0.3  PROT 6.0*  ALBUMIN 3.0*   No results for input(s): LIPASE, AMYLASE in the last 168 hours. No results for input(s): AMMONIA in the last 168 hours. Coagulation profile  Recent Labs Lab 05/11/16 1111  INR 0.92    CBC:  Recent Labs Lab 05/11/16 1111 05/11/16 1500 05/12/16 0524 05/13/16 0618 05/14/16 0625 05/15/16 1100  WBC 2.9* 2.2* 0.8*  13.1* 13.7* 6.3  NEUTROABS 1.9 1.5*  --   --   --   --   HGB 10.8* 9.6* 9.0* 9.3* 9.6* 10.5*  HCT 32.8* 29.7* 28.2* 29.1* 30.0* 32.8*  MCV 87.2 87.4 85.7 85.3 86.0 87.9  PLT 57* 45* 37* 45* 42* 45*   Cardiac Enzymes: No results for input(s): CKTOTAL, CKMB, CKMBINDEX, TROPONINI in the last 168 hours. BNP: Invalid input(s): POCBNP CBG:  Recent Labs Lab 05/14/16 1702 05/14/16 2127 05/15/16 0730 05/15/16 1200 05/15/16 1751  GLUCAP 108* 122* 90 138* 80   D-Dimer No results for input(s): DDIMER in the last 72 hours. Hgb A1c No results for input(s): HGBA1C in the last 72 hours. Lipid Profile No results for input(s): CHOL, HDL, LDLCALC, TRIG, CHOLHDL, LDLDIRECT in the last 72 hours. Thyroid function studies No results for input(s): TSH, T4TOTAL, T3FREE, THYROIDAB in the last 72 hours.  Invalid input(s): FREET3 Anemia work up No results for input(s): VITAMINB12, FOLATE, FERRITIN, TIBC, IRON, RETICCTPCT in the last 72 hours. Microbiology Recent Results (from the past 240 hour(s))  Culture, blood (Routine x 2)     Status: None (Preliminary result)   Collection Time: 05/11/16 11:40 AM  Result Value Ref Range  Status   Specimen Description BLOOD PORTA CATH RIGHT  Final   Special Requests   Final    BOTTLES DRAWN AEROBIC AND ANAEROBIC Blood Culture adequate volume   Culture   Final    NO GROWTH 4 DAYS Performed at [facility], [address], [place], [place] [address]    Report Status PENDING  Incomplete  Culture, blood (Routine x 2)     Status: None (Preliminary result)   Collection Time: 05/11/16 11:40 AM  Result Value Ref Range Status   Specimen Description BLOOD LEFT ANTECUBITAL  Final   Special Requests   Final    BOTTLES DRAWN AEROBIC AND ANAEROBIC Blood Culture adequate volume   Culture   Final    NO GROWTH 4 DAYS Performed at [facility], [place] [address] [place], [place] [address]    Report Status PENDING  Incomplete  MRSA PCR Screening     Status: None   Collection Time: 05/11/16  3:55 PM  Result Value Ref Range Status   MRSA by PCR NEGATIVE NEGATIVE Final    Comment:        The GeneXpert MRSA Assay (FDA approved for NASAL specimens only), is one component of a comprehensive MRSA colonization surveillance program. It is not intended to diagnose MRSA infection nor to guide or monitor treatment for MRSA infections.       Studies:  Dg Abd Portable 1v  Result Date: 05/15/2016 CLINICAL DATA:  Vomiting EXAM: PORTABLE ABDOMEN - 1 VIEW COMPARISON:  CT 05/20/2015 FINDINGS: Multiple stones noted in the lower pole of the left kidney, the largest 10 mm, stable since prior CT. No suspicious calcification over the right kidney or expected course of the ureters. Nonobstructive bowel gas pattern. No free air organomegaly. IMPRESSION: Left lower pole nephrolithiasis. No acute findings. Electronically Signed   By: Rolm Baptise M.D.   On: 05/15/2016 09:07    Assessment: 46 y.o. with relapsed acute plasma cell leukemia, on chemotherapy, was admitted for pneumonia and pancytopenia  1. Sepsis secondary to influenza pneumonia and HCAP, improving  2. Febrile neutropenia,  resolved   3. Anemia and thrombocytopenia secondary to chemotherapy, stable  4. Relapsed acute plasma cell leukemia 5. Hypokalemia, resolved  6. Nausea and anorexia, secondary to sepsis and  medications    Plan:  -She is recovering slowly, sepsis has much improved  -I encourage her to bb out of bed, and ambulate if she can, PT/OT, to recover sooner  -continue supportive care -likely need to hold her treatment next week too. If she is discharged home this weekend, I will see her late next week to see if she will be ready for treatment around 4/26. Due to her cytopenia and infection, I will likely deescalate her chemo.    Truitt Merle  05/15/2016

## 2016-05-16 LAB — BASIC METABOLIC PANEL
Anion gap: 5 (ref 5–15)
BUN: 12 mg/dL (ref 6–20)
CHLORIDE: 109 mmol/L (ref 101–111)
CO2: 26 mmol/L (ref 22–32)
Calcium: 8.4 mg/dL — ABNORMAL LOW (ref 8.9–10.3)
Creatinine, Ser: 0.49 mg/dL (ref 0.44–1.00)
GFR calc Af Amer: >60 mL/min (ref >60)
GFR calc non Af Amer: >60 mL/min (ref >60)
Glucose, Bld: 98 mg/dL (ref 65–99)
POTASSIUM: 3.4 mmol/L — AB (ref 3.5–5.1)
Sodium: 140 mmol/L (ref 135–145)

## 2016-05-16 LAB — CULTURE, BLOOD (ROUTINE X 2)
CULTURE: NO GROWTH
Culture: NO GROWTH
SPECIAL REQUESTS: ADEQUATE
SPECIAL REQUESTS: ADEQUATE

## 2016-05-16 LAB — GLUCOSE, CAPILLARY: Glucose-Capillary: 84 mg/dL (ref 65–99)

## 2016-05-16 LAB — CBC
HCT: 32.6 % — ABNORMAL LOW (ref 36.0–46.0)
Hemoglobin: 10.4 g/dL — ABNORMAL LOW (ref 12.0–15.0)
MCH: 28.1 pg (ref 26.0–34.0)
MCHC: 31.9 g/dL (ref 30.0–36.0)
MCV: 88.1 fL (ref 78.0–100.0)
PLATELETS: 41 10*3/uL — AB (ref 150–400)
RBC: 3.7 MIL/uL — AB (ref 3.87–5.11)
RDW: 17.7 % — ABNORMAL HIGH (ref 11.5–15.5)
WBC: 3.5 10*3/uL — ABNORMAL LOW (ref 4.0–10.5)

## 2016-05-16 MED ORDER — MAGIC MOUTHWASH W/LIDOCAINE: 15.0000 mL | Freq: Four times a day (QID) | ORAL | 0 refills | Status: DC | PRN

## 2016-05-16 MED ORDER — POTASSIUM CHLORIDE CRYS ER 20 MEQ PO TBCR
40.0000 meq | EXTENDED_RELEASE_TABLET | Freq: Once | ORAL | Status: AC
  Administered 2016-05-16: 40 meq via ORAL
  Filled 2016-05-16: qty 2

## 2016-05-16 MED ORDER — HEPARIN SOD (PORK) LOCK FLUSH 100 UNIT/ML IV SOLN
500.0000 [IU] | INTRAVENOUS | Status: DC | PRN
  Filled 2016-05-16: qty 5

## 2016-05-16 MED ORDER — ACETAMINOPHEN 325 MG PO TABS: 650.0000 mg | ORAL_TABLET | Freq: Four times a day (QID) | ORAL | 0 refills | Status: DC | PRN

## 2016-05-16 MED ORDER — BOOST / RESOURCE BREEZE PO LIQD: 1.0000 | Freq: Three times a day (TID) | ORAL | 0 refills | Status: DC

## 2016-05-16 MED ORDER — LEVOFLOXACIN 500 MG PO TABS: 500.0000 mg | ORAL_TABLET | Freq: Every day | ORAL | 0 refills | Status: DC

## 2016-05-16 MED ORDER — PROMETHAZINE HCL 25 MG PO TABS: 25.0000 mg | ORAL_TABLET | Freq: Four times a day (QID) | ORAL | 0 refills | Status: DC | PRN

## 2016-05-16 NOTE — Discharge Instructions (Signed)
Levofloxacin tablets What is this medicine? LEVOFLOXACIN (lee voe FLOX a sin) is a quinolone antibiotic. It is used to treat certain kinds of bacterial infections. It will not work for colds, flu, or other viral infections. This medicine may be used for other purposes; ask your health care provider or pharmacist if you have questions. COMMON BRAND NAME(S): Levaquin, Levaquin Leva-Pak What should I tell my health care provider before I take this medicine? They need to know if you have any of these conditions: -bone problems -diabetes -history of low levels of potassium in the blood -irregular heartbeat -joint problems -kidney disease -liver disease -myasthenia gravis -seizures -tendon problems -tingling of the fingers or toes, or other nerve disorder -an unusual or allergic reaction to levofloxacin, other quinolone antibiotics, foods, dyes, or preservatives -pregnant or trying to get pregnant -breast-feeding How should I use this medicine? Take this medicine by mouth with a full glass of water. Follow the directions on the prescription label. This medicine can be taken with or without food. Take your medicine at regular intervals. Do not take your medicine more often than directed. Do not skip doses or stop your medicine early even if you feel better. Do not stop taking except on your doctor's advice. A special MedGuide will be given to you by the pharmacist with each prescription and refill. Be sure to read this information carefully each time. Talk to your pediatrician regarding the use of this medicine in children. While this drug may be prescribed for children as young as 6 months for selected conditions, precautions do apply. Overdosage: If you think you have taken too much of this medicine contact a poison control center or emergency room at once. NOTE: This medicine is only for you. Do not share this medicine with others. What if I miss a dose? If you miss a dose, take it as soon as  you remember. If it is almost time for your next dose, take only that dose. Do not take double or extra doses. What may interact with this medicine? Do not take this medicine with any of the following medications: -bepridil -certain medicines for depression, anxiety, or psychotic disturbances like pimozide, thioridazine, and ziprasidone -certain medicines for irregular heart beat like dofetilide and dronedarone -cisapride -halofantrine This medicine may also interact with the following medications: -antacids -birth control pills -certain medicines for diabetes, like glipizide, glyburide, or insulin -didanosine buffered tablets or powder -multivitamins -NSAIDS, medicines for pain and inflammation, like ibuprofen or naproxen -steroid medicines like prednisone or cortisone -sucralfate -theophylline -warfarin This list may not describe all possible interactions. Give your health care provider a list of all the medicines, herbs, non-prescription drugs, or dietary supplements you use. Also tell them if you smoke, drink alcohol, or use illegal drugs. Some items may interact with your medicine. What should I watch for while using this medicine? Tell your doctor or healthcare professional if your symptoms do not start to get better or if they get worse. Do not treat diarrhea with over the counter products. Contact your doctor if you have diarrhea that lasts more than 2 days or if it is severe and watery. Check with your doctor or health care professional if you get an attack of severe diarrhea, nausea and vomiting, or if you sweat a lot. The loss of too much body fluid can make it dangerous for you to take this medicine. You may get drowsy or dizzy. Do not drive, use machinery, or do anything that needs mental alertness until you   know how this medicine affects you. Do not sit or stand up quickly, especially if you are an older patient. This reduces the risk of dizzy or fainting spells. This medicine  can make you more sensitive to the sun. Keep out of the sun. If you cannot avoid being in the sun, wear protective clothing and use a sunscreen. Do not use sun lamps or tanning beds/booths. Contact your doctor if you get a sunburn. If you are a diabetic monitor your blood glucose carefully. If you get an unusual reading stop taking this medicine and call your doctor right away. Avoid antacids, calcium, iron, and zinc products for 2 hours before and 2 hours after taking a dose of this medicine. What side effects may I notice from receiving this medicine? Side effects that you should report to your doctor or health care professional as soon as possible: -allergic reactions like skin rash or hives, swelling of the face, lips, or tongue -anxious -breathing problems -confusion -depressed mood -diarrhea -dizziness -fast, irregular heartbeat -hallucination, loss of contact with reality -joint, muscle, or tendon pain or swelling -muscle weakness -pain, tingling, numbness in the hands or feet -seizures -signs and symptoms of high blood sugar such as dizziness; dry mouth; dry skin; fruity breath; nausea; stomach pain; increased hunger or thirst; increased urination -signs and symptoms of liver injury like dark yellow or brown urine; general ill feeling or flu-like symptoms; light-colored stools; loss of appetite; nausea; right upper belly pain; unusually weak or tired; yellowing of the eyes or skin -signs and symptoms of low blood sugar such as feeling anxious; confusion; dizziness; increased hunger; unusually weak or tired; sweating; shakiness; cold; irritable; headache; blurred vision; fast heartbeat; loss of consciousness -suicidal thoughts or other mood changes -sunburn -unusually weak or tired Side effects that usually do not require medical attention (report to your doctor or health care professional if they continue or are bothersome): -constipation -dry mouth -headache -nausea,  vomiting -trouble sleeping This list may not describe all possible side effects. Call your doctor for medical advice about side effects. You may report side effects to FDA at 1-800-FDA-1088. Where should I keep my medicine? Keep out of the reach of children. Store at room temperature between 15 and 30 degrees C (59 and 86 degrees F). Keep in a tightly closed container. Throw away any unused medicine after the expiration date. NOTE: This sheet is a summary. It may not cover all possible information. If you have questions about this medicine, talk to your doctor, pharmacist, or health care provider.  2018 Elsevier/Gold Standard (2015-07-31 12:38:27)  

## 2016-05-16 NOTE — Progress Notes (Signed)
Nursing Discharge Summary  Patient ID: Ladye Macnaughton MRN: [id number] DOB/AGE: 10/09/1970 46 y.o.  Admit date: 05/11/2016 Discharge date: 05/16/2016  Discharged Condition: good  Disposition: 01-Home or Self Care  Follow-up Information    [name], MD. Schedule an appointment as soon as possible for a visit in 1 week(s).   Specialty:  Family Medicine Contact information: [place] [address] [phone]        Truitt Merle, MD. Schedule an appointment as soon as possible for a visit in 1 week(s).   Specialties:  Hematology, Oncology Contact information: [place] [address] [phone]           Prescriptions Given: Prescriptions given to patient.  Patient medications and follow up appointments dicussed with her, daughter and her mother.  All verbalized understanding without further questions.   Means of Discharge: Patient taken downstairs via wheelchair to be discharged home via private vehicle.    Signed: Buel Ream 05/16/2016, 12:37 PM

## 2016-05-16 NOTE — Care Management Note (Signed)
Case Management Note  Patient Details  Name: Sheryl [name] MRN: [id number] Date of Birth: May 04, 1970  Subjective/Objective:     46 y.o. with relapsed acute plasma cell leukemia, on chemotherapy, was admitted for pneumonia and pancytopenia               Action/Plan: From home with spouse. Pt independent with ADLs and has insurance. No CM needs identified or communicated.  Expected Discharge Date:  05/13/16               Expected Discharge Plan:     In-House Referral:     Discharge planning Services     Post Acute Care Choice:    Choice offered to:     DME Arranged:    DME Agency:     HH Arranged:    HH Agency:     Status of Service:     If discussed at [employer], dates discussed:    Additional CommentsLynnell Catalan, RN 05/16/2016, 10:41 AM [phone]

## 2016-05-16 NOTE — Discharge Summary (Signed)
Physician Discharge Summary  Pietra Zuluaga JKK:[id number] DOB: 1970-06-21 DOA: 05/11/2016  PCP: [name], MD  Admit date: 05/11/2016 Discharge date: 05/16/2016  Recommendations for Outpatient Follow-up:  1. Patient is instructed to follow-up with primary care and oncology per scheduled appointment after discharge. Please repeat white blood cell count and platelet count on outpatient setting to make sure it is stable. 2. Continue Levaquin for 5 days on discharge. 3. Potassium was supplemented prior to discharge.  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  She also had 5 episodes of vomiting the day PTA and about 3 episodes of diarrhea. She was febrile on the admission, Tmax 101.8F, HR in 100's. Chest x-ray showed bilateral infiltrates. Influenza PCR was positive. She was started on broad-spectrum antibiotics including Tamiflu for influenza.  Hospital Course:  Sepsis secondary to influenza pneumonia and HCAP - Sepsis criteria met on admission with fever, tachycardia, tachypnea, hypotension and hypoxia - Lactic acid within normal limits - Source of infection likely influenza pneumonia as influenza PCR positive - Chest x-ray showed lower lobe infiltrates concerning for pneumonia - Patient was on broad-spectrum antibiotics, cefepime and vancomycin but because no evidence of MRSA infection stopped Vanco 4/9. - Stop cefepime today and continue Levaquin for 5 days on  discharge  Nausea and vomiting - Likely from multiple meds, lymphoma - Changed Zofran to phenergan which actually help relieve nausea more so prescription provided for Phenergan on discharge - No obstruction on abdominal x-ray  Febrile neutropenia / Chemotherapy induced neutropenia  - Placed order for Neupogen 4/9 - WBC recovered   Anemia of chronic disease - Likely related to malignancy and sequela of chemotherapy - Hemoglobin stable  Thrombocytopenia - Related to sequela of chemotherapy - Platelet count stable   Hypokalemia  - From sepsis  - Supplemented prior to discharge   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 Burr Medico   Procedures:   None   Antimicrobials:   Cefepime 05/11/2016 --> 05/16/2016  Entecavir  Levaquin 05/11/2016 --> 05/12/2016  Tamiflu 05/11/2016 --> 05/16/2016  Vanco 05/11/2016 --> 05/12/2016    Signed:  Leisa Lenz, MD  [facility] 05/16/2016, 11:07 AM  Pager #: [phone]  Time spent in minutes: less than 30 minutes   Discharge Exam: Vitals:   05/15/16 2023 05/16/16 0351  BP: (!) 91/53 (!) 96/56  Pulse: 87 79  Resp: 16 18  Temp: 98.8 F (37.1 C) 98.4 F (36.9 C)   Vitals:   05/15/16 0359 05/15/16 1515 05/15/16 2023 05/16/16 0351  BP: 103/62 97/63 (!) 91/53 (!) 96/56  Pulse: 83 78 87 79  Resp: _0 Temp: 98.5 F (36.9 C) 98.8 F (37.1 C) 98.8 F (37.1 C) 98.4 F (36.9 C)  TempSrc: Oral Oral Oral Oral  SpO2: 97% 99% 96%  97%  Weight:      Height:        General: Pt is alert, follows commands appropriately, not in acute distress Cardiovascular: Regular rate and rhythm, S1/S2 + Respiratory: Clear to auscultation bilaterally, no wheezing, no crackles, no rhonchi Abdominal: Soft, non tender, non distended, bowel sounds +, no  guarding Extremities: no edema, no cyanosis, pulses palpable bilaterally DP and PT Neuro: Grossly nonfocal  Discharge Instructions  Discharge Instructions    Call MD for:  persistant nausea and vomiting    Complete by:  As directed    Call MD for:  redness, tenderness, or signs of infection (pain, swelling, redness, odor or green/yellow discharge around incision site)    Complete by:  As directed    Call MD for:  severe uncontrolled pain    Complete by:  As directed    Diet - low sodium heart healthy    Complete by:  As directed    Discharge instructions    Complete by:  As directed    Continue Levaquin for 5 more days on discharge   Increase activity slowly    Complete by:  As directed      Allergies as of 05/16/2016   No Known Allergies     Medication List    STOP taking these medications   amoxicillin-clavulanate 875-125 MG tablet Commonly known as:  AUGMENTIN   montelukast 10 MG tablet Commonly known as:  SINGULAIR     TAKE these medications   acetaminophen 325 MG tablet Commonly known as:  TYLENOL Take 2 tablets (650 mg total) by mouth every 6 (six) hours as needed for mild pain (or Fever >/= 101).   acyclovir 800 MG tablet Commonly known as:  ZOVIRAX Take 1 tablet (800 mg total) by mouth 2 (two) times daily.   dexamethasone 4 MG tablet Commonly known as:  DECADRON Take 1 tablet (4 mg total) by mouth 2 (two) times daily with a meal. What changed:  how much to take  when to take this  additional instructions   entecavir 0.5 MG tablet Commonly known as:  BARACLUDE Take 0.5 mg by mouth daily.   feeding supplement Liqd Take 1 Container by mouth 3 (three) times daily between meals.   levofloxacin 500 MG tablet Commonly known as:  LEVAQUIN Take 1 tablet (500 mg total) by mouth daily.   lidocaine-prilocaine cream Commonly known as:  EMLA Apply 1 application topically as directed.   magic mouthwash w/lidocaine Soln Take 15 mLs by mouth 4 (four)  times daily as needed for mouth pain.   metFORMIN 500 MG tablet Commonly known as:  GLUCOPHAGE Take 1 tablet (500 mg total) by mouth 2 (two) times daily with a meal.   omeprazole 20 MG capsule Commonly known as:  PRILOSEC Take 1 capsule (20 mg total) by mouth daily.   ondansetron 8 MG tablet Commonly known as:  ZOFRAN Take 1 tablet (8 mg total) by mouth every 8 (eight) hours as needed for nausea or vomiting.   promethazine 25 MG tablet Commonly known as:  PHENERGAN Take 1 tablet (25 mg total) by mouth every 6 (six) hours as needed for nausea or vomiting.      Follow-up Information    [name], MD. Schedule an appointment as soon as possible for a visit in 1 week(s).   Specialty:  Family Medicine Contact information: [place] [address] [phone]        Truitt Merle, MD. Schedule an appointment  as soon as possible for a visit in 1 week(s).   Specialties:  Hematology, Oncology Contact information: [place] [address] [phone]            The results of significant diagnostics from this hospitalization (including imaging, microbiology, ancillary and laboratory) are listed below for reference.    Significant Diagnostic Studies: Dg Chest 2 View  Result Date: 05/11/2016 CLINICAL DATA:  Shortness of breath, wheezing EXAM: CHEST  2 VIEW COMPARISON:  05/08/2016 FINDINGS: Mild patchy bilateral lower lobe opacities, atelectasis versus pneumonia. No pleural effusion or pneumothorax. The heart is top-normal in size. Right chest power port terminates at cavoatrial junction. IMPRESSION: Mild patchy bilateral lower lobe opacities, atelectasis versus pneumonia. Electronically Signed   By: Julian Hy M.D.   On: 05/11/2016 11:52   Dg Chest 2 View  Result Date: 05/08/2016 CLINICAL DATA:  Fever, cough and sore throat in patient on chemotherapy for leukemia. EXAM: CHEST  2 VIEW COMPARISON:  PA and lateral chest 11/13/2015.  FINDINGS: Right IJ approach Port-A-Cath is unchanged. The lungs are clear. Heart size is normal. No pneumothorax or pleural effusion. No acute bony abnormality. IMPRESSION: No acute disease. Electronically Signed   By: Inge Rise M.D.   On: 05/08/2016 11:13   Dg Abd Portable 1v  Result Date: 05/15/2016 CLINICAL DATA:  Vomiting EXAM: PORTABLE ABDOMEN - 1 VIEW COMPARISON:  CT 05/20/2015 FINDINGS: Multiple stones noted in the lower pole of the left kidney, the largest 10 mm, stable since prior CT. No suspicious calcification over the right kidney or expected course of the ureters. Nonobstructive bowel gas pattern. No free air organomegaly. IMPRESSION: Left lower pole nephrolithiasis. No acute findings. Electronically Signed   By: Rolm Baptise M.D.   On: 05/15/2016 09:07    Microbiology: Recent Results (from the past 240 hour(s))  Culture, blood (Routine x 2)     Status: None (Preliminary result)   Collection Time: 05/11/16 11:40 AM  Result Value Ref Range Status   Specimen Description BLOOD PORTA CATH RIGHT  Final   Special Requests   Final    BOTTLES DRAWN AEROBIC AND ANAEROBIC Blood Culture adequate volume   Culture   Final    NO GROWTH 4 DAYS Performed at [facility], [address], [place], [place] [address]    Report Status PENDING  Incomplete  Culture, blood (Routine x 2)     Status: None (Preliminary result)   Collection Time: 05/11/16 11:40 AM  Result Value Ref Range Status   Specimen Description BLOOD LEFT ANTECUBITAL  Final   Special Requests   Final    BOTTLES DRAWN AEROBIC AND ANAEROBIC Blood Culture adequate volume   Culture   Final    NO GROWTH 4 DAYS Performed at [facility], [place] [address] [place], [place] [address]    Report Status PENDING  Incomplete  MRSA PCR Screening     Status: None   Collection Time: 05/11/16  3:55 PM  Result Value Ref Range Status   MRSA by PCR NEGATIVE NEGATIVE Final    Comment:        The GeneXpert MRSA Assay  (FDA approved for NASAL specimens only), is one component of a comprehensive MRSA colonization surveillance program. It is not intended to diagnose MRSA infection nor to guide or monitor treatment for MRSA infections.      Labs: Basic Metabolic Panel:  Recent Labs Lab 05/11/16 1111 05/12/16 6712 05/13/16 4580 05/14/16 9983 05/16/16 0535  NA  136 140 139 137 140  K 3.8 3.3* 3.2* 3.5 3.4*  CL 102 112* 112* 110 109  CO2 _0 GLUCOSE 82 110* 91 85 98  BUN _1 CREATININE 0.57 0.49 0.65 0.54 0.49  CALCIUM 9.2 7.4* 7.8* 7.9* 8.4*   Liver Function Tests:  Recent Labs Lab 05/11/16 1111  AST 49*  ALT 18  ALKPHOS 124  BILITOT 0.3  PROT 6.0*  ALBUMIN 3.0*   No results for input(s): LIPASE, AMYLASE in the last 168 hours. No results for input(s): AMMONIA in the last 168 hours. CBC:  Recent Labs Lab 05/11/16 1111 05/11/16 1500 05/12/16 0524 05/13/16 0618 05/14/16 0625 05/15/16 1100 05/16/16 0535  WBC 2.9* 2.2* 0.8* 13.1* 13.7* 6.3 3.5*  NEUTROABS 1.9 1.5*  --   --   --   --   --   HGB 10.8* 9.6* 9.0* 9.3* 9.6* 10.5* 10.4*  HCT 32.8* 29.7* 28.2* 29.1* 30.0* 32.8* 32.6*  MCV 87.2 87.4 85.7 85.3 86.0 87.9 88.1  PLT 57* 45* 37* 45* 42* 45* 41*   Cardiac Enzymes: No results for input(s): CKTOTAL, CKMB, CKMBINDEX, TROPONINI in the last 168 hours. BNP: BNP (last 3 results) No results for input(s): BNP in the last 8760 hours.  ProBNP (last 3 results) No results for input(s): PROBNP in the last 8760 hours.  CBG:  Recent Labs Lab 05/15/16 0730 05/15/16 1200 05/15/16 1751 05/15/16 2109 05/16/16 0809  GLUCAP 90 138* 80 97 84

## 2016-05-19 ENCOUNTER — Telehealth: Payer: Self-pay | Admitting: Hematology

## 2016-05-19 NOTE — Telephone Encounter (Signed)
Called patient via [facility] [phone] and left message re next appointments for 4/23 and 4/25. Patient to get new schedule 4/23.

## 2016-05-21 LAB — CULTURE, BLOOD (ROUTINE X 2)
Culture: NO GROWTH
Culture: NO GROWTH
SPECIAL REQUESTS: ADEQUATE
Special Requests: ADEQUATE

## 2016-05-22 NOTE — Progress Notes (Signed)
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      10/10/2014 Miscellaneous    Peripheral blood chemistry and leukocytosis with total white count 78K, comprised of large plasma cells and his normocytic anemia. There is a myeloid left shift with previous surgical radium blasts. Flow cytometry showed 64% plasma cells      10/10/2014 Bone Marrow Biopsy    Markedly hypercellular marrow (95%), Atypical plasma cells comprise 57% of the cellularity. There was diminished multilineage in hematopoiesis with adequate maturation. Breasts less than 1%), no overt dysplasia of the myeloid or erythroid lineages.       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      05/07/2015 - 05/12/2015 Hospital Admission    [facility], [facility]. ID workup was negative, EGD showed evidence of gastritis and duodenitis, no H. pylori or CMV.      05/20/2015 - 05/24/2015 Hospital Admission    [facility]      07/11/2015 Bone Marrow Biopsy    Post transplant 100 a bone marrow biopsy showed hypocellular marrow, 20%, no increase in plasma cells (2%) or other abnormalities.      08/22/2015 - 01/02/2016 Chemotherapy    MaintenanceCyborD (cytoxan 3030mm2 iv, bortezomib 1.5 mg/m, dexamethasone 40 mg, every 2 weeks, changed to Velcade maintenance after 4 months treatment      01/16/2016 - 04/19/2016 Chemotherapy    Maintenance Velcade 1.3 mg/m every 2 weeks      02/22/2016 Pathology Results    BONE MARROW: Diagnosis Bone Marrow, Aspirate,Biopsy, and Clot, right iliac - NORMOCELLULAR BONE MARROW FOR AGE WITH TRILINEAGE HEMATOPOIESIS. - PLASMACYTOSIS (PLASMA CELLS 12%). - SEE COMMENT. PERIPHERAL BLOOD: - OCCASIONAL CIRCULATING PLASMA CELLS.      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 female is here because of recently diagnosed plasma cell leukemia. [facility] and is here to establish her local oncological care with [place] speak [place] is accompanied to the clinic by her daughter and interpreter.  She presented  to our hospital lth with worsening dyspnea, fatigue, cough, subjective fevers and chills, and was admitted on 09/14/2014. She was found to have allergy WBC 54.1K, hemoglobin 8.1, plt 310, she was treated with symptom management, and was discharged home on 09/17/2014, with a plan to follow-up with hematology. She represents to emergency room on 10/07/2014, was found to have white count of 78K, and worsening  anemia with hemoglobin 6. She was seen by my partner Dr. Jonette [name] and plasma cell leukemia was suspected. She was transferred to [facility] for further leukemia work-up and treatment. Bone marrow biopsy was done, which confirmed plasma cell leukemia, and she started chemotherapy with Velcade, Cytoxan and dexamethasone. She received weekly dose twice, last dose on 10/20/2014. She was discharged to home afterwards. She had a mediport placed during her hospitalization.  She has moderate fatigue, low appetie, she lost about 13 lbs in the past few months. She has moderate abdominal pain, 5-6/10, persistent, she does not take any pain meds.   I reviewed her medical records extensively, and discussed her case with Dr. Jorje Guild and [place] program coordinator.  CURRENT THERAPY:   1.Maintenance Velcade 1.34m/m2 every 2 weeks, started on 01/16/2016, changed back to CyBorD every 2 weeks on 02/27/2016, and changed to weekly on 04/18/2016 due to disease relapse, cytoxan held since 04/24/2016 due to cytopenia and infections  2. Daratumumab weekly started on 04/18/2016 3. zometa every 4 weeks started on 11/27/2015  INTERIM HISTORY: Sheryl [name] returns for follow up. She is accompanied by a translator today. She reports her head "feels heavy" and she has occasional headaches. Her appetite is okay. The patient was hospitalized recently for pneumonia, and reports she is much better now. She reports an nonproductive occasional cough now, with a dry throat. She denies any bladder of bowel concerns.   MEDICAL HISTORY:  Past Medical History:  Diagnosis Date  . Chills with fever    intermittently since d/c from hospital  . Dysuria-frequency syndrome    w/ pink urine  . GERD (gastroesophageal reflux disease)   . Hepatitis   . History of positive PPD    DX 2011--  CXR DONE NO EVIDENCE  . History of ureter stent   . Hydronephrosis, right   . Neuromuscular disorder (HCC)    legs numb intermittently  . [place]  [place]   . Pneumonia   . Right ureteral stone   . Urosepsis 8/14   ad[facility]    SURGICAL HISTORY: Past Surgical History:  Procedure Laterality Date  . CYSTOSCOPY W/ URETERAL STENT PLACEMENT Right 09/25/2012   Procedure: CYSTOSCOPY WITH RETROGRADE PYELOGRAM/URETERAL STENT PLACEMENT;  Surgeon: TAlexis Frock MD;  Location: [facility];  Service: Urology;  Laterality: Right;  . CYSTOSCOPY WITH RETROGRADE PYELOGRAM, URETEROSCOPY AND STENT PLACEMENT Right 10/15/2012   Procedure: CYSTOSCOPY WITH RETROGRADE PYELOGRAM, URETEROSCOPY AND REMOVAL STENT WITH  STENT PLACEMENT;  Surgeon: TAlexis Frock MD;  [facility]  Service: Urology;  Laterality: Right;  . ESOPHAGOGASTRODUODENOSCOPY (EGD) WITH PROPOFOL N/A 11/16/2014   Procedure: ESOPHAGOGASTRODUODENOSCOPY (EGD) WITH PROPOFOL;  Surgeon: DMilus Banister MD;  Location: [facility];  Service: Endoscopy;  Laterality: N/A;  . HOLMIUM LASER APPLICATION Right 92/95/6213  Procedure: HOLMIUM LASER APPLICATION;  Surgeon: TAlexis Frock MD;  [facility]  Service: Urology;  Laterality: Right;  . LIVER BIOPSY    . OTHER SURGICAL HISTORY Right    removal of ovarian cyst  . removal of uterine cyst     years  ago  . RIGHT VATS W/ DRAINAGE PEURAL EFFUSION AND BX'S  10-30-2008    SOCIAL HISTORY: Social History   Social History  . Marital status: Single    Spouse name: N/A  . Number of children: 3  . Years of education: N/A   Social History Main Topics  . Smoking status: Never Smoker  . Smokeless tobacco: Never Used  . Alcohol use No  . Drug use: No  . Sexual activity: Not Currently    Birth control/ protection: Abstinence   Other Topics Concern  . None   Social History Narrative  . None    FAMILY HISTORY: Family History  Problem Relation Age of Onset  . Stomach cancer Mother   . Lung disease Father   . Asthma Father     ALLERGIES:  has No Known Allergies.  MEDICATIONS:    Current Outpatient Prescriptions  Medication Sig Dispense Refill  . acetaminophen (TYLENOL) 325 MG tablet Take 2 tablets (650 mg total) by mouth every 6 (six) hours as needed for mild pain (or Fever >/= 101). 30 tablet 0  . acyclovir (ZOVIRAX) 800 MG tablet Take 1 tablet (800 mg total) by mouth 2 (two) times daily. 60 tablet 2  . entecavir (BARACLUDE) 0.5 MG tablet Take 0.5 mg by mouth daily.    . feeding supplement (BOOST / RESOURCE BREEZE) LIQD Take 1 Container by mouth 3 (three) times daily between meals. 237 mL 0  . lidocaine-prilocaine (EMLA) cream Apply 1 application topically as directed.  0  . magic mouthwash w/lidocaine SOLN Take 15 mLs by mouth 4 (four) times daily as needed for mouth pain. 150 mL 0  . metFORMIN (GLUCOPHAGE) 500 MG tablet Take 1 tablet (500 mg total) by mouth 2 (two) times daily with a meal. 60 tablet 1  . omeprazole (PRILOSEC) 20 MG capsule Take 1 capsule (20 mg total) by mouth daily. 30 capsule 3  . ondansetron (ZOFRAN) 8 MG tablet Take 1 tablet (8 mg total) by mouth every 8 (eight) hours as needed for nausea or vomiting. 30 tablet 1  . promethazine (PHENERGAN) 25 MG tablet Take 1 tablet (25 mg total) by mouth every 6 (six) hours as needed for nausea or vomiting. 30 tablet 0  . dexamethasone (DECADRON) 4 MG tablet Take 1 tablet (4 mg total) by mouth 2 (two) times daily with a meal. (Patient not taking: Reported on 05/26/2016) 30 tablet 1   No current facility-administered medications for this visit.    Facility-Administered Medications Ordered in Other Visits  Medication Dose Route Frequency Provider Last Rate Last Dose  . sodium chloride 0.9 % injection 10 mL  10 mL Intravenous PRN Truitt Merle, MD   10 mL at 12/11/15 1532  . sodium chloride flush (NS) 0.9 % injection 10 mL  10 mL Intravenous PRN Truitt Merle, MD   10 mL at 10/09/15 1717    REVIEW OF SYSTEMS:  Constitutional: no chills or abnormal night sweats (+) fatigued, occasional headaches Eyes: Denies blurriness  of vision, double vision or watery eyes Ears, nose, mouth, throat, and face: Denies mucositis (+) dry mouth/throat Respiratory: Denies, dyspnea or wheezes (+) nonproductive cough  Cardiovascular: Denies palpitation, chest discomfort or lower extremity swelling Gastrointestinal:  Denies nausea, heartburn, reports regular bowel/bladder Skin: Denies abnormal skin rashes Lymphatics: Denies new lymphadenopathy or easy bruising Neurological:Denies numbness, tingling or new weaknesses Behavioral/Psych: Mood is stable, no new changes  All other systems were reviewed with the patient and are negative.  PHYSICAL EXAMINATION:  ECOG PERFORMANCE STATUS: 2 Vitals:   05/26/16 0857  BP: 104/66  Pulse: 81  Resp: 18  Temp: 97.9 F (36.6 C)   GENERAL:alert, no distress and comfortable SKIN: skin color, texture, turgor are normal, no rashes or significant lesions EYES: normal, conjunctiva are pink and non-injected, sclera clear OROPHARYNX:no exudate, no erythema and lips, buccal mucosa NECK: supple, thyroid normal size, non-tender, without nodularity LYMPH:  no palpable lymphadenopathy in the cervical, axillary or inguinal LUNGS: clear to auscultation and percussion with normal breathing effort HEART: regular rate & rhythm and no murmurs and no lower extremity edema ABDOMEN:abdomen soft, mildly tender Musculoskeletal:no cyanosis of digits and no clubbing  PSYCH: alert & oriented x 3 with fluent speech NEURO: no focal motor/sensory deficits  LABORATORY DATA:  I have reviewed the data as listed CBC Latest Ref Rng & Units 05/26/2016 05/16/2016 05/15/2016  WBC 3.9 - 10.3 10e3/uL 3.3(L) 3.5(L) 6.3  Hemoglobin 11.6 - 15.9 g/dL 9.3(L) 10.4(L) 10.5(L)  Hematocrit 34.8 - 46.6 % 28.5(L) 32.6(L) 32.8(L)  Platelets 145 - 400 10e3/uL 145 41(L) 45(L)    CMP Latest Ref Rng & Units 05/26/2016 05/16/2016 05/14/2016  Glucose 70 - 140 mg/dl 101 98 85  BUN 7.0 - 26.0 mg/dL 9.'2 12 7  ' Creatinine 0.6 - 1.1 mg/dL 0.6  0.49 0.54  Sodium 136 - 145 mEq/L 147(H) 140 137  Potassium 3.5 - 5.1 mEq/L 3.3(L) 3.4(L) 3.5  Chloride 101 - 111 mmol/L - 109 110  CO2 22 - 29 mEq/L '25 26 24  ' Calcium 8.4 - 10.4 mg/dL 9.0 8.4(L) 7.9(L)  Total Protein 6.4 - 8.3 g/dL 5.8(L) - -  Total Bilirubin 0.20 - 1.20 mg/dL 0.23 - -  Alkaline Phos 40 - 150 U/L 132 - -  AST 5 - 34 U/L 38(H) - -  ALT 0 - 55 U/L 14 - -    SPEP M-protein  09/15/2014: 4.2 10/08/14: 4.6 12/08/2014: 0.2 02/15/2015: not sufficient sample for test   09/11/2015: not observed 10/09/2015: not det   11/06/2015: not det  12/25/2015: not det  02/13/2016: 0.3 03/12/2016: 0.8 04/09/2016: 0.6 05/08/16: 0.1   IgG mg/dl 11/03/2014: 3150  12/08/2014:  759 02/14/2014: 860 09/11/2015: 1347 10/09/2015: 1457 11/06/2015: 1504 12/25/2015: 1400 02/13/2016: 1543 03/12/2016: 1860 04/09/2016: 1620 05/08/16: 749  Kappa/lambda light chains levels and ration  11/03/14: 0.75, 152, 0.00 12/22/2014: 1.10, 2.60, 0.42 02/15/2015: 9.59,14.35, 0.67 09/11/2015: 2.44, 2.72, 0.90 10/09/2015: 3.09, 3.49, 0.89 11/06/2015: 2.30, 2.62, 0.88 12/25/2015: 2.51, 3.0, 0.84 02/13/2016: 2.64, 15.3, 0.17 03/12/2016: 2.27, 45.5, 0.05 04/09/2016: 3.33, 45.5, 0.07  24 h urine UPEP/IFE and light chain: 11/03/2014: IFE showed a monoclonal IgG heavy chain with associated lambda light chain. M protein was Undetectable   PATHOLOGY REPORT  Bone Marrow (BM) and Peripheral Blood (PB) FINAL PATHOLOGIC DIAGNOSIS  BONE MARROW: 07/11/2015 Hypocellular marrow (20%) with no increase in plasma cells (2%). See comment.  PERIPHERAL BLOOD: Mild anemia. No circulating plasma cells identified. See comment and CBC data.  BONE MARROW: 02/22/2016 Diagnosis Bone Marrow, Aspirate,Biopsy, and Clot, right iliac - NORMOCELLULAR BONE MARROW FOR AGE WITH TRILINEAGE HEMATOPOIESIS. - PLASMACYTOSIS (PLASMA CELLS 12%). - SEE COMMENT. PERIPHERAL BLOOD: - OCCASIONAL CIRCULATING PLASMA CELLS.  RADIOGRAPHIC STUDIES: I have personally reviewed  the radiological images as listed and agreed with the findings in the report. Dg Chest 2 View  Result Date: 05/11/2016 CLINICAL DATA:  Shortness of breath, wheezing EXAM: CHEST  2 VIEW COMPARISON:  05/08/2016 FINDINGS: Mild patchy bilateral lower lobe opacities, atelectasis versus pneumonia. No pleural effusion  or pneumothorax. The heart is top-normal in size. Right chest power port terminates at cavoatrial junction. IMPRESSION: Mild patchy bilateral lower lobe opacities, atelectasis versus pneumonia. Electronically Signed   By: Julian Hy M.D.   On: 05/11/2016 11:52   Dg Chest 2 View  Result Date: 05/08/2016 CLINICAL DATA:  Fever, cough and sore throat in patient on chemotherapy for leukemia. EXAM: CHEST  2 VIEW COMPARISON:  PA and lateral chest 11/13/2015. FINDINGS: Right IJ approach Port-A-Cath is unchanged. The lungs are clear. Heart size is normal. No pneumothorax or pleural effusion. No acute bony abnormality. IMPRESSION: No acute disease. Electronically Signed   By: Inge Rise M.D.   On: 05/08/2016 11:13   Dg Abd Portable 1v  Result Date: 05/15/2016 CLINICAL DATA:  Vomiting EXAM: PORTABLE ABDOMEN - 1 VIEW COMPARISON:  CT 05/20/2015 FINDINGS: Multiple stones noted in the lower pole of the left kidney, the largest 10 mm, stable since prior CT. No suspicious calcification over the right kidney or expected course of the ureters. Nonobstructive bowel gas pattern. No free air organomegaly. IMPRESSION: Left lower pole nephrolithiasis. No acute findings. Electronically Signed   By: Rolm Baptise M.D.   On: 05/15/2016 09:07     CT Biopsy 02/22/16 IMPRESSION: 1. Technically successful CT guided right iliac bone core and aspiration biopsy.  ASSESSMENT & PLAN: 46 y.o. Sheryl [name] woman, presented with anemia and leokocytosis   1. Acute plasma cell leukemia,  Relapse in 02/2016 -She had induction chemo with cyborD, and s/p ASCT on 04/04/2015 -Her repeated bone marrow biopsy on 07/10/2015 showed  hypercellular marrow, no increased plasma cells (2%), she has achieved a complete remission -We previously discussed bone marrow biopsy results from 02/22/2016. Unfortunately this showed increased plasma cells 12% with additional lambda light chain staining pending. This is relapse of disease. Cytogenetics was normal  -Her M protein and lambda free light chain level has significantly increased lately, consistent with disease relapse -Her recent LP showed negative CSF, no evidence of CNS involvement. -I have previously spoken with Dr. Norma Fredrickson, we agreed to change her CyBorD to weekly, and add daratumumab weekly, starting today. Potential side effects would discussed with patient, especially cytopenia and infusion reaction, she will take dexamethasone the day before Dara infusion.  -If her disease does not respond to treatment quickly, or if she has intolerance issue to cyBorD, I will change to pomalidomide,  dexamethasone and Dara  -continue acyclovir  -Continue Zometa every 4 weeks for a total of 2 years (until 11/2017) -lab reviewed, adequate for treatment. - Palates 145 on 05/26/16. - Due to recent hospitalization for severe cytopenia and infection, Cytoxan has been held.  -I will discuss with Dr. Wyline Copas about changing her treatment to Velcade, dexa and Dara. I am concerned that she will likely have severe cytopenia with pomalidomide,  dexamethasone and Dara  -will restart Velcade, and Dara in two day and continue weekly if she does well. Will restart weekly dexa in a few week if she continues to do well  - Patient asked about alcohol consumption. I advised the patient not to use alcohol, particularly while she is undergoing chemotherapy. I encouraged the patient to drink water instead, and to maintain adequate hydration.  2. Type 2 diabetes, steroids induced  -She was noticed to have increased blood glucose level lately, with random blood glucose above 200. She also developed diabetic symptoms.  No history of diabetes in the past. -This is likely steroids induced hyperglycemia -I strongly recommend her to avoid any soft drink  and sweats, and watch her carbohydrate intake, and exercise more  -She has been seen by her primary care physician. She was not given any medication to manage her glucose. I will call her primary care physician to discuss adding metformin. -I previously instructed her to take metformin every day since she is now taking dexamethasone more frequently.  -She restarted her dexamethasone weekly as part of his leukemia treatment, and she will also receive steroids as premedication, I recommend her to take metformin 500 mg twice daily continuously, to control her hyperglycemia. Potential side effects discussed, she agreed to proceed. - Glucose 101 on 05/26/16. Her sugar levels were previously high, likely due to the steroids. I previously advised her to take took [place] everyday and check her glucose levels at home. - When she sees her PCP again, I have previously advised her to get a meter from him and be instructed on how to use it  3. Chronic Hepatitis B  - She will follow-up GI clinic at [facility] -continue entecavir. Our nursing staff has previously called her physician's office to request this for her.  4. GERD, recent GI bleeding and nausea  -Continue omeprazole daily. We previously discussed steroids can worsen her acid reflux. -She had an EGD at [facility] in 2017.  -- she did not do her colonoscopy because she did not have anyone to accompany her during visit. They will call to reschedule.   5. Constipation -I previously encouraged the patient to try senca-s, ducalax 1-3 times a day or milk of magnesium if miralax doresn't help with constipation  - I previously advised the patient to take something everyday to help with constipation.   Plan  -  Labs reviewed today, adequate for treatment, she will restart Dara and Velcade in 2 days and continue weekly. Will  schedule Lab, flush, and Velcade and daratumamab in 2, 3, and 4 weeks on Wednesdays. - Hold Cytoxan and dexa for now  -I will see her weekly for the next few weeks .  All questions were answered. The patient knows to call the clinic with any problems, questions or concerns.  I spent 20 minutes counseling the patient face to face. We used the vedio interpreter service. The total time spent in the appointment was 25 minutes and more than 50% was on counseling.  This document serves as a record of services personally performed by Truitt Merle, MD. It was created on her behalf by Maryla Morrow, a trained medical scribe. The creation of this record is based on the scribe's personal observations and the provider's statements to them. This document has been checked and approved by the attending provider.  I have reviewed the above documentation for accuracy and completeness and I agree with the above.   Truitt Merle, MD 05/26/2016

## 2016-05-26 ENCOUNTER — Telehealth: Payer: Self-pay | Admitting: Hematology

## 2016-05-26 ENCOUNTER — Ambulatory Visit (HOSPITAL_BASED_OUTPATIENT_CLINIC_OR_DEPARTMENT_OTHER): Payer: BLUE CROSS/BLUE SHIELD | Admitting: Hematology

## 2016-05-26 ENCOUNTER — Ambulatory Visit: Payer: BLUE CROSS/BLUE SHIELD

## 2016-05-26 ENCOUNTER — Encounter: Payer: Self-pay | Admitting: Hematology

## 2016-05-26 ENCOUNTER — Other Ambulatory Visit (HOSPITAL_BASED_OUTPATIENT_CLINIC_OR_DEPARTMENT_OTHER): Payer: BLUE CROSS/BLUE SHIELD

## 2016-05-26 VITALS — BP 104/66 | HR 81 | Temp 97.9°F | Resp 18 | Ht 62.0 in | Wt 122.4 lb

## 2016-05-26 DIAGNOSIS — C9010 Plasma cell leukemia not having achieved remission: Secondary | ICD-10-CM

## 2016-05-26 DIAGNOSIS — K219 Gastro-esophageal reflux disease without esophagitis: Secondary | ICD-10-CM

## 2016-05-26 DIAGNOSIS — K922 Gastrointestinal hemorrhage, unspecified: Secondary | ICD-10-CM | POA: Diagnosis not present

## 2016-05-26 DIAGNOSIS — R5383 Other fatigue: Secondary | ICD-10-CM

## 2016-05-26 DIAGNOSIS — B1920 Unspecified viral hepatitis C without hepatic coma: Secondary | ICD-10-CM

## 2016-05-26 DIAGNOSIS — R630 Anorexia: Secondary | ICD-10-CM

## 2016-05-26 DIAGNOSIS — C9012 Plasma cell leukemia in relapse: Secondary | ICD-10-CM | POA: Diagnosis not present

## 2016-05-26 DIAGNOSIS — R110 Nausea: Secondary | ICD-10-CM

## 2016-05-26 DIAGNOSIS — R109 Unspecified abdominal pain: Secondary | ICD-10-CM

## 2016-05-26 DIAGNOSIS — R634 Abnormal weight loss: Secondary | ICD-10-CM

## 2016-05-26 DIAGNOSIS — Z95828 Presence of other vascular implants and grafts: Secondary | ICD-10-CM

## 2016-05-26 DIAGNOSIS — E099 Drug or chemical induced diabetes mellitus without complications: Secondary | ICD-10-CM

## 2016-05-26 DIAGNOSIS — K5900 Constipation, unspecified: Secondary | ICD-10-CM | POA: Diagnosis not present

## 2016-05-26 LAB — COMPREHENSIVE METABOLIC PANEL
ALBUMIN: 3.1 g/dL — AB (ref 3.5–5.0)
ALK PHOS: 132 U/L (ref 40–150)
ALT: 14 U/L (ref 0–55)
AST: 38 U/L — AB (ref 5–34)
Anion Gap: 10 mEq/L (ref 3–11)
BUN: 9.2 mg/dL (ref 7.0–26.0)
CO2: 25 meq/L (ref 22–29)
CREATININE: 0.6 mg/dL (ref 0.6–1.1)
Calcium: 9 mg/dL (ref 8.4–10.4)
Chloride: 112 mEq/L — ABNORMAL HIGH (ref 98–109)
Glucose: 101 mg/dl (ref 70–140)
Potassium: 3.3 mEq/L — ABNORMAL LOW (ref 3.5–5.1)
SODIUM: 147 meq/L — AB (ref 136–145)
TOTAL PROTEIN: 5.8 g/dL — AB (ref 6.4–8.3)
Total Bilirubin: 0.23 mg/dL (ref 0.20–1.20)

## 2016-05-26 LAB — CBC WITH DIFFERENTIAL/PLATELET
BASO%: 0.4 % (ref 0.0–2.0)
Basophils Absolute: 0 10*3/uL (ref 0.0–0.1)
EOS%: 0.4 % (ref 0.0–7.0)
Eosinophils Absolute: 0 10*3/uL (ref 0.0–0.5)
HCT: 28.5 % — ABNORMAL LOW (ref 34.8–46.6)
HEMOGLOBIN: 9.3 g/dL — AB (ref 11.6–15.9)
LYMPH%: 17.9 % (ref 14.0–49.7)
MCH: 28.6 pg (ref 25.1–34.0)
MCHC: 32.7 g/dL (ref 31.5–36.0)
MCV: 87.5 fL (ref 79.5–101.0)
MONO#: 0.4 10*3/uL (ref 0.1–0.9)
MONO%: 12.2 % (ref 0.0–14.0)
NEUT%: 69.1 % (ref 38.4–76.8)
NEUTROS ABS: 2.3 10*3/uL (ref 1.5–6.5)
Platelets: 145 10*3/uL (ref 145–400)
RBC: 3.25 10*6/uL — AB (ref 3.70–5.45)
RDW: 18.7 % — AB (ref 11.2–14.5)
WBC: 3.3 10*3/uL — AB (ref 3.9–10.3)
lymph#: 0.6 10*3/uL — ABNORMAL LOW (ref 0.9–3.3)

## 2016-05-26 MED ORDER — HEPARIN SOD (PORK) LOCK FLUSH 100 UNIT/ML IV SOLN
500.0000 [IU] | Freq: Once | INTRAVENOUS | Status: DC | PRN
  Filled 2016-05-26: qty 5

## 2016-05-26 MED ORDER — SODIUM CHLORIDE 0.9 % IJ SOLN
10.0000 mL | INTRAMUSCULAR | Status: DC | PRN
  Administered 2016-05-26: 10 mL via INTRAVENOUS
  Filled 2016-05-26: qty 10

## 2016-05-26 NOTE — Telephone Encounter (Signed)
Gave patient AVS and calender per 4/23 los. - unable to schedule f/u on 5/2 due to [place] availability message sent to MD. Patient aware.

## 2016-05-26 NOTE — Patient Instructions (Signed)
Implanted Port Home Guide An implanted port is a type of central line that is placed under the skin. Central lines are used to provide IV access when treatment or nutrition needs to be given through a person's veins. Implanted ports are used for long-term IV access. An implanted port may be placed because:  You need IV medicine that would be irritating to the small veins in your hands or arms.  You need long-term IV medicines, such as antibiotics.  You need IV nutrition for a long period.  You need frequent blood draws for lab tests.  You need dialysis.  Implanted ports are usually placed in the chest area, but they can also be placed in the upper arm, the abdomen, or the leg. An implanted port has two main parts:  Reservoir. The reservoir is round and will appear as a small, raised area under your skin. The reservoir is the part where a needle is inserted to give medicines or draw blood.  Catheter. The catheter is a thin, flexible tube that extends from the reservoir. The catheter is placed into a large vein. Medicine that is inserted into the reservoir goes into the catheter and then into the vein.  How will I care for my incision site? Do not get the incision site wet. Bathe or shower as directed by your health care provider. How is my port accessed? Special steps must be taken to access the port:  Before the port is accessed, a numbing cream can be placed on the skin. This helps numb the skin over the port site.  Your health care provider uses a sterile technique to access the port. ? Your health care provider must put on a mask and sterile gloves. ? The skin over your port is cleaned carefully with an antiseptic and allowed to dry. ? The port is gently pinched between sterile gloves, and a needle is inserted into the port.  Only "non-coring" port needles should be used to access the port. Once the port is accessed, a blood return should be checked. This helps ensure that the port  is in the vein and is not clogged.  If your port needs to remain accessed for a constant infusion, a clear (transparent) bandage will be placed over the needle site. The bandage and needle will need to be changed every week, or as directed by your health care provider.  Keep the bandage covering the needle clean and dry. Do not get it wet. Follow your health care provider's instructions on how to take a shower or bath while the port is accessed.  If your port does not need to stay accessed, no bandage is needed over the port.  What is flushing? Flushing helps keep the port from getting clogged. Follow your health care provider's instructions on how and when to flush the port. Ports are usually flushed with saline solution or a medicine called heparin. The need for flushing will depend on how the port is used.  If the port is used for intermittent medicines or blood draws, the port will need to be flushed: ? After medicines have been given. ? After blood has been drawn. ? As part of routine maintenance.  If a constant infusion is running, the port may not need to be flushed.  How long will my port stay implanted? The port can stay in for as long as your health care provider thinks it is needed. When it is time for the port to come out, surgery will be   done to remove it. The procedure is similar to the one performed when the port was put in. When should I seek immediate medical care? When you have an implanted port, you should seek immediate medical care if:  You notice a bad smell coming from the incision site.  You have swelling, redness, or drainage at the incision site.  You have more swelling or pain at the port site or the surrounding area.  You have a fever that is not controlled with medicine.  This information is not intended to replace advice given to you by your health care provider. Make sure you discuss any questions you have with your health care provider. Document  Released: 01/20/2005 Document Revised: 06/28/2015 Document Reviewed: 09/27/2012 Elsevier Interactive Patient Education  2017 [employer]  

## 2016-05-27 NOTE — Addendum Note (Signed)
Addended by: Truitt Merle on: 05/27/2016 08:42 AM   Modules accepted: Orders

## 2016-05-28 ENCOUNTER — Ambulatory Visit (HOSPITAL_BASED_OUTPATIENT_CLINIC_OR_DEPARTMENT_OTHER): Payer: BLUE CROSS/BLUE SHIELD

## 2016-05-28 VITALS — BP 132/70 | HR 56 | Temp 98.2°F | Resp 18

## 2016-05-28 DIAGNOSIS — C9010 Plasma cell leukemia not having achieved remission: Secondary | ICD-10-CM

## 2016-05-28 DIAGNOSIS — Z5112 Encounter for antineoplastic immunotherapy: Secondary | ICD-10-CM

## 2016-05-28 DIAGNOSIS — Z95828 Presence of other vascular implants and grafts: Secondary | ICD-10-CM

## 2016-05-28 MED ORDER — ZOLEDRONIC ACID 4 MG/100ML IV SOLN
4.0000 mg | Freq: Once | INTRAVENOUS | Status: AC
  Administered 2016-05-28: 4 mg via INTRAVENOUS
  Filled 2016-05-28: qty 100

## 2016-05-28 MED ORDER — ALTEPLASE 2 MG IJ SOLR
2.0000 mg | Freq: Once | INTRAMUSCULAR | Status: DC | PRN
  Filled 2016-05-28: qty 2

## 2016-05-28 MED ORDER — ACETAMINOPHEN 325 MG PO TABS
650.0000 mg | ORAL_TABLET | Freq: Once | ORAL | Status: AC
  Administered 2016-05-28: 650 mg via ORAL

## 2016-05-28 MED ORDER — ACETAMINOPHEN 325 MG PO TABS
ORAL_TABLET | ORAL | Status: AC
  Filled 2016-05-28: qty 2

## 2016-05-28 MED ORDER — BORTEZOMIB CHEMO SQ INJECTION 3.5 MG (2.5MG/ML)
1.5000 mg/m2 | Freq: Once | INTRAMUSCULAR | Status: AC
  Administered 2016-05-28: 2.5 mg via SUBCUTANEOUS
  Filled 2016-05-28: qty 2.5

## 2016-05-28 MED ORDER — METHYLPREDNISOLONE SODIUM SUCC 125 MG IJ SOLR
INTRAMUSCULAR | Status: AC
  Filled 2016-05-28: qty 2

## 2016-05-28 MED ORDER — PROCHLORPERAZINE MALEATE 10 MG PO TABS
ORAL_TABLET | ORAL | Status: AC
  Filled 2016-05-28: qty 1

## 2016-05-28 MED ORDER — SODIUM CHLORIDE 0.9 % IV SOLN
Freq: Once | INTRAVENOUS | Status: AC
  Administered 2016-05-28: 09:00:00 via INTRAVENOUS

## 2016-05-28 MED ORDER — SODIUM CHLORIDE 0.9% FLUSH
10.0000 mL | INTRAVENOUS | Status: DC | PRN
  Administered 2016-05-28: 10 mL
  Filled 2016-05-28: qty 10

## 2016-05-28 MED ORDER — METHYLPREDNISOLONE SODIUM SUCC 125 MG IJ SOLR
125.0000 mg | Freq: Once | INTRAMUSCULAR | Status: AC
  Administered 2016-05-28: 125 mg via INTRAVENOUS

## 2016-05-28 MED ORDER — SODIUM CHLORIDE 0.9 % IV SOLN
16.0000 mg/kg | Freq: Once | INTRAVENOUS | Status: AC
  Administered 2016-05-28: 900 mg via INTRAVENOUS
  Filled 2016-05-28: qty 40

## 2016-05-28 MED ORDER — DIPHENHYDRAMINE HCL 25 MG PO CAPS
ORAL_CAPSULE | ORAL | Status: AC
  Filled 2016-05-28: qty 2

## 2016-05-28 MED ORDER — HEPARIN SOD (PORK) LOCK FLUSH 100 UNIT/ML IV SOLN
500.0000 [IU] | Freq: Once | INTRAVENOUS | Status: AC | PRN
  Administered 2016-05-28: 500 [IU]
  Filled 2016-05-28: qty 5

## 2016-05-28 MED ORDER — PROCHLORPERAZINE MALEATE 10 MG PO TABS
10.0000 mg | ORAL_TABLET | Freq: Once | ORAL | Status: AC
  Administered 2016-05-28: 10 mg via ORAL

## 2016-05-28 MED ORDER — DIPHENHYDRAMINE HCL 25 MG PO CAPS
50.0000 mg | ORAL_CAPSULE | Freq: Once | ORAL | Status: AC
  Administered 2016-05-28: 50 mg via ORAL

## 2016-05-28 NOTE — Patient Instructions (Signed)
[facility] Discharge Instructions for Patients Receiving Chemotherapy  Today you received the following chemotherapy agents: Velcade and Darzalex.   To help prevent nausea and vomiting after your treatment, we encourage you to take your nausea medication as directed.   If you develop nausea and vomiting that is not controlled by your nausea medication, call the clinic.   BELOW ARE SYMPTOMS THAT SHOULD BE REPORTED IMMEDIATELY:  *FEVER GREATER THAN 100.5 F  *CHILLS WITH OR WITHOUT FEVER  NAUSEA AND VOMITING THAT IS NOT CONTROLLED WITH YOUR NAUSEA MEDICATION  *UNUSUAL SHORTNESS OF BREATH  *UNUSUAL BRUISING OR BLEEDING  TENDERNESS IN MOUTH AND THROAT WITH OR WITHOUT PRESENCE OF ULCERS  *URINARY PROBLEMS  *BOWEL PROBLEMS  UNUSUAL RASH Items with * indicate a potential emergency and should be followed up as soon as possible.  Feel free to call the clinic you have any questions or concerns. The clinic phone number is [phone].  Please show the CHEMO ALERT CARD at check-in to the Emergency Department and triage nurse.   

## 2016-06-03 NOTE — Progress Notes (Signed)
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      10/10/2014 Miscellaneous    Peripheral blood chemistry and leukocytosis with total white count 78K, comprised of large plasma cells and his normocytic anemia. There is a myeloid left shift with previous surgical radium blasts. Flow cytometry showed 64% plasma cells      10/10/2014 Bone Marrow Biopsy    Markedly hypercellular marrow (95%), Atypical plasma cells comprise 57% of the cellularity. There was diminished multilineage in hematopoiesis with adequate maturation. Breasts less than 1%), no overt dysplasia of the myeloid or erythroid lineages.       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      05/07/2015 - 05/12/2015 Hospital Admission    [facility], [facility]. ID workup was negative, EGD showed evidence of gastritis and duodenitis, no H. pylori or CMV.      05/20/2015 - 05/24/2015 Hospital Admission    [facility]      07/11/2015 Bone Marrow Biopsy    Post transplant 100 a bone marrow biopsy showed hypocellular marrow, 20%, no increase in plasma cells (2%) or other abnormalities.      08/22/2015 - 01/02/2016 Chemotherapy    MaintenanceCyborD (cytoxan 308mm2 iv, bortezomib 1.5 mg/m, dexamethasone 40 mg, every 2 weeks, changed to Velcade maintenance after 4 months treatment      01/16/2016 - 04/19/2016 Chemotherapy    Maintenance Velcade 1.3 mg/m every 2 weeks      02/22/2016 Pathology Results    BONE MARROW: Diagnosis Bone Marrow, Aspirate,Biopsy, and Clot, right iliac - NORMOCELLULAR BONE MARROW FOR AGE WITH TRILINEAGE HEMATOPOIESIS. - PLASMACYTOSIS (PLASMA CELLS 12%). - SEE COMMENT. PERIPHERAL BLOOD: - OCCASIONAL CIRCULATING PLASMA CELLS.      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 female is here because of recently diagnosed plasma cell leukemia. [facility] and is here to establish her local oncological care [place] is accompanied to  the clinic by her daughter and interpreter.  She presented to our hospital lth with worsening dyspnea, fatigue, cough, subjective fevers and chills, and was admitted on 09/14/2014. She was found to have allergy WBC 54.1K, hemoglobin 8.1, plt 310, she was treated with symptom management, and was discharged home on 09/17/2014, with a plan to follow-up with hematology. She represents to emergency room on  10/07/2014, was found to have white count of 78K, and worsening anemia with hemoglobin 6. She was seen by my partner Dr. Jonette Eva and plasma cell leukemia was suspected. She was transferred [facility] for further leukemia work-up and treatment. Bone marrow biopsy was done, which confirmed plasma cell leukemia, and she started chemotherapy with Velcade, Cytoxan and dexamethasone. She received weekly dose twice, last dose on 10/20/2014. She was discharged to home afterwards. She had a mediport placed during her hospitalization.  She has moderate fatigue, low appetie, she lost about 13 lbs in the past few months. She has moderate abdominal pain, 5-6/10, persistent, she does not take any pain meds.   I reviewed her medical records extensively, and discussed her case with Dr. Jorje Guild and [place] program coordinator.  CURRENT THERAPY:   1.Maintenance Velcade 1.65m/m2 every 2 weeks, started on 01/16/2016, changed back to CyBorD every 2 weeks on 02/27/2016, and changed to weekly on 04/18/2016 due to disease relapse, cytoxan held since 04/24/2016 due to cytopenia and infections  2. Daratumumab weekly started on 04/18/2016 3. zometa every 4 weeks started on 11/27/2015   INTERIM HISTORY:  Jeane returns for follow up. She is accompanied by a translator today. She is doing better overall lately. She denies any significant cough, dyspnea, or chest discomfort. No fever or chills. She has tolerated treatment well in the past 2 weeks, no other new complaints.    MEDICAL HISTORY:  Past Medical History:  Diagnosis Date  . Chills with fever    intermittently since d/c from hospital  . Dysuria-frequency syndrome    w/ pink urine  . GERD (gastroesophageal reflux disease)   . Hepatitis   . History of positive PPD    DX 2011--  CXR DONE NO EVIDENCE  . History of ureter stent   . Hydronephrosis, right   . Neuromuscular disorder (HCC)    legs numb intermittently  . 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   .  Pneumonia   . Right ureteral stone   . Urosepsis 8/14   ad[facility]    SURGICAL HISTORY: Past Surgical History:  Procedure Laterality Date  . CYSTOSCOPY W/ URETERAL STENT PLACEMENT Right 09/25/2012   Procedure: CYSTOSCOPY WITH RETROGRADE PYELOGRAM/URETERAL STENT PLACEMENT;  Surgeon: TAlexis Frock MD;  Location: [facility];  Service: Urology;  Laterality: Right;  . CYSTOSCOPY WITH RETROGRADE PYELOGRAM, URETEROSCOPY AND STENT PLACEMENT Right 10/15/2012   Procedure: CYSTOSCOPY WITH RETROGRADE PYELOGRAM, URETEROSCOPY AND REMOVAL STENT WITH  STENT PLACEMENT;  Surgeon: TAlexis Frock MD;  [facility]  Service: Urology;  Laterality: Right;  . ESOPHAGOGASTRODUODENOSCOPY (EGD) WITH PROPOFOL N/A 11/16/2014   Procedure: ESOPHAGOGASTRODUODENOSCOPY (EGD) WITH PROPOFOL;  Surgeon: DMilus Banister MD;  Location: [facility];  Service: Endoscopy;  Laterality: N/A;  . HOLMIUM LASER APPLICATION Right [id number]/9038  Procedure: HOLMIUM LASER APPLICATION;  Surgeon: TAlexis Frock MD;  [facility]  Service: [facility]rology;  Laterality: Right;  . LIVER BIOPSY    . OTHER SURGICAL HISTORY Right    removal of ovarian cyst  . removal of uterine cyst     years ago  .  RIGHT VATS W/ DRAINAGE PEURAL EFFUSION AND BX'S  10-30-2008    SOCIAL HISTORY: Social History   Social History  . Marital status: Single    Spouse name: N/A  . Number of children: 3  . Years of education: N/A   Social History Main Topics  . Smoking status: Never Smoker  . Smokeless tobacco: Never Used  . Alcohol use No  . Drug use: No  . Sexual activity: Not Currently    Birth control/ protection: Abstinence   Other Topics Concern  . Not on file   Social History Narrative  . No narrative on file    FAMILY HISTORY: Family History  Problem Relation Age of Onset  . Stomach cancer Mother   . Lung disease Father   . Asthma Father     ALLERGIES:  has No Known Allergies.  MEDICATIONS:   Current Outpatient Prescriptions  Medication Sig Dispense Refill  . acetaminophen (TYLENOL) 325 MG tablet Take 2 tablets (650 mg total) by mouth every 6 (six) hours as needed for mild pain (or Fever >/= 101). 30 tablet 0  . acyclovir (ZOVIRAX) 800 MG tablet Take 1 tablet (800 mg total) by mouth 2 (two) times daily. 60 tablet 2  . dexamethasone (DECADRON) 4 MG tablet Take 5 tablets (20 mg total) by mouth once a week. 30 tablet 2  . entecavir (BARACLUDE) 0.5 MG tablet Take 0.5 mg by mouth daily.    . feeding supplement (BOOST / RESOURCE BREEZE) LIQD Take 1 Container by mouth 3 (three) times daily between meals. 237 mL 0  . lidocaine-prilocaine (EMLA) cream Apply 1 application topically as directed.  0  . magic mouthwash w/lidocaine SOLN Take 15 mLs by mouth 4 (four) times daily as needed for mouth pain. 150 mL 0  . metFORMIN (GLUCOPHAGE) 500 MG tablet Take 1 tablet (500 mg total) by mouth 2 (two) times daily with a meal. 60 tablet 1  . omeprazole (PRILOSEC) 20 MG capsule Take 1 capsule (20 mg total) by mouth daily. 30 capsule 3  . ondansetron (ZOFRAN) 8 MG tablet Take 1 tablet (8 mg total) by mouth every 8 (eight) hours as needed for nausea or vomiting. 30 tablet 1  . promethazine (PHENERGAN) 25 MG tablet Take 1 tablet (25 mg total) by mouth every 6 (six) hours as needed for nausea or vomiting. 30 tablet 0   No current facility-administered medications for this visit.    Facility-Administered Medications Ordered in Other Visits  Medication Dose Route Frequency Provider Last Rate Last Dose  . sodium chloride 0.9 % injection 10 mL  10 mL Intravenous PRN Truitt Merle, MD   10 mL at 12/11/15 1532  . sodium chloride flush (NS) 0.9 % injection 10 mL  10 mL Intravenous PRN Truitt Merle, MD   10 mL at 10/09/15 1717    REVIEW OF SYSTEMS:  Constitutional: no chills or abnormal night sweats (+) fatigued, occasional headaches Eyes: Denies blurriness of vision, double vision or watery eyes Ears, nose, mouth,  throat, and face: Denies mucositis (+) dry mouth/throat Respiratory: Denies, dyspnea or wheezes (+) nonproductive cough  Cardiovascular: Denies palpitation, chest discomfort or lower extremity swelling Gastrointestinal:  Denies nausea, heartburn, reports regular bowel/bladder Skin: Denies abnormal skin rashes Lymphatics: Denies new lymphadenopathy or easy bruising Neurological:Denies numbness, tingling or new weaknesses Behavioral/Psych: Mood is stable, no new changes  All other systems were reviewed with the patient and are negative.  PHYSICAL EXAMINATION:   ECOG PERFORMANCE STATUS: 2 Blood pressure 112/45, heart rate  86, respiratory rate 16, temperature 36.7, pulse ox 100% GENERAL:alert, no distress and comfortable SKIN: skin color, texture, turgor are normal, no rashes or significant lesions EYES: normal, conjunctiva are pink and non-injected, sclera clear OROPHARYNX:no exudate, no erythema and lips, buccal mucosa NECK: supple, thyroid normal size, non-tender, without nodularity LYMPH:  no palpable lymphadenopathy in the cervical, axillary or inguinal LUNGS: clear to auscultation and percussion with normal breathing effort HEART: regular rate & rhythm and no murmurs and no lower extremity edema ABDOMEN:abdomen soft, mildly tender Musculoskeletal:no cyanosis of digits and no clubbing  PSYCH: alert & oriented x 3 with fluent speech NEURO: no focal motor/sensory deficits  LABORATORY DATA:  I have reviewed the data as listed CBC Latest Ref Rng & Units 06/04/2016 05/26/2016 05/16/2016  WBC 3.9 - 10.3 10e3/uL 4.3 3.3(L) 3.5(L)  Hemoglobin 11.6 - 15.9 g/dL 10.5(L) 9.3(L) 10.4(L)  Hematocrit 34.8 - 46.6 % 32.8(L) 28.5(L) 32.6(L)  Platelets 145 - 400 10e3/uL 171 145 41(L)    CMP Latest Ref Rng & Units 06/04/2016 05/26/2016 05/16/2016  Glucose 70 - 140 mg/dl 78 101 98  BUN 7.0 - 26.0 mg/dL 11.8 9.2 12  Creatinine 0.6 - 1.1 mg/dL 0.6 0.6 0.49  Sodium 136 - 145 mEq/L 142 147(H) 140  Potassium  3.5 - 5.1 mEq/L 4.1 3.3(L) 3.4(L)  Chloride 101 - 111 mmol/L - - 109  CO2 22 - 29 mEq/L '27 25 26  ' Calcium 8.4 - 10.4 mg/dL 9.9 9.0 8.4(L)  Total Protein 6.4 - 8.3 g/dL 6.8 5.8(L) -  Total Bilirubin 0.20 - 1.20 mg/dL 0.29 0.23 -  Alkaline Phos 40 - 150 U/L 136 132 -  AST 5 - 34 U/L 32 38(H) -  ALT 0 - 55 U/L 12 14 -    SPEP M-protein  09/15/2014: 4.2 10/08/14: 4.6 12/08/2014: 0.2 02/15/2015: not sufficient sample for test   09/11/2015: not observed 10/09/2015: not det   11/06/2015: not det  12/25/2015: not det  02/13/2016: 0.3 03/12/2016: 0.8 04/09/2016: 0.6 05/08/16: 0.1   IgG mg/dl 11/03/2014: 3150  12/08/2014:  759 02/14/2014: 860 09/11/2015: 1347 10/09/2015: 1457 11/06/2015: 1504 12/25/2015: 1400 02/13/2016: 1543 03/12/2016: 1860 04/09/2016: 1620 05/08/16: 749  Kappa/lambda light chains levels and ration  11/03/14: 0.75, 152, 0.00 12/22/2014: 1.10, 2.60, 0.42 02/15/2015: 9.59,14.35, 0.67 09/11/2015: 2.44, 2.72, 0.90 10/09/2015: 3.09, 3.49, 0.89 11/06/2015: 2.30, 2.62, 0.88 12/25/2015: 2.51, 3.0, 0.84 02/13/2016: 2.64, 15.3, 0.17 03/12/2016: 2.27, 45.5, 0.05 04/09/2016: 3.33, 45.5, 0.07 05/08/2016: 0.49, 1.21, 0.40  24 h urine UPEP/IFE and light chain: 11/03/2014: IFE showed a monoclonal IgG heavy chain with associated lambda light chain. M protein was Undetectable   PATHOLOGY REPORT  Bone Marrow (BM) and Peripheral Blood (PB) FINAL PATHOLOGIC DIAGNOSIS  BONE MARROW: 07/11/2015 Hypocellular marrow (20%) with no increase in plasma cells (2%). See comment.  PERIPHERAL BLOOD: Mild anemia. No circulating plasma cells identified. See comment and CBC data.  BONE MARROW: 02/22/2016 Diagnosis Bone Marrow, Aspirate,Biopsy, and Clot, right iliac - NORMOCELLULAR BONE MARROW FOR AGE WITH TRILINEAGE HEMATOPOIESIS. - PLASMACYTOSIS (PLASMA CELLS 12%). - SEE COMMENT. PERIPHERAL BLOOD: - OCCASIONAL CIRCULATING PLASMA CELLS.  RADIOGRAPHIC STUDIES: I have personally reviewed the radiological images as  listed and agreed with the findings in the report. Dg Chest 2 View  Result Date: 05/11/2016 CLINICAL DATA:  Shortness of breath, wheezing EXAM: CHEST  2 VIEW COMPARISON:  05/08/2016 FINDINGS: Mild patchy bilateral lower lobe opacities, atelectasis versus pneumonia. No pleural effusion or pneumothorax. The heart is top-normal in size. Right chest power port terminates  at cavoatrial junction. IMPRESSION: Mild patchy bilateral lower lobe opacities, atelectasis versus pneumonia. Electronically Signed   By: Julian Hy M.D.   On: 05/11/2016 11:52   Dg Chest 2 View  Result Date: 05/08/2016 CLINICAL DATA:  Fever, cough and sore throat in patient on chemotherapy for leukemia. EXAM: CHEST  2 VIEW COMPARISON:  PA and lateral chest 11/13/2015. FINDINGS: Right IJ approach Port-A-Cath is unchanged. The lungs are clear. Heart size is normal. No pneumothorax or pleural effusion. No acute bony abnormality. IMPRESSION: No acute disease. Electronically Signed   By: Inge Rise M.D.   On: 05/08/2016 11:13   Dg Abd Portable 1v  Result Date: 05/15/2016 CLINICAL DATA:  Vomiting EXAM: PORTABLE ABDOMEN - 1 VIEW COMPARISON:  CT 05/20/2015 FINDINGS: Multiple stones noted in the lower pole of the left kidney, the largest 10 mm, stable since prior CT. No suspicious calcification over the right kidney or expected course of the ureters. Nonobstructive bowel gas pattern. No free air organomegaly. IMPRESSION: Left lower pole nephrolithiasis. No acute findings. Electronically Signed   By: Rolm Baptise M.D.   On: 05/15/2016 09:07     CT Biopsy 02/22/16 IMPRESSION: 1. Technically successful CT guided right iliac bone core and aspiration biopsy.  ASSESSMENT & PLAN: 46 y.o. [place] woman, presented with anemia and leokocytosis   1. Acute plasma cell leukemia,  Relapse in 02/2016 -She had induction chemo with cyborD, and s/p ASCT on 04/04/2015 -Her repeated bone marrow biopsy on 07/10/2015 showed hypercellular marrow, no  increased plasma cells (2%), she has achieved a complete remission -We previously discussed bone marrow biopsy results from 02/22/2016. Unfortunately this showed increased plasma cells 12% with additional lambda light chain staining pending. This is relapse of disease. Cytogenetics was normal  -Her M protein and lambda free light chain level has significantly increased lately, consistent with disease relapse -Her recent LP showed negative CSF, no evidence of CNS involvement. -I have previously spoken with Dr. Norma Fredrickson, we agreed to change her CyBorD to weekly, and add daratumumab weekly, starting today. Potential side effects would discussed with patient, especially cytopenia and infusion reaction, she will take dexamethasone the day before Dara infusion.  -If her disease does not respond to treatment quickly, or if she has intolerance issue to cyBorD, I will change to pomalidomide,  dexamethasone and Dara  -continue acyclovir  -Continue Zometa every 4 weeks for a total of 2 years (until 11/2017) -lab previously reviewed, adequate for treatment. - Due to recent hospitalization for severe cytopenia and infection, Cytoxan has been held. -she has restarted Velcade, and Dara, tolerating well, will continue, will restart weekly dexa next week -Her M protein has significantly reduced since she started Dara, I have requested Dara specific immunofixation, to see if she has still measurable M protein. -Continue treatment, I'll see her back in 2 weeks.  2. Type 2 diabetes, steroids induced  -She was noticed to have increased blood glucose level lately, with random blood glucose above 200. She also developed diabetic symptoms. No history of diabetes in the past. -This is likely steroids induced hyperglycemia -I strongly recommend her to avoid any soft drink and sweats, and watch her carbohydrate intake, and exercise more  -She has been seen by her primary care physician. She was not given any medication to  manage her glucose. I will call her primary care physician to discuss adding metformin. -I previously instructed her to take metformin every day since she is now taking dexamethasone more frequently.  -She restarted her dexamethasone  weekly as part of his leukemia treatment, and she will also receive steroids as premedication, I recommend her to take metformin 500 mg twice daily continuously, to control her hyperglycemia. Potential side effects discussed, she agreed to proceed. -Continue close monitoring.  3. Chronic Hepatitis B  - She will follow-up GI clinic [facility] -continue entecavir. Our nursing staff has previously called her physician's office to request this for her.  4. GERD, recent GI bleeding and nausea  -Continue omeprazole daily. We previously discussed steroids can worsen her acid reflux. -She previously had an EGD at [facility] in 2017.  -- she did not do her colonoscopy because she did not have anyone to accompany her during visit. They will call to reschedule.   5. Constipation -I previously encouraged the patient to try senca-s, ducalax 1-3 times a day or milk of magnesium if miralax doresn't help with constipation  - I previously advised the patient to take something everyday to help with constipation.   Plan  -lab reviewed, continue Dara and velcade weekly, she will restart dexamethasone 20 mg weekly, the day before infusion -I'll see her back in 2 weeks -I have requested Dara specific SPEP/IFE to see if she has M-protein   All questions were answered. The patient knows to call the clinic with any problems, questions or concerns.  I spent 20 minutes counseling the patient face to face. We used the vedio interpreter service. The total time spent in the appointment was 25 minutes and more than 50% was on counseling.    Truitt Merle, MD 06/04/2016

## 2016-06-04 ENCOUNTER — Ambulatory Visit (HOSPITAL_BASED_OUTPATIENT_CLINIC_OR_DEPARTMENT_OTHER): Payer: Medicaid Other | Admitting: Hematology

## 2016-06-04 ENCOUNTER — Other Ambulatory Visit (HOSPITAL_BASED_OUTPATIENT_CLINIC_OR_DEPARTMENT_OTHER): Payer: Medicaid Other

## 2016-06-04 ENCOUNTER — Ambulatory Visit (HOSPITAL_BASED_OUTPATIENT_CLINIC_OR_DEPARTMENT_OTHER): Payer: Medicaid Other

## 2016-06-04 VITALS — BP 96/61 | HR 82 | Temp 98.0°F | Resp 17

## 2016-06-04 DIAGNOSIS — K922 Gastrointestinal hemorrhage, unspecified: Secondary | ICD-10-CM

## 2016-06-04 DIAGNOSIS — Z5112 Encounter for antineoplastic immunotherapy: Secondary | ICD-10-CM

## 2016-06-04 DIAGNOSIS — R110 Nausea: Secondary | ICD-10-CM | POA: Diagnosis not present

## 2016-06-04 DIAGNOSIS — R109 Unspecified abdominal pain: Secondary | ICD-10-CM | POA: Diagnosis not present

## 2016-06-04 DIAGNOSIS — K5900 Constipation, unspecified: Secondary | ICD-10-CM | POA: Diagnosis not present

## 2016-06-04 DIAGNOSIS — T380X5A Adverse effect of glucocorticoids and synthetic analogues, initial encounter: Secondary | ICD-10-CM | POA: Diagnosis not present

## 2016-06-04 DIAGNOSIS — C9012 Plasma cell leukemia in relapse: Secondary | ICD-10-CM

## 2016-06-04 DIAGNOSIS — B181 Chronic viral hepatitis B without delta-agent: Secondary | ICD-10-CM | POA: Diagnosis not present

## 2016-06-04 DIAGNOSIS — D649 Anemia, unspecified: Secondary | ICD-10-CM

## 2016-06-04 DIAGNOSIS — C9010 Plasma cell leukemia not having achieved remission: Secondary | ICD-10-CM

## 2016-06-04 DIAGNOSIS — R634 Abnormal weight loss: Secondary | ICD-10-CM | POA: Diagnosis not present

## 2016-06-04 DIAGNOSIS — E099 Drug or chemical induced diabetes mellitus without complications: Secondary | ICD-10-CM | POA: Diagnosis not present

## 2016-06-04 DIAGNOSIS — C9011 Plasma cell leukemia in remission: Secondary | ICD-10-CM

## 2016-06-04 DIAGNOSIS — K219 Gastro-esophageal reflux disease without esophagitis: Secondary | ICD-10-CM | POA: Diagnosis not present

## 2016-06-04 DIAGNOSIS — R5383 Other fatigue: Secondary | ICD-10-CM

## 2016-06-04 LAB — COMPREHENSIVE METABOLIC PANEL
ALT: 12 U/L (ref 0–55)
AST: 32 U/L (ref 5–34)
Albumin: 3.6 g/dL (ref 3.5–5.0)
Alkaline Phosphatase: 136 U/L (ref 40–150)
Anion Gap: 10 mEq/L (ref 3–11)
BUN: 11.8 mg/dL (ref 7.0–26.0)
CALCIUM: 9.9 mg/dL (ref 8.4–10.4)
CHLORIDE: 105 meq/L (ref 98–109)
CO2: 27 mEq/L (ref 22–29)
Creatinine: 0.6 mg/dL (ref 0.6–1.1)
EGFR: >90 mL/min/{1.73_m2} (ref >90)
Glucose: 78 mg/dl (ref 70–140)
POTASSIUM: 4.1 meq/L (ref 3.5–5.1)
SODIUM: 142 meq/L (ref 136–145)
Total Bilirubin: 0.29 mg/dL (ref 0.20–1.20)
Total Protein: 6.8 g/dL (ref 6.4–8.3)

## 2016-06-04 LAB — CBC WITH DIFFERENTIAL/PLATELET
BASO%: 0 % (ref 0.0–2.0)
Basophils Absolute: 0 10*3/uL (ref 0.0–0.1)
EOS%: 0.5 % (ref 0.0–7.0)
Eosinophils Absolute: 0 10*3/uL (ref 0.0–0.5)
HCT: 32.8 % — ABNORMAL LOW (ref 34.8–46.6)
HGB: 10.5 g/dL — ABNORMAL LOW (ref 11.6–15.9)
LYMPH%: 25.1 % (ref 14.0–49.7)
MCH: 28.8 pg (ref 25.1–34.0)
MCHC: 32 g/dL (ref 31.5–36.0)
MCV: 90.1 fL (ref 79.5–101.0)
MONO#: 1.1 10*3/uL — ABNORMAL HIGH (ref 0.1–0.9)
MONO%: 25.1 % — AB (ref 0.0–14.0)
NEUT#: 2.1 10*3/uL (ref 1.5–6.5)
NEUT%: 49.3 % (ref 38.4–76.8)
Platelets: 171 10*3/uL (ref 145–400)
RBC: 3.64 10*6/uL — ABNORMAL LOW (ref 3.70–5.45)
RDW: 17.3 % — ABNORMAL HIGH (ref 11.2–14.5)
WBC: 4.3 10*3/uL (ref 3.9–10.3)
lymph#: 1.1 10*3/uL (ref 0.9–3.3)
nRBC: 0 % (ref 0–0)

## 2016-06-04 MED ORDER — METHYLPREDNISOLONE SODIUM SUCC 125 MG IJ SOLR
125.0000 mg | Freq: Once | INTRAMUSCULAR | Status: AC
  Administered 2016-06-04: 125 mg via INTRAVENOUS

## 2016-06-04 MED ORDER — OMEPRAZOLE 20 MG PO CPDR: 20.0000 mg | DELAYED_RELEASE_CAPSULE | Freq: Every day | ORAL | 3 refills | Status: DC

## 2016-06-04 MED ORDER — SODIUM CHLORIDE 0.9 % IV SOLN
16.0000 mg/kg | Freq: Once | INTRAVENOUS | Status: AC
  Administered 2016-06-04: 900 mg via INTRAVENOUS
  Filled 2016-06-04: qty 40

## 2016-06-04 MED ORDER — SODIUM CHLORIDE 0.9% FLUSH
10.0000 mL | INTRAVENOUS | Status: DC | PRN
  Administered 2016-06-04: 10 mL
  Filled 2016-06-04: qty 10

## 2016-06-04 MED ORDER — ACETAMINOPHEN 325 MG PO TABS
650.0000 mg | ORAL_TABLET | Freq: Once | ORAL | Status: AC
  Administered 2016-06-04: 650 mg via ORAL

## 2016-06-04 MED ORDER — ACETAMINOPHEN 325 MG PO TABS
ORAL_TABLET | ORAL | Status: AC
  Filled 2016-06-04: qty 2

## 2016-06-04 MED ORDER — SODIUM CHLORIDE 0.9 % IV SOLN
Freq: Once | INTRAVENOUS | Status: AC
  Administered 2016-06-04: 10:00:00 via INTRAVENOUS

## 2016-06-04 MED ORDER — PROCHLORPERAZINE MALEATE 10 MG PO TABS
ORAL_TABLET | ORAL | Status: AC
  Filled 2016-06-04: qty 1

## 2016-06-04 MED ORDER — DEXAMETHASONE 4 MG PO TABS: 20.0000 mg | ORAL_TABLET | ORAL | 2 refills | Status: DC

## 2016-06-04 MED ORDER — PROCHLORPERAZINE MALEATE 10 MG PO TABS
10.0000 mg | ORAL_TABLET | Freq: Once | ORAL | Status: AC
  Administered 2016-06-04: 10 mg via ORAL

## 2016-06-04 MED ORDER — DIPHENHYDRAMINE HCL 25 MG PO CAPS
50.0000 mg | ORAL_CAPSULE | Freq: Once | ORAL | Status: AC
  Administered 2016-06-04: 50 mg via ORAL

## 2016-06-04 MED ORDER — HEPARIN SOD (PORK) LOCK FLUSH 100 UNIT/ML IV SOLN
500.0000 [IU] | Freq: Once | INTRAVENOUS | Status: AC | PRN
  Administered 2016-06-04: 500 [IU]
  Filled 2016-06-04: qty 5

## 2016-06-04 MED ORDER — METHYLPREDNISOLONE SODIUM SUCC 125 MG IJ SOLR
INTRAMUSCULAR | Status: AC
  Filled 2016-06-04: qty 2

## 2016-06-04 MED ORDER — DIPHENHYDRAMINE HCL 25 MG PO CAPS
ORAL_CAPSULE | ORAL | Status: AC
  Filled 2016-06-04: qty 2

## 2016-06-04 MED ORDER — BORTEZOMIB CHEMO SQ INJECTION 3.5 MG (2.5MG/ML)
1.5000 mg/m2 | Freq: Once | INTRAMUSCULAR | Status: AC
  Administered 2016-06-04: 2.5 mg via SUBCUTANEOUS
  Filled 2016-06-04: qty 2.5

## 2016-06-04 NOTE — Patient Instructions (Signed)
Cancer Center Discharge Instructions for Patients Receiving Chemotherapy  Today you received the following chemotherapy agents: Velcade and Darzalex.   To help prevent nausea and vomiting after your treatment, we encourage you to take your nausea medication as directed.   If you develop nausea and vomiting that is not controlled by your nausea medication, call the clinic.   BELOW ARE SYMPTOMS THAT SHOULD BE REPORTED IMMEDIATELY:  *FEVER GREATER THAN 100.5 F  *CHILLS WITH OR WITHOUT FEVER  NAUSEA AND VOMITING THAT IS NOT CONTROLLED WITH YOUR NAUSEA MEDICATION  *UNUSUAL SHORTNESS OF BREATH  *UNUSUAL BRUISING OR BLEEDING  TENDERNESS IN MOUTH AND THROAT WITH OR WITHOUT PRESENCE OF ULCERS  *URINARY PROBLEMS  *BOWEL PROBLEMS  UNUSUAL RASH Items with * indicate a potential emergency and should be followed up as soon as possible.  Feel free to call the clinic you have any questions or concerns. The clinic phone number is [phone].  Please show the CHEMO ALERT CARD at check-in to the Emergency Department and triage nurse.   

## 2016-06-05 LAB — KAPPA/LAMBDA LIGHT CHAINS
IG LAMBDA FREE LIGHT CHAIN: 11.1 mg/L (ref 5.7–26.3)
Ig Kappa Free Light Chain: 8.3 mg/L (ref 3.3–19.4)
KAPPA/LAMBDA FLC RATIO: 0.75 (ref 0.26–1.65)

## 2016-06-06 ENCOUNTER — Encounter: Payer: Self-pay | Admitting: Hematology

## 2016-06-09 NOTE — Progress Notes (Signed)
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      10/10/2014 Miscellaneous    Peripheral blood chemistry and leukocytosis with total white count 78K, comprised of large plasma cells and his normocytic anemia. There is a myeloid left shift with previous surgical radium blasts. Flow cytometry showed 64% plasma cells      10/10/2014 Bone Marrow Biopsy    Markedly hypercellular marrow (95%), Atypical plasma cells comprise 57% of the cellularity. There was diminished multilineage in hematopoiesis with adequate maturation. Breasts less than 1%), no overt dysplasia of the myeloid or erythroid lineages.       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      05/07/2015 - 05/12/2015 Hospital Admission    [facility], [facility]. ID workup was negative, EGD showed evidence of gastritis and duodenitis, no H. pylori or CMV.      05/20/2015 - 05/24/2015 Hospital Admission    [facility]      07/11/2015 Bone Marrow Biopsy    Post transplant 100 a bone marrow biopsy showed hypocellular marrow, 20%, no increase in plasma cells (2%) or other abnormalities.      08/22/2015 - 01/02/2016 Chemotherapy    MaintenanceCyborD (cytoxan 3037mm2 iv, bortezomib 1.5 mg/m, dexamethasone 40 mg, every 2 weeks, changed to Velcade maintenance after 4 months treatment      01/16/2016 - 04/19/2016 Chemotherapy    Maintenance Velcade 1.3 mg/m every 2 weeks      02/22/2016 Pathology Results    BONE MARROW: Diagnosis Bone Marrow, Aspirate,Biopsy, and Clot, right iliac - NORMOCELLULAR BONE MARROW FOR AGE WITH TRILINEAGE HEMATOPOIESIS. - PLASMACYTOSIS (PLASMA CELLS 12%). - SEE COMMENT. PERIPHERAL BLOOD: - OCCASIONAL CIRCULATING PLASMA CELLS.      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 female is here because of recently diagnosed plasma cell leukemia. [facility] and is here to establish her local oncological care [place] is accompanied to  the clinic by her daughter and interpreter.  She presented to our hospital lth with worsening dyspnea, fatigue, cough, subjective fevers and chills, and was admitted on 09/14/2014. She was found to have allergy WBC 54.1K, hemoglobin 8.1, plt 310, she was treated with symptom management, and was discharged home on 09/17/2014, with a plan to follow-up with hematology. She represents to emergency room  on 10/07/2014, was found to have white count of 78K, and worsening anemia with hemoglobin 6. She was seen by my partner Dr. Jonette Eva and plasma cell leukemia was suspected. She was transferred to [facility] for further leukemia work-up and treatment. Bone marrow biopsy was done, which confirmed plasma cell leukemia, and she started chemotherapy with Velcade, Cytoxan and dexamethasone. She received weekly dose twice, last dose on 10/20/2014. She was discharged to home afterwards. She had a mediport placed during her hospitalization.  She has moderate fatigue, low appetie, she lost about 13 lbs in the past few months. She has moderate abdominal pain, 5-6/10, persistent, she does not take any pain meds.   I reviewed her medical records extensively, and discussed her case with Dr. Jorje Guild and [place] program coordinator.  CURRENT THERAPY:   1.Maintenance Velcade 1.54m/m2 every 2 weeks, started on 01/16/2016, changed back to CyBorD every 2 weeks on 02/27/2016, and changed to weekly on 04/18/2016 due to disease relapse, cytoxan held since 04/24/2016 due to cytopenia and infections  2. Daratumumab weekly started on 04/18/2016 3. zometa every 4 weeks started on 11/27/2015   INTERIM HISTORY:  Vasilisa returns for follow up. She is accompanied by a translator today. She reports her eyes are not in pain but tired. She denies swelling. She did take 5 pill of her steroid medication. She reports her stomach gets bloated especially with spicy foods. In the morning she feels fine. She energy feels well. Her body feels a little warm like a fever but then goes back to normal but no fever is reached. No she does not have an appointment to see Dr. RDoneen Poisson He was supposed to call her.     MEDICAL HISTORY:  Past Medical History:  Diagnosis Date  . Chills with fever    intermittently since d/c from hospital  . Dysuria-frequency syndrome    w/ pink urine  . GERD (gastroesophageal reflux disease)   . Hepatitis   .  History of positive PPD    DX 2011--  CXR DONE NO EVIDENCE  . History of ureter stent   . Hydronephrosis, right   . Neuromuscular disorder (HCC)    legs numb intermittently  . 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[place]   . Pneumonia   . Right ureteral stone   . Urosepsis 8/14   ad[facility] to [facility]    SURGICAL HISTORY: Past Surgical History:  Procedure Laterality Date  . CYSTOSCOPY W/ URETERAL STENT PLACEMENT Right 09/25/2012   Procedure: CYSTOSCOPY WITH RETROGRADE PYELOGRAM/URETERAL STENT PLACEMENT;  Surgeon: TAlexis Frock MD;  Location: [facility];  Service: Urology;  Laterality: Right;  . CYSTOSCOPY WITH RETROGRADE PYELOGRAM, URETEROSCOPY AND STENT PLACEMENT Right 10/15/2012   Procedure: CYSTOSCOPY WITH RETROGRADE PYELOGRAM, URETEROSCOPY AND REMOVAL STENT WITH  STENT PLACEMENT;  Surgeon: TAlexis Frock MD;  [facility]  Service: Urology;  Laterality: Right;  . ESOPHAGOGASTRODUODENOSCOPY (EGD) WITH PROPOFOL N/A 11/16/2014   Procedure: ESOPHAGOGASTRODUODENOSCOPY (EGD) WITH PROPOFOL;  Surgeon: DMilus Banister MD;  Location: [facility];  Service: Endoscopy;  Laterality: N/A;  . HOLMIUM LASER APPLICATION Right 93/34/3568  Procedure: HOLMIUM LASER APPLICATION;  Surgeon: Alexis Frock, MD;  [facility];  Service: Urology;  Laterality: Right;  . LIVER BIOPSY    . OTHER SURGICAL HISTORY Right    removal of ovarian cyst  . removal of uterine cyst     years ago  . RIGHT VATS W/ DRAINAGE PEURAL EFFUSION AND BX'S  10-30-2008    SOCIAL HISTORY: Social History   Social History  . Marital status: Single    Spouse name: N/A  . Number of children: 3  . Years of education: N/A   Social History Main Topics  . Smoking status: Never Smoker  . Smokeless tobacco: Never Used  . Alcohol use No  . Drug use: No  . Sexual activity: Not Currently    Birth control/ protection: Abstinence   Other Topics Concern  . None   Social History Narrative  . None     FAMILY HISTORY: Family History  Problem Relation Age of Onset  . Stomach cancer Mother   . Lung disease Father   . Asthma Father     ALLERGIES:  has No Known Allergies.  MEDICATIONS:  Current Outpatient Prescriptions  Medication Sig Dispense Refill  . acetaminophen (TYLENOL) 325 MG tablet Take 2 tablets (650 mg total) by mouth every 6 (six) hours as needed for mild pain (or Fever >/= 101). 30 tablet 0  . acyclovir (ZOVIRAX) 800 MG tablet Take 1 tablet (800 mg total) by mouth 2 (two) times daily. 60 tablet 2  . dexamethasone (DECADRON) 4 MG tablet Take 5 tablets (20 mg total) by mouth once a week. 30 tablet 2  . entecavir (BARACLUDE) 0.5 MG tablet Take 0.5 mg by mouth daily.    . feeding supplement (BOOST / RESOURCE BREEZE) LIQD Take 1 Container by mouth 3 (three) times daily between meals. 237 mL 0  . lidocaine-prilocaine (EMLA) cream Apply 1 application topically as directed.  0  . magic mouthwash w/lidocaine SOLN Take 15 mLs by mouth 4 (four) times daily as needed for mouth pain. 150 mL 0  . metFORMIN (GLUCOPHAGE) 500 MG tablet Take 1 tablet (500 mg total) by mouth 2 (two) times daily with a meal. 60 tablet 1  . omeprazole (PRILOSEC) 20 MG capsule Take 1 capsule (20 mg total) by mouth daily. 30 capsule 3  . ondansetron (ZOFRAN) 8 MG tablet Take 1 tablet (8 mg total) by mouth every 8 (eight) hours as needed for nausea or vomiting. 30 tablet 1  . promethazine (PHENERGAN) 25 MG tablet Take 1 tablet (25 mg total) by mouth every 6 (six) hours as needed for nausea or vomiting. 30 tablet 0   No current facility-administered medications for this visit.    Facility-Administered Medications Ordered in Other Visits  Medication Dose Route Frequency Provider Last Rate Last Dose  . sodium chloride 0.9 % injection 10 mL  10 mL Intravenous PRN Truitt Merle, MD   10 mL at 12/11/15 1532  . sodium chloride 0.9 % injection 10 mL  10 mL Intravenous PRN Truitt Merle, MD   10 mL at 06/11/16 1505  . sodium  chloride flush (NS) 0.9 % injection 10 mL  10 mL Intravenous PRN Truitt Merle, MD   10 mL at 10/09/15 1717    REVIEW OF SYSTEMS:  Constitutional: no chills or abnormal night sweats (+) fatigued, occasional headaches Eyes: Denies blurriness of vision, double vision or watery eyes (+) eyes feel tired Ears, nose, mouth, throat, and face: Denies mucositis (+) dry mouth/throat Respiratory: Denies, dyspnea or wheezes (+)  nonproductive cough  Cardiovascular: Denies palpitation, chest discomfort or lower extremity swelling Gastrointestinal:  Denies nausea, heartburn, reports regular bowel/bladder Skin: Denies abnormal skin rashes Lymphatics: Denies new lymphadenopathy or easy bruising Neurological:Denies numbness, tingling or new weaknesses Behavioral/Psych: Mood is stable, no new changes  All other systems were reviewed with the patient and are negative.  PHYSICAL EXAMINATION:   ECOG PERFORMANCE STATUS: 1  Vitals:   06/11/16 0916  BP: 110/78  Pulse: 88  Resp: 18  Temp: 97.8 F (36.6 C)  TempSrc: Oral  SpO2: 100%  Weight: 122 lb 1.6 oz (55.4 kg)  Height: _0  (1.575 m)     GENERAL:alert, no distress and comfortable SKIN: skin color, texture, turgor are normal, no rashes or significant lesions EYES: normal, conjunctiva are pink and non-injected, sclera clear OROPHARYNX:no exudate, no erythema and lips, buccal mucosa NECK: supple, thyroid normal size, non-tender, without nodularity LYMPH:  no palpable lymphadenopathy in the cervical, axillary or inguinal LUNGS: clear to auscultation and percussion with normal breathing effort HEART: regular rate & rhythm and no murmurs and no lower extremity edema ABDOMEN:abdomen soft, mildly tender Musculoskeletal:no cyanosis of digits and no clubbing  PSYCH: alert & oriented x 3 with fluent speech NEURO: no focal motor/sensory deficits  LABORATORY DATA:  I have reviewed the data as listed CBC Latest Ref Rng & Units 06/11/2016 06/04/2016 05/26/2016    WBC 3.9 - 10.3 10e3/uL 2.9(L) 4.3 3.3(L)  Hemoglobin 11.6 - 15.9 g/dL 11.3(L) 10.5(L) 9.3(L)  Hematocrit 34.8 - 46.6 % 34.4(L) 32.8(L) 28.5(L)  Platelets 145 - 400 10e3/uL 188 171 145    CMP Latest Ref Rng & Units 06/11/2016 06/04/2016 06/04/2016  Glucose 70 - 140 mg/dl 166(H) 78 -  BUN 7.0 - 26.0 mg/dL 15.1 11.8 -  Creatinine 0.6 - 1.1 mg/dL 0.7 0.6 -  Sodium 136 - 145 mEq/L 141 142 -  Potassium 3.5 - 5.1 mEq/L 4.1 4.1 -  Chloride 101 - 111 mmol/L - - -  CO2 22 - 29 mEq/L 22 27 -  Calcium 8.4 - 10.4 mg/dL 9.5 9.9 -  Total Protein 6.4 - 8.3 g/dL 7.2 6.8 6.1  Total Bilirubin 0.20 - 1.20 mg/dL 0.31 0.29 -  Alkaline Phos 40 - 150 U/L 136 136 -  AST 5 - 34 U/L 41(H) 32 -  ALT 0 - 55 U/L 19 12 -   ANC 2.2K today   SPEP M-protein  09/15/2014: 4.2 10/08/14: 4.6 12/08/2014: 0.2 02/15/2015: not sufficient sample for test   09/11/2015: not observed 10/09/2015: not det   11/06/2015: not det  12/25/2015: not det  02/13/2016: 0.3 03/12/2016: 0.8 04/09/2016: 0.6 05/08/16: 0.1   IgG mg/dl 11/03/2014: 3150  12/08/2014:  759 02/14/2014: 860 09/11/2015: 1347 10/09/2015: 1457 11/06/2015: 1504 12/25/2015: 1400 02/13/2016: 1543 03/12/2016: 1860 04/09/2016: 1620 05/08/16: 749  Kappa/lambda light chains levels and ration  11/03/14: 0.75, 152, 0.00 12/22/2014: 1.10, 2.60, 0.42 02/15/2015: 9.59,14.35, 0.67 09/11/2015: 2.44, 2.72, 0.90 10/09/2015: 3.09, 3.49, 0.89 11/06/2015: 2.30, 2.62, 0.88 12/25/2015: 2.51, 3.0, 0.84 02/13/2016: 2.64, 15.3, 0.17 03/12/2016: 2.27, 45.5, 0.05 04/09/2016: 3.33, 45.5, 0.07 05/08/2016: 0.49, 1.21, 0.40 06/04/2016: 0.83, 1.11, 0.75  24 h urine UPEP/IFE and light chain: 11/03/2014: IFE showed a monoclonal IgG heavy chain with associated lambda light chain. M protein was Undetectable   PATHOLOGY REPORT  Bone Marrow (BM) and Peripheral Blood (PB) FINAL PATHOLOGIC DIAGNOSIS  BONE MARROW: 07/11/2015 Hypocellular marrow (20%) with no increase in plasma cells (2%). See comment.  PERIPHERAL  BLOOD: Mild anemia. No circulating plasma cells  identified. See comment and CBC data.  BONE MARROW: 02/22/2016 Diagnosis Bone Marrow, Aspirate,Biopsy, and Clot, right iliac - NORMOCELLULAR BONE MARROW FOR AGE WITH TRILINEAGE HEMATOPOIESIS. - PLASMACYTOSIS (PLASMA CELLS 12%). - SEE COMMENT. PERIPHERAL BLOOD: - OCCASIONAL CIRCULATING PLASMA CELLS.  RADIOGRAPHIC STUDIES: I have personally reviewed the radiological images as listed and agreed with the findings in the report. Dg Abd Portable 1v  Result Date: 05/15/2016 CLINICAL DATA:  Vomiting EXAM: PORTABLE ABDOMEN - 1 VIEW COMPARISON:  CT 05/20/2015 FINDINGS: Multiple stones noted in the lower pole of the left kidney, the largest 10 mm, stable since prior CT. No suspicious calcification over the right kidney or expected course of the ureters. Nonobstructive bowel gas pattern. No free air organomegaly. IMPRESSION: Left lower pole nephrolithiasis. No acute findings. Electronically Signed   By: Rolm Baptise M.D.   On: 05/15/2016 09:07     CT Biopsy 02/22/16 IMPRESSION: 1. Technically successful CT guided right iliac bone core and aspiration biopsy.  ASSESSMENT & PLAN: 46 y.o. [place] woman, presented with anemia and leokocytosis   1. Acute plasma cell leukemia,  Relapse in 02/2016 -She had induction chemo with cyborD, and s/p ASCT on 04/04/2015 -Her repeated bone marrow biopsy on 07/10/2015 showed hypercellular marrow, no increased plasma cells (2%), she has achieved a complete remission -We previously discussed bone marrow biopsy results from 02/22/2016. Unfortunately this showed increased plasma cells 12% with additional lambda light chain staining pending. This is relapse of disease. Cytogenetics was normal  -Her M protein and lambda free light chain level has significantly increased lately, consistent with disease relapse -Her recent LP showed negative CSF, no evidence of CNS involvement. -I have previously spoken with Dr. Norma Fredrickson, we  agreed to change her CyBorD to weekly, and add daratumumab weekly, starting today. Potential side effects would discussed with patient, especially cytopenia and infusion reaction, she will take dexamethasone the day before Dara infusion.  -If her disease does not respond to treatment quickly, or if she has intolerance issue to cyBorD, I will change to pomalidomide,  dexamethasone and Dara  -continue acyclovir  -Continue Zometa every 4 weeks for a total of 2 years (until 11/2017) -lab previously reviewed, adequate for treatment. - Due to recent hospitalization for severe cytopenia and infection, Cytoxan has been held. -she has restarted Velcade, and Dara, tolerating well, will continue, weekly dexa restarted this week -Her M protein has significantly reduced since she started Dara, I have requested Dara specific immunofixation, to see if she has still measurable M protein. -Today labs reviewed and adequate to continue treatment   -After next 2 Dara infusion  treatments, her infusion treatment will be every 2 weeks, continue velcade injection weekly -If her SPEP/IFE is negative for M-protein, will consider repeating bone marrow biopsy to confirm if she is in remission again   2. Type 2 diabetes, steroids induced  -She was noticed to have increased blood glucose level lately, with random blood glucose above 200. She also developed diabetic symptoms. No history of diabetes in the past. -This is likely steroids induced hyperglycemia -I strongly recommend her to avoid any soft drink and sweats, and watch her carbohydrate intake, and exercise more  -She has been seen by her primary care physician. She was not given any medication to manage her glucose. I will call her primary care physician to discuss adding metformin. -I previously instructed her to take metformin every day since she is now taking dexamethasone more frequently.  -She restarted her dexamethasone weekly as part of his  leukemia treatment,  and she will also receive steroids as premedication, I recommend her to take metformin 500 mg twice daily continuously, to control her hyperglycemia.Potential side effects discussed, she agreed to proceed. -Continue close monitoring. -We discussed restarting to metformin one pill a day while on steroids to see if her levels will stabilize. I encourage her to take it in the morning with breakfast.   3. Chronic Hepatitis B  - She will follow-up GI clinic at [name] -continue entecavir. Our nursing staff has previously called her physician's office to request this for her.  4. GERD, recent GI bleeding and nausea  -Continue omeprazole daily. We previously discussed steroids can worsen her acid reflux. -She previously had an EGD at [facility] in 2017.  -- she did not do her colonoscopy because she did not have anyone to accompany her during visit. They will call to reschedule.   5. Constipation -I previously encouraged the patient to try senca-s, ducalax 1-3 times a day or milk of magnesium if miralax doresn't help with constipation  - I previously advised the patient to take something everyday to help with constipation.   Plan  -lab reviewed, will proceed velcade and dara today and continue weekly, will changed Dara to every 2 weeks after two more treatments  -zometa next on 5/23 -f/u in 4 weeks    All questions were answered. The patient knows to call the clinic with any problems, questions or concerns.  I spent 20 minutes counseling the patient face to face. We used the vedio interpreter service. The total time spent in the appointment was 25 minutes and more than 50% was on counseling.  This document serves as a record of services personally performed by Truitt Merle, MD. It was created on her behalf by Joslyn Devon, a trained medical scribe. The creation of this record is based on the scribe's personal observations and the provider's statements to them. This document has been checked and  approved by the attending provider.    Truitt Merle, MD 06/11/2016

## 2016-06-11 ENCOUNTER — Ambulatory Visit (HOSPITAL_BASED_OUTPATIENT_CLINIC_OR_DEPARTMENT_OTHER): Payer: Medicaid Other | Admitting: Hematology

## 2016-06-11 ENCOUNTER — Ambulatory Visit: Payer: BLUE CROSS/BLUE SHIELD

## 2016-06-11 ENCOUNTER — Encounter: Payer: Self-pay | Admitting: Hematology

## 2016-06-11 ENCOUNTER — Ambulatory Visit (HOSPITAL_BASED_OUTPATIENT_CLINIC_OR_DEPARTMENT_OTHER): Payer: Medicaid Other

## 2016-06-11 ENCOUNTER — Other Ambulatory Visit (HOSPITAL_BASED_OUTPATIENT_CLINIC_OR_DEPARTMENT_OTHER): Payer: Medicaid Other

## 2016-06-11 VITALS — BP 104/75 | HR 83 | Temp 98.2°F | Resp 18

## 2016-06-11 VITALS — BP 110/78 | HR 88 | Temp 97.8°F | Resp 18 | Ht 62.0 in | Wt 122.1 lb

## 2016-06-11 DIAGNOSIS — T380X5A Adverse effect of glucocorticoids and synthetic analogues, initial encounter: Secondary | ICD-10-CM | POA: Diagnosis not present

## 2016-06-11 DIAGNOSIS — R634 Abnormal weight loss: Secondary | ICD-10-CM | POA: Diagnosis not present

## 2016-06-11 DIAGNOSIS — C9010 Plasma cell leukemia not having achieved remission: Secondary | ICD-10-CM

## 2016-06-11 DIAGNOSIS — R109 Unspecified abdominal pain: Secondary | ICD-10-CM

## 2016-06-11 DIAGNOSIS — Z5112 Encounter for antineoplastic immunotherapy: Secondary | ICD-10-CM

## 2016-06-11 DIAGNOSIS — E099 Drug or chemical induced diabetes mellitus without complications: Secondary | ICD-10-CM | POA: Diagnosis not present

## 2016-06-11 DIAGNOSIS — R630 Anorexia: Secondary | ICD-10-CM

## 2016-06-11 DIAGNOSIS — C9012 Plasma cell leukemia in relapse: Secondary | ICD-10-CM

## 2016-06-11 DIAGNOSIS — B181 Chronic viral hepatitis B without delta-agent: Secondary | ICD-10-CM | POA: Diagnosis not present

## 2016-06-11 DIAGNOSIS — D649 Anemia, unspecified: Secondary | ICD-10-CM | POA: Diagnosis not present

## 2016-06-11 DIAGNOSIS — R5383 Other fatigue: Secondary | ICD-10-CM

## 2016-06-11 DIAGNOSIS — K5900 Constipation, unspecified: Secondary | ICD-10-CM

## 2016-06-11 DIAGNOSIS — Z95828 Presence of other vascular implants and grafts: Secondary | ICD-10-CM

## 2016-06-11 DIAGNOSIS — K219 Gastro-esophageal reflux disease without esophagitis: Secondary | ICD-10-CM | POA: Diagnosis not present

## 2016-06-11 LAB — CBC WITH DIFFERENTIAL/PLATELET
BASO%: 0.1 % (ref 0.0–2.0)
BASOS ABS: 0 10*3/uL (ref 0.0–0.1)
EOS ABS: 0 10*3/uL (ref 0.0–0.5)
EOS%: 0 % (ref 0.0–7.0)
HCT: 34.4 % — ABNORMAL LOW (ref 34.8–46.6)
HEMOGLOBIN: 11.3 g/dL — AB (ref 11.6–15.9)
LYMPH%: 23 % (ref 14.0–49.7)
MCH: 29.6 pg (ref 25.1–34.0)
MCHC: 32.9 g/dL (ref 31.5–36.0)
MCV: 89.8 fL (ref 79.5–101.0)
MONO#: 0 10*3/uL — ABNORMAL LOW (ref 0.1–0.9)
MONO%: 1.5 % (ref 0.0–14.0)
NEUT#: 2.2 10*3/uL (ref 1.5–6.5)
NEUT%: 75.4 % (ref 38.4–76.8)
Platelets: 188 10*3/uL (ref 145–400)
RBC: 3.83 10*6/uL (ref 3.70–5.45)
RDW: 17.7 % — AB (ref 11.2–14.5)
WBC: 2.9 10*3/uL — ABNORMAL LOW (ref 3.9–10.3)
lymph#: 0.7 10*3/uL — ABNORMAL LOW (ref 0.9–3.3)

## 2016-06-11 LAB — COMPREHENSIVE METABOLIC PANEL
ALT: 19 U/L (ref 0–55)
ANION GAP: 10 meq/L (ref 3–11)
AST: 41 U/L — AB (ref 5–34)
Albumin: 3.8 g/dL (ref 3.5–5.0)
Alkaline Phosphatase: 136 U/L (ref 40–150)
BILIRUBIN TOTAL: 0.31 mg/dL (ref 0.20–1.20)
BUN: 15.1 mg/dL (ref 7.0–26.0)
CO2: 22 meq/L (ref 22–29)
CREATININE: 0.7 mg/dL (ref 0.6–1.1)
Calcium: 9.5 mg/dL (ref 8.4–10.4)
Chloride: 108 mEq/L (ref 98–109)
EGFR: >90 mL/min/{1.73_m2} (ref >90)
GLUCOSE: 166 mg/dL — AB (ref 70–140)
Potassium: 4.1 mEq/L (ref 3.5–5.1)
Sodium: 141 mEq/L (ref 136–145)
TOTAL PROTEIN: 7.2 g/dL (ref 6.4–8.3)

## 2016-06-11 LAB — MULTIPLE MYELOMA PANEL, SERUM
ALBUMIN SERPL ELPH-MCNC: 3.4 g/dL (ref 2.9–4.4)
ALPHA 1: 0.2 g/dL (ref 0.0–0.4)
Albumin/Glob SerPl: 1.3 (ref 0.7–1.7)
Alpha2 Glob SerPl Elph-Mcnc: 0.9 g/dL (ref 0.4–1.0)
B-Globulin SerPl Elph-Mcnc: 0.9 g/dL (ref 0.7–1.3)
Gamma Glob SerPl Elph-Mcnc: 0.6 g/dL (ref 0.4–1.8)
Globulin, Total: 2.7 g/dL (ref 2.2–3.9)
IGA/IMMUNOGLOBULIN A, SERUM: 26 mg/dL — AB (ref 87–352)
IgG, Qn, Serum: 751 mg/dL (ref 700–1600)
IgM, Qn, Serum: 44 mg/dL (ref 26–217)
M Protein SerPl Elph-Mcnc: 0.1 g/dL — ABNORMAL HIGH
TOTAL PROTEIN: 6.1 g/dL (ref 6.0–8.5)

## 2016-06-11 MED ORDER — DIPHENHYDRAMINE HCL 25 MG PO CAPS
50.0000 mg | ORAL_CAPSULE | Freq: Once | ORAL | Status: AC
  Administered 2016-06-11: 50 mg via ORAL

## 2016-06-11 MED ORDER — METHYLPREDNISOLONE SODIUM SUCC 125 MG IJ SOLR
INTRAMUSCULAR | Status: AC
  Filled 2016-06-11: qty 2

## 2016-06-11 MED ORDER — HEPARIN SOD (PORK) LOCK FLUSH 100 UNIT/ML IV SOLN
500.0000 [IU] | Freq: Once | INTRAVENOUS | Status: AC | PRN
  Administered 2016-06-11: 500 [IU]
  Filled 2016-06-11: qty 5

## 2016-06-11 MED ORDER — ACETAMINOPHEN 325 MG PO TABS
ORAL_TABLET | ORAL | Status: AC
  Filled 2016-06-11: qty 2

## 2016-06-11 MED ORDER — SODIUM CHLORIDE 0.9 % IV SOLN
16.0000 mg/kg | Freq: Once | INTRAVENOUS | Status: AC
  Administered 2016-06-11: 900 mg via INTRAVENOUS
  Filled 2016-06-11: qty 40

## 2016-06-11 MED ORDER — DIPHENHYDRAMINE HCL 25 MG PO CAPS
ORAL_CAPSULE | ORAL | Status: AC
  Filled 2016-06-11: qty 2

## 2016-06-11 MED ORDER — BORTEZOMIB CHEMO SQ INJECTION 3.5 MG (2.5MG/ML)
1.5000 mg/m2 | Freq: Once | INTRAMUSCULAR | Status: AC
  Administered 2016-06-11: 2.5 mg via SUBCUTANEOUS
  Filled 2016-06-11: qty 2.5

## 2016-06-11 MED ORDER — SODIUM CHLORIDE 0.9 % IJ SOLN
10.0000 mL | INTRAMUSCULAR | Status: AC | PRN
  Administered 2016-06-11 (×2): 10 mL via INTRAVENOUS
  Filled 2016-06-11: qty 10

## 2016-06-11 MED ORDER — PROCHLORPERAZINE MALEATE 10 MG PO TABS
10.0000 mg | ORAL_TABLET | Freq: Once | ORAL | Status: AC
  Administered 2016-06-11: 10 mg via ORAL

## 2016-06-11 MED ORDER — ACETAMINOPHEN 325 MG PO TABS
650.0000 mg | ORAL_TABLET | Freq: Once | ORAL | Status: AC
  Administered 2016-06-11: 650 mg via ORAL

## 2016-06-11 MED ORDER — SODIUM CHLORIDE 0.9% FLUSH
10.0000 mL | INTRAVENOUS | Status: DC | PRN
  Filled 2016-06-11: qty 10

## 2016-06-11 MED ORDER — METHYLPREDNISOLONE SODIUM SUCC 125 MG IJ SOLR
125.0000 mg | Freq: Once | INTRAMUSCULAR | Status: AC
  Administered 2016-06-11: 125 mg via INTRAVENOUS

## 2016-06-11 MED ORDER — PROCHLORPERAZINE MALEATE 10 MG PO TABS
ORAL_TABLET | ORAL | Status: AC
  Filled 2016-06-11: qty 1

## 2016-06-11 MED ORDER — SODIUM CHLORIDE 0.9 % IV SOLN
Freq: Once | INTRAVENOUS | Status: AC
  Administered 2016-06-11: 10:00:00 via INTRAVENOUS

## 2016-06-11 NOTE — Patient Instructions (Signed)
Implanted Port Home Guide An implanted port is a type of central line that is placed under the skin. Central lines are used to provide IV access when treatment or nutrition needs to be given through a person's veins. Implanted ports are used for long-term IV access. An implanted port may be placed because:  You need IV medicine that would be irritating to the small veins in your hands or arms.  You need long-term IV medicines, such as antibiotics.  You need IV nutrition for a long period.  You need frequent blood draws for lab tests.  You need dialysis.  Implanted ports are usually placed in the chest area, but they can also be placed in the upper arm, the abdomen, or the leg. An implanted port has two main parts:  Reservoir. The reservoir is round and will appear as a small, raised area under your skin. The reservoir is the part where a needle is inserted to give medicines or draw blood.  Catheter. The catheter is a thin, flexible tube that extends from the reservoir. The catheter is placed into a large vein. Medicine that is inserted into the reservoir goes into the catheter and then into the vein.  How will I care for my incision site? Do not get the incision site wet. Bathe or shower as directed by your health care provider. How is my port accessed? Special steps must be taken to access the port:  Before the port is accessed, a numbing cream can be placed on the skin. This helps numb the skin over the port site.  Your health care provider uses a sterile technique to access the port. ? Your health care provider must put on a mask and sterile gloves. ? The skin over your port is cleaned carefully with an antiseptic and allowed to dry. ? The port is gently pinched between sterile gloves, and a needle is inserted into the port.  Only "non-coring" port needles should be used to access the port. Once the port is accessed, a blood return should be checked. This helps ensure that the port  is in the vein and is not clogged.  If your port needs to remain accessed for a constant infusion, a clear (transparent) bandage will be placed over the needle site. The bandage and needle will need to be changed every week, or as directed by your health care provider.  Keep the bandage covering the needle clean and dry. Do not get it wet. Follow your health care provider's instructions on how to take a shower or bath while the port is accessed.  If your port does not need to stay accessed, no bandage is needed over the port.  What is flushing? Flushing helps keep the port from getting clogged. Follow your health care provider's instructions on how and when to flush the port. Ports are usually flushed with saline solution or a medicine called heparin. The need for flushing will depend on how the port is used.  If the port is used for intermittent medicines or blood draws, the port will need to be flushed: ? After medicines have been given. ? After blood has been drawn. ? As part of routine maintenance.  If a constant infusion is running, the port may not need to be flushed.  How long will my port stay implanted? The port can stay in for as long as your health care provider thinks it is needed. When it is time for the port to come out, surgery will be   done to remove it. The procedure is similar to the one performed when the port was put in. When should I seek immediate medical care? When you have an implanted port, you should seek immediate medical care if:  You notice a bad smell coming from the incision site.  You have swelling, redness, or drainage at the incision site.  You have more swelling or pain at the port site or the surrounding area.  You have a fever that is not controlled with medicine.  This information is not intended to replace advice given to you by your health care provider. Make sure you discuss any questions you have with your health care provider. Document  Released: 01/20/2005 Document Revised: 06/28/2015 Document Reviewed: 09/27/2012 Elsevier Interactive Patient Education  2017 [employer]  

## 2016-06-11 NOTE — Patient Instructions (Signed)
[facility] Discharge Instructions for Patients Receiving Chemotherapy  Today you received the following chemotherapy agents: Velcade and Darzalex.   To help prevent nausea and vomiting after your treatment, we encourage you to take your nausea medication as directed.   If you develop nausea and vomiting that is not controlled by your nausea medication, call the clinic.   BELOW ARE SYMPTOMS THAT SHOULD BE REPORTED IMMEDIATELY:  *FEVER GREATER THAN 100.5 F  *CHILLS WITH OR WITHOUT FEVER  NAUSEA AND VOMITING THAT IS NOT CONTROLLED WITH YOUR NAUSEA MEDICATION  *UNUSUAL SHORTNESS OF BREATH  *UNUSUAL BRUISING OR BLEEDING  TENDERNESS IN MOUTH AND THROAT WITH OR WITHOUT PRESENCE OF ULCERS  *URINARY PROBLEMS  *BOWEL PROBLEMS  UNUSUAL RASH Items with * indicate a potential emergency and should be followed up as soon as possible.  Feel free to call the clinic you have any questions or concerns. The clinic phone number is [phone].  Please show the CHEMO ALERT CARD at check-in to the Emergency Department and triage nurse.   

## 2016-06-18 ENCOUNTER — Ambulatory Visit (HOSPITAL_BASED_OUTPATIENT_CLINIC_OR_DEPARTMENT_OTHER): Payer: Medicaid Other

## 2016-06-18 ENCOUNTER — Other Ambulatory Visit (HOSPITAL_BASED_OUTPATIENT_CLINIC_OR_DEPARTMENT_OTHER): Payer: Medicaid Other

## 2016-06-18 VITALS — BP 101/57 | HR 72 | Temp 97.5°F | Resp 16

## 2016-06-18 DIAGNOSIS — Z5112 Encounter for antineoplastic immunotherapy: Secondary | ICD-10-CM | POA: Diagnosis not present

## 2016-06-18 DIAGNOSIS — C9012 Plasma cell leukemia in relapse: Secondary | ICD-10-CM

## 2016-06-18 DIAGNOSIS — C9010 Plasma cell leukemia not having achieved remission: Secondary | ICD-10-CM

## 2016-06-18 LAB — CBC WITH DIFFERENTIAL/PLATELET
BASO%: 0.1 % (ref 0.0–2.0)
BASOS ABS: 0 10*3/uL (ref 0.0–0.1)
EOS ABS: 0 10*3/uL (ref 0.0–0.5)
EOS%: 0 % (ref 0.0–7.0)
HEMATOCRIT: 34.3 % — AB (ref 34.8–46.6)
HEMOGLOBIN: 11.3 g/dL — AB (ref 11.6–15.9)
LYMPH#: 0.5 10*3/uL — AB (ref 0.9–3.3)
LYMPH%: 19 % (ref 14.0–49.7)
MCH: 29.7 pg (ref 25.1–34.0)
MCHC: 32.9 g/dL (ref 31.5–36.0)
MCV: 90.3 fL (ref 79.5–101.0)
MONO#: 0 10*3/uL — ABNORMAL LOW (ref 0.1–0.9)
MONO%: 1.5 % (ref 0.0–14.0)
NEUT%: 79.4 % — ABNORMAL HIGH (ref 38.4–76.8)
NEUTROS ABS: 2.1 10*3/uL (ref 1.5–6.5)
PLATELETS: 174 10*3/uL (ref 145–400)
RBC: 3.8 10*6/uL (ref 3.70–5.45)
RDW: 17.9 % — AB (ref 11.2–14.5)
WBC: 2.7 10*3/uL — AB (ref 3.9–10.3)

## 2016-06-18 LAB — COMPREHENSIVE METABOLIC PANEL
ALBUMIN: 3.8 g/dL (ref 3.5–5.0)
ALK PHOS: 117 U/L (ref 40–150)
ALT: 14 U/L (ref 0–55)
AST: 30 U/L (ref 5–34)
Anion Gap: 7 mEq/L (ref 3–11)
BILIRUBIN TOTAL: 0.25 mg/dL (ref 0.20–1.20)
BUN: 13.9 mg/dL (ref 7.0–26.0)
CALCIUM: 9.3 mg/dL (ref 8.4–10.4)
CO2: 24 mEq/L (ref 22–29)
Chloride: 108 mEq/L (ref 98–109)
Creatinine: 0.7 mg/dL (ref 0.6–1.1)
GLUCOSE: 157 mg/dL — AB (ref 70–140)
Potassium: 4.2 mEq/L (ref 3.5–5.1)
SODIUM: 140 meq/L (ref 136–145)
Total Protein: 7.2 g/dL (ref 6.4–8.3)

## 2016-06-18 MED ORDER — METHYLPREDNISOLONE SODIUM SUCC 125 MG IJ SOLR
INTRAMUSCULAR | Status: AC
  Filled 2016-06-18: qty 2

## 2016-06-18 MED ORDER — METHYLPREDNISOLONE SODIUM SUCC 125 MG IJ SOLR
125.0000 mg | Freq: Once | INTRAMUSCULAR | Status: AC
  Administered 2016-06-18: 125 mg via INTRAVENOUS

## 2016-06-18 MED ORDER — SODIUM CHLORIDE 0.9% FLUSH
10.0000 mL | INTRAVENOUS | Status: DC | PRN
  Administered 2016-06-18: 10 mL
  Filled 2016-06-18: qty 10

## 2016-06-18 MED ORDER — SODIUM CHLORIDE 0.9 % IV SOLN
16.0000 mg/kg | Freq: Once | INTRAVENOUS | Status: AC
  Administered 2016-06-18: 900 mg via INTRAVENOUS
  Filled 2016-06-18: qty 5

## 2016-06-18 MED ORDER — HEPARIN SOD (PORK) LOCK FLUSH 100 UNIT/ML IV SOLN
500.0000 [IU] | Freq: Once | INTRAVENOUS | Status: AC | PRN
  Administered 2016-06-18: 500 [IU]
  Filled 2016-06-18: qty 5

## 2016-06-18 MED ORDER — ACETAMINOPHEN 325 MG PO TABS
650.0000 mg | ORAL_TABLET | Freq: Once | ORAL | Status: AC
  Administered 2016-06-18: 650 mg via ORAL

## 2016-06-18 MED ORDER — BORTEZOMIB CHEMO SQ INJECTION 3.5 MG (2.5MG/ML)
1.5000 mg/m2 | Freq: Once | INTRAMUSCULAR | Status: AC
  Administered 2016-06-18: 2.5 mg via SUBCUTANEOUS
  Filled 2016-06-18: qty 2.5

## 2016-06-18 MED ORDER — SODIUM CHLORIDE 0.9 % IV SOLN
Freq: Once | INTRAVENOUS | Status: AC
  Administered 2016-06-18: 09:00:00 via INTRAVENOUS

## 2016-06-18 MED ORDER — PROCHLORPERAZINE MALEATE 10 MG PO TABS
10.0000 mg | ORAL_TABLET | Freq: Once | ORAL | Status: AC
  Administered 2016-06-18: 10 mg via ORAL

## 2016-06-18 MED ORDER — PROCHLORPERAZINE MALEATE 10 MG PO TABS
ORAL_TABLET | ORAL | Status: AC
  Filled 2016-06-18: qty 1

## 2016-06-18 MED ORDER — ACETAMINOPHEN 325 MG PO TABS
ORAL_TABLET | ORAL | Status: AC
  Filled 2016-06-18: qty 2

## 2016-06-18 MED ORDER — DIPHENHYDRAMINE HCL 25 MG PO CAPS
50.0000 mg | ORAL_CAPSULE | Freq: Once | ORAL | Status: AC
  Administered 2016-06-18: 50 mg via ORAL

## 2016-06-18 MED ORDER — DIPHENHYDRAMINE HCL 25 MG PO CAPS
ORAL_CAPSULE | ORAL | Status: AC
  Filled 2016-06-18: qty 2

## 2016-06-18 NOTE — Patient Instructions (Signed)
[facility] Discharge Instructions for Patients Receiving Chemotherapy  Today you received the following chemotherapy agents Darzalex and Velcade   To help prevent nausea and vomiting after your treatment, we encourage you to take your nausea medication as directed.    If you develop nausea and vomiting that is not controlled by your nausea medication, call the clinic.   BELOW ARE SYMPTOMS THAT SHOULD BE REPORTED IMMEDIATELY:  *FEVER GREATER THAN 100.5 F  *CHILLS WITH OR WITHOUT FEVER  NAUSEA AND VOMITING THAT IS NOT CONTROLLED WITH YOUR NAUSEA MEDICATION  *UNUSUAL SHORTNESS OF BREATH  *UNUSUAL BRUISING OR BLEEDING  TENDERNESS IN MOUTH AND THROAT WITH OR WITHOUT PRESENCE OF ULCERS  *URINARY PROBLEMS  *BOWEL PROBLEMS  UNUSUAL RASH Items with * indicate a potential emergency and should be followed up as soon as possible.  Feel free to call the clinic you have any questions or concerns. The clinic phone number is [phone].  Please show the CHEMO ALERT CARD at check-in to the Emergency Department and triage nurse.   

## 2016-06-25 ENCOUNTER — Ambulatory Visit (HOSPITAL_BASED_OUTPATIENT_CLINIC_OR_DEPARTMENT_OTHER): Payer: Medicaid Other

## 2016-06-25 ENCOUNTER — Other Ambulatory Visit (HOSPITAL_BASED_OUTPATIENT_CLINIC_OR_DEPARTMENT_OTHER): Payer: Medicaid Other

## 2016-06-25 ENCOUNTER — Ambulatory Visit: Payer: BLUE CROSS/BLUE SHIELD

## 2016-06-25 VITALS — BP 99/76 | HR 84 | Temp 98.2°F | Resp 16

## 2016-06-25 DIAGNOSIS — C9010 Plasma cell leukemia not having achieved remission: Secondary | ICD-10-CM

## 2016-06-25 DIAGNOSIS — C9012 Plasma cell leukemia in relapse: Secondary | ICD-10-CM | POA: Diagnosis present

## 2016-06-25 DIAGNOSIS — Z5112 Encounter for antineoplastic immunotherapy: Secondary | ICD-10-CM | POA: Diagnosis not present

## 2016-06-25 DIAGNOSIS — Z95828 Presence of other vascular implants and grafts: Secondary | ICD-10-CM

## 2016-06-25 LAB — CBC WITH DIFFERENTIAL/PLATELET
BASO%: 0 % (ref 0.0–2.0)
BASOS ABS: 0 10*3/uL (ref 0.0–0.1)
EOS%: 0 % (ref 0.0–7.0)
Eosinophils Absolute: 0 10*3/uL (ref 0.0–0.5)
HEMATOCRIT: 34.8 % (ref 34.8–46.6)
HGB: 11 g/dL — ABNORMAL LOW (ref 11.6–15.9)
LYMPH#: 0.4 10*3/uL — AB (ref 0.9–3.3)
LYMPH%: 15.8 % (ref 14.0–49.7)
MCH: 28.9 pg (ref 25.1–34.0)
MCHC: 31.6 g/dL (ref 31.5–36.0)
MCV: 91.3 fL (ref 79.5–101.0)
MONO#: 0 10*3/uL — ABNORMAL LOW (ref 0.1–0.9)
MONO%: 1.6 % (ref 0.0–14.0)
NEUT#: 2 10*3/uL (ref 1.5–6.5)
NEUT%: 82.6 % — AB (ref 38.4–76.8)
Platelets: 158 10*3/uL (ref 145–400)
RBC: 3.81 10*6/uL (ref 3.70–5.45)
RDW: 14.9 % — ABNORMAL HIGH (ref 11.2–14.5)
WBC: 2.5 10*3/uL — ABNORMAL LOW (ref 3.9–10.3)

## 2016-06-25 LAB — COMPREHENSIVE METABOLIC PANEL
ALT: 14 U/L (ref 0–55)
ANION GAP: 10 meq/L (ref 3–11)
AST: 32 U/L (ref 5–34)
Albumin: 3.8 g/dL (ref 3.5–5.0)
Alkaline Phosphatase: 114 U/L (ref 40–150)
BUN: 12.5 mg/dL (ref 7.0–26.0)
CALCIUM: 9.3 mg/dL (ref 8.4–10.4)
CHLORIDE: 109 meq/L (ref 98–109)
CO2: 22 meq/L (ref 22–29)
Creatinine: 0.6 mg/dL (ref 0.6–1.1)
EGFR: >90 mL/min/{1.73_m2} (ref >90)
Glucose: 138 mg/dl (ref 70–140)
POTASSIUM: 3.9 meq/L (ref 3.5–5.1)
Sodium: 141 mEq/L (ref 136–145)
Total Bilirubin: 0.24 mg/dL (ref 0.20–1.20)
Total Protein: 7 g/dL (ref 6.4–8.3)

## 2016-06-25 MED ORDER — PROCHLORPERAZINE MALEATE 10 MG PO TABS
ORAL_TABLET | ORAL | Status: AC
Start: 2016-06-25 — End: 2016-06-25
  Filled 2016-06-25: qty 1

## 2016-06-25 MED ORDER — PROCHLORPERAZINE MALEATE 10 MG PO TABS
10.0000 mg | ORAL_TABLET | Freq: Once | ORAL | Status: AC
Start: 2016-06-25 — End: 2016-06-25
  Administered 2016-06-25: 10 mg via ORAL

## 2016-06-25 MED ORDER — METHYLPREDNISOLONE SODIUM SUCC 125 MG IJ SOLR
125.0000 mg | Freq: Once | INTRAMUSCULAR | Status: AC
  Administered 2016-06-25: 125 mg via INTRAVENOUS

## 2016-06-25 MED ORDER — SODIUM CHLORIDE 0.9% FLUSH
10.0000 mL | INTRAVENOUS | Status: DC | PRN
  Administered 2016-06-25: 10 mL
  Filled 2016-06-25: qty 10

## 2016-06-25 MED ORDER — SODIUM CHLORIDE 0.9 % IV SOLN
16.0000 mg/kg | Freq: Once | INTRAVENOUS | Status: AC
  Administered 2016-06-25: 900 mg via INTRAVENOUS
  Filled 2016-06-25: qty 5

## 2016-06-25 MED ORDER — DIPHENHYDRAMINE HCL 25 MG PO CAPS
50.0000 mg | ORAL_CAPSULE | Freq: Once | ORAL | Status: AC
  Administered 2016-06-25: 50 mg via ORAL

## 2016-06-25 MED ORDER — HEPARIN SOD (PORK) LOCK FLUSH 100 UNIT/ML IV SOLN
500.0000 [IU] | Freq: Once | INTRAVENOUS | Status: AC | PRN
  Administered 2016-06-25: 500 [IU]
  Filled 2016-06-25: qty 5

## 2016-06-25 MED ORDER — ACETAMINOPHEN 325 MG PO TABS
ORAL_TABLET | ORAL | Status: AC
  Filled 2016-06-25: qty 2

## 2016-06-25 MED ORDER — SODIUM CHLORIDE 0.9 % IJ SOLN
10.0000 mL | INTRAMUSCULAR | Status: DC | PRN
  Administered 2016-06-25: 10 mL via INTRAVENOUS
  Filled 2016-06-25: qty 10

## 2016-06-25 MED ORDER — DIPHENHYDRAMINE HCL 25 MG PO CAPS
ORAL_CAPSULE | ORAL | Status: AC
Start: 2016-06-25 — End: 2016-06-25
  Filled 2016-06-25: qty 2

## 2016-06-25 MED ORDER — ZOLEDRONIC ACID 4 MG/100ML IV SOLN
4.0000 mg | Freq: Once | INTRAVENOUS | Status: AC
  Administered 2016-06-25: 4 mg via INTRAVENOUS
  Filled 2016-06-25: qty 100

## 2016-06-25 MED ORDER — METHYLPREDNISOLONE SODIUM SUCC 125 MG IJ SOLR
INTRAMUSCULAR | Status: AC
Start: 2016-06-25 — End: 2016-06-25
  Filled 2016-06-25: qty 2

## 2016-06-25 MED ORDER — SODIUM CHLORIDE 0.9 % IV SOLN
Freq: Once | INTRAVENOUS | Status: AC
  Administered 2016-06-25: 09:00:00 via INTRAVENOUS

## 2016-06-25 MED ORDER — BORTEZOMIB CHEMO SQ INJECTION 3.5 MG (2.5MG/ML)
1.5000 mg/m2 | Freq: Once | INTRAMUSCULAR | Status: AC
  Administered 2016-06-25: 2.5 mg via SUBCUTANEOUS
  Filled 2016-06-25: qty 2.5

## 2016-06-25 MED ORDER — ACETAMINOPHEN 325 MG PO TABS
650.0000 mg | ORAL_TABLET | Freq: Once | ORAL | Status: AC
  Administered 2016-06-25: 650 mg via ORAL

## 2016-06-25 NOTE — Patient Instructions (Signed)

## 2016-06-25 NOTE — Patient Instructions (Signed)
[facility] Discharge Instructions for Patients Receiving Chemotherapy  Today you received the following chemotherapy agents Darzalex and Velcade   To help prevent nausea and vomiting after your treatment, we encourage you to take your nausea medication as directed.    If you develop nausea and vomiting that is not controlled by your nausea medication, call the clinic.   BELOW ARE SYMPTOMS THAT SHOULD BE REPORTED IMMEDIATELY:  *FEVER GREATER THAN 100.5 F  *CHILLS WITH OR WITHOUT FEVER  NAUSEA AND VOMITING THAT IS NOT CONTROLLED WITH YOUR NAUSEA MEDICATION  *UNUSUAL SHORTNESS OF BREATH  *UNUSUAL BRUISING OR BLEEDING  TENDERNESS IN MOUTH AND THROAT WITH OR WITHOUT PRESENCE OF ULCERS  *URINARY PROBLEMS  *BOWEL PROBLEMS  UNUSUAL RASH Items with * indicate a potential emergency and should be followed up as soon as possible.  Feel free to call the clinic you have any questions or concerns. The clinic phone number is [phone].  Please show the CHEMO ALERT CARD at check-in to the Emergency Department and triage nurse.   

## 2016-07-02 ENCOUNTER — Ambulatory Visit (HOSPITAL_BASED_OUTPATIENT_CLINIC_OR_DEPARTMENT_OTHER): Payer: BLUE CROSS/BLUE SHIELD

## 2016-07-02 VITALS — BP 118/75 | HR 86 | Temp 98.4°F | Resp 18

## 2016-07-02 DIAGNOSIS — Z5112 Encounter for antineoplastic immunotherapy: Secondary | ICD-10-CM

## 2016-07-02 DIAGNOSIS — C9010 Plasma cell leukemia not having achieved remission: Secondary | ICD-10-CM

## 2016-07-02 DIAGNOSIS — C9011 Plasma cell leukemia in remission: Secondary | ICD-10-CM

## 2016-07-02 LAB — CBC WITH DIFFERENTIAL/PLATELET
BASO%: 0 % (ref 0.0–2.0)
Basophils Absolute: 0 10*3/uL (ref 0.0–0.1)
EOS ABS: 0 10*3/uL (ref 0.0–0.5)
EOS%: 0 % (ref 0.0–7.0)
HCT: 39.5 % (ref 34.8–46.6)
HGB: 12.5 g/dL (ref 11.6–15.9)
LYMPH%: 10.7 % — AB (ref 14.0–49.7)
MCH: 29.1 pg (ref 25.1–34.0)
MCHC: 31.6 g/dL (ref 31.5–36.0)
MCV: 91.9 fL (ref 79.5–101.0)
MONO#: 0.1 10*3/uL (ref 0.1–0.9)
MONO%: 1.4 % (ref 0.0–14.0)
NEUT%: 87.9 % — ABNORMAL HIGH (ref 38.4–76.8)
NEUTROS ABS: 3.9 10*3/uL (ref 1.5–6.5)
PLATELETS: 211 10*3/uL (ref 145–400)
RBC: 4.3 10*6/uL (ref 3.70–5.45)
RDW: 14.5 % (ref 11.2–14.5)
WBC: 4.4 10*3/uL (ref 3.9–10.3)
lymph#: 0.5 10*3/uL — ABNORMAL LOW (ref 0.9–3.3)

## 2016-07-02 LAB — COMPREHENSIVE METABOLIC PANEL
ALK PHOS: 125 U/L (ref 40–150)
ALT: 20 U/L (ref 0–55)
AST: 34 U/L (ref 5–34)
Albumin: 4 g/dL (ref 3.5–5.0)
Anion Gap: 12 mEq/L — ABNORMAL HIGH (ref 3–11)
BILIRUBIN TOTAL: 0.23 mg/dL (ref 0.20–1.20)
BUN: 12.7 mg/dL (ref 7.0–26.0)
CO2: 22 meq/L (ref 22–29)
CREATININE: 0.7 mg/dL (ref 0.6–1.1)
Calcium: 9.7 mg/dL (ref 8.4–10.4)
Chloride: 107 mEq/L (ref 98–109)
EGFR: >90 mL/min/{1.73_m2} (ref >90)
GLUCOSE: 122 mg/dL (ref 70–140)
Potassium: 4.4 mEq/L (ref 3.5–5.1)
SODIUM: 141 meq/L (ref 136–145)
TOTAL PROTEIN: 7.7 g/dL (ref 6.4–8.3)

## 2016-07-02 MED ORDER — BORTEZOMIB CHEMO SQ INJECTION 3.5 MG (2.5MG/ML)
1.5000 mg/m2 | Freq: Once | INTRAMUSCULAR | Status: AC
  Administered 2016-07-02: 2.5 mg via SUBCUTANEOUS
  Filled 2016-07-02: qty 2.5

## 2016-07-02 NOTE — Patient Instructions (Signed)
[facility] Discharge Instructions for Patients Receiving Chemotherapy  Today you received the following chemotherapy agents velcade   To help prevent nausea and vomiting after your treatment, we encourage you to take your nausea medication as directed  If you develop nausea and vomiting that is not controlled by your nausea medication, call the clinic.   BELOW ARE SYMPTOMS THAT SHOULD BE REPORTED IMMEDIATELY:  *FEVER GREATER THAN 100.5 F  *CHILLS WITH OR WITHOUT FEVER  NAUSEA AND VOMITING THAT IS NOT CONTROLLED WITH YOUR NAUSEA MEDICATION  *UNUSUAL SHORTNESS OF BREATH  *UNUSUAL BRUISING OR BLEEDING  TENDERNESS IN MOUTH AND THROAT WITH OR WITHOUT PRESENCE OF ULCERS  *URINARY PROBLEMS  *BOWEL PROBLEMS  UNUSUAL RASH Items with * indicate a potential emergency and should be followed up as soon as possible.  Feel free to call the clinic you have any questions or concerns. The clinic phone number is [phone].  

## 2016-07-03 LAB — KAPPA/LAMBDA LIGHT CHAINS
Ig Kappa Free Light Chain: 5.1 mg/L (ref 3.3–19.4)
Ig Lambda Free Light Chain: 14.3 mg/L (ref 5.7–26.3)
Kappa/Lambda FluidC Ratio: 0.36 (ref 0.26–1.65)

## 2016-07-03 NOTE — Progress Notes (Signed)
Open in error

## 2016-07-07 LAB — MULTIPLE MYELOMA PANEL, SERUM
ALBUMIN/GLOB SERPL: 1.2 (ref 0.7–1.7)
ALPHA 1: 0.3 g/dL (ref 0.0–0.4)
ALPHA2 GLOB SERPL ELPH-MCNC: 1.1 g/dL — AB (ref 0.4–1.0)
Albumin SerPl Elph-Mcnc: 3.6 g/dL (ref 2.9–4.4)
B-GLOBULIN SERPL ELPH-MCNC: 1.1 g/dL (ref 0.7–1.3)
GAMMA GLOB SERPL ELPH-MCNC: 0.7 g/dL (ref 0.4–1.8)
GLOBULIN, TOTAL: 3.2 g/dL (ref 2.2–3.9)
IGG (IMMUNOGLOBIN G), SERUM: 796 mg/dL (ref 700–1600)
IGM (IMMUNOGLOBIN M), SRM: 37 mg/dL (ref 26–217)
IgA, Qn, Serum: 28 mg/dL — ABNORMAL LOW (ref 87–352)
M PROTEIN SERPL ELPH-MCNC: 0.2 g/dL — AB
Total Protein: 6.8 g/dL (ref 6.0–8.5)

## 2016-07-08 ENCOUNTER — Other Ambulatory Visit: Payer: Self-pay | Admitting: Hematology

## 2016-07-08 DIAGNOSIS — C9012 Plasma cell leukemia in relapse: Secondary | ICD-10-CM

## 2016-07-09 ENCOUNTER — Other Ambulatory Visit (HOSPITAL_BASED_OUTPATIENT_CLINIC_OR_DEPARTMENT_OTHER): Payer: Medicaid Other

## 2016-07-09 ENCOUNTER — Ambulatory Visit (HOSPITAL_BASED_OUTPATIENT_CLINIC_OR_DEPARTMENT_OTHER): Payer: Medicaid Other

## 2016-07-09 ENCOUNTER — Ambulatory Visit (HOSPITAL_BASED_OUTPATIENT_CLINIC_OR_DEPARTMENT_OTHER): Payer: Medicaid Other | Admitting: Hematology

## 2016-07-09 VITALS — BP 99/61 | HR 87 | Temp 98.0°F | Resp 20

## 2016-07-09 DIAGNOSIS — C9012 Plasma cell leukemia in relapse: Secondary | ICD-10-CM

## 2016-07-09 DIAGNOSIS — K219 Gastro-esophageal reflux disease without esophagitis: Secondary | ICD-10-CM

## 2016-07-09 DIAGNOSIS — E099 Drug or chemical induced diabetes mellitus without complications: Secondary | ICD-10-CM | POA: Diagnosis not present

## 2016-07-09 DIAGNOSIS — R110 Nausea: Secondary | ICD-10-CM | POA: Diagnosis not present

## 2016-07-09 DIAGNOSIS — K5900 Constipation, unspecified: Secondary | ICD-10-CM

## 2016-07-09 DIAGNOSIS — B181 Chronic viral hepatitis B without delta-agent: Secondary | ICD-10-CM

## 2016-07-09 DIAGNOSIS — C9010 Plasma cell leukemia not having achieved remission: Secondary | ICD-10-CM

## 2016-07-09 DIAGNOSIS — K922 Gastrointestinal hemorrhage, unspecified: Secondary | ICD-10-CM | POA: Diagnosis not present

## 2016-07-09 DIAGNOSIS — Z5112 Encounter for antineoplastic immunotherapy: Secondary | ICD-10-CM

## 2016-07-09 LAB — CBC WITH DIFFERENTIAL/PLATELET
BASO%: 0.2 % (ref 0.0–2.0)
BASOS ABS: 0 10*3/uL (ref 0.0–0.1)
EOS ABS: 0.1 10*3/uL (ref 0.0–0.5)
EOS%: 1.3 % (ref 0.0–7.0)
HCT: 33.6 % — ABNORMAL LOW (ref 34.8–46.6)
HGB: 11 g/dL — ABNORMAL LOW (ref 11.6–15.9)
LYMPH%: 12.5 % — ABNORMAL LOW (ref 14.0–49.7)
MCH: 29.3 pg (ref 25.1–34.0)
MCHC: 32.8 g/dL (ref 31.5–36.0)
MCV: 89.6 fL (ref 79.5–101.0)
MONO#: 0.6 10*3/uL (ref 0.1–0.9)
MONO%: 13.1 % (ref 0.0–14.0)
NEUT%: 72.9 % (ref 38.4–76.8)
NEUTROS ABS: 3.4 10*3/uL (ref 1.5–6.5)
PLATELETS: 191 10*3/uL (ref 145–400)
RBC: 3.75 10*6/uL (ref 3.70–5.45)
RDW: 15.4 % — ABNORMAL HIGH (ref 11.2–14.5)
WBC: 4.7 10*3/uL (ref 3.9–10.3)
lymph#: 0.6 10*3/uL — ABNORMAL LOW (ref 0.9–3.3)

## 2016-07-09 LAB — COMPREHENSIVE METABOLIC PANEL
ALBUMIN: 3.5 g/dL (ref 3.5–5.0)
ALK PHOS: 149 U/L (ref 40–150)
ALT: 21 U/L (ref 0–55)
ANION GAP: 8 meq/L (ref 3–11)
AST: 38 U/L — ABNORMAL HIGH (ref 5–34)
BUN: 6.2 mg/dL — ABNORMAL LOW (ref 7.0–26.0)
CO2: 27 meq/L (ref 22–29)
Calcium: 9.3 mg/dL (ref 8.4–10.4)
Chloride: 107 mEq/L (ref 98–109)
Creatinine: 0.7 mg/dL (ref 0.6–1.1)
Glucose: 87 mg/dl (ref 70–140)
Potassium: 4 mEq/L (ref 3.5–5.1)
Sodium: 142 mEq/L (ref 136–145)
TOTAL PROTEIN: 6.9 g/dL (ref 6.4–8.3)
Total Bilirubin: 0.27 mg/dL (ref 0.20–1.20)

## 2016-07-09 MED ORDER — SODIUM CHLORIDE 0.9 % IV SOLN
16.0000 mg/kg | Freq: Once | INTRAVENOUS | Status: AC
  Administered 2016-07-09: 900 mg via INTRAVENOUS
  Filled 2016-07-09: qty 40

## 2016-07-09 MED ORDER — METHYLPREDNISOLONE SODIUM SUCC 125 MG IJ SOLR
INTRAMUSCULAR | Status: AC
  Filled 2016-07-09: qty 2

## 2016-07-09 MED ORDER — ACETAMINOPHEN 325 MG PO TABS
ORAL_TABLET | ORAL | Status: AC
  Filled 2016-07-09: qty 2

## 2016-07-09 MED ORDER — ACETAMINOPHEN 325 MG PO TABS
650.0000 mg | ORAL_TABLET | Freq: Once | ORAL | Status: AC
  Administered 2016-07-09: 650 mg via ORAL

## 2016-07-09 MED ORDER — PROCHLORPERAZINE MALEATE 10 MG PO TABS
10.0000 mg | ORAL_TABLET | Freq: Once | ORAL | Status: AC
  Administered 2016-07-09: 10 mg via ORAL

## 2016-07-09 MED ORDER — HEPARIN SOD (PORK) LOCK FLUSH 100 UNIT/ML IV SOLN
500.0000 [IU] | Freq: Once | INTRAVENOUS | Status: AC | PRN
  Administered 2016-07-09: 500 [IU]
  Filled 2016-07-09: qty 5

## 2016-07-09 MED ORDER — SODIUM CHLORIDE 0.9 % IV SOLN
INTRAVENOUS | Status: DC
  Administered 2016-07-09: 11:00:00 via INTRAVENOUS

## 2016-07-09 MED ORDER — SODIUM CHLORIDE 0.9% FLUSH
10.0000 mL | INTRAVENOUS | Status: DC | PRN
  Administered 2016-07-09: 10 mL
  Filled 2016-07-09: qty 10

## 2016-07-09 MED ORDER — METHYLPREDNISOLONE SODIUM SUCC 125 MG IJ SOLR
125.0000 mg | Freq: Once | INTRAMUSCULAR | Status: AC
  Administered 2016-07-09: 125 mg via INTRAVENOUS

## 2016-07-09 MED ORDER — BORTEZOMIB CHEMO SQ INJECTION 3.5 MG (2.5MG/ML)
1.5000 mg/m2 | Freq: Once | INTRAMUSCULAR | Status: AC
  Administered 2016-07-09: 2.5 mg via SUBCUTANEOUS
  Filled 2016-07-09: qty 2.5

## 2016-07-09 MED ORDER — SODIUM CHLORIDE 0.9 % IV SOLN
INTRAVENOUS | Status: DC
  Administered 2016-07-09: 13:00:00 via INTRAVENOUS

## 2016-07-09 MED ORDER — DIPHENHYDRAMINE HCL 25 MG PO CAPS
ORAL_CAPSULE | ORAL | Status: AC
  Filled 2016-07-09: qty 2

## 2016-07-09 MED ORDER — SODIUM CHLORIDE 0.9 % IV SOLN
Freq: Once | INTRAVENOUS | Status: AC
  Administered 2016-07-09: 10:00:00 via INTRAVENOUS

## 2016-07-09 MED ORDER — DIPHENHYDRAMINE HCL 25 MG PO CAPS
50.0000 mg | ORAL_CAPSULE | Freq: Once | ORAL | Status: AC
  Administered 2016-07-09: 50 mg via ORAL

## 2016-07-09 MED ORDER — PROCHLORPERAZINE MALEATE 10 MG PO TABS
ORAL_TABLET | ORAL | Status: AC
  Filled 2016-07-09: qty 1

## 2016-07-09 NOTE — Progress Notes (Signed)
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      10/10/2014 Miscellaneous    Peripheral blood chemistry and leukocytosis with total white count 78K, comprised of large plasma cells and his normocytic anemia. There is a myeloid left shift with previous surgical radium blasts. Flow cytometry showed 64% plasma cells      10/10/2014 Bone Marrow Biopsy    Markedly hypercellular marrow (95%), Atypical plasma cells comprise 57% of the cellularity. There was diminished multilineage in hematopoiesis with adequate maturation. Breasts less than 1%), no overt dysplasia of the myeloid or erythroid lineages.       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      05/07/2015 - 05/12/2015 Hospital Admission    [facility], [facility] ID workup was negative, EGD showed evidence of gastritis and duodenitis, no H. pylori or CMV.      05/20/2015 - 05/24/2015 Hospital Admission    [facility]      07/11/2015 Bone Marrow Biopsy    Post transplant 100 a bone marrow biopsy showed hypocellular marrow, 20%, no increase in plasma cells (2%) or other abnormalities.      08/22/2015 - 01/02/2016 Chemotherapy    MaintenanceCyborD (cytoxan 3041mm2 iv, bortezomib 1.5 mg/m, dexamethasone 40 mg, every 2 weeks, changed to Velcade maintenance after 4 months treatment      01/16/2016 - 04/19/2016 Chemotherapy    Maintenance Velcade 1.3 mg/m every 2 weeks      02/22/2016 Pathology Results    BONE MARROW: Diagnosis Bone Marrow, Aspirate,Biopsy, and Clot, right iliac - NORMOCELLULAR BONE MARROW FOR AGE WITH TRILINEAGE HEMATOPOIESIS. - PLASMACYTOSIS (PLASMA CELLS 12%). - SEE COMMENT. PERIPHERAL BLOOD: - OCCASIONAL CIRCULATING PLASMA CELLS.      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 female is here because of recently diagnosed plasma cell leukemia. [facility] and is here to establish her local oncological care [place] is accompanied to  the clinic by her daughter and interpreter.  She presented to our hospital lth with worsening dyspnea, fatigue, cough, subjective fevers and chills, and was admitted on 09/14/2014. She was found to have allergy WBC 54.1K, hemoglobin 8.1, plt 310, she was treated with symptom management, and was discharged home on 09/17/2014, with a plan to follow-up with hematology. She represents to emergency room  on 10/07/2014, was found to have white count of 78K, and worsening anemia with hemoglobin 6. She was seen by my partner Dr. Jonette Eva and plasma cell leukemia was suspected. She was transferred to [facility] for further leukemia work-up and treatment. Bone marrow biopsy was done, which confirmed plasma cell leukemia, and she started chemotherapy with Velcade, Cytoxan and dexamethasone. She received weekly dose twice, last dose on 10/20/2014. She was discharged to home afterwards. She had a mediport placed during her hospitalization.  She has moderate fatigue, low appetie, she lost about 13 lbs in the past few months. She has moderate abdominal pain, 5-6/10, persistent, she does not take any pain meds.   I reviewed her medical records extensively, and discussed her case with Dr. Jorje Guild and [place] program coordinator.  CURRENT THERAPY:   1. Velcade 1.83m/m2 and dexa 286mevery weeks 2. Daratumumab weekly started on 04/18/2016, changed to every 2 weeks on 06/25/2016 3. zometa every 4 weeks started on 11/27/2015   INTERIM HISTORY:   Sheryl [name] returns for follow up. She presents to the clinic today in the infusion room and without translator. She is very tired from benadryl. She is tolerating better without cytoxan chemo. She has been doing well overall, no pain, fever or other new complains. Her appetite and energy level overall has improved lately.    MEDICAL HISTORY:  Past Medical History:  Diagnosis Date  . Chills with fever    intermittently since d/c from hospital  . Dysuria-frequency syndrome    w/ pink urine  . GERD (gastroesophageal reflux disease)   . Hepatitis   . History of positive PPD    DX 2011--  CXR DONE NO EVIDENCE  . History of ureter stent   . Hydronephrosis, right   . Neuromuscular disorder (H[facility])    legs numb intermittently  . 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  . Pneumonia   . Right ureteral stone   . Urosepsis 8/14   [facility]    SURGICAL HISTORY: Past  Surgical History:  Procedure Laterality Date  . CYSTOSCOPY W/ URETERAL STENT PLACEMENT Right 09/25/2012   Procedure: CYSTOSCOPY WITH RETROGRADE PYELOGRAM/URETERAL STENT PLACEMENT;  Surgeon: ThAlexis FrockMD;  Location: [facility];  Service: Urology;  Laterality: Right;  . CYSTOSCOPY WITH RETROGRADE PYELOGRAM, URETEROSCOPY AND STENT PLACEMENT Right 10/15/2012   Procedure: CYSTOSCOPY WITH RETROGRADE PYELOGRAM, URETEROSCOPY AND REMOVAL STENT WITH  STENT PLACEMENT;  Surgeon: ThAlexis FrockMD;  [facility] Service[facility] Urology;  Laterality: Right;  . ESOPHAGOGASTRODUODENOSCOPY (EGD) WITH PROPOFOL N/A 11/16/2014   Procedure: ESOPHAGOGASTRODUODENOSCOPY (EGD) WITH PROPOFOL;  Surgeon: DaMilus BanisterMD;  Location: [facility];  Service: Endoscopy;  Laterality: N/A;  . HOLMIUM LASER APPLICATION Right 10/05/99/7510 Procedure: HOLMIUM LASER APPLICATION;  Surgeon: ThAlexis FrockMD;  [facility] Service: Urology;  Laterality: Right;  . LIVER BIOPSY    . OTHER SURGICAL HISTORY Right    removal of ovarian cyst  . removal of uterine cyst     years ago  . RIGHT VATS W/ DRAINAGE PEURAL EFFUSION AND BX'S  10-30-2008  SOCIAL HISTORY: Social History   Social History  . Marital status: Single    Spouse name: N/A  . Number of children: 3  . Years of education: N/A   Social History Main Topics  . Smoking status: Never Smoker  . Smokeless tobacco: Never Used  . Alcohol use No  . Drug use: No  . Sexual activity: Not Currently    Birth control/ protection: Abstinence   Other Topics Concern  . Not on file   Social History Narrative  . No narrative on file    FAMILY HISTORY: Family History  Problem Relation Age of Onset  . Stomach cancer Mother   . Lung disease Father   . Asthma Father     ALLERGIES:  has No Known Allergies.  MEDICATIONS:  Current Outpatient Prescriptions  Medication Sig Dispense Refill  . acetaminophen (TYLENOL) 325 MG  tablet Take 2 tablets (650 mg total) by mouth every 6 (six) hours as needed for mild pain (or Fever >/= 101). 30 tablet 0  . acyclovir (ZOVIRAX) 800 MG tablet Take 1 tablet (800 mg total) by mouth 2 (two) times daily. 60 tablet 2  . dexamethasone (DECADRON) 4 MG tablet Take 5 tablets (20 mg total) by mouth once a week. 30 tablet 2  . entecavir (BARACLUDE) 0.5 MG tablet Take 0.5 mg by mouth daily.    . feeding supplement (BOOST / RESOURCE BREEZE) LIQD Take 1 Container by mouth 3 (three) times daily between meals. 237 mL 0  . lidocaine-prilocaine (EMLA) cream Apply 1 application topically as directed.  0  . magic mouthwash w/lidocaine SOLN Take 15 mLs by mouth 4 (four) times daily as needed for mouth pain. 150 mL 0  . metFORMIN (GLUCOPHAGE) 500 MG tablet Take 1 tablet (500 mg total) by mouth 2 (two) times daily with a meal. 60 tablet 1  . omeprazole (PRILOSEC) 20 MG capsule Take 1 capsule (20 mg total) by mouth daily. 30 capsule 3  . ondansetron (ZOFRAN) 8 MG tablet Take 1 tablet (8 mg total) by mouth every 8 (eight) hours as needed for nausea or vomiting. 30 tablet 1  . promethazine (PHENERGAN) 25 MG tablet Take 1 tablet (25 mg total) by mouth every 6 (six) hours as needed for nausea or vomiting. 30 tablet 0   No current facility-administered medications for this visit.    Facility-Administered Medications Ordered in Other Visits  Medication Dose Route Frequency Provider Last Rate Last Dose  . sodium chloride 0.9 % injection 10 mL  10 mL Intravenous PRN Truitt Merle, MD   10 mL at 12/11/15 1532  . sodium chloride 0.9 % injection 10 mL  10 mL Intravenous PRN Truitt Merle, MD   10 mL at 06/11/16 1505  . sodium chloride flush (NS) 0.9 % injection 10 mL  10 mL Intravenous PRN Truitt Merle, MD   10 mL at 10/09/15 1717    REVIEW OF SYSTEMS:  Constitutional: no chills or abnormal night sweats (+) fatigued, occasional headaches (+) fatigue Eyes: Denies blurriness of vision, double vision or watery eyes (+) eyes  feel tired Ears, nose, mouth, throat, and face: Denies mucositis (+) dry mouth/throat Respiratory: Denies, dyspnea or wheezes (+) nonproductive cough  Cardiovascular: Denies palpitation, chest discomfort or lower extremity swelling Gastrointestinal:  Denies nausea, heartburn, reports regular bowel/bladder Skin: Denies abnormal skin rashes Lymphatics: Denies new lymphadenopathy or easy bruising Neurological:Denies numbness, tingling or new weaknesses Behavioral/Psych: Mood is stable, no new changes  All other systems were reviewed with the patient  and are negative.  PHYSICAL EXAMINATION:   ECOG PERFORMANCE STATUS: 1 BP 97/57, HR 84, RR 16, O2sat 100% on RA GENERAL:alert, no distress and comfortable SKIN: skin color, texture, turgor are normal, no rashes or significant lesions EYES: normal, conjunctiva are pink and non-injected, sclera clear OROPHARYNX:no exudate, no erythema and lips, buccal mucosa NECK: supple, thyroid normal size, non-tender, without nodularity LYMPH:  no palpable lymphadenopathy in the cervical, axillary or inguinal LUNGS: clear to auscultation and percussion with normal breathing effort HEART: regular rate & rhythm and no murmurs and no lower extremity edema ABDOMEN:abdomen soft, mildly tender Musculoskeletal:no cyanosis of digits and no clubbing  PSYCH: alert & oriented x 3 with fluent speech NEURO: no focal motor/sensory deficits  LABORATORY DATA:  I have reviewed the data as listed CBC Latest Ref Rng & Units 07/09/2016 07/02/2016 06/25/2016  WBC 3.9 - 10.3 10e3/uL 4.7 4.4 2.5(L)  Hemoglobin 11.6 - 15.9 g/dL 11.0(L) 12.5 11.0(L)  Hematocrit 34.8 - 46.6 % 33.6(L) 39.5 34.8  Platelets 145 - 400 10e3/uL 191 211 158    CMP Latest Ref Rng & Units 07/09/2016 07/02/2016 07/02/2016  Glucose 70 - 140 mg/dl 87 122 -  BUN 7.0 - 26.0 mg/dL 6.2(L) 12.7 -  Creatinine 0.6 - 1.1 mg/dL 0.7 0.7 -  Sodium 136 - 145 mEq/L 142 141 -  Potassium 3.5 - 5.1 mEq/L 4.0 4.4 -  Chloride  101 - 111 mmol/L - - -  CO2 22 - 29 mEq/L 27 22 -  Calcium 8.4 - 10.4 mg/dL 9.3 9.7 -  Total Protein 6.4 - 8.3 g/dL 6.9 7.7 6.8  Total Bilirubin 0.20 - 1.20 mg/dL 0.27 0.23 -  Alkaline Phos 40 - 150 U/L 149 125 -  AST 5 - 34 U/L 38(H) 34 -  ALT 0 - 55 U/L 21 20 -    SPEP M-protein  09/15/2014: 4.2 10/08/14: 4.6 12/08/2014: 0.2 02/15/2015: not sufficient sample for test   09/11/2015: not observed 10/09/2015: not det   11/06/2015: not det  12/25/2015: not det  02/13/2016: 0.3 03/12/2016: 0.8 04/09/2016: 0.6 05/08/16: 0.1 06/04/16: 0.1   IgG mg/dl 11/03/2014: 3150  12/08/2014:  759 02/14/2014: 860 09/11/2015: 1347 10/09/2015: 1457 11/06/2015: 1504 12/25/2015: 1400 02/13/2016: 1543 03/12/2016: 1860 04/09/2016: 1620 05/08/16: 749 06/04/16: 751  Kappa/lambda light chains levels and ration  11/03/14: 0.75, 152, 0.00 12/22/2014: 1.10, 2.60, 0.42 02/15/2015: 9.59,14.35, 0.67 09/11/2015: 2.44, 2.72, 0.90 10/09/2015: 3.09, 3.49, 0.89 11/06/2015: 2.30, 2.62, 0.88 12/25/2015: 2.51, 3.0, 0.84 02/13/2016: 2.64, 15.3, 0.17 03/12/2016: 2.27, 45.5, 0.05 04/09/2016: 3.33, 45.5, 0.07 05/08/2016: 0.49, 1.21, 0.40 06/04/2016: 0.83, 1.11, 0.75 07/02/16: 0.51, 1.43, 0.36  24 h urine UPEP/IFE and light chain: 11/03/2014: IFE showed a monoclonal IgG heavy chain with associated lambda light chain. M protein was Undetectable   PATHOLOGY REPORT  Bone Marrow (BM) and Peripheral Blood (PB) FINAL PATHOLOGIC DIAGNOSIS  BONE MARROW: 07/11/2015 Hypocellular marrow (20%) with no increase in plasma cells (2%). See comment.  PERIPHERAL BLOOD: Mild anemia. No circulating plasma cells identified. See comment and CBC data.  BONE MARROW: 02/22/2016 Diagnosis Bone Marrow, Aspirate,Biopsy, and Clot, right iliac - NORMOCELLULAR BONE MARROW FOR AGE WITH TRILINEAGE HEMATOPOIESIS. - PLASMACYTOSIS (PLASMA CELLS 12%). - SEE COMMENT. PERIPHERAL BLOOD: - OCCASIONAL CIRCULATING PLASMA CELLS.  RADIOGRAPHIC STUDIES: I have personally reviewed  the radiological images as listed and agreed with the findings in the report. No results found.   CT Biopsy 02/22/16 IMPRESSION: 1. Technically successful CT guided right iliac bone core and aspiration biopsy.  ASSESSMENT &  PLAN: 46 y.o. [place] woman, presented with anemia and leokocytosis   1. 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 Unfortunately this showed increased plasma cells 12% with additional lambda light chain staining pending. This is relapse of disease. Cytogenetics was normal  -Her M protein and lambda free light chain level has significantly increased lately, consistent with disease relapse -Her recent LP showed negative CSF, no evidence of CNS involvement. -I have previously spoken with Dr. Norma Fredrickson, we agreed to change her CyBorD to weekly, and add daratumumab weekly, starting today. Potential side effects would discussed with patient, especially cytopenia and infusion reaction, she will take dexamethasone the day before Dara infusion.  -If her disease does not respond to treatment quickly, or if she has intolerance issue to cyBorD, I will change to pomalidomide,  dexamethasone and Dara  -continue acyclovir  -Continue Zometa every 4 weeks for a total of 2 years (until 11/2017) -lab previously reviewed, adequate for treatment. - Due to recent hospitalization for severe cytopenia and infection, Cytoxan has been held. -she has restarted weekly Velcade and dexa, and now on Dara every 2 weeks, tolerating well, will continue. Her recent dara-specific IFE was negative for M-protein on 07/07/16 at [facility], she probably has achieved second CR -Today labs reviewed and adequate to continue  treatment    2. Type 2 diabetes, steroids induced  -She was noticed to have increased blood glucose level lately, with random blood glucose above 200. She also developed diabetic symptoms. No history of diabetes in the past. -This is likely steroids induced hyperglycemia -I strongly recommend her to avoid any soft drink and sweats, and watch her carbohydrate intake, and exercise more  -She has been seen by her primary care physician. She was not given any medication to manage her glucose. I will call her primary care physician to discuss adding metformin. -I previously instructed her to take metformin every day since she is now taking dexamethasone more frequently.  -She restarted her dexamethasone weekly as part of his leukemia treatment, and she will also receive steroids as premedication, I recommend her to take metformin 500 mg twice daily continuously, to control her hyperglycemia.Potential side effects discussed, she agreed to proceed. -Continue close monitoring. -We previously discussed restarting to metformin one pill a day while on steroids to see if her levels will stabilize. I previously encouraged her to take it in the morning with breakfast.   3. Chronic Hepatitis B  - She will follow-up GI clinic at [facility] -continue entecavir. Our nursing staff has previously called her physician's office to request this for her.  4. GERD, recent GI bleeding and nausea  -Continue omeprazole daily. We previously discussed steroids can worsen her acid reflux. -She previously had an EGD at [facility] in 2017.  -- she did not do her colonoscopy because she did not have anyone to accompany her during visit. They will call to reschedule.   5. Constipation -I previously encouraged the patient to try senca-s, ducalax 1-3 times a day or milk of magnesium if miralax doresn't help with constipation  - I previously advised the patient to take something everyday to help with constipation.   Plan  -She  will continue weekly Velcade and dexa, and Dara every 2 weeks -  labs and f/u in 2 weeks by NP and 4 weeks by me -Will cut benadryl in half next treatment  All questions were answered. The patient knows to call the clinic with any problems, questions or concerns.  I spent 20 minutes counseling the patient face to face. We used the vedio interpreter service. The total time spent in the appointment was 25 minutes and more than 50% was on counseling.  This document serves as a record of services personally performed by Truitt Merle, MD. It was created on her behalf by Joslyn Devon, a trained medical scribe. The creation of this record is based on the scribe's personal observations and the provider's statements to them. This document has been checked and approved by the attending provider.    Truitt Merle, MD 07/09/2016

## 2016-07-09 NOTE — Patient Instructions (Signed)
[facility] Discharge Instructions for Patients Receiving Chemotherapy  Today you received the following chemotherapy agents Darzalex/Velcade To help prevent nausea and vomiting after your treatment, we encourage you to take your nausea medication as prescribed.   If you develop nausea and vomiting that is not controlled by your nausea medication, call the clinic.   BELOW ARE SYMPTOMS THAT SHOULD BE REPORTED IMMEDIATELY:  *FEVER GREATER THAN 100.5 F  *CHILLS WITH OR WITHOUT FEVER  NAUSEA AND VOMITING THAT IS NOT CONTROLLED WITH YOUR NAUSEA MEDICATION  *UNUSUAL SHORTNESS OF BREATH  *UNUSUAL BRUISING OR BLEEDING  TENDERNESS IN MOUTH AND THROAT WITH OR WITHOUT PRESENCE OF ULCERS  *URINARY PROBLEMS  *BOWEL PROBLEMS  UNUSUAL RASH Items with * indicate a potential emergency and should be followed up as soon as possible.  Feel free to call the clinic you have any questions or concerns. The clinic phone number is [phone].  Please show the CHEMO ALERT CARD at check-in to the Emergency Department and triage nurse.   

## 2016-07-09 NOTE — Progress Notes (Signed)
Patient blood pressure 84/50 before darzalex.  Rechecked 81/51.  Patient feels sleepy after premeds, but denies dizziness.   Discussed with Dr.  Burr Medico - will give extra IV fluids before darzalex.   NS 524ml over an hour.

## 2016-07-10 LAB — PROTEIN ELECTROPHORESIS, SERUM
A/G RATIO SPE: 1 (ref 0.7–1.7)
ALBUMIN: 3.2 g/dL (ref 2.9–4.4)
ALPHA 1: 0.3 g/dL (ref 0.0–0.4)
Alpha 2: 1.1 g/dL — ABNORMAL HIGH (ref 0.4–1.0)
BETA: 1 g/dL (ref 0.7–1.3)
GAMMA GLOBULIN: 0.7 g/dL (ref 0.4–1.8)
Globulin, Total: 3.1 g/dL (ref 2.2–3.9)
M-Spike, %: 0.1 g/dL — ABNORMAL HIGH
Total Protein: 6.3 g/dL (ref 6.0–8.5)

## 2016-07-12 ENCOUNTER — Encounter: Payer: Self-pay | Admitting: Hematology

## 2016-07-14 LAB — IFE, DARA-SPECIFIC, SERUM
IGM (IMMUNOGLOBIN M), SRM: 36 mg/dL (ref 26–217)
IgA, Qn, Serum: 29 mg/dL — ABNORMAL LOW (ref 87–352)
IgG, Qn, Serum: 701 mg/dL (ref 700–1600)

## 2016-07-16 ENCOUNTER — Ambulatory Visit (HOSPITAL_BASED_OUTPATIENT_CLINIC_OR_DEPARTMENT_OTHER): Payer: Medicaid Other

## 2016-07-16 VITALS — BP 100/50 | HR 95 | Temp 98.2°F | Resp 20

## 2016-07-16 DIAGNOSIS — Z5112 Encounter for antineoplastic immunotherapy: Secondary | ICD-10-CM

## 2016-07-16 DIAGNOSIS — C9012 Plasma cell leukemia in relapse: Secondary | ICD-10-CM

## 2016-07-16 DIAGNOSIS — C9010 Plasma cell leukemia not having achieved remission: Secondary | ICD-10-CM

## 2016-07-16 MED ORDER — METHYLPREDNISOLONE SODIUM SUCC 125 MG IJ SOLR: 125.0000 mg | Freq: Once | INTRAMUSCULAR | Status: DC

## 2016-07-16 MED ORDER — DIPHENHYDRAMINE HCL 25 MG PO CAPS
50.0000 mg | ORAL_CAPSULE | Freq: Once | ORAL | Status: DC
Start: 2016-07-16 — End: 2016-07-16

## 2016-07-16 MED ORDER — ACETAMINOPHEN 325 MG PO TABS: 650.0000 mg | ORAL_TABLET | Freq: Once | ORAL | Status: DC

## 2016-07-16 MED ORDER — HEPARIN SOD (PORK) LOCK FLUSH 100 UNIT/ML IV SOLN
500.0000 [IU] | Freq: Once | INTRAVENOUS | Status: DC | PRN
  Filled 2016-07-16: qty 5

## 2016-07-16 MED ORDER — SODIUM CHLORIDE 0.9% FLUSH
10.0000 mL | INTRAVENOUS | Status: DC | PRN
  Filled 2016-07-16: qty 10

## 2016-07-16 MED ORDER — BORTEZOMIB CHEMO SQ INJECTION 3.5 MG (2.5MG/ML)
1.5000 mg/m2 | Freq: Once | INTRAMUSCULAR | Status: AC
  Administered 2016-07-16: 2.5 mg via SUBCUTANEOUS
  Filled 2016-07-16: qty 2.5

## 2016-07-16 MED ORDER — PROCHLORPERAZINE MALEATE 10 MG PO TABS: 10.0000 mg | ORAL_TABLET | Freq: Once | ORAL | Status: DC

## 2016-07-16 MED ORDER — SODIUM CHLORIDE 0.9 % IV SOLN: 16.0000 mg/kg | Freq: Once | INTRAVENOUS | Status: DC

## 2016-07-16 MED ORDER — SODIUM CHLORIDE 0.9 % IV SOLN: Freq: Once | INTRAVENOUS | Status: DC

## 2016-07-16 NOTE — Patient Instructions (Addendum)
[facility] Discharge Instructions for Patients Receiving Chemotherapy  Today you received the following chemotherapy agents Velcade  To help prevent nausea and vomiting after your treatment, we encourage you to take your nausea medication    If you develop nausea and vomiting that is not controlled by your nausea medication, call the clinic.   BELOW ARE SYMPTOMS THAT SHOULD BE REPORTED IMMEDIATELY:  *FEVER GREATER THAN 100.5 F  *CHILLS WITH OR WITHOUT FEVER  NAUSEA AND VOMITING THAT IS NOT CONTROLLED WITH YOUR NAUSEA MEDICATION  *UNUSUAL SHORTNESS OF BREATH  *UNUSUAL BRUISING OR BLEEDING  TENDERNESS IN MOUTH AND THROAT WITH OR WITHOUT PRESENCE OF ULCERS  *URINARY PROBLEMS  *BOWEL PROBLEMS  UNUSUAL RASH Items with * indicate a potential emergency and should be followed up as soon as possible.  Feel free to call the clinic you have any questions or concerns. The clinic phone number is [phone].  Please show the CHEMO ALERT CARD at check-in to the Emergency Department and triage nurse.   

## 2016-07-23 ENCOUNTER — Ambulatory Visit: Payer: Medicaid Other

## 2016-07-23 ENCOUNTER — Other Ambulatory Visit (HOSPITAL_BASED_OUTPATIENT_CLINIC_OR_DEPARTMENT_OTHER): Payer: Medicaid Other

## 2016-07-23 ENCOUNTER — Ambulatory Visit (HOSPITAL_BASED_OUTPATIENT_CLINIC_OR_DEPARTMENT_OTHER): Payer: Medicaid Other

## 2016-07-23 VITALS — BP 103/65 | HR 79 | Temp 98.3°F | Resp 16

## 2016-07-23 DIAGNOSIS — C9010 Plasma cell leukemia not having achieved remission: Secondary | ICD-10-CM

## 2016-07-23 DIAGNOSIS — C9012 Plasma cell leukemia in relapse: Secondary | ICD-10-CM

## 2016-07-23 DIAGNOSIS — Z5112 Encounter for antineoplastic immunotherapy: Secondary | ICD-10-CM

## 2016-07-23 DIAGNOSIS — C9011 Plasma cell leukemia in remission: Secondary | ICD-10-CM

## 2016-07-23 DIAGNOSIS — Z95828 Presence of other vascular implants and grafts: Secondary | ICD-10-CM

## 2016-07-23 LAB — CBC WITH DIFFERENTIAL/PLATELET
BASO%: 0.3 % (ref 0.0–2.0)
BASOS ABS: 0 10*3/uL (ref 0.0–0.1)
EOS%: 0.7 % (ref 0.0–7.0)
Eosinophils Absolute: 0 10*3/uL (ref 0.0–0.5)
HEMATOCRIT: 32.4 % — AB (ref 34.8–46.6)
HEMOGLOBIN: 10.5 g/dL — AB (ref 11.6–15.9)
LYMPH#: 0.7 10*3/uL — AB (ref 0.9–3.3)
LYMPH%: 14.8 % (ref 14.0–49.7)
MCH: 28.4 pg (ref 25.1–34.0)
MCHC: 32.4 g/dL (ref 31.5–36.0)
MCV: 87.6 fL (ref 79.5–101.0)
MONO#: 0.7 10*3/uL (ref 0.1–0.9)
MONO%: 14.8 % — ABNORMAL HIGH (ref 0.0–14.0)
NEUT#: 3.3 10*3/uL (ref 1.5–6.5)
NEUT%: 69.4 % (ref 38.4–76.8)
Platelets: 191 10*3/uL (ref 145–400)
RBC: 3.7 10*6/uL (ref 3.70–5.45)
RDW: 15 % — AB (ref 11.2–14.5)
WBC: 4.7 10*3/uL (ref 3.9–10.3)

## 2016-07-23 LAB — COMPREHENSIVE METABOLIC PANEL
ALBUMIN: 3.4 g/dL — AB (ref 3.5–5.0)
ALK PHOS: 132 U/L (ref 40–150)
ALT: 10 U/L (ref 0–55)
ANION GAP: 8 meq/L (ref 3–11)
AST: 21 U/L (ref 5–34)
BUN: 13.4 mg/dL (ref 7.0–26.0)
CALCIUM: 9.2 mg/dL (ref 8.4–10.4)
CO2: 25 mEq/L (ref 22–29)
CREATININE: 0.6 mg/dL (ref 0.6–1.1)
Chloride: 106 mEq/L (ref 98–109)
EGFR: >90 mL/min/{1.73_m2} (ref >90)
Glucose: 91 mg/dl (ref 70–140)
POTASSIUM: 3.8 meq/L (ref 3.5–5.1)
Sodium: 140 mEq/L (ref 136–145)
Total Bilirubin: 0.28 mg/dL (ref 0.20–1.20)
Total Protein: 6.6 g/dL (ref 6.4–8.3)

## 2016-07-23 MED ORDER — DIPHENHYDRAMINE HCL 25 MG PO CAPS
50.0000 mg | ORAL_CAPSULE | Freq: Once | ORAL | Status: AC
  Administered 2016-07-23: 50 mg via ORAL

## 2016-07-23 MED ORDER — DARATUMUMAB CHEMO INJECTION 400 MG/20ML
16.0000 mg/kg | Freq: Once | INTRAVENOUS | Status: AC
  Administered 2016-07-23: 900 mg via INTRAVENOUS
  Filled 2016-07-23: qty 40

## 2016-07-23 MED ORDER — METHYLPREDNISOLONE SODIUM SUCC 125 MG IJ SOLR
125.0000 mg | Freq: Once | INTRAMUSCULAR | Status: AC
Start: 2016-07-23 — End: 2016-07-23
  Administered 2016-07-23: 125 mg via INTRAVENOUS

## 2016-07-23 MED ORDER — BORTEZOMIB CHEMO SQ INJECTION 3.5 MG (2.5MG/ML)
1.5000 mg/m2 | Freq: Once | INTRAMUSCULAR | Status: AC
  Administered 2016-07-23: 2.5 mg via SUBCUTANEOUS
  Filled 2016-07-23: qty 2.5

## 2016-07-23 MED ORDER — HEPARIN SOD (PORK) LOCK FLUSH 100 UNIT/ML IV SOLN
500.0000 [IU] | Freq: Once | INTRAVENOUS | Status: AC | PRN
  Administered 2016-07-23: 500 [IU]
  Filled 2016-07-23: qty 5

## 2016-07-23 MED ORDER — ACETAMINOPHEN 325 MG PO TABS
ORAL_TABLET | ORAL | Status: AC
  Filled 2016-07-23: qty 2

## 2016-07-23 MED ORDER — METHYLPREDNISOLONE SODIUM SUCC 125 MG IJ SOLR
INTRAMUSCULAR | Status: AC
  Filled 2016-07-23: qty 2

## 2016-07-23 MED ORDER — PROCHLORPERAZINE MALEATE 10 MG PO TABS
10.0000 mg | ORAL_TABLET | Freq: Once | ORAL | Status: AC
  Administered 2016-07-23: 10 mg via ORAL

## 2016-07-23 MED ORDER — SODIUM CHLORIDE 0.9% FLUSH
10.0000 mL | INTRAVENOUS | Status: DC | PRN
  Administered 2016-07-23: 10 mL
  Filled 2016-07-23: qty 10

## 2016-07-23 MED ORDER — SODIUM CHLORIDE 0.9 % IJ SOLN
10.0000 mL | INTRAMUSCULAR | Status: DC | PRN
  Administered 2016-07-23: 10 mL via INTRAVENOUS
  Filled 2016-07-23: qty 10

## 2016-07-23 MED ORDER — SODIUM CHLORIDE 0.9 % IV SOLN
Freq: Once | INTRAVENOUS | Status: AC
  Administered 2016-07-23: 10:00:00 via INTRAVENOUS

## 2016-07-23 MED ORDER — DIPHENHYDRAMINE HCL 25 MG PO CAPS
ORAL_CAPSULE | ORAL | Status: AC
  Filled 2016-07-23: qty 2

## 2016-07-23 MED ORDER — ACETAMINOPHEN 325 MG PO TABS
650.0000 mg | ORAL_TABLET | Freq: Once | ORAL | Status: AC
  Administered 2016-07-23: 650 mg via ORAL

## 2016-07-23 MED ORDER — PROCHLORPERAZINE MALEATE 10 MG PO TABS
ORAL_TABLET | ORAL | Status: AC
  Filled 2016-07-23: qty 1

## 2016-07-23 NOTE — Patient Instructions (Signed)
[facility] Discharge Instructions for Patients Receiving Chemotherapy  Today you received the following chemotherapy agents Darzalex and Velcade   To help prevent nausea and vomiting after your treatment, we encourage you to take your nausea medication as directed.    If you develop nausea and vomiting that is not controlled by your nausea medication, call the clinic.   BELOW ARE SYMPTOMS THAT SHOULD BE REPORTED IMMEDIATELY:  *FEVER GREATER THAN 100.5 F  *CHILLS WITH OR WITHOUT FEVER  NAUSEA AND VOMITING THAT IS NOT CONTROLLED WITH YOUR NAUSEA MEDICATION  *UNUSUAL SHORTNESS OF BREATH  *UNUSUAL BRUISING OR BLEEDING  TENDERNESS IN MOUTH AND THROAT WITH OR WITHOUT PRESENCE OF ULCERS  *URINARY PROBLEMS  *BOWEL PROBLEMS  UNUSUAL RASH Items with * indicate a potential emergency and should be followed up as soon as possible.  Feel free to call the clinic you have any questions or concerns. The clinic phone number is [phone].  Please show the CHEMO ALERT CARD at check-in to the Emergency Department and triage nurse.   

## 2016-07-24 LAB — KAPPA/LAMBDA LIGHT CHAINS
IG LAMBDA FREE LIGHT CHAIN: 10.2 mg/L (ref 5.7–26.3)
Ig Kappa Free Light Chain: 6.8 mg/L (ref 3.3–19.4)
Kappa/Lambda FluidC Ratio: 0.67 (ref 0.26–1.65)

## 2016-07-30 ENCOUNTER — Telehealth: Payer: Self-pay | Admitting: Hematology

## 2016-07-30 ENCOUNTER — Other Ambulatory Visit (HOSPITAL_BASED_OUTPATIENT_CLINIC_OR_DEPARTMENT_OTHER): Payer: Medicaid Other

## 2016-07-30 ENCOUNTER — Ambulatory Visit (HOSPITAL_BASED_OUTPATIENT_CLINIC_OR_DEPARTMENT_OTHER): Payer: Medicaid Other

## 2016-07-30 VITALS — BP 108/73 | HR 85 | Temp 98.7°F | Resp 18

## 2016-07-30 DIAGNOSIS — C9010 Plasma cell leukemia not having achieved remission: Secondary | ICD-10-CM

## 2016-07-30 DIAGNOSIS — Z5112 Encounter for antineoplastic immunotherapy: Secondary | ICD-10-CM | POA: Diagnosis not present

## 2016-07-30 DIAGNOSIS — C9012 Plasma cell leukemia in relapse: Secondary | ICD-10-CM

## 2016-07-30 DIAGNOSIS — Z95828 Presence of other vascular implants and grafts: Secondary | ICD-10-CM

## 2016-07-30 LAB — COMPREHENSIVE METABOLIC PANEL
ALT: 8 U/L (ref 0–55)
AST: 24 U/L (ref 5–34)
Albumin: 3.2 g/dL — ABNORMAL LOW (ref 3.5–5.0)
Alkaline Phosphatase: 109 U/L (ref 40–150)
Anion Gap: 7 mEq/L (ref 3–11)
BUN: 8.7 mg/dL (ref 7.0–26.0)
CALCIUM: 8.7 mg/dL (ref 8.4–10.4)
CHLORIDE: 108 meq/L (ref 98–109)
CO2: 25 meq/L (ref 22–29)
CREATININE: 0.6 mg/dL (ref 0.6–1.1)
EGFR: >90 mL/min/{1.73_m2} (ref >90)
GLUCOSE: 75 mg/dL (ref 70–140)
Potassium: 3.9 mEq/L (ref 3.5–5.1)
Sodium: 140 mEq/L (ref 136–145)
Total Bilirubin: 0.23 mg/dL (ref 0.20–1.20)
Total Protein: 6.2 g/dL — ABNORMAL LOW (ref 6.4–8.3)

## 2016-07-30 LAB — CBC WITH DIFFERENTIAL/PLATELET
BASO%: 0 % (ref 0.0–2.0)
Basophils Absolute: 0 10*3/uL (ref 0.0–0.1)
EOS%: 0.4 % (ref 0.0–7.0)
Eosinophils Absolute: 0 10*3/uL (ref 0.0–0.5)
HEMATOCRIT: 33 % — AB (ref 34.8–46.6)
HGB: 10.2 g/dL — ABNORMAL LOW (ref 11.6–15.9)
LYMPH#: 0.9 10*3/uL (ref 0.9–3.3)
LYMPH%: 16.6 % (ref 14.0–49.7)
MCH: 27.8 pg (ref 25.1–34.0)
MCHC: 30.9 g/dL — AB (ref 31.5–36.0)
MCV: 89.9 fL (ref 79.5–101.0)
MONO#: 0.7 10*3/uL (ref 0.1–0.9)
MONO%: 12.2 % (ref 0.0–14.0)
NEUT#: 3.9 10*3/uL (ref 1.5–6.5)
NEUT%: 70.8 % (ref 38.4–76.8)
Platelets: 170 10*3/uL (ref 145–400)
RBC: 3.67 10*6/uL — ABNORMAL LOW (ref 3.70–5.45)
RDW: 13.8 % (ref 11.2–14.5)
WBC: 5.5 10*3/uL (ref 3.9–10.3)

## 2016-07-30 MED ORDER — BORTEZOMIB CHEMO SQ INJECTION 3.5 MG (2.5MG/ML)
1.5000 mg/m2 | Freq: Once | INTRAMUSCULAR | Status: AC
  Administered 2016-07-30: 2.5 mg via SUBCUTANEOUS
  Filled 2016-07-30: qty 2.5

## 2016-07-30 MED ORDER — SODIUM CHLORIDE 0.9 % IJ SOLN
10.0000 mL | INTRAMUSCULAR | Status: DC | PRN
  Administered 2016-07-30: 10 mL via INTRAVENOUS
  Filled 2016-07-30: qty 10

## 2016-07-30 MED ORDER — PROCHLORPERAZINE MALEATE 10 MG PO TABS
ORAL_TABLET | ORAL | Status: AC
  Filled 2016-07-30: qty 1

## 2016-07-30 MED ORDER — HEPARIN SOD (PORK) LOCK FLUSH 100 UNIT/ML IV SOLN
500.0000 [IU] | Freq: Once | INTRAVENOUS | Status: AC | PRN
  Administered 2016-07-30: 500 [IU] via INTRAVENOUS
  Filled 2016-07-30: qty 5

## 2016-07-30 MED ORDER — PROCHLORPERAZINE MALEATE 10 MG PO TABS
10.0000 mg | ORAL_TABLET | Freq: Once | ORAL | Status: AC
  Administered 2016-07-30: 10 mg via ORAL

## 2016-07-30 NOTE — Patient Instructions (Signed)
[facility] Discharge Instructions for Patients Receiving Chemotherapy  Today you received the following chemotherapy agents Velcade. To help prevent nausea and vomiting after your treatment, we encourage you to take your nausea medication as directed.  If you develop nausea and vomiting that is not controlled by your nausea medication, call the clinic.   BELOW ARE SYMPTOMS THAT SHOULD BE REPORTED IMMEDIATELY:  *FEVER GREATER THAN 100.5 F  *CHILLS WITH OR WITHOUT FEVER  NAUSEA AND VOMITING THAT IS NOT CONTROLLED WITH YOUR NAUSEA MEDICATION  *UNUSUAL SHORTNESS OF BREATH  *UNUSUAL BRUISING OR BLEEDING  TENDERNESS IN MOUTH AND THROAT WITH OR WITHOUT PRESENCE OF ULCERS  *URINARY PROBLEMS  *BOWEL PROBLEMS  UNUSUAL RASH Items with * indicate a potential emergency and should be followed up as soon as possible.  Feel free to call the clinic you have any questions or concerns. The clinic phone number is [phone].  Please show the CHEMO ALERT CARD at check-in to the Emergency Department and triage nurse.    

## 2016-07-30 NOTE — Telephone Encounter (Signed)
Patient presented to schedule today to inquire about having treatment today as she has weekly treatment. Patient not on schedule and was last see for f/u 6/6. Patient 6/6 f/u listed as no show and not los completed.   Checked with triage nurse, also patient provider's desk nurse and patient scheduled as follows per desk nurse.  1. Lab/treatment today - ok per charge nurse. 2. Lab/treament/YF with YF to see in infusion 7/3 - dates for 7/5 and 7/6 at capacity.  Patient given new schedule for July and taken to infusion for lab/tx.

## 2016-08-04 NOTE — Progress Notes (Signed)
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      10/10/2014 Miscellaneous    Peripheral blood chemistry and leukocytosis with total white count 78K, comprised of large plasma cells and his normocytic anemia. There is a myeloid left shift with previous surgical radium blasts. Flow cytometry showed 64% plasma cells      10/10/2014 Bone Marrow Biopsy    Markedly hypercellular marrow (95%), Atypical plasma cells comprise 57% of the cellularity. There was diminished multilineage in hematopoiesis with adequate maturation. Breasts less than 1%), no overt dysplasia of the myeloid or erythroid lineages.       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      05/07/2015 - 05/12/2015 Hospital Admission    [facility], [facility] ID workup was negative, EGD showed evidence of gastritis and duodenitis, no H. pylori or CMV.      05/20/2015 - 05/24/2015 Hospital Admission    [facility]      07/11/2015 Bone Marrow Biopsy    Post transplant 100 a bone marrow biopsy showed hypocellular marrow, 20%, no increase in plasma cells (2%) or other abnormalities.      08/22/2015 - 01/02/2016 Chemotherapy    MaintenanceCyborD (cytoxan 3078mm2 iv, bortezomib 1.5 mg/m, dexamethasone 40 mg, every 2 weeks, changed to Velcade maintenance after 4 months treatment      01/16/2016 - 04/19/2016 Chemotherapy    Maintenance Velcade 1.3 mg/m every 2 weeks      02/22/2016 Pathology Results    BONE MARROW: Diagnosis Bone Marrow, Aspirate,Biopsy, and Clot, right iliac - NORMOCELLULAR BONE MARROW FOR AGE WITH TRILINEAGE HEMATOPOIESIS. - PLASMACYTOSIS (PLASMA CELLS 12%). - SEE COMMENT. PERIPHERAL BLOOD: - OCCASIONAL CIRCULATING PLASMA CELLS.      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 female is here because of recently diagnosed plasma cell leukemia. [facility] and is here to establish her local oncological care with [place] is accompanied to  the clinic by her daughter and interpreter.  She presented to our hospital lth with worsening dyspnea, fatigue, cough, subjective fevers and chills, and was admitted on 09/14/2014. She was found to have allergy WBC 54.1K, hemoglobin 8.1, plt 310, she was treated with symptom management, and was discharged home on 09/17/2014, with a plan to follow-up with hematology. She represents to emergency room  on 10/07/2014, was found to have white count of 78K, and worsening anemia with hemoglobin 6. She was seen by my partner Dr. Jonette Eva and plasma cell leukemia was suspected. She was transferred to [facility] for further leukemia work-up and treatment. Bone marrow biopsy was done, which confirmed plasma cell leukemia, and she started chemotherapy with Velcade, Cytoxan and dexamethasone. She received weekly dose twice, last dose on 10/20/2014. She was discharged to home afterwards. She had a mediport placed during her hospitalization.  She has moderate fatigue, low appetie, she lost about 13 lbs in the past few months. She has moderate abdominal pain, 5-6/10, persistent, she does not take any pain meds.   I reviewed her medical records extensively, and discussed her case with Dr. Jorje Guild and [place] program coordinator.  CURRENT THERAPY:   1. Velcade 1.22m/m2 and dexa 292mevery weeks 2. Daratumumab weekly started on 04/18/2016, changed to every 2 weeks on 06/25/2016 3. zometa every 4 weeks started on 11/27/2015   INTERIM HISTORY:   Sheryl returns for follow up. She is being seen in the infusion room with an interpreter. She has been doing better. She feels chills once in a while and tolerable abdominal pain. Denies any fever. She has not been taking her hepatitis B medication.     Due to a trip Aug 1-5, she wants to reschedule her appointments. She hs also now gone back to work full time.   MEDICAL HISTORY:  Past Medical History:  Diagnosis Date  . Chills with fever    intermittently since d/c from hospital  . Dysuria-frequency syndrome    w/ pink urine  . GERD (gastroesophageal reflux disease)   . Hepatitis   . History of positive PPD    DX 2011--  CXR DONE NO EVIDENCE  . History of ureter stent   . Hydronephrosis, right   . Neuromuscular disorder (H[facility])    legs numb intermittently  . 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  . Pneumonia   . Right ureteral stone   . Urosepsis 8/14   admitted  [facility]    SURGICAL HISTORY: Past Surgical History:  Procedure Laterality Date  . CYSTOSCOPY W/ URETERAL STENT PLACEMENT Right 09/25/2012   Procedure: CYSTOSCOPY WITH RETROGRADE PYELOGRAM/URETERAL STENT PLACEMENT;  Surgeon: ThAlexis FrockMD;  Location: [facility];  Service: Urology;  Laterality: Right;  . CYSTOSCOPY WITH RETROGRADE PYELOGRAM, URETEROSCOPY AND STENT PLACEMENT Right 10/15/2012   Procedure: CYSTOSCOPY WITH RETROGRADE PYELOGRAM, URETEROSCOPY AND REMOVAL STENT WITH  STENT PLACEMENT;  Surgeon: ThAlexis FrockMD;  [facility];  Laterality: Right;  . ESOPHAGOGASTRODUODENOSCOPY (EGD) WITH PROPOFOL N/A 11/16/2014   Procedure: ESOPHAGOGASTRODUODENOSCOPY (EGD) WITH PROPOFOL;  Surgeon: DaMilus BanisterMD;  Location: [facility];  Service: Endoscopy;  Laterality: N/A;  . HOLMIUM LASER APPLICATION Right 9/0/97/3532 Procedure: HOLMIUM LASER APPLICATION;  Surgeon: ThAlexis FrockMD;  [facility];  Laterality: Right;  . LIVER BIOPSY    . OTHER SURGICAL HISTORY Right    removal of ovarian cyst  . removal of uterine cyst  years ago  . RIGHT VATS W/ DRAINAGE PEURAL EFFUSION AND BX'S  10-30-2008    SOCIAL HISTORY: Social History   Social History  . Marital status: Single    Spouse name: N/A  . Number of children: 3  . Years of education: N/A   Social History Main Topics  . Smoking status: Never Smoker  . Smokeless tobacco: Never Used  . Alcohol use No  . Drug use: No  . Sexual activity: Not Currently    Birth control/ protection: Abstinence   Other Topics Concern  . Not on file   Social History Narrative  . No narrative on file    FAMILY HISTORY: Family History  Problem Relation Age of Onset  . Stomach cancer Mother   . Lung disease Father   . Asthma Father     ALLERGIES:  has No Known Allergies.  MEDICATIONS:  Current Outpatient Prescriptions  Medication Sig Dispense Refill   . acetaminophen (TYLENOL) 325 MG tablet Take 2 tablets (650 mg total) by mouth every 6 (six) hours as needed for mild pain (or Fever >/= 101). 30 tablet 0  . acyclovir (ZOVIRAX) 800 MG tablet Take 1 tablet (800 mg total) by mouth 2 (two) times daily. 60 tablet 2  . dexamethasone (DECADRON) 4 MG tablet Take 5 tablets (20 mg total) by mouth once a week. 40 tablet 1  . entecavir (BARACLUDE) 0.5 MG tablet Take 0.5 mg by mouth daily.    . feeding supplement (BOOST / RESOURCE BREEZE) LIQD Take 1 Container by mouth 3 (three) times daily between meals. 237 mL 0  . lidocaine-prilocaine (EMLA) cream Apply 1 application topically as directed.  0  . magic mouthwash w/lidocaine SOLN Take 15 mLs by mouth 4 (four) times daily as needed for mouth pain. 150 mL 0  . metFORMIN (GLUCOPHAGE) 500 MG tablet Take 1 tablet (500 mg total) by mouth 2 (two) times daily with a meal. 60 tablet 1  . omeprazole (PRILOSEC) 20 MG capsule Take 1 capsule (20 mg total) by mouth daily. 30 capsule 3  . ondansetron (ZOFRAN) 8 MG tablet Take 1 tablet (8 mg total) by mouth every 8 (eight) hours as needed for nausea or vomiting. 30 tablet 1  . promethazine (PHENERGAN) 25 MG tablet Take 1 tablet (25 mg total) by mouth every 6 (six) hours as needed for nausea or vomiting. 30 tablet 0   No current facility-administered medications for this visit.    Facility-Administered Medications Ordered in Other Visits  Medication Dose Route Frequency Provider Last Rate Last Dose  . sodium chloride 0.9 % injection 10 mL  10 mL Intravenous PRN Truitt Merle, MD   10 mL at 12/11/15 1532  . sodium chloride 0.9 % injection 10 mL  10 mL Intravenous PRN Truitt Merle, MD   10 mL at 06/11/16 1505  . sodium chloride flush (NS) 0.9 % injection 10 mL  10 mL Intravenous PRN Truitt Merle, MD   10 mL at 10/09/15 1717    REVIEW OF SYSTEMS:  Constitutional: no abnormal night sweats (+)improved fatigue (+)chills Eyes: Denies blurriness of vision, double vision or watery eyes    Ears, nose, mouth, throat, and face: Denies mucositis  Respiratory: Denies, dyspnea or wheezes  Cardiovascular: Denies palpitation, chest discomfort or lower extremity swelling Gastrointestinal:  Denies nausea, heartburn, reports regular bowel/bladder Skin: Denies abnormal skin rashes Lymphatics: Denies new lymphadenopathy or easy bruising Neurological:Denies numbness, tingling or new weaknesses Behavioral/Psych: Mood is stable, no new changes  All other systems  were reviewed with the patient and are negative.  PHYSICAL EXAMINATION:   ECOG PERFORMANCE STATUS: 1 Pressure 113/70, heart rate 85, respiratory rate 18, temperature 36.7, pulse ox 100% on room air GENERAL:alert, no distress and comfortable SKIN: skin color, texture, turgor are normal, no rashes or significant lesions EYES: normal, conjunctiva are pink and non-injected, sclera clear OROPHARYNX:no exudate, no erythema and lips, buccal mucosa NECK: supple, thyroid normal size, non-tender, without nodularity LYMPH:  no palpable lymphadenopathy in the cervical, axillary or inguinal LUNGS: clear to auscultation and percussion with normal breathing effort HEART: regular rate & rhythm and no murmurs and no lower extremity edema ABDOMEN:abdomen soft, mildly tender Musculoskeletal:no cyanosis of digits and no clubbing  PSYCH: alert & oriented x 3 with fluent speech NEURO: no focal motor/sensory deficits  LABORATORY DATA:  I have reviewed the data as listed CBC Latest Ref Rng & Units 08/05/2016 07/30/2016 07/23/2016  WBC 3.9 - 10.3 10e3/uL 4.2 5.5 4.7  Hemoglobin 11.6 - 15.9 g/dL 10.3(L) 10.2(L) 10.5(L)  Hematocrit 34.8 - 46.6 % 31.3(L) 33.0(L) 32.4(L)  Platelets 145 - 400 10e3/uL 175 170 191    CMP Latest Ref Rng & Units 08/05/2016 07/30/2016 07/23/2016  Glucose 70 - 140 mg/dl 90 75 91  BUN 7.0 - 26.0 mg/dL 14.2 8.7 13.4  Creatinine 0.6 - 1.1 mg/dL 0.7 0.6 0.6  Sodium 136 - 145 mEq/L 143 140 140  Potassium 3.5 - 5.1 mEq/L 4.0 3.9 3.8   Chloride 101 - 111 mmol/L - - -  CO2 22 - 29 mEq/L _0 Calcium 8.4 - 10.4 mg/dL 9.0 8.7 9.2  Total Protein 6.4 - 8.3 g/dL 6.4 6.2(L) 6.6  Total Bilirubin 0.20 - 1.20 mg/dL 0.26 0.23 0.28  Alkaline Phos 40 - 150 U/L 120 109 132  AST 5 - 34 U/L _1 ALT 0 - 55 U/L _2 SPEP M-protein  09/15/2014: 4.2 10/08/14: 4.6 12/08/2014: 0.2 02/15/2015: not sufficient sample for test   09/11/2015: not observed 10/09/2015: not det   11/06/2015: not det  12/25/2015: not det  02/13/2016: 0.3 03/12/2016: 0.8 04/09/2016: 0.6 05/08/16: 0.1 06/04/16: 0.1 07/02/2016: 0.2 07/09/2016: 0.2 (dara specific IFE negative)   IgG mg/dl 11/03/2014: 3150  12/08/2014:  759 02/14/2014: 860 09/11/2015: 1347 10/09/2015: 1457 11/06/2015: 1504 12/25/2015: 1400 02/13/2016: 1543 03/12/2016: 1860 04/09/2016: 1620 05/08/16: 749 06/04/16: 751 5/30/20187: 796 07/09/2016: 701  Kappa/lambda light chains levels and ration  11/03/14: 0.75, 152, 0.00 12/22/2014: 1.10, 2.60, 0.42 02/15/2015: 9.59,14.35, 0.67 09/11/2015: 2.44, 2.72, 0.90 10/09/2015: 3.09, 3.49, 0.89 11/06/2015: 2.30, 2.62, 0.88 12/25/2015: 2.51, 3.0, 0.84 02/13/2016: 2.64, 15.3, 0.17 03/12/2016: 2.27, 45.5, 0.05 04/09/2016: 3.33, 45.5, 0.07 05/08/2016: 0.49, 1.21, 0.40 06/04/2016: 0.83, 1.11, 0.75 07/02/16: 0.51, 1.43, 0.36   24 h urine UPEP/IFE and light chain: 11/03/2014: IFE showed a monoclonal IgG heavy chain with associated lambda light chain. M protein was Undetectable   PATHOLOGY REPORT  Bone Marrow (BM) and Peripheral Blood (PB) FINAL PATHOLOGIC DIAGNOSIS  BONE MARROW: 07/11/2015 Hypocellular marrow (20%) with no increase in plasma cells (2%). See comment.  PERIPHERAL BLOOD: Mild anemia. No circulating plasma cells identified. See comment and CBC data.  BONE MARROW: 02/22/2016 Diagnosis Bone Marrow, Aspirate,Biopsy, and Clot, right iliac - NORMOCELLULAR BONE MARROW FOR AGE WITH TRILINEAGE HEMATOPOIESIS. - PLASMACYTOSIS (PLASMA CELLS 12%). - SEE  COMMENT. PERIPHERAL BLOOD: - OCCASIONAL CIRCULATING PLASMA CELLS.  RADIOGRAPHIC STUDIES: I have personally reviewed the radiological images as listed and agreed with the findings in the report.  No results found.   CT Biopsy 02/22/16 IMPRESSION: 1. Technically successful CT guided right iliac bone core and aspiration biopsy.  ASSESSMENT & PLAN: 46 y.o. [place] woman, presented with anemia and leokocytosis   1. 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 Unfortunately this showed increased plasma cells 12% with additional lambda light chain staining pending. This is relapse of disease. Cytogenetics was normal  -Her M protein and lambda free light chain level has significantly increased lately, consistent with disease relapse -Her recent LP showed negative CSF, no evidence of CNS involvement. -I have previously spoken with Dr. Norma Fredrickson, we agreed to change her CyBorD to weekly, and add daratumumab weekly, starting today. Potential side effects would discussed with patient, especially cytopenia and infusion reaction, she will take dexamethasone the day before Dara infusion.  -If her disease does not respond to treatment quickly, or if she has intolerance issue to cyBorD, I will change to pomalidomide,  dexamethasone and Dara  -continue acyclovir  -Continue Zometa every 4 weeks for a total of 2 years (until 11/2017) -lab previously reviewed, adequate for treatment. - Due to her hospitalization for severe cytopenia and infection, Cytoxan has been held. -she has restarted weekly Velcade and dexa, and now on Dara every 2 weeks, tolerating well, will continue. Her recent dara-specific IFE was negative for  M-protein on 07/07/16 at [facility], she has achieved second CR -Today labs reviewed and adequate to continue treatment   - Due to trip, I will change her treatments - continue Velcade injection and dexa 45m weekly, and Dara infusion every 2 weeks. In Early Sep we will change infusion to once a month  - she is responding to treatment well. -She is clinically doing well, exam was unremarkable, lab results reviewed with her.  2. Type 2 diabetes, steroids induced  -She was noticed to have increased blood glucose level lately, with random blood glucose above 200. She also developed diabetic symptoms. No history of diabetes in the past. -This is likely steroids induced hyperglycemia -I strongly recommend her to avoid any soft drink and sweats, and watch her carbohydrate intake, and exercise more  -She has been seen by her primary care physician. She was not given any medication to manage her glucose. I will call her primary care physician to discuss adding metformin. -I previously instructed her to take metformin every day since she is now taking dexamethasone more frequently.  -She restarted her dexamethasone weekly as part of his leukemia treatment, and she will also receive steroids as premedication, I recommend her to take metformin 500 mg twice daily continuously, to control her hyperglycemia.Potential side effects discussed, she agreed to proceed. -Continue close monitoring. -We previously discussed restarting to metformin one pill a day while on steroids to see if her levels will stabilize. I previously encouraged her to take it in the morning with breakfast.    3. Chronic Hepatitis B  - [facility] ent[facility], but she run out 2 weeks ago and has not refilled due to high copay.  Our nursing staff will call her GI physician's office to request this for her. - [facility]  4. GERD, recent  GI bleeding  and nausea  -Continue omeprazole daily. We previously discussed steroids can worsen her acid reflux. -She previously had an EGD at [facility] in 2017.  -- she did not do her colonoscopy because she did not have anyone to accompany her during visit. They will call to reschedule.   5. Constipation -I previously encouraged the patient to try senca-s, ducalax 1-3 times a day or milk of magnesium if miralax doresn't help with constipation  - I previously advised the patient to take something everyday to help with constipation.   Plan  - no refills needed today - I will chedule treatments -Continue Velcade injection weekly, increase dexa from 6m to 49mweekly, continue dara infusion every 2 weeks -f/u in 4 weeks   All questions were answered. The patient knows to call the clinic with any problems, questions or concerns.  I spent 20 minutes counseling the patient face to face. We used the vedio interpreter service. The total time spent in the appointment was 25 minutes and more than 50% was on counseling.  This document serves as a record of services personally performed by YaTruitt MerleMD. It was created on her behalf by TaBrandt Loosena trained medical scribe. The creation of this record is based on the scribe's personal observations and the provider's statements to them. This document has been checked and approved by the attending provider.    FeTruitt MerleMD 08/05/2016

## 2016-08-05 ENCOUNTER — Ambulatory Visit (HOSPITAL_BASED_OUTPATIENT_CLINIC_OR_DEPARTMENT_OTHER): Payer: Medicaid Other

## 2016-08-05 ENCOUNTER — Ambulatory Visit (HOSPITAL_BASED_OUTPATIENT_CLINIC_OR_DEPARTMENT_OTHER): Payer: Medicaid Other | Admitting: Hematology

## 2016-08-05 ENCOUNTER — Other Ambulatory Visit (HOSPITAL_BASED_OUTPATIENT_CLINIC_OR_DEPARTMENT_OTHER): Payer: Medicaid Other

## 2016-08-05 VITALS — BP 102/56 | HR 91 | Temp 97.8°F | Resp 18 | Wt 126.5 lb

## 2016-08-05 DIAGNOSIS — Z5112 Encounter for antineoplastic immunotherapy: Secondary | ICD-10-CM

## 2016-08-05 DIAGNOSIS — B181 Chronic viral hepatitis B without delta-agent: Secondary | ICD-10-CM

## 2016-08-05 DIAGNOSIS — C9011 Plasma cell leukemia in remission: Secondary | ICD-10-CM

## 2016-08-05 DIAGNOSIS — E099 Drug or chemical induced diabetes mellitus without complications: Secondary | ICD-10-CM

## 2016-08-05 DIAGNOSIS — Z95828 Presence of other vascular implants and grafts: Secondary | ICD-10-CM

## 2016-08-05 DIAGNOSIS — C9012 Plasma cell leukemia in relapse: Secondary | ICD-10-CM

## 2016-08-05 DIAGNOSIS — C9010 Plasma cell leukemia not having achieved remission: Secondary | ICD-10-CM

## 2016-08-05 DIAGNOSIS — K922 Gastrointestinal hemorrhage, unspecified: Secondary | ICD-10-CM

## 2016-08-05 LAB — COMPREHENSIVE METABOLIC PANEL
ALBUMIN: 3.4 g/dL — AB (ref 3.5–5.0)
ALK PHOS: 120 U/L (ref 40–150)
ALT: 11 U/L (ref 0–55)
AST: 23 U/L (ref 5–34)
Anion Gap: 9 mEq/L (ref 3–11)
BILIRUBIN TOTAL: 0.26 mg/dL (ref 0.20–1.20)
BUN: 14.2 mg/dL (ref 7.0–26.0)
CALCIUM: 9 mg/dL (ref 8.4–10.4)
CHLORIDE: 109 meq/L (ref 98–109)
CO2: 25 mEq/L (ref 22–29)
CREATININE: 0.7 mg/dL (ref 0.6–1.1)
EGFR: >90 mL/min/{1.73_m2} (ref >90)
Glucose: 90 mg/dl (ref 70–140)
Potassium: 4 mEq/L (ref 3.5–5.1)
Sodium: 143 mEq/L (ref 136–145)
Total Protein: 6.4 g/dL (ref 6.4–8.3)

## 2016-08-05 LAB — CBC WITH DIFFERENTIAL/PLATELET
BASO%: 0.3 % (ref 0.0–2.0)
Basophils Absolute: 0 10*3/uL (ref 0.0–0.1)
EOS%: 0.9 % (ref 0.0–7.0)
Eosinophils Absolute: 0 10*3/uL (ref 0.0–0.5)
HEMATOCRIT: 31.3 % — AB (ref 34.8–46.6)
HEMOGLOBIN: 10.3 g/dL — AB (ref 11.6–15.9)
LYMPH#: 0.9 10*3/uL (ref 0.9–3.3)
LYMPH%: 21.4 % (ref 14.0–49.7)
MCH: 28.3 pg (ref 25.1–34.0)
MCHC: 32.9 g/dL (ref 31.5–36.0)
MCV: 86 fL (ref 79.5–101.0)
MONO#: 0.6 10*3/uL (ref 0.1–0.9)
MONO%: 13.5 % (ref 0.0–14.0)
NEUT#: 2.7 10*3/uL (ref 1.5–6.5)
NEUT%: 63.9 % (ref 38.4–76.8)
PLATELETS: 175 10*3/uL (ref 145–400)
RBC: 3.64 10*6/uL — ABNORMAL LOW (ref 3.70–5.45)
RDW: 14.5 % (ref 11.2–14.5)
WBC: 4.2 10*3/uL (ref 3.9–10.3)

## 2016-08-05 MED ORDER — BORTEZOMIB CHEMO SQ INJECTION 3.5 MG (2.5MG/ML)
1.5000 mg/m2 | Freq: Once | INTRAMUSCULAR | Status: AC
  Administered 2016-08-05: 2.5 mg via SUBCUTANEOUS
  Filled 2016-08-05: qty 2.5

## 2016-08-05 MED ORDER — PROCHLORPERAZINE MALEATE 10 MG PO TABS
ORAL_TABLET | ORAL | Status: AC
  Filled 2016-08-05: qty 1

## 2016-08-05 MED ORDER — SODIUM CHLORIDE 0.9% FLUSH
10.0000 mL | INTRAVENOUS | Status: DC | PRN
  Administered 2016-08-05: 10 mL
  Filled 2016-08-05: qty 10

## 2016-08-05 MED ORDER — ACETAMINOPHEN 325 MG PO TABS
650.0000 mg | ORAL_TABLET | Freq: Once | ORAL | Status: AC
  Administered 2016-08-05: 650 mg via ORAL

## 2016-08-05 MED ORDER — ACETAMINOPHEN 325 MG PO TABS
ORAL_TABLET | ORAL | Status: AC
  Filled 2016-08-05: qty 2

## 2016-08-05 MED ORDER — DIPHENHYDRAMINE HCL 25 MG PO CAPS
25.0000 mg | ORAL_CAPSULE | Freq: Once | ORAL | Status: AC
  Administered 2016-08-05: 25 mg via ORAL

## 2016-08-05 MED ORDER — METHYLPREDNISOLONE SODIUM SUCC 125 MG IJ SOLR
INTRAMUSCULAR | Status: AC
  Filled 2016-08-05: qty 2

## 2016-08-05 MED ORDER — METHYLPREDNISOLONE SODIUM SUCC 125 MG IJ SOLR
125.0000 mg | Freq: Once | INTRAMUSCULAR | Status: AC
  Administered 2016-08-05: 125 mg via INTRAVENOUS

## 2016-08-05 MED ORDER — PROCHLORPERAZINE MALEATE 10 MG PO TABS
10.0000 mg | ORAL_TABLET | Freq: Once | ORAL | Status: AC
  Administered 2016-08-05: 10 mg via ORAL

## 2016-08-05 MED ORDER — DEXAMETHASONE 4 MG PO TABS: 20.0000 mg | ORAL_TABLET | ORAL | 1 refills | Status: DC

## 2016-08-05 MED ORDER — DIPHENHYDRAMINE HCL 25 MG PO CAPS
ORAL_CAPSULE | ORAL | Status: AC
  Filled 2016-08-05: qty 2

## 2016-08-05 MED ORDER — SODIUM CHLORIDE 0.9 % IV SOLN
Freq: Once | INTRAVENOUS | Status: AC
  Administered 2016-08-05: 09:00:00 via INTRAVENOUS

## 2016-08-05 MED ORDER — SODIUM CHLORIDE 0.9 % IV SOLN
16.0000 mg/kg | Freq: Once | INTRAVENOUS | Status: AC
  Administered 2016-08-05: 900 mg via INTRAVENOUS
  Filled 2016-08-05: qty 40

## 2016-08-05 MED ORDER — ZOLEDRONIC ACID 4 MG/100ML IV SOLN
4.0000 mg | Freq: Once | INTRAVENOUS | Status: AC
  Administered 2016-08-05: 4 mg via INTRAVENOUS
  Filled 2016-08-05: qty 100

## 2016-08-05 MED ORDER — DIPHENHYDRAMINE HCL 25 MG PO CAPS: 50.0000 mg | ORAL_CAPSULE | Freq: Once | ORAL | Status: DC

## 2016-08-05 MED ORDER — HEPARIN SOD (PORK) LOCK FLUSH 100 UNIT/ML IV SOLN
500.0000 [IU] | Freq: Once | INTRAVENOUS | Status: AC | PRN
  Administered 2016-08-05: 500 [IU]
  Filled 2016-08-05: qty 5

## 2016-08-05 NOTE — Patient Instructions (Signed)
[facility] Discharge Instructions for Patients Receiving Chemotherapy  Today you received the following chemotherapy agents Darzalex and Velcade   To help prevent nausea and vomiting after your treatment, we encourage you to take your nausea medication as directed.    If you develop nausea and vomiting that is not controlled by your nausea medication, call the clinic.   BELOW ARE SYMPTOMS THAT SHOULD BE REPORTED IMMEDIATELY:  *FEVER GREATER THAN 100.5 F  *CHILLS WITH OR WITHOUT FEVER  NAUSEA AND VOMITING THAT IS NOT CONTROLLED WITH YOUR NAUSEA MEDICATION  *UNUSUAL SHORTNESS OF BREATH  *UNUSUAL BRUISING OR BLEEDING  TENDERNESS IN MOUTH AND THROAT WITH OR WITHOUT PRESENCE OF ULCERS  *URINARY PROBLEMS  *BOWEL PROBLEMS  UNUSUAL RASH Items with * indicate a potential emergency and should be followed up as soon as possible.  Feel free to call the clinic you have any questions or concerns. The clinic phone number is [phone].  Please show the CHEMO ALERT CARD at check-in to the Emergency Department and triage nurse.   

## 2016-08-06 ENCOUNTER — Encounter: Payer: Self-pay | Admitting: Hematology

## 2016-08-07 LAB — PROTEIN ELECTROPHORESIS, SERUM
A/G Ratio: 1.2 (ref 0.7–1.7)
ALPHA 1: 0.3 g/dL (ref 0.0–0.4)
Albumin: 3.4 g/dL (ref 2.9–4.4)
Alpha 2: 1 g/dL (ref 0.4–1.0)
Beta: 1 g/dL (ref 0.7–1.3)
GAMMA GLOBULIN: 0.6 g/dL (ref 0.4–1.8)
GLOBULIN, TOTAL: 2.9 g/dL (ref 2.2–3.9)
TOTAL PROTEIN: 6.3 g/dL (ref 6.0–8.5)

## 2016-08-07 LAB — KAPPA/LAMBDA LIGHT CHAINS
IG KAPPA FREE LIGHT CHAIN: 6.2 mg/L (ref 3.3–19.4)
Ig Lambda Free Light Chain: 10.9 mg/L (ref 5.7–26.3)
Kappa/Lambda FluidC Ratio: 0.57 (ref 0.26–1.65)

## 2016-08-09 LAB — IFE, DARA-SPECIFIC, SERUM
IGG (IMMUNOGLOBIN G), SERUM: 678 mg/dL — AB (ref 700–1600)
IGM (IMMUNOGLOBIN M), SRM: 27 mg/dL (ref 26–217)
IgA, Qn, Serum: 33 mg/dL — ABNORMAL LOW (ref 87–352)

## 2016-08-12 ENCOUNTER — Telehealth: Payer: Self-pay | Admitting: Hematology

## 2016-08-12 ENCOUNTER — Ambulatory Visit (HOSPITAL_BASED_OUTPATIENT_CLINIC_OR_DEPARTMENT_OTHER): Payer: Medicaid Other

## 2016-08-12 VITALS — BP 112/62 | HR 77 | Temp 98.3°F | Resp 18

## 2016-08-12 DIAGNOSIS — C9010 Plasma cell leukemia not having achieved remission: Secondary | ICD-10-CM

## 2016-08-12 MED ORDER — BORTEZOMIB CHEMO SQ INJECTION 3.5 MG (2.5MG/ML)
1.5000 mg/m2 | Freq: Once | INTRAMUSCULAR | Status: AC
Start: 2016-08-12 — End: 2016-08-12
  Administered 2016-08-12: 2.5 mg via SUBCUTANEOUS
  Filled 2016-08-12: qty 2.5

## 2016-08-12 MED ORDER — PROCHLORPERAZINE MALEATE 10 MG PO TABS
10.0000 mg | ORAL_TABLET | Freq: Once | ORAL | Status: AC
  Administered 2016-08-12: 10 mg via ORAL

## 2016-08-12 MED ORDER — SODIUM CHLORIDE 0.9 % IV SOLN
Freq: Once | INTRAVENOUS | Status: DC
Start: 2016-08-12 — End: 2016-08-12

## 2016-08-12 MED ORDER — PROCHLORPERAZINE MALEATE 10 MG PO TABS
ORAL_TABLET | ORAL | Status: AC
  Filled 2016-08-12: qty 1

## 2016-08-12 NOTE — Telephone Encounter (Signed)
Patient presented to scheduling for appointment today. Appointment not on scheduled. Ok to add infusion per Camera operator. Remaining appointments scheduled according to 7/3 los.

## 2016-08-12 NOTE — Patient Instructions (Signed)
[facility] Discharge Instructions for Patients Receiving Chemotherapy  Today you received the following chemotherapy agents Velcade. To help prevent nausea and vomiting after your treatment, we encourage you to take your nausea medication as directed.  If you develop nausea and vomiting that is not controlled by your nausea medication, call the clinic.   BELOW ARE SYMPTOMS THAT SHOULD BE REPORTED IMMEDIATELY:  *FEVER GREATER THAN 100.5 F  *CHILLS WITH OR WITHOUT FEVER  NAUSEA AND VOMITING THAT IS NOT CONTROLLED WITH YOUR NAUSEA MEDICATION  *UNUSUAL SHORTNESS OF BREATH  *UNUSUAL BRUISING OR BLEEDING  TENDERNESS IN MOUTH AND THROAT WITH OR WITHOUT PRESENCE OF ULCERS  *URINARY PROBLEMS  *BOWEL PROBLEMS  UNUSUAL RASH Items with * indicate a potential emergency and should be followed up as soon as possible.  Feel free to call the clinic you have any questions or concerns. The clinic phone number is [phone].  Please show the CHEMO ALERT CARD at check-in to the Emergency Department and triage nurse.    

## 2016-08-12 NOTE — Progress Notes (Signed)
Per Dr. Burr Medico okay to treat with 08/05/16 labs.

## 2016-08-12 NOTE — Telephone Encounter (Signed)
Patient to be given new schedule in infusion area.

## 2016-08-14 ENCOUNTER — Telehealth: Payer: Self-pay

## 2016-08-14 NOTE — Telephone Encounter (Signed)
The pt has been scheduled for ROV, video interpreter to be used.  Message left with appt info.

## 2016-08-14 NOTE — Telephone Encounter (Signed)
-----   Message from Milus Banister, MD sent at 08/14/2016  7:20 AM EDT ----- Burnis Medin reach out to her   Vasil Juhasz, She cancelled her previously scheduled colonoscopy. Can you arrange rov with her, next available to discuss symptoms, consider rescheduling. Needs vietnamese interpreter  Thanks   ----- Message ----- From: Truitt Merle, MD Sent: 08/14/2016   7:13 AM To: Milus Banister, MD  Dan,  I referred her to you before for rectal bleeding but she did not make it. She now has recurrent rectal bleeding, could you see her back? She has plasma cell leukemia, in remission, still on chemotherapy (biologic agents), blood counts are decent. You previously did her EGD for gastric symptoms.  She is from [place], needs an interpreter.  Thanks much,  Krista Blue

## 2016-08-19 ENCOUNTER — Other Ambulatory Visit: Payer: Self-pay | Admitting: Hematology

## 2016-08-19 ENCOUNTER — Other Ambulatory Visit (HOSPITAL_BASED_OUTPATIENT_CLINIC_OR_DEPARTMENT_OTHER): Payer: Medicaid Other

## 2016-08-19 ENCOUNTER — Ambulatory Visit (HOSPITAL_BASED_OUTPATIENT_CLINIC_OR_DEPARTMENT_OTHER): Payer: Medicaid Other

## 2016-08-19 VITALS — BP 105/69 | HR 74 | Temp 97.8°F | Resp 16

## 2016-08-19 DIAGNOSIS — C9012 Plasma cell leukemia in relapse: Secondary | ICD-10-CM

## 2016-08-19 DIAGNOSIS — C9010 Plasma cell leukemia not having achieved remission: Secondary | ICD-10-CM

## 2016-08-19 DIAGNOSIS — Z5112 Encounter for antineoplastic immunotherapy: Secondary | ICD-10-CM | POA: Diagnosis not present

## 2016-08-19 DIAGNOSIS — C9011 Plasma cell leukemia in remission: Secondary | ICD-10-CM

## 2016-08-19 LAB — CBC WITH DIFFERENTIAL/PLATELET
BASO%: 0.2 % (ref 0.0–2.0)
Basophils Absolute: 0 10*3/uL (ref 0.0–0.1)
EOS%: 0.5 % (ref 0.0–7.0)
Eosinophils Absolute: 0 10*3/uL (ref 0.0–0.5)
HCT: 34.7 % — ABNORMAL LOW (ref 34.8–46.6)
HGB: 10.7 g/dL — ABNORMAL LOW (ref 11.6–15.9)
LYMPH%: 20.4 % (ref 14.0–49.7)
MCH: 27.3 pg (ref 25.1–34.0)
MCHC: 30.8 g/dL — ABNORMAL LOW (ref 31.5–36.0)
MCV: 88.5 fL (ref 79.5–101.0)
MONO#: 0.6 10*3/uL (ref 0.1–0.9)
MONO%: 13.6 % (ref 0.0–14.0)
NEUT#: 2.7 10*3/uL (ref 1.5–6.5)
NEUT%: 65.3 % (ref 38.4–76.8)
Platelets: 179 10*3/uL (ref 145–400)
RBC: 3.92 10*6/uL (ref 3.70–5.45)
RDW: 14.1 % (ref 11.2–14.5)
WBC: 4.1 10*3/uL (ref 3.9–10.3)
lymph#: 0.8 10*3/uL — ABNORMAL LOW (ref 0.9–3.3)

## 2016-08-19 LAB — COMPREHENSIVE METABOLIC PANEL
ALT: 11 U/L (ref 0–55)
AST: 23 U/L (ref 5–34)
Albumin: 3.4 g/dL — ABNORMAL LOW (ref 3.5–5.0)
Alkaline Phosphatase: 131 U/L (ref 40–150)
Anion Gap: 12 mEq/L — ABNORMAL HIGH (ref 3–11)
BUN: 12.5 mg/dL (ref 7.0–26.0)
CO2: 22 mEq/L (ref 22–29)
Calcium: 8.9 mg/dL (ref 8.4–10.4)
Chloride: 109 mEq/L (ref 98–109)
Creatinine: 0.7 mg/dL (ref 0.6–1.1)
EGFR: >90 mL/min/{1.73_m2} (ref >90)
Glucose: 90 mg/dl (ref 70–140)
Potassium: 4 mEq/L (ref 3.5–5.1)
Sodium: 143 mEq/L (ref 136–145)
Total Bilirubin: 0.23 mg/dL (ref 0.20–1.20)
Total Protein: 6.4 g/dL (ref 6.4–8.3)

## 2016-08-19 MED ORDER — OMEPRAZOLE 20 MG PO CPDR: 20.0000 mg | DELAYED_RELEASE_CAPSULE | Freq: Every day | ORAL | 3 refills | Status: DC

## 2016-08-19 MED ORDER — ACETAMINOPHEN 325 MG PO TABS
650.0000 mg | ORAL_TABLET | Freq: Once | ORAL | Status: AC
  Administered 2016-08-19: 650 mg via ORAL

## 2016-08-19 MED ORDER — BORTEZOMIB CHEMO SQ INJECTION 3.5 MG (2.5MG/ML)
1.5000 mg/m2 | Freq: Once | INTRAMUSCULAR | Status: AC
  Administered 2016-08-19: 2.5 mg via SUBCUTANEOUS
  Filled 2016-08-19: qty 2.5

## 2016-08-19 MED ORDER — ACETAMINOPHEN 325 MG PO TABS
ORAL_TABLET | ORAL | Status: AC
  Filled 2016-08-19: qty 2

## 2016-08-19 MED ORDER — SODIUM CHLORIDE 0.9 % IV SOLN
16.0000 mg/kg | Freq: Once | INTRAVENOUS | Status: AC
  Administered 2016-08-19: 900 mg via INTRAVENOUS
  Filled 2016-08-19: qty 40

## 2016-08-19 MED ORDER — PROCHLORPERAZINE MALEATE 10 MG PO TABS
ORAL_TABLET | ORAL | Status: AC
  Filled 2016-08-19: qty 1

## 2016-08-19 MED ORDER — PROCHLORPERAZINE MALEATE 10 MG PO TABS
10.0000 mg | ORAL_TABLET | Freq: Once | ORAL | Status: AC
  Administered 2016-08-19: 10 mg via ORAL

## 2016-08-19 MED ORDER — DIPHENHYDRAMINE HCL 25 MG PO CAPS
ORAL_CAPSULE | ORAL | Status: AC
  Filled 2016-08-19: qty 1

## 2016-08-19 MED ORDER — METFORMIN HCL 500 MG PO TABS: 500.0000 mg | ORAL_TABLET | Freq: Two times a day (BID) | ORAL | 2 refills | Status: DC

## 2016-08-19 MED ORDER — METHYLPREDNISOLONE SODIUM SUCC 125 MG IJ SOLR
125.0000 mg | Freq: Once | INTRAMUSCULAR | Status: AC
  Administered 2016-08-19: 125 mg via INTRAVENOUS

## 2016-08-19 MED ORDER — METHYLPREDNISOLONE SODIUM SUCC 125 MG IJ SOLR
INTRAMUSCULAR | Status: AC
  Filled 2016-08-19: qty 2

## 2016-08-19 MED ORDER — DIPHENHYDRAMINE HCL 25 MG PO CAPS
25.0000 mg | ORAL_CAPSULE | Freq: Once | ORAL | Status: AC
  Administered 2016-08-19: 25 mg via ORAL

## 2016-08-19 NOTE — Patient Instructions (Signed)
[facility] Discharge Instructions for Patients Receiving Chemotherapy  Today you received the following chemotherapy agents Darzalex and Velcade   To help prevent nausea and vomiting after your treatment, we encourage you to take your nausea medication as directed.    If you develop nausea and vomiting that is not controlled by your nausea medication, call the clinic.   BELOW ARE SYMPTOMS THAT SHOULD BE REPORTED IMMEDIATELY:  *FEVER GREATER THAN 100.5 F  *CHILLS WITH OR WITHOUT FEVER  NAUSEA AND VOMITING THAT IS NOT CONTROLLED WITH YOUR NAUSEA MEDICATION  *UNUSUAL SHORTNESS OF BREATH  *UNUSUAL BRUISING OR BLEEDING  TENDERNESS IN MOUTH AND THROAT WITH OR WITHOUT PRESENCE OF ULCERS  *URINARY PROBLEMS  *BOWEL PROBLEMS  UNUSUAL RASH Items with * indicate a potential emergency and should be followed up as soon as possible.  Feel free to call the clinic you have any questions or concerns. The clinic phone number is [phone].  Please show the CHEMO ALERT CARD at check-in to the Emergency Department and triage nurse.   

## 2016-08-26 ENCOUNTER — Ambulatory Visit (HOSPITAL_BASED_OUTPATIENT_CLINIC_OR_DEPARTMENT_OTHER): Payer: Medicaid Other

## 2016-08-26 VITALS — BP 99/72 | HR 85 | Temp 97.8°F | Resp 18

## 2016-08-26 DIAGNOSIS — C9010 Plasma cell leukemia not having achieved remission: Secondary | ICD-10-CM

## 2016-08-26 DIAGNOSIS — Z5112 Encounter for antineoplastic immunotherapy: Secondary | ICD-10-CM | POA: Diagnosis not present

## 2016-08-26 DIAGNOSIS — C9012 Plasma cell leukemia in relapse: Secondary | ICD-10-CM

## 2016-08-26 LAB — CBC WITH DIFFERENTIAL/PLATELET
BASO%: 0.2 % (ref 0.0–2.0)
BASOS ABS: 0 10*3/uL (ref 0.0–0.1)
EOS ABS: 0.1 10*3/uL (ref 0.0–0.5)
EOS%: 1 % (ref 0.0–7.0)
HEMATOCRIT: 32.9 % — AB (ref 34.8–46.6)
HEMOGLOBIN: 10.3 g/dL — AB (ref 11.6–15.9)
LYMPH%: 17.2 % (ref 14.0–49.7)
MCH: 27.5 pg (ref 25.1–34.0)
MCHC: 31.3 g/dL — AB (ref 31.5–36.0)
MCV: 87.7 fL (ref 79.5–101.0)
MONO#: 0.7 10*3/uL (ref 0.1–0.9)
MONO%: 14.6 % — ABNORMAL HIGH (ref 0.0–14.0)
NEUT#: 3.4 10*3/uL (ref 1.5–6.5)
NEUT%: 67 % (ref 38.4–76.8)
PLATELETS: 187 10*3/uL (ref 145–400)
RBC: 3.75 10*6/uL (ref 3.70–5.45)
RDW: 13.9 % (ref 11.2–14.5)
WBC: 5.1 10*3/uL (ref 3.9–10.3)
lymph#: 0.9 10*3/uL (ref 0.9–3.3)

## 2016-08-26 LAB — COMPREHENSIVE METABOLIC PANEL
ALBUMIN: 3.4 g/dL — AB (ref 3.5–5.0)
ALK PHOS: 129 U/L (ref 40–150)
ALT: 12 U/L (ref 0–55)
ANION GAP: 8 meq/L (ref 3–11)
AST: 23 U/L (ref 5–34)
BUN: 14.6 mg/dL (ref 7.0–26.0)
CALCIUM: 9 mg/dL (ref 8.4–10.4)
CHLORIDE: 108 meq/L (ref 98–109)
CO2: 24 mEq/L (ref 22–29)
Creatinine: 0.7 mg/dL (ref 0.6–1.1)
Glucose: 119 mg/dl (ref 70–140)
POTASSIUM: 4 meq/L (ref 3.5–5.1)
Sodium: 140 mEq/L (ref 136–145)
Total Bilirubin: <0.22 mg/dL (ref 0.20–1.20)
Total Protein: 6.5 g/dL (ref 6.4–8.3)

## 2016-08-26 MED ORDER — BORTEZOMIB CHEMO SQ INJECTION 3.5 MG (2.5MG/ML)
1.5000 mg/m2 | Freq: Once | INTRAMUSCULAR | Status: AC
  Administered 2016-08-26: 2.5 mg via SUBCUTANEOUS
  Filled 2016-08-26: qty 2.5

## 2016-08-26 MED ORDER — PROCHLORPERAZINE MALEATE 10 MG PO TABS
10.0000 mg | ORAL_TABLET | Freq: Once | ORAL | Status: AC
  Administered 2016-08-26: 10 mg via ORAL

## 2016-08-26 MED ORDER — PROCHLORPERAZINE MALEATE 10 MG PO TABS
ORAL_TABLET | ORAL | Status: AC
  Filled 2016-08-26: qty 1

## 2016-08-26 NOTE — Patient Instructions (Signed)
[facility] Discharge Instructions for Patients Receiving Chemotherapy  Today you received the following chemotherapy agents Velcade. To help prevent nausea and vomiting after your treatment, we encourage you to take your nausea medication as directed.  If you develop nausea and vomiting that is not controlled by your nausea medication, call the clinic.   BELOW ARE SYMPTOMS THAT SHOULD BE REPORTED IMMEDIATELY:  *FEVER GREATER THAN 100.5 F  *CHILLS WITH OR WITHOUT FEVER  NAUSEA AND VOMITING THAT IS NOT CONTROLLED WITH YOUR NAUSEA MEDICATION  *UNUSUAL SHORTNESS OF BREATH  *UNUSUAL BRUISING OR BLEEDING  TENDERNESS IN MOUTH AND THROAT WITH OR WITHOUT PRESENCE OF ULCERS  *URINARY PROBLEMS  *BOWEL PROBLEMS  UNUSUAL RASH Items with * indicate a potential emergency and should be followed up as soon as possible.  Feel free to call the clinic you have any questions or concerns. The clinic phone number is [phone].  Please show the CHEMO ALERT CARD at check-in to the Emergency Department and triage nurse.    

## 2016-09-01 ENCOUNTER — Encounter: Payer: Self-pay | Admitting: Hematology

## 2016-09-01 ENCOUNTER — Ambulatory Visit: Payer: Medicaid Other

## 2016-09-01 ENCOUNTER — Other Ambulatory Visit (HOSPITAL_BASED_OUTPATIENT_CLINIC_OR_DEPARTMENT_OTHER): Payer: Medicaid Other

## 2016-09-01 ENCOUNTER — Ambulatory Visit (HOSPITAL_BASED_OUTPATIENT_CLINIC_OR_DEPARTMENT_OTHER): Payer: Medicaid Other | Admitting: Hematology

## 2016-09-01 ENCOUNTER — Ambulatory Visit (HOSPITAL_BASED_OUTPATIENT_CLINIC_OR_DEPARTMENT_OTHER): Payer: Medicaid Other

## 2016-09-01 ENCOUNTER — Telehealth: Payer: Self-pay | Admitting: Hematology

## 2016-09-01 VITALS — BP 99/59 | HR 76 | Temp 97.7°F | Resp 18

## 2016-09-01 VITALS — BP 107/83 | HR 73 | Temp 98.0°F | Resp 18 | Ht 62.0 in | Wt 128.1 lb

## 2016-09-01 DIAGNOSIS — R630 Anorexia: Secondary | ICD-10-CM | POA: Diagnosis not present

## 2016-09-01 DIAGNOSIS — R109 Unspecified abdominal pain: Secondary | ICD-10-CM

## 2016-09-01 DIAGNOSIS — C9012 Plasma cell leukemia in relapse: Secondary | ICD-10-CM

## 2016-09-01 DIAGNOSIS — K5900 Constipation, unspecified: Secondary | ICD-10-CM

## 2016-09-01 DIAGNOSIS — K219 Gastro-esophageal reflux disease without esophagitis: Secondary | ICD-10-CM

## 2016-09-01 DIAGNOSIS — D649 Anemia, unspecified: Secondary | ICD-10-CM | POA: Diagnosis not present

## 2016-09-01 DIAGNOSIS — T380X5A Adverse effect of glucocorticoids and synthetic analogues, initial encounter: Secondary | ICD-10-CM

## 2016-09-01 DIAGNOSIS — C9011 Plasma cell leukemia in remission: Secondary | ICD-10-CM

## 2016-09-01 DIAGNOSIS — K922 Gastrointestinal hemorrhage, unspecified: Secondary | ICD-10-CM | POA: Diagnosis not present

## 2016-09-01 DIAGNOSIS — R110 Nausea: Secondary | ICD-10-CM | POA: Diagnosis not present

## 2016-09-01 DIAGNOSIS — E099 Drug or chemical induced diabetes mellitus without complications: Secondary | ICD-10-CM | POA: Diagnosis not present

## 2016-09-01 DIAGNOSIS — B181 Chronic viral hepatitis B without delta-agent: Secondary | ICD-10-CM | POA: Diagnosis not present

## 2016-09-01 DIAGNOSIS — R634 Abnormal weight loss: Secondary | ICD-10-CM | POA: Diagnosis not present

## 2016-09-01 DIAGNOSIS — R5383 Other fatigue: Secondary | ICD-10-CM | POA: Diagnosis not present

## 2016-09-01 DIAGNOSIS — C9010 Plasma cell leukemia not having achieved remission: Secondary | ICD-10-CM

## 2016-09-01 DIAGNOSIS — Z5111 Encounter for antineoplastic chemotherapy: Secondary | ICD-10-CM | POA: Diagnosis not present

## 2016-09-01 DIAGNOSIS — Z5112 Encounter for antineoplastic immunotherapy: Secondary | ICD-10-CM

## 2016-09-01 DIAGNOSIS — Z95828 Presence of other vascular implants and grafts: Secondary | ICD-10-CM

## 2016-09-01 LAB — CBC WITH DIFFERENTIAL/PLATELET
BASO%: 0.2 % (ref 0.0–2.0)
BASOS ABS: 0 10*3/uL (ref 0.0–0.1)
EOS ABS: 0 10*3/uL (ref 0.0–0.5)
EOS%: 0.5 % (ref 0.0–7.0)
HCT: 32.6 % — ABNORMAL LOW (ref 34.8–46.6)
HEMOGLOBIN: 9.8 g/dL — AB (ref 11.6–15.9)
LYMPH%: 17.4 % (ref 14.0–49.7)
MCH: 26.6 pg (ref 25.1–34.0)
MCHC: 30.1 g/dL — ABNORMAL LOW (ref 31.5–36.0)
MCV: 88.3 fL (ref 79.5–101.0)
MONO#: 0.6 10*3/uL (ref 0.1–0.9)
MONO%: 13.1 % (ref 0.0–14.0)
NEUT#: 3.1 10*3/uL (ref 1.5–6.5)
NEUT%: 68.8 % (ref 38.4–76.8)
Platelets: 177 10*3/uL (ref 145–400)
RBC: 3.69 10*6/uL — AB (ref 3.70–5.45)
RDW: 13.9 % (ref 11.2–14.5)
WBC: 4.4 10*3/uL (ref 3.9–10.3)
lymph#: 0.8 10*3/uL — ABNORMAL LOW (ref 0.9–3.3)

## 2016-09-01 LAB — COMPREHENSIVE METABOLIC PANEL
ALBUMIN: 3.4 g/dL — AB (ref 3.5–5.0)
ALK PHOS: 111 U/L (ref 40–150)
ALT: 9 U/L (ref 0–55)
AST: 25 U/L (ref 5–34)
Anion Gap: 8 mEq/L (ref 3–11)
BUN: 12.1 mg/dL (ref 7.0–26.0)
CO2: 24 mEq/L (ref 22–29)
Calcium: 8.8 mg/dL (ref 8.4–10.4)
Chloride: 109 mEq/L (ref 98–109)
Creatinine: 0.6 mg/dL (ref 0.6–1.1)
GLUCOSE: 91 mg/dL (ref 70–140)
POTASSIUM: 4 meq/L (ref 3.5–5.1)
SODIUM: 141 meq/L (ref 136–145)
Total Bilirubin: 0.22 mg/dL (ref 0.20–1.20)
Total Protein: 6.3 g/dL — ABNORMAL LOW (ref 6.4–8.3)

## 2016-09-01 MED ORDER — METHYLPREDNISOLONE SODIUM SUCC 125 MG IJ SOLR
INTRAMUSCULAR | Status: AC
  Filled 2016-09-01: qty 2

## 2016-09-01 MED ORDER — DIPHENHYDRAMINE HCL 25 MG PO CAPS
ORAL_CAPSULE | ORAL | Status: AC
  Filled 2016-09-01: qty 1

## 2016-09-01 MED ORDER — METFORMIN HCL 500 MG PO TABS: 500.0000 mg | ORAL_TABLET | Freq: Two times a day (BID) | ORAL | 2 refills | Status: DC

## 2016-09-01 MED ORDER — SODIUM CHLORIDE 0.9% FLUSH
10.0000 mL | INTRAVENOUS | Status: DC | PRN
  Administered 2016-09-01: 10 mL
  Filled 2016-09-01: qty 10

## 2016-09-01 MED ORDER — ACETAMINOPHEN 325 MG PO TABS
650.0000 mg | ORAL_TABLET | Freq: Once | ORAL | Status: AC
  Administered 2016-09-01: 650 mg via ORAL

## 2016-09-01 MED ORDER — DEXAMETHASONE 4 MG PO TABS
40.0000 mg | ORAL_TABLET | Freq: Once | ORAL | Status: AC
  Administered 2016-09-01: 40 mg via ORAL

## 2016-09-01 MED ORDER — OMEPRAZOLE 20 MG PO CPDR: 20.0000 mg | DELAYED_RELEASE_CAPSULE | Freq: Every day | ORAL | 3 refills | Status: DC

## 2016-09-01 MED ORDER — PROCHLORPERAZINE MALEATE 10 MG PO TABS
10.0000 mg | ORAL_TABLET | Freq: Once | ORAL | Status: AC
  Administered 2016-09-01: 10 mg via ORAL

## 2016-09-01 MED ORDER — ACYCLOVIR 800 MG PO TABS: 800.0000 mg | ORAL_TABLET | Freq: Two times a day (BID) | ORAL | 2 refills | Status: DC

## 2016-09-01 MED ORDER — HEPARIN SOD (PORK) LOCK FLUSH 100 UNIT/ML IV SOLN
500.0000 [IU] | Freq: Once | INTRAVENOUS | Status: AC | PRN
  Administered 2016-09-01: 500 [IU]
  Filled 2016-09-01: qty 5

## 2016-09-01 MED ORDER — SODIUM CHLORIDE 0.9 % IV SOLN
Freq: Once | INTRAVENOUS | Status: AC
  Administered 2016-09-01: 11:00:00 via INTRAVENOUS

## 2016-09-01 MED ORDER — DIPHENHYDRAMINE HCL 25 MG PO CAPS
25.0000 mg | ORAL_CAPSULE | Freq: Once | ORAL | Status: AC
  Administered 2016-09-01: 25 mg via ORAL

## 2016-09-01 MED ORDER — SODIUM CHLORIDE 0.9 % IJ SOLN
10.0000 mL | INTRAMUSCULAR | Status: DC | PRN
  Administered 2016-09-01: 10 mL via INTRAVENOUS
  Filled 2016-09-01: qty 10

## 2016-09-01 MED ORDER — DARATUMUMAB CHEMO INJECTION 400 MG/20ML
16.0000 mg/kg | Freq: Once | INTRAVENOUS | Status: AC
  Administered 2016-09-01: 900 mg via INTRAVENOUS
  Filled 2016-09-01: qty 40

## 2016-09-01 MED ORDER — PROCHLORPERAZINE MALEATE 10 MG PO TABS
ORAL_TABLET | ORAL | Status: AC
  Filled 2016-09-01: qty 1

## 2016-09-01 MED ORDER — METHYLPREDNISOLONE SODIUM SUCC 125 MG IJ SOLR
125.0000 mg | Freq: Once | INTRAMUSCULAR | Status: AC
  Administered 2016-09-01: 125 mg via INTRAVENOUS

## 2016-09-01 MED ORDER — DEXAMETHASONE 4 MG PO TABS
ORAL_TABLET | ORAL | Status: AC
  Filled 2016-09-01: qty 10

## 2016-09-01 MED ORDER — ACETAMINOPHEN 325 MG PO TABS
ORAL_TABLET | ORAL | Status: AC
  Filled 2016-09-01: qty 2

## 2016-09-01 MED ORDER — BORTEZOMIB CHEMO SQ INJECTION 3.5 MG (2.5MG/ML)
1.5000 mg/m2 | Freq: Once | INTRAMUSCULAR | Status: AC
  Administered 2016-09-01: 2.5 mg via SUBCUTANEOUS
  Filled 2016-09-01: qty 2.5

## 2016-09-01 NOTE — Telephone Encounter (Signed)
Gave patient avs report and appointments for August and September  °

## 2016-09-01 NOTE — Patient Instructions (Signed)

## 2016-09-01 NOTE — Progress Notes (Signed)
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      10/10/2014 Miscellaneous    Peripheral blood chemistry and leukocytosis with total white count 78K, comprised of large plasma cells and his normocytic anemia. There is a myeloid left shift with previous surgical radium blasts. Flow cytometry showed 64% plasma cells      10/10/2014 Bone Marrow Biopsy    Markedly hypercellular marrow (95%), Atypical plasma cells comprise 57% of the cellularity. There was diminished multilineage in hematopoiesis with adequate maturation. Breasts less than 1%), no overt dysplasia of the myeloid or erythroid lineages.       10/10/2014 Initial Diagnosis    Plasma cell leukemia      10/13/2014 - 02/22/2015 Chemotherapy    CyborD (cytoxan 370m/m2 iv, bortezomib 1.5 mg/m, dexamethasone 40 mg, weekly every 28 days, bortezomib and dexamethasone was given twice weekly for 2 weeks during the first cycle)      04/04/2015 Bone Marrow Transplant    [facility] Her transplant course was complicated by sepsis from Escherichia coli bacteremia and associated colitis, she was discharged home on 04/27/2015.      05/07/2015 - 05/12/2015 Hospital Admission    [facility], [facility]. ID workup was negative, EGD showed evidence of gastritis and duodenitis, no H. pylori or  CMV.      05/20/2015 - 05/24/2015 Hospital Admission    [facility]      07/11/2015 Bone Marrow Biopsy    Post transplant 100 a bone marrow biopsy showed hypocellular marrow, 20%, no increase in plasma cells (2%) or other abnormalities.      08/22/2015 - 01/02/2016 Chemotherapy    MaintenanceCyborD (cytoxan 3066mm2 iv, bortezomib 1.5 mg/m, dexamethasone 40 mg, every 2 weeks, changed to Velcade maintenance after 4 months treatment      01/16/2016 - 04/19/2016 Chemotherapy    Maintenance Velcade 1.3 mg/m every 2 weeks      02/22/2016 Pathology Results    BONE MARROW: Diagnosis Bone Marrow, Aspirate,Biopsy, and Clot, right iliac - NORMOCELLULAR BONE MARROW FOR AGE WITH TRILINEAGE HEMATOPOIESIS. - PLASMACYTOSIS (PLASMA CELLS 12%). - SEE COMMENT. PERIPHERAL BLOOD: - OCCASIONAL CIRCULATING PLASMA CELLS.      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 female is here because of recently diagnosed plasma cell leukemia. [facility] and is here to establish her local oncological care [place] is accompanied  to the clinic by her daughter and interpreter.  She presented to our hospital lth with worsening dyspnea, fatigue, cough, subjective fevers and chills, and was admitted on 09/14/2014. She was found to have allergy WBC 54.1K, hemoglobin 8.1, plt 310, she was treated with symptom management, and was discharged home on 09/17/2014, with a plan to follow-up with hematology. She represents to  emergency room on 10/07/2014, was found to have white count of 78K, and worsening anemia with hemoglobin 6. She was seen by my partner Dr. Jonette Eva and plasma cell leukemia was suspected. She was transferred to [facility] for further leukemia work-up and treatment. Bone marrow biopsy was done, which confirmed plasma cell leukemia, and she started chemotherapy with Velcade, Cytoxan and dexamethasone. She received weekly dose twice, last dose on 10/20/2014. She was discharged to home afterwards. She had a mediport placed during her hospitalization.  She has moderate fatigue, low appetie, she lost about 13 lbs in the past few months. She has moderate abdominal pain, 5-6/10, persistent, she does not take any pain meds.   I reviewed her medical records extensively, and discussed her case with Dr. Jorje Guild and [place] program coordinator.  CURRENT THERAPY:   1. Velcade 1.31m/m2 and dexa 415mevery weeks 2. Daratumumab weekly started on 04/18/2016, changed to every 2 weeks on 06/25/2016 at 40 mg 3. zometa every 4 weeks started on 11/27/2015   INTERIM HISTORY:   [name] is here for a follow up with her interpreter. She presents to the clinic today with her friend. She reports she has a slight sore throat with no fever or cough. She uses salt water. She denies any pain. Her apatite is good and she has gained some weight. She takes steroids the steroids but no the complete 10 pills a week. She forget to take them yesterday or this morning, before her injection. She does not check her sugar, she has no problem taking metformin. She does not have medication for her hep B. They called [facility] but did not speak with anyone. She has not taken the medication in 2 months now.  She will see Dr. RoNorma Fredricksonvery 3 months    MEDICAL HISTORY:  Past Medical History:  Diagnosis Date  . Chills with fever    intermittently since d/c from hospital  . Dysuria-frequency syndrome    w/ pink urine  . GERD  (gastroesophageal reflux disease)   . Hepatitis   . History of positive PPD    DX 2011--  CXR DONE NO EVIDENCE  . History of ureter stent   . Hydronephrosis, right   . Neuromuscular disorder (HCC)    legs numb intermittently  . 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  . Pneumonia   . Right ureteral stone   . Urosepsis 8/14   admitted to [facility]    SURGICAL HISTORY: Past Surgical History:  Procedure Laterality Date  . CYSTOSCOPY W/ URETERAL STENT PLACEMENT Right 09/25/2012   Procedure: CYSTOSCOPY WITH RETROGRADE PYELOGRAM/URETERAL STENT PLACEMENT;  Surgeon: ThAlexis FrockMD;  Location: [facility];  Service: Urology;  Laterality: Right;  . CYSTOSCOPY WITH RETROGRADE PYELOGRAM, URETEROSCOPY AND STENT PLACEMENT Right 10/15/2012   Procedure: CYSTOSCOPY WITH RETROGRADE PYELOGRAM, URETEROSCOPY AND REMOVAL STENT WITH  STENT PLACEMENT;  Surgeon: ThAlexis FrockMD;  [facility];  Laterality: Right;  . ESOPHAGOGASTRODUODENOSCOPY (EGD) WITH PROPOFOL N/A 11/16/2014   Procedure: ESOPHAGOGASTRODUODENOSCOPY (EGD) WITH PROPOFOL;  Surgeon: DaMilus BanisterMD;  Location: [facility];  Service: Endoscopy;  Laterality:  N/A;  . HOLMIUM LASER APPLICATION Right 7/61/9509   Procedure: HOLMIUM LASER APPLICATION;  Surgeon: Alexis Frock, MD;  Location: [facility];  Service: Urology;  Laterality: Right;  . LIVER BIOPSY    . OTHER SURGICAL HISTORY Right    removal of ovarian cyst  . removal of uterine cyst     years ago  . RIGHT VATS W/ DRAINAGE PEURAL EFFUSION AND BX'S  10-30-2008    SOCIAL HISTORY: Social History   Social History  . Marital status: Single    Spouse name: N/A  . Number of children: 3  . Years of education: N/A   Social History Main Topics  . Smoking status: Never Smoker  . Smokeless tobacco: Never Used  . Alcohol use No  . Drug use: No  . Sexual activity: Not Currently    Birth control/ protection: Abstinence   Other Topics Concern   . None   Social History Narrative  . None    FAMILY HISTORY: Family History  Problem Relation Age of Onset  . Stomach cancer Mother   . Lung disease Father   . Asthma Father     ALLERGIES:  has No Known Allergies.  MEDICATIONS:  Current Outpatient Prescriptions  Medication Sig Dispense Refill  . acetaminophen (TYLENOL) 325 MG tablet Take 2 tablets (650 mg total) by mouth every 6 (six) hours as needed for mild pain (or Fever >/= 101). 30 tablet 0  . acyclovir (ZOVIRAX) 800 MG tablet Take 1 tablet (800 mg total) by mouth 2 (two) times daily. 60 tablet 2  . dexamethasone (DECADRON) 4 MG tablet Take 5 tablets (20 mg total) by mouth once a week. 40 tablet 1  . metFORMIN (GLUCOPHAGE) 500 MG tablet Take 1 tablet (500 mg total) by mouth 2 (two) times daily with a meal. 60 tablet 2  . omeprazole (PRILOSEC) 20 MG capsule Take 1 capsule (20 mg total) by mouth daily. 30 capsule 3  . ondansetron (ZOFRAN) 8 MG tablet Take 1 tablet (8 mg total) by mouth every 8 (eight) hours as needed for nausea or vomiting. 30 tablet 1  . polyethylene glycol (MIRALAX / GLYCOLAX) packet Take 17 g by mouth daily as needed.    . promethazine (PHENERGAN) 25 MG tablet Take 1 tablet (25 mg total) by mouth every 6 (six) hours as needed for nausea or vomiting. 30 tablet 0  . entecavir (BARACLUDE) 0.5 MG tablet Take 0.5 mg by mouth daily.    . feeding supplement (BOOST / RESOURCE BREEZE) LIQD Take 1 Container by mouth 3 (three) times daily between meals. (Patient not taking: Reported on 09/01/2016) 237 mL 0  . lidocaine-prilocaine (EMLA) cream Apply 1 application topically as directed.  0  . magic mouthwash w/lidocaine SOLN Take 15 mLs by mouth 4 (four) times daily as needed for mouth pain. (Patient not taking: Reported on 09/01/2016) 150 mL 0   No current facility-administered medications for this visit.    Facility-Administered Medications Ordered in Other Visits  Medication Dose Route Frequency Provider Last Rate Last  Dose  . bortezomib SQ (VELCADE) chemo injection 2.5 mg  1.5 mg/m2 (Treatment Plan Recorded) Subcutaneous Once Truitt Merle, MD      . heparin lock flush 100 unit/mL  500 Units Intracatheter Once PRN Truitt Merle, MD      . sodium chloride 0.9 % injection 10 mL  10 mL Intravenous PRN Truitt Merle, MD   10 mL at 12/11/15 1532  . sodium chloride 0.9 % injection 10 mL  10 mL Intravenous PRN Truitt Merle, MD   10 mL at 06/11/16 1505  . sodium chloride flush (NS) 0.9 % injection 10 mL  10 mL Intravenous PRN Truitt Merle, MD   10 mL at 10/09/15 1717  . sodium chloride flush (NS) 0.9 % injection 10 mL  10 mL Intracatheter PRN Truitt Merle, MD        REVIEW OF SYSTEMS:  Constitutional: no abnormal night sweats (+)improved fatigue Eyes: Denies blurriness of vision, double vision or watery eyes  Ears, nose, mouth, throat, and face: Denies mucositis (+) Sore throat  Respiratory: Denies, dyspnea or wheezes  Cardiovascular: Denies palpitation, chest discomfort or lower extremity swelling Gastrointestinal:  Denies nausea, heartburn, reports regular bowel/bladder Skin: Denies abnormal skin rashes Lymphatics: Denies new lymphadenopathy or easy bruising Neurological:Denies numbness, tingling or new weaknesses Behavioral/Psych: Mood is stable, no new changes  All other systems were reviewed with the patient and are negative.  PHYSICAL EXAMINATION:   ECOG PERFORMANCE STATUS: 1 Vitals:   09/01/16 0906  BP: 107/83  Pulse: 73  Resp: 18  Temp: 98 F (36.7 C)  TempSrc: Oral  SpO2: 100%  Weight: 128 lb 1.6 oz (58.1 kg)  Height: '5\' 2"'  (1.575 m)    GENERAL:alert, no distress and comfortable SKIN: skin color, texture, turgor are normal, no rashes or significant lesions EYES: normal, conjunctiva are pink and non-injected, sclera clear OROPHARYNX:no exudate, no erythema and lips, buccal mucosa (+) mouth is dry  NECK: supple, thyroid normal size, non-tender, without nodularity LYMPH:  no palpable lymphadenopathy in the  cervical, axillary or inguinal LUNGS: clear to auscultation and percussion with normal breathing effort HEART: regular rate & rhythm and no murmurs and no lower extremity edema ABDOMEN:abdomen soft, mildly tender, no hepatomegaly  Musculoskeletal:no cyanosis of digits and no clubbing  PSYCH: alert & oriented x 3 with fluent speech NEURO: no focal motor/sensory deficits  LABORATORY DATA:  I have reviewed the data as listed CBC Latest Ref Rng & Units 09/01/2016 08/26/2016 08/19/2016  WBC 3.9 - 10.3 10e3/uL 4.4 5.1 4.1  Hemoglobin 11.6 - 15.9 g/dL 9.8(L) 10.3(L) 10.7(L)  Hematocrit 34.8 - 46.6 % 32.6(L) 32.9(L) 34.7(L)  Platelets 145 - 400 10e3/uL 177 187 179    CMP Latest Ref Rng & Units 09/01/2016 08/26/2016 08/19/2016  Glucose 70 - 140 mg/dl 91 119 90  BUN 7.0 - 26.0 mg/dL 12.1 14.6 12.5  Creatinine 0.6 - 1.1 mg/dL 0.6 0.7 0.7  Sodium 136 - 145 mEq/L 141 140 143  Potassium 3.5 - 5.1 mEq/L 4.0 4.0 4.0  Chloride 101 - 111 mmol/L - - -  CO2 22 - 29 mEq/L '24 24 22  ' Calcium 8.4 - 10.4 mg/dL 8.8 9.0 8.9  Total Protein 6.4 - 8.3 g/dL 6.3(L) 6.5 6.4  Total Bilirubin 0.20 - 1.20 mg/dL 0.22 <0.22 0.23  Alkaline Phos 40 - 150 U/L 111 129 131  AST 5 - 34 U/L '25 23 23  ' ALT 0 - 55 U/L '9 12 11    ' SPEP M-protein  09/15/2014: 4.2 10/08/14: 4.6 12/08/2014: 0.2 02/15/2015: not sufficient sample for test   09/11/2015: not observed 10/09/2015: not det   11/06/2015: not det  12/25/2015: not det  02/13/2016: 0.3 03/12/2016: 0.8 04/09/2016: 0.6 05/08/16: 0.1 06/04/16: 0.1 07/02/2016: 0.2 07/09/2016: 0.2 (dara specific IFE negative) 08/05/16: Not observed  09/01/16: PENDING   IgG mg/dl 11/03/2014: 3150  12/08/2014:  759 02/14/2014: 860 09/11/2015: 1347 10/09/2015: 1457 11/06/2015: 1504 12/25/2015: 1400 02/13/2016: 1543 03/12/2016: 1860 04/09/2016: 1620 05/08/16: 749 06/04/16:  751 5/30/20187: 796 07/09/2016: 701 08/05/16: 678 08/05/16: PENDING    Kappa/lambda light chains levels and ration  11/03/14: 0.75, 152,  0.00 12/22/2014: 1.10, 2.60, 0.42 02/15/2015: 9.59,14.35, 0.67 09/11/2015: 2.44, 2.72, 0.90 10/09/2015: 3.09, 3.49, 0.89 11/06/2015: 2.30, 2.62, 0.88 12/25/2015: 2.51, 3.0, 0.84 02/13/2016: 2.64, 15.3, 0.17 03/12/2016: 2.27, 45.5, 0.05 04/09/2016: 3.33, 45.5, 0.07 05/08/2016: 0.49, 1.21, 0.40 06/04/2016: 0.83, 1.11, 0.75 07/02/16: 0.51, 1.43, 0.36 07/23/16: 0.68, 1.02, 0.67 08/05/16: 0.62, 1.09, 0.57 09/01/16: PENDING    24 h urine UPEP/IFE and light chain: 11/03/2014: IFE showed a monoclonal IgG heavy chain with associated lambda light chain. M protein was Undetectable   PATHOLOGY REPORT  Bone Marrow (BM) and Peripheral Blood (PB) FINAL PATHOLOGIC DIAGNOSIS  BONE MARROW: 07/11/2015 Hypocellular marrow (20%) with no increase in plasma cells (2%). See comment.  PERIPHERAL BLOOD: Mild anemia. No circulating plasma cells identified. See comment and CBC data.  BONE MARROW: 02/22/2016 Diagnosis Bone Marrow, Aspirate,Biopsy, and Clot, right iliac - NORMOCELLULAR BONE MARROW FOR AGE WITH TRILINEAGE HEMATOPOIESIS. - PLASMACYTOSIS (PLASMA CELLS 12%). - SEE COMMENT. PERIPHERAL BLOOD: - OCCASIONAL CIRCULATING PLASMA CELLS.  RADIOGRAPHIC STUDIES: I have personally reviewed the radiological images as listed and agreed with the findings in the report. No results found.    CXR 05/08/2016 IMPRESSION:  No acute disease.   CT Biopsy 02/22/16 IMPRESSION: 1. Technically successful CT guided right iliac bone core and aspiration biopsy.   ASSESSMENT & PLAN: 47 y.o. [place] woman, presented with anemia and leokocytosis   1. 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 Unfortunately this showed increased  plasma cells 12% with additional lambda light chain staining pending. This is relapse of disease. Cytogenetics was normal  -Her M protein and lambda free light chain level has significantly increased lately, consistent with disease relapse -Her recent LP showed negative CSF, no evidence of CNS involvement. -I have previously spoken with Dr. Norma Fredrickson, we agreed to change her CyBorD to weekly, and add daratumumab weekly, starting today. Potential side effects would discussed with patient, especially cytopenia and infusion reaction, she will take dexamethasone the day before Dara infusion.  -If her disease does not respond to treatment quickly, or if she has intolerance issue to cyBorD, I will change to pomalidomide,  dexamethasone and Dara  -continue acyclovir  -Continue Zometa every 4 weeks for a total of 2 years (until 11/2017) - Due to her hospitalization for severe cytopenia and infection, Cytoxan has been held. -she has restarted weekly Velcade and dexa, and now on Dara every 2 weeks, tolerating well, will continue. Her recent dara-specific IFE was negative for M-protein on 07/07/16 at [facility], she has achieved second CR - continue Velcade injection and dexa 48m weekly, and Dara infusion every 2 weeks. In September we will change Dara infusion to once a month as maintenance therapy - she is responding to treatment well, SPEP M protein has been undetectable since early July 2018 -Today She is clinically doing well, exam was unremarkable, but has dry mouth, lab results reviewed with her. Her blood counts are low but adequate to continue treatment.  -Proceed with Dara injection today, will give steroids before injection (she forgot to take at home) -I discussed to take dexamethasone 416mweekly on the day before getting injection -  I encourage her to contact us is she develops a fever, and for her to drink more water.    2. Type 2 diabetes, steroids induced  -She was noticed to have increased blood  glucose level lately, with random blood glucose above 200. She also developed diabetic symptoms. No history of diabetes in the past. -This is likely steroids induced hyperglycemia -I strongly recommend her to avoid any soft drink and sweats, and watch her carbohydrate intake, and exercise more  -She has been seen by her primary care physician. She was not given any medication to manage her glucose. I will call her primary care physician to discuss adding metformin. -I previously instructed her to take metformin every day since she is now taking dexamethasone more frequently.  -She restarted her dexamethasone weekly as part of his leukemia treatment, and she will also receive steroids as premedication, I recommend her to take metformin 500 mg twice daily continuously, to control her hyperglycemia.Potential side effects discussed, she agreed to proceed. -Continue close monitoring. -We previously discussed restarting to metformin one pill a day while on steroids to see if her levels will stabilize. I previously encouraged her to take it in the morning with breakfast.  -Due to increase in steroids, Pt will increase metformin to twice daily.    3. Chronic Hepatitis B  - She will follow-up [facility] clinic [facility] -continue entecavir, but she run out 2 weeks ago and has not refilled due to high copay.  Our nursing staff will call her GI physician's office to request this for her. - I encouraged her to call Dr at [facility] to get help with medication prices -She has not taken medication in 2 months  -She is able to afford her $250 copay, but still not able to get in touch with GI clinic. My nurse will contact GI clinic to help   4. GERD, recent GI bleeding and nausea  -Continue omeprazole daily. We previously discussed steroids can worsen her acid reflux. -She previously had an EGD at [facility] in 2017.  -- she did not do her colonoscopy because she did not have anyone to accompany her during visit. They  will call to reschedule.   5. Constipation -I previously encouraged the patient to try senca-s, ducalax 1-3 times a day or milk of magnesium if miralax doresn't help with constipation  - I previously advised the patient to take something everyday to help with constipation.   Plan  -Dara injection today  -Refill metformin today, increase to twice daily   - I will schedule treatments -Continue Velcade injection weekly and continue dara infusion every 2 weeks -f/u in 4 weeks    All questions were answered. The patient knows to call the clinic with any problems, questions or concerns.  I spent 20 minutes counseling the patient face to face. We used the vedio interpreter service. The total time spent in the appointment was 25 minutes and more than 50% was on counseling.   This document serves as a record of services personally performed by Truitt Merle, MD. It was created on her behalf by Joslyn Devon, a trained medical scribe. The creation of this record is based on the scribe's personal observations and the provider's statements to them. This document has been checked and approved by the attending provider.     Truitt Merle, MD  09/01/2016

## 2016-09-01 NOTE — Patient Instructions (Signed)
[facility] Discharge Instructions for Patients Receiving Chemotherapy  Today you received the following chemotherapy agents: Velcade and Darzalex.   To help prevent nausea and vomiting after your treatment, we encourage you to take your nausea medication as directed.   If you develop nausea and vomiting that is not controlled by your nausea medication, call the clinic.   BELOW ARE SYMPTOMS THAT SHOULD BE REPORTED IMMEDIATELY:  *FEVER GREATER THAN 100.5 F  *CHILLS WITH OR WITHOUT FEVER  NAUSEA AND VOMITING THAT IS NOT CONTROLLED WITH YOUR NAUSEA MEDICATION  *UNUSUAL SHORTNESS OF BREATH  *UNUSUAL BRUISING OR BLEEDING  TENDERNESS IN MOUTH AND THROAT WITH OR WITHOUT PRESENCE OF ULCERS  *URINARY PROBLEMS  *BOWEL PROBLEMS  UNUSUAL RASH Items with * indicate a potential emergency and should be followed up as soon as possible.  Feel free to call the clinic you have any questions or concerns. The clinic phone number is [phone].  Please show the CHEMO ALERT CARD at check-in to the Emergency Department and triage nurse.   

## 2016-09-02 ENCOUNTER — Telehealth: Payer: Self-pay | Admitting: Medical Oncology

## 2016-09-02 LAB — PROTEIN ELECTROPHORESIS, SERUM
A/G Ratio: 1.3 (ref 0.7–1.7)
Albumin: 3.3 g/dL (ref 2.9–4.4)
Alpha 1: 0.2 g/dL (ref 0.0–0.4)
Alpha 2: 0.9 g/dL (ref 0.4–1.0)
Beta: 0.9 g/dL (ref 0.7–1.3)
Gamma Globulin: 0.6 g/dL (ref 0.4–1.8)
Globulin, Total: 2.6 g/dL (ref 2.2–3.9)
Total Protein: 5.9 g/dL — ABNORMAL LOW (ref 6.0–8.5)

## 2016-09-02 LAB — KAPPA/LAMBDA LIGHT CHAINS
Ig Kappa Free Light Chain: 8 mg/L (ref 3.3–19.4)
Ig Lambda Free Light Chain: 12.1 mg/L (ref 5.7–26.3)
KAPPA/LAMBDA FLC RATIO: 0.66 (ref 0.26–1.65)

## 2016-09-02 NOTE — Telephone Encounter (Signed)
I spoke to Amy at [facility] and requested refill for Entecavir. She will contact Dr Lolita Lenz ( gastro) to refill. Amy  will send it to [facility].

## 2016-09-05 LAB — IFE, DARA-SPECIFIC, SERUM
IGA/IMMUNOGLOBULIN A, SERUM: 27 mg/dL — AB (ref 87–352)
IGG (IMMUNOGLOBIN G), SERUM: 650 mg/dL — AB (ref 700–1600)
IGM (IMMUNOGLOBIN M), SRM: 29 mg/dL (ref 26–217)

## 2016-09-07 ENCOUNTER — Other Ambulatory Visit: Payer: Self-pay | Admitting: *Deleted

## 2016-09-08 ENCOUNTER — Ambulatory Visit (HOSPITAL_BASED_OUTPATIENT_CLINIC_OR_DEPARTMENT_OTHER): Payer: Medicaid Other

## 2016-09-08 ENCOUNTER — Other Ambulatory Visit: Payer: Self-pay | Admitting: Hematology

## 2016-09-08 VITALS — BP 103/66 | HR 81 | Temp 97.0°F | Resp 16

## 2016-09-08 DIAGNOSIS — C9012 Plasma cell leukemia in relapse: Secondary | ICD-10-CM

## 2016-09-08 DIAGNOSIS — Z95828 Presence of other vascular implants and grafts: Secondary | ICD-10-CM

## 2016-09-08 DIAGNOSIS — Z5112 Encounter for antineoplastic immunotherapy: Secondary | ICD-10-CM

## 2016-09-08 DIAGNOSIS — C9010 Plasma cell leukemia not having achieved remission: Secondary | ICD-10-CM

## 2016-09-08 LAB — CBC WITH DIFFERENTIAL/PLATELET
BASO%: 0.4 % (ref 0.0–2.0)
BASOS ABS: 0 10*3/uL (ref 0.0–0.1)
EOS%: 0.7 % (ref 0.0–7.0)
Eosinophils Absolute: 0 10*3/uL (ref 0.0–0.5)
HEMATOCRIT: 32.9 % — AB (ref 34.8–46.6)
HEMOGLOBIN: 10.5 g/dL — AB (ref 11.6–15.9)
LYMPH#: 0.7 10*3/uL — AB (ref 0.9–3.3)
LYMPH%: 15.4 % (ref 14.0–49.7)
MCH: 27 pg (ref 25.1–34.0)
MCHC: 32 g/dL (ref 31.5–36.0)
MCV: 84.3 fL (ref 79.5–101.0)
MONO#: 0.5 10*3/uL (ref 0.1–0.9)
MONO%: 11.7 % (ref 0.0–14.0)
NEUT#: 3.4 10*3/uL (ref 1.5–6.5)
NEUT%: 71.8 % (ref 38.4–76.8)
Platelets: 182 10*3/uL (ref 145–400)
RBC: 3.91 10*6/uL (ref 3.70–5.45)
RDW: 14.7 % — AB (ref 11.2–14.5)
WBC: 4.7 10*3/uL (ref 3.9–10.3)

## 2016-09-08 LAB — COMPREHENSIVE METABOLIC PANEL
ALBUMIN: 3.5 g/dL (ref 3.5–5.0)
ALT: 10 U/L (ref 0–55)
ANION GAP: 8 meq/L (ref 3–11)
AST: 23 U/L (ref 5–34)
Alkaline Phosphatase: 110 U/L (ref 40–150)
BILIRUBIN TOTAL: 0.22 mg/dL (ref 0.20–1.20)
BUN: 12 mg/dL (ref 7.0–26.0)
CALCIUM: 9.4 mg/dL (ref 8.4–10.4)
CO2: 27 meq/L (ref 22–29)
CREATININE: 0.7 mg/dL (ref 0.6–1.1)
Chloride: 107 mEq/L (ref 98–109)
EGFR: >90 mL/min/{1.73_m2} (ref >90)
Glucose: 77 mg/dl (ref 70–140)
Potassium: 3.5 mEq/L (ref 3.5–5.1)
Sodium: 142 mEq/L (ref 136–145)
Total Protein: 6.6 g/dL (ref 6.4–8.3)

## 2016-09-08 MED ORDER — ZOLEDRONIC ACID 4 MG/100ML IV SOLN
4.0000 mg | Freq: Once | INTRAVENOUS | Status: AC
  Administered 2016-09-08: 4 mg via INTRAVENOUS
  Filled 2016-09-08: qty 100

## 2016-09-08 MED ORDER — HEPARIN SOD (PORK) LOCK FLUSH 100 UNIT/ML IV SOLN
500.0000 [IU] | Freq: Once | INTRAVENOUS | Status: AC | PRN
  Administered 2016-09-08: 500 [IU]
  Filled 2016-09-08: qty 5

## 2016-09-08 MED ORDER — SODIUM CHLORIDE 0.9 % IV SOLN
Freq: Once | INTRAVENOUS | Status: AC
  Administered 2016-09-08: 16:00:00 via INTRAVENOUS

## 2016-09-08 MED ORDER — SODIUM CHLORIDE 0.9% FLUSH
10.0000 mL | INTRAVENOUS | Status: DC | PRN
  Administered 2016-09-08: 10 mL
  Filled 2016-09-08: qty 10

## 2016-09-08 MED ORDER — BORTEZOMIB CHEMO SQ INJECTION 3.5 MG (2.5MG/ML)
1.5000 mg/m2 | Freq: Once | INTRAMUSCULAR | Status: AC
  Administered 2016-09-08: 2.5 mg via SUBCUTANEOUS
  Filled 2016-09-08: qty 2.5

## 2016-09-15 ENCOUNTER — Ambulatory Visit (HOSPITAL_BASED_OUTPATIENT_CLINIC_OR_DEPARTMENT_OTHER): Payer: Medicaid Other

## 2016-09-15 ENCOUNTER — Other Ambulatory Visit (HOSPITAL_BASED_OUTPATIENT_CLINIC_OR_DEPARTMENT_OTHER): Payer: Medicaid Other

## 2016-09-15 ENCOUNTER — Other Ambulatory Visit: Payer: Self-pay | Admitting: Hematology

## 2016-09-15 VITALS — BP 97/62 | HR 96 | Temp 97.8°F | Resp 18

## 2016-09-15 DIAGNOSIS — C9012 Plasma cell leukemia in relapse: Secondary | ICD-10-CM

## 2016-09-15 DIAGNOSIS — Z5112 Encounter for antineoplastic immunotherapy: Secondary | ICD-10-CM

## 2016-09-15 DIAGNOSIS — C9010 Plasma cell leukemia not having achieved remission: Secondary | ICD-10-CM

## 2016-09-15 LAB — COMPREHENSIVE METABOLIC PANEL
ALBUMIN: 3.6 g/dL (ref 3.5–5.0)
ALK PHOS: 139 U/L (ref 40–150)
ALT: 60 U/L — AB (ref 0–55)
AST: 131 U/L — AB (ref 5–34)
Anion Gap: 12 mEq/L — ABNORMAL HIGH (ref 3–11)
BILIRUBIN TOTAL: 0.22 mg/dL (ref 0.20–1.20)
BUN: 13.2 mg/dL (ref 7.0–26.0)
CO2: 22 mEq/L (ref 22–29)
CREATININE: 0.7 mg/dL (ref 0.6–1.1)
Calcium: 9.4 mg/dL (ref 8.4–10.4)
Chloride: 107 mEq/L (ref 98–109)
GLUCOSE: 157 mg/dL — AB (ref 70–140)
Potassium: 4.2 mEq/L (ref 3.5–5.1)
SODIUM: 140 meq/L (ref 136–145)
TOTAL PROTEIN: 7.1 g/dL (ref 6.4–8.3)

## 2016-09-15 LAB — CBC WITH DIFFERENTIAL/PLATELET
BASO%: 0.2 % (ref 0.0–2.0)
Basophils Absolute: 0 10*3/uL (ref 0.0–0.1)
EOS ABS: 0 10*3/uL (ref 0.0–0.5)
EOS%: 0 % (ref 0.0–7.0)
HCT: 33.6 % — ABNORMAL LOW (ref 34.8–46.6)
HEMOGLOBIN: 10.8 g/dL — AB (ref 11.6–15.9)
LYMPH%: 15.4 % (ref 14.0–49.7)
MCH: 27 pg (ref 25.1–34.0)
MCHC: 32.2 g/dL (ref 31.5–36.0)
MCV: 83.9 fL (ref 79.5–101.0)
MONO#: 0 10*3/uL — ABNORMAL LOW (ref 0.1–0.9)
MONO%: 1 % (ref 0.0–14.0)
NEUT%: 83.4 % — ABNORMAL HIGH (ref 38.4–76.8)
NEUTROS ABS: 2.7 10*3/uL (ref 1.5–6.5)
Platelets: 191 10*3/uL (ref 145–400)
RBC: 4 10*6/uL (ref 3.70–5.45)
RDW: 14.2 % (ref 11.2–14.5)
WBC: 3.2 10*3/uL — AB (ref 3.9–10.3)
lymph#: 0.5 10*3/uL — ABNORMAL LOW (ref 0.9–3.3)

## 2016-09-15 MED ORDER — PROCHLORPERAZINE MALEATE 10 MG PO TABS
10.0000 mg | ORAL_TABLET | Freq: Once | ORAL | Status: AC
  Administered 2016-09-15: 10 mg via ORAL

## 2016-09-15 MED ORDER — ACETAMINOPHEN 325 MG PO TABS
ORAL_TABLET | ORAL | Status: AC
  Filled 2016-09-15: qty 2

## 2016-09-15 MED ORDER — BORTEZOMIB CHEMO SQ INJECTION 3.5 MG (2.5MG/ML)
1.5000 mg/m2 | Freq: Once | INTRAMUSCULAR | Status: AC
  Administered 2016-09-15: 2.5 mg via SUBCUTANEOUS
  Filled 2016-09-15: qty 2.5

## 2016-09-15 MED ORDER — SODIUM CHLORIDE 0.9% FLUSH
10.0000 mL | INTRAVENOUS | Status: DC | PRN
  Administered 2016-09-15: 10 mL
  Filled 2016-09-15: qty 10

## 2016-09-15 MED ORDER — SODIUM CHLORIDE 0.9 % IV SOLN
16.0000 mg/kg | Freq: Once | INTRAVENOUS | Status: AC
  Administered 2016-09-15: 900 mg via INTRAVENOUS
  Filled 2016-09-15: qty 40

## 2016-09-15 MED ORDER — DIPHENHYDRAMINE HCL 25 MG PO CAPS
ORAL_CAPSULE | ORAL | Status: AC
  Filled 2016-09-15: qty 1

## 2016-09-15 MED ORDER — METHYLPREDNISOLONE SODIUM SUCC 125 MG IJ SOLR
125.0000 mg | Freq: Once | INTRAMUSCULAR | Status: AC
  Administered 2016-09-15: 125 mg via INTRAVENOUS

## 2016-09-15 MED ORDER — METHYLPREDNISOLONE SODIUM SUCC 125 MG IJ SOLR
INTRAMUSCULAR | Status: AC
  Filled 2016-09-15: qty 2

## 2016-09-15 MED ORDER — HEPARIN SOD (PORK) LOCK FLUSH 100 UNIT/ML IV SOLN
500.0000 [IU] | Freq: Once | INTRAVENOUS | Status: AC | PRN
  Administered 2016-09-15: 500 [IU]
  Filled 2016-09-15: qty 5

## 2016-09-15 MED ORDER — DIPHENHYDRAMINE HCL 25 MG PO CAPS
25.0000 mg | ORAL_CAPSULE | Freq: Once | ORAL | Status: AC
  Administered 2016-09-15: 25 mg via ORAL

## 2016-09-15 MED ORDER — ACETAMINOPHEN 325 MG PO TABS
650.0000 mg | ORAL_TABLET | Freq: Once | ORAL | Status: AC
  Administered 2016-09-15: 650 mg via ORAL

## 2016-09-15 MED ORDER — PROCHLORPERAZINE MALEATE 10 MG PO TABS
ORAL_TABLET | ORAL | Status: AC
  Filled 2016-09-15: qty 1

## 2016-09-15 MED ORDER — SODIUM CHLORIDE 0.9 % IV SOLN
Freq: Once | INTRAVENOUS | Status: AC
  Administered 2016-09-15: 09:00:00 via INTRAVENOUS

## 2016-09-15 NOTE — Progress Notes (Signed)
Ok to treat with ALT 131 and AST 60

## 2016-09-22 ENCOUNTER — Ambulatory Visit (HOSPITAL_BASED_OUTPATIENT_CLINIC_OR_DEPARTMENT_OTHER): Payer: Medicaid Other

## 2016-09-22 ENCOUNTER — Ambulatory Visit: Payer: Medicaid Other

## 2016-09-22 ENCOUNTER — Other Ambulatory Visit (HOSPITAL_BASED_OUTPATIENT_CLINIC_OR_DEPARTMENT_OTHER): Payer: Medicaid Other

## 2016-09-22 VITALS — BP 112/67 | HR 89 | Temp 97.9°F | Resp 18

## 2016-09-22 DIAGNOSIS — C9010 Plasma cell leukemia not having achieved remission: Secondary | ICD-10-CM

## 2016-09-22 DIAGNOSIS — C9012 Plasma cell leukemia in relapse: Secondary | ICD-10-CM

## 2016-09-22 DIAGNOSIS — Z5112 Encounter for antineoplastic immunotherapy: Secondary | ICD-10-CM

## 2016-09-22 DIAGNOSIS — Z95828 Presence of other vascular implants and grafts: Secondary | ICD-10-CM

## 2016-09-22 LAB — COMPREHENSIVE METABOLIC PANEL
ALBUMIN: 3.6 g/dL (ref 3.5–5.0)
ALK PHOS: 133 U/L (ref 40–150)
ALT: 22 U/L (ref 0–55)
ANION GAP: 9 meq/L (ref 3–11)
AST: 33 U/L (ref 5–34)
BUN: 11.8 mg/dL (ref 7.0–26.0)
CALCIUM: 9.1 mg/dL (ref 8.4–10.4)
CO2: 24 mEq/L (ref 22–29)
Chloride: 107 mEq/L (ref 98–109)
Creatinine: 0.7 mg/dL (ref 0.6–1.1)
Glucose: 151 mg/dl — ABNORMAL HIGH (ref 70–140)
Potassium: 3.9 mEq/L (ref 3.5–5.1)
Sodium: 140 mEq/L (ref 136–145)
TOTAL PROTEIN: 6.9 g/dL (ref 6.4–8.3)

## 2016-09-22 LAB — CBC WITH DIFFERENTIAL/PLATELET
BASO%: 0 % (ref 0.0–2.0)
Basophils Absolute: 0 10*3/uL (ref 0.0–0.1)
EOS ABS: 0 10*3/uL (ref 0.0–0.5)
EOS%: 0 % (ref 0.0–7.0)
HCT: 33.3 % — ABNORMAL LOW (ref 34.8–46.6)
HEMOGLOBIN: 10.4 g/dL — AB (ref 11.6–15.9)
LYMPH%: 9.6 % — AB (ref 14.0–49.7)
MCH: 26.7 pg (ref 25.1–34.0)
MCHC: 31.2 g/dL — ABNORMAL LOW (ref 31.5–36.0)
MCV: 85.4 fL (ref 79.5–101.0)
MONO#: 0.1 10*3/uL (ref 0.1–0.9)
MONO%: 1.1 % (ref 0.0–14.0)
NEUT%: 89.3 % — ABNORMAL HIGH (ref 38.4–76.8)
NEUTROS ABS: 6.3 10*3/uL (ref 1.5–6.5)
PLATELETS: 208 10*3/uL (ref 145–400)
RBC: 3.9 10*6/uL (ref 3.70–5.45)
RDW: 14.1 % (ref 11.2–14.5)
WBC: 7.1 10*3/uL (ref 3.9–10.3)
lymph#: 0.7 10*3/uL — ABNORMAL LOW (ref 0.9–3.3)

## 2016-09-22 MED ORDER — HEPARIN SOD (PORK) LOCK FLUSH 100 UNIT/ML IV SOLN
500.0000 [IU] | Freq: Once | INTRAVENOUS | Status: AC | PRN
  Administered 2016-09-22: 500 [IU]
  Filled 2016-09-22: qty 5

## 2016-09-22 MED ORDER — BORTEZOMIB CHEMO SQ INJECTION 3.5 MG (2.5MG/ML)
1.5000 mg/m2 | Freq: Once | INTRAMUSCULAR | Status: AC
  Administered 2016-09-22: 2.5 mg via SUBCUTANEOUS
  Filled 2016-09-22: qty 2.5

## 2016-09-22 MED ORDER — SODIUM CHLORIDE 0.9 % IJ SOLN
10.0000 mL | INTRAMUSCULAR | Status: DC | PRN
  Administered 2016-09-22: 10 mL via INTRAVENOUS
  Filled 2016-09-22: qty 10

## 2016-09-22 MED ORDER — SODIUM CHLORIDE 0.9% FLUSH
10.0000 mL | INTRAVENOUS | Status: DC | PRN
  Administered 2016-09-22: 10 mL
  Filled 2016-09-22: qty 10

## 2016-09-22 NOTE — Patient Instructions (Signed)
[facility] Discharge Instructions for Patients Receiving Chemotherapy  Today you received the following chemotherapy agents Velcade. To help prevent nausea and vomiting after your treatment, we encourage you to take your nausea medication as directed.  If you develop nausea and vomiting that is not controlled by your nausea medication, call the clinic.   BELOW ARE SYMPTOMS THAT SHOULD BE REPORTED IMMEDIATELY:  *FEVER GREATER THAN 100.5 F  *CHILLS WITH OR WITHOUT FEVER  NAUSEA AND VOMITING THAT IS NOT CONTROLLED WITH YOUR NAUSEA MEDICATION  *UNUSUAL SHORTNESS OF BREATH  *UNUSUAL BRUISING OR BLEEDING  TENDERNESS IN MOUTH AND THROAT WITH OR WITHOUT PRESENCE OF ULCERS  *URINARY PROBLEMS  *BOWEL PROBLEMS  UNUSUAL RASH Items with * indicate a potential emergency and should be followed up as soon as possible.  Feel free to call the clinic you have any questions or concerns. The clinic phone number is [phone].  Please show the CHEMO ALERT CARD at check-in to the Emergency Department and triage nurse.    

## 2016-09-29 ENCOUNTER — Ambulatory Visit (HOSPITAL_BASED_OUTPATIENT_CLINIC_OR_DEPARTMENT_OTHER): Payer: Medicaid Other

## 2016-09-29 ENCOUNTER — Ambulatory Visit (HOSPITAL_BASED_OUTPATIENT_CLINIC_OR_DEPARTMENT_OTHER): Payer: Medicaid Other | Admitting: Hematology

## 2016-09-29 ENCOUNTER — Other Ambulatory Visit (HOSPITAL_BASED_OUTPATIENT_CLINIC_OR_DEPARTMENT_OTHER): Payer: Medicaid Other

## 2016-09-29 ENCOUNTER — Ambulatory Visit: Payer: Medicaid Other

## 2016-09-29 ENCOUNTER — Encounter: Payer: Self-pay | Admitting: Hematology

## 2016-09-29 ENCOUNTER — Telehealth: Payer: Self-pay | Admitting: Hematology

## 2016-09-29 VITALS — BP 119/78 | HR 76 | Temp 97.8°F | Resp 18

## 2016-09-29 VITALS — BP 110/68 | HR 88 | Temp 98.7°F | Resp 18 | Ht 62.0 in | Wt 127.4 lb

## 2016-09-29 DIAGNOSIS — E099 Drug or chemical induced diabetes mellitus without complications: Secondary | ICD-10-CM

## 2016-09-29 DIAGNOSIS — D649 Anemia, unspecified: Secondary | ICD-10-CM

## 2016-09-29 DIAGNOSIS — T380X5A Adverse effect of glucocorticoids and synthetic analogues, initial encounter: Secondary | ICD-10-CM

## 2016-09-29 DIAGNOSIS — C9010 Plasma cell leukemia not having achieved remission: Secondary | ICD-10-CM

## 2016-09-29 DIAGNOSIS — K219 Gastro-esophageal reflux disease without esophagitis: Secondary | ICD-10-CM

## 2016-09-29 DIAGNOSIS — Z5112 Encounter for antineoplastic immunotherapy: Secondary | ICD-10-CM

## 2016-09-29 DIAGNOSIS — B181 Chronic viral hepatitis B without delta-agent: Secondary | ICD-10-CM

## 2016-09-29 DIAGNOSIS — C9012 Plasma cell leukemia in relapse: Secondary | ICD-10-CM

## 2016-09-29 DIAGNOSIS — C9011 Plasma cell leukemia in remission: Secondary | ICD-10-CM

## 2016-09-29 LAB — CBC WITH DIFFERENTIAL/PLATELET
BASO%: 0 % (ref 0.0–2.0)
Basophils Absolute: 0 10*3/uL (ref 0.0–0.1)
EOS%: 0 % (ref 0.0–7.0)
Eosinophils Absolute: 0 10*3/uL (ref 0.0–0.5)
HCT: 34.4 % — ABNORMAL LOW (ref 34.8–46.6)
HEMOGLOBIN: 10.5 g/dL — AB (ref 11.6–15.9)
LYMPH%: 12.2 % — AB (ref 14.0–49.7)
MCH: 26.1 pg (ref 25.1–34.0)
MCHC: 30.5 g/dL — ABNORMAL LOW (ref 31.5–36.0)
MCV: 85.6 fL (ref 79.5–101.0)
MONO#: 0 10*3/uL — ABNORMAL LOW (ref 0.1–0.9)
MONO%: 0.5 % (ref 0.0–14.0)
NEUT%: 87.3 % — ABNORMAL HIGH (ref 38.4–76.8)
NEUTROS ABS: 3.2 10*3/uL (ref 1.5–6.5)
Platelets: 193 10*3/uL (ref 145–400)
RBC: 4.02 10*6/uL (ref 3.70–5.45)
RDW: 14.1 % (ref 11.2–14.5)
WBC: 3.7 10*3/uL — AB (ref 3.9–10.3)
lymph#: 0.5 10*3/uL — ABNORMAL LOW (ref 0.9–3.3)

## 2016-09-29 LAB — COMPREHENSIVE METABOLIC PANEL
ALBUMIN: 3.5 g/dL (ref 3.5–5.0)
ALK PHOS: 112 U/L (ref 40–150)
ALT: 12 U/L (ref 0–55)
AST: 22 U/L (ref 5–34)
Anion Gap: 10 mEq/L (ref 3–11)
BUN: 13.3 mg/dL (ref 7.0–26.0)
CO2: 22 meq/L (ref 22–29)
Calcium: 9.6 mg/dL (ref 8.4–10.4)
Chloride: 108 mEq/L (ref 98–109)
Creatinine: 0.7 mg/dL (ref 0.6–1.1)
GLUCOSE: 185 mg/dL — AB (ref 70–140)
POTASSIUM: 4.1 meq/L (ref 3.5–5.1)
SODIUM: 141 meq/L (ref 136–145)
TOTAL PROTEIN: 7.1 g/dL (ref 6.4–8.3)
Total Bilirubin: 0.26 mg/dL (ref 0.20–1.20)

## 2016-09-29 MED ORDER — PROCHLORPERAZINE EDISYLATE 5 MG/ML IJ SOLN
INTRAMUSCULAR | Status: AC
  Filled 2016-09-29: qty 2

## 2016-09-29 MED ORDER — SODIUM CHLORIDE 0.9 % IV SOLN
Freq: Once | INTRAVENOUS | Status: AC
  Administered 2016-09-29: 11:00:00 via INTRAVENOUS

## 2016-09-29 MED ORDER — BORTEZOMIB CHEMO SQ INJECTION 3.5 MG (2.5MG/ML)
1.5000 mg/m2 | Freq: Once | INTRAMUSCULAR | Status: AC
  Administered 2016-09-29: 2.5 mg via SUBCUTANEOUS
  Filled 2016-09-29: qty 2.5

## 2016-09-29 MED ORDER — DIPHENHYDRAMINE HCL 25 MG PO CAPS
25.0000 mg | ORAL_CAPSULE | Freq: Once | ORAL | Status: AC
  Administered 2016-09-29: 25 mg via ORAL

## 2016-09-29 MED ORDER — DEXAMETHASONE 4 MG PO TABS: 40.0000 mg | ORAL_TABLET | ORAL | 2 refills | Status: DC

## 2016-09-29 MED ORDER — ACETAMINOPHEN 325 MG PO TABS
ORAL_TABLET | ORAL | Status: AC
  Filled 2016-09-29: qty 2

## 2016-09-29 MED ORDER — METHYLPREDNISOLONE SODIUM SUCC 125 MG IJ SOLR
INTRAMUSCULAR | Status: AC
  Filled 2016-09-29: qty 2

## 2016-09-29 MED ORDER — SODIUM CHLORIDE 0.9% FLUSH
10.0000 mL | INTRAVENOUS | Status: DC | PRN
  Administered 2016-09-29: 10 mL
  Filled 2016-09-29: qty 10

## 2016-09-29 MED ORDER — SODIUM CHLORIDE 0.9 % IV SOLN
16.0000 mg/kg | Freq: Once | INTRAVENOUS | Status: AC
  Administered 2016-09-29: 900 mg via INTRAVENOUS
  Filled 2016-09-29: qty 40

## 2016-09-29 MED ORDER — METHYLPREDNISOLONE SODIUM SUCC 125 MG IJ SOLR
125.0000 mg | Freq: Once | INTRAMUSCULAR | Status: AC
  Administered 2016-09-29: 125 mg via INTRAVENOUS

## 2016-09-29 MED ORDER — HEPARIN SOD (PORK) LOCK FLUSH 100 UNIT/ML IV SOLN
500.0000 [IU] | Freq: Once | INTRAVENOUS | Status: AC | PRN
  Administered 2016-09-29: 500 [IU]
  Filled 2016-09-29: qty 5

## 2016-09-29 MED ORDER — ACETAMINOPHEN 325 MG PO TABS
650.0000 mg | ORAL_TABLET | Freq: Once | ORAL | Status: AC
  Administered 2016-09-29: 650 mg via ORAL

## 2016-09-29 MED ORDER — DIPHENHYDRAMINE HCL 25 MG PO CAPS
ORAL_CAPSULE | ORAL | Status: AC
  Filled 2016-09-29: qty 1

## 2016-09-29 MED ORDER — PROCHLORPERAZINE MALEATE 10 MG PO TABS
ORAL_TABLET | ORAL | Status: AC
  Filled 2016-09-29: qty 1

## 2016-09-29 MED ORDER — PROCHLORPERAZINE MALEATE 10 MG PO TABS
10.0000 mg | ORAL_TABLET | Freq: Once | ORAL | Status: AC
  Administered 2016-09-29: 10 mg via ORAL

## 2016-09-29 NOTE — Progress Notes (Signed)
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      10/10/2014 Miscellaneous    Peripheral blood chemistry and leukocytosis with total white count 78K, comprised of large plasma cells and his normocytic anemia. There is a myeloid left shift with previous surgical radium blasts. Flow cytometry showed 64% plasma cells      10/10/2014 Bone Marrow Biopsy    Markedly hypercellular marrow (95%), Atypical plasma cells comprise 57% of the cellularity. There was diminished multilineage in hematopoiesis with adequate maturation. Breasts less than 1%), no overt dysplasia of the myeloid or erythroid lineages.       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      05/07/2015 - 05/12/2015 Hospital Admission    [facility], [facility]. ID workup was negative, EGD showed evidence of gastritis and duodenitis, no H. pylori or  CMV.      05/20/2015 - 05/24/2015 Hospital Admission    [facility]      07/11/2015 Bone Marrow Biopsy    Post transplant 100 a bone marrow biopsy showed hypocellular marrow, 20%, no increase in plasma cells (2%) or other abnormalities.      08/22/2015 - 01/02/2016 Chemotherapy    MaintenanceCyborD (cytoxan 3039mm2 iv, bortezomib 1.5 mg/m, dexamethasone 40 mg, every 2 weeks, changed to Velcade maintenance after 4 months treatment      01/16/2016 - 04/19/2016 Chemotherapy    Maintenance Velcade 1.3 mg/m every 2 weeks      02/22/2016 Pathology Results    BONE MARROW: Diagnosis Bone Marrow, Aspirate,Biopsy, and Clot, right iliac - NORMOCELLULAR BONE MARROW FOR AGE WITH TRILINEAGE HEMATOPOIESIS. - PLASMACYTOSIS (PLASMA CELLS 12%). - SEE COMMENT. PERIPHERAL BLOOD: - OCCASIONAL CIRCULATING PLASMA CELLS.      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 female is here because of recently diagnosed plasma cell leukemia. [facility] and is here to establish her local oncological care with [place] a [place] not speak [place] is accompanied  to the clinic by her daughter and interpreter.  She presented to our hospital lth with worsening dyspnea, fatigue, cough, subjective fevers and chills, and was admitted on 09/14/2014. She was found to have allergy WBC 54.1K, hemoglobin 8.1, plt 310, she was treated with symptom management, and was discharged home on 09/17/2014, with a plan to follow-up with hematology. She represents to  emergency room on 10/07/2014, was found to have white count of 78K, and worsening anemia with hemoglobin 6. She was seen by my partner Dr. Jonette Eva and plasma cell leukemia was suspected. She was transferred to [facility] for further leukemia work-up and treatment. Bone marrow biopsy was done, which confirmed plasma cell leukemia, and she started chemotherapy with Velcade, Cytoxan and dexamethasone. She received weekly dose twice, last dose on 10/20/2014. She was discharged to home afterwards. She had a mediport placed during her hospitalization.  She has moderate fatigue, low appetie, she lost about 13 lbs in the past few months. She has moderate abdominal pain, 5-6/10, persistent, she does not take any pain meds.   I reviewed her medical records extensively, and discussed her case with Dr. Jorje Guild and [place] program coordinator.  CURRENT THERAPY:   1. Velcade 1.35m/m2 and dexa 475mevery weeks 2. Daratumumab weekly started on 04/18/2016, changed to every 2 weeks on 06/25/2016 at 40 mg 3. zometa every 4 weeks started on 11/27/2015   INTERIM HISTORY:   Sheryl [name] is here for a follow up with her interpreter. She presents to the clinic today with her friend. Pt reports she had a fever yesterday, 09/28/16 that started ~ 2 pm and resolved ~10-11 pm. Pt took tylenol at 9 pm with relief. She endorses chills, and a HA. Today she feels better. 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 Pt also notes not wearing a mask while working, and says she doesn't think about herself while working. No one at home is sick. Pt is concerned about her right eye and has tried taking fish oil for the heaviness. Her eye doesn't cause her pain. Pt wanted to make sure it was okay for her to take the fish oil. She has not gotten her Hep B medication refilled and would like to be referred to a doctor here. She  is currently scheduled to see Dr. RoNorma Fredricksonn December. Pt is supposed to see them every three months and last saw Dr. RoNorma Fredricksonuly 4, 2018. Pt takes metformin once a day, at night. Her sugars are increased and reports she did eat breakfast this morning, and notes drinking coffee.   On review of systems, pt denies sore throat, cough, urinary sx, weight loss, decreased appetite. Denies pain. Pt denies abdominal pain, nausea, vomiting. Endorsees chills, HA, intermittent body aches, fatigue and right eye discomfort.    MEDICAL HISTORY:  Past Medical History:  Diagnosis Date  . Chills with fever    intermittently since d/c from hospital  . Dysuria-frequency syndrome    w/ pink urine  . GERD (gastroesophageal reflux disease)   . Hepatitis   . History of positive PPD    DX 2011--  CXR DONE NO EVIDENCE  . History of ureter stent   . Hydronephrosis, right   . Neuromuscular disorder (HCC)    legs numb intermittently  . 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  . Pneumonia   . Right ureteral stone   . Urosepsis  8/14   ad[facility]    SURGICAL HISTORY: Past Surgical History:  Procedure Laterality Date  . CYSTOSCOPY W/ URETERAL STENT PLACEMENT Right 09/25/2012   Procedure: CYSTOSCOPY WITH RETROGRADE PYELOGRAM/URETERAL STENT PLACEMENT;  Surgeon: Alexis Frock, MD;  Location: [facility];  Service: Urology;  Laterality: Right;  . CYSTOSCOPY WITH RETROGRADE PYELOGRAM, URETEROSCOPY AND STENT PLACEMENT Right 10/15/2012   Procedure: CYSTOSCOPY WITH RETROGRADE PYELOGRAM, URETEROSCOPY AND REMOVAL STENT WITH  STENT PLACEMENT;  Surgeon: Alexis Frock, MD;  Location: [facility];  Service: Urology;  Laterality: Right;  . ESOPHAGOGASTRODUODENOSCOPY (EGD) WITH PROPOFOL N/A 11/16/2014   Procedure: ESOPHAGOGASTRODUODENOSCOPY (EGD) WITH PROPOFOL;  Surgeon: Milus Banister, MD;  Location: [facility];  Service: Endoscopy;  Laterality: N/A;  . HOLMIUM LASER APPLICATION Right 8/33/8250   Procedure:  HOLMIUM LASER APPLICATION;  Surgeon: Alexis Frock, MD;  Location[facility];  Service: Urology;  Laterality: Right;  . LIVER BIOPSY    . OTHER SURGICAL HISTORY Right    removal of ovarian cyst  . removal of uterine cyst     years ago  . RIGHT VATS W/ DRAINAGE PEURAL EFFUSION AND BX'S  10-30-2008    SOCIAL HISTORY: Social History   Social History  . Marital status: Single    Spouse name: N/A  . Number of children: 3  . Years of education: N/A   Social History Main Topics  . Smoking status: Never Smoker  . Smokeless tobacco: Never Used  . Alcohol use No  . Drug use: No  . Sexual activity: Not Currently    Birth control/ protection: Abstinence   Other Topics Concern  . None   Social History Narrative  . None    FAMILY HISTORY: Family History  Problem Relation Age of Onset  . Stomach cancer Mother   . Lung disease Father   . Asthma Father     ALLERGIES:  has No Known Allergies.  MEDICATIONS:  Current Outpatient Prescriptions  Medication Sig Dispense Refill  . acetaminophen (TYLENOL) 325 MG tablet Take 2 tablets (650 mg total) by mouth every 6 (six) hours as needed for mild pain (or Fever >/= 101). 30 tablet 0  . acyclovir (ZOVIRAX) 800 MG tablet Take 1 tablet (800 mg total) by mouth 2 (two) times daily. 60 tablet 2  . dexamethasone (DECADRON) 4 MG tablet Take 10 tablets (40 mg total) by mouth once a week. 40 tablet 2  . entecavir (BARACLUDE) 0.5 MG tablet Take 0.5 mg by mouth daily.    . feeding supplement (BOOST / RESOURCE BREEZE) LIQD Take 1 Container by mouth 3 (three) times daily between meals. (Patient not taking: Reported on 09/01/2016) 237 mL 0  . lidocaine-prilocaine (EMLA) cream Apply 1 application topically as directed.  0  . magic mouthwash w/lidocaine SOLN Take 15 mLs by mouth 4 (four) times daily as needed for mouth pain. (Patient not taking: Reported on 09/01/2016) 150 mL 0  . metFORMIN (GLUCOPHAGE) 500 MG tablet Take 1 tablet (500 mg  total) by mouth 2 (two) times daily with a meal. 60 tablet 2  . omeprazole (PRILOSEC) 20 MG capsule Take 1 capsule (20 mg total) by mouth daily. 30 capsule 3  . ondansetron (ZOFRAN) 8 MG tablet Take 1 tablet (8 mg total) by mouth every 8 (eight) hours as needed for nausea or vomiting. 30 tablet 1  . polyethylene glycol (MIRALAX / GLYCOLAX) packet Take 17 g by mouth daily as needed.    . promethazine (PHENERGAN) 25 MG tablet Take  1 tablet (25 mg total) by mouth every 6 (six) hours as needed for nausea or vomiting. 30 tablet 0   No current facility-administered medications for this visit.    Facility-Administered Medications Ordered in Other Visits  Medication Dose Route Frequency Provider Last Rate Last Dose  . sodium chloride 0.9 % injection 10 mL  10 mL Intravenous PRN Truitt Merle, MD   10 mL at 12/11/15 1532  . sodium chloride 0.9 % injection 10 mL  10 mL Intravenous PRN Truitt Merle, MD   10 mL at 06/11/16 1505  . sodium chloride flush (NS) 0.9 % injection 10 mL  10 mL Intravenous PRN Truitt Merle, MD   10 mL at 10/09/15 1717    REVIEW OF SYSTEMS:  Constitutional: no abnormal night sweats (+) fatigue Eyes: Denies blurriness of vision, double vision or watery eyes  Ears, nose, mouth, throat, and face: Denies mucositis (-) Sore throat  Respiratory: Denies, dyspnea or wheezes  Cardiovascular: Denies palpitation, chest discomfort or lower extremity swelling Gastrointestinal:  Denies nausea, heartburn, reports regular bowel/bladder Skin: Denies abnormal skin rashes Lymphatics: Denies new lymphadenopathy or easy bruising Neurological:Denies numbness, tingling or new weaknesses Behavioral/Psych: Mood is stable, no new changes  All other systems were reviewed with the patient and are negative.  PHYSICAL EXAMINATION:   ECOG PERFORMANCE STATUS: 1 Vitals:   09/29/16 0955  BP: 110/68  Pulse: 88  Resp: 18  Temp: 98.7 F (37.1 C)  TempSrc: Oral  SpO2: 100%  Weight: 127 lb 6.4 oz (57.8 kg)   Height: '5\' 2"'  (1.575 m)    GENERAL:alert, no distress and comfortable SKIN: skin color, texture, turgor are normal, no rashes or significant lesions EYES: normal, conjunctiva are pink and non-injected, sclera clear OROPHARYNX:no exudate, no erythema and lips, buccal mucosa  NECK: supple, thyroid normal size, non-tender, without nodularity LYMPH:  no palpable lymphadenopathy in the cervical, axillary or inguinal LUNGS: clear to auscultation and percussion with normal breathing effort HEART: regular rate & rhythm and no murmurs and no lower extremity edema ABDOMEN:abdomen soft, mildly tender, no hepatomegaly  Musculoskeletal:no cyanosis of digits and no clubbing  PSYCH: alert & oriented x 3 with fluent speech NEURO: no focal motor/sensory deficits  LABORATORY DATA:  I have reviewed the data as listed CBC Latest Ref Rng & Units 09/29/2016 09/22/2016 09/15/2016  WBC 3.9 - 10.3 10e3/uL 3.7(L) 7.1 3.2(L)  Hemoglobin 11.6 - 15.9 g/dL 10.5(L) 10.4(L) 10.8(L)  Hematocrit 34.8 - 46.6 % 34.4(L) 33.3(L) 33.6(L)  Platelets 145 - 400 10e3/uL 193 208 191    CMP Latest Ref Rng & Units 09/29/2016 09/22/2016 09/15/2016  Glucose 70 - 140 mg/dl 185(H) 151(H) 157(H)  BUN 7.0 - 26.0 mg/dL 13.3 11.8 13.2  Creatinine 0.6 - 1.1 mg/dL 0.7 0.7 0.7  Sodium 136 - 145 mEq/L 141 140 140  Potassium 3.5 - 5.1 mEq/L 4.1 3.9 4.2  Chloride 101 - 111 mmol/L - - -  CO2 22 - 29 mEq/L '22 24 22  ' Calcium 8.4 - 10.4 mg/dL 9.6 9.1 9.4  Total Protein 6.4 - 8.3 g/dL 7.1 6.9 7.1  Total Bilirubin 0.20 - 1.20 mg/dL 0.26 <0.22 0.22  Alkaline Phos 40 - 150 U/L 112 133 139  AST 5 - 34 U/L 22 33 131(H)  ALT 0 - 55 U/L 12 22 60(H)    SPEP M-protein  09/15/2014: 4.2 10/08/14: 4.6 12/08/2014: 0.2 02/15/2015: not sufficient sample for test   09/11/2015: not observed 10/09/2015: not det   11/06/2015: not det  12/25/2015: not  det  02/13/2016: 0.3 03/12/2016: 0.8 04/09/2016: 0.6 05/08/16: 0.1 06/04/16: 0.1 07/02/2016: 0.2 07/09/2016: 0.2 (dara  specific IFE negative) 08/05/16: Not observed  09/01/16: Not observed   IgG mg/dl 11/03/2014: 3150  12/08/2014:  759 02/14/2014: 860 09/11/2015: 1347 10/09/2015: 1457 11/06/2015: 1504 12/25/2015: 1400 02/13/2016: 1543 03/12/2016: 1860 04/09/2016: 1620 05/08/16: 749 06/04/16: 751 5/30/20187: 796 07/09/2016: 701 08/05/16: 678 09/01/16: 650   Kappa/lambda light chains levels and ration  11/03/14: 0.75, 152, 0.00 12/22/2014: 1.10, 2.60, 0.42 02/15/2015: 9.59,14.35, 0.67 09/11/2015: 2.44, 2.72, 0.90 10/09/2015: 3.09, 3.49, 0.89 11/06/2015: 2.30, 2.62, 0.88 12/25/2015: 2.51, 3.0, 0.84 02/13/2016: 2.64, 15.3, 0.17 03/12/2016: 2.27, 45.5, 0.05 04/09/2016: 3.33, 45.5, 0.07 05/08/2016: 0.49, 1.21, 0.40 06/04/2016: 0.83, 1.11, 0.75 07/02/16: 0.51, 1.43, 0.36 07/23/16: 0.68, 1.02, 0.67 08/05/16: 0.62, 10. 9, 0.57 09/01/16: 8.0, 12.1, 0.66   24 h urine UPEP/IFE and light chain: 11/03/2014: IFE showed a monoclonal IgG heavy chain with associated lambda light chain. M protein was Undetectable   PATHOLOGY REPORT  Bone Marrow (BM) and Peripheral Blood (PB) FINAL PATHOLOGIC DIAGNOSIS  BONE MARROW: 07/11/2015 Hypocellular marrow (20%) with no increase in plasma cells (2%). See comment.  PERIPHERAL BLOOD: Mild anemia. No circulating plasma cells identified. See comment and CBC data.  BONE MARROW: 02/22/2016 Diagnosis Bone Marrow, Aspirate,Biopsy, and Clot, right iliac - NORMOCELLULAR BONE MARROW FOR AGE WITH TRILINEAGE HEMATOPOIESIS. - PLASMACYTOSIS (PLASMA CELLS 12%). - SEE COMMENT. PERIPHERAL BLOOD: - OCCASIONAL CIRCULATING PLASMA CELLS.  RADIOGRAPHIC STUDIES: I have personally reviewed the radiological images as listed and agreed with the findings in the report. No results found.   CXR 05/08/2016 IMPRESSION:  No acute disease.  CT Biopsy 02/22/16 IMPRESSION: 1. Technically successful CT guided right iliac bone core and aspiration biopsy.   ASSESSMENT & PLAN: 46 y.o. [place] woman, presented with  anemia and leokocytosis   1. 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 Unfortunately this showed increased plasma cells 12% with additional lambda light chain staining pending. This is relapse of disease. Cytogenetics was normal  -Her M protein and lambda free light chain level has significantly increased lately, consistent with disease relapse -Her recent LP showed negative CSF, no evidence of CNS involvement. -I have previously spoken with Dr. Norma Fredrickson, we agreed to change her CyBorD to weekly, and add daratumumab weekly, starting today. Potential side effects would discussed with patient, especially cytopenia and infusion reaction, she will take dexamethasone the day before Dara infusion.  -If her disease does not respond to treatment quickly, or if she has intolerance issue to cyBorD, I will change to pomalidomide,  dexamethasone and Dara  -continue acyclovir  -Continue Zometa every 4 weeks for a total of 2 years (until 11/2017) - Due to her hospitalization for severe cytopenia and infection, Cytoxan has been held. -she has restarted weekly Velcade and dexa, and now on Dara every 2 weeks, tolerating well, will continue. Her recent dara-specific IFE was negative for M-protein on 07/07/16 at [facility], she has achieved second CR - continue Velcade injection and dexa 57m weekly, and Dara infusion every 2 weeks, will change to every 4 weeks as maintenance therapy after next infusion in 2 weeks  - she is responding to treatment well, SPEP M protein has been undetectable since early July 2018 -Today  She is clinically doing well, exam was unremarkable, lab results reviewed with her. Her WBC is slightly low but  adequate to continue treatment.  -I discussed to take dexamethasone 42m weekly on the day before getting Velcade injection -I [facility]. Type 2 diabetes, steroids induced  -She was noticed to have increased blood glucose level lately, with random blood glucose above 200. She also developed diabetic symptoms. No history of diabetes in the past. -This is likely steroids induced hyperglycemia -I strongly recommend her to avoid any soft drink and sweats, and watch her carbohydrate intake, and exercise more  -She has been seen by her primary care physician. She was not given any medication to manage her glucose. I will call her primary care physician to discuss adding metformin. -I previously instructed her to take metformin every day since she is now taking dexamethasone more frequently.  -She restarted her dexamethasone weekly as part of his leukemia treatment, and she will also receive steroids as premedication, I recommend her to take metformin 500 mg twice daily continuously, to control her hyperglycemia.Potential side effects discussed, she agreed to proceed. -Continue close monitoring. -We previously discussed restarting to metformin one pill a day while on steroids to see if her levels will stabilize. I previously encouraged her to take it in the morning with breakfast.  -Due to increase in steroids, Pt will increase metformin to twice daily. I again discussed with her, she voiced understanding and will change from daily to twice daily   3. Chronic Hepatitis B  - [facility], but she run out 2 weeks ago and has not refilled due to high copay.  Our nursing staff will call her GI physician's office to request this for her. - [facility] -She has not taken medication in 2-3 months  -She is able to afford her $250 copay, but still not  able to get in touch with GI clinic. [facility] nurse has [facility] to request refill for her but it has not been refilled  - Pt has not gotten her medication refilled. I have [place] to [place]   4. GERD, recent GI bleeding and nausea  -Continue omeprazole daily. We previously discussed steroids can worsen her acid reflux. -[facility]  -- she did not do her colonoscopy because she did not have anyone to accompany her during visit. They will call to reschedule.   5. Constipation -I previously encouraged the patient to try senca-s, ducalax 1-3 times a day or milk of magnesium if miralax doresn't help with constipation  - I previously advised the patient to take something everyday to help with constipation.  6. Fever 09/28/2016 -She had an episode of fever and chills yesterday at home, resolved after taking 1 tablet of Tylenol. -She is afebrile and feels better today. His exam was unremarkable for any localized infection. -Her WBC Tylenol, she possibly has various infection. I encouraged her to continue monitoring her temperature at home, and call me back if she has recurrent fever.  Plan  -Lab reviewed, adequate for treatment, we'll proceed to Dara infusion and Velcade injection today -We'll continue Velcade injection every week along with dexamethasone 40 mg, next Dara in 2 weeks, will change to every 4 weeks after next infusion  -  Lab and flush, and f/u on 9/24  - ID referral was made today for her Hep B treatment and f/u    All questions were answered. The patient knows to call the clinic with any problems, questions or concerns.  I spent 20 minutes counseling the patient face to face. We used the vedio interpreter service. The total time spent in the appointment was 20 minutes and more than 50% was on counseling.   This document serves as a record of services personally performed by Truitt Merle, MD. It was  created on her behalf by Margit Banda, a trained medical scribe. The creation of this record is based on the scribe's personal observations and the provider's statements to them. This document has been checked and approved by the attending provider.     Truitt Merle, MD  09/29/2016

## 2016-09-29 NOTE — Telephone Encounter (Signed)
Gave patient avs and calendar with upcoming appts.  °

## 2016-09-29 NOTE — Patient Instructions (Signed)
[facility] Discharge Instructions for Patients Receiving Chemotherapy  Today you received the following chemotherapy agents: Velcade and Darzalex.   To help prevent nausea and vomiting after your treatment, we encourage you to take your nausea medication as directed.   If you develop nausea and vomiting that is not controlled by your nausea medication, call the clinic.   BELOW ARE SYMPTOMS THAT SHOULD BE REPORTED IMMEDIATELY:  *FEVER GREATER THAN 100.5 F  *CHILLS WITH OR WITHOUT FEVER  NAUSEA AND VOMITING THAT IS NOT CONTROLLED WITH YOUR NAUSEA MEDICATION  *UNUSUAL SHORTNESS OF BREATH  *UNUSUAL BRUISING OR BLEEDING  TENDERNESS IN MOUTH AND THROAT WITH OR WITHOUT PRESENCE OF ULCERS  *URINARY PROBLEMS  *BOWEL PROBLEMS  UNUSUAL RASH Items with * indicate a potential emergency and should be followed up as soon as possible.  Feel free to call the clinic you have any questions or concerns. The clinic phone number is [phone].  Please show the CHEMO ALERT CARD at check-in to the Emergency Department and triage nurse.   

## 2016-09-30 LAB — PROTEIN ELECTROPHORESIS, SERUM
A/G Ratio: 1.3 (ref 0.7–1.7)
ALBUMIN: 3.7 g/dL (ref 2.9–4.4)
ALPHA 2: 1 g/dL (ref 0.4–1.0)
Alpha 1: 0.3 g/dL (ref 0.0–0.4)
BETA: 1 g/dL (ref 0.7–1.3)
Gamma Globulin: 0.7 g/dL (ref 0.4–1.8)
Globulin, Total: 2.9 g/dL (ref 2.2–3.9)
M-Spike, %: 0.1 g/dL — ABNORMAL HIGH
Total Protein: 6.6 g/dL (ref 6.0–8.5)

## 2016-09-30 LAB — KAPPA/LAMBDA LIGHT CHAINS
IG LAMBDA FREE LIGHT CHAIN: 10.7 mg/L (ref 5.7–26.3)
Ig Kappa Free Light Chain: 4.4 mg/L (ref 3.3–19.4)
KAPPA/LAMBDA FLC RATIO: 0.41 (ref 0.26–1.65)

## 2016-10-02 ENCOUNTER — Telehealth: Payer: Self-pay | Admitting: *Deleted

## 2016-10-02 NOTE — Telephone Encounter (Signed)
Received message from daughter [name] reporting pt has increased temp and abdominal pain.  Daughter requested antibiotics to be prescribed for pt.   Called pt back and spoke with daughter.  Pt is at work at present.  Daughter unable to give nurse any clear information.  Instructed Goi to have pt call office back now ASAP.  Goi  Voiced understanding.

## 2016-10-06 LAB — IFE, DARA-SPECIFIC, SERUM
IGA/IMMUNOGLOBULIN A, SERUM: 26 mg/dL — AB (ref 87–352)
IGM (IMMUNOGLOBIN M), SRM: 25 mg/dL — AB (ref 26–217)
IgG, Qn, Serum: 727 mg/dL (ref 700–1600)

## 2016-10-07 ENCOUNTER — Ambulatory Visit (HOSPITAL_BASED_OUTPATIENT_CLINIC_OR_DEPARTMENT_OTHER): Payer: Medicaid Other

## 2016-10-07 ENCOUNTER — Other Ambulatory Visit (HOSPITAL_BASED_OUTPATIENT_CLINIC_OR_DEPARTMENT_OTHER): Payer: Medicaid Other

## 2016-10-07 ENCOUNTER — Ambulatory Visit: Payer: Medicaid Other

## 2016-10-07 VITALS — BP 112/77 | HR 92 | Temp 98.3°F | Resp 18

## 2016-10-07 DIAGNOSIS — Z95828 Presence of other vascular implants and grafts: Secondary | ICD-10-CM

## 2016-10-07 DIAGNOSIS — C9012 Plasma cell leukemia in relapse: Secondary | ICD-10-CM

## 2016-10-07 DIAGNOSIS — Z5112 Encounter for antineoplastic immunotherapy: Secondary | ICD-10-CM | POA: Diagnosis not present

## 2016-10-07 DIAGNOSIS — C9010 Plasma cell leukemia not having achieved remission: Secondary | ICD-10-CM

## 2016-10-07 LAB — COMPREHENSIVE METABOLIC PANEL
ALBUMIN: 3.1 g/dL — AB (ref 3.5–5.0)
ALK PHOS: 168 U/L — AB (ref 40–150)
ALT: 13 U/L (ref 0–55)
AST: 23 U/L (ref 5–34)
Anion Gap: 8 mEq/L (ref 3–11)
BUN: 8.6 mg/dL (ref 7.0–26.0)
CALCIUM: 9.1 mg/dL (ref 8.4–10.4)
CHLORIDE: 109 meq/L (ref 98–109)
CO2: 24 mEq/L (ref 22–29)
Creatinine: 0.7 mg/dL (ref 0.6–1.1)
GLUCOSE: 179 mg/dL — AB (ref 70–140)
POTASSIUM: 3.8 meq/L (ref 3.5–5.1)
SODIUM: 140 meq/L (ref 136–145)
Total Bilirubin: <0.22 mg/dL (ref 0.20–1.20)
Total Protein: 6.6 g/dL (ref 6.4–8.3)

## 2016-10-07 LAB — CBC WITH DIFFERENTIAL/PLATELET
BASO%: 0 % (ref 0.0–2.0)
BASOS ABS: 0 10*3/uL (ref 0.0–0.1)
EOS ABS: 0 10*3/uL (ref 0.0–0.5)
EOS%: 0 % (ref 0.0–7.0)
HCT: 30.8 % — ABNORMAL LOW (ref 34.8–46.6)
HEMOGLOBIN: 9.4 g/dL — AB (ref 11.6–15.9)
LYMPH%: 6.3 % — ABNORMAL LOW (ref 14.0–49.7)
MCH: 26 pg (ref 25.1–34.0)
MCHC: 30.5 g/dL — AB (ref 31.5–36.0)
MCV: 85.1 fL (ref 79.5–101.0)
MONO#: 0.1 10*3/uL (ref 0.1–0.9)
MONO%: 2.8 % (ref 0.0–14.0)
NEUT#: 4.2 10*3/uL (ref 1.5–6.5)
NEUT%: 90.9 % — AB (ref 38.4–76.8)
Platelets: 206 10*3/uL (ref 145–400)
RBC: 3.62 10*6/uL — ABNORMAL LOW (ref 3.70–5.45)
RDW: 14.5 % (ref 11.2–14.5)
WBC: 4.6 10*3/uL (ref 3.9–10.3)
lymph#: 0.3 10*3/uL — ABNORMAL LOW (ref 0.9–3.3)

## 2016-10-07 MED ORDER — BORTEZOMIB CHEMO SQ INJECTION 3.5 MG (2.5MG/ML)
1.5000 mg/m2 | Freq: Once | INTRAMUSCULAR | Status: AC
  Administered 2016-10-07: 2.5 mg via SUBCUTANEOUS
  Filled 2016-10-07: qty 2.5

## 2016-10-07 MED ORDER — SODIUM CHLORIDE 0.9 % IJ SOLN
10.0000 mL | INTRAMUSCULAR | Status: DC | PRN
  Administered 2016-10-07 (×2): 10 mL via INTRAVENOUS
  Filled 2016-10-07: qty 10

## 2016-10-07 MED ORDER — SODIUM CHLORIDE 0.9 % IV SOLN
Freq: Once | INTRAVENOUS | Status: AC
  Administered 2016-10-07: 14:00:00 via INTRAVENOUS

## 2016-10-07 MED ORDER — SODIUM CHLORIDE 0.9% FLUSH
10.0000 mL | INTRAVENOUS | Status: DC | PRN
  Administered 2016-10-07: 10 mL
  Filled 2016-10-07: qty 10

## 2016-10-07 MED ORDER — ZOLEDRONIC ACID 4 MG/100ML IV SOLN
4.0000 mg | Freq: Once | INTRAVENOUS | Status: AC
Start: 2016-10-07 — End: 2016-10-07
  Administered 2016-10-07: 4 mg via INTRAVENOUS
  Filled 2016-10-07: qty 100

## 2016-10-07 MED ORDER — HEPARIN SOD (PORK) LOCK FLUSH 100 UNIT/ML IV SOLN
500.0000 [IU] | Freq: Once | INTRAVENOUS | Status: AC | PRN
  Administered 2016-10-07: 500 [IU]
  Filled 2016-10-07: qty 5

## 2016-10-07 MED ORDER — HEPARIN SOD (PORK) LOCK FLUSH 100 UNIT/ML IV SOLN
500.0000 [IU] | Freq: Once | INTRAVENOUS | Status: AC | PRN
Start: 2016-10-07 — End: 2016-10-07
  Administered 2016-10-07: 500 [IU] via INTRAVENOUS
  Filled 2016-10-07: qty 5

## 2016-10-07 NOTE — Patient Instructions (Signed)
[facility] Discharge Instructions for Patients Receiving Chemotherapy  Today you received the following chemotherapy agents: Velcade   To help prevent nausea and vomiting after your treatment, we encourage you to take your nausea medication as directed.    If you develop nausea and vomiting that is not controlled by your nausea medication, call the clinic.   BELOW ARE SYMPTOMS THAT SHOULD BE REPORTED IMMEDIATELY:  *FEVER GREATER THAN 100.5 F  *CHILLS WITH OR WITHOUT FEVER  NAUSEA AND VOMITING THAT IS NOT CONTROLLED WITH YOUR NAUSEA MEDICATION  *UNUSUAL SHORTNESS OF BREATH  *UNUSUAL BRUISING OR BLEEDING  TENDERNESS IN MOUTH AND THROAT WITH OR WITHOUT PRESENCE OF ULCERS  *URINARY PROBLEMS  *BOWEL PROBLEMS  UNUSUAL RASH Items with * indicate a potential emergency and should be followed up as soon as possible.  Feel free to call the clinic you have any questions or concerns. The clinic phone number is [phone].  Please show the [place] at check-in to the Emergency Department and triage nurse.    Zoledronic Acid injection (Hypercalcemia, Oncology) What is this medicine? ZOLEDRONIC ACID (ZOE le dron ik AS id) lowers the amount of calcium loss from bone. It is used to treat too much calcium in your blood from cancer. It is also used to prevent complications of cancer that has spread to the bone. This medicine may be used for other purposes; ask your health care provider or pharmacist if you have questions. COMMON BRAND NAME(S): Zometa What should I tell my health care provider before I take this medicine? They need to know if you have any of these conditions: -aspirin-sensitive asthma -cancer, especially if you are receiving medicines used to treat cancer -dental disease or wear dentures -infection -kidney disease -receiving corticosteroids like dexamethasone or prednisone -an unusual or allergic reaction to zoledronic acid, other medicines,  foods, dyes, or preservatives -pregnant or trying to get pregnant -breast-feeding How should I use this medicine? This medicine is for infusion into a vein. It is given by a health care professional in a hospital or clinic setting. Talk to your pediatrician regarding the use of this medicine in children. Special care may be needed. Overdosage: If you think you have taken too much of this medicine contact a poison control center or emergency room at once. NOTE: This medicine is only for you. Do not share this medicine with others. What if I miss a dose? It is important not to miss your dose. Call your doctor or health care professional if you are unable to keep an appointment. What may interact with this medicine? -certain antibiotics given by injection -NSAIDs, medicines for pain and inflammation, like ibuprofen or naproxen -some diuretics like bumetanide, furosemide -teriparatide -thalidomide This list may not describe all possible interactions. Give your health care provider a list of all the medicines, herbs, non-prescription drugs, or dietary supplements you use. Also tell them if you smoke, drink alcohol, or use illegal drugs. Some items may interact with your medicine. What should I watch for while using this medicine? Visit your doctor or health care professional for regular checkups. It may be some time before you see the benefit from this medicine. Do not stop taking your medicine unless your doctor tells you to. Your doctor may order blood tests or other tests to see how you are doing. Women should inform their doctor if they wish to become pregnant or think they might be pregnant. There is a potential for serious side effects to an unborn  child. Talk to your health care professional or pharmacist for more information. You should make sure that you get enough calcium and vitamin D while you are taking this medicine. Discuss the foods you eat and the vitamins you take with your health  care professional. Some people who take this medicine have severe bone, joint, and/or muscle pain. This medicine may also increase your risk for jaw problems or a broken thigh bone. Tell your doctor right away if you have severe pain in your jaw, bones, joints, or muscles. Tell your doctor if you have any pain that does not go away or that gets worse. Tell your dentist and dental surgeon that you are taking this medicine. You should not have major dental surgery while on this medicine. See your dentist to have a dental exam and fix any dental problems before starting this medicine. Take good care of your teeth while on this medicine. Make sure you see your dentist for regular follow-up appointments. What side effects may I notice from receiving this medicine? Side effects that you should report to your doctor or health care professional as soon as possible: -allergic reactions like skin rash, itching or hives, swelling of the face, lips, or tongue -anxiety, confusion, or depression -breathing problems -changes in vision -eye pain -feeling faint or lightheaded, falls -jaw pain, especially after dental work -mouth sores -muscle cramps, stiffness, or weakness -redness, blistering, peeling or loosening of the skin, including inside the mouth -trouble passing urine or change in the amount of urine Side effects that usually do not require medical attention (report to your doctor or health care professional if they continue or are bothersome): -bone, joint, or muscle pain -constipation -diarrhea -fever -hair loss -irritation at site where injected -loss of appetite -nausea, vomiting -stomach upset -trouble sleeping -trouble swallowing -weak or tired This list may not describe all possible side effects. Call your doctor for medical advice about side effects. You may report side effects to FDA at 1-800-FDA-1088. Where should I keep my medicine? This drug is given in a hospital or clinic and  will not be stored at home. NOTE: This sheet is a summary. It may not cover all possible information. If you have questions about this medicine, talk to your doctor, pharmacist, or health care provider.  2018 Elsevier/Gold Standard (2013-06-18 14:19:39)

## 2016-10-13 ENCOUNTER — Ambulatory Visit (HOSPITAL_BASED_OUTPATIENT_CLINIC_OR_DEPARTMENT_OTHER): Payer: Medicaid Other

## 2016-10-13 ENCOUNTER — Other Ambulatory Visit (HOSPITAL_BASED_OUTPATIENT_CLINIC_OR_DEPARTMENT_OTHER): Payer: Medicaid Other

## 2016-10-13 ENCOUNTER — Ambulatory Visit: Payer: Medicaid Other

## 2016-10-13 VITALS — BP 107/64 | HR 86 | Temp 97.6°F | Resp 18

## 2016-10-13 DIAGNOSIS — C9010 Plasma cell leukemia not having achieved remission: Secondary | ICD-10-CM

## 2016-10-13 DIAGNOSIS — Z95828 Presence of other vascular implants and grafts: Secondary | ICD-10-CM

## 2016-10-13 DIAGNOSIS — Z5112 Encounter for antineoplastic immunotherapy: Secondary | ICD-10-CM | POA: Diagnosis present

## 2016-10-13 DIAGNOSIS — C9012 Plasma cell leukemia in relapse: Secondary | ICD-10-CM

## 2016-10-13 LAB — COMPREHENSIVE METABOLIC PANEL
ALBUMIN: 3.2 g/dL — AB (ref 3.5–5.0)
ALK PHOS: 129 U/L (ref 40–150)
ALT: 12 U/L (ref 0–55)
AST: 27 U/L (ref 5–34)
Anion Gap: 9 mEq/L (ref 3–11)
BUN: 9.6 mg/dL (ref 7.0–26.0)
CALCIUM: 9.1 mg/dL (ref 8.4–10.4)
CO2: 23 mEq/L (ref 22–29)
CREATININE: 0.7 mg/dL (ref 0.6–1.1)
Chloride: 109 mEq/L (ref 98–109)
EGFR: >90 mL/min/{1.73_m2} (ref >90)
Glucose: 105 mg/dl (ref 70–140)
Potassium: 4.1 mEq/L (ref 3.5–5.1)
Sodium: 141 mEq/L (ref 136–145)
TOTAL PROTEIN: 6.5 g/dL (ref 6.4–8.3)
Total Bilirubin: 0.32 mg/dL (ref 0.20–1.20)

## 2016-10-13 LAB — CBC WITH DIFFERENTIAL/PLATELET
BASO%: 0.1 % (ref 0.0–2.0)
BASOS ABS: 0 10*3/uL (ref 0.0–0.1)
EOS%: 0.1 % (ref 0.0–7.0)
Eosinophils Absolute: 0 10*3/uL (ref 0.0–0.5)
HEMATOCRIT: 32.1 % — AB (ref 34.8–46.6)
HEMOGLOBIN: 9.8 g/dL — AB (ref 11.6–15.9)
LYMPH#: 0.6 10*3/uL — AB (ref 0.9–3.3)
LYMPH%: 8 % — ABNORMAL LOW (ref 14.0–49.7)
MCH: 26.1 pg (ref 25.1–34.0)
MCHC: 30.5 g/dL — ABNORMAL LOW (ref 31.5–36.0)
MCV: 85.6 fL (ref 79.5–101.0)
MONO#: 0.2 10*3/uL (ref 0.1–0.9)
MONO%: 2.2 % (ref 0.0–14.0)
NEUT%: 89.6 % — ABNORMAL HIGH (ref 38.4–76.8)
NEUTROS ABS: 6.2 10*3/uL (ref 1.5–6.5)
Platelets: 192 10*3/uL (ref 145–400)
RBC: 3.75 10*6/uL (ref 3.70–5.45)
RDW: 15.1 % — ABNORMAL HIGH (ref 11.2–14.5)
WBC: 6.9 10*3/uL (ref 3.9–10.3)

## 2016-10-13 MED ORDER — HEPARIN SOD (PORK) LOCK FLUSH 100 UNIT/ML IV SOLN
500.0000 [IU] | Freq: Once | INTRAVENOUS | Status: AC | PRN
  Administered 2016-10-13: 500 [IU]
  Filled 2016-10-13: qty 5

## 2016-10-13 MED ORDER — PROCHLORPERAZINE MALEATE 10 MG PO TABS
ORAL_TABLET | ORAL | Status: AC
  Filled 2016-10-13: qty 1

## 2016-10-13 MED ORDER — SODIUM CHLORIDE 0.9% FLUSH
10.0000 mL | INTRAVENOUS | Status: DC | PRN
  Administered 2016-10-13: 10 mL
  Filled 2016-10-13: qty 10

## 2016-10-13 MED ORDER — SODIUM CHLORIDE 0.9 % IV SOLN
16.0000 mg/kg | Freq: Once | INTRAVENOUS | Status: AC
  Administered 2016-10-13: 900 mg via INTRAVENOUS
  Filled 2016-10-13: qty 40

## 2016-10-13 MED ORDER — DIPHENHYDRAMINE HCL 25 MG PO CAPS
ORAL_CAPSULE | ORAL | Status: AC
  Filled 2016-10-13: qty 1

## 2016-10-13 MED ORDER — ALTEPLASE 2 MG IJ SOLR
2.0000 mg | Freq: Once | INTRAMUSCULAR | Status: DC | PRN
  Filled 2016-10-13: qty 2

## 2016-10-13 MED ORDER — PROCHLORPERAZINE MALEATE 10 MG PO TABS
10.0000 mg | ORAL_TABLET | Freq: Once | ORAL | Status: AC
  Administered 2016-10-13: 10 mg via ORAL

## 2016-10-13 MED ORDER — DIPHENHYDRAMINE HCL 25 MG PO CAPS
25.0000 mg | ORAL_CAPSULE | Freq: Once | ORAL | Status: AC
  Administered 2016-10-13: 25 mg via ORAL

## 2016-10-13 MED ORDER — METHYLPREDNISOLONE SODIUM SUCC 125 MG IJ SOLR
125.0000 mg | Freq: Once | INTRAMUSCULAR | Status: AC
  Administered 2016-10-13: 125 mg via INTRAVENOUS

## 2016-10-13 MED ORDER — SODIUM CHLORIDE 0.9 % IV SOLN: Freq: Once | INTRAVENOUS | Status: DC

## 2016-10-13 MED ORDER — ACETAMINOPHEN 325 MG PO TABS
ORAL_TABLET | ORAL | Status: AC
  Filled 2016-10-13: qty 2

## 2016-10-13 MED ORDER — SODIUM CHLORIDE 0.9 % IV SOLN
Freq: Once | INTRAVENOUS | Status: AC
  Administered 2016-10-13: 11:00:00 via INTRAVENOUS

## 2016-10-13 MED ORDER — ACETAMINOPHEN 325 MG PO TABS
650.0000 mg | ORAL_TABLET | Freq: Once | ORAL | Status: AC
  Administered 2016-10-13: 650 mg via ORAL

## 2016-10-13 MED ORDER — METHYLPREDNISOLONE SODIUM SUCC 125 MG IJ SOLR
INTRAMUSCULAR | Status: AC
  Filled 2016-10-13: qty 2

## 2016-10-13 MED ORDER — BORTEZOMIB CHEMO SQ INJECTION 3.5 MG (2.5MG/ML)
1.5000 mg/m2 | Freq: Once | INTRAMUSCULAR | Status: AC
  Administered 2016-10-13: 2.5 mg via SUBCUTANEOUS
  Filled 2016-10-13: qty 2.5

## 2016-10-13 NOTE — Patient Instructions (Signed)
[facility] Discharge Instructions for Patients Receiving Chemotherapy  Today you received the following chemotherapy agents: Velcade and Darzalex.   To help prevent nausea and vomiting after your treatment, we encourage you to take your nausea medication as directed.   If you develop nausea and vomiting that is not controlled by your nausea medication, call the clinic.   BELOW ARE SYMPTOMS THAT SHOULD BE REPORTED IMMEDIATELY:  *FEVER GREATER THAN 100.5 F  *CHILLS WITH OR WITHOUT FEVER  NAUSEA AND VOMITING THAT IS NOT CONTROLLED WITH YOUR NAUSEA MEDICATION  *UNUSUAL SHORTNESS OF BREATH  *UNUSUAL BRUISING OR BLEEDING  TENDERNESS IN MOUTH AND THROAT WITH OR WITHOUT PRESENCE OF ULCERS  *URINARY PROBLEMS  *BOWEL PROBLEMS  UNUSUAL RASH Items with * indicate a potential emergency and should be followed up as soon as possible.  Feel free to call the clinic you have any questions or concerns. The clinic phone number is [phone].  Please show the CHEMO ALERT CARD at check-in to the Emergency Department and triage nurse.   

## 2016-10-17 ENCOUNTER — Ambulatory Visit (INDEPENDENT_AMBULATORY_CARE_PROVIDER_SITE_OTHER): Payer: Medicaid Other | Admitting: Gastroenterology

## 2016-10-17 ENCOUNTER — Encounter: Payer: Self-pay | Admitting: Gastroenterology

## 2016-10-17 VITALS — BP 86/60 | HR 88 | Ht 62.0 in | Wt 128.5 lb

## 2016-10-17 DIAGNOSIS — K625 Hemorrhage of anus and rectum: Secondary | ICD-10-CM | POA: Diagnosis not present

## 2016-10-17 MED ORDER — NA SULFATE-K SULFATE-MG SULF 17.5-3.13-1.6 GM/177ML PO SOLN: 1.0000 | Freq: Once | ORAL | 0 refills | Status: AC

## 2016-10-17 NOTE — Patient Instructions (Addendum)
You will be set up for a colonoscopy for rectal bleeding.  Normal BMI (Body Mass Index- based on height and weight) is between 19 and 25. Your BMI today is Body mass index is 23.5 kg/m. Marland Kitchen Please consider follow up  regarding your BMI with your Primary Care Provider.

## 2016-10-17 NOTE — Progress Notes (Signed)
HPI: This is a  very pleasant 46 year old [place] speaking woman who is here with her daughter who acts as a Optometrist. I saw her about 8 months ago  Chief complaint is minor rectal bleeding, minor constipation   I last saw her about 8 months ago for minor rectal bleeding. Her hemoglobin was 10.4. We arranged for colonoscopy from that visit however she canceled because she did not have a ride.  With her daughter again  Last red bleeding was 3 months ago.  She does tend have minor constipation.  Bloated.   Hb last week 9.8  ROS: complete GI ROS as described in HPI, all other review negative.  Constitutional:  No unintentional weight loss   Past Medical History:  Diagnosis Date  . Chills with fever    intermittently since d/c from hospital  . Dysuria-frequency syndrome    w/ pink urine  . GERD (gastroesophageal reflux disease)   . Hepatitis   . History of positive PPD    DX 2011--  CXR DONE NO EVIDENCE  . History of ureter stent   . Hydronephrosis, right   . Neuromuscular disorder (HCC)    legs numb intermittently  . 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 Pneumonia   . Right ureteral stone   . Urosepsis 8/14   admitted to [facility]    Past Surgical History:  Procedure Laterality Date  . CYSTOSCOPY W/ URETERAL STENT PLACEMENT Right 09/25/2012   Procedure: CYSTOSCOPY WITH RETROGRADE PYELOGRAM/URETERAL STENT PLACEMENT;  Surgeon: Alexis Frock, MD;  Location: [facility];  Service: Urology;  Laterality: Right;  . CYSTOSCOPY WITH RETROGRADE PYELOGRAM, URETEROSCOPY AND STENT PLACEMENT Right 10/15/2012   Procedure: CYSTOSCOPY WITH RETROGRADE PYELOGRAM, URETEROSCOPY AND REMOVAL STENT WITH  STENT PLACEMENT;  Surgeon: Alexis Frock, MD;  Location: [facility];  Service: Urology;  Laterality: Right;  . ESOPHAGOGASTRODUODENOSCOPY (EGD) WITH PROPOFOL N/A 11/16/2014   Procedure: ESOPHAGOGASTRODUODENOSCOPY (EGD) WITH PROPOFOL;  Surgeon: Milus Banister, MD;  Location: [facility]  END[facility]COPY;  Service: Endoscopy;  Laterality: N/A;  . HOLMIUM LASER APPLICATION Right 5/40/0867   Procedure: HOLMIUM LASER APPLICATION;  Surgeon: Alexis Frock, MD;  Location: [facility];  Service: Urology;  Laterality: Right;  . LIVER BIOPSY    . OTHER SURGICAL HISTORY Right    removal of ovarian cyst  . removal of uterine cyst     years ago  . RIGHT VATS W/ DRAINAGE PEURAL EFFUSION AND BX'S  10-30-2008    Current Outpatient Prescriptions  Medication Sig Dispense Refill  . acetaminophen (TYLENOL) 325 MG tablet Take 2 tablets (650 mg total) by mouth every 6 (six) hours as needed for mild pain (or Fever >/= 101). 30 tablet 0  . acyclovir (ZOVIRAX) 800 MG tablet Take 1 tablet (800 mg total) by mouth 2 (two) times daily. 60 tablet 2  . dexamethasone (DECADRON) 4 MG tablet Take 10 tablets (40 mg total) by mouth once a week. 40 tablet 2  . metFORMIN (GLUCOPHAGE) 500 MG tablet Take 1 tablet (500 mg total) by mouth 2 (two) times daily with a meal. 60 tablet 2  . omeprazole (PRILOSEC) 20 MG capsule Take 1 capsule (20 mg total) by mouth daily. 30 capsule 3  . ondansetron (ZOFRAN) 8 MG tablet Take 1 tablet (8 mg total) by mouth every 8 (eight) hours as needed for nausea or vomiting. 30 tablet 1  . polyethylene glycol (MIRALAX / GLYCOLAX) packet Take 17 g by mouth daily as needed.    . promethazine (PHENERGAN)  25 MG tablet Take 1 tablet (25 mg total) by mouth every 6 (six) hours as needed for nausea or vomiting. 30 tablet 0   No current facility-administered medications for this visit.    Facility-Administered Medications Ordered in Other Visits  Medication Dose Route Frequency Provider Last Rate Last Dose  . sodium chloride 0.9 % injection 10 mL  10 mL Intravenous PRN Truitt Merle, MD   10 mL at 12/11/15 1532  . sodium chloride 0.9 % injection 10 mL  10 mL Intravenous PRN Truitt Merle, MD   10 mL at 06/11/16 1505  . sodium chloride flush (NS) 0.9 % injection 10 mL  10 mL Intravenous PRN  Truitt Merle, MD   10 mL at 10/09/15 1717    Allergies as of 10/17/2016  . (No Known Allergies)    Family History  Problem Relation Age of Onset  . Stomach cancer Mother   . Lung disease Father   . Asthma Father     Social History   Social History  . Marital status: Single    Spouse name: N/A  . Number of children: 3  . Years of education: N/A   Occupational History  . Not on file.   Social History Main Topics  . Smoking status: Never Smoker  . Smokeless tobacco: Never Used  . Alcohol use No  . Drug use: No  . Sexual activity: Not Currently    Birth control/ protection: Abstinence   Other Topics Concern  . Not on file   Social History Narrative  . No narrative on file     Physical Exam: BP (!) 86/60 (BP Location: Left Arm, Patient Position: Sitting, Cuff Size: Normal)   Pulse 88   Ht 5\' 2"  (1.575 m)   Wt 128 lb 8 oz (58.3 kg)   BMI 23.50 kg/m  Constitutional: generally well-appearing Psychiatric: alert and oriented x3 Abdomen: soft, nontender, nondistended, no obvious ascites, no peritoneal signs, normal bowel sounds No peripheral edema noted in lower extremities  Assessment and plan: 46 y.o. female with Minor rectal bleeding  We will again work towards colonoscopy for her for her minor rectal bleeding. I see no reason for any further blood tests or imaging studies prior to then.  Please see the "Patient Instructions" section for addition details about the plan.  Owens Loffler, MD [facility] 10/17/2016, 9:57 AM

## 2016-10-20 ENCOUNTER — Ambulatory Visit (HOSPITAL_BASED_OUTPATIENT_CLINIC_OR_DEPARTMENT_OTHER): Payer: Medicaid Other

## 2016-10-20 ENCOUNTER — Telehealth: Payer: Self-pay | Admitting: *Deleted

## 2016-10-20 VITALS — BP 98/64 | HR 94 | Temp 98.4°F | Resp 18

## 2016-10-20 DIAGNOSIS — Z5112 Encounter for antineoplastic immunotherapy: Secondary | ICD-10-CM

## 2016-10-20 DIAGNOSIS — C9012 Plasma cell leukemia in relapse: Secondary | ICD-10-CM | POA: Diagnosis not present

## 2016-10-20 DIAGNOSIS — C9010 Plasma cell leukemia not having achieved remission: Secondary | ICD-10-CM

## 2016-10-20 MED ORDER — BORTEZOMIB CHEMO SQ INJECTION 3.5 MG (2.5MG/ML)
1.5000 mg/m2 | Freq: Once | INTRAMUSCULAR | Status: AC
  Administered 2016-10-20: 2.5 mg via SUBCUTANEOUS
  Filled 2016-10-20: qty 2.5

## 2016-10-20 NOTE — Telephone Encounter (Signed)
FYI - Pt wanted to let Dr Burr Medico know that she has been  up to pee at least 10x at night.  Her next appt is on Mon and will address the problem then.

## 2016-10-20 NOTE — Progress Notes (Signed)
Dr. Burr Medico aware of BP 98/64. Okay to tx. Encouraged pt to increase fluid and nutritional intake.

## 2016-10-20 NOTE — Patient Instructions (Signed)
[facility] Discharge Instructions for Patients Receiving Chemotherapy  Today you received the following chemotherapy agents: bortezomib (Velcade).  To help prevent nausea and vomiting after your treatment, we encourage you to take your nausea medication as directed  If you develop nausea and vomiting that is not controlled by your nausea medication, call the clinic.   BELOW ARE SYMPTOMS THAT SHOULD BE REPORTED IMMEDIATELY:  *FEVER GREATER THAN 100.5 F  *CHILLS WITH OR WITHOUT FEVER  NAUSEA AND VOMITING THAT IS NOT CONTROLLED WITH YOUR NAUSEA MEDICATION  *UNUSUAL SHORTNESS OF BREATH  *UNUSUAL BRUISING OR BLEEDING  TENDERNESS IN MOUTH AND THROAT WITH OR WITHOUT PRESENCE OF ULCERS  *URINARY PROBLEMS  *BOWEL PROBLEMS  UNUSUAL RASH Items with * indicate a potential emergency and should be followed up as soon as possible.  Feel free to call the clinic you have any questions or concerns. The clinic phone number is [phone].  Please show the [place] at check-in to the Emergency Department and triage nurse.

## 2016-10-27 ENCOUNTER — Encounter: Payer: Self-pay | Admitting: Hematology

## 2016-10-27 ENCOUNTER — Ambulatory Visit: Payer: Medicaid Other

## 2016-10-27 ENCOUNTER — Telehealth: Payer: Self-pay | Admitting: Hematology

## 2016-10-27 ENCOUNTER — Other Ambulatory Visit (HOSPITAL_BASED_OUTPATIENT_CLINIC_OR_DEPARTMENT_OTHER): Payer: Medicaid Other

## 2016-10-27 ENCOUNTER — Ambulatory Visit (HOSPITAL_BASED_OUTPATIENT_CLINIC_OR_DEPARTMENT_OTHER): Payer: Medicaid Other | Admitting: Hematology

## 2016-10-27 ENCOUNTER — Ambulatory Visit (HOSPITAL_BASED_OUTPATIENT_CLINIC_OR_DEPARTMENT_OTHER): Payer: Medicaid Other

## 2016-10-27 VITALS — BP 102/64 | HR 78 | Temp 98.2°F | Resp 20 | Ht 62.0 in | Wt 129.5 lb

## 2016-10-27 DIAGNOSIS — E099 Drug or chemical induced diabetes mellitus without complications: Secondary | ICD-10-CM

## 2016-10-27 DIAGNOSIS — K5900 Constipation, unspecified: Secondary | ICD-10-CM

## 2016-10-27 DIAGNOSIS — B181 Chronic viral hepatitis B without delta-agent: Secondary | ICD-10-CM | POA: Diagnosis not present

## 2016-10-27 DIAGNOSIS — C9012 Plasma cell leukemia in relapse: Secondary | ICD-10-CM

## 2016-10-27 DIAGNOSIS — K219 Gastro-esophageal reflux disease without esophagitis: Secondary | ICD-10-CM

## 2016-10-27 DIAGNOSIS — C9010 Plasma cell leukemia not having achieved remission: Secondary | ICD-10-CM

## 2016-10-27 DIAGNOSIS — C9011 Plasma cell leukemia in remission: Secondary | ICD-10-CM

## 2016-10-27 DIAGNOSIS — Z95828 Presence of other vascular implants and grafts: Secondary | ICD-10-CM

## 2016-10-27 DIAGNOSIS — Z23 Encounter for immunization: Secondary | ICD-10-CM

## 2016-10-27 DIAGNOSIS — Z5112 Encounter for antineoplastic immunotherapy: Secondary | ICD-10-CM

## 2016-10-27 DIAGNOSIS — D649 Anemia, unspecified: Secondary | ICD-10-CM

## 2016-10-27 DIAGNOSIS — T380X5A Adverse effect of glucocorticoids and synthetic analogues, initial encounter: Secondary | ICD-10-CM

## 2016-10-27 LAB — CBC WITH DIFFERENTIAL/PLATELET
BASO%: 0 % (ref 0.0–2.0)
BASOS ABS: 0 10*3/uL (ref 0.0–0.1)
EOS ABS: 0 10*3/uL (ref 0.0–0.5)
EOS%: 0.2 % (ref 0.0–7.0)
HCT: 32.1 % — ABNORMAL LOW (ref 34.8–46.6)
HGB: 9.9 g/dL — ABNORMAL LOW (ref 11.6–15.9)
LYMPH%: 11.6 % — AB (ref 14.0–49.7)
MCH: 26.6 pg (ref 25.1–34.0)
MCHC: 30.8 g/dL — ABNORMAL LOW (ref 31.5–36.0)
MCV: 86.3 fL (ref 79.5–101.0)
MONO#: 0.2 10*3/uL (ref 0.1–0.9)
MONO%: 4.4 % (ref 0.0–14.0)
NEUT#: 4.5 10*3/uL (ref 1.5–6.5)
NEUT%: 83.8 % — ABNORMAL HIGH (ref 38.4–76.8)
NRBC: 0 % (ref 0–0)
PLATELETS: 183 10*3/uL (ref 145–400)
RBC: 3.72 10*6/uL (ref 3.70–5.45)
RDW: 16.3 % — ABNORMAL HIGH (ref 11.2–14.5)
WBC: 5.4 10*3/uL (ref 3.9–10.3)
lymph#: 0.6 10*3/uL — ABNORMAL LOW (ref 0.9–3.3)

## 2016-10-27 LAB — COMPREHENSIVE METABOLIC PANEL
ALK PHOS: 92 U/L (ref 40–150)
ALT: <6 U/L (ref 0–55)
ANION GAP: 7 meq/L (ref 3–11)
AST: 21 U/L (ref 5–34)
Albumin: 3.3 g/dL — ABNORMAL LOW (ref 3.5–5.0)
BILIRUBIN TOTAL: 0.31 mg/dL (ref 0.20–1.20)
BUN: 14.1 mg/dL (ref 7.0–26.0)
CO2: 25 meq/L (ref 22–29)
Calcium: 8.9 mg/dL (ref 8.4–10.4)
Chloride: 108 mEq/L (ref 98–109)
Creatinine: 0.7 mg/dL (ref 0.6–1.1)
GLUCOSE: 100 mg/dL (ref 70–140)
POTASSIUM: 4 meq/L (ref 3.5–5.1)
SODIUM: 141 meq/L (ref 136–145)
TOTAL PROTEIN: 6.4 g/dL (ref 6.4–8.3)

## 2016-10-27 MED ORDER — BORTEZOMIB CHEMO SQ INJECTION 3.5 MG (2.5MG/ML)
1.5000 mg/m2 | Freq: Once | INTRAMUSCULAR | Status: AC
  Administered 2016-10-27: 2.5 mg via SUBCUTANEOUS
  Filled 2016-10-27: qty 2.5

## 2016-10-27 MED ORDER — SODIUM CHLORIDE 0.9 % IJ SOLN
10.0000 mL | INTRAMUSCULAR | Status: DC | PRN
  Administered 2016-10-27: 10 mL via INTRAVENOUS
  Filled 2016-10-27: qty 10

## 2016-10-27 MED ORDER — SODIUM CHLORIDE 0.9% FLUSH
10.0000 mL | INTRAVENOUS | Status: DC | PRN
  Administered 2016-10-27: 10 mL
  Filled 2016-10-27: qty 10

## 2016-10-27 MED ORDER — HEPARIN SOD (PORK) LOCK FLUSH 100 UNIT/ML IV SOLN
500.0000 [IU] | Freq: Once | INTRAVENOUS | Status: AC | PRN
Start: 2016-10-27 — End: 2016-10-27
  Administered 2016-10-27: 500 [IU]
  Filled 2016-10-27: qty 5

## 2016-10-27 MED ORDER — INFLUENZA VAC SPLIT QUAD 0.5 ML IM SUSY
0.5000 mL | PREFILLED_SYRINGE | Freq: Once | INTRAMUSCULAR | Status: AC
  Administered 2016-10-27: 0.5 mL via INTRAMUSCULAR
  Filled 2016-10-27: qty 0.5

## 2016-10-27 NOTE — Patient Instructions (Signed)
[facility] Discharge Instructions for Patients Receiving Chemotherapy  Today you received the following chemotherapy agents: bortezomib (Velcade).  To help prevent nausea and vomiting after your treatment, we encourage you to take your nausea medication as directed  If you develop nausea and vomiting that is not controlled by your nausea medication, call the clinic.   BELOW ARE SYMPTOMS THAT SHOULD BE REPORTED IMMEDIATELY:  *FEVER GREATER THAN 100.5 F  *CHILLS WITH OR WITHOUT FEVER  NAUSEA AND VOMITING THAT IS NOT CONTROLLED WITH YOUR NAUSEA MEDICATION  *UNUSUAL SHORTNESS OF BREATH  *UNUSUAL BRUISING OR BLEEDING  TENDERNESS IN MOUTH AND THROAT WITH OR WITHOUT PRESENCE OF ULCERS  *URINARY PROBLEMS  *BOWEL PROBLEMS  UNUSUAL RASH Items with * indicate a potential emergency and should be followed up as soon as possible.  Feel free to call the clinic you have any questions or concerns. The clinic phone number is [phone].  Please show the [place] at check-in to the Emergency Department and triage nurse.

## 2016-10-27 NOTE — Patient Instructions (Signed)
Implanted Port Home Guide An implanted port is a type of central line that is placed under the skin. Central lines are used to provide IV access when treatment or nutrition needs to be given through a person's veins. Implanted ports are used for long-term IV access. An implanted port may be placed because:  You need IV medicine that would be irritating to the small veins in your hands or arms.  You need long-term IV medicines, such as antibiotics.  You need IV nutrition for a long period.  You need frequent blood draws for lab tests.  You need dialysis.  Implanted ports are usually placed in the chest area, but they can also be placed in the upper arm, the abdomen, or the leg. An implanted port has two main parts:  Reservoir. The reservoir is round and will appear as a small, raised area under your skin. The reservoir is the part where a needle is inserted to give medicines or draw blood.  Catheter. The catheter is a thin, flexible tube that extends from the reservoir. The catheter is placed into a large vein. Medicine that is inserted into the reservoir goes into the catheter and then into the vein.  How will I care for my incision site? Do not get the incision site wet. Bathe or shower as directed by your health care provider. How is my port accessed? Special steps must be taken to access the port:  Before the port is accessed, a numbing cream can be placed on the skin. This helps numb the skin over the port site.  Your health care provider uses a sterile technique to access the port. ? Your health care provider must put on a mask and sterile gloves. ? The skin over your port is cleaned carefully with an antiseptic and allowed to dry. ? The port is gently pinched between sterile gloves, and a needle is inserted into the port.  Only "non-coring" port needles should be used to access the port. Once the port is accessed, a blood return should be checked. This helps ensure that the port  is in the vein and is not clogged.  If your port needs to remain accessed for a constant infusion, a clear (transparent) bandage will be placed over the needle site. The bandage and needle will need to be changed every week, or as directed by your health care provider.  Keep the bandage covering the needle clean and dry. Do not get it wet. Follow your health care provider's instructions on how to take a shower or bath while the port is accessed.  If your port does not need to stay accessed, no bandage is needed over the port.  What is flushing? Flushing helps keep the port from getting clogged. Follow your health care provider's instructions on how and when to flush the port. Ports are usually flushed with saline solution or a medicine called heparin. The need for flushing will depend on how the port is used.  If the port is used for intermittent medicines or blood draws, the port will need to be flushed: ? After medicines have been given. ? After blood has been drawn. ? As part of routine maintenance.  If a constant infusion is running, the port may not need to be flushed.  How long will my port stay implanted? The port can stay in for as long as your health care provider thinks it is needed. When it is time for the port to come out, surgery will be   done to remove it. The procedure is similar to the one performed when the port was put in. When should I seek immediate medical care? When you have an implanted port, you should seek immediate medical care if:  You notice a bad smell coming from the incision site.  You have swelling, redness, or drainage at the incision site.  You have more swelling or pain at the port site or the surrounding area.  You have a fever that is not controlled with medicine.  This information is not intended to replace advice given to you by your health care provider. Make sure you discuss any questions you have with your health care provider. Document  Released: 01/20/2005 Document Revised: 06/28/2015 Document Reviewed: 09/27/2012 Elsevier Interactive Patient Education  2017 [employer]  

## 2016-10-27 NOTE — Progress Notes (Signed)
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      10/10/2014 Miscellaneous    Peripheral blood chemistry and leukocytosis with total white count 78K, comprised of large plasma cells and his normocytic anemia. There is a myeloid left shift with previous surgical radium blasts. Flow cytometry showed 64% plasma cells      10/10/2014 Bone Marrow Biopsy    Markedly hypercellular marrow (95%), Atypical plasma cells comprise 57% of the cellularity. There was diminished multilineage in hematopoiesis with adequate maturation. Breasts less than 1%), no overt dysplasia of the myeloid or erythroid lineages.       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      05/07/2015 - 05/12/2015 Hospital Admission    [facility], [facility]. ID workup was negative, EGD showed evidence of gastritis and duodenitis, no H. pylori or  CMV.      05/20/2015 - 05/24/2015 Hospital Admission    [facility]      07/11/2015 Bone Marrow Biopsy    Post transplant 100 a bone marrow biopsy showed hypocellular marrow, 20%, no increase in plasma cells (2%) or other abnormalities.      08/22/2015 - 01/02/2016 Chemotherapy    MaintenanceCyborD (cytoxan 3036mm2 iv, bortezomib 1.5 mg/m, dexamethasone 40 mg, every 2 weeks, changed to Velcade maintenance after 4 months treatment      01/16/2016 - 04/19/2016 Chemotherapy    Maintenance Velcade 1.3 mg/m every 2 weeks      02/22/2016 Pathology Results    BONE MARROW: Diagnosis Bone Marrow, Aspirate,Biopsy, and Clot, right iliac - NORMOCELLULAR BONE MARROW FOR AGE WITH TRILINEAGE HEMATOPOIESIS. - PLASMACYTOSIS (PLASMA CELLS 12%). - SEE COMMENT. PERIPHERAL BLOOD: - OCCASIONAL CIRCULATING PLASMA CELLS.      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 female is here because of recently diagnosed plasma cell leukemia. [facility] and is here to establish her local oncological care [place] is accompanied  to the clinic by her daughter and interpreter.  She presented to our hospital lth with worsening dyspnea, fatigue, cough, subjective fevers and chills, and was admitted on 09/14/2014. She was found to have allergy WBC 54.1K, hemoglobin 8.1, plt 310, she was treated with symptom management, and was discharged home on 09/17/2014, with a plan to follow-up with hematology. She represents to  emergency room on 10/07/2014, was found to have white count of 78K, and worsening anemia with hemoglobin 6. She was seen by my partner Dr. Jonette Eva and plasma cell leukemia was suspected. She was transferred to [facility] for further leukemia work-up and treatment. Bone marrow biopsy was done, which confirmed plasma cell leukemia, and she started chemotherapy with Velcade, Cytoxan and dexamethasone. She received weekly dose twice, last dose on 10/20/2014. She was discharged to home afterwards. She had a mediport placed during her hospitalization.  She has moderate fatigue, low appetie, she lost about 13 lbs in the past few months. She has moderate abdominal pain, 5-6/10, persistent, she does not take any pain meds.   I reviewed her medical records extensively, and discussed her case with Dr. Jorje Guild and [employer] program coordinator.  CURRENT THERAPY:   1. Velcade 1.71m/m2 and dexa 441mevery weeks 2. Daratumumab weekly started on 04/18/2016, changed to every 2 weeks on 06/25/2016 and changed to every 4 weeks after 10/13/2016, per protocol  3. zometa every 4 weeks started on 11/27/2015   INTERIM HISTORY:   Sheryl [name] is here for a follow up with her interpreter. Overall things are going alright. Her energy levels and appetite have been good recently and are improved. She notes that her upper lids feel heavy, but she denies any weakness or visual disturbances. She denies any recent fever, fatigue, loss of appetite. She has been walking and exercising well recently. She denies any issues following her infusions. She notes that her daughter [facility] and they informed her to call them back to schedule up in October.   On review of systems, pt denies sore throat, cough, urinary sx, weight loss, decreased appetite, neuropathy. Denies pain. Pt denies abdominal pain, nausea, vomiting. Endorsees some eye heaviness.    MEDICAL HISTORY:  Past Medical History:   Diagnosis Date  . Chills with fever    intermittently since d/c from hospital  . Dysuria-frequency syndrome    w/ pink urine  . GERD (gastroesophageal reflux disease)   . Hepatitis   . History of positive PPD    DX 2011--  CXR DONE NO EVIDENCE  . History of ureter stent   . Hydronephrosis, right   . Neuromuscular disorder (H[facility])    legs numb intermittently  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  . Pneumonia   . Right ureteral stone   . Urosepsis 8/14   ad[facility]    SURGICAL HISTORY: Past Surgical History:  Procedure Laterality Date  . CYSTOSCOPY W/ URETERAL STENT PLACEMENT Right 09/25/2012   Procedure: CYSTOSCOPY WITH RETROGRADE PYELOGRAM/URETERAL STENT PLACEMENT;  Surgeon: ThAlexis FrockMD;  Location: [facility];  Service: Urology;  Laterality: Right;  . CYSTOSCOPY WITH RETROGRADE PYELOGRAM, URETEROSCOPY AND STENT PLACEMENT Right 10/15/2012   Procedure: CYSTOSCOPY WITH RETROGRADE PYELOGRAM, URETEROSCOPY AND REMOVAL STENT WITH  STENT PLACEMENT;  Surgeon: ThAlexis FrockMD;  [facility];  Laterality: Right;  . ESOPHAGOGASTRODUODENOSCOPY (EGD) WITH PROPOFOL N/A 11/16/2014   Procedure: ESOPHAGOGASTRODUODENOSCOPY (EGD) WITH PROPOFOL;  Surgeon: DaMilus BanisterMD;  Location: [facility];  Service: Endoscopy;  Laterality: N/A;  . HOLMIUM LASER APPLICATION Right 2/42/3536   Procedure: HOLMIUM LASER APPLICATION;  Surgeon: Alexis Frock, MD;  Location: [facility];  Service: Urology;  Laterality: Right;  . LIVER BIOPSY    . OTHER SURGICAL HISTORY Right    removal of ovarian cyst  . removal of uterine cyst     years ago  . RIGHT VATS W/ DRAINAGE PEURAL EFFUSION AND BX'S  10-30-2008    SOCIAL HISTORY: Social History   Social History  . Marital status: Single    Spouse name: N/A  . Number of children: 3  . Years of education: N/A   Social History Main Topics  . Smoking status: Never Smoker  . Smokeless tobacco: Never  Used  . Alcohol use No  . Drug use: No  . Sexual activity: Not Currently    Birth control/ protection: Abstinence   Other Topics Concern  . None   Social History Narrative  . None    FAMILY HISTORY: Family History  Problem Relation Age of Onset  . Stomach cancer Mother   . Lung disease Father   . Asthma Father     ALLERGIES:  has No Known Allergies.  MEDICATIONS:  Current Outpatient Prescriptions  Medication Sig Dispense Refill  . acetaminophen (TYLENOL) 325 MG tablet Take 2 tablets (650 mg total) by mouth every 6 (six) hours as needed for mild pain (or Fever >/= 101). 30 tablet 0  . acyclovir (ZOVIRAX) 800 MG tablet Take 1 tablet (800 mg total) by mouth 2 (two) times daily. 60 tablet 2  . dexamethasone (DECADRON) 4 MG tablet Take 10 tablets (40 mg total) by mouth once a week. 40 tablet 2  . metFORMIN (GLUCOPHAGE) 500 MG tablet Take 1 tablet (500 mg total) by mouth 2 (two) times daily with a meal. 60 tablet 2  . omeprazole (PRILOSEC) 20 MG capsule Take 1 capsule (20 mg total) by mouth daily. 30 capsule 3  . ondansetron (ZOFRAN) 8 MG tablet Take 1 tablet (8 mg total) by mouth every 8 (eight) hours as needed for nausea or vomiting. 30 tablet 1  . polyethylene glycol (MIRALAX / GLYCOLAX) packet Take 17 g by mouth daily as needed.    . promethazine (PHENERGAN) 25 MG tablet Take 1 tablet (25 mg total) by mouth every 6 (six) hours as needed for nausea or vomiting. 30 tablet 0   No current facility-administered medications for this visit.    Facility-Administered Medications Ordered in Other Visits  Medication Dose Route Frequency Provider Last Rate Last Dose  . bortezomib SQ (VELCADE) chemo injection 2.5 mg  1.5 mg/m2 (Treatment Plan Recorded) Subcutaneous Once Truitt Merle, MD      . sodium chloride 0.9 % injection 10 mL  10 mL Intravenous PRN Truitt Merle, MD   10 mL at 12/11/15 1532  . sodium chloride 0.9 % injection 10 mL  10 mL Intravenous PRN Truitt Merle, MD   10 mL at 06/11/16 1505   . sodium chloride flush (NS) 0.9 % injection 10 mL  10 mL Intravenous PRN Truitt Merle, MD   10 mL at 10/09/15 1717  . sodium chloride flush (NS) 0.9 % injection 10 mL  10 mL Intracatheter PRN Truitt Merle, MD   10 mL at 10/27/16 1051    REVIEW OF SYSTEMS:  Constitutional: no abnormal night sweats  Eyes: Denies blurriness of vision, double vision or watery eyes. (+) eye heaviness  Ears, nose, mouth, throat,  and face: Denies mucositis (-) Sore throat  Respiratory: Denies, dyspnea or wheezes  Cardiovascular: Denies palpitation, chest discomfort or lower extremity swelling Gastrointestinal:  Denies nausea, heartburn, reports regular bowel/bladder Skin: Denies abnormal skin rashes Lymphatics: Denies new lymphadenopathy or easy bruising Neurological:Denies numbness, tingling or new weaknesses Behavioral/Psych: Mood is stable, no new changes  All other systems were reviewed with the patient and are negative.  PHYSICAL EXAMINATION:   ECOG PERFORMANCE STATUS: 1 Vitals:   10/27/16 0954  BP: 102/64  Pulse: 78  Resp: 20  Temp: 98.2 F (36.8 C)  TempSrc: Oral  SpO2: 100%  Weight: 129 lb 8 oz (58.7 kg)  Height: '5\' 2"'  (1.575 m)    GENERAL:alert, no distress and comfortable SKIN: skin color, texture, turgor are normal, no rashes or significant lesions EYES: normal, conjunctiva are pink and non-injected, sclera clear OROPHARYNX:no exudate, no erythema and lips, buccal mucosa  NECK: supple, thyroid normal size, non-tender, without nodularity LYMPH:  no palpable lymphadenopathy in the cervical, axillary or inguinal LUNGS: clear to auscultation and percussion with normal breathing effort HEART: regular rate & rhythm and no murmurs and no lower extremity edema ABDOMEN:abdomen soft, mildly tender, no hepatomegaly  Musculoskeletal:no cyanosis of digits and no clubbing  PSYCH: alert & oriented x 3 with fluent speech NEURO: no focal motor/sensory deficits  LABORATORY DATA:  I have reviewed the data  as listed CBC Latest Ref Rng & Units 10/27/2016 10/13/2016 10/07/2016  WBC 3.9 - 10.3 10e3/uL 5.4 6.9 4.6  Hemoglobin 11.6 - 15.9 g/dL 9.9(L) 9.8(L) 9.4(L)  Hematocrit 34.8 - 46.6 % 32.1(L) 32.1(L) 30.8(L)  Platelets 145 - 400 10e3/uL 183 192 206    CMP Latest Ref Rng & Units 10/27/2016 10/13/2016 10/07/2016  Glucose 70 - 140 mg/dl 100 105 179(H)  BUN 7.0 - 26.0 mg/dL 14.1 9.6 8.6  Creatinine 0.6 - 1.1 mg/dL 0.7 0.7 0.7  Sodium 136 - 145 mEq/L 141 141 140  Potassium 3.5 - 5.1 mEq/L 4.0 4.1 3.8  Chloride 101 - 111 mmol/L - - -  CO2 22 - 29 mEq/L '25 23 24  ' Calcium 8.4 - 10.4 mg/dL 8.9 9.1 9.1  Total Protein 6.4 - 8.3 g/dL 6.4 6.5 6.6  Total Bilirubin 0.20 - 1.20 mg/dL 0.31 0.32 <0.22  Alkaline Phos 40 - 150 U/L 92 129 168(H)  AST 5 - 34 U/L '21 27 23  ' ALT 0-55 U/L U/L <'6 12 13    ' SPEP M-protein  09/15/2014: 4.2 10/08/14: 4.6 12/08/2014: 0.2 02/15/2015: not sufficient sample for test   09/11/2015: not observed 10/09/2015: not det   11/06/2015: not det  12/25/2015: not det  02/13/2016: 0.3 03/12/2016: 0.8 04/09/2016: 0.6 05/08/16: 0.1 06/04/16: 0.1 07/02/2016: 0.2 07/09/2016: 0.2 (dara specific IFE negative) 08/05/16: Not observed  09/01/16: Not observed 09/29/16: 0.1(dara specific IFE negative)   IgG mg/dl 11/03/2014: 3150  12/08/2014:  759 02/14/2014: 860 09/11/2015: 1347 10/09/2015: 1457 11/06/2015: 1504 12/25/2015: 1400 02/13/2016: 1543 03/12/2016: 1860 04/09/2016: 1620 05/08/16: 749 06/04/16: 751 5/30/20187: 796 07/09/2016: 701 08/05/16: 678 09/01/16: 650 09/29/16: 727   Kappa/lambda light chains levels and ration  11/03/14: 0.75, 152, 0.00 12/22/2014: 1.10, 2.60, 0.42 02/15/2015: 9.59,14.35, 0.67 09/11/2015: 2.44, 2.72, 0.90 10/09/2015: 3.09, 3.49, 0.89 11/06/2015: 2.30, 2.62, 0.88 12/25/2015: 2.51, 3.0, 0.84 02/13/2016: 2.64, 15.3, 0.17 03/12/2016: 2.27, 45.5, 0.05 04/09/2016: 3.33, 45.5, 0.07 05/08/2016: 0.49, 1.21, 0.40 06/04/2016: 0.83, 1.11, 0.75 07/02/16: 0.51, 1.43, 0.36 07/23/16: 0.68, 1.02,  0.67 08/05/16: 0.62, 10. 9, 0.57 09/01/16: 8.0, 12.1, 0.66 09/29/16: 4.4, 10.7, 0.41  24 h urine UPEP/IFE and light chain: 11/03/2014: IFE showed a monoclonal IgG heavy chain with associated lambda light chain. M protein was Undetectable   PATHOLOGY REPORT  Bone Marrow (BM) and Peripheral Blood (PB) FINAL PATHOLOGIC DIAGNOSIS  BONE MARROW: 07/11/2015 Hypocellular marrow (20%) with no increase in plasma cells (2%). See comment.  PERIPHERAL BLOOD: Mild anemia. No circulating plasma cells identified. See comment and CBC data.  BONE MARROW: 02/22/2016 Diagnosis Bone Marrow, Aspirate,Biopsy, and Clot, right iliac - NORMOCELLULAR BONE MARROW FOR AGE WITH TRILINEAGE HEMATOPOIESIS. - PLASMACYTOSIS (PLASMA CELLS 12%). - SEE COMMENT. PERIPHERAL BLOOD: - OCCASIONAL CIRCULATING PLASMA CELLS.  RADIOGRAPHIC STUDIES: I have personally reviewed the radiological images as listed and agreed with the findings in the report. No results found.   CXR 05/08/2016 IMPRESSION:  No acute disease.  CT Biopsy 02/22/16 IMPRESSION: 1. Technically successful CT guided right iliac bone core and aspiration biopsy.  ASSESSMENT & PLAN: 46 y.o. [place] woman, presented with anemia and leokocytosis   1. 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 Unfortunately this showed increased plasma cells 12% with additional lambda light chain staining pending. This is relapse of disease. Cytogenetics was normal  -Her M protein and lambda free light chain level has significantly increased lately, consistent with disease relapse -Her recent LP showed negative CSF, no evidence of CNS involvement. -I have previously spoken with  Dr. Norma Fredrickson, we agreed to change her CyBorD to weekly, and add daratumumab weekly, starting today. Potential side effects would discussed with patient, especially cytopenia and infusion reaction, she will take dexamethasone the day before Dara infusion.  -If her disease does not respond to treatment quickly, or if she has intolerance issue to cyBorD, I will change to pomalidomide,  dexamethasone and Dara  -continue acyclovir  -Continue Zometa every 4 weeks for a total of 2 years (until 11/2017) - Due to her hospitalization for severe cytopenia and infection, Cytoxan has been held. -she has restarted weekly Velcade and dexa, and now on Dara every 2 weeks, tolerating well, will continue. Her recent dara-specific IFE was negative for M-protein on 07/07/16 at [facility], she has achieved second CR.  - continue Velcade injection and dexa 63m weekly, and Dara infusion every 2 weeks, will change to every 4 weeks as maintenance therapy after next infusion in 2 weeks  - she is responding to treatment well, SPEP M protein has been undetectable-0.2 with normal IFE (dara specific) since early July 2018, she has achieved CR again  -She continues to take dexamethasone 466mweekly on the day before getting Velcade injection, which is weekly  -per Dara protocol,, we will change to every 4 weeks since her last treatment on 10/13/2016 -I have discussed with Dr, Dr. [place]a [place]. She missed her appointment with him on 9/5, will reschedule to a few weeks  -Today she is clinically doing well, exam was unremarkable, lab results reviewed with her. She does have slight anemia, but adequate to continue treatment and there is not a need for blood transfusion at this time.   2. Type 2 diabetes, steroids induced  -She was noticed to have increased blood glucose level lately, with random blood glucose above 200. She also developed diabetic symptoms. No history of diabetes in  the past. -This is likely steroids  induced hyperglycemia -I strongly recommend her to avoid any soft drink and sweats, and watch her carbohydrate intake, and exercise more  -She has been seen by her primary care physician. She was not given any medication to manage her glucose. I will call her primary care physician to discuss adding metformin. -I previously instructed her to take metformin every day since she is now taking dexamethasone more frequently.  -She restarted her dexamethasone weekly as part of his leukemia treatment, and she will also receive steroids as premedication, I recommend her to take metformin 500 mg twice daily continuously, to control her hyperglycemia.Potential side effects discussed, she agreed to proceed. -Continue close monitoring. -We previously discussed restarting to metformin one pill a day while on steroids to see if her levels will stabilize. I previously encouraged her to take it in the morning with breakfast.  -Due to increase in steroids, Pt will increase metformin to twice daily. I again discussed with her, she voiced understanding and will change from daily to twice daily.   3. Chronic Hepatitis B  - She will follow-up with the GI clinic at [facility] -continue entecavir, but she run out 2 weeks ago and has not refilled due to high copay.  Our nursing staff previously called her GI physician's office to request this for her.  - I previously encouraged her to call Dr at [facility] to get help with medication prices -She has not taken medication in 2-3 months.  -She is able to afford her $250 copay, but still not able to get in touch with GI clinic. My nurse has contacted [place] [facility] at least twice to request refill for her but it has not been refilled.  - Pt has not gotten her medication refilled. I previously referred her to ID in [place]. She reports that they did not call her and she has not been scheduled for this. I will re-refer her for this.   4. GERD, recent GI bleeding and nausea   -Continue omeprazole daily. We previously discussed steroids can worsen her acid reflux. -She previously had an EGD at [facility] in 2017. This was benign.  -- she did not do her colonoscopy because she did not have anyone to accompany her during visit. She has recently seen Dr. Ardis Hughs again, and is scheduled for colonoscopy next month.  5. Constipation -I previously encouraged the patient to try senca-s, ducalax 1-3 times a day or milk of magnesium if miralax doresn't help with constipation  -I previously advised the patient to take something everyday to help with constipation. .   Plan  -ID referral to be seen in 1-2 weeks for her Hep B treatment  -Lab, flush, and f/u and dara infusion on 10/8 and 11/5.  -Velcade infections weekly x4 -Lab, flush and f/u in 4 weeks -Flu shot today  All questions were answered. The patient knows to call the clinic with any problems, questions or concerns.  I spent 20 minutes counseling the patient face to face. We used the vedio interpreter service. The total time spent in the appointment was 20 minutes and more than 50% was on counseling.   This document serves as a record of services personally performed by Truitt Merle, MD. It was created on her behalf by Reola Mosher, a trained medical scribe. The creation of this record is based on the scribe's personal observations and the provider's statements to them. This document has been checked and approved by the attending  provider.   Truitt Merle, MD  10/27/2016

## 2016-10-27 NOTE — Telephone Encounter (Signed)
Scheduled appt per 9/24 los - Gave patient AVS and calender per los.  

## 2016-10-28 LAB — PROTEIN ELECTROPHORESIS, SERUM
A/G Ratio: 1 (ref 0.7–1.7)
Albumin: 3 g/dL (ref 2.9–4.4)
Alpha 1: 0.2 g/dL (ref 0.0–0.4)
Alpha 2: 1 g/dL (ref 0.4–1.0)
Beta: 1 g/dL (ref 0.7–1.3)
Gamma Globulin: 0.7 g/dL (ref 0.4–1.8)
Globulin, Total: 3 g/dL (ref 2.2–3.9)
Total Protein: 6 g/dL (ref 6.0–8.5)

## 2016-10-28 LAB — KAPPA/LAMBDA LIGHT CHAINS
IG LAMBDA FREE LIGHT CHAIN: 14.4 mg/L (ref 5.7–26.3)
Ig Kappa Free Light Chain: 6.3 mg/L (ref 3.3–19.4)
KAPPA/LAMBDA FLC RATIO: 0.44 (ref 0.26–1.65)

## 2016-10-30 LAB — IFE, DARA-SPECIFIC, SERUM
IGM (IMMUNOGLOBIN M), SRM: 21 mg/dL — AB (ref 26–217)
IgA, Qn, Serum: 25 mg/dL — ABNORMAL LOW (ref 87–352)
IgG, Qn, Serum: 634 mg/dL — ABNORMAL LOW (ref 700–1600)

## 2016-11-03 ENCOUNTER — Ambulatory Visit (HOSPITAL_BASED_OUTPATIENT_CLINIC_OR_DEPARTMENT_OTHER): Payer: Medicaid Other

## 2016-11-03 ENCOUNTER — Ambulatory Visit: Payer: Medicaid Other

## 2016-11-03 VITALS — BP 110/70 | HR 86 | Temp 98.0°F | Resp 18

## 2016-11-03 DIAGNOSIS — C9012 Plasma cell leukemia in relapse: Secondary | ICD-10-CM

## 2016-11-03 DIAGNOSIS — Z95828 Presence of other vascular implants and grafts: Secondary | ICD-10-CM

## 2016-11-03 DIAGNOSIS — Z5112 Encounter for antineoplastic immunotherapy: Secondary | ICD-10-CM | POA: Diagnosis present

## 2016-11-03 DIAGNOSIS — C9010 Plasma cell leukemia not having achieved remission: Secondary | ICD-10-CM

## 2016-11-03 MED ORDER — SODIUM CHLORIDE 0.9% FLUSH
10.0000 mL | INTRAVENOUS | Status: DC | PRN
  Administered 2016-11-03: 10 mL
  Filled 2016-11-03: qty 10

## 2016-11-03 MED ORDER — ZOLEDRONIC ACID 4 MG/100ML IV SOLN
4.0000 mg | Freq: Once | INTRAVENOUS | Status: AC
  Administered 2016-11-03: 4 mg via INTRAVENOUS
  Filled 2016-11-03: qty 100

## 2016-11-03 MED ORDER — HEPARIN SOD (PORK) LOCK FLUSH 100 UNIT/ML IV SOLN
500.0000 [IU] | Freq: Once | INTRAVENOUS | Status: AC | PRN
  Administered 2016-11-03: 500 [IU]
  Filled 2016-11-03: qty 5

## 2016-11-03 MED ORDER — BORTEZOMIB CHEMO SQ INJECTION 3.5 MG (2.5MG/ML)
1.5000 mg/m2 | Freq: Once | INTRAMUSCULAR | Status: AC
  Administered 2016-11-03: 2.5 mg via SUBCUTANEOUS
  Filled 2016-11-03: qty 2.5

## 2016-11-03 MED ORDER — SODIUM CHLORIDE 0.9 % IV SOLN
Freq: Once | INTRAVENOUS | Status: AC
  Administered 2016-11-03: 09:00:00 via INTRAVENOUS

## 2016-11-03 NOTE — Patient Instructions (Signed)
[facility] Discharge Instructions for Patients Receiving Chemotherapy  Today you received the following chemotherapy agents: bortezomib (Velcade).  To help prevent nausea and vomiting after your treatment, we encourage you to take your nausea medication as directed  If you develop nausea and vomiting that is not controlled by your nausea medication, call the clinic.   BELOW ARE SYMPTOMS THAT SHOULD BE REPORTED IMMEDIATELY:  *FEVER GREATER THAN 100.5 F  *CHILLS WITH OR WITHOUT FEVER  NAUSEA AND VOMITING THAT IS NOT CONTROLLED WITH YOUR NAUSEA MEDICATION  *UNUSUAL SHORTNESS OF BREATH  *UNUSUAL BRUISING OR BLEEDING  TENDERNESS IN MOUTH AND THROAT WITH OR WITHOUT PRESENCE OF ULCERS  *URINARY PROBLEMS  *BOWEL PROBLEMS  UNUSUAL RASH Items with * indicate a potential emergency and should be followed up as soon as possible.  Feel free to call the clinic you have any questions or concerns. The clinic phone number is [phone].  Please show the [facility] at check-in to the Emergency Department and triage nurse.  Zoledronic Acid injection (Hypercalcemia, Oncology) What is this medicine? ZOLEDRONIC ACID (ZOE le dron ik AS id) lowers the amount of calcium loss from bone. It is used to treat too much calcium in your blood from cancer. It is also used to prevent complications of cancer that has spread to the bone. This medicine may be used for other purposes; ask your health care provider or pharmacist if you have questions. COMMON BRAND NAME(S): Zometa What should I tell my health care provider before I take this medicine? They need to know if you have any of these conditions: -aspirin-sensitive asthma -cancer, especially if you are receiving medicines used to treat cancer -dental disease or wear dentures -infection -kidney disease -receiving corticosteroids like dexamethasone or prednisone -an unusual or allergic reaction to zoledronic acid, other  medicines, foods, dyes, or preservatives -pregnant or trying to get pregnant -breast-feeding How should I use this medicine? This medicine is for infusion into a vein. It is given by a health care professional in a hospital or clinic setting. Talk to your pediatrician regarding the use of this medicine in children. Special care may be needed. Overdosage: If you think you have taken too much of this medicine contact a poison control center or emergency room at once. NOTE: This medicine is only for you. Do not share this medicine with others. What if I miss a dose? It is important not to miss your dose. Call your doctor or health care professional if you are unable to keep an appointment. What may interact with this medicine? -certain antibiotics given by injection -NSAIDs, medicines for pain and inflammation, like ibuprofen or naproxen -some diuretics like bumetanide, furosemide -teriparatide -thalidomide This list may not describe all possible interactions. Give your health care provider a list of all the medicines, herbs, non-prescription drugs, or dietary supplements you use. Also tell them if you smoke, drink alcohol, or use illegal drugs. Some items may interact with your medicine. What should I watch for while using this medicine? Visit your doctor or health care professional for regular checkups. It may be some time before you see the benefit from this medicine. Do not stop taking your medicine unless your doctor tells you to. Your doctor may order blood tests or other tests to see how you are doing. Women should inform their doctor if they wish to become pregnant or think they might be pregnant. There is a potential for serious side effects to an unborn child. Talk to your  health care professional or pharmacist for more information. You should make sure that you get enough calcium and vitamin D while you are taking this medicine. Discuss the foods you eat and the vitamins you take with  your health care professional. Some people who take this medicine have severe bone, joint, and/or muscle pain. This medicine may also increase your risk for jaw problems or a broken thigh bone. Tell your doctor right away if you have severe pain in your jaw, bones, joints, or muscles. Tell your doctor if you have any pain that does not go away or that gets worse. Tell your dentist and dental surgeon that you are taking this medicine. You should not have major dental surgery while on this medicine. See your dentist to have a dental exam and fix any dental problems before starting this medicine. Take good care of your teeth while on this medicine. Make sure you see your dentist for regular follow-up appointments. What side effects may I notice from receiving this medicine? Side effects that you should report to your doctor or health care professional as soon as possible: -allergic reactions like skin rash, itching or hives, swelling of the face, lips, or tongue -anxiety, confusion, or depression -breathing problems -changes in vision -eye pain -feeling faint or lightheaded, falls -jaw pain, especially after dental work -mouth sores -muscle cramps, stiffness, or weakness -redness, blistering, peeling or loosening of the skin, including inside the mouth -trouble passing urine or change in the amount of urine Side effects that usually do not require medical attention (report to your doctor or health care professional if they continue or are bothersome): -bone, joint, or muscle pain -constipation -diarrhea -fever -hair loss -irritation at site where injected -loss of appetite -nausea, vomiting -stomach upset -trouble sleeping -trouble swallowing -weak or tired This list may not describe all possible side effects. Call your doctor for medical advice about side effects. You may report side effects to FDA at 1-800-FDA-1088. Where should I keep my medicine? This drug is given in a hospital or  clinic and will not be stored at home. NOTE: This sheet is a summary. It may not cover all possible information. If you have questions about this medicine, talk to your doctor, pharmacist, or health care provider.  2018 [employer] (2013-06-18 14:19:39)

## 2016-11-03 NOTE — Progress Notes (Signed)
Dr. Burr Medico okay to use CBC and CMET results from 9/24.

## 2016-11-07 NOTE — Progress Notes (Signed)
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      10/10/2014 Miscellaneous    Peripheral blood chemistry and leukocytosis with total white count 78K, comprised of large plasma cells and his normocytic anemia. There is a myeloid left shift with previous surgical radium blasts. Flow cytometry showed 64% plasma cells      10/10/2014 Bone Marrow Biopsy    Markedly hypercellular marrow (95%), Atypical plasma cells comprise 57% of the cellularity. There was diminished multilineage in hematopoiesis with adequate maturation. Breasts less than 1%), no overt dysplasia of the myeloid or erythroid lineages.       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      05/07/2015 - 05/12/2015 Hospital Admission    [facility], [facility] ID workup was negative, EGD showed evidence of gastritis and duodenitis, no H. pylori or  CMV.      05/20/2015 - 05/24/2015 Hospital Ad[facility]    [facility]      07/11/2015 Bone Marrow Biopsy    Post transplant 100 a bone marrow biopsy showed hypocellular marrow, 20%, no increase in plasma cells (2%) or other abnormalities.      08/22/2015 - 01/02/2016 Chemotherapy    MaintenanceCyborD (cytoxan 301mm2 iv, bortezomib 1.5 mg/m, dexamethasone 40 mg, every 2 weeks, changed to Velcade maintenance after 4 months treatment      01/16/2016 - 04/19/2016 Chemotherapy    Maintenance Velcade 1.3 mg/m every 2 weeks      02/22/2016 Pathology Results    BONE MARROW: Diagnosis Bone Marrow, Aspirate,Biopsy, and Clot, right iliac - NORMOCELLULAR BONE MARROW FOR AGE WITH TRILINEAGE HEMATOPOIESIS. - PLASMACYTOSIS (PLASMA CELLS 12%). - SEE COMMENT. PERIPHERAL BLOOD: - OCCASIONAL CIRCULATING PLASMA CELLS.      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 female is here because of recently diagnosed plasma cell leukemia. [facility] and is here to establish her local oncological care with [place]She [place] is accompanied  to the clinic by her daughter and interpreter.  She presented to our hospital lth with worsening dyspnea, fatigue, cough, subjective fevers and chills, and was admitted on 09/14/2014. She was found to have allergy WBC 54.1K, hemoglobin 8.1, plt 310, she was treated with symptom management, and was discharged home on 09/17/2014, with a plan to follow-up with hematology. She represents to  emergency room on 10/07/2014, was found to have white count of 78K, and worsening anemia with hemoglobin 6. She was seen by my partner Dr. Jonette Eva and plasma cell leukemia was suspected. She was transferred to [facility] for further leukemia work-up and treatment. Bone marrow biopsy was done, which confirmed plasma cell leukemia, and she started chemotherapy with Velcade, Cytoxan and dexamethasone. She received weekly dose twice, last dose on 10/20/2014. She was discharged to home afterwards. She had a mediport placed during her hospitalization.  She has moderate fatigue, low appetie, she lost about 13 lbs in the past few months. She has moderate abdominal pain, 5-6/10, persistent, she does not take any pain meds.   I reviewed her medical records extensively, and discussed her case with Dr. Jorje Guild and [place] program coordinator.  CURRENT THERAPY:   1. Velcade 1.76m/m2 and dexa 440mweekly 2. Daratumumab weekly started on 04/18/2016, changed to every 2 weeks on 06/25/2016 and changed to every 4 weeks after 10/13/2016, per protocol  3. zometa every 4 weeks started on 11/27/2015   INTERIM HISTORY:   Sheryl [name] is here for a follow up with her interpreter in the infusion room. 106/66 was her BP today.  She notes to feeling well. She will see Dr. RoNorma Fredricksonn 12/2016. [place].  One of her medications, omeprazole she needs a refill. She notes to getting a sore throat but resolves with salt water gargle. She eats well and her weight is stable.  She is taking her Metformin. Overall she is doing well.    MEDICAL HISTORY:  Past Medical History:  Diagnosis Date  . Chills with fever    intermittently since d/c from hospital  . Dysuria-frequency syndrome    w/ pink urine  . GERD (gastroesophageal reflux disease)   . Hepatitis   . History of positive PPD    DX 2011--  CXR DONE NO EVIDENCE  . History of  ureter stent   . Hydronephrosis, right   . Neuromuscular disorder (HCC)    legs numb intermittently  . 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  . Pneumonia   . Right ureteral stone   . Urosepsis 8/14   [facility]    SURGICAL HISTORY: Past Surgical History:  Procedure Laterality Date  . CYSTOSCOPY W/ URETERAL STENT PLACEMENT Right 09/25/2012   Procedure: CYSTOSCOPY WITH RETROGRADE PYELOGRAM/URETERAL STENT PLACEMENT;  Surgeon: ThAlexis FrockMD;  Location: [facility];  Service: Urology;  Laterality: Right;  . CYSTOSCOPY WITH RETROGRADE PYELOGRAM, URETEROSCOPY AND STENT PLACEMENT Right 10/15/2012   Procedure: CYSTOSCOPY WITH RETROGRADE PYELOGRAM, URETEROSCOPY AND REMOVAL STENT WITH  STENT PLACEMENT;  Surgeon: ThAlexis FrockMD;  [facility];  Laterality: Right;  . ESOPHAGOGASTRODUODENOSCOPY (EGD) WITH PROPOFOL N/A 11/16/2014   Procedure: ESOPHAGOGASTRODUODENOSCOPY (EGD) WITH PROPOFOL;  Surgeon: DaMilus BanisterMD;  Location: [facility];  Service: Endoscopy;  Laterality: N/A;  . HOLMIUM LASER APPLICATION Right 10/12/63/7846 Procedure: HOLMIUM LASER APPLICATION;  Surgeon: ThAlexis FrockMD;  [facility];  Service: Urology;  Laterality: Right;  . LIVER BIOPSY    . OTHER SURGICAL HISTORY Right    removal of ovarian cyst  . removal of uterine cyst     years ago  . RIGHT VATS W/ DRAINAGE PEURAL EFFUSION AND BX'S  10-30-2008    SOCIAL HISTORY: Social History   Social History  . Marital status: Single    Spouse name: N/A  . Number of children: 3  . Years of education: N/A   Social History Main Topics  . Smoking status: Never Smoker  . Smokeless tobacco: Never Used  . Alcohol use No  . Drug use: No  . Sexual activity: Not Currently    Birth control/ protection: Abstinence   Other Topics Concern  . None   Social History Narrative  . None    FAMILY HISTORY: Family History  Problem Relation Age of Onset  . Stomach  cancer Mother   . Lung disease Father   . Asthma Father     ALLERGIES:  has No Known Allergies.  MEDICATIONS:  Current Outpatient Prescriptions  Medication Sig Dispense Refill  . acetaminophen (TYLENOL) 325 MG tablet Take 2 tablets (650 mg total) by mouth every 6 (six) hours as needed for mild pain (or Fever >/= 101). 30 tablet 0  . acyclovir (ZOVIRAX) 800 MG tablet Take 1 tablet (800 mg total) by mouth 2 (two) times daily. 60 tablet 2  . dexamethasone (DECADRON) 4 MG tablet Take 10 tablets (40 mg total) by mouth once a week. 40 tablet 2  . metFORMIN (GLUCOPHAGE) 500 MG tablet Take 1 tablet (500 mg total) by mouth 2 (two) times daily with a meal. 60 tablet 2  . omeprazole (PRILOSEC) 20 MG capsule Take 1 capsule (20 mg total) by mouth daily. 30 capsule 5  . ondansetron (ZOFRAN) 8 MG tablet Take 1 tablet (8 mg total) by mouth every 8 (eight) hours as needed for nausea or vomiting. 30 tablet 1  . polyethylene glycol (MIRALAX / GLYCOLAX) packet Take 17 g by mouth daily as needed.    . promethazine (PHENERGAN) 25 MG tablet Take 1 tablet (25 mg total) by mouth every 6 (six) hours as needed for nausea or vomiting. 30 tablet 0   No current facility-administered medications for this visit.    Facility-Administered Medications Ordered in Other Visits  Medication Dose Route Frequency Provider Last Rate Last Dose  . 0.9 %  sodium chloride infusion   Intravenous Once Truitt Merle, MD      . bortezomib SQ (VELCADE) chemo injection 2.5 mg  1.5 mg/m2 (Treatment Plan Recorded) Subcutaneous Once Truitt Merle, MD      . daratumumab [facility]) 900 mg in sodium chloride 0.9 % 455 mL chemo infusion  16 mg/kg (Treatment Plan Recorded) Intravenous Once Truitt Merle, MD      . heparin lock flush 100 unit/mL  500 Units Intracatheter Once PRN Truitt Merle, MD      . sodium chloride 0.9 % injection 10 mL  10 mL Intravenous PRN Truitt Merle, MD   10 mL at 12/11/15 1532  . sodium chloride 0.9 % injection 10 mL  10 mL Intravenous PRN  Truitt Merle, MD   10 mL at 06/11/16 1505  . sodium chloride flush (NS) 0.9 % injection 10 mL  10 mL Intravenous PRN Truitt Merle, MD   10 mL at 10/09/15 1717  . sodium chloride flush (NS) 0.9 % injection 10 mL  10 mL Intracatheter PRN Truitt Merle, MD  REVIEW OF SYSTEMS:  Constitutional: no abnormal night sweats  Eyes: Denies blurriness of vision, double vision or watery eyes.  Ears, nose, mouth, throat, and face: Denies mucositis (+) Sore throat  Respiratory: Denies, dyspnea or wheezes  Cardiovascular: Denies palpitation, chest discomfort or lower extremity swelling Gastrointestinal:  Denies nausea, heartburn, reports regular bowel/bladder Skin: Denies abnormal skin rashes Lymphatics: Denies new lymphadenopathy or easy bruising Neurological:Denies numbness, tingling or new weaknesses Behavioral/Psych: Mood is stable, no new changes  All other systems were reviewed with the patient and are negative.  PHYSICAL EXAMINATION:   ECOG PERFORMANCE STATUS: 0 BP 104/79, HR 80, RR 17, T 36.5, O2sat 100% on RA GENERAL:alert, no distress and comfortable SKIN: skin color, texture, turgor are normal, no rashes or significant lesions EYES: normal, conjunctiva are pink and non-injected, sclera clear OROPHARYNX:no exudate, no erythema and lips, buccal mucosa  NECK: supple, thyroid normal size, non-tender, without nodularity LYMPH:  no palpable lymphadenopathy in the cervical, axillary or inguinal LUNGS: clear to auscultation and percussion with normal breathing effort HEART: regular rate & rhythm and no murmurs and no lower extremity edema ABDOMEN:abdomen soft, mildly tender, no hepatomegaly  Musculoskeletal:no cyanosis of digits and no clubbing  PSYCH: alert & oriented x 3 with fluent speech NEURO: no focal motor/sensory deficits  LABORATORY DATA:  I have reviewed the data as listed CBC Latest Ref Rng & Units 11/10/2016 10/27/2016 10/13/2016  WBC 3.9 - 10.3 10e3/uL 5.3 5.4 6.9  Hemoglobin 11.6 -  15.9 g/dL 9.8(L) 9.9(L) 9.8(L)  Hematocrit 34.8 - 46.6 % 31.8(L) 32.1(L) 32.1(L)  Platelets 145 - 400 10e3/uL 165 183 192    CMP Latest Ref Rng & Units 11/10/2016 10/27/2016 10/27/2016  Glucose 70 - 140 mg/dl 93 100 -  BUN 7.0 - 26.0 mg/dL 14.1 14.1 -  Creatinine 0.6 - 1.1 mg/dL 0.7 0.7 -  Sodium 136 - 145 mEq/L 141 141 -  Potassium 3.5 - 5.1 mEq/L 4.1 4.0 -  Chloride 101 - 111 mmol/L - - -  CO2 22 - 29 mEq/L 25 25 -  Calcium 8.4 - 10.4 mg/dL 9.6 8.9 -  Total Protein 6.4 - 8.3 g/dL 6.5 6.4 6.0  Total Bilirubin 0.20 - 1.20 mg/dL <0.22 0.31 -  Alkaline Phos 40 - 150 U/L 103 92 -  AST 5 - 34 U/L 27 21 -  ALT 0 - 55 U/L 9 <6 -    SPEP M-protein  09/15/2014: 4.2 10/08/14: 4.6 12/08/2014: 0.2 02/15/2015: not sufficient sample for test   09/11/2015: not observed 10/09/2015: not det   11/06/2015: not det  12/25/2015: not det  02/13/2016: 0.3 03/12/2016: 0.8 04/09/2016: 0.6 05/08/16: 0.1 06/04/16: 0.1 07/02/2016: 0.2 07/09/2016: 0.2 (dara specific IFE negative) 08/05/16: Not observed  09/01/16: Not observed 09/29/16: 0.1(dara specific IFE negative) 10/27/16: Not observed   IgG mg/dl 11/03/2014: 3150  12/08/2014:  759 02/14/2014: 860 09/11/2015: 1347 10/09/2015: 1457 11/06/2015: 1504 12/25/2015: 1400 02/13/2016: 1543 03/12/2016: 1860 04/09/2016: 1620 05/08/16: 749 06/04/16: 751 5/30/20187: 796 07/09/2016: 701 08/05/16: 678 09/01/16: 650 09/29/16: 727 10/27/16: 634   Kappa/lambda light chains levels and ration  11/03/14: 0.75, 152, 0.00 12/22/2014: 1.10, 2.60, 0.42 02/15/2015: 9.59,14.35, 0.67 09/11/2015: 2.44, 2.72, 0.90 10/09/2015: 3.09, 3.49, 0.89 11/06/2015: 2.30, 2.62, 0.88 12/25/2015: 2.51, 3.0, 0.84 02/13/2016: 2.64, 15.3, 0.17 03/12/2016: 2.27, 45.5, 0.05 04/09/2016: 3.33, 45.5, 0.07 05/08/2016: 0.49, 1.21, 0.40 06/04/2016: 0.83, 1.11, 0.75 07/02/16: 0.51, 1.43, 0.36 07/23/16: 0.68, 1.02, 0.67 08/05/16: 0.62, 1.09, 0.57 09/01/16: 0.80, 1.21, 0.66 09/29/16: 0.44, 1.07, 0.41 10/27/16: 0.63, 1.44, 0.44  24 h  urine UPEP/IFE and light chain: 11/03/2014: IFE showed a monoclonal IgG heavy chain with associated lambda light chain. M protein was Undetectable   PATHOLOGY REPORT  Bone Marrow (BM) and Peripheral Blood (PB) FINAL PATHOLOGIC DIAGNOSIS  BONE MARROW: 07/11/2015 Hypocellular marrow (20%) with no increase in plasma cells (2%). See comment.  PERIPHERAL BLOOD: Mild anemia. No circulating plasma cells identified. See comment and CBC data.  BONE MARROW: 02/22/2016 Diagnosis Bone Marrow, Aspirate,Biopsy, and Clot, right iliac - NORMOCELLULAR BONE MARROW FOR AGE WITH TRILINEAGE HEMATOPOIESIS. - PLASMACYTOSIS (PLASMA CELLS 12%). - SEE COMMENT. PERIPHERAL BLOOD: - OCCASIONAL CIRCULATING PLASMA CELLS.  RADIOGRAPHIC STUDIES: I have personally reviewed the radiological images as listed and agreed with the findings in the report. No results found.   CXR 05/08/2016 IMPRESSION:  No acute disease.  CT Biopsy 02/22/16 IMPRESSION: 1. Technically successful CT guided right iliac bone core and aspiration biopsy.  ASSESSMENT & PLAN: 46 y.o. [place] woman, presented with anemia and leokocytosis   1. 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 Unfortunately this showed increased plasma cells 12% with additional lambda light chain staining pending. This is relapse of disease. Cytogenetics was normal  -Her M protein and lambda free light chain level has significantly increased lately, consistent with disease relapse -Her recent LP showed negative CSF, no evidence of CNS involvement. -I have previously spoken with Dr. Norma Fredrickson, we agreed to change her CyBorD to weekly, and add daratumumab weekly, starting  04/18/16. Potential side effects would discussed with patient, especially cytopenia and infusion reaction, she will take dexamethasone the day before Dara infusion.  -If her disease does not respond to treatment quickly, or if she has intolerance issue to cyBorD, I will change to pomalidomide,  dexamethasone and Dara  -continue acyclovir  -Continue Zometa every 4 weeks for a total of 2 years (until 11/2017) - Due to her hospitalization for severe cytopenia and infection, Cytoxan has been held. -she has restarted weekly Velcade and dexa, and now on Dara every 2 weeks, tolerating well, will continue. Her recent dara-specific IFE was negative for M-protein on 07/07/16 at [facility], she has achieved second CR.  - continue Velcade injection and dexa 43m weekly, and Dara infusion every 2 weeks, will change to every 4 weeks as maintenance therapy after next infusion - she is responding to treatment well, SPEP M protein has been undetectable-0.2 with normal IFE (dara specific) since early July 2018, she has achieved CR again  -She continues to take dexamethasone 452mweekly on the day before getting Velcade injection, which is weekly  -per Dara protocol, we will change to every 4 weeks since her last treatment on 10/13/2016 -I have [facility]. She missed her appointment with him on 9/5, she will reschedule to 12/2016 -She is clinically doing well today. We reviewed her labs. Her last M-Protein was negative, she is still in remission. Her CBCs and CMPs are stable, including her mild anemia, HG at 9.8 today. Her exam was unremarkable.  -I advices her to continue her metformin, and weekly dexa and Velcade injection and monthly Dara infusion as she is responding well to treatment.  -F/u in 1 month, she knows to contact us[place]oreaf she has  any concerns or significant symptoms.   2. Type 2 diabetes, steroids induced  -She was noticed to have increased blood glucose level  lately, with random blood glucose above 200. She also developed diabetic symptoms. No history of diabetes in the past. -This is likely steroids induced hyperglycemia -I strongly recommend her to avoid any soft drink and sweats, and watch her carbohydrate intake, and exercise more  -She has been seen by her primary care physician. She was not given any medication to manage her glucose. I will call her primary care physician to discuss adding metformin. -I previously instructed her to take metformin every day since she is now taking dexamethasone more frequently.  -She restarted her dexamethasone weekly as part of his leukemia treatment, and she will also receive steroids as premedication, I recommend her to take metformin 500 mg twice daily continuously, to control her hyperglycemia.Potential side effects discussed, she agreed to proceed. -Continue close monitoring. -We previously discussed restarting to metformin one pill a day while on steroids to see if her levels will stabilize. I previously encouraged her to take it in the morning with breakfast.  -Due to increase in steroids, Pt will increase metformin to twice daily. I previously  discussed with her, she voiced understanding and will change from daily to twice daily.   3. Chronic Hepatitis B  - She will follow-up with the GI clinic at [facility] -continue entecavir, but she run out 2 weeks ago and has not refilled due to high copay.  Our nursing staff previously called her GI physician's office to request this for her.  - I previously encouraged her to call Dr at [facility] to get help with medication prices -She has not taken medication in 2-3 months.  -She is able to afford her $250 copay, but still not able to get in touch with GI clinic. My nurse has contacted [place] clinic at least twice to request refill for her but it has not been refilled.  - Pt has not gotten her medication refilled. I previously referred her to ID in [place]. She  reports that they did not call her and she has not been scheduled for this. I will re-refer her for this.  -She has not been contacted by ID in [place],  But she will f/u with GI in [place] in 12/2016 if she needed.  4. GERD, recent GI bleeding and nausea  -Continue omeprazole daily. We previously discussed steroids can worsen her acid reflux. -She previously had an EGD at [facility] in 2017. This was benign.  -she did not do her colonoscopy because she did not have anyone to accompany her during visit. She has recently seen Dr. Ardis Hughs again, and is scheduled for colonoscopy.  5. Constipation -I previously encouraged the patient to try senca-s, ducalax 1-3 times a day or milk of magnesium if miralax doresn't help with constipation  -I previously advised the patient to take something everyday to help with constipation. .   Plan  -Refill Omeprazole today -Lab reviewed and adequate for Dara treatment and velcade today -Continue with weekly Velcade/dex with lab every 2 weeks -f/u in 4 weeks    All questions were answered. The patient knows to call the clinic with any problems, questions or concerns.  I spent 20 minutes counseling the patient face to face. We used the vedio interpreter service. The total time spent in the appointment was 20 minutes and more than 50% was on counseling.   This document serves as a record  of services personally performed by Truitt Merle, MD. It was created on her behalf by Joslyn Devon, a trained medical scribe. The creation of this record is based on the scribe's personal observations and the provider's statements to them. This document has been checked and approved by the attending provider.    Truitt Merle, MD  11/10/2016

## 2016-11-10 ENCOUNTER — Ambulatory Visit (HOSPITAL_BASED_OUTPATIENT_CLINIC_OR_DEPARTMENT_OTHER): Payer: Medicaid Other | Admitting: Hematology

## 2016-11-10 ENCOUNTER — Encounter: Payer: Self-pay | Admitting: Hematology

## 2016-11-10 ENCOUNTER — Other Ambulatory Visit (HOSPITAL_BASED_OUTPATIENT_CLINIC_OR_DEPARTMENT_OTHER): Payer: Medicaid Other

## 2016-11-10 ENCOUNTER — Ambulatory Visit (HOSPITAL_BASED_OUTPATIENT_CLINIC_OR_DEPARTMENT_OTHER): Payer: Medicaid Other

## 2016-11-10 ENCOUNTER — Telehealth: Payer: Self-pay | Admitting: Hematology

## 2016-11-10 VITALS — BP 108/76 | HR 88 | Temp 97.8°F | Resp 18

## 2016-11-10 DIAGNOSIS — B181 Chronic viral hepatitis B without delta-agent: Secondary | ICD-10-CM | POA: Diagnosis not present

## 2016-11-10 DIAGNOSIS — Z5112 Encounter for antineoplastic immunotherapy: Secondary | ICD-10-CM | POA: Diagnosis not present

## 2016-11-10 DIAGNOSIS — T380X5A Adverse effect of glucocorticoids and synthetic analogues, initial encounter: Secondary | ICD-10-CM

## 2016-11-10 DIAGNOSIS — D649 Anemia, unspecified: Secondary | ICD-10-CM

## 2016-11-10 DIAGNOSIS — C9012 Plasma cell leukemia in relapse: Secondary | ICD-10-CM

## 2016-11-10 DIAGNOSIS — K5900 Constipation, unspecified: Secondary | ICD-10-CM

## 2016-11-10 DIAGNOSIS — E099 Drug or chemical induced diabetes mellitus without complications: Secondary | ICD-10-CM | POA: Diagnosis not present

## 2016-11-10 DIAGNOSIS — K219 Gastro-esophageal reflux disease without esophagitis: Secondary | ICD-10-CM | POA: Diagnosis not present

## 2016-11-10 DIAGNOSIS — C9011 Plasma cell leukemia in remission: Secondary | ICD-10-CM

## 2016-11-10 DIAGNOSIS — C9010 Plasma cell leukemia not having achieved remission: Secondary | ICD-10-CM

## 2016-11-10 LAB — CBC WITH DIFFERENTIAL/PLATELET
BASO%: 0.2 % (ref 0.0–2.0)
Basophils Absolute: 0 10*3/uL (ref 0.0–0.1)
EOS%: 0.4 % (ref 0.0–7.0)
Eosinophils Absolute: 0 10*3/uL (ref 0.0–0.5)
HEMATOCRIT: 31.8 % — AB (ref 34.8–46.6)
HEMOGLOBIN: 9.8 g/dL — AB (ref 11.6–15.9)
LYMPH#: 0.6 10*3/uL — AB (ref 0.9–3.3)
LYMPH%: 11.7 % — ABNORMAL LOW (ref 14.0–49.7)
MCH: 26.8 pg (ref 25.1–34.0)
MCHC: 30.8 g/dL — ABNORMAL LOW (ref 31.5–36.0)
MCV: 86.9 fL (ref 79.5–101.0)
MONO#: 0.5 10*3/uL (ref 0.1–0.9)
MONO%: 9.2 % (ref 0.0–14.0)
NEUT%: 78.5 % — ABNORMAL HIGH (ref 38.4–76.8)
NEUTROS ABS: 4.2 10*3/uL (ref 1.5–6.5)
PLATELETS: 165 10*3/uL (ref 145–400)
RBC: 3.66 10*6/uL — ABNORMAL LOW (ref 3.70–5.45)
RDW: 15.9 % — AB (ref 11.2–14.5)
WBC: 5.3 10*3/uL (ref 3.9–10.3)

## 2016-11-10 LAB — COMPREHENSIVE METABOLIC PANEL
ALBUMIN: 3.4 g/dL — AB (ref 3.5–5.0)
ALT: 9 U/L (ref 0–55)
AST: 27 U/L (ref 5–34)
Alkaline Phosphatase: 103 U/L (ref 40–150)
Anion Gap: 7 mEq/L (ref 3–11)
BUN: 14.1 mg/dL (ref 7.0–26.0)
CALCIUM: 9.6 mg/dL (ref 8.4–10.4)
CO2: 25 meq/L (ref 22–29)
Chloride: 109 mEq/L (ref 98–109)
Creatinine: 0.7 mg/dL (ref 0.6–1.1)
GLUCOSE: 93 mg/dL (ref 70–140)
Potassium: 4.1 mEq/L (ref 3.5–5.1)
Sodium: 141 mEq/L (ref 136–145)
TOTAL PROTEIN: 6.5 g/dL (ref 6.4–8.3)

## 2016-11-10 MED ORDER — SODIUM CHLORIDE 0.9 % IV SOLN
Freq: Once | INTRAVENOUS | Status: AC
  Administered 2016-11-10: 10:00:00 via INTRAVENOUS

## 2016-11-10 MED ORDER — HEPARIN SOD (PORK) LOCK FLUSH 100 UNIT/ML IV SOLN
500.0000 [IU] | Freq: Once | INTRAVENOUS | Status: AC | PRN
  Administered 2016-11-10: 500 [IU]
  Filled 2016-11-10: qty 5

## 2016-11-10 MED ORDER — METHYLPREDNISOLONE SODIUM SUCC 125 MG IJ SOLR
INTRAMUSCULAR | Status: AC
  Filled 2016-11-10: qty 2

## 2016-11-10 MED ORDER — ACETAMINOPHEN 325 MG PO TABS
650.0000 mg | ORAL_TABLET | Freq: Once | ORAL | Status: AC
  Administered 2016-11-10: 650 mg via ORAL

## 2016-11-10 MED ORDER — BORTEZOMIB CHEMO SQ INJECTION 3.5 MG (2.5MG/ML)
1.5000 mg/m2 | Freq: Once | INTRAMUSCULAR | Status: AC
  Administered 2016-11-10: 2.5 mg via SUBCUTANEOUS
  Filled 2016-11-10: qty 2.5

## 2016-11-10 MED ORDER — DIPHENHYDRAMINE HCL 25 MG PO CAPS
25.0000 mg | ORAL_CAPSULE | Freq: Once | ORAL | Status: AC
  Administered 2016-11-10: 25 mg via ORAL

## 2016-11-10 MED ORDER — PROCHLORPERAZINE MALEATE 10 MG PO TABS
ORAL_TABLET | ORAL | Status: AC
  Filled 2016-11-10: qty 1

## 2016-11-10 MED ORDER — METHYLPREDNISOLONE SODIUM SUCC 125 MG IJ SOLR
125.0000 mg | Freq: Once | INTRAMUSCULAR | Status: AC
  Administered 2016-11-10: 125 mg via INTRAVENOUS

## 2016-11-10 MED ORDER — ACETAMINOPHEN 325 MG PO TABS
ORAL_TABLET | ORAL | Status: AC
  Filled 2016-11-10: qty 2

## 2016-11-10 MED ORDER — SODIUM CHLORIDE 0.9% FLUSH
10.0000 mL | INTRAVENOUS | Status: DC | PRN
  Administered 2016-11-10: 10 mL
  Filled 2016-11-10: qty 10

## 2016-11-10 MED ORDER — DIPHENHYDRAMINE HCL 25 MG PO CAPS
ORAL_CAPSULE | ORAL | Status: AC
  Filled 2016-11-10: qty 1

## 2016-11-10 MED ORDER — OMEPRAZOLE 20 MG PO CPDR: 20.0000 mg | DELAYED_RELEASE_CAPSULE | Freq: Every day | ORAL | 5 refills | Status: DC

## 2016-11-10 MED ORDER — SODIUM CHLORIDE 0.9 % IV SOLN
16.0000 mg/kg | Freq: Once | INTRAVENOUS | Status: AC
  Administered 2016-11-10: 900 mg via INTRAVENOUS
  Filled 2016-11-10: qty 40

## 2016-11-10 MED ORDER — PROCHLORPERAZINE MALEATE 10 MG PO TABS
10.0000 mg | ORAL_TABLET | Freq: Once | ORAL | Status: AC
  Administered 2016-11-10: 10 mg via ORAL

## 2016-11-10 NOTE — Telephone Encounter (Signed)
Appointments complete per 10/8 los. Patient to get updated schedule at next visit 10/15.

## 2016-11-10 NOTE — Patient Instructions (Signed)
[facility] Discharge Instructions for Patients Receiving Chemotherapy  Today you received the following chemotherapy agents:  Velcade  To help prevent nausea and vomiting after your treatment, we encourage you to take your nausea medication.  Daratumumab injection What is this medicine? DARATUMUMAB (dar a toom ue mab) is a monoclonal antibody. It is used to treat multiple myeloma. This medicine may be used for other purposes; ask your health care provider or pharmacist if you have questions. COMMON BRAND NAME(S): DARZALEX What should I tell my health care provider before I take this medicine? They need to know if you have any of these conditions: -infection (especially a virus infection such as chickenpox, cold sores, or herpes) -lung or breathing disease -pregnant or trying to get pregnant -breast-feeding -an unusual or allergic reaction to daratumumab, other medicines, foods, dyes, or preservatives How should I use this medicine? This medicine is for infusion into a vein. It is given by a health care professional in a hospital or clinic setting. Talk to your pediatrician regarding the use of this medicine in children. Special care may be needed. Overdosage: If you think you have taken too much of this medicine contact a poison control center or emergency room at once. NOTE: This medicine is only for you. Do not share this medicine with others. What if I miss a dose? Keep appointments for follow-up doses as directed. It is important not to miss your dose. Call your doctor or health care professional if you are unable to keep an appointment. What may interact with this medicine? Interactions have not been studied. Give your health care provider a list of all the medicines, herbs, non-prescription drugs, or dietary supplements you use. Also tell them if you smoke, drink alcohol, or use illegal drugs. Some items may interact with your medicine. This list may not describe all  possible interactions. Give your health care provider a list of all the medicines, herbs, non-prescription drugs, or dietary supplements you use. Also tell them if you smoke, drink alcohol, or use illegal drugs. Some items may interact with your medicine. What should I watch for while using this medicine? This drug may make you feel generally unwell. Report any side effects. Continue your course of treatment even though you feel ill unless your doctor tells you to stop. This medicine can cause serious allergic reactions. To reduce your risk you may need to take medicine before treatment with this medicine. Take your medicine as directed. This medicine can affect the results of blood tests to match your blood type. These changes can last for up to 6 months after the final dose. Your healthcare provider will do blood tests to match your blood type before you start treatment. Tell all of your healthcare providers that you are being treated with this medicine before receiving a blood transfusion. This medicine can affect the results of some tests used to determine treatment response; extra tests may be needed to evaluate response. Do not become pregnant while taking this medicine or for 3 months after stopping it. Women should inform their doctor if they wish to become pregnant or think they might be pregnant. There is a potential for serious side effects to an unborn child. Talk to your health care professional or pharmacist for more information. What side effects may I notice from receiving this medicine? Side effects that you should report to your doctor or health care professional as soon as possible: -allergic reactions like skin rash, itching or hives, swelling of the  face, lips, or tongue -breathing problems -chills -cough -dizziness -feeling faint or lightheaded -headache -low blood counts - this medicine may decrease the number of white blood cells, red blood cells and platelets. You may be at  increased risk for infections and bleeding. -nausea, vomiting -shortness of breath -signs of decreased platelets or bleeding - bruising, pinpoint red spots on the skin, black, tarry stools, blood in the urine -signs of decreased red blood cells - unusually weak or tired, feeling faint or lightheaded, falls -signs of infection - fever or chills, cough, sore throat, pain or difficulty passing urine Side effects that usually do not require medical attention (report to your doctor or health care professional if they continue or are bothersome): -back pain -diarrhea -muscle cramps -pain, tingling, numbness in the hands or feet -swelling of the ankles, feet, hands -tiredness This list may not describe all possible side effects. Call your doctor for medical advice about side effects. You may report side effects to FDA at 1-800-FDA-1088. Where should I keep my medicine? Keep out of the reach of children. This drug is given in a hospital or clinic and will not be stored at home. NOTE: This sheet is a summary. It may not cover all possible information. If you have questions about this medicine, talk to your doctor, pharmacist, or health care provider.  2018 Elsevier/Gold Standard (2015-02-22 10:38:11)    If you develop nausea and vomiting that is not controlled by your nausea medication, call the clinic.   BELOW ARE SYMPTOMS THAT SHOULD BE REPORTED IMMEDIATELY:  *FEVER GREATER THAN 100.5 F  *CHILLS WITH OR WITHOUT FEVER  NAUSEA AND VOMITING THAT IS NOT CONTROLLED WITH YOUR NAUSEA MEDICATION  *UNUSUAL SHORTNESS OF BREATH  *UNUSUAL BRUISING OR BLEEDING  TENDERNESS IN MOUTH AND THROAT WITH OR WITHOUT PRESENCE OF ULCERS  *URINARY PROBLEMS  *BOWEL PROBLEMS  UNUSUAL RASH Items with * indicate a potential emergency and should be followed up as soon as possible.  Feel free to call the clinic should you have any questions or concerns. The clinic phone number is [phone].  Please  show the [facility] at check-in to the Emergency Department and triage nurse.

## 2016-11-17 ENCOUNTER — Ambulatory Visit: Payer: Medicaid Other

## 2016-11-17 ENCOUNTER — Ambulatory Visit (HOSPITAL_BASED_OUTPATIENT_CLINIC_OR_DEPARTMENT_OTHER): Payer: Medicaid Other

## 2016-11-17 VITALS — BP 113/67 | HR 75 | Temp 98.1°F | Resp 18

## 2016-11-17 DIAGNOSIS — C9010 Plasma cell leukemia not having achieved remission: Secondary | ICD-10-CM

## 2016-11-17 DIAGNOSIS — Z5112 Encounter for antineoplastic immunotherapy: Secondary | ICD-10-CM

## 2016-11-17 MED ORDER — BORTEZOMIB CHEMO SQ INJECTION 3.5 MG (2.5MG/ML)
1.5000 mg/m2 | Freq: Once | INTRAMUSCULAR | Status: AC
  Administered 2016-11-17: 2.5 mg via SUBCUTANEOUS
  Filled 2016-11-17: qty 2.5

## 2016-11-17 NOTE — Patient Instructions (Signed)
[facility] Discharge Instructions for Patients Receiving Chemotherapy  Today you received the following chemotherapy agents Velcade.  To help prevent nausea and vomiting after your treatment, we encourage you to take your nausea medication as directed.  If you develop nausea and vomiting that is not controlled by your nausea medication, call the clinic.   BELOW ARE SYMPTOMS THAT SHOULD BE REPORTED IMMEDIATELY:  *FEVER GREATER THAN 100.5 F  *CHILLS WITH OR WITHOUT FEVER  NAUSEA AND VOMITING THAT IS NOT CONTROLLED WITH YOUR NAUSEA MEDICATION  *UNUSUAL SHORTNESS OF BREATH  *UNUSUAL BRUISING OR BLEEDING  TENDERNESS IN MOUTH AND THROAT WITH OR WITHOUT PRESENCE OF ULCERS  *URINARY PROBLEMS  *BOWEL PROBLEMS  UNUSUAL RASH Items with * indicate a potential emergency and should be followed up as soon as possible.  Feel free to call the clinic should you have any questions or concerns. The clinic phone number is [phone].  Please show the CHEMO ALERT CARD at check-in to the Emergency Department and triage nurse.   

## 2016-11-19 ENCOUNTER — Ambulatory Visit: Payer: Medicaid Other | Admitting: Internal Medicine

## 2016-11-19 ENCOUNTER — Other Ambulatory Visit: Payer: Self-pay | Admitting: Pharmacist

## 2016-11-24 ENCOUNTER — Other Ambulatory Visit (HOSPITAL_BASED_OUTPATIENT_CLINIC_OR_DEPARTMENT_OTHER): Payer: Medicaid Other

## 2016-11-24 ENCOUNTER — Ambulatory Visit (HOSPITAL_BASED_OUTPATIENT_CLINIC_OR_DEPARTMENT_OTHER): Payer: Medicaid Other

## 2016-11-24 ENCOUNTER — Ambulatory Visit: Payer: Medicaid Other | Admitting: Nurse Practitioner

## 2016-11-24 ENCOUNTER — Ambulatory Visit: Payer: Medicaid Other

## 2016-11-24 VITALS — BP 101/80 | HR 77 | Temp 98.2°F | Resp 18

## 2016-11-24 DIAGNOSIS — Z5112 Encounter for antineoplastic immunotherapy: Secondary | ICD-10-CM | POA: Diagnosis not present

## 2016-11-24 DIAGNOSIS — C9011 Plasma cell leukemia in remission: Secondary | ICD-10-CM

## 2016-11-24 DIAGNOSIS — C9010 Plasma cell leukemia not having achieved remission: Secondary | ICD-10-CM

## 2016-11-24 DIAGNOSIS — C9012 Plasma cell leukemia in relapse: Secondary | ICD-10-CM | POA: Diagnosis not present

## 2016-11-24 LAB — COMPREHENSIVE METABOLIC PANEL
ALBUMIN: 3.4 g/dL — AB (ref 3.5–5.0)
ALK PHOS: 83 U/L (ref 40–150)
ALT: 11 U/L (ref 0–55)
AST: 29 U/L (ref 5–34)
Anion Gap: 9 mEq/L (ref 3–11)
BILIRUBIN TOTAL: 0.26 mg/dL (ref 0.20–1.20)
BUN: 14.1 mg/dL (ref 7.0–26.0)
CALCIUM: 8.9 mg/dL (ref 8.4–10.4)
CO2: 24 mEq/L (ref 22–29)
Chloride: 108 mEq/L (ref 98–109)
Creatinine: 0.7 mg/dL (ref 0.6–1.1)
EGFR: >60 mL/min/{1.73_m2} (ref >60)
Glucose: 94 mg/dl (ref 70–140)
POTASSIUM: 4.1 meq/L (ref 3.5–5.1)
Sodium: 141 mEq/L (ref 136–145)
TOTAL PROTEIN: 6.4 g/dL (ref 6.4–8.3)

## 2016-11-24 LAB — CBC WITH DIFFERENTIAL/PLATELET
BASO%: 0.2 % (ref 0.0–2.0)
BASOS ABS: 0 10*3/uL (ref 0.0–0.1)
EOS%: 0.8 % (ref 0.0–7.0)
Eosinophils Absolute: 0 10*3/uL (ref 0.0–0.5)
HEMATOCRIT: 33.1 % — AB (ref 34.8–46.6)
HEMOGLOBIN: 10.2 g/dL — AB (ref 11.6–15.9)
LYMPH#: 1 10*3/uL (ref 0.9–3.3)
LYMPH%: 18.1 % (ref 14.0–49.7)
MCH: 26.8 pg (ref 25.1–34.0)
MCHC: 30.8 g/dL — ABNORMAL LOW (ref 31.5–36.0)
MCV: 86.9 fL (ref 79.5–101.0)
MONO#: 0.4 10*3/uL (ref 0.1–0.9)
MONO%: 7.6 % (ref 0.0–14.0)
NEUT#: 3.9 10*3/uL (ref 1.5–6.5)
NEUT%: 73.3 % (ref 38.4–76.8)
NRBC: 0 % (ref 0–0)
Platelets: 167 10*3/uL (ref 145–400)
RBC: 3.81 10*6/uL (ref 3.70–5.45)
RDW: 15.7 % — AB (ref 11.2–14.5)
WBC: 5.3 10*3/uL (ref 3.9–10.3)

## 2016-11-24 MED ORDER — SODIUM CHLORIDE 0.9 % IV SOLN: Freq: Once | INTRAVENOUS | Status: DC

## 2016-11-24 MED ORDER — PROCHLORPERAZINE MALEATE 10 MG PO TABS
10.0000 mg | ORAL_TABLET | Freq: Once | ORAL | Status: AC
  Administered 2016-11-24: 10 mg via ORAL

## 2016-11-24 MED ORDER — BORTEZOMIB CHEMO SQ INJECTION 3.5 MG (2.5MG/ML)
1.5000 mg/m2 | Freq: Once | INTRAMUSCULAR | Status: AC
  Administered 2016-11-24: 2.5 mg via SUBCUTANEOUS
  Filled 2016-11-24: qty 2.5

## 2016-11-24 MED ORDER — PROCHLORPERAZINE MALEATE 10 MG PO TABS
ORAL_TABLET | ORAL | Status: AC
  Filled 2016-11-24: qty 1

## 2016-11-24 MED ORDER — PROCHLORPERAZINE EDISYLATE 5 MG/ML IJ SOLN
10.0000 mg | Freq: Once | INTRAMUSCULAR | Status: DC
Start: 2016-11-24 — End: 2016-11-24

## 2016-11-24 NOTE — Patient Instructions (Signed)

## 2016-11-24 NOTE — Patient Instructions (Signed)
[facility] Discharge Instructions for Patients Receiving Chemotherapy  Today you received the following chemotherapy agents Velcade.  To help prevent nausea and vomiting after your treatment, we encourage you to take your nausea medication as directed.  If you develop nausea and vomiting that is not controlled by your nausea medication, call the clinic.   BELOW ARE SYMPTOMS THAT SHOULD BE REPORTED IMMEDIATELY:  *FEVER GREATER THAN 100.5 F  *CHILLS WITH OR WITHOUT FEVER  NAUSEA AND VOMITING THAT IS NOT CONTROLLED WITH YOUR NAUSEA MEDICATION  *UNUSUAL SHORTNESS OF BREATH  *UNUSUAL BRUISING OR BLEEDING  TENDERNESS IN MOUTH AND THROAT WITH OR WITHOUT PRESENCE OF ULCERS  *URINARY PROBLEMS  *BOWEL PROBLEMS  UNUSUAL RASH Items with * indicate a potential emergency and should be followed up as soon as possible.  Feel free to call the clinic should you have any questions or concerns. The clinic phone number is [phone].  Please show the CHEMO ALERT CARD at check-in to the Emergency Department and triage nurse.   

## 2016-11-25 LAB — PROTEIN ELECTROPHORESIS, SERUM
A/G RATIO SPE: 1.1 (ref 0.7–1.7)
ALPHA 1: 0.2 g/dL (ref 0.0–0.4)
Albumin: 3.3 g/dL (ref 2.9–4.4)
Alpha 2: 1 g/dL (ref 0.4–1.0)
Beta: 1 g/dL (ref 0.7–1.3)
GAMMA GLOBULIN: 0.7 g/dL (ref 0.4–1.8)
Globulin, Total: 2.9 g/dL (ref 2.2–3.9)
TOTAL PROTEIN: 6.2 g/dL (ref 6.0–8.5)

## 2016-11-25 LAB — KAPPA/LAMBDA LIGHT CHAINS
IG KAPPA FREE LIGHT CHAIN: 8.3 mg/L (ref 3.3–19.4)
IG LAMBDA FREE LIGHT CHAIN: 21.3 mg/L (ref 5.7–26.3)
KAPPA/LAMBDA FLC RATIO: 0.39 (ref 0.26–1.65)

## 2016-11-26 ENCOUNTER — Encounter: Payer: Self-pay | Admitting: Gastroenterology

## 2016-11-26 ENCOUNTER — Ambulatory Visit (AMBULATORY_SURGERY_CENTER): Payer: Medicaid Other | Admitting: Gastroenterology

## 2016-11-26 VITALS — BP 114/70 | HR 71 | Temp 97.8°F | Resp 15 | Ht 62.0 in | Wt 128.0 lb

## 2016-11-26 DIAGNOSIS — K625 Hemorrhage of anus and rectum: Secondary | ICD-10-CM | POA: Diagnosis not present

## 2016-11-26 DIAGNOSIS — D123 Benign neoplasm of transverse colon: Secondary | ICD-10-CM

## 2016-11-26 MED ORDER — SODIUM CHLORIDE 0.9 % IV SOLN: 500.0000 mL | INTRAVENOUS | Status: DC

## 2016-11-26 NOTE — Progress Notes (Signed)
A and O x3. Report to RN. Tolerated MAC anesthesia well.

## 2016-11-26 NOTE — Op Note (Signed)
[facility] Patient Name: Sheryl [name] Procedure Date: 11/26/2016 2:36 PM MRN: [id number] Endoscopist: Milus Banister , MD Age: 46 Referring MD:  Date of Birth: 11/12/1970 Gender: Female Account #: [id number] Procedure:                Colonoscopy Indications:              Rectal bleeding Medicines:                Monitored Anesthesia Care Procedure:                Pre-Anesthesia Assessment:                           - Prior to the procedure, a History and Physical                            was performed, and patient medications and                            allergies were reviewed. The patient's tolerance of                            previous anesthesia was also reviewed. The risks                            and benefits of the procedure and the sedation                            options and risks were discussed with the patient.                            All questions were answered, and informed consent                            was obtained. Prior Anticoagulants: The patient has                            taken no previous anticoagulant or antiplatelet                            agents. ASA Grade Assessment: III - A patient with                            severe systemic disease. After reviewing the risks                            and benefits, the patient was deemed in                            satisfactory condition to undergo the procedure.                           After obtaining informed consent, the colonoscope  was passed under direct vision. Throughout the                            procedure, the patient's blood pressure, pulse, and                            oxygen saturations were monitored continuously. The                            Colonoscope was introduced through the anus and                            advanced to the the cecum, identified by                            appendiceal orifice and ileocecal valve. The                    colonoscopy was performed without difficulty. The                            patient tolerated the procedure well. The quality                            of the bowel preparation was good. The ileocecal                            valve, appendiceal orifice, and rectum were                            photographed. Scope In: 2:39:32 PM Scope Out: 2:51:18 PM Scope Withdrawal Time: 0 hours 7 minutes 19 seconds  Total Procedure Duration: 0 hours 11 minutes 46 seconds  Findings:                 A 2 mm polyp was found in the transverse colon. The                            polyp was sessile. The polyp was removed with a                            cold biopsy forceps. Resection and retrieval were                            complete.                           Internal and external hemorrhoids, small.                           The exam was otherwise without abnormality on                            direct and retroflexion views. Complications:            No immediate complications. Estimated blood loss:  None. Estimated Blood Loss:     Estimated blood loss: none. Impression:               - One 2 mm polyp in the transverse colon, removed                            with a cold biopsy forceps. Resected and retrieved.                           - Small internal and external hemorrhoids.                           - The examination was otherwise normal on direct                            and retroflexion views. Recommendation:           - Patient has a contact number available for                            emergencies. The signs and symptoms of potential                            delayed complications were discussed with the                            patient. Return to normal activities tomorrow.                            Written discharge instructions were provided to the                            patient.                           - Resume previous  diet.                           - Continue present medications.                           You will receive a letter within 2-3 weeks with the                            pathology results and my final recommendations.                           If the polyp(s) is proven to be 'pre-cancerous' on                            pathology, you will need repeat colonoscopy in 5                            years. If the polyp(s) is NOT 'precancerous' on  pathology then you should repeat colon cancer                            screening in 10 years with colonoscopy without need                            for colon cancer screening by any method prior to                            then (including stool testing). Milus Banister, MD 11/26/2016 2:54:06 PM This report has been signed electronically.

## 2016-11-26 NOTE — Patient Instructions (Signed)
**   Handouts given on polyps and hemorrhoids  YOU HAD AN ENDOSCOPIC PROCEDURE TODAY AT THE [facility]:   Refer to the procedure report that was given to you for any specific questions about what was found during the examination.  If the procedure report does not answer your questions, please call your gastroenterologist to clarify.  If you requested that your care partner not be given the details of your procedure findings, then the procedure report has been included in a sealed envelope for you to review at your convenience later.  YOU SHOULD EXPECT: Some feelings of bloating in the abdomen. Passage of more gas than usual.  Walking can help get rid of the air that was put into your GI tract during the procedure and reduce the bloating. If you had a lower endoscopy (such as a colonoscopy or flexible sigmoidoscopy) you may notice spotting of blood in your stool or on the toilet paper. If you underwent a bowel prep for your procedure, you may not have a normal bowel movement for a few days.  Please Note:  You might notice some irritation and congestion in your nose or some drainage.  This is from the oxygen used during your procedure.  There is no need for concern and it should clear up in a day or so.  SYMPTOMS TO REPORT IMMEDIATELY:   Following lower endoscopy (colonoscopy or flexible sigmoidoscopy):  Excessive amounts of blood in the stool  Significant tenderness or worsening of abdominal pains  Swelling of the abdomen that is new, acute  Fever of 100F or higher   For urgent or emergent issues, a gastroenterologist can be reached at any hour by calling [phone].   DIET:  We do recommend a small meal at first, but then you may proceed to your regular diet.  Drink plenty of fluids but you should avoid alcoholic beverages for 24 hours.  ACTIVITY:  You should plan to take it easy for the rest of today and you should NOT DRIVE or use heavy machinery until tomorrow (because of the  sedation medicines used during the test).    FOLLOW UP: Our staff will call the number listed on your records the next business day following your procedure to check on you and address any questions or concerns that you may have regarding the information given to you following your procedure. If we do not reach you, we will leave a message.  However, if you are feeling well and you are not experiencing any problems, there is no need to return our call.  We will assume that you have returned to your regular daily activities without incident.  If any biopsies were taken you will be contacted by phone or by letter within the next 1-3 weeks.  Please call us at [phone] if you have not heard about the biopsies in 3 weeks.    SIGNATURES/CONFIDENTIALITY: You and/or your care partner have signed paperwork which will be entered into your electronic medical record.  These signatures attest to the fact that that the information above on your After Visit Summary has been reviewed and is understood.  Full responsibility of the confidentiality of this discharge information lies with you and/or your care-partner. 

## 2016-11-26 NOTE — Progress Notes (Signed)
Called to room to assist during endoscopic procedure.  Patient ID and intended procedure confirmed with present staff. Received instructions for my participation in the procedure from the performing physician. maw 

## 2016-11-27 ENCOUNTER — Telehealth: Payer: Self-pay

## 2016-11-27 ENCOUNTER — Other Ambulatory Visit: Payer: Self-pay | Admitting: Hematology

## 2016-11-27 LAB — IFE, DARA-SPECIFIC, SERUM
IGG (IMMUNOGLOBIN G), SERUM: 685 mg/dL — AB (ref 700–1600)
IgA, Qn, Serum: 29 mg/dL — ABNORMAL LOW (ref 87–352)
IgM, Qn, Serum: 24 mg/dL — ABNORMAL LOW (ref 26–217)

## 2016-11-27 NOTE — Telephone Encounter (Signed)
Number identifier. Left a voicemail.

## 2016-11-27 NOTE — Telephone Encounter (Signed)
  Follow up Call-  Call back number 11/26/2016  Post procedure Call Back phone  # [phone]  Permission to leave phone message Yes  Some recent data might be hidden     Left message

## 2016-12-01 ENCOUNTER — Ambulatory Visit (HOSPITAL_BASED_OUTPATIENT_CLINIC_OR_DEPARTMENT_OTHER): Payer: Medicaid Other

## 2016-12-01 VITALS — BP 113/60 | HR 85 | Temp 98.0°F | Resp 17

## 2016-12-01 DIAGNOSIS — C9012 Plasma cell leukemia in relapse: Secondary | ICD-10-CM

## 2016-12-01 DIAGNOSIS — C9010 Plasma cell leukemia not having achieved remission: Secondary | ICD-10-CM

## 2016-12-01 DIAGNOSIS — Z5112 Encounter for antineoplastic immunotherapy: Secondary | ICD-10-CM

## 2016-12-01 DIAGNOSIS — Z95828 Presence of other vascular implants and grafts: Secondary | ICD-10-CM

## 2016-12-01 MED ORDER — PROCHLORPERAZINE MALEATE 10 MG PO TABS
ORAL_TABLET | ORAL | Status: AC
  Filled 2016-12-01: qty 1

## 2016-12-01 MED ORDER — HEPARIN SOD (PORK) LOCK FLUSH 100 UNIT/ML IV SOLN
500.0000 [IU] | Freq: Once | INTRAVENOUS | Status: AC | PRN
  Administered 2016-12-01: 500 [IU]
  Filled 2016-12-01: qty 5

## 2016-12-01 MED ORDER — SODIUM CHLORIDE 0.9% FLUSH
10.0000 mL | INTRAVENOUS | Status: DC | PRN
  Administered 2016-12-01: 10 mL
  Filled 2016-12-01: qty 10

## 2016-12-01 MED ORDER — SODIUM CHLORIDE 0.9 % IV SOLN
Freq: Once | INTRAVENOUS | Status: AC
  Administered 2016-12-01: 09:00:00 via INTRAVENOUS

## 2016-12-01 MED ORDER — PROCHLORPERAZINE MALEATE 10 MG PO TABS
10.0000 mg | ORAL_TABLET | Freq: Once | ORAL | Status: AC
  Administered 2016-12-01: 10 mg via ORAL

## 2016-12-01 MED ORDER — BORTEZOMIB CHEMO SQ INJECTION 3.5 MG (2.5MG/ML)
1.5000 mg/m2 | Freq: Once | INTRAMUSCULAR | Status: AC
  Administered 2016-12-01: 2.5 mg via SUBCUTANEOUS
  Filled 2016-12-01: qty 2.5

## 2016-12-01 MED ORDER — ZOLEDRONIC ACID 4 MG/100ML IV SOLN
4.0000 mg | Freq: Once | INTRAVENOUS | Status: AC
  Administered 2016-12-01: 4 mg via INTRAVENOUS
  Filled 2016-12-01: qty 100

## 2016-12-01 MED ORDER — PROCHLORPERAZINE EDISYLATE 5 MG/ML IJ SOLN: 10.0000 mg | Freq: Once | INTRAMUSCULAR | Status: DC

## 2016-12-01 NOTE — Progress Notes (Signed)
Dr. Burr Medico okay to tx with labs from 10/22.

## 2016-12-01 NOTE — Patient Instructions (Addendum)
[facility] Discharge Instructions for Patients Receiving Chemotherapy  Today you received the following agents: bortezomib (Velcade) and zolendric acid (Zometa)  To help prevent nausea and vomiting after your treatment, we encourage you to take your nausea medication as directed.    If you develop nausea and vomiting that is not controlled by your nausea medication, call the clinic.   BELOW ARE SYMPTOMS THAT SHOULD BE REPORTED IMMEDIATELY:  *FEVER GREATER THAN 100.5 F  *CHILLS WITH OR WITHOUT FEVER  NAUSEA AND VOMITING THAT IS NOT CONTROLLED WITH YOUR NAUSEA MEDICATION  *UNUSUAL SHORTNESS OF BREATH  *UNUSUAL BRUISING OR BLEEDING  TENDERNESS IN MOUTH AND THROAT WITH OR WITHOUT PRESENCE OF ULCERS  *URINARY PROBLEMS  *BOWEL PROBLEMS  UNUSUAL RASH Items with * indicate a potential emergency and should be followed up as soon as possible.  Feel free to call the clinic should you have any questions or concerns. The clinic phone number is [phone].  Please show the [facility] at check-in to the Emergency Department and triage nurse.

## 2016-12-04 ENCOUNTER — Encounter: Payer: Self-pay | Admitting: Gastroenterology

## 2016-12-08 ENCOUNTER — Ambulatory Visit (HOSPITAL_BASED_OUTPATIENT_CLINIC_OR_DEPARTMENT_OTHER): Payer: Medicaid Other | Admitting: Nurse Practitioner

## 2016-12-08 ENCOUNTER — Ambulatory Visit (HOSPITAL_BASED_OUTPATIENT_CLINIC_OR_DEPARTMENT_OTHER): Payer: Medicaid Other

## 2016-12-08 ENCOUNTER — Encounter: Payer: Self-pay | Admitting: Nurse Practitioner

## 2016-12-08 ENCOUNTER — Other Ambulatory Visit (HOSPITAL_BASED_OUTPATIENT_CLINIC_OR_DEPARTMENT_OTHER): Payer: Medicaid Other

## 2016-12-08 ENCOUNTER — Ambulatory Visit: Payer: Medicaid Other

## 2016-12-08 VITALS — BP 100/58 | HR 83 | Temp 98.0°F | Resp 16

## 2016-12-08 VITALS — BP 110/76 | HR 91 | Temp 98.0°F | Resp 18 | Ht 62.0 in | Wt 133.4 lb

## 2016-12-08 DIAGNOSIS — K219 Gastro-esophageal reflux disease without esophagitis: Secondary | ICD-10-CM

## 2016-12-08 DIAGNOSIS — K5900 Constipation, unspecified: Secondary | ICD-10-CM

## 2016-12-08 DIAGNOSIS — R070 Pain in throat: Secondary | ICD-10-CM

## 2016-12-08 DIAGNOSIS — C9010 Plasma cell leukemia not having achieved remission: Secondary | ICD-10-CM

## 2016-12-08 DIAGNOSIS — D649 Anemia, unspecified: Secondary | ICD-10-CM

## 2016-12-08 DIAGNOSIS — E099 Drug or chemical induced diabetes mellitus without complications: Secondary | ICD-10-CM | POA: Diagnosis not present

## 2016-12-08 DIAGNOSIS — C9012 Plasma cell leukemia in relapse: Secondary | ICD-10-CM

## 2016-12-08 DIAGNOSIS — R05 Cough: Secondary | ICD-10-CM | POA: Diagnosis not present

## 2016-12-08 DIAGNOSIS — B181 Chronic viral hepatitis B without delta-agent: Secondary | ICD-10-CM | POA: Diagnosis not present

## 2016-12-08 DIAGNOSIS — Z95828 Presence of other vascular implants and grafts: Secondary | ICD-10-CM

## 2016-12-08 DIAGNOSIS — R202 Paresthesia of skin: Secondary | ICD-10-CM | POA: Diagnosis not present

## 2016-12-08 DIAGNOSIS — T380X5A Adverse effect of glucocorticoids and synthetic analogues, initial encounter: Secondary | ICD-10-CM

## 2016-12-08 DIAGNOSIS — Z5112 Encounter for antineoplastic immunotherapy: Secondary | ICD-10-CM

## 2016-12-08 LAB — COMPREHENSIVE METABOLIC PANEL
ALBUMIN: 3.1 g/dL — AB (ref 3.5–5.0)
ALK PHOS: 109 U/L (ref 40–150)
ALT: 13 U/L (ref 0–55)
AST: 23 U/L (ref 5–34)
Anion Gap: 8 mEq/L (ref 3–11)
BUN: 12 mg/dL (ref 7.0–26.0)
CALCIUM: 8.9 mg/dL (ref 8.4–10.4)
CO2: 24 mEq/L (ref 22–29)
CREATININE: 0.7 mg/dL (ref 0.6–1.1)
Chloride: 110 mEq/L — ABNORMAL HIGH (ref 98–109)
EGFR: >60 mL/min/{1.73_m2} (ref >60)
GLUCOSE: 113 mg/dL (ref 70–140)
POTASSIUM: 3.5 meq/L (ref 3.5–5.1)
Sodium: 142 mEq/L (ref 136–145)
TOTAL PROTEIN: 6.3 g/dL — AB (ref 6.4–8.3)

## 2016-12-08 LAB — CBC WITH DIFFERENTIAL/PLATELET
BASO%: 0.2 % (ref 0.0–2.0)
BASOS ABS: 0 10*3/uL (ref 0.0–0.1)
EOS ABS: 0 10*3/uL (ref 0.0–0.5)
EOS%: 0.3 % (ref 0.0–7.0)
HEMATOCRIT: 31.6 % — AB (ref 34.8–46.6)
HEMOGLOBIN: 9.7 g/dL — AB (ref 11.6–15.9)
LYMPH#: 0.9 10*3/uL (ref 0.9–3.3)
LYMPH%: 15.5 % (ref 14.0–49.7)
MCH: 26.9 pg (ref 25.1–34.0)
MCHC: 30.7 g/dL — ABNORMAL LOW (ref 31.5–36.0)
MCV: 87.8 fL (ref 79.5–101.0)
MONO#: 0.6 10*3/uL (ref 0.1–0.9)
MONO%: 9.5 % (ref 0.0–14.0)
NEUT%: 74.5 % (ref 38.4–76.8)
NEUTROS ABS: 4.3 10*3/uL (ref 1.5–6.5)
Platelets: 191 10*3/uL (ref 145–400)
RBC: 3.6 10*6/uL — ABNORMAL LOW (ref 3.70–5.45)
RDW: 15.2 % — AB (ref 11.2–14.5)
WBC: 5.8 10*3/uL (ref 3.9–10.3)

## 2016-12-08 MED ORDER — SODIUM CHLORIDE 0.9% FLUSH
10.0000 mL | INTRAVENOUS | Status: DC | PRN
  Administered 2016-12-08: 10 mL
  Filled 2016-12-08: qty 10

## 2016-12-08 MED ORDER — PROCHLORPERAZINE EDISYLATE 5 MG/ML IJ SOLN
10.0000 mg | Freq: Once | INTRAMUSCULAR | Status: DC
Start: 2016-12-08 — End: 2016-12-08

## 2016-12-08 MED ORDER — AZITHROMYCIN 500 MG PO TABS: 500.0000 mg | ORAL_TABLET | Freq: Every day | ORAL | 0 refills | Status: DC

## 2016-12-08 MED ORDER — METHYLPREDNISOLONE SODIUM SUCC 125 MG IJ SOLR
INTRAMUSCULAR | Status: AC
  Filled 2016-12-08: qty 2

## 2016-12-08 MED ORDER — DIPHENHYDRAMINE HCL 25 MG PO CAPS
25.0000 mg | ORAL_CAPSULE | Freq: Once | ORAL | Status: AC
  Administered 2016-12-08: 25 mg via ORAL

## 2016-12-08 MED ORDER — SODIUM CHLORIDE 0.9 % IJ SOLN
10.0000 mL | INTRAMUSCULAR | Status: DC | PRN
  Administered 2016-12-08: 10 mL via INTRAVENOUS
  Filled 2016-12-08: qty 10

## 2016-12-08 MED ORDER — ACETAMINOPHEN 325 MG PO TABS
650.0000 mg | ORAL_TABLET | Freq: Once | ORAL | Status: AC
  Administered 2016-12-08: 650 mg via ORAL

## 2016-12-08 MED ORDER — METHYLPREDNISOLONE SODIUM SUCC 125 MG IJ SOLR
125.0000 mg | Freq: Once | INTRAMUSCULAR | Status: AC
  Administered 2016-12-08: 125 mg via INTRAVENOUS

## 2016-12-08 MED ORDER — SODIUM CHLORIDE 0.9 % IV SOLN
Freq: Once | INTRAVENOUS | Status: AC
  Administered 2016-12-08: 10:00:00 via INTRAVENOUS

## 2016-12-08 MED ORDER — BORTEZOMIB CHEMO SQ INJECTION 3.5 MG (2.5MG/ML)
1.5000 mg/m2 | Freq: Once | INTRAMUSCULAR | Status: AC
  Administered 2016-12-08: 2.5 mg via SUBCUTANEOUS
  Filled 2016-12-08: qty 2.5

## 2016-12-08 MED ORDER — DEXAMETHASONE 4 MG PO TABS
ORAL_TABLET | ORAL | Status: AC
  Filled 2016-12-08: qty 10

## 2016-12-08 MED ORDER — PROCHLORPERAZINE MALEATE 10 MG PO TABS
10.0000 mg | ORAL_TABLET | Freq: Once | ORAL | Status: AC
  Administered 2016-12-08: 10 mg via ORAL

## 2016-12-08 MED ORDER — PROCHLORPERAZINE MALEATE 10 MG PO TABS
ORAL_TABLET | ORAL | Status: AC
  Filled 2016-12-08: qty 1

## 2016-12-08 MED ORDER — DIPHENHYDRAMINE HCL 25 MG PO CAPS
ORAL_CAPSULE | ORAL | Status: AC
  Filled 2016-12-08: qty 1

## 2016-12-08 MED ORDER — DARATUMUMAB CHEMO INJECTION 400 MG/20ML
16.0000 mg/kg | Freq: Once | INTRAVENOUS | Status: AC
  Administered 2016-12-08: 900 mg via INTRAVENOUS
  Filled 2016-12-08: qty 40

## 2016-12-08 MED ORDER — HEPARIN SOD (PORK) LOCK FLUSH 100 UNIT/ML IV SOLN
500.0000 [IU] | Freq: Once | INTRAVENOUS | Status: AC | PRN
  Administered 2016-12-08: 500 [IU]
  Filled 2016-12-08: qty 5

## 2016-12-08 MED ORDER — DEXAMETHASONE 4 MG PO TABS
40.0000 mg | ORAL_TABLET | Freq: Once | ORAL | Status: AC
  Administered 2016-12-08: 40 mg via ORAL

## 2016-12-08 MED ORDER — ACETAMINOPHEN 325 MG PO TABS
ORAL_TABLET | ORAL | Status: AC
  Filled 2016-12-08: qty 2

## 2016-12-08 NOTE — Progress Notes (Signed)
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      10/10/2014 Miscellaneous    Peripheral blood chemistry and leukocytosis with total white count 78K, comprised of large plasma cells and his normocytic anemia. There is a myeloid left shift with previous surgical radium blasts. Flow cytometry showed 64% plasma cells      10/10/2014 Bone Marrow Biopsy    Markedly hypercellular marrow (95%), Atypical plasma cells comprise 57% of the cellularity. There was diminished multilineage in hematopoiesis with adequate maturation. Breasts less than 1%), no overt dysplasia of the myeloid or erythroid lineages.       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      05/07/2015 - 05/12/2015 Hospital Admission    [facility], [facility] ID workup was negative, EGD showed evidence of gastritis and duodenitis, no H. pylori or CMV.      05/20/2015 - 05/24/2015  Hospital Admission    [facility]      07/11/2015 Bone Marrow Biopsy    Post transplant 100 a bone marrow biopsy showed hypocellular marrow, 20%, no increase in plasma cells (2%) or other abnormalities.      08/22/2015 - 01/02/2016 Chemotherapy    MaintenanceCyborD (cytoxan 3014mm2 iv, bortezomib 1.5 mg/m, dexamethasone 40 mg, every 2 weeks, changed to Velcade maintenance after 4 months treatment      01/16/2016 - 04/19/2016 Chemotherapy    Maintenance Velcade 1.3 mg/m every 2 weeks      02/22/2016 Pathology Results    BONE MARROW: Diagnosis Bone Marrow, Aspirate,Biopsy, and Clot, right iliac - NORMOCELLULAR BONE MARROW FOR AGE WITH TRILINEAGE HEMATOPOIESIS. - PLASMACYTOSIS (PLASMA CELLS 12%). - SEE COMMENT. PERIPHERAL BLOOD: - OCCASIONAL CIRCULATING PLASMA CELLS.      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 Velcade 1.49m8m2 and dexa 71m28mekly 2. Daratumumab weekly started on 04/18/2016, changed to every 2 weeks on 06/25/2016 and changed to every 4 weeks after 10/13/2016, per protocol  3. zometa every 4 weeks started on 11/27/2015  INTERVAL HISTORY: Sheryl [name] today for follow-up as scheduled.  She has had a sore throat for  2 weeks, developed a productive cough with yellow sputum over the last week.  She notes people are coughing in her home.  She denies fever, chills, congestion, chest pain, or shortness of breath.  She has minimal fatigue, continues to work, has good appetite.  She has been bloated since her recent colonoscopy, denies blood in stool, BMs normal.  She has intermittent tingling to arms and  legs has not since chemotherapy, none in fingers or toes, does not impair function, and stable overall.  She has an appointment with ID in 01/2017 and with Dr. Norma Fredrickson at [facility] on 12/22/2016.   REVIEW OF SYSTEMS:   Constitutional: Denies fevers, chills or abnormal weight loss (+) mild fatigue, working now Eyes: Denies blurriness of vision Ears, nose, mouth, throat, and face: Denies mucositis or nasal congestion (+) sore throat times 2 weeks (+)  Respiratory: Denies dyspnea or wheezes (+) productive cough times 1 week, yellow phlegm Cardiovascular: Denies palpitation, chest discomfort or lower extremity swelling Gastrointestinal:  Denies nausea, vomiting, constipation, diarrhea, heartburn or change in bowel habits.  Denies blood in stool or rectal bleeding (+) bloating since colonoscopy on 11/26/2016 Skin: Denies abnormal skin rashes Lymphatics: Denies new lymphadenopathy or easy bruising Neurological:Denies new weaknesses (+) intermittent tingling to arms and left, none in toes or fingers, stable since chemotherapy Behavioral/Psych: Mood is stable, no new changes  All other systems were reviewed with the patient and are negative.  MEDICAL HISTORY:  Past Medical History:  Diagnosis Date  . Chills with fever    intermittently since d/c from hospital  . Dysuria-frequency syndrome    w/ pink urine  . GERD (gastroesophageal reflux disease)   . Hepatitis   . History of positive PPD    DX 2011--  CXR DONE NO EVIDENCE  . History of ureter stent   . Hydronephrosis, right   . Neuromuscular disorder (H[facility])    legs numb intermittently  . 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 Pneumonia   . Right ureteral stone   . Urosepsis 8/14   admitted to [facility]    SURGICAL HISTORY: Past Surgical History:  Procedure Laterality Date  . LIVER BIOPSY    . OTHER SURGICAL HISTORY Right    removal of ovarian cyst  . removal of uterine cyst     years ago  . RIGHT VATS W/ DRAINAGE PEURAL EFFUSION AND BX'S   10-30-2008    I have reviewed the social history and family history with the patient and they are unchanged from previous note.  ALLERGIES:  has No Known Allergies.  MEDICATIONS:  Current Outpatient Medications  Medication Sig Dispense Refill  . acetaminophen (TYLENOL) 325 MG tablet Take 2 tablets (650 mg total) by mouth every 6 (six) hours as needed for mild pain (or Fever >/= 101). 30 tablet 0  . acyclovir (ZOVIRAX) 800 MG tablet Take 1 tablet (800 mg total) by mouth 2 (two) times daily. 60 tablet 2  . dexamethasone (DECADRON) 4 MG tablet Take 10 tablets (40 mg total) by mouth once a week. 40 tablet 2  . metFORMIN (GLUCOPHAGE) 500 MG tablet Take 1 tablet (500 mg total) by mouth 2 (two) times daily with a meal. 60 tablet 2  . omeprazole (PRILOSEC) 20 MG capsule Take 1 capsule (20 mg total) by mouth daily. 30 capsule 5  . ondansetron (ZOFRAN) 8 MG tablet Take 1 tablet (8 mg total) by mouth every 8 (eight) hours as needed for nausea or vomiting. 30 tablet 1  . polyethylene  glycol (MIRALAX / GLYCOLAX) packet Take 17 g by mouth daily as needed.    . promethazine (PHENERGAN) 25 MG tablet Take 1 tablet (25 mg total) by mouth every 6 (six) hours as needed for nausea or vomiting. 30 tablet 0   No current facility-administered medications for this visit.    Facility-Administered Medications Ordered in Other Visits  Medication Dose Route Frequency Provider Last Rate Last Dose  . sodium chloride 0.9 % injection 10 mL  10 mL Intravenous PRN Truitt Merle, MD   10 mL at 12/11/15 1532  . sodium chloride 0.9 % injection 10 mL  10 mL Intravenous PRN Truitt Merle, MD   10 mL at 06/11/16 1505  . sodium chloride flush (NS) 0.9 % injection 10 mL  10 mL Intravenous PRN Truitt Merle, MD   10 mL at 10/09/15 1717    PHYSICAL EXAMINATION: ECOG PERFORMANCE STATUS: 1 - Symptomatic but completely ambulatory  Vitals:   12/08/16 0913  BP: 110/76  Pulse: 91  Resp: 18  Temp: 98 F (36.7 C)  SpO2: 100%   Filed Weights    12/08/16 0913  Weight: 133 lb 6.4 oz (60.5 kg)    GENERAL:alert, no distress and comfortable SKIN: skin color, texture, turgor are normal, no rashes or significant lesions EYES: normal, Conjunctiva are pink and non-injected, sclera clear OROPHARYNX:no exudate, no erythema and lips, buccal mucosa, and tongue normal  NECK: supple, thyroid normal size, non-tender, without nodularity LYMPH:  no palpable cervical or supraclavicular lymphadenopathy  LUNGS: clear to auscultation bilaterally with normal breathing effort HEART: regular rate & rhythm and no murmurs and no lower extremity edema ABDOMEN:abdomen soft, non-tender and normal bowel sounds.  No hepatomegaly Musculoskeletal:no cyanosis of digits and no clubbing  NEURO: alert & oriented x 3 with fluent speech, no focal motor or sensory deficits per tuning fork exam  LABORATORY DATA:  I have reviewed the data as listed CBC Latest Ref Rng & Units 12/08/2016 11/24/2016 11/10/2016  WBC 3.9 - 10.3 10e3/uL 5.8 5.3 5.3  Hemoglobin 11.6 - 15.9 g/dL 9.7(L) 10.2(L) 9.8(L)  Hematocrit 34.8 - 46.6 % 31.6(L) 33.1(L) 31.8(L)  Platelets 145 - 400 10e3/uL 191 167 165    CMP Latest Ref Rng & Units 12/08/2016 11/24/2016 11/24/2016  Glucose 70 - 140 mg/dl 113 94 -  BUN 7.0 - 26.0 mg/dL 12.0 14.1 -  Creatinine 0.6 - 1.1 mg/dL 0.7 0.7 -  Sodium 136 - 145 mEq/L 142 141 -  Potassium 3.5 - 5.1 mEq/L 3.5 4.1 -  Chloride 101 - 111 mmol/L - - -  CO2 22 - 29 mEq/L 24 24 -  Calcium 8.4 - 10.4 mg/dL 8.9 8.9 -  Total Protein 6.4 - 8.3 g/dL 6.3(L) 6.4 6.2  Total Bilirubin 0.20 - 1.20 mg/dL <0.22 0.26 -  Alkaline Phos 40 - 150 U/L 109 83 -  AST 5 - 34 U/L 23 29 -  ALT 0 - 55 U/L 13 11 -    SPEP M-protein  09/15/2014: 4.2 10/08/14: 4.6 12/08/2014: 0.2 02/15/2015: not sufficient sample for test   09/11/2015: not observed 10/09/2015: not det   11/06/2015: not det  12/25/2015: not det  02/13/2016: 0.3 03/12/2016: 0.8 04/09/2016: 0.6 05/08/16: 0.1 06/04/16:  0.1 07/02/2016: 0.2 07/09/2016: 0.2 (dara specific IFE negative) 08/05/16: Not observed  09/01/16: Not observed 09/29/16: 0.1(dara specific IFE negative) 10/27/16: Not observed 11/24/16: Not observed   IgG mg/dl 11/03/2014: 3150  12/08/2014:  759 02/14/2014: 860 09/11/2015: 1347 10/09/2015: 1457 11/06/2015: 1504 12/25/2015: 1400 02/13/2016: 2482  03/12/2016: 1860 04/09/2016: 1620 05/08/16: 749 06/04/16: 751 5/30/20187: 796 07/09/2016: 701 08/05/16: 678 09/01/16: 650 09/29/16: 727 10/27/16: 634 11/24/16: 685    Kappa/lambda light chains levels and ratio 11/03/14: 0.75, 152, 0.00 12/22/2014: 1.10, 2.60, 0.42 02/15/2015: 9.59,14.35, 0.67 09/11/2015: 2.44, 2.72, 0.90 10/09/2015: 3.09, 3.49, 0.89 11/06/2015: 2.30, 2.62, 0.88 12/25/2015: 2.51, 3.0, 0.84 02/13/2016: 2.64, 15.3, 0.17 03/12/2016: 2.27, 45.5, 0.05 04/09/2016: 3.33, 45.5, 0.07 05/08/2016: 0.49, 1.21, 0.40 06/04/2016: 0.83, 1.11, 0.75 07/02/16: 0.51, 1.43, 0.36 07/23/16: 0.68, 1.02, 0.67 08/05/16: 0.62, 1.09, 0.57 09/01/16: 0.80, 1.21, 0.66 09/29/16: 0.44, 1.07, 0.41 10/27/16: 0.63, 1.44, 0.44 11/24/16: 0.83, 2.13, 0.39  24 h urine UPEP/IFE and light chain: 11/03/2014: IFE showed a monoclonal IgG heavy chain with associated lambda light chain. M protein was Undetectable   PATHOLOGY REPORT  Bone Marrow (BM) and Peripheral Blood (PB) FINAL PATHOLOGIC DIAGNOSIS  BONE MARROW: 07/11/2015 Hypocellular marrow (20%) with no increase in plasma cells (2%). See comment.  PERIPHERAL BLOOD: Mild anemia. No circulating plasma cells identified. See comment and CBC data.  BONE MARROW: 02/22/2016 Diagnosis Bone Marrow, Aspirate,Biopsy, and Clot, right iliac - NORMOCELLULAR BONE MARROW FOR AGE WITH TRILINEAGE HEMATOPOIESIS. - PLASMACYTOSIS (PLASMA CELLS 12%). - SEE COMMENT. PERIPHERAL BLOOD: - OCCASIONAL CIRCULATING PLASMA CELLS.  RADIOGRAPHIC STUDIES: I have personally reviewed the radiological images as listed and agreed with the findings in  the report.      CXR 05/08/2016 IMPRESSION:  No acute disease.  CT Biopsy 02/22/16 IMPRESSION: 1. Technically successful CT guided right iliac bone core and aspiration biopsy.  ASSESSMENT & PLAN: 46 y.o. [place] woman, presented with anemia and leokocytosis   1. 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 Chronic Hep B 4. GERD, recent GI bleeding and nausea 5. Constipation  Sheryl [name] appears stable today, tolerating weekly velcade/dex and q4 weeks darzalex well overall. She has productive cough x1 week and sore throat x2 weeks; this may likely be viral but she has had multiple URI in the past with hospitalization for healthcare-acquired pneumonia in 2018. I prescribed zithromax 500 mg daily x5 days that she will begin today. She has had a flu shot this year. Tingling to arms and legs has been present since chemotherapy and has not worsened or impaired function, will monitor closely. Colonoscopy on 11/26/16 per Dr. Ardis Hughs notable for a polyp in the transverse colon, revealed to be tubular adenoma and negative for high grade dysplasia or malignancy. She has not had f/u with ID for Hep B but is scheduled in 01/2017; has been off medication since July 2018. She will keep that appointment. Labs and physical exam unremarkable, she is doing well overall. CBC is stable, BG is controlled on metformin. We discussed her labs indicate she continues to be in remission, but a repeat bone marrow biopsy will confirm that. She will see Dr. Norma Fredrickson at [facility] 12/22/16. We will continue weekly velcade/dex and q4 weeks daratumumab; may be able to deescalate treatment based on his recommendations. If she gets velcade at [facility] on 11/19, we will cancel her 11/20 infusion here, she will call. She will return for weekly infusions, labs q2 weeks, and f/u with Dr. Burr Medico in 4 weeks prior to next cycle daratumumab.  PLAN: -Prescription for Zithromax 500 mg 1 tablet daily x5  days, begin today -Labs reviewed, proceed with next cycle weekly velcade/dexamethasone and daratumumab today.  -She needs to refill home supply dex today, she will get her 40 mg weekly dose in  infusion today -Continue weekly velcade/dex, q4 weeks daratumumab (next due 12/3), and monthly zometa (next due 11/26) -F/u with Dr. Burr Medico 12/3 prior to next dara -Dr. Norma Fredrickson f/u at [facility] 11/18 -ID f/u 01/2017  All questions were answered. The patient knows to call the clinic with any problems, questions or concerns. No barriers to learning was detected.     Alla Feeling, NP 12/08/16   I have reviewed the above documentation for accuracy and completeness, and I agree with the above.  Ms Albin is doing well overall.  She has signs of upper respiratory infection, due to her compromised immune system, I recommend a course of azithromycin.  We reviewed her lab, her M protein has been negative, I chance level of normal, I think she is still in remission.  She is scheduled to see Dr. Norma Fredrickson this month at [facility].  She will also follow-up with her hepatologist at the [facility] next months.  She is off treatment for hepatitis B, both patient and my nurse have called her hepatologist at the [facility] several times for refill, but she has not received refills yet.  Plan to continue current treatment.   I spent 20 minutes counseling the patient face to face. The total time spent in the appointment was 25 minutes and more than 50% was on counseling and review of test results

## 2016-12-08 NOTE — Patient Instructions (Signed)
[place] Discharge Instructions for Patients Receiving Chemotherapy  Today you received the following chemotherapy agents: Darzalex and Velcade.  To help prevent nausea and vomiting after your treatment, we encourage you to take your nausea medication as prescribed.   If you develop nausea and vomiting that is not controlled by your nausea medication, call the clinic.   BELOW ARE SYMPTOMS THAT SHOULD BE REPORTED IMMEDIATELY:  *FEVER GREATER THAN 100.5 F  *CHILLS WITH OR WITHOUT FEVER  NAUSEA AND VOMITING THAT IS NOT CONTROLLED WITH YOUR NAUSEA MEDICATION  *UNUSUAL SHORTNESS OF BREATH  *UNUSUAL BRUISING OR BLEEDING  TENDERNESS IN MOUTH AND THROAT WITH OR WITHOUT PRESENCE OF ULCERS  *URINARY PROBLEMS  *BOWEL PROBLEMS  UNUSUAL RASH Items with * indicate a potential emergency and should be followed up as soon as possible.  Feel free to call the clinic should you have any questions or concerns. The clinic phone number is [phone].  Please show the [place] at check-in to the Emergency Department and triage nurse.

## 2016-12-15 ENCOUNTER — Other Ambulatory Visit: Payer: Self-pay | Admitting: Medical Oncology

## 2016-12-15 ENCOUNTER — Ambulatory Visit (HOSPITAL_BASED_OUTPATIENT_CLINIC_OR_DEPARTMENT_OTHER): Payer: Medicaid Other

## 2016-12-15 ENCOUNTER — Telehealth: Payer: Self-pay | Admitting: Nurse Practitioner

## 2016-12-15 VITALS — BP 102/58 | HR 92 | Temp 98.4°F | Resp 18

## 2016-12-15 DIAGNOSIS — Z5111 Encounter for antineoplastic chemotherapy: Secondary | ICD-10-CM

## 2016-12-15 DIAGNOSIS — C9010 Plasma cell leukemia not having achieved remission: Secondary | ICD-10-CM | POA: Diagnosis not present

## 2016-12-15 DIAGNOSIS — Z5112 Encounter for antineoplastic immunotherapy: Secondary | ICD-10-CM | POA: Diagnosis present

## 2016-12-15 DIAGNOSIS — R112 Nausea with vomiting, unspecified: Secondary | ICD-10-CM

## 2016-12-15 DIAGNOSIS — C9012 Plasma cell leukemia in relapse: Secondary | ICD-10-CM

## 2016-12-15 LAB — CBC WITH DIFFERENTIAL/PLATELET
BASO%: 0 % (ref 0.0–2.0)
Basophils Absolute: 0 10*3/uL (ref 0.0–0.1)
EOS%: 0 % (ref 0.0–7.0)
Eosinophils Absolute: 0 10*3/uL (ref 0.0–0.5)
HCT: 35.3 % (ref 34.8–46.6)
HGB: 10.9 g/dL — ABNORMAL LOW (ref 11.6–15.9)
LYMPH%: 7.7 % — AB (ref 14.0–49.7)
MCH: 27.3 pg (ref 25.1–34.0)
MCHC: 30.9 g/dL — AB (ref 31.5–36.0)
MCV: 88.3 fL (ref 79.5–101.0)
MONO#: 0 10*3/uL — AB (ref 0.1–0.9)
MONO%: 0.4 % (ref 0.0–14.0)
NEUT%: 91.9 % — AB (ref 38.4–76.8)
NEUTROS ABS: 6.8 10*3/uL — AB (ref 1.5–6.5)
PLATELETS: 196 10*3/uL (ref 145–400)
RBC: 4 10*6/uL (ref 3.70–5.45)
RDW: 15.2 % — ABNORMAL HIGH (ref 11.2–14.5)
WBC: 7.4 10*3/uL (ref 3.9–10.3)
lymph#: 0.6 10*3/uL — ABNORMAL LOW (ref 0.9–3.3)

## 2016-12-15 LAB — COMPREHENSIVE METABOLIC PANEL
ALT: 22 U/L (ref 0–55)
AST: 45 U/L — AB (ref 5–34)
Albumin: 3.6 g/dL (ref 3.5–5.0)
Alkaline Phosphatase: 146 U/L (ref 40–150)
Anion Gap: 11 mEq/L (ref 3–11)
BUN: 15.2 mg/dL (ref 7.0–26.0)
CHLORIDE: 107 meq/L (ref 98–109)
CO2: 24 mEq/L (ref 22–29)
CREATININE: 0.8 mg/dL (ref 0.6–1.1)
Calcium: 9.7 mg/dL (ref 8.4–10.4)
EGFR: >60 mL/min/{1.73_m2} (ref >60)
GLUCOSE: 127 mg/dL (ref 70–140)
Potassium: 3.7 mEq/L (ref 3.5–5.1)
SODIUM: 142 meq/L (ref 136–145)
TOTAL PROTEIN: 7.4 g/dL (ref 6.4–8.3)

## 2016-12-15 MED ORDER — PROCHLORPERAZINE MALEATE 10 MG PO TABS
ORAL_TABLET | ORAL | Status: AC
  Filled 2016-12-15: qty 1

## 2016-12-15 MED ORDER — PROCHLORPERAZINE MALEATE 10 MG PO TABS
10.0000 mg | ORAL_TABLET | Freq: Once | ORAL | Status: AC
  Administered 2016-12-15: 10 mg via ORAL

## 2016-12-15 MED ORDER — PROCHLORPERAZINE EDISYLATE 5 MG/ML IJ SOLN: 10.0000 mg | Freq: Once | INTRAMUSCULAR | Status: DC

## 2016-12-15 MED ORDER — BORTEZOMIB CHEMO SQ INJECTION 3.5 MG (2.5MG/ML)
1.5000 mg/m2 | Freq: Once | INTRAMUSCULAR | Status: AC
Start: 2016-12-15 — End: 2016-12-15
  Administered 2016-12-15: 2.5 mg via SUBCUTANEOUS
  Filled 2016-12-15: qty 2.5

## 2016-12-15 MED ORDER — SODIUM CHLORIDE 0.9 % IV SOLN: Freq: Once | INTRAVENOUS | Status: DC

## 2016-12-15 NOTE — Patient Instructions (Signed)
[facility] Discharge Instructions for Patients Receiving Chemotherapy  Today you received the following chemotherapy agents Velcade To help prevent nausea and vomiting after your treatment, we encourage you to take your nausea medication as prescribed.   If you develop nausea and vomiting that is not controlled by your nausea medication, call the clinic.   BELOW ARE SYMPTOMS THAT SHOULD BE REPORTED IMMEDIATELY:  *FEVER GREATER THAN 100.5 F  *CHILLS WITH OR WITHOUT FEVER  NAUSEA AND VOMITING THAT IS NOT CONTROLLED WITH YOUR NAUSEA MEDICATION  *UNUSUAL SHORTNESS OF BREATH  *UNUSUAL BRUISING OR BLEEDING  TENDERNESS IN MOUTH AND THROAT WITH OR WITHOUT PRESENCE OF ULCERS  *URINARY PROBLEMS  *BOWEL PROBLEMS  UNUSUAL RASH Items with * indicate a potential emergency and should be followed up as soon as possible.  Feel free to call the clinic should you have any questions or concerns. The clinic phone number is [phone].  Please show the CHEMO ALERT CARD at check-in to the Emergency Department and triage nurse.   

## 2016-12-15 NOTE — Telephone Encounter (Signed)
Patient wanted to see if she could get earlier appointments

## 2016-12-16 ENCOUNTER — Telehealth: Payer: Self-pay | Admitting: Hematology

## 2016-12-16 NOTE — Telephone Encounter (Signed)
Mailed patient calendar of upcoming appointments.  °

## 2016-12-22 ENCOUNTER — Ambulatory Visit: Payer: Medicaid Other

## 2016-12-22 ENCOUNTER — Other Ambulatory Visit: Payer: Medicaid Other

## 2016-12-23 ENCOUNTER — Other Ambulatory Visit: Payer: Medicaid Other

## 2016-12-23 ENCOUNTER — Ambulatory Visit: Payer: Medicaid Other

## 2016-12-23 ENCOUNTER — Ambulatory Visit (HOSPITAL_BASED_OUTPATIENT_CLINIC_OR_DEPARTMENT_OTHER): Payer: Medicaid Other

## 2016-12-23 ENCOUNTER — Other Ambulatory Visit (HOSPITAL_BASED_OUTPATIENT_CLINIC_OR_DEPARTMENT_OTHER): Payer: Medicaid Other

## 2016-12-23 VITALS — BP 107/82 | HR 81 | Temp 98.4°F | Resp 18 | Wt 134.5 lb

## 2016-12-23 DIAGNOSIS — Z95828 Presence of other vascular implants and grafts: Secondary | ICD-10-CM

## 2016-12-23 DIAGNOSIS — C9010 Plasma cell leukemia not having achieved remission: Secondary | ICD-10-CM

## 2016-12-23 DIAGNOSIS — C9012 Plasma cell leukemia in relapse: Secondary | ICD-10-CM

## 2016-12-23 DIAGNOSIS — Z5112 Encounter for antineoplastic immunotherapy: Secondary | ICD-10-CM

## 2016-12-23 DIAGNOSIS — C9011 Plasma cell leukemia in remission: Secondary | ICD-10-CM

## 2016-12-23 LAB — CBC WITH DIFFERENTIAL/PLATELET
BASO%: 0.2 % (ref 0.0–2.0)
Basophils Absolute: 0 10*3/uL (ref 0.0–0.1)
EOS ABS: 0 10*3/uL (ref 0.0–0.5)
EOS%: 0.4 % (ref 0.0–7.0)
HCT: 32.1 % — ABNORMAL LOW (ref 34.8–46.6)
HEMOGLOBIN: 10.3 g/dL — AB (ref 11.6–15.9)
LYMPH%: 15.9 % (ref 14.0–49.7)
MCH: 27.3 pg (ref 25.1–34.0)
MCHC: 32.1 g/dL (ref 31.5–36.0)
MCV: 85.2 fL (ref 79.5–101.0)
MONO#: 0.4 10*3/uL (ref 0.1–0.9)
MONO%: 8.1 % (ref 0.0–14.0)
NEUT%: 75.4 % (ref 38.4–76.8)
NEUTROS ABS: 3.7 10*3/uL (ref 1.5–6.5)
Platelets: 194 10*3/uL (ref 145–400)
RBC: 3.77 10*6/uL (ref 3.70–5.45)
RDW: 16.5 % — AB (ref 11.2–14.5)
WBC: 5 10*3/uL (ref 3.9–10.3)
lymph#: 0.8 10*3/uL — ABNORMAL LOW (ref 0.9–3.3)

## 2016-12-23 LAB — COMPREHENSIVE METABOLIC PANEL
ALT: 10 U/L (ref 0–55)
ANION GAP: 7 meq/L (ref 3–11)
AST: 23 U/L (ref 5–34)
Albumin: 3.4 g/dL — ABNORMAL LOW (ref 3.5–5.0)
Alkaline Phosphatase: 94 U/L (ref 40–150)
BILIRUBIN TOTAL: 0.32 mg/dL (ref 0.20–1.20)
BUN: 12.9 mg/dL (ref 7.0–26.0)
CALCIUM: 9.8 mg/dL (ref 8.4–10.4)
CHLORIDE: 107 meq/L (ref 98–109)
CO2: 28 mEq/L (ref 22–29)
CREATININE: 0.7 mg/dL (ref 0.6–1.1)
EGFR: >60 mL/min/{1.73_m2} (ref >60)
Glucose: 85 mg/dl (ref 70–140)
Potassium: 4 mEq/L (ref 3.5–5.1)
Sodium: 141 mEq/L (ref 136–145)
Total Protein: 6.6 g/dL (ref 6.4–8.3)

## 2016-12-23 MED ORDER — SODIUM CHLORIDE 0.9 % IJ SOLN
10.0000 mL | INTRAMUSCULAR | Status: DC | PRN
  Administered 2016-12-23: 10 mL via INTRAVENOUS
  Filled 2016-12-23: qty 10

## 2016-12-23 MED ORDER — PROCHLORPERAZINE EDISYLATE 5 MG/ML IJ SOLN
10.0000 mg | Freq: Once | INTRAMUSCULAR | Status: AC
  Administered 2016-12-23: 10 mg via INTRAVENOUS

## 2016-12-23 MED ORDER — PROCHLORPERAZINE EDISYLATE 5 MG/ML IJ SOLN
INTRAMUSCULAR | Status: AC
  Filled 2016-12-23: qty 2

## 2016-12-23 MED ORDER — SODIUM CHLORIDE 0.9 % IV SOLN
Freq: Once | INTRAVENOUS | Status: AC
  Administered 2016-12-23: 10:00:00 via INTRAVENOUS

## 2016-12-23 MED ORDER — PROCHLORPERAZINE MALEATE 10 MG PO TABS
ORAL_TABLET | ORAL | Status: AC
  Filled 2016-12-23: qty 1

## 2016-12-23 MED ORDER — BORTEZOMIB CHEMO SQ INJECTION 3.5 MG (2.5MG/ML)
1.5000 mg/m2 | Freq: Once | INTRAMUSCULAR | Status: AC
  Administered 2016-12-23: 2.5 mg via SUBCUTANEOUS
  Filled 2016-12-23: qty 2.5

## 2016-12-23 MED ORDER — HEPARIN SOD (PORK) LOCK FLUSH 100 UNIT/ML IV SOLN
500.0000 [IU] | Freq: Once | INTRAVENOUS | Status: AC | PRN
  Administered 2016-12-23: 500 [IU]
  Filled 2016-12-23: qty 5

## 2016-12-23 MED ORDER — SODIUM CHLORIDE 0.9% FLUSH
10.0000 mL | INTRAVENOUS | Status: DC | PRN
  Administered 2016-12-23: 10 mL
  Filled 2016-12-23: qty 10

## 2016-12-23 NOTE — Patient Instructions (Signed)
[facility] Discharge Instructions for Patients Receiving Chemotherapy  Today you received the following chemotherapy agents:  Velcade (bortezomib)  To help prevent nausea and vomiting after your treatment, we encourage you to take your nausea medication as prescribed.   If you develop nausea and vomiting that is not controlled by your nausea medication, call the clinic.   BELOW ARE SYMPTOMS THAT SHOULD BE REPORTED IMMEDIATELY:  *FEVER GREATER THAN 100.5 F  *CHILLS WITH OR WITHOUT FEVER  NAUSEA AND VOMITING THAT IS NOT CONTROLLED WITH YOUR NAUSEA MEDICATION  *UNUSUAL SHORTNESS OF BREATH  *UNUSUAL BRUISING OR BLEEDING  TENDERNESS IN MOUTH AND THROAT WITH OR WITHOUT PRESENCE OF ULCERS  *URINARY PROBLEMS  *BOWEL PROBLEMS  UNUSUAL RASH Items with * indicate a potential emergency and should be followed up as soon as possible.  Feel free to call the clinic should you have any questions or concerns. The clinic phone number is [phone].  Please show the CHEMO ALERT CARD at check-in to the Emergency Department and triage nurse.   

## 2016-12-24 LAB — PROTEIN ELECTROPHORESIS, SERUM
A/G RATIO SPE: 1.1 (ref 0.7–1.7)
ALBUMIN: 3.3 g/dL (ref 2.9–4.4)
Alpha 1: 0.2 g/dL (ref 0.0–0.4)
Alpha 2: 1 g/dL (ref 0.4–1.0)
BETA: 1 g/dL (ref 0.7–1.3)
Gamma Globulin: 0.7 g/dL (ref 0.4–1.8)
Globulin, Total: 2.9 g/dL (ref 2.2–3.9)
M-Spike, %: 0.1 g/dL — ABNORMAL HIGH
Total Protein: 6.2 g/dL (ref 6.0–8.5)

## 2016-12-24 LAB — KAPPA/LAMBDA LIGHT CHAINS
Ig Kappa Free Light Chain: 6.9 mg/L (ref 3.3–19.4)
Ig Lambda Free Light Chain: 23.1 mg/L (ref 5.7–26.3)
KAPPA/LAMBDA FLC RATIO: 0.3 (ref 0.26–1.65)

## 2016-12-29 ENCOUNTER — Ambulatory Visit (HOSPITAL_BASED_OUTPATIENT_CLINIC_OR_DEPARTMENT_OTHER): Payer: Medicaid Other

## 2016-12-29 VITALS — BP 107/72 | HR 98 | Temp 98.2°F | Resp 18

## 2016-12-29 DIAGNOSIS — Z95828 Presence of other vascular implants and grafts: Secondary | ICD-10-CM

## 2016-12-29 DIAGNOSIS — C9012 Plasma cell leukemia in relapse: Secondary | ICD-10-CM

## 2016-12-29 DIAGNOSIS — Z5112 Encounter for antineoplastic immunotherapy: Secondary | ICD-10-CM | POA: Diagnosis not present

## 2016-12-29 DIAGNOSIS — C9010 Plasma cell leukemia not having achieved remission: Secondary | ICD-10-CM

## 2016-12-29 MED ORDER — PROCHLORPERAZINE EDISYLATE 5 MG/ML IJ SOLN
INTRAMUSCULAR | Status: AC
  Filled 2016-12-29: qty 2

## 2016-12-29 MED ORDER — BORTEZOMIB CHEMO SQ INJECTION 3.5 MG (2.5MG/ML)
1.5000 mg/m2 | Freq: Once | INTRAMUSCULAR | Status: AC
  Administered 2016-12-29: 2.5 mg via SUBCUTANEOUS
  Filled 2016-12-29: qty 2.5

## 2016-12-29 MED ORDER — ZOLEDRONIC ACID 4 MG/100ML IV SOLN
4.0000 mg | Freq: Once | INTRAVENOUS | Status: AC
  Administered 2016-12-29: 4 mg via INTRAVENOUS
  Filled 2016-12-29: qty 100

## 2016-12-29 MED ORDER — HEPARIN SOD (PORK) LOCK FLUSH 100 UNIT/ML IV SOLN
500.0000 [IU] | Freq: Once | INTRAVENOUS | Status: AC | PRN
  Administered 2016-12-29: 500 [IU]
  Filled 2016-12-29: qty 5

## 2016-12-29 MED ORDER — PROCHLORPERAZINE EDISYLATE 5 MG/ML IJ SOLN
10.0000 mg | Freq: Once | INTRAMUSCULAR | Status: AC
Start: 2016-12-29 — End: 2016-12-29
  Administered 2016-12-29: 10 mg via INTRAVENOUS

## 2016-12-29 MED ORDER — SODIUM CHLORIDE 0.9% FLUSH
10.0000 mL | INTRAVENOUS | Status: DC | PRN
  Administered 2016-12-29: 10 mL
  Filled 2016-12-29: qty 10

## 2016-12-29 NOTE — Patient Instructions (Signed)
[place] Discharge Instructions for Patients Receiving Chemotherapy  Today you received the following agents: bortezomib (Velcade) and zolendric acid (Zometa)  To help prevent nausea and vomiting after your treatment, we encourage you to take your nausea medication as directed.    If you develop nausea and vomiting that is not controlled by your nausea medication, call the clinic.   BELOW ARE SYMPTOMS THAT SHOULD BE REPORTED IMMEDIATELY:  *FEVER GREATER THAN 100.5 F  *CHILLS WITH OR WITHOUT FEVER  NAUSEA AND VOMITING THAT IS NOT CONTROLLED WITH YOUR NAUSEA MEDICATION  *UNUSUAL SHORTNESS OF BREATH  *UNUSUAL BRUISING OR BLEEDING  TENDERNESS IN MOUTH AND THROAT WITH OR WITHOUT PRESENCE OF ULCERS  *URINARY PROBLEMS  *BOWEL PROBLEMS  UNUSUAL RASH Items with * indicate a potential emergency and should be followed up as soon as possible.  Feel free to call the clinic should you have any questions or concerns. The clinic phone number is [phone].  Please show the [place] at check-in to the Emergency Department and triage nurse.

## 2016-12-30 LAB — IFE, DARA-SPECIFIC, SERUM
IGG (IMMUNOGLOBIN G), SERUM: 714 mg/dL (ref 700–1600)
IGM (IMMUNOGLOBIN M), SRM: 29 mg/dL (ref 26–217)
IgA, Qn, Serum: 34 mg/dL — ABNORMAL LOW (ref 87–352)

## 2016-12-31 ENCOUNTER — Other Ambulatory Visit: Payer: Self-pay | Admitting: *Deleted

## 2016-12-31 DIAGNOSIS — C9010 Plasma cell leukemia not having achieved remission: Secondary | ICD-10-CM

## 2016-12-31 MED ORDER — ACYCLOVIR 800 MG PO TABS: 800.0000 mg | ORAL_TABLET | Freq: Two times a day (BID) | ORAL | 2 refills | Status: DC

## 2017-01-02 NOTE — Progress Notes (Signed)
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      10/10/2014 Miscellaneous    Peripheral blood chemistry and leukocytosis with total white count 78K, comprised of large plasma cells and his normocytic anemia. There is a myeloid left shift with previous surgical radium blasts. Flow cytometry showed 64% plasma cells      10/10/2014 Bone Marrow Biopsy    Markedly hypercellular marrow (95%), Atypical plasma cells comprise 57% of the cellularity. There was diminished multilineage in hematopoiesis with adequate maturation. Breasts less than 1%), no overt dysplasia of the myeloid or erythroid lineages.       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      05/07/2015 - 05/12/2015 Hospital Admission    [facility], [facility] ID workup was negative, EGD showed evidence of gastritis and duodenitis, no H. pylori or  CMV.      05/20/2015 - 05/24/2015 Hospital Admission    [facility]      07/11/2015 Bone Marrow Biopsy    Post transplant 100 a bone marrow biopsy showed hypocellular marrow, 20%, no increase in plasma cells (2%) or other abnormalities.      08/22/2015 - 01/02/2016 Chemotherapy    MaintenanceCyborD (cytoxan 3066mm2 iv, bortezomib 1.5 mg/m, dexamethasone 40 mg, every 2 weeks, changed to Velcade maintenance after 4 months treatment      01/16/2016 - 04/19/2016 Chemotherapy    Maintenance Velcade 1.3 mg/m every 2 weeks      02/22/2016 Pathology Results    BONE MARROW: Diagnosis Bone Marrow, Aspirate,Biopsy, and Clot, right iliac - NORMOCELLULAR BONE MARROW FOR AGE WITH TRILINEAGE HEMATOPOIESIS. - PLASMACYTOSIS (PLASMA CELLS 12%). - SEE COMMENT. PERIPHERAL BLOOD: - OCCASIONAL CIRCULATING PLASMA CELLS.      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 female is here because of recently diagnosed plasma cell leukemia. [facility] and is here to establish her local oncological care with [place] is accompanied  to the clinic by her daughter and interpreter.  She presented to our hospital lth with worsening dyspnea, fatigue, cough, subjective fevers and chills, and was admitted on 09/14/2014. She was found to have allergy WBC 54.1K, hemoglobin 8.1, plt 310, she was treated with symptom management, and was discharged home on 09/17/2014, with a plan to follow-up with hematology. She represents to  emergency room on 10/07/2014, was found to have white count of 78K, and worsening anemia with hemoglobin 6. She was seen by my partner Dr. Jonette Eva and plasma cell leukemia was suspected. She was transferred to [facility] for further leukemia work-up and treatment. Bone marrow biopsy was done, which confirmed plasma cell leukemia, and she started chemotherapy with Velcade, Cytoxan and dexamethasone. She received weekly dose twice, last dose on 10/20/2014. She was discharged to home afterwards. She had a mediport placed during her hospitalization.  She has moderate fatigue, low appetite, she lost about 13 lbs in the past few months. She has moderate abdominal pain, 5-6/10, persistent, she does not take any pain meds.   I reviewed her medical records extensively, and discussed her case with Dr. Jorje Guild and [place] program coordinator.  CURRENT THERAPY:   1. Velcade 1.65m/m2 and dexa 439mweekly 2. Daratumumab weekly started on 04/18/2016, changed to every 2 weeks on 06/25/2016 and changed to every 4 weeks after 10/13/2016, per protocol  3. zometa every 4 weeks started on 11/27/2015   INTERIM HISTORY:   Sheryl [name] is here for a follow up. She presents to the infusion room today accompanied by her interpreter.  She recently saw Dr. RoNorma Fredricksonnd she plans to proceed with transplant next year. She will see him back in 03/2017. She plan to have bone marrow biopsy this month. She notes last 3 days she has been having swelling in her ankles. This has resolved now. She wonders if this is related to her medication. She notes she does need a refill of dexamethasone. She takes dexamethasone 10 times weekly. Her sugar has been doing well, she checks it regularly and it is around 110. She will see her liver doctor this month. They contacted her daughter about her liver medication. She notes she will occasionally be bloated, she will have good BM     MEDICAL HISTORY:  Past Medical History:  Diagnosis Date  .  Chills with fever    intermittently since d/c from hospital  . Dysuria-frequency syndrome    w/ pink urine  . GERD (gastroesophageal reflux disease)   . Hepatitis   . History of positive PPD    DX 2011--  CXR DONE NO EVIDENCE  . History of ureter stent   . Hydronephrosis, right   . Neuromuscular disorder ([facility])    legs numb intermittently  . 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  . Pneumonia   . Right ureteral stone   . Urosepsis 8/14   [facility]    SURGICAL HISTORY: Past Surgical History:  Procedure Laterality Date  . CYSTOSCOPY W/ URETERAL STENT PLACEMENT Right 09/25/2012   Procedure: CYSTOSCOPY WITH RETROGRADE PYELOGRAM/URETERAL STENT PLACEMENT;  Surgeon: ThAlexis FrockMD;  Location: [facility];  Service: Urology;  Laterality: Right;  . CYSTOSCOPY WITH RETROGRADE PYELOGRAM, URETEROSCOPY AND STENT PLACEMENT Right 10/15/2012   Procedure: CYSTOSCOPY WITH RETROGRADE PYELOGRAM, URETEROSCOPY AND REMOVAL STENT WITH  STENT PLACEMENT;  Surgeon: ThAlexis FrockMD;  [facility] Service: [facility];  Laterality: Right;  . ESOPHAGOGASTRODUODENOSCOPY (EGD) WITH PROPOFOL N/A 11/16/2014   Procedure: ESOPHAGOGASTRODUODENOSCOPY (EGD)  WITH PROPOFOL;  Surgeon: Milus Banister, MD;  Location: [facility];  Service: Endoscopy;  Laterality: N/A;  . HOLMIUM LASER APPLICATION Right 09/27/537   Procedure: HOLMIUM LASER APPLICATION;  Surgeon: Alexis Frock, MD;  Location: [facility];  Service: Urology;  Laterality: Right;  . LIVER BIOPSY    . OTHER SURGICAL HISTORY Right    removal of ovarian cyst  . removal of uterine cyst     years ago  . RIGHT VATS W/ DRAINAGE PEURAL EFFUSION AND BX'S  10-30-2008    SOCIAL HISTORY: Social History   Socioeconomic History  . Marital status: Single    Spouse name: None  . Number of children: 3  . Years of education: None  . Highest education level: None  Social Needs  . Financial resource strain: None  . Food insecurity -  worry: None  . Food insecurity - inability: None  . Transportation needs - medical: None  . Transportation needs - non-medical: None  Occupational History  . None  Tobacco Use  . Smoking status: Never Smoker  . Smokeless tobacco: Never Used  Substance and Sexual Activity  . Alcohol use: No    Alcohol/week: 0.0 oz  . Drug use: No  . Sexual activity: Not Currently    Birth control/protection: Abstinence  Other Topics Concern  . None  Social History Narrative  . None    FAMILY HISTORY: Family History  Problem Relation Age of Onset  . Stomach cancer Mother   . Lung disease Father   . Asthma Father     ALLERGIES:  has No Known Allergies.  MEDICATIONS:  Current Outpatient Medications  Medication Sig Dispense Refill  . acetaminophen (TYLENOL) 325 MG tablet Take 2 tablets (650 mg total) by mouth every 6 (six) hours as needed for mild pain (or Fever >/= 101). 30 tablet 0  . acyclovir (ZOVIRAX) 800 MG tablet Take 1 tablet (800 mg total) by mouth 2 (two) times daily. 60 tablet 2  . azithromycin (ZITHROMAX) 500 MG tablet Take 1 tablet (500 mg total) daily by mouth. 5 tablet 0  . dexamethasone (DECADRON) 4 MG tablet Take 10 tablets (40 mg total) by mouth once a week. 40 tablet 2  . metFORMIN (GLUCOPHAGE) 500 MG tablet Take 1 tablet (500 mg total) by mouth 2 (two) times daily with a meal. 60 tablet 2  . omeprazole (PRILOSEC) 20 MG capsule Take 1 capsule (20 mg total) by mouth daily. 30 capsule 5  . ondansetron (ZOFRAN) 8 MG tablet Take 1 tablet (8 mg total) by mouth every 8 (eight) hours as needed for nausea or vomiting. 30 tablet 1  . polyethylene glycol (MIRALAX / GLYCOLAX) packet Take 17 g by mouth daily as needed.    . promethazine (PHENERGAN) 25 MG tablet Take 1 tablet (25 mg total) by mouth every 6 (six) hours as needed for nausea or vomiting. 30 tablet 0   No current facility-administered medications for this visit.    Facility-Administered Medications Ordered in Other Visits   Medication Dose Route Frequency Provider Last Rate Last Dose  . sodium chloride 0.9 % injection 10 mL  10 mL Intravenous PRN Truitt Merle, MD   10 mL at 12/11/15 1532  . sodium chloride 0.9 % injection 10 mL  10 mL Intravenous PRN Truitt Merle, MD   10 mL at 06/11/16 1505  . sodium chloride flush (NS) 0.9 % injection 10 mL  10 mL Intravenous PRN Truitt Merle, MD   10 mL at 10/09/15  1717    REVIEW OF SYSTEMS:  Constitutional: no abnormal night sweats  Eyes: Denies blurriness of vision, double vision or watery eyes.  Ears, nose, mouth, throat, and face: Denies mucositis  Respiratory: Denies, dyspnea or wheezes  Cardiovascular: Denies palpitation, chest discomfort or lower extremity swelling Gastrointestinal:  Denies nausea, heartburn, reports regular bowel/bladder (+) occasional bloating  Skin: Denies abnormal skin rashes Lymphatics: Denies new lymphadenopathy or easy bruising Neurological:Denies numbness, tingling or new weaknesses Behavioral/Psych: Mood is stable, no new changes  All other systems were reviewed with the patient and are negative.  PHYSICAL EXAMINATION:   ECOG PERFORMANCE STATUS: 0 Vitals:   01/05/17 1021  BP: 95/61  Pulse: 87  Temp: 98.1 F (36.7 C)  TempSrc: Oral  SpO2: 100%    GENERAL:alert, no distress and comfortable SKIN: skin color, texture, turgor are normal, no rashes or significant lesions EYES: normal, conjunctiva are pink and non-injected, sclera clear OROPHARYNX:no exudate, no erythema and lips, buccal mucosa  NECK: supple, thyroid normal size, non-tender, without nodularity LYMPH:  no palpable lymphadenopathy in the cervical, axillary or inguinal LUNGS: clear to auscultation and percussion with normal breathing effort HEART: regular rate & rhythm and no murmurs and no lower extremity edema ABDOMEN:abdomen soft, mildly tender, no hepatomegaly  Musculoskeletal:no cyanosis of digits and no clubbing  PSYCH: alert & oriented x 3 with fluent speech NEURO: no  focal motor/sensory deficits  LABORATORY DATA:  I have reviewed the data as listed CBC Latest Ref Rng & Units 01/05/2017 12/23/2016 12/15/2016  WBC 3.9 - 10.3 10e3/uL 4.8 5.0 7.4  Hemoglobin 11.6 - 15.9 g/dL 9.8(L) 10.3(L) 10.9(L)  Hematocrit 34.8 - 46.6 % 30.4(L) 32.1(L) 35.3  Platelets 145 - 400 10e3/uL 169 194 196    CMP Latest Ref Rng & Units 01/05/2017 12/23/2016 12/23/2016  Glucose 70 - 140 mg/dl 128 85 -  BUN 7.0 - 26.0 mg/dL 14.1 12.9 -  Creatinine 0.6 - 1.1 mg/dL 0.7 0.7 -  Sodium 136 - 145 mEq/L 141 141 -  Potassium 3.5 - 5.1 mEq/L 3.7 4.0 -  Chloride 101 - 111 mmol/L - - -  CO2 22 - 29 mEq/L 25 28 -  Calcium 8.4 - 10.4 mg/dL 9.2 9.8 -  Total Protein 6.4 - 8.3 g/dL 6.2(L) 6.6 6.2  Total Bilirubin 0.20 - 1.20 mg/dL <0.22 0.32 -  Alkaline Phos 40 - 150 U/L 78 94 -  AST 5 - 34 U/L 26 23 -  ALT 0 - 55 U/L 11 10 -    SPEP M-protein  09/15/2014: 4.2 10/08/14: 4.6 12/08/2014: 0.2 02/15/2015: not sufficient sample for test   09/11/2015: not observed 10/09/2015: not det   11/06/2015: not det  12/25/2015: not det  02/13/2016: 0.3 03/12/2016: 0.8 04/09/2016: 0.6 05/08/16: 0.1 06/04/16: 0.1 07/02/2016: 0.2 07/09/2016: 0.2 (dara specific IFE negative) 08/05/16: Not observed  09/01/16: Not observed 09/29/16: 0.1(dara specific IFE negative) 10/27/16: Not observed 11/24/16 : Not observed 12/23/16: 0.1 (Dara specific IFE showed IgG monoclonal protein with lambda light chain)   IgG mg/dl 11/03/2014: 3150  12/08/2014:  759 02/14/2014: 860 09/11/2015: 1347 10/09/2015: 1457 11/06/2015: 1504 12/25/2015: 1400 02/13/2016: 1543 03/12/2016: 1860 04/09/2016: 1620 05/08/16: 749 06/04/16: 751 5/30/20187: 796 07/09/2016: 701 08/05/16: 678 09/01/16: 650 09/29/16: 727 10/27/16: 634 11/24/16: 685  12/23/16: 714   Kappa/lambda light chains levels and ration  11/03/14: 0.75, 152, 0.00 12/22/2014: 1.10, 2.60, 0.42 02/15/2015: 9.59,14.35, 0.67 09/11/2015: 2.44, 2.72, 0.90 10/09/2015: 3.09, 3.49, 0.89 11/06/2015: 2.30,  2.62, 0.88 12/25/2015: 2.51, 3.0, 0.84 02/13/2016: 2.64,  15.3, 0.17 03/12/2016: 2.27, 45.5, 0.05 04/09/2016: 3.33, 45.5, 0.07 05/08/2016: 0.49, 1.21, 0.40 06/04/2016: 0.83, 1.11, 0.75 07/02/16: 0.51, 1.43, 0.36 07/23/16: 0.68, 1.02, 0.67 08/05/16: 0.62, 1.09, 0.57 09/01/16: 0.80, 1.21, 0.66 09/29/16: 0.44, 1.07, 0.41 10/27/16: 0.63, 1.44, 0.44 11/24/16: 0.83, 2.13, 0.39 12/23/16: 0.69, 2.31, 0.30   24 h urine UPEP/IFE and light chain: 11/03/2014: IFE showed a monoclonal IgG heavy chain with associated lambda light chain. M protein was Undetectable    PATHOLOGY   PATHOLOGY REPORT  Bone Marrow (BM) and Peripheral Blood (PB) FINAL PATHOLOGIC DIAGNOSIS  BONE MARROW: 07/11/2015 Hypocellular marrow (20%) with no increase in plasma cells (2%). See comment. PERIPHERAL BLOOD: Mild anemia. No circulating plasma cells identified. See comment and CBC data.  BONE MARROW: 02/22/2016 Diagnosis Bone Marrow, Aspirate,Biopsy, and Clot, right iliac - NORMOCELLULAR BONE MARROW FOR AGE WITH TRILINEAGE HEMATOPOIESIS. - PLASMACYTOSIS (PLASMA CELLS 12%). - SEE COMMENT. PERIPHERAL BLOOD: - OCCASIONAL CIRCULATING PLASMA CELLS.    PROCEDURES   Colonoscopy by Dr. Ardis Hughs on 11/26/16  IMPRESSION - One 2 mm polyp in the transverse colon, removed with a cold biopsy forceps. Resected and retrieved. - Small internal and external hemorrhoids. - The examination was otherwise normal on direct and retroflexion views.   RADIOGRAPHIC STUDIES: I have personally reviewed the radiological images as listed and agreed with the findings in the report. No results found.   CXR 05/08/2016 IMPRESSION:  No acute disease.  CT Biopsy 02/22/16 IMPRESSION: 1. Technically successful CT guided right iliac bone core and aspiration biopsy.  ASSESSMENT & PLAN: 46 y.o. [place] woman, presented with anemia and leokocytosis   1. 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 Unfortunately this showed increased plasma cells 12% with additional lambda light chain staining pending. This is relapse of disease. Cytogenetics was normal  -Her M protein and lambda free light chain level has significantly increased lately, consistent with disease relapse -Her recent LP showed negative CSF, no evidence of CNS involvement. -I have previously spoken with Dr. Norma Fredrickson, we agreed to change her CyBorD to weekly, and add Daratumumab weekly, starting 04/18/16. Potential side effects would discussed with patient, especially cytopenia and infusion reaction, she will take dexamethasone the day before Dara infusion.  -If her disease does not respond to treatment quickly, or if she has intolerance issue to cyBorD, I will change to pomalidomide,  dexamethasone and Dara  -continue acyclovir  -Continue Zometa every 4 weeks for a total of 2 years (until 11/2017) - Due to her hospitalization for severe cytopenia and infection, Cytoxan has been held. -she has restarted weekly Velcade and dexa, and now on Dara every 4 weeks, tolerating well, will continue. Her recent dara-specific IFE was negative for M-protein on 07/07/16 at [facility], she has achieved second CR.  -She saw Dr. Norma Fredrickson in 12/2016 and he recommend repeating a bone marrow transplant and a bone survey. Plans to have  this in spring of 2019. I answered her transplant questions.  -She will proceed with bone marrow biopsy on 17th of this month in our clinic. I encouraged her to bring someone with her. Will do bone survey next week.  -If bone biopsy is clear of leukemia cells, we will continue with current therapy regimen.   -Her SPEP and IFE  detected a small amount M-protein again, the Dara specific  immunofixation was positive, this is concerning for recurrence.  We will continue monitor her SPEP and immunofixation every months -She will continue her metformin, and weekly dexa and Velcade injection and monthly Dara infusion as she is responding well to treatment. She will also continue monthly Zometa injections. Next infusion will be moved to the 18th of Dec.  -F/u in 1 month, with biopsy results.   2. Type 2 diabetes, steroids induced  -She was noticed to have increased blood glucose level lately, with random blood glucose above 200. She also developed diabetic symptoms. No history of diabetes in the past. -This is likely steroids induced hyperglycemia -I strongly recommend her to avoid any soft drink and sweats, and watch her carbohydrate intake, and exercise more  -She has been seen by her primary care physician. She was not given any medication to manage her glucose. I will call her primary care physician to discuss adding metformin. -I previously instructed her to take metformin every day since she is now taking dexamethasone more frequently.  -She restarted her dexamethasone weekly as part of his leukemia treatment, and she will also receive steroids as premedication, I recommend her to take metformin 500 mg twice daily continuously, to control her hyperglycemia.Potential side effects discussed, she agreed to proceed. -Continue close monitoring. -We previously discussed restarting to metformin one pill a day while on steroids to see if her levels will stabilize. I previously encouraged her to take it in the morning with breakfast.  -Due to increase in steroids, Pt will increase metformin to twice daily. I previously  discussed with her, she voiced understanding and will change from daily to twice daily.  -Her BG is now better controlled with metformin.   3. Chronic Hepatitis B  - She will follow-up with the GI clinic at [facility] -continue entecavir, but she run out 2 weeks ago and has not  refilled due to high copay.  Our nursing staff previously called her GI physician's office to request this for her.  - I previously encouraged her to call Dr at [facility] to get help with medication prices -She has not taken medication in 2-3 months.  -She is able to afford her $250 copay, but still not able to get in touch with GI clinic. My nurse has contacted [place] clinic at least twice to request refill for her but it has not been refilled.  - Pt has not gotten her medication refilled. I previously referred her to ID in [place]. She reports that they did not call her and she has not been scheduled for this. I will re-refer her for this.  -She has not been contacted by ID in [place],  But she will f/u with GI in [place] in 12/2016 if she needed. -She has not had f/u with ID for Hep B but is scheduled in 01/2017; has been off medication since July 2018. She will keep that appointment.   4. GERD, history of GI bleeding and nausea  -Continue omeprazole daily. We previously discussed steroids can worsen her acid reflux. -She previously had an EGD at [facility] in 2017. This was benign.  -she did not do her colonoscopy because she did not have anyone to accompany her during visit. She has recently seen Dr. Ardis Hughs again, and is scheduled for colonoscopy. -Colonoscopy on 11/26/16 per Dr. Ardis Hughs notable for a polyp in the transverse colon, revealed to be tubular adenoma and negative for high grade dysplasia  or malignancy.    5. Constipation -I previously encouraged the patient to try Senakot-s, ducalax 1-3 times a day or milk of magnesium if miralax doesn't help with constipation  -I previously advised the patient to take something everyday to help with constipation. -She has been having regular BM every 1-3 days.     Plan  -Refill Dexamethasone, acyclovir, omeprazole and metformin today  -Please change her appointments from 12/17 to 12/18 early morning per her request  -Bone  marrow biopsy 7:30am on 7/18 in infusion room with NP Mendel Ryder  -Lab, flush f/u and dara (rapid infusion) in 4 weeks    All questions were answered. The patient knows to call the clinic with any problems, questions or concerns.  I spent 20 minutes counseling the patient face to face. We used interpreter service. The total time spent in the appointment was 20 minutes and more than 50% was on counseling.   This document serves as a record of services personally performed by Truitt Merle, MD. It was created on her behalf by Joslyn Devon, a trained medical scribe. The creation of this record is based on the scribe's personal observations and the provider's statements to them.    I have reviewed the above documentation for accuracy and completeness, and I agree with the above.   Truitt Merle, MD  01/05/2017

## 2017-01-05 ENCOUNTER — Encounter: Payer: Self-pay | Admitting: Hematology

## 2017-01-05 ENCOUNTER — Ambulatory Visit (HOSPITAL_BASED_OUTPATIENT_CLINIC_OR_DEPARTMENT_OTHER): Payer: Medicaid Other

## 2017-01-05 ENCOUNTER — Ambulatory Visit (HOSPITAL_BASED_OUTPATIENT_CLINIC_OR_DEPARTMENT_OTHER): Payer: Medicaid Other | Admitting: Hematology

## 2017-01-05 VITALS — BP 99/69 | HR 80 | Temp 98.2°F | Resp 17

## 2017-01-05 VITALS — BP 95/61 | HR 87 | Temp 98.1°F

## 2017-01-05 DIAGNOSIS — Z5112 Encounter for antineoplastic immunotherapy: Secondary | ICD-10-CM | POA: Diagnosis not present

## 2017-01-05 DIAGNOSIS — B181 Chronic viral hepatitis B without delta-agent: Secondary | ICD-10-CM | POA: Diagnosis not present

## 2017-01-05 DIAGNOSIS — C9010 Plasma cell leukemia not having achieved remission: Secondary | ICD-10-CM

## 2017-01-05 DIAGNOSIS — K219 Gastro-esophageal reflux disease without esophagitis: Secondary | ICD-10-CM

## 2017-01-05 DIAGNOSIS — D649 Anemia, unspecified: Secondary | ICD-10-CM

## 2017-01-05 DIAGNOSIS — E099 Drug or chemical induced diabetes mellitus without complications: Secondary | ICD-10-CM

## 2017-01-05 DIAGNOSIS — K5900 Constipation, unspecified: Secondary | ICD-10-CM

## 2017-01-05 DIAGNOSIS — C9011 Plasma cell leukemia in remission: Secondary | ICD-10-CM

## 2017-01-05 DIAGNOSIS — C9012 Plasma cell leukemia in relapse: Secondary | ICD-10-CM

## 2017-01-05 DIAGNOSIS — T380X5A Adverse effect of glucocorticoids and synthetic analogues, initial encounter: Secondary | ICD-10-CM

## 2017-01-05 LAB — CBC WITH DIFFERENTIAL/PLATELET
BASO%: 0.1 % (ref 0.0–2.0)
Basophils Absolute: 0 10*3/uL (ref 0.0–0.1)
EOS%: 0.4 % (ref 0.0–7.0)
Eosinophils Absolute: 0 10*3/uL (ref 0.0–0.5)
HCT: 30.4 % — ABNORMAL LOW (ref 34.8–46.6)
HGB: 9.8 g/dL — ABNORMAL LOW (ref 11.6–15.9)
LYMPH%: 12.2 % — AB (ref 14.0–49.7)
MCH: 27.4 pg (ref 25.1–34.0)
MCHC: 32.1 g/dL (ref 31.5–36.0)
MCV: 85.3 fL (ref 79.5–101.0)
MONO#: 0.2 10*3/uL (ref 0.1–0.9)
MONO%: 3.5 % (ref 0.0–14.0)
NEUT%: 83.8 % — ABNORMAL HIGH (ref 38.4–76.8)
NEUTROS ABS: 4 10*3/uL (ref 1.5–6.5)
PLATELETS: 169 10*3/uL (ref 145–400)
RBC: 3.57 10*6/uL — AB (ref 3.70–5.45)
RDW: 15.8 % — ABNORMAL HIGH (ref 11.2–14.5)
WBC: 4.8 10*3/uL (ref 3.9–10.3)
lymph#: 0.6 10*3/uL — ABNORMAL LOW (ref 0.9–3.3)

## 2017-01-05 LAB — COMPREHENSIVE METABOLIC PANEL
ALK PHOS: 78 U/L (ref 40–150)
ALT: 11 U/L (ref 0–55)
AST: 26 U/L (ref 5–34)
Albumin: 3.3 g/dL — ABNORMAL LOW (ref 3.5–5.0)
Anion Gap: 7 mEq/L (ref 3–11)
BUN: 14.1 mg/dL (ref 7.0–26.0)
CALCIUM: 9.2 mg/dL (ref 8.4–10.4)
CO2: 25 mEq/L (ref 22–29)
Chloride: 109 mEq/L (ref 98–109)
Creatinine: 0.7 mg/dL (ref 0.6–1.1)
EGFR: >60 mL/min/{1.73_m2} (ref >60)
Glucose: 128 mg/dl (ref 70–140)
Potassium: 3.7 mEq/L (ref 3.5–5.1)
Sodium: 141 mEq/L (ref 136–145)
TOTAL PROTEIN: 6.2 g/dL — AB (ref 6.4–8.3)

## 2017-01-05 MED ORDER — METHYLPREDNISOLONE SODIUM SUCC 125 MG IJ SOLR
INTRAMUSCULAR | Status: AC
Start: 2017-01-05 — End: 2017-01-05
  Filled 2017-01-05: qty 2

## 2017-01-05 MED ORDER — PROCHLORPERAZINE MALEATE 10 MG PO TABS
ORAL_TABLET | ORAL | Status: AC
Start: 2017-01-05 — End: 2017-01-05
  Filled 2017-01-05: qty 1

## 2017-01-05 MED ORDER — METFORMIN HCL 500 MG PO TABS: 500.0000 mg | ORAL_TABLET | Freq: Two times a day (BID) | ORAL | 5 refills | Status: DC

## 2017-01-05 MED ORDER — ACETAMINOPHEN 325 MG PO TABS
650.0000 mg | ORAL_TABLET | Freq: Once | ORAL | Status: AC
  Administered 2017-01-05: 650 mg via ORAL

## 2017-01-05 MED ORDER — SODIUM CHLORIDE 0.9 % IV SOLN
Freq: Once | INTRAVENOUS | Status: AC
  Administered 2017-01-05: 11:00:00 via INTRAVENOUS

## 2017-01-05 MED ORDER — DARATUMUMAB CHEMO INJECTION 400 MG/20ML
16.0000 mg/kg | Freq: Once | INTRAVENOUS | Status: AC
  Administered 2017-01-05: 900 mg via INTRAVENOUS
  Filled 2017-01-05: qty 40

## 2017-01-05 MED ORDER — HEPARIN SOD (PORK) LOCK FLUSH 100 UNIT/ML IV SOLN
500.0000 [IU] | Freq: Once | INTRAVENOUS | Status: AC | PRN
  Administered 2017-01-05: 500 [IU]
  Filled 2017-01-05: qty 5

## 2017-01-05 MED ORDER — DIPHENHYDRAMINE HCL 25 MG PO CAPS
25.0000 mg | ORAL_CAPSULE | Freq: Once | ORAL | Status: AC
  Administered 2017-01-05: 25 mg via ORAL

## 2017-01-05 MED ORDER — OMEPRAZOLE 20 MG PO CPDR: 20.0000 mg | DELAYED_RELEASE_CAPSULE | Freq: Every day | ORAL | 5 refills | Status: DC

## 2017-01-05 MED ORDER — METHYLPREDNISOLONE SODIUM SUCC 125 MG IJ SOLR
125.0000 mg | Freq: Once | INTRAMUSCULAR | Status: AC
  Administered 2017-01-05: 125 mg via INTRAVENOUS

## 2017-01-05 MED ORDER — BORTEZOMIB CHEMO SQ INJECTION 3.5 MG (2.5MG/ML)
1.5000 mg/m2 | Freq: Once | INTRAMUSCULAR | Status: AC
  Administered 2017-01-05: 2.5 mg via SUBCUTANEOUS
  Filled 2017-01-05: qty 2.5

## 2017-01-05 MED ORDER — SODIUM CHLORIDE 0.9% FLUSH
10.0000 mL | INTRAVENOUS | Status: DC | PRN
  Administered 2017-01-05: 10 mL
  Filled 2017-01-05: qty 10

## 2017-01-05 MED ORDER — ACETAMINOPHEN 325 MG PO TABS
ORAL_TABLET | ORAL | Status: AC
Start: 2017-01-05 — End: 2017-01-05
  Filled 2017-01-05: qty 2

## 2017-01-05 MED ORDER — DEXAMETHASONE 4 MG PO TABS: 40.0000 mg | ORAL_TABLET | ORAL | 2 refills | Status: DC

## 2017-01-05 MED ORDER — DIPHENHYDRAMINE HCL 25 MG PO CAPS
ORAL_CAPSULE | ORAL | Status: AC
  Filled 2017-01-05: qty 1

## 2017-01-05 MED ORDER — PROCHLORPERAZINE MALEATE 10 MG PO TABS
10.0000 mg | ORAL_TABLET | Freq: Once | ORAL | Status: AC
  Administered 2017-01-05: 10 mg via ORAL

## 2017-01-05 MED ORDER — ACYCLOVIR 800 MG PO TABS: 800.0000 mg | ORAL_TABLET | Freq: Two times a day (BID) | ORAL | 2 refills | Status: DC

## 2017-01-05 NOTE — Patient Instructions (Signed)
[facility] Discharge Instructions for Patients Receiving Chemotherapy  Today you received the following chemotherapy agents: Darzalex and Velcade.  To help prevent nausea and vomiting after your treatment, we encourage you to take your nausea medication as prescribed.   If you develop nausea and vomiting that is not controlled by your nausea medication, call the clinic.   BELOW ARE SYMPTOMS THAT SHOULD BE REPORTED IMMEDIATELY:  *FEVER GREATER THAN 100.5 F  *CHILLS WITH OR WITHOUT FEVER  NAUSEA AND VOMITING THAT IS NOT CONTROLLED WITH YOUR NAUSEA MEDICATION  *UNUSUAL SHORTNESS OF BREATH  *UNUSUAL BRUISING OR BLEEDING  TENDERNESS IN MOUTH AND THROAT WITH OR WITHOUT PRESENCE OF ULCERS  *URINARY PROBLEMS  *BOWEL PROBLEMS  UNUSUAL RASH Items with * indicate a potential emergency and should be followed up as soon as possible.  Feel free to call the clinic should you have any questions or concerns. The clinic phone number is [phone].  Please show the [place] at check-in to the Emergency Department and triage nurse.

## 2017-01-07 ENCOUNTER — Telehealth: Payer: Self-pay | Admitting: Hematology

## 2017-01-07 NOTE — Telephone Encounter (Signed)
Used interpreter line to leave message for patient regarding upcoming December appointments

## 2017-01-12 ENCOUNTER — Ambulatory Visit: Payer: Medicaid Other

## 2017-01-12 ENCOUNTER — Ambulatory Visit (HOSPITAL_COMMUNITY): Payer: Medicaid Other

## 2017-01-15 ENCOUNTER — Ambulatory Visit (HOSPITAL_COMMUNITY)
Admission: RE | Admit: 2017-01-15 | Discharge: 2017-01-15 | Disposition: A | Discharge status: Home / Self Care | Payer: Medicaid Other | Source: Ambulatory Visit | Attending: Hematology | Admitting: Hematology

## 2017-01-15 ENCOUNTER — Ambulatory Visit (HOSPITAL_BASED_OUTPATIENT_CLINIC_OR_DEPARTMENT_OTHER): Payer: Medicaid Other

## 2017-01-15 ENCOUNTER — Telehealth: Payer: Self-pay | Admitting: *Deleted

## 2017-01-15 ENCOUNTER — Ambulatory Visit (HOSPITAL_BASED_OUTPATIENT_CLINIC_OR_DEPARTMENT_OTHER): Payer: Medicaid Other | Admitting: Hematology

## 2017-01-15 ENCOUNTER — Telehealth: Payer: Self-pay | Admitting: Hematology

## 2017-01-15 ENCOUNTER — Other Ambulatory Visit: Payer: Self-pay | Admitting: Hematology

## 2017-01-15 ENCOUNTER — Encounter: Payer: Self-pay | Admitting: Hematology

## 2017-01-15 VITALS — BP 108/81 | HR 94 | Temp 98.2°F | Resp 18

## 2017-01-15 DIAGNOSIS — E099 Drug or chemical induced diabetes mellitus without complications: Secondary | ICD-10-CM | POA: Diagnosis not present

## 2017-01-15 DIAGNOSIS — K7689 Other specified diseases of liver: Secondary | ICD-10-CM | POA: Diagnosis not present

## 2017-01-15 DIAGNOSIS — C9012 Plasma cell leukemia in relapse: Secondary | ICD-10-CM | POA: Diagnosis not present

## 2017-01-15 DIAGNOSIS — R1010 Upper abdominal pain, unspecified: Secondary | ICD-10-CM | POA: Insufficient documentation

## 2017-01-15 DIAGNOSIS — C9010 Plasma cell leukemia not having achieved remission: Secondary | ICD-10-CM

## 2017-01-15 DIAGNOSIS — D739 Disease of spleen, unspecified: Secondary | ICD-10-CM | POA: Diagnosis not present

## 2017-01-15 DIAGNOSIS — B181 Chronic viral hepatitis B without delta-agent: Secondary | ICD-10-CM

## 2017-01-15 DIAGNOSIS — N200 Calculus of kidney: Secondary | ICD-10-CM | POA: Diagnosis not present

## 2017-01-15 DIAGNOSIS — Z5112 Encounter for antineoplastic immunotherapy: Secondary | ICD-10-CM

## 2017-01-15 DIAGNOSIS — K219 Gastro-esophageal reflux disease without esophagitis: Secondary | ICD-10-CM | POA: Diagnosis not present

## 2017-01-15 LAB — CBC WITH DIFFERENTIAL/PLATELET
BASO%: 0.3 % (ref 0.0–2.0)
Basophils Absolute: 0 10*3/uL (ref 0.0–0.1)
EOS ABS: 0 10*3/uL (ref 0.0–0.5)
EOS%: 0.5 % (ref 0.0–7.0)
HCT: 31.2 % — ABNORMAL LOW (ref 34.8–46.6)
HEMOGLOBIN: 10.1 g/dL — AB (ref 11.6–15.9)
LYMPH%: 16.7 % (ref 14.0–49.7)
MCH: 27.6 pg (ref 25.1–34.0)
MCHC: 32.5 g/dL (ref 31.5–36.0)
MCV: 85 fL (ref 79.5–101.0)
MONO#: 0.6 10*3/uL (ref 0.1–0.9)
MONO%: 11 % (ref 0.0–14.0)
NEUT%: 71.5 % (ref 38.4–76.8)
NEUTROS ABS: 3.8 10*3/uL (ref 1.5–6.5)
PLATELETS: 219 10*3/uL (ref 145–400)
RBC: 3.67 10*6/uL — ABNORMAL LOW (ref 3.70–5.45)
RDW: 15.3 % — AB (ref 11.2–14.5)
WBC: 5.4 10*3/uL (ref 3.9–10.3)
lymph#: 0.9 10*3/uL (ref 0.9–3.3)

## 2017-01-15 LAB — COMPREHENSIVE METABOLIC PANEL
ALBUMIN: 3.5 g/dL (ref 3.5–5.0)
ALK PHOS: 83 U/L (ref 40–150)
ALT: 8 U/L (ref 0–55)
ANION GAP: 9 meq/L (ref 3–11)
AST: 22 U/L (ref 5–34)
BUN: 14.4 mg/dL (ref 7.0–26.0)
CALCIUM: 9.1 mg/dL (ref 8.4–10.4)
CHLORIDE: 106 meq/L (ref 98–109)
CO2: 24 mEq/L (ref 22–29)
Creatinine: 0.7 mg/dL (ref 0.6–1.1)
Glucose: 94 mg/dl (ref 70–140)
POTASSIUM: 4 meq/L (ref 3.5–5.1)
Sodium: 140 mEq/L (ref 136–145)
Total Bilirubin: <0.22 mg/dL (ref 0.20–1.20)
Total Protein: 6.6 g/dL (ref 6.4–8.3)

## 2017-01-15 MED ORDER — PROCHLORPERAZINE EDISYLATE 5 MG/ML IJ SOLN: 10.0000 mg | Freq: Once | INTRAMUSCULAR | Status: DC

## 2017-01-15 MED ORDER — SODIUM CHLORIDE 0.9% FLUSH
10.0000 mL | INTRAVENOUS | Status: DC | PRN
  Administered 2017-01-15: 10 mL
  Filled 2017-01-15: qty 10

## 2017-01-15 MED ORDER — HEPARIN SOD (PORK) LOCK FLUSH 100 UNIT/ML IV SOLN
500.0000 [IU] | Freq: Once | INTRAVENOUS | Status: AC | PRN
  Administered 2017-01-15: 500 [IU]
  Filled 2017-01-15: qty 5

## 2017-01-15 MED ORDER — SODIUM CHLORIDE 0.9 % IV SOLN
Freq: Once | INTRAVENOUS | Status: DC
Start: 2017-01-15 — End: 2017-01-15

## 2017-01-15 MED ORDER — PROCHLORPERAZINE MALEATE 10 MG PO TABS: 10.0000 mg | ORAL_TABLET | Freq: Once | ORAL | Status: DC

## 2017-01-15 MED ORDER — BORTEZOMIB CHEMO SQ INJECTION 3.5 MG (2.5MG/ML)
1.5000 mg/m2 | Freq: Once | INTRAMUSCULAR | Status: AC
  Administered 2017-01-15: 2.5 mg via SUBCUTANEOUS
  Filled 2017-01-15: qty 2.5

## 2017-01-15 NOTE — Telephone Encounter (Signed)
Called pt at home and spoke with daughter.  Informed daughter that US Abdomen results - gallbladder fine.  Instructed pt to take Prilosec 20 mg BID as per Dr. Ernestina Penna instructions.  Daughter voiced understanding and stated she would relay message to pt.

## 2017-01-15 NOTE — Patient Instructions (Signed)
[facility] Discharge Instructions for Patients Receiving Chemotherapy  Today you received the following chemotherapy agents Velcade.  To help prevent nausea and vomiting after your treatment, we encourage you to take your nausea medication as directed.  If you develop nausea and vomiting that is not controlled by your nausea medication, call the clinic.   BELOW ARE SYMPTOMS THAT SHOULD BE REPORTED IMMEDIATELY:  *FEVER GREATER THAN 100.5 F  *CHILLS WITH OR WITHOUT FEVER  NAUSEA AND VOMITING THAT IS NOT CONTROLLED WITH YOUR NAUSEA MEDICATION  *UNUSUAL SHORTNESS OF BREATH  *UNUSUAL BRUISING OR BLEEDING  TENDERNESS IN MOUTH AND THROAT WITH OR WITHOUT PRESENCE OF ULCERS  *URINARY PROBLEMS  *BOWEL PROBLEMS  UNUSUAL RASH Items with * indicate a potential emergency and should be followed up as soon as possible.  Feel free to call the clinic should you have any questions or concerns. The clinic phone number is [phone].  Please show the CHEMO ALERT CARD at check-in to the Emergency Department and triage nurse.   

## 2017-01-15 NOTE — Progress Notes (Signed)
Patient was an ADD ON velcade to infusion today - NO LABS today or MD visit per MD Burr Medico - will do labs again 01/21/17.

## 2017-01-15 NOTE — Progress Notes (Signed)
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      10/10/2014 Miscellaneous    Peripheral blood chemistry and leukocytosis with total white count 78K, comprised of large plasma cells and his normocytic anemia. There is a myeloid left shift with previous surgical radium blasts. Flow cytometry showed 64% plasma cells      10/10/2014 Bone Marrow Biopsy    Markedly hypercellular marrow (95%), Atypical plasma cells comprise 57% of the cellularity. There was diminished multilineage in hematopoiesis with adequate maturation. Breasts less than 1%), no overt dysplasia of the myeloid or erythroid lineages.       10/10/2014 Initial Diagnosis    Plasma cell leukemia      10/13/2014 - 02/22/2015 Chemotherapy    CyborD (cytoxan 3106m/m2 iv, bortezomib 1.5 mg/m, dexamethasone 40 mg, weekly every 28 days, bortezomib and dexamethasone was given twice weekly for 2 weeks during the first cycle)      04/04/2015 Bone Marrow Transplant    [facility] course was complicated by sepsis from Escherichia coli bacteremia and associated colitis, she was discharged home on 04/27/2015.      05/07/2015 - 05/12/2015 Hospital Admission    [facility], [facility]. ID workup was negative, EGD showed evidence of gastritis and duodenitis, no H. pylori or CMV.      05/20/2015 -  05/24/2015 Hospital Admission    [facility]      07/11/2015 Bone Marrow Biopsy    Post transplant 100 a bone marrow biopsy showed hypocellular marrow, 20%, no increase in plasma cells (2%) or other abnormalities.      08/22/2015 - 01/02/2016 Chemotherapy    MaintenanceCyborD (cytoxan 3086mm2 iv, bortezomib 1.5 mg/m, dexamethasone 40 mg, every 2 weeks, changed to Velcade maintenance after 4 months treatment      01/16/2016 - 04/19/2016 Chemotherapy    Maintenance Velcade 1.3 mg/m every 2 weeks      02/22/2016 Pathology Results    BONE MARROW: Diagnosis Bone Marrow, Aspirate,Biopsy, and Clot, right iliac - NORMOCELLULAR BONE MARROW FOR AGE WITH TRILINEAGE HEMATOPOIESIS. - PLASMACYTOSIS (PLASMA CELLS 12%). - SEE COMMENT. PERIPHERAL BLOOD: - OCCASIONAL CIRCULATING PLASMA CELLS.      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 female is here because of recently diagnosed plasma cell leukemia. She was recently discharged form [facility] days ago and is here to establish her local oncological care with [place] is accompanied to the clinic by  her daughter and interpreter.  She presented to our hospital lth with worsening dyspnea, fatigue, cough, subjective fevers and chills, and was admitted on 09/14/2014. She was found to have allergy WBC 54.1K, hemoglobin 8.1, plt 310, she was treated with symptom management, and was discharged home on 09/17/2014, with a plan to follow-up with hematology. She represents to emergency room on 10/07/2014,  was found to have white count of 78K, and worsening anemia with hemoglobin 6. She was seen by my partner Dr. Jonette Eva and plasma cell leukemia was suspected. She was transferred to [facility] for further leukemia work-up and treatment. Bone marrow biopsy was done, which confirmed plasma cell leukemia, and she started chemotherapy with Velcade, Cytoxan and dexamethasone. She received weekly dose twice, last dose on 10/20/2014. She was discharged to home afterwards. She had a mediport placed during her hospitalization.  She has moderate fatigue, low appetite, she lost about 13 lbs in the past few months. She has moderate abdominal pain, 5-6/10, persistent, she does not take any pain meds.   I reviewed her medical records extensively, and discussed her case with Dr. Jorje Guild and [place] program coordinator.  CURRENT THERAPY:   1. Velcade 1.42m/m2 and dexa 462mweekly 2. Daratumumab weekly started on 04/18/2016, changed to every 2 weeks on 06/25/2016 and changed to every 4 weeks after 10/13/2016, per protocol  3. zometa every 4 weeks started on 11/27/2015   INTERIM HISTORY:   Sheryl [name] is here for Velcade injection.  Her infusion nurse call me due to her complaint of abdominal pain and I saw her in the infusion room.  She states the pain is mainly in the right upper quadrant of her abdomen, constant, and sometimes gets to 8 out of 10, no relationship to food or eating.  She denies any nausea, change of her appetite, fever, or other new complaints.   MEDICAL HISTORY:  Past Medical History:  Diagnosis Date  . Chills with fever    intermittently since d/c from hospital  . Dysuria-frequency syndrome    w/ pink urine  . GERD (gastroesophageal reflux disease)   . Hepatitis   . History of positive PPD    DX 2011--  CXR DONE NO EVIDENCE  . History of ureter stent   . Hydronephrosis, right   . Neuromuscular disorder (HCC)    legs numb intermittently  . 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[place]  . Pneumonia     . Right ureteral stone   . Urosepsis 8/14   admitted to [facility]    SURGICAL HISTORY: Past Surgical History:  Procedure Laterality Date  . CYSTOSCOPY W/ URETERAL STENT PLACEMENT Right 09/25/2012   Procedure: CYSTOSCOPY WITH RETROGRADE PYELOGRAM/URETERAL STENT PLACEMENT;  Surgeon: ThAlexis FrockMD;  Location: [facility];  Service: Urology;  Laterality: Right;  . CYSTOSCOPY WITH RETROGRADE PYELOGRAM, URETEROSCOPY AND STENT PLACEMENT Right 10/15/2012   Procedure: CYSTOSCOPY WITH RETROGRADE PYELOGRAM, URETEROSCOPY AND REMOVAL STENT WITH  STENT PLACEMENT;  Surgeon: ThAlexis FrockMD;  [facility];  Laterality: Right;  . ESOPHAGOGASTRODUODENOSCOPY (EGD) WITH PROPOFOL N/A 11/16/2014   Procedure: ESOPHAGOGASTRODUODENOSCOPY (EGD) WITH PROPOFOL;  Surgeon: DaMilus BanisterMD;  Location: [facility];  Service: Endoscopy;  Laterality: N/A;  . HOLMIUM LASER APPLICATION Right 10/05/20/9798 Procedure: HOLMIUM LASER APPLICATION;  Surgeon: ThAlexis FrockMD;  [facility];  Laterality: Right;  . LIVER BIOPSY    . OTHER SURGICAL HISTORY Right    removal of ovarian cyst  .  removal of uterine cyst     years ago  . RIGHT VATS W/ DRAINAGE PEURAL EFFUSION AND BX'S  10-30-2008    SOCIAL HISTORY: Social History   Socioeconomic History  . Marital status: Single    Spouse name: None  . Number of children: 3  . Years of education: None  . Highest education level: None  Social Needs  . Financial resource strain: None  . Food insecurity - worry: None  . Food insecurity - inability: None  . Transportation needs - medical: None  . Transportation needs - non-medical: None  Occupational History  . None  Tobacco Use  . Smoking status: Never Smoker  . Smokeless tobacco: Never Used  Substance and Sexual Activity  . Alcohol use: No    Alcohol/week: 0.0 oz  . Drug use: No  . Sexual activity: Not Currently    Birth  control/protection: Abstinence  Other Topics Concern  . None  Social History Narrative  . None    FAMILY HISTORY: Family History  Problem Relation Age of Onset  . Stomach cancer Mother   . Lung disease Father   . Asthma Father     ALLERGIES:  has No Known Allergies.  MEDICATIONS:  Current Outpatient Medications  Medication Sig Dispense Refill  . acetaminophen (TYLENOL) 325 MG tablet Take 2 tablets (650 mg total) by mouth every 6 (six) hours as needed for mild pain (or Fever >/= 101). 30 tablet 0  . acyclovir (ZOVIRAX) 800 MG tablet Take 1 tablet (800 mg total) by mouth 2 (two) times daily. 60 tablet 2  . dexamethasone (DECADRON) 4 MG tablet Take 10 tablets (40 mg total) by mouth once a week. 40 tablet 2  . metFORMIN (GLUCOPHAGE) 500 MG tablet Take 1 tablet (500 mg total) by mouth 2 (two) times daily with a meal. 60 tablet 5  . omeprazole (PRILOSEC) 20 MG capsule Take 1 capsule (20 mg total) by mouth daily. 30 capsule 5  . ondansetron (ZOFRAN) 8 MG tablet Take 1 tablet (8 mg total) by mouth every 8 (eight) hours as needed for nausea or vomiting. 30 tablet 1  . polyethylene glycol (MIRALAX / GLYCOLAX) packet Take 17 g by mouth daily as needed.    . promethazine (PHENERGAN) 25 MG tablet Take 1 tablet (25 mg total) by mouth every 6 (six) hours as needed for nausea or vomiting. 30 tablet 0   No current facility-administered medications for this visit.    Facility-Administered Medications Ordered in Other Visits  Medication Dose Route Frequency Provider Last Rate Last Dose  . 0.9 %  sodium chloride infusion   Intravenous Once Truitt Merle, MD      . prochlorperazine (COMPAZINE) tablet 10 mg  10 mg Oral Once Truitt Merle, MD      . sodium chloride 0.9 % injection 10 mL  10 mL Intravenous PRN Truitt Merle, MD   10 mL at 12/11/15 1532  . sodium chloride 0.9 % injection 10 mL  10 mL Intravenous PRN Truitt Merle, MD   10 mL at 06/11/16 1505  . sodium chloride flush (NS) 0.9 % injection 10 mL  10 mL  Intravenous PRN Truitt Merle, MD   10 mL at 10/09/15 1717  . sodium chloride flush (NS) 0.9 % injection 10 mL  10 mL Intracatheter PRN Truitt Merle, MD   10 mL at 01/15/17 7915    REVIEW OF SYSTEMS:  Constitutional: no abnormal night sweats  Eyes: Denies blurriness of vision, double vision or watery eyes.  Ears, nose, mouth, throat, and face: Denies mucositis  Respiratory: Denies, dyspnea or wheezes  Cardiovascular: Denies palpitation, chest discomfort or lower extremity swelling Gastrointestinal:  Denies nausea, heartburn, reports regular bowel/bladder (+) occasional bloating  Skin: Denies abnormal skin rashes Lymphatics: Denies new lymphadenopathy or easy bruising Neurological:Denies numbness, tingling or new weaknesses Behavioral/Psych: Mood is stable, no new changes  All other systems were reviewed with the patient and are negative.  PHYSICAL EXAMINATION:   ECOG PERFORMANCE STATUS: 1 Blood pressure 108/81, heart rate 94, respiratory rate 18, temperature 36.8, pulse ox 97% on room air GENERAL:alert, no distress and comfortable SKIN: skin color, texture, turgor are normal, no rashes or significant lesions EYES: normal, conjunctiva are pink and non-injected, sclera clear OROPHARYNX:no exudate, no erythema and lips, buccal mucosa  NECK: supple, thyroid normal size, non-tender, without nodularity LYMPH:  no palpable lymphadenopathy in the cervical, axillary or inguinal LUNGS: clear to auscultation and percussion with normal breathing effort HEART: regular rate & rhythm and no murmurs and no lower extremity edema ABDOMEN:abdomen soft, moderate tenderness in the right upper quadrant of abdomen, no rebound pain, no Murphy sign, no hepatomegaly. Musculoskeletal:no cyanosis of digits and no clubbing  PSYCH: alert & oriented x 3 with fluent speech NEURO: no focal motor/sensory deficits  LABORATORY DATA:  I have reviewed the data as listed CBC Latest Ref Rng & Units 01/05/2017 12/23/2016  12/15/2016  WBC 3.9 - 10.3 10e3/uL 4.8 5.0 7.4  Hemoglobin 11.6 - 15.9 g/dL 9.8(L) 10.3(L) 10.9(L)  Hematocrit 34.8 - 46.6 % 30.4(L) 32.1(L) 35.3  Platelets 145 - 400 10e3/uL 169 194 196    CMP Latest Ref Rng & Units 01/05/2017 12/23/2016 12/23/2016  Glucose 70 - 140 mg/dl 128 85 -  BUN 7.0 - 26.0 mg/dL 14.1 12.9 -  Creatinine 0.6 - 1.1 mg/dL 0.7 0.7 -  Sodium 136 - 145 mEq/L 141 141 -  Potassium 3.5 - 5.1 mEq/L 3.7 4.0 -  Chloride 101 - 111 mmol/L - - -  CO2 22 - 29 mEq/L 25 28 -  Calcium 8.4 - 10.4 mg/dL 9.2 9.8 -  Total Protein 6.4 - 8.3 g/dL 6.2(L) 6.6 6.2  Total Bilirubin 0.20 - 1.20 mg/dL <0.22 0.32 -  Alkaline Phos 40 - 150 U/L 78 94 -  AST 5 - 34 U/L 26 23 -  ALT 0 - 55 U/L 11 10 -    SPEP M-protein  09/15/2014: 4.2 10/08/14: 4.6 12/08/2014: 0.2 02/15/2015: not sufficient sample for test   09/11/2015: not observed 10/09/2015: not det   11/06/2015: not det  12/25/2015: not det  02/13/2016: 0.3 03/12/2016: 0.8 04/09/2016: 0.6 05/08/16: 0.1 06/04/16: 0.1 07/02/2016: 0.2 07/09/2016: 0.2 (dara specific IFE negative) 08/05/16: Not observed  09/01/16: Not observed 09/29/16: 0.1(dara specific IFE negative) 10/27/16: Not observed 11/24/16 : Not observed 12/23/16: 0.1 (Dara specific IFE showed IgG monoclonal protein with lambda light chain)   IgG mg/dl 11/03/2014: 3150  12/08/2014:  759 02/14/2014: 860 09/11/2015: 1347 10/09/2015: 1457 11/06/2015: 1504 12/25/2015: 1400 02/13/2016: 1543 03/12/2016: 1860 04/09/2016: 1620 05/08/16: 749 06/04/16: 751 5/30/20187: 796 07/09/2016: 701 08/05/16: 678 09/01/16: 650 09/29/16: 727 10/27/16: 634 11/24/16: 685  12/23/16: 714   Kappa/lambda light chains levels and ration  11/03/14: 0.75, 152, 0.00 12/22/2014: 1.10, 2.60, 0.42 02/15/2015: 9.59,14.35, 0.67 09/11/2015: 2.44, 2.72, 0.90 10/09/2015: 3.09, 3.49, 0.89 11/06/2015: 2.30, 2.62, 0.88 12/25/2015: 2.51, 3.0, 0.84 02/13/2016: 2.64, 15.3, 0.17 03/12/2016: 2.27, 45.5, 0.05 04/09/2016: 3.33, 45.5, 0.07 05/08/2016:  0.49, 1.21, 0.40 06/04/2016: 0.83, 1.11, 0.75 07/02/16: 0.51, 1.43, 0.36  07/23/16: 0.68, 1.02, 0.67 08/05/16: 0.62, 1.09, 0.57 09/01/16: 0.80, 1.21, 0.66 09/29/16: 0.44, 1.07, 0.41 10/27/16: 0.63, 1.44, 0.44 11/24/16: 0.83, 2.13, 0.39 12/23/16: 0.69, 2.31, 0.30   24 h urine UPEP/IFE and light chain: 11/03/2014: IFE showed a monoclonal IgG heavy chain with associated lambda light chain. M protein was Undetectable    PATHOLOGY   PATHOLOGY REPORT  Bone Marrow (BM) and Peripheral Blood (PB) FINAL PATHOLOGIC DIAGNOSIS  BONE MARROW: 07/11/2015 Hypocellular marrow (20%) with no increase in plasma cells (2%). See comment. PERIPHERAL BLOOD: Mild anemia. No circulating plasma cells identified. See comment and CBC data.  BONE MARROW: 02/22/2016 Diagnosis Bone Marrow, Aspirate,Biopsy, and Clot, right iliac - NORMOCELLULAR BONE MARROW FOR AGE WITH TRILINEAGE HEMATOPOIESIS. - PLASMACYTOSIS (PLASMA CELLS 12%). - SEE COMMENT. PERIPHERAL BLOOD: - OCCASIONAL CIRCULATING PLASMA CELLS.    PROCEDURES   Colonoscopy by Dr. Ardis Hughs on 11/26/16  IMPRESSION - One 2 mm polyp in the transverse colon, removed with a cold biopsy forceps. Resected and retrieved. - Small internal and external hemorrhoids. - The examination was otherwise normal on direct and retroflexion views.   RADIOGRAPHIC STUDIES: I have personally reviewed the radiological images as listed and agreed with the findings in the report. No results found.   CXR 05/08/2016 IMPRESSION:  No acute disease.  CT Biopsy 02/22/16 IMPRESSION: 1. Technically successful CT guided right iliac bone core and aspiration biopsy.  ASSESSMENT & PLAN: 46 y.o. [place] woman, presented with anemia and leokocytosis   1. Abdominal pain  -Her previous CT scan in 2017 showed gallbladder wall thickening, no stones -She did have a history of abdominal pain, intermittent, had a EGD in 11/2014 which was normal  -I will get abdominal US today for further  evaluation  -I encouraged her to eat a bland diet, and increase omeprazole from daily to twice daily  2. 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 Unfortunately this showed increased plasma cells 12% with additional lambda light chain staining pending. This is relapse of disease. Cytogenetics was normal  -Her M protein and lambda free light chain level has significantly increased lately, consistent with disease relapse -Her recent LP showed negative CSF, no evidence of CNS involvement. -I have previously spoken with Dr. Norma Fredrickson, we agreed to change her CyBorD to weekly, and add Daratumumab weekly, starting 04/18/16. Potential side effects would discussed with patient, especially cytopenia and infusion reaction, she will take dexamethasone the day before Dara infusion.  -If her disease does not respond to treatment quickly, or if she has intolerance issue to cyBorD, I will change to pomalidomide,  dexamethasone and Dara  -continue acyclovir  -Continue Zometa every 4 weeks for a total of 2 years (until 11/2017) - Due to her hospitalization for severe cytopenia and infection, Cytoxan has been held. -she has restarted weekly Velcade and dexa, and now on Dara every 4 weeks, tolerating well, will continue. Her recent dara-specific IFE was negative for M-protein on 07/07/16 at [facility], she has achieved second CR.  -She saw Dr. Norma Fredrickson in 12/2016 and he recommend repeating a bone marrow transplant and a bone survey. Plans to have  this in spring of 2019. I answered her transplant questions.  -She will proceed with bone marrow biopsy on 17th of this month  in our clinic. I encouraged her to bring someone with her.  Will do bone survey next week.  -If bone biopsy is clear of leukemia cells, we will continue with current therapy regimen.   -Her SPEP and IFE detected a small amount M-protein again, the Dara specific immunofixation was positive, this is concerning for recurrence.  We will continue monitor her SPEP and immunofixation every months -She will continue her metformin, and weekly dexa and Velcade injection and monthly Dara infusion as she is responding well to treatment. She will also continue monthly Zometa injections. Next infusion will be moved to the 18th of Dec.  -She is scheduled for a bone marrow biopsy here next week  2. Type 2 diabetes, steroids induced  -She was noticed to have increased blood glucose level lately, with random blood glucose above 200. She also developed diabetic symptoms. No history of diabetes in the past. -This is likely steroids induced hyperglycemia -I strongly recommend her to avoid any soft drink and sweats, and watch her carbohydrate intake, and exercise more  -She has been seen by her primary care physician. She was not given any medication to manage her glucose. I will call her primary care physician to discuss adding metformin. -I previously instructed her to take metformin every day since she is now taking dexamethasone more frequently.  -She restarted her dexamethasone weekly as part of his leukemia treatment, and she will also receive steroids as premedication, I recommend her to take metformin 500 mg twice daily continuously, to control her hyperglycemia.Potential side effects discussed, she agreed to proceed. -Continue close monitoring. -We previously discussed restarting to metformin one pill a day while on steroids to see if her levels will stabilize. I previously encouraged her to take it in the morning with breakfast.  -Due to increase in steroids, Pt will increase metformin to twice daily. I previously  discussed with her, she voiced understanding and will  change from daily to twice daily.  -Her BG is now better controlled with metformin.   3. Chronic Hepatitis B  - She will follow-up with the GI clinic at [facility] -continue entecavir, but she run out 2 weeks ago and has not refilled due to high copay.  Our nursing staff previously called her GI physician's office to request this for her.  - I previously encouraged her to call Dr at [facility] to get help with medication prices -She has not taken medication in 2-3 months.  -She is able to afford her $250 copay, but still not able to get in touch with GI clinic. My nurse has contacted [place] [facility] at least twice to request refill for her but it has not been refilled.  - Pt has not gotten her medication refilled. I previously referred her to ID in [place]. She reports that they did not call her and she has not been scheduled for this. I will re-refer her for this.  -She has not been contacted by ID in [place],  But she will f/u with GI in [place] in 12/2016 if she needed. -She has not had f/u with ID for Hep B but is scheduled in 01/2017; has been off medication since July 2018. She will keep that appointment.   4. GERD, history of GI bleeding and nausea  -Continue omeprazole daily. We previously discussed steroids can worsen her acid reflux. -She previously had an EGD at [facility] in 2017. This was benign.  -she did not do her colonoscopy because she did not have anyone  to accompany her during visit. She has recently seen Dr. Ardis Hughs again, and is scheduled for colonoscopy. -Colonoscopy on 11/26/16 per Dr. Ardis Hughs notable for a polyp in the transverse colon, revealed to be tubular adenoma and negative for high grade dysplasia or malignancy.    Plan  -stat abdominal ultrasound this morning -Increase omeprazole to twice daily -We will proceed with Velcade injection today, hold the dexamethasone due to her abdominal pain -She will return in 1 week for Velcade injection and a bone  marrow biopsy, she knows to be fasting for biopsy   All questions were answered. The patient knows to call the clinic with any problems, questions or concerns.  I spent 15 minutes counseling the patient face to face.  The total time spent in the appointment was 20 minutes and more than 50% was on counseling.      Truitt Merle, MD  01/15/2017   Addendum   Her [place] result reviewed, no significant findings which can explain her pain. Lab also unremarkable. Will continue monitoring. Inform pt about her [place] result.  Truitt Merle  01/15/2017 11:43 AM

## 2017-01-15 NOTE — Progress Notes (Signed)
At 0852: Pt c/o of 8/9 out of 10 pain in RUQ abdomen that has persisted for about 4 days. Called Dr. Burr Medico with update who came and assessed pt in treatment area. Dr. Burr Medico approved for pt to still receive Velcade injection, but also ordered for standing labs to be drawn and a STAT abdominal US. Labs drawn (medial side of port accessed) and pt tolerated well.  Port flushed with heparin and deacessed without issues. Pt instructed not to take steroid so as not to worsen abdominal pain and to remain NPO until after [place] completed. Pt verbalized understanding and agreement, and then escorted by RN to [place].

## 2017-01-15 NOTE — Telephone Encounter (Signed)
Patient came in Dr Burr Medico  And charge said ok to add infusion today

## 2017-01-19 ENCOUNTER — Ambulatory Visit: Payer: Medicaid Other

## 2017-01-19 ENCOUNTER — Other Ambulatory Visit: Payer: Self-pay | Admitting: Hematology

## 2017-01-21 ENCOUNTER — Encounter: Payer: Self-pay | Admitting: Hematology

## 2017-01-21 ENCOUNTER — Other Ambulatory Visit (HOSPITAL_COMMUNITY)
Admission: RE | Admit: 2017-01-21 | Discharge: 2017-01-21 | Disposition: A | Discharge status: Home / Self Care | Payer: Medicaid Other | Source: Ambulatory Visit | Attending: Hematology | Admitting: Hematology

## 2017-01-21 ENCOUNTER — Ambulatory Visit: Payer: Medicaid Other

## 2017-01-21 ENCOUNTER — Other Ambulatory Visit (HOSPITAL_BASED_OUTPATIENT_CLINIC_OR_DEPARTMENT_OTHER): Payer: Medicaid Other

## 2017-01-21 ENCOUNTER — Ambulatory Visit (HOSPITAL_BASED_OUTPATIENT_CLINIC_OR_DEPARTMENT_OTHER): Payer: Medicaid Other | Admitting: Hematology

## 2017-01-21 VITALS — BP 114/86 | HR 84 | Temp 97.7°F | Resp 18

## 2017-01-21 DIAGNOSIS — D509 Iron deficiency anemia, unspecified: Secondary | ICD-10-CM | POA: Insufficient documentation

## 2017-01-21 DIAGNOSIS — C9012 Plasma cell leukemia in relapse: Secondary | ICD-10-CM | POA: Diagnosis not present

## 2017-01-21 DIAGNOSIS — C9010 Plasma cell leukemia not having achieved remission: Secondary | ICD-10-CM

## 2017-01-21 DIAGNOSIS — Z5112 Encounter for antineoplastic immunotherapy: Secondary | ICD-10-CM | POA: Diagnosis not present

## 2017-01-21 DIAGNOSIS — C9011 Plasma cell leukemia in remission: Secondary | ICD-10-CM

## 2017-01-21 LAB — CBC WITH DIFFERENTIAL/PLATELET
BASO%: 0.2 % (ref 0.0–2.0)
BASOS ABS: 0 10*3/uL (ref 0.0–0.1)
EOS ABS: 0 10*3/uL (ref 0.0–0.5)
EOS%: 0.4 % (ref 0.0–7.0)
HEMATOCRIT: 34.7 % — AB (ref 34.8–46.6)
HGB: 10.7 g/dL — ABNORMAL LOW (ref 11.6–15.9)
LYMPH#: 0.8 10*3/uL — AB (ref 0.9–3.3)
LYMPH%: 15.2 % (ref 14.0–49.7)
MCH: 27.4 pg (ref 25.1–34.0)
MCHC: 30.8 g/dL — AB (ref 31.5–36.0)
MCV: 89 fL (ref 79.5–101.0)
MONO#: 0.5 10*3/uL (ref 0.1–0.9)
MONO%: 10.7 % (ref 0.0–14.0)
NEUT#: 3.7 10*3/uL (ref 1.5–6.5)
NEUT%: 73.5 % (ref 38.4–76.8)
PLATELETS: 205 10*3/uL (ref 145–400)
RBC: 3.9 10*6/uL (ref 3.70–5.45)
RDW: 13.8 % (ref 11.2–14.5)
WBC: 5.1 10*3/uL (ref 3.9–10.3)

## 2017-01-21 LAB — COMPREHENSIVE METABOLIC PANEL
ALBUMIN: 3.5 g/dL (ref 3.5–5.0)
ALT: <6 U/L (ref 0–55)
AST: 20 U/L (ref 5–34)
Alkaline Phosphatase: 79 U/L (ref 40–150)
Anion Gap: 9 mEq/L (ref 3–11)
BUN: 14.8 mg/dL (ref 7.0–26.0)
CHLORIDE: 107 meq/L (ref 98–109)
CO2: 24 meq/L (ref 22–29)
Calcium: 9.1 mg/dL (ref 8.4–10.4)
Creatinine: 0.8 mg/dL (ref 0.6–1.1)
GLUCOSE: 83 mg/dL (ref 70–140)
POTASSIUM: 3.9 meq/L (ref 3.5–5.1)
SODIUM: 141 meq/L (ref 136–145)
Total Bilirubin: 0.22 mg/dL (ref 0.20–1.20)
Total Protein: 6.8 g/dL (ref 6.4–8.3)

## 2017-01-21 MED ORDER — PROCHLORPERAZINE EDISYLATE 5 MG/ML IJ SOLN
10.0000 mg | Freq: Once | INTRAMUSCULAR | Status: DC
Start: 2017-01-21 — End: 2017-01-21

## 2017-01-21 MED ORDER — PROCHLORPERAZINE MALEATE 10 MG PO TABS
ORAL_TABLET | ORAL | Status: AC
  Filled 2017-01-21: qty 1

## 2017-01-21 MED ORDER — BORTEZOMIB CHEMO SQ INJECTION 3.5 MG (2.5MG/ML)
1.5000 mg/m2 | Freq: Once | INTRAMUSCULAR | Status: AC
  Administered 2017-01-21: 2.5 mg via SUBCUTANEOUS
  Filled 2017-01-21: qty 2.5

## 2017-01-21 MED ORDER — PROCHLORPERAZINE MALEATE 10 MG PO TABS
10.0000 mg | ORAL_TABLET | Freq: Four times a day (QID) | ORAL | Status: DC | PRN
  Administered 2017-01-21: 10 mg via ORAL

## 2017-01-21 MED ORDER — PROCHLORPERAZINE MALEATE 10 MG PO TABS: 10.0000 mg | ORAL_TABLET | Freq: Four times a day (QID) | ORAL | 0 refills | Status: DC | PRN

## 2017-01-21 NOTE — Progress Notes (Signed)
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      10/10/2014 Miscellaneous    Peripheral blood chemistry and leukocytosis with total white count 78K, comprised of large plasma cells and his normocytic anemia. There is a myeloid left shift with previous surgical radium blasts. Flow cytometry showed 64% plasma cells      10/10/2014 Bone Marrow Biopsy    Markedly hypercellular marrow (95%), Atypical plasma cells comprise 57% of the cellularity. There was diminished multilineage in hematopoiesis with adequate maturation. Breasts less than 1%), no overt dysplasia of the myeloid or erythroid lineages.       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 was complicated by sepsis from Escherichia coli bacteremia and associated colitis, she was discharged home on 04/27/2015.      05/07/2015 - 05/12/2015 Hospital Admission    [facility], [facility]. ID workup was negative, EGD showed evidence of gastritis and duodenitis, no H. pylori or CMV.      05/20/2015  - 05/24/2015 Hospital Admission    [facility]      07/11/2015 Bone Marrow Biopsy    Post transplant 100 a bone marrow biopsy showed hypocellular marrow, 20%, no increase in plasma cells (2%) or other abnormalities.      08/22/2015 - 01/02/2016 Chemotherapy    MaintenanceCyborD (cytoxan 3074mm2 iv, bortezomib 1.5 mg/m, dexamethasone 40 mg, every 2 weeks, changed to Velcade maintenance after 4 months treatment      01/16/2016 - 04/19/2016 Chemotherapy    Maintenance Velcade 1.3 mg/m every 2 weeks      02/22/2016 Pathology Results    BONE MARROW: Diagnosis Bone Marrow, Aspirate,Biopsy, and Clot, right iliac - NORMOCELLULAR BONE MARROW FOR AGE WITH TRILINEAGE HEMATOPOIESIS. - PLASMACYTOSIS (PLASMA CELLS 12%). - SEE COMMENT. PERIPHERAL BLOOD: - OCCASIONAL CIRCULATING PLASMA CELLS.      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 female is here because of recently diagnosed plasma cell leukemia. [facility] and is here to establish her local oncological care with [place] is accompanied to the clinic by  her daughter and interpreter.  She presented to our hospital lth with worsening dyspnea, fatigue, cough, subjective fevers and chills, and was admitted on 09/14/2014. She was found to have allergy WBC 54.1K, hemoglobin 8.1, plt 310, she was treated with symptom management, and was discharged home on 09/17/2014, with a plan to follow-up with hematology. She represents to emergency room on 10/07/2014,  was found to have white count of 78K, and worsening anemia with hemoglobin 6. She was seen by my partner Dr. Jonette Eva and plasma cell leukemia was suspected. She was transferred to [facility] for further leukemia work-up and treatment. Bone marrow biopsy was done, which confirmed plasma cell leukemia, and she started chemotherapy with Velcade, Cytoxan and dexamethasone. She received weekly dose twice, last dose on 10/20/2014. She was discharged to home afterwards. She had a mediport placed during her hospitalization.  She has moderate fatigue, low appetite, she lost about 13 lbs in the past few months. She has moderate abdominal pain, 5-6/10, persistent, she does not take any pain meds.   I reviewed her medical records extensively, and discussed her case with Dr. Jorje Guild and [facility] program coordinator.  CURRENT THERAPY:   1. Velcade 1.47m/m2 and dexa 445mweekly 2. Daratumumab weekly started on 04/18/2016, changed to every 2 weeks on 06/25/2016 and changed to every 4 weeks after 10/13/2016, per protocol  3. zometa every 4 weeks started on 11/27/2015   INTERIM HISTORY:   Sheryl [name] is here for Velcade injection and a bone marrow biopsy.  Says her abdominal pain has resolved, no other complaints.  She feels well overall.  I saw her in the procedure room.  He is accompanied by her daughter and interpreter today.   MEDICAL HISTORY:  Past Medical History:  Diagnosis Date  . Chills with fever    intermittently since d/c from hospital  . Dysuria-frequency syndrome    w/ pink urine  . GERD (gastroesophageal reflux disease)   . Hepatitis   . History of positive PPD    DX 2011--  CXR DONE NO EVIDENCE  . History of ureter stent   . Hydronephrosis, right   . Neuromuscular disorder (H[facility])    legs numb intermittently  . 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  . Pneumonia   . Right ureteral stone   . Urosepsis 8/14   [facility]    SURGICAL HISTORY: Past Surgical History:  Procedure Laterality  Date  . CYSTOSCOPY W/ URETERAL STENT PLACEMENT Right 09/25/2012   Procedure: CYSTOSCOPY WITH RETROGRADE PYELOGRAM/URETERAL STENT PLACEMENT;  Surgeon: ThAlexis FrockMD;  Location: [facility];  Service: Urology;  Laterality: Right;  . CYSTOSCOPY WITH RETROGRADE PYELOGRAM, URETEROSCOPY AND STENT PLACEMENT Right 10/15/2012   Procedure: CYSTOSCOPY WITH RETROGRADE PYELOGRAM, URETEROSCOPY AND REMOVAL STENT WITH  STENT PLACEMENT;  Surgeon: ThAlexis FrockMD;  [facility];  Laterality: Right;  . ESOPHAGOGASTRODUODENOSCOPY (EGD) WITH PROPOFOL N/A 11/16/2014   Procedure: ESOPHAGOGASTRODUODENOSCOPY (EGD) WITH PROPOFOL;  Surgeon: DaMilus BanisterMD;  Location: [facility];  Service: Endoscopy;  Laterality: N/A;  . HOLMIUM LASER APPLICATION Right 9/0/35/5974 Procedure: HOLMIUM LASER APPLICATION;  Surgeon: ThAlexis FrockMD;  [facility];  Laterality: Right;  . LIVER BIOPSY    . OTHER SURGICAL HISTORY Right    removal of ovarian cyst  . removal of uterine cyst     years ago  . RIGHT VATS W/ DRAINAGE PEURAL EFFUSION AND BX'S  10-30-2008    SOCIAL  HISTORY: Social History   Socioeconomic History  . Marital status: Single    Spouse name: None  . Number of children: 3  . Years of education: None  . Highest education level: None  Social Needs  . Financial resource strain: None  . Food insecurity - worry: None  . Food insecurity - inability: None  . Transportation needs - medical: None  . Transportation needs - non-medical: None  Occupational History  . None  Tobacco Use  . Smoking status: Never Smoker  . Smokeless tobacco: Never Used  Substance and Sexual Activity  . Alcohol use: No    Alcohol/week: 0.0 oz  . Drug use: No  . Sexual activity: Not Currently    Birth control/protection: Abstinence  Other Topics Concern  . None  Social History Narrative  . None    FAMILY HISTORY: Family History  Problem  Relation Age of Onset  . Stomach cancer Mother   . Lung disease Father   . Asthma Father     ALLERGIES:  has No Known Allergies.  MEDICATIONS:  Current Outpatient Medications  Medication Sig Dispense Refill  . acetaminophen (TYLENOL) 325 MG tablet Take 2 tablets (650 mg total) by mouth every 6 (six) hours as needed for mild pain (or Fever >/= 101). 30 tablet 0  . acyclovir (ZOVIRAX) 800 MG tablet Take 1 tablet (800 mg total) by mouth 2 (two) times daily. 60 tablet 2  . dexamethasone (DECADRON) 4 MG tablet Take 10 tablets (40 mg total) by mouth once a week. 40 tablet 2  . metFORMIN (GLUCOPHAGE) 500 MG tablet Take 1 tablet (500 mg total) by mouth 2 (two) times daily with a meal. 60 tablet 5  . omeprazole (PRILOSEC) 20 MG capsule Take 1 capsule (20 mg total) by mouth daily. 30 capsule 5  . ondansetron (ZOFRAN) 8 MG tablet Take 1 tablet (8 mg total) by mouth every 8 (eight) hours as needed for nausea or vomiting. 30 tablet 1  . polyethylene glycol (MIRALAX / GLYCOLAX) packet Take 17 g by mouth daily as needed.    . prochlorperazine (COMPAZINE) 10 MG tablet Take 1 tablet (10 mg total) by mouth every 6 (six) hours as needed for nausea or vomiting. 30 tablet 0  . promethazine (PHENERGAN) 25 MG tablet Take 1 tablet (25 mg total) by mouth every 6 (six) hours as needed for nausea or vomiting. 30 tablet 0   Current Facility-Administered Medications  Medication Dose Route Frequency Provider Last Rate Last Dose  . prochlorperazine (COMPAZINE) injection 10 mg  10 mg Intravenous Once Truitt Merle, MD      . prochlorperazine (COMPAZINE) tablet 10 mg  10 mg Oral Q6H PRN Truitt Merle, MD   10 mg at 01/21/17 0915   Facility-Administered Medications Ordered in Other Visits  Medication Dose Route Frequency Provider Last Rate Last Dose  . sodium chloride 0.9 % injection 10 mL  10 mL Intravenous PRN Truitt Merle, MD   10 mL at 12/11/15 1532  . sodium chloride 0.9 % injection 10 mL  10 mL Intravenous PRN Truitt Merle, MD    10 mL at 06/11/16 1505  . sodium chloride flush (NS) 0.9 % injection 10 mL  10 mL Intravenous PRN Truitt Merle, MD   10 mL at 10/09/15 1717    REVIEW OF SYSTEMS:  Constitutional: no abnormal night sweats  Eyes: Denies blurriness of vision, double vision or watery eyes.  Ears, nose, mouth, throat, and face: Denies mucositis  Respiratory: Denies, dyspnea or wheezes  Cardiovascular: Denies palpitation, chest discomfort or lower extremity swelling Gastrointestinal:  Denies nausea, heartburn, reports regular bowel/bladder (+) occasional bloating  Skin: Denies abnormal skin rashes Lymphatics: Denies new lymphadenopathy or easy bruising Neurological:Denies numbness, tingling or new weaknesses Behavioral/Psych: Mood is stable, no new changes  All other systems were reviewed with the patient and are negative.  PHYSICAL EXAMINATION:   ECOG PERFORMANCE STATUS: 1 Blood pressure 108/81, heart rate 94, respiratory rate 18, temperature 36.8, pulse ox 97% on room air GENERAL:alert, no distress and comfortable SKIN: skin color, texture, turgor are normal, no rashes or significant lesions EYES: normal, conjunctiva are pink and non-injected, sclera clear OROPHARYNX:no exudate, no erythema and lips, buccal mucosa  NECK: supple, thyroid normal size, non-tender, without nodularity LYMPH:  no palpable lymphadenopathy in the cervical, axillary or inguinal LUNGS: clear to auscultation and percussion with normal breathing effort HEART: regular rate & rhythm and no murmurs and no lower extremity edema ABDOMEN:abdomen soft, moderate tenderness in the right upper quadrant of abdomen, no rebound pain, no Murphy sign, no hepatomegaly. Musculoskeletal:no cyanosis of digits and no clubbing  PSYCH: alert & oriented x 3 with fluent speech NEURO: no focal motor/sensory deficits  LABORATORY DATA:  I have reviewed the data as listed CBC Latest Ref Rng & Units 01/21/2017 01/15/2017 01/05/2017  WBC 3.9 - 10.3 10e3/uL 5.1  5.4 4.8  Hemoglobin 11.6 - 15.9 g/dL 10.7(L) 10.1(L) 9.8(L)  Hematocrit 34.8 - 46.6 % 34.7(L) 31.2(L) 30.4(L)  Platelets 145 - 400 10e3/uL 205 219 169    CMP Latest Ref Rng & Units 01/21/2017 01/15/2017 01/05/2017  Glucose 70 - 140 mg/dl 83 94 128  BUN 7.0 - 26.0 mg/dL 14.8 14.4 14.1  Creatinine 0.6 - 1.1 mg/dL 0.8 0.7 0.7  Sodium 136 - 145 mEq/L 141 140 141  Potassium 3.5 - 5.1 mEq/L 3.9 4.0 3.7  Chloride 101 - 111 mmol/L - - -  CO2 22 - 29 mEq/L _0 Calcium 8.4 - 10.4 mg/dL 9.1 9.1 9.2  Total Protein 6.4 - 8.3 g/dL 6.8 6.6 6.2(L)  Total Bilirubin 0.20 - 1.20 mg/dL 0.22 <0.22 <0.22  Alkaline Phos 40 - 150 U/L 79 83 78  AST 5 - 34 U/L _1 ALT 0-55 U/L U/L <_2 SPEP M-protein  09/15/2014: 4.2 10/08/14: 4.6 12/08/2014: 0.2 02/15/2015: not sufficient sample for test   09/11/2015: not observed 10/09/2015: not det   11/06/2015: not det  12/25/2015: not det  02/13/2016: 0.3 03/12/2016: 0.8 04/09/2016: 0.6 05/08/16: 0.1 06/04/16: 0.1 07/02/2016: 0.2 07/09/2016: 0.2 (dara specific IFE negative) 08/05/16: Not observed  09/01/16: Not observed 09/29/16: 0.1(dara specific IFE negative) 10/27/16: Not observed 11/24/16 : Not observed 12/23/16: 0.1 (Dara specific IFE showed IgG monoclonal protein with lambda light chain)   IgG mg/dl 11/03/2014: 3150  12/08/2014:  759 02/14/2014: 860 09/11/2015: 1791 10/09/2015: 1457 11/06/2015: 1504 12/25/2015: 1400 02/13/2016: 1543 03/12/2016: 1860 04/09/2016: 1620 05/08/16: 749 06/04/16: 751 5/30/20187: 796 07/09/2016: 701 08/05/16: 678 09/01/16: 650 09/29/16: 727 10/27/16: 634 11/24/16: 685  12/23/16: 714   Kappa/lambda light chains levels and ration  11/03/14: 0.75, 152, 0.00 12/22/2014: 1.10, 2.60, 0.42 02/15/2015: 9.59,14.35, 0.67 09/11/2015: 2.44, 2.72, 0.90 10/09/2015: 3.09, 3.49, 0.89 11/06/2015: 2.30, 2.62, 0.88 12/25/2015: 2.51, 3.0, 0.84 02/13/2016: 2.64, 15.3, 0.17 03/12/2016: 2.27, 45.5, 0.05 04/09/2016: 3.33, 45.5, 0.07 05/08/2016: 0.49, 1.21,  0.40 06/04/2016: 0.83, 1.11, 0.75 07/02/16: 0.51, 1.43, 0.36 07/23/16: 0.68, 1.02, 0.67 08/05/16: 0.62, 1.09, 0.57 09/01/16: 0.80, 1.21, 0.66 09/29/16: 0.44, 1.07, 0.41  10/27/16: 0.63, 1.44, 0.44 11/24/16: 0.83, 2.13, 0.39 12/23/16: 0.69, 2.31, 0.30   24 h urine UPEP/IFE and light chain: 11/03/2014: IFE showed a monoclonal IgG heavy chain with associated lambda light chain. M protein was Undetectable    PATHOLOGY   PATHOLOGY REPORT  Bone Marrow (BM) and Peripheral Blood (PB) FINAL PATHOLOGIC DIAGNOSIS  BONE MARROW: 07/11/2015 Hypocellular marrow (20%) with no increase in plasma cells (2%). See comment. PERIPHERAL BLOOD: Mild anemia. No circulating plasma cells identified. See comment and CBC data.  BONE MARROW: 02/22/2016 Diagnosis Bone Marrow, Aspirate,Biopsy, and Clot, right iliac - NORMOCELLULAR BONE MARROW FOR AGE WITH TRILINEAGE HEMATOPOIESIS. - PLASMACYTOSIS (PLASMA CELLS 12%). - SEE COMMENT. PERIPHERAL BLOOD: - OCCASIONAL CIRCULATING PLASMA CELLS.    PROCEDURES   Colonoscopy by Dr. Ardis Hughs on 11/26/16  IMPRESSION - One 2 mm polyp in the transverse colon, removed with a cold biopsy forceps. Resected and retrieved. - Small internal and external hemorrhoids. - The examination was otherwise normal on direct and retroflexion views.   RADIOGRAPHIC STUDIES: I have personally reviewed the radiological images as listed and agreed with the findings in the report. US Abdomen Complete  Result Date: 01/15/2017 CLINICAL DATA:  Upper abdominal pain.  History of hepatitis EXAM: ABDOMEN ULTRASOUND COMPLETE COMPARISON:  CT abdomen and pelvis May 20, 2015 and September 14, 2014 FINDINGS: Gallbladder: No gallstones or wall thickening visualized. There is no pericholecystic fluid. No sonographic Murphy sign noted by sonographer. Common bile duct: Diameter: 4 mm. No intrahepatic, common hepatic, or common bile duct dilatation. Liver: There is a cyst in the posterior segment of the right  lobe of the liver measuring 1.5 x 1.1 x 1.1 cm, also seen on prior CT. Within normal limits in parenchymal echogenicity. Portal vein is patent on color Doppler imaging with normal direction of blood flow towards the liver. IVC: No abnormality visualized. Pancreas: No mass or inflammatory focus. Spleen: Spleen is normal in size. There is a complex partially cystic but septated lesion arising from the periphery of the spleen measuring 6.7 x 3.2 x 3.1 cm, also seen on prior CT. Right Kidney: Length: 10.8 cm. Echogenicity within normal limits. No mass or hydronephrosis visualized. There is an apparent calculus in the lower pole right kidney measuring 8 x 6 mm. Left Kidney: Length: 11.0 cm. Echogenicity within normal limits. No mass or hydronephrosis visualized. There is a calculus in the lower pole left kidney measuring 1.5 cm. Abdominal aorta: No aneurysm visualized. Other findings: No demonstrable ascites. IMPRESSION: 1. Complex lesion along the periphery of the spleen, noted on prior CT and slightly smaller currently than on prior CT examination. Suspect prior partially liquified hematoma. Lymphangioma is a differential consideration. This lesion appears slightly smaller over time. 2.  Small cyst in liver. 3.  Bilateral renal calculi. 4.  Study otherwise unremarkable. Electronically Signed   By: Lowella Grip III M.D.   On: 01/15/2017 10:34     CXR 05/08/2016 IMPRESSION:  No acute disease.  CT Biopsy 02/22/16 IMPRESSION: 1. Technically successful CT guided right iliac bone core and aspiration biopsy.  ASSESSMENT & PLAN: 46 y.o. [place] woman, presented with anemia and leokocytosis   1. Abdominal pain  -Her previous CT scan in 2017 showed gallbladder wall thickening, no stones -She did have a history of abdominal pain, intermittent, had a EGD in 11/2014 which was normal  -Ultrasound of last week was negative -Her pain has resolved.  2. 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 Unfortunately this showed increased plasma cells 12% with additional lambda light chain staining pending. This is relapse of disease. Cytogenetics was normal  -Her M protein and lambda free light chain level has significantly increased lately, consistent with disease relapse -Her recent LP showed negative CSF, no evidence of CNS involvement. -I have previously spoken with Dr. Norma Fredrickson, we agreed to change her CyBorD to weekly, and add Daratumumab weekly, starting 04/18/16. Potential side effects would discussed with patient, especially cytopenia and infusion reaction, she will take dexamethasone the day before Dara infusion.  -If her disease does not respond to treatment quickly, or if she has intolerance issue to cyBorD, I will change to pomalidomide,  dexamethasone and Dara  -continue acyclovir  -Continue Zometa every 4 weeks for a total of 2 years (until 11/2017) - Due to her hospitalization for severe cytopenia and infection, Cytoxan has been held. -she has restarted weekly Velcade and dexa, and now on Dara every 4 weeks, tolerating well, will continue. Her recent dara-specific IFE was negative for M-protein on 07/07/16 at [facility], she has achieved second CR.  -She saw Dr. Norma Fredrickson in 12/2016 and he recommend repeating a bone marrow transplant and a bone survey. Plans to have  this in spring of 2019. I answered her transplant questions.  -She will proceed with bone marrow biopsy on 17th of this month in our clinic. I encouraged her to bring someone with her. Will do bone survey next week.  -If bone biopsy is clear of leukemia cells, we will continue with current therapy regimen.   -Her SPEP and IFE detected a small  amount M-protein again, the Dara specific immunofixation was positive, this is concerning for recurrence.  We will continue monitor her SPEP and immunofixation every months -She will continue her metformin, and weekly dexa and Velcade injection and monthly Dara infusion as she is responding well to treatment. She will also continue monthly Zometa injections. Next infusion will be moved to the 18th of Dec.  -She is scheduled for a bone marrow biopsy today   2. Type 2 diabetes, steroids induced  -She was noticed to have increased blood glucose level lately, with random blood glucose above 200. She also developed diabetic symptoms. No history of diabetes in the past. -This is likely steroids induced hyperglycemia -I strongly recommend her to avoid any soft drink and sweats, and watch her carbohydrate intake, and exercise more  -She has been seen by her primary care physician. She was not given any medication to manage her glucose. I will call her primary care physician to discuss adding metformin. -I previously instructed her to take metformin every day since she is now taking dexamethasone more frequently.  -She restarted her dexamethasone weekly as part of his leukemia treatment, and she will also receive steroids as premedication, I recommend her to take metformin 500 mg twice daily continuously, to control her hyperglycemia.Potential side effects discussed, she agreed to proceed. -Continue close monitoring. -We previously discussed restarting to metformin one pill a day while on steroids to see if her levels will stabilize. I previously encouraged her to take it in the morning with breakfast.  -Due to increase in steroids, Pt will increase metformin to twice daily. I previously  discussed with her, she voiced understanding and will change from  daily to twice daily.  -Her BG is now better controlled with metformin.   3. Chronic Hepatitis B  - She will follow-up with the GI clinic at  [facility] -continue entecavir, but she run out 2 weeks ago and has not refilled due to high copay.  Our nursing staff previously called her GI physician's office to request this for her.  - I previously encouraged her to call Dr at [facility] to get help with medication prices -She has not taken medication in 2-3 months.  -She is able to afford her $250 copay, but still not able to get in touch with GI clinic. My nurse has contacted [place] clinic at least twice to request refill for her but it has not been refilled.  - Pt has not gotten her medication refilled. I previously referred her to ID in [place]. She reports that they did not call her and she has not been scheduled for this. I will re-refer her for this.  -She has not been contacted by ID in [place],  But she will f/u with GI in [place] in 12/2016 if she needed. -She has not had f/u with ID for Hep B but is scheduled in 01/2017; has been off medication since July 2018. She will keep that appointment.   4. GERD, history of GI bleeding and nausea  -Continue omeprazole daily. We previously discussed steroids can worsen her acid reflux. -She previously had an EGD at [facility] in 2017. This was benign.  -she did not do her colonoscopy because she did not have anyone to accompany her during visit. She has recently seen Dr. Ardis Hughs again, and is scheduled for colonoscopy. -Colonoscopy on 11/26/16 per Dr. Ardis Hughs notable for a polyp in the transverse colon, revealed to be tubular adenoma and negative for high grade dysplasia or malignancy.    Plan  -we will proceed with bone marrow biopsy today and Velcade injection -She will return for Velcade injection next Monday -I plan to see her back on December 31 before Dara infusion   All questions were answered. The patient knows to call the clinic with any problems, questions or concerns.  I spent 15 minutes counseling the patient face to face.  The total time spent in the appointment  was 20 minutes and more than 50% was on counseling.      Truitt Merle, MD  01/21/2017

## 2017-01-21 NOTE — Patient Instructions (Signed)
Bone Marrow Aspiration and Bone Marrow Biopsy, Adult, Care After This sheet gives you information about how to care for yourself after your procedure. Your health care provider may also give you more specific instructions. If you have problems or questions, contact your health care provider. What can I expect after the procedure? After the procedure, it is common to have:  Mild pain and tenderness.  Swelling.  Bruising.  Follow these instructions at home:  Take over-the-counter or prescription medicines only as told by your health care provider.  Do not take baths, swim, or use a hot tub until your health care provider approves. Ask if you can take a shower or have a sponge bath.  Follow instructions from your health care provider about how to take care of the puncture site. Make sure you: ? Wash your hands with soap and water before you change your bandage (dressing). If soap and water are not available, use hand sanitizer. ? Change your dressing as told by your health care provider.  Check your puncture siteevery day for signs of infection. Check for: ? More redness, swelling, or pain. ? More fluid or blood. ? Warmth. ? Pus or a bad smell.  Return to your normal activities as told by your health care provider. Ask your health care provider what activities are safe for you.  Do not drive for 24 hours if you were given a medicine to help you relax (sedative).  Keep all follow-up visits as told by your health care provider. This is important. Contact a health care provider if:  You have more redness, swelling, or pain around the puncture site.  You have more fluid or blood coming from the puncture site.  Your puncture site feels warm to the touch.  You have pus or a bad smell coming from the puncture site.  You have a fever.  Your pain is not controlled with medicine. This information is not intended to replace advice given to you by your health care provider. Make sure  you discuss any questions you have with your health care provider. Document Released: 08/09/2004 Document Revised: 08/10/2015 Document Reviewed: 07/04/2015 Elsevier Interactive Patient Education  2018 [employer].

## 2017-01-21 NOTE — Progress Notes (Signed)
INDICATION: follow up plasma cell leukemia   Bone Marrow Biopsy and Aspiration Procedure Note   Informed consent was obtained and potential risks including bleeding, infection and pain were reviewed with the patient.  The patient's name, date of birth, identification, consent and allergies were verified prior to the start of procedure and time out was performed.  The left posterior iliac crest was chosen as the site of biopsy.  The skin was prepped with ChloraPrep.   8 cc of 2% lidocaine was used to provide local anaesthesia.   10 cc of bone marrow aspirate was obtained followed by 1cm biopsy.  Pressure was applied to the biopsy site and bandage was placed over the biopsy site. Patient was made to lie on the back for 15 mins prior to discharge.  The procedure was tolerated well. COMPLICATIONS: None BLOOD LOSS: none The patient was discharged home in stable condition with a 1 week follow up to review results.  Patient was provided with post bone marrow biopsy instructions and instructed to call if there was any bleeding or worsening pain.  Specimens sent for flow cytometry, cytogenetics and additional studies.  Signed Scot Dock, NP  Addendum  I presented and supervised the biopsy today.   Truitt Merle  01/21/2017

## 2017-01-21 NOTE — Progress Notes (Signed)
Patient 30 minutes status post bone marrow biopsy.  Site with dressing is dry and intact.  Proceeding with chemo treatment.  VSS

## 2017-01-21 NOTE — Progress Notes (Signed)
Patient's dressing for bone marrow biopsy check and is dry and intact.  Instructed patient via interpreter to leave bandage in place for 24 hours.  Patient verbalized understanding.  Instructions printed as well

## 2017-01-22 LAB — IFE, DARA-SPECIFIC, SERUM
IGA/IMMUNOGLOBULIN A, SERUM: 35 mg/dL — AB (ref 87–352)
IGM (IMMUNOGLOBIN M), SRM: 29 mg/dL (ref 26–217)
IgG, Qn, Serum: 709 mg/dL (ref 700–1600)

## 2017-01-22 LAB — KAPPA/LAMBDA LIGHT CHAINS
Ig Kappa Free Light Chain: 7.7 mg/L (ref 3.3–19.4)
Ig Lambda Free Light Chain: 30.8 mg/L — ABNORMAL HIGH (ref 5.7–26.3)
Kappa/Lambda FluidC Ratio: 0.25 — ABNORMAL LOW (ref 0.26–1.65)

## 2017-01-23 LAB — PROTEIN ELECTROPHORESIS, SERUM
A/G RATIO SPE: 1.2 (ref 0.7–1.7)
ALBUMIN: 3.4 g/dL (ref 2.9–4.4)
ALPHA 1: 0.2 g/dL (ref 0.0–0.4)
Alpha 2: 1 g/dL (ref 0.4–1.0)
Beta: 1 g/dL (ref 0.7–1.3)
Gamma Globulin: 0.7 g/dL (ref 0.4–1.8)
Globulin, Total: 2.9 g/dL (ref 2.2–3.9)
M-SPIKE, %: 0.1 g/dL — AB
TOTAL PROTEIN: 6.3 g/dL (ref 6.0–8.5)

## 2017-01-26 ENCOUNTER — Ambulatory Visit (HOSPITAL_BASED_OUTPATIENT_CLINIC_OR_DEPARTMENT_OTHER): Payer: Medicaid Other

## 2017-01-26 VITALS — BP 103/69 | HR 84 | Temp 98.1°F | Resp 16

## 2017-01-26 DIAGNOSIS — C9012 Plasma cell leukemia in relapse: Secondary | ICD-10-CM

## 2017-01-26 DIAGNOSIS — Z5112 Encounter for antineoplastic immunotherapy: Secondary | ICD-10-CM | POA: Diagnosis present

## 2017-01-26 DIAGNOSIS — Z95828 Presence of other vascular implants and grafts: Secondary | ICD-10-CM

## 2017-01-26 DIAGNOSIS — C9010 Plasma cell leukemia not having achieved remission: Secondary | ICD-10-CM

## 2017-01-26 MED ORDER — SODIUM CHLORIDE 0.9 % IJ SOLN
10.0000 mL | INTRAMUSCULAR | Status: DC | PRN
  Filled 2017-01-26: qty 10

## 2017-01-26 MED ORDER — PROCHLORPERAZINE EDISYLATE 5 MG/ML IJ SOLN: 10.0000 mg | Freq: Once | INTRAMUSCULAR | Status: DC

## 2017-01-26 MED ORDER — PROCHLORPERAZINE MALEATE 10 MG PO TABS
ORAL_TABLET | ORAL | Status: AC
  Filled 2017-01-26: qty 1

## 2017-01-26 MED ORDER — BORTEZOMIB CHEMO SQ INJECTION 3.5 MG (2.5MG/ML)
1.5000 mg/m2 | Freq: Once | INTRAMUSCULAR | Status: AC
  Administered 2017-01-26: 2.5 mg via SUBCUTANEOUS
  Filled 2017-01-26: qty 2.5

## 2017-01-26 MED ORDER — SODIUM CHLORIDE 0.9 % IV SOLN
Freq: Once | INTRAVENOUS | Status: AC
  Administered 2017-01-26: 15:00:00 via INTRAVENOUS

## 2017-01-26 MED ORDER — HEPARIN SOD (PORK) LOCK FLUSH 100 UNIT/ML IV SOLN
500.0000 [IU] | Freq: Once | INTRAVENOUS | Status: DC | PRN
  Filled 2017-01-26: qty 5

## 2017-01-26 MED ORDER — ZOLEDRONIC ACID 4 MG/100ML IV SOLN
4.0000 mg | Freq: Once | INTRAVENOUS | Status: AC
  Administered 2017-01-26: 4 mg via INTRAVENOUS
  Filled 2017-01-26: qty 100

## 2017-01-26 MED ORDER — PROCHLORPERAZINE MALEATE 10 MG PO TABS
10.0000 mg | ORAL_TABLET | Freq: Once | ORAL | Status: AC
  Administered 2017-01-26: 10 mg via ORAL

## 2017-01-26 NOTE — Patient Instructions (Signed)
[facility] Discharge Instructions for Patients Receiving Chemotherapy  Today you received the following chemotherapy agents Velcade.  To help prevent nausea and vomiting after your treatment, we encourage you to take your nausea medication as directed.  If you develop nausea and vomiting that is not controlled by your nausea medication, call the clinic.   BELOW ARE SYMPTOMS THAT SHOULD BE REPORTED IMMEDIATELY:  *FEVER GREATER THAN 100.5 F  *CHILLS WITH OR WITHOUT FEVER  NAUSEA AND VOMITING THAT IS NOT CONTROLLED WITH YOUR NAUSEA MEDICATION  *UNUSUAL SHORTNESS OF BREATH  *UNUSUAL BRUISING OR BLEEDING  TENDERNESS IN MOUTH AND THROAT WITH OR WITHOUT PRESENCE OF ULCERS  *URINARY PROBLEMS  *BOWEL PROBLEMS  UNUSUAL RASH Items with * indicate a potential emergency and should be followed up as soon as possible.  Feel free to call the clinic should you have any questions or concerns. The clinic phone number is [phone].  Please show the CHEMO ALERT CARD at check-in to the Emergency Department and triage nurse.   

## 2017-02-01 NOTE — Progress Notes (Signed)
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)   10/07/2014 Imaging    Abdominal ultrasound showed mild splenomegaly, stable perisplenic complex fluid collection unchanged since 08/27/2010.      10/10/2014 Miscellaneous    Peripheral blood chemistry and leukocytosis with total white count 78K, comprised of large plasma cells and his normocytic anemia. There is a myeloid left shift with previous surgical radium blasts. Flow cytometry showed 64% plasma cells      10/10/2014 Bone Marrow Biopsy    Markedly hypercellular marrow (95%), Atypical plasma cells comprise 57% of the cellularity. There was diminished multilineage in hematopoiesis with adequate maturation. Breasts less than 1%), no overt dysplasia of the myeloid or erythroid lineages.       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      05/07/2015 - 05/12/2015 Hospital Admission    [facility], [facility]. ID workup was negative, EGD showed evidence of gastritis and duodenitis, no H. pylori or CMV.        05/20/2015 - 05/24/2015 Hospital Admission    [facility]      07/11/2015 Bone Marrow Biopsy    Post transplant 100 a bone marrow biopsy showed hypocellular marrow, 20%, no increase in plasma cells (2%) or other abnormalities.      08/22/2015 - 01/02/2016 Chemotherapy    MaintenanceCyborD (cytoxan 3030mm2 iv, bortezomib 1.5 mg/m, dexamethasone 40 mg, every 2 weeks, changed to Velcade maintenance after 4 months treatment      01/16/2016 - 04/19/2016 Chemotherapy    Maintenance Velcade 1.3 mg/m every 2 weeks      02/22/2016 Pathology Results    BONE MARROW: Diagnosis Bone Marrow, Aspirate,Biopsy, and Clot, right iliac - NORMOCELLULAR BONE MARROW FOR AGE WITH TRILINEAGE HEMATOPOIESIS. - PLASMACYTOSIS (PLASMA CELLS 12%). - SEE COMMENT. PERIPHERAL BLOOD: - OCCASIONAL CIRCULATING PLASMA CELLS.      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 female is here because of recently diagnosed plasma cell leukemia. [facility] and is here to establish her local oncological care with [place] speak [place] is accompanied to  the clinic by her daughter and interpreter.  She presented to our hospital lth with worsening dyspnea, fatigue, cough, subjective fevers and chills, and was admitted on 09/14/2014. She was found to have allergy WBC 54.1K, hemoglobin 8.1, plt 310, she was treated with symptom management, and was discharged home on 09/17/2014, with a plan to follow-up with hematology. She represents to emergency room on  10/07/2014, was found to have white count of 78K, and worsening anemia with hemoglobin 6. She was seen by my partner Dr. Jonette Eva and plasma cell leukemia was suspected. She was transferred to [facility] for further leukemia work-up and treatment. Bone marrow biopsy was done, which confirmed plasma cell leukemia, and she started chemotherapy with Velcade, Cytoxan and dexamethasone. She received weekly dose twice, last dose on 10/20/2014. She was discharged to home afterwards. She had a mediport placed during her hospitalization.  She has moderate fatigue, low appetite, she lost about 13 lbs in the past few months. She has moderate abdominal pain, 5-6/10, persistent, she does not take any pain meds.   I reviewed her medical records extensively, and discussed her case with Dr. Jorje Guild and [place] program coordinator.  CURRENT THERAPY:   1. Velcade 1.43m/m2 and dexa 445mweekly 2. Daratumumab weekly started on 04/18/2016, changed to every 2 weeks on 06/25/2016 and changed to every 4 weeks after 10/13/2016, per protocol  3. zometa every 4 weeks started on 11/27/2015, plan for 2 years    INTERIM HISTORY:   Sheryl [name] here for Velcade injection and a follow up appointment. She is accompanied by a family member and a trOptometristShe denies any issues following her bone marrow biopsy. She will still complete a bone marrow transplant soon. Her follow up appointment with Dr. RoNorma Fredricksons in February 2019.  She notes that her blood sugars have been in the 80-90's respectively. She notes that she needs a refill on her diabetic medications.  Since her last visit to the office, she has underwent a bone marrow biopsy on 01/21/2017 with results showing: Bone Marrow, Aspirate, Biopsy, and Clot: NORMOCELLULAR BONE MARROW FOR AGE WITH TRILINEAGE HEMATOPOIESIS AND 2% PLASMA CELLS. PERIPHERAL BLOOD: NORMOCYTIC-HYPOCHROMIC ANEMIA.  On review of systems, pt reports back pain this week. She denies any other symptoms.     MEDICAL HISTORY:  Past Medical History:  Diagnosis Date  . Chills with fever    intermittently since d/c from hospital  . Dysuria-frequency syndrome    w/ pink urine  . GERD (gastroesophageal reflux disease)   . Hepatitis   . History of positive PPD    DX 2011--  CXR DONE NO EVIDENCE  . History of ureter stent   . Hydronephrosis, right   . Neuromuscular disorder ([facility])    legs numb intermittently  . 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  . Pneumonia   . Right ureteral stone   . Urosepsis 8/14   [facility]    SURGICAL HISTORY: Past Surgical History:  Procedure Laterality Date  . CYSTOSCOPY W/ URETERAL STENT PLACEMENT Right 09/25/2012   Procedure: CYSTOSCOPY WITH RETROGRADE PYELOGRAM/URETERAL STENT PLACEMENT;  Surgeon: ThAlexis FrockMD;  Location: [facility];  Service: Urology;  Laterality: Right;  . CYSTOSCOPY WITH RETROGRADE PYELOGRAM, URETEROSCOPY AND STENT PLACEMENT Right 10/15/2012   Procedure: CYSTOSCOPY WITH RETROGRADE PYELOGRAM, URETEROSCOPY AND REMOVAL STENT WITH  STENT PLACEMENT;  Surgeon: ThAlexis FrockMD;  [facility];  Laterality: Right;  . ESOPHAGOGASTRODUODENOSCOPY (EGD) WITH PROPOFOL N/A 11/16/2014   Procedure:  ESOPHAGOGASTRODUODENOSCOPY (EGD) WITH PROPOFOL;  Surgeon: Milus Banister, MD;  Location: [facility];  Service: Endoscopy;  Laterality: N/A;  . HOLMIUM LASER APPLICATION Right 0/02/7492   Procedure: HOLMIUM LASER APPLICATION;  Surgeon: Alexis Frock, MD;  Location[name];  Service: Urology;  Laterality: Right;  . LIVER BIOPSY    . OTHER SURGICAL HISTORY Right    removal of ovarian cyst  . removal of uterine cyst     years ago  . RIGHT VATS W/ DRAINAGE PEURAL EFFUSION AND BX'S  10-30-2008    SOCIAL HISTORY: Social History   Socioeconomic History  . Marital status: Single    Spouse name: None  . Number of children: 3  . Years of education: None  . Highest education level: None    Social Needs  . Financial resource strain: None  . Food insecurity - worry: None  . Food insecurity - inability: None  . Transportation needs - medical: None  . Transportation needs - non-medical: None  Occupational History  . None  Tobacco Use  . Smoking status: Never Smoker  . Smokeless tobacco: Never Used  Substance and Sexual Activity  . Alcohol use: No    Alcohol/week: 0.0 oz  . Drug use: No  . Sexual activity: Not Currently    Birth control/protection: Abstinence  Other Topics Concern  . None  Social History Narrative  . None    FAMILY HISTORY: Family History  Problem Relation Age of Onset  . Stomach cancer Mother   . Lung disease Father   . Asthma Father     ALLERGIES:  has No Known Allergies.  MEDICATIONS:  Current Outpatient Medications  Medication Sig Dispense Refill  . acetaminophen (TYLENOL) 325 MG tablet Take 2 tablets (650 mg total) by mouth every 6 (six) hours as needed for mild pain (or Fever >/= 101). 30 tablet 0  . acyclovir (ZOVIRAX) 800 MG tablet Take 1 tablet (800 mg total) by mouth 2 (two) times daily. 60 tablet 2  . dexamethasone (DECADRON) 4 MG tablet Take 10 tablets (40 mg total) by mouth once a week. 40 tablet 2  . metFORMIN (GLUCOPHAGE) 500 MG tablet Take 1 tablet (500 mg total) by mouth 2 (two) times daily with a meal. 60 tablet 2  . omeprazole (PRILOSEC) 20 MG capsule Take 1 capsule (20 mg total) by mouth daily. 30 capsule 5  . ondansetron (ZOFRAN) 8 MG tablet Take 1 tablet (8 mg total) by mouth every 8 (eight) hours as needed for nausea or vomiting. 30 tablet 1  . polyethylene glycol (MIRALAX / GLYCOLAX) packet Take 17 g by mouth daily as needed.    . prochlorperazine (COMPAZINE) 10 MG tablet Take 1 tablet (10 mg total) by mouth every 6 (six) hours as needed for nausea or vomiting. 30 tablet 0  . promethazine (PHENERGAN) 25 MG tablet Take 1 tablet (25 mg total) by mouth every 6 (six) hours as needed for nausea or vomiting. 30 tablet 0   No  current facility-administered medications for this visit.    Facility-Administered Medications Ordered in Other Visits  Medication Dose Route Frequency Provider Last Rate Last Dose  . sodium chloride 0.9 % injection 10 mL  10 mL Intravenous PRN Truitt Merle, MD   10 mL at 12/11/15 1532  . sodium chloride 0.9 % injection 10 mL  10 mL Intravenous PRN Truitt Merle, MD   10 mL at 06/11/16 1505  . sodium chloride flush (NS) 0.9 % injection 10 mL  10  mL Intravenous PRN Truitt Merle, MD   10 mL at 10/09/15 1717    REVIEW OF SYSTEMS:  Constitutional: no abnormal night sweats  Eyes: Denies blurriness of vision, double vision or watery eyes.  Ears, nose, mouth, throat, and face: Denies mucositis  Respiratory: Denies, dyspnea or wheezes  Cardiovascular: Denies palpitation, chest discomfort or lower extremity swelling Gastrointestinal:  Denies nausea, heartburn, reports regular bowel/bladder (+) occasional bloating  Skin: Denies abnormal skin rashes Musculoskeletal: (+) Back pain Lymphatics: Denies new lymphadenopathy or easy bruising Neurological:Denies numbness, tingling or new weaknesses Behavioral/Psych: Mood is stable, no new changes  All other systems were reviewed with the patient and are negative.  PHYSICAL EXAMINATION:   ECOG PERFORMANCE STATUS: 1 Vitals:   02/02/17 1449  BP: 108/67  Pulse: 90  Resp: 18  Temp: 98.4 F (36.9 C)  TempSrc: Oral  SpO2: 100%  Weight: 139 lb 11.2 oz (63.4 kg)  Height: _0  (1.575 m)  GENERAL:alert, no distress and comfortable SKIN: skin color, texture, turgor are normal, no rashes or significant lesions EYES: normal, conjunctiva are pink and non-injected, sclera clear OROPHARYNX:no exudate, no erythema and lips, buccal mucosa  NECK: supple, thyroid normal size, non-tender, without nodularity LYMPH:  no palpable lymphadenopathy in the cervical, axillary or inguinal LUNGS: clear to auscultation and percussion with normal breathing effort HEART: regular rate &  rhythm and no murmurs and no lower extremity edema ABDOMEN:abdomen soft, moderate tenderness in the right upper quadrant of abdomen, no rebound pain, no Murphy sign, no hepatomegaly. Musculoskeletal:no cyanosis of digits and no clubbing  PSYCH: alert & oriented x 3 with fluent speech NEURO: no focal motor/sensory deficits  LABORATORY DATA:  I have reviewed the data as listed CBC Latest Ref Rng & Units 02/02/2017 01/21/2017 01/15/2017  WBC 3.9 - 10.3 10e3/uL 6.9 5.1 5.4  Hemoglobin 11.6 - 15.9 g/dL 10.4(L) 10.7(L) 10.1(L)  Hematocrit 34.8 - 46.6 % 33.9(L) 34.7(L) 31.2(L)  Platelets 145 - 400 10e3/uL 172 205 219    CMP Latest Ref Rng & Units 02/02/2017 01/21/2017 01/21/2017  Glucose 70 - 140 mg/dl 121 83 -  BUN 7.0 - 26.0 mg/dL 13.8 14.8 -  Creatinine 0.6 - 1.1 mg/dL 0.8 0.8 -  Sodium 136 - 145 mEq/L 140 141 -  Potassium 3.5 - 5.1 mEq/L 4.0 3.9 -  Chloride 101 - 111 mmol/L - - -  CO2 22 - 29 mEq/L 25 24 -  Calcium 8.4 - 10.4 mg/dL 9.3 9.1 -  Total Protein 6.4 - 8.3 g/dL 6.9 6.8 6.3  Total Bilirubin 0.20 - 1.20 mg/dL <0.22 0.22 -  Alkaline Phos 40 - 150 U/L 98 79 -  AST 5 - 34 U/L 47(H) 20 -  ALT 0 - 55 U/L 23 <6 -    SPEP M-protein  09/15/2014: 4.2 10/08/14: 4.6 12/08/2014: 0.2 02/15/2015: not sufficient sample for test   09/11/2015: not observed 10/09/2015: not det   11/06/2015: not det  12/25/2015: not det  02/13/2016: 0.3 03/12/2016: 0.8 04/09/2016: 0.6 05/08/16: 0.1 06/04/16: 0.1 07/02/2016: 0.2 07/09/2016: 0.2 (dara specific IFE negative) 08/05/16: Not observed  09/01/16: Not observed 09/29/16: 0.1(dara specific IFE negative) 10/27/16: Not observed 11/24/16 : Not observed 12/23/16: 0.1 (Dara specific IFE showed IgG monoclonal protein with lambda light chain) 01/21/2017: 0.1 (Dara specific IFE showed IgG monoclonal protein with lambda light chain)   IgG mg/dl 11/03/2014: 3150  12/08/2014:  759 02/14/2014: 860 09/11/2015: 1347 10/09/2015: 1457 11/06/2015: 1504 12/25/2015:  1400 02/13/2016: 1543 03/12/2016: 1860 04/09/2016: 1620 05/08/16: 749 06/04/16:  751 5/30/20187: 796 07/09/2016: 701 08/05/16: 678 09/01/16: 650 09/29/16: 727 10/27/16: 634 11/24/16: 685  12/23/16: 714 01/21/2017: 709   Kappa/lambda light chains levels and ration  11/03/14: 0.75, 152, 0.00 12/22/2014: 1.10, 2.60, 0.42 02/15/2015: 9.59,14.35, 0.67 09/11/2015: 2.44, 2.72, 0.90 10/09/2015: 3.09, 3.49, 0.89 11/06/2015: 2.30, 2.62, 0.88 12/25/2015: 2.51, 3.0, 0.84 02/13/2016: 2.64, 15.3, 0.17 03/12/2016: 2.27, 45.5, 0.05 04/09/2016: 3.33, 45.5, 0.07 05/08/2016: 0.49, 1.21, 0.40 06/04/2016: 0.83, 1.11, 0.75 07/02/16: 0.51, 1.43, 0.36 07/23/16: 0.68, 1.02, 0.67 08/05/16: 0.62, 1.09, 0.57 09/01/16: 0.80, 1.21, 0.66 09/29/16: 0.44, 1.07, 0.41 10/27/16: 0.63, 1.44, 0.44 11/24/16: 0.83, 2.13, 0.39 12/23/16: 0.69, 2.31, 0.30 01/21/2017: 7.7, 30.8, 0.25  24 h urine UPEP/IFE and light chain: 11/03/2014: IFE showed a monoclonal IgG heavy chain with associated lambda light chain. M protein was Undetectable    PATHOLOGY   PATHOLOGY REPORT  Bone Marrow (BM) and Peripheral Blood (PB) FINAL PATHOLOGIC DIAGNOSIS  BONE MARROW: 07/11/2015 Hypocellular marrow (20%) with no increase in plasma cells (2%). See comment. PERIPHERAL BLOOD: Mild anemia. No circulating plasma cells identified. See comment and CBC data.  BONE MARROW: 02/22/2016 Diagnosis Bone Marrow, Aspirate,Biopsy, and Clot, right iliac - NORMOCELLULAR BONE MARROW FOR AGE WITH TRILINEAGE HEMATOPOIESIS. - PLASMACYTOSIS (PLASMA CELLS 12%). - SEE COMMENT. PERIPHERAL BLOOD: - OCCASIONAL CIRCULATING PLASMA CELLS.   PROCEDURES  Bone Marrow Biopsy, 01/21/17 Bone Marrow, Aspirate, Biopsy, and Clot - NORMOCELLULAR BONE MARROW FOR AGE WITH TRILINEAGE HEMATOPOIESIS AND 2% PLASMA CELLS. PERIPHERAL BLOOD: - NORMOCYTIC-HYPOCHROMIC ANEMIA.   Colonoscopy by Dr. Ardis Hughs on 11/26/16  IMPRESSION - One 2 mm polyp in the transverse colon, removed with a cold biopsy  forceps. Resected and retrieved. - Small internal and external hemorrhoids. - The examination was otherwise normal on direct and retroflexion views.   RADIOGRAPHIC STUDIES: I have personally reviewed the radiological images as listed and agreed with the findings in the report. US Abdomen Complete  Result Date: 01/15/2017 CLINICAL DATA:  Upper abdominal pain.  History of hepatitis EXAM: ABDOMEN ULTRASOUND COMPLETE COMPARISON:  CT abdomen and pelvis May 20, 2015 and September 14, 2014 FINDINGS: Gallbladder: No gallstones or wall thickening visualized. There is no pericholecystic fluid. No sonographic Murphy sign noted by sonographer. Common bile duct: Diameter: 4 mm. No intrahepatic, common hepatic, or common bile duct dilatation. Liver: There is a cyst in the posterior segment of the right lobe of the liver measuring 1.5 x 1.1 x 1.1 cm, also seen on prior CT. Within normal limits in parenchymal echogenicity. Portal vein is patent on color Doppler imaging with normal direction of blood flow towards the liver. IVC: No abnormality visualized. Pancreas: No mass or inflammatory focus. Spleen: Spleen is normal in size. There is a complex partially cystic but septated lesion arising from the periphery of the spleen measuring 6.7 x 3.2 x 3.1 cm, also seen on prior CT. Right Kidney: Length: 10.8 cm. Echogenicity within normal limits. No mass or hydronephrosis visualized. There is an apparent calculus in the lower pole right kidney measuring 8 x 6 mm. Left Kidney: Length: 11.0 cm. Echogenicity within normal limits. No mass or hydronephrosis visualized. There is a calculus in the lower pole left kidney measuring 1.5 cm. Abdominal aorta: No aneurysm visualized. Other findings: No demonstrable ascites. IMPRESSION: 1. Complex lesion along the periphery of the spleen, noted on prior CT and slightly smaller currently than on prior CT examination. Suspect prior partially liquified hematoma. Lymphangioma is a differential  consideration. This lesion appears slightly smaller over time. 2.  Small cyst in liver. 3.  Bilateral renal calculi. 4.  Study otherwise unremarkable. Electronically Signed   By: Lowella Grip III M.D.   On: 01/15/2017 10:34     CXR 05/08/2016 IMPRESSION:  No acute disease.  CT Biopsy 02/22/16 IMPRESSION: 1. Technically successful CT guided right iliac bone core and aspiration biopsy.  ASSESSMENT & PLAN: 46 y.o. [place] woman, presented with anemia and leokocytosis   1. 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 Unfortunately this showed increased plasma cells 12% with additional lambda light chain staining pending. This is relapse of disease. Cytogenetics was normal  -Her M protein and lambda free light chain level has significantly increased lately, consistent with disease relapse -Her recent LP showed negative CSF, no evidence of CNS involvement. -I have previously spoken with Dr. Norma Fredrickson, we agreed to change her CyBorD to weekly, and add Daratumumab weekly, starting 04/18/16. Potential side effects would discussed with patient, especially cytopenia and infusion reaction, she will take dexamethasone the day before Dara infusion.  -continue acyclovir  -Continue Zometa every 4 weeks for a total of 2 years (until 11/2017) - Due to her hospitalization for severe cytopenia and infection, Cytoxan has been held. -she has restarted weekly Velcade and dexa, and now on Dara every 4 weeks, tolerating well, will continue. Her recent dara-specific IFE was negative for M-protein on 07/07/16 at [facility], she has achieved second CR.  -She saw Dr. Norma Fredrickson in 12/2016 and he recommend repeating a bone marrow  transplant and a bone survey. Plans to have  this in spring of 2019.  -Her SPEP and IFE detected a small amount M-protein again, the Dara specific immunofixation was positive, this is concerning for recurrence.  We will continue monitor her SPEP and immunofixation every months -Bone Marrow Biopsy on 01/21/2017 showed 2% of plasma cells, monoclonal, no definitive evidence of plasma neoplasm.  It confirmed complete response. --She will continue weekly dexa and Velcade injection and monthly Dara infusion as she is responding well to treatment. She will also continue monthly Zometa injections.  -Follow-up in 4 weeks  2. Type 2 diabetes, steroids induced  -She was noticed to have increased blood glucose level lately, with random blood glucose above 200. She also developed diabetic symptoms. No history of diabetes in the past. -This is likely steroids induced hyperglycemia -I strongly recommend her to avoid any soft drink and sweats, and watch her carbohydrate intake, and exercise more  -She has been seen by her primary care physician. She was not given any medication to manage her glucose. I will call her primary care physician to discuss adding metformin. -I previously instructed her to take metformin every day since she is now taking dexamethasone more frequently.  -She restarted her dexamethasone weekly as part of his leukemia treatment, and she will also receive steroids as premedication, I recommend her to take metformin 500 mg twice daily continuously, to control her hyperglycemia.Potential side effects discussed, she agreed to proceed. -Continue close monitoring. -We previously discussed restarting to metformin one pill a day while on steroids to see if her levels will stabilize. I previously encouraged her to take it in the morning with breakfast.  -Due to increase in steroids, Pt will increase metformin to twice daily. I previously  discussed with her, she voiced  understanding and will change from  daily to twice daily.  -Her BG is now better controlled with metformin.   3. Chronic Hepatitis B  - She will follow-up with the GI clinic at [facility] -continue entecavir, but she run out 2 weeks ago and has not refilled due to high copay.  Our nursing staff previously called her GI physician's office to request this for her.  - I previously encouraged her to call Dr at [facility] to get help with medication prices -She has not taken medication in 2-3 months.  -She is able to afford her $250 copay, but still not able to get in touch with GI clinic. My nurse has contacted [place] clinic at least twice to request refill for her but it has not been refilled.  - Pt has not gotten her medication refilled. I previously referred her to ID in [place]. She reports that they did not call her and she has not been scheduled for this. I will re-refer her for this.  -She has not been contacted by ID in [place],  But she will f/u with GI in [place] in 12/2016 if she needed. -She has not had f/u with ID for Hep B but is scheduled in 01/2017; has been off medication since July 2018. She will keep that appointment.   4. GERD, history of GI bleeding and nausea  -Continue omeprazole daily. We previously discussed steroids can worsen her acid reflux. -She previously had an EGD at [facility] in 2017. This was benign.  -she did not do her colonoscopy because she did not have anyone to accompany her during visit. She has recently seen Dr. Ardis Hughs again, and is scheduled for colonoscopy. -Colonoscopy on 11/26/16 per Dr. Ardis Hughs notable for a polyp in the transverse colon, revealed to be tubular adenoma and negative for high grade dysplasia or malignancy.   5. Abdominal pain  -Her previous CT scan in 2017 showed gallbladder wall thickening, no stones -She did have a history of abdominal pain, intermittent, had a EGD in 11/2014 which was normal  -Ultrasound in 01/2017 was negative -Her pain has  resolved.  Plan   -Velcade injection weekly X6 -Lab, flush in 2, 4 and 6 weeks -zometa, Dara infusion and F/u in 4 weeks  -Refill Metformin and acyclovir.    All questions were answered. The patient knows to call the clinic with any problems, questions or concerns.  I spent 25 minutes counseling the patient face to face.  The total time spent in the appointment was 30 minutes and more than 50% was on counseling.      Truitt Merle, MD  02/02/2017   This document serves as a record of services personally performed by Truitt Merle, MD. It was created on her behalf by Steva Colder, a trained medical scribe. The creation of this record is based on the scribe's personal observations and the provider's statements to them.   I have reviewed the above documentation for accuracy and completeness, and I agree with the above.

## 2017-02-02 ENCOUNTER — Telehealth: Payer: Self-pay | Admitting: Hematology

## 2017-02-02 ENCOUNTER — Ambulatory Visit (HOSPITAL_BASED_OUTPATIENT_CLINIC_OR_DEPARTMENT_OTHER): Payer: Medicaid Other

## 2017-02-02 ENCOUNTER — Other Ambulatory Visit (HOSPITAL_BASED_OUTPATIENT_CLINIC_OR_DEPARTMENT_OTHER): Payer: Medicaid Other

## 2017-02-02 ENCOUNTER — Telehealth: Payer: Self-pay | Admitting: *Deleted

## 2017-02-02 ENCOUNTER — Encounter: Payer: Self-pay | Admitting: Hematology

## 2017-02-02 ENCOUNTER — Ambulatory Visit (HOSPITAL_BASED_OUTPATIENT_CLINIC_OR_DEPARTMENT_OTHER): Payer: Medicaid Other | Admitting: Hematology

## 2017-02-02 VITALS — BP 109/68 | HR 78 | Temp 98.3°F | Resp 16

## 2017-02-02 DIAGNOSIS — E099 Drug or chemical induced diabetes mellitus without complications: Secondary | ICD-10-CM

## 2017-02-02 DIAGNOSIS — C9012 Plasma cell leukemia in relapse: Secondary | ICD-10-CM

## 2017-02-02 DIAGNOSIS — C9010 Plasma cell leukemia not having achieved remission: Secondary | ICD-10-CM

## 2017-02-02 DIAGNOSIS — Z5112 Encounter for antineoplastic immunotherapy: Secondary | ICD-10-CM | POA: Diagnosis not present

## 2017-02-02 DIAGNOSIS — K219 Gastro-esophageal reflux disease without esophagitis: Secondary | ICD-10-CM | POA: Diagnosis not present

## 2017-02-02 DIAGNOSIS — B181 Chronic viral hepatitis B without delta-agent: Secondary | ICD-10-CM

## 2017-02-02 LAB — COMPREHENSIVE METABOLIC PANEL
ALT: 23 U/L (ref 0–55)
AST: 47 U/L — ABNORMAL HIGH (ref 5–34)
Albumin: 3.5 g/dL (ref 3.5–5.0)
Alkaline Phosphatase: 98 U/L (ref 40–150)
Anion Gap: 8 mEq/L (ref 3–11)
BUN: 13.8 mg/dL (ref 7.0–26.0)
CHLORIDE: 107 meq/L (ref 98–109)
CO2: 25 meq/L (ref 22–29)
Calcium: 9.3 mg/dL (ref 8.4–10.4)
Creatinine: 0.8 mg/dL (ref 0.6–1.1)
GLUCOSE: 121 mg/dL (ref 70–140)
Potassium: 4 mEq/L (ref 3.5–5.1)
SODIUM: 140 meq/L (ref 136–145)
TOTAL PROTEIN: 6.9 g/dL (ref 6.4–8.3)

## 2017-02-02 LAB — CBC WITH DIFFERENTIAL/PLATELET
BASO%: 0.1 % (ref 0.0–2.0)
BASOS ABS: 0 10*3/uL (ref 0.0–0.1)
EOS ABS: 0 10*3/uL (ref 0.0–0.5)
EOS%: 0 % (ref 0.0–7.0)
HCT: 33.9 % — ABNORMAL LOW (ref 34.8–46.6)
HGB: 10.4 g/dL — ABNORMAL LOW (ref 11.6–15.9)
LYMPH#: 0.7 10*3/uL — AB (ref 0.9–3.3)
LYMPH%: 10.5 % — ABNORMAL LOW (ref 14.0–49.7)
MCH: 26.9 pg (ref 25.1–34.0)
MCHC: 30.7 g/dL — AB (ref 31.5–36.0)
MCV: 87.6 fL (ref 79.5–101.0)
MONO#: 0.1 10*3/uL (ref 0.1–0.9)
MONO%: 2 % (ref 0.0–14.0)
NEUT%: 87.4 % — ABNORMAL HIGH (ref 38.4–76.8)
NEUTROS ABS: 6.1 10*3/uL (ref 1.5–6.5)
Platelets: 172 10*3/uL (ref 145–400)
RBC: 3.87 10*6/uL (ref 3.70–5.45)
RDW: 14.1 % (ref 11.2–14.5)
WBC: 6.9 10*3/uL (ref 3.9–10.3)

## 2017-02-02 MED ORDER — METFORMIN HCL 500 MG PO TABS: 500.0000 mg | ORAL_TABLET | Freq: Two times a day (BID) | ORAL | 2 refills | Status: DC

## 2017-02-02 MED ORDER — DIPHENHYDRAMINE HCL 25 MG PO CAPS
25.0000 mg | ORAL_CAPSULE | Freq: Once | ORAL | Status: AC
  Administered 2017-02-02: 25 mg via ORAL

## 2017-02-02 MED ORDER — PROCHLORPERAZINE MALEATE 10 MG PO TABS
10.0000 mg | ORAL_TABLET | Freq: Once | ORAL | Status: AC
  Administered 2017-02-02: 10 mg via ORAL

## 2017-02-02 MED ORDER — METHYLPREDNISOLONE SODIUM SUCC 125 MG IJ SOLR
INTRAMUSCULAR | Status: AC
  Filled 2017-02-02: qty 2

## 2017-02-02 MED ORDER — DIPHENHYDRAMINE HCL 25 MG PO CAPS
ORAL_CAPSULE | ORAL | Status: AC
  Filled 2017-02-02: qty 1

## 2017-02-02 MED ORDER — ACETAMINOPHEN 325 MG PO TABS
ORAL_TABLET | ORAL | Status: AC
  Filled 2017-02-02: qty 2

## 2017-02-02 MED ORDER — SODIUM CHLORIDE 0.9 % IV SOLN
Freq: Once | INTRAVENOUS | Status: AC
  Administered 2017-02-02: 16:00:00 via INTRAVENOUS

## 2017-02-02 MED ORDER — METHYLPREDNISOLONE SODIUM SUCC 125 MG IJ SOLR
125.0000 mg | Freq: Once | INTRAMUSCULAR | Status: AC
  Administered 2017-02-02: 125 mg via INTRAVENOUS

## 2017-02-02 MED ORDER — ACETAMINOPHEN 325 MG PO TABS
650.0000 mg | ORAL_TABLET | Freq: Once | ORAL | Status: AC
  Administered 2017-02-02: 650 mg via ORAL

## 2017-02-02 MED ORDER — SODIUM CHLORIDE 0.9% FLUSH
10.0000 mL | INTRAVENOUS | Status: DC | PRN
  Administered 2017-02-02: 10 mL
  Filled 2017-02-02: qty 10

## 2017-02-02 MED ORDER — BORTEZOMIB CHEMO SQ INJECTION 3.5 MG (2.5MG/ML)
1.5000 mg/m2 | Freq: Once | INTRAMUSCULAR | Status: AC
  Administered 2017-02-02: 2.5 mg via SUBCUTANEOUS
  Filled 2017-02-02: qty 2.5

## 2017-02-02 MED ORDER — SODIUM CHLORIDE 0.9 % IV SOLN
16.0000 mg/kg | Freq: Once | INTRAVENOUS | Status: AC
  Administered 2017-02-02: 900 mg via INTRAVENOUS
  Filled 2017-02-02: qty 40

## 2017-02-02 MED ORDER — HEPARIN SOD (PORK) LOCK FLUSH 100 UNIT/ML IV SOLN
500.0000 [IU] | Freq: Once | INTRAVENOUS | Status: AC | PRN
  Administered 2017-02-02: 500 [IU]
  Filled 2017-02-02: qty 5

## 2017-02-02 MED ORDER — PROCHLORPERAZINE MALEATE 10 MG PO TABS: 10.0000 mg | ORAL_TABLET | Freq: Once | ORAL | Status: DC

## 2017-02-02 MED ORDER — PROCHLORPERAZINE MALEATE 10 MG PO TABS
ORAL_TABLET | ORAL | Status: AC
  Filled 2017-02-02: qty 1

## 2017-02-02 MED ORDER — ACYCLOVIR 800 MG PO TABS: 800.0000 mg | ORAL_TABLET | Freq: Two times a day (BID) | ORAL | 2 refills | Status: DC

## 2017-02-02 NOTE — Telephone Encounter (Signed)
Copy of [facility] report faxed to Dr Janifer Adie @ [phone] per Dr Ernestina Penna request.

## 2017-02-02 NOTE — Patient Instructions (Signed)
[facility] Discharge Instructions for Patients Receiving Chemotherapy  Today you received the following chemotherapy agents: Darzalex and Velcade.  To help prevent nausea and vomiting after your treatment, we encourage you to take your nausea medication as prescribed.   If you develop nausea and vomiting that is not controlled by your nausea medication, call the clinic.   BELOW ARE SYMPTOMS THAT SHOULD BE REPORTED IMMEDIATELY:  *FEVER GREATER THAN 100.5 F  *CHILLS WITH OR WITHOUT FEVER  NAUSEA AND VOMITING THAT IS NOT CONTROLLED WITH YOUR NAUSEA MEDICATION  *UNUSUAL SHORTNESS OF BREATH  *UNUSUAL BRUISING OR BLEEDING  TENDERNESS IN MOUTH AND THROAT WITH OR WITHOUT PRESENCE OF ULCERS  *URINARY PROBLEMS  *BOWEL PROBLEMS  UNUSUAL RASH Items with * indicate a potential emergency and should be followed up as soon as possible.  Feel free to call the clinic should you have any questions or concerns. The clinic phone number is [phone].  Please show the [facility] at check-in to the Emergency Department and triage nurse.

## 2017-02-02 NOTE — Telephone Encounter (Signed)
Gave patient avs and calendar with appts added per 12/31 los.

## 2017-02-09 ENCOUNTER — Other Ambulatory Visit: Payer: Self-pay | Admitting: *Deleted

## 2017-02-09 ENCOUNTER — Inpatient Hospital Stay: Payer: Medicaid Other | Attending: Hematology

## 2017-02-09 ENCOUNTER — Ambulatory Visit (HOSPITAL_BASED_OUTPATIENT_CLINIC_OR_DEPARTMENT_OTHER): Payer: Medicaid Other | Admitting: Medical

## 2017-02-09 ENCOUNTER — Other Ambulatory Visit: Payer: Self-pay | Admitting: Medical

## 2017-02-09 ENCOUNTER — Inpatient Hospital Stay: Payer: Medicaid Other

## 2017-02-09 ENCOUNTER — Other Ambulatory Visit: Payer: Self-pay | Admitting: Hematology

## 2017-02-09 ENCOUNTER — Ambulatory Visit (HOSPITAL_COMMUNITY)
Admission: RE | Admit: 2017-02-09 | Discharge: 2017-02-09 | Disposition: A | Discharge status: Home / Self Care | Payer: Medicaid Other | Source: Ambulatory Visit | Attending: Hematology | Admitting: Hematology

## 2017-02-09 ENCOUNTER — Encounter: Payer: Self-pay | Admitting: Hematology

## 2017-02-09 ENCOUNTER — Ambulatory Visit (HOSPITAL_BASED_OUTPATIENT_CLINIC_OR_DEPARTMENT_OTHER): Payer: Medicaid Other | Admitting: Hematology

## 2017-02-09 VITALS — BP 113/74 | HR 95 | Temp 99.1°F | Resp 16

## 2017-02-09 DIAGNOSIS — C9010 Plasma cell leukemia not having achieved remission: Secondary | ICD-10-CM

## 2017-02-09 DIAGNOSIS — T380X5D Adverse effect of glucocorticoids and synthetic analogues, subsequent encounter: Secondary | ICD-10-CM | POA: Diagnosis not present

## 2017-02-09 DIAGNOSIS — K219 Gastro-esophageal reflux disease without esophagitis: Secondary | ICD-10-CM | POA: Diagnosis not present

## 2017-02-09 DIAGNOSIS — E099 Drug or chemical induced diabetes mellitus without complications: Secondary | ICD-10-CM | POA: Diagnosis not present

## 2017-02-09 DIAGNOSIS — R740 Nonspecific elevation of levels of transaminase and lactic acid dehydrogenase [LDH]: Secondary | ICD-10-CM | POA: Diagnosis not present

## 2017-02-09 DIAGNOSIS — R630 Anorexia: Secondary | ICD-10-CM | POA: Insufficient documentation

## 2017-02-09 DIAGNOSIS — T380X5A Adverse effect of glucocorticoids and synthetic analogues, initial encounter: Secondary | ICD-10-CM | POA: Diagnosis not present

## 2017-02-09 DIAGNOSIS — B1910 Unspecified viral hepatitis B without hepatic coma: Secondary | ICD-10-CM | POA: Diagnosis not present

## 2017-02-09 DIAGNOSIS — J069 Acute upper respiratory infection, unspecified: Secondary | ICD-10-CM | POA: Diagnosis not present

## 2017-02-09 DIAGNOSIS — C9012 Plasma cell leukemia in relapse: Secondary | ICD-10-CM | POA: Insufficient documentation

## 2017-02-09 DIAGNOSIS — R7401 Elevation of levels of liver transaminase levels: Secondary | ICD-10-CM | POA: Insufficient documentation

## 2017-02-09 DIAGNOSIS — J181 Lobar pneumonia, unspecified organism: Secondary | ICD-10-CM

## 2017-02-09 DIAGNOSIS — B370 Candidal stomatitis: Secondary | ICD-10-CM

## 2017-02-09 DIAGNOSIS — R634 Abnormal weight loss: Secondary | ICD-10-CM | POA: Diagnosis not present

## 2017-02-09 DIAGNOSIS — R509 Fever, unspecified: Secondary | ICD-10-CM

## 2017-02-09 DIAGNOSIS — J189 Pneumonia, unspecified organism: Secondary | ICD-10-CM | POA: Insufficient documentation

## 2017-02-09 LAB — CBC WITH DIFFERENTIAL/PLATELET
Abs Granulocyte: 3.5 10*3/uL (ref 1.5–6.5)
BASOS PCT: 0 %
Basophils Absolute: 0 10*3/uL (ref 0.0–0.1)
EOS PCT: 0 %
Eosinophils Absolute: 0 10*3/uL (ref 0.0–0.5)
HCT: 30.5 % — ABNORMAL LOW (ref 34.8–46.6)
Hemoglobin: 9.9 g/dL — ABNORMAL LOW (ref 11.6–15.9)
LYMPHS ABS: 0.7 10*3/uL — AB (ref 0.9–3.3)
Lymphocytes Relative: 13 %
MCH: 27 pg (ref 25.1–34.0)
MCHC: 32.3 g/dL (ref 31.5–36.0)
MCV: 83.6 fL (ref 79.5–101.0)
MONOS PCT: 20 %
Monocytes Absolute: 1.1 10*3/uL — ABNORMAL HIGH (ref 0.1–0.9)
Neutro Abs: 3.5 10*3/uL (ref 1.5–6.5)
Neutrophils Relative %: 67 %
PLATELETS: 174 10*3/uL (ref 145–400)
RBC: 3.65 MIL/uL — ABNORMAL LOW (ref 3.70–5.45)
RDW: 14.9 % (ref 11.2–16.1)
WBC: 5.3 10*3/uL (ref 3.9–10.3)

## 2017-02-09 LAB — COMPREHENSIVE METABOLIC PANEL
ALT: 164 U/L — ABNORMAL HIGH (ref 0–55)
AST: 385 U/L (ref 5–34)
Albumin: 3.2 g/dL — ABNORMAL LOW (ref 3.5–5.0)
Alkaline Phosphatase: 193 U/L — ABNORMAL HIGH (ref 40–150)
Anion gap: 8 (ref 3–11)
BUN: 11 mg/dL (ref 7–26)
CO2: 26 mmol/L (ref 22–29)
Calcium: 8.9 mg/dL (ref 8.4–10.4)
Chloride: 107 mmol/L (ref 98–109)
Creatinine, Ser: 0.69 mg/dL (ref 0.60–1.10)
GFR calc Af Amer: >60 mL/min (ref >60)
GFR calc non Af Amer: >60 mL/min (ref >60)
Glucose, Bld: 80 mg/dL (ref 70–140)
Potassium: 3.9 mmol/L (ref 3.3–4.7)
Sodium: 141 mmol/L (ref 136–145)
Total Bilirubin: 0.4 mg/dL (ref 0.2–1.2)
Total Protein: 6.6 g/dL (ref 6.4–8.3)

## 2017-02-09 LAB — INFLUENZA PANEL BY PCR (TYPE A & B)
INFLAPCR: NEGATIVE
Influenza B By PCR: NEGATIVE

## 2017-02-09 MED ORDER — ACETAMINOPHEN 325 MG PO TABS
650.0000 mg | ORAL_TABLET | Freq: Once | ORAL | Status: AC
  Administered 2017-02-09: 650 mg via ORAL

## 2017-02-09 MED ORDER — PROCHLORPERAZINE MALEATE 10 MG PO TABS
ORAL_TABLET | ORAL | Status: AC
  Filled 2017-02-09: qty 1

## 2017-02-09 MED ORDER — HYDROCOD POLST-CPM POLST ER 10-8 MG/5ML PO SUER
5.0000 mL | Freq: Two times a day (BID) | ORAL | 0 refills | Status: DC | PRN
Start: 2017-02-09 — End: 2017-02-16

## 2017-02-09 MED ORDER — ACETAMINOPHEN 325 MG PO TABS
ORAL_TABLET | ORAL | Status: AC
  Filled 2017-02-09: qty 2

## 2017-02-09 MED ORDER — FLUCONAZOLE 100 MG PO TABS: 100.0000 mg | ORAL_TABLET | Freq: Every day | ORAL | 0 refills | Status: DC

## 2017-02-09 MED ORDER — AZITHROMYCIN 250 MG PO TABS: ORAL_TABLET | ORAL | 0 refills | Status: DC

## 2017-02-09 MED ORDER — LEVOFLOXACIN 750 MG PO TABS: 750.0000 mg | ORAL_TABLET | Freq: Every day | ORAL | 0 refills | Status: DC

## 2017-02-09 NOTE — Progress Notes (Signed)
Erroneous encounter

## 2017-02-09 NOTE — Progress Notes (Signed)
Reports cough x 1 week, face mask in place. Productive yellow sputum.Taking OTC cough medication. Sandi Mealy, PA notified. Will see in the infusion room.  Dr. Burr Medico to side to talk about lab results, Treatment canceled. Beth, RN obtained flu swab. Via w/c to radiology for chest xray, then to be discharged home.

## 2017-02-09 NOTE — Progress Notes (Signed)
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      10/10/2014 Miscellaneous    Peripheral blood chemistry and leukocytosis with total white count 78K, comprised of large plasma cells and his normocytic anemia. There is a myeloid left shift with previous surgical radium blasts. Flow cytometry showed 64% plasma cells      10/10/2014 Bone Marrow Biopsy    Markedly hypercellular marrow (95%), Atypical plasma cells comprise 57% of the cellularity. There was diminished multilineage in hematopoiesis with adequate maturation. Breasts less than 1%), no overt dysplasia of the myeloid or erythroid lineages.       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      05/07/2015 - 05/12/2015 Hospital Admission    [facility], [facility]. ID workup was negative, EGD showed evidence of gastritis and duodenitis, no H. pylori  or CMV.      05/20/2015 - 05/24/2015 Hospital Admission    [facility]      07/11/2015 Bone Marrow Biopsy    Post transplant 100 a bone marrow biopsy showed hypocellular marrow, 20%, no increase in plasma cells (2%) or other abnormalities.      08/22/2015 - 01/02/2016 Chemotherapy    MaintenanceCyborD (cytoxan 3062mm2 iv, bortezomib 1.5 mg/m, dexamethasone 40 mg, every 2 weeks, changed to Velcade maintenance after 4 months treatment      01/16/2016 - 04/19/2016 Chemotherapy    Maintenance Velcade 1.3 mg/m every 2 weeks      02/22/2016 Pathology Results    BONE MARROW: Diagnosis Bone Marrow, Aspirate,Biopsy, and Clot, right iliac - NORMOCELLULAR BONE MARROW FOR AGE WITH TRILINEAGE HEMATOPOIESIS. - PLASMACYTOSIS (PLASMA CELLS 12%). - SEE COMMENT. PERIPHERAL BLOOD: - OCCASIONAL CIRCULATING PLASMA CELLS.      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 female is here because of recently diagnosed plasma cell leukemia. [facility] and is here to establish her local oncological care with [place] is  accompanied to the clinic by her daughter and interpreter.  She presented to our hospital lth with worsening dyspnea, fatigue, cough, subjective fevers and chills, and was admitted on 09/14/2014. She was found to have allergy WBC 54.1K, hemoglobin 8.1, plt 310, she was treated with symptom management, and was discharged home on 09/17/2014, with a plan to follow-up with hematology. She represents to  emergency room on 10/07/2014, was found to have white count of 78K, and worsening anemia with hemoglobin 6. She was seen by my partner Dr. Jonette Eva and plasma cell leukemia was suspected. She was transferred to [facility] for further leukemia work-up and treatment. Bone marrow biopsy was done, which confirmed plasma cell leukemia, and she started chemotherapy with Velcade, Cytoxan and dexamethasone. She received weekly dose twice, last dose on 10/20/2014. She was discharged to home afterwards. She had a mediport placed during her hospitalization.  She has moderate fatigue, low appetite, she lost about 13 lbs in the past few months. She has moderate abdominal pain, 5-6/10, persistent, she does not take any pain meds.   I reviewed her medical records extensively, and discussed her case with Dr. Jorje Guild and [place] program coordinator.  CURRENT THERAPY:   1. Velcade 1.47m/m2 and dexa 432mweekly 2. Daratumumab weekly started on 04/18/2016, changed to every 2 weeks on 06/25/2016 and changed to every 4 weeks after 10/13/2016, per protocol  3. zometa every 4 weeks started on 11/27/2015, plan for 2 years    INTERIM HISTORY:   Sheryl [name] is here for Velcade injection, and I was called to see her due to her one-week history of nasal congestion, productive cough, and low-grade fever for a few days.  She started cold symptoms about a week ago, took over-the-counter and her symptoms improved.  However she has developed productive cough with greenish sputum, low-grade fever with T-max 100 at home yesterday, and significant malaise, chills today.  She denies any dyspnea, chest pain, or other symptoms.  MEDICAL HISTORY:  Past Medical History:  Diagnosis Date  . Chills with fever    intermittently since d/c from hospital  . Dysuria-frequency syndrome    w/ pink urine  . GERD (gastroesophageal reflux disease)   . Hepatitis   . History of positive PPD    DX 2011--  CXR DONE NO EVIDENCE  . History of  ureter stent   . Hydronephrosis, right   . Neuromuscular disorder (HCC)    legs numb intermittently  . 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  . Pneumonia   . Right ureteral stone   . Urosepsis 8/14   ad[facility]    SURGICAL HISTORY: Past Surgical History:  Procedure Laterality Date  . CYSTOSCOPY W/ URETERAL STENT PLACEMENT Right 09/25/2012   Procedure: CYSTOSCOPY WITH RETROGRADE PYELOGRAM/URETERAL STENT PLACEMENT;  Surgeon: ThAlexis FrockMD;  Location: [facility];  Service: Urology;  Laterality: Right;  . CYSTOSCOPY WITH RETROGRADE PYELOGRAM, URETEROSCOPY AND STENT PLACEMENT Right 10/15/2012   Procedure: CYSTOSCOPY WITH RETROGRADE PYELOGRAM, URETEROSCOPY AND REMOVAL STENT WITH  STENT PLACEMENT;  Surgeon: ThAlexis FrockMD;  [facility];  Laterality: Right;  . ESOPHAGOGASTRODUODENOSCOPY (EGD) WITH PROPOFOL N/A 11/16/2014   Procedure: ESOPHAGOGASTRODUODENOSCOPY (EGD) WITH PROPOFOL;  Surgeon: DaMilus BanisterMD;  Location: [facility];  Service: Endoscopy;  Laterality: N/A;  . HOLMIUM LASER APPLICATION Right 10/10/52/4920 Procedure: HOLMIUM LASER APPLICATION;  Surgeon: ThAlexis FrockMD;  [facility];  Laterality: Right;  . LIVER BIOPSY    .  OTHER SURGICAL HISTORY Right    removal of ovarian cyst  . removal of uterine cyst     years ago  . RIGHT VATS W/ DRAINAGE PEURAL EFFUSION AND BX'S  10-30-2008    SOCIAL HISTORY: Social History   Socioeconomic History  . Marital status: Single    Spouse name: None  . Number of children: 3  . Years of education: None  . Highest education level: None  Social Needs  . Financial resource strain: None  . Food insecurity - worry: None  . Food insecurity - inability: None  . Transportation needs - medical: None  . Transportation needs - non-medical: None  Occupational History  . None  Tobacco Use  . Smoking status: Never Smoker  . Smokeless tobacco: Never Used    Substance and Sexual Activity  . Alcohol use: No    Alcohol/week: 0.0 oz  . Drug use: No  . Sexual activity: Not Currently    Birth control/protection: Abstinence  Other Topics Concern  . None  Social History Narrative  . None    FAMILY HISTORY: Family History  Problem Relation Age of Onset  . Stomach cancer Mother   . Lung disease Father   . Asthma Father     ALLERGIES:  has No Known Allergies.  MEDICATIONS:  Current Outpatient Medications  Medication Sig Dispense Refill  . acetaminophen (TYLENOL) 325 MG tablet Take 2 tablets (650 mg total) by mouth every 6 (six) hours as needed for mild pain (or Fever >/= 101). 30 tablet 0  . acyclovir (ZOVIRAX) 800 MG tablet Take 1 tablet (800 mg total) by mouth 2 (two) times daily. 60 tablet 2  . chlorpheniramine-HYDROcodone (TUSSIONEX) 10-8 MG/5ML SUER Take 5 mLs by mouth every 12 (twelve) hours as needed for cough. 140 mL 0  . dexamethasone (DECADRON) 4 MG tablet Take 10 tablets (40 mg total) by mouth once a week. 40 tablet 2  . fluconazole (DIFLUCAN) 100 MG tablet Take 1 tablet (100 mg total) by mouth daily. 10 tablet 0  . levofloxacin (LEVAQUIN) 750 MG tablet Take 1 tablet (750 mg total) by mouth daily. 7 tablet 0  . metFORMIN (GLUCOPHAGE) 500 MG tablet Take 1 tablet (500 mg total) by mouth 2 (two) times daily with a meal. 60 tablet 2  . omeprazole (PRILOSEC) 20 MG capsule Take 1 capsule (20 mg total) by mouth daily. 30 capsule 5  . ondansetron (ZOFRAN) 8 MG tablet Take 1 tablet (8 mg total) by mouth every 8 (eight) hours as needed for nausea or vomiting. 30 tablet 1  . polyethylene glycol (MIRALAX / GLYCOLAX) packet Take 17 g by mouth daily as needed.    . prochlorperazine (COMPAZINE) 10 MG tablet Take 1 tablet (10 mg total) by mouth every 6 (six) hours as needed for nausea or vomiting. 30 tablet 0  . promethazine (PHENERGAN) 25 MG tablet Take 1 tablet (25 mg total) by mouth every 6 (six) hours as needed for nausea or vomiting. 30  tablet 0   No current facility-administered medications for this visit.    Facility-Administered Medications Ordered in Other Visits  Medication Dose Route Frequency Provider Last Rate Last Dose  . sodium chloride 0.9 % injection 10 mL  10 mL Intravenous PRN Truitt Merle, MD   10 mL at 12/11/15 1532  . sodium chloride 0.9 % injection 10 mL  10 mL Intravenous PRN Truitt Merle, MD   10 mL at 06/11/16 1505  . sodium chloride flush (NS) 0.9 % injection 10  mL  10 mL Intravenous PRN Truitt Merle, MD   10 mL at 10/09/15 1717    REVIEW OF SYSTEMS:  Constitutional: no abnormal night sweats  Eyes: Denies blurriness of vision, double vision or watery eyes.  Ears, nose, mouth, throat, and face: Denies mucositis  Respiratory: Denies, dyspnea or wheezes  Cardiovascular: Denies palpitation, chest discomfort or lower extremity swelling Gastrointestinal:  Denies nausea, heartburn, reports regular bowel/bladder (+) occasional bloating  Skin: Denies abnormal skin rashes Musculoskeletal: (+) Back pain Lymphatics: Denies new lymphadenopathy or easy bruising Neurological:Denies numbness, tingling or new weaknesses Behavioral/Psych: Mood is stable, no new changes  All other systems were reviewed with the patient and are negative.  PHYSICAL EXAMINATION:   ECOG PERFORMANCE STATUS: 2-3 Blood pressure 113/74, heart rate 95, temperature 37.3, pulse ox 100% on room air GENERAL: She was wrapped with multiple blankets, not in respiratory distress. SKIN: skin color, texture, turgor are normal, no rashes or significant lesions EYES: normal, conjunctiva are pink and non-injected, sclera clear OROPHARYNX:no exudate, no erythema and lips, buccal mucosa, (+) mild oral thrush  NECK: supple, thyroid normal size, non-tender, without nodularity LYMPH:  no palpable lymphadenopathy in the cervical, axillary or inguinal LUNGS: clear to auscultation and percussion with normal breathing effort HEART: regular rate & rhythm and no  murmurs and no lower extremity edema ABDOMEN:abdomen soft, moderate tenderness in the right upper quadrant of abdomen, no rebound pain, no Murphy sign, no hepatomegaly. Musculoskeletal:no cyanosis of digits and no clubbing  PSYCH: alert & oriented x 3 with fluent speech NEURO: no focal motor/sensory deficits  LABORATORY DATA:  I have reviewed the data as listed CBC Latest Ref Rng & Units 02/09/2017 02/02/2017 01/21/2017  WBC 3.9 - 10.3 K/uL 5.3 6.9 5.1  Hemoglobin 11.6 - 15.9 g/dL 9.9(L) 10.4(L) 10.7(L)  Hematocrit 34.8 - 46.6 % 30.5(L) 33.9(L) 34.7(L)  Platelets 145 - 400 K/uL 174 172 205    CMP Latest Ref Rng & Units 02/09/2017 02/02/2017 01/21/2017  Glucose 70 - 140 mg/dL 80 121 83  BUN 7 - 26 mg/dL 11 13.8 14.8  Creatinine 0.60 - 1.10 mg/dL 0.69 0.8 0.8  Sodium 136 - 145 mmol/L 141 140 141  Potassium 3.3 - 4.7 mmol/L 3.9 4.0 3.9  Chloride 98 - 109 mmol/L 107 - -  CO2 22 - 29 mmol/L '26 25 24  ' Calcium 8.4 - 10.4 mg/dL 8.9 9.3 9.1  Total Protein 6.4 - 8.3 g/dL 6.6 6.9 6.8  Total Bilirubin 0.2 - 1.2 mg/dL 0.4 <0.22 0.22  Alkaline Phos 40 - 150 U/L 193(H) 98 79  AST 5 - 34 U/L 385(HH) 47(H) 20  ALT 0 - 55 U/L 164(H) 23 <6    SPEP M-protein  09/15/2014: 4.2 10/08/14: 4.6 12/08/2014: 0.2 02/15/2015: not sufficient sample for test   09/11/2015: not observed 10/09/2015: not det   11/06/2015: not det  12/25/2015: not det  02/13/2016: 0.3 03/12/2016: 0.8 04/09/2016: 0.6 05/08/16: 0.1 06/04/16: 0.1 07/02/2016: 0.2 07/09/2016: 0.2 (dara specific IFE negative) 08/05/16: Not observed  09/01/16: Not observed 09/29/16: 0.1(dara specific IFE negative) 10/27/16: Not observed 11/24/16 : Not observed 12/23/16: 0.1 (Dara specific IFE showed IgG monoclonal protein with lambda light chain) 01/21/2017: 0.1 (Dara specific IFE showed IgG monoclonal protein with lambda light chain)   IgG mg/dl 11/03/2014: 3150  12/08/2014:  759 02/14/2014: 860 09/11/2015: 1347 10/09/2015: 1457 11/06/2015: 1504 12/25/2015:  1400 02/13/2016: 1543 03/12/2016: 1860 04/09/2016: 1620 05/08/16: 749 06/04/16: 751 5/30/20187: 796 07/09/2016: 701 08/05/16: 678 09/01/16: 650 09/29/16: 727 10/27/16: 634 11/24/16:  685  12/23/16: 714 01/21/2017: 709   Kappa/lambda light chains levels and ration  11/03/14: 0.75, 152, 0.00 12/22/2014: 1.10, 2.60, 0.42 02/15/2015: 9.59,14.35, 0.67 09/11/2015: 2.44, 2.72, 0.90 10/09/2015: 3.09, 3.49, 0.89 11/06/2015: 2.30, 2.62, 0.88 12/25/2015: 2.51, 3.0, 0.84 02/13/2016: 2.64, 15.3, 0.17 03/12/2016: 2.27, 45.5, 0.05 04/09/2016: 3.33, 45.5, 0.07 05/08/2016: 0.49, 1.21, 0.40 06/04/2016: 0.83, 1.11, 0.75 07/02/16: 0.51, 1.43, 0.36 07/23/16: 0.68, 1.02, 0.67 08/05/16: 0.62, 1.09, 0.57 09/01/16: 0.80, 1.21, 0.66 09/29/16: 0.44, 1.07, 0.41 10/27/16: 0.63, 1.44, 0.44 11/24/16: 0.83, 2.13, 0.39 12/23/16: 0.69, 2.31, 0.30 01/21/2017: 7.7, 30.8, 0.25  24 h urine UPEP/IFE and light chain: 11/03/2014: IFE showed a monoclonal IgG heavy chain with associated lambda light chain. M protein was Undetectable    PATHOLOGY   PATHOLOGY REPORT  Bone Marrow (BM) and Peripheral Blood (PB) FINAL PATHOLOGIC DIAGNOSIS  BONE MARROW: 07/11/2015 Hypocellular marrow (20%) with no increase in plasma cells (2%). See comment. PERIPHERAL BLOOD: Mild anemia. No circulating plasma cells identified. See comment and CBC data.  BONE MARROW: 02/22/2016 Diagnosis Bone Marrow, Aspirate,Biopsy, and Clot, right iliac - NORMOCELLULAR BONE MARROW FOR AGE WITH TRILINEAGE HEMATOPOIESIS. - PLASMACYTOSIS (PLASMA CELLS 12%). - SEE COMMENT. PERIPHERAL BLOOD: - OCCASIONAL CIRCULATING PLASMA CELLS.   PROCEDURES  Bone Marrow Biopsy, 01/21/17 Bone Marrow, Aspirate, Biopsy, and Clot - NORMOCELLULAR BONE MARROW FOR AGE WITH TRILINEAGE HEMATOPOIESIS AND 2% PLASMA CELLS. PERIPHERAL BLOOD: - NORMOCYTIC-HYPOCHROMIC ANEMIA.   Colonoscopy by Dr. Ardis Hughs on 11/26/16  IMPRESSION - One 2 mm polyp in the transverse colon, removed with a cold biopsy  forceps. Resected and retrieved. - Small internal and external hemorrhoids. - The examination was otherwise normal on direct and retroflexion views.   RADIOGRAPHIC STUDIES: I have personally reviewed the radiological images as listed and agreed with the findings in the report. Dg Chest 2 View  Result Date: 02/09/2017 CLINICAL DATA:  Productive cough and URI.  Evaluate for pneumonia. EXAM: CHEST  2 VIEW COMPARISON:  Chest x-ray dated May 11, 2016. FINDINGS: Unchanged right chest wall port catheter with the tip in the proximal right atrium. Stable borderline cardiomegaly. Normal pulmonary vascularity. Mild patchy opacity in the right lower lobe. No pleural effusion or pneumothorax. No acute osseous abnormality. IMPRESSION: Mild patchy opacity in the right lower lobe, which could reflect atelectasis or pneumonia. Electronically Signed   By: Titus Dubin M.D.   On: 02/09/2017 15:38   US Abdomen Complete  Result Date: 01/15/2017 CLINICAL DATA:  Upper abdominal pain.  History of hepatitis EXAM: ABDOMEN ULTRASOUND COMPLETE COMPARISON:  CT abdomen and pelvis May 20, 2015 and September 14, 2014 FINDINGS: Gallbladder: No gallstones or wall thickening visualized. There is no pericholecystic fluid. No sonographic Murphy sign noted by sonographer. Common bile duct: Diameter: 4 mm. No intrahepatic, common hepatic, or common bile duct dilatation. Liver: There is a cyst in the posterior segment of the right lobe of the liver measuring 1.5 x 1.1 x 1.1 cm, also seen on prior CT. Within normal limits in parenchymal echogenicity. Portal vein is patent on color Doppler imaging with normal direction of blood flow towards the liver. IVC: No abnormality visualized. Pancreas: No mass or inflammatory focus. Spleen: Spleen is normal in size. There is a complex partially cystic but septated lesion arising from the periphery of the spleen measuring 6.7 x 3.2 x 3.1 cm, also seen on prior CT. Right Kidney: Length: 10.8 cm.  Echogenicity within normal limits. No mass or hydronephrosis visualized. There is an apparent calculus in the lower pole right kidney measuring 8  x 6 mm. Left Kidney: Length: 11.0 cm. Echogenicity within normal limits. No mass or hydronephrosis visualized. There is a calculus in the lower pole left kidney measuring 1.5 cm. Abdominal aorta: No aneurysm visualized. Other findings: No demonstrable ascites. IMPRESSION: 1. Complex lesion along the periphery of the spleen, noted on prior CT and slightly smaller currently than on prior CT examination. Suspect prior partially liquified hematoma. Lymphangioma is a differential consideration. This lesion appears slightly smaller over time. 2.  Small cyst in liver. 3.  Bilateral renal calculi. 4.  Study otherwise unremarkable. Electronically Signed   By: Lowella Grip III M.D.   On: 01/15/2017 10:34     CXR 05/08/2016 IMPRESSION:  No acute disease.  CT Biopsy 02/22/16 IMPRESSION: 1. Technically successful CT guided right iliac bone core and aspiration biopsy.  ASSESSMENT & PLAN: 47 y.o. [place] woman, presented with anemia and leokocytosis    1. Right lower lobe pneumonia -She has developed productive cough, low-grade fever and chills -I obtained a chest x-ray, which showed mild opacity in the right lower lobe, possible pneumonia. Given her immunocompromised-status, will start her on Levaquin 750 mg daily for 7 days -She has no signs of hypoxia, exam was unremarkable, no need hospital admission. -I also checked influenza a and B which were negative today.   2. 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 Unfortunately this showed increased plasma cells 12%  with additional lambda light chain staining pending. This is relapse of disease. Cytogenetics was normal  -Her M protein and lambda free light chain level has significantly increased lately, consistent with disease relapse -Her recent LP showed negative CSF, no evidence of CNS involvement. -I have previously spoken with Dr. Norma Fredrickson, we agreed to change her CyBorD to weekly, and add Daratumumab weekly, starting 04/18/16. Potential side effects would discussed with patient, especially cytopenia and infusion reaction, she will take dexamethasone the day before Dara infusion.  -continue acyclovir  -Continue Zometa every 4 weeks for a total of 2 years (until 11/2017) - Due to her hospitalization for severe cytopenia and infection, Cytoxan has been held. -she has restarted weekly Velcade and dexa, and now on Dara every 4 weeks, tolerating well, will continue. Her recent dara-specific IFE was negative for M-protein on 07/07/16 at [facility], she has achieved second CR.  -She saw Dr. Norma Fredrickson in 12/2016 and he recommend repeating a bone marrow transplant and a bone survey. Plans to have  this in spring of 2019.  -Her SPEP and IFE detected a small amount M-protein again, the Dara specific immunofixation was positive, this is concerning for recurrence.  We will continue monitor her SPEP and immunofixation every months -Bone Marrow Biopsy on 01/21/2017 showed 2% of plasma cells, monoclonal, no definitive evidence of plasma neoplasm.  It confirmed complete response. --She will continue weekly dexa and Velcade injection and monthly Dara infusion as she is responding well to treatment. She will also continue monthly Zometa injections.  -Due to her acute infection, I will hold her dexamethasone and Velcade injection today  2. Type 2 diabetes, steroids induced  -She was noticed to have increased blood glucose level lately, with random blood glucose above 200. She also developed diabetic symptoms. No history of diabetes  in the past. -This  is likely steroids induced hyperglycemia -I strongly recommend her to avoid any soft drink and sweats, and watch her carbohydrate intake, and exercise more  -She has been seen by her primary care physician. She was not given any medication to manage her glucose. I will call her primary care physician to discuss adding metformin. -I previously instructed her to take metformin every day since she is now taking dexamethasone more frequently.  -She restarted her dexamethasone weekly as part of his leukemia treatment, and she will also receive steroids as premedication, I recommend her to take metformin 500 mg twice daily continuously, to control her hyperglycemia.Potential side effects discussed, she agreed to proceed. -Continue close monitoring. -We previously discussed restarting to metformin one pill a day while on steroids to see if her levels will stabilize. I previously encouraged her to take it in the morning with breakfast.  -Due to increase in steroids, Pt will increase metformin to twice daily. I previously  discussed with her, she voiced understanding and will change from daily to twice daily.  -Her BG is now better controlled with metformin.   3. Chronic Hepatitis B  - She will follow-up with the GI clinic at [facility] -continue entecavir, but she run out 2 weeks ago and has not refilled due to high copay.  Our nursing staff previously called her GI physician's office to request this for her.  - I previously encouraged her to call Dr at [facility] to get help with medication prices -She has not taken medication in 2-3 months.  -She is able to afford her $250 copay, but still not able to get in touch with GI clinic. My nurse has contacted [place] clinic at least twice to request refill for her but it has not been refilled.  - Pt has not gotten her medication refilled. I previously referred her to ID in [place]. She reports that they did not call her and she has not been  scheduled for this. I will re-refer her for this.  -She has not been contacted by ID in [place],  But she will f/u with GI in [place] in 12/2016 if she needed. -She has not had f/u with ID for Hep B but is scheduled in 01/2017; has been off medication since July 2018. She will keep that appointment.   4. GERD, history of GI bleeding and nausea  -Continue omeprazole daily. We previously discussed steroids can worsen her acid reflux. -She previously had an EGD at [facility] in 2017. This was benign.  -she did not do her colonoscopy because she did not have anyone to accompany her during visit. She has recently seen Dr. Ardis Hughs again, and is scheduled for colonoscopy. -Colonoscopy on 11/26/16 per Dr. Ardis Hughs notable for a polyp in the transverse colon, revealed to be tubular adenoma and negative for high grade dysplasia or malignancy.   5.  Transaminitis -Her liver enzymes have significantly elevated today, possibly related to her pneumonia -We will monitor closely.  Plan  -We will hold dexamethasone and Velcade today  -chest x-ray today -I called in Levaquin 750 mg daily for 7 days -She knows to call us if her symptom does not improve.  All questions were answered. The patient knows to call the clinic with any problems, questions or concerns.  Pt was also seen by symptom management clinic APP Lucianne Lei today. We discussed the management plan.   I spent 20 minutes counseling the patient face to face.  The total time spent in the appointment was  30 minutes and more than 50% was on counseling.      Truitt Merle, MD  02/09/2017

## 2017-02-10 ENCOUNTER — Encounter (HOSPITAL_COMMUNITY): Payer: Self-pay

## 2017-02-11 LAB — CHROMOSOME ANALYSIS, BONE MARROW

## 2017-02-16 ENCOUNTER — Inpatient Hospital Stay: Payer: Medicaid Other

## 2017-02-16 ENCOUNTER — Inpatient Hospital Stay (HOSPITAL_COMMUNITY)
Admission: AD | Admit: 2017-02-16 | Discharge: 2017-02-21 | DRG: 871 | Disposition: A | Discharge status: Home / Self Care | Payer: Medicaid Other | Source: Ambulatory Visit | Attending: Family Medicine | Admitting: Family Medicine

## 2017-02-16 ENCOUNTER — Other Ambulatory Visit: Payer: Self-pay

## 2017-02-16 ENCOUNTER — Encounter (HOSPITAL_COMMUNITY): Payer: Self-pay

## 2017-02-16 ENCOUNTER — Inpatient Hospital Stay (HOSPITAL_COMMUNITY): Payer: Medicaid Other

## 2017-02-16 ENCOUNTER — Ambulatory Visit (HOSPITAL_BASED_OUTPATIENT_CLINIC_OR_DEPARTMENT_OTHER): Payer: Medicaid Other | Admitting: Medical

## 2017-02-16 VITALS — BP 101/64 | HR 118 | Temp 99.5°F | Resp 20

## 2017-02-16 DIAGNOSIS — E860 Dehydration: Secondary | ICD-10-CM

## 2017-02-16 DIAGNOSIS — D61818 Other pancytopenia: Secondary | ICD-10-CM | POA: Diagnosis present

## 2017-02-16 DIAGNOSIS — T451X5A Adverse effect of antineoplastic and immunosuppressive drugs, initial encounter: Secondary | ICD-10-CM | POA: Diagnosis present

## 2017-02-16 DIAGNOSIS — N1330 Unspecified hydronephrosis: Secondary | ICD-10-CM | POA: Diagnosis not present

## 2017-02-16 DIAGNOSIS — R945 Abnormal results of liver function studies: Secondary | ICD-10-CM

## 2017-02-16 DIAGNOSIS — R509 Fever, unspecified: Secondary | ICD-10-CM

## 2017-02-16 DIAGNOSIS — N179 Acute kidney failure, unspecified: Secondary | ICD-10-CM | POA: Diagnosis present

## 2017-02-16 DIAGNOSIS — B181 Chronic viral hepatitis B without delta-agent: Secondary | ICD-10-CM

## 2017-02-16 DIAGNOSIS — C9010 Plasma cell leukemia not having achieved remission: Secondary | ICD-10-CM

## 2017-02-16 DIAGNOSIS — K219 Gastro-esophageal reflux disease without esophagitis: Secondary | ICD-10-CM

## 2017-02-16 DIAGNOSIS — J189 Pneumonia, unspecified organism: Secondary | ICD-10-CM | POA: Diagnosis not present

## 2017-02-16 DIAGNOSIS — R1011 Right upper quadrant pain: Secondary | ICD-10-CM

## 2017-02-16 DIAGNOSIS — K5900 Constipation, unspecified: Secondary | ICD-10-CM

## 2017-02-16 DIAGNOSIS — R7989 Other specified abnormal findings of blood chemistry: Secondary | ICD-10-CM | POA: Diagnosis present

## 2017-02-16 DIAGNOSIS — B1910 Unspecified viral hepatitis B without hepatic coma: Secondary | ICD-10-CM | POA: Diagnosis not present

## 2017-02-16 DIAGNOSIS — A419 Sepsis, unspecified organism: Principal | ICD-10-CM | POA: Diagnosis present

## 2017-02-16 DIAGNOSIS — R918 Other nonspecific abnormal finding of lung field: Secondary | ICD-10-CM | POA: Diagnosis not present

## 2017-02-16 DIAGNOSIS — C9012 Plasma cell leukemia in relapse: Secondary | ICD-10-CM | POA: Diagnosis present

## 2017-02-16 DIAGNOSIS — R630 Anorexia: Secondary | ICD-10-CM

## 2017-02-16 DIAGNOSIS — Z79899 Other long term (current) drug therapy: Secondary | ICD-10-CM

## 2017-02-16 DIAGNOSIS — B370 Candidal stomatitis: Secondary | ICD-10-CM | POA: Diagnosis present

## 2017-02-16 DIAGNOSIS — I10 Essential (primary) hypertension: Secondary | ICD-10-CM | POA: Diagnosis present

## 2017-02-16 DIAGNOSIS — T380X5A Adverse effect of glucocorticoids and synthetic analogues, initial encounter: Secondary | ICD-10-CM | POA: Diagnosis not present

## 2017-02-16 DIAGNOSIS — R740 Nonspecific elevation of levels of transaminase and lactic acid dehydrogenase [LDH]: Secondary | ICD-10-CM | POA: Diagnosis present

## 2017-02-16 DIAGNOSIS — Z856 Personal history of leukemia: Secondary | ICD-10-CM | POA: Diagnosis not present

## 2017-02-16 DIAGNOSIS — R7401 Elevation of levels of liver transaminase levels: Secondary | ICD-10-CM

## 2017-02-16 DIAGNOSIS — Z960 Presence of urogenital implants: Secondary | ICD-10-CM | POA: Diagnosis not present

## 2017-02-16 DIAGNOSIS — Z9481 Bone marrow transplant status: Secondary | ICD-10-CM | POA: Diagnosis not present

## 2017-02-16 DIAGNOSIS — E099 Drug or chemical induced diabetes mellitus without complications: Secondary | ICD-10-CM | POA: Diagnosis not present

## 2017-02-16 DIAGNOSIS — Z95828 Presence of other vascular implants and grafts: Secondary | ICD-10-CM

## 2017-02-16 DIAGNOSIS — R634 Abnormal weight loss: Secondary | ICD-10-CM | POA: Diagnosis not present

## 2017-02-16 DIAGNOSIS — E872 Acidosis: Secondary | ICD-10-CM | POA: Diagnosis present

## 2017-02-16 DIAGNOSIS — E876 Hypokalemia: Secondary | ICD-10-CM | POA: Diagnosis present

## 2017-02-16 DIAGNOSIS — I959 Hypotension, unspecified: Secondary | ICD-10-CM | POA: Diagnosis not present

## 2017-02-16 DIAGNOSIS — R059 Cough, unspecified: Secondary | ICD-10-CM

## 2017-02-16 DIAGNOSIS — D61810 Antineoplastic chemotherapy induced pancytopenia: Secondary | ICD-10-CM | POA: Diagnosis present

## 2017-02-16 DIAGNOSIS — R6510 Systemic inflammatory response syndrome (SIRS) of non-infectious origin without acute organ dysfunction: Secondary | ICD-10-CM

## 2017-02-16 DIAGNOSIS — Z8701 Personal history of pneumonia (recurrent): Secondary | ICD-10-CM | POA: Diagnosis not present

## 2017-02-16 DIAGNOSIS — R05 Cough: Secondary | ICD-10-CM

## 2017-02-16 LAB — URINALYSIS, ROUTINE W REFLEX MICROSCOPIC
Bilirubin Urine: NEGATIVE
Glucose, UA: NEGATIVE mg/dL
HGB URINE DIPSTICK: NEGATIVE
KETONES UR: NEGATIVE mg/dL
LEUKOCYTES UA: NEGATIVE
Nitrite: NEGATIVE
PROTEIN: NEGATIVE mg/dL
Specific Gravity, Urine: 1.011 (ref 1.005–1.030)
pH: 6 (ref 5.0–8.0)

## 2017-02-16 LAB — COMPREHENSIVE METABOLIC PANEL
ALK PHOS: 287 U/L — AB (ref 40–150)
ALT: 949 U/L — AB (ref 0–55)
ALT: 743 U/L — AB (ref 14–54)
ANION GAP: 8 (ref 5–15)
AST: 4113 U/L (ref 5–34)
AST: 3274 U/L — ABNORMAL HIGH (ref 15–41)
Albumin: 3.2 g/dL — ABNORMAL LOW (ref 3.5–5.0)
Albumin: 2.9 g/dL — ABNORMAL LOW (ref 3.5–5.0)
Alkaline Phosphatase: 240 U/L — ABNORMAL HIGH (ref 38–126)
Anion gap: 14 — ABNORMAL HIGH (ref 3–11)
BILIRUBIN TOTAL: 2.1 mg/dL — AB (ref 0.2–1.2)
BUN: 36 mg/dL — ABNORMAL HIGH (ref 7–26)
BUN: 28 mg/dL — ABNORMAL HIGH (ref 6–20)
CALCIUM: 10.6 mg/dL — AB (ref 8.4–10.4)
CHLORIDE: 101 mmol/L (ref 101–111)
CO2: 20 mmol/L — AB (ref 22–29)
CO2: 23 mmol/L (ref 22–32)
CREATININE: 1.35 mg/dL — AB (ref 0.60–1.10)
Calcium: 9.1 mg/dL (ref 8.9–10.3)
Chloride: 101 mmol/L (ref 98–109)
Creatinine, Ser: 0.79 mg/dL (ref 0.44–1.00)
GFR calc non Af Amer: >60 mL/min (ref >60)
GFR, EST AFRICAN AMERICAN: 53 mL/min — AB (ref >60)
GFR, EST NON AFRICAN AMERICAN: 46 mL/min — AB (ref >60)
Glucose, Bld: 101 mg/dL (ref 70–140)
Glucose, Bld: 96 mg/dL (ref 65–99)
POTASSIUM: 3.8 mmol/L (ref 3.5–5.1)
Potassium: 4 mmol/L (ref 3.3–4.7)
SODIUM: 135 mmol/L — AB (ref 136–145)
SODIUM: 132 mmol/L — AB (ref 135–145)
Total Bilirubin: 1.8 mg/dL — ABNORMAL HIGH (ref 0.3–1.2)
Total Protein: 6.5 g/dL (ref 6.4–8.3)
Total Protein: 5.7 g/dL — ABNORMAL LOW (ref 6.5–8.1)

## 2017-02-16 LAB — CBC WITH DIFFERENTIAL/PLATELET
BASOS PCT: 0 %
Basophils Absolute: 0 10*3/uL (ref 0.0–0.1)
EOS ABS: 0 10*3/uL (ref 0.0–0.5)
Eosinophils Relative: 0 %
HCT: 36.7 % (ref 34.8–46.6)
HEMOGLOBIN: 12.1 g/dL (ref 11.6–15.9)
Lymphocytes Relative: 15 %
Lymphs Abs: 1 10*3/uL (ref 0.9–3.3)
MCH: 26.4 pg (ref 25.1–34.0)
MCHC: 33 g/dL (ref 31.5–36.0)
MCV: 80 fL (ref 79.5–101.0)
MONOS PCT: 9 %
Monocytes Absolute: 0.6 10*3/uL (ref 0.1–0.9)
NEUTROS PCT: 76 %
Neutro Abs: 4.8 10*3/uL (ref 1.5–6.5)
Platelets: 149 10*3/uL (ref 145–400)
RBC: 4.59 MIL/uL (ref 3.70–5.45)
RDW: 15.4 % (ref 11.2–16.1)
WBC: 6.4 10*3/uL (ref 3.9–10.3)

## 2017-02-16 LAB — APTT: APTT: 42 s — AB (ref 24–36)

## 2017-02-16 LAB — LACTIC ACID, PLASMA
LACTIC ACID, VENOUS: 1.7 mmol/L (ref 0.5–1.9)
Lactic Acid, Venous: 2.2 mmol/L (ref 0.5–1.9)

## 2017-02-16 LAB — PROTIME-INR
INR: 1.34
Prothrombin Time: 16.4 seconds — ABNORMAL HIGH (ref 11.4–15.2)

## 2017-02-16 LAB — INFLUENZA PANEL BY PCR (TYPE A & B)
INFLAPCR: NEGATIVE
Influenza B By PCR: NEGATIVE

## 2017-02-16 LAB — LIPASE, BLOOD: LIPASE: 66 U/L — AB (ref 11–51)

## 2017-02-16 LAB — ACETAMINOPHEN LEVEL

## 2017-02-16 LAB — PROCALCITONIN: PROCALCITONIN: 2.01 ng/mL

## 2017-02-16 MED ORDER — ENSURE ENLIVE PO LIQD
237.0000 mL | Freq: Two times a day (BID) | ORAL | Status: DC
  Administered 2017-02-17 – 2017-02-20 (×6): 237 mL via ORAL

## 2017-02-16 MED ORDER — PROMETHAZINE HCL 25 MG/ML IJ SOLN: 12.5000 mg | Freq: Four times a day (QID) | INTRAMUSCULAR | Status: DC | PRN

## 2017-02-16 MED ORDER — ONDANSETRON HCL 4 MG/2ML IJ SOLN
4.0000 mg | Freq: Four times a day (QID) | INTRAMUSCULAR | Status: DC | PRN
  Administered 2017-02-17 (×2): 4 mg via INTRAVENOUS
  Filled 2017-02-16 (×2): qty 2

## 2017-02-16 MED ORDER — SODIUM CHLORIDE 0.9 % IV SOLN
Freq: Once | INTRAVENOUS | Status: AC
  Administered 2017-02-16: 13:00:00 via INTRAVENOUS

## 2017-02-16 MED ORDER — VANCOMYCIN HCL IN DEXTROSE 1-5 GM/200ML-% IV SOLN: 1000.0000 mg | Freq: Once | INTRAVENOUS | Status: DC

## 2017-02-16 MED ORDER — PANTOPRAZOLE SODIUM 40 MG PO TBEC
40.0000 mg | DELAYED_RELEASE_TABLET | Freq: Every day | ORAL | Status: DC
  Administered 2017-02-17 – 2017-02-21 (×5): 40 mg via ORAL
  Filled 2017-02-16 (×5): qty 1

## 2017-02-16 MED ORDER — SODIUM CHLORIDE 0.9 % IV BOLUS (SEPSIS)
1000.0000 mL | Freq: Once | INTRAVENOUS | Status: AC
  Administered 2017-02-16: 1000 mL via INTRAVENOUS

## 2017-02-16 MED ORDER — SODIUM CHLORIDE 0.9 % IV SOLN
INTRAVENOUS | Status: DC
  Administered 2017-02-16 – 2017-02-21 (×8): via INTRAVENOUS

## 2017-02-16 MED ORDER — IBUPROFEN 200 MG PO TABS
400.0000 mg | ORAL_TABLET | Freq: Four times a day (QID) | ORAL | Status: DC | PRN
  Administered 2017-02-16 – 2017-02-20 (×3): 400 mg via ORAL
  Filled 2017-02-16 (×3): qty 2

## 2017-02-16 MED ORDER — SODIUM CHLORIDE 0.9% FLUSH
10.0000 mL | INTRAVENOUS | Status: DC | PRN
  Administered 2017-02-18: 20 mL
  Administered 2017-02-19 – 2017-02-21 (×2): 10 mL
  Filled 2017-02-16 (×3): qty 40

## 2017-02-16 MED ORDER — ONDANSETRON HCL 4 MG PO TABS: 8.0000 mg | ORAL_TABLET | Freq: Three times a day (TID) | ORAL | Status: DC | PRN

## 2017-02-16 MED ORDER — VANCOMYCIN HCL 10 G IV SOLR
1250.0000 mg | INTRAVENOUS | Status: DC
  Administered 2017-02-17 – 2017-02-18 (×2): 1250 mg via INTRAVENOUS
  Filled 2017-02-16 (×2): qty 1250

## 2017-02-16 MED ORDER — ONDANSETRON HCL 4 MG PO TABS: 4.0000 mg | ORAL_TABLET | Freq: Four times a day (QID) | ORAL | Status: DC | PRN

## 2017-02-16 MED ORDER — PIPERACILLIN-TAZOBACTAM 3.375 G IVPB
3.3750 g | Freq: Three times a day (TID) | INTRAVENOUS | Status: DC
  Administered 2017-02-16 – 2017-02-19 (×9): 3.375 g via INTRAVENOUS
  Filled 2017-02-16 (×7): qty 50

## 2017-02-16 MED ORDER — TENOFOVIR DISOPROXIL FUMARATE 300 MG PO TABS
300.0000 mg | ORAL_TABLET | Freq: Every day | ORAL | Status: DC
  Administered 2017-02-16 – 2017-02-21 (×6): 300 mg via ORAL
  Filled 2017-02-16 (×6): qty 1

## 2017-02-16 MED ORDER — SODIUM CHLORIDE 0.9 % IJ SOLN
10.0000 mL | INTRAMUSCULAR | Status: DC | PRN
Start: 2017-02-16 — End: 2017-02-16
  Administered 2017-02-16: 10 mL via INTRAVENOUS
  Filled 2017-02-16: qty 10

## 2017-02-16 MED ORDER — ACYCLOVIR 400 MG PO TABS
800.0000 mg | ORAL_TABLET | Freq: Two times a day (BID) | ORAL | Status: DC
  Administered 2017-02-16 – 2017-02-21 (×10): 800 mg via ORAL
  Filled 2017-02-16 (×10): qty 2

## 2017-02-16 MED ORDER — VANCOMYCIN HCL 10 G IV SOLR
1250.0000 mg | Freq: Once | INTRAVENOUS | Status: AC
  Administered 2017-02-16: 1250 mg via INTRAVENOUS
  Filled 2017-02-16: qty 1250

## 2017-02-16 MED ORDER — PIPERACILLIN-TAZOBACTAM 3.375 G IVPB 30 MIN
3.3750 g | Freq: Once | INTRAVENOUS | Status: AC
  Administered 2017-02-16: 3.375 g via INTRAVENOUS
  Filled 2017-02-16: qty 50

## 2017-02-16 MED ORDER — NYSTATIN 100000 UNIT/ML MT SUSP
5.0000 mL | Freq: Four times a day (QID) | OROMUCOSAL | Status: DC
  Administered 2017-02-16 – 2017-02-20 (×9): 500000 [IU] via ORAL
  Filled 2017-02-16 (×12): qty 5

## 2017-02-16 MED ORDER — ENOXAPARIN SODIUM 40 MG/0.4ML ~~LOC~~ SOLN
40.0000 mg | SUBCUTANEOUS | Status: DC
  Administered 2017-02-16: 40 mg via SUBCUTANEOUS
  Filled 2017-02-16 (×2): qty 0.4

## 2017-02-16 MED ORDER — POLYETHYLENE GLYCOL 3350 17 G PO PACK: 17.0000 g | PACK | Freq: Every day | ORAL | Status: DC | PRN

## 2017-02-16 NOTE — Progress Notes (Signed)
Pharmacy Antibiotic Note  Sheryl [name] is a 47 y.o. female admitted on 02/16/2017 with sepsis.  PMH significant for plasma cell leukemia, s/p BMT, hepatitis B.  Recently developed possible pneumonia and was treated with levofloxacin.  At [facility] today patient was found to be tachycardic, dehydrated and abnormal liver function enzymes.  Sent to [facility] for admission.  Pharmacy has been consulted for Vancomycin and Zosyn dosing.  ID consulted.  Plan:  Zosyn 3.375gm IV q8h (each dose infused over 4 hrs)  Vancomycin 1250mg  IV q24h for goal AUC 400-500 (calculated AUC = 495.3 using SCr 0.79)  Check daily SCr while on Vancomycin and Zosyn  Recommend checking MRSA PCR  Height: 5\' 3"  (160 cm) Weight: 133 lb 6.1 oz (60.5 kg) IBW/kg (Calculated) : 52.4  Temp (24hrs), Avg:99.8 F (37.7 C), Min:98.3 F (36.8 C), Max:102.3 F (39.1 C)  Recent Labs  Lab 02/16/17 1134 02/16/17 1609 02/16/17 1744  WBC 6.4  --   --   CREATININE 1.35* 0.79  --   LATICACIDVEN  --   --  2.2*    Estimated Creatinine Clearance: 71.1 mL/min (by C-G formula based on SCr of 0.79 mg/dL).    No Known Allergies  Antimicrobials this admission: 1/14 Vanc >>   1/14 Zosyn >>    Dose adjustments this admission:    Microbiology results: 1/14 BCx: sent 1/14 UCx: sent  MRSA PCR:  Thank you for allowing pharmacy to be a part of this patient's care.  Everette Rank, PharmD 02/16/2017 6:56 PM

## 2017-02-16 NOTE — Progress Notes (Signed)
Offered to use translating services to patient.  PT refused, stated she would prefer for her daughter to do the translating.

## 2017-02-16 NOTE — Progress Notes (Signed)
Patient approved for daughter, mother and father to be in room during admission process questioning

## 2017-02-16 NOTE — Progress Notes (Signed)
CRITICAL VALUE ALERT  Critical Value:  Lactic acid 2.2  Date & Time Notied:  02/16/2017 1850  Provider Notified: Dr. Sheran Fava  Orders Received/Actions taken: see orders.

## 2017-02-16 NOTE — Progress Notes (Addendum)
Late entry:  Pt BP at 2056 95/50, NP K. Schorr paged. 1L fluid bolus ordered. Will continue to monitor.  2350: Manual BP 82/50. NP K. Schorr notified. Other vitals are stable at this time. Will continue to monitor. -- 1L fluid bolus ordered.  BP after second 1L bolus was 104/71. Pt resting comfortably. Will continue to monitor.

## 2017-02-16 NOTE — Progress Notes (Signed)
Pt came in for treatment today and pt accompanied by daughter. Pt is not feeling well. She states that she had been done with flu/antibiotics x 1 week and still feel very weak. Pt reports fevers,chills at home, n/v (nausea meds helping), anorexia x 2 weeks. Dehydration (pt not drinking fluids), pt urine concentrated yellow, moderate amount of yellow thick sputum coming out when coughing.   Pt denies sob, chest pain, diarrhea. Pt states that the only thing that she eats is ensure. Very little fluid intake orally. Pt also has burning from (thrush) and makes it hard to eat/drink anything. Pt did not see MD today.  Notified and updated Van,PA for symptom management today. Will be evaluated shortly. Started pt on IVF's. VSS abnormal and reported to [facility].   Labs still pending. Dr.Feng's collab nurse notified. Waiting for updates to see if pt will get treated today.

## 2017-02-16 NOTE — Progress Notes (Signed)
Pt was seen and evaluated by Van,PA (SM) and Dr.Feng. Pt CMP was abnormal. No chemo treatment for today. Pt will be admitted to 4E for further evaluation of abnormal LFT's and dehydration. Report given to hospital ist and unit RN by Lucianne Lei T,PA. Will transport pt with port a cath accessed and IVF's. Pt accompanied by family at this time.
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
## 2017-02-16 NOTE — Progress Notes (Addendum)
Sympt[facility] Management Clinic Progress Note   Sheryl [name] [id number] 12-Mar-1970 47 y.o.  Sheryl [name] is managed by Dr. Truitt Merle  Actively treated with chemotherapy: yes  Current Therapy: Velcade and Darzalex   Last Treated: 02/02/2017  Assessment: Plan:    Abnormal LFTs  Dehydration  Chronic viral hepatitis B without delta agent and without coma ([place])  Plasma cell leukemia in relapse ([place])  Pneumonia due to infectious organism, unspecified laterality, unspecified part of lung   Abnormal LFT: The patient is being admitted to [facility] under the care of Dr. Edwin Cap.  I discussed with Dr. Sheran Fava that the patient will require imaging studies of her liver.   Dehydration: The patient was given 1 L of normal saline IV today.  Chronic viral hepatitis B without delta agent and without coma: The patient has been followed by Dr. Delorise Jackson at [facility].  She was last seen there on 01/19/2017 with her LFTs within normal range.  A copy of this clinic note can be found in Care Everywhere.    Plasma cell leukemia in relapse: The patient is status post cycle 44, day 1 of Velcade and Darzalex last treated on 02/02/2017.  Pneumonia: The patient was diagnosed with pneumonia on 02/09/2017 and was treated with Levaquin times 7 days.  Please see After Visit Summary for patient specific instructions.  Future Appointments  Date Time Provider [place]  02/23/2017 10:30 AM [facility] C9 [facility] None  03/02/2017 10:45 AM [facility] LAB 3 [facility] None  03/02/2017 11:00 AM [facility] E16 [facility] None  03/02/2017 11:30 AM Alla Feeling, NP [facility] None  03/02/2017  2:00 PM [facility] I25 DNS [facility] None    No orders of the defined types were placed in this encounter.      Subjective:   Patient ID:  Sheryl [name] is a 47 y.o. (DOB 05-05-1970) female.  Chief Complaint: No chief complaint on file.   HPI Kirsta Whittenburg presents to  chemotherapy infusion today for consideration of cycle 45, day 1 of Velcade and Darzalex which was held last week due to the patient being diagnosed with pneumonia.  She reports that she has had decreased p.o. intake for the past 2 weeks and has had little to eat or drink due to nausea last week wall on Levaquin for 7 days.  She reports feeling fatigue, nausea, and chills.  She has had subjective fevers but has been normotensive each time she took her temperature.  She denies vomiting, constipation, or diarrhea.  She has a history of chronic viral hepatitis B without delta agent and without coma.  She is followed by Dr. Delorise Jackson as at [facility].  She was last seen there on 01/19/2017.  According to Dr. Louretta Parma note her chronic hepatitis B was diagnosed in 2003.  She was started on entecavir on 10/2014 and has been compliant until June 2018 when there was a change in her insurance and she lost coverage for this medication.  Her labs at her last visit with Dr. Lolita Lenz showed a total bilirubin of 0.2, alkaline phosphatase of 83, AST of 22, and ALT of 8.  Labs from her visit last week in our office showed a total bilirubin of 0.4, alkaline phosphatase of 193, AST of 385, and ALT of 164.  Her LFTs from today returned significantly elevated with a total bilirubin of 2.1, alkaline phosphatase of 287, AST of 4113, and ALT of 949.  Dr. Janece Canterbury graciously  agreed to accept this patient for admission to [facility].  Medications: I have reviewed the patient's current medications.  Allergies: No Known Allergies  Past Medical History:  Diagnosis Date  . Chills with fever    intermittently since d/c from hospital  . Dysuria-frequency syndrome    w/ pink urine  . GERD (gastroesophageal reflux disease)   . Hepatitis   . History of positive PPD    DX 2011--  CXR DONE NO EVIDENCE  . History of ureter stent   . Hydronephrosis, right   . Neuromuscular disorder (HCC)    legs  numb intermittently  . 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[place])   . Pneumonia   . Right ureteral stone   . Urosepsis 8/14   admitted to [facility]    Past Surgical History:  Procedure Laterality Date  . CYSTOSCOPY W/ URETERAL STENT PLACEMENT Right 09/25/2012   Procedure: CYSTOSCOPY WITH RETROGRADE PYELOGRAM/URETERAL STENT PLACEMENT;  Surgeon: Alexis Frock, MD;  Location: [facility];  Service: Urology;  Laterality: Right;  . CYSTOSCOPY WITH RETROGRADE PYELOGRAM, URETEROSCOPY AND STENT PLACEMENT Right 10/15/2012   Procedure: CYSTOSCOPY WITH RETROGRADE PYELOGRAM, URETEROSCOPY AND REMOVAL STENT WITH  STENT PLACEMENT;  Surgeon: Alexis Frock, MD;  Location[facility];  Service: Urology;  Laterality: Right;  . ESOPHAGOGASTRODUODENOSCOPY (EGD) WITH PROPOFOL N/A 11/16/2014   Procedure: ESOPHAGOGASTRODUODENOSCOPY (EGD) WITH PROPOFOL;  Surgeon: Milus Banister, MD;  [facility];  Service: Endoscopy;  Laterality: N/A;  . HOLMIUM LASER APPLICATION Right 2/42/6834   Procedure: HOLMIUM LASER APPLICATION;  Surgeon: Alexis Frock, MD;  Location: [facility];  Service: Urology;  Laterality: Right;  . LIVER BIOPSY    . OTHER SURGICAL HISTORY Right    removal of ovarian cyst  . removal of uterine cyst     years ago  . RIGHT VATS W/ DRAINAGE PEURAL EFFUSION AND BX'S  10-30-2008    Family History  Problem Relation Age of Onset  . Stomach cancer Mother   . Lung disease Father   . Asthma Father     Social History   Socioeconomic History  . Marital status: Single    Spouse name: Not on file  . Number of children: 3  . Years of education: Not on file  . Highest education level: Not on file  Social Needs  . Financial resource strain: Not on file  . Food insecurity - worry: Not on file  . Food insecurity - inability: Not on file  . Transportation needs - medical: Not on file  . Transportation needs - non-medical: Not on file  Occupational History  . Not on file  Tobacco Use   . Smoking status: Never Smoker  . Smokeless tobacco: Never Used  Substance and Sexual Activity  . Alcohol use: No    Alcohol/week: 0.0 oz  . Drug use: No  . Sexual activity: Not Currently    Birth control/protection: Abstinence  Other Topics Concern  . Not on file  Social History Narrative  . Not on file    Past Medical History, Surgical history, Social history, and Family history were reviewed and updated as appropriate.   Please see review of systems for further details on the patient's review from today.   Review of Systems:  Review of Systems  Constitutional: Positive for appetite change, chills and fatigue. Negative for diaphoresis and fever.  HENT: Negative for trouble swallowing.   Respiratory: Negative for cough, chest tightness and shortness of breath.   Cardiovascular: Positive for palpitations. Negative  for chest pain and leg swelling.  Gastrointestinal: Negative for abdominal distention, abdominal pain, constipation, diarrhea, nausea and vomiting.  Genitourinary: Negative for difficulty urinating and dysuria.  Skin: Negative for color change.    Objective:   Physical Exam:  There were no vitals taken for this visit. ECOG: 1  Physical Exam  Constitutional: No distress.  HENT:  Head: Normocephalic and atraumatic.  Cardiovascular: S1 normal and S2 normal. Tachycardia present.  Pulmonary/Chest: Effort normal and breath sounds normal. No respiratory distress. She has no wheezes. She has no rales.  Abdominal: Soft. Bowel sounds are normal. She exhibits no distension. There is no tenderness. There is no rebound and no guarding.  Skin: Skin is warm and dry. She is not diaphoretic.  Right chest wall Port-A-Cath noted with out erythema or exudate.    Lab Review:     Component Value Date/Time   NA 132 (L) 02/16/2017 1609   NA 140 02/02/2017 1317   K 3.8 02/16/2017 1609   K 4.0 02/02/2017 1317   CL 101 02/16/2017 1609   CO2 23 02/16/2017 1609   CO2 25  02/02/2017 1317   GLUCOSE 96 02/16/2017 1609   GLUCOSE 121 02/02/2017 1317   BUN 28 (H) 02/16/2017 1609   BUN 13.8 02/02/2017 1317   CREATININE 0.79 02/16/2017 1609   CREATININE 0.8 02/02/2017 1317   CALCIUM 9.1 02/16/2017 1609   CALCIUM 9.3 02/02/2017 1317   PROT 5.7 (L) 02/16/2017 1609   PROT 6.9 02/02/2017 1317   ALBUMIN 2.9 (L) 02/16/2017 1609   ALBUMIN 3.5 02/02/2017 1317   AST PENDING 02/16/2017 1609   AST 47 (H) 02/02/2017 1317   ALT 743 (H) 02/16/2017 1609   ALT 23 02/02/2017 1317   ALKPHOS 240 (H) 02/16/2017 1609   ALKPHOS 98 02/02/2017 1317   BILITOT 1.8 (H) 02/16/2017 1609   BILITOT <0.22 02/02/2017 1317   GFRNONAA >60 02/16/2017 1609   GFRAA >60 02/16/2017 1609       Component Value Date/Time   WBC 6.4 02/16/2017 1134   RBC 4.59 02/16/2017 1134   HGB 12.1 02/16/2017 1134   HGB 10.4 (L) 02/02/2017 1317   HCT 36.7 02/16/2017 1134   HCT 33.9 (L) 02/02/2017 1317   PLT 149 02/16/2017 1134   PLT 172 02/02/2017 1317   MCV 80.0 02/16/2017 1134   MCV 87.6 02/02/2017 1317   MCH 26.4 02/16/2017 1134   MCHC 33.0 02/16/2017 1134   RDW 15.4 02/16/2017 1134   RDW 14.1 02/02/2017 1317   LYMPHSABS 1.0 02/16/2017 1134   LYMPHSABS 0.7 (L) 02/02/2017 1317   MONOABS 0.6 02/16/2017 1134   MONOABS 0.1 02/02/2017 1317   EOSABS 0.0 02/16/2017 1134   EOSABS 0.0 02/02/2017 1317   BASOSABS 0.0 02/16/2017 1134   BASOSABS 0.0 02/02/2017 1317   -------------------------------  Imaging from last 24 hours (if applicable):  Radiology interpretation: Dg Chest 2 View  Result Date: 02/09/2017 CLINICAL DATA:  Productive cough and URI.  Evaluate for pneumonia. EXAM: CHEST  2 VIEW COMPARISON:  Chest x-ray dated May 11, 2016. FINDINGS: Unchanged right chest wall port catheter with the tip in the proximal right atrium. Stable borderline cardiomegaly. Normal pulmonary vascularity. Mild patchy opacity in the right lower lobe. No pleural effusion or pneumothorax. No acute osseous abnormality.  IMPRESSION: Mild patchy opacity in the right lower lobe, which could reflect atelectasis or pneumonia. Electronically Signed   By: Titus Dubin M.D.   On: 02/09/2017 15:38        This patient  was seen with Dr. Burr Medico with my treatment plan reviewed with her. She expressed agreement with my medical management of this patient.  Addendum  I have seen the patient, examined her. I agree with the assessment and and plan and have edited the notes.   Ms Flood was seen last week for pneumonia, has been taking Levaquin for a week, still not feeling well.  She appears to be dehydrated, will receive IV fluids.  Lab reviewed significant elevated liver enzymes, we will hold chemo treatment today, and admitted patient to [facility] for further workup. I appreciate the hospitalist assistance on her admission.   Truitt Merle  02/17/2017

## 2017-02-16 NOTE — H&P (Signed)
History and Physical    Dorethia Jeanmarie NID:[id number] DOB: January 19, 1971 DOA: 02/16/2017  Referring MD/NP/PA: Truitt Merle PCP: [name], MD   Patient coming from: home  Chief Complaint: nausea, dehydration   HPI: Sheryl [name] is a 47 y.o. 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  She had a relapse in January 2018.  She received chemotherapy at the [facility] and chronic hepatitis B for which she was previously taking entecavir but which stopped over the summer of 2018 and she only restarted this medication about two weeks ago.  Her last Hep B viral load was in December and was 82,700,000 (HIGH).  She has also had a positive PPD but CXR done in 2011 demonstrated no evidence of active disease.    She was feeling well about a month ago, then developed anorexia and fatigue about 3 weeks ago.  She has had associated RUQ pressure that is mild and constant but becomes more severe when she has fevers.  About two weeks ago she developed some cough and fevers up to 100F at home.  She was diagnosed with possible RLL pneumonia and thrush and given a prescription for levofloxacin and fluconazole.  She was not given her chemotherapy due to her acute infection.  Her cough improved, but she continued to have anorexia, fatigue, and nausea without vomiting.  Her last BM was two weeks ago and she had some blood at the end of her stool c/w her hemorrhoids.  She has not had a BM or rectal bleeding since that time.  IN the [place] today, vital signs were stable except for tachycardia and she appeared dehydrated.  Labs demonstrated some acute kidney injury and transaminitis with AST 4113, ALT 949, total bili 2.1 and alk phos 287.  INR 1.34.  CBC was normal with normal differential.  She was directly admitted for further work up and treatment of her AKI and transaminitis.    Upon arrive on the unit, her temperature was 102F and she remained  tachycardic, but with stable blood pressure.     Review of Systems:  General:  Positive fevers, chills, weight loss  HEENT:  Denies changes to hearing and vision, sinus congestion, sore throat CV:   Denies chest pain, palpitations, lower extremity edema.  PULM:  Positive SOB, cough.   GI:  Positive nausea, vomiting, and no stools in 2 weeks GU:  Denies dysuria, frequency, urgency ENDO:  Positive polyuria, denies polydipsia.   HEME:  Had red blood at the end of her BM two weeks ago.  denies abnormal bruising or bleeding.  LYMPH:  Denies lymphadenopathy.   MSK:  Denies arthralgias, myalgias.   DERM:  Denies skin rash or ulcer.   NEURO:  Denies new focal numbness or weakness, slurred speech, confusion.  Diffusely weak PSYCH:  Denies anxiety and depression.    Past Medical History:  Diagnosis Date  . Chills with fever    intermittently since d/c from hospital  . Dysuria-frequency syndrome    w/ pink urine  . GERD (gastroesophageal reflux disease)   . Hepatitis   . History of positive PPD    DX 2011--  CXR DONE NO EVIDENCE  . History of ureter stent   . Hydronephrosis, right   . Neuromuscular disorder (HCC)    legs numb intermittently  . 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[place])   . Pneumonia   . Right ureteral stone   . Urosepsis 8/14  ad[facility]    Past Surgical History:  Procedure Laterality Date  . CYSTOSCOPY W/ URETERAL STENT PLACEMENT Right 09/25/2012   Procedure: CYSTOSCOPY WITH RETROGRADE PYELOGRAM/URETERAL STENT PLACEMENT;  Surgeon: Alexis Frock, MD;  [facility];  Service: Urology;  Laterality: Right;  . CYSTOSCOPY WITH RETROGRADE PYELOGRAM, URETEROSCOPY AND STENT PLACEMENT Right 10/15/2012   Procedure: CYSTOSCOPY WITH RETROGRADE PYELOGRAM, URETEROSCOPY AND REMOVAL STENT WITH  STENT PLACEMENT;  Surgeon: Alexis Frock, MD;  Location[facility];  Service: Urology;  Laterality: Right;  . ESOPHAGOGASTRODUODENOSCOPY (EGD) WITH PROPOFOL N/A 11/16/2014    Procedure: ESOPHAGOGASTRODUODENOSCOPY (EGD) WITH PROPOFOL;  Surgeon: Milus Banister, MD;  [facility];  Service: Endoscopy;  Laterality: N/A;  . HOLMIUM LASER APPLICATION Right 4/00/8676   Procedure: HOLMIUM LASER APPLICATION;  Surgeon: Alexis Frock, MD;  Location: [facility];  Service: Urology;  Laterality: Right;  . LIVER BIOPSY    . OTHER SURGICAL HISTORY Right    removal of ovarian cyst  . removal of uterine cyst     years ago  . RIGHT VATS W/ DRAINAGE PEURAL EFFUSION AND BX'S  10-30-2008     reports that  has never smoked. she has never used smokeless tobacco. She reports that she does not drink alcohol or use drugs.  No Known Allergies  Family History  Problem Relation Age of Onset  . Stomach cancer Mother   . Lung disease Father   . Asthma Father     Prior to Admission medications   Medication Sig Start Date End Date Taking? Authorizing Provider  acetaminophen (TYLENOL) 325 MG tablet Take 2 tablets (650 mg total) by mouth every 6 (six) hours as needed for mild pain (or Fever >/= 101). 05/16/16  Yes Robbie Lis, MD  acyclovir (ZOVIRAX) 800 MG tablet Take 1 tablet (800 mg total) by mouth 2 (two) times daily. 02/02/17  Yes Truitt Merle, MD  chlorpheniramine-HYDROcodone (TUSSIONEX) 10-8 MG/5ML SUER Take 5 mLs by mouth every 12 (twelve) hours as needed for cough. 02/09/17  Yes Tanner, Lyndon Code., PA-C  dexamethasone (DECADRON) 4 MG tablet Take 10 tablets (40 mg total) by mouth once a week. 01/05/17   Truitt Merle, MD  fluconazole (DIFLUCAN) 100 MG tablet Take 1 tablet (100 mg total) by mouth daily. 02/09/17   Tanner, Lyndon Code., PA-C  levofloxacin (LEVAQUIN) 750 MG tablet Take 1 tablet (750 mg total) by mouth daily. 02/09/17   Truitt Merle, MD  metFORMIN (GLUCOPHAGE) 500 MG tablet Take 1 tablet (500 mg total) by mouth 2 (two) times daily with a meal. 02/02/17   Truitt Merle, MD  omeprazole (PRILOSEC) 20 MG capsule Take 1 capsule (20 mg total) by mouth daily. 01/05/17   Truitt Merle,  MD  ondansetron (ZOFRAN) 8 MG tablet Take 1 tablet (8 mg total) by mouth every 8 (eight) hours as needed for nausea or vomiting. 02/18/16   Truitt Merle, MD  polyethylene glycol [name] / Floria Raveling) packet Take 17 g by mouth daily as needed.    [provider]  prochlorperazine (COMPAZINE) 10 MG tablet Take 1 tablet (10 mg total) by mouth every 6 (six) hours as needed for nausea or vomiting. 01/21/17   Truitt Merle, MD  promethazine (PHENERGAN) 25 MG tablet Take 1 tablet (25 mg total) by mouth every 6 (six) hours as needed for nausea or vomiting. 05/16/16   Robbie Lis, MD    Physical Exam: Vitals:   02/16/17 1520 02/16/17 1651  BP: 121/71   Pulse: Marland Kitchen)  103   Resp: 20   Temp: 99.3 F (37.4 C) (!) 102.3 F (39.1 C)  TempSrc: Oral   SpO2: 98%   Weight: 60.5 kg (133 lb 6.1 oz)   Height: _0  (1.6 m)     Constitutional:  Adult female NAD, calm, comfortable Eyes: PERRL, lids and conjunctivae normal ENMT:  Moist mucous membranes.  White plaques on tongue and a small spot on soft palate    Neck:  No nuchal rigidity, no masses Respiratory:  No wheezes, or rhonchi.  Rales at the right base  Cardiovascular: tachycardic, regular rhythm, no murmurs / rubs / gallops.  2+ radial pulses. Abdomen:  Normal active bowel sounds, soft, nondistended, mildly TTP in the RUQ without rebound or guarding Musculoskeletal: Normal muscle tone and bulk.  No contractures.  Skin:  no rashes, abrasions, or ulcers Neurologic:  CN 2-12 grossly intact. Sensation intact to light touch, strength 5/5 throughout Psychiatric:  Alert and oriented x 3. Normal affect.   Labs on Admission: I have personally reviewed following labs and imaging studies  CBC: Recent Labs  Lab 02/16/17 1134  WBC 6.4  NEUTROABS 4.8  HGB 12.1  HCT 36.7  MCV 80.0  PLT 875   Basic Metabolic Panel: Recent Labs  Lab 02/16/17 1134 02/16/17 1609  NA 135* 132*  K 4.0 3.8  CL 101 101  CO2 20* 23  GLUCOSE 101 96  BUN 36* 28*    CREATININE 1.35* 0.79  CALCIUM 10.6* 9.1   GFR: Estimated Creatinine Clearance: 71.1 mL/min (by C-G formula based on SCr of 0.79 mg/dL). Liver Function Tests: Recent Labs  Lab 02/16/17 1134 02/16/17 1609  AST 4,113* PENDING  ALT 949* 743*  ALKPHOS 287* 240*  BILITOT 2.1* 1.8*  PROT 6.5 5.7*  ALBUMIN 3.2* 2.9*   Recent Labs  Lab 02/16/17 1609  LIPASE 66*   No results for input(s): AMMONIA in the last 168 hours. Coagulation Profile: Recent Labs  Lab 02/16/17 1609  INR 1.34   Cardiac Enzymes: No results for input(s): CKTOTAL, CKMB, CKMBINDEX, TROPONINI in the last 168 hours. BNP (last 3 results) No results for input(s): PROBNP in the last 8760 hours. HbA1C: No results for input(s): HGBA1C in the last 72 hours. CBG: No results for input(s): GLUCAP in the last 168 hours. Lipid Profile: No results for input(s): CHOL, HDL, LDLCALC, TRIG, CHOLHDL, LDLDIRECT in the last 72 hours. Thyroid Function Tests: No results for input(s): TSH, T4TOTAL, FREET4, T3FREE, THYROIDAB in the last 72 hours. Anemia Panel: No results for input(s): VITAMINB12, FOLATE, FERRITIN, TIBC, IRON, RETICCTPCT in the last 72 hours. Urine analysis:    Component Value Date/Time   COLORURINE YELLOW 05/11/2016 1111   APPEARANCEUR CLOUDY (A) 05/11/2016 1111   LABSPEC 1.014 05/11/2016 1111   LABSPEC 1.010 04/09/2016 1356   PHURINE 7.0 05/11/2016 1111   GLUCOSEU NEGATIVE 05/11/2016 1111   GLUCOSEU Negative 04/09/2016 1356   HGBUR NEGATIVE 05/11/2016 1111   BILIRUBINUR NEGATIVE 05/11/2016 1111   BILIRUBINUR Negative 04/09/2016 1356   KETONESUR NEGATIVE 05/11/2016 1111   PROTEINUR NEGATIVE 05/11/2016 1111   UROBILINOGEN 0.2 04/09/2016 1356   NITRITE NEGATIVE 05/11/2016 1111   LEUKOCYTESUR NEGATIVE 05/11/2016 1111   LEUKOCYTESUR Moderate 04/09/2016 1356   Sepsis Labs: _1 (procalcitonin:4,lacticidven:4) )No results found for this or any previous visit (from the past 240 hour(s)).    Radiological Exams on Admission: No results found.  EKG: Independently reviewed.  Sinus tachycardia, no ischemic changes, QTc wnl  Assessment/Plan Principal Problem:   Abnormal LFTs Active  Problems:   Hepatitis B   Dehydration   Fever   Transaminitis   Chills with fever   SIRS (systemic inflammatory response syndrome) (HCC)   Thrush   SIRS, possible sepsis (sinus tachycardia, high fever.  BP so far stable and lactic acid is pending).  Patient at risk for bacteria infection due to her leukemia and chemotherapy with port present.  This may also be due to her acute hepatitis.   -  Blood cultures, including from her port -  UA and culture -  CXR  -  Flu PCR -  Check lactic acid -  Vancomycin and zosyn pending cultures -  Ibuprofen prn fever  Transaminitis without liver failure, likely due to acute exacerbation of hepatitis B plus recent fluconazole administration.  Patient reports taking entecavir x 2 weeks.   -  Hepatitis B Quant PCR -  Acute hepatitis panel  -  Tylenol level:  negative -  ID consult -  Tenofovir ordered per ID -  Trend LFTs and INR -  RUQ [place] with doppler  Dehydration with elevated creatinine at 1.35 prior to admission, but received IVF at cancer center and on repeat at time of admission, creatinine back to 0.79.   -  Continue IVF  Anorexia due to hepatitis and cancer -  Nutrition consult -  Ensure -  Advance to regular diet when able -  NPO at [facility] for ultrasound  Thrush -  Nystatin  Plasma cell leukemia, chemotherapy on hold  -  Dr. Burr Medico added to rounding list  DVT prophylaxis: lovenox  Code Status: full Family Communication:  Patient and her daughter  Disposition Plan:  Likely to home in a few days  Consults called: Infectious Disease, Dr. Baxter Flattery  Admission status: inpatient due to poor appetite and pending work up for transaminitis which is expected to take several days.     Janece Canterbury MD [facility] Pager [phone]  If  7PM-7AM, please contact night-coverage [email or web address] Password [email or web address]  02/16/2017, 5:25 PM

## 2017-02-17 ENCOUNTER — Encounter: Payer: Self-pay | Admitting: Medical

## 2017-02-17 ENCOUNTER — Other Ambulatory Visit: Payer: Self-pay | Admitting: Internal Medicine

## 2017-02-17 ENCOUNTER — Inpatient Hospital Stay (HOSPITAL_COMMUNITY): Payer: Medicaid Other

## 2017-02-17 DIAGNOSIS — D61810 Antineoplastic chemotherapy induced pancytopenia: Secondary | ICD-10-CM

## 2017-02-17 DIAGNOSIS — A419 Sepsis, unspecified organism: Principal | ICD-10-CM

## 2017-02-17 DIAGNOSIS — C9012 Plasma cell leukemia in relapse: Secondary | ICD-10-CM

## 2017-02-17 DIAGNOSIS — Z8701 Personal history of pneumonia (recurrent): Secondary | ICD-10-CM

## 2017-02-17 DIAGNOSIS — R740 Nonspecific elevation of levels of transaminase and lactic acid dehydrogenase [LDH]: Secondary | ICD-10-CM

## 2017-02-17 DIAGNOSIS — N179 Acute kidney failure, unspecified: Secondary | ICD-10-CM

## 2017-02-17 DIAGNOSIS — I959 Hypotension, unspecified: Secondary | ICD-10-CM

## 2017-02-17 LAB — CBC
HCT: 27.3 % — ABNORMAL LOW (ref 36.0–46.0)
HEMOGLOBIN: 9.2 g/dL — AB (ref 12.0–15.0)
MCH: 26.1 pg (ref 26.0–34.0)
MCHC: 33.7 g/dL (ref 30.0–36.0)
MCV: 77.6 fL — ABNORMAL LOW (ref 78.0–100.0)
PLATELETS: 77 10*3/uL — AB (ref 150–400)
RBC: 3.52 MIL/uL — AB (ref 3.87–5.11)
RDW: 15.5 % (ref 11.5–15.5)
WBC: 3.3 10*3/uL — ABNORMAL LOW (ref 4.0–10.5)

## 2017-02-17 LAB — HEPATITIS PANEL, ACUTE
HEP A IGM: NEGATIVE
Hep B C IgM: POSITIVE — AB
Hepatitis B Surface Ag: POSITIVE — AB

## 2017-02-17 LAB — PROTIME-INR
INR: 1.52
PROTHROMBIN TIME: 18.2 s — AB (ref 11.4–15.2)

## 2017-02-17 LAB — COMPREHENSIVE METABOLIC PANEL
ALT: 596 U/L — ABNORMAL HIGH (ref 14–54)
ANION GAP: 7 (ref 5–15)
AST: 1955 U/L — ABNORMAL HIGH (ref 15–41)
Albumin: 2.4 g/dL — ABNORMAL LOW (ref 3.5–5.0)
Alkaline Phosphatase: 192 U/L — ABNORMAL HIGH (ref 38–126)
BILIRUBIN TOTAL: 1.8 mg/dL — AB (ref 0.3–1.2)
BUN: 15 mg/dL (ref 6–20)
CO2: 23 mmol/L (ref 22–32)
Calcium: 7.8 mg/dL — ABNORMAL LOW (ref 8.9–10.3)
Chloride: 108 mmol/L (ref 101–111)
Creatinine, Ser: 0.53 mg/dL (ref 0.44–1.00)
GFR calc non Af Amer: >60 mL/min (ref >60)
Glucose, Bld: 83 mg/dL (ref 65–99)
Potassium: 3.2 mmol/L — ABNORMAL LOW (ref 3.5–5.1)
Sodium: 138 mmol/L (ref 135–145)
TOTAL PROTEIN: 4.6 g/dL — AB (ref 6.5–8.1)

## 2017-02-17 LAB — IFE, DARA-SPECIFIC, SERUM
IGA: 47 mg/dL — AB (ref 87–352)
IGG (IMMUNOGLOBIN G), SERUM: 866 mg/dL (ref 700–1600)
IgM (Immunoglobulin M), Srm: 48 mg/dL (ref 26–217)

## 2017-02-17 LAB — HEPATITIS B DNA, ULTRAQUANTITATIVE, PCR
HBV DNA SERPL PCR-ACNC: 1430000 IU/mL
HBV DNA SERPL PCR-LOG IU: 6.155 log10 IU/mL

## 2017-02-17 LAB — MRSA PCR SCREENING: MRSA by PCR: NEGATIVE

## 2017-02-17 MED ORDER — POLYETHYLENE GLYCOL 3350 17 G PO PACK
17.0000 g | PACK | Freq: Two times a day (BID) | ORAL | Status: DC
  Administered 2017-02-17 – 2017-02-19 (×4): 17 g via ORAL
  Filled 2017-02-17 (×7): qty 1

## 2017-02-17 MED ORDER — BISACODYL 10 MG RE SUPP
10.0000 mg | Freq: Once | RECTAL | Status: DC
  Filled 2017-02-17: qty 1

## 2017-02-17 MED ORDER — POTASSIUM CHLORIDE CRYS ER 20 MEQ PO TBCR
40.0000 meq | EXTENDED_RELEASE_TABLET | Freq: Once | ORAL | Status: AC
  Administered 2017-02-17: 40 meq via ORAL
  Filled 2017-02-17: qty 2

## 2017-02-17 MED ORDER — SENNA 8.6 MG PO TABS
2.0000 | ORAL_TABLET | Freq: Every day | ORAL | Status: DC
  Administered 2017-02-17 – 2017-02-20 (×4): 17.2 mg via ORAL
  Filled 2017-02-17 (×4): qty 2

## 2017-02-17 MED ORDER — SODIUM CHLORIDE 0.9 % IV BOLUS (SEPSIS)
1000.0000 mL | Freq: Once | INTRAVENOUS | Status: AC
  Administered 2017-02-17: 1000 mL via INTRAVENOUS

## 2017-02-17 MED ORDER — DOCUSATE SODIUM 100 MG PO CAPS
100.0000 mg | ORAL_CAPSULE | Freq: Two times a day (BID) | ORAL | Status: DC
  Administered 2017-02-17 – 2017-02-21 (×8): 100 mg via ORAL
  Filled 2017-02-17 (×8): qty 1

## 2017-02-17 NOTE — Progress Notes (Signed)
PROGRESS NOTE  Sheryl [name]  VEH:[id number] DOB: 1970-10-07 DOA: 02/16/2017 PCP: [name], MD  Brief Narrative:   Sheryl [name] is a 47 y.o. 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  She had a relapse in January 2018 and she has been receiving chemotherapy at the [facility].  She also has chronic hepatitis B for which she was previously taking entecavir but which stopped over the summer of 2018 and she only restarted this medication about two weeks prior to admission.  Her last Hep B viral load was in December and was 82,700,000 (HIGH).  She presents with a 3-week history of anxorexia, fatigue, nausea, and RUQ pressure.  She has also been having fevers and chills.  A week prior to admission she was diagnosed with CAP and given a prescription for levofloxacin which helped her cough, but she continued to have fevers.  She was seen in the [place] and directly admitted when she was found to be dehydrated and with severely elevated LFTs.  Upon admission, she developed a fever to 102F and was started on broad spectrum antibiotics for possible sepsis.  She was hypotensive overnight and required several boluses of NS to stabilize her blood pressures.     Assessment & Plan:    SIRS, possible sepsis (sinus tachycardia, high fever, hypotension, mild lactic acidosis)  Patient at risk for bacteria infection due to her leukemia and chemotherapy with port present.  This may also be due to her acute hepatitis or progression of her malignancy.   -  Blood cultures, including from her port, pending -  UA negative, culture pending -  CXR:  No evidence of pneumonia -  Flu PCR -  Continue Vancomycin and zosyn -  Ibuprofen prn fever  Transaminitis without liver failure, likely due to acute exacerbation of hepatitis B plus recent fluconazole administration.  Patient reports taking entecavir x 2 weeks.  Improving -  Hepatitis B Quant PCR -   Acute hepatitis panel:  Positive for Hep B, but no acute hep A or C superimposed -  Tylenol level:  negative -  ID assistance appreciated -  Tenofovir ordered per ID -  LFTs trending down and INR trending up -  RUQ [place] with doppler:  Benign appearing cyst, but otherwise, no explanation for abdominal pain and transaminitis  Dehydration with elevated creatinine at 1.35 prior to admission, but received IVF at cancer center and on repeat at time of admission, creatinine back to 0.79.   -  Continue IVF  Anorexia due to hepatitis and cancer -  Nutrition consult -  Ensure -  Advance to regular diet when able  Thrush -  Nystatin  Plasma cell leukemia, chemotherapy on hold.   -  Dr. Burr Medico added to rounding list  Pancytopenia.  She has had lability in her WBC and hemoglobin before to this degree but she has never been thrombocytopenic before.  Suspect that this is hemodilutional and due to inflammatory process (high fevers).   - Repeat CBC in AM - hold lovenox given rapid drop in platelets since yesterday  Constipation -  Bisacodyl suppository -  Scheduled miralax and senna  Hypokalemia, oral potassium repletion  DVT prophylaxis: lovenox  Code Status: full Family Communication:  Patient and her daughter [name] by phone.  Disposition Plan:  Likely to home in a few days    Consultants:   Infectious disease  Procedures:  RUQ [place]  Antimicrobials:  Anti-infectives (From admission, onward)   Start     Dose/Rate Route Frequency Ordered Stop   02/17/17 1800  vancomycin (VANCOCIN) 1,250 mg in sodium chloride 0.9 % 250 mL IVPB     1,250 mg 166.7 mL/hr over 90 Minutes Intravenous Every 24 hours 02/16/17 1903     02/16/17 2300  piperacillin-tazobactam (ZOSYN) IVPB 3.375 g     3.375 g 12.5 mL/hr over 240 Minutes Intravenous Every 8 hours 02/16/17 1903     02/16/17 2200  acyclovir (ZOVIRAX) tablet 800 mg     800 mg Oral 2 times daily 02/16/17 1644     02/16/17 1800  vancomycin  (VANCOCIN) IVPB 1000 mg/200 mL premix  Status:  Discontinued     1,000 mg 200 mL/hr over 60 Minutes Intravenous  Once 02/16/17 1646 02/16/17 1651   02/16/17 1700  tenofovir (VIREAD) tablet 300 mg     300 mg Oral Daily 02/16/17 1545     02/16/17 1700  piperacillin-tazobactam (ZOSYN) IVPB 3.375 g     3.375 g 100 mL/hr over 30 Minutes Intravenous  Once 02/16/17 1646 02/16/17 1739   02/16/17 1700  vancomycin (VANCOCIN) 1,250 mg in sodium chloride 0.9 % 250 mL IVPB     1,250 mg 166.7 mL/hr over 90 Minutes Intravenous  Once 02/16/17 1654 02/16/17 2238       Subjective:  Having fevers and chills.  Denies shortness of breath, chest pains.  She has some mouth pain but denies the sensation of food getting stuck in her throat or chest pains when eating.  Her abdominal pain is a little bit better today.  She is still having nausea and was heaving during part of my interview.  No bowel movements since admission.  Objective: Vitals:   02/16/17 2056 02/16/17 2350 02/17/17 0135 02/17/17 0540  BP: (!) 95/50 (!) 82/50 104/71 102/63  Pulse: 85 74 86 83  Resp: 20 20 18 20   Temp: 98.8 F (37.1 C) 97.7 F (36.5 C) 98 F (36.7 C) 98.3 F (36.8 C)  TempSrc: Oral Oral Oral Oral  SpO2: 99%  100% 100%  Weight:      Height:        Intake/Output Summary (Last 24 hours) at 02/17/2017 1138 Last data filed at 02/17/2017 0600 Gross per 24 hour  Intake 4893.33 ml  Output 1050 ml  Net 3843.33 ml   Filed Weights   02/16/17 1520  Weight: 60.5 kg (133 lb 6.1 oz)    Examination:  General exam:  Adult female.  No acute distress.  HEENT:  NCAT, MMM Respiratory system: Clear to auscultation bilaterally Cardiovascular system: Regular rate and rhythm, normal S1/S2. No murmurs, rubs, gallops or clicks.  Warm extremities Gastrointestinal system: Hypoactive bowel sounds, soft, mildly distended, nontender MSK:  Normal tone and bulk, no lower extremity edema Neuro:  Grossly intact    Data Reviewed: I have  personally reviewed following labs and imaging studies  CBC: Recent Labs  Lab 02/16/17 1134 02/17/17 0400  WBC 6.4 3.3*  NEUTROABS 4.8  --   HGB 12.1 9.2*  HCT 36.7 27.3*  MCV 80.0 77.6*  PLT 149 77*   Basic Metabolic Panel: Recent Labs  Lab 02/16/17 1134 02/16/17 1609 02/17/17 0400  NA 135* 132* 138  K 4.0 3.8 3.2*  CL 101 101 108  CO2 20* 23 23  GLUCOSE 101 96 83  BUN 36* 28* 15  CREATININE 1.35* 0.79 0.53  CALCIUM 10.6* 9.1 7.8*   GFR: Estimated Creatinine Clearance: 71.1 mL/min (by  C-G formula based on SCr of 0.53 mg/dL). Liver Function Tests: Recent Labs  Lab 02/16/17 1134 02/16/17 1609 02/17/17 0400  AST 4,113* 3,274* 1,955*  ALT 949* 743* 596*  ALKPHOS 287* 240* 192*  BILITOT 2.1* 1.8* 1.8*  PROT 6.5 5.7* 4.6*  ALBUMIN 3.2* 2.9* 2.4*   Recent Labs  Lab 02/16/17 1609  LIPASE 66*   No results for input(s): AMMONIA in the last 168 hours. Coagulation Profile: Recent Labs  Lab 02/16/17 1609 02/17/17 0400  INR 1.34 1.52   Cardiac Enzymes: No results for input(s): CKTOTAL, CKMB, CKMBINDEX, TROPONINI in the last 168 hours. BNP (last 3 results) No results for input(s): PROBNP in the last 8760 hours. HbA1C: No results for input(s): HGBA1C in the last 72 hours. CBG: No results for input(s): GLUCAP in the last 168 hours. Lipid Profile: No results for input(s): CHOL, HDL, LDLCALC, TRIG, CHOLHDL, LDLDIRECT in the last 72 hours. Thyroid Function Tests: No results for input(s): TSH, T4TOTAL, FREET4, T3FREE, THYROIDAB in the last 72 hours. Anemia Panel: No results for input(s): VITAMINB12, FOLATE, FERRITIN, TIBC, IRON, RETICCTPCT in the last 72 hours. Urine analysis:    Component Value Date/Time   COLORURINE YELLOW 02/16/2017 1742   APPEARANCEUR CLEAR 02/16/2017 1742   LABSPEC 1.011 02/16/2017 1742   LABSPEC 1.010 04/09/2016 1356   PHURINE 6.0 02/16/2017 1742   GLUCOSEU NEGATIVE 02/16/2017 1742   GLUCOSEU Negative 04/09/2016 1356   HGBUR NEGATIVE  02/16/2017 1742   BILIRUBINUR NEGATIVE 02/16/2017 1742   BILIRUBINUR Negative 04/09/2016 1356   KETONESUR NEGATIVE 02/16/2017 1742   PROTEINUR NEGATIVE 02/16/2017 1742   UROBILINOGEN 0.2 04/09/2016 1356   NITRITE NEGATIVE 02/16/2017 1742   LEUKOCYTESUR NEGATIVE 02/16/2017 1742   LEUKOCYTESUR Moderate 04/09/2016 1356   Sepsis Labs: @LABRCNTIP (procalcitonin:4,lacticidven:4)  )No results found for this or any previous visit (from the past 240 hour(s)).    Radiology Studies: Dg Chest 2 View  Result Date: 02/16/2017 CLINICAL DATA:  Fatigue.  Cough and fever. EXAM: CHEST  2 VIEW COMPARISON:  02/09/2017 FINDINGS: Right chest wall port a catheter is noted with tip at the level of the cavoatrial junction. Heart size is normal. No pleural effusion or edema. No airspace opacities identified. IMPRESSION: 1. No active cardiopulmonary abnormality. Electronically Signed   By: Kerby Moors M.D.   On: 02/16/2017 19:29   US Abdomen Limited Ruq  Result Date: 02/17/2017 CLINICAL DATA:  Transaminitis. EXAM: ULTRASOUND ABDOMEN LIMITED RIGHT UPPER QUADRANT COMPARISON:  Abdominal ultrasound of February 16, 2016 FINDINGS: The study is somewhat limited due to the patient's body habitus and limited acoustical windows. Gallbladder: No gallstones or wall thickening visualized. No sonographic Murphy sign noted by sonographer. Common bile duct: Diameter: 2.9 mm Liver: The hepatic echotexture is normal. In the posterior aspect of the right lobe there is a simple appearing cyst measuring 1.4 x 1.0 x 1.1 cm. There is no intrahepatic ductal dilation. Portal vein is patent on color Doppler imaging with normal direction of blood flow towards the liver. IMPRESSION: Normal appearance of the gallbladder and common bile duct. Simple appearing right lobe hepatic cyst. The liver is otherwise unremarkable where visualized. Electronically Signed   By: David  Martinique M.D.   On: 02/17/2017 11:36     Scheduled Meds: . acyclovir  800 mg  Oral BID  . feeding supplement (ENSURE ENLIVE)  237 mL Oral BID BM  . nystatin  5 mL Oral QID  . pantoprazole  40 mg Oral Daily  . tenofovir  300 mg Oral Daily  Continuous Infusions: . sodium chloride 100 mL/hr at 02/16/17 1855  . piperacillin-tazobactam (ZOSYN)  IV Stopped (02/17/17 1008)  . vancomycin       LOS: 1 day    Time spent: 30 min    Janece Canterbury, MD [facility] Pager [phone]  If 7PM-7AM, please contact night-coverage [email or web address] Password [employer] 02/17/2017, 11:38 AM

## 2017-02-17 NOTE — Progress Notes (Signed)
[facility] for Infectious Disease    Date of Admission:  02/16/2017   Total days of antibiotics 2        Day 2 tenofovir        Day 2 vanco/piptazo   ID: Sheryl [name] is a 47 y.o. female with transaminitis and fevers Principal Problem:   Abnormal LFTs Active Problems:   Hepatitis B   Dehydration   Fever   Transaminitis   Chills with fever   SIRS (systemic inflammatory response syndrome) (HCC)   Thrush    Subjective: Afebrile. Required IVF for hypotension last night  I called specialty pharmacy and the reported that entecavir was mailed to her on 02/06/17 Medications:  . acyclovir  800 mg Oral BID  . feeding supplement (ENSURE ENLIVE)  237 mL Oral BID BM  . nystatin  5 mL Oral QID  . pantoprazole  40 mg Oral Daily  . tenofovir  300 mg Oral Daily    Objective: Vital signs in last 24 hours: Temp:  [97.7 F (36.5 C)-102.3 F (39.1 C)] 99.4 F (37.4 C) (01/15 1300) Pulse Rate:  [74-104] 87 (01/15 1300) Resp:  [18-22] 18 (01/15 1300) BP: (82-121)/(50-71) 111/61 (01/15 1300) SpO2:  [97 %-100 %] 100 % (01/15 1300) Weight:  [133 lb 6.1 oz (60.5 kg)] 133 lb 6.1 oz (60.5 kg) (01/14 1520)    Lab Results Recent Labs    02/16/17 1134 02/16/17 1609 02/17/17 0400  WBC 6.4  --  3.3*  HGB 12.1  --  9.2*  HCT 36.7  --  27.3*  NA 135* 132* 138  K 4.0 3.8 3.2*  CL 101 101 108  CO2 20* 23 23  BUN 36* 28* 15  CREATININE 1.35* 0.79 0.53   Liver Panel Recent Labs    02/16/17 1609 02/17/17 0400  PROT 5.7* 4.6*  ALBUMIN 2.9* 2.4*  AST 3,274* 1,955*  ALT 743* 596*  ALKPHOS 240* 192*  BILITOT 1.8* 1.8*    Microbiology: Blood cx 1/14 x 3 Urine cx 1/14 pending Studies/Results: Dg Chest 2 View  Result Date: 02/16/2017 CLINICAL DATA:  Fatigue.  Cough and fever. EXAM: CHEST  2 VIEW COMPARISON:  02/09/2017 FINDINGS: Right chest wall port a catheter is noted with tip at the level of the cavoatrial junction. Heart size is normal. No pleural effusion or edema. No  airspace opacities identified. IMPRESSION: 1. No active cardiopulmonary abnormality. Electronically Signed   By: Kerby Moors M.D.   On: 02/16/2017 19:29   US Liver Doppler  Result Date: 02/17/2017 CLINICAL DATA:  Right upper abdominal pain, transaminitis EXAM: DUPLEX ULTRASOUND OF LIVER TECHNIQUE: Color and duplex Doppler ultrasound was performed to evaluate the hepatic in-flow and out-flow vessels. COMPARISON:  01/15/2017 and previous FINDINGS: Portal Vein 1.2 cm diameter. No evidence of occlusion or thrombus. Velocities (all hepatopetal): Main:  24-29 cm/sec Right:  18 cm/sec Left:  16 cm/sec Hepatic Vein Velocities (all hepatofugal): Right:  29 cm/sec Middle:  57 cm/sec Left:  47 cm/sec Intrahepatic IVC patent velocities up to 156 cm/second. Hepatic Artery Velocity:  163 cm/sec, RI = 0.6 Spleen 7.7 x 4.4 x 9.4 cm (volume = 170 cm^3). Complex cystic process with shadowing components are around the periphery of the splenic capsule as has been demonstrated on imaging dating back to 10/23/2008. No evidence of splenic vein occlusion or thrombus; velocity: 23 cm/sec Varices: None seen Ascites: None seen IMPRESSION: 1. Unremarkable hepatic vascular Doppler ultrasound evaluation. 2. Chronic complex cystic perisplenic process as has been  described previously. Electronically Signed   By: Lucrezia Europe M.D.   On: 02/17/2017 12:38   US Abdomen Limited Ruq  Result Date: 02/17/2017 CLINICAL DATA:  Transaminitis. EXAM: ULTRASOUND ABDOMEN LIMITED RIGHT UPPER QUADRANT COMPARISON:  Abdominal ultrasound of February 16, 2016 FINDINGS: The study is somewhat limited due to the patient's body habitus and limited acoustical windows. Gallbladder: No gallstones or wall thickening visualized. No sonographic Murphy sign noted by sonographer. Common bile duct: Diameter: 2.9 mm Liver: The hepatic echotexture is normal. In the posterior aspect of the right lobe there is a simple appearing cyst measuring 1.4 x 1.0 x 1.1 cm. There is no  intrahepatic ductal dilation. Portal vein is patent on color Doppler imaging with normal direction of blood flow towards the liver. IMPRESSION: Normal appearance of the gallbladder and common bile duct. Simple appearing right lobe hepatic cyst. The liver is otherwise unremarkable where visualized. Electronically Signed   By: David  Martinique M.D.   On: 02/17/2017 11:36     Assessment/Plan:  Chronic hep B flare = it appears that she had only been on entecavir for 9 days prior to presenting to hospital. Her transaminitis are trending down slightly today.  -We have changed her regimen to tenofovir.  - She had RUQ u/s that showed 1.4 x 1.0 x1.1 in RL. (which is also described in U/S from [facility] in April of 2017)  Pneumonia = CXR appears normal per radiologist read. I would discontinue vancomycin and piptazo. Previous findings are likely from her previous pneumonia  Hypotension = received additional IVF last night due to SBP in 90s. She reported dehydration coming into the hospital   [facility] Cell: [phone] Pager: [phone]  02/17/2017, 3:02 PM

## 2017-02-17 NOTE — Progress Notes (Signed)
Sheryl [name]   DOB:04-24-1970   KT#:[id number]   DSK#:[id number]  Oncology follow up note   Subjective: Pt was admitted from my office for fever, hypertension, sepsis, and significantly elevated liver enzymes.  She was started on broad antibiotics vancomycin and Zosyn, feels slightly better today.  Still has low-grade fever, vital signs stable.   Objective:  Vitals:   02/17/17 1300 02/17/17 2050  BP: 111/61 (!) 104/54  Pulse: 87 94  Resp: 18 19  Temp: 99.4 F (37.4 C) 100.3 F (37.9 C)  SpO2: 100% 98%    Body mass index is 23.63 kg/m.  Intake/Output Summary (Last 24 hours) at 02/17/2017 2231 Last data filed at 02/17/2017 1837 Gross per 24 hour  Intake 4701.67 ml  Output 750 ml  Net 3951.67 ml     Sclerae unicteric  Oropharynx clear  No peripheral adenopathy  Lungs clear -- no rales or rhonchi  Heart regular rate and rhythm  Abdomen benign  MSK no focal spinal tenderness, no peripheral edema  Neuro nonfocal   CBG (last 3)  No results for input(s): GLUCAP in the last 72 hours.   Labs:  Lab Results  Component Value Date   WBC 3.3 (L) 02/17/2017   HGB 9.2 (L) 02/17/2017   HCT 27.3 (L) 02/17/2017   MCV 77.6 (L) 02/17/2017   PLT 77 (L) 02/17/2017   NEUTROABS 4.8 02/16/2017   CMP Latest Ref Rng & Units 02/17/2017 02/16/2017 02/16/2017  Glucose 65 - 99 mg/dL 83 96 101  BUN 6 - 20 mg/dL 15 28(H) 36(H)  Creatinine 0.44 - 1.00 mg/dL 0.53 0.79 1.35(H)  Sodium 135 - 145 mmol/L 138 132(L) 135(L)  Potassium 3.5 - 5.1 mmol/L 3.2(L) 3.8 4.0  Chloride 101 - 111 mmol/L 108 101 101  CO2 22 - 32 mmol/L 23 23 20(L)  Calcium 8.9 - 10.3 mg/dL 7.8(L) 9.1 10.6(H)  Total Protein 6.5 - 8.1 g/dL 4.6(L) 5.7(L) 6.5  Total Bilirubin 0.3 - 1.2 mg/dL 1.8(H) 1.8(H) 2.1(H)  Alkaline Phos 38 - 126 U/L 192(H) 240(H) 287(H)  AST 15 - 41 U/L 1,955(H) 3,274(H) 4,113(HH)  ALT 14 - 54 U/L 596(H) 743(H) 949(HH)     Urine Studies No results for input(s): UHGB, CRYS in the last 72 hours.  Invalid  input(s): UACOL, UAPR, USPG, UPH, UTP, UGL, UKET, UBIL, UNIT, UROB, ULEU, UEPI, UWBC, URBC, UBAC, CAST, [place], [place]  Basic Metabolic Panel: Recent Labs  Lab 02/16/17 1134 02/16/17 1609 02/17/17 0400  NA 135* 132* 138  K 4.0 3.8 3.2*  CL 101 101 108  CO2 20* 23 23  GLUCOSE 101 96 83  BUN 36* 28* 15  CREATININE 1.35* 0.79 0.53  CALCIUM 10.6* 9.1 7.8*   GFR Estimated Creatinine Clearance: 71.1 mL/min (by C-G formula based on SCr of 0.53 mg/dL). Liver Function Tests: Recent Labs  Lab 02/16/17 1134 02/16/17 1609 02/17/17 0400  AST 4,113* 3,274* 1,955*  ALT 949* 743* 596*  ALKPHOS 287* 240* 192*  BILITOT 2.1* 1.8* 1.8*  PROT 6.5 5.7* 4.6*  ALBUMIN 3.2* 2.9* 2.4*   Recent Labs  Lab 02/16/17 1609  LIPASE 66*   No results for input(s): AMMONIA in the last 168 hours. Coagulation profile Recent Labs  Lab 02/16/17 1609 02/17/17 0400  INR 1.34 1.52    CBC: Recent Labs  Lab 02/16/17 1134 02/17/17 0400  WBC 6.4 3.3*  NEUTROABS 4.8  --   HGB 12.1 9.2*  HCT 36.7 27.3*  MCV 80.0 77.6*  PLT 149 77*   Cardiac Enzymes:  No results for input(s): CKTOTAL, CKMB, CKMBINDEX, TROPONINI in the last 168 hours. BNP: Invalid input(s): POCBNP CBG: No results for input(s): GLUCAP in the last 168 hours. D-Dimer No results for input(s): DDIMER in the last 72 hours. Hgb A1c No results for input(s): HGBA1C in the last 72 hours. Lipid Profile No results for input(s): CHOL, HDL, LDLCALC, TRIG, CHOLHDL, LDLDIRECT in the last 72 hours. Thyroid function studies No results for input(s): TSH, T4TOTAL, T3FREE, THYROIDAB in the last 72 hours.  Invalid input(s): FREET3 Anemia work up No results for input(s): VITAMINB12, FOLATE, FERRITIN, TIBC, IRON, RETICCTPCT in the last 72 hours. Microbiology Recent Results (from the past 240 hour(s))  Culture, blood (Routine X 2) w Reflex to ID Panel     Status: None (Preliminary result)   Collection Time: 02/16/17  4:51 PM  Result Value Ref Range  Status   Specimen Description BLOOD RIGHT HAND  Final   Special Requests IN PEDIATRIC BOTTLE Blood Culture adequate volume  Final   Culture   Final    NO GROWTH < 24 HOURS Performed at [facility], [address], [place], [place] [address]    Report Status PENDING  Incomplete  Culture, blood (Routine X 2) w Reflex to ID Panel     Status: None (Preliminary result)   Collection Time: 02/16/17  4:58 PM  Result Value Ref Range Status   Specimen Description BLOOD RIGHT HAND  Final   Special Requests IN PEDIATRIC BOTTLE Blood Culture adequate volume  Final   Culture   Final    NO GROWTH < 24 HOURS Performed at [facility], [place] [address] [place], [place] [address]    Report Status PENDING  Incomplete  Culture, blood (single) w Reflex to ID Panel     Status: None (Preliminary result)   Collection Time: 02/16/17  5:44 PM  Result Value Ref Range Status   Specimen Description BLOOD PORTA CATH  Final   Special Requests   Final    BOTTLES DRAWN AEROBIC AND ANAEROBIC Blood Culture adequate volume   Culture   Final    NO GROWTH < 24 HOURS Performed at [facility], [place] [address], [place], [place] [address]    Report Status PENDING  Incomplete  MRSA PCR Screening     Status: None   Collection Time: 02/17/17  7:32 AM  Result Value Ref Range Status   MRSA by PCR NEGATIVE NEGATIVE Final    Comment:        The GeneXpert MRSA Assay (FDA approved for NASAL specimens only), is one component of a comprehensive MRSA colonization surveillance program. It is not intended to diagnose MRSA infection nor to guide or monitor treatment for MRSA infections.       Studies:  Dg Chest 2 View  Result Date: 02/16/2017 CLINICAL DATA:  Fatigue.  Cough and fever. EXAM: CHEST  2 VIEW COMPARISON:  02/09/2017 FINDINGS: Right chest wall port a catheter is noted with tip at the level of the cavoatrial junction. Heart size is normal. No pleural effusion or edema. No airspace opacities  identified. IMPRESSION: 1. No active cardiopulmonary abnormality. Electronically Signed   By: Kerby Moors M.D.   On: 02/16/2017 19:29   US Liver Doppler  Result Date: 02/17/2017 CLINICAL DATA:  Right upper abdominal pain, transaminitis EXAM: DUPLEX ULTRASOUND OF LIVER TECHNIQUE: Color and duplex Doppler ultrasound was performed to evaluate the hepatic in-flow and out-flow vessels. COMPARISON:  01/15/2017 and previous FINDINGS: Portal Vein 1.2  cm diameter. No evidence of occlusion or thrombus. Velocities (all hepatopetal): Main:  24-29 cm/sec Right:  18 cm/sec Left:  16 cm/sec Hepatic Vein Velocities (all hepatofugal): Right:  29 cm/sec Middle:  57 cm/sec Left:  47 cm/sec Intrahepatic IVC patent velocities up to 156 cm/second. Hepatic Artery Velocity:  163 cm/sec, RI = 0.6 Spleen 7.7 x 4.4 x 9.4 cm (volume = 170 cm^3). Complex cystic process with shadowing components are around the periphery of the splenic capsule as has been demonstrated on imaging dating back to 10/23/2008. No evidence of splenic vein occlusion or thrombus; velocity: 23 cm/sec Varices: None seen Ascites: None seen IMPRESSION: 1. Unremarkable hepatic vascular Doppler ultrasound evaluation. 2. Chronic complex cystic perisplenic process as has been described previously. Electronically Signed   By: Lucrezia Europe M.D.   On: 02/17/2017 12:38   US Abdomen Limited Ruq  Result Date: 02/17/2017 CLINICAL DATA:  Transaminitis. EXAM: ULTRASOUND ABDOMEN LIMITED RIGHT UPPER QUADRANT COMPARISON:  Abdominal ultrasound of February 16, 2016 FINDINGS: The study is somewhat limited due to the patient's body habitus and limited acoustical windows. Gallbladder: No gallstones or wall thickening visualized. No sonographic Murphy sign noted by sonographer. Common bile duct: Diameter: 2.9 mm Liver: The hepatic echotexture is normal. In the posterior aspect of the right lobe there is a simple appearing cyst measuring 1.4 x 1.0 x 1.1 cm. There is no intrahepatic ductal  dilation. Portal vein is patent on color Doppler imaging with normal direction of blood flow towards the liver. IMPRESSION: Normal appearance of the gallbladder and common bile duct. Simple appearing right lobe hepatic cyst. The liver is otherwise unremarkable where visualized. Electronically Signed   By: David  Martinique M.D.   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 Sepsis, unclear source 2.  Transaminitis 3.  Dehydration, mild AK I, resolving 4. Relapsed acute plasma cell leukemia, in remission in 5.  Pancytopenia secondary to chemotherapy and sepsis  Plan:  -Her abdominal ultrasound and Doppler of liver in the gallbladder was unremarkable -I agree with broad antibiotics, cultures are pending -Her overall condition has improved with IV fluids and antibiotics, transaminitis improving -Her chemo has been held since last week, her original bone marrow biopsy was negative for residual plasma cell leukemia. -Continue supportive care -I appreciate the excellent care from the hospitalist team -I will f/u as needed, will see her closely after discharge  -I will be out of office 1/17-1/20, please call my on-call partner if needed.    Truitt Merle  02/17/2017

## 2017-02-17 NOTE — Care Management Note (Signed)
Case Management Note  Patient Details  Name: Sheryl [name] MRN: [id number] Date of Birth: 11-Jun-1970  Subjective/Objective: 47 y/o f admitted w/Abnormal LFT's. Hx: Plasma cell leukemia. From home. ID/Onc cons.                    Action/Plan:d/c home.   Expected Discharge Date:  (unknown)               Expected Discharge Plan:  Home/Self Care  In-House Referral:     Discharge planning Services  CM Consult  Post Acute Care Choice:    Choice offered to:     DME Arranged:    DME Agency:     HH Arranged:    HH Agency:     Status of Service:  In process, will continue to follow  If discussed at Long Length of Stay Meetings, dates discussed:    Additional Comments:  Dessa Phi, RN 02/17/2017, 11:03 AM

## 2017-02-18 LAB — COMPREHENSIVE METABOLIC PANEL
ALBUMIN: 2.5 g/dL — AB (ref 3.5–5.0)
ALK PHOS: 180 U/L — AB (ref 38–126)
ALT: 717 U/L — AB (ref 14–54)
ANION GAP: 7 (ref 5–15)
AST: 3477 U/L — ABNORMAL HIGH (ref 15–41)
BUN: 9 mg/dL (ref 6–20)
CALCIUM: 7.9 mg/dL — AB (ref 8.9–10.3)
CHLORIDE: 102 mmol/L (ref 101–111)
CO2: 26 mmol/L (ref 22–32)
Creatinine, Ser: 0.59 mg/dL (ref 0.44–1.00)
GFR calc Af Amer: >60 mL/min (ref >60)
GFR calc non Af Amer: >60 mL/min (ref >60)
GLUCOSE: 75 mg/dL (ref 65–99)
Potassium: 3.7 mmol/L (ref 3.5–5.1)
SODIUM: 135 mmol/L (ref 135–145)
Total Bilirubin: 3.1 mg/dL — ABNORMAL HIGH (ref 0.3–1.2)
Total Protein: 4.9 g/dL — ABNORMAL LOW (ref 6.5–8.1)

## 2017-02-18 LAB — URINE CULTURE: Culture: <10000 — AB

## 2017-02-18 LAB — PROTIME-INR
INR: 1.47
PROTHROMBIN TIME: 17.7 s — AB (ref 11.4–15.2)

## 2017-02-18 LAB — CBC
HCT: 26.6 % — ABNORMAL LOW (ref 36.0–46.0)
HEMOGLOBIN: 9 g/dL — AB (ref 12.0–15.0)
MCH: 26.2 pg (ref 26.0–34.0)
MCHC: 33.8 g/dL (ref 30.0–36.0)
MCV: 77.6 fL — AB (ref 78.0–100.0)
Platelets: 78 10*3/uL — ABNORMAL LOW (ref 150–400)
RBC: 3.43 MIL/uL — ABNORMAL LOW (ref 3.87–5.11)
RDW: 15.6 % — ABNORMAL HIGH (ref 11.5–15.5)
WBC: 3.3 10*3/uL — ABNORMAL LOW (ref 4.0–10.5)

## 2017-02-18 LAB — PROTEIN ELECTROPHORESIS, SERUM
A/G Ratio: 0.9 (ref 0.7–1.7)
ALPHA-2-GLOBULIN: 1 g/dL (ref 0.4–1.0)
Albumin ELP: 2.9 g/dL (ref 2.9–4.4)
Alpha-1-Globulin: 0.4 g/dL (ref 0.0–0.4)
Beta Globulin: 0.9 g/dL (ref 0.7–1.3)
GAMMA GLOBULIN: 1 g/dL (ref 0.4–1.8)
GLOBULIN, TOTAL: 3.2 g/dL (ref 2.2–3.9)
Total Protein ELP: 6.1 g/dL (ref 6.0–8.5)

## 2017-02-18 NOTE — Progress Notes (Signed)
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 She had a relapse in January 2018 and she has been receiving chemotherapy at the [facility].  She also has chronic hepatitis B for which she was previously taking entecavir but which stopped over the summer of 2018 and she only restarted this medication about two weeks prior to admission. Her last Hep B viral load was in December and was 82,700,000 (HIGH). She presents with a 3-week history of anxorexia, fatigue, nausea, and RUQ pressure.  She has also been having fevers and chills.  A week prior to admission she was diagnosed with CAP and given a prescription for levofloxacin which helped her cough, but she continued to have fevers.  She was seen in the [facility] and directly admitted when she was found to be dehydrated and with severely elevated LFTs.  Upon admission, she developed a fever to 102F and was started on broad spectrum antibiotics for possible sepsis.  She was hypotensive overnight and required several boluses of NS to stabilize her blood pressures.    Assessment & Plan:   Principal Problem:   Abnormal LFTs Active Problems:   Hepatitis B   Dehydration   Fever   Transaminitis   Chills with fever   SIRS (systemic inflammatory response syndrome) (HCC)   Thrush  SIRS,possible sepsis (sinus tachycardia, high fever, hypotension, mild lactic acidosis) Patient at risk for bacteria infection due to her leukemia and chemotherapy with port present. This may also be due to her acute hepatitis or progression of her malignancy.   - Blood cultures, including from her port, pending, currently NGTDx2 days - UA negative, urine cx with <10,000 colonies (insignificant growth) - CXR:  No evidence of pneumonia - Flu PCR - Continue  Vancomycin and zosyn -> will d/c vanc today and continue zosyn - Ibuprofen prn fever  Transaminitis without liver failure, likely due to acute exacerbation of hepatitis B plus recent fluconazole administration. Patient reports taking entecavir x 2 weeks.  - LFT's worsened today, will continue to monitor (AST, ALT, and bili - INR relatively stable) - Hepatitis B Quant PCR - 1,430,000 - Acute hepatitis panel:  Positive for Hep B, but no acute hep A or C superimposed - Tylenol level: negative - ID assistance appreciated - Tenofovir ordered per ID -> pt will continue this as outpatient instead of entecavir per ID - RUQ [place] with doppler:  Benign appearing cyst, but otherwise, no explanation for abdominal pain and transaminitis  Dehydrationwithelevated creatinine at 1.35prior to admission, but received IVF at cancer center and on repeat at time of admission. - improved, continue to monitor  Anorexia due to hepatitis and cancer - Nutrition consult - Ensure - Advance to regular diet when able  Thrush - Nystatin  Plasma cell leukemia, chemotherapy on hold.   - Dr. Burr Medico added to rounding list  Pancytopenia.  She has had lability in her WBC and hemoglobin before to this degree but she has never been thrombocytopenic before.  Suspect that this is hemodilutional and due to inflammatory process (high fevers).   - Repeat CBC in AM - hold lovenox   Constipation -  Bisacodyl suppository -  Scheduled miralax and senna  Hypokalemia, oral potassium repletion  DVT prophylaxis: SCD Code Status: full  Family Communication: parents at bedside Disposition Plan:  pending improvement   Consultants:   ID  Oncology  Procedures:   RUQ [place] with liver doppler  Antimicrobials: Anti-infectives (From admission, onward)   Start     Dose/Rate Route Frequency Ordered Stop   02/17/17 1800  vancomycin (VANCOCIN) 1,250 mg in sodium chloride 0.9 % 250 mL IVPB  Status:  Discontinued      1,250 mg 166.7 mL/hr over 90 Minutes Intravenous Every 24 hours 02/16/17 1903 02/18/17 1726   02/16/17 2300  piperacillin-tazobactam (ZOSYN) IVPB 3.375 g     3.375 g 12.5 mL/hr over 240 Minutes Intravenous Every 8 hours 02/16/17 1903     02/16/17 2200  acyclovir (ZOVIRAX) tablet 800 mg     800 mg Oral 2 times daily 02/16/17 1644     02/16/17 1800  vancomycin (VANCOCIN) IVPB 1000 mg/200 mL premix  Status:  Discontinued     1,000 mg 200 mL/hr over 60 Minutes Intravenous  Once 02/16/17 1646 02/16/17 1651   02/16/17 1700  tenofovir (VIREAD) tablet 300 mg     300 mg Oral Daily 02/16/17 1545     02/16/17 1700  piperacillin-tazobactam (ZOSYN) IVPB 3.375 g     3.375 g 100 mL/hr over 30 Minutes Intravenous  Once 02/16/17 1646 02/16/17 1739   02/16/17 1700  vancomycin (VANCOCIN) 1,250 mg in sodium chloride 0.9 % 250 mL IVPB     1,250 mg 166.7 mL/hr over 90 Minutes Intravenous  Once 02/16/17 1654 02/16/17 2238     Subjective: Feeling better.  No pain or nausea. Interpreter used.   Objective: Vitals:   02/17/17 1300 02/17/17 2050 02/18/17 0517 02/18/17 1318  BP: 111/61 (!) 104/54 (!) 100/56 (!) 96/55  Pulse: 87 94 63 91  Resp: 18 19 18 19   Temp: 99.4 F (37.4 C) 100.3 F (37.9 C) 98.3 F (36.8 C) 100.1 F (37.8 C)  TempSrc: Oral Oral Oral Oral  SpO2: 100% 98% 98% 97%  Weight:      Height:        Intake/Output Summary (Last 24 hours) at 02/18/2017 1726 Last data filed at 02/18/2017 1400 Gross per 24 hour  Intake 3760 ml  Output -  Net 3760 ml   Filed Weights   02/16/17 1520  Weight: 60.5 kg (133 lb 6.1 oz)    Examination:  General exam: Appears calm and comfortable  Respiratory system: Clear to auscultation. Respiratory effort normal. Cardiovascular system: S1 & S2 heard, RRR. No JVD, murmurs, rubs, gallops or clicks. No pedal edema. Gastrointestinal system: Abdomen is nondistended, soft and nontender. No organomegaly or masses felt. Normal bowel sounds  heard. Central nervous system: Alert and oriented. No focal neurological deficits. Extremities: Symmetric 5 x 5 power. Skin: No rashes, lesions or ulcers Psychiatry: Judgement and insight appear normal. Mood & affect appropriate.     Data Reviewed: I have personally reviewed following labs and imaging studies  CBC: Recent Labs  Lab 02/16/17 1134 02/17/17 0400 02/18/17 0603  WBC 6.4 3.3* 3.3*  NEUTROABS 4.8  --   --   HGB 12.1 9.2* 9.0*  HCT 36.7 27.3* 26.6*  MCV 80.0 77.6* 77.6*  PLT 149 77* 78*   Basic Metabolic Panel: Recent Labs  Lab 02/16/17 1134 02/16/17 1609 02/17/17 0400 02/18/17 0603  NA 135* 132* 138 135  K 4.0 3.8 3.2* 3.7  CL 101 101 108 102  CO2 20* 23 23 26   GLUCOSE 101 96 83 75  BUN 36* 28* 15 9  CREATININE 1.35* 0.79 0.53 0.59  CALCIUM 10.6* 9.1  7.8* 7.9*   GFR: Estimated Creatinine Clearance: 71.1 mL/min (by C-G formula based on SCr of 0.59 mg/dL). Liver Function Tests: Recent Labs  Lab 02/16/17 1134 02/16/17 1609 02/17/17 0400 02/18/17 0603  AST 4,113* 3,274* 1,955* 3,477*  ALT 949* 743* 596* 717*  ALKPHOS 287* 240* 192* 180*  BILITOT 2.1* 1.8* 1.8* 3.1*  PROT 6.5 5.7* 4.6* 4.9*  ALBUMIN 3.2* 2.9* 2.4* 2.5*   Recent Labs  Lab 02/16/17 1609  LIPASE 66*   No results for input(s): AMMONIA in the last 168 hours. Coagulation Profile: Recent Labs  Lab 02/16/17 1609 02/17/17 0400 02/18/17 0603  INR 1.34 1.52 1.47   Cardiac Enzymes: No results for input(s): CKTOTAL, CKMB, CKMBINDEX, TROPONINI in the last 168 hours. BNP (last 3 results) No results for input(s): PROBNP in the last 8760 hours. HbA1C: No results for input(s): HGBA1C in the last 72 hours. CBG: No results for input(s): GLUCAP in the last 168 hours. Lipid Profile: No results for input(s): CHOL, HDL, LDLCALC, TRIG, CHOLHDL, LDLDIRECT in the last 72 hours. Thyroid Function Tests: No results for input(s): TSH, T4TOTAL, FREET4, T3FREE, THYROIDAB in the last 72  hours. Anemia Panel: No results for input(s): VITAMINB12, FOLATE, FERRITIN, TIBC, IRON, RETICCTPCT in the last 72 hours. Sepsis Labs: Recent Labs  Lab 02/16/17 1609 02/16/17 1744 02/16/17 2021  PROCALCITON 2.01  --   --   LATICACIDVEN  --  2.2* 1.7    Recent Results (from the past 240 hour(s))  Culture, blood (Routine X 2) w Reflex to ID Panel     Status: None (Preliminary result)   Collection Time: 02/16/17  4:51 PM  Result Value Ref Range Status   Specimen Description BLOOD RIGHT HAND  Final   Special Requests IN PEDIATRIC BOTTLE Blood Culture adequate volume  Final   Culture   Final    NO GROWTH 2 DAYS Performed at [facility], [place] [address] [place] [address] [place], [place] [address]    Report Status PENDING  Incomplete  Culture, blood (Routine X 2) w Reflex to ID Panel     Status: None (Preliminary result)   Collection Time: 02/16/17  4:58 PM  Result Value Ref Range Status   Specimen Description BLOOD RIGHT HAND  Final   Special Requests IN PEDIATRIC BOTTLE Blood Culture adequate volume  Final   Culture   Final    NO GROWTH 2 DAYS Performed at [facility], [place] [address], [place], [place] [address]    Report Status PENDING  Incomplete  Culture, Urine     Status: Abnormal   Collection Time: 02/16/17  5:42 PM  Result Value Ref Range Status   Specimen Description URINE, CLEAN CATCH  Final   Special Requests NONE  Final   Culture (A)  Final    <10,000 COLONIES/mL INSIGNIFICANT GROWTH Performed at [facility], [place] [address] [place], [place] [address]    Report Status 02/18/2017 FINAL  Final  Culture, blood (single) w Reflex to ID Panel     Status: None (Preliminary result)   Collection Time: 02/16/17  5:44 PM  Result Value Ref Range Status   Specimen Description BLOOD PORTA CATH  Final   Special Requests   Final    BOTTLES DRAWN AEROBIC AND ANAEROBIC Blood Culture adequate volume   Culture   Final    NO GROWTH 2 DAYS Performed at [facility],  [place] [address], [place], [place] [address]    Report Status PENDING  Incomplete  MRSA PCR Screening     Status: None   Collection Time: 02/17/17  7:32 AM  Result Value Ref Range Status   MRSA by PCR NEGATIVE NEGATIVE Final    Comment:        The GeneXpert MRSA Assay (FDA approved for NASAL specimens only), is one component of a comprehensive MRSA colonization surveillance program. It is not intended to diagnose MRSA infection nor to guide or monitor treatment for MRSA infections.          Radiology Studies: Dg Chest 2 View  Result Date: 02/16/2017 CLINICAL DATA:  Fatigue.  Cough and fever. EXAM: CHEST  2 VIEW COMPARISON:  02/09/2017 FINDINGS: Right chest wall port a catheter is noted with tip at the level of the cavoatrial junction. Heart size is normal. No pleural effusion or edema. No airspace opacities identified. IMPRESSION: 1. No active cardiopulmonary abnormality. Electronically Signed   By: Kerby Moors M.D.   On: 02/16/2017 19:29   US Liver Doppler  Result Date: 02/17/2017 CLINICAL DATA:  Right upper abdominal pain, transaminitis EXAM: DUPLEX ULTRASOUND OF LIVER TECHNIQUE: Color and duplex Doppler ultrasound was performed to evaluate the hepatic in-flow and out-flow vessels. COMPARISON:  01/15/2017 and previous FINDINGS: Portal Vein 1.2 cm diameter. No evidence of occlusion or thrombus. Velocities (all hepatopetal): Main:  24-29 cm/sec Right:  18 cm/sec Left:  16 cm/sec Hepatic Vein Velocities (all hepatofugal): Right:  29 cm/sec Middle:  57 cm/sec Left:  47 cm/sec Intrahepatic IVC patent velocities up to 156 cm/second. Hepatic Artery Velocity:  163 cm/sec, RI = 0.6 Spleen 7.7 x 4.4 x 9.4 cm (volume = 170 cm^3). Complex cystic process with shadowing components are around the periphery of the splenic capsule as has been demonstrated on imaging dating back to 10/23/2008. No evidence of splenic vein occlusion or thrombus; velocity: 23 cm/sec Varices: None seen Ascites: None seen  IMPRESSION: 1. Unremarkable hepatic vascular Doppler ultrasound evaluation. 2. Chronic complex cystic perisplenic process as has been described previously. Electronically Signed   By: Lucrezia Europe M.D.   On: 02/17/2017 12:38   US Abdomen Limited Ruq  Result Date: 02/17/2017 CLINICAL DATA:  Transaminitis. EXAM: ULTRASOUND ABDOMEN LIMITED RIGHT UPPER QUADRANT COMPARISON:  Abdominal ultrasound of February 16, 2016 FINDINGS: The study is somewhat limited due to the patient's body habitus and limited acoustical windows. Gallbladder: No gallstones or wall thickening visualized. No sonographic Murphy sign noted by sonographer. Common bile duct: Diameter: 2.9 mm Liver: The hepatic echotexture is normal. In the posterior aspect of the right lobe there is a simple appearing cyst measuring 1.4 x 1.0 x 1.1 cm. There is no intrahepatic ductal dilation. Portal vein is patent on color Doppler imaging with normal direction of blood flow towards the liver. IMPRESSION: Normal appearance of the gallbladder and common bile duct. Simple appearing right lobe hepatic cyst. The liver is otherwise unremarkable where visualized. Electronically Signed   By: David  Martinique M.D.   On: 02/17/2017 11:36        Scheduled Meds: . acyclovir  800 mg Oral BID  . bisacodyl  10 mg Rectal Once  . docusate sodium  100 mg Oral BID  . feeding supplement (ENSURE ENLIVE)  237 mL Oral BID BM  . nystatin  5 mL Oral QID  . pantoprazole  40 mg Oral Daily  . polyethylene glycol  17 g Oral BID  . senna  2 tablet Oral QHS  . tenofovir  300 mg Oral Daily   Continuous Infusions: . sodium chloride  100 mL/hr at 02/18/17 1717  . piperacillin-tazobactam (ZOSYN)  IV 3.375 g (02/18/17 1531)     LOS: 2 days    Time spent: over 23 min    Fayrene Helper, MD [facility] Pager [phone]  If 7PM-7AM, please contact night-coverage [email or web address] Password [facility] 02/18/2017, 5:26 PM

## 2017-02-18 NOTE — Progress Notes (Signed)
[facility]    Date of Admission:  02/16/2017   Total days of antibiotics 3        Day 3 tenofovir        Day 3 vanco/piptazo   ID: Sheryl [name] is a 47 y.o. female with transaminitis and fevers Principal Problem:   Abnormal LFTs Active Problems:   Hepatitis B   Dehydration   Fever   Transaminitis   Chills with fever   SIRS (systemic inflammatory response syndrome) (HCC)   Thrush    Subjective: Afebrile. Felling better. Denies cough (though she has occ cough during exam)  Labs revealed Hep B VL of 1.91M viral copies, AST in 3000s  Medications:  . acyclovir  800 mg Oral BID  . bisacodyl  10 mg Rectal Once  . docusate sodium  100 mg Oral BID  . feeding supplement (ENSURE ENLIVE)  237 mL Oral BID BM  . nystatin  5 mL Oral QID  . pantoprazole  40 mg Oral Daily  . polyethylene glycol  17 g Oral BID  . senna  2 tablet Oral QHS  . tenofovir  300 mg Oral Daily    Objective: Vital signs in last 24 hours: Temp:  [98.3 F (36.8 C)-100.3 F (37.9 C)] 100.1 F (37.8 C) (01/16 1318) Pulse Rate:  [63-94] 91 (01/16 1318) Resp:  [18-19] 19 (01/16 1318) BP: (96-104)/(54-56) 96/55 (01/16 1318) SpO2:  [97 %-98 %] 97 % (01/16 1318) Physical Exam  Constitutional:  oriented to person, place, and time. appears well-developed and well-nourished. No distress.  HENT: /AT, PERRLA, no scleral icterus Mouth/Throat: Oropharynx is clear and moist. No oropharyngeal exudate.  Chest wall = port is c/d/i Cardiovascular: Normal rate, regular rhythm and normal heart sounds. Exam reveals no gallop and no friction rub.  No murmur heard.  Pulmonary/Chest: Effort normal and breath sounds normal. No respiratory distress.  Mild cough with deep inspiration. Rhonchi clears with coughing Neck = supple, no nuchal rigidity Abdominal: Soft. Bowel sounds are normal.  exhibits no distension. There is no tenderness.  Lymphadenopathy: no cervical adenopathy. No axillary  adenopathy Neurological: alert and oriented to person, place, and time.  Skin: Skin is warm and dry. No rash noted. No erythema.  Psychiatric: a normal mood and affect.  behavior is normal.     Lab Results Recent Labs    02/17/17 0400 02/18/17 0603  WBC 3.3* 3.3*  HGB 9.2* 9.0*  HCT 27.3* 26.6*  NA 138 135  K 3.2* 3.7  CL 108 102  CO2 23 26  BUN 15 9  CREATININE 0.53 0.59   Liver Panel Recent Labs    02/17/17 0400 02/18/17 0603  PROT 4.6* 4.9*  ALBUMIN 2.4* 2.5*  AST 1,955* 3,477*  ALT 596* 717*  ALKPHOS 192* 180*  BILITOT 1.8* 3.1*    Microbiology: Blood cx 1/14 x 3 Urine cx 1/14 pending Studies/Results: Dg Chest 2 View  Result Date: 02/16/2017 CLINICAL DATA:  Fatigue.  Cough and fever. EXAM: CHEST  2 VIEW COMPARISON:  02/09/2017 FINDINGS: Right chest wall port a catheter is noted with tip at the level of the cavoatrial junction. Heart size is normal. No pleural effusion or edema. No airspace opacities identified. IMPRESSION: 1. No active cardiopulmonary abnormality. Electronically Signed   By: Kerby Moors M.D.   On: 02/16/2017 19:29   US Liver Doppler  Result Date: 02/17/2017 CLINICAL DATA:  Right upper abdominal pain, transaminitis EXAM: DUPLEX ULTRASOUND OF LIVER TECHNIQUE: Color and duplex  Doppler ultrasound was performed to evaluate the hepatic in-flow and out-flow vessels. COMPARISON:  01/15/2017 and previous FINDINGS: Portal Vein 1.2 cm diameter. No evidence of occlusion or thrombus. Velocities (all hepatopetal): Main:  24-29 cm/sec Right:  18 cm/sec Left:  16 cm/sec Hepatic Vein Velocities (all hepatofugal): Right:  29 cm/sec Middle:  57 cm/sec Left:  47 cm/sec Intrahepatic IVC patent velocities up to 156 cm/second. Hepatic Artery Velocity:  163 cm/sec, RI = 0.6 Spleen 7.7 x 4.4 x 9.4 cm (volume = 170 cm^3). Complex cystic process with shadowing components are around the periphery of the splenic capsule as has been demonstrated on imaging dating back to  10/23/2008. No evidence of splenic vein occlusion or thrombus; velocity: 23 cm/sec Varices: None seen Ascites: None seen IMPRESSION: 1. Unremarkable hepatic vascular Doppler ultrasound evaluation. 2. Chronic complex cystic perisplenic process as has been described previously. Electronically Signed   By: Lucrezia Europe M.D.   On: 02/17/2017 12:38   US Abdomen Limited Ruq  Result Date: 02/17/2017 CLINICAL DATA:  Transaminitis. EXAM: ULTRASOUND ABDOMEN LIMITED RIGHT UPPER QUADRANT COMPARISON:  Abdominal ultrasound of February 16, 2016 FINDINGS: The study is somewhat limited due to the patient's body habitus and limited acoustical windows. Gallbladder: No gallstones or wall thickening visualized. No sonographic Murphy sign noted by sonographer. Common bile duct: Diameter: 2.9 mm Liver: The hepatic echotexture is normal. In the posterior aspect of the right lobe there is a simple appearing cyst measuring 1.4 x 1.0 x 1.1 cm. There is no intrahepatic ductal dilation. Portal vein is patent on color Doppler imaging with normal direction of blood flow towards the liver. IMPRESSION: Normal appearance of the gallbladder and common bile duct. Simple appearing right lobe hepatic cyst. The liver is otherwise unremarkable where visualized. Electronically Signed   By: David  Martinique M.D.   On: 02/17/2017 11:36     Assessment/Plan:  Chronic hep B flare =  Her transaminitis are trending upward slightly today.  - still continue on tenofovir, which we will continue as an outpatient instead of entecavir  Pneumonia =  I would discontinue vancomycin and keep piptazo for now. I suspect her previous findings are likely from her recent pneumonia. Would recommend incentive spirometer to see that helps her symptoms  Fevers= trending down. could still be due to hep B flare. Continue to monitor   [facility] Cell: [phone] Pager: [phone]  02/18/2017, 3:20 PM

## 2017-02-18 NOTE — Progress Notes (Signed)
Initial Nutrition Assessment  DOCUMENTATION CODES:   Not applicable  INTERVENTION:  - Continue Ensure Enlive BID, each supplement provides 350 kcal and 20 grams of protein - Continue to encourage PO intakes. - RD will continue to monitor for needs; will talk with pt's daughter when able.   NUTRITION DIAGNOSIS:   Increased nutrient needs related to catabolic illness, cancer and cancer related treatments as evidenced by estimated needs.  GOAL:   Patient will meet greater than or equal to 90% of their needs  MONITOR:   PO intake, Supplement acceptance, Weight trends, Labs  REASON FOR ASSESSMENT:   Malnutrition Screening Tool, Consult Assessment of nutrition requirement/status  ASSESSMENT:   47 y.o. 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  She had a relapse in January 2018 and she has been receiving chemotherapy at the [facility].  She also has chronic hepatitis B for which she was previously taking entecavir but which stopped over the summer of 2018 and she only restarted this medication about two weeks prior to admission.  Her last Hep B viral load was in December and was 82,700,000 (HIGH).  She presents with a 3-week history of anxorexia, fatigue, nausea, and RUQ pressure.  She has also been having fevers and chills.  A week prior to admission she was diagnosed with CAP.  BMI indicates normal weight. Per review of doc flowsheet and Health Touch, pt consumed 50% of a bowl of tomato soup for breakfast (~70 kcal and 1.5 grams of protein) and 100% of lunch (which was part of a [employer] sandwich and items brought in for pt). Spoke with RN who reports that pt likes for her daughter [name]) to translate/interpret. Pt prefers not use the iPad for video conversation with interpreter. Pt is able to speak a little bit of English.  Goi's phone number is written on the white board in the room [phone]) but pt reports that [name]  is currently in class at [facility] and will not be finished until 5 PM. Unfortunately, RD will not be present at that time. Pt is able to indicate that she felt okay with eating today and that she is not experiencing any abdominal pain or nausea at this time. Noted information italicized above, which is from H&P. Notes also state pt has thrush.   No muscle or fat wasting noted during physical assessment. Per chart review, weight has been fairly stable, slight increase in weight, since July 2018. Will continue to monitor weight trends closely. Ensure Enlive ordered BID yesterday; pt accepted one bottle yesterday and one bottle earlier today.  Medications reviewed; 100 mg Colace BID, 5 mL Mycostatin QID, 40 mg oral Protonix/day, 1 packet Miralax BID, 2 tablets Senokot/day. Labs reviewed; Ca: 7.9 mg/dL, Alk Phos elevated, LFTs elevated and trending up.  IVF: NS @ 100 mL/hr.      NUTRITION - FOCUSED PHYSICAL EXAM:  Completed/assessed with no fat or muscle wasting noted.   Diet Order:  Diet regular Room service appropriate? Yes; Fluid consistency: Thin  EDUCATION NEEDS:   No education needs have been identified at this time  Skin:  Skin Assessment: Reviewed RN Assessment  Last BM:  PTA/unknown  Height:   Ht Readings from Last 1 Encounters:  02/16/17 _0  (1.6 m)    Weight:   Wt Readings from Last 1 Encounters:  02/16/17 133 lb 6.1 oz (60.5 kg)    Ideal Body Weight:  52.27 kg  BMI:  Body mass index is 23.63 kg/m.  Estimated Nutritional Needs:   Kcal:  1815-2060 (30-34 kcal/kg)  Protein:  85-97 grams (1.4-1.6 grams/kg)  Fluid:  >/= 2 L/day      Jarome Matin, MS, RD, LDN, [facility] Inpatient Clinical Dietitian Pager # [phone] After hours/weekend pager # [phone]

## 2017-02-18 NOTE — Evaluation (Signed)
Physical Therapy Evaluation-1x Patient Details Name: Pattijo Juste MRN: [id number] DOB: [birth year] Today's Date: 02/18/2017   History of Present Illness  48 yo female admitted with SIRS, transaminitis. Hx of leukemia, Hep B, chronic bil leg numbness  Clinical Impression  On eval, pt was Mod Ind with mobility. She walked ~115 without LOB or difficulty. Slow, leisurely gait speed. No acute PT needs at this time. Will sign off. 1x eval.     Follow Up Recommendations No PT follow up    Equipment Recommendations  None recommended by PT    Recommendations for Other Services       Precautions / Restrictions Precautions Precautions: None Restrictions Weight Bearing Restrictions: No      Mobility  Bed Mobility Overal bed mobility: Independent                Transfers Overall transfer level: Independent                  Ambulation/Gait Ambulation/Gait assistance: Modified independent (Device/Increase time) Ambulation Distance (Feet): 115 Feet Assistive device: None Gait Pattern/deviations: WFL(Within Functional Limits)     General Gait Details: slow gait speed.   Stairs            Wheelchair Mobility    Modified Rankin (Stroke Patients Only)       Balance Overall balance assessment: No apparent balance deficits (not formally assessed)                                           Pertinent Vitals/Pain Pain Assessment: No/denies pain    Home Living Family/patient expects to be discharged to:: Private residence Living Arrangements: Children Available Help at Discharge: Family           Home Equipment: None      Prior Function Level of Independence: Independent               Hand Dominance        Extremity/Trunk Assessment   Upper Extremity Assessment Upper Extremity Assessment: Overall WFL for tasks assessed    Lower Extremity Assessment Lower Extremity Assessment: Overall WFL for tasks assessed    Cervical  / Trunk Assessment Cervical / Trunk Assessment: Normal  Communication   Communication: Prefers language other than English(she speaks a small amount of English)  Cognition Arousal/Alertness: Awake/alert Behavior During Therapy: WFL for tasks assessed/performed Overall Cognitive Status: Within Functional Limits for tasks assessed                                        General Comments      Exercises     Assessment/Plan    PT Assessment Patent does not need any further PT services  PT Problem List         PT Treatment Interventions      PT Goals (Current goals can be found in the Care Plan section)  Acute Rehab PT Goals Patient Stated Goal: none stated    Frequency     Barriers to discharge        Co-evaluation               AM-PAC PT "6 Clicks" Daily Activity  Outcome Measure Difficulty turning over in bed (including adjusting bedclothes, sheets and blankets)?: None Difficulty moving from lying on back  to sitting on the side of the bed? : None Difficulty sitting down on and standing up from a chair with arms (e.g., wheelchair, bedside commode, etc,.)?: None Help needed moving to and from a bed to chair (including a wheelchair)?: None Help needed walking in hospital room?: None Help needed climbing 3-5 steps with a railing? : None 6 Click Score: 24    End of Session   Activity Tolerance: Patient tolerated treatment well Patient left: in bed;with family/visitor present;with call bell/phone within reach        Time: 9449-6759 PT Time Calculation (min) (ACUTE ONLY): 8 min   Charges:   PT Evaluation $PT Eval Low Complexity: 1 Low     PT G Codes:          Weston Anna, MPT Pager: [phone]

## 2017-02-18 NOTE — Plan of Care (Signed)
  Safety: Ability to remain free from injury will improve 02/18/2017 2306 - Progressing by Keoshia Steinmetz, Sherryll Burger, RN

## 2017-02-19 LAB — PROTIME-INR
INR: 1.34
Prothrombin Time: 16.5 seconds — ABNORMAL HIGH (ref 11.4–15.2)

## 2017-02-19 LAB — CBC
HEMATOCRIT: 28.3 % — AB (ref 36.0–46.0)
Hemoglobin: 9.5 g/dL — ABNORMAL LOW (ref 12.0–15.0)
MCH: 26.3 pg (ref 26.0–34.0)
MCHC: 33.6 g/dL (ref 30.0–36.0)
MCV: 78.4 fL (ref 78.0–100.0)
Platelets: 81 10*3/uL — ABNORMAL LOW (ref 150–400)
RBC: 3.61 MIL/uL — ABNORMAL LOW (ref 3.87–5.11)
RDW: 15.9 % — AB (ref 11.5–15.5)
WBC: 3.3 10*3/uL — ABNORMAL LOW (ref 4.0–10.5)

## 2017-02-19 LAB — COMPREHENSIVE METABOLIC PANEL
ALBUMIN: 2.4 g/dL — AB (ref 3.5–5.0)
ALT: 672 U/L — ABNORMAL HIGH (ref 14–54)
AST: 3110 U/L — AB (ref 15–41)
Alkaline Phosphatase: 188 U/L — ABNORMAL HIGH (ref 38–126)
Anion gap: 5 (ref 5–15)
BILIRUBIN TOTAL: 4.1 mg/dL — AB (ref 0.3–1.2)
BUN: 7 mg/dL (ref 6–20)
CHLORIDE: 106 mmol/L (ref 101–111)
CO2: 25 mmol/L (ref 22–32)
Calcium: 7.6 mg/dL — ABNORMAL LOW (ref 8.9–10.3)
Creatinine, Ser: 0.49 mg/dL (ref 0.44–1.00)
GFR calc Af Amer: >60 mL/min (ref >60)
GFR calc non Af Amer: >60 mL/min (ref >60)
GLUCOSE: 107 mg/dL — AB (ref 65–99)
POTASSIUM: 3.2 mmol/L — AB (ref 3.5–5.1)
Sodium: 136 mmol/L (ref 135–145)
Total Protein: 4.8 g/dL — ABNORMAL LOW (ref 6.5–8.1)

## 2017-02-19 LAB — BILIRUBIN, FRACTIONATED(TOT/DIR/INDIR)
Bilirubin, Direct: 3.1 mg/dL — ABNORMAL HIGH (ref 0.1–0.5)
Indirect Bilirubin: 1.5 mg/dL — ABNORMAL HIGH (ref 0.3–0.9)
Total Bilirubin: 4.6 mg/dL — ABNORMAL HIGH (ref 0.3–1.2)

## 2017-02-19 LAB — MAGNESIUM: MAGNESIUM: 1.6 mg/dL — AB (ref 1.7–2.4)

## 2017-02-19 MED ORDER — POTASSIUM CHLORIDE CRYS ER 20 MEQ PO TBCR
40.0000 meq | EXTENDED_RELEASE_TABLET | ORAL | Status: AC
  Administered 2017-02-19 (×2): 40 meq via ORAL
  Filled 2017-02-19 (×2): qty 2

## 2017-02-19 NOTE — Progress Notes (Signed)
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 She had Lowanda Cashaw relapse in January 2018 and she has been receiving chemotherapy at the [facility].  She also has chronic hepatitis B for which she was previously taking entecavir but which stopped over the summer of 2018 and she only restarted this medication about two weeks prior to admission. Her last Hep B viral load was in December and was 82,700,000 (HIGH). She presents with [name] 3-week history of anxorexia, fatigue, nausea, and RUQ pressure.  She has also been having fevers and chills.  [name] week prior to admission she was diagnosed with CAP and given [name] prescription for levofloxacin which helped her cough, but she continued to have fevers.  She was seen in the [facility] and directly admitted when she was found to be dehydrated and with severely elevated LFTs.  Upon admission, she developed Kao Conry fever to 102F and was started on broad spectrum antibiotics for possible sepsis.  She was hypotensive overnight and required several boluses of NS to stabilize her blood pressures.    Assessment & Plan:   Principal Problem:   Abnormal LFTs Active Problems:   Hepatitis B   Dehydration   Fever   Transaminitis   Chills with fever   SIRS (systemic inflammatory response syndrome) (HCC)   Thrush  SIRS,possible sepsis (sinus tachycardia, high fever, hypotension, mild lactic acidosis) Patient at risk for bacteria infection due to her leukemia and chemotherapy with port present. This may also be due to her acute hepatitis or progression of her malignancy.   - Blood cultures, including from her port, pending, currently NGTDx3 days - UA negative, urine cx with <10,000 colonies (insignificant growth) - CXR:  No evidence of pneumonia - Flu PCR negative -  Vanc d/c'd.  Will d/c zosyn today.  - Ibuprofen prn fever  Transaminitis without liver failure, likely due to acute exacerbation of hepatitis B plus recent fluconazole administration. Patient reports taking entecavir x 2 weeks.  -  Bili worsening today.  AST and ALT slightly improved.  Alk phos stable.  INR 1.3.  Will fractionate bili.  CTM for now.   - Hepatitis B Quant PCR - 1,430,000 - Acute hepatitis panel:  Positive for Hep B, but no acute hep [name] or C superimposed - Tylenol level: negative - ID assistance appreciated - Tenofovir ordered per ID -> pt will continue this as outpatient instead of entecavir per ID - RUQ [place] with doppler:  Benign appearing cyst, but otherwise, no explanation for abdominal pain and transaminitis  Dehydrationwithelevated creatinine at 1.35prior to admission, but received IVF at cancer center and on repeat at time of admission. - improved, continue to monitor  Anorexia due to hepatitis and cancer - Nutrition consult - Ensure - Advance to regular diet when able  Thrush - Nystatin  Plasma cell leukemia, chemotherapy on hold.   - Dr. Burr Medico added to rounding list  Pancytopenia.  She has had lability in her WBC and hemoglobin before to this degree but she has never been thrombocytopenic before.  May be related to chemo and sepsis.     - Repeat CBC in AM - hold lovenox given low platelets  Constipation -  Bisacodyl suppository -  Scheduled miralax and senna  Hypokalemia, oral potassium repletion Follow magnesium  DVT prophylaxis: SCD Code  Status: full  Family Communication: parents at bedside Disposition Plan: pending improvement   Consultants:   ID  Oncology  Procedures:   RUQ [place] with liver doppler  Antimicrobials: Anti-infectives (From admission, onward)   Start     Dose/Rate Route Frequency Ordered Stop   02/17/17 1800  vancomycin (VANCOCIN) 1,250 mg in sodium chloride 0.9 % 250 mL IVPB  Status:  Discontinued       1,250 mg 166.7 mL/hr over 90 Minutes Intravenous Every 24 hours 02/16/17 1903 02/18/17 1726   02/16/17 2300  piperacillin-tazobactam (ZOSYN) IVPB 3.375 g  Status:  Discontinued     3.375 g 12.5 mL/hr over 240 Minutes Intravenous Every 8 hours 02/16/17 1903 02/19/17 1602   02/16/17 2200  acyclovir (ZOVIRAX) tablet 800 mg     800 mg Oral 2 times daily 02/16/17 1644     02/16/17 1800  vancomycin (VANCOCIN) IVPB 1000 mg/200 mL premix  Status:  Discontinued     1,000 mg 200 mL/hr over 60 Minutes Intravenous  Once 02/16/17 1646 02/16/17 1651   02/16/17 1700  tenofovir (VIREAD) tablet 300 mg     300 mg Oral Daily 02/16/17 1545     02/16/17 1700  piperacillin-tazobactam (ZOSYN) IVPB 3.375 g     3.375 g 100 mL/hr over 30 Minutes Intravenous  Once 02/16/17 1646 02/16/17 1739   02/16/17 1700  vancomycin (VANCOCIN) 1,250 mg in sodium chloride 0.9 % 250 mL IVPB     1,250 mg 166.7 mL/hr over 90 Minutes Intravenous  Once 02/16/17 1654 02/16/17 2238     Subjective: Feels better. No complaints. Interpreter used.   Objective: Vitals:   02/18/17 1318 02/18/17 2315 02/19/17 0551 02/19/17 1414  BP: (!) 96/55 (!) 101/58 97/63 100/63  Pulse: 91 87 65 72  Resp: _0 Temp: 100.1 F (37.8 C) 100 F (37.8 C) 97.7 F (36.5 C) (!) 97.5 F (36.4 C)  TempSrc: Oral Oral Oral Oral  SpO2: 97% 98% 100% 100%  Weight:      Height:        Intake/Output Summary (Last 24 hours) at 02/19/2017 1722 Last data filed at 02/19/2017 0600 Gross per 24 hour  Intake 2190 ml  Output -  Net 2190 ml   Filed Weights   02/16/17 1520  Weight: 60.5 kg (133 lb 6.1 oz)    Examination:  General: No acute distress.  Slightly icteric sclera. Cardiovascular: Heart sounds show Sanika Brosious regular rate, and rhythm. No gallops or rubs. No murmurs. No JVD. Lungs: Clear to auscultation bilaterally with good air movement. No rales, rhonchi or wheezes. Abdomen: Soft, nontender, nondistended with normal active bowel sounds. No  masses. No hepatosplenomegaly. Neurological: Alert and oriented 3. Moves all extremities 4 with equal strength. Cranial nerves II through XII grossly intact. Skin: Warm and dry. No rashes or lesions. Extremities: No clubbing or cyanosis. No edema.  Psychiatric: Mood and affect are normal. Insight and judgment are appropriate.  Data Reviewed: I have personally reviewed following labs and imaging studies  CBC: Recent Labs  Lab 02/16/17 1134 02/17/17 0400 02/18/17 0603 02/19/17 0428  WBC 6.4 3.3* 3.3* 3.3*  NEUTROABS 4.8  --   --   --   HGB 12.1 9.2* 9.0* 9.5*  HCT 36.7 27.3* 26.6* 28.3*  MCV 80.0 77.6* 77.6* 78.4  PLT 149 77* 78* 81*   Basic Metabolic Panel: Recent Labs  Lab 02/16/17 1134 02/16/17 1609 02/17/17 0400 02/18/17 0603 02/19/17 0428  NA 135* 132* 138 135 136  K 4.0 3.8 3.2* 3.7 3.2*  CL 101 101 108 102 106  CO2 20* _0 GLUCOSE 101 96 83 75 107*  BUN 36* 28* _1 CREATININE 1.35* 0.79 0.53 0.59 0.49  CALCIUM 10.6* 9.1 7.8* 7.9* 7.6*   GFR: Estimated Creatinine Clearance: 71.1 mL/min (by C-G formula based on SCr of 0.49 mg/dL). Liver Function Tests: Recent Labs  Lab 02/16/17 1134 02/16/17 1609 02/17/17 0400 02/18/17 0603 02/19/17 0428  AST 4,113* 3,274* 1,955* 3,477* 3,110*  ALT 949* 743* 596* 717* 672*  ALKPHOS 287* 240* 192* 180* 188*  BILITOT 2.1* 1.8* 1.8* 3.1* 4.1*  PROT 6.5 5.7* 4.6* 4.9* 4.8*  ALBUMIN 3.2* 2.9* 2.4* 2.5* 2.4*   Recent Labs  Lab 02/16/17 1609  LIPASE 66*   No results for input(s): AMMONIA in the last 168 hours. Coagulation Profile: Recent Labs  Lab 02/16/17 1609 02/17/17 0400 02/18/17 0603 02/19/17 0428  INR 1.34 1.52 1.47 1.34   Cardiac Enzymes: No results for input(s): CKTOTAL, CKMB, CKMBINDEX, TROPONINI in the last 168 hours. BNP (last 3 results) No results for input(s): PROBNP in the last 8760 hours. HbA1C: No results for input(s): HGBA1C in the last 72 hours. CBG: No results for input(s):  GLUCAP in the last 168 hours. Lipid Profile: No results for input(s): CHOL, HDL, LDLCALC, TRIG, CHOLHDL, LDLDIRECT in the last 72 hours. Thyroid Function Tests: No results for input(s): TSH, T4TOTAL, FREET4, T3FREE, THYROIDAB in the last 72 hours. Anemia Panel: No results for input(s): VITAMINB12, FOLATE, FERRITIN, TIBC, IRON, RETICCTPCT in the last 72 hours. Sepsis Labs: Recent Labs  Lab 02/16/17 1609 02/16/17 1744 02/16/17 2021  PROCALCITON 2.01  --   --   LATICACIDVEN  --  2.2* 1.7    Recent Results (from the past 240 hour(s))  Culture, blood (Routine X 2) w Reflex to ID Panel     Status: None (Preliminary result)   Collection Time: 02/16/17  4:51 PM  Result Value Ref Range Status   Specimen Description BLOOD RIGHT HAND  Final   Special Requests IN PEDIATRIC BOTTLE Blood Culture adequate volume  Final   Culture   Final    NO GROWTH 3 DAYS Performed at [facility], [place] [address] [place], [place] [address]    Report Status PENDING  Incomplete  Culture, blood (Routine X 2) w Reflex to ID Panel     Status: None (Preliminary result)   Collection Time: 02/16/17  4:58 PM  Result Value Ref Range Status   Specimen Description BLOOD RIGHT HAND  Final   Special Requests IN PEDIATRIC BOTTLE Blood Culture adequate volume  Final   Culture   Final    NO GROWTH 3 DAYS Performed at [facility], [place] [address], [place], [place] [address]    Report Status PENDING  Incomplete  Culture, Urine     Status: Abnormal   Collection Time: 02/16/17  5:42 PM  Result Value Ref Range Status   Specimen Description URINE, CLEAN CATCH  Final   Special Requests NONE  Final   Culture (Loistine Eberlin)  Final    <10,000 COLONIES/mL INSIGNIFICANT GROWTH Performed at [facility], [place] [address], [place],  [address]    Report Status 02/18/2017 FINAL  Final  Culture, blood (single) w Reflex to ID Panel     Status: None (Preliminary result)   Collection Time: 02/16/17  5:44 PM  Result Value  Ref Range Status   Specimen Description BLOOD PORTA  CATH  Final   Special Requests   Final    BOTTLES DRAWN AEROBIC AND ANAEROBIC Blood Culture adequate volume   Culture   Final    NO GROWTH 3 DAYS Performed at [facility], [address] [place], [place] [address]    Report Status PENDING  Incomplete  MRSA PCR Screening     Status: None   Collection Time: 02/17/17  7:32 AM  Result Value Ref Range Status   MRSA by PCR NEGATIVE NEGATIVE Final    Comment:        The GeneXpert MRSA Assay (FDA approved for NASAL specimens only), is one component of Deldrick Linch comprehensive MRSA colonization surveillance program. It is not intended to diagnose MRSA infection nor to guide or monitor treatment for MRSA infections.          Radiology Studies: No results found.      Scheduled Meds: . acyclovir  800 mg Oral BID  . bisacodyl  10 mg Rectal Once  . docusate sodium  100 mg Oral BID  . feeding supplement (ENSURE ENLIVE)  237 mL Oral BID BM  . nystatin  5 mL Oral QID  . pantoprazole  40 mg Oral Daily  . polyethylene glycol  17 g Oral BID  . senna  2 tablet Oral QHS  . tenofovir  300 mg Oral Daily   Continuous Infusions: . sodium chloride 100 mL/hr at 02/19/17 0533     LOS: 3 days    Time spent: over 20 min    Fayrene Helper, MD [facility] Pager [phone]  If 7PM-7AM, please contact night-coverage [email or web address] Password [email or web address] 02/19/2017, 5:22 PM

## 2017-02-19 NOTE — Progress Notes (Signed)
When administering 2200 meds RN noticed home prescription of hydrocodone-chlorphen cough syrup at bedside. RN asked Pt is she could lock in up in the pharmacy until Pt was discharged. Pt agreed. Prescription send to pharmacy.

## 2017-02-19 NOTE — Progress Notes (Signed)
Pharmacy Antibiotic Note  Sheryl [name] is a 47 y.o. female admitted on 02/16/2017 with sepsis.  PMH significant for plasma cell leukemia, s/p BMT, hepatitis B.  Recently developed possible pneumonia and was treated with levofloxacin.  At [facility] today patient was found to be tachycardic, dehydrated and abnormal liver function enzymes.  Sent to [facility] for admission.  Pharmacy has been consulted for Vancomycin and Zosyn dosing.  ID consulted.  Continued on zosyn alone for now.  On HBV treatment.  Today, 02/19/2017 Day #3 abx - renal: WNL   Plan:  Zosyn 3.375gm IV q8h (each dose infused over 4 hrs) remains appropriately dose  Pharmacy to sign off as no dose adjustment needed   Height: 5\' 3"  (160 cm) Weight: 133 lb 6.1 oz (60.5 kg) IBW/kg (Calculated) : 52.4  Temp (24hrs), Avg:99.3 F (37.4 C), Min:97.7 F (36.5 C), Max:100.1 F (37.8 C)  Recent Labs  Lab 02/16/17 1134 02/16/17 1609 02/16/17 1744 02/16/17 2021 02/17/17 0400 02/18/17 0603 02/19/17 0428  WBC 6.4  --   --   --  3.3* 3.3* 3.3*  CREATININE 1.35* 0.79  --   --  0.53 0.59 0.49  LATICACIDVEN  --   --  2.2* 1.7  --   --   --     Estimated Creatinine Clearance: 71.1 mL/min (by C-G formula based on SCr of 0.49 mg/dL).    No Known Allergies  Antimicrobials this admission:  1/14 vanc >>  1/16 1/14 zosyn >>   Acyclovir continued from PTA med list  Dose adjustments this admission:   Microbiology results:  1/14 BCx x3: ngtd 1/14 UCx: <10k insignif growth 1/15 MRSA PCR: negative 1/14 Influenza A: negative 1/14 Influenza B: negative 1/14 Hep B S Ag and Core IGM: positive  Thank you for allowing pharmacy to be a part of this patient's care.  Doreene Eland, PharmD, BCPS.   Pager: [phone] 02/19/2017 10:36 AM

## 2017-02-20 ENCOUNTER — Other Ambulatory Visit: Payer: Self-pay | Admitting: Internal Medicine

## 2017-02-20 LAB — CBC
HCT: 27.5 % — ABNORMAL LOW (ref 36.0–46.0)
Hemoglobin: 9.2 g/dL — ABNORMAL LOW (ref 12.0–15.0)
MCH: 26.2 pg (ref 26.0–34.0)
MCHC: 33.5 g/dL (ref 30.0–36.0)
MCV: 78.3 fL (ref 78.0–100.0)
PLATELETS: 72 10*3/uL — AB (ref 150–400)
RBC: 3.51 MIL/uL — ABNORMAL LOW (ref 3.87–5.11)
RDW: 16.2 % — ABNORMAL HIGH (ref 11.5–15.5)
WBC: 3.4 10*3/uL — ABNORMAL LOW (ref 4.0–10.5)

## 2017-02-20 LAB — HEPATIC FUNCTION PANEL
ALK PHOS: 172 U/L — AB (ref 38–126)
ALT: 688 U/L — ABNORMAL HIGH (ref 14–54)
AST: 2809 U/L — AB (ref 15–41)
Albumin: 2.4 g/dL — ABNORMAL LOW (ref 3.5–5.0)
BILIRUBIN DIRECT: 3.1 mg/dL — AB (ref 0.1–0.5)
BILIRUBIN TOTAL: 4.7 mg/dL — AB (ref 0.3–1.2)
Indirect Bilirubin: 1.6 mg/dL — ABNORMAL HIGH (ref 0.3–0.9)
Total Protein: 4.7 g/dL — ABNORMAL LOW (ref 6.5–8.1)

## 2017-02-20 LAB — BASIC METABOLIC PANEL
ANION GAP: 6 (ref 5–15)
BUN: 5 mg/dL — ABNORMAL LOW (ref 6–20)
CALCIUM: 7.5 mg/dL — AB (ref 8.9–10.3)
CHLORIDE: 112 mmol/L — AB (ref 101–111)
CO2: 20 mmol/L — ABNORMAL LOW (ref 22–32)
CREATININE: 0.32 mg/dL — AB (ref 0.44–1.00)
GFR calc Af Amer: >60 mL/min (ref >60)
GFR calc non Af Amer: >60 mL/min (ref >60)
GLUCOSE: 75 mg/dL (ref 65–99)
Potassium: 4.1 mmol/L (ref 3.5–5.1)
Sodium: 138 mmol/L (ref 135–145)

## 2017-02-20 LAB — MAGNESIUM: Magnesium: 1.5 mg/dL — ABNORMAL LOW (ref 1.7–2.4)

## 2017-02-20 LAB — PROTIME-INR
INR: 1.29
PROTHROMBIN TIME: 16 s — AB (ref 11.4–15.2)

## 2017-02-20 MED ORDER — TENOFOVIR DISOPROXIL FUMARATE 300 MG PO TABS: 300.0000 mg | ORAL_TABLET | Freq: Every day | ORAL | 6 refills | Status: DC

## 2017-02-20 NOTE — Progress Notes (Signed)
[facility] for Infectious Disease    Date of Admission:  02/16/2017   Total days of antibiotics 5        Day 5 tdf        Day 5 acyclovir           ID: Sheryl [name] is a 46 y.o. female with hep b flare p/w acute transaminitis and fever Principal Problem:   Abnormal LFTs Active Problems:   Hepatitis B   Dehydration   Fever   Transaminitis   Chills with fever   SIRS (systemic inflammatory response syndrome) (HCC)   Thrush    Subjective: Feeling better, afebrile, less coughing  Medications:  . acyclovir  800 mg Oral BID  . bisacodyl  10 mg Rectal Once  . docusate sodium  100 mg Oral BID  . feeding supplement (ENSURE ENLIVE)  237 mL Oral BID BM  . pantoprazole  40 mg Oral Daily  . polyethylene glycol  17 g Oral BID  . senna  2 tablet Oral QHS  . tenofovir  300 mg Oral Daily    Objective: Vital signs in last 24 hours: Temp:  [97.4 F (36.3 C)-98.1 F (36.7 C)] 97.4 F (36.3 C) (01/18 1436) Pulse Rate:  [66-78] 66 (01/18 1436) Resp:  [14-15] 14 (01/18 1436) BP: (109-124)/(62-81) 112/74 (01/18 1436) SpO2:  [99 %-100 %] 100 % (01/18 1436)  Physical Exam  Constitutional:  oriented to person, place, and time. appears well-developed and well-nourished. No distress.  HENT: [place]/AT, PERRLA, no scleral icterus Mouth/Throat: Oropharynx is clear and moist. No oropharyngeal exudate.  Cardiovascular: Normal rate, regular rhythm and normal heart sounds. Exam reveals no gallop and no friction rub.  No murmur heard.  Pulmonary/Chest: Effort normal and breath sounds normal. No respiratory distress.  has no wheezes.  Neck = supple, no nuchal rigidity Abdominal: Soft. Bowel sounds are normal.  exhibits no distension. There is no tenderness.  Lymphadenopathy: no cervical adenopathy. No axillary adenopathy Neurological: alert and oriented to person, place, and time.  Skin: Skin is warm and dry. No rash noted. No erythema.  Psychiatric: a normal mood and affect.  behavior is normal.     Lab Results Recent Labs    02/19/17 0428 02/20/17 0342  WBC 3.3* 3.4*  HGB 9.5* 9.2*  HCT 28.3* 27.5*  NA 136 138  K 3.2* 4.1  CL 106 112*  CO2 25 20*  BUN 7 5*  CREATININE 0.49 0.32*   Liver Panel Recent Labs    02/19/17 0428 02/19/17 1841 02/20/17 0342  PROT 4.8*  --  4.7*  ALBUMIN 2.4*  --  2.4*  AST 3,110*  --  2,809*  ALT 672*  --  688*  ALKPHOS 188*  --  172*  BILITOT 4.1* 4.6* 4.7*  BILIDIR  --  3.1* 3.1*  IBILI  --  1.5* 1.6*    Microbiology: reviewed Studies/Results: No results found.   Assessment/Plan: Chronic hepatitis b with hep b flare = we have switched her to tenofovir during this hospitalization. She appears to tolerate it. transaminitis is slowly improving. Recommend to continue as outpatient. I have sent the prescription to outpatient pharmacy with daughter going to pick up medication. ( originally to [facility] but they are out of stock thus it is being forwarded to [facility] on [address])  coags also improving  For discharge make sure that they stop entecavir and take tenofovir instead. She would need twice a week hepatic panel to ensure it is improving  [facility] Cell: [phone] Pager: [phone]  02/20/2017, 5:38 PM

## 2017-02-20 NOTE — Progress Notes (Signed)
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 She had Beuna Bolding relapse in January 2018 and she has been receiving chemotherapy at the [facility].  She also has chronic hepatitis B for which she was previously taking entecavir but which stopped over the summer of 2018 and she only restarted this medication about two weeks prior to admission. Her last Hep B viral load was in December and was 82,700,000 (HIGH). She presents with Sheryl [name] 3-week history of anxorexia, fatigue, nausea, and RUQ pressure.  She has also been having fevers and chills.  Sheryl [name] week prior to admission she was diagnosed with CAP and given Etheleen Valtierra prescription for levofloxacin which helped her cough, but she continued to have fevers.  She was seen in the [facility] and directly admitted when she was found to be dehydrated and with severely elevated LFTs.  Upon admission, she developed Kensi Karr fever to 102F and was started on broad spectrum antibiotics for possible sepsis.  She was hypotensive overnight and required several boluses of NS to stabilize her blood pressures.    Assessment & Plan:   Principal Problem:   Abnormal LFTs Active Problems:   Hepatitis B   Dehydration   Fever   Transaminitis   Chills with fever   SIRS (systemic inflammatory response syndrome) (HCC)   Thrush  SIRS,possible sepsis (sinus tachycardia, high fever, hypotension, mild lactic acidosis) Patient at risk for bacteria infection due to her leukemia and chemotherapy with port present. This may also be due to her acute hepatitis or progression of her malignancy.   - Blood cultures, including from her port, pending, currently NGTDx3 days - UA negative, urine cx with <10,000 colonies (insignificant growth) - CXR:  No evidence of pneumonia - Flu PCR negative -  Vanc d/c'd.  Will d/c zosyn.  - Ibuprofen prn fever  Transaminitis, likely due to acute exacerbation of hepatitis B plus recent fluconazole administration. Patient reports taking entecavir x 2 weeks.  -  Bili stable today.  AST slightly improved.  ALT relatively stable. and ALT slightly improved.  Alk phos relatively stable.  INR 1.29.     - Hepatitis B Quant PCR - 1,430,000 - Acute hepatitis panel:  Positive for Hep B, but no acute hep Sheryl [name] or C superimposed - Tylenol level: negative - ID assistance appreciated - Tenofovir ordered per ID -> pt will continue this as outpatient instead of entecavir per ID - RUQ [place] with doppler:  Benign appearing cyst, but otherwise, no explanation for abdominal pain and transaminitis  Dehydrationwithelevated creatinine at 1.35prior to admission, but received IVF at cancer center and on repeat at time of admission. - improved, continue to monitor  Anorexia due to hepatitis and cancer - Nutrition consult - Ensure - Advance to regular diet when able  Thrush - Nystatin  Plasma cell leukemia, chemotherapy on hold.   - Dr. Burr Medico added to rounding list  Pancytopenia.  She has had lability in her WBC and hemoglobin before to this degree but she has never been thrombocytopenic before.  May be related to chemo and sepsis.     - Repeat CBC in AM - hold lovenox given low platelets  Constipation -  Bisacodyl suppository -  Scheduled miralax and senna  Hypokalemia, oral potassium repletion Follow magnesium  DVT prophylaxis: SCD Code Status: full  Family Communication: parents at bedside Disposition Plan: pending improvement   Consultants:   ID  Oncology  Procedures:   RUQ [place] with liver doppler  Antimicrobials: Anti-infectives (From admission, onward)   Start     Dose/Rate Route Frequency Ordered Stop   02/17/17 1800  vancomycin (VANCOCIN) 1,250 mg in sodium chloride 0.9 % 250 mL IVPB  Status:  Discontinued     1,250  mg 166.7 mL/hr over 90 Minutes Intravenous Every 24 hours 02/16/17 1903 02/18/17 1726   02/16/17 2300  piperacillin-tazobactam (ZOSYN) IVPB 3.375 g  Status:  Discontinued     3.375 g 12.5 mL/hr over 240 Minutes Intravenous Every 8 hours 02/16/17 1903 02/19/17 1602   02/16/17 2200  acyclovir (ZOVIRAX) tablet 800 mg     800 mg Oral 2 times daily 02/16/17 1644     02/16/17 1800  vancomycin (VANCOCIN) IVPB 1000 mg/200 mL premix  Status:  Discontinued     1,000 mg 200 mL/hr over 60 Minutes Intravenous  Once 02/16/17 1646 02/16/17 1651   02/16/17 1700  tenofovir (VIREAD) tablet 300 mg     300 mg Oral Daily 02/16/17 1545     02/16/17 1700  piperacillin-tazobactam (ZOSYN) IVPB 3.375 g     3.375 g 100 mL/hr over 30 Minutes Intravenous  Once 02/16/17 1646 02/16/17 1739   02/16/17 1700  vancomycin (VANCOCIN) 1,250 mg in sodium chloride 0.9 % 250 mL IVPB     1,250 mg 166.7 mL/hr over 90 Minutes Intravenous  Once 02/16/17 1654 02/16/17 2238     Subjective: HA, improved with prn med. No other complaints.   Objective: Vitals:   02/19/17 1414 02/19/17 2111 02/20/17 0557 02/20/17 1436  BP: 100/63 124/81 109/62 112/74  Pulse: 72 78 77 66  Resp: _0 Temp: (!) 97.5 F (36.4 C) 97.6 F (36.4 C) 98.1 F (36.7 C) (!) 97.4 F (36.3 C)  TempSrc: Oral Oral Oral Oral  SpO2: 100% 100% 99% 100%  Weight:      Height:        Intake/Output Summary (Last 24 hours) at 02/20/2017 2021 Last data filed at 02/20/2017 1500 Gross per 24 hour  Intake 1906.67 ml  Output -  Net 1906.67 ml   Filed Weights   02/16/17 1520  Weight: 60.5 kg (133 lb 6.1 oz)    Examination: General: No acute distress. Cardiovascular: Heart sounds show Traeh Milroy regular rate, and rhythm. No gallops or rubs. No murmurs. No JVD. Lungs: Clear to auscultation bilaterally with good air movement. No rales, rhonchi or wheezes. Abdomen: Soft, nontender, nondistended with normal active bowel sounds. No masses. No  hepatosplenomegaly. Neurological: Alert and oriented 3. Moves all extremities 4 with equal strength. Cranial nerves II through XII grossly intact. Skin: Warm and dry. No rashes or lesions. Extremities: No clubbing or cyanosis. No edema.  Psychiatric: Mood and affect are normal. Insight and judgment are appropriate.  Data Reviewed: I have personally reviewed following labs and imaging studies  CBC: Recent Labs  Lab 02/16/17 1134 02/17/17 0400 02/18/17 0603 02/19/17 0428 02/20/17 0342  WBC 6.4 3.3* 3.3* 3.3* 3.4*  NEUTROABS 4.8  --   --   --   --   HGB 12.1 9.2* 9.0* 9.5* 9.2*  HCT 36.7 27.3* 26.6* 28.3* 27.5*  MCV 80.0 77.6* 77.6* 78.4 78.3  PLT 149 77* 78* 81* 72*   Basic Metabolic Panel: Recent Labs  Lab 02/16/17 1609 02/17/17 0400 02/18/17 0603 02/19/17 0428 02/19/17 1841 02/20/17 0342  NA 132* 138  135 136  --  138  K 3.8 3.2* 3.7 3.2*  --  4.1  CL 101 108 102 106  --  112*  CO2 _0 --  20*  GLUCOSE 96 83 75 107*  --  75  BUN 28* _1 --  5*  CREATININE 0.79 0.53 0.59 0.49  --  0.32*  CALCIUM 9.1 7.8* 7.9* 7.6*  --  7.5*  MG  --   --   --   --  1.6* 1.5*   GFR: Estimated Creatinine Clearance: 71.1 mL/min (Chamar Broughton) (by C-G formula based on SCr of 0.32 mg/dL (L)). Liver Function Tests: Recent Labs  Lab 02/16/17 1609 02/17/17 0400 02/18/17 0603 02/19/17 0428 02/19/17 1841 02/20/17 0342  AST 3,274* 1,955* 3,477* 3,110*  --  2,809*  ALT 743* 596* 717* 672*  --  688*  ALKPHOS 240* 192* 180* 188*  --  172*  BILITOT 1.8* 1.8* 3.1* 4.1* 4.6* 4.7*  PROT 5.7* 4.6* 4.9* 4.8*  --  4.7*  ALBUMIN 2.9* 2.4* 2.5* 2.4*  --  2.4*   Recent Labs  Lab 02/16/17 1609  LIPASE 66*   No results for input(s): AMMONIA in the last 168 hours. Coagulation Profile: Recent Labs  Lab 02/16/17 1609 02/17/17 0400 02/18/17 0603 02/19/17 0428 02/20/17 0342  INR 1.34 1.52 1.47 1.34 1.29   Cardiac Enzymes: No results for input(s): CKTOTAL, CKMB, CKMBINDEX, TROPONINI in  the last 168 hours. BNP (last 3 results) No results for input(s): PROBNP in the last 8760 hours. HbA1C: No results for input(s): HGBA1C in the last 72 hours. CBG: No results for input(s): GLUCAP in the last 168 hours. Lipid Profile: No results for input(s): CHOL, HDL, LDLCALC, TRIG, CHOLHDL, LDLDIRECT in the last 72 hours. Thyroid Function Tests: No results for input(s): TSH, T4TOTAL, FREET4, T3FREE, THYROIDAB in the last 72 hours. Anemia Panel: No results for input(s): VITAMINB12, FOLATE, FERRITIN, TIBC, IRON, RETICCTPCT in the last 72 hours. Sepsis Labs: Recent Labs  Lab 02/16/17 1609 02/16/17 1744 02/16/17 2021  PROCALCITON 2.01  --   --   LATICACIDVEN  --  2.2* 1.7    Recent Results (from the past 240 hour(s))  Culture, blood (Routine X 2) w Reflex to ID Panel     Status: None (Preliminary result)   Collection Time: 02/16/17  4:51 PM  Result Value Ref Range Status   Specimen Description BLOOD RIGHT HAND  Final   Special Requests IN PEDIATRIC BOTTLE Blood Culture adequate volume  Final   Culture   Final    NO GROWTH 4 DAYS Performed at [facility], [place] [address] [place], [place] [address]    Report Status PENDING  Incomplete  Culture, blood (Routine X 2) w Reflex to ID Panel     Status: None (Preliminary result)   Collection Time: 02/16/17  4:58 PM  Result Value Ref Range Status   Specimen Description BLOOD RIGHT HAND  Final   Special Requests IN PEDIATRIC BOTTLE Blood Culture adequate volume  Final   Culture   Final    NO GROWTH 4 DAYS Performed at [facility], [place] [address] [place], [place] [address]    Report Status PENDING  Incomplete  Culture, Urine     Status: Abnormal   Collection Time: 02/16/17  5:42 PM  Result Value Ref Range Status   Specimen Description URINE, CLEAN CATCH  Final   Special Requests NONE  Final   Culture (Joscelynn Brutus)  Final    <  10,000 COLONIES/mL INSIGNIFICANT GROWTH Performed at [facility], [place] [address],  [place], [place] [address]    Report Status 02/18/2017 FINAL  Final  Culture, blood (single) w Reflex to ID Panel     Status: None (Preliminary result)   Collection Time: 02/16/17  5:44 PM  Result Value Ref Range Status   Specimen Description BLOOD PORTA CATH  Final   Special Requests   Final    BOTTLES DRAWN AEROBIC AND ANAEROBIC Blood Culture adequate volume   Culture   Final    NO GROWTH 4 DAYS Performed at [facility], [place] [address] [place], [place] [address]    Report Status PENDING  Incomplete  MRSA PCR Screening     Status: None   Collection Time: 02/17/17  7:32 AM  Result Value Ref Range Status   MRSA by PCR NEGATIVE NEGATIVE Final    Comment:        The GeneXpert MRSA Assay (FDA approved for NASAL specimens only), is one component of Saidi Santacroce comprehensive MRSA colonization surveillance program. It is not intended to diagnose MRSA infection nor to guide or monitor treatment for MRSA infections.          Radiology Studies: No results found.      Scheduled Meds: . acyclovir  800 mg Oral BID  . bisacodyl  10 mg Rectal Once  . docusate sodium  100 mg Oral BID  . feeding supplement (ENSURE ENLIVE)  237 mL Oral BID BM  . pantoprazole  40 mg Oral Daily  . polyethylene glycol  17 g Oral BID  . senna  2 tablet Oral QHS  . tenofovir  300 mg Oral Daily   Continuous Infusions: . sodium chloride 100 mL/hr at 02/19/17 0533     LOS: 4 days    Time spent: over 93 min    Fayrene Helper, MD [facility] Pager [phone]  If 7PM-7AM, please contact night-coverage [email or web address] Password [email or web address] 02/20/2017, 8:21 PM

## 2017-02-21 LAB — CULTURE, BLOOD (SINGLE)
Culture: NO GROWTH
SPECIAL REQUESTS: ADEQUATE

## 2017-02-21 LAB — CULTURE, BLOOD (ROUTINE X 2)
CULTURE: NO GROWTH
Culture: NO GROWTH
SPECIAL REQUESTS: ADEQUATE
Special Requests: ADEQUATE

## 2017-02-21 LAB — CBC
HCT: 28.6 % — ABNORMAL LOW (ref 36.0–46.0)
Hemoglobin: 9.4 g/dL — ABNORMAL LOW (ref 12.0–15.0)
MCH: 26.1 pg (ref 26.0–34.0)
MCHC: 32.9 g/dL (ref 30.0–36.0)
MCV: 79.4 fL (ref 78.0–100.0)
PLATELETS: 122 10*3/uL — AB (ref 150–400)
RBC: 3.6 MIL/uL — ABNORMAL LOW (ref 3.87–5.11)
RDW: 16.1 % — AB (ref 11.5–15.5)
WBC: 3.7 10*3/uL — ABNORMAL LOW (ref 4.0–10.5)

## 2017-02-21 LAB — BASIC METABOLIC PANEL
Anion gap: 4 — ABNORMAL LOW (ref 5–15)
CHLORIDE: 110 mmol/L (ref 101–111)
CO2: 21 mmol/L — AB (ref 22–32)
CREATININE: 0.31 mg/dL — AB (ref 0.44–1.00)
Calcium: 7.3 mg/dL — ABNORMAL LOW (ref 8.9–10.3)
GFR calc non Af Amer: >60 mL/min (ref >60)
Glucose, Bld: 91 mg/dL (ref 65–99)
Potassium: 3.6 mmol/L (ref 3.5–5.1)
Sodium: 135 mmol/L (ref 135–145)

## 2017-02-21 LAB — HEPATIC FUNCTION PANEL
ALBUMIN: 2.5 g/dL — AB (ref 3.5–5.0)
ALK PHOS: 182 U/L — AB (ref 38–126)
ALT: 645 U/L — ABNORMAL HIGH (ref 14–54)
AST: 2617 U/L — ABNORMAL HIGH (ref 15–41)
BILIRUBIN TOTAL: 5.4 mg/dL — AB (ref 0.3–1.2)
Bilirubin, Direct: 3.7 mg/dL — ABNORMAL HIGH (ref 0.1–0.5)
Indirect Bilirubin: 1.7 mg/dL — ABNORMAL HIGH (ref 0.3–0.9)
Total Protein: 4.7 g/dL — ABNORMAL LOW (ref 6.5–8.1)

## 2017-02-21 LAB — PROTIME-INR
INR: 1.31
Prothrombin Time: 16.2 seconds — ABNORMAL HIGH (ref 11.4–15.2)

## 2017-02-21 LAB — MAGNESIUM: MAGNESIUM: 1.6 mg/dL — AB (ref 1.7–2.4)

## 2017-02-21 MED ORDER — PROMETHAZINE HCL 25 MG/ML IJ SOLN: 6.2500 mg | Freq: Four times a day (QID) | INTRAMUSCULAR | Status: DC | PRN

## 2017-02-21 MED ORDER — MAGNESIUM SULFATE 2 GM/50ML IV SOLN
2.0000 g | Freq: Once | INTRAVENOUS | Status: AC
  Administered 2017-02-21: 2 g via INTRAVENOUS
  Filled 2017-02-21: qty 50

## 2017-02-21 MED ORDER — ENSURE ENLIVE PO LIQD: 237.0000 mL | Freq: Two times a day (BID) | ORAL | 12 refills | Status: DC

## 2017-02-21 MED ORDER — HEPARIN SOD (PORK) LOCK FLUSH 100 UNIT/ML IV SOLN
500.0000 [IU] | INTRAVENOUS | Status: AC | PRN
  Administered 2017-02-21: 500 [IU]

## 2017-02-21 NOTE — Discharge Summary (Signed)
Physician Discharge Summary  Sheryl [name] MRN:[id number] DOB: 12/06/1970 DOA: 02/16/2017  PCP: [name], Sheryl M, MD  Admit date: 02/16/2017 Discharge date: 02/21/2017  Time spent: over 30 minutes  Recommendations for Outpatient Follow-up:  1. Follow up outpatient CBC/BMP/HFP as well as PT/INR twice weekly until clearly improving 2. Ensure continuing tenofovir as outpatient (daughter picked this up from pharmacy prior to discharge) 3. Nystatin d/c'd at discharge, no thrush on my exam and she'd been declining this during the hospitalization, continue to follow  4. Thrombocytopenia improving slightly at discharge, continue to monitor 5. Follow mag as outpatient, may need some chronic PO supplementation  6. Follow up with Sheryl [name] as outpatient 7. 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 She had Sheryl [name] relapse in January 2018and she has [facility]. She also has chronic hepatitis B for which she was previously taking entecavir but which stopped over the summer of 2018 and she only restarted this medication about two weeksprior to admission. Her last Hep B viral load was in December and was 82,700,000 (HIGH).She presents with Sheryl [name] 3-week history of anxorexia, fatigue, nausea, and RUQ pressure.She has also been having fevers and chills.Sheryl [name] week prior to admission she was diagnosed with CAP and given Sheryl [name] prescription for levofloxacin which helped her cough, but she  continued to have fevers.She was seen in the Cancer [facility] and directly admitted when she was found to be dehydrated and with severely elevated LFTs. Upon admission, she developed Sheryl [name] fever to 102F and was started on broad spectrum antibiotics for possible sepsis. She was hypotensive overnight and required several boluses of NS to stabilize her blood pressures.   Broad spectrum abx were eventually discontinued with blood cx NGTD, urine cx insignificant growth.  Sheryl [name] was consulted and she was switched from entecavir to tenofovir.  Her LFT's have fluctuated here, but on discharge her AST/ALT was downtrending.  Her INR was relatively stable and her bili was uptrending slightly.  She was discharged with plans to follow twice weekly liver function tests and continue tenofovir.   Hospital Course:  SIRS,possible sepsis (sinus tachycardia, high fever, hypotension, mild lactic acidosis)Patient at risk for bacteria infection due to her leukemia and chemotherapy with port present. This may also be due to her acute hepatitisor progression of her malignancy. - Blood cultures, no growth to date x5 days - UAnegative,urine cx with <10,000 colonies (insignificant growth) - CXR: No evidence of pneumonia - Flu PCR negative -Vanc and zosyn discontinued - Ibuprofen prn fever  Transaminitis, likely due to acute exacerbation of hepatitis B plus recent fluconazole administration. Patient reports taking entecavir x 2 weeks. -  Bili elevated today.  AST improved.  ALT improved. Alk phos improved.  INR 1.31.     - Hepatitis B Quant PCR - 1,430,000 - Acute hepatitis panel: Positive for Hep B, but no acute hep Sheryl [name] or C superimposed - Tylenol level: negative - IDassistance appreciated - Tenofovir ordered per Sheryl [name] -> pt will   continue this as outpatient instead of entecavir per Sheryl [name] - RUQ [place] with doppler: Simple appearing right lobe hepatic cyst, but otherwise, no explanation for abdominal pain and  transaminitis.   Dehydrationwithelevated creatinine at 1.35prior to admission, but received IVF at cancer center and on repeat at time of admission. - improved, continue to monitor  Anorexia due to hepatitis and cancer - Nutrition consult - Ensure - Advance to regular diet when able  Thrush - Nystatin, d/c'd at discharge, continue to monitor  Plasma cell leukemia, chemotherapy on hold. - Sheryl [name] added to rounding list  Pancytopenia. She has had lability in her WBC and hemoglobin before to this degree but she has never been thrombocytopenic before. May be related to chemo and sepsis.    - Thrombocytopenia improving at discharge  Constipation - Bisacodyl suppository - Scheduled miralax and senna  Hypokalemia  Hypomagnesemia: repleted prn  Procedures: none  Consultations:  Sheryl [name]  Oncology  Discharge Exam: Vitals:   02/21/17 0517 02/21/17 1419  BP: 105/67 93/64  Pulse: 78 78  Resp: 18 18  Temp: 98.5 F (36.9 C) 99.1 F (37.3 C)  SpO2: 100% 100%   Feels tired. No pain  General: No acute distress.  Mild icterus. Cardiovascular: Heart sounds show Byrne Capek regular rate, and rhythm. No gallops or rubs. No murmurs. No JVD. Lungs: Clear to auscultation bilaterally with good air movement. No rales, rhonchi or wheezes. Abdomen: Soft, nontender, nondistended with normal active bowel sounds. No masses. No hepatosplenomegaly. Neurological: Alert and oriented 3. Moves all extremities 4 with equal strength. Cranial nerves II through XII grossly intact. Skin: Warm and dry. No rashes or lesions. Extremities: No clubbing or cyanosis. No edema. Psychiatric: Mood and affect are normal. Insight and judgment are appropriate.   Discharge Instructions   Discharge Instructions    Call MD for:  difficulty breathing, headache or visual disturbances   Complete by:  As directed    Call MD for:  extreme fatigue   Complete by:  As directed    Call MD for:  persistant  dizziness or light-headedness   Complete by:  As directed    Call MD for:  persistant nausea and vomiting   Complete by:  As directed    Call MD for:  severe uncontrolled pain   Complete by:  As directed    Call MD for:  temperature >100.4   Complete by:  As directed    Diet - low sodium heart healthy   Complete by:  As directed    Discharge instructions   Complete by:  As directed    You were seen due to an acute hepatitis B flare.  Your labs have gradually improved with treatment with tenofovir.  Continue to take the tenofovir at discharge.  Please stop the entercavir.  Your liver enzymes are still elevated.  Please call your primary care doctor for an appointment first thing Monday, you need to check your liver labs twice weekly until they improve.  You should get Amanee Iacovelli phone call to follow up with the infectious disease clinic soon, if you don't get Elke Holtry call, please call the number in the discharge paperwork for an appointment.  Follow up with Sheryl [name].  Return with new, recurrent, or worsening symptoms.  Please have your PCP request records from this hospitalization so they know what was done and what next steps to take.   Increase activity slowly   Complete by:  As directed      Allergies as of 02/21/2017  No Known Allergies     Medication List    STOP taking these medications   acetaminophen 325 MG tablet Commonly known as:  TYLENOL   entecavir 0.5 MG tablet Commonly known as:  BARACLUDE     TAKE these medications   acyclovir 800 MG tablet Commonly known as:  ZOVIRAX Take 1 tablet (800 mg total) by mouth 2 (two) times daily.   dexamethasone 4 MG tablet Commonly known as:  DECADRON Take 10 tablets (40 mg total) by mouth once Najmah Carradine week.   feeding supplement (ENSURE ENLIVE) Liqd Take 237 mLs by mouth 2 (two) times daily between meals. Start taking on:  02/22/2017   metFORMIN 500 MG tablet Commonly known as:  GLUCOPHAGE Take 1 tablet (500 mg total) by mouth 2 (two) times daily  with Alvina Strother meal.   omeprazole 20 MG capsule Commonly known as:  PRILOSEC Take 1 capsule (20 mg total) by mouth daily. What changed:  when to take this   ondansetron 8 MG tablet Commonly known as:  ZOFRAN Take 1 tablet (8 mg total) by mouth every 8 (eight) hours as needed for nausea or vomiting.   polyethylene glycol packet Commonly known as:  MIRALAX / GLYCOLAX Take 17 g by mouth daily as needed (constipation).   prochlorperazine 10 MG tablet Commonly known as:  COMPAZINE Take 1 tablet (10 mg total) by mouth every 6 (six) hours as needed for nausea or vomiting.   promethazine 25 MG tablet Commonly known as:  PHENERGAN Take 1 tablet (25 mg total) by mouth every 6 (six) hours as needed for nausea or vomiting.   tenofovir 300 MG tablet Commonly known as:  VIREAD Take 1 tablet (300 mg total) by mouth daily.      No Known Allergies Follow-up Information    [name], MD Follow up.   Specialty:  Family Medicine Contact information: [place] [address] [phone]        Carlyle Basques, MD Follow up.   Specialty:  Infectious Diseases Contact information: [place] [address] [phone]        Truitt Merle, MD Follow up.   Specialties:  Hematology, Oncology Contact information: [place] [address] [phone]            The results of significant diagnostics from this hospitalization (including imaging, microbiology, ancillary and laboratory) are listed below for reference.    Significant Diagnostic Studies: Dg Chest 2 View  Result Date: 02/16/2017 CLINICAL DATA:  Fatigue.  Cough and fever. EXAM: CHEST  2 VIEW COMPARISON:  02/09/2017 FINDINGS: Right chest wall port Daylyn Azbill catheter is noted with tip at the level of the cavoatrial junction. Heart size is normal. No pleural effusion or edema. No airspace opacities identified. IMPRESSION: 1. No active cardiopulmonary abnormality.  Electronically Signed   By: Kerby Moors M.D.   On: 02/16/2017 19:29   Dg Chest 2 View  Result Date: 02/09/2017 CLINICAL DATA:  Productive cough and URI.  Evaluate for pneumonia. EXAM: CHEST  2 VIEW COMPARISON:  Chest x-ray dated May 11, 2016. FINDINGS: Unchanged right chest wall port catheter with the tip in the proximal right atrium. Stable borderline cardiomegaly. Normal pulmonary vascularity. Mild patchy opacity in the right lower lobe. No pleural effusion or pneumothorax. No acute osseous abnormality. IMPRESSION: Mild patchy opacity in the right lower lobe, which could reflect atelectasis or pneumonia. Electronically Signed   By: Titus Dubin M.D.   On: 02/09/2017  15:38   US Liver Doppler  Result Date: 02/17/2017 CLINICAL DATA:  Right upper abdominal pain, transaminitis EXAM: DUPLEX ULTRASOUND OF LIVER TECHNIQUE: Color and duplex Doppler ultrasound was performed to evaluate the hepatic in-flow and out-flow vessels. COMPARISON:  01/15/2017 and previous FINDINGS: Portal Vein 1.2 cm diameter. No evidence of occlusion or thrombus. Velocities (all hepatopetal): Main:  24-29 cm/sec Right:  18 cm/sec Left:  16 cm/sec Hepatic Vein Velocities (all hepatofugal): Right:  29 cm/sec Middle:  57 cm/sec Left:  47 cm/sec Intrahepatic IVC patent velocities up to 156 cm/second. Hepatic Artery Velocity:  163 cm/sec, RI = 0.6 Spleen 7.7 x 4.4 x 9.4 cm (volume = 170 cm^3). Complex cystic process with shadowing components are around the periphery of the splenic capsule as has been demonstrated on imaging dating back to 10/23/2008. No evidence of splenic vein occlusion or thrombus; velocity: 23 cm/sec Varices: None seen Ascites: None seen IMPRESSION: 1. Unremarkable hepatic vascular Doppler ultrasound evaluation. 2. Chronic complex cystic perisplenic process as has been described previously. Electronically Signed   By: Lucrezia Europe M.D.   On: 02/17/2017 12:38   US Abdomen Limited Ruq  Result Date: 02/17/2017 CLINICAL  DATA:  Transaminitis. EXAM: ULTRASOUND ABDOMEN LIMITED RIGHT UPPER QUADRANT COMPARISON:  Abdominal ultrasound of February 16, 2016 FINDINGS: The study is somewhat limited due to the patient's body habitus and limited acoustical windows. Gallbladder: No gallstones or wall thickening visualized. No sonographic Murphy sign noted by sonographer. Common bile duct: Diameter: 2.9 mm Liver: The hepatic echotexture is normal. In the posterior aspect of the right lobe there is Doreen Garretson simple appearing cyst measuring 1.4 x 1.0 x 1.1 cm. There is no intrahepatic ductal dilation. Portal vein is patent on color Doppler imaging with normal direction of blood flow towards the liver. IMPRESSION: Normal appearance of the gallbladder and common bile duct. Simple appearing right lobe hepatic cyst. The liver is otherwise unremarkable where visualized. Electronically Signed   By: David  Martinique M.D.   On: 02/17/2017 11:36    Microbiology: Recent Results (from the past 240 hour(s))  Culture, blood (Routine X 2) w Reflex to Sheryl [name] Panel     Status: None   Collection Time: 02/16/17  4:51 PM  Result Value Ref Range Status   Specimen Description BLOOD RIGHT HAND  Final   Special Requests IN PEDIATRIC BOTTLE Blood Culture adequate volume  Final   Culture   Final    NO GROWTH 5 DAYS Performed at [facility], [place] [address] [place], [place] [address]    Report Status 02/21/2017 FINAL  Final  Culture, blood (Routine X 2) w Reflex to Sheryl [name] Panel     Status: None   Collection Time: 02/16/17  4:58 PM  Result Value Ref Range Status   Specimen Description BLOOD RIGHT HAND  Final   Special Requests IN PEDIATRIC BOTTLE Blood Culture adequate volume  Final   Culture   Final    NO GROWTH 5 DAYS Performed at [facility], [place] [address] [place], [place] [address]    Report Status 02/21/2017 FINAL  Final  Culture, Urine     Status: Abnormal   Collection Time: 02/16/17  5:42 PM  Result Value Ref Range Status   Specimen Description  URINE, CLEAN CATCH  Final   Special Requests NONE  Final   Culture (Idamay Hosein)  Final    <10,000 COLONIES/mL INSIGNIFICANT GROWTH Performed at [facility], [place] [address], [place], [place] [address]    Report  Status 02/18/2017 FINAL  Final  Culture, blood (single) w Reflex to Sheryl [name] Panel     Status: None   Collection Time: 02/16/17  5:44 PM  Result Value Ref Range Status   Specimen Description BLOOD PORTA CATH  Final   Special Requests   Final    BOTTLES DRAWN AEROBIC AND ANAEROBIC Blood Culture adequate volume   Culture   Final    NO GROWTH 5 DAYS Performed at [facility], [address], [place], [place] [address]    Report Status 02/21/2017 FINAL  Final  MRSA PCR Screening     Status: None   Collection Time: 02/17/17  7:32 AM  Result Value Ref Range Status   MRSA by PCR NEGATIVE NEGATIVE Final    Comment:        The GeneXpert MRSA Assay (FDA approved for NASAL specimens only), is one component of Kanan Sobek comprehensive MRSA colonization surveillance program. It is not intended to diagnose MRSA infection nor to guide or monitor treatment for MRSA infections.      Labs: Basic Metabolic Panel: Recent Labs  Lab 02/17/17 0400 02/18/17 0603 02/19/17 0428 02/19/17 1841 02/20/17 0342 02/21/17 0525  NA 138 135 136  --  138 135  K 3.2* 3.7 3.2*  --  4.1 3.6  CL 108 102 106  --  112* 110  CO2 _0 --  20* 21*  GLUCOSE 83 75 107*  --  75 91  BUN _1 --  5* <5*  CREATININE 0.53 0.59 0.49  --  0.32* 0.31*  CALCIUM 7.8* 7.9* 7.6*  --  7.5* 7.3*  MG  --   --   --  1.6* 1.5* 1.6*   Liver Function Tests: Recent Labs  Lab 02/17/17 0400 02/18/17 0603 02/19/17 0428 02/19/17 1841 02/20/17 0342 02/21/17 0525  AST 1,955* 3,477* 3,110*  --  2,809* 2,617*  ALT 596* 717* 672*  --  688* 645*  ALKPHOS 192* 180* 188*  --  172* 182*  BILITOT 1.8* 3.1* 4.1* 4.6* 4.7* 5.4*  PROT 4.6* 4.9* 4.8*  --  4.7* 4.7*  ALBUMIN 2.4* 2.5* 2.4*  --  2.4* 2.5*   Recent Labs  Lab  02/16/17 1609  LIPASE 66*   No results for input(s): AMMONIA in the last 168 hours. CBC: Recent Labs  Lab 02/16/17 1134 02/17/17 0400 02/18/17 0603 02/19/17 0428 02/20/17 0342 02/21/17 0525  WBC 6.4 3.3* 3.3* 3.3* 3.4* 3.7*  NEUTROABS 4.8  --   --   --   --   --   HGB 12.1 9.2* 9.0* 9.5* 9.2* 9.4*  HCT 36.7 27.3* 26.6* 28.3* 27.5* 28.6*  MCV 80.0 77.6* 77.6* 78.4 78.3 79.4  PLT 149 77* 78* 81* 72* 122*   Cardiac Enzymes: No results for input(s): CKTOTAL, CKMB, CKMBINDEX, TROPONINI in the last 168 hours. BNP: BNP (last 3 results) No results for input(s): BNP in the last 8760 hours.  ProBNP (last 3 results) No results for input(s): PROBNP in the last 8760 hours.  CBG: No results for input(s): GLUCAP in the last 168 hours.     Signed:  Fayrene Helper MD.  [facility] 02/21/2017, 7:25 PM

## 2017-02-21 NOTE — Progress Notes (Signed)
Pt discharged from the unit. Discharge instructions reviewed with the patient and family. Discharged to downstairs via wheelchair.  Tremell Reimers W Kirsta Probert, RN

## 2017-02-21 NOTE — Progress Notes (Signed)
    [facility] for Infectious Disease    Date of Admission:  02/16/2017   Total days of antibiotics 6        Day 6 tdf        Day 6 acyclovir           ID: Sheryl [name] is a 47 y.o. female with hep b flare p/w acute transaminitis and fever Principal Problem:   Abnormal LFTs Active Problems:   Hepatitis B   Dehydration   Fever   Transaminitis   Chills with fever   SIRS (systemic inflammatory response syndrome) (HCC)   Thrush    Subjective: Wants to go home. She reports sleeping poorly last night, afebrile, less coughing  Medications:  . acyclovir  800 mg Oral BID  . bisacodyl  10 mg Rectal Once  . docusate sodium  100 mg Oral BID  . feeding supplement (ENSURE ENLIVE)  237 mL Oral BID BM  . pantoprazole  40 mg Oral Daily  . polyethylene glycol  17 g Oral BID  . senna  2 tablet Oral QHS  . tenofovir  300 mg Oral Daily    Objective: Vital signs in last 24 hours: Temp:  [97.4 F (36.3 C)-98.5 F (36.9 C)] 98.5 F (36.9 C) (01/19 0517) Pulse Rate:  [66-80] 78 (01/19 0517) Resp:  [14-18] 18 (01/19 0517) BP: (105-112)/(67-74) 105/67 (01/19 0517) SpO2:  [100 %] 100 % (01/19 0517)  Physical Exam  Constitutional:  oriented to person, place, and time. appears well-developed and well-nourished. No distress.  HENT: [place]/AT, PERRLA, no scleral icterus Mouth/Throat: Oropharynx is clear and moist. No oropharyngeal exudate.  Cardiovascular: Normal rate, regular rhythm and normal heart sounds. Exam reveals no gallop and no friction rub.  No murmur heard.  Pulmonary/Chest: nl effort. CTAB. No w/c/r Abdominal: Soft. Bowel sounds are normal.  exhibits no distension. There is no tenderness.  Neurological: alert and oriented to person, place, and time.  Skin: Skin is warm and dry. No rash noted. No erythema.  Psychiatric: a normal mood and affect.  behavior is normal.    Lab Results Recent Labs    02/20/17 0342 02/21/17 0525  WBC 3.4* 3.7*  HGB 9.2* 9.4*  HCT 27.5* 28.6*  NA  138 135  K 4.1 3.6  CL 112* 110  CO2 20* 21*  BUN 5* <5*  CREATININE 0.32* 0.31*   Liver Panel Recent Labs    02/20/17 0342 02/21/17 0525  PROT 4.7* 4.7*  ALBUMIN 2.4* 2.5*  AST 2,809* 2,617*  ALT 688* 645*  ALKPHOS 172* 182*  BILITOT 4.7* 5.4*  BILIDIR 3.1* 3.7*  IBILI 1.6* 1.7*    Microbiology: reviewed Studies/Results: No results found.   Assessment/Plan: Chronic hepatitis b with hep b flare =  Some mild improvement in her AST/AST today. Continue on tenofovir 300mg  daily. Her daughter is picking up meds today from [facility]. She knows to stop taking entecavir and continue with tenofovir 300mg  daily. Will arrange follow up in the ID clinic  coags also improving  From id standpoint, can discharge home today  [facility] for Infectious Diseases Cell: [phone] Pager: [phone]  02/21/2017, 1:22 PM

## 2017-02-22 DIAGNOSIS — R059 Cough, unspecified: Secondary | ICD-10-CM

## 2017-02-22 DIAGNOSIS — R05 Cough: Secondary | ICD-10-CM

## 2017-02-23 ENCOUNTER — Inpatient Hospital Stay: Payer: Medicaid Other

## 2017-02-23 ENCOUNTER — Inpatient Hospital Stay (HOSPITAL_BASED_OUTPATIENT_CLINIC_OR_DEPARTMENT_OTHER): Payer: Medicaid Other | Admitting: Hematology

## 2017-02-23 VITALS — BP 101/65 | HR 82 | Temp 97.8°F | Resp 17 | Wt 134.5 lb

## 2017-02-23 DIAGNOSIS — E099 Drug or chemical induced diabetes mellitus without complications: Secondary | ICD-10-CM

## 2017-02-23 DIAGNOSIS — R5383 Other fatigue: Secondary | ICD-10-CM

## 2017-02-23 DIAGNOSIS — B1910 Unspecified viral hepatitis B without hepatic coma: Secondary | ICD-10-CM

## 2017-02-23 DIAGNOSIS — R740 Nonspecific elevation of levels of transaminase and lactic acid dehydrogenase [LDH]: Secondary | ICD-10-CM

## 2017-02-23 DIAGNOSIS — C9010 Plasma cell leukemia not having achieved remission: Secondary | ICD-10-CM

## 2017-02-23 DIAGNOSIS — Z95828 Presence of other vascular implants and grafts: Secondary | ICD-10-CM

## 2017-02-23 DIAGNOSIS — R17 Unspecified jaundice: Secondary | ICD-10-CM

## 2017-02-23 DIAGNOSIS — R634 Abnormal weight loss: Secondary | ICD-10-CM | POA: Diagnosis not present

## 2017-02-23 DIAGNOSIS — R630 Anorexia: Secondary | ICD-10-CM | POA: Diagnosis not present

## 2017-02-23 DIAGNOSIS — K219 Gastro-esophageal reflux disease without esophagitis: Secondary | ICD-10-CM

## 2017-02-23 DIAGNOSIS — C9000 Multiple myeloma not having achieved remission: Secondary | ICD-10-CM

## 2017-02-23 DIAGNOSIS — C9012 Plasma cell leukemia in relapse: Secondary | ICD-10-CM | POA: Diagnosis not present

## 2017-02-23 DIAGNOSIS — T380X5A Adverse effect of glucocorticoids and synthetic analogues, initial encounter: Secondary | ICD-10-CM

## 2017-02-23 LAB — CBC WITH DIFFERENTIAL/PLATELET
BAND NEUTROPHILS: 0 %
BASOS ABS: 0 10*3/uL (ref 0.0–0.1)
BASOS PCT: 0 %
Blasts: 0 %
EOS ABS: 0 10*3/uL (ref 0.0–0.5)
EOS PCT: 1 %
HCT: 30 % — ABNORMAL LOW (ref 34.8–46.6)
Hemoglobin: 9.5 g/dL — ABNORMAL LOW (ref 11.6–15.9)
LYMPHS ABS: 0.9 10*3/uL (ref 0.9–3.3)
Lymphocytes Relative: 20 %
MCH: 25.7 pg (ref 25.1–34.0)
MCHC: 31.7 g/dL (ref 31.5–36.0)
MCV: 81.1 fL (ref 79.5–101.0)
METAMYELOCYTES PCT: 0 %
MONO ABS: 0.3 10*3/uL (ref 0.1–0.9)
MYELOCYTES: 0 %
Monocytes Relative: 6 %
Neutro Abs: 3.5 10*3/uL (ref 1.5–6.5)
Neutrophils Relative %: 73 %
Other: 0 %
PLATELETS: 138 10*3/uL — AB (ref 145–400)
Promyelocytes Absolute: 0 %
RBC: 3.69 MIL/uL — ABNORMAL LOW (ref 3.70–5.45)
RDW: 16.6 % — ABNORMAL HIGH (ref 11.2–16.1)
WBC: 4.7 10*3/uL (ref 3.9–10.3)
nRBC: 0 /100 WBC

## 2017-02-23 LAB — COMPREHENSIVE METABOLIC PANEL
ALBUMIN: 2.4 g/dL — AB (ref 3.5–5.0)
ALT: 570 U/L — AB (ref 0–55)
AST: 2017 U/L — AB (ref 5–34)
Alkaline Phosphatase: 210 U/L — ABNORMAL HIGH (ref 40–150)
Anion gap: 7 (ref 3–11)
BUN: 8 mg/dL (ref 7–26)
CHLORIDE: 112 mmol/L — AB (ref 98–109)
CO2: 21 mmol/L — AB (ref 22–29)
CREATININE: 0.6 mg/dL (ref 0.60–1.10)
Calcium: 7.7 mg/dL — ABNORMAL LOW (ref 8.4–10.4)
GFR calc non Af Amer: >60 mL/min (ref >60)
Glucose, Bld: 144 mg/dL — ABNORMAL HIGH (ref 70–140)
Potassium: 3.4 mmol/L (ref 3.3–4.7)
SODIUM: 140 mmol/L (ref 136–145)
Total Bilirubin: 7.2 mg/dL (ref 0.2–1.2)
Total Protein: 4.5 g/dL — ABNORMAL LOW (ref 6.4–8.3)

## 2017-02-23 MED ORDER — SODIUM CHLORIDE 0.9 % IV SOLN
Freq: Once | INTRAVENOUS | Status: AC
  Administered 2017-02-23: 13:00:00 via INTRAVENOUS

## 2017-02-23 MED ORDER — SODIUM CHLORIDE 0.9 % IJ SOLN
10.0000 mL | INTRAMUSCULAR | Status: DC | PRN
  Administered 2017-02-23: 10 mL via INTRAVENOUS
  Filled 2017-02-23: qty 10

## 2017-02-23 MED ORDER — ONDANSETRON HCL 4 MG/2ML IJ SOLN
8.0000 mg | Freq: Once | INTRAMUSCULAR | Status: AC
  Administered 2017-02-23: 8 mg via INTRAVENOUS

## 2017-02-23 MED ORDER — ONDANSETRON HCL 4 MG/2ML IJ SOLN
INTRAMUSCULAR | Status: AC
  Filled 2017-02-23: qty 4

## 2017-02-23 MED ORDER — SODIUM CHLORIDE 0.9 % IV SOLN: Freq: Once | INTRAVENOUS | Status: DC

## 2017-02-23 MED ORDER — HEPARIN SOD (PORK) LOCK FLUSH 100 UNIT/ML IV SOLN
500.0000 [IU] | Freq: Once | INTRAVENOUS | Status: AC | PRN
  Administered 2017-02-23: 500 [IU] via INTRAVENOUS
  Filled 2017-02-23: qty 5

## 2017-02-23 MED ORDER — SODIUM CHLORIDE 0.9 % IV SOLN
INTRAVENOUS | Status: DC
  Administered 2017-02-23: 11:00:00 via INTRAVENOUS

## 2017-02-23 NOTE — Progress Notes (Signed)
Per Dr Burr Medico no tx today, just IVF

## 2017-02-23 NOTE — Patient Instructions (Addendum)
[facility] Discharge Instructions for Patients Receiving Chemotherapy  Today you received the following chemotherapy agents Velcade  To help prevent nausea and vomiting after your treatment, we encourage you to take your nausea medication as prescribed.  If you develop nausea and vomiting that is not controlled by your nausea medication, call the clinic.   BELOW ARE SYMPTOMS THAT SHOULD BE REPORTED IMMEDIATELY:  *FEVER GREATER THAN 100.5 F  *CHILLS WITH OR WITHOUT FEVER  NAUSEA AND VOMITING THAT IS NOT CONTROLLED WITH YOUR NAUSEA MEDICATION  *UNUSUAL SHORTNESS OF BREATH  *UNUSUAL BRUISING OR BLEEDING  TENDERNESS IN MOUTH AND THROAT WITH OR WITHOUT PRESENCE OF ULCERS  *URINARY PROBLEMS  *BOWEL PROBLEMS  UNUSUAL RASH Items with * indicate a potential emergency and should be followed up as soon as possible.  Feel free to call the clinic should you have any questions or concerns. The clinic phone number is [phone].  Please show the [facility] at check-in to the Emergency Department and triage nurse.     Dehydration, Adult Dehydration is when there is not enough fluid or water in your body. This happens when you lose more fluids than you take in. Dehydration can range from mild to very bad. It should be treated right away to keep it from getting very bad. Symptoms of mild dehydration may include:  Thirst.  Dry lips.  Slightly dry mouth.  Dry, warm skin.  Dizziness. Symptoms of moderate dehydration may include:  Very dry mouth.  Muscle cramps.  Dark pee (urine). Pee may be the color of tea.  Your body making less pee.  Your eyes making fewer tears.  Heartbeat that is uneven or faster than normal (palpitations).  Headache.  Light-headedness, especially when you stand up from sitting.  Fainting (syncope). Symptoms of very bad dehydration may include:  Changes in skin, such as: ? Cold and clammy skin. ? Blotchy (mottled) or  pale skin. ? Skin that does not quickly return to normal after being lightly pinched and let go (poor skin turgor).  Changes in body fluids, such as: ? Feeling very thirsty. ? Your eyes making fewer tears. ? Not sweating when body temperature is high, such as in hot weather. ? Your body making very little pee.  Changes in vital signs, such as: ? Weak pulse. ? Pulse that is more than 100 beats a minute when you are sitting still. ? Fast breathing. ? Low blood pressure.  Other changes, such as: ? Sunken eyes. ? Cold hands and feet. ? Confusion. ? Lack of energy (lethargy). ? Trouble waking up from sleep. ? Short-term weight loss. ? Unconsciousness. Follow these instructions at home:  If told by your doctor, drink an ORS: ? Make an ORS by using instructions on the package. ? Start by drinking small amounts, about  cup (120 mL) every 5-10 minutes. ? Slowly drink more until you have had the amount that your doctor said to have.  Drink enough clear fluid to keep your pee clear or pale yellow. If you were told to drink an ORS, finish the ORS first, then start slowly drinking clear fluids. Drink fluids such as: ? Water. Do not drink only water by itself. Doing that can make the salt (sodium) level in your body get too low (hyponatremia). ? Ice chips. ? Fruit juice that you have added water to (diluted). ? Low-calorie sports drinks.  Avoid: ? Alcohol. ? Drinks that have a lot of sugar. These include high-calorie sports drinks, fruit  juice that does not have water added, and soda. ? Caffeine. ? Foods that are greasy or have a lot of fat or sugar.  Take over-the-counter and prescription medicines only as told by your doctor.  Do not take salt tablets. Doing that can make the salt level in your body get too high (hypernatremia).  Eat foods that have minerals (electrolytes). Examples include bananas, oranges, potatoes, tomatoes, and spinach.  Keep all follow-up visits as told by  your doctor. This is important. Contact a doctor if:  You have belly (abdominal) pain that: ? Gets worse. ? Stays in one area (localizes).  You have a rash.  You have a stiff neck.  You get angry or annoyed more easily than normal (irritability).  You are more sleepy than normal.  You have a harder time waking up than normal.  You feel: ? Weak. ? Dizzy. ? Very thirsty.  You have peed (urinated) only a small amount of very dark pee during 6-8 hours. Get help right away if:  You have symptoms of very bad dehydration.  You cannot drink fluids without throwing up (vomiting).  Your symptoms get worse with treatment.  You have a fever.  You have a very bad headache.  You are throwing up or having watery poop (diarrhea) and it: ? Gets worse. ? Does not go away.  You have blood or something green (bile) in your throw-up.  You have blood in your poop (stool). This may cause poop to look black and tarry.  You have not peed in 6-8 hours.  You pass out (faint).  Your heart rate when you are sitting still is more than 100 beats a minute.  You have trouble breathing. This information is not intended to replace advice given to you by your health care provider. Make sure you discuss any questions you have with your health care provider. Document Released: 11/16/2008 Document Revised: 08/10/2015 Document Reviewed: 03/16/2015 Elsevier Interactive Patient Education  2018 [employer].

## 2017-02-23 NOTE — Progress Notes (Signed)
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      10/10/2014 Miscellaneous    Peripheral blood chemistry and leukocytosis with total white count 78K, comprised of large plasma cells and his normocytic anemia. There is a myeloid left shift with previous surgical radium blasts. Flow cytometry showed 64% plasma cells      10/10/2014 Bone Marrow Biopsy    Markedly hypercellular marrow (95%), Atypical plasma cells comprise 57% of the cellularity. There was diminished multilineage in hematopoiesis with adequate maturation. Breasts less than 1%), no overt dysplasia of the myeloid or erythroid lineages.       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      05/07/2015 - 05/12/2015 Hospital Admission    [facility], [facility] ID workup was negative, EGD showed evidence of gastritis and duodenitis, no H. pylori or CMV.      05/20/2015 - 05/24/2015 Hospital Admission    [facility]      07/11/2015 Bone Marrow Biopsy    Post transplant 100 a bone marrow biopsy showed hypocellular marrow, 20%, no increase in plasma cells (2%) or other abnormalities.      08/22/2015 - 01/02/2016 Chemotherapy    MaintenanceCyborD (cytoxan 305mm2 iv, bortezomib 1.5 mg/m, dexamethasone 40 mg, every 2 weeks, changed to Velcade maintenance after 4 months treatment      01/16/2016 - 04/19/2016 Chemotherapy    Maintenance Velcade 1.3 mg/m every 2 weeks      02/22/2016 Pathology Results    BONE MARROW: Diagnosis Bone Marrow, Aspirate,Biopsy, and Clot, right iliac - NORMOCELLULAR BONE MARROW FOR AGE WITH TRILINEAGE HEMATOPOIESIS. - PLASMACYTOSIS (PLASMA CELLS 12%). - SEE COMMENT. PERIPHERAL BLOOD: - OCCASIONAL CIRCULATING PLASMA CELLS.      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 female is here because of recently diagnosed plasma cell leukemia. [facility] and is here to establish her local oncological care with [place] speak [place] is accompanied to the clinic by  her daughter and interpreter.  She presented to our hospital lth with worsening dyspnea, fatigue, cough, subjective fevers and chills, and was admitted on 09/14/2014. She was found to have allergy WBC 54.1K, hemoglobin 8.1, plt 310, she was treated with symptom management, and was discharged home on 09/17/2014, with a plan to follow-up with hematology. She represents to emergency room on  10/07/2014, was found to have white count of 78K, and worsening anemia with hemoglobin 6. She was seen by my partner Dr. Jonette Eva and plasma cell leukemia was suspected. She was transferred to [facility] for further leukemia work-up and treatment. Bone marrow biopsy was done, which confirmed plasma cell leukemia, and she started chemotherapy with Velcade, Cytoxan and dexamethasone. She received weekly dose twice, last dose on 10/20/2014. She was discharged to home afterwards. She had a mediport placed during her hospitalization.  She has moderate fatigue, low appetite, she lost about 13 lbs in the past few months. She has moderate abdominal pain, 5-6/10, persistent, she does not take any pain meds.   I reviewed her medical records extensively, and discussed her case with Dr. Jorje Guild and [place] program coordinator.  CURRENT THERAPY:   1. Velcade 1.19m/m2 and dexa 437mweekly 2. Daratumumab weekly started on 04/18/2016, changed to every 2 weeks on 06/25/2016 and changed to every 4 weeks after 10/13/2016, per protocol  3. zometa every 4 weeks started on 11/27/2015, plan for 2 years    INTERIM HISTORY:   OeSimss here in the infusion room.   Pt was admitted to the hospital on 02/16/17 for significant transaminitis and low-grade fever.  ID workup was negative.  She was seen by infectious disease Dr. SnBaxter Flatteryho felt her significant abnormal liver function was related to her hepatitis B flare, tested hepatitis B titer was very high.  Ultrasound of the liver was negative.  She was initially treated with broad antibiotics, which was subsequently discontinued after the negative cultures.  She was discharged home 2 days ago.  She still has significant fatigue, mild intermittent nausea and vomiting, no appetite, eats little.  She denies any fever since her discharge, no abdominal pain or other discomfort.  She is accompanied by her daughter to our office today.   MEDICAL HISTORY:  Past Medical  History:  Diagnosis Date  . Chills with fever    intermittently since d/c from hospital  . Dysuria-frequency syndrome    w/ pink urine  . GERD (gastroesophageal reflux disease)   . Hepatitis   . History of positive PPD    DX 2011--  CXR DONE NO EVIDENCE  . History of ureter stent   . Hydronephrosis, right   . Neuromuscular disorder (HCC)    legs numb intermittently  . 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HCHyde  . Pneumonia   . Right ureteral stone   . Urosepsis 8/14   ad[facility]    SURGICAL HISTORY: Past Surgical History:  Procedure Laterality Date  . CYSTOSCOPY W/ URETERAL STENT PLACEMENT Right 09/25/2012   Procedure: CYSTOSCOPY WITH RETROGRADE PYELOGRAM/URETERAL STENT PLACEMENT;  Surgeon: ThAlexis FrockMD;  Location: [facility];  Service: Urology;  Laterality: Right;  . CYSTOSCOPY WITH RETROGRADE PYELOGRAM, URETEROSCOPY AND STENT PLACEMENT Right 10/15/2012   Procedure: CYSTOSCOPY WITH RETROGRADE PYELOGRAM, URETEROSCOPY AND REMOVAL STENT WITH  STENT PLACEMENT;  Surgeon: ThAlexis FrockMD;  [facility];  Laterality: Right;  . ESOPHAGOGASTRODUODENOSCOPY (EGD) WITH PROPOFOL N/A 11/16/2014   Procedure: ESOPHAGOGASTRODUODENOSCOPY (EGD) WITH PROPOFOL;  Surgeon: DaQuillian Quince  Merrily Brittle, MD;  Location: [facility];  Service: Endoscopy;  Laterality: N/A;  . HOLMIUM LASER APPLICATION Right 0/94/0768   Procedure: HOLMIUM LASER APPLICATION;  Surgeon: Alexis Frock, MD;  Location: [facility];  Service: Urology;  Laterality: Right;  . LIVER BIOPSY    . OTHER SURGICAL HISTORY Right    removal of ovarian cyst  . removal of uterine cyst     years ago  . RIGHT VATS W/ DRAINAGE PEURAL EFFUSION AND BX'S  10-30-2008    SOCIAL HISTORY: Social History   Socioeconomic History  . Marital status: Single    Spouse name: Not on file  . Number of children: 3  . Years of education: Not on file  . Highest education level: Not on file  Social Needs  .  Financial resource strain: Not on file  . Food insecurity - worry: Not on file  . Food insecurity - inability: Not on file  . Transportation needs - medical: Not on file  . Transportation needs - non-medical: Not on file  Occupational History  . Not on file  Tobacco Use  . Smoking status: Never Smoker  . Smokeless tobacco: Never Used  Substance and Sexual Activity  . Alcohol use: No    Alcohol/week: 0.0 oz  . Drug use: No  . Sexual activity: Not Currently    Birth control/protection: Abstinence  Other Topics Concern  . Not on file  Social History Narrative  . Not on file    FAMILY HISTORY: Family History  Problem Relation Age of Onset  . Stomach cancer Mother   . Lung disease Father   . Asthma Father     ALLERGIES:  has No Known Allergies.  MEDICATIONS:  Current Outpatient Medications  Medication Sig Dispense Refill  . acyclovir (ZOVIRAX) 800 MG tablet Take 1 tablet (800 mg total) by mouth 2 (two) times daily. 60 tablet 2  . dexamethasone (DECADRON) 4 MG tablet Take 10 tablets (40 mg total) by mouth once a week. 40 tablet 2  . feeding supplement, ENSURE ENLIVE, (ENSURE ENLIVE) LIQD Take 237 mLs by mouth 2 (two) times daily between meals. 237 mL 12  . metFORMIN (GLUCOPHAGE) 500 MG tablet Take 1 tablet (500 mg total) by mouth 2 (two) times daily with a meal. 60 tablet 2  . omeprazole (PRILOSEC) 20 MG capsule Take 1 capsule (20 mg total) by mouth daily. (Patient taking differently: Take 20 mg by mouth 2 (two) times daily before a meal. ) 30 capsule 5  . ondansetron (ZOFRAN) 8 MG tablet Take 1 tablet (8 mg total) by mouth every 8 (eight) hours as needed for nausea or vomiting. 30 tablet 1  . polyethylene glycol (MIRALAX / GLYCOLAX) packet Take 17 g by mouth daily as needed (constipation).     . prochlorperazine (COMPAZINE) 10 MG tablet Take 1 tablet (10 mg total) by mouth every 6 (six) hours as needed for nausea or vomiting. 30 tablet 0  . promethazine (PHENERGAN) 25 MG tablet  Take 1 tablet (25 mg total) by mouth every 6 (six) hours as needed for nausea or vomiting. 30 tablet 0  . tenofovir (VIREAD) 300 MG tablet Take 1 tablet (300 mg total) by mouth daily. 30 tablet 6   No current facility-administered medications for this visit.    Facility-Administered Medications Ordered in Other Visits  Medication Dose Route Frequency Provider Last Rate Last Dose  . sodium chloride 0.9 % injection 10 mL  10 mL Intravenous PRN Truitt Merle, MD  10 mL at 12/11/15 1532  . sodium chloride 0.9 % injection 10 mL  10 mL Intravenous PRN Truitt Merle, MD   10 mL at 06/11/16 1505  . sodium chloride flush (NS) 0.9 % injection 10 mL  10 mL Intravenous PRN Truitt Merle, MD   10 mL at 10/09/15 1717    REVIEW OF SYSTEMS:  Constitutional: no abnormal night sweats  Eyes: Denies blurriness of vision, double vision or watery eyes.  Ears, nose, mouth, throat, and face: Denies mucositis  Respiratory: Denies, dyspnea or wheezes  Cardiovascular: Denies palpitation, chest discomfort or lower extremity swelling Gastrointestinal:  Denies nausea, heartburn, reports regular bowel/bladder (+) occasional bloating  Skin: Denies abnormal skin rashes Musculoskeletal: (+) Back pain Lymphatics: Denies new lymphadenopathy or easy bruising Neurological:Denies numbness, tingling or new weaknesses Behavioral/Psych: Mood is stable, no new changes  All other systems were reviewed with the patient and are negative.  PHYSICAL EXAMINATION:   ECOG PERFORMANCE STATUS: 3 Blood pressure 101/65, heart rate 82, respiratory rate 17, temperature 36.6, pulse ox 100%. GENERAL: She was wrapped with multiple blankets, not in respiratory distress. SKIN: skin color, texture, turgor are normal, no rashes or significant lesions, \\ EYES: normal, conjunctiva are pink and non-injected, sclera (+) jaundice OROPHARYNX:no exudate, no erythema and lips, buccal mucosa NECK: supple, thyroid normal size, non-tender, without nodularity LYMPH:   no palpable lymphadenopathy in the cervical, axillary or inguinal LUNGS: clear to auscultation and percussion with normal breathing effort HEART: regular rate & rhythm and no murmurs and no lower extremity edema ABDOMEN:abdomen soft, moderate tenderness in the right upper quadrant of abdomen, no rebound pain, no Murphy sign, no hepatomegaly. Musculoskeletal:no cyanosis of digits and no clubbing  PSYCH: alert & oriented x 3 with fluent speech NEURO: no focal motor/sensory deficits  LABORATORY DATA:  I have reviewed the data as listed CBC Latest Ref Rng & Units 02/23/2017 02/21/2017 02/20/2017  WBC 3.9 - 10.3 K/uL 4.7 3.7(L) 3.4(L)  Hemoglobin 11.6 - 15.9 g/dL 9.5(L) 9.4(L) 9.2(L)  Hematocrit 34.8 - 46.6 % 30.0(L) 28.6(L) 27.5(L)  Platelets 145 - 400 K/uL 138(L) 122(L) 72(L)    CMP Latest Ref Rng & Units 02/23/2017 02/21/2017 02/20/2017  Glucose 70 - 140 mg/dL 144(H) 91 75  BUN 7 - 26 mg/dL 8 <5(L) 5(L)  Creatinine 0.60 - 1.10 mg/dL 0.60 0.31(L) 0.32(L)  Sodium 136 - 145 mmol/L 140 135 138  Potassium 3.3 - 4.7 mmol/L 3.4 3.6 4.1  Chloride 98 - 109 mmol/L 112(H) 110 112(H)  CO2 22 - 29 mmol/L 21(L) 21(L) 20(L)  Calcium 8.4 - 10.4 mg/dL 7.7(L) 7.3(L) 7.5(L)  Total Protein 6.4 - 8.3 g/dL 4.5(L) 4.7(L) 4.7(L)  Total Bilirubin 0.2 - 1.2 mg/dL 7.2(HH) 5.4(H) 4.7(H)  Alkaline Phos 40 - 150 U/L 210(H) 182(H) 172(H)  AST 5 - 34 U/L 2,017(HH) 2,617(H) 2,809(H)  ALT 0 - 55 U/L 570(HH) 645(H) 688(H)    SPEP M-protein  09/15/2014: 4.2 10/08/14: 4.6 12/08/2014: 0.2 02/15/2015: not sufficient sample for test   09/11/2015: not observed 10/09/2015: not det   11/06/2015: not det  12/25/2015: not det  02/13/2016: 0.3 03/12/2016: 0.8 04/09/2016: 0.6 05/08/16: 0.1 06/04/16: 0.1 07/02/2016: 0.2 07/09/2016: 0.2 (dara specific IFE negative) 08/05/16: Not observed  09/01/16: Not observed 09/29/16: 0.1(dara specific IFE negative) 10/27/16: Not observed 11/24/16 : Not observed 12/23/16: 0.1 (Dara specific IFE showed IgG  monoclonal protein with lambda light chain) 01/21/2017: 0.1 (Dara specific IFE showed IgG monoclonal protein with lambda light chain)   IgG mg/dl 11/03/2014: 3150  12/08/2014:  759 02/14/2014: 860 09/11/2015: 1347 10/09/2015: 1457 11/06/2015: 1504 12/25/2015: 1400 02/13/2016: 1543 03/12/2016: 1860 04/09/2016: 1620 05/08/16: 749 06/04/16: 751 5/30/20187: 796 07/09/2016: 701 08/05/16: 678 09/01/16: 650 09/29/16: 727 10/27/16: 634 11/24/16: 685  12/23/16: 714 01/21/2017: 709   Kappa/lambda light chains levels and ration  11/03/14: 0.75, 152, 0.00 12/22/2014: 1.10, 2.60, 0.42 02/15/2015: 9.59,14.35, 0.67 09/11/2015: 2.44, 2.72, 0.90 10/09/2015: 3.09, 3.49, 0.89 11/06/2015: 2.30, 2.62, 0.88 12/25/2015: 2.51, 3.0, 0.84 02/13/2016: 2.64, 15.3, 0.17 03/12/2016: 2.27, 45.5, 0.05 04/09/2016: 3.33, 45.5, 0.07 05/08/2016: 0.49, 1.21, 0.40 06/04/2016: 0.83, 1.11, 0.75 07/02/16: 0.51, 1.43, 0.36 07/23/16: 0.68, 1.02, 0.67 08/05/16: 0.62, 1.09, 0.57 09/01/16: 0.80, 1.21, 0.66 09/29/16: 0.44, 1.07, 0.41 10/27/16: 0.63, 1.44, 0.44 11/24/16: 0.83, 2.13, 0.39 12/23/16: 0.69, 2.31, 0.30 01/21/2017: 7.7, 30.8, 0.25  24 h urine UPEP/IFE and light chain: 11/03/2014: IFE showed a monoclonal IgG heavy chain with associated lambda light chain. M protein was Undetectable    PATHOLOGY   PATHOLOGY REPORT  Bone Marrow (BM) and Peripheral Blood (PB) FINAL PATHOLOGIC DIAGNOSIS  BONE MARROW: 07/11/2015 Hypocellular marrow (20%) with no increase in plasma cells (2%). See comment. PERIPHERAL BLOOD: Mild anemia. No circulating plasma cells identified. See comment and CBC data.  BONE MARROW: 02/22/2016 Diagnosis Bone Marrow, Aspirate,Biopsy, and Clot, right iliac - NORMOCELLULAR BONE MARROW FOR AGE WITH TRILINEAGE HEMATOPOIESIS. - PLASMACYTOSIS (PLASMA CELLS 12%). - SEE COMMENT. PERIPHERAL BLOOD: - OCCASIONAL CIRCULATING PLASMA CELLS.   PROCEDURES  Bone Marrow Biopsy, 01/21/17 Bone Marrow, Aspirate, Biopsy, and Clot -  NORMOCELLULAR BONE MARROW FOR AGE WITH TRILINEAGE HEMATOPOIESIS AND 2% PLASMA CELLS. PERIPHERAL BLOOD: - NORMOCYTIC-HYPOCHROMIC ANEMIA.   Colonoscopy by Dr. Ardis Hughs on 11/26/16  IMPRESSION - One 2 mm polyp in the transverse colon, removed with a cold biopsy forceps. Resected and retrieved. - Small internal and external hemorrhoids. - The examination was otherwise normal on direct and retroflexion views.   RADIOGRAPHIC STUDIES: I have personally reviewed the radiological images as listed and agreed with the findings in the report. Dg Chest 2 View  Result Date: 02/16/2017 CLINICAL DATA:  Fatigue.  Cough and fever. EXAM: CHEST  2 VIEW COMPARISON:  02/09/2017 FINDINGS: Right chest wall port a catheter is noted with tip at the level of the cavoatrial junction. Heart size is normal. No pleural effusion or edema. No airspace opacities identified. IMPRESSION: 1. No active cardiopulmonary abnormality. Electronically Signed   By: Kerby Moors M.D.   On: 02/16/2017 19:29   Dg Chest 2 View  Result Date: 02/09/2017 CLINICAL DATA:  Productive cough and URI.  Evaluate for pneumonia. EXAM: CHEST  2 VIEW COMPARISON:  Chest x-ray dated May 11, 2016. FINDINGS: Unchanged right chest wall port catheter with the tip in the proximal right atrium. Stable borderline cardiomegaly. Normal pulmonary vascularity. Mild patchy opacity in the right lower lobe. No pleural effusion or pneumothorax. No acute osseous abnormality. IMPRESSION: Mild patchy opacity in the right lower lobe, which could reflect atelectasis or pneumonia. Electronically Signed   By: Titus Dubin M.D.   On: 02/09/2017 15:38   US Liver Doppler  Result Date: 02/17/2017 CLINICAL DATA:  Right upper abdominal pain, transaminitis EXAM: DUPLEX ULTRASOUND OF LIVER TECHNIQUE: Color and duplex Doppler ultrasound was performed to evaluate the hepatic in-flow and out-flow vessels. COMPARISON:  01/15/2017 and previous FINDINGS: Portal Vein 1.2 cm diameter. No  evidence of occlusion or thrombus. Velocities (all hepatopetal): Main:  24-29 cm/sec Right:  18 cm/sec Left:  16 cm/sec Hepatic Vein Velocities (all hepatofugal): Right:  29 cm/sec Middle:  57 cm/sec Left:  47 cm/sec Intrahepatic IVC patent velocities up to 156 cm/second. Hepatic Artery Velocity:  163 cm/sec, RI = 0.6 Spleen 7.7 x 4.4 x 9.4 cm (volume = 170 cm^3). Complex cystic process with shadowing components are around the periphery of the splenic capsule as has been demonstrated on imaging dating back to 10/23/2008. No evidence of splenic vein occlusion or thrombus; velocity: 23 cm/sec Varices: None seen Ascites: None seen IMPRESSION: 1. Unremarkable hepatic vascular Doppler ultrasound evaluation. 2. Chronic complex cystic perisplenic process as has been described previously. Electronically Signed   By: Lucrezia Europe M.D.   On: 02/17/2017 12:38   US Abdomen Limited Ruq  Result Date: 02/17/2017 CLINICAL DATA:  Transaminitis. EXAM: ULTRASOUND ABDOMEN LIMITED RIGHT UPPER QUADRANT COMPARISON:  Abdominal ultrasound of February 16, 2016 FINDINGS: The study is somewhat limited due to the patient's body habitus and limited acoustical windows. Gallbladder: No gallstones or wall thickening visualized. No sonographic Murphy sign noted by sonographer. Common bile duct: Diameter: 2.9 mm Liver: The hepatic echotexture is normal. In the posterior aspect of the right lobe there is a simple appearing cyst measuring 1.4 x 1.0 x 1.1 cm. There is no intrahepatic ductal dilation. Portal vein is patent on color Doppler imaging with normal direction of blood flow towards the liver. IMPRESSION: Normal appearance of the gallbladder and common bile duct. Simple appearing right lobe hepatic cyst. The liver is otherwise unremarkable where visualized. Electronically Signed   By: David  Martinique M.D.   On: 02/17/2017 11:36     CXR 05/08/2016 IMPRESSION:  No acute disease.  CT Biopsy 02/22/16 IMPRESSION: 1. Technically successful CT  guided right iliac bone core and aspiration biopsy.  ASSESSMENT & PLAN: 47 y.o. [place] woman, presented with anemia and leokocytosis    1.  Transaminitis and hyperbilirubinemia, secondary to hepatitis B flare -She has developed significant transaminitis and hyperbilirubinemia since January 2019 -She was previously on hepatitis B treatment entecavir by her GI at [facility], however she was off treatment for 2-3 months in late 2018, due to her issues with refill.  She did refill and restarted at the end of December 2018, her repeated hepatitis B titer was very high in Jan 2019. -Ultrasound of liver and Doppler of liver was negative  -She was recently hospitalized for this, and was switched to Tenofovir by Dr. Baxter Flattery. I recommend her to follow-up with ID Dr. Baxter Flattery  -Repeat his labs showed persistent transaminitis, and worsening hyperbilirubinemia.  I will discuss with GI to see if she needs a MRCP to rule out other etiology. -Tinea lab twice a week  2. 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 Unfortunately this showed increased plasma cells 12% with additional lambda light chain staining pending. This is relapse of disease. Cytogenetics was normal  -Her M protein and lambda free light chain level has significantly increased lately, consistent with disease relapse -Her recent LP showed negative CSF, no evidence of CNS involvement. -I have previously spoken with Dr. Norma Fredrickson, we agreed to change her CyBorD to weekly, and add Daratumumab weekly, starting 04/18/16. Potential side effects would discussed with patient, especially cytopenia and infusion reaction, she will take dexamethasone the day before Dara  infusion.  -continue acyclovir  -Continue Zometa every 4  weeks for a total of 2 years (until 11/2017) - Due to her hospitalization for severe cytopenia and infection, Cytoxan has been held. -she has restarted weekly Velcade and dexa, and now on Dara every 4 weeks, tolerating well, will continue. Her recent dara-specific IFE was negative for M-protein on 07/07/16 at [facility], she has achieved second CR.  -She saw Dr. Norma Fredrickson in 12/2016 and he recommend repeating a bone marrow transplant and a bone survey. Plans to have  this in spring of 2019.  -Her SPEP and IFE detected a small amount M-protein again, the Dara specific immunofixation was positive, this is concerning for recurrence.  We will continue monitor her SPEP and immunofixation every months -Bone Marrow Biopsy on 01/21/2017 showed 2% of plasma cells, monoclonal, no definitive evidence of plasma neoplasm.  It confirmed complete response. --Due to her recently fever, pneumonia and her treatment has been held since 02/02/2017. I will inform Dr. Norma Fredrickson.  3. Type 2 diabetes, steroids induced  -She was noticed to have increased blood glucose level lately, with random blood glucose above 200. She also developed diabetic symptoms. No history of diabetes in the past. -This is likely steroids induced hyperglycemia -I strongly recommend her to avoid any soft drink and sweats, and watch her carbohydrate intake, and exercise more  -She has been seen by her primary care physician. She was not given any medication to manage her glucose. I will call her primary care physician to discuss adding metformin. -I previously instructed her to take metformin every day since she is now taking dexamethasone more frequently.  -She restarted her dexamethasone weekly as part of his leukemia treatment, and she will also receive steroids as premedication, I recommend her to take metformin 500 mg twice daily continuously, to control her hyperglycemia.Potential side effects  discussed, she agreed to proceed. -Continue close monitoring. -We previously discussed restarting to metformin one pill a day while on steroids to see if her levels will stabilize. I previously encouraged her to take it in the morning with breakfast.  -Due to increase in steroids, Pt has been on metformin to twice daily.   -Her BG is now better controlled with metformin.   4. GERD, history of GI bleeding and nausea  -Continue omeprazole daily. We previously discussed steroids can worsen her acid reflux. -She previously had an EGD at [facility] in 2017. This was benign.  -she did not do her colonoscopy because she did not have anyone to accompany her during visit. She has recently seen Dr. Ardis Hughs again, and is scheduled for colonoscopy. -Colonoscopy on 11/26/16 per Dr. Ardis Hughs notable for a polyp in the transverse colon, revealed to be tubular adenoma and negative for high grade dysplasia or malignancy.    Plan  -Hold her dexa, velcade and Dara treatment for now due to her significant transaminitis and hyperbilirubinemia -Repeat lab twice a week -She will follow-up with Dr. Graylon Good -I will see her back next week -will discuss with GI to see if she needs MRCP   All questions were answered. The patient knows to call the clinic with any problems, questions or concerns.  Pt was also seen by symptom management clinic APP Lucianne Lei today. We discussed the management plan.   I spent 20 minutes counseling the patient face to face.  The total time spent in the appointment was 25 minutes and more than 50% was on counseling.      Truitt Merle, MD  02/23/2017

## 2017-02-24 ENCOUNTER — Telehealth: Payer: Self-pay | Admitting: Hematology

## 2017-02-24 ENCOUNTER — Encounter: Payer: Self-pay | Admitting: Hematology

## 2017-02-24 NOTE — Telephone Encounter (Signed)
Used interpreter line to schedule patient for upcoming January appointments per 1/21 sch message. Spoke to patients daughter regarding upcoming appointments.

## 2017-02-25 ENCOUNTER — Other Ambulatory Visit: Payer: Self-pay | Admitting: Hematology

## 2017-02-25 MED ORDER — URSODIOL 300 MG PO CAPS: 300.0000 mg | ORAL_CAPSULE | Freq: Two times a day (BID) | ORAL | 0 refills | Status: DC

## 2017-02-26 ENCOUNTER — Other Ambulatory Visit: Payer: Medicaid Other

## 2017-02-26 ENCOUNTER — Inpatient Hospital Stay: Payer: Medicaid Other

## 2017-02-26 DIAGNOSIS — R740 Nonspecific elevation of levels of transaminase and lactic acid dehydrogenase [LDH]: Secondary | ICD-10-CM | POA: Diagnosis not present

## 2017-02-26 DIAGNOSIS — C9010 Plasma cell leukemia not having achieved remission: Secondary | ICD-10-CM

## 2017-02-26 LAB — COMPREHENSIVE METABOLIC PANEL
ALBUMIN: 2.8 g/dL — AB (ref 3.5–5.0)
ALK PHOS: 215 U/L — AB (ref 40–150)
ALT: 490 U/L (ref 0–55)
AST: 2003 U/L — AB (ref 5–34)
Anion gap: 12 — ABNORMAL HIGH (ref 3–11)
BILIRUBIN TOTAL: 11.2 mg/dL — AB (ref 0.2–1.2)
BUN: 4 mg/dL — AB (ref 7–26)
CO2: 19 mmol/L — ABNORMAL LOW (ref 22–29)
CREATININE: 0.62 mg/dL (ref 0.60–1.10)
Calcium: 8.1 mg/dL — ABNORMAL LOW (ref 8.4–10.4)
Chloride: 109 mmol/L (ref 98–109)
GFR calc Af Amer: >60 mL/min (ref >60)
GFR calc non Af Amer: >60 mL/min (ref >60)
GLUCOSE: 75 mg/dL (ref 70–140)
Potassium: 3.5 mmol/L (ref 3.3–4.7)
Sodium: 140 mmol/L (ref 136–145)
TOTAL PROTEIN: 5.2 g/dL — AB (ref 6.4–8.3)

## 2017-02-26 LAB — CBC WITH DIFFERENTIAL/PLATELET
BASOS PCT: 0 %
Basophils Absolute: 0 10*3/uL (ref 0.0–0.1)
EOS ABS: 0 10*3/uL (ref 0.0–0.5)
EOS PCT: 0 %
HEMATOCRIT: 35.8 % (ref 34.8–46.6)
Hemoglobin: 11.7 g/dL (ref 11.6–15.9)
Lymphocytes Relative: 15 %
Lymphs Abs: 1.2 10*3/uL (ref 0.9–3.3)
MCH: 26.1 pg (ref 25.1–34.0)
MCHC: 32.7 g/dL (ref 31.5–36.0)
MCV: 79.7 fL (ref 79.5–101.0)
MONO ABS: 1.1 10*3/uL — AB (ref 0.1–0.9)
MONOS PCT: 13 %
Neutro Abs: 5.6 10*3/uL (ref 1.5–6.5)
Neutrophils Relative %: 72 %
PLATELETS: 131 10*3/uL — AB (ref 145–400)
RBC: 4.49 MIL/uL (ref 3.70–5.45)
RDW: 20.5 % — AB (ref 11.2–16.1)
WBC: 7.9 10*3/uL (ref 3.9–10.3)

## 2017-02-27 ENCOUNTER — Encounter: Payer: Self-pay | Admitting: Hematology

## 2017-02-27 NOTE — Progress Notes (Signed)
Called patient's daughter to advise that the PA is pending for Ursodiol and the pharmacy will contact them if it gets approved for pick up.

## 2017-02-27 NOTE — Progress Notes (Signed)
Received PA request for Ursodiol 300 mg capsule.  Asked physician reason for medication and she gave me a diagnosis.  Called [facility]([place]) to initiate PA. Answered questions.  She states it has to be sent over to pharmacist for review and has a 24 hour turn around time. RV#[id number].

## 2017-03-02 ENCOUNTER — Ambulatory Visit: Payer: Medicaid Other | Admitting: Hematology

## 2017-03-02 ENCOUNTER — Inpatient Hospital Stay (HOSPITAL_BASED_OUTPATIENT_CLINIC_OR_DEPARTMENT_OTHER): Payer: Medicaid Other | Admitting: Nurse Practitioner

## 2017-03-02 ENCOUNTER — Encounter: Payer: Self-pay | Admitting: Nurse Practitioner

## 2017-03-02 ENCOUNTER — Inpatient Hospital Stay: Payer: Medicaid Other

## 2017-03-02 ENCOUNTER — Telehealth: Payer: Self-pay | Admitting: Nurse Practitioner

## 2017-03-02 ENCOUNTER — Other Ambulatory Visit: Payer: Medicaid Other

## 2017-03-02 ENCOUNTER — Ambulatory Visit: Payer: Medicaid Other

## 2017-03-02 ENCOUNTER — Encounter: Payer: Self-pay | Admitting: Hematology

## 2017-03-02 VITALS — BP 119/83 | HR 92 | Temp 98.3°F | Resp 20 | Ht 63.0 in | Wt 133.3 lb

## 2017-03-02 DIAGNOSIS — C9010 Plasma cell leukemia not having achieved remission: Secondary | ICD-10-CM

## 2017-03-02 DIAGNOSIS — E099 Drug or chemical induced diabetes mellitus without complications: Secondary | ICD-10-CM | POA: Diagnosis not present

## 2017-03-02 DIAGNOSIS — R740 Nonspecific elevation of levels of transaminase and lactic acid dehydrogenase [LDH]: Secondary | ICD-10-CM | POA: Diagnosis not present

## 2017-03-02 DIAGNOSIS — B181 Chronic viral hepatitis B without delta-agent: Secondary | ICD-10-CM

## 2017-03-02 DIAGNOSIS — R945 Abnormal results of liver function studies: Secondary | ICD-10-CM

## 2017-03-02 DIAGNOSIS — Z95828 Presence of other vascular implants and grafts: Secondary | ICD-10-CM

## 2017-03-02 DIAGNOSIS — R7989 Other specified abnormal findings of blood chemistry: Secondary | ICD-10-CM

## 2017-03-02 DIAGNOSIS — T380X5A Adverse effect of glucocorticoids and synthetic analogues, initial encounter: Secondary | ICD-10-CM | POA: Diagnosis not present

## 2017-03-02 DIAGNOSIS — R05 Cough: Secondary | ICD-10-CM

## 2017-03-02 DIAGNOSIS — E876 Hypokalemia: Secondary | ICD-10-CM | POA: Diagnosis not present

## 2017-03-02 LAB — CBC WITH DIFFERENTIAL/PLATELET
BASOS ABS: 0 10*3/uL (ref 0.0–0.1)
Basophils Relative: 0 %
EOS PCT: 0 %
Eosinophils Absolute: 0 10*3/uL (ref 0.0–0.5)
HCT: 31.6 % — ABNORMAL LOW (ref 34.8–46.6)
HEMOGLOBIN: 10.4 g/dL — AB (ref 11.6–15.9)
LYMPHS PCT: 13 %
Lymphs Abs: 1.2 10*3/uL (ref 0.9–3.3)
MCH: 26.8 pg (ref 25.1–34.0)
MCHC: 32.9 g/dL (ref 31.5–36.0)
MCV: 81.4 fL (ref 79.5–101.0)
Monocytes Absolute: 0.8 10*3/uL (ref 0.1–0.9)
Monocytes Relative: 9 %
NEUTROS ABS: 7 10*3/uL — AB (ref 1.5–6.5)
NEUTROS PCT: 78 %
PLATELETS: 98 10*3/uL — AB (ref 145–400)
RBC: 3.88 MIL/uL (ref 3.70–5.45)
RDW: 23.5 % — ABNORMAL HIGH (ref 11.2–16.1)
WBC: 9 10*3/uL (ref 3.9–10.3)

## 2017-03-02 LAB — COMPREHENSIVE METABOLIC PANEL
ALK PHOS: 174 U/L — AB (ref 40–150)
ALT: 246 U/L — AB (ref 0–55)
AST: 1124 U/L — AB (ref 5–34)
Albumin: 2.4 g/dL — ABNORMAL LOW (ref 3.5–5.0)
Anion gap: 8 (ref 3–11)
BUN: 6 mg/dL — AB (ref 7–26)
CHLORIDE: 104 mmol/L (ref 98–109)
CO2: 23 mmol/L (ref 22–29)
CREATININE: 0.68 mg/dL (ref 0.60–1.10)
Calcium: 7.6 mg/dL — ABNORMAL LOW (ref 8.4–10.4)
GFR calc Af Amer: >60 mL/min (ref >60)
Glucose, Bld: 185 mg/dL — ABNORMAL HIGH (ref 70–140)
Potassium: 2.9 mmol/L — CL (ref 3.3–4.7)
Sodium: 135 mmol/L — ABNORMAL LOW (ref 136–145)
Total Bilirubin: 11.5 mg/dL (ref 0.2–1.2)
Total Protein: 4.5 g/dL — ABNORMAL LOW (ref 6.4–8.3)

## 2017-03-02 MED ORDER — SODIUM CHLORIDE 0.9 % IV SOLN
Freq: Once | INTRAVENOUS | Status: AC
  Administered 2017-03-02: 13:00:00 via INTRAVENOUS
  Filled 2017-03-02: qty 1000

## 2017-03-02 MED ORDER — HEPARIN SOD (PORK) LOCK FLUSH 100 UNIT/ML IV SOLN
500.0000 [IU] | Freq: Once | INTRAVENOUS | Status: AC | PRN
  Administered 2017-03-02: 500 [IU]
  Filled 2017-03-02: qty 5

## 2017-03-02 MED ORDER — SODIUM CHLORIDE 0.9% FLUSH
10.0000 mL | INTRAVENOUS | Status: DC | PRN
  Administered 2017-03-02: 10 mL
  Filled 2017-03-02: qty 10

## 2017-03-02 NOTE — Progress Notes (Signed)
Called [facility]([place]) to follow up on status of PA for Ursodiol.  PA approved 02/27/17-02/22/18 ID# [id number].  Called [facility] pharmacy([place]ann) to advise of approval. She states it did go through.

## 2017-03-02 NOTE — Progress Notes (Addendum)
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      10/10/2014 Miscellaneous    Peripheral blood chemistry and leukocytosis with total white count 78K, comprised of large plasma cells and his normocytic anemia. There is a myeloid left shift with previous surgical radium blasts. Flow cytometry showed 64% plasma cells      10/10/2014 Bone Marrow Biopsy    Markedly hypercellular marrow (95%), Atypical plasma cells comprise 57% of the cellularity. There was diminished multilineage in hematopoiesis with adequate maturation. Breasts less than 1%), no overt dysplasia of the myeloid or erythroid lineages.       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      05/07/2015 - 05/12/2015 Hospital Admission    [facility], [facility] ID workup was negative,  EGD showed evidence of gastritis and duodenitis, no H. pylori or CMV.      05/20/2015 - 05/24/2015 Hospital Admission    [facility] from Escherichia coli UTI.      07/11/2015 Bone Marrow Biopsy    Post transplant 100 a bone marrow biopsy showed hypocellular marrow, 20%, no increase in plasma cells (2%) or other abnormalities.      08/22/2015 - 01/02/2016 Chemotherapy    MaintenanceCyborD (cytoxan 3051mm2 iv, bortezomib 1.5 mg/m, dexamethasone 40 mg, every 2 weeks, changed to Velcade maintenance after 4 months treatment      01/16/2016 - 04/19/2016 Chemotherapy    Maintenance Velcade 1.3 mg/m every 2 weeks      02/22/2016 Pathology Results    BONE MARROW: Diagnosis Bone Marrow, Aspirate,Biopsy, and Clot, right iliac - NORMOCELLULAR BONE MARROW FOR AGE WITH TRILINEAGE HEMATOPOIESIS. - PLASMACYTOSIS (PLASMA CELLS 12%). - SEE COMMENT. PERIPHERAL BLOOD: - OCCASIONAL CIRCULATING PLASMA CELLS.      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 Velcade 1.70m54m2 and dexa 40m29mekly 2. Daratumumab weekly started on 04/18/2016, changed to every 2 weeks on 06/25/2016 and changed to every 4 weeks after 10/13/2016, per protocol  3. zometa every 4 weeks started on 11/27/2015, plan  for 2 years   INTERVAL HISTORY: Sheryl [name] returns for follow up as scheduled. She continues to report significant fatigue and decreased appetite, eating and drinking very little. She has intermittent nausea, managed with phenergan PRN. Has 1 semi-formed BM daily x1 week, previously she was constipated. Denies abdominal pain, pressure, bloating, or emesis. She has intermittent  productive cough with yellow sputum, intermittent chills without fever; no dyspnea or chest pain, sore throat or congestion. Cough is improved from last week and feels her fatigue slightly better today than yesterday.   REVIEW OF SYSTEMS:   Constitutional: Denies fevers or abnormal weight loss(+) significant fatigue (+) intermittent chills (+) no appetite  Eyes: Denies blurriness of vision Ears, nose, mouth, throat, and face: Denies mucositis, sore throat, or congestion  Respiratory: Denies dyspnea or wheezes (+) productive cough, yellow/white sputum, improving  Cardiovascular: Denies palpitation, chest discomfort or lower extremity swelling Gastrointestinal:  Denies nausea, heartburn or change in bowel habits Skin: Denies abnormal skin rashes Lymphatics: Denies new lymphadenopathy or easy bruising Neurological:Denies numbness, tingling or new weaknesses Behavioral/Psych: Mood is stable, no new changes  All other systems were reviewed with the patient and are negative.  MEDICAL HISTORY:  Past Medical History:  Diagnosis Date  . Chills with fever    intermittently since d/c from hospital  . Dysuria-frequency syndrome    w/ pink urine  . GERD (gastroesophageal reflux disease)   . Hepatitis   . History of positive PPD    DX 2011--  CXR DONE NO EVIDENCE  . History of ureter stent   . Hydronephrosis, right   . Neuromuscular disorder (H[facility])    legs numb intermittently  . 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 Pneumonia   . Right ureteral stone   . Urosepsis 8/14   admitted to [facility]    SURGICAL HISTORY: Past Surgical History:  Procedure Laterality Date  . CYSTOSCOPY W/ URETERAL STENT PLACEMENT Right 09/25/2012   Procedure: CYSTOSCOPY WITH RETROGRADE PYELOGRAM/URETERAL STENT PLACEMENT;  Surgeon: Alexis Frock, MD;  Location: [facility];  Service: Urology;  Laterality: Right;  . CYSTOSCOPY WITH RETROGRADE PYELOGRAM, URETEROSCOPY AND STENT PLACEMENT Right 10/15/2012   Procedure: CYSTOSCOPY WITH  RETROGRADE PYELOGRAM, URETEROSCOPY AND REMOVAL STENT WITH  STENT PLACEMENT;  Surgeon: Alexis Frock, MD;  Location: [facility];  Service: Urology;  Laterality: Right;  . ESOPHAGOGASTRODUODENOSCOPY (EGD) WITH PROPOFOL N/A 11/16/2014   Procedure: ESOPHAGOGASTRODUODENOSCOPY (EGD) WITH PROPOFOL;  Surgeon: Milus Banister, MD;  Location: [facility];  Service: Endoscopy;  Laterality: N/A;  . HOLMIUM LASER APPLICATION Right 4/85/4627   Procedure: HOLMIUM LASER APPLICATION;  Surgeon: Alexis Frock, MD;  Location: [facility];  Service: Urology;  Laterality: Right;  . LIVER BIOPSY    . OTHER SURGICAL HISTORY Right    removal of ovarian cyst  . removal of uterine cyst     years ago  . RIGHT VATS W/ DRAINAGE PEURAL EFFUSION AND BX'S  10-30-2008    I have reviewed the social history and family history with the patient and they are unchanged from previous note.  ALLERGIES:  has No Known Allergies.  MEDICATIONS:  Current Outpatient Medications  Medication Sig Dispense Refill  . acyclovir (ZOVIRAX) 800 MG tablet Take 1 tablet (800 mg total) by mouth 2 (two) times daily. 60 tablet 2  . dexamethasone (DECADRON) 4 MG tablet Take 10 tablets (40 mg total) by mouth once a week. 40 tablet 2  . feeding supplement, ENSURE ENLIVE, (ENSURE ENLIVE) LIQD Take 237 mLs by  mouth 2 (two) times daily between meals. 237 mL 12  . metFORMIN (GLUCOPHAGE) 500 MG tablet Take 1 tablet (500 mg total) by mouth 2 (two) times daily with a meal. 60 tablet 2  . omeprazole (PRILOSEC) 20 MG capsule Take 1 capsule (20 mg total) by mouth daily. (Patient taking differently: Take 20 mg by mouth 2 (two) times daily before a meal. ) 30 capsule 5  . ondansetron (ZOFRAN) 8 MG tablet Take 1 tablet (8 mg total) by mouth every 8 (eight) hours as needed for nausea or vomiting. 30 tablet 1  . polyethylene glycol (MIRALAX / GLYCOLAX) packet Take 17 g by mouth daily as needed (constipation).     . prochlorperazine  (COMPAZINE) 10 MG tablet Take 1 tablet (10 mg total) by mouth every 6 (six) hours as needed for nausea or vomiting. 30 tablet 0  . promethazine (PHENERGAN) 25 MG tablet Take 1 tablet (25 mg total) by mouth every 6 (six) hours as needed for nausea or vomiting. 30 tablet 0  . tenofovir (VIREAD) 300 MG tablet Take 1 tablet (300 mg total) by mouth daily. 30 tablet 6  . ursodiol (ACTIGALL) 300 MG capsule Take 1 capsule (300 mg total) by mouth 2 (two) times daily. 60 capsule 0   No current facility-administered medications for this visit.    Facility-Administered Medications Ordered in Other Visits  Medication Dose Route Frequency Provider Last Rate Last Dose  . sodium chloride 0.9 % injection 10 mL  10 mL Intravenous PRN Truitt Merle, MD   10 mL at 12/11/15 1532  . sodium chloride 0.9 % injection 10 mL  10 mL Intravenous PRN Truitt Merle, MD   10 mL at 06/11/16 1505  . sodium chloride flush (NS) 0.9 % injection 10 mL  10 mL Intravenous PRN Truitt Merle, MD   10 mL at 10/09/15 1717    PHYSICAL EXAMINATION: ECOG PERFORMANCE STATUS: 3 - Symptomatic, >50% confined to bed  Vitals:   03/02/17 1126  BP: 119/83  Pulse: 92  Resp: 20  Temp: 98.3 F (36.8 C)  SpO2: 100%   Filed Weights   03/02/17 1126  Weight: 133 lb 4.8 oz (60.5 kg)    GENERAL:alert, not in distress  SKIN: skin texture, turgor are normal, no rashes or significant lesions (+) juandice EYES: normal, Conjunctiva are pink and non-injected, (+) scleral icterus  OROPHARYNX:no exudate, no erythema and lips, buccal mucosa, and tongue normal  NECK: supple, thyroid normal size, non-tender, without nodularity LYMPH:  no palpable cervical, supraclavicular, or axillary lymphadenopathy  LUNGS: clear to auscultation with normal breathing effort (+) inspiratory rales HEART: regular rate & rhythm and no murmurs and no lower extremity edema ABDOMEN:abdomen soft, normal bowel sounds. (+) mild RUQ tenderness. No hepatomegaly Musculoskeletal:no cyanosis  of digits and no clubbing  NEURO: alert & oriented x 3 with fluent speech, no focal motor/sensory deficits PAC without erythema  LABORATORY DATA:  I have reviewed the data as listed CBC Latest Ref Rng & Units 03/02/2017 02/26/2017 02/23/2017  WBC 3.9 - 10.3 K/uL 9.0 7.9 4.7  Hemoglobin 11.6 - 15.9 g/dL 10.4(L) 11.7 9.5(L)  Hematocrit 34.8 - 46.6 % 31.6(L) 35.8 30.0(L)  Platelets 145 - 400 K/uL 98(L) 131(L) 138(L)     CMP Latest Ref Rng & Units 03/02/2017 02/26/2017 02/23/2017  Glucose 70 - 140 mg/dL 185(H) 75 144(H)  BUN 7 - 26 mg/dL 6(L) 4(L) 8  Creatinine 0.60 - 1.10 mg/dL 0.68 0.62 0.60  Sodium 136 - 145 mmol/L 135(L) 140 140  Potassium 3.3 - 4.7 mmol/L 2.9(LL) 3.5 3.4  Chloride 98 - 109 mmol/L 104 109 112(H)  CO2 22 - 29 mmol/L 23 19(L) 21(L)  Calcium 8.4 - 10.4 mg/dL 7.6(L) 8.1(L) 7.7(L)  Total Protein 6.4 - 8.3 g/dL 4.5(L) 5.2(L) 4.5(L)  Total Bilirubin 0.2 - 1.2 mg/dL 11.5(HH) 11.2(HH) 7.2(HH)  Alkaline Phos 40 - 150 U/L 174(H) 215(H) 210(H)  AST 5 - 34 U/L 1,124(HH) 2,003(HH) 2,017(HH)  ALT 0 - 55 U/L 246(H) 490(HH) 570(HH)   SPEP M-protein  09/15/2014: 4.2 10/08/14: 4.6 12/08/2014: 0.2 02/15/2015: not sufficient sample for test   09/11/2015: not observed 10/09/2015: not det   11/06/2015: not det  12/25/2015: not det  02/13/2016: 0.3 03/12/2016: 0.8 04/09/2016: 0.6 05/08/16: 0.1 06/04/16: 0.1 07/02/2016: 0.2 07/09/2016: 0.2 (dara specific IFE negative) 08/05/16: Not observed  09/01/16: Not observed 09/29/16: 0.1(dara specific IFE negative) 10/27/16: Not observed 11/24/16 : Not observed 12/23/16: 0.1 (Dara specific IFE showed IgG monoclonal protein with lambda light chain) 01/21/2017: 0.1 (Dara specific IFE showed IgG monoclonal protein with lambda light chain)   IgG mg/dl 11/03/2014: 3150  12/08/2014:  759 02/14/2014: 860 09/11/2015: 7169 10/09/2015: 1457 11/06/2015: 1504 12/25/2015: 1400 02/13/2016: 1543 03/12/2016: 1860 04/09/2016: 1620 05/08/16: 749 06/04/16: 751 5/30/20187:  796 07/09/2016: 701 08/05/16: 678 09/01/16: 650 09/29/16: 727 10/27/16: 634 11/24/16: 685  12/23/16: 714 01/21/2017: 709   Kappa/lambda light chains levels and ration  11/03/14: 0.75, 152, 0.00 12/22/2014: 1.10, 2.60, 0.42 02/15/2015: 9.59,14.35, 0.67 09/11/2015: 2.44, 2.72, 0.90 10/09/2015: 3.09, 3.49, 0.89 11/06/2015: 2.30, 2.62, 0.88 12/25/2015: 2.51, 3.0, 0.84 02/13/2016: 2.64, 15.3, 0.17 03/12/2016: 2.27, 45.5, 0.05 04/09/2016: 3.33, 45.5, 0.07 05/08/2016: 0.49, 1.21, 0.40 06/04/2016: 0.83, 1.11, 0.75 07/02/16: 0.51, 1.43, 0.36 07/23/16: 0.68, 1.02, 0.67 08/05/16: 0.62, 1.09, 0.57 09/01/16: 0.80, 1.21, 0.66 09/29/16: 0.44, 1.07, 0.41 10/27/16: 0.63, 1.44, 0.44 11/24/16: 0.83, 2.13, 0.39 12/23/16: 0.69, 2.31, 0.30 01/21/2017: 7.7, 30.8, 0.25  24 h urine UPEP/IFE and light chain: 11/03/2014: IFE showed a monoclonal IgG heavy chain with associated lambda light chain. M protein was Undetectable    PATHOLOGY   PATHOLOGY REPORT  Bone Marrow (BM) and Peripheral Blood (PB) FINAL PATHOLOGIC DIAGNOSIS  BONE MARROW: 07/11/2015 Hypocellular marrow (20%) with no increase in plasma cells (2%). See comment. PERIPHERAL BLOOD: Mild anemia. No circulating plasma cells identified. See comment and CBC data.  BONE MARROW: 02/22/2016 Diagnosis Bone Marrow, Aspirate,Biopsy, and Clot, right iliac - NORMOCELLULAR BONE MARROW FOR AGE WITH TRILINEAGE HEMATOPOIESIS. - PLASMACYTOSIS (PLASMA CELLS 12%). - SEE COMMENT. PERIPHERAL BLOOD: - OCCASIONAL CIRCULATING PLASMA CELLS.   PROCEDURES  Bone Marrow Biopsy, 01/21/17 Bone Marrow, Aspirate, Biopsy, and Clot - NORMOCELLULAR BONE MARROW FOR AGE WITH TRILINEAGE HEMATOPOIESIS AND 2% PLASMA CELLS. PERIPHERAL BLOOD: - NORMOCYTIC-HYPOCHROMIC ANEMIA.   Colonoscopy by Dr. Ardis Hughs on 11/26/16  IMPRESSION - One 2 mm polyp in the transverse colon, removed with a cold biopsy forceps. Resected and retrieved. - Small internal and external hemorrhoids. -  The examination was otherwise normal on direct and retroflexion views.   RADIOGRAPHIC STUDIES: I have personally reviewed the radiological images as listed and agreed with the findings in the report. No results found.   ASSESSMENT & PLAN: 47 y.o. [place] woman, presented with anemia and leokocytosis    1.  Transaminitis and hyperbilirubinemia, secondary to hepatitis B flare -She continues tenofovir per Dr. Baxter Flattery, patient has not f/u with ID yet as outpatient; will ask my scheduler to arrange f/u this week.  -AST, ALT, ALK PHOS are down trending, tBili is stable at 11.5; she  has clinical juandice -Dr. Burr Medico previously discussed with GI, consensus is that she does not need MRCP.  -Ursodiol is pending insurance approval, has not started yet -She will see NP at Dr. Storm Frisk office 1/29; will follow closely   2. 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 Tiana Loft at [facility] -Velcade/Dex and daratumumab currently on hold due to #1  3. Type 2 DM, steroid induced -BG 185 today; she is not currently on decadron as chemotherapy is on hold  -Will monitor closely  4. Hypokalemia -Suspect due to her semi-formed stool output and decreased po intake -She will get 1 L NS + 20 K IV fluids today -I encouraged her to eat more   5. Cough -Recently completed Levaquin; cough is improving -She is afebrile  -Will monitor closely   PLAN -Continue Tenofovir for Hep B flare -F/u with Dr. Storm Frisk office this week -1L NS + 20 K today for hypokalemia  -Ursodiol pending insurance approval   All questions were answered. The patient knows to call the clinic with any problems, questions or concerns. No barriers to learning was detected. I spent 25 minutes counseling the patient face to face. The total time spent in the appointment was 30 minutes and more than 50% was on counseling, review of test results, and coordination of care.     Alla Feeling, NP 03/03/17    Addendum  I have seen the patient, examined her. I agree with the assessment and and plan and have edited the notes.   Sheryl [name] is still quite symptomatic from her acute Heptitis B flare, her lab showed slightly improved liver enzymes, but she has persistent hyperbilirubinemia. Will refer her to ID Dr. Baxter Flattery. We will continue holding her plasma cell leukemia treatment until her LFTs improves significantly, will continue monitoring her disease. Will see her back in 2 weeks   Truitt Merle  03/02/2017

## 2017-03-02 NOTE — Telephone Encounter (Signed)
Scheduled appt per 1/28 los - Left message for Dr. Baxter Flattery office to schedule appt for f/u .

## 2017-03-02 NOTE — Patient Instructions (Signed)

## 2017-03-02 NOTE — Patient Instructions (Signed)
Implanted Port Home Guide An implanted port is a type of central line that is placed under the skin. Central lines are used to provide IV access when treatment or nutrition needs to be given through a person's veins. Implanted ports are used for long-term IV access. An implanted port may be placed because:  You need IV medicine that would be irritating to the small veins in your hands or arms.  You need long-term IV medicines, such as antibiotics.  You need IV nutrition for a long period.  You need frequent blood draws for lab tests.  You need dialysis.  Implanted ports are usually placed in the chest area, but they can also be placed in the upper arm, the abdomen, or the leg. An implanted port has two main parts:  Reservoir. The reservoir is round and will appear as a small, raised area under your skin. The reservoir is the part where a needle is inserted to give medicines or draw blood.  Catheter. The catheter is a thin, flexible tube that extends from the reservoir. The catheter is placed into a large vein. Medicine that is inserted into the reservoir goes into the catheter and then into the vein.  How will I care for my incision site? Do not get the incision site wet. Bathe or shower as directed by your health care provider. How is my port accessed? Special steps must be taken to access the port:  Before the port is accessed, a numbing cream can be placed on the skin. This helps numb the skin over the port site.  Your health care provider uses a sterile technique to access the port. ? Your health care provider must put on a mask and sterile gloves. ? The skin over your port is cleaned carefully with an antiseptic and allowed to dry. ? The port is gently pinched between sterile gloves, and a needle is inserted into the port.  Only "non-coring" port needles should be used to access the port. Once the port is accessed, a blood return should be checked. This helps ensure that the port  is in the vein and is not clogged.  If your port needs to remain accessed for a constant infusion, a clear (transparent) bandage will be placed over the needle site. The bandage and needle will need to be changed every week, or as directed by your health care provider.  Keep the bandage covering the needle clean and dry. Do not get it wet. Follow your health care provider's instructions on how to take a shower or bath while the port is accessed.  If your port does not need to stay accessed, no bandage is needed over the port.  What is flushing? Flushing helps keep the port from getting clogged. Follow your health care provider's instructions on how and when to flush the port. Ports are usually flushed with saline solution or a medicine called heparin. The need for flushing will depend on how the port is used.  If the port is used for intermittent medicines or blood draws, the port will need to be flushed: ? After medicines have been given. ? After blood has been drawn. ? As part of routine maintenance.  If a constant infusion is running, the port may not need to be flushed.  How long will my port stay implanted? The port can stay in for as long as your health care provider thinks it is needed. When it is time for the port to come out, surgery will be   done to remove it. The procedure is similar to the one performed when the port was put in. When should I seek immediate medical care? When you have an implanted port, you should seek immediate medical care if:  You notice a bad smell coming from the incision site.  You have swelling, redness, or drainage at the incision site.  You have more swelling or pain at the port site or the surrounding area.  You have a fever that is not controlled with medicine.  This information is not intended to replace advice given to you by your health care provider. Make sure you discuss any questions you have with your health care provider. Document  Released: 01/20/2005 Document Revised: 06/28/2015 Document Reviewed: 09/27/2012 Elsevier Interactive Patient Education  2017 [employer]  

## 2017-03-03 ENCOUNTER — Encounter: Payer: Self-pay | Admitting: Nurse Practitioner

## 2017-03-03 ENCOUNTER — Ambulatory Visit (INDEPENDENT_AMBULATORY_CARE_PROVIDER_SITE_OTHER): Payer: Medicaid Other | Admitting: Family

## 2017-03-03 ENCOUNTER — Encounter: Payer: Self-pay | Admitting: Family

## 2017-03-03 VITALS — BP 109/75 | HR 78 | Temp 98.9°F | Wt 132.8 lb

## 2017-03-03 DIAGNOSIS — C9012 Plasma cell leukemia in relapse: Secondary | ICD-10-CM | POA: Diagnosis not present

## 2017-03-03 DIAGNOSIS — B181 Chronic viral hepatitis B without delta-agent: Secondary | ICD-10-CM

## 2017-03-03 DIAGNOSIS — E876 Hypokalemia: Secondary | ICD-10-CM

## 2017-03-03 NOTE — Assessment & Plan Note (Signed)
Hypokalemia noted on previous blood work with 1 L of fluid + 20K. Will recheck potassium today.

## 2017-03-03 NOTE — Patient Instructions (Addendum)
Please continue to take your tenofovir as prescribed.  Please continue to work on increasing your nutritional intake. Consider taking a Boost or Ensure.  Continue to take your other medications as prescribed.  Plan to follow up with Dr. Baxter Flattery in 3 weeks or sooner if needed

## 2017-03-03 NOTE — Progress Notes (Signed)
Subjective:    Patient ID: Sheryl [name], female    DOB: October 11, 1970, 47 y.o.   MRN: [id number]  Chief Complaint  Patient presents with  . Hepatitis B  . Leukemia    HPI:  Sheryl [name] is a 47 y.o. female who presents today for follow up of Hepatitis B flare.   Sheryl [name] was recently evaluated in the ED and admitted to the hospital with transaminitis and Hepatitis B flare. She is known to have Hepatitis B E Ag negative/chronic HBV first diagnosed in 2003. She was started on and maintained on entecavir in September of 2016 through June of 2018 when she lost her insurance. Restarted taking entecavir in December 2018. She was off of the enecavir for 1-2 weeks and was having fatigue, poor appetite, dehydration and worsening transaminitis. She was started on Tenofovir 300 mg daily. She had a right upper quadrant ultrasound which showed a 1.4 x 1.0 x 1.1 cm cyst which was noted on previous scans. AST peaked in the hospital at 3477 and has been trending downward with the most recent being 1124. Her AST also continues to improve now at 246. Her Bilirubin has increased to 11.5 in the last 2 weeks. No updated INR were available. Also of note her WBC count has increased to 9.0 on most recent evaluation.  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 She continued to feel fatigue and decreased appetite with decreased oral intake. Overall she reported feeling slightly improved. GI did not believe that MRCP is needed at this time and her Ursodiol is pending insurance approval. Her hypokalemia was treated with 1 L normal saline + 20 K.   Continues to have a decreased appetite and oral intake. Describes eating a bowel of rice as she is able to. Daughter indicates that she does not really like the Boost/Ensure because they make her nauseous at times. She continues to have fatigue as well. The nausea medications have  helped with her symptoms. Denies any fevers or chills. She reports taking the tenofovir as prescribed and denies adverse side effects. Continues to have 1 bowel movement per day. No nusicasnce or other bleeding. Denies any abdominal pain.    No Known Allergies    Outpatient Medications Prior to Visit  Medication Sig Dispense Refill  . acyclovir (ZOVIRAX) 800 MG tablet Take 1 tablet (800 mg total) by mouth 2 (two) times daily. 60 tablet 2  . dexamethasone (DECADRON) 4 MG tablet Take 10 tablets (40 mg total) by mouth once a week. 40 tablet 2  . feeding supplement, ENSURE ENLIVE, (ENSURE ENLIVE) LIQD Take 237 mLs by mouth 2 (two) times daily between meals. 237 mL 12  . metFORMIN (GLUCOPHAGE) 500 MG tablet Take 1 tablet (500 mg total) by mouth 2 (two) times daily with a meal. 60 tablet 2  . omeprazole (PRILOSEC) 20 MG capsule Take 1 capsule (20 mg total) by mouth daily. (Patient taking differently: Take 20 mg by mouth 2 (two) times daily before a meal. ) 30 capsule 5  . ondansetron (ZOFRAN) 8 MG tablet Take 1 tablet (8 mg total) by mouth every 8 (eight) hours as needed for nausea or vomiting. 30 tablet 1  . polyethylene glycol (MIRALAX / GLYCOLAX) packet Take 17 g by mouth daily as needed (constipation).     . prochlorperazine (COMPAZINE) 10 MG tablet Take 1 tablet (10 mg total) by mouth every 6 (  six) hours as needed for nausea or vomiting. 30 tablet 0  . promethazine (PHENERGAN) 25 MG tablet Take 1 tablet (25 mg total) by mouth every 6 (six) hours as needed for nausea or vomiting. 30 tablet 0  . tenofovir (VIREAD) 300 MG tablet Take 1 tablet (300 mg total) by mouth daily. 30 tablet 6  . ursodiol (ACTIGALL) 300 MG capsule Take 1 capsule (300 mg total) by mouth 2 (two) times daily. 60 capsule 0   Facility-Administered Medications Prior to Visit  Medication Dose Route Frequency Provider Last Rate Last Dose  . sodium chloride 0.9 % injection 10 mL  10 mL Intravenous PRN Truitt Merle, MD   10 mL at  12/11/15 1532  . sodium chloride 0.9 % injection 10 mL  10 mL Intravenous PRN Truitt Merle, MD   10 mL at 06/11/16 1505  . sodium chloride flush (NS) 0.9 % injection 10 mL  10 mL Intravenous PRN Truitt Merle, MD   10 mL at 10/09/15 1717     Past Medical History:  Diagnosis Date  . Chills with fever    intermittently since d/c from hospital  . Dysuria-frequency syndrome    w/ pink urine  . GERD (gastroesophageal reflux disease)   . Hepatitis   . History of positive PPD    DX 2011--  CXR DONE NO EVIDENCE  . History of ureter stent   . Hydronephrosis, right   . Neuromuscular disorder (HCC)    legs numb intermittently  . 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 Pneumonia   . Right ureteral stone   . Urosepsis 8/14   admitted to [facility]      Past Surgical History:  Procedure Laterality Date  . CYSTOSCOPY W/ URETERAL STENT PLACEMENT Right 09/25/2012   Procedure: CYSTOSCOPY WITH RETROGRADE PYELOGRAM/URETERAL STENT PLACEMENT;  Surgeon: Alexis Frock, MD;  Location: [facility];  Service: Urology;  Laterality: Right;  . CYSTOSCOPY WITH RETROGRADE PYELOGRAM, URETEROSCOPY AND STENT PLACEMENT Right 10/15/2012   Procedure: CYSTOSCOPY WITH RETROGRADE PYELOGRAM, URETEROSCOPY AND REMOVAL STENT WITH  STENT PLACEMENT;  Surgeon: Alexis Frock, MD;  Location: [facility];  Service: Urology;  Laterality: Right;  . ESOPHAGOGASTRODUODENOSCOPY (EGD) WITH PROPOFOL N/A 11/16/2014   Procedure: ESOPHAGOGASTRODUODENOSCOPY (EGD) WITH PROPOFOL;  Surgeon: Milus Banister, MD;  Location: [facility];  Service: Endoscopy;  Laterality: N/A;  . HOLMIUM LASER APPLICATION Right 1/44/8185   Procedure: HOLMIUM LASER APPLICATION;  Surgeon: Alexis Frock, MD;  Location: [facility];  Service: Urology;  Laterality: Right;  . LIVER BIOPSY    . OTHER SURGICAL HISTORY Right    removal of ovarian cyst  . removal of uterine cyst     years ago  . RIGHT VATS W/ DRAINAGE PEURAL EFFUSION AND BX'S  10-30-2008       Family History  Problem Relation Age of Onset  . Stomach cancer Mother   . Lung disease Father   . Asthma Father       Social History   Socioeconomic History  . Marital status: Single    Spouse name: Not on file  . Number of children: 3  . Years of education: Not on file  . Highest education level: Not on file  Social Needs  . Financial resource strain: Not on file  . Food insecurity - worry: Not on file  . Food insecurity - inability: Not on file  . Transportation needs - medical: Not on file  . Transportation needs - non-medical: Not on file  Occupational  History  . Not on file  Tobacco Use  . Smoking status: Never Smoker  . Smokeless tobacco: Never Used  Substance and Sexual Activity  . Alcohol use: No    Alcohol/week: 0.0 oz  . Drug use: No  . Sexual activity: Not Currently    Birth control/protection: Abstinence  Other Topics Concern  . Not on file  Social History Narrative  . Not on file      Review of Systems  Constitutional: Positive for appetite change (continues to be decreased) and fatigue. Negative for chills and fever.  Respiratory: Negative for cough, chest tightness and shortness of breath.   Cardiovascular: Negative for chest pain and leg swelling.  Gastrointestinal: Positive for nausea and vomiting. Negative for abdominal distention, abdominal pain, blood in stool, constipation and diarrhea.       Objective:    BP 109/75   Pulse 78   Temp 98.9 F (37.2 C) (Oral)   Wt 132 lb 12.8 oz (60.2 kg)   BMI 23.52 kg/m  Nursing note and vital signs reviewed.  Physical Exam  Constitutional: She is oriented to person, place, and time. She appears well-developed and well-nourished. No distress.  Seated in the chair and dressed appropriately. Skin appears jaundiced. Appears thin  Neck: Neck supple.  Cardiovascular: Normal rate, regular rhythm, normal heart sounds and intact distal pulses. Exam reveals no gallop and no friction rub.  No  murmur heard. Pulmonary/Chest: Effort normal and breath sounds normal. No respiratory distress. She has no wheezes. She has no rales. She exhibits no tenderness.  Abdominal: Soft. Bowel sounds are normal. She exhibits no distension and no mass. There is no tenderness. There is no rebound and no guarding.  Lymphadenopathy:    She has no cervical adenopathy.  Neurological: She is alert and oriented to person, place, and time.  Skin: Skin is warm and dry.  Psychiatric: She has a normal mood and affect. Her behavior is normal. Judgment and thought content normal.        Assessment & Plan:   Problem List Items Addressed This Visit      Digestive   Chronic hepatitis B without delta agent without hepatic coma ([place]) - Primary    Sheryl [name]'s Hepatitis B appears to be slowly improving with her AST/ALT remaining elevated but trending downward since hospitalization. Continues to have nausea which is improved with medication. Her bilirubin is stable and significantly elevated causing the jaundice. Awaiting ursodiol. No tenderness on exam. Will check INR and direct bilirubin today. Continue current tenofovir. Follow up with Dr. Baxter Flattery in 2 months or sooner if needed pending blood work. Encouraged to increase caloric intake as able through nutritional supplements. 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 Recommend continued blood work with addition of INR to her blood work to help monitor liver function.       Hypokalemia    Hypokalemia noted on previous blood work with 1 L of fluid + 20K. Will recheck potassium today.           I am having Sheryl [name] maintain her ondansetron, promethazine,  polyethylene glycol, dexamethasone, omeprazole, prochlorperazine, metFORMIN, acyclovir, tenofovir, feeding  supplement (ENSURE ENLIVE), and ursodiol.   Follow-up:  With Dr. Baxter Flattery in 2 months - repeat blood work with previously scheduled lab work.   Mauricio Po, [place] for Infectious Disease

## 2017-03-03 NOTE — Assessment & Plan Note (Addendum)
Sheryl [name]'s Hepatitis B appears to be slowly improving with her AST/ALT remaining elevated but trending downward since hospitalization. Continues to have nausea which is improved with medication. Her bilirubin is stable and significantly elevated causing the jaundice. Awaiting ursodiol. No tenderness on exam. Will check INR and direct bilirubin today. Continue current tenofovir. Follow up with Dr. Baxter Flattery in 2 months or sooner if needed pending blood work. Encouraged to increase caloric intake as able through nutritional supplements. Will request recheck of liver function and INR with next lab work completed at the [facility]

## 2017-03-04 ENCOUNTER — Encounter: Payer: Self-pay | Admitting: Family

## 2017-03-04 ENCOUNTER — Telehealth: Payer: Self-pay | Admitting: *Deleted

## 2017-03-04 LAB — BASIC METABOLIC PANEL
BUN/Creatinine Ratio: 7 (calc) (ref 6–22)
BUN: 5 mg/dL — AB (ref 7–25)
CALCIUM: 8 mg/dL — AB (ref 8.6–10.2)
CO2: 25 mmol/L (ref 20–32)
Chloride: 103 mmol/L (ref 98–110)
Creat: 0.72 mg/dL (ref 0.50–1.10)
Glucose, Bld: 113 mg/dL — ABNORMAL HIGH (ref 65–99)
Potassium: 3.2 mmol/L — ABNORMAL LOW (ref 3.5–5.3)
SODIUM: 137 mmol/L (ref 135–146)

## 2017-03-04 LAB — PROTIME-INR
INR: 1.4 — AB
PROTHROMBIN TIME: 14.9 s — AB (ref 9.0–11.5)

## 2017-03-04 LAB — BILIRUBIN, DIRECT: Bilirubin, Direct: 7.5 mg/dL — ABNORMAL HIGH (ref 0.0–0.2)

## 2017-03-04 NOTE — Telephone Encounter (Signed)
-----   Message from [place], [place] sent at 03/04/2017 11:47 AM EST ----- Please inform patient that he clotting factors look okay and her potassium has improved. Please continue to follow up with Dr. Baxter Flattery with her next available or sooner with me if needed. We will request additional lab work for her next visit with oncology. Please continue to take the tenofovir.

## 2017-03-04 NOTE — Assessment & Plan Note (Signed)
Therapy currently held secondary to Hepatitis B flare and continues follow with the Oncology. Recommend continued blood work with addition of INR to her blood work to help monitor liver function.

## 2017-03-04 NOTE — Telephone Encounter (Signed)
Spoke with daughter, relayed results and  upcoming appointment 3/27 at 1:45 with Dr Baxter Flattery. She verbalized understanding, agreement. Landis Gandy, RN

## 2017-03-05 ENCOUNTER — Other Ambulatory Visit: Payer: Self-pay | Admitting: Nurse Practitioner

## 2017-03-05 DIAGNOSIS — R7989 Other specified abnormal findings of blood chemistry: Secondary | ICD-10-CM

## 2017-03-05 DIAGNOSIS — R945 Abnormal results of liver function studies: Principal | ICD-10-CM

## 2017-03-13 ENCOUNTER — Telehealth: Payer: Self-pay | Admitting: Hematology

## 2017-03-13 NOTE — Telephone Encounter (Signed)
Patient called to reschedule  °

## 2017-03-23 ENCOUNTER — Ambulatory Visit: Payer: Medicaid Other | Admitting: Hematology

## 2017-03-23 ENCOUNTER — Other Ambulatory Visit: Payer: Medicaid Other

## 2017-03-24 NOTE — Progress Notes (Signed)
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HCC)   10/07/2014 Imaging    Abdominal ultrasound showed mild splenomegaly, stable perisplenic complex fluid collection unchanged since 08/27/2010.      10/10/2014 Miscellaneous    Peripheral blood chemistry and leukocytosis with total white count 78K, comprised of large plasma cells and his normocytic anemia. There is a myeloid left shift with previous surgical radium blasts. Flow cytometry showed 64% plasma cells      10/10/2014 Bone Marrow Biopsy    Markedly hypercellular marrow (95%), Atypical plasma cells comprise 57% of the cellularity. There was diminished multilineage in hematopoiesis with adequate maturation. Breasts less than 1%), no overt dysplasia of the myeloid or erythroid lineages.       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      05/07/2015 - 05/12/2015 Hospital Admission    [facility], [facility]. ID workup was negative, EGD showed evidence of  gastritis and duodenitis, no H. pylori or CMV.      05/20/2015 - 05/24/2015 Hospital Admission    [facility]      07/11/2015 Bone Marrow Biopsy    Post transplant 100 a bone marrow biopsy showed hypocellular marrow, 20%, no increase in plasma cells (2%) or other abnormalities.      08/22/2015 - 01/02/2016 Chemotherapy    MaintenanceCyborD (cytoxan 3055mm2 iv, bortezomib 1.5 mg/m, dexamethasone 40 mg, every 2 weeks, changed to Velcade maintenance after 4 months treatment      01/16/2016 - 04/19/2016 Chemotherapy    Maintenance Velcade 1.3 mg/m every 2 weeks      02/22/2016 Pathology Results    BONE MARROW: Diagnosis Bone Marrow, Aspirate,Biopsy, and Clot, right iliac - NORMOCELLULAR BONE MARROW FOR AGE WITH TRILINEAGE HEMATOPOIESIS. - PLASMACYTOSIS (PLASMA CELLS 12%). - SEE COMMENT. PERIPHERAL BLOOD: - OCCASIONAL CIRCULATING PLASMA CELLS.      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 female is here because of recently diagnosed plasma cell leukemia. [facility] and is here to establish her local oncological care [place]  [place], does not speak [place], she is accompanied to the clinic by her daughter and interpreter.  She presented to our hospital with worsening dyspnea, fatigue, cough, subjective fevers and chills, and was admitted on 09/14/2014. She was found to have allergy WBC 54.1K, hemoglobin 8.1, plt 310, she was treated with symptom management, and was discharged home on 09/17/2014, with a plan to follow-up with  hematology. She represents to emergency room on 10/07/2014, was found to have white count of 78K, and worsening anemia with hemoglobin 6. She was seen by my partner Dr. Jonette Eva and plasma cell leukemia was suspected. She was transferred to [facility] for further leukemia work-up and treatment. Bone marrow biopsy was done, which confirmed plasma cell leukemia, and she started chemotherapy with Velcade, Cytoxan and dexamethasone. She received weekly dose twice, last dose on 10/20/2014. She was discharged to home afterwards. She had a mediport placed during her hospitalization.  She has moderate fatigue, low appetite, she lost about 13 lbs in the past few months. She has moderate abdominal pain, 5-6/10, persistent, she does not take any pain meds.   I reviewed her medical records extensively, and discussed her case with Dr. Jorje Guild and [place] program coordinator.  CURRENT THERAPY:   1. Velcade 1.11m/m2 and dexa 495mweekly 2. Daratumumab weekly started on 04/18/2016, changed to every 2 weeks on 06/25/2016 and changed to every 4 weeks after 10/13/2016, per protocol  3. zometa every 4 weeks started on 11/27/2015, plan for 2 years  --All held since 02/02/17 due to hospitalization and persistent elevated LFTs.    INTERIM HISTORY:   Sheryl [name] is here for follow up of her acute plasma cell Leukemia and elevated LFTs. She has not had leukemia treatment since 02/02/2017 due to her 02/2017 hospitalization and persistent elevated LFTs.   She presents to the clinic today accompanied by her daughter and her translator. Pt notes she feels better since last visit but tired. She last saw Dr. RoNorma Fredrickson days ago.    On review of symptoms, pt notes she has been fatigued, her urine color is normal now. Her juandice has improved. She notes pain in her shoulders and upper arm and neck. Her ROM is still adequate. Her energy is better after eating but otherwise still fatigued. She has loss some weight but she notes  her appetite is good with smaller meals.      MEDICAL HISTORY:  Past Medical History:  Diagnosis Date  . Chills with fever    intermittently since d/c from hospital  . Dysuria-frequency syndrome    w/ pink urine  . GERD (gastroesophageal reflux disease)   . Hepatitis   . History of positive PPD    DX 2011--  CXR DONE NO EVIDENCE  . History of ureter stent   . Hydronephrosis, right   . Neuromuscular disorder (HCC)    legs numb intermittently  . 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[place]  . Pneumonia   . Right ureteral stone   . Urosepsis 8/14   ad[facility]    SURGICAL HISTORY: Past Surgical History:  Procedure Laterality Date  . CYSTOSCOPY W/ URETERAL STENT PLACEMENT Right 09/25/2012   Procedure: CYSTOSCOPY WITH RETROGRADE PYELOGRAM/URETERAL STENT PLACEMENT;  Surgeon: ThAlexis FrockMD;  Location: [facility];  Service: Urology;  Laterality: Right;  . CYSTOSCOPY WITH RETROGRADE PYELOGRAM, URETEROSCOPY AND STENT PLACEMENT Right 10/15/2012   Procedure: CYSTOSCOPY WITH RETROGRADE PYELOGRAM, URETEROSCOPY AND REMOVAL STENT WITH  STENT PLACEMENT;  Surgeon: ThAlexis FrockMD;  Location:  [facility];  Service: Urology;  Laterality: Right;  . ESOPHAGOGASTRODUODENOSCOPY (EGD) WITH PROPOFOL N/A 11/16/2014   Procedure: ESOPHAGOGASTRODUODENOSCOPY (EGD) WITH PROPOFOL;  Surgeon: Milus Banister, MD;  Location: [facility];  Service: Endoscopy;  Laterality: N/A;  . HOLMIUM LASER APPLICATION Right 10/06/4095   Procedure: HOLMIUM LASER APPLICATION;  Surgeon: Alexis Frock, MD;  Location: [facility];  Service: Urology;  Laterality: Right;  . LIVER BIOPSY    . OTHER SURGICAL HISTORY Right    removal of ovarian cyst  . removal of uterine cyst     years ago  . RIGHT VATS W/ DRAINAGE PEURAL EFFUSION AND BX'S  10-30-2008    SOCIAL HISTORY: Social History   Socioeconomic History  . Marital status: Single    Spouse name: None  . Number of children: 3  . Years of education:  None  . Highest education level: None  Social Needs  . Financial resource strain: None  . Food insecurity - worry: None  . Food insecurity - inability: None  . Transportation needs - medical: None  . Transportation needs - non-medical: None  Occupational History  . None  Tobacco Use  . Smoking status: Never Smoker  . Smokeless tobacco: Never Used  Substance and Sexual Activity  . Alcohol use: No    Alcohol/week: 0.0 oz  . Drug use: No  . Sexual activity: Not Currently    Birth control/protection: Abstinence  Other Topics Concern  . None  Social History Narrative  . None    FAMILY HISTORY: Family History  Problem Relation Age of Onset  . Stomach cancer Mother   . Lung disease Father   . Asthma Father     ALLERGIES:  has No Known Allergies.  MEDICATIONS:  Current Outpatient Medications  Medication Sig Dispense Refill  . acyclovir (ZOVIRAX) 800 MG tablet Take 1 tablet (800 mg total) by mouth 2 (two) times daily. 60 tablet 2  . feeding supplement, ENSURE ENLIVE, (ENSURE ENLIVE) LIQD Take 237 mLs by mouth 2 (two) times daily between meals. 237 mL 12  . metFORMIN (GLUCOPHAGE) 500 MG tablet Take 1 tablet (500 mg total) by mouth 2 (two) times daily with a meal. 60 tablet 2  . omeprazole (PRILOSEC) 20 MG capsule Take 1 capsule (20 mg total) by mouth daily. (Patient taking differently: Take 20 mg by mouth 2 (two) times daily before a meal. ) 30 capsule 5  . ondansetron (ZOFRAN) 8 MG tablet Take 1 tablet (8 mg total) by mouth every 8 (eight) hours as needed for nausea or vomiting. 30 tablet 1  . polyethylene glycol (MIRALAX / GLYCOLAX) packet Take 17 g by mouth daily as needed (constipation).     . prochlorperazine (COMPAZINE) 10 MG tablet Take 1 tablet (10 mg total) by mouth every 6 (six) hours as needed for nausea or vomiting. 30 tablet 0  . promethazine (PHENERGAN) 25 MG tablet Take 1 tablet (25 mg total) by mouth every 6 (six) hours as needed for nausea or vomiting. 30 tablet 0    . tenofovir (VIREAD) 300 MG tablet Take 1 tablet (300 mg total) by mouth daily. 30 tablet 6  . ursodiol (ACTIGALL) 300 MG capsule Take 1 capsule (300 mg total) by mouth 2 (two) times daily. 60 capsule 0  . dexamethasone (DECADRON) 4 MG tablet Take 10 tablets (40 mg total) by mouth once a week. (Patient not taking: Reported on 03/26/2017) 40 tablet 2   No current facility-administered medications for this visit.  Facility-Administered Medications Ordered in Other Visits  Medication Dose Route Frequency Provider Last Rate Last Dose  . sodium chloride 0.9 % injection 10 mL  10 mL Intravenous PRN Truitt Merle, MD   10 mL at 12/11/15 1532  . sodium chloride 0.9 % injection 10 mL  10 mL Intravenous PRN Truitt Merle, MD   10 mL at 06/11/16 1505  . sodium chloride flush (NS) 0.9 % injection 10 mL  10 mL Intravenous PRN Truitt Merle, MD   10 mL at 10/09/15 1717    REVIEW OF SYSTEMS:   Constitutional: no abnormal night sweats (+) fatigue (+) mild weight loss  Eyes: Denies blurriness of vision, double vision or watery eyes.  Ears, nose, mouth, throat, and face: Denies mucositis  Respiratory: Denies, dyspnea or wheezes  Cardiovascular: Denies palpitation, chest discomfort or lower extremity swelling Gastrointestinal:  Denies nausea, heartburn, reports regular bowel/bladder Skin: Denies abnormal skin rashes Musculoskeletal: (+) Neck, shoulder and upper arm pain.  Lymphatics: Denies new lymphadenopathy or easy bruising Neurological:Denies numbness, tingling or new weaknesses Behavioral/Psych: Mood is stable, no new changes  All other systems were reviewed with the patient and are negative.  PHYSICAL EXAMINATION:   ECOG PERFORMANCE STATUS: 2-3 Vitals:   03/26/17 0851  BP: 94/78  Pulse: (!) 112  Resp: 17  Temp: 98.2 F (36.8 C)  TempSrc: Oral  SpO2: 100%  Weight: 126 lb 1.6 oz (57.2 kg)  Height: '5\' 3"'  (1.6 m)   Vitals:   03/26/17 0851  BP: 94/78  Pulse: (!) 112  Resp: 17  Temp: 98.2 F (36.8  C)  TempSrc: Oral  SpO2: 100%  Weight: 126 lb 1.6 oz (57.2 kg)  Height: '5\' 3"'  (1.6 m)     GENERAL: She was wrapped with multiple blankets, not in respiratory distress. SKIN: skin color, texture, turgor are normal, no rashes or significant lesions EYES: normal, conjunctiva are pink and non-injected, sclera (+) jaundice, improved OROPHARYNX:no exudate, no erythema and lips, buccal mucosa NECK: supple, thyroid normal size, non-tender, without nodularity LYMPH:  no palpable lymphadenopathy in the cervical, axillary or inguinal LUNGS: clear to auscultation and percussion with normal breathing effort HEART: regular rate & rhythm and no murmurs and no lower extremity edema ABDOMEN:abdomen soft, moderate tenderness in the right upper quadrant of abdomen, no rebound pain, no Murphy sign, no hepatomegaly. Musculoskeletal:no cyanosis of digits and no clubbing  PSYCH: alert & oriented x 3 with fluent speech NEURO: no focal motor/sensory deficits  LABORATORY DATA:  I have reviewed the data as listed CBC Latest Ref Rng & Units 03/26/2017 03/02/2017 02/26/2017  WBC 3.9 - 10.3 K/uL 6.9 9.0 7.9  Hemoglobin 11.6 - 15.9 g/dL 12.4 10.4(L) 11.7  Hematocrit 34.8 - 46.6 % 37.5 31.6(L) 35.8  Platelets 145 - 400 K/uL 272 98(L) 131(L)    CMP Latest Ref Rng & Units 03/26/2017 03/03/2017 03/02/2017  Glucose 70 - 140 mg/dL 89 113(H) 185(H)  BUN 7 - 26 mg/dL 15 5(L) 6(L)  Creatinine 0.60 - 1.10 mg/dL 0.72 0.72 0.68  Sodium 136 - 145 mmol/L 140 137 135(L)  Potassium 3.5 - 5.1 mmol/L 4.3 3.2(L) 2.9(LL)  Chloride 98 - 109 mmol/L 105 103 104  CO2 22 - 29 mmol/L '23 25 23  ' Calcium 8.4 - 10.4 mg/dL 10.8(H) 8.0(L) 7.6(L)  Total Protein 6.4 - 8.3 g/dL 7.4 - 4.5(L)  Total Bilirubin 0.2 - 1.2 mg/dL 3.7(HH) - 11.5(HH)  Alkaline Phos 40 - 150 U/L 193(H) - 174(H)  AST 5 - 34 U/L 76(H) -  1,124(HH)  ALT 0 - 55 U/L 23 - 246(H)     SPEP M-protein  09/15/2014: 4.2 10/08/14: 4.6 12/08/2014: 0.2 02/15/2015: not sufficient  sample for test   09/11/2015: not observed 10/09/2015: not det   11/06/2015: not det  12/25/2015: not det  02/13/2016: 0.3 03/12/2016: 0.8 04/09/2016: 0.6 05/08/16: 0.1 06/04/16: 0.1 07/02/2016: 0.2 07/09/2016: 0.2 (dara specific IFE negative) 08/05/16: Not observed  09/01/16: Not observed 09/29/16: 0.1(dara specific IFE negative) 10/27/16: Not observed 11/24/16 : Not observed 12/23/16: 0.1 (Dara specific IFE showed IgG monoclonal protein with lambda light chain) 01/21/2017: 0.1 (Dara specific IFE showed IgG monoclonal protein with lambda light chain) 03/17/17: PENIDNG    IgG mg/dl 11/03/2014: 3150  12/08/2014:  759 02/14/2014: 860 09/11/2015: 7681 10/09/2015: 1457 11/06/2015: 1504 12/25/2015: 1400 02/13/2016: 1543 03/12/2016: 1860 04/09/2016: 1620 05/08/16: 749 06/04/16: 751 5/30/20187: 796 07/09/2016: 701 08/05/16: 678 09/01/16: 650 09/29/16: 727 10/27/16: 634 11/24/16: 685  12/23/16: 714 01/21/2017: 709 03/17/17: PENDING    Kappa/lambda light chains levels and ration  11/03/14: 0.75, 152, 0.00 12/22/2014: 1.10, 2.60, 0.42 02/15/2015: 9.59,14.35, 0.67 09/11/2015: 2.44, 2.72, 0.90 10/09/2015: 3.09, 3.49, 0.89 11/06/2015: 2.30, 2.62, 0.88 12/25/2015: 2.51, 3.0, 0.84 02/13/2016: 2.64, 15.3, 0.17 03/12/2016: 2.27, 45.5, 0.05 04/09/2016: 3.33, 45.5, 0.07 05/08/2016: 0.49, 1.21, 0.40 06/04/2016: 0.83, 1.11, 0.75 07/02/16: 0.51, 1.43, 0.36 07/23/16: 0.68, 1.02, 0.67 08/05/16: 0.62, 1.09, 0.57 09/01/16: 0.80, 1.21, 0.66 09/29/16: 0.44, 1.07, 0.41 10/27/16: 0.63, 1.44, 0.44 11/24/16: 0.83, 2.13, 0.39 12/23/16: 0.69, 2.31, 0.30 01/21/2017: 0.77, 3.08, 0.25   24 h urine UPEP/IFE and light chain: 11/03/2014: IFE showed a monoclonal IgG heavy chain with associated lambda light chain. M protein was Undetectable    PATHOLOGY   PATHOLOGY REPORT  Bone Marrow (BM) and Peripheral Blood (PB) FINAL PATHOLOGIC DIAGNOSIS  BONE MARROW: 07/11/2015 Hypocellular marrow (20%) with no increase in plasma cells (2%). See  comment. PERIPHERAL BLOOD: Mild anemia. No circulating plasma cells identified. See comment and CBC data.  BONE MARROW: 02/22/2016 Diagnosis Bone Marrow, Aspirate,Biopsy, and Clot, right iliac - NORMOCELLULAR BONE MARROW FOR AGE WITH TRILINEAGE HEMATOPOIESIS. - PLASMACYTOSIS (PLASMA CELLS 12%). - SEE COMMENT. PERIPHERAL BLOOD: - OCCASIONAL CIRCULATING PLASMA CELLS.   PROCEDURES  Bone Marrow Biopsy, 01/21/17 Bone Marrow, Aspirate, Biopsy, and Clot - NORMOCELLULAR BONE MARROW FOR AGE WITH TRILINEAGE HEMATOPOIESIS AND 2% PLASMA CELLS. PERIPHERAL BLOOD: - NORMOCYTIC-HYPOCHROMIC ANEMIA.   Colonoscopy by Dr. Ardis Hughs on 11/26/16  IMPRESSION - One 2 mm polyp in the transverse colon, removed with a cold biopsy forceps. Resected and retrieved. - Small internal and external hemorrhoids. - The examination was otherwise normal on direct and retroflexion views.   RADIOGRAPHIC STUDIES: I have personally reviewed the radiological images as listed and agreed with the findings in the report. No results found.   CXR 05/08/2016 IMPRESSION:  No acute disease.  CT Biopsy 02/22/16 IMPRESSION: 1. Technically successful CT guided right iliac bone core and aspiration biopsy.  ASSESSMENT & PLAN: 47 y.o. [place] woman, presented with anemia and leokocytosis    1.  Transaminitis and hyperbilirubinemia, secondary to hepatitis B flare -She has developed significant transaminitis and hyperbilirubinemia since January 2019 -She was previously on hepatitis B treatment entecavir by her GI at [name], however she was off treatment for 2-3 months in late 2018, due to her issues with refill.  She did refill and restarted at the end of December 2018, her repeated hepatitis B titer was very high in Jan 2019. -Ultrasound of liver and Doppler of liver was negative  -She was recently hospitalized  for this, and was switched to Tenofovir by Dr. Baxter Flattery. She will continue to be closely followed by ID Dr. Baxter Flattery   -Repeat labs showed persistent transaminitis, and worsening hyperbilirubinemia. I discussed with GI and the consensus is that she does not need MRCP.  -She now has Ursodiol and has been taking it for treatment. Her Juandice has improved.  -I discussed she does not have to see both Dr. Baxter Flattery and her GI doctor for treatment of her Hep B. She opted to continue to see Dr. Baxter Flattery. Next f/u in a month   -Recent last with Dr. Norma Fredrickson shows improvements in LFTs including total bili.  -No hepatomegaly or tenderness of liver upon exam today  -I encouraged her to have her family be tested for Hep B -Continue monitor   2. 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 Unfortunately this showed increased plasma cells 12% with additional lambda light chain staining pending. This is relapse of disease. Cytogenetics was normal  -Her M protein and lambda free light chain level has significantly increased in 02/2016, consistent with disease relapse. Her LP showed negative CSF, no evidence of CNS involvement. -I have previously spoken with Dr. Norma Fredrickson, we agreed to change her CyBorD to weekly, and add Daratumumab weekly, starting 04/18/16. Potential side effects were discussed with patient, especially cytopenia and infusion reaction, she is to take dexamethasone the day before Dara infusions.  -continue acyclovir  -Continue Zometa every 4 weeks for a total of 2 years (until 11/2017) -Due to her hospitalization for severe cytopenia and infection, Cytoxan has been held. -She restarted weekly Velcade and dexa, and on Dara every 4 weeks, tolerating well, will continue. Her dara-specific IFE was negative for M-protein on 07/07/16 at  [facility], she has achieved second CR.  -Her SPEP and IFE detected a small amount M-protein again, the Dara specific immunofixation was positive, this is concerning for recurrence.  We will continue monitor her SPEP and immunofixation every months -Bone Marrow Biopsy on 01/21/2017 showed 2% of plasma cells, monoclonal, no definitive evidence of plasma neoplasm.  It confirmed complete response. --Due to her recently hep B flare, her treatment has been held since 02/02/2017. Dr. Norma Fredrickson was informed. Her hepatitis B flare is likely related to daratumumab. -We will continue holding her plasma cell leukemia treatment until her LFTs improves significantly, will continue monitoring her disease. -Today's MM Panel is still pending. Her CBC is WNL now that she has been off chemotherapy for almost 2 months.  -Although she is probably still in remission, she is at high risk for relapse.  She was recently seen by Dr. Norma Fredrickson, she does not have a matched bone marrow, her transplant has been held temporarily.  Dr. Norma Fredrickson recommends switching her treatment to carfilzomib 56 mg/m2 on days 1 and 15 with dexamethasone 20 mg on same days and pomalidomide 2 mg on days 1-21 of 28 day cycle. -Plan to start in 2 weeks.  3. Type 2 diabetes, steroids induced  -She was noticed to have increased blood glucose level lately, with random blood glucose above 200. She also developed diabetic symptoms. No history of diabetes in the past. -This is likely steroids induced hyperglycemia -I strongly recommended her to avoid any soft drink and sweats, and  watch her carbohydrate intake, and exercise more  -The addition of Metformin has been discussed with her PCP.  -I previously instructed her to take metformin every day since she is now taking dexamethasone more frequently.  -She restarted her dexamethasone weekly as part of her leukemia treatment, and she will also receive steroids as premedication, I recommend her to take metformin  500 mg twice daily continuously, to control her hyperglycemia.Potential side effects discussed, she agreed to proceed. -Continue close monitoring. -We previously discussed restarting to metformin one pill a day while on steroids to see if her levels will stabilize. I previously encouraged her to take it in the morning with breakfast.  -Due to increase in steroids, Pt has been on metformin to twice daily.   -Her BG is better controlled with metformin.   4. GERD, history of GI bleeding and nausea  -Continue omeprazole daily. We previously discussed steroids can worsen her acid reflux. -She previously had an EGD at [facility] in 2017. This was benign.  -she did not do her colonoscopy because she did not have anyone to accompany her during visit. She has recently seen Dr. Ardis Hughs again, and is scheduled for colonoscopy. -Colonoscopy on 11/26/16 per Dr. Ardis Hughs notable for a polyp in the transverse colon, revealed to be tubular adenoma and negative for high grade dysplasia or malignancy.    Plan  -Due to her recent hepatitis B flare, will stop Daratumumab and Velcade. -will start carfilzomib 56 mg/m2 on days 1 and 15 (except 36m/m2 on C1D1 and C1D2) with dexamethasone 20 mg on same days and pomalidomide 2 mg on days 1-21 of 28 day cycle, in about 2 weeks  -f/u and start treatment on 3/4    All questions were answered. The patient knows to call the clinic with any problems, questions or concerns.  I spent 20 minutes counseling the patient face to face.  The total time spent in the appointment was 25 minutes and more than 50% was on counseling.     YTruitt Merle MD  03/27/2017    This document serves as a record of services personally performed by YTruitt Merle MD. It was created on her behalf by AJoslyn Devon a trained medical scribe. The creation of this record is based on the scribe's personal observations and the provider's statements to them.    I have reviewed the above documentation for  accuracy and completeness, and I agree with the above.

## 2017-03-26 ENCOUNTER — Inpatient Hospital Stay: Payer: Medicaid Other | Attending: Hematology

## 2017-03-26 ENCOUNTER — Inpatient Hospital Stay (HOSPITAL_BASED_OUTPATIENT_CLINIC_OR_DEPARTMENT_OTHER): Payer: Medicaid Other | Admitting: Hematology

## 2017-03-26 VITALS — BP 94/78 | HR 112 | Temp 98.2°F | Resp 17 | Ht 63.0 in | Wt 126.1 lb

## 2017-03-26 DIAGNOSIS — B1910 Unspecified viral hepatitis B without hepatic coma: Secondary | ICD-10-CM | POA: Diagnosis not present

## 2017-03-26 DIAGNOSIS — E099 Drug or chemical induced diabetes mellitus without complications: Secondary | ICD-10-CM

## 2017-03-26 DIAGNOSIS — T380X5A Adverse effect of glucocorticoids and synthetic analogues, initial encounter: Secondary | ICD-10-CM

## 2017-03-26 DIAGNOSIS — C9012 Plasma cell leukemia in relapse: Secondary | ICD-10-CM | POA: Diagnosis not present

## 2017-03-26 DIAGNOSIS — R7989 Other specified abnormal findings of blood chemistry: Secondary | ICD-10-CM

## 2017-03-26 DIAGNOSIS — C9010 Plasma cell leukemia not having achieved remission: Secondary | ICD-10-CM

## 2017-03-26 DIAGNOSIS — D649 Anemia, unspecified: Secondary | ICD-10-CM

## 2017-03-26 DIAGNOSIS — R740 Nonspecific elevation of levels of transaminase and lactic acid dehydrogenase [LDH]: Secondary | ICD-10-CM | POA: Diagnosis not present

## 2017-03-26 DIAGNOSIS — B181 Chronic viral hepatitis B without delta-agent: Secondary | ICD-10-CM

## 2017-03-26 DIAGNOSIS — R945 Abnormal results of liver function studies: Secondary | ICD-10-CM

## 2017-03-26 DIAGNOSIS — K219 Gastro-esophageal reflux disease without esophagitis: Secondary | ICD-10-CM

## 2017-03-26 LAB — CBC WITH DIFFERENTIAL/PLATELET
Basophils Absolute: 0 10*3/uL (ref 0.0–0.1)
Basophils Relative: 1 %
EOS PCT: 1 %
Eosinophils Absolute: 0 10*3/uL (ref 0.0–0.5)
HEMATOCRIT: 37.5 % (ref 34.8–46.6)
Hemoglobin: 12.4 g/dL (ref 11.6–15.9)
LYMPHS ABS: 1.3 10*3/uL (ref 0.9–3.3)
LYMPHS PCT: 19 %
MCH: 31.5 pg (ref 25.1–34.0)
MCHC: 33.1 g/dL (ref 31.5–36.0)
MCV: 95.1 fL (ref 79.5–101.0)
MONO ABS: 0.6 10*3/uL (ref 0.1–0.9)
MONOS PCT: 9 %
NEUTROS ABS: 4.9 10*3/uL (ref 1.5–6.5)
Neutrophils Relative %: 70 %
Platelets: 272 10*3/uL (ref 145–400)
RBC: 3.94 MIL/uL (ref 3.70–5.45)
RDW: 25.5 % — ABNORMAL HIGH (ref 11.2–14.5)
WBC: 6.9 10*3/uL (ref 3.9–10.3)

## 2017-03-26 LAB — COMPREHENSIVE METABOLIC PANEL
ALK PHOS: 193 U/L — AB (ref 40–150)
ALT: 23 U/L (ref 0–55)
ANION GAP: 12 — AB (ref 3–11)
AST: 76 U/L — AB (ref 5–34)
Albumin: 3.4 g/dL — ABNORMAL LOW (ref 3.5–5.0)
BUN: 15 mg/dL (ref 7–26)
CO2: 23 mmol/L (ref 22–29)
Calcium: 10.8 mg/dL — ABNORMAL HIGH (ref 8.4–10.4)
Chloride: 105 mmol/L (ref 98–109)
Creatinine, Ser: 0.72 mg/dL (ref 0.60–1.10)
GFR calc Af Amer: >60 mL/min (ref >60)
GFR calc non Af Amer: >60 mL/min (ref >60)
Glucose, Bld: 89 mg/dL (ref 70–140)
POTASSIUM: 4.3 mmol/L (ref 3.5–5.1)
SODIUM: 140 mmol/L (ref 136–145)
Total Bilirubin: 3.7 mg/dL (ref 0.2–1.2)
Total Protein: 7.4 g/dL (ref 6.4–8.3)

## 2017-03-26 LAB — PROTIME-INR
INR: 0.87
Prothrombin Time: 11.7 seconds (ref 11.4–15.2)

## 2017-03-27 ENCOUNTER — Telehealth: Payer: Self-pay | Admitting: *Deleted

## 2017-03-27 ENCOUNTER — Telehealth: Payer: Self-pay | Admitting: Hematology

## 2017-03-27 ENCOUNTER — Encounter: Payer: Self-pay | Admitting: Hematology

## 2017-03-27 MED ORDER — POMALIDOMIDE 2 MG PO CAPS: 2.0000 mg | ORAL_CAPSULE | Freq: Every day | ORAL | 1 refills | Status: DC

## 2017-03-27 MED ORDER — DEXAMETHASONE 4 MG PO TABS: 20.0000 mg | ORAL_TABLET | ORAL | 2 refills | Status: DC

## 2017-03-27 NOTE — Telephone Encounter (Signed)
Appointment scheduled mailed to patient per 2/22 sch msg

## 2017-03-27 NOTE — Telephone Encounter (Signed)
Attempted to enroll pt with [employer] for [employer].  Spoke with daughter Tanveer Dobberstein ( also  POA ) to enroll pt electronically.  However, unable to complete the enrollment on line due to pt's risk category -  Pt is under 30 yrs, and is an adult with childbearing potential.   Spoke with [employer] representative, and was informed that pt or POA will have to come in office to complete the form for enrollment. Spoke with [name], and was told that she could come in on Monday 03/30/17.

## 2017-03-27 NOTE — Progress Notes (Signed)
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

## 2017-03-30 ENCOUNTER — Telehealth: Payer: Self-pay | Admitting: Pharmacist

## 2017-03-30 LAB — PROTEIN ELECTROPHORESIS, SERUM
A/G RATIO SPE: 1.1 (ref 0.7–1.7)
ALPHA-2-GLOBULIN: 1 g/dL (ref 0.4–1.0)
Albumin ELP: 3.6 g/dL (ref 2.9–4.4)
Alpha-1-Globulin: 0.3 g/dL (ref 0.0–0.4)
Beta Globulin: 1.2 g/dL (ref 0.7–1.3)
GLOBULIN, TOTAL: 3.3 g/dL (ref 2.2–3.9)
Gamma Globulin: 0.8 g/dL (ref 0.4–1.8)
M-SPIKE, %: 0.2 g/dL — AB
Total Protein ELP: 6.9 g/dL (ref 6.0–8.5)

## 2017-03-30 NOTE — Telephone Encounter (Signed)
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  Labs from 03/26/17 assessed, OK for treatment. Noted bilirubin remains elevated to 3.7 on CMET performed 03/26/17  Current medication list in Epic reviewed, no significant DDIs with Pomalyst or Kyprolis identified. No agent for thromboprophylaxis noted on medication list. This will be discussed with MD and patient.  Prescription will be e-scribed to [facility] for benefits analysis and approval as Pomalyst is a limited distribution medication once [employer] authorization number is obtained by [employer].  Oral Oncology Clinic will continue to follow for insurance authorization, copayment issues, initial counseling and start date.  Johny Drilling, PharmD, BCPS, BCOP 03/30/2017 11:31 AM Oral Oncology Clinic [phone]

## 2017-03-30 NOTE — Telephone Encounter (Signed)
Oral Chemotherapy Pharmacist Encounter   I spoke with patient's daughter, [name], for overview of: Pomalyst.   Counseled on administration, dosing, side effects, monitoring, drug-food interactions, safe handling, storage, and disposal.  Patient will take Pomalyst 2mg  capsules, 1 capsule by mouth once daily, without regard to food, with a full glass of water.  Pomalyst will be given 21 days on, 7 days off, repeat every 28 days.  Patient will take dexamethasone 4mg  tablets, 5 tablets (20mg ) by once mouth every other week with breakfast on the days of Kyprolis.  Kyprolis will be given at 20mg /m2 on days 1 and 2 of cycle 1, then increased to 56 mg/m2 given once every 2 weeks if tolerated.  Pomalyst and Kyprolis start date: 04/06/17  Side effects of Pomalyst include but not limited to: nausea, constipation, diarrhea, abdominal pain, rash, fatigue, drug fever, peripheral edema, and decreased blood counts.    Reviewed with patient importance of keeping a medication schedule and plan for any missed doses.  Goi voiced understanding and appreciation.   All questions answered. Medication reconciliation performed and medication/allergy list updated.  Patient continues on acyclovir. Goi counseled on importance of daily aspirin 81mg  for VTE prophylaxis. She will obtain some for her mom to start with Pomalyst.  I provided Goi with phone number to [place] [phone]).  Patient knows to call the office with questions or concerns. Oral Oncology Clinic will continue to follow.  Johny Drilling, PharmD, BCPS, BCOP 03/30/2017   2:46 PM Oral Oncology Clinic [phone]

## 2017-04-01 ENCOUNTER — Telehealth: Payer: Self-pay | Admitting: *Deleted

## 2017-04-01 ENCOUNTER — Other Ambulatory Visit: Payer: Self-pay | Admitting: Hematology

## 2017-04-01 DIAGNOSIS — C9012 Plasma cell leukemia in relapse: Secondary | ICD-10-CM

## 2017-04-01 NOTE — Telephone Encounter (Signed)
Called Daughter, [name] to see if she can bring mother in tomorrow for pregnancy test.  She will bring her @ 9am.  Message to scheduler.

## 2017-04-02 ENCOUNTER — Inpatient Hospital Stay: Payer: Medicaid Other

## 2017-04-02 ENCOUNTER — Telehealth: Payer: Self-pay | Admitting: *Deleted

## 2017-04-02 ENCOUNTER — Other Ambulatory Visit: Payer: Self-pay | Admitting: Hematology

## 2017-04-02 DIAGNOSIS — C9012 Plasma cell leukemia in relapse: Secondary | ICD-10-CM

## 2017-04-02 DIAGNOSIS — C9010 Plasma cell leukemia not having achieved remission: Secondary | ICD-10-CM

## 2017-04-02 DIAGNOSIS — R740 Nonspecific elevation of levels of transaminase and lactic acid dehydrogenase [LDH]: Secondary | ICD-10-CM | POA: Diagnosis not present

## 2017-04-02 LAB — IFE, DARA-SPECIFIC, SERUM
IGA: 50 mg/dL — AB (ref 87–352)
IGM (IMMUNOGLOBULIN M), SRM: 50 mg/dL (ref 26–217)
IgG (Immunoglobin G), Serum: 874 mg/dL (ref 700–1600)

## 2017-04-02 LAB — PREGNANCY, URINE: PREG TEST UR: NEGATIVE

## 2017-04-02 MED ORDER — POMALIDOMIDE 2 MG PO CAPS: 2.0000 mg | ORAL_CAPSULE | Freq: Every day | ORAL | 1 refills | Status: DC

## 2017-04-02 NOTE — Telephone Encounter (Signed)
-----   Message from Truitt Merle, MD sent at 04/02/2017 11:03 AM EST ----- Please submit her urine pregnancy test result to [employer], thanks   [employer]  04/02/2017

## 2017-04-02 NOTE — Telephone Encounter (Signed)
Called daughter [name], and gave her [employer] phone number to call for pt to do survey today prior to [employer]

## 2017-04-02 NOTE — Telephone Encounter (Signed)
Called daughter [name], and gave her [facility] phone number for pt to call to take survey for Pomalyst.  Urine pregnancy test was Negative.    Obtained Authorization  #      [id number]    04/02/17.     Script gave to [place], oral chemo specialist.

## 2017-04-03 NOTE — Telephone Encounter (Signed)
Oral Oncology Patient Advocate Encounter  Received confirmation from Sheryl [name] that patient will receive her initial shipment of Pomalyst on 04/04/2017.   [name]. [name], [place] Patient [place] [phone] 04/03/2017 11:47 AM

## 2017-04-06 ENCOUNTER — Inpatient Hospital Stay: Payer: Medicaid Other

## 2017-04-06 ENCOUNTER — Other Ambulatory Visit: Payer: Self-pay

## 2017-04-06 ENCOUNTER — Inpatient Hospital Stay (HOSPITAL_BASED_OUTPATIENT_CLINIC_OR_DEPARTMENT_OTHER): Payer: Medicaid Other | Admitting: Nurse Practitioner

## 2017-04-06 ENCOUNTER — Telehealth: Payer: Self-pay | Admitting: Pharmacist

## 2017-04-06 ENCOUNTER — Encounter: Payer: Self-pay | Admitting: Nurse Practitioner

## 2017-04-06 ENCOUNTER — Ambulatory Visit (HOSPITAL_COMMUNITY)
Admission: RE | Admit: 2017-04-06 | Discharge: 2017-04-06 | Disposition: A | Discharge status: Home / Self Care | Payer: Medicaid Other | Source: Ambulatory Visit | Attending: Nurse Practitioner | Admitting: Nurse Practitioner

## 2017-04-06 ENCOUNTER — Inpatient Hospital Stay: Payer: Medicaid Other | Attending: Hematology

## 2017-04-06 ENCOUNTER — Other Ambulatory Visit: Payer: Self-pay | Admitting: *Deleted

## 2017-04-06 VITALS — BP 115/70 | HR 109 | Temp 100.7°F | Resp 20 | Ht 63.0 in | Wt 130.7 lb

## 2017-04-06 DIAGNOSIS — B370 Candidal stomatitis: Secondary | ICD-10-CM | POA: Diagnosis not present

## 2017-04-06 DIAGNOSIS — R509 Fever, unspecified: Secondary | ICD-10-CM

## 2017-04-06 DIAGNOSIS — C9010 Plasma cell leukemia not having achieved remission: Secondary | ICD-10-CM

## 2017-04-06 DIAGNOSIS — R740 Nonspecific elevation of levels of transaminase and lactic acid dehydrogenase [LDH]: Secondary | ICD-10-CM | POA: Diagnosis not present

## 2017-04-06 DIAGNOSIS — E1165 Type 2 diabetes mellitus with hyperglycemia: Secondary | ICD-10-CM | POA: Diagnosis not present

## 2017-04-06 DIAGNOSIS — K219 Gastro-esophageal reflux disease without esophagitis: Secondary | ICD-10-CM | POA: Insufficient documentation

## 2017-04-06 DIAGNOSIS — B1910 Unspecified viral hepatitis B without hepatic coma: Secondary | ICD-10-CM

## 2017-04-06 DIAGNOSIS — C9012 Plasma cell leukemia in relapse: Secondary | ICD-10-CM

## 2017-04-06 DIAGNOSIS — E876 Hypokalemia: Secondary | ICD-10-CM | POA: Diagnosis not present

## 2017-04-06 DIAGNOSIS — Z5112 Encounter for antineoplastic immunotherapy: Secondary | ICD-10-CM | POA: Insufficient documentation

## 2017-04-06 DIAGNOSIS — Z95828 Presence of other vascular implants and grafts: Secondary | ICD-10-CM

## 2017-04-06 DIAGNOSIS — J09X2 Influenza due to identified novel influenza A virus with other respiratory manifestations: Secondary | ICD-10-CM | POA: Insufficient documentation

## 2017-04-06 DIAGNOSIS — R112 Nausea with vomiting, unspecified: Secondary | ICD-10-CM

## 2017-04-06 DIAGNOSIS — J101 Influenza due to other identified influenza virus with other respiratory manifestations: Secondary | ICD-10-CM

## 2017-04-06 LAB — COMPREHENSIVE METABOLIC PANEL
ALT: 16 U/L (ref 0–55)
AST: 62 U/L — ABNORMAL HIGH (ref 5–34)
Albumin: 3.3 g/dL — ABNORMAL LOW (ref 3.5–5.0)
Alkaline Phosphatase: 112 U/L (ref 40–150)
Anion gap: 11 (ref 3–11)
BUN: 11 mg/dL (ref 7–26)
CHLORIDE: 104 mmol/L (ref 98–109)
CO2: 22 mmol/L (ref 22–29)
CREATININE: 0.83 mg/dL (ref 0.60–1.10)
Calcium: 8.9 mg/dL (ref 8.4–10.4)
Glucose, Bld: 113 mg/dL (ref 70–140)
POTASSIUM: 3.1 mmol/L — AB (ref 3.5–5.1)
Sodium: 137 mmol/L (ref 136–145)
Total Bilirubin: 2.3 mg/dL — ABNORMAL HIGH (ref 0.2–1.2)
Total Protein: 6.5 g/dL (ref 6.4–8.3)

## 2017-04-06 LAB — URINALYSIS, COMPLETE (UACMP) WITH MICROSCOPIC
Bacteria, UA: NONE SEEN
Bilirubin Urine: NEGATIVE
GLUCOSE, UA: NEGATIVE mg/dL
HGB URINE DIPSTICK: NEGATIVE
Ketones, ur: 5 mg/dL — AB
Leukocytes, UA: NEGATIVE
Nitrite: NEGATIVE
PROTEIN: NEGATIVE mg/dL
Specific Gravity, Urine: 1.014 (ref 1.005–1.030)
pH: 6 (ref 5.0–8.0)

## 2017-04-06 LAB — CBC WITH DIFFERENTIAL/PLATELET
Basophils Absolute: 0 10*3/uL (ref 0.0–0.1)
Basophils Relative: 1 %
EOS ABS: 0 10*3/uL (ref 0.0–0.5)
EOS PCT: 0 %
HCT: 29.9 % — ABNORMAL LOW (ref 34.8–46.6)
Hemoglobin: 10 g/dL — ABNORMAL LOW (ref 11.6–15.9)
LYMPHS ABS: 1.2 10*3/uL (ref 0.9–3.3)
LYMPHS PCT: 36 %
MCH: 31.5 pg (ref 25.1–34.0)
MCHC: 33.4 g/dL (ref 31.5–36.0)
MCV: 94.4 fL (ref 79.5–101.0)
MONO ABS: 0.8 10*3/uL (ref 0.1–0.9)
Monocytes Relative: 24 %
Neutro Abs: 1.3 10*3/uL — ABNORMAL LOW (ref 1.5–6.5)
Neutrophils Relative %: 39 %
PLATELETS: 166 10*3/uL (ref 145–400)
RBC: 3.17 MIL/uL — ABNORMAL LOW (ref 3.70–5.45)
RDW: 20.7 % — AB (ref 11.2–14.5)
WBC: 3.4 10*3/uL — ABNORMAL LOW (ref 3.9–10.3)

## 2017-04-06 LAB — INFLUENZA PANEL BY PCR (TYPE A & B)
INFLAPCR: POSITIVE — AB
Influenza B By PCR: NEGATIVE

## 2017-04-06 MED ORDER — HEPARIN SOD (PORK) LOCK FLUSH 100 UNIT/ML IV SOLN
500.0000 [IU] | Freq: Once | INTRAVENOUS | Status: AC | PRN
  Administered 2017-04-06: 500 [IU] via INTRAVENOUS
  Filled 2017-04-06: qty 5

## 2017-04-06 MED ORDER — OSELTAMIVIR PHOSPHATE 75 MG PO CAPS: 75.0000 mg | ORAL_CAPSULE | Freq: Two times a day (BID) | ORAL | 0 refills | Status: DC

## 2017-04-06 MED ORDER — ONDANSETRON HCL 4 MG/2ML IJ SOLN
8.0000 mg | Freq: Once | INTRAMUSCULAR | Status: AC
  Administered 2017-04-06: 8 mg via INTRAVENOUS

## 2017-04-06 MED ORDER — POTASSIUM CHLORIDE CRYS ER 20 MEQ PO TBCR: 20.0000 meq | EXTENDED_RELEASE_TABLET | Freq: Every day | ORAL | 0 refills | Status: DC

## 2017-04-06 MED ORDER — SODIUM CHLORIDE 0.9% FLUSH
10.0000 mL | INTRAVENOUS | Status: DC | PRN
  Administered 2017-04-06: 10 mL
  Filled 2017-04-06: qty 10

## 2017-04-06 MED ORDER — SODIUM CHLORIDE 0.9 % IV SOLN
Freq: Once | INTRAVENOUS | Status: AC
  Administered 2017-04-06: 12:00:00 via INTRAVENOUS

## 2017-04-06 MED ORDER — ONDANSETRON HCL 4 MG/2ML IJ SOLN
INTRAMUSCULAR | Status: AC
  Filled 2017-04-06: qty 4

## 2017-04-06 MED ORDER — SODIUM CHLORIDE 0.9 % IV SOLN: Freq: Once | INTRAVENOUS | Status: DC

## 2017-04-06 MED ORDER — SODIUM CHLORIDE 0.9 % IJ SOLN
10.0000 mL | INTRAMUSCULAR | Status: DC | PRN
  Administered 2017-04-06: 10 mL via INTRAVENOUS
  Filled 2017-04-06: qty 10

## 2017-04-06 MED FILL — TAMIFLU 75 MG GELCAP: 75 | 5 days supply | Qty: 10 | Fill #0

## 2017-04-06 MED FILL — POTASSIUM CL ER 20 MEQ TABL: 20 | 5 days supply | Qty: 5 | Fill #0

## 2017-04-06 NOTE — Telephone Encounter (Signed)
Oral Oncology Pharmacist Encounter  Received fax notification from [facility] that ICD-10 code was required for Pomalyst prescription processing, and to contact the pharmacy with this information.  Noted that the office had previously received communication from the pharmacy that Pomalyst prescription would be delivered to patient on 04/04/2017.  I have sent communication to [facility] with ICD-10 code, and request for prescription update.  Oral Oncology Clinic will continue to follow.  Johny Drilling, PharmD, BCPS, BCOP 04/06/2017 2:43 PM Oral Oncology Clinic [phone]

## 2017-04-06 NOTE — Patient Instructions (Signed)
Dehydration, Adult Dehydration is a condition in which there is not enough fluid or water in the body. This happens when you lose more fluids than you take in. Important organs, such as the kidneys, brain, and heart, cannot function without a proper amount of fluids. Any loss of fluids from the body can lead to dehydration. Dehydration can range from mild to severe. This condition should be treated right away to prevent it from becoming severe. What are the causes? This condition may be caused by:  Vomiting.  Diarrhea.  Excessive sweating, such as from heat exposure or exercise.  Not drinking enough fluid, especially: ? When ill. ? While doing activity that requires a lot of energy.  Excessive urination.  Fever.  Infection.  Certain medicines, such as medicines that cause the body to lose excess fluid (diuretics).  Inability to access safe drinking water.  Reduced physical ability to get adequate water and food.  What increases the risk? This condition is more likely to develop in people:  Who have a poorly controlled long-term (chronic) illness, such as diabetes, heart disease, or kidney disease.  Who are age 65 or older.  Who are disabled.  Who live in a place with high altitude.  Who play endurance sports.  What are the signs or symptoms? Symptoms of mild dehydration may include:  Thirst.  Dry lips.  Slightly dry mouth.  Dry, warm skin.  Dizziness. Symptoms of moderate dehydration may include:  Very dry mouth.  Muscle cramps.  Dark urine. Urine may be the color of tea.  Decreased urine production.  Decreased tear production.  Heartbeat that is irregular or faster than normal (palpitations).  Headache.  Light-headedness, especially when you stand up from a sitting position.  Fainting (syncope). Symptoms of severe dehydration may include:  Changes in skin, such as: ? Cold and clammy skin. ? Blotchy (mottled) or pale skin. ? Skin that does  not quickly return to normal after being lightly pinched and released (poor skin turgor).  Changes in body fluids, such as: ? Extreme thirst. ? No tear production. ? Inability to sweat when body temperature is high, such as in hot weather. ? Very little urine production.  Changes in vital signs, such as: ? Weak pulse. ? Pulse that is more than 100 beats a minute when sitting still. ? Rapid breathing. ? Low blood pressure.  Other changes, such as: ? Sunken eyes. ? Cold hands and feet. ? Confusion. ? Lack of energy (lethargy). ? Difficulty waking up from sleep. ? Short-term weight loss. ? Unconsciousness. How is this diagnosed? This condition is diagnosed based on your symptoms and a physical exam. Blood and urine tests may be done to help confirm the diagnosis. How is this treated? Treatment for this condition depends on the severity. Mild or moderate dehydration can often be treated at home. Treatment should be started right away. Do not wait until dehydration becomes severe. Severe dehydration is an emergency and it needs to be treated in a hospital. Treatment for mild dehydration may include:  Drinking more fluids.  Replacing salts and minerals in your blood (electrolytes) that you may have lost. Treatment for moderate dehydration may include:  Drinking an oral rehydration solution (ORS). This is a drink that helps you replace fluids and electrolytes (rehydrate). It can be found at pharmacies and retail stores. Treatment for severe dehydration may include:  Receiving fluids through an IV tube.  Receiving an electrolyte solution through a feeding tube that is passed through your nose   and into your stomach (nasogastric tube, or NG tube).  Correcting any abnormalities in electrolytes.  Treating the underlying cause of dehydration. Follow these instructions at home:  If directed by your health care provider, drink an ORS: ? Make an ORS by following instructions on the  package. ? Start by drinking small amounts, about  cup (120 mL) every 5-10 minutes. ? Slowly increase how much you drink until you have taken the amount recommended by your health care provider.  Drink enough clear fluid to keep your urine clear or pale yellow. If you were told to drink an ORS, finish the ORS first, then start slowly drinking other clear fluids. Drink fluids such as: ? Water. Do not drink only water. Doing that can lead to having too little salt (sodium) in the body (hyponatremia). ? Ice chips. ? Fruit juice that you have added water to (diluted fruit juice). ? Low-calorie sports drinks.  Avoid: ? Alcohol. ? Drinks that contain a lot of sugar. These include high-calorie sports drinks, fruit juice that is not diluted, and soda. ? Caffeine. ? Foods that are greasy or contain a lot of fat or sugar.  Take over-the-counter and prescription medicines only as told by your health care provider.  Do not take sodium tablets. This can lead to having too much sodium in the body (hypernatremia).  Eat foods that contain a healthy balance of electrolytes, such as bananas, oranges, potatoes, tomatoes, and spinach.  Keep all follow-up visits as told by your health care provider. This is important. Contact a health care provider if:  You have abdominal pain that: ? Gets worse. ? Stays in one area (localizes).  You have a rash.  You have a stiff neck.  You are more irritable than usual.  You are sleepier or more difficult to wake up than usual.  You feel weak or dizzy.  You feel very thirsty.  You have urinated only a small amount of very dark urine over 6-8 hours. Get help right away if:  You have symptoms of severe dehydration.  You cannot drink fluids without vomiting.  Your symptoms get worse with treatment.  You have a fever.  You have a severe headache.  You have vomiting or diarrhea that: ? Gets worse. ? Does not go away.  You have blood or green matter  (bile) in your vomit.  You have blood in your stool. This may cause stool to look black and tarry.  You have not urinated in 6-8 hours.  You faint.  Your heart rate while sitting still is over 100 beats a minute.  You have trouble breathing. This information is not intended to replace advice given to you by your health care provider. Make sure you discuss any questions you have with your health care provider. Document Released: 01/20/2005 Document Revised: 08/17/2015 Document Reviewed: 03/16/2015 Elsevier Interactive Patient Education  2018 [employer]  

## 2017-04-06 NOTE — Progress Notes (Signed)
Pt. Obviously feels bad-high temp, tachycardic, tachypneic.  Flu swab done per order of Cira Rue, NP  Tested + for Flu A.  Completed 1 liter of fluid, VSS, temp has come down.  Transported to Radiology via w/c per NT and then pt to go home after. Daughter is with pt.

## 2017-04-06 NOTE — Patient Instructions (Signed)

## 2017-04-06 NOTE — Progress Notes (Addendum)
[facility]  [phone] [phone]  Clinic Follow up Note   Patient Care Team: Williams, Dwight M, MD as PCP - General (Family Medicine) Williams, Dwight M, MD as Referring Physician (Specialist) Williams, Dwight M, MD as Referring Physician (Specialist) Rodriguez Valdes, Cesar, MD as Referring Physician (Hematology and Oncology) 04/06/2017  SUMMARY OF ONCOLOGIC HISTORY:   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HCC)   10/07/2014 Imaging    Abdominal ultrasound showed mild splenomegaly, stable perisplenic complex fluid collection unchanged since 08/27/2010.      10/10/2014 Miscellaneous    Peripheral blood chemistry and leukocytosis with total white count 78K, comprised of large plasma cells and his normocytic anemia. There is a myeloid left shift with previous surgical radium blasts. Flow cytometry showed 64% plasma cells      10/10/2014 Bone Marrow Biopsy    Markedly hypercellular marrow (95%), Atypical plasma cells comprise 57% of the cellularity. There was diminished multilineage in hematopoiesis with adequate maturation. Breasts less than 1%), no overt dysplasia of the myeloid or erythroid lineages.       10/10/2014 Initial Diagnosis    Plasma cell leukemia      10/13/2014 - 02/22/2015 Chemotherapy    CyborD (cytoxan 300mg/m2 iv, bortezomib 1.5 mg/m, dexamethasone 40 mg, weekly every 28 days, bortezomib and dexamethasone was given twice weekly for 2 weeks during the first cycle)      04/04/2015 Bone Marrow Transplant    autologous stem cell transplant at [facility]. Her transplant course was complicated by sepsis from Escherichia coli bacteremia and associated colitis, she was discharged home on 04/27/2015.      05/07/2015 - 05/12/2015 Hospital Admission    patient was admitted to [facility] for fever, tachycardia, nausea and abdominal pain. ID workup was negative, EGD showed evidence of gastritis and duodenitis, no H. pylori or CMV.      05/20/2015 - 05/24/2015  Hospital Admission    patient was admitted to  Hospital for sepsis from Escherichia coli UTI.      07/11/2015 Bone Marrow Biopsy    Post transplant 100 a bone marrow biopsy showed hypocellular marrow, 20%, no increase in plasma cells (2%) or other abnormalities.      08/22/2015 - 01/02/2016 Chemotherapy    MaintenanceCyborD (cytoxan 300mg/m2 iv, bortezomib 1.5 mg/m, dexamethasone 40 mg, every 2 weeks, changed to Velcade maintenance after 4 months treatment      01/16/2016 - 04/19/2016 Chemotherapy    Maintenance Velcade 1.3 mg/m every 2 weeks      02/22/2016 Pathology Results    BONE MARROW: Diagnosis Bone Marrow, Aspirate,Biopsy, and Clot, right iliac - NORMOCELLULAR BONE MARROW FOR AGE WITH TRILINEAGE HEMATOPOIESIS. - PLASMACYTOSIS (PLASMA CELLS 12%). - SEE COMMENT. PERIPHERAL BLOOD: - OCCASIONAL CIRCULATING PLASMA CELLS.      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       04/18/2016 - 03/26/2017 Chemotherapy    Daratumumab per protocol  CyBorD every week, cytoxan was held after 04/24/2016 dye to cytopenia and infection  -discontinued due to Hep B flare      05/11/2016 - 05/16/2016 Hospital Admission    Healthcare-associated pneumonia       Hospital Admission    Admit date: 02/16/17 - 02/21/17  Admission diagnosis: Abnormal LFT's Additional comments: Assoc diagnoses: Hep B flare, transminitis, dehydration, fever, SIRS      04/06/2017 -  Chemotherapy    PENDING carfilzomib 56 mg/m2 on days 1 and 15 (except 20mg/m2 on C1D1 and C1D2) with   dexamethasone 20 mg on same days and pomalidomide 2 mg on days 1-21 of 28 day cycle         CURRENT THERAPY: Due to her recent hepatitis B flare, will stop Daratumumab and Velcade. PENDING: carfilzomib 56 mg/m2 on days 1 and 15 (except 20mg/m2 on C1D1 and C1D2) with dexamethasone 20 mg on same days and pomalidomide 2 mg on days 1-21 of 28 day cycle   INTERVAL HISTORY: Ms. Larmer returns  for follow up as scheduled prior to first cycle polamyst/kyprolis. She feels ill with new onset fever, chills, dry cough, dizziness, and nausea with frequent vomiting that began 1 day ago. Her daughter reports a recent visitor was sick.  Had flu vaccine this year. Has not received chemo since 02/02/18. She has been feeling well with good appetite and energy level until yesterday. No diarrhea, chest pain, dyspnea, or syncope.   REVIEW OF SYSTEMS:   Constitutional: Denies fevers, chills or abnormal weight loss (+) fever (+) chills  Eyes: Denies blurriness of vision Ears, nose, mouth, throat, and face: Denies mucositis or sore throat Respiratory: Denies dyspnea or wheezes (+) dry cough Cardiovascular: Denies palpitation, chest discomfort or lower extremity swelling Gastrointestinal:  Denies heartburn, constipation, diarrhea, or change in bowel habits (+) nausea with vomiting  Skin: Denies abnormal skin rashes Lymphatics: Denies new lymphadenopathy or easy bruising Neurological:Denies numbness, tingling  (+) weak (+) dizzy Behavioral/Psych: Mood is stable, no new changes  All other systems were reviewed with the patient and are negative.  MEDICAL HISTORY:  Past Medical History:  Diagnosis Date  . Chills with fever    intermittently since d/c from hospital  . Dysuria-frequency syndrome    w/ pink urine  . GERD (gastroesophageal reflux disease)   . Hepatitis   . History of positive PPD    DX 2011--  CXR DONE NO EVIDENCE  . History of ureter stent   . Hydronephrosis, right   . Neuromuscular disorder (HCC)    legs numb intermittently  . 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HCC)   . Pneumonia   . Right ureteral stone   . Urosepsis 8/14   admitted to [facility]    SURGICAL HISTORY: Past Surgical History:  Procedure Laterality Date  . CYSTOSCOPY W/ URETERAL STENT PLACEMENT Right 09/25/2012   Procedure: CYSTOSCOPY WITH RETROGRADE PYELOGRAM/URETERAL STENT PLACEMENT;  Surgeon: Theodore Manny, MD;  Location: [facility];  Service: Urology;  Laterality: Right;  . CYSTOSCOPY WITH RETROGRADE PYELOGRAM, URETEROSCOPY AND STENT PLACEMENT Right 10/15/2012   Procedure: CYSTOSCOPY WITH RETROGRADE PYELOGRAM, URETEROSCOPY AND REMOVAL STENT WITH  STENT PLACEMENT;  Surgeon: Theodore Manny, MD;  Location: [facility];  Service: Urology;  Laterality: Right;  . ESOPHAGOGASTRODUODENOSCOPY (EGD) WITH PROPOFOL N/A 11/16/2014   Procedure: ESOPHAGOGASTRODUODENOSCOPY (EGD) WITH PROPOFOL;  Surgeon: Daniel P Jacobs, MD;  Location: [facility];  Service: Endoscopy;  Laterality: N/A;  . HOLMIUM LASER APPLICATION Right 10/15/2012   Procedure: HOLMIUM LASER APPLICATION;  Surgeon: Theodore Manny, MD;  Location:  SURGERY CENTER;  Service: Urology;  Laterality: Right;  . LIVER BIOPSY    . OTHER SURGICAL HISTORY Right    removal of ovarian cyst  . removal of uterine cyst     years ago  . RIGHT VATS W/ DRAINAGE PEURAL EFFUSION AND BX'S  10-30-2008    I have reviewed the social history and family history with the patient and they are unchanged from previous note.  ALLERGIES:  has No Known Allergies.  MEDICATIONS:  Current Outpatient Medications  Medication Sig   Dispense Refill  . acyclovir (ZOVIRAX) 800 MG tablet Take 1 tablet (800 mg total) by mouth 2 (two) times daily. 60 tablet 2  . dexamethasone (DECADRON) 4 MG tablet Take 5 tablets (20 mg total) by mouth every 14 (fourteen) days. 20 tablet 2  . feeding supplement, ENSURE ENLIVE, (ENSURE ENLIVE) LIQD Take 237 mLs by mouth 2 (two) times daily between meals. 237 mL 12  . metFORMIN (GLUCOPHAGE) 500 MG tablet Take 1 tablet (500 mg total) by mouth 2 (two) times daily with a meal. 60 tablet 2  . omeprazole (PRILOSEC) 20 MG capsule Take 1 capsule (20 mg total) by mouth daily. (Patient taking differently: Take 20 mg by mouth 2 (two) times daily before a meal. ) 30 capsule 5  . ondansetron (ZOFRAN) 8 MG tablet Take 1 tablet (8 mg total) by mouth every 8 (eight)  hours as needed for nausea or vomiting. 30 tablet 1  . oseltamivir (TAMIFLU) 75 MG capsule Take 1 capsule (75 mg total) by mouth 2 (two) times daily. 10 capsule 0  . polyethylene glycol (MIRALAX / GLYCOLAX) packet Take 17 g by mouth daily as needed (constipation).     . pomalidomide (POMALYST) 2 MG capsule Take 1 capsule (2 mg total) by mouth daily. Take with water on days 1-21. Repeat every 28 days. 21 capsule 1  . potassium chloride SA (K-DUR,KLOR-CON) 20 MEQ tablet Take 1 tablet (20 mEq total) by mouth daily. 5 tablet 0  . prochlorperazine (COMPAZINE) 10 MG tablet Take 1 tablet (10 mg total) by mouth every 6 (six) hours as needed for nausea or vomiting. 30 tablet 0  . promethazine (PHENERGAN) 25 MG tablet Take 1 tablet (25 mg total) by mouth every 6 (six) hours as needed for nausea or vomiting. 30 tablet 0  . tenofovir (VIREAD) 300 MG tablet Take 1 tablet (300 mg total) by mouth daily. 30 tablet 6  . ursodiol (ACTIGALL) 300 MG capsule Take 1 capsule (300 mg total) by mouth 2 (two) times daily. 60 capsule 0   Current Facility-Administered Medications  Medication Dose Route Frequency Provider Last Rate Last Dose  . sodium chloride 0.9 % injection 10 mL  10 mL Intravenous PRN Truitt Merle, MD   10 mL at 04/06/17 1443   Facility-Administered Medications Ordered in Other Visits  Medication Dose Route Frequency Provider Last Rate Last Dose  . sodium chloride 0.9 % injection 10 mL  10 mL Intravenous PRN Truitt Merle, MD   10 mL at 12/11/15 1532  . sodium chloride 0.9 % injection 10 mL  10 mL Intravenous PRN Truitt Merle, MD   10 mL at 06/11/16 1505  . sodium chloride flush (NS) 0.9 % injection 10 mL  10 mL Intravenous PRN Truitt Merle, MD   10 mL at 10/09/15 1717    PHYSICAL EXAMINATION: ECOG PERFORMANCE STATUS: 3 - Symptomatic, >50% confined to bed  Vitals:   04/06/17 1121 04/06/17 1348  BP: (!) 99/58 115/70  Pulse: (!) 120 (!) 109  Resp: 20   Temp: (!) 103.2 F (39.6 C) (!) 100.7 F (38.2 C)  SpO2:  100% 100%   Filed Weights   04/06/17 1121  Weight: 130 lb 11.2 oz (59.3 kg)    GENERAL: lethargic, ill appearing, no respiratory distress  SKIN:  flushed and warm EYES: normal, Conjunctiva are pink and non-injected, sclera clear OROPHARYNX:no exudate, no erythema and lips, buccal mucosa, and tongue normal  LYMPH:  no palpable lymphadenopathy in the cervical, axillary or inguinal LUNGS: tachypneic, clear  to auscultation bilaterally with normal breathing effort HEART: tachycardic, regular rhythm and no murmurs and no lower extremity edema ABDOMEN: abdomen soft, non-tender and normal bowel sounds Musculoskeletal:no cyanosis of digits and no clubbing  NEURO: alert & oriented x 3 with fluent speech, no focal motor/sensory deficits  LABORATORY DATA:  I have reviewed the data as listed CBC Latest Ref Rng & Units 04/06/2017 03/26/2017 03/02/2017  WBC 3.9 - 10.3 K/uL 3.4(L) 6.9 9.0  Hemoglobin 11.6 - 15.9 g/dL 10.0(L) 12.4 10.4(L)  Hematocrit 34.8 - 46.6 % 29.9(L) 37.5 31.6(L)  Platelets 145 - 400 K/uL 166 272 98(L)     CMP Latest Ref Rng & Units 04/06/2017 03/26/2017 03/03/2017  Glucose 70 - 140 mg/dL 113 89 113(H)  BUN 7 - 26 mg/dL 11 15 5(L)  Creatinine 0.60 - 1.10 mg/dL 0.83 0.72 0.72  Sodium 136 - 145 mmol/L 137 140 137  Potassium 3.5 - 5.1 mmol/L 3.1(L) 4.3 3.2(L)  Chloride 98 - 109 mmol/L 104 105 103  CO2 22 - 29 mmol/L 22 23 25  Calcium 8.4 - 10.4 mg/dL 8.9 10.8(H) 8.0(L)  Total Protein 6.4 - 8.3 g/dL 6.5 7.4 -  Total Bilirubin 0.2 - 1.2 mg/dL 2.3(H) 3.7(HH) -  Alkaline Phos 40 - 150 U/L 112 193(H) -  AST 5 - 34 U/L 62(H) 76(H) -  ALT 0 - 55 U/L 16 23 -      RADIOGRAPHIC STUDIES: I have personally reviewed the radiological images as listed and agreed with the findings in the report. No results found.   ASSESSMENT & PLAN: 46 y.o. [place] woman, presented with anemia and leokocytosis   1. Influenza A (+)  -She has acute febrile illness with chills, dry cough, nausea with  vomiting, VS abnormal but stable. CBC with leukopenia and mild anemia. She is positive for influenza A; urine negative for infection but positive for ketones, likely related to acute infection, emesis, and her fasting state. Blood cultures and CXR pending. She was treated in clinic with IVF and IV zofran, she reports some improvement prior to discharge. She tolerated ginger ale. VS improved slightly. Will hold antibiotics for now, prescribe tamiflu for her and recommended also for her daughter. We discussed if she becomes worse at home, she will need to be admitted for inpatient management. She understands.    2. Hypokalemia  -Likely due to acute GI loss through emesis. K 3.1. I prescribed oral K 20 mEq 1 tablet daily x5 days and have encouraged her to eat and drink more.  3. Transaminitis and hyperbilirubinemia, secondary to hepatitis B flare -LFTs and bili continue to improve; ALT and alk phos now normal, bili 2.3 today. Follows ID Dr. Snider. On ursodiol and tenofovir. Will monitor.   4. 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 She is at high risk of relapse, has been off therapy over 2 months for Hep B flare. Her M protein became detectable lately on 03/26/17. She was due to begin pomalyst/dex and kyprolis today, but given she is positive for the flu, will defer therapy for 1 week. Per Dr. Feng she will return in 1 week to start carfilzomib 56 mg/m2 on days 1 and 15 (except 20mg/m2 on C1D1 and C1D2) with dexamethasone 20 mg on same days and pomalidomide 2 mg on days 1-21 of 28 day cycle.  PLAN: -NS IVF, IV zofran today -Begin tamiflu, prescription sent to [facility] outpatient pharmacy -Begin   oral K 1 tablet daily x5 days -Return in 1 week for cycle 1 day 1 kyprolis, to begin pomalidomide 2 mg and dex 20 mg on same day for 21 days     Orders Placed This Encounter  Procedures  . Culture, Urine    Standing  Status:   Future    Number of Occurrences:   1    Standing Expiration Date:   04/06/2018    Order Specific Question:   Source    Answer:   port a cath  . Culture, Blood    Standing Status:   Future    Number of Occurrences:   1    Standing Expiration Date:   04/06/2018    Order Specific Question:   Source    Answer:   PAC, peripheral  . Blood culture (routine single)    Standing Status:   Standing    Number of Occurrences:   1  . Culture, blood (routine x 2)    One peripheral and one blood culture via port    Standing Status:   Standing    Number of Occurrences:   1  . DG Chest 2 View    Standing Status:   Future    Number of Occurrences:   1    Standing Expiration Date:   04/06/2018    Order Specific Question:   Reason for Exam (SYMPTOM  OR DIAGNOSIS REQUIRED)    Answer:   rapid onset fever, dry cough 1 day ago, immunocompromised with recent chemotherapy    Order Specific Question:   Is patient pregnant?    Answer:   No    Order Specific Question:   Preferred imaging location?    Answer:   [facility]    Order Specific Question:   Radiology Contrast Protocol - do NOT remove file path    Answer:   _0 [email or web address]  . Influenza panel by PCR (type A & B)    Standing Status:   Future    Number of Occurrences:   1    Standing Expiration Date:   04/07/2018  . Urinalysis, Complete w Microscopic    Standing Status:   Future    Number of Occurrences:   1    Standing Expiration Date:   04/07/2018  . SCHEDULING COMMUNICATION INJECTION    Schedule port flush appointment   All questions were answered. The patient knows to call the clinic with any problems, questions or concerns. No barriers to learning was detected. I spent 40 minutes counseling the patient face to face. The total time spent in the appointment was 45 minutes and more than 50% was on counseling, review of test results, and coordination of care.      Alla Feeling, NP 04/06/17    Addendum   I have seen the patient, examined her. I agree with the assessment and and plan and have edited the notes.   Ms Klopf unfortunately has caught influenza A infection.  She presented with acute illness since yesterday with high fever, malaise, mild dry cough and poor appetite.  Her vital signs are stable.  Chest x-ray and UA was unremarkable.  We did blood culture also.  No evidence of superimposed bacterial infection for now.  I recommend her to start the Tamiflu.  We will give her IV fluids and nausea medication.  We will call her in a couple days to see if she is improving at home.  We will hold on her chemo treatment today,  and rescheduled for next week.  To call us if she does not improve at home.  Truitt Merle  04/06/2017

## 2017-04-07 ENCOUNTER — Ambulatory Visit: Payer: Medicaid Other

## 2017-04-07 ENCOUNTER — Telehealth: Payer: Self-pay | Admitting: Hematology

## 2017-04-07 LAB — URINE CULTURE

## 2017-04-07 NOTE — Telephone Encounter (Signed)
Appointments scheduled letter / calendar printed and mailed to patient per 3/4 los

## 2017-04-09 ENCOUNTER — Telehealth: Payer: Self-pay | Admitting: Nurse Practitioner

## 2017-04-09 NOTE — Telephone Encounter (Signed)
I called to check on Sheryl [name]. Her daughter reports she is "much better" than she was on Monday but still febrile with chills and cough. She is eating and drinking adequately. I encouraged her to monitor fever, can take NSAID for fever reduction, avoid tylenol due to elevated LFTs. She can take OTC cough suppressant. I explained blood cultures, UA, and chest xray were negative. I encouraged her to call back if her condition worsens. I will call back to check on her tomorrow.

## 2017-04-10 ENCOUNTER — Telehealth: Payer: Self-pay | Admitting: Emergency Medicine

## 2017-04-10 NOTE — Telephone Encounter (Signed)
Hughie Closs, LPN  Alla Feeling, NP        Patients daughter reports Mrs.Robledo taking tamiflu since Monday. Last dose was taken today. Pt has a dry non-productive cough. PT is taking over the counter cough syrup. Denies fever/chills/body aches. C/o of mild fatigue. NO s/s of infection.

## 2017-04-11 LAB — CULTURE, BLOOD (SINGLE)
CULTURE: NO GROWTH
CULTURE: NO GROWTH
SPECIAL REQUESTS: ADEQUATE
SPECIAL REQUESTS: ADEQUATE

## 2017-04-13 ENCOUNTER — Inpatient Hospital Stay: Payer: Medicaid Other

## 2017-04-13 ENCOUNTER — Encounter: Payer: Self-pay | Admitting: Nurse Practitioner

## 2017-04-13 ENCOUNTER — Inpatient Hospital Stay (HOSPITAL_BASED_OUTPATIENT_CLINIC_OR_DEPARTMENT_OTHER): Payer: Medicaid Other | Admitting: Nurse Practitioner

## 2017-04-13 VITALS — BP 107/77 | HR 94 | Temp 98.1°F | Resp 17 | Ht 63.0 in | Wt 123.6 lb

## 2017-04-13 DIAGNOSIS — R630 Anorexia: Secondary | ICD-10-CM

## 2017-04-13 DIAGNOSIS — E876 Hypokalemia: Secondary | ICD-10-CM | POA: Diagnosis not present

## 2017-04-13 DIAGNOSIS — B181 Chronic viral hepatitis B without delta-agent: Secondary | ICD-10-CM

## 2017-04-13 DIAGNOSIS — C9010 Plasma cell leukemia not having achieved remission: Secondary | ICD-10-CM

## 2017-04-13 DIAGNOSIS — Z5112 Encounter for antineoplastic immunotherapy: Secondary | ICD-10-CM | POA: Diagnosis not present

## 2017-04-13 DIAGNOSIS — C9012 Plasma cell leukemia in relapse: Secondary | ICD-10-CM | POA: Diagnosis not present

## 2017-04-13 DIAGNOSIS — B370 Candidal stomatitis: Secondary | ICD-10-CM | POA: Diagnosis not present

## 2017-04-13 DIAGNOSIS — R740 Nonspecific elevation of levels of transaminase and lactic acid dehydrogenase [LDH]: Secondary | ICD-10-CM | POA: Diagnosis not present

## 2017-04-13 DIAGNOSIS — R5383 Other fatigue: Secondary | ICD-10-CM | POA: Diagnosis not present

## 2017-04-13 LAB — CBC WITH DIFFERENTIAL/PLATELET
Basophils Absolute: 0 10*3/uL (ref 0.0–0.1)
Basophils Relative: 0 %
EOS PCT: 1 %
Eosinophils Absolute: 0 10*3/uL (ref 0.0–0.5)
HCT: 32.7 % — ABNORMAL LOW (ref 34.8–46.6)
Hemoglobin: 11.2 g/dL — ABNORMAL LOW (ref 11.6–15.9)
LYMPHS PCT: 28 %
Lymphs Abs: 0.9 10*3/uL (ref 0.9–3.3)
MCH: 31.7 pg (ref 25.1–34.0)
MCHC: 34.2 g/dL (ref 31.5–36.0)
MCV: 92.6 fL (ref 79.5–101.0)
MONO ABS: 0.4 10*3/uL (ref 0.1–0.9)
Monocytes Relative: 12 %
Neutro Abs: 2 10*3/uL (ref 1.5–6.5)
Neutrophils Relative %: 59 %
PLATELETS: 180 10*3/uL (ref 145–400)
RBC: 3.53 MIL/uL — AB (ref 3.70–5.45)
RDW: 19.2 % — ABNORMAL HIGH (ref 11.2–14.5)
WBC: 3.4 10*3/uL — AB (ref 3.9–10.3)

## 2017-04-13 LAB — COMPREHENSIVE METABOLIC PANEL
ALT: 10 U/L (ref 0–55)
AST: 39 U/L — ABNORMAL HIGH (ref 5–34)
Albumin: 3.2 g/dL — ABNORMAL LOW (ref 3.5–5.0)
Alkaline Phosphatase: 93 U/L (ref 40–150)
Anion gap: 10 (ref 3–11)
BUN: 6 mg/dL — ABNORMAL LOW (ref 7–26)
CALCIUM: 9.2 mg/dL (ref 8.4–10.4)
CHLORIDE: 108 mmol/L (ref 98–109)
CO2: 23 mmol/L (ref 22–29)
CREATININE: 0.7 mg/dL (ref 0.60–1.10)
Glucose, Bld: 104 mg/dL (ref 70–140)
Potassium: 3.4 mmol/L — ABNORMAL LOW (ref 3.5–5.1)
Sodium: 141 mmol/L (ref 136–145)
TOTAL PROTEIN: 6.7 g/dL (ref 6.4–8.3)
Total Bilirubin: 1.9 mg/dL — ABNORMAL HIGH (ref 0.2–1.2)

## 2017-04-13 MED ORDER — SODIUM CHLORIDE 0.9 % IV SOLN
Freq: Once | INTRAVENOUS | Status: AC
  Administered 2017-04-13: 11:00:00 via INTRAVENOUS

## 2017-04-13 MED ORDER — HEPARIN SOD (PORK) LOCK FLUSH 100 UNIT/ML IV SOLN
500.0000 [IU] | Freq: Once | INTRAVENOUS | Status: AC | PRN
  Administered 2017-04-13: 500 [IU]
  Filled 2017-04-13: qty 5

## 2017-04-13 MED ORDER — SODIUM CHLORIDE 0.9% FLUSH
10.0000 mL | INTRAVENOUS | Status: DC | PRN
  Administered 2017-04-13: 10 mL
  Filled 2017-04-13: qty 10

## 2017-04-13 MED ORDER — ACYCLOVIR 800 MG PO TABS: 800.0000 mg | ORAL_TABLET | Freq: Two times a day (BID) | ORAL | 2 refills | Status: DC

## 2017-04-13 MED ORDER — PROCHLORPERAZINE MALEATE 10 MG PO TABS
ORAL_TABLET | ORAL | Status: AC
Start: 2017-04-13 — End: 2017-04-13
  Filled 2017-04-13: qty 1

## 2017-04-13 MED ORDER — DEXTROSE 5 % IV SOLN: 19.4000 mg/m2 | Freq: Once | INTRAVENOUS | Status: DC

## 2017-04-13 MED ORDER — PROCHLORPERAZINE MALEATE 10 MG PO TABS
10.0000 mg | ORAL_TABLET | Freq: Once | ORAL | Status: AC
  Administered 2017-04-13: 10 mg via ORAL

## 2017-04-13 MED ORDER — DEXTROSE 5 % IV SOLN
15.0000 mg/m2 | Freq: Once | INTRAVENOUS | Status: AC
  Administered 2017-04-13: 24 mg via INTRAVENOUS
  Filled 2017-04-13: qty 12

## 2017-04-13 MED ORDER — URSODIOL 300 MG PO CAPS: 300.0000 mg | ORAL_CAPSULE | Freq: Two times a day (BID) | ORAL | 0 refills | Status: DC

## 2017-04-13 MED ORDER — CLOTRIMAZOLE 10 MG MT TROC: 10.0000 mg | Freq: Every day | OROMUCOSAL | 0 refills | Status: AC

## 2017-04-13 MED ORDER — SODIUM CHLORIDE 0.9 % IV SOLN
Freq: Once | INTRAVENOUS | Status: AC
  Administered 2017-04-13: 10:00:00 via INTRAVENOUS

## 2017-04-13 NOTE — Progress Notes (Addendum)
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      10/10/2014 Miscellaneous    Peripheral blood chemistry and leukocytosis with total white count 78K, comprised of large plasma cells and his normocytic anemia. There is a myeloid left shift with previous surgical radium blasts. Flow cytometry showed 64% plasma cells      10/10/2014 Bone Marrow Biopsy    Markedly hypercellular marrow (95%), Atypical plasma cells comprise 57% of the cellularity. There was diminished multilineage in hematopoiesis with adequate maturation. Breasts less than 1%), no overt dysplasia of the myeloid or erythroid lineages.       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      05/07/2015 - 05/12/2015 Hospital Admission    [facility], [facility] ID workup was negative, EGD showed evidence of gastritis and duodenitis, no H. pylori or CMV.      05/20/2015 - 05/24/2015  Hospital Admission    [facility]      07/11/2015 Bone Marrow Biopsy    Post transplant 100 a bone marrow biopsy showed hypocellular marrow, 20%, no increase in plasma cells (2%) or other abnormalities.      08/22/2015 - 01/02/2016 Chemotherapy    MaintenanceCyborD (cytoxan 3079mm2 iv, bortezomib 1.5 mg/m, dexamethasone 40 mg, every 2 weeks, changed to Velcade maintenance after 4 months treatment      01/16/2016 - 04/19/2016 Chemotherapy    Maintenance Velcade 1.3 mg/m every 2 weeks      02/22/2016 Pathology Results    BONE MARROW: Diagnosis Bone Marrow, Aspirate,Biopsy, and Clot, right iliac - NORMOCELLULAR BONE MARROW FOR AGE WITH TRILINEAGE HEMATOPOIESIS. - PLASMACYTOSIS (PLASMA CELLS 12%). - SEE COMMENT. PERIPHERAL BLOOD: - OCCASIONAL CIRCULATING PLASMA CELLS.      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       04/18/2016 - 03/26/2017 Chemotherapy    Daratumumab per protocol  CyBorD every week, cytoxan was held after 04/24/2016 dye to cytopenia and infection  -discontinued due to Hep B flare      05/11/2016 - 05/16/2016 Hospital Admission    Healthcare-associated pneumonia       Hospital Admission    Admit date: 02/16/17 - 02/21/17  Admission diagnosis: Abnormal LFT's Additional comments: Assoc diagnoses: Hep B flare, transminitis, dehydration, fever, SIRS      04/06/2017 -  Chemotherapy    PENDING carfilzomib 56 mg/m2 on days 1 and 15 (except 2040m2 on C1D1 and C1D2) with  dexamethasone 20 mg on same days and pomalidomide 2 mg on days 1-21 of 28 day cycle         CURRENT THERAPY: Due to her recent hepatitis B flare,will stopDaratumumab and Velcade. Begancarfilzomib 56 mg/m2 on days 1 and 15(except 89m/m2 on C1D1 and C1D2)with dexamethasone 20 mg on same days and pomalidomide 2 mg on days 1-21 of 28 day cycleon 04/13/17   INTERVAL HISTORY: Ms. Sheryl [name] returns for follow up as scheduled prior to beginning pomalyst, dex, and carfilzomib. She was treated for influenza A last week. She completed tamiflu Friday 04/10/17. Last known fever Wednesday 04/08/17, no recent chills. No cough in 2 days. Denies chest pain or dyspnea. Vomited once on Friday 3/8; appetite is very low. Before the flu her appetite was normal. Anti-emetics helped. Remains "exhausted," resting often but able to be somewhat active at home over the weekend. She took potassium supplement as instructed. Does not drink ensure or boost, it causes her to vomit. She is out of actigall and acyclovir.   REVIEW OF SYSTEMS:   Constitutional: Denies fevers, chills (+) fatigue (+) weight loss (+) decreased appetite (+) last known fever 04/08/17 Eyes: Denies blurriness of vision Ears, nose, mouth, throat, and face: Denies mucositis or sore throat Respiratory: Denies cough, dyspnea or wheezes (+) no cough in 2 days  Cardiovascular: Denies palpitation, chest discomfort or lower extremity swelling Gastrointestinal:  Denies nausea, constipation, diarrhea, heartburn or change in bowel habits (+) last emesis 04/10/17, improved with anti-emetics  Skin: Denies abnormal skin rashes Lymphatics: Denies new lymphadenopathy or easy bruising Neurological:Denies numbness, tingling or new weaknesses Behavioral/Psych: Mood is stable, no new changes  All other systems were reviewed with the patient and are negative.  MEDICAL HISTORY:  Past Medical History:  Diagnosis Date  . Chills with fever    intermittently since d/c from hospital  . Dysuria-frequency syndrome    w/ pink urine  . GERD (gastroesophageal reflux disease)   . Hepatitis   . History of positive PPD    DX 2011--  CXR DONE NO EVIDENCE  . History of ureter stent   . Hydronephrosis, right   . Neuromuscular disorder ([facility])    legs numb intermittently  . 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[facility]   . Pneumonia   . Right ureteral stone   . Urosepsis 8/14   admitted  to [facility]    SURGICAL HISTORY: Past Surgical History:  Procedure Laterality Date  . CYSTOSCOPY W/ URETERAL STENT PLACEMENT Right 09/25/2012   Procedure: CYSTOSCOPY WITH RETROGRADE PYELOGRAM/URETERAL STENT PLACEMENT;  Surgeon: TAlexis Frock MD;  Location: [facility];  Service: Urology;  Laterality: Right;  . CYSTOSCOPY WITH RETROGRADE PYELOGRAM, URETEROSCOPY AND STENT PLACEMENT Right 10/15/2012   Procedure: CYSTOSCOPY WITH RETROGRADE PYELOGRAM, URETEROSCOPY AND REMOVAL STENT WITH  STENT PLACEMENT;  Surgeon: TAlexis Frock MD;  [facility]  Service: Urology;  Laterality: Right;  . ESOPHAGOGASTRODUODENOSCOPY (EGD) WITH PROPOFOL N/A 11/16/2014   Procedure: ESOPHAGOGASTRODUODENOSCOPY (EGD) WITH PROPOFOL;  Surgeon: DMilus Banister MD;  Location: [facility];  Service: Endoscopy;  Laterality: N/A;  . HOLMIUM LASER APPLICATION Right 99/52/8413  Procedure: HOLMIUM LASER APPLICATION;  Surgeon: TAlexis Frock MD;  [facility]  Service: Urology;  Laterality: Right;  . LIVER BIOPSY    . OTHER SURGICAL HISTORY Right    removal of ovarian cyst  . removal of uterine cyst     years ago  . RIGHT VATS W/ DRAINAGE PEURAL EFFUSION AND BX'S  10-30-2008  I have reviewed the social history and family history with the patient and they are unchanged from previous note.  ALLERGIES:  has No Known Allergies.  MEDICATIONS:  Current Outpatient Medications  Medication Sig Dispense Refill  . acyclovir (ZOVIRAX) 800 MG tablet Take 1 tablet (800 mg total) by mouth 2 (two) times daily. 60 tablet 2  . dexamethasone (DECADRON) 4 MG tablet Take 5 tablets (20 mg total) by mouth every 14 (fourteen) days. 20 tablet 2  . feeding supplement, ENSURE ENLIVE, (ENSURE ENLIVE) LIQD Take 237 mLs by mouth 2 (two) times daily between meals. 237 mL 12  . metFORMIN (GLUCOPHAGE) 500 MG tablet Take 1 tablet (500 mg total) by mouth 2 (two) times daily with a meal. 60 tablet 2  .  omeprazole (PRILOSEC) 20 MG capsule Take 1 capsule (20 mg total) by mouth daily. (Patient taking differently: Take 20 mg by mouth 2 (two) times daily before a meal. ) 30 capsule 5  . ondansetron (ZOFRAN) 8 MG tablet Take 1 tablet (8 mg total) by mouth every 8 (eight) hours as needed for nausea or vomiting. 30 tablet 1  . polyethylene glycol (MIRALAX / GLYCOLAX) packet Take 17 g by mouth daily as needed (constipation).     . pomalidomide (POMALYST) 2 MG capsule Take 1 capsule (2 mg total) by mouth daily. Take with water on days 1-21. Repeat every 28 days. 21 capsule 1  . potassium chloride SA (K-DUR,KLOR-CON) 20 MEQ tablet Take 1 tablet (20 mEq total) by mouth daily. 5 tablet 0  . prochlorperazine (COMPAZINE) 10 MG tablet Take 1 tablet (10 mg total) by mouth every 6 (six) hours as needed for nausea or vomiting. 30 tablet 0  . promethazine (PHENERGAN) 25 MG tablet Take 1 tablet (25 mg total) by mouth every 6 (six) hours as needed for nausea or vomiting. 30 tablet 0  . tenofovir (VIREAD) 300 MG tablet Take 1 tablet (300 mg total) by mouth daily. 30 tablet 6  . ursodiol (ACTIGALL) 300 MG capsule Take 1 capsule (300 mg total) by mouth 2 (two) times daily. 60 capsule 0  . clotrimazole (MYCELEX) 10 MG troche Take 1 tablet (10 mg total) by mouth 5 (five) times daily for 7 days. 35 tablet 0  . oseltamivir (TAMIFLU) 75 MG capsule Take 1 capsule (75 mg total) by mouth 2 (two) times daily. 10 capsule 0   No current facility-administered medications for this visit.    Facility-Administered Medications Ordered in Other Visits  Medication Dose Route Frequency Provider Last Rate Last Dose  . sodium chloride 0.9 % injection 10 mL  10 mL Intravenous PRN Truitt Merle, MD   10 mL at 12/11/15 1532  . sodium chloride 0.9 % injection 10 mL  10 mL Intravenous PRN Truitt Merle, MD   10 mL at 06/11/16 1505  . sodium chloride flush (NS) 0.9 % injection 10 mL  10 mL Intravenous PRN Truitt Merle, MD   10 mL at 10/09/15 1717     PHYSICAL EXAMINATION: ECOG PERFORMANCE STATUS: 2 - Symptomatic, <50% confined to bed  Vitals:   04/13/17 0909  BP: 107/77  Pulse: 94  Resp: 17  Temp: 98.1 F (36.7 C)  SpO2: 100%   Filed Weights   04/13/17 0909  Weight: 123 lb 9.6 oz (56.1 kg)    GENERAL:alert, no distress and comfortable SKIN: skin color, texture, turgor are normal, no rashes or significant lesions EYES: normal, Conjunctiva are pink and non-injected, sclera clear OROPHARYNX: (+) thrush  LYMPH:  no palpable cervical or supraclavicular lymphadenopathy LUNGS: clear to auscultation bilaterally with normal breathing effort HEART: regular rate & rhythm and no murmurs and no lower extremity edema ABDOMEN:abdomen soft, flat and normal bowel sounds (+) mild RUQ tenderness  Musculoskeletal:no cyanosis of digits and no clubbing  NEURO: alert & oriented x 3 with fluent speech, no focal motor/sensory deficits PAC without erythema   LABORATORY DATA:  I have reviewed the data as listed CBC Latest Ref Rng & Units 04/13/2017 04/06/2017 03/26/2017  WBC 3.9 - 10.3 K/uL 3.4(L) 3.4(L) 6.9  Hemoglobin 11.6 - 15.9 g/dL 11.2(L) 10.0(L) 12.4  Hematocrit 34.8 - 46.6 % 32.7(L) 29.9(L) 37.5  Platelets 145 - 400 K/uL 180 166 272     CMP Latest Ref Rng & Units 04/13/2017 04/06/2017 03/26/2017  Glucose 70 - 140 mg/dL 104 113 89  BUN 7 - 26 mg/dL 6(L) 11 15  Creatinine 0.60 - 1.10 mg/dL 0.70 0.83 0.72  Sodium 136 - 145 mmol/L 141 137 140  Potassium 3.5 - 5.1 mmol/L 3.4(L) 3.1(L) 4.3  Chloride 98 - 109 mmol/L 108 104 105  CO2 22 - 29 mmol/L _0 Calcium 8.4 - 10.4 mg/dL 9.2 8.9 10.8(H)  Total Protein 6.4 - 8.3 g/dL 6.7 6.5 7.4  Total Bilirubin 0.2 - 1.2 mg/dL 1.9(H) 2.3(H) 3.7(HH)  Alkaline Phos 40 - 150 U/L 93 112 193(H)  AST 5 - 34 U/L 39(H) 62(H) 76(H)  ALT 0 - 55 U/L _1 RADIOGRAPHIC STUDIES: I have personally reviewed the radiological images as listed and agreed with the findings in the report. No results  found.   ASSESSMENT & PLAN: 47 y.o.[place] woman, presented with anemia and leokocytosis   1. Influenza A (+)  -tested positive on 04/06/17, completed Tamiflu 3/8. Chest xray, urine, and blood cultures were negative. Afebrile >72 hours. No n/v in >48 hours. Her weight fluctuates and remains very fatigued, but her condition is improved overall. Will give IVF today with chemotherapy.   2. Hypokalemia, due to acute GI loss -She completed 1 20 mEq tablet x5 days, potassium remains low. I recommend she continue 1 tablet daily for now, and increase intake in her diet. Will monitor closely.   3. Transaminitis and hyperbilirubinemia, secondary to Hep B flare -LFTs continue to improve, tbili now 1.9. Continue tenofovir per ID. Will refill actigall for at least one more month or until LFTs remain normal.   4. 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 Strozier appears stable. Her condition has improved. She has mild thrush, will begin mycelex troches. VSS. Labs reviewed; WBC remains slightly low, but adequate to begin next line therapy. Will proceed with cycle 1 day 1 carfilzomib 20 mg/m2 with dexamethasone 20 mg on same days and pomalidomide 2 mg on days 1-21 of 28 day cycle. Refilled acyclovir.   PLAN:  -Refill acyclovir, actigall -Prescribe mycelex troches for thrush -Continue oral K 1 tablet daily  -Labs reviewed, begin pomalyst 2 mg daily days 1-21 of 28 day cycle; begin cycle 1 day 1 kyprolis and 15 mgm2 today and tomorrow, and dex 67m every 2 weeks  -Return next day for cycle 1 day 2 kyprolis  -return in 2 weeks with cycle 1 day 15 kyprolis  -500 cc NS today with chemo  All questions were answered. The patient knows to call the clinic with any problems, questions or concerns. No barriers to learning was detected. I spent 20 minutes counseling the patient face  to face. The total time spent in the appointment was 25 minutes and more than 50% was on counseling and review of  test results     Alla Feeling, NP 04/13/17   Addendum  I have seen the patient, examined her. I agree with the assessment and and plan and have edited the notes.   Ms Callies has recovered well from her influenza A infection last week, although still quite fatigued and has low appetite.  Lab reviewed, her bilirubin has come down to 1.9 today, liver enzymes are close to normal.  I recommend her to start Cycle 1 Kyprolis (25% dose reduction due to her hyperbilirubinemia), Pomalyst, and weekly dexamethasone today.  Side effects reviewed with her again, especially cytopenia and risk of infection, we will see her back in 2 weeks for C1D15 treatment.   Truitt Merle  04/13/2017

## 2017-04-13 NOTE — Progress Notes (Signed)
Per Dr. Burr Medico, will dose reduce Kyprolis by 25% for C1D1 & C1D2. Will decide next weeks dose based on Tbili at that time. [name], Pharm.D., CPP 04/13/2017@10 :39 AM

## 2017-04-13 NOTE — Patient Instructions (Signed)
[place] Discharge Instructions for Patients Receiving Chemotherapy  Today you received the following chemotherapy agents Kyprolis  To help prevent nausea and vomiting after your treatment, we encourage you to take your nausea medication as directed If you develop nausea and vomiting that is not controlled by your nausea medication, call the clinic.   BELOW ARE SYMPTOMS THAT SHOULD BE REPORTED IMMEDIATELY:  *FEVER GREATER THAN 100.5 F  *CHILLS WITH OR WITHOUT FEVER  NAUSEA AND VOMITING THAT IS NOT CONTROLLED WITH YOUR NAUSEA MEDICATION  *UNUSUAL SHORTNESS OF BREATH  *UNUSUAL BRUISING OR BLEEDING  TENDERNESS IN MOUTH AND THROAT WITH OR WITHOUT PRESENCE OF ULCERS  *URINARY PROBLEMS  *BOWEL PROBLEMS  UNUSUAL RASH Items with * indicate a potential emergency and should be followed up as soon as possible.  Feel free to call the clinic should you have any questions or concerns. The clinic phone number is [phone].  Please show the [facility] at check-in to the Emergency Department and triage nurse.  Carfilzomib (Kyprolis) injection What is this medicine? CARFILZOMIB (kar FILZ oh mib) targets a specific protein within cancer cells and stops the cancer cells from growing. It is used to treat multiple myeloma. This medicine may be used for other purposes; ask your health care provider or pharmacist if you have questions. COMMON BRAND NAME(S): KYPROLIS What should I tell my health care provider before I take this medicine? They need to know if you have any of these conditions: -heart disease -history of blood clots -irregular heartbeat -kidney disease -liver disease -lung or breathing disease -an unusual or allergic reaction to carfilzomib, or other medicines, foods, dyes, or preservatives -pregnant or trying to get pregnant -breast-feeding How should I use this medicine? This medicine is for injection or infusion into a vein. It is given by a  health care professional in a hospital or clinic setting. Talk to your pediatrician regarding the use of this medicine in children. Special care may be needed. Overdosage: If you think you have taken too much of this medicine contact a poison control center or emergency room at once. NOTE: This medicine is only for you. Do not share this medicine with others. What if I miss a dose? It is important not to miss your dose. Call your doctor or health care professional if you are unable to keep an appointment. What may interact with this medicine? Interactions are not expected. Give your health care provider a list of all the medicines, herbs, non-prescription drugs, or dietary supplements you use. Also tell them if you smoke, drink alcohol, or use illegal drugs. Some items may interact with your medicine. This list may not describe all possible interactions. Give your health care provider a list of all the medicines, herbs, non-prescription drugs, or dietary supplements you use. Also tell them if you smoke, drink alcohol, or use illegal drugs. Some items may interact with your medicine. What should I watch for while using this medicine? Your condition will be monitored carefully while you are receiving this medicine. Report any side effects. Continue your course of treatment even though you feel ill unless your doctor tells you to stop. You may need blood work done while you are taking this medicine. Do not become pregnant while taking this medicine or for at least 30 days after stopping it. Women should inform their doctor if they wish to become pregnant or think they might be pregnant. There is a potential for serious side effects to an unborn child. Men  should not father a child while taking this medicine and for 90 days after stopping it. Talk to your health care professional or pharmacist for more information. Do not breast-feed an infant while taking this medicine. Check with your doctor or health  care professional if you get an attack of severe diarrhea, nausea and vomiting, or if you sweat a lot. The loss of too much body fluid can make it dangerous for you to take this medicine. You may get dizzy. Do not drive, use machinery, or do anything that needs mental alertness until you know how this medicine affects you. Do not stand or sit up quickly, especially if you are an older patient. This reduces the risk of dizzy or fainting spells. What side effects may I notice from receiving this medicine? Side effects that you should report to your doctor or health care professional as soon as possible: -allergic reactions like skin rash, itching or hives, swelling of the face, lips, or tongue -confusion -dizziness -feeling faint or lightheaded -fever or chills -palpitations -seizures -signs and symptoms of bleeding such as bloody or black, tarry stools; red or dark-brown urine; spitting up blood or brown material that looks like coffee grounds; red spots on the skin; unusual bruising or bleeding including from the eye, gums, or nose -signs and symptoms of a blood clot such as breathing problems; changes in vision; chest pain; severe, sudden headache; pain, swelling, warmth in the leg; trouble speaking; sudden numbness or weakness of the face, arm or leg -signs and symptoms of kidney injury like trouble passing urine or change in the amount of urine -signs and symptoms of liver injury like dark yellow or brown urine; general ill feeling or flu-like symptoms; light-colored stools; loss of appetite; nausea; right upper belly pain; unusually weak or tired; yellowing of the eyes or skin Side effects that usually do not require medical attention (report to your doctor or health care professional if they continue or are bothersome): -back pain -cough -diarrhea -headache -muscle cramps -vomiting This list may not describe all possible side effects. Call your doctor for medical advice about side effects.  You may report side effects to FDA at 1-800-FDA-1088. Where should I keep my medicine? This drug is given in a hospital or clinic and will not be stored at home. NOTE: This sheet is a summary. It may not cover all possible information. If you have questions about this medicine, talk to your doctor, pharmacist, or health care provider.  2018 Elsevier/Gold Standard (2015-02-22 13:39:23)     

## 2017-04-14 ENCOUNTER — Inpatient Hospital Stay: Payer: Medicaid Other

## 2017-04-14 ENCOUNTER — Other Ambulatory Visit: Payer: Self-pay | Admitting: Nurse Practitioner

## 2017-04-14 VITALS — BP 106/68 | HR 81 | Temp 97.7°F | Resp 18

## 2017-04-14 DIAGNOSIS — C9010 Plasma cell leukemia not having achieved remission: Secondary | ICD-10-CM

## 2017-04-14 DIAGNOSIS — E876 Hypokalemia: Secondary | ICD-10-CM

## 2017-04-14 DIAGNOSIS — Z5112 Encounter for antineoplastic immunotherapy: Secondary | ICD-10-CM | POA: Diagnosis not present

## 2017-04-14 MED ORDER — SODIUM CHLORIDE 0.9 % IV SOLN
Freq: Once | INTRAVENOUS | Status: AC
  Administered 2017-04-14: 13:00:00 via INTRAVENOUS

## 2017-04-14 MED ORDER — HEPARIN SOD (PORK) LOCK FLUSH 100 UNIT/ML IV SOLN
500.0000 [IU] | Freq: Once | INTRAVENOUS | Status: AC | PRN
  Administered 2017-04-14: 500 [IU]
  Filled 2017-04-14: qty 5

## 2017-04-14 MED ORDER — PROCHLORPERAZINE MALEATE 10 MG PO TABS
ORAL_TABLET | ORAL | Status: AC
  Filled 2017-04-14: qty 1

## 2017-04-14 MED ORDER — POTASSIUM CHLORIDE CRYS ER 20 MEQ PO TBCR: 20.0000 meq | EXTENDED_RELEASE_TABLET | Freq: Every day | ORAL | 0 refills | Status: DC

## 2017-04-14 MED ORDER — DEXTROSE 5 % IV SOLN
15.0000 mg/m2 | Freq: Once | INTRAVENOUS | Status: AC
  Administered 2017-04-14: 24 mg via INTRAVENOUS
  Filled 2017-04-14: qty 12

## 2017-04-14 MED ORDER — SODIUM CHLORIDE 0.9 % IV SOLN
Freq: Once | INTRAVENOUS | Status: AC
  Administered 2017-04-14: 14:00:00 via INTRAVENOUS

## 2017-04-14 MED ORDER — SODIUM CHLORIDE 0.9% FLUSH
10.0000 mL | INTRAVENOUS | Status: DC | PRN
  Administered 2017-04-14: 10 mL
  Filled 2017-04-14: qty 10

## 2017-04-14 MED ORDER — PROCHLORPERAZINE MALEATE 10 MG PO TABS
10.0000 mg | ORAL_TABLET | Freq: Once | ORAL | Status: AC
  Administered 2017-04-14: 10 mg via ORAL

## 2017-04-14 NOTE — Patient Instructions (Signed)
[facility] Discharge Instructions for Patients Receiving Chemotherapy  Today you received the following chemotherapy agents KYPROLIS To help prevent nausea and vomiting after your treatment, we encourage you to take your nausea medication as prescribed.  If you develop nausea and vomiting that is not controlled by your nausea medication, call the clinic.   BELOW ARE SYMPTOMS THAT SHOULD BE REPORTED IMMEDIATELY:  *FEVER GREATER THAN 100.5 F  *CHILLS WITH OR WITHOUT FEVER  NAUSEA AND VOMITING THAT IS NOT CONTROLLED WITH YOUR NAUSEA MEDICATION  *UNUSUAL SHORTNESS OF BREATH  *UNUSUAL BRUISING OR BLEEDING  TENDERNESS IN MOUTH AND THROAT WITH OR WITHOUT PRESENCE OF ULCERS  *URINARY PROBLEMS  *BOWEL PROBLEMS  UNUSUAL RASH Items with * indicate a potential emergency and should be followed up as soon as possible.  Feel free to call the clinic should you have any questions or concerns. The clinic phone number is [phone].  Please show the [facility] at check-in to the Emergency Department and triage nurse.

## 2017-04-16 ENCOUNTER — Telehealth: Payer: Self-pay | Admitting: *Deleted

## 2017-04-16 NOTE — Telephone Encounter (Signed)
TCT patient to follow up on 1st time kyprolis on 04/13/17. No answer but was able to leave message for pt to call back with any questions or concerns.  Next appt is 04/27/17

## 2017-04-16 NOTE — Telephone Encounter (Signed)
-----   Message from Murvin Natal, RN sent at 04/13/2017  3:39 PM EDT ----- Regarding: Pt of Dr. Burr Medico first time chemo follow up call Pt of Dr. Burr Medico first time Kyprolis follow up call.  Pt tolerated treatment well.

## 2017-04-20 ENCOUNTER — Ambulatory Visit: Payer: Medicaid Other

## 2017-04-20 ENCOUNTER — Ambulatory Visit: Payer: Medicaid Other | Admitting: Nurse Practitioner

## 2017-04-20 ENCOUNTER — Other Ambulatory Visit: Payer: Medicaid Other

## 2017-04-27 ENCOUNTER — Inpatient Hospital Stay (HOSPITAL_BASED_OUTPATIENT_CLINIC_OR_DEPARTMENT_OTHER): Payer: Medicaid Other | Admitting: Hematology

## 2017-04-27 ENCOUNTER — Telehealth: Payer: Self-pay | Admitting: Hematology

## 2017-04-27 ENCOUNTER — Encounter: Payer: Self-pay | Admitting: Hematology

## 2017-04-27 ENCOUNTER — Inpatient Hospital Stay: Payer: Medicaid Other

## 2017-04-27 VITALS — BP 120/80 | HR 87 | Temp 99.2°F | Resp 18 | Ht 63.0 in | Wt 132.6 lb

## 2017-04-27 DIAGNOSIS — K219 Gastro-esophageal reflux disease without esophagitis: Secondary | ICD-10-CM

## 2017-04-27 DIAGNOSIS — M542 Cervicalgia: Secondary | ICD-10-CM

## 2017-04-27 DIAGNOSIS — B1910 Unspecified viral hepatitis B without hepatic coma: Secondary | ICD-10-CM | POA: Diagnosis not present

## 2017-04-27 DIAGNOSIS — C9012 Plasma cell leukemia in relapse: Secondary | ICD-10-CM | POA: Diagnosis not present

## 2017-04-27 DIAGNOSIS — C9010 Plasma cell leukemia not having achieved remission: Secondary | ICD-10-CM

## 2017-04-27 DIAGNOSIS — B181 Chronic viral hepatitis B without delta-agent: Secondary | ICD-10-CM

## 2017-04-27 DIAGNOSIS — Z95828 Presence of other vascular implants and grafts: Secondary | ICD-10-CM

## 2017-04-27 DIAGNOSIS — E099 Drug or chemical induced diabetes mellitus without complications: Secondary | ICD-10-CM | POA: Diagnosis not present

## 2017-04-27 DIAGNOSIS — R740 Nonspecific elevation of levels of transaminase and lactic acid dehydrogenase [LDH]: Secondary | ICD-10-CM | POA: Diagnosis not present

## 2017-04-27 DIAGNOSIS — D649 Anemia, unspecified: Secondary | ICD-10-CM

## 2017-04-27 DIAGNOSIS — R51 Headache: Secondary | ICD-10-CM | POA: Diagnosis not present

## 2017-04-27 DIAGNOSIS — Z5112 Encounter for antineoplastic immunotherapy: Secondary | ICD-10-CM | POA: Diagnosis not present

## 2017-04-27 LAB — CBC WITH DIFFERENTIAL/PLATELET
Basophils Absolute: 0 10*3/uL (ref 0.0–0.1)
Basophils Relative: 0 %
EOS ABS: 0.1 10*3/uL (ref 0.0–0.5)
Eosinophils Relative: 2 %
HEMATOCRIT: 28.6 % — AB (ref 34.8–46.6)
HEMOGLOBIN: 9.2 g/dL — AB (ref 11.6–15.9)
LYMPHS ABS: 1.5 10*3/uL (ref 0.9–3.3)
LYMPHS PCT: 27 %
MCH: 31.2 pg (ref 25.1–34.0)
MCHC: 32.2 g/dL (ref 31.5–36.0)
MCV: 96.9 fL (ref 79.5–101.0)
Monocytes Absolute: 0.7 10*3/uL (ref 0.1–0.9)
Monocytes Relative: 12 %
NEUTROS ABS: 3.2 10*3/uL (ref 1.5–6.5)
NEUTROS PCT: 59 %
Platelets: 127 10*3/uL — ABNORMAL LOW (ref 145–400)
RBC: 2.95 MIL/uL — AB (ref 3.70–5.45)
RDW: 15.6 % — ABNORMAL HIGH (ref 11.2–14.5)
WBC: 5.4 10*3/uL (ref 3.9–10.3)

## 2017-04-27 LAB — COMPREHENSIVE METABOLIC PANEL
ALT: 8 U/L (ref 0–55)
ANION GAP: 6 (ref 3–11)
AST: 26 U/L (ref 5–34)
Albumin: 2.8 g/dL — ABNORMAL LOW (ref 3.5–5.0)
Alkaline Phosphatase: 136 U/L (ref 40–150)
BUN: 9 mg/dL (ref 7–26)
CHLORIDE: 109 mmol/L (ref 98–109)
CO2: 25 mmol/L (ref 22–29)
Calcium: 8.8 mg/dL (ref 8.4–10.4)
Creatinine, Ser: 0.73 mg/dL (ref 0.60–1.10)
Glucose, Bld: 128 mg/dL (ref 70–140)
Potassium: 3.8 mmol/L (ref 3.5–5.1)
Sodium: 140 mmol/L (ref 136–145)
Total Bilirubin: 0.9 mg/dL (ref 0.2–1.2)
Total Protein: 6.1 g/dL — ABNORMAL LOW (ref 6.4–8.3)

## 2017-04-27 LAB — PREGNANCY, URINE: PREG TEST UR: NEGATIVE

## 2017-04-27 MED ORDER — PROCHLORPERAZINE MALEATE 10 MG PO TABS
ORAL_TABLET | ORAL | Status: AC
  Filled 2017-04-27: qty 1

## 2017-04-27 MED ORDER — SODIUM CHLORIDE 0.9 % IV SOLN
Freq: Once | INTRAVENOUS | Status: AC
  Administered 2017-04-27: 14:00:00 via INTRAVENOUS

## 2017-04-27 MED ORDER — SODIUM CHLORIDE 0.9% FLUSH
10.0000 mL | INTRAVENOUS | Status: DC | PRN
  Administered 2017-04-27: 10 mL
  Filled 2017-04-27: qty 10

## 2017-04-27 MED ORDER — HEPARIN SOD (PORK) LOCK FLUSH 100 UNIT/ML IV SOLN
500.0000 [IU] | Freq: Once | INTRAVENOUS | Status: AC | PRN
  Administered 2017-04-27: 500 [IU]
  Filled 2017-04-27: qty 5

## 2017-04-27 MED ORDER — CARFILZOMIB CHEMO INJECTION 60 MG
56.0000 mg/m2 | Freq: Once | INTRAVENOUS | Status: AC
  Administered 2017-04-27: 90 mg via INTRAVENOUS
  Filled 2017-04-27: qty 15

## 2017-04-27 MED ORDER — CARFILZOMIB CHEMO INJECTION 60 MG: 56.0000 mg/m2 | Freq: Once | INTRAVENOUS | Status: DC

## 2017-04-27 MED ORDER — SODIUM CHLORIDE 0.9 % IJ SOLN
10.0000 mL | INTRAMUSCULAR | Status: DC | PRN
  Administered 2017-04-27: 10 mL via INTRAVENOUS
  Filled 2017-04-27: qty 10

## 2017-04-27 MED ORDER — PROCHLORPERAZINE MALEATE 10 MG PO TABS
10.0000 mg | ORAL_TABLET | Freq: Once | ORAL | Status: AC
  Administered 2017-04-27: 10 mg via ORAL

## 2017-04-27 NOTE — Telephone Encounter (Signed)
Next appointment scheduled per 3/25 los

## 2017-04-27 NOTE — Progress Notes (Signed)
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      10/10/2014 Miscellaneous    Peripheral blood chemistry and leukocytosis with total white count 78K, comprised of large plasma cells and his normocytic anemia. There is a myeloid left shift with previous surgical radium blasts. Flow cytometry showed 64% plasma cells      10/10/2014 Bone Marrow Biopsy    Markedly hypercellular marrow (95%), Atypical plasma cells comprise 57% of the cellularity. There was diminished multilineage in hematopoiesis with adequate maturation. Breasts less than 1%), no overt dysplasia of the myeloid or erythroid lineages.       10/10/2014 Initial Diagnosis    Plasma cell leukemia      10/13/2014 - 02/22/2015 Chemotherapy    CyborD (cytoxan 341m/m2 iv, bortezomib 1.5 mg/m, dexamethasone 40 mg, weekly every 28 days, bortezomib and dexamethasone was given twice weekly for 2 weeks during the first cycle)      04/04/2015 Bone Marrow Transplant    [facility] Her transplant course was complicated by sepsis from Escherichia coli bacteremia and associated colitis, she was discharged home on 04/27/2015.      05/07/2015 - 05/12/2015 Hospital Admission    [facility], [facility] and [facility] pain. ID workup was negative, EGD showed evidence of gastritis and  duodenitis, no H. pylori or CMV.      05/20/2015 - 05/24/2015 Hospital Admission    [facility]      07/11/2015 Bone Marrow Biopsy    Post transplant 100 a bone marrow biopsy showed hypocellular marrow, 20%, no increase in plasma cells (2%) or other abnormalities.      08/22/2015 - 01/02/2016 Chemotherapy    MaintenanceCyborD (cytoxan 3067mm2 iv, bortezomib 1.5 mg/m, dexamethasone 40 mg, every 2 weeks, changed to Velcade maintenance after 4 months treatment      01/16/2016 - 04/19/2016 Chemotherapy    Maintenance Velcade 1.3 mg/m every 2 weeks      02/22/2016 Pathology Results    BONE MARROW: Diagnosis Bone Marrow, Aspirate,Biopsy, and Clot, right iliac - NORMOCELLULAR BONE MARROW FOR AGE WITH TRILINEAGE HEMATOPOIESIS. - PLASMACYTOSIS (PLASMA CELLS 12%). - SEE COMMENT. PERIPHERAL BLOOD: - OCCASIONAL CIRCULATING PLASMA CELLS.      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       04/18/2016 - 03/26/2017 Chemotherapy    Daratumumab per protocol  CyBorD every week, cytoxan was held after 04/24/2016 dye to cytopenia and infection  -discontinued due to Hep B flare      05/11/2016 - 05/16/2016 Hospital Admission    Healthcare-associated pneumonia       Hospital Admission    Admit date: 02/16/17 - 02/21/17  Admission diagnosis: Abnormal LFT's Additional comments: Assoc diagnoses: Hep B flare, transminitis, dehydration, fever, SIRS      04/06/2017 -  Chemotherapy    PENDING  carfilzomib 56 mg/m2 on days 1 and 15 (except 71m/m2 on C1D1 and C1D2) with dexamethasone 20 mg on same days and pomalidomide 2 mg on days 1-21 of 28 day cycle           HISTORY OF PRESENTING ILLNESS:  Sheryl [name] 47y.o. female is here because of recently diagnosed plasma cell leukemia. [facility] and is here to establish her local oncological  care with [place] she is accompanied to the clinic by her daughter and interpreter.  She presented to our hospital with worsening dyspnea, fatigue, cough, subjective fevers and chills, and was admitted on 09/14/2014. She was found to have allergy WBC 54.1K, hemoglobin 8.1, plt 310, she was treated with symptom management, and was discharged home on 09/17/2014, with a plan to follow-up with hematology. She represents to emergency room on 10/07/2014, was found to have white count of 78K, and worsening anemia with hemoglobin 6. She was seen by my partner Dr. EJonette Evaand plasma cell leukemia was suspected. [facility] Bone marrow biopsy was done, which confirmed plasma cell leukemia, and she started chemotherapy with Velcade, Cytoxan and dexamethasone. She received weekly dose twice, last dose on 10/20/2014. She was discharged to home afterwards. She had a mediport placed during her hospitalization.  She has moderate fatigue, low appetite, she lost about 13 lbs in the past few months. She has moderate abdominal pain, 5-6/10, persistent, she does not take any pain meds.   I reviewed her medical records extensively, and discussed her case with Dr. EJorje Guildand [place]program coordinator.  CURRENT THERAPY:  carfilzomib 56 mg/m2 on days 1 and 15(except 210mm2 on C1D1 and C1D2)with dexamethasone 20 mg on same days and pomalidomide 2 mg on days 1-21 of 28 day cycleon 04/13/17   INTERIM HISTORY:   Sheryl [name] is here for follow up of her acute plasma cell Leukemia and elevated LFTs. She presents today accompanied by her translator. She reports she is doing well and has recovered after her first treatment of carfizomib. She states she developed neck stiffness after the 1st treatment that is improving and intermittent headaches. She endorses a good appetite and good energy level.   On review of  systems, pt denies any other complaints at this time. Pertinent positives are listed and detailed within the above HPI.   MEDICAL HISTORY:  Past Medical History:  Diagnosis Date  . Chills with fever    intermittently since d/c from hospital  . Dysuria-frequency syndrome    w/ pink urine  . GERD (gastroesophageal reflux disease)   . Hepatitis   . History of positive PPD    DX 2011--  CXR DONE NO EVIDENCE  . History of ureter stent   . Hydronephrosis, right   . Neuromuscular disorder ([facility])    legs numb intermittently  . 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[place]  . Pneumonia   . Right ureteral stone   . Urosepsis 8/14   ad[facility]    SURGICAL HISTORY: Past Surgical History:  Procedure Laterality Date  . CYSTOSCOPY W/ URETERAL STENT PLACEMENT Right 09/25/2012   Procedure: CYSTOSCOPY WITH RETROGRADE PYELOGRAM/URETERAL STENT PLACEMENT;  Surgeon: ThAlexis FrockMD;  Location: [facility];  Service: Urology;  Laterality: Right;  . CYSTOSCOPY WITH RETROGRADE PYELOGRAM, URETEROSCOPY AND STENT PLACEMENT Right 10/15/2012   Procedure: CYSTOSCOPY WITH RETROGRADE PYELOGRAM, URETEROSCOPY AND REMOVAL  STENT WITH  STENT PLACEMENT;  Surgeon: Alexis Frock, MD;  [facility];  Service: Urology;  Laterality: Right;  . ESOPHAGOGASTRODUODENOSCOPY (EGD) WITH PROPOFOL N/A 11/16/2014   Procedure: ESOPHAGOGASTRODUODENOSCOPY (EGD) WITH PROPOFOL;  Surgeon: Milus Banister, MD;  Location: [facility];  Service: Endoscopy;  Laterality: N/A;  . HOLMIUM LASER APPLICATION Right 2/75/1700   Procedure: HOLMIUM LASER APPLICATION;  Surgeon: Alexis Frock, MD;  Location: [facility];  Service: Urology;  Laterality: Right;  . LIVER BIOPSY    . OTHER SURGICAL HISTORY Right    removal of ovarian cyst  . removal of uterine cyst     years ago  . RIGHT VATS W/ DRAINAGE PEURAL EFFUSION AND BX'S  10-30-2008    SOCIAL HISTORY: Social History   Socioeconomic History  . Marital status: Single     Spouse name: Not on file  . Number of children: 3  . Years of education: Not on file  . Highest education level: Not on file  Occupational History  . Not on file  Social Needs  . Financial resource strain: Not on file  . Food insecurity:    Worry: Not on file    Inability: Not on file  . Transportation needs:    Medical: Not on file    Non-medical: Not on file  Tobacco Use  . Smoking status: Never Smoker  . Smokeless tobacco: Never Used  Substance and Sexual Activity  . Alcohol use: No    Alcohol/week: 0.0 oz  . Drug use: No  . Sexual activity: Not Currently    Birth control/protection: Abstinence  Lifestyle  . Physical activity:    Days per week: Not on file    Minutes per session: Not on file  . Stress: Not on file  Relationships  . Social connections:    Talks on phone: Not on file    Gets together: Not on file    Attends religious service: Not on file    Active member of club or organization: Not on file    Attends meetings of clubs or organizations: Not on file    Relationship status: Not on file  Other Topics Concern  . Not on file  Social History Narrative  . Not on file    FAMILY HISTORY: Family History  Problem Relation Age of Onset  . Stomach cancer Mother   . Lung disease Father   . Asthma Father     ALLERGIES:  has No Known Allergies.  MEDICATIONS:  Current Outpatient Medications  Medication Sig Dispense Refill  . acyclovir (ZOVIRAX) 800 MG tablet Take 1 tablet (800 mg total) by mouth 2 (two) times daily. 60 tablet 2  . feeding supplement, ENSURE ENLIVE, (ENSURE ENLIVE) LIQD Take 237 mLs by mouth 2 (two) times daily between meals. 237 mL 12  . metFORMIN (GLUCOPHAGE) 500 MG tablet Take 1 tablet (500 mg total) by mouth 2 (two) times daily with a meal. 60 tablet 2  . omeprazole (PRILOSEC) 20 MG capsule Take 1 capsule (20 mg total) by mouth daily. (Patient taking differently: Take 20 mg by mouth 2 (two) times daily before a meal. ) 30 capsule 5    . ondansetron (ZOFRAN) 8 MG tablet Take 1 tablet (8 mg total) by mouth every 8 (eight) hours as needed for nausea or vomiting. 30 tablet 1  . polyethylene glycol (MIRALAX / GLYCOLAX) packet Take 17 g by mouth daily as needed (constipation).     . pomalidomide (POMALYST) 2 MG capsule  Take 1 capsule (2 mg total) by mouth daily. Take with water on days 1-21. Repeat every 28 days. 21 capsule 1  . potassium chloride SA (K-DUR,KLOR-CON) 20 MEQ tablet Take 1 tablet (20 mEq total) by mouth daily. 30 tablet 0  . prochlorperazine (COMPAZINE) 10 MG tablet Take 1 tablet (10 mg total) by mouth every 6 (six) hours as needed for nausea or vomiting. 30 tablet 0  . promethazine (PHENERGAN) 25 MG tablet Take 1 tablet (25 mg total) by mouth every 6 (six) hours as needed for nausea or vomiting. 30 tablet 0  . tenofovir (VIREAD) 300 MG tablet Take 1 tablet (300 mg total) by mouth daily. 30 tablet 6  . ursodiol (ACTIGALL) 300 MG capsule Take 1 capsule (300 mg total) by mouth 2 (two) times daily. 60 capsule 0  . dexamethasone (DECADRON) 4 MG tablet Take 5 tablets (20 mg total) by mouth every 14 (fourteen) days. (Patient not taking: Reported on 04/27/2017) 20 tablet 2   No current facility-administered medications for this visit.    Facility-Administered Medications Ordered in Other Visits  Medication Dose Route Frequency Provider Last Rate Last Dose  . sodium chloride 0.9 % injection 10 mL  10 mL Intravenous PRN Truitt Merle, MD   10 mL at 12/11/15 1532  . sodium chloride 0.9 % injection 10 mL  10 mL Intravenous PRN Truitt Merle, MD   10 mL at 06/11/16 1505  . sodium chloride flush (NS) 0.9 % injection 10 mL  10 mL Intravenous PRN Truitt Merle, MD   10 mL at 10/09/15 1717    REVIEW OF SYSTEMS:   Constitutional: no abnormal night sweats (+) good appetite, energy improved  Eyes: Denies blurriness of vision, double vision or watery eyes.  Ears, nose, mouth, throat, and face: Denies mucositis  Respiratory: Denies, dyspnea or  wheezes  Cardiovascular: Denies palpitation, chest discomfort or lower extremity swelling Gastrointestinal:  Denies nausea, heartburn, reports regular bowel/bladder Skin: Denies abnormal skin rashes Musculoskeletal: (+) Neck, shoulder and upper arm pain.  Lymphatics: Denies new lymphadenopathy or easy bruising Neurological:Denies numbness, tingling or new weaknesses Behavioral/Psych: Mood is stable, no new changes  All other systems were reviewed with the patient and are negative.  PHYSICAL EXAMINATION:   ECOG PERFORMANCE STATUS: 1-2  Vitals:   04/27/17 1301  BP: 120/80  Pulse: 87  Resp: 18  Temp: 99.2 F (37.3 C)  TempSrc: Oral  SpO2: 100%  Weight: 132 lb 9.6 oz (60.1 kg)  Height: '5\' 3"'  (1.6 m)    GENERAL: She was wrapped with multiple blankets, not in respiratory distress. SKIN: skin color, texture, turgor are normal, no rashes or significant lesions EYES: normal, conjunctiva are pink and non-injected OROPHARYNX:no exudate, no erythema and lips, buccal mucosa NECK: supple, thyroid normal size, non-tender, without nodularity LYMPH:  no palpable lymphadenopathy in the cervical, axillary or inguinal LUNGS: clear to auscultation and percussion with normal breathing effort HEART: regular rate & rhythm and no murmurs and no lower extremity edema ABDOMEN:abdomen soft, moderate tenderness in the right upper quadrant of abdomen, no rebound pain, no Murphy sign, no hepatomegaly. Musculoskeletal:no cyanosis of digits and no clubbing  PSYCH: alert & oriented x 3 with fluent speech NEURO: no focal motor/sensory deficits  LABORATORY DATA:  I have reviewed the data as listed CBC Latest Ref Rng & Units 04/27/2017 04/13/2017 04/06/2017  WBC 3.9 - 10.3 K/uL 5.4 3.4(L) 3.4(L)  Hemoglobin 11.6 - 15.9 g/dL 9.2(L) 11.2(L) 10.0(L)  Hematocrit 34.8 - 46.6 % 28.6(L) 32.7(L) 29.9(L)  Platelets 145 - 400 K/uL 127(L) 180 166    CMP Latest Ref Rng & Units 04/27/2017 04/13/2017 04/06/2017  Glucose 70 -  140 mg/dL 128 104 113  BUN 7 - 26 mg/dL 9 6(L) 11  Creatinine 0.60 - 1.10 mg/dL 0.73 0.70 0.83  Sodium 136 - 145 mmol/L 140 141 137  Potassium 3.5 - 5.1 mmol/L 3.8 3.4(L) 3.1(L)  Chloride 98 - 109 mmol/L 109 108 104  CO2 22 - 29 mmol/L '25 23 22  ' Calcium 8.4 - 10.4 mg/dL 8.8 9.2 8.9  Total Protein 6.4 - 8.3 g/dL 6.1(L) 6.7 6.5  Total Bilirubin 0.2 - 1.2 mg/dL 0.9 1.9(H) 2.3(H)  Alkaline Phos 40 - 150 U/L 136 93 112  AST 5 - 34 U/L 26 39(H) 62(H)  ALT 0 - 55 U/L '8 10 16     ' SPEP M-protein  09/15/2014: 4.2 10/08/14: 4.6 12/08/2014: 0.2 02/15/2015: not sufficient sample for test   09/11/2015: not observed 10/09/2015: not det   11/06/2015: not det  12/25/2015: not det  02/13/2016: 0.3 03/12/2016: 0.8 04/09/2016: 0.6 05/08/16: 0.1 06/04/16: 0.1 07/02/2016: 0.2 07/09/2016: 0.2 (dara specific IFE negative) 08/05/16: Not observed  09/01/16: Not observed 09/29/16: 0.1(dara specific IFE negative) 10/27/16: Not observed 11/24/16 : Not observed 12/23/16: 0.1 (Dara specific IFE showed IgG monoclonal protein with lambda light chain) 01/21/2017: 0.1 (Dara specific IFE showed IgG monoclonal protein with lambda light chain) 03/26/17: 0.2   IgG mg/dl 11/03/2014: 3150  12/08/2014:  759 02/14/2014: 860 09/11/2015: 8295 10/09/2015: 1457 11/06/2015: 1504 12/25/2015: 1400 02/13/2016: 1543 03/12/2016: 1860 04/09/2016: 1620 05/08/16: 749 06/04/16: 751 5/30/20187: 796 07/09/2016: 701 08/05/16: 678 09/01/16: 650 09/29/16: 727 10/27/16: 634 11/24/16: 685  12/23/16: 714 01/21/2017: 709 03/26/17: 874   Kappa/lambda light chains levels and ration  11/03/14: 0.75, 152, 0.00 12/22/2014: 1.10, 2.60, 0.42 02/15/2015: 9.59,14.35, 0.67 09/11/2015: 2.44, 2.72, 0.90 10/09/2015: 3.09, 3.49, 0.89 11/06/2015: 2.30, 2.62, 0.88 12/25/2015: 2.51, 3.0, 0.84 02/13/2016: 2.64, 15.3, 0.17 03/12/2016: 2.27, 45.5, 0.05 04/09/2016: 3.33, 45.5, 0.07 05/08/2016: 0.49, 1.21, 0.40 06/04/2016: 0.83, 1.11, 0.75 07/02/16: 0.51, 1.43, 0.36 07/23/16: 0.68, 1.02,  0.67 08/05/16: 0.62, 1.09, 0.57 09/01/16: 0.80, 1.21, 0.66 09/29/16: 0.44, 1.07, 0.41 10/27/16: 0.63, 1.44, 0.44 11/24/16: 0.83, 2.13, 0.39 12/23/16: 0.69, 2.31, 0.30 01/21/2017: 0.77, 3.08, 0.25   24 h urine UPEP/IFE and light chain: 11/03/2014: IFE showed a monoclonal IgG heavy chain with associated lambda light chain. M protein was Undetectable    PATHOLOGY   PATHOLOGY REPORT  Bone Marrow (BM) and Peripheral Blood (PB) FINAL PATHOLOGIC DIAGNOSIS  BONE MARROW: 07/11/2015 Hypocellular marrow (20%) with no increase in plasma cells (2%). See comment. PERIPHERAL BLOOD: Mild anemia. No circulating plasma cells identified. See comment and CBC data.  BONE MARROW: 02/22/2016 Diagnosis Bone Marrow, Aspirate,Biopsy, and Clot, right iliac - NORMOCELLULAR BONE MARROW FOR AGE WITH TRILINEAGE HEMATOPOIESIS. - PLASMACYTOSIS (PLASMA CELLS 12%). - SEE COMMENT. PERIPHERAL BLOOD: - OCCASIONAL CIRCULATING PLASMA CELLS.   PROCEDURES  Bone Marrow Biopsy, 01/21/17 Bone Marrow, Aspirate, Biopsy, and Clot - NORMOCELLULAR BONE MARROW FOR AGE WITH TRILINEAGE HEMATOPOIESIS AND 2% PLASMA CELLS. PERIPHERAL BLOOD: - NORMOCYTIC-HYPOCHROMIC ANEMIA.   Colonoscopy by Dr. Ardis Hughs on 11/26/16  IMPRESSION - One 2 mm polyp in the transverse colon, removed with a cold biopsy forceps. Resected and retrieved. - Small internal and external hemorrhoids. - The examination was otherwise normal on direct and retroflexion views.   RADIOGRAPHIC STUDIES: I have personally reviewed the radiological images as listed and agreed with the findings in the report. Dg Chest 2 View  Result Date: 04/06/2017 CLINICAL DATA:  Fever, dry cough.  Medial compromised. EXAM: CHEST  2 VIEW COMPARISON:  02/16/2017. FINDINGS: Trachea is midline. Heart size stable. Right IJ Port-A-Cath tip is at the SVC RA junction. Lungs are clear. No pleural fluid. IMPRESSION: No acute findings. Electronically Signed   By: Lorin Picket M.D.   On:  04/06/2017 16:41     CXR 05/08/2016 IMPRESSION:  No acute disease.  CT Biopsy 02/22/16 IMPRESSION: 1. Technically successful CT guided right iliac bone core and aspiration biopsy.  ASSESSMENT & PLAN: 47 y.o. [place] woman, presented with anemia and leokocytosis    1.  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 Unfortunately this showed increased plasma cells 12% with additional lambda light chain staining pending. This is relapse of disease. Cytogenetics was normal  -Her M protein and lambda free light chain level has significantly increased in 02/2016, consistent with disease relapse. Her LP showed negative CSF, no evidence of CNS involvement. -I have previously spoken with Dr. Norma Fredrickson, we agreed to change her CyBorD to weekly, and add Daratumumab weekly, starting 04/18/16. Potential side effects were discussed with patient, especially cytopenia and infusion reaction, she is to take dexamethasone the day before Dara infusions.  -continue acyclovir  -Continue Zometa every 4 weeks for a total of 2 years (until 11/2017) -Due to her hospitalization for severe cytopenia and infection, Cytoxan has been held. -She restarted weekly Velcade and dexa, and on Dara every 4 weeks, tolerating well, will continue. Her dara-specific IFE was negative for M-protein on 07/07/16 at [facility], she has achieved second CR.  -Her SPEP and IFE detected a small amount M-protein again, the Dara specific immunofixation was positive, this is concerning for recurrence.  We will continue monitor her SPEP and immunofixation every months -Bone Marrow Biopsy on 01/21/2017 showed 2% of plasma cells, monoclonal, no definitive evidence of  plasma neoplasm.  It confirmed complete response. --Due to her hep B flare, her treatment has been held since 02/02/2017. Dr. Norma Fredrickson was informed. Her hepatitis B flare is likely related to daratumumab. -She was recently seen by Dr. Norma Fredrickson, she does not have a matched bone marrow, her transplant has been held temporarily.  Dr. Norma Fredrickson recommends switching her treatment to carfilzomib 56 mg/m2 on days 1 and 15 with dexamethasone 20 mg on same days and pomalidomide 2 mg on days 1-21 of 28 day cycle. She has started on 04/13/2017 -Her last labs in 2/21 showed a M spike 0.2, this is likely due to her being off medication for 2 months. Will monitor closely  -she has been tolerating treatment well, lab reviewed, slightly worsening anemia and mild thrombocytopenia, likely secondary treatment, her liver function has returned to normal now, will proceed to cycle 1 day 15 carfilzomib treatment today -F/u in 2 weeks before cycle 2   2. Transaminitis and hyperbilirubinemia, secondary to hepatitis B flare -She has developed significant transaminitis and hyperbilirubinemia since January 2019 -She was previously on hepatitis B treatment entecavir by her GI at [facility], however she was off treatment for 2-3 months in late 2018, due to her issues with refill.  She did refill and restarted at the end of December 2018, her repeated hepatitis B titer was very high in Jan 2019. -  Ultrasound of liver and Doppler of liver was negative  -She was hospitalized for this on 02/16/17, and was switched to Tenofovir by Dr. Baxter Flattery. She will continue to be closely followed by ID Dr. Baxter Flattery  -it's resolved now, her total bilirubin, liver enzymes are back to normal now   3. Type 2 diabetes, steroids induced  -She was noticed to have increased blood glucose level lately, with random blood glucose above 200. She also developed diabetic symptoms. No history of diabetes in the past. -This is likely steroids induced hyperglycemia -I  strongly recommended her to avoid any soft drink and sweats, and watch her carbohydrate intake, and exercise more  -The addition of Metformin has been discussed with her PCP.  -I previously instructed her to take metformin every day since she is now taking dexamethasone more frequently.  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 -Continue close monitoring. -We previously discussed restarting to metformin one pill a day while on steroids to see if her levels will stabilize. I previously encouraged her to take it in the morning with breakfast.  -Due to increase in steroids, Pt has been on metformin to twice daily.   -Her BG is better controlled with metformin.   4. GERD, history of GI bleeding and nausea  -Continue omeprazole daily. We previously discussed steroids can worsen her acid reflux. -She previously had an EGD at [facility] in 2017. This was benign.  -she did not do her colonoscopy because she did not have anyone to accompany her during visit. She has recently seen Dr. Ardis Hughs again, and is scheduled for colonoscopy. -Colonoscopy on 11/26/16 per Dr. Ardis Hughs notable for a polyp in the transverse colon, revealed to be tubular adenoma and negative for high grade dysplasia or malignancy.   5.  Neck pain and headaches -Started about a week ago, improved today.  Neurological symptoms -New monitoring, she takes ibuprofen as needed   Plan  -Continue with pomalyst 2 mg daily days 1-21 of 28 day cycle; cycle 1 day 15 kyprolis and dex 88m every 2 weeks  -return in 2 weeks with cycle 2 day 1 kyprolis   All questions were answered. The patient knows to call the clinic with any problems, questions or concerns.  I spent 20 minutes counseling the patient face to  face.  The total time spent in the appointment was 25 minutes and more than 50% was on counseling.   This document serves as a record of services personally performed by YTruitt Merle MD. It was created on her behalf by DTheresia Bough a trained medical scribe. The creation of this record is based on the scribe's personal observations and the provider's statements to them.   I have reviewed the above documentation for accuracy and completeness, and I agree with the above.    YTruitt Merle MD  04/27/2017

## 2017-04-27 NOTE — Patient Instructions (Signed)
[facility] Discharge Instructions for Patients Receiving Chemotherapy  Today you received the following chemotherapy agents:  Kyprolis  To help prevent nausea and vomiting after your treatment, we encourage you to take your nausea medication as directed.   If you develop nausea and vomiting that is not controlled by your nausea medication, call the clinic.   BELOW ARE SYMPTOMS THAT SHOULD BE REPORTED IMMEDIATELY:  *FEVER GREATER THAN 100.5 F  *CHILLS WITH OR WITHOUT FEVER  NAUSEA AND VOMITING THAT IS NOT CONTROLLED WITH YOUR NAUSEA MEDICATION  *UNUSUAL SHORTNESS OF BREATH  *UNUSUAL BRUISING OR BLEEDING  TENDERNESS IN MOUTH AND THROAT WITH OR WITHOUT PRESENCE OF ULCERS  *URINARY PROBLEMS  *BOWEL PROBLEMS  UNUSUAL RASH Items with * indicate a potential emergency and should be followed up as soon as possible.  Feel free to call the clinic should you have any questions or concerns. The clinic phone number is [phone].  Please show the CHEMO ALERT CARD at check-in to the Emergency Department and triage nurse.   

## 2017-04-29 ENCOUNTER — Ambulatory Visit (INDEPENDENT_AMBULATORY_CARE_PROVIDER_SITE_OTHER): Payer: Medicaid Other | Admitting: Internal Medicine

## 2017-04-29 ENCOUNTER — Encounter: Payer: Self-pay | Admitting: Internal Medicine

## 2017-04-29 ENCOUNTER — Telehealth: Payer: Self-pay | Admitting: *Deleted

## 2017-04-29 ENCOUNTER — Other Ambulatory Visit: Payer: Self-pay | Admitting: *Deleted

## 2017-04-29 VITALS — BP 111/79 | HR 86 | Temp 97.0°F | Wt 132.0 lb

## 2017-04-29 DIAGNOSIS — B181 Chronic viral hepatitis B without delta-agent: Secondary | ICD-10-CM | POA: Diagnosis present

## 2017-04-29 DIAGNOSIS — C9012 Plasma cell leukemia in relapse: Secondary | ICD-10-CM

## 2017-04-29 LAB — PROTEIN ELECTROPHORESIS, SERUM
A/G Ratio: 1.1 (ref 0.7–1.7)
ALBUMIN ELP: 3 g/dL (ref 2.9–4.4)
ALPHA-1-GLOBULIN: 0.2 g/dL (ref 0.0–0.4)
Alpha-2-Globulin: 0.7 g/dL (ref 0.4–1.0)
Beta Globulin: 0.9 g/dL (ref 0.7–1.3)
GAMMA GLOBULIN: 1 g/dL (ref 0.4–1.8)
Globulin, Total: 2.8 g/dL (ref 2.2–3.9)
M-Spike, %: 0.1 g/dL — ABNORMAL HIGH
TOTAL PROTEIN ELP: 5.8 g/dL — AB (ref 6.0–8.5)

## 2017-04-29 MED ORDER — POMALIDOMIDE 2 MG PO CAPS: 2.0000 mg | ORAL_CAPSULE | Freq: Every day | ORAL | 0 refills | Status: DC

## 2017-04-29 NOTE — Progress Notes (Signed)
RFV: follow up for chronic hepatitis B  Patient ID: Sheryl [name], female   DOB: [birth year], 47 y.o.   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 She was hospitalized in early January for acute transaminitis and Hepatitis B flare. She had been on entecavir, but recently off of it for roughly 2-3 wks due to insurance. Her hep B is Hep B E ag negative, dx in 2003, she is also known to have liver cyst of of  a 1.4 x 1.0 x 1.1 cm cyst which was noted on previous scans. AST peaked in the hospital at 3477 and has been trending downward to normal limits now- ALT 8 and AST 26 with Tbili 0.9. Coags normal.  She is feeling better than when she was hospitalized, and has also recovered from having influenza in early March. She is tolerating her chemotherapy regimen without much difficulty.   Outpatient Encounter Medications as of 04/29/2017  Medication Sig  . acyclovir (ZOVIRAX) 800 MG tablet Take 1 tablet (800 mg total) by mouth 2 (two) times daily.  . feeding supplement, ENSURE ENLIVE, (ENSURE ENLIVE) LIQD Take 237 mLs by mouth 2 (two) times daily between meals.  . metFORMIN (GLUCOPHAGE) 500 MG tablet Take 1 tablet (500 mg total) by mouth 2 (two) times daily with a meal.  . omeprazole (PRILOSEC) 20 MG capsule Take 1 capsule (20 mg total) by mouth daily. (Patient taking differently: Take 20 mg by mouth 2 (two) times daily before a meal. )  . ondansetron (ZOFRAN) 8 MG tablet Take 1 tablet (8 mg total) by mouth every 8 (eight) hours as needed for nausea or vomiting.  . polyethylene glycol (MIRALAX / GLYCOLAX) packet Take 17 g by mouth daily as needed (constipation).   . pomalidomide (POMALYST) 2 MG capsule Take 1 capsule (2 mg total) by mouth daily. Take with water on days 1-21. Repeat every 28 days.  . potassium chloride SA (K-DUR,KLOR-CON) 20 MEQ  tablet Take 1 tablet (20 mEq total) by mouth daily.  . prochlorperazine (COMPAZINE) 10 MG tablet Take 1 tablet (10 mg total) by mouth every 6 (six) hours as needed for nausea or vomiting.  . promethazine (PHENERGAN) 25 MG tablet Take 1 tablet (25 mg total) by mouth every 6 (six) hours as needed for nausea or vomiting.  Marland Kitchen tenofovir (VIREAD) 300 MG tablet Take 1 tablet (300 mg total) by mouth daily.  . ursodiol (ACTIGALL) 300 MG capsule Take 1 capsule (300 mg total) by mouth 2 (two) times daily.  Marland Kitchen dexamethasone (DECADRON) 4 MG tablet Take 5 tablets (20 mg total) by mouth every 14 (fourteen) days. (Patient not taking: Reported on 04/27/2017)   Facility-Administered Encounter Medications as of 04/29/2017  Medication  . sodium chloride 0.9 % injection 10 mL  . sodium chloride 0.9 % injection 10 mL  . sodium chloride flush (NS) 0.9 % injection 10 mL     Patient Active Problem List   Diagnosis Date Noted  . Hypokalemia 03/03/2017  . Cough   . Hyperbilirubinemia   . SIRS (systemic inflammatory response syndrome) ([place]) 02/16/2017  . Thrush 02/16/2017  . Chills with fever   . Right lower lobe pneumonia ([place]) 02/09/2017  . Transaminitis 02/09/2017  . HCAP (healthcare-associated pneumonia) 05/11/2016  . Influenza B 05/11/2016  . Vomiting 05/11/2016  . Leukopenia 05/11/2016  . 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  Hypersensitivity reaction 04/18/2016  . Hemorrhoid 12/03/2015  . Headache 12/03/2015  . Dehydration 11/14/2015  . Fever 11/14/2015  . 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 Port catheter in place 05/30/2015  . Acute pyelonephritis   . Immunosuppressed status ([place])   . UTI (urinary tract infection) 05/21/2015  . Sepsis ([place]) 05/20/2015  . Hepatitis 10/24/2014  . 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[place]) 10/24/2014  . GIB (gastrointestinal bleeding) 10/07/2014  . Healthcare-associated pneumonia 10/07/2014  . Constipation 09/17/2014  . Leukocytosis 09/14/2014  . Nausea &  vomiting 09/14/2014  . Abdominal pain 09/14/2014  . Abnormal LFTs 09/14/2014  . Bilateral leg numbness 09/14/2014  . Normocytic anemia 09/14/2014  . GERD (gastroesophageal reflux disease)   . Chronic hepatitis B without delta agent without hepatic coma (HCC)   . Gastroesophageal reflux disease without esophagitis      Health Maintenance Due  Topic Date Due  . URINE MICROALBUMIN  06/28/1978  . TETANUS/TDAP  06/28/1987  . PAP SMEAR  06/27/1989     Review of Systems  Constitutional: Negative for fever, chills, diaphoresis, activity change, appetite change, fatigue and unexpected weight change.  HENT: Negative for congestion, sore throat, rhinorrhea, sneezing, trouble swallowing and sinus pressure.  Eyes: Negative for photophobia and visual disturbance.  Respiratory: Negative for cough, chest tightness, shortness of breath, wheezing and stridor.  Cardiovascular: Negative for chest pain, palpitations and leg swelling.  Gastrointestinal: Negative for nausea, vomiting, abdominal pain, diarrhea, constipation, blood in stool, abdominal distention and anal bleeding.  Genitourinary: Negative for dysuria, hematuria, flank pain and difficulty urinating.  Musculoskeletal: Negative for myalgias, back pain, joint swelling, arthralgias and gait problem.  Skin: Negative for color change, pallor, rash and wound.  Neurological: Negative for dizziness, tremors, weakness and light-headedness.  Hematological: Negative for adenopathy. Does not bruise/bleed easily.  Psychiatric/Behavioral: Negative for behavioral problems, confusion, sleep disturbance, dysphoric mood, decreased concentration and agitation.    Physical Exam   BP 111/79   Pulse 86   Temp (!) 97 F (36.1 C) (Oral)   Wt 132 lb (59.9 kg)   BMI 23.38 kg/m   Physical Exam  Constitutional:  oriented to person, place, and time. appears well-developed and well-nourished. No distress.  HENT: [place]/AT, PERRLA, no scleral icterus Mouth/Throat:  Oropharynx is clear and moist. No oropharyngeal exudate.  Cardiovascular: Normal rate, regular rhythm and normal heart sounds. Exam reveals no gallop and no friction rub.  No murmur heard.  Pulmonary/Chest: Effort normal and breath sounds normal. No respiratory distress.  has no wheezes.  Neck = supple, no nuchal rigidity Abdominal: Soft. Bowel sounds are normal.  exhibits no distension. There is no tenderness.  Lymphadenopathy: no cervical adenopathy. No axillary adenopathy Neurological: alert and oriented to person, place, and time.  Skin: Skin is warm and dry. No rash noted. No erythema.  Psychiatric: a normal mood and affect.  behavior is normal.    CBC Lab Results  Component Value Date   WBC 5.4 04/27/2017   RBC 2.95 (L) 04/27/2017   HGB 9.2 (L) 04/27/2017   HCT 28.6 (L) 04/27/2017   PLT 127 (L) 04/27/2017   MCV 96.9 04/27/2017   MCH 31.2 04/27/2017   MCHC 32.2 04/27/2017   RDW 15.6 (H) 04/27/2017   LYMPHSABS 1.5 04/27/2017   MONOABS 0.7 04/27/2017   EOSABS 0.1 04/27/2017    BMET Lab Results  Component Value Date   NA 140 04/27/2017   K 3.8 04/27/2017   CL 109 04/27/2017   CO2 25 04/27/2017   GLUCOSE 128 04/27/2017  BUN 9 04/27/2017   CREATININE 0.73 04/27/2017   CALCIUM 8.8 04/27/2017   GFRNONAA >60 04/27/2017   GFRAA >60 04/27/2017      Assessment and Plan  Chronic hepatitis B = continue on tenofovir 300mg  daily. Will reach out to dr Burr Medico to check quantitative viral load at her next blood draw. For now, plan to continue taking meds daily. At next visit, will see if TAF is covered by [facility] for chronic hep b treatment  Transaminitis/hyperbilirubinemia = now resolved, likely due to flare resolved.

## 2017-04-29 NOTE — Telephone Encounter (Signed)
Spoke with daughter [name], and instructed her to call [facility] and help pt take survey for Pomalyst refill.  Goi voiced understanding. [facility]    Phone      [phone].

## 2017-05-05 LAB — IFE, DARA-SPECIFIC, SERUM
IGA: 130 mg/dL (ref 87–352)
IGG (IMMUNOGLOBIN G), SERUM: 1067 mg/dL (ref 700–1600)
IGM (IMMUNOGLOBULIN M), SRM: 73 mg/dL (ref 26–217)

## 2017-05-11 ENCOUNTER — Inpatient Hospital Stay: Payer: Medicaid Other

## 2017-05-11 ENCOUNTER — Inpatient Hospital Stay (HOSPITAL_BASED_OUTPATIENT_CLINIC_OR_DEPARTMENT_OTHER): Payer: Medicaid Other | Admitting: Nurse Practitioner

## 2017-05-11 ENCOUNTER — Inpatient Hospital Stay: Payer: Medicaid Other | Attending: Hematology

## 2017-05-11 VITALS — BP 111/62 | HR 88 | Temp 98.1°F | Resp 18 | Ht 63.0 in | Wt 131.4 lb

## 2017-05-11 DIAGNOSIS — E099 Drug or chemical induced diabetes mellitus without complications: Secondary | ICD-10-CM

## 2017-05-11 DIAGNOSIS — Z5112 Encounter for antineoplastic immunotherapy: Secondary | ICD-10-CM | POA: Insufficient documentation

## 2017-05-11 DIAGNOSIS — M542 Cervicalgia: Secondary | ICD-10-CM | POA: Diagnosis not present

## 2017-05-11 DIAGNOSIS — C9010 Plasma cell leukemia not having achieved remission: Secondary | ICD-10-CM

## 2017-05-11 DIAGNOSIS — D649 Anemia, unspecified: Secondary | ICD-10-CM

## 2017-05-11 DIAGNOSIS — K5900 Constipation, unspecified: Secondary | ICD-10-CM | POA: Diagnosis not present

## 2017-05-11 DIAGNOSIS — Z95828 Presence of other vascular implants and grafts: Secondary | ICD-10-CM

## 2017-05-11 DIAGNOSIS — C9012 Plasma cell leukemia in relapse: Secondary | ICD-10-CM | POA: Diagnosis present

## 2017-05-11 DIAGNOSIS — R630 Anorexia: Secondary | ICD-10-CM

## 2017-05-11 DIAGNOSIS — H538 Other visual disturbances: Secondary | ICD-10-CM

## 2017-05-11 DIAGNOSIS — R51 Headache: Secondary | ICD-10-CM

## 2017-05-11 DIAGNOSIS — K219 Gastro-esophageal reflux disease without esophagitis: Secondary | ICD-10-CM | POA: Diagnosis not present

## 2017-05-11 DIAGNOSIS — R740 Nonspecific elevation of levels of transaminase and lactic acid dehydrogenase [LDH]: Secondary | ICD-10-CM | POA: Diagnosis not present

## 2017-05-11 DIAGNOSIS — R110 Nausea: Secondary | ICD-10-CM

## 2017-05-11 LAB — COMPREHENSIVE METABOLIC PANEL
ALBUMIN: 2.9 g/dL — AB (ref 3.5–5.0)
ALT: 8 U/L (ref 0–55)
AST: 25 U/L (ref 5–34)
Alkaline Phosphatase: 102 U/L (ref 40–150)
Anion gap: 6 (ref 3–11)
BILIRUBIN TOTAL: 0.5 mg/dL (ref 0.2–1.2)
BUN: 12 mg/dL (ref 7–26)
CO2: 23 mmol/L (ref 22–29)
Calcium: 9 mg/dL (ref 8.4–10.4)
Chloride: 111 mmol/L — ABNORMAL HIGH (ref 98–109)
Creatinine, Ser: 0.7 mg/dL (ref 0.60–1.10)
Glucose, Bld: 109 mg/dL (ref 70–140)
POTASSIUM: 3.9 mmol/L (ref 3.5–5.1)
Sodium: 140 mmol/L (ref 136–145)
TOTAL PROTEIN: 6.7 g/dL (ref 6.4–8.3)

## 2017-05-11 LAB — CBC WITH DIFFERENTIAL/PLATELET
BASOS ABS: 0 10*3/uL (ref 0.0–0.1)
Basophils Relative: 1 %
Eosinophils Absolute: 0.1 10*3/uL (ref 0.0–0.5)
Eosinophils Relative: 2 %
HEMATOCRIT: 30.1 % — AB (ref 34.8–46.6)
Hemoglobin: 9.5 g/dL — ABNORMAL LOW (ref 11.6–15.9)
LYMPHS PCT: 36 %
Lymphs Abs: 1.4 10*3/uL (ref 0.9–3.3)
MCH: 30.5 pg (ref 25.1–34.0)
MCHC: 31.6 g/dL (ref 31.5–36.0)
MCV: 96.8 fL (ref 79.5–101.0)
Monocytes Absolute: 0.6 10*3/uL (ref 0.1–0.9)
Monocytes Relative: 15 %
NEUTROS ABS: 1.8 10*3/uL (ref 1.5–6.5)
NEUTROS PCT: 46 %
Platelets: 262 10*3/uL (ref 145–400)
RBC: 3.11 MIL/uL — AB (ref 3.70–5.45)
RDW: 14.7 % — ABNORMAL HIGH (ref 11.2–14.5)
WBC: 3.9 10*3/uL (ref 3.9–10.3)

## 2017-05-11 MED ORDER — DEXTROSE 5 % IV SOLN
56.0000 mg/m2 | Freq: Once | INTRAVENOUS | Status: AC
  Administered 2017-05-11: 90 mg via INTRAVENOUS
  Filled 2017-05-11: qty 30

## 2017-05-11 MED ORDER — SODIUM CHLORIDE 0.9 % IV SOLN
Freq: Once | INTRAVENOUS | Status: AC
  Administered 2017-05-11: 13:00:00 via INTRAVENOUS

## 2017-05-11 MED ORDER — SODIUM CHLORIDE 0.9% FLUSH
10.0000 mL | INTRAVENOUS | Status: DC | PRN
  Administered 2017-05-11: 10 mL
  Filled 2017-05-11: qty 10

## 2017-05-11 MED ORDER — DEXAMETHASONE 4 MG PO TABS: 20.0000 mg | ORAL_TABLET | ORAL | 2 refills | Status: DC

## 2017-05-11 MED ORDER — PROCHLORPERAZINE MALEATE 10 MG PO TABS: 10.0000 mg | ORAL_TABLET | Freq: Once | ORAL | Status: DC

## 2017-05-11 MED ORDER — HEPARIN SOD (PORK) LOCK FLUSH 100 UNIT/ML IV SOLN
500.0000 [IU] | Freq: Once | INTRAVENOUS | Status: AC | PRN
  Administered 2017-05-11: 500 [IU]
  Filled 2017-05-11: qty 5

## 2017-05-11 MED ORDER — ZOLEDRONIC ACID 4 MG/100ML IV SOLN
4.0000 mg | Freq: Once | INTRAVENOUS | Status: DC
  Filled 2017-05-11: qty 100

## 2017-05-11 MED ORDER — SODIUM CHLORIDE 0.9% FLUSH
10.0000 mL | INTRAVENOUS | Status: DC | PRN
  Filled 2017-05-11: qty 10

## 2017-05-11 MED ORDER — OMEPRAZOLE 20 MG PO CPDR: 20.0000 mg | DELAYED_RELEASE_CAPSULE | Freq: Every day | ORAL | 5 refills | Status: DC

## 2017-05-11 MED ORDER — SODIUM CHLORIDE 0.9 % IJ SOLN
10.0000 mL | INTRAMUSCULAR | Status: DC | PRN
  Administered 2017-05-11: 10 mL via INTRAVENOUS
  Filled 2017-05-11: qty 10

## 2017-05-11 NOTE — Progress Notes (Signed)
Per Lacie-NP and Theadora Rama P-RPh, OK to resume Zometa today. Orders released. Will continue to monitor

## 2017-05-11 NOTE — Patient Instructions (Addendum)
[facility] Discharge Instructions for Patients Receiving Chemotherapy  Today you received the following chemotherapy agents: Carfilzomib (Kyprolis).  To help prevent nausea and vomiting after your treatment, we encourage you to take your nausea medication as prescribed.  If you develop nausea and vomiting that is not controlled by your nausea medication, call the clinic.   BELOW ARE SYMPTOMS THAT SHOULD BE REPORTED IMMEDIATELY:  *FEVER GREATER THAN 100.5 F  *CHILLS WITH OR WITHOUT FEVER  NAUSEA AND VOMITING THAT IS NOT CONTROLLED WITH YOUR NAUSEA MEDICATION  *UNUSUAL SHORTNESS OF BREATH  *UNUSUAL BRUISING OR BLEEDING  TENDERNESS IN MOUTH AND THROAT WITH OR WITHOUT PRESENCE OF ULCERS  *URINARY PROBLEMS  *BOWEL PROBLEMS  UNUSUAL RASH Items with * indicate a potential emergency and should be followed up as soon as possible.  Feel free to call the clinic should you have any questions or concerns. The clinic phone number is [phone].  Please show the [place] at check-in to the Emergency Department and triage nurse.  Zoledronic Acid injection (Hypercalcemia, Oncology) What is this medicine? ZOLEDRONIC ACID (ZOE le dron ik AS id) lowers the amount of calcium loss from bone. It is used to treat too much calcium in your blood from cancer. It is also used to prevent complications of cancer that has spread to the bone. This medicine may be used for other purposes; ask your health care provider or pharmacist if you have questions. COMMON BRAND NAME(S): Zometa What should I tell my health care provider before I take this medicine? They need to know if you have any of these conditions: -aspirin-sensitive asthma -cancer, especially if you are receiving medicines used to treat cancer -dental disease or wear dentures -infection -kidney disease -receiving corticosteroids like dexamethasone or prednisone -an unusual or allergic reaction to zoledronic acid,  other medicines, foods, dyes, or preservatives -pregnant or trying to get pregnant -breast-feeding How should I use this medicine? This medicine is for infusion into a vein. It is given by a health care professional in a hospital or clinic setting. Talk to your pediatrician regarding the use of this medicine in children. Special care may be needed. Overdosage: If you think you have taken too much of this medicine contact a poison control center or emergency room at once. NOTE: This medicine is only for you. Do not share this medicine with others. What if I miss a dose? It is important not to miss your dose. Call your doctor or health care professional if you are unable to keep an appointment. What may interact with this medicine? -certain antibiotics given by injection -NSAIDs, medicines for pain and inflammation, like ibuprofen or naproxen -some diuretics like bumetanide, furosemide -teriparatide -thalidomide This list may not describe all possible interactions. Give your health care provider a list of all the medicines, herbs, non-prescription drugs, or dietary supplements you use. Also tell them if you smoke, drink alcohol, or use illegal drugs. Some items may interact with your medicine. What should I watch for while using this medicine? Visit your doctor or health care professional for regular checkups. It may be some time before you see the benefit from this medicine. Do not stop taking your medicine unless your doctor tells you to. Your doctor may order blood tests or other tests to see how you are doing. Women should inform their doctor if they wish to become pregnant or think they might be pregnant. There is a potential for serious side effects to an unborn child. Talk to  Talk to your health care professional or pharmacist for more information. You should make sure that you get enough calcium and vitamin D while you are taking this medicine. Discuss the foods you eat and the vitamins you take  with your health care professional. Some people who take this medicine have severe bone, joint, and/or muscle pain. This medicine may also increase your risk for jaw problems or a broken thigh bone. Tell your doctor right away if you have severe pain in your jaw, bones, joints, or muscles. Tell your doctor if you have any pain that does not go away or that gets worse. Tell your dentist and dental surgeon that you are taking this medicine. You should not have major dental surgery while on this medicine. See your dentist to have a dental exam and fix any dental problems before starting this medicine. Take good care of your teeth while on this medicine. Make sure you see your dentist for regular follow-up appointments. What side effects may I notice from receiving this medicine? Side effects that you should report to your doctor or health care professional as soon as possible: -allergic reactions like skin rash, itching or hives, swelling of the face, lips, or tongue -anxiety, confusion, or depression -breathing problems -changes in vision -eye pain -feeling faint or lightheaded, falls -jaw pain, especially after dental work -mouth sores -muscle cramps, stiffness, or weakness -redness, blistering, peeling or loosening of the skin, including inside the mouth -trouble passing urine or change in the amount of urine Side effects that usually do not require medical attention (report to your doctor or health care professional if they continue or are bothersome): -bone, joint, or muscle pain -constipation -diarrhea -fever -hair loss -irritation at site where injected -loss of appetite -nausea, vomiting -stomach upset -trouble sleeping -trouble swallowing -weak or tired This list may not describe all possible side effects. Call your doctor for medical advice about side effects. You may report side effects to FDA at 1-800-FDA-1088. Where should I keep my medicine? This drug is given in a hospital  or clinic and will not be stored at home. NOTE: This sheet is a summary. It may not cover all possible information. If you have questions about this medicine, talk to your doctor, pharmacist, or health care provider.  2018 Elsevier/Gold Standard (2013-06-18 14:19:39)  

## 2017-05-11 NOTE — Progress Notes (Signed)
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)   10/07/2014 Imaging    Abdominal ultrasound showed mild splenomegaly, stable perisplenic complex fluid collection unchanged since 08/27/2010.      10/10/2014 Miscellaneous    Peripheral blood chemistry and leukocytosis with total white count 78K, comprised of large plasma cells and his normocytic anemia. There is a myeloid left shift with previous surgical radium blasts. Flow cytometry showed 64% plasma cells      10/10/2014 Bone Marrow Biopsy    Markedly hypercellular marrow (95%), Atypical plasma cells comprise 57% of the cellularity. There was diminished multilineage in hematopoiesis with adequate maturation. Breasts less than 1%), no overt dysplasia of the myeloid or erythroid lineages.       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      05/07/2015 - 05/12/2015 Hospital Admission    [facility], [facility] ID workup was negative, EGD showed evidence of gastritis and duodenitis, no H. pylori or CMV.      05/20/2015  - 05/24/2015 Hospital Admission    [facility]      07/11/2015 Bone Marrow Biopsy    Post transplant 100 a bone marrow biopsy showed hypocellular marrow, 20%, no increase in plasma cells (2%) or other abnormalities.      08/22/2015 - 01/02/2016 Chemotherapy    MaintenanceCyborD (cytoxan 3012mm2 iv, bortezomib 1.5 mg/m, dexamethasone 40 mg, every 2 weeks, changed to Velcade maintenance after 4 months treatment      01/16/2016 - 04/19/2016 Chemotherapy    Maintenance Velcade 1.3 mg/m every 2 weeks      02/22/2016 Pathology Results    BONE MARROW: Diagnosis Bone Marrow, Aspirate,Biopsy, and Clot, right iliac - NORMOCELLULAR BONE MARROW FOR AGE WITH TRILINEAGE HEMATOPOIESIS. - PLASMACYTOSIS (PLASMA CELLS 12%). - SEE COMMENT. PERIPHERAL BLOOD: - OCCASIONAL CIRCULATING PLASMA CELLS.      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       04/18/2016 - 03/26/2017 Chemotherapy    Daratumumab per protocol  CyBorD every week, cytoxan was held after 04/24/2016 dye to cytopenia and infection  -discontinued due to Hep B flare      05/11/2016 - 05/16/2016 Hospital Admission    Healthcare-associated pneumonia       Hospital Admission    Admit date: 02/16/17 - 02/21/17  Admission diagnosis: Abnormal LFT's Additional comments: Assoc diagnoses: Hep B flare, transminitis, dehydration, fever, SIRS      04/06/2017 -  Chemotherapy    PENDING carfilzomib 56 mg/m2 on days 1 and 15 (except 2045m2 on C1D1  and C1D2) with dexamethasone 20 mg on same days and pomalidomide 2 mg on days 1-21 of 28 day cycle         CURRENT THERAPY:  carfilzomib 56 mg/m2 on days 1 and 15(except 60m/m2 on C1D1 and C1D2)with dexamethasone 20 mg on same days and pomalidomide 2 mg on days 1-21 of 28 day cycleon 04/13/17  INTERVAL HISTORY: Ms. HLuciareturns for follow up as scheduled prior to cycle 2 kyprolis,  dexamethasone, and pomalyst. She feels well. Has mildly decreased appetite and intermittent nausea while on treatment, improves while off therapy and controlled with current anti-emetics. She ran out of prilosec, GERD has been more noticeable lately. Takes miralax PRN for constipation, last BM today. Has intermittent mild headaches, controlled with ibuprofen. She began noticing eye "heaviness" this month with intermittent slight blurry vision. No double vision or vision loss. Has occasional dry cough, no fever, chills, chest pain, or dyspnea.   REVIEW OF SYSTEMS:   Constitutional: Denies fevers, chills or abnormal weight loss (+) decreased appetite on treatment Eyes: Denies double vision, vision loss, redness, or eye pain (+) intermittent slight blurriness of vision (+) eye "heaviness" Ears, nose, mouth, throat, and face: Denies mucositis or sore throat Respiratory: Denies dyspnea or wheezes (+) occasional dry cough  Cardiovascular: Denies palpitation, chest discomfort or lower extremity swelling Gastrointestinal:  Denies vomiting, diarrhea, or change in bowel habits (+) intermittent nausea on treatment  Skin: Denies abnormal skin rashes (+) GERD, managed with prilosec Lymphatics: Denies new lymphadenopathy or easy bruising Neurological:Denies numbness, tingling or new weaknesses (+) intermittent mild headaches, controlled with ibuprofen Behavioral/Psych: Mood is stable, no new changes  All other systems were reviewed with the patient and are negative.  MEDICAL HISTORY:  Past Medical History:  Diagnosis Date  . Chills with fever    intermittently since d/c from hospital  . Dysuria-frequency syndrome    w/ pink urine  . GERD (gastroesophageal reflux disease)   . Hepatitis   . History of positive PPD    DX 2011--  CXR DONE NO EVIDENCE  . History of ureter stent   . Hydronephrosis, right   . Neuromuscular disorder ([facility])    legs numb intermittently  . 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   .  Pneumonia   . Right ureteral stone   . Urosepsis 8/14   ad[facility]    SURGICAL HISTORY: Past Surgical History:  Procedure Laterality Date  . CYSTOSCOPY W/ URETERAL STENT PLACEMENT Right 09/25/2012   Procedure: CYSTOSCOPY WITH RETROGRADE PYELOGRAM/URETERAL STENT PLACEMENT;  Surgeon: TAlexis Frock MD;  Location: [facility];  Service: Urology;  Laterality: Right;  . CYSTOSCOPY WITH RETROGRADE PYELOGRAM, URETEROSCOPY AND STENT PLACEMENT Right 10/15/2012   Procedure: CYSTOSCOPY WITH RETROGRADE PYELOGRAM, URETEROSCOPY AND REMOVAL STENT WITH  STENT PLACEMENT;  Surgeon: TAlexis Frock MD;  [facility]  Service: Urology;  Laterality: Right;  . ESOPHAGOGASTRODUODENOSCOPY (EGD) WITH PROPOFOL N/A 11/16/2014   Procedure: ESOPHAGOGASTRODUODENOSCOPY (EGD) WITH PROPOFOL;  Surgeon: DMilus Banister MD;  Location: [facility];  Service: Endoscopy;  Laterality: N/A;  . HOLMIUM LASER APPLICATION Right [id number]/29/9242  Procedure: HOLMIUM LASER APPLICATION;  Surgeon: TAlexis Frock MD;  [facility]  Service: Urology;  Laterality: Right;  . LIVER BIOPSY    . OTHER SURGICAL HISTORY Right    removal of ovarian cyst  . removal of uterine cyst     years ago  . RIGHT VATS W/ DRAINAGE PEURAL EFFUSION AND BX'S  10-30-2008    I  have reviewed the social history and family history with the patient and they are unchanged from previous note.  ALLERGIES:  has No Known Allergies.  MEDICATIONS:  Current Outpatient Medications  Medication Sig Dispense Refill  . acyclovir (ZOVIRAX) 800 MG tablet Take 1 tablet (800 mg total) by mouth 2 (two) times daily. 60 tablet 2  . dexamethasone (DECADRON) 4 MG tablet Take 5 tablets (20 mg total) by mouth every 14 (fourteen) days. 20 tablet 2  . ondansetron (ZOFRAN) 8 MG tablet Take 1 tablet (8 mg total) by mouth every 8 (eight) hours as needed for nausea or vomiting. 30 tablet 1  . polyethylene glycol (MIRALAX / GLYCOLAX) packet  Take 17 g by mouth daily as needed (constipation).     . pomalidomide (POMALYST) 2 MG capsule Take 1 capsule (2 mg total) by mouth daily. Take with water on days 1-21. Repeat every 28 days. 21 capsule 0  . potassium chloride SA (K-DUR,KLOR-CON) 20 MEQ tablet Take 1 tablet (20 mEq total) by mouth daily. 30 tablet 0  . prochlorperazine (COMPAZINE) 10 MG tablet Take 1 tablet (10 mg total) by mouth every 6 (six) hours as needed for nausea or vomiting. 30 tablet 0  . promethazine (PHENERGAN) 25 MG tablet Take 1 tablet (25 mg total) by mouth every 6 (six) hours as needed for nausea or vomiting. 30 tablet 0  . tenofovir (VIREAD) 300 MG tablet Take 1 tablet (300 mg total) by mouth daily. 30 tablet 6  . ursodiol (ACTIGALL) 300 MG capsule Take 1 capsule (300 mg total) by mouth 2 (two) times daily. 60 capsule 0  . feeding supplement, ENSURE ENLIVE, (ENSURE ENLIVE) LIQD Take 237 mLs by mouth 2 (two) times daily between meals. 237 mL 12  . metFORMIN (GLUCOPHAGE) 500 MG tablet Take 1 tablet (500 mg total) by mouth 2 (two) times daily with a meal. 60 tablet 2  . omeprazole (PRILOSEC) 20 MG capsule Take 1 capsule (20 mg total) by mouth daily. 30 capsule 5   No current facility-administered medications for this visit.    Facility-Administered Medications Ordered in Other Visits  Medication Dose Route Frequency Provider Last Rate Last Dose  . sodium chloride 0.9 % injection 10 mL  10 mL Intravenous PRN Truitt Merle, MD   10 mL at 12/11/15 1532  . sodium chloride 0.9 % injection 10 mL  10 mL Intravenous PRN Truitt Merle, MD   10 mL at 06/11/16 1505  . sodium chloride flush (NS) 0.9 % injection 10 mL  10 mL Intravenous PRN Truitt Merle, MD   10 mL at 10/09/15 1717    PHYSICAL EXAMINATION: ECOG PERFORMANCE STATUS: 1 - Symptomatic but completely ambulatory  Vitals:   05/11/17 1151  BP: 111/62  Pulse: 88  Resp: 18  Temp: 98.1 F (36.7 C)  SpO2: 100%   Filed Weights   05/11/17 1151  Weight: 131 lb 6.4 oz (59.6 kg)      GENERAL:alert, no distress and comfortable SKIN: skin color, texture, turgor are normal, no rashes or significant lesions EYES: normal, Conjunctiva are pink and non-injected, sclera clear OROPHARYNX:no exudate, no erythema and lips, buccal mucosa, and tongue normal  LYMPH:  no palpable cervical, supraclavicular, or axillary lymphadenopathy LUNGS: clear to auscultation with normal breathing effort HEART: regular rate & rhythm and no murmurs and no lower extremity edema ABDOMEN:abdomen soft, non-tender and normal bowel sounds. No hepatomegaly  Musculoskeletal:no cyanosis of digits and no clubbing  NEURO: alert & oriented x 3 with fluent speech, no  focal motor/sensory deficits PAC without erythema   LABORATORY DATA:  I have reviewed the data as listed CBC Latest Ref Rng & Units 05/11/2017 04/27/2017 04/13/2017  WBC 3.9 - 10.3 K/uL 3.9 5.4 3.4(L)  Hemoglobin 11.6 - 15.9 g/dL 9.5(L) 9.2(L) 11.2(L)  Hematocrit 34.8 - 46.6 % 30.1(L) 28.6(L) 32.7(L)  Platelets 145 - 400 K/uL 262 127(L) 180     CMP Latest Ref Rng & Units 05/11/2017 04/27/2017 04/13/2017  Glucose 70 - 140 mg/dL 109 128 104  BUN 7 - 26 mg/dL 12 9 6(L)  Creatinine 0.60 - 1.10 mg/dL 0.70 0.73 0.70  Sodium 136 - 145 mmol/L 140 140 141  Potassium 3.5 - 5.1 mmol/L 3.9 3.8 3.4(L)  Chloride 98 - 109 mmol/L 111(H) 109 108  CO2 22 - 29 mmol/L _0 Calcium 8.4 - 10.4 mg/dL 9.0 8.8 9.2  Total Protein 6.4 - 8.3 g/dL 6.7 6.1(L) 6.7  Total Bilirubin 0.2 - 1.2 mg/dL 0.5 0.9 1.9(H)  Alkaline Phos 40 - 150 U/L 102 136 93  AST 5 - 34 U/L 25 26 39(H)  ALT 0 - 55 U/L _1 SPEP M-protein  09/15/2014: 4.2 10/08/14: 4.6 12/08/2014: 0.2 02/15/2015: not sufficient sample for test   09/11/2015: not observed 10/09/2015: not det   11/06/2015: not det  12/25/2015: not det  02/13/2016: 0.3 03/12/2016: 0.8 04/09/2016: 0.6 05/08/16: 0.1 06/04/16: 0.1 07/02/2016: 0.2 07/09/2016: 0.2 (dara specific IFE negative) 08/05/16: Not observed  09/01/16: Not  observed 09/29/16: 0.1(dara specific IFE negative) 10/27/16: Not observed 11/24/16 : Not observed 12/23/16: 0.1 (Dara specific IFE showed IgG monoclonal protein with lambda light chain) 01/21/2017: 0.1 (Dara specific IFE showed IgG monoclonal protein with lambda light chain) 03/26/17: 0.2 (Dara specific IFE shows IgG monoclonal protein with lambda light chain) 04/27/17: 0.1 (Dara specific IFE shows IgG monoclonal protein with lambda light chain)   IgG mg/dl 11/03/2014: 3150  12/08/2014:  759 02/14/2014: 860 09/11/2015: 1347 10/09/2015: 1457 11/06/2015: 1504 12/25/2015: 1400 02/13/2016: 1543 03/12/2016: 1860 04/09/2016: 1620 05/08/16: 749 06/04/16: 751 5/30/20187: 796 07/09/2016: 701 08/05/16: 678 09/01/16: 650 09/29/16: 727 10/27/16: 634 11/24/16: 685  12/23/16: 714 01/21/2017: 709 03/26/17: 874 04/27/17: 1067  Kappa/lambda light chains levels and ration  11/03/14: 0.75, 152, 0.00 12/22/2014: 1.10, 2.60, 0.42 02/15/2015: 9.59,14.35, 0.67 09/11/2015: 2.44, 2.72, 0.90 10/09/2015: 3.09, 3.49, 0.89 11/06/2015: 2.30, 2.62, 0.88 12/25/2015: 2.51, 3.0, 0.84 02/13/2016: 2.64, 15.3, 0.17 03/12/2016: 2.27, 45.5, 0.05 04/09/2016: 3.33, 45.5, 0.07 05/08/2016: 0.49, 1.21, 0.40 06/04/2016: 0.83, 1.11, 0.75 07/02/16: 0.51, 1.43, 0.36 07/23/16: 0.68, 1.02, 0.67 08/05/16: 0.62, 1.09, 0.57 09/01/16: 0.80, 1.21, 0.66 09/29/16: 0.44, 1.07, 0.41 10/27/16: 0.63, 1.44, 0.44 11/24/16: 0.83, 2.13, 0.39 12/23/16: 0.69, 2.31, 0.30 01/21/2017: 0.77, 3.08, 0.25   24 h urine UPEP/IFE and light chain: 11/03/2014: IFE showed a monoclonal IgG heavy chain with associated lambda light chain. M protein was Undetectable    PATHOLOGY   PATHOLOGY REPORT  Bone Marrow (BM) and Peripheral Blood (PB) FINAL PATHOLOGIC DIAGNOSIS  BONE MARROW: 07/11/2015 Hypocellular marrow (20%) with no increase in plasma cells (2%). See comment. PERIPHERAL BLOOD: Mild anemia. No circulating plasma cells identified. See comment and CBC  data.  BONE MARROW: 02/22/2016 Diagnosis Bone Marrow, Aspirate,Biopsy, and Clot, right iliac - NORMOCELLULAR BONE MARROW FOR AGE WITH TRILINEAGE HEMATOPOIESIS. - PLASMACYTOSIS (PLASMA CELLS 12%). - SEE COMMENT. PERIPHERAL BLOOD: - OCCASIONAL CIRCULATING PLASMA CELLS.   PROCEDURES  Bone Marrow Biopsy, 01/21/17 Bone Marrow, Aspirate, Biopsy, and Clot - NORMOCELLULAR BONE MARROW FOR AGE WITH TRILINEAGE HEMATOPOIESIS  AND 2% PLASMA CELLS. PERIPHERAL BLOOD: - NORMOCYTIC-HYPOCHROMIC ANEMIA.      RADIOGRAPHIC STUDIES: I have personally reviewed the radiological images as listed and agreed with the findings in the report. No results found.   ASSESSMENT & PLAN: 47 y.o. [place] woman, presented with anemia and leokocytosis    1.  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 Transaminitis and hyperbilirubinemia, secondary to hepatitis B flare 3. Type 2 DM, steroid-induced; previously on metformin but has been on hold lately due to less frequent steroid dosing 4. GERD, h/o GI bleeding and nausea 5. Neck pain and headaches 6. Blurriness of vision, eye "heaviness"  Ms. Delafuente appears stable. She completed cycle 1 kyprolis days 1 and 15, pomalyst days 1-21, and dex 20 mg q2 weeks with chemo. She tolerated well overall with mild constipation, nausea, and decreased appetite that appears well controlled and improved on her week off. She developed mild intermittent blurry vision with eye heaviness, possibly related to chemotherapy. Not impairing function or vision. Will monitor. VS, weight stable. Labs reviewed; no significant cytopenias, continue regimen at current dose. She has not been taking metformin lately; BG in normal range given less frequent steroid dosing, OK to remain off metformin for now. Return in 2 weeks for f/u with Dr. Burr Medico and day 15 kyprolis.   PLAN -Labs reviewed, proceed with cycle 2 kyrolis (days 1 and 15), dexamethasone 20 mg q2 weeks with chemo,  and 2 mg pomalyst days 1-21 every 28 days -OK to restart zometa per Dr. Burr Medico -Refilled dex, prilosec -Return for f/u in 2 weeks for cycle 2 day 15 kyprolis   All questions were answered. The patient knows to call the clinic with any problems, questions or concerns. No barriers to learning was detected.     Alla Feeling, NP 05/12/17

## 2017-05-11 NOTE — Progress Notes (Signed)
Ok to resume Zometa today, v.o Tobey Bride, PharmD, BCPS, BCOP

## 2017-05-12 ENCOUNTER — Encounter: Payer: Self-pay | Admitting: Nurse Practitioner

## 2017-05-12 ENCOUNTER — Other Ambulatory Visit: Payer: Self-pay | Admitting: Nurse Practitioner

## 2017-05-12 DIAGNOSIS — E099 Drug or chemical induced diabetes mellitus without complications: Secondary | ICD-10-CM

## 2017-05-12 DIAGNOSIS — T380X5A Adverse effect of glucocorticoids and synthetic analogues, initial encounter: Principal | ICD-10-CM

## 2017-05-12 MED ORDER — OMEPRAZOLE 20 MG PO CPDR: 20.0000 mg | DELAYED_RELEASE_CAPSULE | Freq: Every day | ORAL | 5 refills | Status: DC

## 2017-05-12 MED ORDER — DEXAMETHASONE 4 MG PO TABS: 20.0000 mg | ORAL_TABLET | ORAL | 2 refills | Status: DC

## 2017-05-25 ENCOUNTER — Inpatient Hospital Stay: Payer: Medicaid Other

## 2017-05-25 VITALS — BP 112/61 | HR 85 | Temp 98.6°F | Resp 20 | Wt 135.0 lb

## 2017-05-25 DIAGNOSIS — C9010 Plasma cell leukemia not having achieved remission: Secondary | ICD-10-CM

## 2017-05-25 DIAGNOSIS — Z5112 Encounter for antineoplastic immunotherapy: Secondary | ICD-10-CM | POA: Diagnosis not present

## 2017-05-25 DIAGNOSIS — E099 Drug or chemical induced diabetes mellitus without complications: Secondary | ICD-10-CM

## 2017-05-25 DIAGNOSIS — C9012 Plasma cell leukemia in relapse: Secondary | ICD-10-CM

## 2017-05-25 DIAGNOSIS — Z95828 Presence of other vascular implants and grafts: Secondary | ICD-10-CM

## 2017-05-25 DIAGNOSIS — T380X5A Adverse effect of glucocorticoids and synthetic analogues, initial encounter: Secondary | ICD-10-CM

## 2017-05-25 LAB — COMPREHENSIVE METABOLIC PANEL
ALT: 12 U/L (ref 0–55)
AST: 28 U/L (ref 5–34)
Albumin: 2.9 g/dL — ABNORMAL LOW (ref 3.5–5.0)
Alkaline Phosphatase: 122 U/L (ref 40–150)
Anion gap: 7 (ref 3–11)
BILIRUBIN TOTAL: 0.3 mg/dL (ref 0.2–1.2)
BUN: 13 mg/dL (ref 7–26)
CO2: 22 mmol/L (ref 22–29)
CREATININE: 0.73 mg/dL (ref 0.60–1.10)
Calcium: 8.7 mg/dL (ref 8.4–10.4)
Chloride: 110 mmol/L — ABNORMAL HIGH (ref 98–109)
Glucose, Bld: 124 mg/dL (ref 70–140)
Potassium: 3.6 mmol/L (ref 3.5–5.1)
Sodium: 139 mmol/L (ref 136–145)
TOTAL PROTEIN: 6.3 g/dL — AB (ref 6.4–8.3)

## 2017-05-25 LAB — CBC WITH DIFFERENTIAL/PLATELET
BASOS ABS: 0.1 10*3/uL (ref 0.0–0.1)
Basophils Relative: 2 %
EOS PCT: 7 %
Eosinophils Absolute: 0.3 10*3/uL (ref 0.0–0.5)
HEMATOCRIT: 26.9 % — AB (ref 34.8–46.6)
Hemoglobin: 8.8 g/dL — ABNORMAL LOW (ref 11.6–15.9)
LYMPHS ABS: 0.9 10*3/uL (ref 0.9–3.3)
LYMPHS PCT: 20 %
MCH: 29.9 pg (ref 25.1–34.0)
MCHC: 32.8 g/dL (ref 31.5–36.0)
MCV: 91.1 fL (ref 79.5–101.0)
MONO ABS: 0.7 10*3/uL (ref 0.1–0.9)
Monocytes Relative: 15 %
NEUTROS ABS: 2.6 10*3/uL (ref 1.5–6.5)
Neutrophils Relative %: 56 %
PLATELETS: 254 10*3/uL (ref 145–400)
RBC: 2.95 MIL/uL — AB (ref 3.70–5.45)
RDW: 15.2 % — AB (ref 11.2–14.5)
WBC: 4.7 10*3/uL (ref 3.9–10.3)

## 2017-05-25 LAB — HEMOGLOBIN A1C
HEMOGLOBIN A1C: 5 % (ref 4.8–5.6)
Mean Plasma Glucose: 96.8 mg/dL

## 2017-05-25 LAB — PREGNANCY, URINE: PREG TEST UR: NEGATIVE

## 2017-05-25 MED ORDER — SODIUM CHLORIDE 0.9 % IV SOLN
Freq: Once | INTRAVENOUS | Status: AC
  Administered 2017-05-25: 16:00:00 via INTRAVENOUS

## 2017-05-25 MED ORDER — SODIUM CHLORIDE 0.9 % IJ SOLN
10.0000 mL | INTRAMUSCULAR | Status: DC | PRN
  Administered 2017-05-25: 10 mL via INTRAVENOUS
  Filled 2017-05-25: qty 10

## 2017-05-25 MED ORDER — SODIUM CHLORIDE 0.9% FLUSH
10.0000 mL | INTRAVENOUS | Status: DC | PRN
  Administered 2017-05-25: 10 mL
  Filled 2017-05-25: qty 10

## 2017-05-25 MED ORDER — PROCHLORPERAZINE MALEATE 10 MG PO TABS
ORAL_TABLET | ORAL | Status: AC
  Filled 2017-05-25: qty 1

## 2017-05-25 MED ORDER — HEPARIN SOD (PORK) LOCK FLUSH 100 UNIT/ML IV SOLN
500.0000 [IU] | Freq: Once | INTRAVENOUS | Status: AC | PRN
  Administered 2017-05-25: 500 [IU]
  Filled 2017-05-25: qty 5

## 2017-05-25 MED ORDER — DEXTROSE 5 % IV SOLN
56.0000 mg/m2 | Freq: Once | INTRAVENOUS | Status: AC
  Administered 2017-05-25: 90 mg via INTRAVENOUS
  Filled 2017-05-25: qty 15

## 2017-05-25 MED ORDER — PROCHLORPERAZINE MALEATE 10 MG PO TABS
10.0000 mg | ORAL_TABLET | Freq: Once | ORAL | Status: AC
  Administered 2017-05-25: 10 mg via ORAL

## 2017-05-25 MED ORDER — SODIUM CHLORIDE 0.9 % IV SOLN
Freq: Once | INTRAVENOUS | Status: AC
  Administered 2017-05-25: 15:00:00 via INTRAVENOUS

## 2017-05-25 NOTE — Patient Instructions (Addendum)
[place] Discharge Instructions for Patients Receiving Chemotherapy  Today you received the following chemotherapy agents Kyprolis  To help prevent nausea and vomiting after your treatment, we encourage you to take your nausea medication as directed   If you develop nausea and vomiting that is not controlled by your nausea medication, call the clinic.   BELOW ARE SYMPTOMS THAT SHOULD BE REPORTED IMMEDIATELY:  *FEVER GREATER THAN 100.5 F  *CHILLS WITH OR WITHOUT FEVER  NAUSEA AND VOMITING THAT IS NOT CONTROLLED WITH YOUR NAUSEA MEDICATION  *UNUSUAL SHORTNESS OF BREATH  *UNUSUAL BRUISING OR BLEEDING  TENDERNESS IN MOUTH AND THROAT WITH OR WITHOUT PRESENCE OF ULCERS  *URINARY PROBLEMS  *BOWEL PROBLEMS  UNUSUAL RASH Items with * indicate a potential emergency and should be followed up as soon as possible.  Feel free to call the clinic should you have any questions or concerns. The clinic phone number is [phone].  Please show the [facility] at check-in to the Emergency Department and triage nurse.  Carfilzomib injection ?y l thu?c g? CARFILZOMIB nh?m ??n m?t protein ??c hi?u trong cc t? bo ung th? v lm cho t? bo ung th? ng?ng pht tri?n. Thu?c ???c dng ?? ?i?u tr? b?nh ?a u t?y (multiple myeloma). Thu?c ny c th? ???c dng cho nh?ng m?c ?ch khc; hy h?i ng??i cung c?p d?ch v? y t? ho?c d??c s? c?a mnh, n?u qu v? c th?c m?c. (CC) NHN HI?U PH? BI?N: KYPROLIS Ti c?n ph?i bo cho ng??i cung c?p d?ch v? y t? c?a mnh ?i?u g tr??c khi dng thu?c ny? H? c?n bi?t li?u qu v? c b?t k? tnh tr?ng no sau ?y khng: -b?nh tim -ti?n s? c mu ?ng c?c -nh?p tim khng ??u -b?nh th?n -b?nh gan -b?nh ph?i ho?c h h?p -pha?n ??ng b?t th???ng ho??c di? ??ng v??i carfilzomib -pha?n ??ng b?t th???ng ho??c di? ??ng v??i ca?c d??c ph?m kha?c -pha?n ??ng b?t th???ng ho??c di? ??ng v??i th??c ph?m, thu?c nhu?m, ho??c ch?t ba?o  qua?n -?ang c thai ho??c ??nh co? thai -?ang cho con bu? Ti nn s? d?ng thu?c ny nh? th? no? Thu?c ny ?? tim ho?c truy?n vo t?nh m?ch. Thu?c ny ???c s? d?ng b?i chuyn vin y t? ? b?nh vi?n ho?c ? phng m?ch. Hy bn v?i bc s? nhi khoa c?a qu v? v? vi?c dng thu?c ny ? tr? em. C th? c?n ch?m [place] ??c bi?t. Qu li?u: N?u qu v? cho r?ng mnh ? dng qu nhi?u thu?c ny, th hy lin l?c v?i trung tm ki?m sot ch?t ??c ho?c phng c?p c?u ngay l?p t?c. L?U : Thu?c ny ch? dnh ring cho qu v?. Khng chia s? thu?c ny v?i nh?ng ng??i khc. N?u ti l? qun m?t li?u th sao? ?i?u quan tr?ng l khng nn b? l? li?u thu?c no. Hy lin l?c v?i bc s? ho?c [place] vin y t? c?a mnh, n?u qu v? khng th? gi? ?ng cu?c h?n khm. Nh?ng g c th? t??ng tc v?i thu?c ny? Cc t??ng tc v?i thu?c khng x?y ra. Hy ??a cho ba?c si? ho??c chuyn vin y t? c?a mnh danh sch t?t c? cc thu?c, th?o d??c, cc thu?c khng c?n toa, ho?c cc ch? ph?m b? sung ti?t th?c m qu v? dng. C?ng nn bo cho h? bi?t r?ng qu v? c ht thu?c, u?ng r??u, ho?c c s? d?ng ma ty tri php hay khng. Vi th? c th? t??ng tc v?i thu?c  na?y. Danh sch ny c th? khng m t? ?? h?t cc t??ng tc c th? x?y ra. Hy ??a cho ng??i cung c?p d?ch v? y t? c?a mnh danh sch t?t c? cc thu?c, th?o d??c, cc thu?c khng c?n toa, ho?c cc ch? ph?m b? sung m qu v? dng. C?ng nn bo cho h? bi?t r?ng qu v? c ht thu?c, u?ng r??u, ho?c c s? d?ng ma ty tri php hay khng. Vi th? c th? t??ng tc v?i thu?c c?a qu v?. Ti c?n ph?i theo di ?i?u g trong khi dng thu?c ny? [name] s? ???c theo di ch?t ch? trong khi dng thu?c ny. Hy t??ng trnh m?i tc d?ng ph?. Hy ti?p t?c ??t ?i?u tr? c?a mnh ngay c? khi qu v? c?m th?y m?t, tr? khi bc s? yu c?u qu v? ng?ng ?i?u tr?Sheryl [name] v? s? c?n ?i lm cc xt nghi?m mu ??nh k? trong khi qu v? dng thu?c ny. Khng ???c c thai trong khi dng thu?c ny ho?c trong vng t?i thi?u 30  ngy sau khi ng?ng dng thu?c. Ph? n? c?n ph?i thng bo cho bc s? c?a mnh, n?u mu?n c thai ho?c ngh? r?ng c th? mnh ? c [place]. C nguy c? v? cc tc d?ng ph? nghim tr?ng ??i v?i [place] nhi. Nam gi??i khng nn gy thu? thai trong khi du?ng thu?c na?y va? trong vng 90 ngy sau khi ng?ng dng thu?c. Hy th?o lu?n v?i bc s? ho?c chuyn vin y t? ho?c d??c s? ?? bi?t thm thng tin. Khng ???c nui con b?ng s?a m? trong khi dng thu?c ny. Hy lin l?c v?i bc s? ho?c chuyn vin y t? n?u qu v? b? tiu ch?y n?ng, bu?n i v i m?a, ho?c n?u qu v? b? ra nhi?u m? hi. Tnh tr?ng m?t qu nhi?u d?ch c? th? c th? gy nguy hi?m khi qu v? dng thu?c ny. [name] c th? b? chng m?t. Khng ???c li xe, s? d?ng my mc, ho?c lm nh?ng vi?c c?n ph?i t?nh to cho t?i khi qu v? bi?t ???c thu?c ny ?nh h??ng ln qu v? nh? th? no. Khng ???c ng?i d?y ho?c ??ng d?y nhanh, ??c bi?t l khi qu v? l b?nh nhn l?n tu?i. ?i?u ny lm gi?m nguy c? b? chng m?t ho?c ng?t x?u. Ti c th? nh?n th?y nh?ng tc d?ng ph? no khi dng thu?c ny? Nh?ng tc d?ng ph? qu v? c?n ph?i bo cho bc s? ho?c chuyn vin y t? cng s?m cng t?t: -cc ph?n ?ng d? ?ng, ch?ng h?n nh? da b? m?n ??, ng?a, n?i my ?ay, s?ng ? m?t, mi, ho?c l??i -l l?n -chng m?t -hoa m?t ho?c ng?t x?u -s?t ho?c ?n l?nh -?nh tr?ng ng?c -co gi?t (kinh phong) -cc d?u hi?u v tri?u ch?ng xu?t huy?t, nh? l phn c mu ho?c c mu ?en h?c n; n??c ti?u c mu ?? ho?c mu nu s?m; kh?c ra mu ho?c ra ch?t mu nu gi?ng nh? b?t c ph; cc ??m ?? trn da; cc v?t b?m tm ho?c ch?y mu b?t th??ng ? m?t; n??u r?ng ho?c m?i -cc d?u hi?u v tri?u ch?ng c?a c?c mu ?ng, nh? l cc v?n ?? v? h h?p; thay ??i th? l?c; ?au ng?c; ?au ??u ??t ng?t v d? d?i; ?au, s?ng, nng ? chn; ni kh; t ho?c y?u ??t ng?t ? m?t, tay ho?c chn -cc d?u hi?u v tri?u ch?ng t?n th??ng th?n, nh? kh ?i ti?u  ho?c thay ??i l??ng n??c ti?u -cc d?u hi?u v tri?u ch?ng t?n th??ng  gan, ch?ng h?n nh? n??c ti?u mu nu ho?c vng s?m; c?m gic b? b?nh ki?u chung chung ho?c cc tri?u ch?ng gi?ng nh? cm; phn b?c mu; m?t c?m gic ngon mi?ng; bu?n i; ?au vng b?ng trn; y?u ?t ho?c m?t m?i khc th??ng; vng da ho?c m?t Cc tc d?ng ph? khng c?n ph?i ch?m [place] y t? (hy bo cho bc s? ho?c chuyn vin y t?, n?u cc tc d?ng ph? ny ti?p di?n ho?c gy phi?n toi): -?au l?ng -ho -tiu ch?y -?au ??u -co th?t c? b?p ho?c v?p b? -i m?a Danh sch ny c th? khng m t? ?? h?t cc tc d?ng ph? c th? x?y ra. Xin g?i t?i bc s? c?a mnh ?? ???c c? v?n chuyn mn v? cc tc d?ng ph?Sheryl [name] v? c th? t??ng trnh cc tc d?ng ph? cho FDA theo s? [phone]. Ti nn c?t gi? thu?c c?a mnh ? ?u? Thu?c ny ???c s? d?ng b?i chuyn vin y t? ? b?nh vi?n ho?c ? phng m?ch. [name] s? khng ???c c?p thu?c ny ?? c?t gi? t?i nh. L?U : ?y l b?n tm t?t. N c th? khng bao hm t?t c? thng tin c th? c. N?u qu v? th?c m?c v? thu?c ny, xin trao ??i v?i bc s?, d??c s?, ho?c ng??i cung c?p d?ch v? y t? c?a mnh.  2018 [employer] (2016-02-21 00:00:00)

## 2017-05-27 LAB — PROTEIN ELECTROPHORESIS, SERUM
A/G Ratio: 1 (ref 0.7–1.7)
ALBUMIN ELP: 3 g/dL (ref 2.9–4.4)
Alpha-1-Globulin: 0.3 g/dL (ref 0.0–0.4)
Alpha-2-Globulin: 0.7 g/dL (ref 0.4–1.0)
Beta Globulin: 1 g/dL (ref 0.7–1.3)
GLOBULIN, TOTAL: 3.1 g/dL (ref 2.2–3.9)
Gamma Globulin: 1.1 g/dL (ref 0.4–1.8)
TOTAL PROTEIN ELP: 6.1 g/dL (ref 6.0–8.5)

## 2017-05-29 LAB — IFE, DARA-SPECIFIC, SERUM
IGA: 142 mg/dL (ref 87–352)
IGG (IMMUNOGLOBIN G), SERUM: 1161 mg/dL (ref 700–1600)
IGM (IMMUNOGLOBULIN M), SRM: 56 mg/dL (ref 26–217)

## 2017-06-03 ENCOUNTER — Other Ambulatory Visit: Payer: Self-pay | Admitting: *Deleted

## 2017-06-03 DIAGNOSIS — C9012 Plasma cell leukemia in relapse: Secondary | ICD-10-CM

## 2017-06-03 MED ORDER — POMALIDOMIDE 2 MG PO CAPS: 2.0000 mg | ORAL_CAPSULE | Freq: Every day | ORAL | 0 refills | Status: DC

## 2017-06-03 NOTE — Telephone Encounter (Signed)
This RN received call from [place] stating continued issues of failed faxes for Pomalyst to [phone].  Requested a call to obtain a new fax number for refill.  This RN called [facility] and gave number to MD/nurse pod.  Prescription received and authorization number obtained with last pregnancy test date of 05/25/2017.  Prescription faxed.

## 2017-06-03 NOTE — Progress Notes (Signed)
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      10/10/2014 Miscellaneous    Peripheral blood chemistry and leukocytosis with total white count 78K, comprised of large plasma cells and his normocytic anemia. There is a myeloid left shift with previous surgical radium blasts. Flow cytometry showed 64% plasma cells      10/10/2014 Bone Marrow Biopsy    Markedly hypercellular marrow (95%), Atypical plasma cells comprise 57% of the cellularity. There was diminished multilineage in hematopoiesis with adequate maturation. Breasts less than 1%), no overt dysplasia of the myeloid or erythroid lineages.       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      05/07/2015 - 05/12/2015 Hospital Admission    [facility], [facility] ID workup was negative, EGD showed evidence of gastritis and  duodenitis, no H. pylori or CMV.      05/20/2015 - 05/24/2015 Hospital Admission    [facility]      07/11/2015 Bone Marrow Biopsy    Post transplant 100 a bone marrow biopsy showed hypocellular marrow, 20%, no increase in plasma cells (2%) or other abnormalities.      08/22/2015 - 01/02/2016 Chemotherapy    MaintenanceCyborD (cytoxan 3022mm2 iv, bortezomib 1.5 mg/m, dexamethasone 40 mg, every 2 weeks, changed to Velcade maintenance after 4 months treatment      01/16/2016 - 04/19/2016 Chemotherapy    Maintenance Velcade 1.3 mg/m every 2 weeks      02/22/2016 Pathology Results    BONE MARROW: Diagnosis Bone Marrow, Aspirate,Biopsy, and Clot, right iliac - NORMOCELLULAR BONE MARROW FOR AGE WITH TRILINEAGE HEMATOPOIESIS. - PLASMACYTOSIS (PLASMA CELLS 12%). - SEE COMMENT. PERIPHERAL BLOOD: - OCCASIONAL CIRCULATING PLASMA CELLS.      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       04/18/2016 - 03/26/2017 Chemotherapy    Daratumumab per protocol  CyBorD every week, cytoxan was held after 04/24/2016 dye to cytopenia and infection  -discontinued due to Hep B flare      05/11/2016 - 05/16/2016 Hospital Admission    Healthcare-associated pneumonia      02/16/2017 - 02/21/2017 Hospital Admission    Admission diagnosis: Abnormal LFT's Additional comments: Assoc diagnoses: Hep B flare, transminitis, dehydration, fever, SIRS      04/06/2017 -  Chemotherapy    Carfilzomib 56 mg/m2 on days  1 and 15 (except 60m/m2 on C1D1 and C1D2) with dexamethasone 20 mg on same days and pomalidomide 2 mg on days 1-21 of 28 day cycle           HISTORY OF PRESENTING ILLNESS:  Sheryl [name] 47y.o. female is here because of recently diagnosed plasma cell leukemia. [facility] and is here to establish her local oncological care with [place] she is accompanied to the clinic by her daughter and interpreter.  She presented to our hospital with worsening dyspnea, fatigue, cough, subjective fevers and chills, and was admitted on 09/14/2014. She was found to have allergy WBC 54.1K, hemoglobin 8.1, plt 310, she was treated with symptom management, and was discharged home on 09/17/2014, with a plan to follow-up with hematology. She represents to emergency room on 10/07/2014, was found to have white count of 78K, and worsening anemia with hemoglobin 6. She was seen by my partner Dr. EJonette Evaand plasma cell leukemia was suspected. [facility] Bone marrow biopsy was done, which confirmed plasma cell leukemia, and she started chemotherapy with Velcade, Cytoxan and dexamethasone. She received weekly dose twice, last dose on 10/20/2014. She was discharged to home afterwards. She had a mediport placed during her hospitalization.  She has moderate fatigue, low appetite, she lost about 13 lbs in the past few months. She has moderate abdominal pain, 5-6/10, persistent, she does not take any pain meds.   I reviewed her medical records extensively, and discussed her case with Dr. EJorje Guildand BCarrizo Hillprogram coordinator.  CURRENT THERAPY:  carfilzomib 56 mg/m2 on days 1 and 15(except 25mm2 on C1D1 and C1D2)with dexamethasone 20 mg on same days and pomalidomide 2 mg on days 1-21 of 28 day cycleon 04/13/17   INTERIM HISTORY:   Sheryl [name] is here for follow up of her acute plasma cell Leukemia. She presents today accompanied by her translator. She reports urinary urgency onset last night. She states she had to get up every hour last night. She notes to have a subjective fever. She reports some allergy symptoms of rhinorrhea with clear discharge and sinus discomfort. She does feel short of breath every once in awhile.   She has been on this regimen  for 2 months now. She reports during the first cycle she experienced more fatigue but she did well with the second cycle.   On review of systems, pt denies dysuria, or any other complaints at this time. Pertinent positives are listed and detailed within the above HPI.   MEDICAL HISTORY:  Past Medical History:  Diagnosis Date  . Chills with fever    intermittently since d/c from hospital  . Dysuria-frequency syndrome    w/ pink urine  . GERD (gastroesophageal reflux disease)   . Hepatitis   . History of positive PPD    DX 2011--  CXR DONE NO EVIDENCE  . History of ureter stent   . Hydronephrosis, right   . Neuromuscular disorder ([facility])    legs numb intermittently  . 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  . Pneumonia   . Right ureteral stone   . Urosepsis 8/14   [facility]    SURGICAL HISTORY: Past Surgical History:  Procedure Laterality Date  . CYSTOSCOPY W/ URETERAL STENT PLACEMENT Right 09/25/2012   Procedure: CYSTOSCOPY WITH RETROGRADE PYELOGRAM/URETERAL STENT PLACEMENT;  Surgeon: ThAlexis FrockMD;  Location: [facility];  Service: Urology;  Laterality: Right;  . CYSTOSCOPY WITH RETROGRADE PYELOGRAM, URETEROSCOPY AND STENT PLACEMENT Right 10/15/2012   Procedure: CYSTOSCOPY WITH RETROGRADE PYELOGRAM, URETEROSCOPY AND REMOVAL STENT WITH  STENT PLACEMENT;  Surgeon: Alexis Frock, MD;  Location[facility];  Service: Urology;  Laterality: Right;  . ESOPHAGOGASTRODUODENOSCOPY (EGD) WITH PROPOFOL N/A 11/16/2014   Procedure: ESOPHAGOGASTRODUODENOSCOPY (EGD) WITH PROPOFOL;  Surgeon: Milus Banister, MD;  Location: [facility];  Service: Endoscopy;  Laterality: N/A;  . HOLMIUM LASER APPLICATION Right 07/08/5407   Procedure: HOLMIUM LASER APPLICATION;  Surgeon: Alexis Frock, MD;  Location: [facility];  Service: Urology;  Laterality: Right;  . LIVER BIOPSY    . OTHER SURGICAL HISTORY Right    removal of ovarian cyst  . removal of uterine cyst     years ago  .  RIGHT VATS W/ DRAINAGE PEURAL EFFUSION AND BX'S  10-30-2008    SOCIAL HISTORY: Social History   Socioeconomic History  . Marital status: Single    Spouse name: Not on file  . Number of children: 3  . Years of education: Not on file  . Highest education level: Not on file  Occupational History  . Not on file  Social Needs  . Financial resource strain: Not on file  . Food insecurity:    Worry: Not on file    Inability: Not on file  . Transportation needs:    Medical: Not on file    Non-medical: Not on file  Tobacco Use  . Smoking status: Never Smoker  . Smokeless tobacco: Never Used  Substance and Sexual Activity  . Alcohol use: No    Alcohol/week: 0.0 oz  . Drug use: No  . Sexual activity: Not Currently    Birth control/protection: Abstinence  Lifestyle  . Physical activity:    Days per week: Not on file    Minutes per session: Not on file  . Stress: Not on file  Relationships  . Social connections:    Talks on phone: Not on file    Gets together: Not on file    Attends religious service: Not on file    Active member of club or organization: Not on file    Attends meetings of clubs or organizations: Not on file    Relationship status: Not on file  Other Topics Concern  . Not on file  Social History Narrative  . Not on file    FAMILY HISTORY: Family History  Problem Relation Age of Onset  . Stomach cancer Mother   . Lung disease Father   . Asthma Father     ALLERGIES:  has No Known Allergies.  MEDICATIONS:  Current Outpatient Medications  Medication Sig Dispense Refill  . acyclovir (ZOVIRAX) 800 MG tablet Take 1 tablet (800 mg total) by mouth 2 (two) times daily. 60 tablet 2  . dexamethasone (DECADRON) 4 MG tablet Take 5 tablets (20 mg total) by mouth every 14 (fourteen) days. 20 tablet 2  . feeding supplement, ENSURE ENLIVE, (ENSURE ENLIVE) LIQD Take 237 mLs by mouth 2 (two) times daily between meals. 237 mL 12  . metFORMIN (GLUCOPHAGE) 500 MG tablet  Take 1 tablet (500 mg total) by mouth 2 (two) times daily with a meal. 60 tablet 2  . omeprazole (PRILOSEC) 20 MG capsule Take 1 capsule (20 mg total) by mouth daily. 30 capsule 5  . ondansetron (ZOFRAN) 8 MG tablet Take 1 tablet (8 mg total) by mouth every 8 (eight) hours  as needed for nausea or vomiting. 30 tablet 1  . polyethylene glycol (MIRALAX / GLYCOLAX) packet Take 17 g by mouth daily as needed (constipation).     . potassium chloride SA (K-DUR,KLOR-CON) 20 MEQ tablet Take 1 tablet (20 mEq total) by mouth daily. 30 tablet 0  . prochlorperazine (COMPAZINE) 10 MG tablet Take 1 tablet (10 mg total) by mouth every 6 (six) hours as needed for nausea or vomiting. 30 tablet 0  . promethazine (PHENERGAN) 25 MG tablet Take 1 tablet (25 mg total) by mouth every 6 (six) hours as needed for nausea or vomiting. 30 tablet 0  . tenofovir (VIREAD) 300 MG tablet Take 1 tablet (300 mg total) by mouth daily. 30 tablet 6  . ursodiol (ACTIGALL) 300 MG capsule Take 1 capsule (300 mg total) by mouth 2 (two) times daily. 60 capsule 0  . levofloxacin (LEVAQUIN) 500 MG tablet Take 1 tablet (500 mg total) by mouth daily. 7 tablet 0  . pomalidomide (POMALYST) 2 MG capsule Take 1 capsule (2 mg total) by mouth daily. Take with water on days 1-21. Repeat every 28 days. 21 capsule 0   No current facility-administered medications for this visit.    Facility-Administered Medications Ordered in Other Visits  Medication Dose Route Frequency Provider Last Rate Last Dose  . sodium chloride 0.9 % injection 10 mL  10 mL Intravenous PRN Truitt Merle, MD   10 mL at 12/11/15 1532  . sodium chloride 0.9 % injection 10 mL  10 mL Intravenous PRN Truitt Merle, MD   10 mL at 06/11/16 1505  . sodium chloride flush (NS) 0.9 % injection 10 mL  10 mL Intravenous PRN Truitt Merle, MD   10 mL at 10/09/15 1717    REVIEW OF SYSTEMS:   Constitutional: no abnormal night sweats (+) good appetite, energy improved  Eyes: Denies blurriness of vision,  double vision or watery eyes.  Ears, nose, mouth, throat, and face: Denies mucositis  Respiratory: Denies, dyspnea or wheezes  Cardiovascular: Denies palpitation, chest discomfort or lower extremity swelling Gastrointestinal:  Denies nausea, heartburn, reports regular bowel/bladder GU: (+) urinary frequency  Skin: Denies abnormal skin rashes Musculoskeletal: (+) Neck, shoulder and upper arm pain.  Lymphatics: Denies new lymphadenopathy or easy bruising Neurological:Denies numbness, tingling or new weaknesses Behavioral/Psych: Mood is stable, no new changes  All other systems were reviewed with the patient and are negative.  PHYSICAL EXAMINATION:   ECOG PERFORMANCE STATUS: 1-2  Vitals:   06/08/17 1257  BP: 110/73  Pulse: 85  Resp: 17  Temp: 98.4 F (36.9 C)  TempSrc: Oral  SpO2: 100%  Weight: 130 lb 14.4 oz (59.4 kg)  Height: 5' 3" (1.6 m)    GENERAL: She was wrapped with multiple blankets, not in respiratory distress. SKIN: skin color, texture, turgor are normal, no rashes or significant lesions EYES: normal, conjunctiva are pink and non-injected OROPHARYNX:no exudate, no erythema and lips, buccal mucosa NECK: supple, thyroid normal size, non-tender, without nodularity LYMPH:  no palpable lymphadenopathy in the cervical, axillary or inguinal LUNGS: (+) bilateral crackles on the base of her lungs HEART: regular rate & rhythm and no murmurs and no lower extremity edema ABDOMEN:abdomen soft, moderate tenderness in the right upper quadrant of abdomen, no rebound pain, no Murphy sign, no hepatomegaly. Musculoskeletal:no cyanosis of digits and no clubbing  PSYCH: alert & oriented x 3 with fluent speech NEURO: no focal motor/sensory deficits  LABORATORY DATA:  I have reviewed the data as listed CBC Latest Ref Rng &  Units 06/08/2017 05/25/2017 05/11/2017  WBC 3.9 - 10.3 K/uL 4.9 4.7 3.9  Hemoglobin 11.6 - 15.9 g/dL 9.3(L) 8.8(L) 9.5(L)  Hematocrit 34.8 - 46.6 % 28.9(L) 26.9(L)  30.1(L)  Platelets 145 - 400 K/uL 329 254 262    CMP Latest Ref Rng & Units 06/08/2017 05/25/2017 05/11/2017  Glucose 70 - 140 mg/dL 89 124 109  BUN 7 - 26 mg/dL _0 Creatinine 0.60 - 1.10 mg/dL 0.73 0.73 0.70  Sodium 136 - 145 mmol/L 139 139 140  Potassium 3.5 - 5.1 mmol/L 3.6 3.6 3.9  Chloride 98 - 109 mmol/L 110(H) 110(H) 111(H)  CO2 22 - 29 mmol/L _1 Calcium 8.4 - 10.4 mg/dL 8.7 8.7 9.0  Total Protein 6.4 - 8.3 g/dL 6.8 6.3(L) 6.7  Total Bilirubin 0.2 - 1.2 mg/dL 0.3 0.3 0.5  Alkaline Phos 40 - 150 U/L 99 122 102  AST 5 - 34 U/L _2 ALT 0 - 55 U/L _3 SPEP M-protein  09/15/2014: 4.2 10/08/14: 4.6 12/08/2014: 0.2 02/15/2015: not sufficient sample for test   09/11/2015: not observed 10/09/2015: not det   11/06/2015: not det  12/25/2015: not det  02/13/2016: 0.3 03/12/2016: 0.8 04/09/2016: 0.6 05/08/16: 0.1 06/04/16: 0.1 07/02/2016: 0.2 07/09/2016: 0.2 (dara specific IFE negative) 08/05/16: Not observed  09/01/16: Not observed 09/29/16: 0.1(dara specific IFE negative) 10/27/16: Not observed 11/24/16 : Not observed 12/23/16: 0.1 (Dara specific IFE showed IgG monoclonal protein with lambda light chain) 01/21/2017: 0.1 (Dara specific IFE showed IgG monoclonal protein with lambda light chain) 03/26/17: 0.2 04/27/17: 0.1 05/25/17: Not observed   IgG mg/dl 11/03/2014: 3150  12/08/2014:  759 02/14/2014: 860 09/11/2015: 1347 10/09/2015: 1457 11/06/2015: 1504 12/25/2015: 1400 02/13/2016: 1543 03/12/2016: 1860 04/09/2016: 1620 05/08/16: 749 06/04/16: 751 5/30/20187: 796 07/09/2016: 701 08/05/16: 678 09/01/16: 650 09/29/16: 727 10/27/16: 634 11/24/16: 685  12/23/16: 714 01/21/2017: 709 03/26/17: 874 04/27/17: 1067 05/25/17: 1161   Kappa/lambda light chains levels and ration  11/03/14: 0.75, 152, 0.00 12/22/2014: 1.10, 2.60, 0.42 02/15/2015: 9.59,14.35, 0.67 09/11/2015: 2.44, 2.72, 0.90 10/09/2015: 3.09, 3.49, 0.89 11/06/2015: 2.30, 2.62, 0.88 12/25/2015: 2.51, 3.0, 0.84 02/13/2016:  2.64, 15.3, 0.17 03/12/2016: 2.27, 45.5, 0.05 04/09/2016: 3.33, 45.5, 0.07 05/08/2016: 0.49, 1.21, 0.40 06/04/2016: 0.83, 1.11, 0.75 07/02/16: 0.51, 1.43, 0.36 07/23/16: 0.68, 1.02, 0.67 08/05/16: 0.62, 1.09, 0.57 09/01/16: 0.80, 1.21, 0.66 09/29/16: 0.44, 1.07, 0.41 10/27/16: 0.63, 1.44, 0.44 11/24/16: 0.83, 2.13, 0.39 12/23/16: 0.69, 2.31, 0.30 01/21/2017: 0.77, 3.08, 0.25   24 h urine UPEP/IFE and light chain: 11/03/2014: IFE showed a monoclonal IgG heavy chain with associated lambda light chain. M protein was Undetectable    PATHOLOGY   PATHOLOGY REPORT  Bone Marrow (BM) and Peripheral Blood (PB) FINAL PATHOLOGIC DIAGNOSIS  BONE MARROW: 07/11/2015 Hypocellular marrow (20%) with no increase in plasma cells (2%). See comment. PERIPHERAL BLOOD: Mild anemia. No circulating plasma cells identified. See comment and CBC data.  BONE MARROW: 02/22/2016 Diagnosis Bone Marrow, Aspirate,Biopsy, and Clot, right iliac - NORMOCELLULAR BONE MARROW FOR AGE WITH TRILINEAGE HEMATOPOIESIS. - PLASMACYTOSIS (PLASMA CELLS 12%). - SEE COMMENT. PERIPHERAL BLOOD: - OCCASIONAL CIRCULATING PLASMA CELLS.   PROCEDURES  Bone Marrow Biopsy, 01/21/17 Bone Marrow, Aspirate, Biopsy, and Clot - NORMOCELLULAR BONE MARROW FOR AGE WITH TRILINEAGE HEMATOPOIESIS AND 2% PLASMA CELLS. PERIPHERAL BLOOD: - NORMOCYTIC-HYPOCHROMIC ANEMIA.   Colonoscopy by Dr. Ardis Hughs on 11/26/16  IMPRESSION - One 2 mm polyp in the transverse colon, removed with a cold biopsy forceps. Resected and retrieved. -  Small internal and external hemorrhoids. - The examination was otherwise normal on direct and retroflexion views.   RADIOGRAPHIC STUDIES: I have personally reviewed the radiological images as listed and agreed with the findings in the report. Dg Chest 2 View  Result Date: 06/08/2017 CLINICAL DATA:  Upper respiratory infection.  Rule out pneumonia. EXAM: CHEST - 2 VIEW COMPARISON:  April 06, 2017 FINDINGS: Stable right  Port-A-Cath. The heart, hila, and mediastinum are unchanged. No pulmonary nodules or masses. No focal infiltrates. IMPRESSION: No active cardiopulmonary disease. Electronically Signed   By: Dorise Bullion III M.D   On: 06/08/2017 14:01     CXR 05/08/2016 IMPRESSION:  No acute disease.  CT Biopsy 02/22/16 IMPRESSION: 1. Technically successful CT guided right iliac bone core and aspiration biopsy.  ASSESSMENT & PLAN: 47 y.o. [place] woman, presented with anemia and leokocytosis    1.  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 Unfortunately this showed increased plasma cells 12% with additional lambda light chain staining pending. This is relapse of disease. Cytogenetics was normal  -Her M protein and lambda free light chain level has significantly increased in 02/2016, consistent with disease relapse. Her LP showed negative CSF, no evidence of CNS involvement. -I have previously spoken with Dr. Norma Fredrickson, we agreed to change her CyBorD to weekly, and add Daratumumab weekly, starting 04/18/16.   -Continue Zometa every 4 weeks for a total of 2 years (until 11/2017) -Due to her hospitalization for severe cytopenia and infection, Cytoxan has been held. -She restarted weekly Velcade and dexa, and on Dara every 4 weeks, tolerating well, will continue. Her dara-specific IFE was negative for M-protein on 07/07/16 at [facility], she has achieved second CR in 07/2016.  -Her SPEP and IFE detected a small amount M-protein again, the Dara specific immunofixation was positive, this is concerning for recurrence.  We will continue monitor her SPEP and immunofixation every months -Bone Marrow Biopsy on 01/21/2017 showed 2% of  plasma cells, monoclonal, no definitive evidence of plasma neoplasm.  It confirmed complete response. --Due to her severe hep B flare, her treatment has been held since 02/02/2017. Dr. Norma Fredrickson was informed. Her hepatitis B flare is likely related to daratumumab. -She was recently seen by Dr. Norma Fredrickson, she does not have a matched bone marrow, her transplant has been held temporarily.  Dr. Norma Fredrickson recommends switching her treatment to carfilzomib 56 mg/m2 on days 1 and 15 with dexamethasone 20 mg on same days and pomalidomide 2 mg on days 1-21 of 28 day cycle. She has started on 04/13/2017 -Her last labs in 2/21 showed a M spike 0.2, this is likely due to her being off medication for 2 months. Will monitor closely  -She is responding to treatment, her M protein was not observed on 05/25/17 -she has been tolerating treatment well, lab reviewed, moderate anemia with Hgb is 9.3. No need for blood transfusion. Will proceed to cycle 3 day 1 carfilzomib treatment today -will treat her UTI and possible sinus infection today  -F/u in 4 weeks, she is scheduled to see Dr. Norma Fredrickson in 2 weeks  2. Transaminitis and hyperbilirubinemia, secondary to hepatitis B flare -She has developed significant transaminitis and hyperbilirubinemia since January 2019 -She was previously on hepatitis B treatment entecavir  by her GI at [facility], however she was off treatment for 2-3 months in late 2018, due to her issues with refill.  She did refill and restarted at the end of December 2018, her repeated hepatitis B titer was very high in Jan 2019. -Ultrasound of liver and Doppler of liver was negative  -She was hospitalized for this on 02/16/17, and was switched to Tenofovir by Dr. Baxter Flattery. She will continue to be closely followed by ID Dr. Baxter Flattery  -it's resolved now, her total bilirubin, liver enzymes are back to normal now  3. Type 2 diabetes, steroids induced  -She was noticed to have increased blood glucose level lately, with  random blood glucose above 200. She also developed diabetic symptoms. No history of diabetes in the past. -This is likely steroids induced hyperglycemia -I strongly recommended her to avoid any soft drink and sweats, and watch her carbohydrate intake, and exercise more  -The addition of Metformin has been discussed with her PCP.  -I previously instructed her to take metformin every day since she is now taking dexamethasone more frequently.  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 -Continue close monitoring. -We previously discussed restarting to metformin one pill a day while on steroids to see if her levels will stabilize. I previously encouraged her to take it in the morning with breakfast.  -Due to increase in steroids, Pt has been on metformin to twice daily.   -Her BG is better controlled with metformin.  Her last hemoglobin A1c was 5% on May 25, 2017  4. GERD, history of GI bleeding and nausea  -Continue omeprazole daily. We previously discussed steroids can worsen her acid reflux. -She previously had an EGD at [facility] in 2017. This was benign.  -she did not do her colonoscopy because she did not have anyone to accompany her during visit. She has recently seen Dr. Ardis Hughs again, and is scheduled for colonoscopy. -Colonoscopy on 11/26/16 per Dr. Ardis Hughs notable for a polyp in the transverse colon, revealed to be tubular adenoma and negative for high grade dysplasia or malignancy.   5. Urinary tract infection -She has new onset urinary frequency since last night. No dysuria or abnormal urine color. Will get a UA today and possibly prescribe antibiotics if she has a UTI.   7. Allergy vs sinus infection  -she has symptoms of mouth dryness, rhinorrhea,  sinus congestion and ocasional shortness of breath -She has bilateral crackles on the base of her lungs. I recommend a CXR today to rule out PNA.    Plan  -Labs reviewed, proceed with cycle 3 kyrolis (days 1 and 15), dexamethasone 20 mg q2 weeks with chemo, and 2 mg pomalyst days 1-21 every 28 days -Return for f/u in 2 weeks for cycle 3 day 15 kyprolis  -UA due to urinary frequency  -CXR today  -F/u in 4 weeks for Cycle 4, she will see Dr. Norma Fredrickson in 2 weeks  All questions were answered. The patient knows to call the clinic with any problems, questions or concerns.  I spent 20 minutes counseling the patient face to face.  The total time spent in the appointment was 25 minutes and more than 50% was on counseling.   This document serves as a record of services personally performed by Truitt Merle, MD. It was  created on her behalf by Theresia Bough, a trained medical scribe. The creation of this record is based on the scribe's personal observations and the provider's statements to them.   I have reviewed the above documentation for accuracy and completeness, and I agree with the above.    Truitt Merle, MD  06/08/2017   Addendum Her chest x-ray was negative.  Her UA showed positive nitrate, moderate leukocytes, WBC 21-50 and many bacteria.  Urine culture is pending.  I called in Levaquin 500 mg daily for 7 days for UTI and possible sinus fraction, I also suggested her to take Claritin or Benadryl for possible allergy.  I called her daughter after she left the clinic and discussed the above, she voiced good understanding and will relay the message to her mother.   Truitt Merle  06/08/2017

## 2017-06-05 ENCOUNTER — Telehealth: Payer: Self-pay | Admitting: *Deleted

## 2017-06-05 ENCOUNTER — Other Ambulatory Visit: Payer: Self-pay | Admitting: *Deleted

## 2017-06-05 DIAGNOSIS — C9010 Plasma cell leukemia not having achieved remission: Secondary | ICD-10-CM

## 2017-06-05 NOTE — Telephone Encounter (Signed)
Received vm call from daughter, [name] stating that they have not heard from anyone about her pomalyst & wondering if she is to take a break.  She reports doing her survey.  [place] pt's pregnancy test was done too far back & has to be within 7 days of auth.   Called [facility] back & informed of need to repeat pregnancy test & she states pt can't come today & has appt on Monday & is due to start back on pomalyst on Monday.  We will add pregnancy test to Monday appt & call [employer] for auth then.  Dr Burr Medico informed.

## 2017-06-08 ENCOUNTER — Other Ambulatory Visit: Payer: Self-pay

## 2017-06-08 ENCOUNTER — Inpatient Hospital Stay: Payer: Medicaid Other

## 2017-06-08 ENCOUNTER — Telehealth: Payer: Self-pay | Admitting: Hematology

## 2017-06-08 ENCOUNTER — Inpatient Hospital Stay: Payer: Medicaid Other | Attending: Hematology

## 2017-06-08 ENCOUNTER — Ambulatory Visit (HOSPITAL_COMMUNITY)
Admission: RE | Admit: 2017-06-08 | Discharge: 2017-06-08 | Disposition: A | Discharge status: Home / Self Care | Payer: Medicaid Other | Source: Ambulatory Visit | Attending: Hematology | Admitting: Hematology

## 2017-06-08 ENCOUNTER — Inpatient Hospital Stay (HOSPITAL_BASED_OUTPATIENT_CLINIC_OR_DEPARTMENT_OTHER): Payer: Medicaid Other | Admitting: Hematology

## 2017-06-08 ENCOUNTER — Encounter: Payer: Self-pay | Admitting: Hematology

## 2017-06-08 VITALS — BP 110/73 | HR 85 | Temp 98.4°F | Resp 17 | Ht 63.0 in | Wt 130.9 lb

## 2017-06-08 DIAGNOSIS — B181 Chronic viral hepatitis B without delta-agent: Secondary | ICD-10-CM

## 2017-06-08 DIAGNOSIS — C9012 Plasma cell leukemia in relapse: Secondary | ICD-10-CM | POA: Diagnosis present

## 2017-06-08 DIAGNOSIS — N3000 Acute cystitis without hematuria: Secondary | ICD-10-CM | POA: Diagnosis not present

## 2017-06-08 DIAGNOSIS — D649 Anemia, unspecified: Secondary | ICD-10-CM

## 2017-06-08 DIAGNOSIS — C9010 Plasma cell leukemia not having achieved remission: Secondary | ICD-10-CM

## 2017-06-08 DIAGNOSIS — J069 Acute upper respiratory infection, unspecified: Secondary | ICD-10-CM

## 2017-06-08 DIAGNOSIS — Z5112 Encounter for antineoplastic immunotherapy: Secondary | ICD-10-CM | POA: Diagnosis present

## 2017-06-08 DIAGNOSIS — Z95828 Presence of other vascular implants and grafts: Secondary | ICD-10-CM

## 2017-06-08 DIAGNOSIS — E099 Drug or chemical induced diabetes mellitus without complications: Secondary | ICD-10-CM | POA: Diagnosis not present

## 2017-06-08 DIAGNOSIS — T380X5A Adverse effect of glucocorticoids and synthetic analogues, initial encounter: Secondary | ICD-10-CM | POA: Diagnosis not present

## 2017-06-08 LAB — CBC WITH DIFFERENTIAL/PLATELET
Basophils Absolute: 0 10*3/uL (ref 0.0–0.1)
Basophils Relative: 1 %
EOS ABS: 0.1 10*3/uL (ref 0.0–0.5)
EOS PCT: 1 %
HCT: 28.9 % — ABNORMAL LOW (ref 34.8–46.6)
Hemoglobin: 9.3 g/dL — ABNORMAL LOW (ref 11.6–15.9)
LYMPHS ABS: 1.2 10*3/uL (ref 0.9–3.3)
LYMPHS PCT: 24 %
MCH: 28.4 pg (ref 25.1–34.0)
MCHC: 32.2 g/dL (ref 31.5–36.0)
MCV: 88 fL (ref 79.5–101.0)
MONO ABS: 1.1 10*3/uL — AB (ref 0.1–0.9)
Monocytes Relative: 22 %
Neutro Abs: 2.5 10*3/uL (ref 1.5–6.5)
Neutrophils Relative %: 52 %
Platelets: 329 10*3/uL (ref 145–400)
RBC: 3.28 MIL/uL — ABNORMAL LOW (ref 3.70–5.45)
RDW: 15.8 % — AB (ref 11.2–14.5)
WBC: 4.9 10*3/uL (ref 3.9–10.3)

## 2017-06-08 LAB — COMPREHENSIVE METABOLIC PANEL
ALT: 7 U/L (ref 0–55)
ANION GAP: 3 (ref 3–11)
AST: 27 U/L (ref 5–34)
Albumin: 3.2 g/dL — ABNORMAL LOW (ref 3.5–5.0)
Alkaline Phosphatase: 99 U/L (ref 40–150)
BUN: 9 mg/dL (ref 7–26)
CO2: 26 mmol/L (ref 22–29)
CREATININE: 0.73 mg/dL (ref 0.60–1.10)
Calcium: 8.7 mg/dL (ref 8.4–10.4)
Chloride: 110 mmol/L — ABNORMAL HIGH (ref 98–109)
Glucose, Bld: 89 mg/dL (ref 70–140)
POTASSIUM: 3.6 mmol/L (ref 3.5–5.1)
SODIUM: 139 mmol/L (ref 136–145)
Total Bilirubin: 0.3 mg/dL (ref 0.2–1.2)
Total Protein: 6.8 g/dL (ref 6.4–8.3)

## 2017-06-08 LAB — URINALYSIS, COMPLETE (UACMP) WITH MICROSCOPIC
BILIRUBIN URINE: NEGATIVE
Glucose, UA: NEGATIVE mg/dL
KETONES UR: NEGATIVE mg/dL
NITRITE: POSITIVE — AB
PROTEIN: NEGATIVE mg/dL
Specific Gravity, Urine: 1.006 (ref 1.005–1.030)
pH: 6 (ref 5.0–8.0)

## 2017-06-08 LAB — PREGNANCY, URINE: PREG TEST UR: NEGATIVE

## 2017-06-08 MED ORDER — ZOLEDRONIC ACID 4 MG/100ML IV SOLN
4.0000 mg | Freq: Once | INTRAVENOUS | Status: AC
  Administered 2017-06-08: 4 mg via INTRAVENOUS
  Filled 2017-06-08: qty 100

## 2017-06-08 MED ORDER — LEVOFLOXACIN 500 MG PO TABS: 500.0000 mg | ORAL_TABLET | Freq: Every day | ORAL | 0 refills | Status: DC

## 2017-06-08 MED ORDER — PROCHLORPERAZINE MALEATE 10 MG PO TABS
ORAL_TABLET | ORAL | Status: AC
  Filled 2017-06-08: qty 1

## 2017-06-08 MED ORDER — PROCHLORPERAZINE MALEATE 10 MG PO TABS
10.0000 mg | ORAL_TABLET | Freq: Once | ORAL | Status: AC
  Administered 2017-06-08: 10 mg via ORAL

## 2017-06-08 MED ORDER — OMEPRAZOLE 20 MG PO CPDR: 20.0000 mg | DELAYED_RELEASE_CAPSULE | Freq: Every day | ORAL | 5 refills | Status: DC

## 2017-06-08 MED ORDER — HEPARIN SOD (PORK) LOCK FLUSH 100 UNIT/ML IV SOLN
500.0000 [IU] | Freq: Once | INTRAVENOUS | Status: AC | PRN
  Administered 2017-06-08: 500 [IU]
  Filled 2017-06-08: qty 5

## 2017-06-08 MED ORDER — POMALIDOMIDE 2 MG PO CAPS: 2.0000 mg | ORAL_CAPSULE | Freq: Every day | ORAL | 0 refills | Status: DC

## 2017-06-08 MED ORDER — SODIUM CHLORIDE 0.9 % IV SOLN
Freq: Once | INTRAVENOUS | Status: AC
Start: 2017-06-08 — End: 2017-06-08
  Administered 2017-06-08: 14:00:00 via INTRAVENOUS

## 2017-06-08 MED ORDER — SODIUM CHLORIDE 0.9% FLUSH
10.0000 mL | INTRAVENOUS | Status: DC | PRN
  Administered 2017-06-08: 10 mL
  Filled 2017-06-08: qty 10

## 2017-06-08 MED ORDER — DEXTROSE 5 % IV SOLN
56.0000 mg/m2 | Freq: Once | INTRAVENOUS | Status: AC
  Administered 2017-06-08: 90 mg via INTRAVENOUS
  Filled 2017-06-08: qty 15

## 2017-06-08 MED ORDER — DEXAMETHASONE 4 MG PO TABS: 20.0000 mg | ORAL_TABLET | ORAL | 2 refills | Status: DC

## 2017-06-08 MED ORDER — SODIUM CHLORIDE 0.9 % IV SOLN
Freq: Once | INTRAVENOUS | Status: AC
  Administered 2017-06-08: 14:00:00 via INTRAVENOUS

## 2017-06-08 NOTE — Telephone Encounter (Signed)
Scheduled appt per 5/6 los - pt to get an updated schedule in treatmetn area - per Rn tanya to let RN know.

## 2017-06-08 NOTE — Patient Instructions (Signed)
[facility] Discharge Instructions for Patients Receiving Chemotherapy  Today you received the following chemotherapy agents Kyprolis  To help prevent nausea and vomiting after your treatment, we encourage you to take your nausea medication as directed   If you develop nausea and vomiting that is not controlled by your nausea medication, call the clinic.   BELOW ARE SYMPTOMS THAT SHOULD BE REPORTED IMMEDIATELY:  *FEVER GREATER THAN 100.5 F  *CHILLS WITH OR WITHOUT FEVER  NAUSEA AND VOMITING THAT IS NOT CONTROLLED WITH YOUR NAUSEA MEDICATION  *UNUSUAL SHORTNESS OF BREATH  *UNUSUAL BRUISING OR BLEEDING  TENDERNESS IN MOUTH AND THROAT WITH OR WITHOUT PRESENCE OF ULCERS  *URINARY PROBLEMS  *BOWEL PROBLEMS  UNUSUAL RASH Items with * indicate a potential emergency and should be followed up as soon as possible.  Feel free to call the clinic should you have any questions or concerns. The clinic phone number is [phone].  Please show the [place] at check-in to the Emergency Department and triage nurse.  Carfilzomib injection ?y l thu?c g? CARFILZOMIB nh?m ??n m?t protein ??c hi?u trong cc t? bo ung th? v lm cho t? bo ung th? ng?ng pht tri?n. Thu?c ???c dng ?? ?i?u tr? b?nh ?a u t?y (multiple myeloma). Thu?c ny c th? ???c dng cho nh?ng m?c ?ch khc; hy h?i ng??i cung c?p d?ch v? y t? ho?c d??c s? c?a mnh, n?u qu v? c th?c m?c. (CC) NHN HI?U PH? BI?N: [place] Ti c?n ph?i bo cho ng??i cung c?p d?ch v? y t? c?a mnh ?i?u g tr??c khi dng thu?c ny? H? c?n bi?t li?u qu v? c b?t k? tnh tr?ng no sau ?y khng: -b?nh tim -ti?n s? c mu ?ng c?c -nh?p tim khng ??u -b?nh th?n -b?nh gan -b?nh ph?i ho?c h h?p -pha?n ??ng b?t th???ng ho??c di? ??ng v??i carfilzomib -pha?n ??ng b?t th???ng ho??c di? ??ng v??i ca?c d??c ph?m kha?c -pha?n ??ng b?t th???ng ho??c di? ??ng v??i th??c ph?m, thu?c nhu?m, ho??c ch?t ba?o  qua?n -?ang c thai ho??c ??nh co? thai -?ang cho con bu? Ti nn s? d?ng thu?c ny nh? th? no? Thu?c ny ?? tim ho?c truy?n vo t?nh m?ch. Thu?c ny ???c s? d?ng b?i chuyn vin y t? ? b?nh vi?n ho?c ? phng m?ch. Hy bn v?i bc s? nhi khoa c?a qu v? v? vi?c dng thu?c ny ? tr? em. C th? c?n ch?m [place] ??c bi?t. Qu li?u: N?u qu v? cho r?ng mnh ? dng qu nhi?u thu?c ny, th hy lin l?c v?i trung tm ki?m sot ch?t ??c ho?c phng c?p c?u ngay l?p t?c. L?U : Thu?c ny ch? dnh ring cho qu v?. Khng chia s? thu?c ny v?i nh?ng ng??i khc. N?u ti l? qun m?t li?u th sao? ?i?u quan tr?ng l khng nn b? l? li?u thu?c no. Hy lin l?c v?i bc s? ho?c [place] vin y t? c?a mnh, n?u qu v? khng th? gi? ?ng cu?c h?n khm. Nh?ng g c th? t??ng tc v?i thu?c ny? Cc t??ng tc v?i thu?c khng x?y ra. Hy ??a cho ba?c si? ho??c chuyn vin y t? c?a mnh danh sch t?t c? cc thu?c, th?o d??c, cc thu?c khng c?n toa, ho?c cc ch? ph?m b? sung ti?t th?c m qu v? dng. C?ng nn bo cho h? bi?t r?ng qu v? c ht thu?c, u?ng r??u, ho?c c s? d?ng ma ty tri php hay khng. Vi th? c th? t??ng tc v?i thu?c  na?y. Danh sch ny c th? khng m t? ?? h?t cc t??ng tc c th? x?y ra. Hy ??a cho ng??i cung c?p d?ch v? y t? c?a mnh danh sch t?t c? cc thu?c, th?o d??c, cc thu?c khng c?n toa, ho?c cc ch? ph?m b? sung m qu v? dng. C?ng nn bo cho h? bi?t r?ng qu v? c ht thu?c, u?ng r??u, ho?c c s? d?ng ma ty tri php hay khng. Vi th? c th? t??ng tc v?i thu?c c?a qu v?. Ti c?n ph?i theo di ?i?u g trong khi dng thu?c ny? [name] s? ???c theo di ch?t ch? trong khi dng thu?c ny. Hy t??ng trnh m?i tc d?ng ph?. Hy ti?p t?c ??t ?i?u tr? c?a mnh ngay c? khi qu v? c?m th?y m?t, tr? khi bc s? yu c?u qu v? ng?ng ?i?u tr?Sheryl [name] v? s? c?n ?i lm cc xt nghi?m mu ??nh k? trong khi qu v? dng thu?c ny. Khng ???c c thai trong khi dng thu?c ny ho?c trong vng t?i thi?u 30  ngy sau khi ng?ng dng thu?c. Ph? n? c?n ph?i thng bo cho bc s? c?a mnh, n?u mu?n c thai ho?c ngh? r?ng c th? mnh ? c [place]. C nguy c? v? cc tc d?ng ph? nghim tr?ng ??i v?i [place] nhi. Nam gi??i khng nn gy thu? thai trong khi du?ng thu?c na?y va? trong vng 90 ngy sau khi ng?ng dng thu?c. Hy th?o lu?n v?i bc s? ho?c chuyn vin y t? ho?c d??c s? ?? bi?t thm thng tin. Khng ???c nui con b?ng s?a m? trong khi dng thu?c ny. Hy lin l?c v?i bc s? ho?c chuyn vin y t? n?u qu v? b? tiu ch?y n?ng, bu?n i v i m?a, ho?c n?u qu v? b? ra nhi?u m? hi. Tnh tr?ng m?t qu nhi?u d?ch c? th? c th? gy nguy hi?m khi qu v? dng thu?c ny. [name] c th? b? chng m?t. Khng ???c li xe, s? d?ng my mc, ho?c lm nh?ng vi?c c?n ph?i t?nh to cho t?i khi qu v? bi?t ???c thu?c ny ?nh h??ng ln qu v? nh? th? no. Khng ???c ng?i d?y ho?c ??ng d?y nhanh, ??c bi?t l khi qu v? l b?nh nhn l?n tu?i. ?i?u ny lm gi?m nguy c? b? chng m?t ho?c ng?t x?u. Ti c th? nh?n th?y nh?ng tc d?ng ph? no khi dng thu?c ny? Nh?ng tc d?ng ph? qu v? c?n ph?i bo cho bc s? ho?c chuyn vin y t? cng s?m cng t?t: -cc ph?n ?ng d? ?ng, ch?ng h?n nh? da b? m?n ??, ng?a, n?i my ?ay, s?ng ? m?t, mi, ho?c l??i -l l?n -chng m?t -hoa m?t ho?c ng?t x?u -s?t ho?c ?n l?nh -?nh tr?ng ng?c -co gi?t (kinh phong) -cc d?u hi?u v tri?u ch?ng xu?t huy?t, nh? l phn c mu ho?c c mu ?en h?c n; n??c ti?u c mu ?? ho?c mu nu s?m; kh?c ra mu ho?c ra ch?t mu nu gi?ng nh? b?t c ph; cc ??m ?? trn da; cc v?t b?m tm ho?c ch?y mu b?t th??ng ? m?t; n??u r?ng ho?c m?i -cc d?u hi?u v tri?u ch?ng c?a c?c mu ?ng, nh? l cc v?n ?? v? h h?p; thay ??i th? l?c; ?au ng?c; ?au ??u ??t ng?t v d? d?i; ?au, s?ng, nng ? chn; ni kh; t ho?c y?u ??t ng?t ? m?t, tay ho?c chn -cc d?u hi?u v tri?u ch?ng t?n th??ng th?n, nh? kh ?i ti?u  ho?c thay ??i l??ng n??c ti?u -cc d?u hi?u v tri?u ch?ng t?n th??ng  gan, ch?ng h?n nh? n??c ti?u mu nu ho?c vng s?m; c?m gic b? b?nh ki?u chung chung ho?c cc tri?u ch?ng gi?ng nh? cm; phn b?c mu; m?t c?m gic ngon mi?ng; bu?n i; ?au vng b?ng trn; y?u ?t ho?c m?t m?i khc th??ng; vng da ho?c m?t Cc tc d?ng ph? khng c?n ph?i ch?m [place] y t? (hy bo cho bc s? ho?c chuyn vin y t?, n?u cc tc d?ng ph? ny ti?p di?n ho?c gy phi?n toi): -?au l?ng -ho -tiu ch?y -?au ??u -co th?t c? b?p ho?c v?p b? -i m?a Danh sch ny c th? khng m t? ?? h?t cc tc d?ng ph? c th? x?y ra. Xin g?i t?i bc s? c?a mnh ?? ???c c? v?n chuyn mn v? cc tc d?ng ph?Sheryl [name] v? c th? t??ng trnh cc tc d?ng ph? cho FDA theo s? [phone]. Ti nn c?t gi? thu?c c?a mnh ? ?u? Thu?c ny ???c s? d?ng b?i chuyn vin y t? ? b?nh vi?n ho?c ? phng m?ch. [name] s? khng ???c c?p thu?c ny ?? c?t gi? t?i nh. L?U : ?y l b?n tm t?t. N c th? khng bao hm t?t c? thng tin c th? c. N?u qu v? th?c m?c v? thu?c ny, xin trao ??i v?i bc s?, d??c s?, ho?c ng??i cung c?p d?ch v? y t? c?a mnh.  2018 Elsevier/Gold Standard (2016-02-21 00:00:00)

## 2017-06-09 LAB — URINE CULTURE

## 2017-06-16 ENCOUNTER — Other Ambulatory Visit: Payer: Self-pay | Admitting: Behavioral Health

## 2017-06-16 DIAGNOSIS — B181 Chronic viral hepatitis B without delta-agent: Secondary | ICD-10-CM

## 2017-06-18 ENCOUNTER — Telehealth: Payer: Self-pay | Admitting: *Deleted

## 2017-06-18 ENCOUNTER — Other Ambulatory Visit: Payer: Self-pay | Admitting: *Deleted

## 2017-06-18 DIAGNOSIS — B181 Chronic viral hepatitis B without delta-agent: Secondary | ICD-10-CM

## 2017-06-18 MED ORDER — TENOFOVIR DISOPROXIL FUMARATE 300 MG PO TABS: 300.0000 mg | ORAL_TABLET | Freq: Every day | ORAL | 1 refills | Status: DC

## 2017-06-18 NOTE — Telephone Encounter (Signed)
Patient's insurance requires Dispense as Written in order to pay for brand name Viread. Roderic Palau at [facility] is requesting either Dispense as Written authorization or generic authorization for this prescription. RN gave verbal for Dispense as Written, #30 with 1 refill.  Please advise if she needs to stay on name brand or if Generic will be ok for future refills. Lavone Neri, Lanice Schwab, RN

## 2017-06-18 NOTE — Progress Notes (Signed)
Patient's insurance requires Dispense as Written in order to pay for brand name Viread. J.Howell, Lanice Schwab, RN

## 2017-06-20 NOTE — Telephone Encounter (Signed)
Can you call me on Monday. Not sure what is needed..probably from my own sleep deprivation.Marland Kitchen

## 2017-06-22 ENCOUNTER — Other Ambulatory Visit: Payer: Self-pay | Admitting: Hematology

## 2017-06-23 ENCOUNTER — Inpatient Hospital Stay: Payer: Medicaid Other

## 2017-06-23 ENCOUNTER — Inpatient Hospital Stay (HOSPITAL_BASED_OUTPATIENT_CLINIC_OR_DEPARTMENT_OTHER): Payer: Medicaid Other | Admitting: Medical

## 2017-06-23 ENCOUNTER — Other Ambulatory Visit: Payer: Self-pay | Admitting: Medical

## 2017-06-23 VITALS — BP 98/60 | HR 86 | Temp 98.6°F | Resp 18 | Ht 63.0 in | Wt 133.0 lb

## 2017-06-23 DIAGNOSIS — K1379 Other lesions of oral mucosa: Secondary | ICD-10-CM

## 2017-06-23 DIAGNOSIS — Z5112 Encounter for antineoplastic immunotherapy: Secondary | ICD-10-CM | POA: Diagnosis not present

## 2017-06-23 DIAGNOSIS — C9012 Plasma cell leukemia in relapse: Secondary | ICD-10-CM

## 2017-06-23 DIAGNOSIS — C9010 Plasma cell leukemia not having achieved remission: Secondary | ICD-10-CM

## 2017-06-23 DIAGNOSIS — Z95828 Presence of other vascular implants and grafts: Secondary | ICD-10-CM

## 2017-06-23 DIAGNOSIS — R070 Pain in throat: Secondary | ICD-10-CM | POA: Diagnosis not present

## 2017-06-23 LAB — CBC WITH DIFFERENTIAL/PLATELET
BASOS ABS: 0.1 10*3/uL (ref 0.0–0.1)
BASOS PCT: 1 %
EOS ABS: 0.2 10*3/uL (ref 0.0–0.5)
EOS PCT: 4 %
HCT: 29.6 % — ABNORMAL LOW (ref 34.8–46.6)
Hemoglobin: 9 g/dL — ABNORMAL LOW (ref 11.6–15.9)
LYMPHS PCT: 16 %
Lymphs Abs: 0.8 10*3/uL — ABNORMAL LOW (ref 0.9–3.3)
MCH: 27 pg (ref 25.1–34.0)
MCHC: 30.4 g/dL — ABNORMAL LOW (ref 31.5–36.0)
MCV: 88.9 fL (ref 79.5–101.0)
MONO ABS: 0.9 10*3/uL (ref 0.1–0.9)
Monocytes Relative: 17 %
Neutro Abs: 3.3 10*3/uL (ref 1.5–6.5)
Neutrophils Relative %: 62 %
PLATELETS: 251 10*3/uL (ref 145–400)
RBC: 3.33 MIL/uL — AB (ref 3.70–5.45)
RDW: 15.1 % — ABNORMAL HIGH (ref 11.2–14.5)
WBC: 5.3 10*3/uL (ref 3.9–10.3)

## 2017-06-23 LAB — COMPREHENSIVE METABOLIC PANEL
ALBUMIN: 3.1 g/dL — AB (ref 3.5–5.0)
ALK PHOS: 110 U/L (ref 40–150)
ALT: 9 U/L (ref 0–55)
AST: 26 U/L (ref 5–34)
Anion gap: 4 (ref 3–11)
BILIRUBIN TOTAL: 0.3 mg/dL (ref 0.2–1.2)
BUN: 10 mg/dL (ref 7–26)
CALCIUM: 8.7 mg/dL (ref 8.4–10.4)
CO2: 26 mmol/L (ref 22–29)
CREATININE: 0.68 mg/dL (ref 0.60–1.10)
Chloride: 111 mmol/L — ABNORMAL HIGH (ref 98–109)
GFR calc Af Amer: >60 mL/min (ref >60)
GLUCOSE: 106 mg/dL (ref 70–140)
POTASSIUM: 3.5 mmol/L (ref 3.5–5.1)
Sodium: 141 mmol/L (ref 136–145)
TOTAL PROTEIN: 6.5 g/dL (ref 6.4–8.3)

## 2017-06-23 LAB — PREGNANCY, URINE: Preg Test, Ur: NEGATIVE

## 2017-06-23 MED ORDER — SODIUM CHLORIDE 0.9 % IJ SOLN
10.0000 mL | INTRAMUSCULAR | Status: DC | PRN
  Administered 2017-06-23: 10 mL via INTRAVENOUS
  Filled 2017-06-23: qty 10

## 2017-06-23 MED ORDER — SODIUM CHLORIDE 0.9 % IV SOLN
Freq: Once | INTRAVENOUS | Status: AC
  Administered 2017-06-23: 15:00:00 via INTRAVENOUS

## 2017-06-23 MED ORDER — MAGIC MOUTHWASH: 5.0000 mL | Freq: Four times a day (QID) | ORAL | 3 refills | Status: DC | PRN

## 2017-06-23 MED ORDER — CARFILZOMIB CHEMO INJECTION 60 MG
56.0000 mg/m2 | Freq: Once | INTRAVENOUS | Status: AC
  Administered 2017-06-23: 90 mg via INTRAVENOUS
  Filled 2017-06-23: qty 30

## 2017-06-23 MED ORDER — SODIUM CHLORIDE 0.9% FLUSH
10.0000 mL | INTRAVENOUS | Status: DC | PRN
  Administered 2017-06-23: 10 mL
  Filled 2017-06-23: qty 10

## 2017-06-23 MED ORDER — PROCHLORPERAZINE MALEATE 10 MG PO TABS
10.0000 mg | ORAL_TABLET | Freq: Once | ORAL | Status: AC
  Administered 2017-06-23: 10 mg via ORAL

## 2017-06-23 MED ORDER — SODIUM CHLORIDE 0.9 % IV SOLN
Freq: Once | INTRAVENOUS | Status: AC
  Administered 2017-06-23: 14:00:00 via INTRAVENOUS

## 2017-06-23 MED ORDER — PROCHLORPERAZINE MALEATE 10 MG PO TABS
ORAL_TABLET | ORAL | Status: AC
Start: 2017-06-23 — End: ?
  Filled 2017-06-23: qty 1

## 2017-06-23 MED ORDER — HEPARIN SOD (PORK) LOCK FLUSH 100 UNIT/ML IV SOLN
500.0000 [IU] | Freq: Once | INTRAVENOUS | Status: AC | PRN
  Administered 2017-06-23: 500 [IU]
  Filled 2017-06-23: qty 5

## 2017-06-23 NOTE — Patient Instructions (Signed)
[facility] Discharge Instructions for Patients Receiving Chemotherapy  Today you received the following chemotherapy agents:  Kyprolis  To help prevent nausea and vomiting after your treatment, we encourage you to take your nausea medication as prescribed.   If you develop nausea and vomiting that is not controlled by your nausea medication, call the clinic.   BELOW ARE SYMPTOMS THAT SHOULD BE REPORTED IMMEDIATELY:  *FEVER GREATER THAN 100.5 F  *CHILLS WITH OR WITHOUT FEVER  NAUSEA AND VOMITING THAT IS NOT CONTROLLED WITH YOUR NAUSEA MEDICATION  *UNUSUAL SHORTNESS OF BREATH  *UNUSUAL BRUISING OR BLEEDING  TENDERNESS IN MOUTH AND THROAT WITH OR WITHOUT PRESENCE OF ULCERS  *URINARY PROBLEMS  *BOWEL PROBLEMS  UNUSUAL RASH Items with * indicate a potential emergency and should be followed up as soon as possible.  Feel free to call the clinic should you have any questions or concerns. The clinic phone number is [phone].  Please show the CHEMO ALERT CARD at check-in to the Emergency Department and triage nurse.   

## 2017-06-24 ENCOUNTER — Other Ambulatory Visit: Payer: Self-pay | Admitting: *Deleted

## 2017-06-24 DIAGNOSIS — B181 Chronic viral hepatitis B without delta-agent: Secondary | ICD-10-CM

## 2017-06-24 MED ORDER — VIREAD 300 MG PO TABS: 300.0000 mg | ORAL_TABLET | Freq: Every day | ORAL | 11 refills | Status: DC

## 2017-06-24 NOTE — Telephone Encounter (Signed)
Yes please giver her viread 300mg  daily #30 11 refills

## 2017-06-24 NOTE — Telephone Encounter (Signed)
Thank you :)

## 2017-06-24 NOTE — Telephone Encounter (Signed)
If you'd prefer her to be on name brand, I just need to send in the refill with Dispense as Written (DAW) checked. Insurance will cover name brand if you request it.

## 2017-06-26 LAB — PROTEIN ELECTROPHORESIS, SERUM
A/G RATIO SPE: 1.1 (ref 0.7–1.7)
ALBUMIN ELP: 3.3 g/dL (ref 2.9–4.4)
Alpha-1-Globulin: 0.2 g/dL (ref 0.0–0.4)
Alpha-2-Globulin: 0.7 g/dL (ref 0.4–1.0)
BETA GLOBULIN: 0.9 g/dL (ref 0.7–1.3)
Gamma Globulin: 1.1 g/dL (ref 0.4–1.8)
Globulin, Total: 2.9 g/dL (ref 2.2–3.9)
Total Protein ELP: 6.2 g/dL (ref 6.0–8.5)

## 2017-06-26 NOTE — Progress Notes (Signed)
[facility] Progress Note   Sheryl [name] [id number] [birth year] 47 y.o.  Sheryl [name] is managed by Dr. Truitt Merle  Actively treated with chemotherapy: no  Current Therapy: carfilzomib  Last Treated: 06/23/2017 (cycle 3, day 15)  Assessment: Plan:    Oral tenderness   Oral tenderness: The patient was given a prescription for Magic mouthwash.  Please see After Visit Summary for patient specific instructions.  Future Appointments  Date Time Provider [place]  07/06/2017 12:15 PM [facility] LAB 1 [facility] None  07/06/2017 12:30 PM [facility] FLUSH NURSE 2 [facility] None  07/06/2017  1:00 PM Truitt Merle, MD [facility] None  07/06/2017  1:45 PM [facility] B7 [facility] None  08/04/2017  1:45 PM Carlyle Basques, MD [facility]    No orders of the defined types were placed in this encounter.      Subjective:   Patient ID:  Sheryl [name] is a 46 y.o. (DOB 04/14/1970) female.  Chief Complaint: No chief complaint on file.   HPI Sheryl [name] is a 47 year old female with a history of a plasma cell leukemia who is managed by Dr. Truitt Merle and is currently treated with carfilzomib with the patient receiving cycle 3-day 15 today.  She reports having oral tenderness without any open lesions.  She continues to eat and drink normally however she has tenderness in her mouth.  Medications: I have reviewed the patient's current medications.  Allergies: No Known Allergies  Past Medical History:  Diagnosis Date  . Chills with fever    intermittently since d/c from hospital  . Dysuria-frequency syndrome    w/ pink urine  . GERD (gastroesophageal reflux disease)   . Hepatitis   . History of positive PPD    DX 2011--  CXR DONE NO EVIDENCE  . History of ureter stent   . Hydronephrosis, right   . Neuromuscular disorder (HCC)    legs numb intermittently  . 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[place])   . Pneumonia   . Right ureteral stone   . Urosepsis 8/14   admitted to [facility]    Past  Surgical History:  Procedure Laterality Date  . CYSTOSCOPY W/ URETERAL STENT PLACEMENT Right 09/25/2012   Procedure: CYSTOSCOPY WITH RETROGRADE PYELOGRAM/URETERAL STENT PLACEMENT;  Surgeon: Alexis Frock, MD;  Location: [facility];  Service: Urology;  Laterality: Right;  . CYSTOSCOPY WITH RETROGRADE PYELOGRAM, URETEROSCOPY AND STENT PLACEMENT Right 10/15/2012   Procedure: CYSTOSCOPY WITH RETROGRADE PYELOGRAM, URETEROSCOPY AND REMOVAL STENT WITH  STENT PLACEMENT;  Surgeon: Alexis Frock, MD;  Location: [facility];  Service: Urology;  Laterality: Right;  . ESOPHAGOGASTRODUODENOSCOPY (EGD) WITH PROPOFOL N/A 11/16/2014   Procedure: ESOPHAGOGASTRODUODENOSCOPY (EGD) WITH PROPOFOL;  Surgeon: Milus Banister, MD;  Location: [facility];  Service: Endoscopy;  Laterality: N/A;  . HOLMIUM LASER APPLICATION Right 0/63/0160   Procedure: HOLMIUM LASER APPLICATION;  Surgeon: Alexis Frock, MD;  Location[facility];  Service: Urology;  Laterality: Right;  . LIVER BIOPSY    . OTHER SURGICAL HISTORY Right    removal of ovarian cyst  . removal of uterine cyst     years ago  . RIGHT VATS W/ DRAINAGE PEURAL EFFUSION AND BX'S  10-30-2008    Family History  Problem Relation Age of Onset  . Stomach cancer Mother   . Lung disease Father   . Asthma Father     Social History   Socioeconomic History  . Marital status: Single    Spouse name: Not on file  . Number of  children: 3  . Years of education: Not on file  . Highest education level: Not on file  Occupational History  . Not on file  Social Needs  . Financial resource strain: Not on file  . Food insecurity:    Worry: Not on file    Inability: Not on file  . Transportation needs:    Medical: Not on file    Non-medical: Not on file  Tobacco Use  . Smoking status: Never Smoker  . Smokeless tobacco: Never Used  Substance and Sexual Activity  . Alcohol use: No    Alcohol/week: 0.0 oz  . Drug use: No  . Sexual  activity: Not Currently    Birth control/protection: Abstinence  Lifestyle  . Physical activity:    Days per week: Not on file    Minutes per session: Not on file  . Stress: Not on file  Relationships  . Social connections:    Talks on phone: Not on file    Gets together: Not on file    Attends religious service: Not on file    Active member of club or organization: Not on file    Attends meetings of clubs or organizations: Not on file    Relationship status: Not on file  . Intimate partner violence:    Fear of current or ex partner: Not on file    Emotionally abused: Not on file    Physically abused: Not on file    Forced sexual activity: Not on file  Other Topics Concern  . Not on file  Social History Narrative  . Not on file    Past Medical History, Surgical history, Social history, and Family history were reviewed and updated as appropriate.   Please see review of systems for further details on the patient's review from today.   Review of Systems:  Review of Systems  HENT: Positive for sore throat. Negative for mouth sores and trouble swallowing.        Oral tenderness    Objective:   Physical Exam:  There were no vitals taken for this visit. 4 times a day near very well ECOG: 0 to  Physical Exam  Constitutional: No distress.  HENT:  Head: Normocephalic and atraumatic.  The tongue looks erythematous and slightly swollen.  No ulcerations were noted over the tongue, posterior pharynx, buccal membranes, or in the perioral area.    Skin: She is not diaphoretic.    Lab Review:     Component Value Date/Time   NA 141 06/23/2017 1302   NA 140 02/02/2017 1317   K 3.5 06/23/2017 1302   K 4.0 02/02/2017 1317   CL 111 (H) 06/23/2017 1302   CO2 26 06/23/2017 1302   CO2 25 02/02/2017 1317   GLUCOSE 106 06/23/2017 1302   GLUCOSE 121 02/02/2017 1317   BUN 10 06/23/2017 1302   BUN 13.8 02/02/2017 1317   CREATININE 0.68 06/23/2017 1302   CREATININE 0.72 03/03/2017  1533   CREATININE 0.8 02/02/2017 1317   CALCIUM 8.7 06/23/2017 1302   CALCIUM 9.3 02/02/2017 1317   PROT 6.5 06/23/2017 1302   PROT 6.9 02/02/2017 1317   ALBUMIN 3.1 (L) 06/23/2017 1302   ALBUMIN 3.5 02/02/2017 1317   AST 26 06/23/2017 1302   AST 47 (H) 02/02/2017 1317   ALT 9 06/23/2017 1302   ALT 23 02/02/2017 1317   ALKPHOS 110 06/23/2017 1302   ALKPHOS 98 02/02/2017 1317   BILITOT 0.3 06/23/2017 1302   BILITOT <0.22 02/02/2017 1317  GFRNONAA >60 06/23/2017 1302   GFRAA >60 06/23/2017 1302       Component Value Date/Time   WBC 5.3 06/23/2017 1302   RBC 3.33 (L) 06/23/2017 1302   HGB 9.0 (L) 06/23/2017 1302   HGB 10.4 (L) 02/02/2017 1317   HCT 29.6 (L) 06/23/2017 1302   HCT 33.9 (L) 02/02/2017 1317   PLT 251 06/23/2017 1302   PLT 172 02/02/2017 1317   MCV 88.9 06/23/2017 1302   MCV 87.6 02/02/2017 1317   MCH 27.0 06/23/2017 1302   MCHC 30.4 (L) 06/23/2017 1302   RDW 15.1 (H) 06/23/2017 1302   RDW 14.1 02/02/2017 1317   LYMPHSABS 0.8 (L) 06/23/2017 1302   LYMPHSABS 0.7 (L) 02/02/2017 1317   MONOABS 0.9 06/23/2017 1302   MONOABS 0.1 02/02/2017 1317   EOSABS 0.2 06/23/2017 1302   EOSABS 0.0 02/02/2017 1317   BASOSABS 0.1 06/23/2017 1302   BASOSABS 0.0 02/02/2017 1317   -------------------------------  Imaging from last 24 hours (if applicable):  Radiology interpretation: Dg Chest 2 View  Result Date: 06/08/2017 CLINICAL DATA:  Upper respiratory infection.  Rule out pneumonia. EXAM: CHEST - 2 VIEW COMPARISON:  April 06, 2017 FINDINGS: Stable right Port-A-Cath. The heart, hila, and mediastinum are unchanged. No pulmonary nodules or masses. No focal infiltrates. IMPRESSION: No active cardiopulmonary disease. Electronically Signed   By: Dorise Bullion III M.D   On: 06/08/2017 14:01

## 2017-06-30 LAB — IFE, DARA-SPECIFIC, SERUM
IGG (IMMUNOGLOBIN G), SERUM: 1211 mg/dL (ref 700–1600)
IgA: 216 mg/dL (ref 87–352)
IgM (Immunoglobulin M), Srm: 56 mg/dL (ref 26–217)

## 2017-07-06 ENCOUNTER — Telehealth: Payer: Self-pay

## 2017-07-06 ENCOUNTER — Encounter: Payer: Self-pay | Admitting: Hematology

## 2017-07-06 ENCOUNTER — Inpatient Hospital Stay: Payer: Medicaid Other

## 2017-07-06 ENCOUNTER — Inpatient Hospital Stay (HOSPITAL_BASED_OUTPATIENT_CLINIC_OR_DEPARTMENT_OTHER): Payer: Medicaid Other | Admitting: Hematology

## 2017-07-06 ENCOUNTER — Inpatient Hospital Stay: Payer: Medicaid Other | Attending: Hematology

## 2017-07-06 VITALS — BP 123/59 | HR 79 | Temp 97.8°F | Resp 18 | Ht 63.0 in | Wt 132.0 lb

## 2017-07-06 DIAGNOSIS — C9010 Plasma cell leukemia not having achieved remission: Secondary | ICD-10-CM

## 2017-07-06 DIAGNOSIS — C9012 Plasma cell leukemia in relapse: Secondary | ICD-10-CM

## 2017-07-06 DIAGNOSIS — Z95828 Presence of other vascular implants and grafts: Secondary | ICD-10-CM

## 2017-07-06 DIAGNOSIS — K219 Gastro-esophageal reflux disease without esophagitis: Secondary | ICD-10-CM

## 2017-07-06 DIAGNOSIS — T380X5A Adverse effect of glucocorticoids and synthetic analogues, initial encounter: Secondary | ICD-10-CM

## 2017-07-06 DIAGNOSIS — E099 Drug or chemical induced diabetes mellitus without complications: Secondary | ICD-10-CM | POA: Diagnosis not present

## 2017-07-06 DIAGNOSIS — B181 Chronic viral hepatitis B without delta-agent: Secondary | ICD-10-CM | POA: Diagnosis not present

## 2017-07-06 DIAGNOSIS — Z5112 Encounter for antineoplastic immunotherapy: Secondary | ICD-10-CM | POA: Insufficient documentation

## 2017-07-06 LAB — CBC WITH DIFFERENTIAL/PLATELET
Basophils Absolute: 0 10*3/uL (ref 0.0–0.1)
Basophils Relative: 1 %
Eosinophils Absolute: 0.1 10*3/uL (ref 0.0–0.5)
Eosinophils Relative: 3 %
HEMATOCRIT: 30.1 % — AB (ref 34.8–46.6)
Hemoglobin: 9.5 g/dL — ABNORMAL LOW (ref 11.6–15.9)
LYMPHS PCT: 29 %
Lymphs Abs: 1 10*3/uL (ref 0.9–3.3)
MCH: 27 pg (ref 25.1–34.0)
MCHC: 31.5 g/dL (ref 31.5–36.0)
MCV: 85.9 fL (ref 79.5–101.0)
MONO ABS: 0.6 10*3/uL (ref 0.1–0.9)
MONOS PCT: 16 %
NEUTROS ABS: 1.8 10*3/uL (ref 1.5–6.5)
Neutrophils Relative %: 51 %
Platelets: 250 10*3/uL (ref 145–400)
RBC: 3.5 MIL/uL — ABNORMAL LOW (ref 3.70–5.45)
RDW: 16.6 % — AB (ref 11.2–14.5)
WBC: 3.5 10*3/uL — ABNORMAL LOW (ref 3.9–10.3)

## 2017-07-06 LAB — COMPREHENSIVE METABOLIC PANEL
ALBUMIN: 3.4 g/dL — AB (ref 3.5–5.0)
ALK PHOS: 91 U/L (ref 40–150)
ALT: <6 U/L (ref 0–55)
ANION GAP: 6 (ref 3–11)
AST: 27 U/L (ref 5–34)
BUN: 11 mg/dL (ref 7–26)
CALCIUM: 8.8 mg/dL (ref 8.4–10.4)
CO2: 26 mmol/L (ref 22–29)
Chloride: 109 mmol/L (ref 98–109)
Creatinine, Ser: 0.71 mg/dL (ref 0.60–1.10)
GFR calc Af Amer: >60 mL/min (ref >60)
GFR calc non Af Amer: >60 mL/min (ref >60)
GLUCOSE: 95 mg/dL (ref 70–140)
Potassium: 3.6 mmol/L (ref 3.5–5.1)
SODIUM: 141 mmol/L (ref 136–145)
Total Bilirubin: 0.3 mg/dL (ref 0.2–1.2)
Total Protein: 6.8 g/dL (ref 6.4–8.3)

## 2017-07-06 MED ORDER — ZOLEDRONIC ACID 4 MG/100ML IV SOLN
4.0000 mg | Freq: Once | INTRAVENOUS | Status: AC
  Administered 2017-07-06: 4 mg via INTRAVENOUS
  Filled 2017-07-06: qty 100

## 2017-07-06 MED ORDER — SODIUM CHLORIDE 0.9 % IJ SOLN
10.0000 mL | INTRAMUSCULAR | Status: DC | PRN
  Administered 2017-07-06: 10 mL via INTRAVENOUS
  Filled 2017-07-06: qty 10

## 2017-07-06 MED ORDER — HEPARIN SOD (PORK) LOCK FLUSH 100 UNIT/ML IV SOLN
500.0000 [IU] | Freq: Once | INTRAVENOUS | Status: AC | PRN
  Administered 2017-07-06: 500 [IU]
  Filled 2017-07-06: qty 5

## 2017-07-06 MED ORDER — PROCHLORPERAZINE MALEATE 10 MG PO TABS
ORAL_TABLET | ORAL | Status: AC
  Filled 2017-07-06: qty 1

## 2017-07-06 MED ORDER — DEXAMETHASONE 4 MG PO TABS
20.0000 mg | ORAL_TABLET | Freq: Once | ORAL | Status: AC
  Administered 2017-07-06: 20 mg via ORAL

## 2017-07-06 MED ORDER — ACYCLOVIR 800 MG PO TABS: 800.0000 mg | ORAL_TABLET | Freq: Two times a day (BID) | ORAL | 5 refills | Status: DC

## 2017-07-06 MED ORDER — DEXAMETHASONE 4 MG PO TABS
ORAL_TABLET | ORAL | Status: AC
  Filled 2017-07-06: qty 5

## 2017-07-06 MED ORDER — SODIUM CHLORIDE 0.9 % IV SOLN
Freq: Once | INTRAVENOUS | Status: AC
  Administered 2017-07-06: 15:00:00 via INTRAVENOUS

## 2017-07-06 MED ORDER — PROCHLORPERAZINE MALEATE 10 MG PO TABS
10.0000 mg | ORAL_TABLET | Freq: Once | ORAL | Status: AC
  Administered 2017-07-06: 10 mg via ORAL

## 2017-07-06 MED ORDER — SODIUM CHLORIDE 0.9% FLUSH
10.0000 mL | INTRAVENOUS | Status: DC | PRN
  Administered 2017-07-06: 10 mL
  Filled 2017-07-06: qty 10

## 2017-07-06 MED ORDER — POMALIDOMIDE 2 MG PO CAPS: 2.0000 mg | ORAL_CAPSULE | Freq: Every day | ORAL | 0 refills | Status: DC

## 2017-07-06 MED ORDER — DEXTROSE 5 % IV SOLN
56.0000 mg/m2 | Freq: Once | INTRAVENOUS | Status: AC
  Administered 2017-07-06: 90 mg via INTRAVENOUS
  Filled 2017-07-06: qty 30

## 2017-07-06 MED ORDER — SODIUM CHLORIDE 0.9 % IV SOLN
Freq: Once | INTRAVENOUS | Status: AC
  Administered 2017-07-06: 14:00:00 via INTRAVENOUS

## 2017-07-06 NOTE — Progress Notes (Signed)
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      10/10/2014 Miscellaneous    Peripheral blood chemistry and leukocytosis with total white count 78K, comprised of large plasma cells and his normocytic anemia. There is a myeloid left shift with previous surgical radium blasts. Flow cytometry showed 64% plasma cells      10/10/2014 Bone Marrow Biopsy    Markedly hypercellular marrow (95%), Atypical plasma cells comprise 57% of the cellularity. There was diminished multilineage in hematopoiesis with adequate maturation. Breasts less than 1%), no overt dysplasia of the myeloid or erythroid lineages.       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      05/07/2015 - 05/12/2015 Hospital Admission    [facility], [facility]. ID workup was negative, EGD showed evidence of gastritis and  duodenitis, no H. pylori or CMV.      05/20/2015 - 05/24/2015 Hospital Admission    [facility]      07/11/2015 Bone Marrow Biopsy    Post transplant 100 a bone marrow biopsy showed hypocellular marrow, 20%, no increase in plasma cells (2%) or other abnormalities.      08/22/2015 - 01/02/2016 Chemotherapy    MaintenanceCyborD (cytoxan 3039mm2 iv, bortezomib 1.5 mg/m, dexamethasone 40 mg, every 2 weeks, changed to Velcade maintenance after 4 months treatment      01/16/2016 - 04/19/2016 Chemotherapy    Maintenance Velcade 1.3 mg/m every 2 weeks      02/22/2016 Pathology Results    BONE MARROW: Diagnosis Bone Marrow, Aspirate,Biopsy, and Clot, right iliac - NORMOCELLULAR BONE MARROW FOR AGE WITH TRILINEAGE HEMATOPOIESIS. - PLASMACYTOSIS (PLASMA CELLS 12%). - SEE COMMENT. PERIPHERAL BLOOD: - OCCASIONAL CIRCULATING PLASMA CELLS.      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       04/18/2016 - 03/26/2017 Chemotherapy    Daratumumab per protocol  CyBorD every week, cytoxan was held after 04/24/2016 dye to cytopenia and infection  -discontinued due to Hep B flare      05/11/2016 - 05/16/2016 Hospital Admission    Healthcare-associated pneumonia      02/16/2017 - 02/21/2017 Hospital Admission    Admission diagnosis: Abnormal LFT's Additional comments: Assoc diagnoses: Hep B flare, transminitis, dehydration, fever, SIRS      04/06/2017 -  Chemotherapy    Carfilzomib 56 mg/m2 on days  1 and 15 (except 69m/m2 on C1D1 and C1D2) with dexamethasone 20 mg on same days and pomalidomide 2 mg on days 1-21 of 28 day cycle           HISTORY OF PRESENTING ILLNESS:  Sheryl [name] 47y.o. female is here because of recently diagnosed plasma cell leukemia. [facility] and is here to establish her local oncological care with [place] she is accompanied to the clinic by her daughter and interpreter.  She presented to our hospital with worsening dyspnea, fatigue, cough, subjective fevers and chills, and was admitted on 09/14/2014. She was found to have allergy WBC 54.1K, hemoglobin 8.1, plt 310, she was treated with symptom management, and was discharged home on 09/17/2014, with a plan to follow-up with hematology. She represents to emergency room on 10/07/2014, was found to have white count of 78K, and worsening anemia with hemoglobin 6. She was seen by my partner Dr. EJonette Evaand plasma cell leukemia was suspected. [facility]. Bone marrow biopsy was done, which confirmed plasma cell leukemia, and she started chemotherapy with Velcade, Cytoxan and dexamethasone. She received weekly dose twice, last dose on 10/20/2014. She was discharged to home afterwards. She had a mediport placed during her hospitalization.  She has moderate fatigue, low appetite, she lost about 13 lbs in the past few months. She has moderate abdominal pain, 5-6/10, persistent, she does not take any pain meds.   I reviewed her medical records extensively, and discussed her case with Dr. EJorje Guildand [place] coordinator.  CURRENT THERAPY:   carfilzomib 56 mg/m2 on days 1 and 15(except 262mm2 on C1D1 and C1D2)with dexamethasone 20 mg on same days and pomalidomide 2 mg on days 1-21 of 28 day cycleon 04/13/17, dexa stopped on 07/06/2017 due to her CR    INTERIM HISTORY:   Sheryl [name] is here for follow up of her acute plasma cell Leukemia. She presents today accompanied by her sister.  She has been doing well overall lately.  She denies any significant pain, cough, fever, or other symptoms.  She has mild fatigue, appetite is decent.  She is back to work 40 hours a week.  She saw Dr. RoNorma Fredricksonast week, dexamethasone was recommended to stop due to her  computer remission.  Unfortunately she does not have a matched donor, and the recommendation is to continue maintenance therapy.   MEDICAL HISTORY:  Past Medical History:  Diagnosis Date  . Chills with fever    intermittently since d/c from hospital  . Dysuria-frequency syndrome    w/ pink urine  . GERD (gastroesophageal reflux disease)   . Hepatitis   . History of positive PPD    DX 2011--  CXR DONE NO EVIDENCE  . History of ureter stent   . Hydronephrosis, right   . Neuromuscular disorder ([facility])    legs numb intermittently  . 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[place]  . Pneumonia   . Right ureteral stone   . Urosepsis 8/14   admitted to [facility]    SURGICAL HISTORY: Past Surgical History:  Procedure Laterality Date  . CYSTOSCOPY W/ URETERAL STENT PLACEMENT Right 09/25/2012   Procedure: CYSTOSCOPY WITH RETROGRADE PYELOGRAM/URETERAL STENT PLACEMENT;  Surgeon: ThAlexis FrockMD;  Location: [facility];  Service: Urology;  Laterality: Right;  . CYSTOSCOPY WITH RETROGRADE PYELOGRAM, URETEROSCOPY AND STENT PLACEMENT Right 10/15/2012  Procedure: CYSTOSCOPY WITH RETROGRADE PYELOGRAM, URETEROSCOPY AND REMOVAL STENT WITH  STENT PLACEMENT;  Surgeon: Alexis Frock, MD;  [facility];  Service: Urology;  Laterality: Right;  . ESOPHAGOGASTRODUODENOSCOPY (EGD) WITH PROPOFOL N/A 11/16/2014   Procedure: ESOPHAGOGASTRODUODENOSCOPY (EGD) WITH PROPOFOL;  Surgeon: Milus Banister, MD;  Location: [facility];  Service: Endoscopy;  Laterality: N/A;  . HOLMIUM LASER APPLICATION Right 6/96/7893   Procedure: HOLMIUM LASER APPLICATION;  Surgeon: Alexis Frock, MD;  Location: [facility];  Service: Urology;  Laterality: Right;  . LIVER BIOPSY    . OTHER SURGICAL HISTORY Right    removal of ovarian cyst  . removal of uterine cyst     years ago  . RIGHT VATS W/ DRAINAGE PEURAL EFFUSION AND BX'S  10-30-2008    SOCIAL HISTORY: Social History   Socioeconomic History  . Marital  status: Single    Spouse name: Not on file  . Number of children: 3  . Years of education: Not on file  . Highest education level: Not on file  Occupational History  . Not on file  Social Needs  . Financial resource strain: Not on file  . Food insecurity:    Worry: Not on file    Inability: Not on file  . Transportation needs:    Medical: Not on file    Non-medical: Not on file  Tobacco Use  . Smoking status: Never Smoker  . Smokeless tobacco: Never Used  Substance and Sexual Activity  . Alcohol use: No    Alcohol/week: 0.0 oz  . Drug use: No  . Sexual activity: Not Currently    Birth control/protection: Abstinence  Lifestyle  . Physical activity:    Days per week: Not on file    Minutes per session: Not on file  . Stress: Not on file  Relationships  . Social connections:    Talks on phone: Not on file    Gets together: Not on file    Attends religious service: Not on file    Active member of club or organization: Not on file    Attends meetings of clubs or organizations: Not on file    Relationship status: Not on file  Other Topics Concern  . Not on file  Social History Narrative  . Not on file    FAMILY HISTORY: Family History  Problem Relation Age of Onset  . Stomach cancer Mother   . Lung disease Father   . Asthma Father     ALLERGIES:  has No Known Allergies.  MEDICATIONS:  Current Outpatient Medications  Medication Sig Dispense Refill  . acyclovir (ZOVIRAX) 800 MG tablet Take 1 tablet (800 mg total) by mouth 2 (two) times daily. 60 tablet 5  . dexamethasone (DECADRON) 4 MG tablet Take 5 tablets (20 mg total) by mouth every 14 (fourteen) days. 20 tablet 2  . feeding supplement, ENSURE ENLIVE, (ENSURE ENLIVE) LIQD Take 237 mLs by mouth 2 (two) times daily between meals. 237 mL 12  . levofloxacin (LEVAQUIN) 500 MG tablet Take 1 tablet (500 mg total) by mouth daily. 7 tablet 0  . magic mouthwash SOLN Take 5 mLs by mouth 4 (four) times daily as needed for  mouth pain. 240 mL 3  . metFORMIN (GLUCOPHAGE) 500 MG tablet Take 1 tablet (500 mg total) by mouth 2 (two) times daily with a meal. 60 tablet 2  . omeprazole (PRILOSEC) 20 MG capsule Take 1 capsule (20 mg total) by mouth daily. 30 capsule 5  .  ondansetron (ZOFRAN) 8 MG tablet Take 1 tablet (8 mg total) by mouth every 8 (eight) hours as needed for nausea or vomiting. 30 tablet 1  . polyethylene glycol (MIRALAX / GLYCOLAX) packet Take 17 g by mouth daily as needed (constipation).     . pomalidomide (POMALYST) 2 MG capsule Take 1 capsule (2 mg total) by mouth daily. Take with water on days 1-21. Repeat every 28 days. 21 capsule 0  . potassium chloride SA (K-DUR,KLOR-CON) 20 MEQ tablet Take 1 tablet (20 mEq total) by mouth daily. 30 tablet 0  . prochlorperazine (COMPAZINE) 10 MG tablet Take 1 tablet (10 mg total) by mouth every 6 (six) hours as needed for nausea or vomiting. 30 tablet 0  . promethazine (PHENERGAN) 25 MG tablet Take 1 tablet (25 mg total) by mouth every 6 (six) hours as needed for nausea or vomiting. 30 tablet 0  . ursodiol (ACTIGALL) 300 MG capsule Take 1 capsule (300 mg total) by mouth 2 (two) times daily. 60 capsule 0  . VIREAD 300 MG tablet Take 1 tablet (300 mg total) by mouth daily. 30 tablet 11   No current facility-administered medications for this visit.    Facility-Administered Medications Ordered in Other Visits  Medication Dose Route Frequency Provider Last Rate Last Dose  . sodium chloride 0.9 % injection 10 mL  10 mL Intravenous PRN Truitt Merle, MD   10 mL at 12/11/15 1532  . sodium chloride 0.9 % injection 10 mL  10 mL Intravenous PRN Truitt Merle, MD   10 mL at 06/11/16 1505  . sodium chloride flush (NS) 0.9 % injection 10 mL  10 mL Intravenous PRN Truitt Merle, MD   10 mL at 10/09/15 1717    REVIEW OF SYSTEMS:   Constitutional: no abnormal night sweats (+) good appetite, energy improved  Eyes: Denies blurriness of vision, double vision or watery eyes.  Ears, nose, mouth,  throat, and face: Denies mucositis  Respiratory: Denies, dyspnea or wheezes  Cardiovascular: Denies palpitation, chest discomfort or lower extremity swelling Gastrointestinal:  Denies nausea, heartburn, reports regular bowel/bladder GU: (+) urinary frequency  Skin: Denies abnormal skin rashes Musculoskeletal: (+) Neck, shoulder and upper arm pain.  Lymphatics: Denies new lymphadenopathy or easy bruising Neurological:Denies numbness, tingling or new weaknesses Behavioral/Psych: Mood is stable, no new changes  All other systems were reviewed with the patient and are negative.  PHYSICAL EXAMINATION:   ECOG PERFORMANCE STATUS: 1-2  Vitals:   07/06/17 1248  BP: (!) 123/59  Pulse: 79  Resp: 18  Temp: 97.8 F (36.6 C)  TempSrc: Oral  SpO2: 100%  Weight: 132 lb (59.9 kg)  Height: _0  (1.6 m)    GENERAL: She was wrapped with multiple blankets, not in respiratory distress. SKIN: skin color, texture, turgor are normal, no rashes or significant lesions EYES: normal, conjunctiva are pink and non-injected OROPHARYNX:no exudate, no erythema and lips, buccal mucosa NECK: supple, thyroid normal size, non-tender, without nodularity LYMPH:  no palpable lymphadenopathy in the cervical, axillary or inguinal LUNGS: (+) bilateral crackles on the base of her lungs HEART: regular rate & rhythm and no murmurs and no lower extremity edema ABDOMEN:abdomen soft, moderate tenderness in the right upper quadrant of abdomen, no rebound pain, no Murphy sign, no hepatomegaly. Musculoskeletal:no cyanosis of digits and no clubbing  PSYCH: alert & oriented x 3 with fluent speech NEURO: no focal motor/sensory deficits  LABORATORY DATA:  I have reviewed the data as listed CBC Latest Ref Rng & Units 07/06/2017 06/23/2017  06/08/2017  WBC 3.9 - 10.3 K/uL 3.5(L) 5.3 4.9  Hemoglobin 11.6 - 15.9 g/dL 9.5(L) 9.0(L) 9.3(L)  Hematocrit 34.8 - 46.6 % 30.1(L) 29.6(L) 28.9(L)  Platelets 145 - 400 K/uL 250 251 329    CMP  Latest Ref Rng & Units 07/06/2017 06/23/2017 06/08/2017  Glucose 70 - 140 mg/dL 95 106 89  BUN 7 - 26 mg/dL _0 Creatinine 0.60 - 1.10 mg/dL 0.71 0.68 0.73  Sodium 136 - 145 mmol/L 141 141 139  Potassium 3.5 - 5.1 mmol/L 3.6 3.5 3.6  Chloride 98 - 109 mmol/L 109 111(H) 110(H)  CO2 22 - 29 mmol/L _1 Calcium 8.4 - 10.4 mg/dL 8.8 8.7 8.7  Total Protein 6.4 - 8.3 g/dL 6.8 6.5 6.8  Total Bilirubin 0.2 - 1.2 mg/dL 0.3 0.3 0.3  Alkaline Phos 40 - 150 U/L 91 110 99  AST 5 - 34 U/L _2 ALT 0 - 55 U/L <_3 SPEP M-protein  09/15/2014: 4.2 10/08/14: 4.6 12/08/2014: 0.2 02/15/2015: not sufficient sample for test   09/11/2015: not observed 10/09/2015: not det   11/06/2015: not det  12/25/2015: not det  02/13/2016: 0.3 03/12/2016: 0.8 04/09/2016: 0.6 05/08/16: 0.1 06/04/16: 0.1 07/02/2016: 0.2 07/09/2016: 0.2 (dara specific IFE negative) 08/05/16: Not observed  09/01/16: Not observed 09/29/16: 0.1(dara specific IFE negative) 10/27/16: Not observed 11/24/16 : Not observed 12/23/16: 0.1 (Dara specific IFE showed IgG monoclonal protein with lambda light chain) 01/21/2017: 0.1 (Dara specific IFE showed IgG monoclonal protein with lambda light chain) 03/26/17: 0.2 04/27/17: 0.1 05/25/17: Not observed 06/23/17: Not observed   IgG mg/dl  11/03/2014: 3150  12/08/2014:  759 02/14/2014: 860 09/11/2015: 1347 10/09/2015: 1457 11/06/2015: 1504 12/25/2015: 1400 02/13/2016: 1543 03/12/2016: 1860 04/09/2016: 1620 05/08/16: 749 06/04/16: 751 5/30/20187: 796 07/09/2016: 701 08/05/16: 678 09/01/16: 650 09/29/16: 727 10/27/16: 634 11/24/16: 685  12/23/16: 714 01/21/2017: 709 03/26/17: 874 04/27/17: 1067 05/25/17: 1161 06/23/17: 1211   Kappa/lambda light chains levels and ration  11/03/14: 0.75, 152, 0.00 12/22/2014: 1.10, 2.60, 0.42 02/15/2015: 9.59,14.35, 0.67 09/11/2015: 2.44, 2.72, 0.90 10/09/2015: 3.09, 3.49, 0.89 11/06/2015: 2.30, 2.62, 0.88 12/25/2015: 2.51, 3.0, 0.84 02/13/2016: 2.64, 15.3, 0.17 03/12/2016:  2.27, 45.5, 0.05 04/09/2016: 3.33, 45.5, 0.07 05/08/2016: 0.49, 1.21, 0.40 06/04/2016: 0.83, 1.11, 0.75 07/02/16: 0.51, 1.43, 0.36 07/23/16: 0.68, 1.02, 0.67 08/05/16: 0.62, 1.09, 0.57 09/01/16: 0.80, 1.21, 0.66 09/29/16: 0.44, 1.07, 0.41 10/27/16: 0.63, 1.44, 0.44 11/24/16: 0.83, 2.13, 0.39 12/23/16: 0.69, 2.31, 0.30 01/21/2017: 0.77, 3.08, 0.25   24 h urine UPEP/IFE and light chain: 11/03/2014: IFE showed a monoclonal IgG heavy chain with associated lambda light chain. M protein was Undetectable    PATHOLOGY   PATHOLOGY REPORT  Bone Marrow (BM) and Peripheral Blood (PB) FINAL PATHOLOGIC DIAGNOSIS  BONE MARROW: 07/11/2015 Hypocellular marrow (20%) with no increase in plasma cells (2%). See comment. PERIPHERAL BLOOD: Mild anemia. No circulating plasma cells identified. See comment and CBC data.  BONE MARROW: 02/22/2016 Diagnosis Bone Marrow, Aspirate,Biopsy, and Clot, right iliac - NORMOCELLULAR BONE MARROW FOR AGE WITH TRILINEAGE HEMATOPOIESIS. - PLASMACYTOSIS (PLASMA CELLS 12%). - SEE COMMENT. PERIPHERAL BLOOD: - OCCASIONAL CIRCULATING PLASMA CELLS.   PROCEDURES  Bone Marrow Biopsy, 01/21/17 Bone Marrow, Aspirate, Biopsy, and Clot - NORMOCELLULAR BONE MARROW FOR AGE WITH TRILINEAGE HEMATOPOIESIS AND 2% PLASMA CELLS. PERIPHERAL BLOOD: - NORMOCYTIC-HYPOCHROMIC ANEMIA.   Colonoscopy by Dr. Ardis Hughs on 11/26/16  IMPRESSION - One 2 mm polyp in the transverse colon, removed with a cold biopsy forceps. Resected  and retrieved. - Small internal and external hemorrhoids. - The examination was otherwise normal on direct and retroflexion views.   RADIOGRAPHIC STUDIES: I have personally reviewed the radiological images as listed and agreed with the findings in the report. Dg Chest 2 View  Result Date: 06/08/2017 CLINICAL DATA:  Upper respiratory infection.  Rule out pneumonia. EXAM: CHEST - 2 VIEW COMPARISON:  April 06, 2017 FINDINGS: Stable right Port-A-Cath. The heart, hila, and  mediastinum are unchanged. No pulmonary nodules or masses. No focal infiltrates. IMPRESSION: No active cardiopulmonary disease. Electronically Signed   By: Dorise Bullion III M.D   On: 06/08/2017 14:01     CXR 05/08/2016 IMPRESSION:  No acute disease.  CT Biopsy 02/22/16 IMPRESSION: 1. Technically successful CT guided right iliac bone core and aspiration biopsy.  ASSESSMENT & PLAN: 47 y.o. [place] woman, presented with anemia and leokocytosis    1.  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 Unfortunately this showed increased plasma cells 12% with additional lambda light chain staining pending. This is relapse of disease. Cytogenetics was normal  -Her M protein and lambda free light chain level has significantly increased in 02/2016, consistent with disease relapse. Her LP showed negative CSF, no evidence of CNS involvement. -I have previously spoken with Dr. Norma [name], we agreed to change her CyBorD to weekly, and add Daratumumab weekly, starting 04/18/16.   -Continue Zometa every 4 weeks for a total of 2 years (until 11/2017) -Due to her hospitalization for severe cytopenia and infection, Cytoxan has been held. -She restarted weekly Velcade and dexa, and on Dara every 4 weeks, tolerating well, will continue. Her dara-specific IFE was negative for M-protein on 07/07/16 at [facility], she has achieved second CR in 07/2016.  -Her SPEP and IFE detected a small amount M-protein again, the Dara specific immunofixation was positive, this is concerning for recurrence.  We will continue monitor her SPEP and immunofixation every months -Bone Marrow Biopsy on 01/21/2017 showed 2% of plasma cells, monoclonal, no  definitive evidence of plasma neoplasm.  It confirmed complete response. --Due to her severe hep B flare, her treatment has been held since 02/02/2017. Dr. Norma [name] was informed. Her hepatitis B flare is likely related to daratumumab. -She was seen by Dr. Norma [name], she does not have a matched bone marrow, her transplant has been held temporarily.  Dr. Norma [name] recommends switching her treatment to carfilzomib 56 mg/m2 on days 1 and 15 with dexamethasone 20 mg on same days and pomalidomide 2 mg on days 1-21 of 28 day cycle. She has started on 04/13/2017 -Her labs from 2/21 showed a M spike 0.2, this is likely due to her being off medication for 2 months. Will monitor closely  -She is responding to treatment, her M protein was not observed on 05/25/17 -Due to complete remission, diverticulitis recommended to stop dexamethasone, and continue Pomalyst and carfilzomib as maintenance therapy indefinitely. -She has been tolerating treatment well, no significant worsening anemia or other cytopenias.  We will continue treatment, and stop dexamethasone from this week. -Follow-up in 4 weeks.  2. Transaminitis and hyperbilirubinemia, secondary to hepatitis B flare -She has developed significant transaminitis and hyperbilirubinemia since January 2019 -She was previously on hepatitis B treatment entecavir by her GI at [facility], however  she was off treatment for 2-3 months in late 2018, due to her issues with refill.  She did refill and restarted at the end of December 2018, her repeated hepatitis B titer was very high in Jan 2019. -Ultrasound of liver and Doppler of liver was negative  -She was hospitalized for this on 02/16/17, and was switched to Tenofovir by Dr. Baxter Flattery. She will continue to be closely followed by ID Dr. Baxter Flattery  -it's resolved now, her total bilirubin, liver enzymes are back to normal now   3. Type 2 diabetes, steroids induced  -She was noticed to have increased blood glucose level lately, with  random blood glucose above 200. She also developed diabetic symptoms. No history of diabetes in the past. -This is likely steroids induced hyperglycemia -I strongly recommended her to avoid any soft drink and sweats, and watch her carbohydrate intake, and exercise more  -The addition of Metformin has been discussed with her PCP.  -I previously instructed her to take metformin every day since she is now taking dexamethasone more frequently.  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 -Continue close monitoring. -We previously discussed restarting to metformin one pill a day while on steroids to see if her levels will stabilize. I previously encouraged her to take it in the morning with breakfast.  -Due to increase in steroids, Pt has been on metformin to twice daily.   -Her BG is better controlled with metformin.  Her last hemoglobin A1c was 5% on May 25, 2017 -she will stop Dexa, may be able to come of metformin   4. GERD, history of GI bleeding and nausea  -Continue omeprazole daily. We previously discussed steroids can worsen her acid reflux. -She previously had an EGD at [facility] in 2017. This was benign.  -she did not do her colonoscopy because she did not have anyone to accompany her during visit. She has recently seen Dr. Ardis Hughs again, and is scheduled for colonoscopy. -Colonoscopy on 11/26/16 per Dr. Ardis Hughs notable for a polyp in the transverse colon, revealed to be tubular adenoma and negative for high grade dysplasia or malignancy.     Plan  -Lab reviewed, adequate to proceed carfilzomib today, and change every 2 weeks -She will continue Pomalyst 2 mg daily, 3 weeks on, one-week off -We will stop dexamethasone -f/u 4 weeks.   All questions  were answered. The patient knows to call the clinic with any problems, questions or concerns.  I spent 20 minutes counseling the patient face to face.  The total time spent in the appointment was 25 minutes and more than 50% was on counseling.      Truitt Merle, MD  07/06/2017

## 2017-07-06 NOTE — Patient Instructions (Addendum)
[place] Discharge Instructions for Patients Receiving Chemotherapy  Today you received the following chemotherapy agents Kyprolis  To help prevent nausea and vomiting after your treatment, we encourage you to take your nausea medication as directed   If you develop nausea and vomiting that is not controlled by your nausea medication, call the clinic.   BELOW ARE SYMPTOMS THAT SHOULD BE REPORTED IMMEDIATELY:  *FEVER GREATER THAN 100.5 F  *CHILLS WITH OR WITHOUT FEVER  NAUSEA AND VOMITING THAT IS NOT CONTROLLED WITH YOUR NAUSEA MEDICATION  *UNUSUAL SHORTNESS OF BREATH  *UNUSUAL BRUISING OR BLEEDING  TENDERNESS IN MOUTH AND THROAT WITH OR WITHOUT PRESENCE OF ULCERS  *URINARY PROBLEMS  *BOWEL PROBLEMS  UNUSUAL RASH Items with * indicate a potential emergency and should be followed up as soon as possible.  Feel free to call the clinic should you have any questions or concerns. The clinic phone number is [phone].  Please show the [place] at check-in to the Emergency Department and triage nurse.  Carfilzomib injection ?y l thu?c g? CARFILZOMIB nh?m ??n m?t protein ??c hi?u trong cc t? bo ung th? v lm cho t? bo ung th? ng?ng pht tri?n. Thu?c ???c dng ?? ?i?u tr? b?nh ?a u t?y (multiple myeloma). Thu?c ny c th? ???c dng cho nh?ng m?c ?ch khc; hy h?i ng??i cung c?p d?ch v? y t? ho?c d??c s? c?a mnh, n?u qu v? c th?c m?c. (CC) NHN HI?U PH? BI?N: KYPROLIS Ti c?n ph?i bo cho ng??i cung c?p d?ch v? y t? c?a mnh ?i?u g tr??c khi dng thu?c ny? H? c?n bi?t li?u qu v? c b?t k? tnh tr?ng no sau ?y khng: -b?nh tim -ti?n s? c mu ?ng c?c -nh?p tim khng ??u -b?nh th?n -b?nh gan -b?nh ph?i ho?c h h?p -pha?n ??ng b?t th???ng ho??c di? ??ng v??i carfilzomib -pha?n ??ng b?t th???ng ho??c di? ??ng v??i ca?c d??c ph?m kha?c -pha?n ??ng b?t th???ng ho??c di? ??ng v??i th??c ph?m, thu?c nhu?m, ho??c ch?t ba?o  qua?n -?ang c thai ho??c ??nh co? thai -?ang cho con bu? Ti nn s? d?ng thu?c ny nh? th? no? Thu?c ny ?? tim ho?c truy?n vo t?nh m?ch. Thu?c ny ???c s? d?ng b?i chuyn vin y t? ? b?nh vi?n ho?c ? phng m?ch. Hy bn v?i bc s? nhi khoa c?a qu v? v? vi?c dng thu?c ny ? tr? em. C th? c?n ch?m [place] ??c bi?t. Qu li?u: N?u qu v? cho r?ng mnh ? dng qu nhi?u thu?c ny, th hy lin l?c v?i trung tm ki?m sot ch?t ??c ho?c phng c?p c?u ngay l?p t?c. L?U : Thu?c ny ch? dnh ring cho qu v?. Khng chia s? thu?c ny v?i nh?ng ng??i khc. N?u ti l? qun m?t li?u th sao? ?i?u quan tr?ng l khng nn b? l? li?u thu?c no. Hy lin l?c v?i bc s? ho?c [place] vin y t? c?a mnh, n?u qu v? khng th? gi? ?ng cu?c h?n khm. Nh?ng g c th? t??ng tc v?i thu?c ny? Cc t??ng tc v?i thu?c khng x?y ra. Hy ??a cho ba?c si? ho??c chuyn vin y t? c?a mnh danh sch t?t c? cc thu?c, th?o d??c, cc thu?c khng c?n toa, ho?c cc ch? ph?m b? sung ti?t th?c m qu v? dng. C?ng nn bo cho h? bi?t r?ng qu v? c ht thu?c, u?ng r??u, ho?c c s? d?ng ma ty tri php hay khng. Vi th? c th? t??ng tc v?i thu?c  na?y. Danh sch ny c th? khng m t? ?? h?t cc t??ng tc c th? x?y ra. Hy ??a cho ng??i cung c?p d?ch v? y t? c?a mnh danh sch t?t c? cc thu?c, th?o d??c, cc thu?c khng c?n toa, ho?c cc ch? ph?m b? sung m qu v? dng. C?ng nn bo cho h? bi?t r?ng qu v? c ht thu?c, u?ng r??u, ho?c c s? d?ng ma ty tri php hay khng. Vi th? c th? t??ng tc v?i thu?c c?a qu v?. Ti c?n ph?i theo di ?i?u g trong khi dng thu?c ny? [name] s? ???c theo di ch?t ch? trong khi dng thu?c ny. Hy t??ng trnh m?i tc d?ng ph?. Hy ti?p t?c ??t ?i?u tr? c?a mnh ngay c? khi qu v? c?m th?y m?t, tr? khi bc s? yu c?u qu v? ng?ng ?i?u tr?Sheryl [name] v? s? c?n ?i lm cc xt nghi?m mu ??nh k? trong khi qu v? dng thu?c ny. Khng ???c c thai trong khi dng thu?c ny ho?c trong vng t?i thi?u 30  ngy sau khi ng?ng dng thu?c. Ph? n? c?n ph?i thng bo cho bc s? c?a mnh, n?u mu?n c thai ho?c ngh? r?ng c th? mnh ? c [place]. C nguy c? v? cc tc d?ng ph? nghim tr?ng ??i v?i [place] nhi. Nam gi??i khng nn gy thu? thai trong khi du?ng thu?c na?y va? trong vng 90 ngy sau khi ng?ng dng thu?c. Hy th?o lu?n v?i bc s? ho?c chuyn vin y t? ho?c d??c s? ?? bi?t thm thng tin. Khng ???c nui con b?ng s?a m? trong khi dng thu?c ny. Hy lin l?c v?i bc s? ho?c chuyn vin y t? n?u qu v? b? tiu ch?y n?ng, bu?n i v i m?a, ho?c n?u qu v? b? ra nhi?u m? hi. Tnh tr?ng m?t qu nhi?u d?ch c? th? c th? gy nguy hi?m khi qu v? dng thu?c ny. [name] c th? b? chng m?t. Khng ???c li xe, s? d?ng my mc, ho?c lm nh?ng vi?c c?n ph?i t?nh to cho t?i khi qu v? bi?t ???c thu?c ny ?nh h??ng ln qu v? nh? th? no. Khng ???c ng?i d?y ho?c ??ng d?y nhanh, ??c bi?t l khi qu v? l b?nh nhn l?n tu?i. ?i?u ny lm gi?m nguy c? b? chng m?t ho?c ng?t x?u. Ti c th? nh?n th?y nh?ng tc d?ng ph? no khi dng thu?c ny? Nh?ng tc d?ng ph? qu v? c?n ph?i bo cho bc s? ho?c chuyn vin y t? cng s?m cng t?t: -cc ph?n ?ng d? ?ng, ch?ng h?n nh? da b? m?n ??, ng?a, n?i my ?ay, s?ng ? m?t, mi, ho?c l??i -l l?n -chng m?t -hoa m?t ho?c ng?t x?u -s?t ho?c ?n l?nh -?nh tr?ng ng?c -co gi?t (kinh phong) -cc d?u hi?u v tri?u ch?ng xu?t huy?t, nh? l phn c mu ho?c c mu ?en h?c n; n??c ti?u c mu ?? ho?c mu nu s?m; kh?c ra mu ho?c ra ch?t mu nu gi?ng nh? b?t c ph; cc ??m ?? trn da; cc v?t b?m tm ho?c ch?y mu b?t th??ng ? m?t; n??u r?ng ho?c m?i -cc d?u hi?u v tri?u ch?ng c?a c?c mu ?ng, nh? l cc v?n ?? v? h h?p; thay ??i th? l?c; ?au ng?c; ?au ??u ??t ng?t v d? d?i; ?au, s?ng, nng ? chn; ni kh; t ho?c y?u ??t ng?t ? m?t, tay ho?c chn -cc d?u hi?u v tri?u ch?ng t?n th??ng th?n, nh? kh ?i ti?u  ho?c thay ??i l??ng n??c ti?u -cc d?u hi?u v tri?u ch?ng t?n th??ng  gan, ch?ng h?n nh? n??c ti?u mu nu ho?c vng s?m; c?m gic b? b?nh ki?u chung chung ho?c cc tri?u ch?ng gi?ng nh? cm; phn b?c mu; m?t c?m gic ngon mi?ng; bu?n i; ?au vng b?ng trn; y?u ?t ho?c m?t m?i khc th??ng; vng da ho?c m?t Cc tc d?ng ph? khng c?n ph?i ch?m Jessamine y t? (hy bo cho bc s? ho?c chuyn vin y t?, n?u cc tc d?ng ph? ny ti?p di?n ho?c gy phi?n toi): -?au l?ng -ho -tiu ch?y -?au ??u -co th?t c? b?p ho?c v?p b? -i m?a Danh sch ny c th? khng m t? ?? h?t cc tc d?ng ph? c th? x?y ra. Xin g?i t?i bc s? c?a mnh ?? ???c c? v?n chuyn mn v? cc tc d?ng ph?Sheryl [name] v? c th? t??ng trnh cc tc d?ng ph? cho FDA theo s? [phone]. Ti nn c?t gi? thu?c c?a mnh ? ?u? Thu?c ny ???c s? d?ng b?i chuyn vin y t? ? b?nh vi?n ho?c ? phng m?ch. [name] s? khng ???c c?p thu?c ny ?? c?t gi? t?i nh. L?U : ?y l b?n tm t?t. N c th? khng bao hm t?t c? thng tin c th? c. N?u qu v? th?c m?c v? thu?c ny, xin trao ??i v?i bc s?, d??c s?, ho?c ng??i cung c?p d?ch v? y t? c?a mnh.  2018 Elsevier/Gold Standard (2016-02-21 00:00:00)   Zoledronic Acid injection (Hypercalcemia, Oncology) What is this medicine? ZOLEDRONIC ACID (ZOE le dron ik AS id) lowers the amount of calcium loss from bone. It is used to treat too much calcium in your blood from cancer. It is also used to prevent complications of cancer that has spread to the bone. This medicine may be used for other purposes; ask your health care provider or pharmacist if you have questions. COMMON BRAND NAME(S): Zometa What should I tell my health care provider before I take this medicine? They need to know if you have any of these conditions: -aspirin-sensitive asthma -cancer, especially if you are receiving medicines used to treat cancer -dental disease or wear dentures -infection -kidney disease -receiving corticosteroids like dexamethasone or prednisone -an unusual or allergic reaction to  zoledronic acid, other medicines, foods, dyes, or preservatives -pregnant or trying to get pregnant -breast-feeding How should I use this medicine? This medicine is for infusion into a vein. It is given by a health care professional in a hospital or clinic setting. Talk to your pediatrician regarding the use of this medicine in children. Special care may be needed. Overdosage: If you think you have taken too much of this medicine contact a poison control center or emergency room at once. NOTE: This medicine is only for you. Do not share this medicine with others. What if I miss a dose? It is important not to miss your dose. Call your doctor or health care professional if you are unable to keep an appointment. What may interact with this medicine? -certain antibiotics given by injection -NSAIDs, medicines for pain and inflammation, like ibuprofen or naproxen -some diuretics like bumetanide, furosemide -teriparatide -thalidomide This list may not describe all possible interactions. Give your health care provider a list of all the medicines, herbs, non-prescription drugs, or dietary supplements you use. Also tell them if you smoke, drink alcohol, or use illegal drugs. Some items may interact with your medicine. What should I watch for while using this medicine?  Visit your doctor or health care professional for regular checkups. It may be some time before you see the benefit from this medicine. Do not stop taking your medicine unless your doctor tells you to. Your doctor may order blood tests or other tests to see how you are doing. Women should inform their doctor if they wish to become pregnant or think they might be pregnant. There is a potential for serious side effects to an unborn child. Talk to your health care professional or pharmacist for more information. You should make sure that you get enough calcium and vitamin D while you are taking this medicine. Discuss the foods you eat and the  vitamins you take with your health care professional. Some people who take this medicine have severe bone, joint, and/or muscle pain. This medicine may also increase your risk for jaw problems or a broken thigh bone. Tell your doctor right away if you have severe pain in your jaw, bones, joints, or muscles. Tell your doctor if you have any pain that does not go away or that gets worse. Tell your dentist and dental surgeon that you are taking this medicine. You should not have major dental surgery while on this medicine. See your dentist to have a dental exam and fix any dental problems before starting this medicine. Take good care of your teeth while on this medicine. Make sure you see your dentist for regular follow-up appointments. What side effects may I notice from receiving this medicine? Side effects that you should report to your doctor or health care professional as soon as possible: -allergic reactions like skin rash, itching or hives, swelling of the face, lips, or tongue -anxiety, confusion, or depression -breathing problems -changes in vision -eye pain -feeling faint or lightheaded, falls -jaw pain, especially after dental work -mouth sores -muscle cramps, stiffness, or weakness -redness, blistering, peeling or loosening of the skin, including inside the mouth -trouble passing urine or change in the amount of urine Side effects that usually do not require medical attention (report to your doctor or health care professional if they continue or are bothersome): -bone, joint, or muscle pain -constipation -diarrhea -fever -hair loss -irritation at site where injected -loss of appetite -nausea, vomiting -stomach upset -trouble sleeping -trouble swallowing -weak or tired This list may not describe all possible side effects. Call your doctor for medical advice about side effects. You may report side effects to FDA at 1-800-FDA-1088. Where should I keep my medicine? This drug is  given in a hospital or clinic and will not be stored at home. NOTE: This sheet is a summary. It may not cover all possible information. If you have questions about this medicine, talk to your doctor, pharmacist, or health care provider.  2018 Elsevier/Gold Standard (2013-06-18 14:19:39)

## 2017-07-06 NOTE — Telephone Encounter (Signed)
Printed avs and calender of [place] appointment. Per 6/3 los

## 2017-07-06 NOTE — Telephone Encounter (Signed)
Faxed script for Pomalyst to [facility].  

## 2017-07-09 ENCOUNTER — Telehealth: Payer: Self-pay

## 2017-07-09 ENCOUNTER — Other Ambulatory Visit: Payer: Self-pay

## 2017-07-09 DIAGNOSIS — C9010 Plasma cell leukemia not having achieved remission: Secondary | ICD-10-CM

## 2017-07-09 DIAGNOSIS — C9012 Plasma cell leukemia in relapse: Secondary | ICD-10-CM

## 2017-07-09 NOTE — Progress Notes (Signed)
ur

## 2017-07-09 NOTE — Telephone Encounter (Signed)
Called patient explaining the need for urine pregnancy test.  Scheduled for 8:00 on 07/10/17. Need this for Pomalyst, patient verbalized an understanding.

## 2017-07-10 ENCOUNTER — Telehealth: Payer: Self-pay

## 2017-07-10 ENCOUNTER — Inpatient Hospital Stay: Payer: Medicaid Other

## 2017-07-10 ENCOUNTER — Other Ambulatory Visit: Payer: Self-pay

## 2017-07-10 DIAGNOSIS — Z5112 Encounter for antineoplastic immunotherapy: Secondary | ICD-10-CM | POA: Diagnosis not present

## 2017-07-10 DIAGNOSIS — C9010 Plasma cell leukemia not having achieved remission: Secondary | ICD-10-CM

## 2017-07-10 DIAGNOSIS — C9012 Plasma cell leukemia in relapse: Secondary | ICD-10-CM

## 2017-07-10 LAB — CBC WITH DIFFERENTIAL/PLATELET
Basophils Absolute: 0 10*3/uL (ref 0.0–0.1)
Basophils Relative: 0 %
EOS ABS: 0 10*3/uL (ref 0.0–0.5)
EOS PCT: 1 %
HCT: 32.9 % — ABNORMAL LOW (ref 34.8–46.6)
Hemoglobin: 10.3 g/dL — ABNORMAL LOW (ref 11.6–15.9)
LYMPHS ABS: 1.1 10*3/uL (ref 0.9–3.3)
Lymphocytes Relative: 33 %
MCH: 26.9 pg (ref 25.1–34.0)
MCHC: 31.3 g/dL — AB (ref 31.5–36.0)
MCV: 86 fL (ref 79.5–101.0)
MONO ABS: 0.7 10*3/uL (ref 0.1–0.9)
MONOS PCT: 22 %
Neutro Abs: 1.5 10*3/uL (ref 1.5–6.5)
Neutrophils Relative %: 44 %
PLATELETS: 130 10*3/uL — AB (ref 145–400)
RBC: 3.82 MIL/uL (ref 3.70–5.45)
RDW: 16.4 % — ABNORMAL HIGH (ref 11.2–14.5)
WBC: 3.3 10*3/uL — ABNORMAL LOW (ref 3.9–10.3)

## 2017-07-10 LAB — COMPREHENSIVE METABOLIC PANEL
ALT: 12 U/L (ref 0–55)
AST: 34 U/L (ref 5–34)
Albumin: 3.4 g/dL — ABNORMAL LOW (ref 3.5–5.0)
Alkaline Phosphatase: 114 U/L (ref 40–150)
Anion gap: 10 (ref 3–11)
BUN: 11 mg/dL (ref 7–26)
CHLORIDE: 110 mmol/L — AB (ref 98–109)
CO2: 24 mmol/L (ref 22–29)
Calcium: 8.8 mg/dL (ref 8.4–10.4)
Creatinine, Ser: 0.74 mg/dL (ref 0.60–1.10)
GFR calc Af Amer: >60 mL/min (ref >60)
GFR calc non Af Amer: >60 mL/min (ref >60)
GLUCOSE: 79 mg/dL (ref 70–140)
Potassium: 4.1 mmol/L (ref 3.5–5.1)
SODIUM: 144 mmol/L (ref 136–145)
Total Bilirubin: 0.3 mg/dL (ref 0.2–1.2)
Total Protein: 6.8 g/dL (ref 6.4–8.3)

## 2017-07-10 LAB — PREGNANCY, URINE: PREG TEST UR: NEGATIVE

## 2017-07-10 MED ORDER — POMALIDOMIDE 2 MG PO CAPS: 2.0000 mg | ORAL_CAPSULE | Freq: Every day | ORAL | 0 refills | Status: DC

## 2017-07-10 NOTE — Telephone Encounter (Signed)
Faxed script, Celegene authorization, pregnancy test to [facility]

## 2017-07-12 ENCOUNTER — Other Ambulatory Visit: Payer: Self-pay | Admitting: Hematology

## 2017-07-16 ENCOUNTER — Other Ambulatory Visit: Payer: Self-pay

## 2017-07-16 DIAGNOSIS — C9010 Plasma cell leukemia not having achieved remission: Secondary | ICD-10-CM

## 2017-07-20 ENCOUNTER — Inpatient Hospital Stay: Payer: Medicaid Other

## 2017-07-20 VITALS — BP 120/82 | HR 78 | Temp 97.7°F | Resp 16

## 2017-07-20 DIAGNOSIS — C9010 Plasma cell leukemia not having achieved remission: Secondary | ICD-10-CM

## 2017-07-20 DIAGNOSIS — Z5112 Encounter for antineoplastic immunotherapy: Secondary | ICD-10-CM | POA: Diagnosis not present

## 2017-07-20 DIAGNOSIS — C9012 Plasma cell leukemia in relapse: Secondary | ICD-10-CM

## 2017-07-20 DIAGNOSIS — Z95828 Presence of other vascular implants and grafts: Secondary | ICD-10-CM

## 2017-07-20 LAB — CMP (CANCER CENTER ONLY)
ALK PHOS: 124 U/L (ref 40–150)
ALT: 8 U/L (ref 0–55)
AST: 29 U/L (ref 5–34)
Albumin: 3.4 g/dL — ABNORMAL LOW (ref 3.5–5.0)
Anion gap: 5 (ref 3–11)
BUN: 16 mg/dL (ref 7–26)
CO2: 28 mmol/L (ref 22–29)
CREATININE: 0.73 mg/dL (ref 0.60–1.10)
Calcium: 9.2 mg/dL (ref 8.4–10.4)
Chloride: 106 mmol/L (ref 98–109)
GFR, Est AFR Am: >60 mL/min (ref >60)
Glucose, Bld: 98 mg/dL (ref 70–140)
Potassium: 3.9 mmol/L (ref 3.5–5.1)
Sodium: 139 mmol/L (ref 136–145)
TOTAL PROTEIN: 6.6 g/dL (ref 6.4–8.3)

## 2017-07-20 LAB — CBC WITH DIFFERENTIAL (CANCER CENTER ONLY)
BASOS ABS: 0 10*3/uL (ref 0.0–0.1)
Basophils Relative: 1 %
Eosinophils Absolute: 0.1 10*3/uL (ref 0.0–0.5)
Eosinophils Relative: 2 %
HEMATOCRIT: 29.6 % — AB (ref 34.8–46.6)
HEMOGLOBIN: 9.4 g/dL — AB (ref 11.6–15.9)
LYMPHS PCT: 17 %
Lymphs Abs: 0.7 10*3/uL — ABNORMAL LOW (ref 0.9–3.3)
MCH: 26.7 pg (ref 25.1–34.0)
MCHC: 31.6 g/dL (ref 31.5–36.0)
MCV: 84.8 fL (ref 79.5–101.0)
MONO ABS: 0.3 10*3/uL (ref 0.1–0.9)
Monocytes Relative: 8 %
NEUTROS ABS: 2.9 10*3/uL (ref 1.5–6.5)
Neutrophils Relative %: 72 %
Platelet Count: 303 10*3/uL (ref 145–400)
RBC: 3.5 MIL/uL — AB (ref 3.70–5.45)
RDW: 16 % — ABNORMAL HIGH (ref 11.2–14.5)
WBC: 4.1 10*3/uL (ref 3.9–10.3)

## 2017-07-20 LAB — PREGNANCY, URINE: PREG TEST UR: NEGATIVE

## 2017-07-20 MED ORDER — HEPARIN SOD (PORK) LOCK FLUSH 100 UNIT/ML IV SOLN
250.0000 [IU] | Freq: Once | INTRAVENOUS | Status: DC | PRN
  Filled 2017-07-20: qty 5

## 2017-07-20 MED ORDER — SODIUM CHLORIDE 0.9 % IV SOLN: Freq: Once | INTRAVENOUS | Status: DC

## 2017-07-20 MED ORDER — PROCHLORPERAZINE MALEATE 10 MG PO TABS
10.0000 mg | ORAL_TABLET | Freq: Once | ORAL | Status: AC
  Administered 2017-07-20: 10 mg via ORAL

## 2017-07-20 MED ORDER — SODIUM CHLORIDE 0.9 % IJ SOLN
10.0000 mL | INTRAMUSCULAR | Status: DC | PRN
  Administered 2017-07-20: 10 mL via INTRAVENOUS
  Filled 2017-07-20: qty 10

## 2017-07-20 MED ORDER — ALTEPLASE 2 MG IJ SOLR
2.0000 mg | Freq: Once | INTRAMUSCULAR | Status: DC | PRN
  Filled 2017-07-20: qty 2

## 2017-07-20 MED ORDER — SODIUM CHLORIDE 0.9% FLUSH
3.0000 mL | Freq: Once | INTRAVENOUS | Status: DC | PRN
  Filled 2017-07-20: qty 10

## 2017-07-20 MED ORDER — PROCHLORPERAZINE MALEATE 10 MG PO TABS
ORAL_TABLET | ORAL | Status: AC
Start: 2017-07-20 — End: ?
  Filled 2017-07-20: qty 1

## 2017-07-20 MED ORDER — DEXTROSE 5 % IV SOLN
56.0000 mg/m2 | Freq: Once | INTRAVENOUS | Status: AC
  Administered 2017-07-20: 90 mg via INTRAVENOUS
  Filled 2017-07-20: qty 30

## 2017-07-20 MED ORDER — SODIUM CHLORIDE 0.9% FLUSH
10.0000 mL | INTRAVENOUS | Status: DC | PRN
  Administered 2017-07-20: 10 mL
  Filled 2017-07-20: qty 10

## 2017-07-20 MED ORDER — HEPARIN SOD (PORK) LOCK FLUSH 100 UNIT/ML IV SOLN
500.0000 [IU] | Freq: Once | INTRAVENOUS | Status: DC | PRN
Start: 2017-07-20 — End: 2017-07-20
  Filled 2017-07-20: qty 5

## 2017-07-20 MED ORDER — SODIUM CHLORIDE 0.9 % IV SOLN
Freq: Once | INTRAVENOUS | Status: AC
  Administered 2017-07-20: 13:00:00 via INTRAVENOUS

## 2017-07-20 MED ORDER — HEPARIN SOD (PORK) LOCK FLUSH 100 UNIT/ML IV SOLN
500.0000 [IU] | Freq: Once | INTRAVENOUS | Status: AC | PRN
  Administered 2017-07-20: 500 [IU] via INTRAVENOUS
  Filled 2017-07-20: qty 5

## 2017-07-20 MED ORDER — HEPARIN SOD (PORK) LOCK FLUSH 100 UNIT/ML IV SOLN
500.0000 [IU] | Freq: Once | INTRAVENOUS | Status: DC | PRN
  Filled 2017-07-20: qty 5

## 2017-07-20 NOTE — Patient Instructions (Signed)
[place] Discharge Instructions for Patients Receiving Chemotherapy  Today you received the following chemotherapy agents Kyprolis  To help prevent nausea and vomiting after your treatment, we encourage you to take your nausea medication as directed   If you develop nausea and vomiting that is not controlled by your nausea medication, call the clinic.   BELOW ARE SYMPTOMS THAT SHOULD BE REPORTED IMMEDIATELY:  *FEVER GREATER THAN 100.5 F  *CHILLS WITH OR WITHOUT FEVER  NAUSEA AND VOMITING THAT IS NOT CONTROLLED WITH YOUR NAUSEA MEDICATION  *UNUSUAL SHORTNESS OF BREATH  *UNUSUAL BRUISING OR BLEEDING  TENDERNESS IN MOUTH AND THROAT WITH OR WITHOUT PRESENCE OF ULCERS  *URINARY PROBLEMS  *BOWEL PROBLEMS  UNUSUAL RASH Items with * indicate a potential emergency and should be followed up as soon as possible.  Feel free to call the clinic should you have any questions or concerns. The clinic phone number is [phone].  Please show the [place] at check-in to the Emergency Department and triage nurse.  Carfilzomib injection ?y l thu?c g? CARFILZOMIB nh?m ??n m?t protein ??c hi?u trong cc t? bo ung th? v lm cho t? bo ung th? ng?ng pht tri?n. Thu?c ???c dng ?? ?i?u tr? b?nh ?a u t?y (multiple myeloma). Thu?c ny c th? ???c dng cho nh?ng m?c ?ch khc; hy h?i ng??i cung c?p d?ch v? y t? ho?c d??c s? c?a mnh, n?u qu v? c th?c m?c. (CC) NHN HI?U PH? BI?N: KYPROLIS Ti c?n ph?i bo cho ng??i cung c?p d?ch v? y t? c?a mnh ?i?u g tr??c khi dng thu?c ny? H? c?n bi?t li?u qu v? c b?t k? tnh tr?ng no sau ?y khng: -b?nh tim -ti?n s? c mu ?ng c?c -nh?p tim khng ??u -b?nh th?n -b?nh gan -b?nh ph?i ho?c h h?p -pha?n ??ng b?t th???ng ho??c di? ??ng v??i carfilzomib -pha?n ??ng b?t th???ng ho??c di? ??ng v??i ca?c d??c ph?m kha?c -pha?n ??ng b?t th???ng ho??c di? ??ng v??i th??c ph?m, thu?c nhu?m, ho??c ch?t ba?o  qua?n -?ang c thai ho??c ??nh co? thai -?ang cho con bu? Ti nn s? d?ng thu?c ny nh? th? no? Thu?c ny ?? tim ho?c truy?n vo t?nh m?ch. Thu?c ny ???c s? d?ng b?i chuyn vin y t? ? b?nh vi?n ho?c ? phng m?ch. Hy bn v?i bc s? nhi khoa c?a qu v? v? vi?c dng thu?c ny ? tr? em. C th? c?n ch?m [place] ??c bi?t. Qu li?u: N?u qu v? cho r?ng mnh ? dng qu nhi?u thu?c ny, th hy lin l?c v?i trung tm ki?m sot ch?t ??c ho?c phng c?p c?u ngay l?p t?c. L?U : Thu?c ny ch? dnh ring cho qu v?. Khng chia s? thu?c ny v?i nh?ng ng??i khc. N?u ti l? qun m?t li?u th sao? ?i?u quan tr?ng l khng nn b? l? li?u thu?c no. Hy lin l?c v?i bc s? ho?c [place] vin y t? c?a mnh, n?u qu v? khng th? gi? ?ng cu?c h?n khm. Nh?ng g c th? t??ng tc v?i thu?c ny? Cc t??ng tc v?i thu?c khng x?y ra. Hy ??a cho ba?c si? ho??c chuyn vin y t? c?a mnh danh sch t?t c? cc thu?c, th?o d??c, cc thu?c khng c?n toa, ho?c cc ch? ph?m b? sung ti?t th?c m qu v? dng. C?ng nn bo cho h? bi?t r?ng qu v? c ht thu?c, u?ng r??u, ho?c c s? d?ng ma ty tri php hay khng. Vi th? c th? t??ng tc v?i thu?c  na?y. Danh sch ny c th? khng m t? ?? h?t cc t??ng tc c th? x?y ra. Hy ??a cho ng??i cung c?p d?ch v? y t? c?a mnh danh sch t?t c? cc thu?c, th?o d??c, cc thu?c khng c?n toa, ho?c cc ch? ph?m b? sung m qu v? dng. C?ng nn bo cho h? bi?t r?ng qu v? c ht thu?c, u?ng r??u, ho?c c s? d?ng ma ty tri php hay khng. Vi th? c th? t??ng tc v?i thu?c c?a qu v?. Ti c?n ph?i theo di ?i?u g trong khi dng thu?c ny? [name] s? ???c theo di ch?t ch? trong khi dng thu?c ny. Hy t??ng trnh m?i tc d?ng ph?. Hy ti?p t?c ??t ?i?u tr? c?a mnh ngay c? khi qu v? c?m th?y m?t, tr? khi bc s? yu c?u qu v? ng?ng ?i?u tr?Sheryl [name] v? s? c?n ?i lm cc xt nghi?m mu ??nh k? trong khi qu v? dng thu?c ny. Khng ???c c thai trong khi dng thu?c ny ho?c trong vng t?i thi?u 30  ngy sau khi ng?ng dng thu?c. Ph? n? c?n ph?i thng bo cho bc s? c?a mnh, n?u mu?n c thai ho?c ngh? r?ng c th? mnh ? c [place]. C nguy c? v? cc tc d?ng ph? nghim tr?ng ??i v?i [place] nhi. Nam gi??i khng nn gy thu? thai trong khi du?ng thu?c na?y va? trong vng 90 ngy sau khi ng?ng dng thu?c. Hy th?o lu?n v?i bc s? ho?c chuyn vin y t? ho?c d??c s? ?? bi?t thm thng tin. Khng ???c nui con b?ng s?a m? trong khi dng thu?c ny. Hy lin l?c v?i bc s? ho?c chuyn vin y t? n?u qu v? b? tiu ch?y n?ng, bu?n i v i m?a, ho?c n?u qu v? b? ra nhi?u m? hi. Tnh tr?ng m?t qu nhi?u d?ch c? th? c th? gy nguy hi?m khi qu v? dng thu?c ny. [name] c th? b? chng m?t. Khng ???c li xe, s? d?ng my mc, ho?c lm nh?ng vi?c c?n ph?i t?nh to cho t?i khi qu v? bi?t ???c thu?c ny ?nh h??ng ln qu v? nh? th? no. Khng ???c ng?i d?y ho?c ??ng d?y nhanh, ??c bi?t l khi qu v? l b?nh nhn l?n tu?i. ?i?u ny lm gi?m nguy c? b? chng m?t ho?c ng?t x?u. Ti c th? nh?n th?y nh?ng tc d?ng ph? no khi dng thu?c ny? Nh?ng tc d?ng ph? qu v? c?n ph?i bo cho bc s? ho?c chuyn vin y t? cng s?m cng t?t: -cc ph?n ?ng d? ?ng, ch?ng h?n nh? da b? m?n ??, ng?a, n?i my ?ay, s?ng ? m?t, mi, ho?c l??i -l l?n -chng m?t -hoa m?t ho?c ng?t x?u -s?t ho?c ?n l?nh -?nh tr?ng ng?c -co gi?t (kinh phong) -cc d?u hi?u v tri?u ch?ng xu?t huy?t, nh? l phn c mu ho?c c mu ?en h?c n; n??c ti?u c mu ?? ho?c mu nu s?m; kh?c ra mu ho?c ra ch?t mu nu gi?ng nh? b?t c ph; cc ??m ?? trn da; cc v?t b?m tm ho?c ch?y mu b?t th??ng ? m?t; n??u r?ng ho?c m?i -cc d?u hi?u v tri?u ch?ng c?a c?c mu ?ng, nh? l cc v?n ?? v? h h?p; thay ??i th? l?c; ?au ng?c; ?au ??u ??t ng?t v d? d?i; ?au, s?ng, nng ? chn; ni kh; t ho?c y?u ??t ng?t ? m?t, tay ho?c chn -cc d?u hi?u v tri?u ch?ng t?n th??ng th?n, nh? kh ?i ti?u  ho?c thay ??i l??ng n??c ti?u -cc d?u hi?u v tri?u ch?ng t?n th??ng  gan, ch?ng h?n nh? n??c ti?u mu nu ho?c vng s?m; c?m gic b? b?nh ki?u chung chung ho?c cc tri?u ch?ng gi?ng nh? cm; phn b?c mu; m?t c?m gic ngon mi?ng; bu?n i; ?au vng b?ng trn; y?u ?t ho?c m?t m?i khc th??ng; vng da ho?c m?t Cc tc d?ng ph? khng c?n ph?i ch?m Jessamine y t? (hy bo cho bc s? ho?c chuyn vin y t?, n?u cc tc d?ng ph? ny ti?p di?n ho?c gy phi?n toi): -?au l?ng -ho -tiu ch?y -?au ??u -co th?t c? b?p ho?c v?p b? -i m?a Danh sch ny c th? khng m t? ?? h?t cc tc d?ng ph? c th? x?y ra. Xin g?i t?i bc s? c?a mnh ?? ???c c? v?n chuyn mn v? cc tc d?ng ph?Sheryl [name] v? c th? t??ng trnh cc tc d?ng ph? cho FDA theo s? [phone]. Ti nn c?t gi? thu?c c?a mnh ? ?u? Thu?c ny ???c s? d?ng b?i chuyn vin y t? ? b?nh vi?n ho?c ? phng m?ch. [name] s? khng ???c c?p thu?c ny ?? c?t gi? t?i nh. L?U : ?y l b?n tm t?t. N c th? khng bao hm t?t c? thng tin c th? c. N?u qu v? th?c m?c v? thu?c ny, xin trao ??i v?i bc s?, d??c s?, ho?c ng??i cung c?p d?ch v? y t? c?a mnh.  2018 Elsevier/Gold Standard (2016-02-21 00:00:00)   Zoledronic Acid injection (Hypercalcemia, Oncology) What is this medicine? ZOLEDRONIC ACID (ZOE le dron ik AS id) lowers the amount of calcium loss from bone. It is used to treat too much calcium in your blood from cancer. It is also used to prevent complications of cancer that has spread to the bone. This medicine may be used for other purposes; ask your health care provider or pharmacist if you have questions. COMMON BRAND NAME(S): Zometa What should I tell my health care provider before I take this medicine? They need to know if you have any of these conditions: -aspirin-sensitive asthma -cancer, especially if you are receiving medicines used to treat cancer -dental disease or wear dentures -infection -kidney disease -receiving corticosteroids like dexamethasone or prednisone -an unusual or allergic reaction to  zoledronic acid, other medicines, foods, dyes, or preservatives -pregnant or trying to get pregnant -breast-feeding How should I use this medicine? This medicine is for infusion into a vein. It is given by a health care professional in a hospital or clinic setting. Talk to your pediatrician regarding the use of this medicine in children. Special care may be needed. Overdosage: If you think you have taken too much of this medicine contact a poison control center or emergency room at once. NOTE: This medicine is only for you. Do not share this medicine with others. What if I miss a dose? It is important not to miss your dose. Call your doctor or health care professional if you are unable to keep an appointment. What may interact with this medicine? -certain antibiotics given by injection -NSAIDs, medicines for pain and inflammation, like ibuprofen or naproxen -some diuretics like bumetanide, furosemide -teriparatide -thalidomide This list may not describe all possible interactions. Give your health care provider a list of all the medicines, herbs, non-prescription drugs, or dietary supplements you use. Also tell them if you smoke, drink alcohol, or use illegal drugs. Some items may interact with your medicine. What should I watch for while using this medicine?  Visit your doctor or health care professional for regular checkups. It may be some time before you see the benefit from this medicine. Do not stop taking your medicine unless your doctor tells you to. Your doctor may order blood tests or other tests to see how you are doing. Women should inform their doctor if they wish to become pregnant or think they might be pregnant. There is a potential for serious side effects to an unborn child. Talk to your health care professional or pharmacist for more information. You should make sure that you get enough calcium and vitamin D while you are taking this medicine. Discuss the foods you eat and the  vitamins you take with your health care professional. Some people who take this medicine have severe bone, joint, and/or muscle pain. This medicine may also increase your risk for jaw problems or a broken thigh bone. Tell your doctor right away if you have severe pain in your jaw, bones, joints, or muscles. Tell your doctor if you have any pain that does not go away or that gets worse. Tell your dentist and dental surgeon that you are taking this medicine. You should not have major dental surgery while on this medicine. See your dentist to have a dental exam and fix any dental problems before starting this medicine. Take good care of your teeth while on this medicine. Make sure you see your dentist for regular follow-up appointments. What side effects may I notice from receiving this medicine? Side effects that you should report to your doctor or health care professional as soon as possible: -allergic reactions like skin rash, itching or hives, swelling of the face, lips, or tongue -anxiety, confusion, or depression -breathing problems -changes in vision -eye pain -feeling faint or lightheaded, falls -jaw pain, especially after dental work -mouth sores -muscle cramps, stiffness, or weakness -redness, blistering, peeling or loosening of the skin, including inside the mouth -trouble passing urine or change in the amount of urine Side effects that usually do not require medical attention (report to your doctor or health care professional if they continue or are bothersome): -bone, joint, or muscle pain -constipation -diarrhea -fever -hair loss -irritation at site where injected -loss of appetite -nausea, vomiting -stomach upset -trouble sleeping -trouble swallowing -weak or tired This list may not describe all possible side effects. Call your doctor for medical advice about side effects. You may report side effects to FDA at 1-800-FDA-1088. Where should I keep my medicine? This drug is  given in a hospital or clinic and will not be stored at home. NOTE: This sheet is a summary. It may not cover all possible information. If you have questions about this medicine, talk to your doctor, pharmacist, or health care provider.  2018 [employer] (2013-06-18 14:19:39)

## 2017-07-21 LAB — IGG, IGA, IGM
IgA: 186 mg/dL (ref 87–352)
IgG (Immunoglobin G), Serum: 1124 mg/dL (ref 700–1600)
IgM (Immunoglobulin M), Srm: 43 mg/dL (ref 26–217)

## 2017-07-22 LAB — PROTEIN ELECTROPHORESIS, SERUM
A/G Ratio: 1.2 (ref 0.7–1.7)
Albumin ELP: 3.4 g/dL (ref 2.9–4.4)
Alpha-1-Globulin: 0.2 g/dL (ref 0.0–0.4)
Alpha-2-Globulin: 0.7 g/dL (ref 0.4–1.0)
Beta Globulin: 0.9 g/dL (ref 0.7–1.3)
Gamma Globulin: 1.1 g/dL (ref 0.4–1.8)
Globulin, Total: 2.9 g/dL (ref 2.2–3.9)
TOTAL PROTEIN ELP: 6.3 g/dL (ref 6.0–8.5)

## 2017-08-02 NOTE — Progress Notes (Signed)
[place]  [phone] [phone]  Clinic Follow up Note   Patient Care Team: [name], MD as PCP - General (Family Medicine) [name], MD as Referring Physician (Specialist) [name], MD as Referring Physician (Specialist) [name], [name], MD as Referring Physician (Hematology and Oncology) 08/03/2017  SUMMARY OF ONCOLOGIC HISTORY:   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Sheryl [name])   10/07/2014 Imaging    Abdominal ultrasound showed mild splenomegaly, stable perisplenic complex fluid collection unchanged since 08/27/2010.      10/10/2014 Miscellaneous    Peripheral blood chemistry and leukocytosis with total white count 78K, comprised of large plasma cells and his normocytic anemia. There is a myeloid left shift with previous surgical radium blasts. Flow cytometry showed 64% plasma cells      10/10/2014 Bone Marrow Biopsy    Markedly hypercellular marrow (95%), Atypical plasma cells comprise 57% of the cellularity. There was diminished multilineage in hematopoiesis with adequate maturation. Breasts less than 1%), no overt dysplasia of the myeloid or erythroid lineages.       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      05/07/2015 - 05/12/2015 Hospital Ad[facility]    [facility], [facility]. ID workup was negative, EGD showed evidence of gastritis and duodenitis, no H. pylori or CMV.      05/20/2015 - 05/24/2015  Hospital Admission    [facility]      07/11/2015 Bone Marrow Biopsy    Post transplant 100 a bone marrow biopsy showed hypocellular marrow, 20%, no increase in plasma cells (2%) or other abnormalities.      08/22/2015 - 01/02/2016 Chemotherapy    MaintenanceCyborD (cytoxan 3021mm2 iv, bortezomib 1.5 mg/m, dexamethasone 40 mg, every 2 weeks, changed to Velcade maintenance after 4 months treatment      01/16/2016 - 04/19/2016 Chemotherapy    Maintenance Velcade 1.3 mg/m every 2 weeks      02/22/2016 Pathology Results    BONE MARROW: Diagnosis Bone Marrow, Aspirate,Biopsy, and Clot, right iliac - NORMOCELLULAR BONE MARROW FOR AGE WITH TRILINEAGE HEMATOPOIESIS. - PLASMACYTOSIS (PLASMA CELLS 12%). - SEE COMMENT. PERIPHERAL BLOOD: - OCCASIONAL CIRCULATING PLASMA CELLS.      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       04/18/2016 - 03/26/2017 Chemotherapy    Daratumumab per protocol  CyBorD every week, cytoxan was held after 04/24/2016 dye to cytopenia and infection  -discontinued due to Hep B flare      05/11/2016 - 05/16/2016 Hospital Admission    Healthcare-associated pneumonia      02/16/2017 - 02/21/2017 Hospital Admission    Admission diagnosis: Abnormal LFT's Additional comments: Assoc diagnoses: Hep B flare, transminitis, dehydration, fever, SIRS      04/06/2017 -  Chemotherapy    Carfilzomib 56 mg/m2 on days 1 and 15 (except 2010m2 on C1D1 and C1D2) with dexamethasone 20 mg on same  days and pomalidomide 2 mg on days 1-21 of 28 day cycle         CURRENT THERAPY:   carfilzomib 56 mg/m2 on days 1 and 15(except 22m/m2 on C1D1 and C1D2)with dexamethasone 20 mg on same days and pomalidomide 2 mg on days 1-21 of 28 day cycleon 04/13/17, dexa stopped on 07/06/2017 due to her CR   INTERVAL HISTORY: Ms. HPavonereturns for follow-up as scheduled evaluation and  next cycle Kyprolis.  She completed cycle 4-day 15 on 07/20/2017.  Will complete current cycle of Pomalyst tomorrow. She notes the delivery was late with her last cycle.  She had nausea with vomiting for 2 days following her last chemo, which she attributes to having treatment on an empty stomach.  Usually she does not experience this degree of nausea/vomiting.  She has Compazine, Phenergan, and Zofran at home which she takes as needed.  Has occasional gas pain and bloating.  Takes MiraLAX to maintain normal BM.  Appetite remains normal.  Energy is occasionally low, she continues to work.  Denies fever or chills, cough, chest pain, dyspnea.  She developed some soreness and dry mouth without obvious mouth sores.  Did not hinder p.o. intake.  REVIEW OF SYSTEMS:   Constitutional: Denies fevers, chills or abnormal weight loss (+) fatigue, at baseline Eyes: Denies blurriness of vision Ears, nose, mouth, throat, and face: Denies mucositis or sore throat (+) sore, dry mouth Respiratory: Denies cough, dyspnea or wheezes Cardiovascular: Denies palpitation, chest discomfort or lower extremity swelling Gastrointestinal:  Denies diarrhea, heartburn or change in bowel habits (+) nausea with vomiting x2 days after last chemo (+) gas (+) bloating (+) MiraLAX to maintain normal BM Skin: Denies abnormal skin rashes Lymphatics: Denies new lymphadenopathy or easy bruising Neurological:Denies numbness, tingling or new weaknesses Behavioral/Psych: Mood is stable, no new changes  All other systems were reviewed with the patient and are negative.  MEDICAL HISTORY:  Past Medical History:  Diagnosis Date  . Chills with fever    intermittently since d/c from hospital  . Dysuria-frequency syndrome    w/ pink urine  . GERD (gastroesophageal reflux disease)   . Hepatitis   . History of positive PPD    DX 2011--  CXR DONE NO EVIDENCE  . History of ureter stent   . Hydronephrosis, right   . Neuromuscular disorder (H[facility])     legs numb intermittently  . 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[place]   . Pneumonia   . Right ureteral stone   . Urosepsis 8/14   [facility]    SURGICAL HISTORY: Past Surgical History:  Procedure Laterality Date  . CYSTOSCOPY W/ URETERAL STENT PLACEMENT Right 09/25/2012   Procedure: CYSTOSCOPY WITH RETROGRADE PYELOGRAM/URETERAL STENT PLACEMENT;  Surgeon: TAlexis Frock MD;  Location: [facility];  Service: Urology;  Laterality: Right;  . CYSTOSCOPY WITH RETROGRADE PYELOGRAM, URETEROSCOPY AND STENT PLACEMENT Right 10/15/2012   Procedure: CYSTOSCOPY WITH RETROGRADE PYELOGRAM, URETEROSCOPY AND REMOVAL STENT WITH  STENT PLACEMENT;  Surgeon: TAlexis Frock MD;  [facility]  Service: Urology;  Laterality: Right;  . ESOPHAGOGASTRODUODENOSCOPY (EGD) WITH PROPOFOL N/A 11/16/2014   Procedure: ESOPHAGOGASTRODUODENOSCOPY (EGD) WITH PROPOFOL;  Surgeon: DMilus Banister MD;  Location: [facility];  Service: Endoscopy;  Laterality: N/A;  . HOLMIUM LASER APPLICATION Right 92/77/4128  Procedure: HOLMIUM LASER APPLICATION;  Surgeon: TAlexis Frock MD;  [facility]  Service: Urology;  Laterality: Right;  . LIVER BIOPSY    . OTHER SURGICAL HISTORY Right  removal of ovarian cyst  . removal of uterine cyst     years ago  . RIGHT VATS W/ DRAINAGE PEURAL EFFUSION AND BX'S  10-30-2008    I have reviewed the social history and family history with the patient and they are unchanged from previous note.  ALLERGIES:  has No Known Allergies.  MEDICATIONS:  Current Outpatient Medications  Medication Sig Dispense Refill  . acyclovir (ZOVIRAX) 800 MG tablet Take 1 tablet (800 mg total) by mouth 2 (two) times daily. 60 tablet 5  . feeding supplement, ENSURE ENLIVE, (ENSURE ENLIVE) LIQD Take 237 mLs by mouth 2 (two) times daily between meals. 237 mL 12  . omeprazole (PRILOSEC) 20 MG capsule Take 1 capsule (20 mg total) by mouth daily. 30 capsule 5  . ondansetron (ZOFRAN)  8 MG tablet Take 1 tablet (8 mg total) by mouth every 8 (eight) hours as needed for nausea or vomiting. 30 tablet 1  . polyethylene glycol (MIRALAX / GLYCOLAX) packet Take 17 g by mouth daily as needed (constipation).     . pomalidomide (POMALYST) 2 MG capsule Take 1 capsule (2 mg total) by mouth daily. Take with water on days 1-21. Repeat every 28 days. 21 capsule 0  . prochlorperazine (COMPAZINE) 10 MG tablet Take 1 tablet (10 mg total) by mouth every 6 (six) hours as needed for nausea or vomiting. 30 tablet 0  . promethazine (PHENERGAN) 25 MG tablet Take 1 tablet (25 mg total) by mouth every 6 (six) hours as needed for nausea or vomiting. 30 tablet 0  . ursodiol (ACTIGALL) 300 MG capsule Take 1 capsule (300 mg total) by mouth 2 (two) times daily. 60 capsule 0  . VIREAD 300 MG tablet Take 1 tablet (300 mg total) by mouth daily. 30 tablet 11  . magic mouthwash SOLN Take 5 mLs by mouth 4 (four) times daily as needed for mouth pain. (Patient not taking: Reported on 08/03/2017) 240 mL 3  . potassium chloride SA (K-DUR,KLOR-CON) 20 MEQ tablet Take 1 tablet (20 mEq total) by mouth daily. 30 tablet 0   No current facility-administered medications for this visit.    Facility-Administered Medications Ordered in Other Visits  Medication Dose Route Frequency Provider Last Rate Last Dose  . sodium chloride 0.9 % injection 10 mL  10 mL Intravenous PRN Truitt Merle, MD   10 mL at 12/11/15 1532  . sodium chloride 0.9 % injection 10 mL  10 mL Intravenous PRN Truitt Merle, MD   10 mL at 06/11/16 1505  . sodium chloride flush (NS) 0.9 % injection 10 mL  10 mL Intravenous PRN Truitt Merle, MD   10 mL at 10/09/15 1717  . sodium chloride flush (NS) 0.9 % injection 10 mL  10 mL Intracatheter PRN Truitt Merle, MD   10 mL at 08/03/17 1237    PHYSICAL EXAMINATION: ECOG PERFORMANCE STATUS: 1 - Symptomatic but completely ambulatory  Vitals:   08/03/17 0920  BP: (!) 119/59  Pulse: 76  Resp: 16  Temp: 98.1 F (36.7 C)  SpO2:  100%   Filed Weights   08/03/17 0920  Weight: 134 lb 1.6 oz (60.8 kg)    GENERAL:alert, no distress and comfortable SKIN: no rashes or significant lesions EYES: normal, Conjunctiva are pink and non-injected, sclera clear OROPHARYNX: no thrush or ulcers LYMPH:  no palpable cervical or supraclavicular lymphadenopathy  LUNGS: clear to auscultation with normal breathing effort HEART: regular rate & rhythm and no murmurs and no lower extremity edema ABDOMEN:abdomen soft, non-tender  and normal bowel sounds.  No hepatomegaly Musculoskeletal:no cyanosis of digits and no clubbing  NEURO: alert & oriented x 3 with fluent speech, no focal motor/sensory deficits PAC without erythema  LABORATORY DATA:  I have reviewed the data as listed CBC Latest Ref Rng & Units 08/03/2017 07/20/2017 07/10/2017  WBC 3.9 - 10.3 K/uL 3.2(L) 4.1 3.3(L)  Hemoglobin 11.6 - 15.9 g/dL 9.4(L) 9.4(L) 10.3(L)  Hematocrit 34.8 - 46.6 % 31.3(L) 29.6(L) 32.9(L)  Platelets 145 - 400 K/uL 150 303 130(L)     CMP Latest Ref Rng & Units 08/03/2017 07/20/2017 07/10/2017  Glucose 70 - 99 mg/dL 81 98 79  BUN 6 - 20 mg/dL '17 16 11  ' Creatinine 0.44 - 1.00 mg/dL 0.71 0.73 0.74  Sodium 135 - 145 mmol/L 141 139 144  Potassium 3.5 - 5.1 mmol/L 3.8 3.9 4.1  Chloride 98 - 111 mmol/L 107 106 110(H)  CO2 22 - 32 mmol/L '29 28 24  ' Calcium 8.9 - 10.3 mg/dL 9.6 9.2 8.8  Total Protein 6.5 - 8.1 g/dL 6.7 6.6 6.8  Total Bilirubin 0.3 - 1.2 mg/dL 0.3 <0.2(L) 0.3  Alkaline Phos 38 - 126 U/L 83 124 114  AST 15 - 41 U/L 22 29 34  ALT 0 - 44 U/L '9 8 12   ' SPEP M-protein  09/15/2014: 4.2 10/08/14: 4.6 12/08/2014: 0.2 02/15/2015: not sufficient sample for test   09/11/2015: not observed 10/09/2015: not det   11/06/2015: not det  12/25/2015: not det  02/13/2016: 0.3 03/12/2016: 0.8 04/09/2016: 0.6 05/08/16: 0.1 06/04/16: 0.1 07/02/2016: 0.2 07/09/2016: 0.2 (dara specific IFE negative) 08/05/16: Not observed  09/01/16: Not observed 09/29/16: 0.1(dara specific IFE  negative) 10/27/16: Not observed 11/24/16 : Not observed 12/23/16: 0.1 (Dara specific IFE showed IgG monoclonal protein with lambda light chain) 01/21/2017: 0.1 (Dara specific IFE showed IgG monoclonal protein with lambda light chain) 03/26/17: 0.2 04/27/17: 0.1 05/25/17: Not observed 06/23/17: Not observed 07/20/17: Not observed    IgG mg/dl  11/03/2014: 3150  12/08/2014:  759 02/14/2014: 860 09/11/2015: 1347 10/09/2015: 1457 11/06/2015: 1504 12/25/2015: 1400 02/13/2016: 1543 03/12/2016: 1860 04/09/2016: 1620 05/08/16: 749 06/04/16: 751 5/30/20187: 796 07/09/2016: 701 08/05/16: 678 09/01/16: 650 09/29/16: 727 10/27/16: 634 11/24/16: 685  12/23/16: 714 01/21/2017: 709 03/26/17: 874 04/27/17: 1067 05/25/17: 1161 06/23/17: 1211 07/20/17: 1124    Kappa/lambda light chains levels and ration  11/03/14: 0.75, 152, 0.00 12/22/2014: 1.10, 2.60, 0.42 02/15/2015: 9.59,14.35, 0.67 09/11/2015: 2.44, 2.72, 0.90 10/09/2015: 3.09, 3.49, 0.89 11/06/2015: 2.30, 2.62, 0.88 12/25/2015: 2.51, 3.0, 0.84 02/13/2016: 2.64, 15.3, 0.17 03/12/2016: 2.27, 45.5, 0.05 04/09/2016: 3.33, 45.5, 0.07 05/08/2016: 0.49, 1.21, 0.40 06/04/2016: 0.83, 1.11, 0.75 07/02/16: 0.51, 1.43, 0.36 07/23/16: 0.68, 1.02, 0.67 08/05/16: 0.62, 1.09, 0.57 09/01/16: 0.80, 1.21, 0.66 09/29/16: 0.44, 1.07, 0.41 10/27/16: 0.63, 1.44, 0.44 11/24/16: 0.83, 2.13, 0.39 12/23/16: 0.69, 2.31, 0.30 01/21/2017: 0.77, 3.08, 0.25   24 h urine UPEP/IFE and light chain: 11/03/2014: IFE showed a monoclonal IgG heavy chain with associated lambda light chain. M protein was Undetectable    PATHOLOGY   PATHOLOGY REPORT  Bone Marrow (BM) and Peripheral Blood (PB) FINAL PATHOLOGIC DIAGNOSIS  BONE MARROW: 07/11/2015 Hypocellular marrow (20%) with no increase in plasma cells (2%). See comment. PERIPHERAL BLOOD: Mild anemia. No circulating plasma cells identified. See comment and CBC data.  BONE MARROW: 02/22/2016 Diagnosis Bone Marrow, Aspirate,Biopsy, and  Clot, right iliac - NORMOCELLULAR BONE MARROW FOR AGE WITH TRILINEAGE HEMATOPOIESIS. - PLASMACYTOSIS (PLASMA CELLS 12%). - SEE COMMENT. PERIPHERAL BLOOD: - OCCASIONAL CIRCULATING PLASMA CELLS.  PROCEDURES  Bone Marrow Biopsy, 01/21/17 Bone Marrow, Aspirate, Biopsy, and Clot - NORMOCELLULAR BONE MARROW FOR AGE WITH TRILINEAGE HEMATOPOIESIS AND 2% PLASMA CELLS. PERIPHERAL BLOOD: - NORMOCYTIC-HYPOCHROMIC ANEMIA.     RADIOGRAPHIC STUDIES: I have personally reviewed the radiological images as listed and agreed with the findings in the report. No results found.   ASSESSMENT & PLAN: 48 y.o. [place] woman, presented with anemia and leokocytosis    1.  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 transaminitis and hyperbilirubinemia, secondary to hepatitis B flare 3. 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 GERD, h/o GI bleeding and nausea   Sheryl [name] appears stable. She completed cycle 4 carfilzomib 56 mg/m2 on days 1 and day 15. She tolerated last cycle well overall, with 2 days of nausea/vomiting she attributes to not eating prior to treatment. We reviewed oral anti-emetic dosing. She continues pomalidomide 2 mg daily, on day 20 of current cycle which she will complete tomorrow then off for 7 days. Will refill for her today. Will also refill magic mouthwash for mouth soreness. No obvious mucositis. She has f/u with ID on 7/2. Labs reviewed, adequate for treatment. M spike remains undetectable. She is off dexamethasone. Will continue maintenance therapy with carfilzomib on days 1 and 15, pomalidominde for 3 weeks on/1 week off. She receives monthly zometa, due today. F/u in 4 weeks.   PLAN: -Labs reviewed, proceed with next cycle karfilzomib, continue q2 weeks -Zometa due today, 7/1; continue monthly  -Complete pomalidomide on 7/2, then off for 7 days; will refill pending negative urine pregnancy test -Refill magic  mouthwash -ID f/u 7/2 -Return in 2 weeks for lab, flush, kyprolis -F/u in 4 weeks   All questions were answered. The patient knows to call the clinic with any problems, questions or concerns. No barriers to learning was detected.     Alla Feeling, NP 08/03/17

## 2017-08-03 ENCOUNTER — Inpatient Hospital Stay (HOSPITAL_BASED_OUTPATIENT_CLINIC_OR_DEPARTMENT_OTHER): Payer: Medicaid Other | Admitting: Nurse Practitioner

## 2017-08-03 ENCOUNTER — Inpatient Hospital Stay: Payer: Medicaid Other

## 2017-08-03 ENCOUNTER — Encounter: Payer: Self-pay | Admitting: Nurse Practitioner

## 2017-08-03 ENCOUNTER — Telehealth: Payer: Self-pay

## 2017-08-03 ENCOUNTER — Inpatient Hospital Stay: Payer: Medicaid Other | Attending: Hematology

## 2017-08-03 ENCOUNTER — Encounter: Payer: Self-pay | Admitting: *Deleted

## 2017-08-03 VITALS — BP 119/59 | HR 76 | Temp 98.1°F | Resp 16 | Ht 63.0 in | Wt 134.1 lb

## 2017-08-03 DIAGNOSIS — R5383 Other fatigue: Secondary | ICD-10-CM | POA: Insufficient documentation

## 2017-08-03 DIAGNOSIS — R531 Weakness: Secondary | ICD-10-CM | POA: Insufficient documentation

## 2017-08-03 DIAGNOSIS — K219 Gastro-esophageal reflux disease without esophagitis: Secondary | ICD-10-CM | POA: Diagnosis not present

## 2017-08-03 DIAGNOSIS — C9012 Plasma cell leukemia in relapse: Secondary | ICD-10-CM

## 2017-08-03 DIAGNOSIS — Z95828 Presence of other vascular implants and grafts: Secondary | ICD-10-CM

## 2017-08-03 DIAGNOSIS — R112 Nausea with vomiting, unspecified: Secondary | ICD-10-CM | POA: Diagnosis not present

## 2017-08-03 DIAGNOSIS — Z5112 Encounter for antineoplastic immunotherapy: Secondary | ICD-10-CM | POA: Insufficient documentation

## 2017-08-03 DIAGNOSIS — E099 Drug or chemical induced diabetes mellitus without complications: Secondary | ICD-10-CM | POA: Diagnosis not present

## 2017-08-03 DIAGNOSIS — Z9221 Personal history of antineoplastic chemotherapy: Secondary | ICD-10-CM | POA: Insufficient documentation

## 2017-08-03 DIAGNOSIS — Z9484 Stem cells transplant status: Secondary | ICD-10-CM | POA: Insufficient documentation

## 2017-08-03 DIAGNOSIS — C9010 Plasma cell leukemia not having achieved remission: Secondary | ICD-10-CM

## 2017-08-03 DIAGNOSIS — K1379 Other lesions of oral mucosa: Secondary | ICD-10-CM

## 2017-08-03 LAB — CMP (CANCER CENTER ONLY)
ALBUMIN: 3.4 g/dL — AB (ref 3.5–5.0)
ALK PHOS: 83 U/L (ref 38–126)
ALT: 9 U/L (ref 0–44)
AST: 22 U/L (ref 15–41)
Anion gap: 5 (ref 5–15)
BILIRUBIN TOTAL: 0.3 mg/dL (ref 0.3–1.2)
BUN: 17 mg/dL (ref 6–20)
CALCIUM: 9.6 mg/dL (ref 8.9–10.3)
CO2: 29 mmol/L (ref 22–32)
CREATININE: 0.71 mg/dL (ref 0.44–1.00)
Chloride: 107 mmol/L (ref 98–111)
GFR, Est AFR Am: >60 mL/min (ref >60)
GFR, Estimated: >60 mL/min (ref >60)
GLUCOSE: 81 mg/dL (ref 70–99)
Potassium: 3.8 mmol/L (ref 3.5–5.1)
SODIUM: 141 mmol/L (ref 135–145)
TOTAL PROTEIN: 6.7 g/dL (ref 6.5–8.1)

## 2017-08-03 LAB — CBC WITH DIFFERENTIAL (CANCER CENTER ONLY)
BASOS PCT: 1 %
Basophils Absolute: 0 10*3/uL (ref 0.0–0.1)
EOS PCT: 6 %
Eosinophils Absolute: 0.2 10*3/uL (ref 0.0–0.5)
HEMATOCRIT: 31.3 % — AB (ref 34.8–46.6)
Hemoglobin: 9.4 g/dL — ABNORMAL LOW (ref 11.6–15.9)
Lymphocytes Relative: 27 %
Lymphs Abs: 0.9 10*3/uL (ref 0.9–3.3)
MCH: 26.3 pg (ref 25.1–34.0)
MCHC: 30 g/dL — ABNORMAL LOW (ref 31.5–36.0)
MCV: 87.7 fL (ref 79.5–101.0)
MONO ABS: 0.6 10*3/uL (ref 0.1–0.9)
MONOS PCT: 20 %
NEUTROS ABS: 1.5 10*3/uL (ref 1.5–6.5)
Neutrophils Relative %: 46 %
PLATELETS: 150 10*3/uL (ref 145–400)
RBC: 3.57 MIL/uL — ABNORMAL LOW (ref 3.70–5.45)
RDW: 15.5 % — AB (ref 11.2–14.5)
WBC Count: 3.2 10*3/uL — ABNORMAL LOW (ref 3.9–10.3)

## 2017-08-03 MED ORDER — POMALIDOMIDE 2 MG PO CAPS: 2.0000 mg | ORAL_CAPSULE | Freq: Every day | ORAL | 0 refills | Status: DC

## 2017-08-03 MED ORDER — PROCHLORPERAZINE MALEATE 10 MG PO TABS
10.0000 mg | ORAL_TABLET | Freq: Once | ORAL | Status: AC
  Administered 2017-08-03: 10 mg via ORAL

## 2017-08-03 MED ORDER — PROCHLORPERAZINE MALEATE 10 MG PO TABS
ORAL_TABLET | ORAL | Status: AC
  Filled 2017-08-03: qty 1

## 2017-08-03 MED ORDER — SODIUM CHLORIDE 0.9 % IV SOLN
Freq: Once | INTRAVENOUS | Status: AC
  Administered 2017-08-03: 11:00:00 via INTRAVENOUS

## 2017-08-03 MED ORDER — DEXTROSE 5 % IV SOLN
56.0000 mg/m2 | Freq: Once | INTRAVENOUS | Status: AC
  Administered 2017-08-03: 90 mg via INTRAVENOUS
  Filled 2017-08-03: qty 30

## 2017-08-03 MED ORDER — ZOLEDRONIC ACID 4 MG/100ML IV SOLN
4.0000 mg | Freq: Once | INTRAVENOUS | Status: AC
  Administered 2017-08-03: 4 mg via INTRAVENOUS
  Filled 2017-08-03: qty 100

## 2017-08-03 MED ORDER — SODIUM CHLORIDE 0.9% FLUSH
10.0000 mL | INTRAVENOUS | Status: DC | PRN
  Administered 2017-08-03: 10 mL
  Filled 2017-08-03: qty 10

## 2017-08-03 MED ORDER — SODIUM CHLORIDE 0.9 % IJ SOLN
10.0000 mL | INTRAMUSCULAR | Status: DC | PRN
  Administered 2017-08-03: 10 mL via INTRAVENOUS
  Filled 2017-08-03: qty 10

## 2017-08-03 MED ORDER — HEPARIN SOD (PORK) LOCK FLUSH 100 UNIT/ML IV SOLN
500.0000 [IU] | Freq: Once | INTRAVENOUS | Status: AC | PRN
  Administered 2017-08-03: 500 [IU]
  Filled 2017-08-03: qty 5

## 2017-08-03 NOTE — Telephone Encounter (Signed)
Printed avs and calender of upcoming appointment per [employer]

## 2017-08-03 NOTE — Patient Instructions (Signed)
[place] Discharge Instructions for Patients Receiving Chemotherapy  Today you received the following chemotherapy agents kyrpolis and Zometa  To help prevent nausea and vomiting after your treatment, we encourage you to take your nausea medication as directed   If you develop nausea and vomiting that is not controlled by your nausea medication, call the clinic.   BELOW ARE SYMPTOMS THAT SHOULD BE REPORTED IMMEDIATELY:  *FEVER GREATER THAN 100.5 F  *CHILLS WITH OR WITHOUT FEVER  NAUSEA AND VOMITING THAT IS NOT CONTROLLED WITH YOUR NAUSEA MEDICATION  *UNUSUAL SHORTNESS OF BREATH  *UNUSUAL BRUISING OR BLEEDING  TENDERNESS IN MOUTH AND THROAT WITH OR WITHOUT PRESENCE OF ULCERS  *URINARY PROBLEMS  *BOWEL PROBLEMS  UNUSUAL RASH Items with * indicate a potential emergency and should be followed up as soon as possible.  Feel free to call the clinic should you have any questions or concerns. The clinic phone number is [phone].  Please show the [place] at check-in to the Emergency Department and triage nurse.

## 2017-08-04 ENCOUNTER — Inpatient Hospital Stay: Payer: Medicaid Other

## 2017-08-04 ENCOUNTER — Ambulatory Visit: Payer: Medicaid Other | Admitting: Internal Medicine

## 2017-08-04 ENCOUNTER — Telehealth: Payer: Self-pay | Admitting: Emergency Medicine

## 2017-08-04 ENCOUNTER — Inpatient Hospital Stay (HOSPITAL_BASED_OUTPATIENT_CLINIC_OR_DEPARTMENT_OTHER): Payer: Medicaid Other | Admitting: Medical

## 2017-08-04 VITALS — BP 95/63 | HR 96 | Temp 98.2°F | Resp 20 | Wt 131.4 lb

## 2017-08-04 DIAGNOSIS — Z5112 Encounter for antineoplastic immunotherapy: Secondary | ICD-10-CM | POA: Diagnosis not present

## 2017-08-04 DIAGNOSIS — R112 Nausea with vomiting, unspecified: Secondary | ICD-10-CM

## 2017-08-04 DIAGNOSIS — C9012 Plasma cell leukemia in relapse: Secondary | ICD-10-CM

## 2017-08-04 DIAGNOSIS — K219 Gastro-esophageal reflux disease without esophagitis: Secondary | ICD-10-CM | POA: Diagnosis not present

## 2017-08-04 DIAGNOSIS — C9010 Plasma cell leukemia not having achieved remission: Secondary | ICD-10-CM

## 2017-08-04 DIAGNOSIS — Z95828 Presence of other vascular implants and grafts: Secondary | ICD-10-CM

## 2017-08-04 LAB — PREGNANCY, URINE: PREG TEST UR: NEGATIVE

## 2017-08-04 MED ORDER — HEPARIN SOD (PORK) LOCK FLUSH 100 UNIT/ML IV SOLN
500.0000 [IU] | Freq: Once | INTRAVENOUS | Status: AC | PRN
  Administered 2017-08-04: 500 [IU] via INTRAVENOUS
  Filled 2017-08-04: qty 5

## 2017-08-04 MED ORDER — ONDANSETRON HCL 4 MG/2ML IJ SOLN
8.0000 mg | Freq: Once | INTRAMUSCULAR | Status: AC
  Administered 2017-08-04: 8 mg via INTRAVENOUS

## 2017-08-04 MED ORDER — SODIUM CHLORIDE 0.9 % IJ SOLN
10.0000 mL | INTRAMUSCULAR | Status: AC | PRN
  Administered 2017-08-04: 10 mL via INTRAVENOUS
  Filled 2017-08-04: qty 10

## 2017-08-04 MED ORDER — SODIUM CHLORIDE 0.9 % IV SOLN
Freq: Once | INTRAVENOUS | Status: AC
  Administered 2017-08-04: 15:00:00 via INTRAVENOUS

## 2017-08-04 MED ORDER — ONDANSETRON HCL 4 MG/2ML IJ SOLN
INTRAMUSCULAR | Status: AC
Start: 2017-08-04 — End: ?
  Filled 2017-08-04: qty 4

## 2017-08-04 MED ORDER — DEXAMETHASONE SODIUM PHOSPHATE 10 MG/ML IJ SOLN
10.0000 mg | Freq: Once | INTRAMUSCULAR | Status: AC
  Administered 2017-08-04: 10 mg via INTRAVENOUS

## 2017-08-04 MED ORDER — GI COCKTAIL ~~LOC~~
30.0000 mL | Freq: Once | ORAL | Status: AC
  Administered 2017-08-04: 30 mL via ORAL
  Filled 2017-08-04: qty 30

## 2017-08-04 MED ORDER — SODIUM CHLORIDE 0.9 % IV SOLN: 10.0000 mg | Freq: Once | INTRAVENOUS | Status: DC

## 2017-08-04 MED ORDER — SODIUM CHLORIDE 0.9 % IV SOLN: Freq: Once | INTRAVENOUS | Status: DC

## 2017-08-04 MED ORDER — DEXAMETHASONE SODIUM PHOSPHATE 10 MG/ML IJ SOLN
INTRAMUSCULAR | Status: AC
  Filled 2017-08-04: qty 1

## 2017-08-04 NOTE — Progress Notes (Signed)
Pt reports improvement in nausea and stomach ache.  Tolerated ginger ale and broth well.

## 2017-08-04 NOTE — Telephone Encounter (Signed)
Pt filled out walk in form for [facility] this morning for fever, upset stomach, and vomiting.  Pt and daughter left after lab appt before being seen by [facility].  Called them, per daughter pt felt too tired and ill to sit in the lobby.  Encouraged pt to come back this afternoon around 1300 after she's had some time to rest.  Also encouraged her to visit ER if her condition worsens or to contact us/come in sooner as needed.  Verbalized understanding.

## 2017-08-04 NOTE — Progress Notes (Signed)
[facility] Progress Note   Sheryl [name] [id number] 05-02-1970 47 y.o.  Sheryl [name] is managed by Dr. Truitt Merle  Actively treated with chemotherapy/immunotherapy: yes  Current Therapy: Kyprolis, pomalidomide, and Zometa  Last Treated:   08/03/2017 (cycle 5, day 1)  Assessment: Plan:    Nausea and vomiting, intractability of vomiting not specified, unspecified vomiting type - Plan: 0.9 %  sodium chloride infusion, ondansetron (ZOFRAN) injection 8 mg, dexamethasone (DECADRON) injection 10 mg, DISCONTINUED: ondansetron (ZOFRAN) 10 mg in sodium chloride 0.9 % 50 mL IVPB, DISCONTINUED: dexamethasone (DECADRON) 10 mg in sodium chloride 0.9 % 50 mL IVPB  Gastroesophageal reflux disease, esophagitis presence not specified - Plan: gi cocktail (Maalox,Lidocaine,Donnatal), ondansetron (ZOFRAN) injection 8 mg, dexamethasone (DECADRON) injection 10 mg  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  Her nausea has improved.  She was able to drink chicken broth and ginger ale.  She was told that she could hold her pomalidomide for several days until her nausea and vomiting improved.  GERD: Patient was given a GI cocktail with resolution of her epigastric discomfort.  Plasma cell leukemia: Patient is status post cycle 5, day 1 of Kyprolis.  She continues on pomalidomide but has been instructed to hold this for a several days until her nausea improves.  The patient is scheduled to follow-up on 08/17/2017.  Please see After Visit Summary for patient specific instructions.  Future Appointments  Date Time Provider [place]  08/17/2017  8:30 AM [facility] ONLY [facility] None  08/17/2017  8:45 AM [facility] [place] FLUSH [facility] None  08/17/2017  9:30 AM Alla Feeling, NP [facility] None  08/17/2017 11:30 AM [facility] INFUSION [facility] None  08/31/2017 10:45 AM  [facility] LAB 5 [facility] None  08/31/2017 11:00 AM [facility] [place] FLUSH [facility] None  08/31/2017 11:30 AM Alla Feeling, NP [facility] None  08/31/2017 12:30 PM [facility] INFUSION [facility] None  09/14/2017  9:30 AM [facility] LAB 3 [facility] None  09/14/2017  9:45 AM [facility] [place] FLUSH [facility] None  09/14/2017 10:15 AM Truitt Merle, MD [facility] None  09/14/2017 11:15 AM [facility] INFUSION [facility] None    No orders of the defined types were placed in this encounter.      Subjective:   Patient ID:  Sheryl [name] is a 47 y.o. (DOB 1970/04/21) female.  Chief Complaint:  Chief Complaint  Patient presents with  . Emesis    HPI Sheryl [name]  is a 47 year old [place] female with a history of a plasma cell leukemia.  She is managed by Dr. Truitt Merle.  She is currently treated with pomalidomide 2 mg daily for 3 weeks then 7 days off, carfilzomib for day 1 and day 15 every 3 weeks with 1 week off.  She also receives monthly Zometa.  She was treated with krypolis and Zometa yesterday.  She presented as a walk-in patient today stating that she was having nausea and vomiting yesterday afternoon.  She has not been able to keep anything down.  Her last meal was Sunday.  She reports epigastric discomfort after vomiting multiple times.  She reports a burning sensation in her epigastric area that extends superiorly along her esophagus.  She reports fatigue, a subjective fever last evening and chills today.  She has had no elevated temperature. She has a history of constipation but reports that she had a bowel movement  yesterday.  She has had no cough or other symptoms.  Her labs from yesterday showed a WBC of 3.2, hemoglobin 9.4, hematocrit 31.3, and platelet count of 150.  Her chemistry panel was normal except for a mildly elevated albumin at 3.4.  A serum protein electrophoresis is pending.     Medications: I have reviewed the patient's current medications.  Allergies: No Known  Allergies  Past Medical History:  Diagnosis Date  . Chills with fever    intermittently since d/c from hospital  . Dysuria-frequency syndrome    w/ pink urine  . GERD (gastroesophageal reflux disease)   . Hepatitis   . History of positive PPD    DX 2011--  CXR DONE NO EVIDENCE  . History of ureter stent   . Hydronephrosis, right   . Neuromuscular disorder (HCC)    legs numb intermittently  . 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[place])   . Pneumonia   . Right ureteral stone   . Urosepsis 8/14   admitted to [facility]    Past Surgical History:  Procedure Laterality Date  . CYSTOSCOPY W/ URETERAL STENT PLACEMENT Right 09/25/2012   Procedure: CYSTOSCOPY WITH RETROGRADE PYELOGRAM/URETERAL STENT PLACEMENT;  Surgeon: Alexis Frock, MD;  Location: [facility];  Service: Urology;  Laterality: Right;  . CYSTOSCOPY WITH RETROGRADE PYELOGRAM, URETEROSCOPY AND STENT PLACEMENT Right 10/15/2012   Procedure: CYSTOSCOPY WITH RETROGRADE PYELOGRAM, URETEROSCOPY AND REMOVAL STENT WITH  STENT PLACEMENT;  Surgeon: Alexis Frock, MD;  [facility]  Service: Urology;  Laterality: Right;  . ESOPHAGOGASTRODUODENOSCOPY (EGD) WITH PROPOFOL N/A 11/16/2014   Procedure: ESOPHAGOGASTRODUODENOSCOPY (EGD) WITH PROPOFOL;  Surgeon: Milus Banister, MD;  Location: [facility];  Service: Endoscopy;  Laterality: N/A;  . HOLMIUM LASER APPLICATION Right 02/09/2692   Procedure: HOLMIUM LASER APPLICATION;  Surgeon: Alexis Frock, MD;  Location: [facility];  Service: Urology;  Laterality: Right;  . LIVER BIOPSY    . OTHER SURGICAL HISTORY Right    removal of ovarian cyst  . removal of uterine cyst     years ago  . RIGHT VATS W/ DRAINAGE PEURAL EFFUSION AND BX'S  10-30-2008    Family History  Problem Relation Age of Onset  . Stomach cancer Mother   . Lung disease Father   . Asthma Father     Social History   Socioeconomic History  . Marital status: Single    Spouse name: Not on file  . Number  of children: 3  . Years of education: Not on file  . Highest education level: Not on file  Occupational History  . Not on file  Social Needs  . Financial resource strain: Not on file  . Food insecurity:    Worry: Not on file    Inability: Not on file  . Transportation needs:    Medical: Not on file    Non-medical: Not on file  Tobacco Use  . Smoking status: Never Smoker  . Smokeless tobacco: Never Used  Substance and Sexual Activity  . Alcohol use: No    Alcohol/week: 0.0 oz  . Drug use: No  . Sexual activity: Not Currently    Birth control/protection: Abstinence  Lifestyle  . Physical activity:    Days per week: Not on file    Minutes per session: Not on file  . Stress: Not on file  Relationships  . Social connections:    Talks on phone: Not on file    Gets together: Not on file    Attends religious service:  Not on file    Active member of club or organization: Not on file    Attends meetings of clubs or organizations: Not on file    Relationship status: Not on file  . Intimate partner violence:    Fear of current or ex partner: Not on file    Emotionally abused: Not on file    Physically abused: Not on file    Forced sexual activity: Not on file  Other Topics Concern  . Not on file  Social History Narrative  . Not on file    Past Medical History, Surgical history, Social history, and Family history were reviewed and updated as appropriate.   Please see review of systems for further details on the patient's review from today.   Review of Systems:  Review of Systems  Constitutional: Positive for appetite change and fatigue. Negative for chills, diaphoresis and fever.  HENT: Negative for trouble swallowing.   Respiratory: Negative for cough and shortness of breath.   Gastrointestinal: Positive for nausea and vomiting. Negative for abdominal distention, abdominal pain, constipation and diarrhea.  Genitourinary: Negative for decreased urine volume and difficulty  urinating.    Objective:   Physical Exam:  BP 95/63 (BP Location: Left Arm, Patient Position: Sitting, Cuff Size: Normal)   Pulse 96   Temp 98.2 F (36.8 C) (Oral)   Resp 20   Wt 131 lb 6.4 oz (59.6 kg)   SpO2 100%   BMI 23.28 kg/m  ECOG: 1  Physical Exam  Constitutional: No distress.  HENT:  Head: Normocephalic and atraumatic.  Mouth/Throat: Oropharynx is clear and moist.  Cardiovascular: Normal rate, regular rhythm and normal heart sounds. Exam reveals no gallop and no friction rub.  No murmur heard. Pulmonary/Chest: Effort normal and breath sounds normal. No stridor. No respiratory distress. She has no wheezes. She has no rales.  Abdominal: Soft. Bowel sounds are normal. She exhibits no distension and no mass. There is no tenderness. There is no rebound and no guarding.  Neurological: She is alert. Coordination normal.  Skin: Skin is warm and dry. She is not diaphoretic.    Lab Review:     Component Value Date/Time   NA 141 08/03/2017 0829   NA 140 02/02/2017 1317   K 3.8 08/03/2017 0829   K 4.0 02/02/2017 1317   CL 107 08/03/2017 0829   CO2 29 08/03/2017 0829   CO2 25 02/02/2017 1317   GLUCOSE 81 08/03/2017 0829   GLUCOSE 121 02/02/2017 1317   BUN 17 08/03/2017 0829   BUN 13.8 02/02/2017 1317   CREATININE 0.71 08/03/2017 0829   CREATININE 0.72 03/03/2017 1533   CREATININE 0.8 02/02/2017 1317   CALCIUM 9.6 08/03/2017 0829   CALCIUM 9.3 02/02/2017 1317   PROT 6.7 08/03/2017 0829   PROT 6.9 02/02/2017 1317   ALBUMIN 3.4 (L) 08/03/2017 0829   ALBUMIN 3.5 02/02/2017 1317   AST 22 08/03/2017 0829   AST 47 (H) 02/02/2017 1317   ALT 9 08/03/2017 0829   ALT 23 02/02/2017 1317   ALKPHOS 83 08/03/2017 0829   ALKPHOS 98 02/02/2017 1317   BILITOT 0.3 08/03/2017 0829   BILITOT <0.22 02/02/2017 1317   GFRNONAA >60 08/03/2017 0829   GFRAA >60 08/03/2017 0829       Component Value Date/Time   WBC 3.2 (L) 08/03/2017 0829   WBC 3.3 (L) 07/10/2017 0800   RBC 3.57  (L) 08/03/2017 0829   HGB 9.4 (L) 08/03/2017 0829   HGB 10.4 (L) 02/02/2017 1317  HCT 31.3 (L) 08/03/2017 0829   HCT 33.9 (L) 02/02/2017 1317   PLT 150 08/03/2017 0829   PLT 172 02/02/2017 1317   MCV 87.7 08/03/2017 0829   MCV 87.6 02/02/2017 1317   MCH 26.3 08/03/2017 0829   MCHC 30.0 (L) 08/03/2017 0829   RDW 15.5 (H) 08/03/2017 0829   RDW 14.1 02/02/2017 1317   LYMPHSABS 0.9 08/03/2017 0829   LYMPHSABS 0.7 (L) 02/02/2017 1317   MONOABS 0.6 08/03/2017 0829   MONOABS 0.1 02/02/2017 1317   EOSABS 0.2 08/03/2017 0829   EOSABS 0.0 02/02/2017 1317   BASOSABS 0.0 08/03/2017 0829   BASOSABS 0.0 02/02/2017 1317   -------------------------------  Imaging from last 24 hours (if applicable):  Radiology interpretation: No results found.      This case was discussed with Dr. Burr Medico. She expressed agreement with my management of this patient.

## 2017-08-04 NOTE — Patient Instructions (Signed)
Dehydration, Adult Dehydration is a condition in which there is not enough fluid or water in the body. This happens when you lose more fluids than you take in. Important organs, such as the kidneys, brain, and heart, cannot function without a proper amount of fluids. Any loss of fluids from the body can lead to dehydration. Dehydration can range from mild to severe. This condition should be treated right away to prevent it from becoming severe. What are the causes? This condition may be caused by:  Vomiting.  Diarrhea.  Excessive sweating, such as from heat exposure or exercise.  Not drinking enough fluid, especially: ? When ill. ? While doing activity that requires a lot of energy.  Excessive urination.  Fever.  Infection.  Certain medicines, such as medicines that cause the body to lose excess fluid (diuretics).  Inability to access safe drinking water.  Reduced physical ability to get adequate water and food.  What increases the risk? This condition is more likely to develop in people:  Who have a poorly controlled long-term (chronic) illness, such as diabetes, heart disease, or kidney disease.  Who are age 65 or older.  Who are disabled.  Who live in a place with high altitude.  Who play endurance sports.  What are the signs or symptoms? Symptoms of mild dehydration may include:  Thirst.  Dry lips.  Slightly dry mouth.  Dry, warm skin.  Dizziness. Symptoms of moderate dehydration may include:  Very dry mouth.  Muscle cramps.  Dark urine. Urine may be the color of tea.  Decreased urine production.  Decreased tear production.  Heartbeat that is irregular or faster than normal (palpitations).  Headache.  Light-headedness, especially when you stand up from a sitting position.  Fainting (syncope). Symptoms of severe dehydration may include:  Changes in skin, such as: ? Cold and clammy skin. ? Blotchy (mottled) or pale skin. ? Skin that does  not quickly return to normal after being lightly pinched and released (poor skin turgor).  Changes in body fluids, such as: ? Extreme thirst. ? No tear production. ? Inability to sweat when body temperature is high, such as in hot weather. ? Very little urine production.  Changes in vital signs, such as: ? Weak pulse. ? Pulse that is more than 100 beats a minute when sitting still. ? Rapid breathing. ? Low blood pressure.  Other changes, such as: ? Sunken eyes. ? Cold hands and feet. ? Confusion. ? Lack of energy (lethargy). ? Difficulty waking up from sleep. ? Short-term weight loss. ? Unconsciousness. How is this diagnosed? This condition is diagnosed based on your symptoms and a physical exam. Blood and urine tests may be done to help confirm the diagnosis. How is this treated? Treatment for this condition depends on the severity. Mild or moderate dehydration can often be treated at home. Treatment should be started right away. Do not wait until dehydration becomes severe. Severe dehydration is an emergency and it needs to be treated in a hospital. Treatment for mild dehydration may include:  Drinking more fluids.  Replacing salts and minerals in your blood (electrolytes) that you may have lost. Treatment for moderate dehydration may include:  Drinking an oral rehydration solution (ORS). This is a drink that helps you replace fluids and electrolytes (rehydrate). It can be found at pharmacies and retail stores. Treatment for severe dehydration may include:  Receiving fluids through an IV tube.  Receiving an electrolyte solution through a feeding tube that is passed through your nose   and into your stomach (nasogastric tube, or NG tube).  Correcting any abnormalities in electrolytes.  Treating the underlying cause of dehydration. Follow these instructions at home:  If directed by your health care provider, drink an ORS: ? Make an ORS by following instructions on the  package. ? Start by drinking small amounts, about  cup (120 mL) every 5-10 minutes. ? Slowly increase how much you drink until you have taken the amount recommended by your health care provider.  Drink enough clear fluid to keep your urine clear or pale yellow. If you were told to drink an ORS, finish the ORS first, then start slowly drinking other clear fluids. Drink fluids such as: ? Water. Do not drink only water. Doing that can lead to having too little salt (sodium) in the body (hyponatremia). ? Ice chips. ? Fruit juice that you have added water to (diluted fruit juice). ? Low-calorie sports drinks.  Avoid: ? Alcohol. ? Drinks that contain a lot of sugar. These include high-calorie sports drinks, fruit juice that is not diluted, and soda. ? Caffeine. ? Foods that are greasy or contain a lot of fat or sugar.  Take over-the-counter and prescription medicines only as told by your health care provider.  Do not take sodium tablets. This can lead to having too much sodium in the body (hypernatremia).  Eat foods that contain a healthy balance of electrolytes, such as bananas, oranges, potatoes, tomatoes, and spinach.  Keep all follow-up visits as told by your health care provider. This is important. Contact a health care provider if:  You have abdominal pain that: ? Gets worse. ? Stays in one area (localizes).  You have a rash.  You have a stiff neck.  You are more irritable than usual.  You are sleepier or more difficult to wake up than usual.  You feel weak or dizzy.  You feel very thirsty.  You have urinated only a small amount of very dark urine over 6-8 hours. Get help right away if:  You have symptoms of severe dehydration.  You cannot drink fluids without vomiting.  Your symptoms get worse with treatment.  You have a fever.  You have a severe headache.  You have vomiting or diarrhea that: ? Gets worse. ? Does not go away.  You have blood or green matter  (bile) in your vomit.  You have blood in your stool. This may cause stool to look black and tarry.  You have not urinated in 6-8 hours.  You faint.  Your heart rate while sitting still is over 100 beats a minute.  You have trouble breathing. This information is not intended to replace advice given to you by your health care provider. Make sure you discuss any questions you have with your health care provider. Document Released: 01/20/2005 Document Revised: 08/17/2015 Document Reviewed: 03/16/2015 Elsevier Interactive Patient Education  2018 [employer]  

## 2017-08-04 NOTE — Addendum Note (Signed)
Addended by: Caryl Comes E on: 08/04/2017 04:50 PM   Modules accepted: Orders

## 2017-08-05 LAB — PROTEIN ELECTROPHORESIS, SERUM
A/G Ratio: 1 (ref 0.7–1.7)
ALPHA-1-GLOBULIN: 0.3 g/dL (ref 0.0–0.4)
ALPHA-2-GLOBULIN: 0.7 g/dL (ref 0.4–1.0)
Albumin ELP: 3.3 g/dL (ref 2.9–4.4)
BETA GLOBULIN: 1 g/dL (ref 0.7–1.3)
GLOBULIN, TOTAL: 3.3 g/dL (ref 2.2–3.9)
Gamma Globulin: 1.3 g/dL (ref 0.4–1.8)
Total Protein ELP: 6.6 g/dL (ref 6.0–8.5)

## 2017-08-06 LAB — IFE, DARA-SPECIFIC, SERUM
IGM (IMMUNOGLOBULIN M), SRM: 57 mg/dL (ref 26–217)
IgA: 253 mg/dL (ref 87–352)
IgG (Immunoglobin G), Serum: 1357 mg/dL (ref 700–1600)

## 2017-08-07 ENCOUNTER — Other Ambulatory Visit: Payer: Self-pay

## 2017-08-07 DIAGNOSIS — C9012 Plasma cell leukemia in relapse: Secondary | ICD-10-CM

## 2017-08-07 MED ORDER — POMALIDOMIDE 2 MG PO CAPS: 2.0000 mg | ORAL_CAPSULE | Freq: Every day | ORAL | 0 refills | Status: DC

## 2017-08-16 NOTE — Progress Notes (Addendum)
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      10/10/2014 Miscellaneous    Peripheral blood chemistry and leukocytosis with total white count 78K, comprised of large plasma cells and his normocytic anemia. There is a myeloid left shift with previous surgical radium blasts. Flow cytometry showed 64% plasma cells      10/10/2014 Bone Marrow Biopsy    Markedly hypercellular marrow (95%), Atypical plasma cells comprise 57% of the cellularity. There was diminished multilineage in hematopoiesis with adequate maturation. Breasts less than 1%), no overt dysplasia of the myeloid or erythroid lineages.       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      05/07/2015 - 05/12/2015 Hospital Admission    [facility], [facility]. ID workup was negative, EGD showed evidence of gastritis and duodenitis, no H. pylori or CMV.      05/20/2015 - 05/24/2015 Hospital Ad[facility]    [facility]      07/11/2015 Bone Marrow Biopsy    Post transplant 100 a bone marrow biopsy showed hypocellular marrow, 20%, no increase in plasma cells (2%) or other abnormalities.      08/22/2015 - 01/02/2016 Chemotherapy    MaintenanceCyborD (cytoxan 3055mm2 iv, bortezomib 1.5 mg/m, dexamethasone 40 mg, every 2 weeks, changed to Velcade maintenance after 4 months treatment      01/16/2016 - 04/19/2016 Chemotherapy    Maintenance Velcade 1.3 mg/m every 2 weeks      02/22/2016 Pathology Results    BONE MARROW: Diagnosis Bone Marrow, Aspirate,Biopsy, and Clot, right iliac - NORMOCELLULAR BONE MARROW FOR AGE WITH TRILINEAGE HEMATOPOIESIS. - PLASMACYTOSIS (PLASMA CELLS 12%). - SEE COMMENT. PERIPHERAL BLOOD: - OCCASIONAL CIRCULATING PLASMA CELLS.      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       04/18/2016 - 03/26/2017 Chemotherapy    Daratumumab per protocol  CyBorD every week, cytoxan was held after 04/24/2016 dye to cytopenia and infection  -discontinued due to Hep B flare      05/11/2016 - 05/16/2016 Hospital Admission    Healthcare-associated pneumonia      02/16/2017 - 02/21/2017 Hospital Admission    Admission diagnosis: Abnormal LFT's Additional comments: Assoc diagnoses: Hep B flare, transminitis, dehydration, fever, SIRS      04/06/2017 -  Chemotherapy    Carfilzomib 56 mg/m2 on days 1 and 15 (except 2072m2 on C1D1 and C1D2) with dexamethasone 20 mg  on same days and pomalidomide 2 mg on days 1-21 of 28 day cycle         CURRENT THERAPY: carfilzomib 56 mg/m2 on days 1 and 15(except '20mg'$ /m2 on C1D1 and C1D2)with dexamethasone 20 mg on same days and pomalidomide 2 mg on days 1-21 of 28 day cycleon 04/13/17, dexa stopped on 07/06/2017 due to her CR  INTERVAL HISTORY: Ms. Illingworth returns for follow up as  scheduled prior to cycle 5 day 15 carfilzomib. She completed day 1 on 08/03/17. She developed uncontrolled nausea and vomiting and required [facility] visit with Sandi Mealy, PA with IVF, IV zofran, and IV decadron support. She reports her symptoms improved and she has been doing well in the interim. zofran usually manages her nausea with was not enough this time. She was off pomalidomide 7/4/ - 08/12/17. For chronic constipation she takes miralax BID, has BM every 4-5 days for many years. She has occasional abdominal cramping with constipation. She has occasional productive cough with yellow sputum, clear rhinorrhea, and dry eyes. Denies chest pain or dyspnea. She has occasional chill at night and difficulty relaxing, denies fever.   REVIEW OF SYSTEMS:   Constitutional: Denies fevers, decreased appetite, or abnormal weight loss Eyes: Denies blurriness of vision Ears, nose, mouth, throat, and face: Denies mucositis or sore throat (+) clear rhinorrhea  Respiratory: Denies dyspnea or wheezes (+) occasional productive cough  Cardiovascular: Denies palpitation, chest discomfort or lower extremity swelling Gastrointestinal:  Denies nausea, heartburn or change in bowel habits (+) n/v day 2, improved with [facility] visit and IV medication/IVF support (+) constipation, chronic (+) abd cramp with constipation  Skin: Denies abnormal skin rashes  Lymphatics: Denies new lymphadenopathy or easy bruising Neurological:Denies numbness, tingling or new weaknesses Behavioral/Psych: Mood is stable, no new changes  All other systems were reviewed with the patient and are negative.  MEDICAL HISTORY:  Past Medical History:  Diagnosis Date  . Chills with fever    intermittently since d/c from hospital  . Dysuria-frequency syndrome    w/ pink urine  . GERD (gastroesophageal reflux disease)   . Hepatitis   . History of positive PPD    DX 2011--  CXR DONE NO EVIDENCE  . History of ureter stent   . Hydronephrosis, right   .  Neuromuscular disorder (HCC)    legs numb intermittently  . 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[place])   . Pneumonia   . Right ureteral stone   . Urosepsis 8/14   admitted to [facility]    SURGICAL HISTORY: Past Surgical History:  Procedure Laterality Date  . CYSTOSCOPY W/ URETERAL STENT PLACEMENT Right 09/25/2012   Procedure: CYSTOSCOPY WITH RETROGRADE PYELOGRAM/URETERAL STENT PLACEMENT;  Surgeon: Alexis Frock, MD;  Location: [facility];  Service: Urology;  Laterality: Right;  . CYSTOSCOPY WITH RETROGRADE PYELOGRAM, URETEROSCOPY AND STENT PLACEMENT Right 10/15/2012   Procedure: CYSTOSCOPY WITH RETROGRADE PYELOGRAM, URETEROSCOPY AND REMOVAL STENT WITH  STENT PLACEMENT;  Surgeon: Alexis Frock, MD;  Location: [facility];  Service: Urology;  Laterality: Right;  . ESOPHAGOGASTRODUODENOSCOPY (EGD) WITH PROPOFOL N/A 11/16/2014   Procedure: ESOPHAGOGASTRODUODENOSCOPY (EGD) WITH PROPOFOL;  Surgeon: Milus Banister, MD;  Location: [facility];  Service: Endoscopy;  Laterality: N/A;  . HOLMIUM LASER APPLICATION Right 2/42/3536   Procedure: HOLMIUM LASER APPLICATION;  Surgeon: Alexis Frock, MD;  Location[facility];  Service: Urology;  Laterality: Right;  . LIVER BIOPSY    . OTHER SURGICAL HISTORY Right    removal of ovarian cyst  . removal of  uterine cyst     years ago  . RIGHT VATS W/ DRAINAGE PEURAL EFFUSION AND BX'S  10-30-2008    I have reviewed the social history and family history with the patient and they are unchanged from previous note.  ALLERGIES:  has No Known Allergies.  MEDICATIONS:  Current Outpatient Medications  Medication Sig Dispense Refill  . acyclovir (ZOVIRAX) 800 MG tablet Take 1 tablet (800 mg total) by mouth 2 (two) times daily. 60 tablet 5  . feeding supplement, ENSURE ENLIVE, (ENSURE ENLIVE) LIQD Take 237 mLs by mouth 2 (two) times daily between meals. 237 mL 12  . magic mouthwash SOLN Take 5 mLs by mouth 4 (four) times daily as needed for mouth pain.  240 mL 3  . omeprazole (PRILOSEC) 20 MG capsule Take 1 capsule (20 mg total) by mouth daily. 30 capsule 5  . ondansetron (ZOFRAN) 8 MG tablet Take 1 tablet (8 mg total) by mouth every 8 (eight) hours as needed for nausea or vomiting. 30 tablet 1  . polyethylene glycol (MIRALAX / GLYCOLAX) packet Take 17 g by mouth daily as needed (constipation).     . pomalidomide (POMALYST) 2 MG capsule Take 1 capsule (2 mg total) by mouth daily. Take with water on days 1-21. Repeat every 28 days. 21 capsule 0  . potassium chloride SA (K-DUR,KLOR-CON) 20 MEQ tablet Take 1 tablet (20 mEq total) by mouth daily. 30 tablet 0  . prochlorperazine (COMPAZINE) 10 MG tablet Take 1 tablet (10 mg total) by mouth every 6 (six) hours as needed for nausea or vomiting. 30 tablet 3  . promethazine (PHENERGAN) 25 MG tablet Take 1 tablet (25 mg total) by mouth every 6 (six) hours as needed for nausea or vomiting. 30 tablet 0  . VIREAD 300 MG tablet Take 1 tablet (300 mg total) by mouth daily. 30 tablet 11  . ursodiol (ACTIGALL) 300 MG capsule Take 1 capsule (300 mg total) by mouth 2 (two) times daily. 60 capsule 0   No current facility-administered medications for this visit.    Facility-Administered Medications Ordered in Other Visits  Medication Dose Route Frequency Provider Last Rate Last Dose  . sodium chloride 0.9 % injection 10 mL  10 mL Intravenous PRN Truitt Merle, MD   10 mL at 12/11/15 1532  . sodium chloride 0.9 % injection 10 mL  10 mL Intravenous PRN Truitt Merle, MD   10 mL at 06/11/16 1505  . sodium chloride 0.9 % injection 10 mL  10 mL Intravenous PRN Truitt Merle, MD   10 mL at 08/04/17 1643  . sodium chloride flush (NS) 0.9 % injection 10 mL  10 mL Intravenous PRN Truitt Merle, MD   10 mL at 10/09/15 1717  . sodium chloride flush (NS) 0.9 % injection 10 mL  10 mL Intracatheter PRN Truitt Merle, MD   10 mL at 08/17/17 1310    PHYSICAL EXAMINATION: ECOG PERFORMANCE STATUS: 1 - Symptomatic but completely ambulatory  Vitals:     08/17/17 0939  BP: 132/84  Pulse: 67  Resp: 18  Temp: 98.7 F (37.1 C)  SpO2: 100%   Filed Weights   08/17/17 0939  Weight: 131 lb 3.2 oz (59.5 kg)    GENERAL:alert, no distress and comfortable SKIN: no rashes or significant lesions EYES: normal, Conjunctiva are pink and non-injected, sclera clear OROPHARYNX:no thrush or ulcers   LYMPH:  no palpable cervical or supraclavicular lymphadenopathy LUNGS: clear to auscultation with normal breathing effort HEART: regular rate & rhythm and  no murmurs and no lower extremity edema ABDOMEN:abdomen soft, non-tender and normal bowel sounds Musculoskeletal:no cyanosis of digits and no clubbing  NEURO: alert & oriented x 3 with fluent speech, no focal motor/sensory deficits PAC without erythema   LABORATORY DATA:  I have reviewed the data as listed CBC Latest Ref Rng & Units 08/17/2017 08/03/2017 07/20/2017  WBC 3.9 - 10.3 K/uL 4.4 3.2(L) 4.1  Hemoglobin 11.6 - 15.9 g/dL 10.1(L) 9.4(L) 9.4(L)  Hematocrit 34.8 - 46.6 % 33.8(L) 31.3(L) 29.6(L)  Platelets 145 - 400 K/uL 292 150 303     CMP Latest Ref Rng & Units 08/17/2017 08/03/2017 07/20/2017  Glucose 70 - 99 mg/dL 81 81 98  BUN 6 - 20 mg/dL _0 Creatinine 0.44 - 1.00 mg/dL 0.74 0.71 0.73  Sodium 135 - 145 mmol/L 142 141 139  Potassium 3.5 - 5.1 mmol/L 3.8 3.8 3.9  Chloride 98 - 111 mmol/L 108 107 106  CO2 22 - 32 mmol/L _1 Calcium 8.9 - 10.3 mg/dL 9.4 9.6 9.2  Total Protein 6.5 - 8.1 g/dL 7.1 6.7 6.6  Total Bilirubin 0.3 - 1.2 mg/dL 0.2(L) 0.3 <0.2(L)  Alkaline Phos 38 - 126 U/L 109 83 124  AST 15 - 41 U/L 32 22 29  ALT 0 - 44 U/L _2 SPEP M-protein  09/15/2014: 4.2 10/08/14: 4.6 12/08/2014: 0.2 02/15/2015: not sufficient sample for test  09/11/2015: not observed 10/09/2015: not det  11/06/2015: not det  12/25/2015: not det  02/13/2016: 0.3 03/12/2016: 0.8 04/09/2016: 0.6 05/08/16: 0.1 06/04/16: 0.1 07/02/2016: 0.2 07/09/2016: 0.2 (dara specific IFE negative) 08/05/16: Not  observed  09/01/16: Not observed 09/29/16: 0.1(dara specific IFE negative) 10/27/16: Not observed 11/24/16 : Not observed 12/23/16: 0.1 (Dara specific IFE showed IgG monoclonal protein with lambda light chain) 01/21/2017: 0.1 (Dara specific IFE showed IgG monoclonal protein with lambda light chain) 03/26/17: 0.2 04/27/17: 0.1 05/25/17: Not observed 06/23/17: Not observed 07/20/17: Not observed  08/03/17: not observed  08/17/17: Pending    IgG mg/dl  11/03/2014: 3150  12/08/2014: 759 02/14/2014: 860 09/11/2015: 1347 10/09/2015: 1457 11/06/2015: 1504 12/25/2015: 1400 02/13/2016: 1543 03/12/2016: 1860 04/09/2016: 1620 05/08/16: 749 06/04/16: 751 5/30/20187: 796 07/09/2016: 701 08/05/16: 678 09/01/16: 650 09/29/16: 727 10/27/16: 634 11/24/16: 685  12/23/16: 714 01/21/2017: 709 03/26/17: 874 04/27/17: 1067 05/25/17: 1161 06/23/17: 1211 07/20/17: 1124     Kappa/lambda light chains levels and ration  11/03/14: 0.75, 152, 0.00 12/22/2014: 1.10, 2.60, 0.42 02/15/2015: 9.59,14.35, 0.67 09/11/2015: 2.44, 2.72, 0.90 10/09/2015: 3.09, 3.49, 0.89 11/06/2015: 2.30, 2.62, 0.88 12/25/2015: 2.51, 3.0, 0.84 02/13/2016: 2.64, 15.3, 0.17 03/12/2016: 2.27, 45.5, 0.05 04/09/2016: 3.33, 45.5, 0.07 05/08/2016: 0.49, 1.21, 0.40 06/04/2016: 0.83, 1.11, 0.75 07/02/16: 0.51, 1.43, 0.36 07/23/16: 0.68, 1.02, 0.67 08/05/16: 0.62, 1.09, 0.57 09/01/16: 0.80, 1.21, 0.66 09/29/16: 0.44, 1.07, 0.41 10/27/16: 0.63, 1.44, 0.44 11/24/16: 0.83, 2.13, 0.39 12/23/16: 0.69, 2.31, 0.30 01/21/2017: 0.77, 3.08, 0.25   24 h urine UPEP/IFE and light chain: 11/03/2014: IFE showed a monoclonal IgG heavy chain with associated lambda light chain. M protein was Undetectable    PATHOLOGY   PATHOLOGY REPORT  Bone Marrow (BM) and Peripheral Blood (PB) FINAL PATHOLOGIC DIAGNOSIS  BONE MARROW: 07/11/2015 Hypocellular marrow (20%) with no increase in plasma cells (2%). See comment. PERIPHERAL BLOOD: Mild anemia. No circulating plasma cells  identified. See comment and CBC data.  BONE MARROW: 02/22/2016 Diagnosis Bone Marrow, Aspirate,Biopsy, and Clot, right iliac - NORMOCELLULAR BONE MARROW FOR AGE WITH TRILINEAGE HEMATOPOIESIS. - PLASMACYTOSIS (PLASMA CELLS  12%). - SEE COMMENT. PERIPHERAL BLOOD: - OCCASIONAL CIRCULATING PLASMA CELLS.   PROCEDURES  Bone Marrow Biopsy, 01/21/17 Bone Marrow, Aspirate, Biopsy, and Clot - NORMOCELLULAR BONE MARROW FOR AGE WITH TRILINEAGE HEMATOPOIESIS AND 2% PLASMA CELLS. PERIPHERAL BLOOD: - NORMOCYTIC-HYPOCHROMIC ANEMIA.    RADIOGRAPHIC STUDIES: I have personally reviewed the radiological images as listed and agreed with the findings in the report. No results found.   ASSESSMENT & PLAN: 47 y.o.[place] woman, presented with anemia and leokocytosis   1. 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 transaminitis and hyperbilirubinemia, secondary to hepatitis B flare 3. 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 GERD, h/o GI bleeding and nausea  5. Constipation  6. Cough   Ms. Camba appears stable. She completed cycle 5 day 1 carfilzomib, tolerated moderately well overall. She developed n/v requiring IVF and IV anti-emetics. We reviewed oral anti-emetics and dosing for home. I refilled compazine for her, she had not been using recently; I recommend to alternate with zofran. She will call if symptoms are not well controlled. Labs reviewed: M protein on 7/1 is negative, pending today; CBC and CMP adequate to proceed with cycle 5 day 15 carfilzomib and continue pomalidomide 2 mg days 1-21 with 7 days off. She does not need a refill today.  For chronic constipation she continues miralax BID. If this is insufficient she can increase to TID or add stool softener. I recommend mag citrate if she has worsening constipation. She agrees. I encouraged her to eat fiber in her diet and maintain adequate hydration.   For productive cough x1  week, I recommend symptom management for now, if cough worsens or she develops fever, I urged her to call [facility] promptly, she agrees.   PLAN: -Labs reviewed, proceed with cylce 5 day 15 carfilzomib, pomalidomide 2 mg daily days 1-21 with 7 days off  -Reviewed symptom management for n/v, constipation, cough  -Refilled compazine, alternate with zofran for optimal anti-emetic control  -Lab, f/u in 2 weeks with cycle 6 -Zometa monthly  All questions were answered. The patient knows to call the clinic with any problems, questions or concerns. No barriers to learning was detected. I spent 20 minutes counseling the patient face to face. The total time spent in the appointment was 25 minutes and more than 50% was on counseling and review of test results     Alla Feeling, NP 08/17/17

## 2017-08-17 ENCOUNTER — Inpatient Hospital Stay: Payer: Medicaid Other

## 2017-08-17 ENCOUNTER — Encounter: Payer: Self-pay | Admitting: Nurse Practitioner

## 2017-08-17 ENCOUNTER — Inpatient Hospital Stay (HOSPITAL_BASED_OUTPATIENT_CLINIC_OR_DEPARTMENT_OTHER): Payer: Medicaid Other | Admitting: Nurse Practitioner

## 2017-08-17 VITALS — BP 132/84 | HR 67 | Temp 98.7°F | Resp 18 | Ht 63.0 in | Wt 131.2 lb

## 2017-08-17 DIAGNOSIS — B1910 Unspecified viral hepatitis B without hepatic coma: Secondary | ICD-10-CM

## 2017-08-17 DIAGNOSIS — Z9484 Stem cells transplant status: Secondary | ICD-10-CM

## 2017-08-17 DIAGNOSIS — K5900 Constipation, unspecified: Secondary | ICD-10-CM

## 2017-08-17 DIAGNOSIS — C9012 Plasma cell leukemia in relapse: Secondary | ICD-10-CM

## 2017-08-17 DIAGNOSIS — Z5112 Encounter for antineoplastic immunotherapy: Secondary | ICD-10-CM | POA: Diagnosis not present

## 2017-08-17 DIAGNOSIS — C9010 Plasma cell leukemia not having achieved remission: Secondary | ICD-10-CM

## 2017-08-17 DIAGNOSIS — E099 Drug or chemical induced diabetes mellitus without complications: Secondary | ICD-10-CM

## 2017-08-17 DIAGNOSIS — K5909 Other constipation: Secondary | ICD-10-CM | POA: Diagnosis not present

## 2017-08-17 DIAGNOSIS — R05 Cough: Secondary | ICD-10-CM

## 2017-08-17 DIAGNOSIS — R740 Nonspecific elevation of levels of transaminase and lactic acid dehydrogenase [LDH]: Secondary | ICD-10-CM

## 2017-08-17 DIAGNOSIS — K219 Gastro-esophageal reflux disease without esophagitis: Secondary | ICD-10-CM

## 2017-08-17 DIAGNOSIS — Z95828 Presence of other vascular implants and grafts: Secondary | ICD-10-CM

## 2017-08-17 DIAGNOSIS — R059 Cough, unspecified: Secondary | ICD-10-CM

## 2017-08-17 LAB — CBC WITH DIFFERENTIAL (CANCER CENTER ONLY)
BASOS PCT: 1 %
Basophils Absolute: 0.1 10*3/uL (ref 0.0–0.1)
Eosinophils Absolute: 0.1 10*3/uL (ref 0.0–0.5)
Eosinophils Relative: 2 %
HEMATOCRIT: 33.8 % — AB (ref 34.8–46.6)
Hemoglobin: 10.1 g/dL — ABNORMAL LOW (ref 11.6–15.9)
LYMPHS PCT: 20 %
Lymphs Abs: 0.9 10*3/uL (ref 0.9–3.3)
MCH: 26.6 pg (ref 25.1–34.0)
MCHC: 29.9 g/dL — ABNORMAL LOW (ref 31.5–36.0)
MCV: 88.9 fL (ref 79.5–101.0)
Monocytes Absolute: 0.4 10*3/uL (ref 0.1–0.9)
Monocytes Relative: 9 %
NEUTROS ABS: 3 10*3/uL (ref 1.5–6.5)
Neutrophils Relative %: 68 %
PLATELETS: 292 10*3/uL (ref 145–400)
RBC: 3.8 MIL/uL (ref 3.70–5.45)
RDW: 16.4 % — ABNORMAL HIGH (ref 11.2–14.5)
WBC: 4.4 10*3/uL (ref 3.9–10.3)

## 2017-08-17 LAB — CMP (CANCER CENTER ONLY)
ALT: 13 U/L (ref 0–44)
ANION GAP: 7 (ref 5–15)
AST: 32 U/L (ref 15–41)
Albumin: 3.4 g/dL — ABNORMAL LOW (ref 3.5–5.0)
Alkaline Phosphatase: 109 U/L (ref 38–126)
BUN: 11 mg/dL (ref 6–20)
CHLORIDE: 108 mmol/L (ref 98–111)
CO2: 27 mmol/L (ref 22–32)
Calcium: 9.4 mg/dL (ref 8.9–10.3)
Creatinine: 0.74 mg/dL (ref 0.44–1.00)
GFR, Est AFR Am: >60 mL/min (ref >60)
GFR, Estimated: >60 mL/min (ref >60)
Glucose, Bld: 81 mg/dL (ref 70–99)
POTASSIUM: 3.8 mmol/L (ref 3.5–5.1)
SODIUM: 142 mmol/L (ref 135–145)
Total Bilirubin: 0.2 mg/dL — ABNORMAL LOW (ref 0.3–1.2)
Total Protein: 7.1 g/dL (ref 6.5–8.1)

## 2017-08-17 LAB — PREGNANCY, URINE: Preg Test, Ur: NEGATIVE

## 2017-08-17 MED ORDER — DEXTROSE 5 % IV SOLN
56.0000 mg/m2 | Freq: Once | INTRAVENOUS | Status: AC
  Administered 2017-08-17: 90 mg via INTRAVENOUS
  Filled 2017-08-17: qty 30

## 2017-08-17 MED ORDER — SODIUM CHLORIDE 0.9% FLUSH
3.0000 mL | Freq: Once | INTRAVENOUS | Status: AC | PRN
  Administered 2017-08-17: 250 mL via INTRAVENOUS
  Filled 2017-08-17: qty 10

## 2017-08-17 MED ORDER — SODIUM CHLORIDE 0.9 % IV SOLN
Freq: Once | INTRAVENOUS | Status: AC
  Administered 2017-08-17: 11:00:00 via INTRAVENOUS

## 2017-08-17 MED ORDER — SODIUM CHLORIDE 0.9% FLUSH
10.0000 mL | INTRAVENOUS | Status: DC | PRN
Start: 2017-08-17 — End: 2017-08-17
  Administered 2017-08-17: 10 mL
  Filled 2017-08-17: qty 10

## 2017-08-17 MED ORDER — PROCHLORPERAZINE MALEATE 10 MG PO TABS
10.0000 mg | ORAL_TABLET | Freq: Once | ORAL | Status: AC
  Administered 2017-08-17: 10 mg via ORAL

## 2017-08-17 MED ORDER — HEPARIN SOD (PORK) LOCK FLUSH 100 UNIT/ML IV SOLN
500.0000 [IU] | Freq: Once | INTRAVENOUS | Status: AC | PRN
Start: 2017-08-17 — End: 2017-08-17
  Administered 2017-08-17: 500 [IU]
  Filled 2017-08-17: qty 5

## 2017-08-17 MED ORDER — PROCHLORPERAZINE MALEATE 10 MG PO TABS
ORAL_TABLET | ORAL | Status: AC
  Filled 2017-08-17: qty 1

## 2017-08-17 MED ORDER — PROCHLORPERAZINE MALEATE 10 MG PO TABS: 10.0000 mg | ORAL_TABLET | Freq: Four times a day (QID) | ORAL | 3 refills | Status: AC | PRN

## 2017-08-17 NOTE — Patient Instructions (Signed)
[facility] Discharge Instructions for Patients Receiving Chemotherapy  Today you received the following chemotherapy agents:  Kyprolis  To help prevent nausea and vomiting after your treatment, we encourage you to take your nausea medication as prescribed.   If you develop nausea and vomiting that is not controlled by your nausea medication, call the clinic.   BELOW ARE SYMPTOMS THAT SHOULD BE REPORTED IMMEDIATELY:  *FEVER GREATER THAN 100.5 F  *CHILLS WITH OR WITHOUT FEVER  NAUSEA AND VOMITING THAT IS NOT CONTROLLED WITH YOUR NAUSEA MEDICATION  *UNUSUAL SHORTNESS OF BREATH  *UNUSUAL BRUISING OR BLEEDING  TENDERNESS IN MOUTH AND THROAT WITH OR WITHOUT PRESENCE OF ULCERS  *URINARY PROBLEMS  *BOWEL PROBLEMS  UNUSUAL RASH Items with * indicate a potential emergency and should be followed up as soon as possible.  Feel free to call the clinic should you have any questions or concerns. The clinic phone number is [phone].  Please show the CHEMO ALERT CARD at check-in to the Emergency Department and triage nurse.   

## 2017-08-18 ENCOUNTER — Telehealth: Payer: Self-pay | Admitting: *Deleted

## 2017-08-18 ENCOUNTER — Telehealth: Payer: Self-pay

## 2017-08-18 LAB — PROTEIN ELECTROPHORESIS, SERUM
A/G Ratio: 0.8 (ref 0.7–1.7)
ALBUMIN ELP: 3.1 g/dL (ref 2.9–4.4)
ALPHA-1-GLOBULIN: 0.3 g/dL (ref 0.0–0.4)
Alpha-2-Globulin: 0.9 g/dL (ref 0.4–1.0)
BETA GLOBULIN: 1.1 g/dL (ref 0.7–1.3)
GAMMA GLOBULIN: 1.4 g/dL (ref 0.4–1.8)
Globulin, Total: 3.7 g/dL (ref 2.2–3.9)
TOTAL PROTEIN ELP: 6.8 g/dL (ref 6.0–8.5)

## 2017-08-18 NOTE — Telephone Encounter (Signed)
Patient's sister calls stating she had vomiting all night after her treatment yesterday.    I asked her to find out what medications she had taken for nausea as she has Zofran and Compazine.  Also if she is unable to keep anything down she most likely needs IVF.  Sister to check and call me back.

## 2017-08-18 NOTE — Telephone Encounter (Signed)
I called the patient left voice message to follow up on her N/V, requested she call back to let us know if it has stopped, what medications she is taking and if she needs to come in for IVF

## 2017-08-18 NOTE — Telephone Encounter (Signed)
Message left requesting return call to identify patient after receipt of voicemail "sister received chemotherapy yesterday, all night sick throwing up.  Please call back.  [phone]."

## 2017-08-20 LAB — IFE, DARA-SPECIFIC, SERUM
IGA: 235 mg/dL (ref 87–352)
IGM (IMMUNOGLOBULIN M), SRM: 51 mg/dL (ref 26–217)
IgG (Immunoglobin G), Serum: 1336 mg/dL (ref 700–1600)

## 2017-08-26 ENCOUNTER — Emergency Department (HOSPITAL_COMMUNITY): Payer: Medicaid Other

## 2017-08-26 ENCOUNTER — Inpatient Hospital Stay (HOSPITAL_COMMUNITY)
Admission: EM | Admit: 2017-08-26 | Discharge: 2017-08-29 | DRG: 871 | Disposition: A | Discharge status: Home / Self Care | Payer: Medicaid Other | Attending: Family Medicine | Admitting: Family Medicine

## 2017-08-26 ENCOUNTER — Other Ambulatory Visit: Payer: Self-pay

## 2017-08-26 ENCOUNTER — Encounter (HOSPITAL_COMMUNITY): Payer: Self-pay

## 2017-08-26 ENCOUNTER — Telehealth: Payer: Self-pay | Admitting: *Deleted

## 2017-08-26 DIAGNOSIS — J189 Pneumonia, unspecified organism: Secondary | ICD-10-CM | POA: Diagnosis present

## 2017-08-26 DIAGNOSIS — E099 Drug or chemical induced diabetes mellitus without complications: Secondary | ICD-10-CM | POA: Diagnosis present

## 2017-08-26 DIAGNOSIS — C9012 Plasma cell leukemia in relapse: Secondary | ICD-10-CM

## 2017-08-26 DIAGNOSIS — K7689 Other specified diseases of liver: Secondary | ICD-10-CM | POA: Diagnosis present

## 2017-08-26 DIAGNOSIS — C9010 Plasma cell leukemia not having achieved remission: Secondary | ICD-10-CM | POA: Diagnosis present

## 2017-08-26 DIAGNOSIS — A419 Sepsis, unspecified organism: Secondary | ICD-10-CM

## 2017-08-26 DIAGNOSIS — D630 Anemia in neoplastic disease: Secondary | ICD-10-CM | POA: Diagnosis present

## 2017-08-26 DIAGNOSIS — E876 Hypokalemia: Secondary | ICD-10-CM | POA: Diagnosis present

## 2017-08-26 DIAGNOSIS — D899 Disorder involving the immune mechanism, unspecified: Secondary | ICD-10-CM | POA: Diagnosis present

## 2017-08-26 DIAGNOSIS — K219 Gastro-esophageal reflux disease without esophagitis: Secondary | ICD-10-CM | POA: Diagnosis present

## 2017-08-26 DIAGNOSIS — J181 Lobar pneumonia, unspecified organism: Secondary | ICD-10-CM | POA: Diagnosis present

## 2017-08-26 DIAGNOSIS — B181 Chronic viral hepatitis B without delta-agent: Secondary | ICD-10-CM | POA: Diagnosis present

## 2017-08-26 DIAGNOSIS — Z800 Family history of malignant neoplasm of digestive organs: Secondary | ICD-10-CM

## 2017-08-26 DIAGNOSIS — R109 Unspecified abdominal pain: Secondary | ICD-10-CM | POA: Diagnosis present

## 2017-08-26 DIAGNOSIS — E611 Iron deficiency: Secondary | ICD-10-CM | POA: Diagnosis present

## 2017-08-26 LAB — URINALYSIS, ROUTINE W REFLEX MICROSCOPIC
BILIRUBIN URINE: NEGATIVE
GLUCOSE, UA: NEGATIVE mg/dL
HGB URINE DIPSTICK: NEGATIVE
KETONES UR: NEGATIVE mg/dL
NITRITE: NEGATIVE
Protein, ur: NEGATIVE mg/dL
Specific Gravity, Urine: 1.011 (ref 1.005–1.030)
pH: 8 (ref 5.0–8.0)

## 2017-08-26 LAB — COMPREHENSIVE METABOLIC PANEL
ALK PHOS: 58 U/L (ref 38–126)
ALT: 8 U/L (ref 0–44)
ANION GAP: 11 (ref 5–15)
AST: 22 U/L (ref 15–41)
Albumin: 3.3 g/dL — ABNORMAL LOW (ref 3.5–5.0)
BUN: 12 mg/dL (ref 6–20)
CALCIUM: 9.4 mg/dL (ref 8.9–10.3)
CHLORIDE: 108 mmol/L (ref 98–111)
CO2: 27 mmol/L (ref 22–32)
Creatinine, Ser: 0.75 mg/dL (ref 0.44–1.00)
GFR calc non Af Amer: >60 mL/min (ref >60)
Glucose, Bld: 85 mg/dL (ref 70–99)
POTASSIUM: 3.4 mmol/L — AB (ref 3.5–5.1)
SODIUM: 146 mmol/L — AB (ref 135–145)
Total Bilirubin: 0.8 mg/dL (ref 0.3–1.2)
Total Protein: 6.7 g/dL (ref 6.5–8.1)

## 2017-08-26 LAB — CBC WITH DIFFERENTIAL/PLATELET
BASOS PCT: 0 %
Basophils Absolute: 0 10*3/uL (ref 0.0–0.1)
EOS ABS: 0 10*3/uL (ref 0.0–0.7)
Eosinophils Relative: 0 %
HCT: 32.2 % — ABNORMAL LOW (ref 36.0–46.0)
HEMOGLOBIN: 10 g/dL — AB (ref 12.0–15.0)
LYMPHS ABS: 1.5 10*3/uL (ref 0.7–4.0)
Lymphocytes Relative: 17 %
MCH: 27.1 pg (ref 26.0–34.0)
MCHC: 31.1 g/dL (ref 30.0–36.0)
MCV: 87.3 fL (ref 78.0–100.0)
Monocytes Absolute: 1.5 10*3/uL — ABNORMAL HIGH (ref 0.1–1.0)
Monocytes Relative: 17 %
NEUTROS PCT: 66 %
Neutro Abs: 6 10*3/uL (ref 1.7–7.7)
Platelets: 150 10*3/uL (ref 150–400)
RBC: 3.69 MIL/uL — ABNORMAL LOW (ref 3.87–5.11)
RDW: 16.4 % — ABNORMAL HIGH (ref 11.5–15.5)
WBC: 9 10*3/uL (ref 4.0–10.5)

## 2017-08-26 LAB — I-STAT BETA HCG BLOOD, ED (MC, WL, AP ONLY): I-stat hCG, quantitative: 5.3 m[IU]/mL — ABNORMAL HIGH (ref <5)

## 2017-08-26 LAB — PROTIME-INR
INR: 1.08
PROTHROMBIN TIME: 13.9 s (ref 11.4–15.2)

## 2017-08-26 LAB — LIPASE, BLOOD: Lipase: 23 U/L (ref 11–51)

## 2017-08-26 LAB — I-STAT CG4 LACTIC ACID, ED: Lactic Acid, Venous: 1.25 mmol/L (ref 0.5–1.9)

## 2017-08-26 LAB — PREGNANCY, URINE: Preg Test, Ur: NEGATIVE

## 2017-08-26 MED ORDER — IBUPROFEN 200 MG PO TABS
600.0000 mg | ORAL_TABLET | Freq: Once | ORAL | Status: AC
  Administered 2017-08-26: 600 mg via ORAL
  Filled 2017-08-26: qty 3

## 2017-08-26 MED ORDER — ACETAMINOPHEN 325 MG PO TABS
650.0000 mg | ORAL_TABLET | Freq: Once | ORAL | Status: DC
  Filled 2017-08-26: qty 2

## 2017-08-26 MED ORDER — FENTANYL CITRATE (PF) 100 MCG/2ML IJ SOLN
25.0000 ug | Freq: Once | INTRAMUSCULAR | Status: AC
  Administered 2017-08-26: 25 ug via INTRAVENOUS
  Filled 2017-08-26: qty 2

## 2017-08-26 MED ORDER — ONDANSETRON HCL 4 MG/2ML IJ SOLN
4.0000 mg | Freq: Once | INTRAMUSCULAR | Status: AC
  Administered 2017-08-26: 4 mg via INTRAVENOUS
  Filled 2017-08-26: qty 2

## 2017-08-26 MED ORDER — IOPAMIDOL (ISOVUE-300) INJECTION 61%
100.0000 mL | Freq: Once | INTRAVENOUS | Status: AC | PRN
  Administered 2017-08-26: 100 mL via INTRAVENOUS

## 2017-08-26 MED ORDER — SODIUM CHLORIDE 0.9 % IV BOLUS: 1000.0000 mL | Freq: Once | INTRAVENOUS | Status: DC

## 2017-08-26 MED ORDER — PIPERACILLIN-TAZOBACTAM 3.375 G IVPB 30 MIN
3.3750 g | Freq: Once | INTRAVENOUS | Status: AC
  Administered 2017-08-26: 3.375 g via INTRAVENOUS
  Filled 2017-08-26: qty 50

## 2017-08-26 MED ORDER — SODIUM CHLORIDE 0.9 % IV BOLUS
1000.0000 mL | Freq: Once | INTRAVENOUS | Status: AC
  Administered 2017-08-26: 1000 mL via INTRAVENOUS

## 2017-08-26 MED ORDER — VANCOMYCIN HCL IN DEXTROSE 1-5 GM/200ML-% IV SOLN
1000.0000 mg | Freq: Once | INTRAVENOUS | Status: AC
  Administered 2017-08-26: 1000 mg via INTRAVENOUS
  Filled 2017-08-26: qty 200

## 2017-08-26 NOTE — ED Notes (Addendum)
Pt is alert and oriented x 4 and is verbally responsive.Pt reports having abdominal pain 6/10 nausea and vomiting  with associated fever. Pt has hx of leukemia and is in remission had chemo last on 7/15.

## 2017-08-26 NOTE — Progress Notes (Signed)
Pharmacy Note   A consult was received from an ED physician for vancomycin and Zosyn per pharmacy dosing.    The patient's profile has been reviewed for ht/wt/allergies/indication/available labs.    A one time order has been placed for vancomycin 1000 mg IV x1 and Zosyn 3.375 gr IV x 1 .  Further antibiotics/pharmacy consults should be ordered by admitting physician if indicated.                       Thank you,   Royetta Asal, PharmD, BCPS Pager [phone] 08/26/2017 9:10 PM

## 2017-08-26 NOTE — ED Provider Notes (Signed)
[facility] DEPT Provider Note   CSN: [id number] Arrival date & time: 08/26/17  1943     History   Chief Complaint Chief Complaint  Patient presents with  . Abdominal Pain  . Fever    HPI Sheryl [name] is a 47 y.o. female with a hx of leukemia receiving chemotherapy, GERD, chronic hepatitis B, sepsis, and steroid induced DM who presents to the emergency department with her daughter for fever and abdominal pain that started yesterday.  Per patient's daughter she developed a fever and subsequent generalized abdominal pain.  Abdominal pain is been constant, a 6 out of 10 in severity at present, no specific alleviating or aggravating factors.  She developed nausea with associated vomiting today, she has vomited approximately 5-6 times, no blood in emesis.  She did try taking ibuprofen this morning for her symptoms without significant change.  Her last bowel movement was this morning which was hard and she had to strain. She has had a dry cough for a few weeks now that has been progressively worsening..  Denies diarrhea, dysuria, hematochezia, chest pain, or dyspnea.   Patient is currently receiving IV and PO chemotherapy.   Patient's daughter is at bedside assisting with translation.   HPI  Past Medical History:  Diagnosis Date  . Chills with fever    intermittently since d/c from hospital  . Dysuria-frequency syndrome    w/ pink urine  . GERD (gastroesophageal reflux disease)   . Hepatitis   . History of positive PPD    DX 2011--  CXR DONE NO EVIDENCE  . History of ureter stent   . Hydronephrosis, right   . Neuromuscular disorder (HCC)    legs numb intermittently  . 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 Pneumonia   . Right ureteral stone   . Urosepsis 8/14   admitted to [facility]    Patient Active Problem List   Diagnosis Date Noted  . Hypokalemia 03/03/2017  . Cough   . Hyperbilirubinemia   . SIRS (systemic inflammatory response syndrome) ([place]) 02/16/2017    . Thrush 02/16/2017  . Chills with fever   . Right lower lobe pneumonia ([place]) 02/09/2017  . Transaminitis 02/09/2017  . HCAP (healthcare-associated pneumonia) 05/11/2016  . Influenza B 05/11/2016  . Vomiting 05/11/2016  . Leukopenia 05/11/2016  . 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 Hypersensitivity reaction 04/18/2016  . Hemorrhoid 12/03/2015  . Headache 12/03/2015  . Dehydration 11/14/2015  . Fever 11/14/2015  . 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 Port catheter in place 05/30/2015  . Acute pyelonephritis   . Immunosuppressed status ([place])   . UTI (urinary tract infection) 05/21/2015  . Sepsis ([place]) 05/20/2015  . Hepatitis 10/24/2014  . 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 GIB (gastrointestinal bleeding) 10/07/2014  . Healthcare-associated pneumonia 10/07/2014  . Constipation 09/17/2014  . Leukocytosis 09/14/2014  . Nausea & vomiting 09/14/2014  . Abdominal pain 09/14/2014  . Abnormal LFTs 09/14/2014  . Bilateral leg numbness 09/14/2014  . Normocytic anemia 09/14/2014  . GERD (gastroesophageal reflux disease)   . Chronic hepatitis B without delta agent without hepatic coma (HCC)   . Gastroesophageal reflux disease without esophagitis     Past Surgical History:  Procedure Laterality Date  . CYSTOSCOPY W/ URETERAL STENT PLACEMENT Right 09/25/2012   Procedure: CYSTOSCOPY WITH RETROGRADE PYELOGRAM/URETERAL STENT PLACEMENT;  Surgeon: Alexis Frock, MD;  Location: [facility];  Service: Urology;  Laterality: Right;  . CYSTOSCOPY WITH RETROGRADE PYELOGRAM, URETEROSCOPY AND STENT PLACEMENT  Right 10/15/2012   Procedure: CYSTOSCOPY WITH RETROGRADE PYELOGRAM, URETEROSCOPY AND REMOVAL STENT WITH  STENT PLACEMENT;  Surgeon: Alexis Frock, MD;  Location: [facility];  Service: Urology;  Laterality: Right;  . ESOPHAGOGASTRODUODENOSCOPY (EGD) WITH PROPOFOL N/A 11/16/2014   Procedure: ESOPHAGOGASTRODUODENOSCOPY (EGD) WITH PROPOFOL;  Surgeon: Milus Banister, MD;  Location: [facility];  Service: Endoscopy;  Laterality: N/A;  . HOLMIUM LASER APPLICATION Right 2/83/1517   Procedure: HOLMIUM LASER APPLICATION;  Surgeon: Alexis Frock, MD;  Location: [facility];  Service: Urology;  Laterality: Right;  . LIVER BIOPSY    . OTHER SURGICAL HISTORY Right    removal of ovarian cyst  . removal of uterine cyst     years ago  . RIGHT VATS W/ DRAINAGE PEURAL EFFUSION AND BX'S  10-30-2008     OB History   None      Home Medications    Prior to Admission medications   Medication Sig Start Date End Date Taking? Authorizing Provider  acyclovir (ZOVIRAX) 800 MG tablet Take 1 tablet (800 mg total) by mouth 2 (two) times daily. 07/06/17   Truitt Merle, MD  feeding supplement, ENSURE ENLIVE, (ENSURE ENLIVE) LIQD Take 237 mLs by mouth 2 (two) times daily between meals. 02/22/17   Elodia Florence., MD  magic mouthwash SOLN Take 5 mLs by mouth 4 (four) times daily as needed for mouth pain. 06/23/17   Tanner, Lyndon Code., PA-C  omeprazole (PRILOSEC) 20 MG capsule Take 1 capsule (20 mg total) by mouth daily. 06/08/17   Truitt Merle, MD  ondansetron (ZOFRAN) 8 MG tablet Take 1 tablet (8 mg total) by mouth every 8 (eight) hours as needed for nausea or vomiting. 02/18/16   Truitt Merle, MD  polyethylene glycol [facility] / Floria Raveling) packet Take 17 g by mouth daily as needed (constipation).     [provider]  pomalidomide (POMALYST) 2 MG capsule Take 1 capsule (2 mg total) by mouth daily. Take with water on days 1-21. Repeat every 28 days. 08/07/17   Truitt Merle, MD  potassium chloride SA (K-DUR,KLOR-CON) 20 MEQ tablet Take 1 tablet (20 mEq total) by mouth daily. 04/14/17   Alla Feeling, NP  prochlorperazine (COMPAZINE) 10 MG tablet Take 1 tablet (10 mg total) by mouth every 6 (six) hours as needed for nausea or vomiting. 08/17/17   Alla Feeling, NP  promethazine (PHENERGAN) 25 MG tablet Take 1 tablet (25 mg total) by mouth every 6 (six) hours as  needed for nausea or vomiting. 05/16/16   Robbie Lis, MD  ursodiol (ACTIGALL) 300 MG capsule Take 1 capsule (300 mg total) by mouth 2 (two) times daily. 04/13/17   Alla Feeling, NP  VIREAD 300 MG tablet Take 1 tablet (300 mg total) by mouth daily. 06/24/17   Carlyle Basques, MD    Family History Family History  Problem Relation Age of Onset  . Stomach cancer Mother   . Lung disease Father   . Asthma Father     Social History Social History   Tobacco Use  . Smoking status: Never Smoker  . Smokeless tobacco: Never Used  Substance Use Topics  . Alcohol use: No    Alcohol/week: 0.0 oz  . Drug use: No     Allergies   Patient has no known allergies.   Review of Systems Review of Systems  Constitutional: Positive for fever.  Respiratory: Positive for cough. Negative for shortness of breath.   Cardiovascular: Negative for chest  pain.  Gastrointestinal: Positive for abdominal pain, constipation, nausea and vomiting. Negative for blood in stool and diarrhea.  Genitourinary: Negative for dysuria.  All other systems reviewed and are negative.    Physical Exam Updated Vital Signs BP 97/71 (BP Location: Left Arm)   Pulse (!) 108   Temp (!) 103.1 F (39.5 C) (Oral)   Resp 20   Ht 5\' 3"  (1.6 m)   Wt 59 kg (130 lb)   SpO2 99%   BMI 23.03 kg/m   Physical Exam  Constitutional: She appears well-developed and well-nourished. Ill appearance: somewhat.  HENT:  Head: Normocephalic and atraumatic.  Mouth/Throat: Mucous membranes are dry. No oropharyngeal exudate or posterior oropharyngeal erythema.  Eyes: Conjunctivae are normal. Right eye exhibits no discharge. Left eye exhibits no discharge.  Cardiovascular: Normal rate and regular rhythm.  No murmur heard. Pulmonary/Chest: No respiratory distress. She has no wheezes. Rhonchi: bibasilar.  Bibasilar course breath sounds.   Port in place to chest wall without surrounding erythema or drainage.  Abdominal: Soft. Normal  appearance. She exhibits no distension. There is generalized tenderness. There is no rigidity, no rebound and no guarding.  Neurological: She is alert.  Clear speech.   Skin: Skin is warm and dry. No rash noted.  Psychiatric: She has a normal mood and affect. Her behavior is normal.  Nursing note and vitals reviewed.    ED Treatments / Results  Labs Results for orders placed or performed during the hospital encounter of 08/26/17  Comprehensive metabolic panel  Result Value Ref Range   Sodium 146 (H) 135 - 145 mmol/L   Potassium 3.4 (L) 3.5 - 5.1 mmol/L   Chloride 108 98 - 111 mmol/L   CO2 27 22 - 32 mmol/L   Glucose, Bld 85 70 - 99 mg/dL   BUN 12 6 - 20 mg/dL   Creatinine, Ser 0.75 0.44 - 1.00 mg/dL   Calcium 9.4 8.9 - 10.3 mg/dL   Total Protein 6.7 6.5 - 8.1 g/dL   Albumin 3.3 (L) 3.5 - 5.0 g/dL   AST 22 15 - 41 U/L   ALT 8 0 - 44 U/L   Alkaline Phosphatase 58 38 - 126 U/L   Total Bilirubin 0.8 0.3 - 1.2 mg/dL   GFR calc non Af Amer >60 >60 mL/min   GFR calc Af Amer >60 >60 mL/min   Anion gap 11 5 - 15  CBC with Differential  Result Value Ref Range   WBC 9.0 4.0 - 10.5 K/uL   RBC 3.69 (L) 3.87 - 5.11 MIL/uL   Hemoglobin 10.0 (L) 12.0 - 15.0 g/dL   HCT 32.2 (L) 36.0 - 46.0 %   MCV 87.3 78.0 - 100.0 fL   MCH 27.1 26.0 - 34.0 pg   MCHC 31.1 30.0 - 36.0 g/dL   RDW 16.4 (H) 11.5 - 15.5 %   Platelets 150 150 - 400 K/uL   Neutrophils Relative % 66 %   Neutro Abs 6.0 1.7 - 7.7 K/uL   Lymphocytes Relative 17 %   Lymphs Abs 1.5 0.7 - 4.0 K/uL   Monocytes Relative 17 %   Monocytes Absolute 1.5 (H) 0.1 - 1.0 K/uL   Eosinophils Relative 0 %   Eosinophils Absolute 0.0 0.0 - 0.7 K/uL   Basophils Relative 0 %   Basophils Absolute 0.0 0.0 - 0.1 K/uL  Protime-INR  Result Value Ref Range   Prothrombin Time 13.9 11.4 - 15.2 seconds   INR 1.08   Urinalysis, Routine w reflex  microscopic  Result Value Ref Range   Color, Urine YELLOW YELLOW   APPearance CLEAR CLEAR   Specific  Gravity, Urine 1.011 1.005 - 1.030   pH 8.0 5.0 - 8.0   Glucose, UA NEGATIVE NEGATIVE mg/dL   Hgb urine dipstick NEGATIVE NEGATIVE   Bilirubin Urine NEGATIVE NEGATIVE   Ketones, ur NEGATIVE NEGATIVE mg/dL   Protein, ur NEGATIVE NEGATIVE mg/dL   Nitrite NEGATIVE NEGATIVE   Leukocytes, UA TRACE (A) NEGATIVE   RBC / HPF 0-5 0 - 5 RBC/hpf   WBC, UA 21-50 0 - 5 WBC/hpf   Bacteria, UA RARE (A) NONE SEEN   Squamous Epithelial / LPF 0-5 0 - 5  Lipase, blood  Result Value Ref Range   Lipase 23 11 - 51 U/L  Pregnancy, urine  Result Value Ref Range   Preg Test, Ur NEGATIVE NEGATIVE  I-Stat CG4 Lactic Acid, ED  Result Value Ref Range   Lactic Acid, Venous 1.25 0.5 - 1.9 mmol/L  I-Stat beta hCG blood, ED  Result Value Ref Range   I-stat hCG, quantitative 5.3 (H) <5 mIU/mL   Comment 3           EKG None  Radiology Dg Chest 2 View  Result Date: 08/26/2017 CLINICAL DATA:  Fever EXAM: CHEST - 2 VIEW COMPARISON:  06/08/2017, 04/06/2017 FINDINGS: Right-sided central venous port tip over the cavoatrial region. Patchy right middle lobe opacity. Stable borderline cardiomegaly. No pneumothorax. IMPRESSION: Patchy right middle lobe opacity may reflect atelectasis or a mild infiltrate. Electronically Signed   By: Donavan Foil M.D.   On: 08/26/2017 21:17   Ct Abdomen Pelvis W Contrast  Result Date: 08/26/2017 CLINICAL DATA:  Abdominal pain and fever starting yesterday. Vomiting. Currently undergoing chemotherapy for leukemia. EXAM: CT ABDOMEN AND PELVIS WITH CONTRAST TECHNIQUE: Multidetector CT imaging of the abdomen and pelvis was performed using the standard protocol following bolus administration of intravenous contrast. CONTRAST:  178mL ISOVUE-300 IOPAMIDOL (ISOVUE-300) INJECTION 61% COMPARISON:  05/20/2015 FINDINGS: Lower chest: Motion artifact limits examination. There is hazy parenchymal opacity diffusely throughout the lung bases which may indicate edema or pneumonia. There is focal consolidation  in the inferior right middle lung likely due to pneumonia. Mild cardiac enlargement. Hepatobiliary: Cyst in the right lobe of the liver measuring 12 mm diameter. No change since prior study. Gallbladder and bile ducts are unremarkable. Pancreas: Unremarkable. No pancreatic ductal dilatation or surrounding inflammatory changes. Spleen: There is a multiloculated cystic mass lateral and posterior to the spleen, possibly involving the splenic capsule. The lesion measures up to about 2 x 7.4 cm in diameter. This is similar in appearance to previous study without significant progression. Adrenals/Urinary Tract: No adrenal gland nodules. Subcentimeter renal cysts bilaterally. 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 No hydronephrosis or hydroureter. No ureteral stones. Bladder wall is thickened possibly indicating cystitis. No bladder filling defects. Stomach/Bowel: Stomach, small bowel, and colon are not abnormally distended. Stool diffusely fills the colon. No wall thickening or inflammatory changes. The appendix is not identified. Vascular/Lymphatic: No significant vascular findings are present. No enlarged abdominal or pelvic lymph nodes. Reproductive: Nodular enlargement of the uterus likely representing fibroids. No abnormal adnexal masses. Other: No free air or free fluid in the abdomen. Abdominal wall musculature appears intact. Musculoskeletal: No acute or significant osseous findings. Calcifications in the subcutaneous fat over the gluteal regions likely representing injection granulomas. IMPRESSION: 1. Patchy parenchymal opacity in the lung bases  may indicate edema or pneumonia. Focal consolidation in the inferior right middle lung likely represents pneumonia. 2. No evidence of bowel obstruction or inflammation. Bladder wall thickening may indicate cystitis. 3. 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 This is similar to previous study  without progression. 4. 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 No change. 5. Uterine fibroids. Electronically Signed   By: Lucienne Capers M.D.   On: 08/26/2017 23:52    Procedures Procedures (including critical care time)  CRITICAL CARE Performed by: Kennith Maes   Total critical care time: 35 minutes  Critical care time was exclusive of separately billable procedures and treating other patients.  Critical care was necessary to treat or prevent imminent or life-threatening deterioration.  Critical care was time spent personally by me on the following activities: development of treatment plan with patient and/or surrogate as well as nursing, discussions with consultants, evaluation of patient's response to treatment, examination of patient, obtaining history from patient or surrogate, ordering and performing treatments and interventions, ordering and review of laboratory studies, ordering and review of radiographic studies, pulse oximetry and re-evaluation of patient's condition.  Medications Ordered in ED Medications  sodium chloride 0.9 % bolus 1,000 mL (1,000 mLs Intravenous New Bag/Given 08/26/17 2123)  piperacillin-tazobactam (ZOSYN) IVPB 3.375 g (3.375 g Intravenous New Bag/Given 08/26/17 2156)  vancomycin (VANCOCIN) IVPB 1000 mg/200 mL premix (1,000 mg Intravenous New Bag/Given 08/26/17 2156)  fentaNYL (SUBLIMAZE) injection 25 mcg (25 mcg Intravenous Given 08/26/17 2123)  ondansetron (ZOFRAN) injection 4 mg (4 mg Intravenous Given 08/26/17 2123)  ibuprofen (ADVIL,MOTRIN) tablet 600 mg (600 mg Oral Given 08/26/17 2153)  iopamidol (ISOVUE-300) 61 % injection 100 mL (100 mLs Intravenous Contrast Given 08/26/17 2321)     Initial Impression / Assessment and Plan / ED Course  I have reviewed the triage vital signs and the nursing notes.  Pertinent labs & imaging results that were available during my care of the patient were  reviewed by me and considered in my medical decision making (see chart for details).   Patient with history of leukemia receiving chemotherapy presents with fever and abdominal pain with nausea and vomiting as well as cough.  Patient notably febrile and tachycardic in triage, heart rate normalized on my exam but does fluctuate, pressures are soft, but appears that they have been at some of her office visits recently.  She has generalized abdominal tenderness without peritoneal signs.  She also has course breath sounds. Code sepsis initiated with broad-spectrum antibiotics.  Will obtain labs, chest x-ray, and CT abdomen pelvis with contrast.  Supervising physician Dr. Sherry Ruffing has been made aware of the patient in agreement with plan.   RN informed me that patient does not wish to take Tylenol she feels like it makes her achy all over, ibuprofen ordered as alternative for antipyretic.  Labs reviewed and fairly unremarkable.  No leukocytosis or leukopenia. ANC 6.0.  Patient's anemia with hemoglobin of 10.0 appears at baseline.  Mild hyponatremia at 146, patient is receiving fluids, mild hypokalemia at 3.4.  Lipase, LFTs, and renal function are within normal limits.  Urinalysis does not appear consistent with obvious infection. Lactic acid WNL, patient has received 1L NS, additional 1L ordered, given pressures have remained consistent with BP upon arrival, have not ordered weight based fluids.    Chest x-ray and abdominal CT with findings consistent with pneumonia, this appears to be patient's etiology for sepsis.  CT abdomen pelvis without alternative etiology, no evidence  of bowel obstruction or inflammation, some bladder wall thickening, there is a complex cystic mass adjacent to the spleen which has been seen on previous studies, she also has bilateral nonobstructing renal stones as well as uterine fibroids. Patient tolerating PO in the ER. Discussed all findings and plan of care with supervising physician  Dr. Sherry Ruffing, in agreement with plan for admission for sepsis secondary to pneumonia.   00:24: CONSULT: Discussed case with hospitalist Dr. [name] Brick who accepts admission.   Final Clinical Impressions(s) / ED Diagnoses   Final diagnoses:  Sepsis, due to unspecified organism [facility])    ED Discharge Orders    None       Amaryllis Dyke, PA-C 08/27/17 0028    Tegeler, Gwenyth Allegra, MD 08/27/17 [phone]

## 2017-08-26 NOTE — ED Triage Notes (Addendum)
Pt presents to ED from home for ABD pain and fever. Pt reports that the pain started yesterday, and she has been vomiting since then. Pt reports taking ibuprofen for fever with no relief. Currently undergoing chemo for leukemia.

## 2017-08-26 NOTE — Telephone Encounter (Signed)
"  Trying to reach you all before you close.  My mother called me.  Say's she's been hot and clammy since this morning.  Took tylenol and fever has not gone down.  She's vomited three times since this morning also.  She has not checked her temperature she just knows she feels very hot.  May have said she feels cold but I did not talk to her very long to call office.  What should I do?"  Verbal order received and read back from Dr. Burr Medico for [facility] to report to the ER.  .  Order given to patient's daughter at this time.

## 2017-08-26 NOTE — ED Notes (Signed)
EKG given to EDP,Belfi,MD., for review.

## 2017-08-27 ENCOUNTER — Encounter (HOSPITAL_COMMUNITY): Payer: Self-pay

## 2017-08-27 DIAGNOSIS — E611 Iron deficiency: Secondary | ICD-10-CM | POA: Diagnosis present

## 2017-08-27 DIAGNOSIS — A419 Sepsis, unspecified organism: Secondary | ICD-10-CM | POA: Diagnosis present

## 2017-08-27 DIAGNOSIS — J181 Lobar pneumonia, unspecified organism: Secondary | ICD-10-CM | POA: Diagnosis present

## 2017-08-27 DIAGNOSIS — C9010 Plasma cell leukemia not having achieved remission: Secondary | ICD-10-CM | POA: Diagnosis present

## 2017-08-27 DIAGNOSIS — K7689 Other specified diseases of liver: Secondary | ICD-10-CM | POA: Diagnosis present

## 2017-08-27 DIAGNOSIS — D899 Disorder involving the immune mechanism, unspecified: Secondary | ICD-10-CM | POA: Diagnosis present

## 2017-08-27 DIAGNOSIS — E876 Hypokalemia: Secondary | ICD-10-CM | POA: Diagnosis present

## 2017-08-27 DIAGNOSIS — E099 Drug or chemical induced diabetes mellitus without complications: Secondary | ICD-10-CM | POA: Diagnosis present

## 2017-08-27 DIAGNOSIS — Z800 Family history of malignant neoplasm of digestive organs: Secondary | ICD-10-CM | POA: Diagnosis not present

## 2017-08-27 DIAGNOSIS — J189 Pneumonia, unspecified organism: Secondary | ICD-10-CM | POA: Diagnosis present

## 2017-08-27 DIAGNOSIS — B181 Chronic viral hepatitis B without delta-agent: Secondary | ICD-10-CM | POA: Diagnosis present

## 2017-08-27 DIAGNOSIS — D630 Anemia in neoplastic disease: Secondary | ICD-10-CM | POA: Diagnosis present

## 2017-08-27 DIAGNOSIS — K219 Gastro-esophageal reflux disease without esophagitis: Secondary | ICD-10-CM | POA: Diagnosis present

## 2017-08-27 LAB — BASIC METABOLIC PANEL
Anion gap: 7 (ref 5–15)
BUN: 11 mg/dL (ref 6–20)
CHLORIDE: 115 mmol/L — AB (ref 98–111)
CO2: 22 mmol/L (ref 22–32)
CREATININE: 0.87 mg/dL (ref 0.44–1.00)
Calcium: 8.1 mg/dL — ABNORMAL LOW (ref 8.9–10.3)
GFR calc Af Amer: >60 mL/min (ref >60)
GFR calc non Af Amer: >60 mL/min (ref >60)
Glucose, Bld: 68 mg/dL — ABNORMAL LOW (ref 70–99)
Potassium: 4.7 mmol/L (ref 3.5–5.1)
SODIUM: 144 mmol/L (ref 135–145)

## 2017-08-27 LAB — MAGNESIUM: Magnesium: 1.4 mg/dL — ABNORMAL LOW (ref 1.7–2.4)

## 2017-08-27 LAB — STREP PNEUMONIAE URINARY ANTIGEN: Strep Pneumo Urinary Antigen: NEGATIVE

## 2017-08-27 LAB — MRSA PCR SCREENING: MRSA BY PCR: NEGATIVE

## 2017-08-27 MED ORDER — SODIUM CHLORIDE 0.9 % IV BOLUS
500.0000 mL | Freq: Once | INTRAVENOUS | Status: AC
Start: 2017-08-27 — End: 2017-08-27
  Administered 2017-08-27: 500 mL via INTRAVENOUS

## 2017-08-27 MED ORDER — ONDANSETRON HCL 4 MG PO TABS: 4.0000 mg | ORAL_TABLET | Freq: Four times a day (QID) | ORAL | Status: DC | PRN

## 2017-08-27 MED ORDER — TENOFOVIR DISOPROXIL FUMARATE 300 MG PO TABS
300.0000 mg | ORAL_TABLET | Freq: Every day | ORAL | Status: DC
  Administered 2017-08-27 – 2017-08-29 (×3): 300 mg via ORAL
  Filled 2017-08-27 (×3): qty 1

## 2017-08-27 MED ORDER — SODIUM CHLORIDE 0.9 % IV SOLN
1250.0000 mg | INTRAVENOUS | Status: DC
  Filled 2017-08-27: qty 1250

## 2017-08-27 MED ORDER — ACETAMINOPHEN 325 MG PO TABS
650.0000 mg | ORAL_TABLET | Freq: Four times a day (QID) | ORAL | Status: DC | PRN
  Administered 2017-08-27: 650 mg via ORAL
  Filled 2017-08-27: qty 2

## 2017-08-27 MED ORDER — ONDANSETRON HCL 4 MG/2ML IJ SOLN
4.0000 mg | Freq: Four times a day (QID) | INTRAMUSCULAR | Status: DC | PRN
  Administered 2017-08-27: 4 mg via INTRAVENOUS
  Filled 2017-08-27: qty 2

## 2017-08-27 MED ORDER — SODIUM CHLORIDE 0.9 % IV BOLUS
1000.0000 mL | Freq: Once | INTRAVENOUS | Status: AC
  Administered 2017-08-27: 1000 mL via INTRAVENOUS

## 2017-08-27 MED ORDER — SODIUM CHLORIDE 0.9 % IV SOLN
1.0000 g | Freq: Three times a day (TID) | INTRAVENOUS | Status: DC
  Administered 2017-08-27: 1 g via INTRAVENOUS
  Filled 2017-08-27 (×2): qty 1

## 2017-08-27 MED ORDER — ACYCLOVIR 800 MG PO TABS
800.0000 mg | ORAL_TABLET | Freq: Two times a day (BID) | ORAL | Status: DC
  Administered 2017-08-27 – 2017-08-29 (×6): 800 mg via ORAL
  Filled 2017-08-27 (×3): qty 1
  Filled 2017-08-27: qty 2
  Filled 2017-08-27 (×2): qty 1
  Filled 2017-08-27 (×4): qty 2
  Filled 2017-08-27: qty 1
  Filled 2017-08-27: qty 2

## 2017-08-27 MED ORDER — POTASSIUM CHLORIDE CRYS ER 20 MEQ PO TBCR
40.0000 meq | EXTENDED_RELEASE_TABLET | Freq: Once | ORAL | Status: AC
Start: 2017-08-27 — End: 2017-08-27
  Administered 2017-08-27: 40 meq via ORAL
  Filled 2017-08-27: qty 2

## 2017-08-27 MED ORDER — POTASSIUM CHLORIDE CRYS ER 20 MEQ PO TBCR
20.0000 meq | EXTENDED_RELEASE_TABLET | Freq: Every day | ORAL | Status: DC
  Administered 2017-08-27 – 2017-08-29 (×3): 20 meq via ORAL
  Filled 2017-08-27 (×3): qty 1

## 2017-08-27 MED ORDER — ACETAMINOPHEN 650 MG RE SUPP: 650.0000 mg | Freq: Four times a day (QID) | RECTAL | Status: DC | PRN

## 2017-08-27 MED ORDER — ENOXAPARIN SODIUM 40 MG/0.4ML ~~LOC~~ SOLN
40.0000 mg | Freq: Every day | SUBCUTANEOUS | Status: DC
  Administered 2017-08-27 – 2017-08-29 (×3): 40 mg via SUBCUTANEOUS
  Filled 2017-08-27 (×3): qty 0.4

## 2017-08-27 MED ORDER — HYDROCOD POLST-CPM POLST ER 10-8 MG/5ML PO SUER
5.0000 mL | Freq: Two times a day (BID) | ORAL | Status: DC | PRN
  Administered 2017-08-27 – 2017-08-28 (×3): 5 mL via ORAL
  Filled 2017-08-27 (×3): qty 5

## 2017-08-27 MED ORDER — PANTOPRAZOLE SODIUM 40 MG PO TBEC
40.0000 mg | DELAYED_RELEASE_TABLET | Freq: Every day | ORAL | Status: DC
  Administered 2017-08-27 – 2017-08-29 (×3): 40 mg via ORAL
  Filled 2017-08-27 (×3): qty 1

## 2017-08-27 MED ORDER — HYDROCODONE-ACETAMINOPHEN 5-325 MG PO TABS
1.0000 | ORAL_TABLET | ORAL | Status: DC | PRN
  Administered 2017-08-28: 1 via ORAL
  Filled 2017-08-27: qty 1

## 2017-08-27 MED ORDER — SODIUM CHLORIDE 0.9 % IV SOLN
1.0000 g | INTRAVENOUS | Status: DC
  Administered 2017-08-27 – 2017-08-28 (×2): 1 g via INTRAVENOUS
  Filled 2017-08-27: qty 10
  Filled 2017-08-27 (×2): qty 1

## 2017-08-27 MED ORDER — ENSURE ENLIVE PO LIQD
237.0000 mL | Freq: Two times a day (BID) | ORAL | Status: DC
  Administered 2017-08-28 – 2017-08-29 (×3): 237 mL via ORAL

## 2017-08-27 MED ORDER — SODIUM CHLORIDE 0.9 % IV SOLN
INTRAVENOUS | Status: DC
  Administered 2017-08-27 – 2017-08-28 (×4): via INTRAVENOUS

## 2017-08-27 MED ORDER — POMALIDOMIDE 2 MG PO CAPS: 2.0000 mg | ORAL_CAPSULE | Freq: Every day | ORAL | Status: DC

## 2017-08-27 MED ORDER — MAGNESIUM SULFATE 2 GM/50ML IV SOLN
2.0000 g | Freq: Once | INTRAVENOUS | Status: AC
  Administered 2017-08-27: 2 g via INTRAVENOUS
  Filled 2017-08-27: qty 50

## 2017-08-27 MED ORDER — SODIUM CHLORIDE 0.9 % IV SOLN
500.0000 mg | Freq: Every day | INTRAVENOUS | Status: DC
  Administered 2017-08-27 – 2017-08-28 (×3): 500 mg via INTRAVENOUS
  Filled 2017-08-27 (×4): qty 500

## 2017-08-27 MED ORDER — POLYETHYLENE GLYCOL 3350 17 G PO PACK: 17.0000 g | PACK | Freq: Every day | ORAL | Status: DC | PRN

## 2017-08-27 NOTE — Plan of Care (Signed)
Patient stable during 7 a to 7p shift, T max 101, reduced after tylenol.  Patient eating foods brought in by family members and tolerating well.  Cough much improved after addition of Tussionex.  Multiple family members in this shift.

## 2017-08-27 NOTE — H&P (Addendum)
History and Physical    Sheryl [name] SEG:[id number] DOB: 03-24-1970 DOA: 08/26/2017  PCP: [name], MD   Patient coming from: Home   Chief Complaint: Fever, Abdominal pain, cough  HPI: Sheryl [name] is a 47 y.o. 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 Patient is [place] speaking, speaks little English able to answer simple questions, daughter present at bedside helped with some interpretation. Patient presented to the Ed with c/o abdominal pain of one day duration, diffuse, denies dysuria.  Patient vomited 5 times today, non-bloody.  No loose stools.   Patient reports cough ~ 2weeks duration, dry, but has progressively worsened over the past few days.  Denies shortness of breath.  Also reports fevers intermittent since yesterday. Denies neck pain or stiffness.   ED Course: Febrile- 103.1, tachycardia- 108, tachypnea to 27, O2 sats greater than 96% on room air.  WBC- 9.  Lactic acid- 1.25, lipase- 23.  UA-trace leukocytes rare bacteria.  Two-view chest x-ray -patchy right middle lobe opacity may reflect atelectasis or mild infiltrate. CT abd & pelvis W contrast-focal consolidation inferior right midlung likely represent pneumonia.  2L bolus given normal saline . Patient started on IV Vanco and Zosyn in the ED hospitalist called to admit for pneumonia.  Review of Systems: As per HPI otherwise all other systems reviewed and negative.   Past Medical History:  Diagnosis Date  . Chills with fever    intermittently since d/c from hospital  . Dysuria-frequency syndrome    w/ pink urine  . GERD (gastroesophageal reflux disease)   . Hepatitis   . History of positive PPD    DX 2011--  CXR DONE NO EVIDENCE  . History of ureter stent   . Hydronephrosis, right   . Neuromuscular disorder (HCC)    legs numb intermittently  . 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[place])   . Pneumonia   . Right ureteral stone   . Urosepsis 8/14   admitted to  [facility]    Past Surgical History:  Procedure Laterality Date  . CYSTOSCOPY W/ URETERAL STENT PLACEMENT Right 09/25/2012   Procedure: CYSTOSCOPY WITH RETROGRADE PYELOGRAM/URETERAL STENT PLACEMENT;  Surgeon: Alexis Frock, MD;  Location: [facility];  Service: Urology;  Laterality: Right;  . CYSTOSCOPY WITH RETROGRADE PYELOGRAM, URETEROSCOPY AND STENT PLACEMENT Right 10/15/2012   Procedure: CYSTOSCOPY WITH RETROGRADE PYELOGRAM, URETEROSCOPY AND REMOVAL STENT WITH  STENT PLACEMENT;  Surgeon: Alexis Frock, MD;  Location: [facility];  Service: Urology;  Laterality: Right;  . ESOPHAGOGASTRODUODENOSCOPY (EGD) WITH PROPOFOL N/A 11/16/2014   Procedure: ESOPHAGOGASTRODUODENOSCOPY (EGD) WITH PROPOFOL;  Surgeon: Milus Banister, MD;  Location: [facility];  Service: Endoscopy;  Laterality: N/A;  . HOLMIUM LASER APPLICATION Right 7/37/1062   Procedure: HOLMIUM LASER APPLICATION;  Surgeon: Alexis Frock, MD;  Location[facility];  Service: Urology;  Laterality: Right;  . LIVER BIOPSY    . OTHER SURGICAL HISTORY Right    removal of ovarian cyst  . removal of uterine cyst     years ago  . RIGHT VATS W/ DRAINAGE PEURAL EFFUSION AND BX'S  10-30-2008     reports that she has never smoked. She has never used smokeless tobacco. She reports that she does not drink alcohol or use drugs.  No Known Allergies  Family History  Problem Relation Age of Onset  . Stomach cancer Mother   . Lung disease Father   . Asthma Father     Prior to Admission medications  Medication Sig Start Date End Date Taking? Authorizing Provider  acyclovir (ZOVIRAX) 800 MG tablet Take 1 tablet (800 mg total) by mouth 2 (two) times daily. 07/06/17  Yes Truitt Merle, MD  feeding supplement, ENSURE ENLIVE, (ENSURE ENLIVE) LIQD Take 237 mLs by mouth 2 (two) times daily between meals. 02/22/17  Yes Elodia Florence., MD  ibuprofen (ADVIL,MOTRIN) 200 MG tablet Take 200 mg by mouth every 6 (six) hours as needed  for moderate pain.   Yes [provider]  omeprazole (PRILOSEC) 20 MG capsule Take 1 capsule (20 mg total) by mouth daily. 06/08/17  Yes Truitt Merle, MD  ondansetron (ZOFRAN) 8 MG tablet Take 1 tablet (8 mg total) by mouth every 8 (eight) hours as needed for nausea or vomiting. 02/18/16  Yes Truitt Merle, MD  pomalidomide (POMALYST) 2 MG capsule Take 1 capsule (2 mg total) by mouth daily. Take with water on days 1-21. Repeat every 28 days. 08/07/17  Yes Truitt Merle, MD  potassium chloride SA (K-DUR,KLOR-CON) 20 MEQ tablet Take 1 tablet (20 mEq total) by mouth daily. 04/14/17  Yes Alla Feeling, NP  VIREAD 300 MG tablet Take 1 tablet (300 mg total) by mouth daily. 06/24/17  Yes Carlyle Basques, MD  magic mouthwash SOLN Take 5 mLs by mouth 4 (four) times daily as needed for mouth pain. 06/23/17   Tanner, Lyndon Code., PA-C  polyethylene glycol (MIRALAX / GLYCOLAX) packet Take 17 g by mouth daily as needed (constipation).     [provider]  prochlorperazine (COMPAZINE) 10 MG tablet Take 1 tablet (10 mg total) by mouth every 6 (six) hours as needed for nausea or vomiting. 08/17/17   Alla Feeling, NP  promethazine (PHENERGAN) 25 MG tablet Take 1 tablet (25 mg total) by mouth every 6 (six) hours as needed for nausea or vomiting. Patient not taking: Reported on 08/26/2017 05/16/16   Robbie Lis, MD  ursodiol (ACTIGALL) 300 MG capsule Take 1 capsule (300 mg total) by mouth 2 (two) times daily. Patient not taking: Reported on 08/26/2017 04/13/17   Alla Feeling, NP    Physical Exam: Vitals:   08/26/17 2155 08/26/17 2230 08/26/17 2324 08/27/17 0004  BP: 110/69 109/63 107/64 (!) 101/55  Pulse: 98 (!) 102 100 92  Resp: 20 (!) 27 19 (!) 24  Temp:      TempSrc:      SpO2: 96% 97% 100% 97%  Weight:      Height:        Constitutional:  Lethargic, calm, comfortable Vitals:   08/26/17 2155 08/26/17 2230 08/26/17 2324 08/27/17 0004  BP: 110/69 109/63 107/64 (!) 101/55  Pulse: 98 (!) 102 100 92    Resp: 20 (!) 27 19 (!) 24  Temp:      TempSrc:      SpO2: 96% 97% 100% 97%  Weight:      Height:       Eyes: PERRL, lids and conjunctivae normal ENMT: Mucous membranes are moist. Posterior pharynx clear of any exudate or lesions.  Neck: normal, neck supple, no masses, no thyromegaly Respiratory: On room air, clear to auscultation bilaterally, no wheezing, no crackles.  Reduced breath sounds right lower lungs posteriorly. cardiovascular: Regular rate and rhythm, no murmurs / rubs / gallops. No extremity edema. 2+ pedal pulses. No carotid bruits.  Abdomen: no tenderness, no masses palpated. No hepatosplenomegaly. Bowel sounds positive.  Musculoskeletal: no clubbing / cyanosis. No joint deformity upper and lower extremities. Good ROM, no contractures.  Normal muscle tone.  Skin: no rashes, lesions, ulcers. No induration Neurologic: CN 2-12 grossly intact.  Strength 5/5 in all 4.  Psychiatric: Normal judgment and insight. Lethargic, but oriented x 3. Normal mood.   Labs on Admission: I have personally reviewed following labs and imaging studies  CBC: Recent Labs  Lab 08/26/17 2130  WBC 9.0  NEUTROABS 6.0  HGB 10.0*  HCT 32.2*  MCV 87.3  PLT 010   Basic Metabolic Panel: Recent Labs  Lab 08/26/17 2130  NA 146*  K 3.4*  CL 108  CO2 27  GLUCOSE 85  BUN 12  CREATININE 0.75  CALCIUM 9.4   Liver Function Tests: Recent Labs  Lab 08/26/17 2130  AST 22  ALT 8  ALKPHOS 58  BILITOT 0.8  PROT 6.7  ALBUMIN 3.3*   Recent Labs  Lab 08/26/17 2131  LIPASE 23   Coagulation Profile: Recent Labs  Lab 08/26/17 2130  INR 1.08   Urine analysis:    Component Value Date/Time   COLORURINE YELLOW 08/26/2017 2054   APPEARANCEUR CLEAR 08/26/2017 2054   LABSPEC 1.011 08/26/2017 2054   LABSPEC 1.010 04/09/2016 1356   PHURINE 8.0 08/26/2017 2054   GLUCOSEU NEGATIVE 08/26/2017 2054   GLUCOSEU Negative 04/09/2016 [place] 08/26/2017 2054   BILIRUBINUR NEGATIVE  08/26/2017 2054   BILIRUBINUR Negative 04/09/2016 1356   [place] 08/26/2017 2054   PROTEINUR NEGATIVE 08/26/2017 2054   UROBILINOGEN 0.2 04/09/2016 1356   NITRITE NEGATIVE 08/26/2017 2054   LEUKOCYTESUR TRACE (A) 08/26/2017 2054   LEUKOCYTESUR Moderate 04/09/2016 1356    Radiological Exams on Admission: Dg Chest 2 View  Result Date: 08/26/2017 CLINICAL DATA:  Fever EXAM: CHEST - 2 VIEW COMPARISON:  06/08/2017, 04/06/2017 FINDINGS: Right-sided central venous port tip over the cavoatrial region. Patchy right middle lobe opacity. Stable borderline cardiomegaly. No pneumothorax. IMPRESSION: Patchy right middle lobe opacity may reflect atelectasis or a mild infiltrate. Electronically Signed   By: Donavan Foil M.D.   On: 08/26/2017 21:17   Ct Abdomen Pelvis W Contrast  Result Date: 08/26/2017 CLINICAL DATA:  Abdominal pain and fever starting yesterday. Vomiting. Currently undergoing chemotherapy for leukemia. EXAM: CT ABDOMEN AND PELVIS WITH CONTRAST TECHNIQUE: Multidetector CT imaging of the abdomen and pelvis was performed using the standard protocol following bolus administration of intravenous contrast. CONTRAST:  137mL ISOVUE-300 IOPAMIDOL (ISOVUE-300) INJECTION 61% COMPARISON:  05/20/2015 FINDINGS: Lower chest: Motion artifact limits examination. There is hazy parenchymal opacity diffusely throughout the lung bases which may indicate edema or pneumonia. There is focal consolidation in the inferior right middle lung likely due to pneumonia. Mild cardiac enlargement. Hepatobiliary: Cyst in the right lobe of the liver measuring 12 mm diameter. No change since prior study. Gallbladder and bile ducts are unremarkable. Pancreas: Unremarkable. No pancreatic ductal dilatation or surrounding inflammatory changes. Spleen: There is a multiloculated cystic mass lateral and posterior to the spleen, possibly involving the splenic capsule. The lesion measures up to about 2 x 7.4 cm in diameter. This is  similar in appearance to previous study without significant progression. Adrenals/Urinary Tract: No adrenal gland nodules. Subcentimeter renal cysts bilaterally. 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 No hydronephrosis or hydroureter. No ureteral stones. Bladder wall is thickened possibly indicating cystitis. No bladder filling defects. Stomach/Bowel: Stomach, small bowel, and colon are not abnormally distended. Stool diffusely fills the colon. No wall thickening or inflammatory changes. The appendix is not identified.  Vascular/Lymphatic: No significant vascular findings are present. No enlarged abdominal or pelvic lymph nodes. Reproductive: Nodular enlargement of the uterus likely representing fibroids. No abnormal adnexal masses. Other: No free air or free fluid in the abdomen. Abdominal wall musculature appears intact. Musculoskeletal: No acute or significant osseous findings. Calcifications in the subcutaneous fat over the gluteal regions likely representing injection granulomas. IMPRESSION: 1. Patchy parenchymal opacity in the lung bases may indicate edema or pneumonia. Focal consolidation in the inferior right middle lung likely represents pneumonia. 2. No evidence of bowel obstruction or inflammation. Bladder wall thickening may indicate cystitis. 3. 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 This is similar to previous study without progression. 4. 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 No change. 5. Uterine fibroids. Electronically Signed   By: Lucienne Capers M.D.   On: 08/26/2017 23:52    EKG: Independently reviewed.  EKG shows nonspecific T wave changes aVL only.  QTc 459.   Assessment/Plan Active Problems:   Abdominal pain   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HCC)   Pneumonia  Pneumonia- cough, fevers, Tachycardia, tachypnea. WBC- 9 (both elevated compared to her baseline 3-4).   Lactic acid - 1.25. Chest xray and CT abd -consolidation inferior right middle long likely represents pneumonia.  Patient immunocompromised meet SIRS criteria. 2L bolus given in ED. -Will continue broad-spectrum antibiotics IV Vanco, IV cefepime instead of Zosyn, considering immunocompromise status and severity of illness. 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 follows with Dr. Burr Medico. Last chemo 08/17/17.  -Would Recommend oncology consult in a.m. -At this time will continue patient's PO chemo  Hypokalemia- Mild- K- 3.4.  - Replete K, then cont home Kdur 51meq daily - Check MAg - BMP a.m  Hepatitis B-currently on acyclovir and Vired ( Tenofovir). F/u Dr. Baxter Flattery.  Liver enzymes stable.  Abdominal pain- ?  Relation acute illness pneumonia or history of hepatitis B. Lipase and liver enzymes unremarkable. UA-trace leuks, 21- 30 WBC, rare bacteria. -Follow-up urine cultures  Chronic anemia- Hgb 10. With increased RDW. Baseline 9-10.  On chemo and has leukemia. ? iron deficiency. -Follow-up outpatient.   HIV as part of routine health screening  DVT prophylaxis: Lovenox Code Status: full Family Communication: Daughter Goi present at bedside Disposition Plan: Per rounding team Consults called: Would recommend oncology consult a.m. Admission status: Inpt, tele   Bethena Roys MD [facility] Pager [phone] From 6PM-2AM.  Otherwise please contact night-coverage [email or web address] Password [facility]  08/27/2017, 12:25 AM

## 2017-08-27 NOTE — Progress Notes (Addendum)
Admission from ED, pt presented in ED with temp of 103F, abdominal pain, and vomiting. Pt admitted with pneumonia dx. Pt primary language is [place], used Oceanographer for admission. Currently receiving chemo (July 15th last) for leukemia, has chest port, accessed.  Pt afebrile at this time, BP low, received order for bolus and fluids. On contact precaution for ESBL. Pt positive for Hep B (chronic). Home meds collected and sent to pharmacy. Pt tolerating IV fluids/anx well.

## 2017-08-27 NOTE — Progress Notes (Signed)
PROGRESS NOTE    Sheryl [name]  CNO:[id number] DOB: 1970/05/03 DOA: 08/26/2017 PCP: Sheryl Junior, MD   Brief Narrative: Sheryl [name] is a 47 y.o. female with a history of hepatitis B and plasma cell leukemia currently on chemotherapy. She presents secondary to RUQ abdominal pain found to have a right lower lobe pneumonia. She was initially started on vancomycin/cefepime/azithromycin but is now on ceftriaxone and azithromycin. Blood/urine cultures obtained and are pending.   Assessment & Plan:   Active Problems:   Abdominal pain   Chronic hepatitis B without delta agent without hepatic coma (HCC)   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HCC)   Pneumonia   Abdominal pain Likely pleuritic pain. CT abdomen/pelvis significant for a 12 mm hepatic cyst that is unchanged.  -Urine culture obtained and is pending  Right lower lobe pneumonia Community acquired. MRSA PCR negative. Initially started vancomycin, cefepime and azithromycin. Febrile this morning. -Discontinue Vancomycin/cefepime -Start ceftriaxone and continue azithromycin -Sputum culture -Blood culture pending -HIV pending  Plasma cell leukemia Currently on chemotherapy as an outpatient by Dr. Burr Medico. Last treatment 7/15. Pomalyst held on admission secondary to active infection. -Oncology added to care team  Hypokalemia Given supplementation. Patient takes potassium chronically as an outpatient  Hepatitis B Follows with infectious disease outpatient. -Continue acyclovir and tenofovir  Chronic anemia Normocytic. Likely chronic disease. Stable.   DVT prophylaxis: Lovenox Code Status:   Code Status: Full Code Family Communication: None at bedside Disposition Plan: Discharge likely in 48-72 hours   Consultants:   None  Procedures:   None  Antimicrobials:  Vancomycin (7/24)  Cefepime (7/24)  Azithromycin (7/24>>  Ceftriaxone (7/25>>  Tenofavir (7/24>>  Acyclovir (7/24>>   Subjective: Right upper quadrant abdominal  pain. Cough. Pain is worse with cough, otherwise it is tolerable.  Objective: Vitals:   08/27/17 0210 08/27/17 0235 08/27/17 0328 08/27/17 0340  BP: (!) 85/57  (!) 87/64   Pulse: 73  69   Resp: 18     Temp: 98 F (36.7 C)   98 F (36.7 C)  TempSrc: Oral   Oral  SpO2: 98%  98%   Weight:  62.3 kg (137 lb 5.6 oz)    Height:  5' 2.99" (1.6 m)      Intake/Output Summary (Last 24 hours) at 08/27/2017 0926 Last data filed at 08/27/2017 2947 Gross per 24 hour  Intake 3102.7 ml  Output 1300 ml  Net 1802.7 ml   Filed Weights   08/26/17 2034 08/27/17 0235  Weight: 59 kg (130 lb) 62.3 kg (137 lb 5.6 oz)    Examination:  General exam: Appears calm and comfortable Respiratory system: Diminished breath sounds Respiratory effort normal. Cardiovascular system: S1 & S2 heard, RRR. No murmurs, rubs, gallops or clicks. Gastrointestinal system: Abdomen is nondistended, soft and tender in RUQ. No organomegaly or masses felt. Normal bowel sounds heard. Central nervous system: Alert and oriented. No focal neurological deficits. Extremities: No edema. No calf tenderness Skin: No cyanosis. No rashes Psychiatry: Judgement and insight appear normal. Mood & affect appropriate.     Data Reviewed: I have personally reviewed following labs and imaging studies  CBC: Recent Labs  Lab 08/26/17 2130  WBC 9.0  NEUTROABS 6.0  HGB 10.0*  HCT 32.2*  MCV 87.3  PLT 654   Basic Metabolic Panel: Recent Labs  Lab 08/26/17 2130 08/26/17 2249 08/27/17 0603  NA 146*  --  144  K 3.4*  --  4.7  CL 108  --  115*  CO2 27  --  22  GLUCOSE 85  --  68*  BUN 12  --  11  CREATININE 0.75  --  0.87  CALCIUM 9.4  --  8.1*  MG  --  1.4*  --    GFR: Estimated Creatinine Clearance: 64.7 mL/min (by C-G formula based on SCr of 0.87 mg/dL). Liver Function Tests: Recent Labs  Lab 08/26/17 2130  AST 22  ALT 8  ALKPHOS 58  BILITOT 0.8  PROT 6.7  ALBUMIN 3.3*   Recent Labs  Lab 08/26/17 2131  LIPASE  23   No results for input(s): AMMONIA in the last 168 hours. Coagulation Profile: Recent Labs  Lab 08/26/17 2130  INR 1.08   Cardiac Enzymes: No results for input(s): CKTOTAL, CKMB, CKMBINDEX, TROPONINI in the last 168 hours. BNP (last 3 results) No results for input(s): PROBNP in the last 8760 hours. HbA1C: No results for input(s): HGBA1C in the last 72 hours. CBG: No results for input(s): GLUCAP in the last 168 hours. Lipid Profile: No results for input(s): CHOL, HDL, LDLCALC, TRIG, CHOLHDL, LDLDIRECT in the last 72 hours. Thyroid Function Tests: No results for input(s): TSH, T4TOTAL, FREET4, T3FREE, THYROIDAB in the last 72 hours. Anemia Panel: No results for input(s): VITAMINB12, FOLATE, FERRITIN, TIBC, IRON, RETICCTPCT in the last 72 hours. Sepsis Labs: Recent Labs  Lab 08/26/17 2143  LATICACIDVEN 1.25    No results found for this or any previous visit (from the past 240 hour(s)).       Radiology Studies: Dg Chest 2 View  Result Date: 08/26/2017 CLINICAL DATA:  Fever EXAM: CHEST - 2 VIEW COMPARISON:  06/08/2017, 04/06/2017 FINDINGS: Right-sided central venous port tip over the cavoatrial region. Patchy right middle lobe opacity. Stable borderline cardiomegaly. No pneumothorax. IMPRESSION: Patchy right middle lobe opacity may reflect atelectasis or a mild infiltrate. Electronically Signed   By: Donavan Foil M.D.   On: 08/26/2017 21:17   Ct Abdomen Pelvis W Contrast  Result Date: 08/26/2017 CLINICAL DATA:  Abdominal pain and fever starting yesterday. Vomiting. Currently undergoing chemotherapy for leukemia. EXAM: CT ABDOMEN AND PELVIS WITH CONTRAST TECHNIQUE: Multidetector CT imaging of the abdomen and pelvis was performed using the standard protocol following bolus administration of intravenous contrast. CONTRAST:  149mL ISOVUE-300 IOPAMIDOL (ISOVUE-300) INJECTION 61% COMPARISON:  05/20/2015 FINDINGS: Lower chest: Motion artifact limits examination. There is hazy  parenchymal opacity diffusely throughout the lung bases which may indicate edema or pneumonia. There is focal consolidation in the inferior right middle lung likely due to pneumonia. Mild cardiac enlargement. Hepatobiliary: Cyst in the right lobe of the liver measuring 12 mm diameter. No change since prior study. Gallbladder and bile ducts are unremarkable. Pancreas: Unremarkable. No pancreatic ductal dilatation or surrounding inflammatory changes. Spleen: There is a multiloculated cystic mass lateral and posterior to the spleen, possibly involving the splenic capsule. The lesion measures up to about 2 x 7.4 cm in diameter. This is similar in appearance to previous study without significant progression. Adrenals/Urinary Tract: No adrenal gland nodules. Subcentimeter renal cysts bilaterally. 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 No hydronephrosis or hydroureter. No ureteral stones. Bladder wall is thickened possibly indicating cystitis. No bladder filling defects. Stomach/Bowel: Stomach, small bowel, and colon are not abnormally distended. Stool diffusely fills the colon. No wall thickening or inflammatory changes. The appendix is not identified. Vascular/Lymphatic: No significant vascular findings are present. No enlarged abdominal or pelvic lymph nodes. Reproductive: Nodular enlargement of the uterus likely  representing fibroids. No abnormal adnexal masses. Other: No free air or free fluid in the abdomen. Abdominal wall musculature appears intact. Musculoskeletal: No acute or significant osseous findings. Calcifications in the subcutaneous fat over the gluteal regions likely representing injection granulomas. IMPRESSION: 1. Patchy parenchymal opacity in the lung bases may indicate edema or pneumonia. Focal consolidation in the inferior right middle lung likely represents pneumonia. 2. No evidence of bowel obstruction or inflammation. Bladder  wall thickening may indicate cystitis. 3. 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 This is similar to previous study without progression. 4. 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 No change. 5. Uterine fibroids. Electronically Signed   By: Lucienne Capers M.D.   On: 08/26/2017 23:52        Scheduled Meds: . acyclovir  800 mg Oral BID  . enoxaparin (LOVENOX) injection  40 mg Subcutaneous Daily  . feeding supplement (ENSURE ENLIVE)  237 mL Oral BID BM  . pantoprazole  40 mg Oral Daily  . potassium chloride SA  20 mEq Oral Daily  . tenofovir  300 mg Oral Daily   Continuous Infusions: . sodium chloride 75 mL/hr at 08/27/17 0618  . azithromycin Stopped (08/27/17 0442)  . ceFEPime (MAXIPIME) IV Stopped (08/27/17 0555)  . magnesium sulfate 1 - 4 g bolus IVPB    . vancomycin       LOS: 0 days     Cordelia Poche, MD [facility] 08/27/2017, 9:26 AM Pager: [phone]  If 7PM-7AM, please contact night-coverage [email or web address] 08/27/2017, 9:26 AM

## 2017-08-27 NOTE — Progress Notes (Signed)
Pharmacy Antibiotic Note  Sheryl [name] is a 47 y.o. female with hx of leukemia c/o abdominal pain admitted on 08/26/2017 with pneumonia.  Pharmacy has been consulted for cefepime and vancomycin dosing.  Plan: Cefepime 1 gm IV q8h Vancomycin 1 gm x1 then 1250 mg IV q24h for est AUC = 488 Goal AUC = 400-500 F/u scr/cultures/levels  Height: 5\' 3"  (160 cm) Weight: 130 lb (59 kg) IBW/kg (Calculated) : 52.4  Temp (24hrs), Avg:101.5 F (38.6 C), Min:100.7 F (38.2 C), Max:103.1 F (39.5 C)  Recent Labs  Lab 08/26/17 2130 08/26/17 2143  WBC 9.0  --   CREATININE 0.75  --   LATICACIDVEN  --  1.25    Estimated Creatinine Clearance: 70.4 mL/min (by C-G formula based on SCr of 0.75 mg/dL).    No Known Allergies  Antimicrobials this admission: 7/24 zosyn >> x1 ED 7/24 vancomycin >>  7/25 cefepime >> 7/25 zmax >>  Dose adjustments this admission:   Microbiology results:  BCx:   UCx:    Sputum:    MRSA PCR:   Thank you for allowing pharmacy to be a part of this patient's care.  Dorrene German 08/27/2017 1:12 AM

## 2017-08-27 NOTE — ED Notes (Signed)
. ED TO INPATIENT HANDOFF REPORT  Name/Age/Gender Sheryl [name] 47 y.o. female  Code Status Code Status History    Date Active Date Inactive Code Status Order ID Comments User Context   02/16/2017 1555 02/21/2017 2039 Full Code [id number]  Janece Canterbury, MD Inpatient   05/11/2016 1551 05/16/2016 1600 Full Code [id number]  Debbe Odea, MD ED   05/11/2016 1437 05/11/2016 1551 Full Code [id number]  Debbe Odea, MD ED   05/20/2015 2123 05/24/2015 1759 Full Code [id number]  Gennaro Africa, MD ED   09/14/2014 2227 09/17/2014 1717 Full Code [id number]  Ivor Costa, MD ED      Home/SNF/Other Home  Chief Complaint abdominal pain   Level of Care/Admitting Diagnosis ED Disposition    ED Disposition Condition [facility] Area: [facility] [[id number]]  Level of Care: Telemetry [5]  Admit to tele based on following criteria: Complex arrhythmia (Bradycardia/Tachycardia)  Diagnosis: Pneumonia [330076]  Admitting Physician: Kara Pacer  Attending Physician: Bethena Roys [phone]  Estimated length of stay: past midnight tomorrow  Certification:: I certify this patient will need inpatient services for at least 2 midnights  PT Class (Do Not Modify): Inpatient [101]  PT Acc Code (Do Not Modify): Private [1]       Medical History Past Medical History:  Diagnosis Date  . Chills with fever    intermittently since d/c from hospital  . Dysuria-frequency syndrome    w/ pink urine  . GERD (gastroesophageal reflux disease)   . Hepatitis   . History of positive PPD    DX 2011--  CXR DONE NO EVIDENCE  . History of ureter stent   . Hydronephrosis, right   . Neuromuscular disorder (HCC)    legs numb intermittently  . 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 Pneumonia   . Right ureteral stone   . Urosepsis 8/14   admitted to [facility]    Allergies No Known Allergies  IV Location/Drains/Wounds Patient Lines/Drains/Airways Status   Active Line/Drains/Airways    Name:   Placement date:   Placement time:   Site:   Days:   Implanted Port 10/19/14 Right Chest   10/19/14    -    Chest   1043   Ureteral Drain/Stent Right ureter 6 Fr.   10/15/12    1245    Right ureter   1777   Incision 09/25/12 Perineum   09/25/12    1415     1797   Incision 10/15/12 Perineum Other (Comment)   10/15/12    1240     1777          Labs/Imaging Results for orders placed or performed during the hospital encounter of 08/26/17 (from the past 48 hour(s))  Urinalysis, Routine w reflex microscopic     Status: Abnormal   Collection Time: 08/26/17  8:54 PM  Result Value Ref Range   Color, Urine YELLOW YELLOW   APPearance CLEAR CLEAR   Specific Gravity, Urine 1.011 1.005 - 1.030   pH 8.0 5.0 - 8.0   Glucose, UA NEGATIVE NEGATIVE mg/dL   Hgb urine dipstick NEGATIVE NEGATIVE   Bilirubin Urine NEGATIVE NEGATIVE   Ketones, ur NEGATIVE NEGATIVE mg/dL   Protein, ur NEGATIVE NEGATIVE mg/dL   Nitrite NEGATIVE NEGATIVE   Leukocytes, UA TRACE (A) NEGATIVE   RBC / HPF 0-5 0 - 5 RBC/hpf   WBC, UA 21-50 0 - 5 WBC/hpf   Bacteria, UA RARE (A) NONE SEEN   Squamous Epithelial /  LPF 0-5 0 - 5    Comment: Performed at [facility], [place] [address], [place], [place] [address]  Pregnancy, urine     Status: None   Collection Time: 08/26/17  8:54 PM  Result Value Ref Range   Preg Test, Ur NEGATIVE NEGATIVE    Comment:        THE SENSITIVITY OF THIS METHODOLOGY IS >20 mIU/mL. Performed at [facility], [place] [address] [place], [place] [address]   Comprehensive metabolic panel     Status: Abnormal   Collection Time: 08/26/17  9:30 PM  Result Value Ref Range   Sodium 146 (H) 135 - 145 mmol/L   Potassium 3.4 (L) 3.5 - 5.1 mmol/L   Chloride 108 98 - 111 mmol/L   CO2 27 22 - 32 mmol/L   Glucose, Bld 85 70 - 99 mg/dL   BUN 12 6 - 20 mg/dL   Creatinine, Ser 0.75 0.44 - 1.00 mg/dL   Calcium 9.4 8.9 - 10.3 mg/dL   Total Protein 6.7 6.5 - 8.1 g/dL    Albumin 3.3 (L) 3.5 - 5.0 g/dL   AST 22 15 - 41 U/L   ALT 8 0 - 44 U/L   Alkaline Phosphatase 58 38 - 126 U/L   Total Bilirubin 0.8 0.3 - 1.2 mg/dL   GFR calc non Af Amer >60 >60 mL/min   GFR calc Af Amer >60 >60 mL/min    Comment: (NOTE) The eGFR has been calculated using the CKD EPI equation. This calculation has not been validated in all clinical situations. eGFR's persistently <60 mL/min signify possible Chronic Kidney Disease.    Anion gap 11 5 - 15    Comment: Performed at [facility], [place] [address] [place], [place] [address]  CBC with Differential     Status: Abnormal   Collection Time: 08/26/17  9:30 PM  Result Value Ref Range   WBC 9.0 4.0 - 10.5 K/uL   RBC 3.69 (L) 3.87 - 5.11 MIL/uL   Hemoglobin 10.0 (L) 12.0 - 15.0 g/dL   HCT 32.2 (L) 36.0 - 46.0 %   MCV 87.3 78.0 - 100.0 fL   MCH 27.1 26.0 - 34.0 pg   MCHC 31.1 30.0 - 36.0 g/dL   RDW 16.4 (H) 11.5 - 15.5 %   Platelets 150 150 - 400 K/uL   Neutrophils Relative % 66 %   Neutro Abs 6.0 1.7 - 7.7 K/uL   Lymphocytes Relative 17 %   Lymphs Abs 1.5 0.7 - 4.0 K/uL   Monocytes Relative 17 %   Monocytes Absolute 1.5 (H) 0.1 - 1.0 K/uL   Eosinophils Relative 0 %   Eosinophils Absolute 0.0 0.0 - 0.7 K/uL   Basophils Relative 0 %   Basophils Absolute 0.0 0.0 - 0.1 K/uL    Comment: Performed at [facility], [place] [address], [place], [place] [address]  Protime-INR     Status: None   Collection Time: 08/26/17  9:30 PM  Result Value Ref Range   Prothrombin Time 13.9 11.4 - 15.2 seconds   INR 1.08     Comment: Performed at [facility], [place] [address], [place], [place] [address]  Lipase, blood     Status: None   Collection Time: 08/26/17  9:31 PM  Result Value Ref Range   Lipase 23 11 - 51 U/L    Comment: Performed at [facility], [place] [address], [place], [place] [address]  I-Stat beta hCG blood, ED     Status: Abnormal   Collection Time:  08/26/17  9:41 PM  Result Value Ref Range   I-stat hCG, quantitative 5.3 (H) <5 mIU/mL   Comment 3            Comment:   GEST. AGE      CONC.  (mIU/mL)   <=1 WEEK        5 - 50     2 WEEKS       50 - 500     3 WEEKS       100 - 10,000     4 WEEKS     1,000 - 30,000        FEMALE AND NON-PREGNANT FEMALE:     LESS THAN 5 mIU/mL   I-Stat CG4 Lactic Acid, ED     Status: None   Collection Time: 08/26/17  9:43 PM  Result Value Ref Range   Lactic Acid, Venous 1.25 0.5 - 1.9 mmol/L   Dg Chest 2 View  Result Date: 08/26/2017 CLINICAL DATA:  Fever EXAM: CHEST - 2 VIEW COMPARISON:  06/08/2017, 04/06/2017 FINDINGS: Right-sided central venous port tip over the cavoatrial region. Patchy right middle lobe opacity. Stable borderline cardiomegaly. No pneumothorax. IMPRESSION: Patchy right middle lobe opacity may reflect atelectasis or a mild infiltrate. Electronically Signed   By: Donavan Foil M.D.   On: 08/26/2017 21:17   Ct Abdomen Pelvis W Contrast  Result Date: 08/26/2017 CLINICAL DATA:  Abdominal pain and fever starting yesterday. Vomiting. Currently undergoing chemotherapy for leukemia. EXAM: CT ABDOMEN AND PELVIS WITH CONTRAST TECHNIQUE: Multidetector CT imaging of the abdomen and pelvis was performed using the standard protocol following bolus administration of intravenous contrast. CONTRAST:  174m ISOVUE-300 IOPAMIDOL (ISOVUE-300) INJECTION 61% COMPARISON:  05/20/2015 FINDINGS: Lower chest: Motion artifact limits examination. There is hazy parenchymal opacity diffusely throughout the lung bases which may indicate edema or pneumonia. There is focal consolidation in the inferior right middle lung likely due to pneumonia. Mild cardiac enlargement. Hepatobiliary: Cyst in the right lobe of the liver measuring 12 mm diameter. No change since prior study. Gallbladder and bile ducts are unremarkable. Pancreas: Unremarkable. No pancreatic ductal dilatation or surrounding inflammatory changes. Spleen: There is  a multiloculated cystic mass lateral and posterior to the spleen, possibly involving the splenic capsule. The lesion measures up to about 2 x 7.4 cm in diameter. This is similar in appearance to previous study without significant progression. Adrenals/Urinary Tract: No adrenal gland nodules. Subcentimeter renal cysts bilaterally. 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 No hydronephrosis or hydroureter. No ureteral stones. Bladder wall is thickened possibly indicating cystitis. No bladder filling defects. Stomach/Bowel: Stomach, small bowel, and colon are not abnormally distended. Stool diffusely fills the colon. No wall thickening or inflammatory changes. The appendix is not identified. Vascular/Lymphatic: No significant vascular findings are present. No enlarged abdominal or pelvic lymph nodes. Reproductive: Nodular enlargement of the uterus likely representing fibroids. No abnormal adnexal masses. Other: No free air or free fluid in the abdomen. Abdominal wall musculature appears intact. Musculoskeletal: No acute or significant osseous findings. Calcifications in the subcutaneous fat over the gluteal regions likely representing injection granulomas. IMPRESSION: 1. Patchy parenchymal opacity in the lung bases may indicate edema or pneumonia. Focal consolidation in the inferior right middle lung likely represents pneumonia. 2. No evidence of bowel obstruction or inflammation. Bladder wall thickening may indicate cystitis. 3.  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 This is similar to previous study without progression. 4. 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 No change. 5. Uterine fibroids. Electronically Signed   By: Lucienne Capers M.D.   On: 08/26/2017 23:52    Pending Labs Unresulted Labs (From admission, onward)   Start     Ordered   08/27/17 0108  Magnesium  Add-on,   R      08/27/17 0107   08/27/17 0102  Strep pneumoniae urinary antigen  Once,   R     08/27/17 0101   08/27/17 0102  Legionella Pneumophila Serogp 1 Ur Ag  Once,   R     08/27/17 0101   08/27/17 0031  Culture, Urine  Add-on,   R     08/27/17 0030   08/26/17 2035  Culture, blood (Routine x 2)  BLOOD CULTURE X 2,   STAT     08/26/17 2035   Signed and Held  HIV antibody (Routine Testing)  Once,   R     Signed and Held   Signed and Held  Basic metabolic panel  Once,   R     Signed and Held      Vitals/Pain Today's Vitals   08/26/17 2230 08/26/17 2324 08/27/17 0004 08/27/17 0043  BP: 109/63 107/64 (!) 101/55 (!) 95/52  Pulse: (!) 102 100 92 85  Resp: (!) 27 19 (!) 24 19  Temp:      TempSrc:      SpO2: 97% 100% 97% 99%  Weight:      Height:      PainSc:        Isolation Precautions No active isolations  Medications Medications  azithromycin (ZITHROMAX) 500 mg in sodium chloride 0.9 % 250 mL IVPB (has no administration in time range)  ceFEPIme (MAXIPIME) 1 g in sodium chloride 0.9 % 100 mL IVPB (has no administration in time range)  potassium chloride SA (K-DUR,KLOR-CON) CR tablet 40 mEq (has no administration in time range)  vancomycin (VANCOCIN) 1,250 mg in sodium chloride 0.9 % 250 mL IVPB (has no administration in time range)  sodium chloride 0.9 % bolus 1,000 mL (1,000 mLs Intravenous New Bag/Given 08/26/17 2123)  piperacillin-tazobactam (ZOSYN) IVPB 3.375 g (0 g Intravenous Stopped 08/26/17 2226)  vancomycin (VANCOCIN) IVPB 1000 mg/200 mL premix (1,000 mg Intravenous New Bag/Given 08/26/17 2156)  fentaNYL (SUBLIMAZE) injection 25 mcg (25 mcg Intravenous Given 08/26/17 2123)  ondansetron (ZOFRAN) injection 4 mg (4 mg Intravenous Given 08/26/17 2123)  ibuprofen (ADVIL,MOTRIN) tablet 600 mg (600 mg Oral Given 08/26/17 2153)  iopamidol (ISOVUE-300) 61 % injection 100 mL (100 mLs Intravenous Contrast Given 08/26/17 2321)  sodium chloride 0.9 % bolus 1,000 mL (1,000 mLs Intravenous New  Bag/Given 08/27/17 0015)

## 2017-08-27 NOTE — Progress Notes (Signed)
Pharmacy Brief note: Pomalidomide Administrator)  Per [facility] policy, Pomalidomide is held when any of the following occurs:  Pomalidomide (Pomalyst) hold criteria:  ANC < 500  ANC < 1000 with fever 38.5C or above  Platelets < 25K  Bilirubin > 2 mg/dL  AST/ALT > 3x ULN  SCr > 3 mg/dL  Acute venous thromboembolic event  Active infection  Patient was admitted on 7/25 with PNA.   Pomalidomide has been held (discontinued from the profile) per policy.   Thanks Dorrene German 08/27/2017 2:50 AM

## 2017-08-27 NOTE — Progress Notes (Signed)
Patient does not need to be on contact for history of ESBL per infectious disease RN.  Contact precautions discontinued.

## 2017-08-28 ENCOUNTER — Inpatient Hospital Stay (HOSPITAL_COMMUNITY): Payer: Medicaid Other

## 2017-08-28 DIAGNOSIS — B181 Chronic viral hepatitis B without delta-agent: Secondary | ICD-10-CM

## 2017-08-28 DIAGNOSIS — E876 Hypokalemia: Secondary | ICD-10-CM

## 2017-08-28 LAB — BASIC METABOLIC PANEL
Anion gap: 5 (ref 5–15)
BUN: 7 mg/dL (ref 6–20)
CO2: 23 mmol/L (ref 22–32)
Calcium: 7.8 mg/dL — ABNORMAL LOW (ref 8.9–10.3)
Chloride: 112 mmol/L — ABNORMAL HIGH (ref 98–111)
Creatinine, Ser: 0.6 mg/dL (ref 0.44–1.00)
Glucose, Bld: 84 mg/dL (ref 70–99)
Potassium: 3.7 mmol/L (ref 3.5–5.1)
SODIUM: 140 mmol/L (ref 135–145)

## 2017-08-28 LAB — URINE CULTURE: Culture: <10000 — AB

## 2017-08-28 LAB — CBC
HCT: 28.4 % — ABNORMAL LOW (ref 36.0–46.0)
HEMOGLOBIN: 8.7 g/dL — AB (ref 12.0–15.0)
MCH: 27.5 pg (ref 26.0–34.0)
MCHC: 30.6 g/dL (ref 30.0–36.0)
MCV: 89.9 fL (ref 78.0–100.0)
PLATELETS: 114 10*3/uL — AB (ref 150–400)
RBC: 3.16 MIL/uL — ABNORMAL LOW (ref 3.87–5.11)
RDW: 16.7 % — ABNORMAL HIGH (ref 11.5–15.5)
WBC: 5.4 10*3/uL (ref 4.0–10.5)

## 2017-08-28 LAB — HIV ANTIBODY (ROUTINE TESTING W REFLEX): HIV SCREEN 4TH GENERATION: NONREACTIVE

## 2017-08-28 LAB — MAGNESIUM: MAGNESIUM: 1.8 mg/dL (ref 1.7–2.4)

## 2017-08-28 MED ORDER — SODIUM CHLORIDE 0.9% FLUSH
10.0000 mL | INTRAVENOUS | Status: DC | PRN
  Administered 2017-08-29: 10 mL
  Filled 2017-08-28: qty 40

## 2017-08-28 NOTE — Progress Notes (Signed)
PROGRESS NOTE    Sheryl [name]  OJJ:[id number] DOB: 11-05-1970 DOA: 08/26/2017 PCP: [name], MD   Brief Narrative: Sheryl [name] is a 47 y.o. female with a history of hepatitis B and plasma cell leukemia currently on chemotherapy. She presents secondary to RUQ abdominal pain found to have a right lower lobe pneumonia. She was initially started on vancomycin/cefepime/azithromycin but is now on ceftriaxone and azithromycin. Blood/urine cultures obtained and are pending.   Assessment & Plan:   Active Problems:   Abdominal pain   Chronic hepatitis B without delta agent without hepatic coma (HCC)   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HCC)   Pneumonia   Abdominal pain Likely pleuritic pain. CT abdomen/pelvis significant for a 12 mm hepatic cyst that is unchanged. Improved. -Urine culture obtained and is pending  Right lower lobe pneumonia Community acquired. MRSA PCR negative. Initially started vancomycin, cefepime and azithromycin. Afebrile overnight. 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 Burr Medico. Last treatment 7/15. Pomalyst held on admission secondary to active infection. -Oncology added to care team  Hypokalemia Given supplementation. Patient takes potassium chronically as an outpatient  Hepatitis B Follows with infectious disease outpatient. -Continue acyclovir and tenofovir  Chronic anemia Normocytic. Likely chronic disease. Stable.   DVT prophylaxis: Lovenox Code Status:   Code Status: Full Code Family Communication: None at bedside Disposition Plan: Discharge likely in 24 hours if afebrile   Consultants:   None  Procedures:   None  Antimicrobials:  Vancomycin (7/24)  Cefepime (7/24)  Azithromycin (7/24>>  Ceftriaxone (7/25>>  Tenofavir (7/24>>  Acyclovir  (7/24>>   Subjective: Pain with coughing. Otherwise, she is feeling better.  Objective: Vitals:   08/27/17 1454 08/27/17 2102 08/28/17 0157 08/28/17 0458  BP:  118/79  112/76  Pulse:  78  92  Resp:  16  20  Temp: 97.9 F (36.6 C) 99.3 F (37.4 C) 99.1 F (37.3 C) 99.7 F (37.6 C)  TempSrc: Oral Oral Oral Oral  SpO2:  98%  97%  Weight:      Height:        Intake/Output Summary (Last 24 hours) at 08/28/2017 1016 Last data filed at 08/28/2017 0600 Gross per 24 hour  Intake 2625 ml  Output 1000 ml  Net 1625 ml   Filed Weights   08/26/17 2034 08/27/17 0235  Weight: 59 kg (130 lb) 62.3 kg (137 lb 5.6 oz)    Examination:  General exam: Appears calm and comfortable Respiratory system: Diminished throughout with mild rales. Respiratory effort normal. Cardiovascular system: S1 & S2 heard, RRR. No murmurs, rubs, gallops or clicks. Gastrointestinal system: Abdomen is nondistended, soft and nontender. Normal bowel sounds heard. Central nervous system: Alert and oriented. No focal neurological deficits. Extremities: No edema. No calf tenderness Skin: No cyanosis. No rashes Psychiatry: Judgement and insight appear normal. Mood & affect appropriate.     Data Reviewed: I have personally reviewed following labs and imaging studies  CBC: Recent Labs  Lab 08/26/17 2130 08/28/17 0654  WBC 9.0 5.4  NEUTROABS 6.0  --   HGB 10.0* 8.7*  HCT 32.2* 28.4*  MCV 87.3 89.9  PLT 150 937*   Basic Metabolic Panel: Recent Labs  Lab 08/26/17 2130 08/26/17 2249 08/27/17 0603 08/28/17 0654  NA 146*  --  144 140  K 3.4*  --  4.7 3.7  CL 108  --  115* 112*  CO2 27  --  22 23  GLUCOSE 85  --  68* 84  BUN 12  --  11 7  CREATININE 0.75  --  0.87 0.60  CALCIUM 9.4  --  8.1* 7.8*  MG  --  1.4*  --  1.8   GFR: Estimated Creatinine Clearance: 70.4 mL/min (by C-G formula based on SCr of 0.6 mg/dL). Liver Function Tests: Recent Labs  Lab 08/26/17 2130  AST 22  ALT 8  ALKPHOS 58   BILITOT 0.8  PROT 6.7  ALBUMIN 3.3*   Recent Labs  Lab 08/26/17 2131  LIPASE 23   No results for input(s): AMMONIA in the last 168 hours. Coagulation Profile: Recent Labs  Lab 08/26/17 2130  INR 1.08   Cardiac Enzymes: No results for input(s): CKTOTAL, CKMB, CKMBINDEX, TROPONINI in the last 168 hours. BNP (last 3 results) No results for input(s): PROBNP in the last 8760 hours. HbA1C: No results for input(s): HGBA1C in the last 72 hours. CBG: No results for input(s): GLUCAP in the last 168 hours. Lipid Profile: No results for input(s): CHOL, HDL, LDLCALC, TRIG, CHOLHDL, LDLDIRECT in the last 72 hours. Thyroid Function Tests: No results for input(s): TSH, T4TOTAL, FREET4, T3FREE, THYROIDAB in the last 72 hours. Anemia Panel: No results for input(s): VITAMINB12, FOLATE, FERRITIN, TIBC, IRON, RETICCTPCT in the last 72 hours. Sepsis Labs: Recent Labs  Lab 08/26/17 2143  LATICACIDVEN 1.25    Recent Results (from the past 240 hour(s))  Culture, blood (Routine x 2)     Status: None (Preliminary result)   Collection Time: 08/26/17  9:31 PM  Result Value Ref Range Status   Specimen Description BLOOD PORTA CATH RIGHT CHEST  Final   Special Requests   Final    BOTTLES DRAWN AEROBIC AND ANAEROBIC Blood Culture results may not be optimal due to an inadequate volume of blood received in culture bottles Performed at [facility], [place] [address], [place], [place] [address]    Culture   Final    NO GROWTH 2 DAYS Performed at [facility] [address], [place], [place] [address]    Report Status PENDING  Incomplete  Culture, blood (Routine x 2)     Status: None (Preliminary result)   Collection Time: 08/26/17 10:12 PM  Result Value Ref Range Status   Specimen Description   Final    BLOOD RIGHT HAND Performed at [facility] [address], [place], [place] [address]    Special Requests   Final    BOTTLES DRAWN AEROBIC ONLY  Blood Culture results may not be optimal due to an inadequate volume of blood received in culture bottles Performed at [facility] [address], [place], [place] [address]    Culture   Final    NO GROWTH 2 DAYS Performed at [facility] [address] [place], [place] [address]    Report Status PENDING  Incomplete  Culture, Urine     Status: Abnormal   Collection Time: 08/27/17  3:25 AM  Result Value Ref Range Status   Specimen Description   Final    URINE, RANDOM Performed at [facility] [address], [place], [place] [address]    Special Requests   Final    NONE Performed at [facility], [place] [address] [place], [place] [address]    Culture (A)  Final    <10,000 COLONIES/mL INSIGNIFICANT GROWTH Performed at [facility], [address], [place], [place] [address]    Report Status 08/28/2017 FINAL  Final  MRSA PCR Screening     Status: None   Collection Time: 08/27/17  9:10 AM  Result Value Ref Range Status   MRSA by PCR NEGATIVE NEGATIVE Final    Comment:        The GeneXpert MRSA Assay (FDA approved for NASAL specimens only), is one component of a comprehensive MRSA colonization surveillance program. It is not intended to diagnose MRSA infection nor to guide or monitor treatment for MRSA infections. Performed at [facility], [place] [address], [place], [place] [address]          Radiology Studies: Dg Chest 2 View  Result Date: 08/26/2017 CLINICAL DATA:  Fever EXAM: CHEST - 2 VIEW COMPARISON:  06/08/2017, 04/06/2017 FINDINGS: Right-sided central venous port tip over the cavoatrial region. Patchy right middle lobe opacity. Stable borderline cardiomegaly. No pneumothorax. IMPRESSION: Patchy right middle lobe opacity may reflect atelectasis or a mild infiltrate. Electronically Signed   By: Donavan Foil M.D.   On: 08/26/2017 21:17   Ct Abdomen Pelvis W Contrast  Result  Date: 08/26/2017 CLINICAL DATA:  Abdominal pain and fever starting yesterday. Vomiting. Currently undergoing chemotherapy for leukemia. EXAM: CT ABDOMEN AND PELVIS WITH CONTRAST TECHNIQUE: Multidetector CT imaging of the abdomen and pelvis was performed using the standard protocol following bolus administration of intravenous contrast. CONTRAST:  138mL ISOVUE-300 IOPAMIDOL (ISOVUE-300) INJECTION 61% COMPARISON:  05/20/2015 FINDINGS: Lower chest: Motion artifact limits examination. There is hazy parenchymal opacity diffusely throughout the lung bases which may indicate edema or pneumonia. There is focal consolidation in the inferior right middle lung likely due to pneumonia. Mild cardiac enlargement. Hepatobiliary: Cyst in the right lobe of the liver measuring 12 mm diameter. No change since prior study. Gallbladder and bile ducts are unremarkable. Pancreas: Unremarkable. No pancreatic ductal dilatation or surrounding inflammatory changes. Spleen: There is a multiloculated cystic mass lateral and posterior to the spleen, possibly involving the splenic capsule. The lesion measures up to about 2 x 7.4 cm in diameter. This is similar in appearance to previous study without significant progression. Adrenals/Urinary Tract: No adrenal gland nodules. Subcentimeter renal cysts bilaterally. 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 No hydronephrosis or hydroureter. No ureteral stones. Bladder wall is thickened possibly indicating cystitis. No bladder filling defects. Stomach/Bowel: Stomach, small bowel, and colon are not abnormally distended. Stool diffusely fills the colon. No wall thickening or inflammatory changes. The appendix is not identified. Vascular/Lymphatic: No significant vascular findings are present. No enlarged abdominal or pelvic lymph nodes. Reproductive: Nodular enlargement of the uterus likely representing fibroids. No abnormal adnexal  masses. Other: No free air or free fluid in the abdomen. Abdominal wall musculature appears intact. Musculoskeletal: No acute or significant osseous findings. Calcifications in the subcutaneous fat over the gluteal regions likely representing injection granulomas. IMPRESSION: 1. Patchy parenchymal opacity in the lung bases may indicate edema or pneumonia. Focal consolidation in the inferior right middle lung likely represents pneumonia. 2. No evidence of bowel obstruction or inflammation. Bladder wall thickening may indicate cystitis. 3. 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 This is similar to previous study without progression. 4. 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 No change. 5. Uterine fibroids. Electronically Signed   By: Lucienne Capers M.D.   On: 08/26/2017 23:52   Dg Chest Port 1  View  Result Date: 08/28/2017 CLINICAL DATA:  Hx of Hep B and plasma cell leukemia. Pt has been SOB with right lower anterior chest pains recently. Possibly pneumonia. EXAM: PORTABLE CHEST 1 VIEW COMPARISON:  08/26/2017 FINDINGS: Hazy opacities noted both lung bases, right greater than left, consistent with atelectasis or pneumonia, or a combination. Left base opacity is new since the previous day's study supporting atelectasis. Remainder of the lungs is clear. Heart, mediastinum and hila are unremarkable. Right anterior chest wall Port-A-Cath is stable. IMPRESSION: 1. Persistent right lung base opacity that may reflect pneumonia or atelectasis. 2. New mild left lung base opacity most likely atelectasis. Electronically Signed   By: Lajean Manes M.D.   On: 08/28/2017 10:09        Scheduled Meds: . acyclovir  800 mg Oral BID  . enoxaparin (LOVENOX) injection  40 mg Subcutaneous Daily  . feeding supplement (ENSURE ENLIVE)  237 mL Oral BID BM  . pantoprazole  40 mg Oral Daily  . potassium chloride SA  20 mEq Oral Daily  . tenofovir  300 mg Oral Daily    Continuous Infusions: . sodium chloride 75 mL/hr at 08/27/17 1900  . azithromycin Stopped (08/27/17 2321)  . cefTRIAXone (ROCEPHIN)  IV Stopped (08/27/17 1405)     LOS: 1 day     Cordelia Poche, MD [facility] 08/28/2017, 10:16 AM Pager: [phone]  If 7PM-7AM, please contact night-coverage [email or web address] 08/28/2017, 10:16 AM

## 2017-08-29 LAB — CBC
HEMATOCRIT: 29.4 % — AB (ref 36.0–46.0)
HEMOGLOBIN: 9 g/dL — AB (ref 12.0–15.0)
MCH: 27.1 pg (ref 26.0–34.0)
MCHC: 30.6 g/dL (ref 30.0–36.0)
MCV: 88.6 fL (ref 78.0–100.0)
Platelets: 163 10*3/uL (ref 150–400)
RBC: 3.32 MIL/uL — ABNORMAL LOW (ref 3.87–5.11)
RDW: 16.5 % — AB (ref 11.5–15.5)
WBC: 5 10*3/uL (ref 4.0–10.5)

## 2017-08-29 LAB — LEGIONELLA PNEUMOPHILA SEROGP 1 UR AG: L. PNEUMOPHILA SEROGP 1 UR AG: NEGATIVE

## 2017-08-29 MED ORDER — CEFDINIR 300 MG PO CAPS: 300.0000 mg | ORAL_CAPSULE | Freq: Two times a day (BID) | ORAL | 0 refills | Status: AC

## 2017-08-29 MED ORDER — HYDROCOD POLST-CPM POLST ER 10-8 MG/5ML PO SUER: 5.0000 mL | Freq: Two times a day (BID) | ORAL | 0 refills | Status: DC | PRN

## 2017-08-29 MED ORDER — HEPARIN SOD (PORK) LOCK FLUSH 100 UNIT/ML IV SOLN
500.0000 [IU] | INTRAVENOUS | Status: AC | PRN
  Administered 2017-08-29: 500 [IU]

## 2017-08-29 MED ORDER — AZITHROMYCIN 500 MG PO TABS: 500.0000 mg | ORAL_TABLET | Freq: Every day | ORAL | 0 refills | Status: AC

## 2017-08-29 MED ORDER — IBUPROFEN 200 MG PO TABS
600.0000 mg | ORAL_TABLET | Freq: Four times a day (QID) | ORAL | Status: DC | PRN
  Administered 2017-08-29: 600 mg via ORAL
  Filled 2017-08-29: qty 3

## 2017-08-29 NOTE — Discharge Instructions (Signed)
Sheryl [name],  Please stop taking your POMALYST until you call your oncologist, Dr. Burr Medico. Please continue to take all your antibiotics    Vim ph?i c?ng ??ng, Ng??i l?n. Community-Acquired Pneumonia, Adult Vim ph?i l b?nh nhi?m trng ph?i. C nh?ng lo?i vim ph?i khc nhau. M?t lo?i c th? pht tri?n khi m?t ng??i ph?i n?m vi?n. M?t lo?i khc, g?i l vim ph?i c?ng ??ng, pht tri?n ? nh?ng ng??i khng, ho?c m?i ?y ch?a, ph?i n?m vi?n ho?c n?m ? c? s? ch?m [place] s?c kh?e khc. Nguyn nhn g gy ra? Vim ph?i c th? do vi khu?n, vi rt ho?c n?m gy ra. Vim ph?i c?ng ??ng th??ng do vi khu?n gy ra Vim ph?i khu?n lin c?u. Nh?ng vi khu?n ny th??ng t? ng??i ny truy?n sang ng??i khc thng qua vi?c ht ph?i nh?ng gi?t n??c nh? do ng??i nhi?m b?nh ho ho?c h?t h?i. ?i?u g lm t?ng nguy c?? Tnh tr?ng ny hay x?y ra h?n ?:  Nh?ng ng??i b?cc b?nh m?n tnh, ch?ng h?n nh? b?nh ph?i t?c ngh?n m?n tnh (COPD), hen suy?n, suy tim sung huy?t, x? nang, ti?u ???ng, ho?c b?nh th?n.  Nh?ng ng??i m?cHIV giai ?o?n s?m ho?c giai ?o?n mu?n.  Nh?ng ng??i b?b?nh h?ng c?u hnh li?m.  Nh?ng ng??i? b? c?t lch (ph?u thu?t c?t lch).  Nh?ng ng??i cv? sinh r?ng mi?ng km.  Nh?ng ng??i ccc tnh tr?ng b?nh l lm t?ng nguy c? ht ph?i(ht ph?i) ch?t bi ti?t t? chnh mi?ng v m?i c?a h?.  Nh?ng ng??i ch? mi?n d?ch y?u (suy gi?m mi?n d?ch).  Nh?ng ng??i ht thu?c.  Nh?ng ng??i?i ??n nh?ng khu v?c m vi trng gy vim ph?i th??ng t?n t?i.  Nh?ng ng??i? bn c?nh nh?ng loi ??ng v?t c th? c vi trng gy vim ph?i, bao g?m chim, d?i, th?, mo v cc gia [place].  Cc d?u hi?u ho?c tri?u ch?ng l g? Nh?ng tri?u ch?ng c?a tnh tr?ng ny bao g?m:  [place].  Ho ??t (ho c ??m).  S?t.  ?? m? hi.  ?au ng?c, ??c bi?t khi th? su ho?c ho.  Th? nhanh ho?c kh th?.  Kh th?.  Rng mnh v l?nh.  M?t m?i.  ?au nh?c c?.  Ch?n ?on tnh tr?ng ny nh? th? no? Chuyn gia ch?m [place] s?c kh?e c?a  qu v? s? h?i v? b?nh s? v khm th?c th? cho qu v?. [name] c?ng c th? ph?i lm cc ki?m tra khc, bao g?m:  Ki?m tra ng?c b?ng hnh ?nh, bao g?m ch?p X quang.  Nh?ng xt nghi?m ?? ki?m tra n?ng ??  xi trong mu v cc kh mu khc.  Nh?ng xt nghi?m khc v?i mu, d?ch nh?y (??m), ch?t d?ch quanh ph?i (d?ch mng ph?i), v n??c ti?u.  N?u vim ph?i n?ng, c th? c?n lm nh?ng xt nghi?m khc ?? tm nguyn nhn c? th? gy b?nh. Tnh tr?ng ny ???c ?i?u tr? nh? th? no? Lo?i ?i?u tr? dnh cho qu v? ty thu?c vo nhi?u nhn t?, ch?ng h?n nh? nguyn nhn gy vim ph?i, nh?ng lo?i thu?c qu v? dng v nh?ng tnh tr?ng b?nh l khc c?a qu v?. ??i v?i h?u h?t ng??i l?n, vi?c ?i?u tr? v ph?c h?i b?nh vim ph?i c th? di?n ra ? nh. Trong m?t s? tr??ng h?p c?n ph?i ?i?u tr? trong b?nh vi?n. ?i?u tr? c th? bao g?m:  Thu?c khng sinh, n?u vim ph?i do vi khu?n gy ra.  Thu?c ch?ng vi rt, n?u  vim ph?i do vi rt gy ra.  Thu?c dng qua ???ng mi?ng ho?c qua ???ng truy?n t?nh m?ch.   xi.  Li?u php h h?p.  M?c d hi?m g?p, vi?c ?i?u tr? vim ph?i n?ng c th? bao g?m:  Thng kh c? h?c. Vi?c ny ???c th?c hi?n n?u qu v? khng t? th? ???c t?t v qu v? khng th? duy tr n?ng ??  xi an ton trong mu.  Ch?c d mng ph?i. Th? thu?t nylo?i b? d?ch ? quanh m?t bn ph?i ho?c c? hai bn ?? gip qu v? th? t?t h?n.  Tun th? nh?ng h??ng d?n ny ? nh:  Ch? s? d?ng thu?c khng k ??n v thu?c k ??n theo ch? d?n c?a chuyn gia ch?m [place] s?c kh?e. ? Ch? dngthu?c ho n?u qu v? b? m?t ng?. Hi?u r?ng thu?c ho c th? ng?n c?n kh? n?ng lo?i b? d?ch theo cch t? nhin c?a c? th? qu v? ra kh?i ph?i. ? N?u qu v? ???c k thu?c khng sinh, hy dng thu?c theo ch? d?n c?a chuyn gia ch?m [place] s?c kh?e. Khng d?ng u?ng thu?c khng sinh ngay c? khi qu v? b?t ??u c?m th?y ?? h?n.  Ng? ? t? th? n?a n?m n?a ng?i vo ban ?m. Th? ng? trn m?t chi?c gh? t?a, ho?c ??t vi ci g?i d??i ??u.  Khng s? d?ng cc  s?n ph?m thu?c l, bao g?m thu?c l d?ng ht, thu?c l d?ng nhai v thu?c l ?i?n t?. N?u qu v? c?n gip ?? ?? cai thu?c, hy h?i chuyn gia ch?m [place] s?c kh?e.  U?ng ?? n??c ?? n??c ti?u trong ho?c c mu vng nh?t. Vi?c ny s? gip lm l?ng d?ch nh?y trong ph?i c?a qu v?. Ng?n ng?a tnh tr?ng ny b?ng cch no? C nh?ng cch m qu v? c th? gi?m nguy c? b? b?nh vim ph?i c?ng ??ng. Hy xem xt vi?c tim v?c xin ph? c?u khu?n n?u:  [name] trn 63 [name]?i.  [name] h?n 19 [name]?i v ?ang ?i?u tr? ung th?, c b?nh ph?i m?n tnh, ho?c c nh?ng b?nh l khc ?nh h??ng ??n h? mi?n d?ch. Hy h?i chuyn gia ch?m [place] s?c kh?e xem vi?c ny c p d?ng cho qu v? hay khng.  C nh?ng lo?i v?c xin khc nhau v cc l?ch trnh tim v?c xin ph? c?u khu?n khc nhau. Hy h?i chuyn gia ch?m [place] s?c kh?e c?a qu v? xem ph??ng n tim v?c xin no l t?t nh?t cho qu v?. [name] c?ng c th? ng?n ng?a vi?c b? vim ph?i c?ng ??ng n?u qu v? th?c hi?n nh?ng hnh ??ng sau:  Tim v?c xin cm hng n?m. Hy h?i chuyn gia ch?m [place] s?c kh?e c?a qu v? xem lo?i v?c xin cm no l t?t nh?t cho qu v?.  Th??ng xuyn ??n g?p nha s?.  R?a tay th??ng xuyn. S? d?ng thu?c r?a tay n?u khng c x phng v n??c.  Hy lin l?c v?i chuyn gia ch?m [place] s?c kh?e n?u:  [name] b? s?t.  [name] b? m?t ng? v qu v? khng th? ki?m sot c?n ho b?ng thu?c ho. Yu c?u tr? gip ngay l?p t?c n?u:  [name] b? kh th? h?n.  [name] b? ?au ng?c nhi?u h?n.  B?nh c?a qu v? tr? nn tr?m tr?ng h?n, ??c bi?t n?u qu v? l ng??i cao [name]?i ho?c c h? mi?n d?ch y?u.  [name] ho ra mu. Thng  tin ny khng nh?m m?c ?ch thay th? cho l?i khuyn m chuyn gia ch?m [place] s?c kh?e ni v?i qu v?. Hy b?o ??m qu v? ph?i th?o lu?n b?t k? v?n ?? g m qu v? c v?i chuyn gia ch?m  s?c kh?e c?a qu v?. Document Released: 01/20/2005 Document Revised: 05/05/2016 Document Reviewed: 05/17/2014 Elsevier Interactive Patient Education  2018 [employer].

## 2017-08-29 NOTE — Progress Notes (Signed)
Reviewed discharge information with patient and daughter. Answered all questions. Pt/dtr. able to teach back medications and reasons to contact MD/911. Patient/dtr verbalizes importance of PCP follow up appointment.  Barbee [place]. Brigitte Pulse, RN

## 2017-08-29 NOTE — Discharge Summary (Signed)
Physician Discharge Summary  Blayke Pinera ZYS:[id number] DOB: [birth year] DOA: 08/26/2017  PCP: [name], MD  Admit date: 08/26/2017 Discharge date: 08/29/2017  Admitted From: Home Disposition: Home  Recommendations for Outpatient Follow-up:  1. Follow up with PCP in 1 week 2. Follow up with oncology in 2 days to address Polamyst treatment 3. Repeat chest x-ray in 2-3 weeks 4. Please obtain BMP/CBC in one week 5. Please follow up on the following pending results: Blood culture (final result)  Home Health: None Equipment/Devices: None  Discharge Condition: Stable CODE STATUS: Full code Diet recommendation: Regular   Brief/Interim Summary:  Admission HPI written by Bethena Roys, MD   Chief Complaint: Fever, Abdominal pain, cough  HPI: Nylan Goodell is a 47 y.o. 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 Patient is [place] speaking, speaks little English able to answer simple questions, daughter present at bedside helped with some interpretation. Patient presented to the Ed with c/o abdominal pain of one day duration, diffuse, denies dysuria.  Patient vomited 5 times today, non-bloody.  No loose stools.   Patient reports cough ~ 2weeks duration, dry, but has progressively worsened over the past few days.  Denies shortness of breath.  Also reports fevers intermittent since yesterday. Denies neck pain or stiffness.   ED Course: Febrile- 103.1, tachycardia- 108, tachypnea to 27, O2 sats greater than 96% on room air.  WBC- 9.  Lactic acid- 1.25, lipase- 23.  UA-trace leukocytes rare bacteria.  Two-view chest x-ray -patchy right middle lobe opacity may reflect atelectasis or mild infiltrate. CT abd & pelvis W contrast-focal consolidation inferior right midlung likely represent pneumonia.  2L bolus given normal saline . Patient started on IV Vanco and Zosyn in the ED hospitalist called to admit for  pneumonia.    Hospital course:  Abdominal pain Likely pleuritic pain. CT abdomen/pelvis significant for a 12 mm hepatic cyst that is unchanged. Improved. Urine culture with insignificant growth. Pain improving with pneumonia treatment.  Right lower lobe pneumonia Community acquired. MRSA PCR negative. Initially started vancomycin, cefepime and azithromycin. Afebrile overnight. Blood culture with no growth to date. Transitioned to Cefdinir and azithromycin by mouth on discharge. Repeat chest x-ray in 2-3 weeks.  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 Burr Medico. Last treatment 7/15. Pomalyst held on admission secondary to active infection. Recommend restarting pending Oncology recommendations as an outpatient.  Hypokalemia Given supplementation. Patient takes potassium chronically as an outpatient  Hepatitis B Follows with infectious disease outpatient. Continue acyclovir and tenofovir  Chronic anemia Normocytic. Likely chronic disease. Stable.   Discharge Diagnoses:  Active Problems:   Abdominal pain   Chronic hepatitis B without delta agent without hepatic coma (HCC)   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HCC)   Pneumonia    Discharge Instructions  Discharge Instructions    Call MD for:  persistant nausea and vomiting   Complete by:  As directed    Call MD for:  severe uncontrolled pain   Complete by:  As directed    Call MD for:  temperature >100.4   Complete by:  As directed    Increase activity slowly   Complete by:  As directed      Allergies as of 08/29/2017   No Known Allergies     Medication List    STOP taking these medications   pomalidomide 2 MG capsule Commonly known as:  POMALYST     TAKE these medications  acyclovir 800 MG tablet Commonly known as:  ZOVIRAX Take 1 tablet (800 mg total) by mouth 2 (two) times daily.   azithromycin 500 MG tablet Commonly known as:  ZITHROMAX Take 1 tablet (500 mg total) by mouth daily for 2  days. Take 1 tablet daily for 3 days.   cefdinir 300 MG capsule Commonly known as:  OMNICEF Take 1 capsule (300 mg total) by mouth 2 (two) times daily for 4 days.   chlorpheniramine-HYDROcodone 10-8 MG/5ML Suer Commonly known as:  TUSSIONEX Take 5 mLs by mouth every 12 (twelve) hours as needed for cough.   feeding supplement (ENSURE ENLIVE) Liqd Take 237 mLs by mouth 2 (two) times daily between meals.   ibuprofen 200 MG tablet Commonly known as:  ADVIL,MOTRIN Take 200 mg by mouth every 6 (six) hours as needed for moderate pain.   magic mouthwash Soln Take 5 mLs by mouth 4 (four) times daily as needed for mouth pain.   omeprazole 20 MG capsule Commonly known as:  PRILOSEC Take 1 capsule (20 mg total) by mouth daily.   ondansetron 8 MG tablet Commonly known as:  ZOFRAN Take 1 tablet (8 mg total) by mouth every 8 (eight) hours as needed for nausea or vomiting.   polyethylene glycol packet Commonly known as:  MIRALAX / GLYCOLAX Take 17 g by mouth daily as needed (constipation).   potassium chloride SA 20 MEQ tablet Commonly known as:  K-DUR,KLOR-CON Take 1 tablet (20 mEq total) by mouth daily.   prochlorperazine 10 MG tablet Commonly known as:  COMPAZINE Take 1 tablet (10 mg total) by mouth every 6 (six) hours as needed for nausea or vomiting.   promethazine 25 MG tablet Commonly known as:  PHENERGAN Take 1 tablet (25 mg total) by mouth every 6 (six) hours as needed for nausea or vomiting.   ursodiol 300 MG capsule Commonly known as:  ACTIGALL Take 1 capsule (300 mg total) by mouth 2 (two) times daily.   VIREAD 300 MG tablet Generic drug:  tenofovir Take 1 tablet (300 mg total) by mouth daily.      Follow-up Information    Truitt Merle, MD. Schedule an appointment as soon as possible for a visit in 2 day(s).   Specialties:  Hematology, Oncology Why:  Hospital follow-up Contact information: [place] [address] [phone]         [name], MD. Schedule an appointment as soon as possible for a visit in 1 week(s).   Specialty:  Family Medicine Why:  Hospital follow-up Contact information: [place] [address] [phone]          No Known Allergies  Consultations:  None   Procedures/Studies: Dg Chest 2 View  Result Date: 08/26/2017 CLINICAL DATA:  Fever EXAM: CHEST - 2 VIEW COMPARISON:  06/08/2017, 04/06/2017 FINDINGS: Right-sided central venous port tip over the cavoatrial region. Patchy right middle lobe opacity. Stable borderline cardiomegaly. No pneumothorax. IMPRESSION: Patchy right middle lobe opacity may reflect atelectasis or a mild infiltrate. Electronically Signed   By: Donavan Foil M.D.   On: 08/26/2017 21:17   Ct Abdomen Pelvis W Contrast  Result Date: 08/26/2017 CLINICAL DATA:  Abdominal pain and fever starting yesterday. Vomiting. Currently undergoing chemotherapy for leukemia. EXAM: CT ABDOMEN AND PELVIS WITH CONTRAST TECHNIQUE: Multidetector CT imaging of the abdomen and pelvis was performed using the standard protocol following bolus administration of intravenous contrast. CONTRAST:  177mL ISOVUE-300 IOPAMIDOL (ISOVUE-300) INJECTION 61% COMPARISON:  05/20/2015 FINDINGS:  Lower chest: Motion artifact limits examination. There is hazy parenchymal opacity diffusely throughout the lung bases which may indicate edema or pneumonia. There is focal consolidation in the inferior right middle lung likely due to pneumonia. Mild cardiac enlargement. Hepatobiliary: Cyst in the right lobe of the liver measuring 12 mm diameter. No change since prior study. Gallbladder and bile ducts are unremarkable. Pancreas: Unremarkable. No pancreatic ductal dilatation or surrounding inflammatory changes. Spleen: There is a multiloculated cystic mass lateral and posterior to the spleen, possibly involving the splenic capsule. The lesion measures up to about 2 x 7.4 cm in diameter. This is similar in  appearance to previous study without significant progression. Adrenals/Urinary Tract: No adrenal gland nodules. Subcentimeter renal cysts bilaterally. 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 No hydronephrosis or hydroureter. No ureteral stones. Bladder wall is thickened possibly indicating cystitis. No bladder filling defects. Stomach/Bowel: Stomach, small bowel, and colon are not abnormally distended. Stool diffusely fills the colon. No wall thickening or inflammatory changes. The appendix is not identified. Vascular/Lymphatic: No significant vascular findings are present. No enlarged abdominal or pelvic lymph nodes. Reproductive: Nodular enlargement of the uterus likely representing fibroids. No abnormal adnexal masses. Other: No free air or free fluid in the abdomen. Abdominal wall musculature appears intact. Musculoskeletal: No acute or significant osseous findings. Calcifications in the subcutaneous fat over the gluteal regions likely representing injection granulomas. IMPRESSION: 1. Patchy parenchymal opacity in the lung bases may indicate edema or pneumonia. Focal consolidation in the inferior right middle lung likely represents pneumonia. 2. No evidence of bowel obstruction or inflammation. Bladder wall thickening may indicate cystitis. 3. 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 This is similar to previous study without progression. 4. 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 No change. 5. Uterine fibroids. Electronically Signed   By: Lucienne Capers M.D.   On: 08/26/2017 23:52   Dg Chest Port 1 View  Result Date: 08/28/2017 CLINICAL DATA:  Hx of Hep B and plasma cell leukemia. Pt has been SOB with right lower anterior chest pains recently. Possibly pneumonia. EXAM: PORTABLE CHEST 1 VIEW COMPARISON:  08/26/2017 FINDINGS: Hazy opacities noted both lung bases, right greater than  left, consistent with atelectasis or pneumonia, or a combination. Left base opacity is new since the previous day's study supporting atelectasis. Remainder of the lungs is clear. Heart, mediastinum and hila are unremarkable. Right anterior chest wall Port-A-Cath is stable. IMPRESSION: 1. Persistent right lung base opacity that may reflect pneumonia or atelectasis. 2. New mild left lung base opacity most likely atelectasis. Electronically Signed   By: Lajean Manes M.D.   On: 08/28/2017 10:09      Subjective: Coughing improved.  Discharge Exam: Vitals:   08/28/17 2157 08/29/17 0633  BP: 124/80 124/85  Pulse: 72 60  Resp: 16 16  Temp: 99.3 F (37.4 C) 98 F (36.7 C)  SpO2: 100% 99%   Vitals:   08/28/17 0458 08/28/17 1317 08/28/17 2157 08/29/17 0633  BP: 112/76 111/77 124/80 124/85  Pulse: 92 67 72 60  Resp: 20 16 16 16   Temp: 99.7 F (37.6 C) 99.5 F (37.5 C) 99.3 F (37.4 C) 98 F (36.7 C)  TempSrc: Oral Oral Oral Oral  SpO2: 97% 100% 100% 99%  Weight:      Height:        General: Pt is alert, awake, not in acute distress Cardiovascular: RRR,  S1/S2 +, no rubs, no gallops Respiratory: Mild rales, no wheezing. Abdominal: Soft, NT, ND, bowel sounds + Extremities: no edema, no cyanosis    The results of significant diagnostics from this hospitalization (including imaging, microbiology, ancillary and laboratory) are listed below for reference.     Microbiology: Recent Results (from the past 240 hour(s))  Culture, blood (Routine x 2)     Status: None (Preliminary result)   Collection Time: 08/26/17  9:31 PM  Result Value Ref Range Status   Specimen Description BLOOD PORTA CATH RIGHT CHEST  Final   Special Requests   Final    BOTTLES DRAWN AEROBIC AND ANAEROBIC Blood Culture results may not be optimal due to an inadequate volume of blood received in culture bottles Performed at [facility], [place] [address] [place], [place] [address]    Culture    Final    NO GROWTH 3 DAYS Performed at [facility], [place] [address], [place], [place] [address]    Report Status PENDING  Incomplete  Culture, blood (Routine x 2)     Status: None (Preliminary result)   Collection Time: 08/26/17 10:12 PM  Result Value Ref Range Status   Specimen Description   Final    BLOOD RIGHT HAND Performed at [facility] [address], [place], [place] [address]    Special Requests   Final    BOTTLES DRAWN AEROBIC ONLY Blood Culture results may not be optimal due to an inadequate volume of blood received in culture bottles Performed at [facility] [address], [place], [place] [address]    Culture   Final    NO GROWTH 3 DAYS Performed at [facility], [place] [address] [place], [place] [address]    Report Status PENDING  Incomplete  Culture, Urine     Status: Abnormal   Collection Time: 08/27/17  3:25 AM  Result Value Ref Range Status   Specimen Description   Final    URINE, RANDOM Performed at [facility] [address], [place], [place] [address]    Special Requests   Final    NONE Performed at [facility], [place] [address] [place], [place] [address]    Culture (A)  Final    <10,000 COLONIES/mL INSIGNIFICANT GROWTH Performed at [facility] [address], [place], [place] [address]    Report Status 08/28/2017 FINAL  Final  MRSA PCR Screening     Status: None   Collection Time: 08/27/17  9:10 AM  Result Value Ref Range Status   MRSA by PCR NEGATIVE NEGATIVE Final    Comment:        The GeneXpert MRSA Assay (FDA approved for NASAL specimens only), is one component of a comprehensive MRSA colonization surveillance program. It is not intended to diagnose MRSA infection nor to guide or monitor treatment for MRSA infections. Performed at [facility], [place] [address], [place],  [address]      Labs: BNP (last 3  results) No results for input(s): BNP in the last 8760 hours. Basic Metabolic Panel: Recent Labs  Lab 08/26/17 2130 08/26/17 2249 08/27/17 0603 08/28/17 0654  NA 146*  --  144 140  K 3.4*  --  4.7 3.7  CL 108  --  115* 112*  CO2 27  --  22 23  GLUCOSE 85  --  68* 84  BUN 12  --  11 7  CREATININE  0.75  --  0.87 0.60  CALCIUM 9.4  --  8.1* 7.8*  MG  --  1.4*  --  1.8   Liver Function Tests: Recent Labs  Lab 08/26/17 2130  AST 22  ALT 8  ALKPHOS 58  BILITOT 0.8  PROT 6.7  ALBUMIN 3.3*   Recent Labs  Lab 08/26/17 2131  LIPASE 23   No results for input(s): AMMONIA in the last 168 hours. CBC: Recent Labs  Lab 08/26/17 2130 08/28/17 0654 08/29/17 0928  WBC 9.0 5.4 5.0  NEUTROABS 6.0  --   --   HGB 10.0* 8.7* 9.0*  HCT 32.2* 28.4* 29.4*  MCV 87.3 89.9 88.6  PLT 150 114* 163   Cardiac Enzymes: No results for input(s): CKTOTAL, CKMB, CKMBINDEX, TROPONINI in the last 168 hours. BNP: Invalid input(s): POCBNP CBG: No results for input(s): GLUCAP in the last 168 hours. D-Dimer No results for input(s): DDIMER in the last 72 hours. Hgb A1c No results for input(s): HGBA1C in the last 72 hours. Lipid Profile No results for input(s): CHOL, HDL, LDLCALC, TRIG, CHOLHDL, LDLDIRECT in the last 72 hours. Thyroid function studies No results for input(s): TSH, T4TOTAL, T3FREE, THYROIDAB in the last 72 hours.  Invalid input(s): FREET3 Anemia work up No results for input(s): VITAMINB12, FOLATE, FERRITIN, TIBC, IRON, RETICCTPCT in the last 72 hours. Urinalysis    Component Value Date/Time   COLORURINE YELLOW 08/26/2017 2054   APPEARANCEUR CLEAR 08/26/2017 2054   LABSPEC 1.011 08/26/2017 2054   LABSPEC 1.010 04/09/2016 1356   PHURINE 8.0 08/26/2017 2054   GLUCOSEU NEGATIVE 08/26/2017 2054   GLUCOSEU Negative 04/09/2016 1356   HGBUR NEGATIVE 08/26/2017 2054   BILIRUBINUR NEGATIVE 08/26/2017 2054   BILIRUBINUR Negative 04/09/2016 [place] 08/26/2017  2054   PROTEINUR NEGATIVE 08/26/2017 2054   UROBILINOGEN 0.2 04/09/2016 1356   NITRITE NEGATIVE 08/26/2017 2054   LEUKOCYTESUR TRACE (A) 08/26/2017 2054   LEUKOCYTESUR Moderate 04/09/2016 1356   Sepsis Labs Invalid input(s): PROCALCITONIN,  WBC,  LACTICIDVEN Microbiology Recent Results (from the past 240 hour(s))  Culture, blood (Routine x 2)     Status: None (Preliminary result)   Collection Time: 08/26/17  9:31 PM  Result Value Ref Range Status   Specimen Description BLOOD PORTA CATH RIGHT CHEST  Final   Special Requests   Final    BOTTLES DRAWN AEROBIC AND ANAEROBIC Blood Culture results may not be optimal due to an inadequate volume of blood received in culture bottles Performed at [facility], [place] [address], [place], [place] [address]    Culture   Final    NO GROWTH 3 DAYS Performed at [facility], [place], [place] [address] [place], [place] [address]    Report Status PENDING  Incomplete  Culture, blood (Routine x 2)     Status: None (Preliminary result)   Collection Time: 08/26/17 10:12 PM  Result Value Ref Range Status   Specimen Description   Final    BLOOD RIGHT HAND Performed at [facility] [address] [place], [place] [address]    Special Requests   Final    BOTTLES DRAWN AEROBIC ONLY Blood Culture results may not be optimal due to an inadequate volume of blood received in culture bottles Performed at [facility] [address], [place], [place] [address]    Culture   Final    NO GROWTH 3 DAYS Performed at [facility], [place] [address], [place], [place]  [address]    Report Status PENDING  Incomplete  Culture, Urine     Status: Abnormal   Collection Time: 08/27/17  3:25 AM  Result Value Ref Range Status   Specimen Description   Final    URINE, RANDOM Performed at [facility] [address], [place], [place] [address]    Special Requests   Final    NONE Performed at  [facility], [place] [address] [place], [place] [address]    Culture (A)  Final    <10,000 COLONIES/mL INSIGNIFICANT GROWTH Performed at [facility] [address] [place], [place] [address]    Report Status 08/28/2017 FINAL  Final  MRSA PCR Screening     Status: None   Collection Time: 08/27/17  9:10 AM  Result Value Ref Range Status   MRSA by PCR NEGATIVE NEGATIVE Final    Comment:        The GeneXpert MRSA Assay (FDA approved for NASAL specimens only), is one component of a comprehensive MRSA colonization surveillance program. It is not intended to diagnose MRSA infection nor to guide or monitor treatment for MRSA infections. Performed at [facility], [place] [address] [place], [place] [address]      SIGNED:   Cordelia Poche, MD [facility] 08/29/2017, 12:21 PM

## 2017-08-30 NOTE — Progress Notes (Signed)
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      10/10/2014 Miscellaneous    Peripheral blood chemistry and leukocytosis with total white count 78K, comprised of large plasma cells and his normocytic anemia. There is a myeloid left shift with previous surgical radium blasts. Flow cytometry showed 64% plasma cells      10/10/2014 Bone Marrow Biopsy    Markedly hypercellular marrow (95%), Atypical plasma cells comprise 57% of the cellularity. There was diminished multilineage in hematopoiesis with adequate maturation. Breasts less than 1%), no overt dysplasia of the myeloid or erythroid lineages.       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      05/07/2015 - 05/12/2015 Hospital Admission    [facility], [facility]. ID workup was negative, EGD showed evidence of gastritis and duodenitis, no H. pylori or CMV.      05/20/2015 - 05/24/2015  Hospital Admission    [facility]      07/11/2015 Bone Marrow Biopsy    Post transplant 100 a bone marrow biopsy showed hypocellular marrow, 20%, no increase in plasma cells (2%) or other abnormalities.      08/22/2015 - 01/02/2016 Chemotherapy    MaintenanceCyborD (cytoxan 3071mm2 iv, bortezomib 1.5 mg/m, dexamethasone 40 mg, every 2 weeks, changed to Velcade maintenance after 4 months treatment      01/16/2016 - 04/19/2016 Chemotherapy    Maintenance Velcade 1.3 mg/m every 2 weeks      02/22/2016 Pathology Results    BONE MARROW: Diagnosis Bone Marrow, Aspirate,Biopsy, and Clot, right iliac - NORMOCELLULAR BONE MARROW FOR AGE WITH TRILINEAGE HEMATOPOIESIS. - PLASMACYTOSIS (PLASMA CELLS 12%). - SEE COMMENT. PERIPHERAL BLOOD: - OCCASIONAL CIRCULATING PLASMA CELLS.      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       04/18/2016 - 03/26/2017 Chemotherapy    Daratumumab per protocol  CyBorD every week, cytoxan was held after 04/24/2016 dye to cytopenia and infection  -discontinued due to Hep B flare      05/11/2016 - 05/16/2016 Hospital Admission    Healthcare-associated pneumonia      02/16/2017 - 02/21/2017 Hospital Admission    Admission diagnosis: Abnormal LFT's Additional comments: Assoc diagnoses: Hep B flare, transminitis, dehydration, fever, SIRS      04/06/2017 -  Chemotherapy    Carfilzomib 56 mg/m2 on days 1 and 15 (except 2029m2 on C1D1 and C1D2) with dexamethasone 20 mg on same  days and pomalidomide 2 mg on days 1-21 of 28 day cycle         CURRENT THERAPY: carfilzomib 56 mg/m2 on days 1 and 15(except 79m/m2 on C1D1 and C1D2)with dexamethasone 20 mg on same days and pomalidomide 2 mg on days 1-21 of 28 day cycleon 04/13/17, dexa stopped on 07/06/2017 due to her CR   INTERVAL HISTORY: Ms. HMeskereturns for follow up as scheduled. She completed  last cycle 5 day 15 carfilzomib on 08/17/17. She was admitted to the hospital 08/26/17 - 08/28/17 for cough, abdominal, pain, and fever. Chest ray showed findings suspicious for pneumonia in the right lung, MRSA PCR was negative. Started on vanc, cefepime, and azithromycin and transitioned to cefdinir and azithromycin at discharge. Blood culture negative. CT scan with stable hepatic cyst; abd pain felt to be pleuritic pain. Urine culture negative. Pomalyst on hold since hospitalization.   Today she feels weak and tired. She completed oral antibiotics. Cough is improving, productive in the morning then usually dry throughout the day. Remained afebrile since hospital discharge. She continues to have RUQ/lower chest pain when she coughs or breathes deep. Denies dyspnea or chest pain. Her appetite is very low, she has lost some weight. She is dealing with constipation, last BM 5 days ago.   REVIEW OF SYSTEMS:   Constitutional: Denies fevers, chills (+) fatigue (+) decreased appetite (+) weight loss Eyes: Denies blurriness of vision Ears, nose, mouth, throat, and face: Denies mucositis or sore throat Respiratory: Denies dyspnea or wheezes (+) productive cough, improving (+) right side pleuritic pain   Cardiovascular: Denies palpitation, chest discomfort or lower extremity swelling Gastrointestinal:  Denies nausea, vomiting, diarrhea, heartburn or change in bowel habits (+) constipation Skin: Denies abnormal skin rashes Behavioral/Psych: Mood is stable, no new changes  Neuro: (+) generalized weakness   All other systems were reviewed with the patient and are negative.  MEDICAL HISTORY:  Past Medical History:  Diagnosis Date  . Chills with fever    intermittently since d/c from hospital  . Dysuria-frequency syndrome    w/ pink urine  . GERD (gastroesophageal reflux disease)   . Hepatitis   . History of positive PPD    DX 2011--  CXR DONE NO EVIDENCE  . History of ureter stent   . Hydronephrosis,  right   . Neuromuscular disorder ([facility])    legs numb intermittently  . 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   . Pneumonia   . Right ureteral stone   . Urosepsis 8/14   admitted to [facility]    SURGICAL HISTORY: Past Surgical History:  Procedure Laterality Date  . CYSTOSCOPY W/ URETERAL STENT PLACEMENT Right 09/25/2012   Procedure: CYSTOSCOPY WITH RETROGRADE PYELOGRAM/URETERAL STENT PLACEMENT;  Surgeon: TAlexis Frock MD;  Location: [facility];  Service: Urology;  Laterality: Right;  . CYSTOSCOPY WITH RETROGRADE PYELOGRAM, URETEROSCOPY AND STENT PLACEMENT Right 10/15/2012   Procedure: CYSTOSCOPY WITH RETROGRADE PYELOGRAM, URETEROSCOPY AND REMOVAL STENT WITH  STENT PLACEMENT;  Surgeon: TAlexis Frock MD;  [facility]  Service: Urology;  Laterality: Right;  . ESOPHAGOGASTRODUODENOSCOPY (EGD) WITH PROPOFOL N/A 11/16/2014   Procedure: ESOPHAGOGASTRODUODENOSCOPY (EGD) WITH PROPOFOL;  Surgeon: DMilus Banister MD;  Location: [facility];  Service: Endoscopy;  Laterality: N/A;  . HOLMIUM LASER APPLICATION Right 98/85/0277  Procedure: HOLMIUM LASER APPLICATION;  Surgeon: TAlexis Frock MD;  [facility]  Service: Urology;  Laterality: Right;  . LIVER BIOPSY    . OTHER SURGICAL HISTORY Right    removal  of ovarian cyst  . removal of uterine cyst     years ago  . RIGHT VATS W/ DRAINAGE PEURAL EFFUSION AND BX'S  10-30-2008    I have reviewed the social history and family history with the patient and they are unchanged from previous note.  ALLERGIES:  has No Known Allergies.  MEDICATIONS:  Current Outpatient Medications  Medication Sig Dispense Refill  . acyclovir (ZOVIRAX) 800 MG tablet Take 1 tablet (800 mg total) by mouth 2 (two) times daily. 60 tablet 5  . azithromycin (ZITHROMAX) 500 MG tablet Take 1 tablet (500 mg total) by mouth daily for 2 days. Take 1 tablet daily for 3 days. 2 tablet 0  . cefdinir (OMNICEF) 300 MG capsule Take 1 capsule (300 mg  total) by mouth 2 (two) times daily for 4 days. 8 capsule 0  . chlorpheniramine-HYDROcodone (TUSSIONEX) 10-8 MG/5ML SUER Take 5 mLs by mouth every 12 (twelve) hours as needed for cough. 140 mL 0  . feeding supplement, ENSURE ENLIVE, (ENSURE ENLIVE) LIQD Take 237 mLs by mouth 2 (two) times daily between meals. 237 mL 12  . ibuprofen (ADVIL,MOTRIN) 200 MG tablet Take 200 mg by mouth every 6 (six) hours as needed for moderate pain.    . magic mouthwash SOLN Take 5 mLs by mouth 4 (four) times daily as needed for mouth pain. 240 mL 3  . omeprazole (PRILOSEC) 20 MG capsule Take 1 capsule (20 mg total) by mouth daily. 30 capsule 5  . ondansetron (ZOFRAN) 8 MG tablet Take 1 tablet (8 mg total) by mouth every 8 (eight) hours as needed for nausea or vomiting. 30 tablet 1  . polyethylene glycol (MIRALAX / GLYCOLAX) packet Take 17 g by mouth daily as needed (constipation). 14 each 1  . potassium chloride SA (K-DUR,KLOR-CON) 20 MEQ tablet Take 1 tablet (20 mEq total) by mouth daily. 30 tablet 0  . prochlorperazine (COMPAZINE) 10 MG tablet Take 1 tablet (10 mg total) by mouth every 6 (six) hours as needed for nausea or vomiting. 30 tablet 3  . promethazine (PHENERGAN) 25 MG tablet Take 1 tablet (25 mg total) by mouth every 6 (six) hours as needed for nausea or vomiting. 30 tablet 0  . ursodiol (ACTIGALL) 300 MG capsule Take 1 capsule (300 mg total) by mouth 2 (two) times daily. 60 capsule 0  . VIREAD 300 MG tablet Take 1 tablet (300 mg total) by mouth daily. 30 tablet 11  . mirtazapine (REMERON) 7.5 MG tablet Take 1 tablet (7.5 mg total) by mouth at bedtime. 30 tablet 1   No current facility-administered medications for this visit.    Facility-Administered Medications Ordered in Other Visits  Medication Dose Route Frequency Provider Last Rate Last Dose  . sodium chloride 0.9 % injection 10 mL  10 mL Intravenous PRN Truitt Merle, MD   10 mL at 12/11/15 1532  . sodium chloride 0.9 % injection 10 mL  10 mL  Intravenous PRN Truitt Merle, MD   10 mL at 06/11/16 1505  . sodium chloride 0.9 % injection 10 mL  10 mL Intravenous PRN Truitt Merle, MD   10 mL at 08/04/17 1643  . sodium chloride flush (NS) 0.9 % injection 10 mL  10 mL Intravenous PRN Truitt Merle, MD   10 mL at 10/09/15 1717    PHYSICAL EXAMINATION: ECOG PERFORMANCE STATUS: 3 - Symptomatic, >50% confined to bed  Vitals:   08/31/17 1156  BP: 103/74  Pulse: 85  Resp: 18  Temp: 98.2 F (  36.8 C)  SpO2: 100%   Filed Weights   08/31/17 1156  Weight: 127 lb 8 oz (57.8 kg)    GENERAL:alert, no distress and comfortable SKIN: no rashes or significant lesions EYES: sclera clear OROPHARYNX:no thrush or ulcers LYMPH:  no palpable cervical or supraclavicular lymphadenopathy LUNGS: clear to auscultation with normal breathing effort HEART: regular rate & rhythm and no murmurs and no lower extremity edema ABDOMEN:abdomen soft, non-tender and normal bowel sounds NEURO: alert & oriented x 3 with fluent speech, no focal motor deficits PAC without erythema   LABORATORY DATA:  I have reviewed the data as listed CBC Latest Ref Rng & Units 08/31/2017 08/29/2017 08/28/2017  WBC 3.9 - 10.3 K/uL 3.0(L) 5.0 5.4  Hemoglobin 11.6 - 15.9 g/dL 9.4(L) 9.0(L) 8.7(L)  Hematocrit 34.8 - 46.6 % 31.0(L) 29.4(L) 28.4(L)  Platelets 145 - 400 K/uL 191 163 114(L)     CMP Latest Ref Rng & Units 08/31/2017 08/28/2017 08/27/2017  Glucose 70 - 99 mg/dL 108(H) 84 68(L)  BUN 6 - 20 mg/dL '10 7 11  ' Creatinine 0.44 - 1.00 mg/dL 0.74 0.60 0.87  Sodium 135 - 145 mmol/L 139 140 144  Potassium 3.5 - 5.1 mmol/L 3.8 3.7 4.7  Chloride 98 - 111 mmol/L 107 112(H) 115(H)  CO2 22 - 32 mmol/L '27 23 22  ' Calcium 8.9 - 10.3 mg/dL 9.5 7.8(L) 8.1(L)  Total Protein 6.5 - 8.1 g/dL 7.5 - -  Total Bilirubin 0.3 - 1.2 mg/dL 0.3 - -  Alkaline Phos 38 - 126 U/L 64 - -  AST 15 - 41 U/L 16 - -  ALT 0 - 44 U/L 7 - -   SPEP M-protein  09/15/2014: 4.2 10/08/14: 4.6 12/08/2014: 0.2 02/15/2015: not  sufficient sample for test  09/11/2015: not observed 10/09/2015: not det  11/06/2015: not det  12/25/2015: not det  02/13/2016: 0.3 03/12/2016: 0.8 04/09/2016: 0.6 05/08/16: 0.1 06/04/16: 0.1 07/02/2016: 0.2 07/09/2016: 0.2 (dara specific IFE negative) 08/05/16: Not observed  09/01/16: Not observed 09/29/16: 0.1(dara specific IFE negative) 10/27/16: Not observed 11/24/16 : Not observed 12/23/16: 0.1 (Dara specific IFE showed IgG monoclonal protein with lambda light chain) 01/21/2017: 0.1 (Dara specific IFE showed IgG monoclonal protein with lambda light chain) 03/26/17: 0.2 04/27/17: 0.1 05/25/17: Not observed 06/23/17: Not observed 07/20/17: Not observed 08/03/17: not observed  08/17/17: not observed    IgG mg/dl  11/03/2014: 3150  12/08/2014: 759 02/14/2014: 860 09/11/2015: 1347 10/09/2015: 1457 11/06/2015: 1504 12/25/2015: 1400 02/13/2016: 1543 03/12/2016: 1860 04/09/2016: 1620 05/08/16: 749 06/04/16: 751 5/30/20187: 796 07/09/2016: 701 08/05/16: 678 09/01/16: 650 09/29/16: 727 10/27/16: 634 11/24/16: 685  12/23/16: 714 01/21/2017: 709 03/26/17: 874 04/27/17: 1067 05/25/17: 1161 06/23/17: 1211 07/20/17: 1124   Kappa/lambda light chains levels and ratio 11/03/14: 0.75, 152, 0.00 12/22/2014: 1.10, 2.60, 0.42 02/15/2015: 9.59,14.35, 0.67 09/11/2015: 2.44, 2.72, 0.90 10/09/2015: 3.09, 3.49, 0.89 11/06/2015: 2.30, 2.62, 0.88 12/25/2015: 2.51, 3.0, 0.84 02/13/2016: 2.64, 15.3, 0.17 03/12/2016: 2.27, 45.5, 0.05 04/09/2016: 3.33, 45.5, 0.07 05/08/2016: 0.49, 1.21, 0.40 06/04/2016: 0.83, 1.11, 0.75 07/02/16: 0.51, 1.43, 0.36 07/23/16: 0.68, 1.02, 0.67 08/05/16: 0.62, 1.09, 0.57 09/01/16: 0.80, 1.21, 0.66 09/29/16: 0.44, 1.07, 0.41 10/27/16: 0.63, 1.44, 0.44 11/24/16: 0.83, 2.13, 0.39 12/23/16: 0.69, 2.31, 0.30 01/21/2017: 0.77, 3.08, 0.25   24 h urine UPEP/IFE and light chain: 11/03/2014: IFE showed a monoclonal IgG heavy chain with associated lambda light chain. M protein was Undetectable    PATHOLOGY     PATHOLOGY REPORT  Bone Marrow (BM) and Peripheral Blood (PB) FINAL PATHOLOGIC DIAGNOSIS  BONE MARROW: 07/11/2015 Hypocellular marrow (20%) with no increase in plasma cells (2%). See comment. PERIPHERAL BLOOD: Mild anemia. No circulating plasma cells identified. See comment and CBC data.  BONE MARROW: 02/22/2016 Diagnosis Bone Marrow, Aspirate,Biopsy, and Clot, right iliac - NORMOCELLULAR BONE MARROW FOR AGE WITH TRILINEAGE HEMATOPOIESIS. - PLASMACYTOSIS (PLASMA CELLS 12%). - SEE COMMENT. PERIPHERAL BLOOD: - OCCASIONAL CIRCULATING PLASMA CELLS.   PROCEDURES  Bone Marrow Biopsy, 01/21/17 Bone Marrow, Aspirate, Biopsy, and Clot - NORMOCELLULAR BONE MARROW FOR AGE WITH TRILINEAGE HEMATOPOIESIS AND 2% PLASMA CELLS. PERIPHERAL BLOOD: - NORMOCYTIC-HYPOCHROMIC ANEMIA.    RADIOGRAPHIC STUDIES: I have personally reviewed the radiological images as listed and agreed with the findings in the report. No results found.   ASSESSMENT & PLAN: 47 y.o.[place] woman, presented with anemia and leokocytosis   1. Pneumonia, hospitalization 7/24 - 7/26 2. 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 transaminitis and hyperbilirubinemia, secondary to hepatitis B flare 4. 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 GERD, h/o GI bleeding and nausea 6. Constipation  7. Cough  8. Anorexia with weight loss   Ms. Upshaw appears stable. She was recently hospitalized for community acquired pneumonia, discharged on 7/26. She has not completely recovered from hospitalization. She remains weak and tired, she has lost some weight. I recommend appetite stimulant such as mirtazapine, and to increase nutrition supplement with ensure. After review of potential side effects, she agrees to try. I prescribed today. Her respiratory symptoms are improved, she completed antibiotics. CBC reviewed, she is mildly neutropenic with ANC 1.2. CMP is  unremarkable. SPEP on 7/15 with undetectable M protein, today's panel is pending. I recommend to hold chemo for 1 week, she will return then to resume pomalidomide and carfilzomib. Will support with IVF today. A vietnamese video interpreter was used during today's visit. The patient agrees with the plan.   PLAN: -Labs reviewed -IVF only today -Hold chemotherapy carfilzomib and pomalidomide for 1 week  -Lab, f/u, carfilzomib in 1 and 2 weeks  -Rx: mirtazapine 7.5 mg po qHS for anorexia and weight loss -Refilled miralax for constipation   All questions were answered. The patient knows to call the clinic with any problems, questions or concerns. No barriers to learning was detected. I spent 20 minutes counseling the patient face to face. The total time spent in the appointment was 25 minutes and more than 50% was on counseling and review of test results     Alla Feeling, NP 08/31/17

## 2017-08-31 ENCOUNTER — Inpatient Hospital Stay: Payer: Medicaid Other

## 2017-08-31 ENCOUNTER — Ambulatory Visit: Payer: Medicaid Other

## 2017-08-31 ENCOUNTER — Encounter: Payer: Self-pay | Admitting: Nurse Practitioner

## 2017-08-31 ENCOUNTER — Inpatient Hospital Stay (HOSPITAL_BASED_OUTPATIENT_CLINIC_OR_DEPARTMENT_OTHER): Payer: Medicaid Other | Admitting: Nurse Practitioner

## 2017-08-31 ENCOUNTER — Telehealth: Payer: Self-pay | Admitting: Nurse Practitioner

## 2017-08-31 VITALS — BP 103/74 | HR 85 | Temp 98.2°F | Resp 18 | Ht 62.99 in | Wt 127.5 lb

## 2017-08-31 DIAGNOSIS — R112 Nausea with vomiting, unspecified: Secondary | ICD-10-CM | POA: Diagnosis not present

## 2017-08-31 DIAGNOSIS — K1379 Other lesions of oral mucosa: Secondary | ICD-10-CM | POA: Diagnosis not present

## 2017-08-31 DIAGNOSIS — Z5112 Encounter for antineoplastic immunotherapy: Secondary | ICD-10-CM | POA: Diagnosis not present

## 2017-08-31 DIAGNOSIS — C9000 Multiple myeloma not having achieved remission: Secondary | ICD-10-CM

## 2017-08-31 DIAGNOSIS — R5383 Other fatigue: Secondary | ICD-10-CM | POA: Diagnosis not present

## 2017-08-31 DIAGNOSIS — D709 Neutropenia, unspecified: Secondary | ICD-10-CM

## 2017-08-31 DIAGNOSIS — R630 Anorexia: Secondary | ICD-10-CM

## 2017-08-31 DIAGNOSIS — R634 Abnormal weight loss: Secondary | ICD-10-CM

## 2017-08-31 DIAGNOSIS — C9012 Plasma cell leukemia in relapse: Secondary | ICD-10-CM

## 2017-08-31 DIAGNOSIS — Z9221 Personal history of antineoplastic chemotherapy: Secondary | ICD-10-CM | POA: Diagnosis not present

## 2017-08-31 DIAGNOSIS — Z9484 Stem cells transplant status: Secondary | ICD-10-CM | POA: Diagnosis not present

## 2017-08-31 DIAGNOSIS — K219 Gastro-esophageal reflux disease without esophagitis: Secondary | ICD-10-CM | POA: Diagnosis not present

## 2017-08-31 DIAGNOSIS — E099 Drug or chemical induced diabetes mellitus without complications: Secondary | ICD-10-CM | POA: Diagnosis not present

## 2017-08-31 DIAGNOSIS — R531 Weakness: Secondary | ICD-10-CM | POA: Diagnosis not present

## 2017-08-31 DIAGNOSIS — Z95828 Presence of other vascular implants and grafts: Secondary | ICD-10-CM

## 2017-08-31 DIAGNOSIS — K5900 Constipation, unspecified: Secondary | ICD-10-CM

## 2017-08-31 DIAGNOSIS — C9010 Plasma cell leukemia not having achieved remission: Secondary | ICD-10-CM

## 2017-08-31 DIAGNOSIS — R740 Nonspecific elevation of levels of transaminase and lactic acid dehydrogenase [LDH]: Secondary | ICD-10-CM

## 2017-08-31 DIAGNOSIS — B1910 Unspecified viral hepatitis B without hepatic coma: Secondary | ICD-10-CM

## 2017-08-31 DIAGNOSIS — E860 Dehydration: Secondary | ICD-10-CM

## 2017-08-31 DIAGNOSIS — J189 Pneumonia, unspecified organism: Secondary | ICD-10-CM

## 2017-08-31 DIAGNOSIS — R05 Cough: Secondary | ICD-10-CM

## 2017-08-31 LAB — CBC WITH DIFFERENTIAL (CANCER CENTER ONLY)
Basophils Absolute: 0 10*3/uL (ref 0.0–0.1)
Basophils Relative: 1 %
Eosinophils Absolute: 0.2 10*3/uL (ref 0.0–0.5)
Eosinophils Relative: 5 %
HEMATOCRIT: 31 % — AB (ref 34.8–46.6)
HEMOGLOBIN: 9.4 g/dL — AB (ref 11.6–15.9)
LYMPHS ABS: 1 10*3/uL (ref 0.9–3.3)
Lymphocytes Relative: 33 %
MCH: 26.6 pg (ref 25.1–34.0)
MCHC: 30.3 g/dL — AB (ref 31.5–36.0)
MCV: 87.6 fL (ref 79.5–101.0)
MONO ABS: 0.7 10*3/uL (ref 0.1–0.9)
MONOS PCT: 22 %
NEUTROS ABS: 1.2 10*3/uL — AB (ref 1.5–6.5)
NEUTROS PCT: 39 %
Platelet Count: 191 10*3/uL (ref 145–400)
RBC: 3.54 MIL/uL — ABNORMAL LOW (ref 3.70–5.45)
RDW: 16.6 % — AB (ref 11.2–14.5)
WBC Count: 3 10*3/uL — ABNORMAL LOW (ref 3.9–10.3)

## 2017-08-31 LAB — CULTURE, BLOOD (ROUTINE X 2)
CULTURE: NO GROWTH
Culture: NO GROWTH

## 2017-08-31 LAB — CMP (CANCER CENTER ONLY)
ALBUMIN: 3.3 g/dL — AB (ref 3.5–5.0)
ALK PHOS: 64 U/L (ref 38–126)
ALT: 7 U/L (ref 0–44)
ANION GAP: 5 (ref 5–15)
AST: 16 U/L (ref 15–41)
BUN: 10 mg/dL (ref 6–20)
CALCIUM: 9.5 mg/dL (ref 8.9–10.3)
CO2: 27 mmol/L (ref 22–32)
Chloride: 107 mmol/L (ref 98–111)
Creatinine: 0.74 mg/dL (ref 0.44–1.00)
GFR, Est AFR Am: >60 mL/min (ref >60)
GFR, Estimated: >60 mL/min (ref >60)
GLUCOSE: 108 mg/dL — AB (ref 70–99)
POTASSIUM: 3.8 mmol/L (ref 3.5–5.1)
Sodium: 139 mmol/L (ref 135–145)
Total Bilirubin: 0.3 mg/dL (ref 0.3–1.2)
Total Protein: 7.5 g/dL (ref 6.5–8.1)

## 2017-08-31 MED ORDER — SODIUM CHLORIDE 0.9 % IJ SOLN
10.0000 mL | INTRAMUSCULAR | Status: DC | PRN
  Administered 2017-08-31: 10 mL via INTRAVENOUS
  Filled 2017-08-31: qty 10

## 2017-08-31 MED ORDER — SODIUM CHLORIDE 0.9 % IV SOLN
Freq: Once | INTRAVENOUS | Status: AC
  Administered 2017-08-31: 13:00:00 via INTRAVENOUS
  Filled 2017-08-31: qty 250

## 2017-08-31 MED ORDER — POLYETHYLENE GLYCOL 3350 17 G PO PACK: 17.0000 g | PACK | Freq: Every day | ORAL | 1 refills | Status: AC | PRN

## 2017-08-31 MED ORDER — HEPARIN SOD (PORK) LOCK FLUSH 100 UNIT/ML IV SOLN
500.0000 [IU] | Freq: Once | INTRAVENOUS | Status: AC
  Administered 2017-08-31: 500 [IU] via INTRAVENOUS
  Filled 2017-08-31: qty 5

## 2017-08-31 MED ORDER — MIRTAZAPINE 7.5 MG PO TABS: 7.5000 mg | ORAL_TABLET | Freq: Every day | ORAL | 1 refills | Status: DC

## 2017-08-31 NOTE — Patient Instructions (Signed)
Dehydration, Adult Dehydration is when there is not enough fluid or water in your body. This happens when you lose more fluids than you take in. Dehydration can range from mild to very bad. It should be treated right away to keep it from getting very bad. Symptoms of mild dehydration may include:  Thirst.  Dry lips.  Slightly dry mouth.  Dry, warm skin.  Dizziness. Symptoms of moderate dehydration may include:  Very dry mouth.  Muscle cramps.  Dark pee (urine). Pee may be the color of tea.  Your body making less pee.  Your eyes making fewer tears.  Heartbeat that is uneven or faster than normal (palpitations).  Headache.  Light-headedness, especially when you stand up from sitting.  Fainting (syncope). Symptoms of very bad dehydration may include:  Changes in skin, such as: ? Cold and clammy skin. ? Blotchy (mottled) or pale skin. ? Skin that does not quickly return to normal after being lightly pinched and let go (poor skin turgor).  Changes in body fluids, such as: ? Feeling very thirsty. ? Your eyes making fewer tears. ? Not sweating when body temperature is high, such as in hot weather. ? Your body making very little pee.  Changes in vital signs, such as: ? Weak pulse. ? Pulse that is more than 100 beats a minute when you are sitting still. ? Fast breathing. ? Low blood pressure.  Other changes, such as: ? Sunken eyes. ? Cold hands and feet. ? Confusion. ? Lack of energy (lethargy). ? Trouble waking up from sleep. ? Short-term weight loss. ? Unconsciousness. Follow these instructions at home:  If told by your doctor, drink an ORS: ? Make an ORS by using instructions on the package. ? Start by drinking small amounts, about  cup (120 mL) every 5-10 minutes. ? Slowly drink more until you have had the amount that your doctor said to have.  Drink enough clear fluid to keep your pee clear or pale yellow. If you were told to drink an ORS, finish the ORS  first, then start slowly drinking clear fluids. Drink fluids such as: ? Water. Do not drink only water by itself. Doing that can make the salt (sodium) level in your body get too low (hyponatremia). ? Ice chips. ? Fruit juice that you have added water to (diluted). ? Low-calorie sports drinks.  Avoid: ? Alcohol. ? Drinks that have a lot of sugar. These include high-calorie sports drinks, fruit juice that does not have water added, and soda. ? Caffeine. ? Foods that are greasy or have a lot of fat or sugar.  Take over-the-counter and prescription medicines only as told by your doctor.  Do not take salt tablets. Doing that can make the salt level in your body get too high (hypernatremia).  Eat foods that have minerals (electrolytes). Examples include bananas, oranges, potatoes, tomatoes, and spinach.  Keep all follow-up visits as told by your doctor. This is important. Contact a doctor if:  You have belly (abdominal) pain that: ? Gets worse. ? Stays in one area (localizes).  You have a rash.  You have a stiff neck.  You get angry or annoyed more easily than normal (irritability).  You are more sleepy than normal.  You have a harder time waking up than normal.  You feel: ? Weak. ? Dizzy. ? Very thirsty.  You have peed (urinated) only a small amount of very dark pee during 6-8 hours. Get help right away if:  You have symptoms of   very bad dehydration.  You cannot drink fluids without throwing up (vomiting).  Your symptoms get worse with treatment.  You have a fever.  You have a very bad headache.  You are throwing up or having watery poop (diarrhea) and it: ? Gets worse. ? Does not go away.  You have blood or something green (bile) in your throw-up.  You have blood in your poop (stool). This may cause poop to look black and tarry.  You have not peed in 6-8 hours.  You pass out (faint).  Your heart rate when you are sitting still is more than 100 beats a  minute.  You have trouble breathing. This information is not intended to replace advice given to you by your health care provider. Make sure you discuss any questions you have with your health care provider. Document Released: 11/16/2008 Document Revised: 08/10/2015 Document Reviewed: 03/16/2015 Elsevier Interactive Patient Education  2018 [employer]  

## 2017-08-31 NOTE — Telephone Encounter (Signed)
Scheduled appt per 7/29 los - gave patient AVS and calender per los.  

## 2017-09-01 ENCOUNTER — Other Ambulatory Visit: Payer: Self-pay | Admitting: Hematology

## 2017-09-01 ENCOUNTER — Other Ambulatory Visit: Payer: Self-pay

## 2017-09-01 DIAGNOSIS — C9012 Plasma cell leukemia in relapse: Secondary | ICD-10-CM

## 2017-09-01 MED ORDER — POMALIDOMIDE 2 MG PO CAPS: 2.0000 mg | ORAL_CAPSULE | Freq: Every day | ORAL | 0 refills | Status: DC

## 2017-09-01 NOTE — Progress Notes (Signed)
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      10/10/2014 Miscellaneous    Peripheral blood chemistry and leukocytosis with total white count 78K, comprised of large plasma cells and his normocytic anemia. There is a myeloid left shift with previous surgical radium blasts. Flow cytometry showed 64% plasma cells      10/10/2014 Bone Marrow Biopsy    Markedly hypercellular marrow (95%), Atypical plasma cells comprise 57% of the cellularity. There was diminished multilineage in hematopoiesis with adequate maturation. Breasts less than 1%), no overt dysplasia of the myeloid or erythroid lineages.       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      05/07/2015 - 05/12/2015 Hospital Admission    [facility], [facility] ID workup was negative, EGD showed evidence of gastritis and  duodenitis, no H. pylori or CMV.      05/20/2015 - 05/24/2015 Hospital Admission    [facility]      07/11/2015 Bone Marrow Biopsy    Post transplant 100 a bone marrow biopsy showed hypocellular marrow, 20%, no increase in plasma cells (2%) or other abnormalities.      08/22/2015 - 01/02/2016 Chemotherapy    MaintenanceCyborD (cytoxan 3042mm2 iv, bortezomib 1.5 mg/m, dexamethasone 40 mg, every 2 weeks, changed to Velcade maintenance after 4 months treatment      01/16/2016 - 04/19/2016 Chemotherapy    Maintenance Velcade 1.3 mg/m every 2 weeks      02/22/2016 Pathology Results    BONE MARROW: Diagnosis Bone Marrow, Aspirate,Biopsy, and Clot, right iliac - NORMOCELLULAR BONE MARROW FOR AGE WITH TRILINEAGE HEMATOPOIESIS. - PLASMACYTOSIS (PLASMA CELLS 12%). - SEE COMMENT. PERIPHERAL BLOOD: - OCCASIONAL CIRCULATING PLASMA CELLS.      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       04/18/2016 - 03/26/2017 Chemotherapy    Daratumumab per protocol  CyBorD every week, cytoxan was held after 04/24/2016 dye to cytopenia and infection  -discontinued due to Hep B flare      05/11/2016 - 05/16/2016 Hospital Admission    Healthcare-associated pneumonia      02/16/2017 - 02/21/2017 Hospital Admission    Admission diagnosis: Abnormal LFT's Additional comments: Assoc diagnoses: Hep B flare, transminitis, dehydration, fever, SIRS      04/06/2017 -  Chemotherapy    Carfilzomib 56 mg/m2 on days  1 and 15 (except 80m/m2 on C1D1 and C1D2) with dexamethasone 20 mg on same days and pomalidomide 2 mg on days 1-21 of 28 day cycle          08/26/2017 - 08/28/2017 Hospital Admission    Admit date: 08/26/2017 Admission diagnosis: RLL Pneumonia        HISTORY OF PRESENTING ILLNESS:  Sheryl [name] 47y.o. female is here because of recently diagnosed plasma cell leukemia. [facility] and is here to establish her local oncological care with [place] she is accompanied to the clinic by her daughter and interpreter.  She presented to our hospital with worsening dyspnea, fatigue, cough, subjective fevers and chills, and was admitted on 09/14/2014. She was found to have allergy WBC 54.1K, hemoglobin 8.1, plt 310, she was treated with symptom management, and was discharged home on 09/17/2014, with a plan to follow-up with hematology. She represents to emergency room on 10/07/2014, was found to have white count of 78K, and worsening anemia with hemoglobin 6. She was seen by my partner Dr. EJonette Evaand plasma cell leukemia was suspected. [facility] Bone marrow biopsy was done, which confirmed plasma cell leukemia, and she started chemotherapy with Velcade, Cytoxan and dexamethasone. She received weekly dose twice, last dose on 10/20/2014. She was discharged to home afterwards. She had a mediport placed during her hospitalization.  She has moderate fatigue, low appetite, she lost about 13 lbs in the past few months. She has moderate abdominal pain, 5-6/10, persistent, she does not take any pain meds.   I reviewed her medical records extensively, and discussed her case with Dr. EJorje [place]and [facility].  CURRENT THERAPY:   carfilzomib 56 mg/m2 on days 1 and 15(except 239mm2 on C1D1 and C1D2)with dexamethasone 20 mg on same days and pomalidomide 2 mg on days 1-21 of 28 day cycleon 04/13/17, dexa stopped on 07/06/2017 due to her CR    INTERIM HISTORY:   Sheryl [name] is here for follow up of her acute plasma cell Leukemia. She was admitted to the hospital 08/26/17 - 08/28/17 for cough, abdominal, pain, and fever. She was diagnosed with RLL Pneumonia and discharged with antibiotics.   She presents today with her  interpreter. She is feeling better, but still complains of appetite loss and low energy. She is not taking Pamalyst which is still on hold. She is not taking nausea or appetite medications because she is improving and she doesn't want any. She didn't take her Dexa today.  She feels her energy and appetite are 70% back.  She went back to work for 2 days last week.  She also complains of constipation for which she uses colace and miralax twice daily.     MEDICAL HISTORY:  Past Medical History:  Diagnosis Date  . Chills with fever    intermittently since d/c from hospital  . Dysuria-frequency syndrome    w/ pink urine  . GERD (gastroesophageal reflux disease)   . Hepatitis   . History of positive PPD    DX 2011--  CXR DONE NO EVIDENCE  . History of ureter stent   . Hydronephrosis, right   . Neuromuscular disorder ([facility])    legs numb intermittently  . 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[place]  . Pneumonia   . Right ureteral stone   . Urosepsis 8/14   admitted  to [facility]    SURGICAL HISTORY: Past Surgical History:  Procedure Laterality Date  . CYSTOSCOPY W/ URETERAL STENT PLACEMENT Right 09/25/2012   Procedure: CYSTOSCOPY WITH RETROGRADE PYELOGRAM/URETERAL STENT PLACEMENT;  Surgeon: Alexis Frock, MD;  Location: [facility];  Service: Urology;  Laterality: Right;  . CYSTOSCOPY WITH RETROGRADE PYELOGRAM, URETEROSCOPY AND STENT PLACEMENT Right 10/15/2012   Procedure: CYSTOSCOPY WITH RETROGRADE PYELOGRAM, URETEROSCOPY AND REMOVAL STENT WITH  STENT PLACEMENT;  Surgeon: Alexis Frock, MD;  Location: [facility];  Service: Urology;  Laterality: Right;  . ESOPHAGOGASTRODUODENOSCOPY (EGD) WITH PROPOFOL N/A 11/16/2014   Procedure: ESOPHAGOGASTRODUODENOSCOPY (EGD) WITH PROPOFOL;  Surgeon: Milus Banister, MD;  Location: [facility];  Service: Endoscopy;  Laterality: N/A;  . HOLMIUM LASER APPLICATION Right 6/83/4196   Procedure: HOLMIUM LASER APPLICATION;  Surgeon: Alexis Frock, MD;  Location:  [facility];  Service: Urology;  Laterality: Right;  . LIVER BIOPSY    . OTHER SURGICAL HISTORY Right    removal of ovarian cyst  . removal of uterine cyst     years ago  . RIGHT VATS W/ DRAINAGE PEURAL EFFUSION AND BX'S  10-30-2008    SOCIAL HISTORY: Social History   Socioeconomic History  . Marital status: Single    Spouse name: Not on file  . Number of children: 3  . Years of education: Not on file  . Highest education level: Not on file  Occupational History  . Not on file  Social Needs  . Financial resource strain: Not on file  . Food insecurity:    Worry: Not on file    Inability: Not on file  . Transportation needs:    Medical: Not on file    Non-medical: Not on file  Tobacco Use  . Smoking status: Never Smoker  . Smokeless tobacco: Never Used  Substance and Sexual Activity  . Alcohol use: No    Alcohol/week: 0.0 oz  . Drug use: No  . Sexual activity: Not Currently    Birth control/protection: Abstinence  Lifestyle  . Physical activity:    Days per week: Not on file    Minutes per session: Not on file  . Stress: Not on file  Relationships  . Social connections:    Talks on phone: Not on file    Gets together: Not on file    Attends religious service: Not on file    Active member of club or organization: Not on file    Attends meetings of clubs or organizations: Not on file    Relationship status: Not on file  Other Topics Concern  . Not on file  Social History Narrative  . Not on file    FAMILY HISTORY: Family History  Problem Relation Age of Onset  . Stomach cancer Mother   . Lung disease Father   . Asthma Father     ALLERGIES:  has No Known Allergies.  MEDICATIONS:  Current Outpatient Medications  Medication Sig Dispense Refill  . acyclovir (ZOVIRAX) 800 MG tablet Take 1 tablet (800 mg total) by mouth 2 (two) times daily. 60 tablet 5  . chlorpheniramine-HYDROcodone (TUSSIONEX) 10-8 MG/5ML SUER Take 5 mLs by mouth every 12  (twelve) hours as needed for cough. 140 mL 0  . feeding supplement, ENSURE ENLIVE, (ENSURE ENLIVE) LIQD Take 237 mLs by mouth 2 (two) times daily between meals. 237 mL 12  . ibuprofen (ADVIL,MOTRIN) 200 MG tablet Take 200 mg by mouth every 6 (six) hours as needed for moderate pain.    Marland Kitchen  magic mouthwash SOLN Take 5 mLs by mouth 4 (four) times daily as needed for mouth pain. 240 mL 3  . omeprazole (PRILOSEC) 20 MG capsule Take 1 capsule (20 mg total) by mouth daily. 30 capsule 5  . ondansetron (ZOFRAN) 8 MG tablet Take 1 tablet (8 mg total) by mouth every 8 (eight) hours as needed for nausea or vomiting. 30 tablet 1  . polyethylene glycol (MIRALAX / GLYCOLAX) packet Take 17 g by mouth daily as needed (constipation). 14 each 1  . pomalidomide (POMALYST) 2 MG capsule Take 1 capsule (2 mg total) by mouth daily. Take with water on days 1-21. Repeat every 28 days. 21 capsule 0  . potassium chloride SA (K-DUR,KLOR-CON) 20 MEQ tablet Take 1 tablet (20 mEq total) by mouth daily. 30 tablet 0  . prochlorperazine (COMPAZINE) 10 MG tablet Take 1 tablet (10 mg total) by mouth every 6 (six) hours as needed for nausea or vomiting. 30 tablet 3  . promethazine (PHENERGAN) 25 MG tablet Take 1 tablet (25 mg total) by mouth every 6 (six) hours as needed for nausea or vomiting. 30 tablet 0  . ursodiol (ACTIGALL) 300 MG capsule Take 1 capsule (300 mg total) by mouth 2 (two) times daily. 60 capsule 0  . VIREAD 300 MG tablet Take 1 tablet (300 mg total) by mouth daily. 30 tablet 11   No current facility-administered medications for this visit.    Facility-Administered Medications Ordered in Other Visits  Medication Dose Route Frequency Provider Last Rate Last Dose  . carfilzomib (KYPROLIS) 90 mg in dextrose 5 % 100 mL chemo infusion  56 mg/m2 (Treatment Plan Recorded) Intravenous Once Truitt Merle, MD      . heparin lock flush 100 unit/mL  500 Units Intracatheter Once PRN Truitt Merle, MD      . sodium chloride 0.9 % injection  10 mL  10 mL Intravenous PRN Truitt Merle, MD   10 mL at 12/11/15 1532  . sodium chloride 0.9 % injection 10 mL  10 mL Intravenous PRN Truitt Merle, MD   10 mL at 06/11/16 1505  . sodium chloride 0.9 % injection 10 mL  10 mL Intravenous PRN Truitt Merle, MD   10 mL at 08/04/17 1643  . sodium chloride flush (NS) 0.9 % injection 10 mL  10 mL Intravenous PRN Truitt Merle, MD   10 mL at 10/09/15 1717  . sodium chloride flush (NS) 0.9 % injection 10 mL  10 mL Intracatheter PRN Truitt Merle, MD        REVIEW OF SYSTEMS:   Constitutional: no abnormal night sweats (+) good appetite, energy improved  Eyes: Denies blurriness of vision, double vision or watery eyes.  Ears, nose, mouth, throat, and face: Denies mucositis  Respiratory: Denies, dyspnea or wheezes  Cardiovascular: Denies palpitation, chest discomfort or lower extremity swelling Gastrointestinal:  Denies nausea, heartburn, reports regular bowel/bladder (+) constipation GU: WNLs Skin: Denies abnormal skin rashes Musculoskeletal: WNLs Lymphatics: Denies new lymphadenopathy or easy bruising Neurological:Denies numbness, tingling or new weaknesses Behavioral/Psych: Mood is stable, no new changes  All other systems were reviewed with the patient and are negative.  PHYSICAL EXAMINATION:   ECOG PERFORMANCE STATUS: 1-2  Vitals:   09/07/17 0851  BP: 106/73  Pulse: 75  Resp: 17  Temp: 98.2 F (36.8 C)  TempSrc: Oral  SpO2: 100%  Weight: 130 lb (59 kg)  Height: _0  (1.575 m)    GENERAL: She was wrapped with multiple blankets, not in respiratory distress. SKIN:  skin color, texture, turgor are normal, no rashes or significant lesions EYES: normal, conjunctiva are pink and non-injected OROPHARYNX:no exudate, no erythema and lips, buccal mucosa NECK: supple, thyroid normal size, non-tender, without nodularity LYMPH:  no palpable lymphadenopathy in the cervical, axillary or inguinal LUNGS: (+) bilateral crackles on the base of her lungs HEART:  regular rate & rhythm and no murmurs and no lower extremity edema ABDOMEN:abdomen soft, moderate tenderness in the right upper quadrant of abdomen, no rebound pain, no Murphy sign, no hepatomegaly. Musculoskeletal:no cyanosis of digits and no clubbing  PSYCH: alert & oriented x 3 with fluent speech NEURO: no focal motor/sensory deficits  LABORATORY DATA:  I have reviewed the data as listed CBC Latest Ref Rng & Units 09/07/2017 08/31/2017 08/29/2017  WBC 3.9 - 10.3 K/uL 2.7(L) 3.0(L) 5.0  Hemoglobin 11.6 - 15.9 g/dL 9.7(L) 9.4(L) 9.0(L)  Hematocrit 34.8 - 46.6 % 29.9(L) 31.0(L) 29.4(L)  Platelets 145 - 400 K/uL 316 191 163    CMP Latest Ref Rng & Units 09/07/2017 08/31/2017 08/28/2017  Glucose 70 - 99 mg/dL 127(H) 108(H) 84  BUN 6 - 20 mg/dL _0 Creatinine 0.44 - 1.00 mg/dL 0.75 0.74 0.60  Sodium 135 - 145 mmol/L 140 139 140  Potassium 3.5 - 5.1 mmol/L 3.5 3.8 3.7  Chloride 98 - 111 mmol/L 107 107 112(H)  CO2 22 - 32 mmol/L _1 Calcium 8.9 - 10.3 mg/dL 9.0 9.5 7.8(L)  Total Protein 6.5 - 8.1 g/dL 7.2 7.5 -  Total Bilirubin 0.3 - 1.2 mg/dL 0.2(L) 0.3 -  Alkaline Phos 38 - 126 U/L 69 64 -  AST 15 - 41 U/L 28 16 -  ALT 0 - 44 U/L <6 7 -   ANC 1.4K today   SPEP M-protein  09/15/2014: 4.2 10/08/14: 4.6 12/08/2014: 0.2 02/15/2015: not sufficient sample for test   09/11/2015: not observed 10/09/2015: not det   11/06/2015: not det  12/25/2015: not det  02/13/2016: 0.3 03/12/2016: 0.8 04/09/2016: 0.6 05/08/16: 0.1 06/04/16: 0.1 07/02/2016: 0.2 07/09/2016: 0.2 (dara specific IFE negative) 08/05/16: Not observed  09/01/16: Not observed 09/29/16: 0.1(dara specific IFE negative) 10/27/16: Not observed 11/24/16 : Not observed 12/23/16: 0.1 (Dara specific IFE showed IgG monoclonal protein with lambda light chain) 01/21/2017: 0.1 (Dara specific IFE showed IgG monoclonal protein with lambda light chain) 03/26/17: 0.2 04/27/17: 0.1 05/25/17: Not observed 06/23/17: Not observed 08/31/17: 0.5  IgG mg/dl   11/03/2014: 3150  12/08/2014:  759 02/14/2014: 860 09/11/2015: 1347 10/09/2015: 1457 11/06/2015: 1504 12/25/2015: 1400 02/13/2016: 1543 03/12/2016: 1860 04/09/2016: 1620 05/08/16: 749 06/04/16: 751 5/30/20187: 796 07/09/2016: 701 08/05/16: 678 09/01/16: 650 09/29/16: 727 10/27/16: 634 11/24/16: 685  12/23/16: 714 01/21/2017: 709 03/26/17: 874 04/27/17: 1067 05/25/17: 1161 06/23/17: 1211 08/17/17: 1336 08/31/17: 1438  Kappa/lambda light chains levels and ration  11/03/14: 0.75, 152, 0.00 12/22/2014: 1.10, 2.60, 0.42 02/15/2015: 9.59,14.35, 0.67 09/11/2015: 2.44, 2.72, 0.90 10/09/2015: 3.09, 3.49, 0.89 11/06/2015: 2.30, 2.62, 0.88 12/25/2015: 2.51, 3.0, 0.84 02/13/2016: 2.64, 15.3, 0.17 03/12/2016: 2.27, 45.5, 0.05 04/09/2016: 3.33, 45.5, 0.07 05/08/2016: 0.49, 1.21, 0.40 06/04/2016: 0.83, 1.11, 0.75 07/02/16: 0.51, 1.43, 0.36 07/23/16: 0.68, 1.02, 0.67 08/05/16: 0.62, 1.09, 0.57 09/01/16: 0.80, 1.21, 0.66 09/29/16: 0.44, 1.07, 0.41 10/27/16: 0.63, 1.44, 0.44 11/24/16: 0.83, 2.13, 0.39 12/23/16: 0.69, 2.31, 0.30 01/21/2017: 0.77, 3.08, 0.25   24 h urine UPEP/IFE and light chain: 11/03/2014: IFE showed a monoclonal IgG heavy chain with associated lambda light chain. M protein was Undetectable    PATHOLOGY   PATHOLOGY REPORT  Bone Marrow (BM) and Peripheral Blood (PB) FINAL PATHOLOGIC DIAGNOSIS  BONE MARROW: 07/11/2015 Hypocellular marrow (20%) with no increase in plasma cells (2%). See comment. PERIPHERAL BLOOD: Mild anemia. No circulating plasma cells identified. See comment and CBC data.  BONE MARROW: 02/22/2016 Diagnosis Bone Marrow, Aspirate,Biopsy, and Clot, right iliac - NORMOCELLULAR BONE MARROW FOR AGE WITH TRILINEAGE HEMATOPOIESIS. - PLASMACYTOSIS (PLASMA CELLS 12%). - SEE COMMENT. PERIPHERAL BLOOD: - OCCASIONAL CIRCULATING PLASMA CELLS.   PROCEDURES  Bone Marrow Biopsy, 01/21/17 Bone Marrow, Aspirate, Biopsy, and Clot - NORMOCELLULAR BONE MARROW FOR AGE WITH TRILINEAGE  HEMATOPOIESIS AND 2% PLASMA CELLS. PERIPHERAL BLOOD: - NORMOCYTIC-HYPOCHROMIC ANEMIA.   Colonoscopy by Dr. Ardis Hughs on 11/26/16  IMPRESSION - One 2 mm polyp in the transverse colon, removed with a cold biopsy forceps. Resected and retrieved. - Small internal and external hemorrhoids. - The examination was otherwise normal on direct and retroflexion views.   RADIOGRAPHIC STUDIES: I have personally reviewed the radiological images as listed and agreed with the findings in the report. Dg Chest 2 View  Result Date: 08/26/2017 CLINICAL DATA:  Fever EXAM: CHEST - 2 VIEW COMPARISON:  06/08/2017, 04/06/2017 FINDINGS: Right-sided central venous port tip over the cavoatrial region. Patchy right middle lobe opacity. Stable borderline cardiomegaly. No pneumothorax. IMPRESSION: Patchy right middle lobe opacity may reflect atelectasis or a mild infiltrate. Electronically Signed   By: Donavan Foil M.D.   On: 08/26/2017 21:17   Ct Abdomen Pelvis W Contrast  Result Date: 08/26/2017 CLINICAL DATA:  Abdominal pain and fever starting yesterday. Vomiting. Currently undergoing chemotherapy for leukemia. EXAM: CT ABDOMEN AND PELVIS WITH CONTRAST TECHNIQUE: Multidetector CT imaging of the abdomen and pelvis was performed using the standard protocol following bolus administration of intravenous contrast. CONTRAST:  180m ISOVUE-300 IOPAMIDOL (ISOVUE-300) INJECTION 61% COMPARISON:  05/20/2015 FINDINGS: Lower chest: Motion artifact limits examination. There is hazy parenchymal opacity diffusely throughout the lung bases which may indicate edema or pneumonia. There is focal consolidation in the inferior right middle lung likely due to pneumonia. Mild cardiac enlargement. Hepatobiliary: Cyst in the right lobe of the liver measuring 12 mm diameter. No change since prior study. Gallbladder and bile ducts are unremarkable. Pancreas: Unremarkable. No pancreatic ductal dilatation or surrounding inflammatory changes. Spleen: There  is a multiloculated cystic mass lateral and posterior to the spleen, possibly involving the splenic capsule. The lesion measures up to about 2 x 7.4 cm in diameter. This is similar in appearance to previous study without significant progression. Adrenals/Urinary Tract: No adrenal gland nodules. Subcentimeter renal cysts bilaterally. 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 No hydronephrosis or hydroureter. No ureteral stones. Bladder wall is thickened possibly indicating cystitis. No bladder filling defects. Stomach/Bowel: Stomach, small bowel, and colon are not abnormally distended. Stool diffusely fills the colon. No wall thickening or inflammatory changes. The appendix is not identified. Vascular/Lymphatic: No significant vascular findings are present. No enlarged abdominal or pelvic lymph nodes. Reproductive: Nodular enlargement of the uterus likely representing fibroids. No abnormal adnexal masses. Other: No free air or free fluid in the abdomen. Abdominal wall musculature appears intact. Musculoskeletal: No acute or significant osseous findings. Calcifications in the subcutaneous fat over the gluteal regions likely representing injection granulomas. IMPRESSION: 1. Patchy parenchymal opacity in the lung bases may indicate edema or pneumonia. Focal consolidation in the inferior right middle lung likely represents pneumonia. 2. No evidence of bowel obstruction or inflammation. Bladder wall thickening may indicate cystitis. 3. Complex  cystic mass adjacent to the spleen. This is similar to previous study without progression. 4. 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 No change. 5. Uterine fibroids. Electronically Signed   By: Lucienne Capers M.D.   On: 08/26/2017 23:52   Dg Chest Port 1 View  Result Date: 08/28/2017 CLINICAL DATA:  Hx of Hep B and plasma cell leukemia. Pt has been SOB  with right lower anterior chest pains recently. Possibly pneumonia. EXAM: PORTABLE CHEST 1 VIEW COMPARISON:  08/26/2017 FINDINGS: Hazy opacities noted both lung bases, right greater than left, consistent with atelectasis or pneumonia, or a combination. Left base opacity is new since the previous day's study supporting atelectasis. Remainder of the lungs is clear. Heart, mediastinum and hila are unremarkable. Right anterior chest wall Port-A-Cath is stable. IMPRESSION: 1. Persistent right lung base opacity that may reflect pneumonia or atelectasis. 2. New mild left lung base opacity most likely atelectasis. Electronically Signed   By: Lajean Manes M.D.   On: 08/28/2017 10:09     CXR 05/08/2016 IMPRESSION:  No acute disease.  CT Biopsy 02/22/16 IMPRESSION: 1. Technically successful CT guided right iliac bone core and aspiration biopsy.  ASSESSMENT & PLAN:  47 y.o. [place] woman, presented with anemia and leokocytosis    1.  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 Unfortunately this showed increased plasma cells 12% with additional lambda light chain staining pending. This is relapse of disease. Cytogenetics was normal  -Her M protein and lambda free light chain level has significantly increased in 02/2016, consistent with disease relapse. Her LP showed negative CSF, no evidence of CNS involvement. -I have previously spoken with Dr. Norma Fredrickson, we agreed to change her CyBorD to weekly, and add Daratumumab weekly, starting 04/18/16.   -Continue Zometa every 4 weeks for a total of 2 years (until 11/2017) -Due to her hospitalization for severe cytopenia and infection, Cytoxan has been held. -She restarted  weekly Velcade and dexa, and on Dara every 4 weeks, tolerating well, will continue. Her dara-specific IFE was negative for M-protein on 07/07/16 at [facility], she has achieved second CR in 07/2016.  -Her SPEP and IFE detected a small amount M-protein again, the Dara specific immunofixation was positive, this is concerning for recurrence.  We will continue monitor her SPEP and immunofixation every months -Bone Marrow Biopsy on 01/21/2017 showed 2% of plasma cells, monoclonal, no definitive evidence of plasma neoplasm.  It confirmed complete response. --Due to her severe hep B flare, her treatment has been held since 02/02/2017. Dr. Norma Fredrickson was informed. Her hepatitis B flare is likely related to daratumumab. -She was seen by Dr. Norma Fredrickson, she does not have a matched bone marrow, her transplant has been held temporarily.  Dr. Norma Fredrickson recommends switching her treatment to carfilzomib 56 mg/m2 on days 1 and 15 with dexamethasone 20 mg on same days and pomalidomide 2 mg on days 1-21 of 28 day cycle. She has started on 04/13/2017  -Due to complete remission, I recommended to stop dexamethasone, and continue Pomalyst and carfilzomib as maintenance therapy indefinitely. -She was hospitalized recently for pneumonia and UTI, last treatment was postponed for a week to today. -Her M protein from 08/31/17 was 0.5.  This is concerning  for disease recurrence.  I have repeated her SPEP with immunofixation today.  If it is positive, I recommend repeating a bone marrow biopsy to see if she has relapse. - Labs reviewed and dicussed with patient. I will proceed with carflizomib and restart dexa (due to possible disease recurrence) and pomalyst today. -She has been tolerating treatment well, no significant worsening anemia or other cytopenias.  We will continue treatment. She has mild neutropenia. -Follow-up in 2 weeks for next teratment.  2. Transaminitis and hyperbilirubinemia, secondary to hepatitis B flare -She has  developed significant transaminitis and hyperbilirubinemia since January 2019 -She was previously on hepatitis B treatment entecavir by her GI at [facility] however she was off treatment for 2-3 months in late 2018, due to her issues with refill.  She did refill and restarted at the end of Sheryl 2018, her repeated hepatitis B titer was very high in Jan 2019. -it's resolved now, her total bilirubin, liver enzymes are back to normal now. She will continue to be closely followed by ID Dr. Baxter Flattery .  3. Type 2 diabetes, steroids induced  -She was noticed to have increased blood glucose level lately, with random blood glucose above 200. She also developed diabetic symptoms. No history of diabetes in the past. -This is likely steroids induced hyperglycemia -I strongly recommended her to avoid any soft drink and sweats, and watch her carbohydrate intake, and exercise more  -The addition of Metformin has been discussed with her PCP.  -I previously instructed her to take metformin every day since she is now taking dexamethasone more frequently.  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 -Continue close monitoring. -We previously discussed restarting to metformin one pill a day while on steroids to see if her levels will stabilize. I previously encouraged her to take it in the morning with breakfast.  -Due to increase in steroids, Pt has been on metformin to twice daily.   -Her BG is better controlled with metformin.  Her last hemoglobin A1c was 5% on May 25, 2017 -I will restart Dexa, will watch her BG closely   4. GERD, history of GI bleeding and nausea  -Continue omeprazole daily. We previously discussed steroids can worsen her acid reflux. -She previously had an EGD  at [facility] in 2017. This was benign.  -she did not do her colonoscopy because she did not have anyone to accompany her during visit. She has recently seen Dr. Ardis Hughs again, and is scheduled for colonoscopy. -Colonoscopy on 11/26/16 per Dr. Ardis Hughs notable for a polyp in the transverse colon, revealed to be tubular adenoma and negative for high grade dysplasia or malignancy.   5. Constipation - Her last BM was this morning - She uses Miralax twice daily. She also uses Colace - I recommend her to try Senna - I will monitor  Plan  -Lab reviewed, adequate for treatment, will proceed carfilzomib today, restart Dexa and Pomlayst today as she is recovering well from her recent hospitalization  - I will call if her repeated M protein is still positive, I will recommend a bone marrow biopsy next week.  -Follow-up in 2 weeks.   All questions were answered. The patient knows to call the clinic with any problems, questions or concerns.  I spent 25 minutes counseling the patient face to face.  The total time spent in the appointment was 30 minutes and more than 50% was on counseling.   Dierdre Searles Dweik am acting as scribe for Dr. Truitt Merle.  I have reviewed the above documentation for accuracy and completeness, and I agree with the above.     Truitt Merle, MD  09/07/2017

## 2017-09-02 LAB — PROTEIN ELECTROPHORESIS, SERUM
A/G RATIO SPE: 0.8 (ref 0.7–1.7)
ALBUMIN ELP: 3.2 g/dL (ref 2.9–4.4)
Alpha-1-Globulin: 0.3 g/dL (ref 0.0–0.4)
Alpha-2-Globulin: 1 g/dL (ref 0.4–1.0)
BETA GLOBULIN: 0.9 g/dL (ref 0.7–1.3)
Gamma Globulin: 1.7 g/dL (ref 0.4–1.8)
Globulin, Total: 3.9 g/dL (ref 2.2–3.9)
M-Spike, %: 0.5 g/dL — ABNORMAL HIGH
Total Protein ELP: 7.1 g/dL (ref 6.0–8.5)

## 2017-09-03 LAB — IFE, DARA-SPECIFIC, SERUM
IGM (IMMUNOGLOBULIN M), SRM: 54 mg/dL (ref 26–217)
IgA: 251 mg/dL (ref 87–352)
IgG (Immunoglobin G), Serum: 1438 mg/dL (ref 700–1600)

## 2017-09-06 ENCOUNTER — Other Ambulatory Visit: Payer: Self-pay | Admitting: Hematology

## 2017-09-07 ENCOUNTER — Encounter: Payer: Self-pay | Admitting: Hematology

## 2017-09-07 ENCOUNTER — Inpatient Hospital Stay: Payer: Medicaid Other

## 2017-09-07 ENCOUNTER — Inpatient Hospital Stay (HOSPITAL_BASED_OUTPATIENT_CLINIC_OR_DEPARTMENT_OTHER): Payer: Medicaid Other | Admitting: Hematology

## 2017-09-07 ENCOUNTER — Telehealth: Payer: Self-pay

## 2017-09-07 ENCOUNTER — Inpatient Hospital Stay: Payer: Medicaid Other | Attending: Hematology

## 2017-09-07 VITALS — BP 106/73 | HR 75 | Temp 98.2°F | Resp 17 | Ht 62.0 in | Wt 130.0 lb

## 2017-09-07 DIAGNOSIS — E099 Drug or chemical induced diabetes mellitus without complications: Secondary | ICD-10-CM

## 2017-09-07 DIAGNOSIS — C9012 Plasma cell leukemia in relapse: Secondary | ICD-10-CM

## 2017-09-07 DIAGNOSIS — Z5112 Encounter for antineoplastic immunotherapy: Secondary | ICD-10-CM | POA: Insufficient documentation

## 2017-09-07 DIAGNOSIS — C9010 Plasma cell leukemia not having achieved remission: Secondary | ICD-10-CM

## 2017-09-07 DIAGNOSIS — C9011 Plasma cell leukemia in remission: Secondary | ICD-10-CM | POA: Diagnosis not present

## 2017-09-07 DIAGNOSIS — D649 Anemia, unspecified: Secondary | ICD-10-CM

## 2017-09-07 DIAGNOSIS — B181 Chronic viral hepatitis B without delta-agent: Secondary | ICD-10-CM | POA: Diagnosis not present

## 2017-09-07 DIAGNOSIS — K219 Gastro-esophageal reflux disease without esophagitis: Secondary | ICD-10-CM

## 2017-09-07 DIAGNOSIS — R630 Anorexia: Secondary | ICD-10-CM

## 2017-09-07 DIAGNOSIS — R112 Nausea with vomiting, unspecified: Secondary | ICD-10-CM | POA: Insufficient documentation

## 2017-09-07 DIAGNOSIS — Z95828 Presence of other vascular implants and grafts: Secondary | ICD-10-CM

## 2017-09-07 DIAGNOSIS — J029 Acute pharyngitis, unspecified: Secondary | ICD-10-CM | POA: Insufficient documentation

## 2017-09-07 DIAGNOSIS — T380X5A Adverse effect of glucocorticoids and synthetic analogues, initial encounter: Secondary | ICD-10-CM

## 2017-09-07 DIAGNOSIS — K5900 Constipation, unspecified: Secondary | ICD-10-CM

## 2017-09-07 DIAGNOSIS — R740 Nonspecific elevation of levels of transaminase and lactic acid dehydrogenase [LDH]: Secondary | ICD-10-CM

## 2017-09-07 DIAGNOSIS — Z800 Family history of malignant neoplasm of digestive organs: Secondary | ICD-10-CM

## 2017-09-07 LAB — CBC WITH DIFFERENTIAL (CANCER CENTER ONLY)
Basophils Absolute: 0 10*3/uL (ref 0.0–0.1)
Basophils Relative: 1 %
Eosinophils Absolute: 0.1 10*3/uL (ref 0.0–0.5)
Eosinophils Relative: 2 %
HEMATOCRIT: 29.9 % — AB (ref 34.8–46.6)
HEMOGLOBIN: 9.7 g/dL — AB (ref 11.6–15.9)
LYMPHS ABS: 0.8 10*3/uL — AB (ref 0.9–3.3)
LYMPHS PCT: 28 %
MCH: 28.3 pg (ref 25.1–34.0)
MCHC: 32.2 g/dL (ref 31.5–36.0)
MCV: 87.8 fL (ref 79.5–101.0)
MONOS PCT: 15 %
Monocytes Absolute: 0.4 10*3/uL (ref 0.1–0.9)
NEUTROS ABS: 1.4 10*3/uL — AB (ref 1.5–6.5)
NEUTROS PCT: 54 %
PLATELETS: 316 10*3/uL (ref 145–400)
RBC: 3.41 MIL/uL — AB (ref 3.70–5.45)
RDW: 19 % — ABNORMAL HIGH (ref 11.2–14.5)
WBC: 2.7 10*3/uL — AB (ref 3.9–10.3)

## 2017-09-07 LAB — CMP (CANCER CENTER ONLY)
ANION GAP: 10 (ref 5–15)
AST: 28 U/L (ref 15–41)
Albumin: 3.1 g/dL — ABNORMAL LOW (ref 3.5–5.0)
Alkaline Phosphatase: 69 U/L (ref 38–126)
BUN: 15 mg/dL (ref 6–20)
CHLORIDE: 107 mmol/L (ref 98–111)
CO2: 23 mmol/L (ref 22–32)
Calcium: 9 mg/dL (ref 8.9–10.3)
Creatinine: 0.75 mg/dL (ref 0.44–1.00)
Glucose, Bld: 127 mg/dL — ABNORMAL HIGH (ref 70–99)
POTASSIUM: 3.5 mmol/L (ref 3.5–5.1)
SODIUM: 140 mmol/L (ref 135–145)
Total Bilirubin: 0.2 mg/dL — ABNORMAL LOW (ref 0.3–1.2)
Total Protein: 7.2 g/dL (ref 6.5–8.1)

## 2017-09-07 MED ORDER — SODIUM CHLORIDE 0.9 % IV SOLN
Freq: Once | INTRAVENOUS | Status: AC
Start: 2017-09-07 — End: 2017-09-07
  Administered 2017-09-07: 10:00:00 via INTRAVENOUS
  Filled 2017-09-07: qty 250

## 2017-09-07 MED ORDER — DEXAMETHASONE 4 MG PO TABS
ORAL_TABLET | ORAL | Status: AC
  Filled 2017-09-07: qty 5

## 2017-09-07 MED ORDER — PROCHLORPERAZINE MALEATE 10 MG PO TABS
ORAL_TABLET | ORAL | Status: AC
  Filled 2017-09-07: qty 1

## 2017-09-07 MED ORDER — ZOLEDRONIC ACID 4 MG/100ML IV SOLN
4.0000 mg | Freq: Once | INTRAVENOUS | Status: AC
  Administered 2017-09-07: 4 mg via INTRAVENOUS
  Filled 2017-09-07: qty 100

## 2017-09-07 MED ORDER — DEXTROSE 5 % IV SOLN
56.0000 mg/m2 | Freq: Once | INTRAVENOUS | Status: AC
  Administered 2017-09-07: 90 mg via INTRAVENOUS
  Filled 2017-09-07: qty 15

## 2017-09-07 MED ORDER — PROCHLORPERAZINE MALEATE 10 MG PO TABS
10.0000 mg | ORAL_TABLET | Freq: Once | ORAL | Status: AC
  Administered 2017-09-07: 10 mg via ORAL

## 2017-09-07 MED ORDER — SODIUM CHLORIDE 0.9% FLUSH
10.0000 mL | INTRAVENOUS | Status: DC | PRN
  Administered 2017-09-07: 10 mL
  Filled 2017-09-07: qty 10

## 2017-09-07 MED ORDER — DEXAMETHASONE 4 MG PO TABS
20.0000 mg | ORAL_TABLET | Freq: Once | ORAL | Status: AC
  Administered 2017-09-07: 20 mg via ORAL

## 2017-09-07 MED ORDER — SODIUM CHLORIDE 0.9 % IV SOLN
Freq: Once | INTRAVENOUS | Status: AC
  Administered 2017-09-07: 10:00:00 via INTRAVENOUS
  Filled 2017-09-07: qty 250

## 2017-09-07 MED ORDER — HEPARIN SOD (PORK) LOCK FLUSH 100 UNIT/ML IV SOLN
500.0000 [IU] | Freq: Once | INTRAVENOUS | Status: AC | PRN
  Administered 2017-09-07: 500 [IU]
  Filled 2017-09-07: qty 5

## 2017-09-07 NOTE — Telephone Encounter (Signed)
Printed avs and calender of upcoming appointment. Per 8/5 los 

## 2017-09-07 NOTE — Telephone Encounter (Signed)
Okay to give Zometa today with calcium 9.0. Spoke with Katelin in Infusion

## 2017-09-07 NOTE — Progress Notes (Signed)
Per patient's physician Dr. Burr Medico, okay to treat with Neutro Abs of 1.4

## 2017-09-07 NOTE — Progress Notes (Signed)
Confirmed with Dr. Burr Medico that pt is to received Zometa today with Calcium of 9.0 (also confirmed with pt that she takes Calcium supplement at home). Orders released. Will continue to monitor

## 2017-09-07 NOTE — Patient Instructions (Signed)
[facility] Discharge Instructions for Patients Receiving Chemotherapy  Today you received the following chemotherapy agents: Carfilzamib (Kyprolis)  To help prevent nausea and vomiting after your treatment, we encourage you to take your nausea medication as prescribed.    If you develop nausea and vomiting that is not controlled by your nausea medication, call the clinic.   BELOW ARE SYMPTOMS THAT SHOULD BE REPORTED IMMEDIATELY:  *FEVER GREATER THAN 100.5 F  *CHILLS WITH OR WITHOUT FEVER  NAUSEA AND VOMITING THAT IS NOT CONTROLLED WITH YOUR NAUSEA MEDICATION  *UNUSUAL SHORTNESS OF BREATH  *UNUSUAL BRUISING OR BLEEDING  TENDERNESS IN MOUTH AND THROAT WITH OR WITHOUT PRESENCE OF ULCERS  *URINARY PROBLEMS  *BOWEL PROBLEMS  UNUSUAL RASH Items with * indicate a potential emergency and should be followed up as soon as possible.  Feel free to call the clinic should you have any questions or concerns. The clinic phone number is [phone].  Please show the [facility] at check-in to the Emergency Department and triage nurse.

## 2017-09-07 NOTE — Telephone Encounter (Signed)
Per Dr. Burr Medico okay to give

## 2017-09-08 LAB — PROTEIN ELECTROPHORESIS, SERUM
A/G RATIO SPE: 0.9 (ref 0.7–1.7)
ALBUMIN ELP: 3.1 g/dL (ref 2.9–4.4)
Alpha-1-Globulin: 0.2 g/dL (ref 0.0–0.4)
Alpha-2-Globulin: 0.9 g/dL (ref 0.4–1.0)
Beta Globulin: 0.9 g/dL (ref 0.7–1.3)
GLOBULIN, TOTAL: 3.4 g/dL (ref 2.2–3.9)
Gamma Globulin: 1.4 g/dL (ref 0.4–1.8)
Total Protein ELP: 6.5 g/dL (ref 6.0–8.5)

## 2017-09-10 ENCOUNTER — Telehealth: Payer: Self-pay

## 2017-09-10 LAB — IFE, DARA-SPECIFIC, SERUM
IGA: 249 mg/dL (ref 87–352)
IGG (IMMUNOGLOBIN G), SERUM: 1474 mg/dL (ref 700–1600)
IgM (Immunoglobulin M), Srm: 45 mg/dL (ref 26–217)

## 2017-09-10 NOTE — Telephone Encounter (Signed)
-----  Message from Truitt Merle, MD sent at 09/09/2017  7:11 AM EDT ----- Please let pt know the SPEP results, M-protein was negative this time, so no need to do bone marrow biopsy for now. Thanks  Truitt Merle  09/09/2017

## 2017-09-10 NOTE — Telephone Encounter (Signed)
Left voice message per Dr. Burr Medico based on recent labs, we do not need to do a bone marrow biopsy for now.

## 2017-09-14 ENCOUNTER — Other Ambulatory Visit: Payer: Medicaid Other

## 2017-09-14 ENCOUNTER — Ambulatory Visit: Payer: Medicaid Other | Admitting: Hematology

## 2017-09-14 ENCOUNTER — Ambulatory Visit: Payer: Medicaid Other

## 2017-09-22 ENCOUNTER — Inpatient Hospital Stay (HOSPITAL_BASED_OUTPATIENT_CLINIC_OR_DEPARTMENT_OTHER): Payer: Medicaid Other | Admitting: Nurse Practitioner

## 2017-09-22 ENCOUNTER — Inpatient Hospital Stay: Payer: Medicaid Other

## 2017-09-22 ENCOUNTER — Encounter: Payer: Self-pay | Admitting: Nurse Practitioner

## 2017-09-22 ENCOUNTER — Ambulatory Visit (HOSPITAL_COMMUNITY)
Admission: RE | Admit: 2017-09-22 | Discharge: 2017-09-22 | Disposition: A | Discharge status: Home / Self Care | Payer: Medicaid Other | Source: Ambulatory Visit | Attending: Nurse Practitioner | Admitting: Nurse Practitioner

## 2017-09-22 VITALS — BP 113/75 | HR 92 | Temp 98.6°F | Resp 16 | Ht 62.0 in | Wt 129.4 lb

## 2017-09-22 DIAGNOSIS — E099 Drug or chemical induced diabetes mellitus without complications: Secondary | ICD-10-CM

## 2017-09-22 DIAGNOSIS — C9010 Plasma cell leukemia not having achieved remission: Secondary | ICD-10-CM

## 2017-09-22 DIAGNOSIS — Z95828 Presence of other vascular implants and grafts: Secondary | ICD-10-CM

## 2017-09-22 DIAGNOSIS — R05 Cough: Secondary | ICD-10-CM

## 2017-09-22 DIAGNOSIS — C9012 Plasma cell leukemia in relapse: Secondary | ICD-10-CM

## 2017-09-22 DIAGNOSIS — R918 Other nonspecific abnormal finding of lung field: Secondary | ICD-10-CM | POA: Insufficient documentation

## 2017-09-22 DIAGNOSIS — K219 Gastro-esophageal reflux disease without esophagitis: Secondary | ICD-10-CM

## 2017-09-22 DIAGNOSIS — R059 Cough, unspecified: Secondary | ICD-10-CM

## 2017-09-22 DIAGNOSIS — R112 Nausea with vomiting, unspecified: Secondary | ICD-10-CM | POA: Diagnosis not present

## 2017-09-22 DIAGNOSIS — B181 Chronic viral hepatitis B without delta-agent: Secondary | ICD-10-CM

## 2017-09-22 DIAGNOSIS — Z5112 Encounter for antineoplastic immunotherapy: Secondary | ICD-10-CM | POA: Diagnosis not present

## 2017-09-22 DIAGNOSIS — J90 Pleural effusion, not elsewhere classified: Secondary | ICD-10-CM | POA: Diagnosis not present

## 2017-09-22 DIAGNOSIS — J029 Acute pharyngitis, unspecified: Secondary | ICD-10-CM | POA: Diagnosis not present

## 2017-09-22 DIAGNOSIS — J189 Pneumonia, unspecified organism: Secondary | ICD-10-CM | POA: Diagnosis present

## 2017-09-22 DIAGNOSIS — R5383 Other fatigue: Secondary | ICD-10-CM | POA: Diagnosis not present

## 2017-09-22 DIAGNOSIS — R740 Nonspecific elevation of levels of transaminase and lactic acid dehydrogenase [LDH]: Secondary | ICD-10-CM

## 2017-09-22 DIAGNOSIS — K5900 Constipation, unspecified: Secondary | ICD-10-CM

## 2017-09-22 LAB — CBC WITH DIFFERENTIAL (CANCER CENTER ONLY)
BASOS ABS: 0 10*3/uL (ref 0.0–0.1)
BASOS PCT: 1 %
EOS ABS: 0.1 10*3/uL (ref 0.0–0.5)
EOS PCT: 3 %
HCT: 31.7 % — ABNORMAL LOW (ref 34.8–46.6)
Hemoglobin: 10 g/dL — ABNORMAL LOW (ref 11.6–15.9)
Lymphocytes Relative: 14 %
Lymphs Abs: 0.7 10*3/uL — ABNORMAL LOW (ref 0.9–3.3)
MCH: 28 pg (ref 25.1–34.0)
MCHC: 31.6 g/dL (ref 31.5–36.0)
MCV: 88.8 fL (ref 79.5–101.0)
Monocytes Absolute: 0.6 10*3/uL (ref 0.1–0.9)
Monocytes Relative: 14 %
Neutro Abs: 3.1 10*3/uL (ref 1.5–6.5)
Neutrophils Relative %: 68 %
PLATELETS: 180 10*3/uL (ref 145–400)
RBC: 3.57 MIL/uL — ABNORMAL LOW (ref 3.70–5.45)
RDW: 19 % — AB (ref 11.2–14.5)
WBC: 4.5 10*3/uL (ref 3.9–10.3)

## 2017-09-22 LAB — CMP (CANCER CENTER ONLY)
ALK PHOS: 82 U/L (ref 38–126)
ALT: 9 U/L (ref 0–44)
AST: 23 U/L (ref 15–41)
Albumin: 3.2 g/dL — ABNORMAL LOW (ref 3.5–5.0)
Anion gap: 7 (ref 5–15)
BILIRUBIN TOTAL: 0.5 mg/dL (ref 0.3–1.2)
BUN: 11 mg/dL (ref 6–20)
CALCIUM: 8.9 mg/dL (ref 8.9–10.3)
CO2: 25 mmol/L (ref 22–32)
CREATININE: 0.79 mg/dL (ref 0.44–1.00)
Chloride: 107 mmol/L (ref 98–111)
GFR, Est AFR Am: >60 mL/min (ref >60)
Glucose, Bld: 112 mg/dL — ABNORMAL HIGH (ref 70–99)
Potassium: 3.9 mmol/L (ref 3.5–5.1)
Sodium: 139 mmol/L (ref 135–145)
Total Protein: 7.3 g/dL (ref 6.5–8.1)

## 2017-09-22 LAB — INFLUENZA PANEL BY PCR (TYPE A & B)
INFLAPCR: NEGATIVE
Influenza B By PCR: NEGATIVE

## 2017-09-22 LAB — PREGNANCY, URINE: PREG TEST UR: NEGATIVE

## 2017-09-22 MED ORDER — SODIUM CHLORIDE 0.9 % IV SOLN
Freq: Once | INTRAVENOUS | Status: AC
  Administered 2017-09-22: 12:00:00 via INTRAVENOUS
  Filled 2017-09-22: qty 250

## 2017-09-22 MED ORDER — SODIUM CHLORIDE 0.9 % IV SOLN: Freq: Once | INTRAVENOUS | Status: DC

## 2017-09-22 MED ORDER — ONDANSETRON HCL 4 MG/2ML IJ SOLN
INTRAMUSCULAR | Status: AC
  Filled 2017-09-22: qty 4

## 2017-09-22 MED ORDER — SODIUM CHLORIDE 0.9% FLUSH
10.0000 mL | INTRAVENOUS | Status: DC | PRN
  Administered 2017-09-22: 10 mL
  Filled 2017-09-22: qty 10

## 2017-09-22 MED ORDER — ONDANSETRON HCL 4 MG/2ML IJ SOLN
8.0000 mg | Freq: Once | INTRAMUSCULAR | Status: AC
  Administered 2017-09-22: 8 mg via INTRAVENOUS

## 2017-09-22 MED ORDER — ALTEPLASE 2 MG IJ SOLR
2.0000 mg | Freq: Once | INTRAMUSCULAR | Status: DC | PRN
  Filled 2017-09-22: qty 2

## 2017-09-22 MED ORDER — SODIUM CHLORIDE 0.9% FLUSH
10.0000 mL | INTRAVENOUS | Status: DC | PRN
  Administered 2017-09-22: 10 mL via INTRAVENOUS
  Filled 2017-09-22: qty 10

## 2017-09-22 MED ORDER — HEPARIN SOD (PORK) LOCK FLUSH 100 UNIT/ML IV SOLN
500.0000 [IU] | Freq: Once | INTRAVENOUS | Status: AC | PRN
  Administered 2017-09-22: 500 [IU]
  Filled 2017-09-22: qty 5

## 2017-09-22 MED ORDER — LEVOFLOXACIN 750 MG PO TABS: 750.0000 mg | ORAL_TABLET | Freq: Every day | ORAL | 0 refills | Status: DC

## 2017-09-22 NOTE — Patient Instructions (Addendum)
Dehydration, Adult Dehydration is when there is not enough fluid or water in your body. This happens when you lose more fluids than you take in. Dehydration can range from mild to very bad. It should be treated right away to keep it from getting very bad. Symptoms of mild dehydration may include:  Thirst.  Dry lips.  Slightly dry mouth.  Dry, warm skin.  Dizziness. Symptoms of moderate dehydration may include:  Very dry mouth.  Muscle cramps.  Dark pee (urine). Pee may be the color of tea.  Your body making less pee.  Your eyes making fewer tears.  Heartbeat that is uneven or faster than normal (palpitations).  Headache.  Light-headedness, especially when you stand up from sitting.  Fainting (syncope). Symptoms of very bad dehydration may include:  Changes in skin, such as: ? Cold and clammy skin. ? Blotchy (mottled) or pale skin. ? Skin that does not quickly return to normal after being lightly pinched and let go (poor skin turgor).  Changes in body fluids, such as: ? Feeling very thirsty. ? Your eyes making fewer tears. ? Not sweating when body temperature is high, such as in hot weather. ? Your body making very little pee.  Changes in vital signs, such as: ? Weak pulse. ? Pulse that is more than 100 beats a minute when you are sitting still. ? Fast breathing. ? Low blood pressure.  Other changes, such as: ? Sunken eyes. ? Cold hands and feet. ? Confusion. ? Lack of energy (lethargy). ? Trouble waking up from sleep. ? Short-term weight loss. ? Unconsciousness. Follow these instructions at home:  If told by your doctor, drink an ORS: ? Make an ORS by using instructions on the package. ? Start by drinking small amounts, about  cup (120 mL) every 5-10 minutes. ? Slowly drink more until you have had the amount that your doctor said to have.  Drink enough clear fluid to keep your pee clear or pale yellow. If you were told to drink an ORS, finish the ORS  first, then start slowly drinking clear fluids. Drink fluids such as: ? Water. Do not drink only water by itself. Doing that can make the salt (sodium) level in your body get too low (hyponatremia). ? Ice chips. ? Fruit juice that you have added water to (diluted). ? Low-calorie sports drinks.  Avoid: ? Alcohol. ? Drinks that have a lot of sugar. These include high-calorie sports drinks, fruit juice that does not have water added, and soda. ? Caffeine. ? Foods that are greasy or have a lot of fat or sugar.  Take over-the-counter and prescription medicines only as told by your doctor.  Do not take salt tablets. Doing that can make the salt level in your body get too high (hypernatremia).  Eat foods that have minerals (electrolytes). Examples include bananas, oranges, potatoes, tomatoes, and spinach.  Keep all follow-up visits as told by your doctor. This is important. Contact a doctor if:  You have belly (abdominal) pain that: ? Gets worse. ? Stays in one area (localizes).  You have a rash.  You have a stiff neck.  You get angry or annoyed more easily than normal (irritability).  You are more sleepy than normal.  You have a harder time waking up than normal.  You feel: ? Weak. ? Dizzy. ? Very thirsty.  You have peed (urinated) only a small amount of very dark pee during 6-8 hours. Get help right away if:  You have symptoms of   very bad dehydration.  You cannot drink fluids without throwing up (vomiting).  Your symptoms get worse with treatment.  You have a fever.  You have a very bad headache.  You are throwing up or having watery poop (diarrhea) and it: ? Gets worse. ? Does not go away.  You have blood or something green (bile) in your throw-up.  You have blood in your poop (stool). This may cause poop to look black and tarry.  You have not peed in 6-8 hours.  You pass out (faint).  Your heart rate when you are sitting still is more than 100 beats a  minute.  You have trouble breathing. This information is not intended to replace advice given to you by your health care provider. Make sure you discuss any questions you have with your health care provider. Document Released: 11/16/2008 Document Revised: 08/10/2015 Document Reviewed: 03/16/2015 Elsevier Interactive Patient Education  2018 [employer]  Dehydration, Adult Dehydration is a condition in which there is not enough fluid or water in the body. This happens when you lose more fluids than you take in. Important organs, such as the kidneys, brain, and heart, cannot function without a proper amount of fluids. Any loss of fluids from the body can lead to dehydration. Dehydration can range from mild to severe. This condition should be treated right away to prevent it from becoming severe. What are the causes? This condition may be caused by:  Vomiting.  Diarrhea.  Excessive sweating, such as from heat exposure or exercise.  Not drinking enough fluid, especially: ? When ill. ? While doing activity that requires a lot of energy.  Excessive urination.  Fever.  Infection.  Certain medicines, such as medicines that cause the body to lose excess fluid (diuretics).  Inability to access safe drinking water.  Reduced physical ability to get adequate water and food.  What increases the risk? This condition is more likely to develop in people:  Who have a poorly controlled long-term (chronic) illness, such as diabetes, heart disease, or kidney disease.  Who are age 65 or older.  Who are disabled.  Who live in a place with high altitude.  Who play endurance sports.  What are the signs or symptoms? Symptoms of mild dehydration may include:  Thirst.  Dry lips.  Slightly dry mouth.  Dry, warm skin.  Dizziness. Symptoms of moderate dehydration may include:  Very dry mouth.  Muscle cramps.  Dark urine. Urine may be the color of tea.  Decreased urine  production.  Decreased tear production.  Heartbeat that is irregular or faster than normal (palpitations).  Headache.  Light-headedness, especially when you stand up from a sitting position.  Fainting (syncope). Symptoms of severe dehydration may include:  Changes in skin, such as: ? Cold and clammy skin. ? Blotchy (mottled) or pale skin. ? Skin that does not quickly return to normal after being lightly pinched and released (poor skin turgor).  Changes in body fluids, such as: ? Extreme thirst. ? No tear production. ? Inability to sweat when body temperature is high, such as in hot weather. ? Very little urine production.  Changes in vital signs, such as: ? Weak pulse. ? Pulse that is more than 100 beats a minute when sitting still. ? Rapid breathing. ? Low blood pressure.  Other changes, such as: ? Sunken eyes. ? Cold hands and feet. ? Confusion. ? Lack of energy (lethargy). ? Difficulty waking up from sleep. ? Short-term weight loss. ? Unconsciousness. How is   this diagnosed? This condition is diagnosed based on your symptoms and a physical exam. Blood and urine tests may be done to help confirm the diagnosis. How is this treated? Treatment for this condition depends on the severity. Mild or moderate dehydration can often be treated at home. Treatment should be started right away. Do not wait until dehydration becomes severe. Severe dehydration is an emergency and it needs to be treated in a hospital. Treatment for mild dehydration may include:  Drinking more fluids.  Replacing salts and minerals in your blood (electrolytes) that you may have lost. Treatment for moderate dehydration may include:  Drinking an oral rehydration solution (ORS). This is a drink that helps you replace fluids and electrolytes (rehydrate). It can be found at pharmacies and retail stores. Treatment for severe dehydration may include:  Receiving fluids through an IV tube.  Receiving an  electrolyte solution through a feeding tube that is passed through your nose and into your stomach (nasogastric tube, or NG tube).  Correcting any abnormalities in electrolytes.  Treating the underlying cause of dehydration. Follow these instructions at home:  If directed by your health care provider, drink an ORS: ? Make an ORS by following instructions on the package. ? Start by drinking small amounts, about  cup (120 mL) every 5-10 minutes. ? Slowly increase how much you drink until you have taken the amount recommended by your health care provider.  Drink enough clear fluid to keep your urine clear or pale yellow. If you were told to drink an ORS, finish the ORS first, then start slowly drinking other clear fluids. Drink fluids such as: ? Water. Do not drink only water. Doing that can lead to having too little salt (sodium) in the body (hyponatremia). ? Ice chips. ? Fruit juice that you have added water to (diluted fruit juice). ? Low-calorie sports drinks.  Avoid: ? Alcohol. ? Drinks that contain a lot of sugar. These include high-calorie sports drinks, fruit juice that is not diluted, and soda. ? Caffeine. ? Foods that are greasy or contain a lot of fat or sugar.  Take over-the-counter and prescription medicines only as told by your health care provider.  Do not take sodium tablets. This can lead to having too much sodium in the body (hypernatremia).  Eat foods that contain a healthy balance of electrolytes, such as bananas, oranges, potatoes, tomatoes, and spinach.  Keep all follow-up visits as told by your health care provider. This is important. Contact a health care provider if:  You have abdominal pain that: ? Gets worse. ? Stays in one area (localizes).  You have a rash.  You have a stiff neck.  You are more irritable than usual.  You are sleepier or more difficult to wake up than usual.  You feel weak or dizzy.  You feel very thirsty.  You have urinated  only a small amount of very dark urine over 6-8 hours. Get help right away if:  You have symptoms of severe dehydration.  You cannot drink fluids without vomiting.  Your symptoms get worse with treatment.  You have a fever.  You have a severe headache.  You have vomiting or diarrhea that: ? Gets worse. ? Does not go away.  You have blood or green matter (bile) in your vomit.  You have blood in your stool. This may cause stool to look black and tarry.  You have not urinated in 6-8 hours.  You faint.  Your heart rate while sitting still is   over 100 beats a minute.  You have trouble breathing. This information is not intended to replace advice given to you by your health care provider. Make sure you discuss any questions you have with your health care provider. Document Released: 01/20/2005 Document Revised: 08/17/2015 Document Reviewed: 03/16/2015 Elsevier Interactive Patient Education  2018 [employer]  

## 2017-09-22 NOTE — Progress Notes (Signed)
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    10/10/2014 Miscellaneous    Peripheral blood chemistry and leukocytosis with total white count 78K, comprised of large plasma cells and his normocytic anemia. There is a myeloid left shift with previous surgical radium blasts. Flow cytometry showed 64% plasma cells    10/10/2014 Bone Marrow Biopsy    Markedly hypercellular marrow (95%), Atypical plasma cells comprise 57% of the cellularity. There was diminished multilineage in hematopoiesis with adequate maturation. Breasts less than 1%), no overt dysplasia of the myeloid or erythroid lineages.     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    05/07/2015 - 05/12/2015 Hospital Admission    [facility], [facility]. ID workup was negative, EGD showed evidence of gastritis and duodenitis, no H. pylori or CMV.    05/20/2015 - 05/24/2015 Hospital Admission   [facility]    07/11/2015 Bone Marrow Biopsy    Post transplant 100 a bone marrow biopsy showed hypocellular marrow, 20%, no increase in plasma cells (2%) or other abnormalities.    08/22/2015 - 01/02/2016 Chemotherapy    MaintenanceCyborD (cytoxan 3085mm2 iv, bortezomib 1.5 mg/m, dexamethasone 40 mg, every 2 weeks, changed to Velcade maintenance after 4 months treatment    01/16/2016 - 04/19/2016 Chemotherapy    Maintenance Velcade 1.3 mg/m every 2 weeks    02/22/2016 Pathology Results    BONE MARROW: Diagnosis Bone Marrow, Aspirate,Biopsy, and Clot, right iliac - NORMOCELLULAR BONE MARROW FOR AGE WITH TRILINEAGE HEMATOPOIESIS. - PLASMACYTOSIS (PLASMA CELLS 12%). - SEE COMMENT. PERIPHERAL BLOOD: - OCCASIONAL CIRCULATING PLASMA CELLS.    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     04/18/2016 - 03/26/2017 Chemotherapy    Daratumumab per protocol  CyBorD every week, cytoxan was held after 04/24/2016 dye to cytopenia and infection  -discontinued due to Hep B flare    05/11/2016 - 05/16/2016 Hospital Admission    Healthcare-associated pneumonia    02/16/2017 - 02/21/2017 Hospital Admission    Admission diagnosis: Abnormal LFT's Additional comments: Assoc diagnoses: Hep B flare, transminitis, dehydration, fever, SIRS    04/06/2017 -  Chemotherapy    Carfilzomib 56 mg/m2 on days 1 and 15 (except 2016m2 on C1D1 and C1D2) with dexamethasone 20 mg on same days and pomalidomide 2 mg on days 1-21 of 28 day cycle        08/26/2017 - 08/28/2017 Hospital Admission    Admit date: 08/26/2017 Admission diagnosis: RLL Pneumonia  CURRENT THERAPY:   carfilzomib 56 mg/m2 on days 1 and 15(except 76m/m2 on C1D1 and C1D2)with dexamethasone 20 mg on same days and pomalidomide 2 mg on days 1-21 of 28 day cycleon 04/13/17, dexa stopped on 07/06/2017 due to her CR   INTERVAL HISTORY: Ms.  HDotyreturns for follow up and kyprolis as scheduled. She completed cycle 6 day 1 infusion on 8/ 5/19. She has been taking pomalyst daily but held her dose today. 4 days ago she suddenly developed sore throat with horseness, congestion, headache, productive cough with yellow sputum, and vomiting once daily. She fluctuates between hot and cold with chills, no known fever. Her symptoms have made her very fatigued. Appetite is low with little po intake, however she is drinking. Has not tried over the counter remedies. Did not call [facility] [place] notify. Denies chest pain or dyspnea.   REVIEW OF SYSTEMS:   Constitutional: Denies fevers, chills or abnormal weight loss (+) fatigue (+) decreased appetite  Ears, nose, mouth, throat, and face: Denies mucositis (+) sore throat (+) horseness (+) congestion  Respiratory: Denies dyspnea or wheezes (+) productive cough with yellow sputum  Cardiovascular: Denies palpitation, chest discomfort or lower extremity swelling Gastrointestinal:  Denies nausea, constipation, diarrhea, heartburn or change in bowel habits (+) vomiting daily x4 days  Skin: Denies abnormal skin rashes Lymphatics: Denies new lymphadenopathy or easy bruising All other systems were reviewed with the patient and are negative.  MEDICAL HISTORY:  Past Medical History:  Diagnosis Date  . Chills with fever    intermittently since d/c from hospital  . Dysuria-frequency syndrome    w/ pink urine  . GERD (gastroesophageal reflux disease)   . Hepatitis   . History of positive PPD    DX 2011--  CXR DONE NO EVIDENCE  . History of ureter stent   . Hydronephrosis, right   . Neuromuscular disorder (H[facility])    legs numb intermittently  . 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   . Pneumonia   . Right ureteral stone   . Urosepsis 8/14   ad[facility]    SURGICAL HISTORY: Past Surgical History:  Procedure Laterality Date  . CYSTOSCOPY W/ URETERAL STENT PLACEMENT Right 09/25/2012   Procedure: CYSTOSCOPY WITH  RETROGRADE PYELOGRAM/URETERAL STENT PLACEMENT;  Surgeon: TAlexis Frock MD;  Location: [facility];  Service: Urology;  Laterality: Right;  . CYSTOSCOPY WITH RETROGRADE PYELOGRAM, URETEROSCOPY AND STENT PLACEMENT Right 10/15/2012   Procedure: CYSTOSCOPY WITH RETROGRADE PYELOGRAM, URETEROSCOPY AND REMOVAL STENT WITH  STENT PLACEMENT;  Surgeon: TAlexis Frock MD;  [facility]  Service: Urology;  Laterality: Right;  . ESOPHAGOGASTRODUODENOSCOPY (EGD) WITH PROPOFOL N/A 11/16/2014   Procedure: ESOPHAGOGASTRODUODENOSCOPY (EGD) WITH PROPOFOL;  Surgeon: DMilus Banister MD;  Location: [facility];  Service: Endoscopy;  Laterality: N/A;  . HOLMIUM LASER APPLICATION Right 95/17/6160  Procedure: HOLMIUM LASER APPLICATION;  Surgeon: TAlexis Frock MD;  [facility]  Service: Urology;  Laterality: Right;  . LIVER BIOPSY    . OTHER SURGICAL HISTORY Right    removal of ovarian cyst  . removal of uterine cyst     years ago  . RIGHT VATS W/ DRAINAGE PEURAL EFFUSION AND BX'S  10-30-2008    I have reviewed the social history and family history with the patient and they are unchanged from previous note.  ALLERGIES:  has No Known Allergies.  MEDICATIONS:  Current Outpatient Medications  Medication Sig Dispense Refill  . acyclovir (ZOVIRAX) 800 MG tablet Take 1 tablet (800  mg total) by mouth 2 (two) times daily. 60 tablet 5  . chlorpheniramine-HYDROcodone (TUSSIONEX) 10-8 MG/5ML SUER Take 5 mLs by mouth every 12 (twelve) hours as needed for cough. 140 mL 0  . feeding supplement, ENSURE ENLIVE, (ENSURE ENLIVE) LIQD Take 237 mLs by mouth 2 (two) times daily between meals. 237 mL 12  . ibuprofen (ADVIL,MOTRIN) 200 MG tablet Take 200 mg by mouth every 6 (six) hours as needed for moderate pain.    . magic mouthwash SOLN Take 5 mLs by mouth 4 (four) times daily as needed for mouth pain. 240 mL 3  . omeprazole (PRILOSEC) 20 MG capsule Take 1 capsule (20 mg total) by  mouth daily. 30 capsule 5  . ondansetron (ZOFRAN) 8 MG tablet Take 1 tablet (8 mg total) by mouth every 8 (eight) hours as needed for nausea or vomiting. 30 tablet 1  . polyethylene glycol (MIRALAX / GLYCOLAX) packet Take 17 g by mouth daily as needed (constipation). 14 each 1  . pomalidomide (POMALYST) 2 MG capsule Take 1 capsule (2 mg total) by mouth daily. Take with water on days 1-21. Repeat every 28 days. 21 capsule 0  . potassium chloride SA (K-DUR,KLOR-CON) 20 MEQ tablet Take 1 tablet (20 mEq total) by mouth daily. 30 tablet 0  . prochlorperazine (COMPAZINE) 10 MG tablet Take 1 tablet (10 mg total) by mouth every 6 (six) hours as needed for nausea or vomiting. 30 tablet 3  . promethazine (PHENERGAN) 25 MG tablet Take 1 tablet (25 mg total) by mouth every 6 (six) hours as needed for nausea or vomiting. 30 tablet 0  . ursodiol (ACTIGALL) 300 MG capsule Take 1 capsule (300 mg total) by mouth 2 (two) times daily. 60 capsule 0  . VIREAD 300 MG tablet Take 1 tablet (300 mg total) by mouth daily. 30 tablet 11  . levofloxacin (LEVAQUIN) 750 MG tablet Take 1 tablet (750 mg total) by mouth daily. 7 tablet 0   No current facility-administered medications for this visit.    Facility-Administered Medications Ordered in Other Visits  Medication Dose Route Frequency Provider Last Rate Last Dose  . sodium chloride 0.9 % injection 10 mL  10 mL Intravenous PRN Truitt Merle, MD   10 mL at 12/11/15 1532  . sodium chloride 0.9 % injection 10 mL  10 mL Intravenous PRN Truitt Merle, MD   10 mL at 06/11/16 1505  . sodium chloride 0.9 % injection 10 mL  10 mL Intravenous PRN Truitt Merle, MD   10 mL at 08/04/17 1643  . sodium chloride flush (NS) 0.9 % injection 10 mL  10 mL Intravenous PRN Truitt Merle, MD   10 mL at 10/09/15 1717    PHYSICAL EXAMINATION: ECOG PERFORMANCE STATUS: 3 - Symptomatic, >50% confined to bed  Vitals:   09/22/17 1003  BP: 113/75  Pulse: 92  Resp: 16  Temp: 98.6 F (37 C)  SpO2: 100%    Filed Weights   09/22/17 1003  Weight: 129 lb 6.4 oz (58.7 kg)    GENERAL: alert, no distress  SKIN:  no rashes or significant lesions EYES: sclera clear OROPHARYNX:no thrush or ulcers. Pharyngeal edema without significant erythema  LYMPH:  no palpable lymphadenopathy in the cervical, axillary or inguinal LUNGS: rales RLL, otherwise clear to auscultation with normal breathing effort HEART: regular rate & rhythm, no lower extremity edema ABDOMEN: abdomen soft, non-tender and normal bowel sounds Musculoskeletal:no cyanosis of digits and no clubbing  NEURO: alert & oriented x 3 with fluent  speech, no focal motor/sensory deficits  LABORATORY DATA:  I have reviewed the data as listed CBC Latest Ref Rng & Units 09/22/2017 09/07/2017 08/31/2017  WBC 3.9 - 10.3 K/uL 4.5 2.7(L) 3.0(L)  Hemoglobin 11.6 - 15.9 g/dL 10.0(L) 9.7(L) 9.4(L)  Hematocrit 34.8 - 46.6 % 31.7(L) 29.9(L) 31.0(L)  Platelets 145 - 400 K/uL 180 316 191     CMP Latest Ref Rng & Units 09/22/2017 09/07/2017 08/31/2017  Glucose 70 - 99 mg/dL 112(H) 127(H) 108(H)  BUN 6 - 20 mg/dL '11 15 10  ' Creatinine 0.44 - 1.00 mg/dL 0.79 0.75 0.74  Sodium 135 - 145 mmol/L 139 140 139  Potassium 3.5 - 5.1 mmol/L 3.9 3.5 3.8  Chloride 98 - 111 mmol/L 107 107 107  CO2 22 - 32 mmol/L '25 23 27  ' Calcium 8.9 - 10.3 mg/dL 8.9 9.0 9.5  Total Protein 6.5 - 8.1 g/dL 7.3 7.2 7.5  Total Bilirubin 0.3 - 1.2 mg/dL 0.5 0.2(L) 0.3  Alkaline Phos 38 - 126 U/L 82 69 64  AST 15 - 41 U/L '23 28 16  ' ALT 0 - 44 U/L 9 <6 7   SPEP M-protein  09/15/2014: 4.2 10/08/14: 4.6 12/08/2014: 0.2 02/15/2015: not sufficient sample for test   09/11/2015: not observed 10/09/2015: not det   11/06/2015: not det  12/25/2015: not det  02/13/2016: 0.3 03/12/2016: 0.8 04/09/2016: 0.6 05/08/16: 0.1 06/04/16: 0.1 07/02/2016: 0.2 07/09/2016: 0.2 (dara specific IFE negative) 08/05/16: Not observed  09/01/16: Not observed 09/29/16: 0.1(dara specific IFE negative) 10/27/16: Not  observed 11/24/16 : Not observed 12/23/16: 0.1 (Dara specific IFE showed IgG monoclonal protein with lambda light chain) 01/21/2017: 0.1 (Dara specific IFE showed IgG monoclonal protein with lambda light chain) 03/26/17: 0.2 04/27/17: 0.1 05/25/17: Not observed 06/23/17: Not observed 08/31/17: 0.5 09/07/17: not observed  09/22/17: PENDING   IgG mg/dl  11/03/2014: 3150  12/08/2014:  759 02/14/2014: 860 09/11/2015: 1347 10/09/2015: 1457 11/06/2015: 1504 12/25/2015: 1400 02/13/2016: 1543 03/12/2016: 1860 04/09/2016: 1620 05/08/16: 749 06/04/16: 751 5/30/20187: 796 07/09/2016: 701 08/05/16: 678 09/01/16: 650 09/29/16: 727 10/27/16: 634 11/24/16: 685  12/23/16: 714 01/21/2017: 709 03/26/17: 874 04/27/17: 1067 05/25/17: 1161 06/23/17: 1211 08/17/17: 1336 08/31/17: 1438  Kappa/lambda light chains levels and ration  11/03/14: 0.75, 152, 0.00 12/22/2014: 1.10, 2.60, 0.42 02/15/2015: 9.59,14.35, 0.67 09/11/2015: 2.44, 2.72, 0.90 10/09/2015: 3.09, 3.49, 0.89 11/06/2015: 2.30, 2.62, 0.88 12/25/2015: 2.51, 3.0, 0.84 02/13/2016: 2.64, 15.3, 0.17 03/12/2016: 2.27, 45.5, 0.05 04/09/2016: 3.33, 45.5, 0.07 05/08/2016: 0.49, 1.21, 0.40 06/04/2016: 0.83, 1.11, 0.75 07/02/16: 0.51, 1.43, 0.36 07/23/16: 0.68, 1.02, 0.67 08/05/16: 0.62, 1.09, 0.57 09/01/16: 0.80, 1.21, 0.66 09/29/16: 0.44, 1.07, 0.41 10/27/16: 0.63, 1.44, 0.44 11/24/16: 0.83, 2.13, 0.39 12/23/16: 0.69, 2.31, 0.30 01/21/2017: 0.77, 3.08, 0.25   24 h urine UPEP/IFE and light chain: 11/03/2014: IFE showed a monoclonal IgG heavy chain with associated lambda light chain. M protein was Undetectable    PATHOLOGY   PATHOLOGY REPORT  Bone Marrow (BM) and Peripheral Blood (PB) FINAL PATHOLOGIC DIAGNOSIS  BONE MARROW: 07/11/2015 Hypocellular marrow (20%) with no increase in plasma cells (2%). See comment. PERIPHERAL BLOOD: Mild anemia. No circulating plasma cells identified. See comment and CBC data.  BONE MARROW: 02/22/2016 Diagnosis Bone Marrow,  Aspirate,Biopsy, and Clot, right iliac - NORMOCELLULAR BONE MARROW FOR AGE WITH TRILINEAGE HEMATOPOIESIS. - PLASMACYTOSIS (PLASMA CELLS 12%). - SEE COMMENT. PERIPHERAL BLOOD: - OCCASIONAL CIRCULATING PLASMA CELLS.   PROCEDURES  Bone Marrow Biopsy, 01/21/17 Bone Marrow, Aspirate, Biopsy, and Clot - NORMOCELLULAR BONE MARROW FOR AGE WITH TRILINEAGE HEMATOPOIESIS AND  2% PLASMA CELLS. PERIPHERAL BLOOD: - NORMOCYTIC-HYPOCHROMIC ANEMIA.     RADIOGRAPHIC STUDIES: I have personally reviewed the radiological images as listed and agreed with the findings in the report. Dg Chest 2 View  Result Date: 09/22/2017 CLINICAL DATA:  Cough and congestion.  Fever.  Sore throat. EXAM: CHEST - 2 VIEW COMPARISON:  08/28/2017. FINDINGS: PowerPort catheter noted with tip over cavoatrial junction. Heart size stable. Mild infiltrate in the lateral portion of the right lung base noted. Findings most likely secondary to pneumonia. Pulmonary infarct cannot be excluded. Previously identified medial right base infiltrate has largely cleared with mild residual subsegmental atelectasis. Small right pleural effusion IMPRESSION: Mild infiltrate the lateral portion of the right lung base noted. Findings most likely secondary to pneumonia. Pulmonary infarct cannot be excluded. Previously identified medial right base infiltrate has largely cleared with mild residual subsegmental atelectasis. Small right pleural effusion. Electronically Signed   By: Marcello Moores  Register   On: 09/22/2017 16:41     ASSESSMENT & PLAN: 47 y.o. [place] woman, presented with anemia and leokocytosis   1. Acute productive cough, pharyngitis, fatigue, vomiting, and possible fever  2.  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 transaminitis and hyperbilirubinemia, secondary to hep B flare  4. Type 2 DM, steroid induced  5. GERD, history of GI bleeding and nausea  6. Constipation   Ms. Crossan appears ill but stable for  outpatient management. She developed acute respiratory illness 4 days ago. Labs reviewed. Flu PCR is negative. Today's chest xray shows mild infiltrate in lateral right lung base, likely representing pneumonia. Will treat with levaquin 750 mg daily x1 week. Previous medial right base infiltrate has largely cleared; she completed cefdinir and azithromycin.   For vomiting, will support with 1 L NS over 2 hours and IV zofran today.   Will hold carfilzomib and pomalidomide. She will return in 1 week for f/u and possible chemotherapy. I strongly encouraged her to call if symptoms worsen or do not improve on antibiotics.   Will f/u with CXR today by phone.   PLAN: -Labs reviewed, flu negative  -Hold chemotherapy for respiratory illness  -Chest xray today shows right lung base infiltrate, likely represents pneumonia  -Levaquin 750 mg daily x7 days, prescribed today  -1 L NS over 2 hours with IV zofran today    Orders Placed This Encounter  Procedures  . DG Chest 2 View    Standing Status:   Future    Number of Occurrences:   1    Standing Expiration Date:   09/22/2018    Order Specific Question:   Reason for Exam (SYMPTOM  OR DIAGNOSIS REQUIRED)    Answer:   productive cough, possible fever, recent pneumonia    Order Specific Question:   Is patient pregnant?    Answer:   No    Order Specific Question:   Preferred imaging location?    Answer:   [facility]    Order Specific Question:   Radiology Contrast Protocol - do NOT remove file path    Answer:   \\[email or web address]  . Influenza panel by PCR (type A & B)    Standing Status:   Future    Number of Occurrences:   1    Standing Expiration Date:   09/23/2018   All questions were answered. The patient knows to call the clinic with any problems, questions or concerns. No barriers to learning was detected. I spent 20 minutes counseling the patient  face to face. The total time spent in the  appointment was 25 minutes and more than 50% was on counseling and review of test results     Alla Feeling, NP 09/22/17

## 2017-09-23 ENCOUNTER — Telehealth: Payer: Self-pay | Admitting: Hematology

## 2017-09-23 LAB — PROTEIN ELECTROPHORESIS, SERUM
A/G Ratio: 1 (ref 0.7–1.7)
ALPHA-2-GLOBULIN: 0.8 g/dL (ref 0.4–1.0)
Albumin ELP: 3.2 g/dL (ref 2.9–4.4)
Alpha-1-Globulin: 0.3 g/dL (ref 0.0–0.4)
BETA GLOBULIN: 0.9 g/dL (ref 0.7–1.3)
GAMMA GLOBULIN: 1.3 g/dL (ref 0.4–1.8)
Globulin, Total: 3.3 g/dL (ref 2.2–3.9)
Total Protein ELP: 6.5 g/dL (ref 6.0–8.5)

## 2017-09-23 NOTE — Telephone Encounter (Signed)
Unable to schedule appts. Added to infusion log per 8/20 los

## 2017-09-24 LAB — IFE, DARA-SPECIFIC, SERUM
IGA: 243 mg/dL (ref 87–352)
IGG (IMMUNOGLOBIN G), SERUM: 1410 mg/dL (ref 700–1600)
IgM (Immunoglobulin M), Srm: 60 mg/dL (ref 26–217)

## 2017-09-29 ENCOUNTER — Telehealth: Payer: Self-pay | Admitting: Hematology

## 2017-09-29 ENCOUNTER — Inpatient Hospital Stay: Payer: Medicaid Other

## 2017-09-29 ENCOUNTER — Ambulatory Visit (HOSPITAL_COMMUNITY)
Admission: RE | Admit: 2017-09-29 | Discharge: 2017-09-29 | Disposition: A | Discharge status: Home / Self Care | Payer: Medicaid Other | Source: Ambulatory Visit | Attending: Nurse Practitioner | Admitting: Nurse Practitioner

## 2017-09-29 ENCOUNTER — Other Ambulatory Visit: Payer: Self-pay

## 2017-09-29 ENCOUNTER — Encounter: Payer: Self-pay | Admitting: Nurse Practitioner

## 2017-09-29 ENCOUNTER — Inpatient Hospital Stay (HOSPITAL_BASED_OUTPATIENT_CLINIC_OR_DEPARTMENT_OTHER): Payer: Medicaid Other | Admitting: Nurse Practitioner

## 2017-09-29 VITALS — BP 124/87 | HR 70 | Temp 97.7°F | Resp 18 | Ht 62.0 in | Wt 127.2 lb

## 2017-09-29 DIAGNOSIS — K219 Gastro-esophageal reflux disease without esophagitis: Secondary | ICD-10-CM

## 2017-09-29 DIAGNOSIS — R05 Cough: Secondary | ICD-10-CM

## 2017-09-29 DIAGNOSIS — J029 Acute pharyngitis, unspecified: Secondary | ICD-10-CM

## 2017-09-29 DIAGNOSIS — C9012 Plasma cell leukemia in relapse: Secondary | ICD-10-CM | POA: Diagnosis not present

## 2017-09-29 DIAGNOSIS — R740 Nonspecific elevation of levels of transaminase and lactic acid dehydrogenase [LDH]: Secondary | ICD-10-CM

## 2017-09-29 DIAGNOSIS — R918 Other nonspecific abnormal finding of lung field: Secondary | ICD-10-CM

## 2017-09-29 DIAGNOSIS — Z5112 Encounter for antineoplastic immunotherapy: Secondary | ICD-10-CM | POA: Diagnosis not present

## 2017-09-29 DIAGNOSIS — C9010 Plasma cell leukemia not having achieved remission: Secondary | ICD-10-CM | POA: Insufficient documentation

## 2017-09-29 DIAGNOSIS — B181 Chronic viral hepatitis B without delta-agent: Secondary | ICD-10-CM

## 2017-09-29 DIAGNOSIS — E099 Drug or chemical induced diabetes mellitus without complications: Secondary | ICD-10-CM

## 2017-09-29 DIAGNOSIS — K5900 Constipation, unspecified: Secondary | ICD-10-CM

## 2017-09-29 DIAGNOSIS — R112 Nausea with vomiting, unspecified: Secondary | ICD-10-CM

## 2017-09-29 DIAGNOSIS — R5383 Other fatigue: Secondary | ICD-10-CM

## 2017-09-29 DIAGNOSIS — J069 Acute upper respiratory infection, unspecified: Secondary | ICD-10-CM

## 2017-09-29 DIAGNOSIS — J988 Other specified respiratory disorders: Secondary | ICD-10-CM

## 2017-09-29 DIAGNOSIS — D709 Neutropenia, unspecified: Secondary | ICD-10-CM

## 2017-09-29 DIAGNOSIS — Z95828 Presence of other vascular implants and grafts: Secondary | ICD-10-CM

## 2017-09-29 LAB — CBC WITH DIFFERENTIAL (CANCER CENTER ONLY)
BASOS PCT: 1 %
Basophils Absolute: 0 10*3/uL (ref 0.0–0.1)
EOS ABS: 0.1 10*3/uL (ref 0.0–0.5)
Eosinophils Relative: 3 %
HEMATOCRIT: 34.2 % — AB (ref 34.8–46.6)
Hemoglobin: 10.6 g/dL — ABNORMAL LOW (ref 11.6–15.9)
Lymphocytes Relative: 38 %
Lymphs Abs: 1.1 10*3/uL (ref 0.9–3.3)
MCH: 28.2 pg (ref 25.1–34.0)
MCHC: 31 g/dL — AB (ref 31.5–36.0)
MCV: 91 fL (ref 79.5–101.0)
MONO ABS: 0.6 10*3/uL (ref 0.1–0.9)
MONOS PCT: 23 %
Neutro Abs: 1 10*3/uL — ABNORMAL LOW (ref 1.5–6.5)
Neutrophils Relative %: 35 %
Platelet Count: 244 10*3/uL (ref 145–400)
RBC: 3.76 MIL/uL (ref 3.70–5.45)
RDW: 16.8 % — ABNORMAL HIGH (ref 11.2–14.5)
WBC Count: 2.8 10*3/uL — ABNORMAL LOW (ref 3.9–10.3)

## 2017-09-29 LAB — CMP (CANCER CENTER ONLY)
ALBUMIN: 3.5 g/dL (ref 3.5–5.0)
ALK PHOS: 71 U/L (ref 38–126)
AST: 22 U/L (ref 15–41)
Anion gap: 6 (ref 5–15)
BUN: 13 mg/dL (ref 6–20)
CALCIUM: 10 mg/dL (ref 8.9–10.3)
CO2: 27 mmol/L (ref 22–32)
CREATININE: 0.75 mg/dL (ref 0.44–1.00)
Chloride: 109 mmol/L (ref 98–111)
GFR, Est AFR Am: >60 mL/min (ref >60)
GFR, Estimated: >60 mL/min (ref >60)
Glucose, Bld: 107 mg/dL — ABNORMAL HIGH (ref 70–99)
Potassium: 3.6 mmol/L (ref 3.5–5.1)
SODIUM: 142 mmol/L (ref 135–145)
Total Bilirubin: 0.3 mg/dL (ref 0.3–1.2)
Total Protein: 7.8 g/dL (ref 6.5–8.1)

## 2017-09-29 LAB — PREGNANCY, URINE: Preg Test, Ur: NEGATIVE

## 2017-09-29 MED ORDER — SODIUM CHLORIDE 0.9% FLUSH
10.0000 mL | INTRAVENOUS | Status: DC | PRN
  Administered 2017-09-29: 10 mL
  Filled 2017-09-29: qty 10

## 2017-09-29 MED ORDER — SODIUM CHLORIDE 0.9 % IJ SOLN
10.0000 mL | INTRAMUSCULAR | Status: DC | PRN
  Filled 2017-09-29: qty 10

## 2017-09-29 MED ORDER — HEPARIN SOD (PORK) LOCK FLUSH 100 UNIT/ML IV SOLN
500.0000 [IU] | Freq: Once | INTRAVENOUS | Status: AC | PRN
  Administered 2017-09-29: 500 [IU] via INTRAVENOUS
  Filled 2017-09-29: qty 5

## 2017-09-29 MED ORDER — POMALIDOMIDE 2 MG PO CAPS: 2.0000 mg | ORAL_CAPSULE | Freq: Every day | ORAL | 0 refills | Status: DC

## 2017-09-29 NOTE — Progress Notes (Signed)
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    10/10/2014 Miscellaneous    Peripheral blood chemistry and leukocytosis with total white count 78K, comprised of large plasma cells and his normocytic anemia. There is a myeloid left shift with previous surgical radium blasts. Flow cytometry showed 64% plasma cells    10/10/2014 Bone Marrow Biopsy    Markedly hypercellular marrow (95%), Atypical plasma cells comprise 57% of the cellularity. There was diminished multilineage in hematopoiesis with adequate maturation. Breasts less than 1%), no overt dysplasia of the myeloid or erythroid lineages.     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    05/07/2015 - 05/12/2015 Hospital Admission    [facility], [facility]. ID workup was negative, EGD showed evidence of gastritis and duodenitis, no H. pylori or CMV.    05/20/2015 - 05/24/2015  Hospital Admission    [facility]    07/11/2015 Bone Marrow Biopsy    Post transplant 100 a bone marrow biopsy showed hypocellular marrow, 20%, no increase in plasma cells (2%) or other abnormalities.    08/22/2015 - 01/02/2016 Chemotherapy    MaintenanceCyborD (cytoxan 3033mm2 iv, bortezomib 1.5 mg/m, dexamethasone 40 mg, every 2 weeks, changed to Velcade maintenance after 4 months treatment    01/16/2016 - 04/19/2016 Chemotherapy    Maintenance Velcade 1.3 mg/m every 2 weeks    02/22/2016 Pathology Results    BONE MARROW: Diagnosis Bone Marrow, Aspirate,Biopsy, and Clot, right iliac - NORMOCELLULAR BONE MARROW FOR AGE WITH TRILINEAGE HEMATOPOIESIS. - PLASMACYTOSIS (PLASMA CELLS 12%). - SEE COMMENT. PERIPHERAL BLOOD: - OCCASIONAL CIRCULATING PLASMA CELLS.    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     04/18/2016 - 03/26/2017 Chemotherapy    Daratumumab per protocol  CyBorD every week, cytoxan was held after 04/24/2016 dye to cytopenia and infection  -discontinued due to Hep B flare    05/11/2016 - 05/16/2016 Hospital Admission    Healthcare-associated pneumonia    02/16/2017 - 02/21/2017 Hospital Admission    Admission diagnosis: Abnormal LFT's Additional comments: Assoc diagnoses: Hep B flare, transminitis, dehydration, fever, SIRS    04/06/2017 -  Chemotherapy    Carfilzomib 56 mg/m2 on days 1 and 15 (except 2014m2 on C1D1 and C1D2) with dexamethasone 20 mg on same days and pomalidomide 2 mg on days 1-21 of 28 day cycle        08/26/2017 - 08/28/2017 Hospital Admission    Admit  date: 08/26/2017 Admission diagnosis: RLL Pneumonia    CURRENT THERAPY: carfilzomib 56 mg/m2 on days 1 and 15(except 42m/m2 on C1D1 and C1D2)with dexamethasone 20 mg on same days and pomalidomide 2 mg on days 1-21 of 28 day cycleon 04/13/17, dexa stopped on 07/06/2017 due to her CR    INTERVAL HISTORY: Ms. HAlviarreturns for follow up and chemo. She was seen 09/22/17, treatment was held for URI; she was treated with po levaquin for suspected pneumonia based on symptoms and infiltrate in the right lung base. She returns today by herself for evaluation to resume chemo. Video interpreter was used. [facility] She feels better than last week. She is still on antibiotics, but discontinued for 2 days due to moderate fatigue and has 2 tabs left. She restarted after seeing Dr. RNorma Fredricksonyesterday. Her non-productive cough is improved, now only at night. Throat gets irritated with cough. No fever or chills, chest pain, dyspnea, vomiting, or diarrhea. Occasional nausea is controlled with PRN compazine. Appetite fluctuates, but able to drink adequate fluids. Chronic constipation is unchanged. Denies pain.   REVIEW OF SYSTEMS:   Constitutional: Denies fevers, chills or abnormal weight loss (+) fatigue (+) appetite fluctuates  Ears, nose, mouth, throat, and face: Denies mucositis (+) sore throat with cough  Respiratory: Denies dyspnea or wheezes (+) intermittent dry cough, improving  Cardiovascular: Denies palpitation, chest discomfort or lower extremity swelling Gastrointestinal:  Denies vomiting, diarrhea, heartburn or change in bowel habits (+) chronic constipation (+) intermittent nausea  Skin: Denies abnormal skin rashes Lymphatics: Denies new lymphadenopathy or easy bruising Neurological:Denies numbness, tingling or new weaknesses All other systems were reviewed with the patient and are negative.  MEDICAL HISTORY:  Past Medical History:  Diagnosis Date  . Chills with fever    intermittently since d/c from hospital  . Dysuria-frequency syndrome    w/ pink urine  . GERD (gastroesophageal reflux disease)   . Hepatitis   . History of positive PPD    DX 2011--  CXR DONE NO EVIDENCE  . History of ureter stent   . Hydronephrosis, right   .  Neuromuscular disorder ([facility])    legs numb intermittently  . 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   . Pneumonia   . Right ureteral stone   . Urosepsis 8/14   [facility]    SURGICAL HISTORY: Past Surgical History:  Procedure Laterality Date  . CYSTOSCOPY W/ URETERAL STENT PLACEMENT Right 09/25/2012   Procedure: CYSTOSCOPY WITH RETROGRADE PYELOGRAM/URETERAL STENT PLACEMENT;  Surgeon: TAlexis Frock MD;  Location: [facility];  Service: Urology;  Laterality: Right;  . CYSTOSCOPY WITH RETROGRADE PYELOGRAM, URETEROSCOPY AND STENT PLACEMENT Right 10/15/2012   Procedure: CYSTOSCOPY WITH RETROGRADE PYELOGRAM, URETEROSCOPY AND REMOVAL STENT WITH  STENT PLACEMENT;  Surgeon: TAlexis Frock MD;  [facility]  Service: Urology;  Laterality: Right;  . ESOPHAGOGASTRODUODENOSCOPY (EGD) WITH PROPOFOL N/A 11/16/2014   Procedure: ESOPHAGOGASTRODUODENOSCOPY (EGD) WITH PROPOFOL;  Surgeon: DMilus Banister MD;  Location: [facility];  Service: Endoscopy;  Laterality: N/A;  . HOLMIUM LASER APPLICATION Right [id number]/63/7858  Procedure: HOLMIUM LASER APPLICATION;  Surgeon: TAlexis Frock MD;  [facility]  Service: Urology;  Laterality: Right;  . LIVER BIOPSY    . OTHER SURGICAL HISTORY Right    removal of ovarian cyst  . removal of uterine cyst     years ago  . RIGHT VATS W/ DRAINAGE PEURAL EFFUSION AND BX'S  10-30-2008  I have reviewed the social history and family history with the patient and they are unchanged from previous note.  ALLERGIES:  has No Known Allergies.  MEDICATIONS:  Current Outpatient Medications  Medication Sig Dispense Refill  . acyclovir (ZOVIRAX) 800 MG tablet Take 1 tablet (800 mg total) by mouth 2 (two) times daily. 60 tablet 5  . chlorpheniramine-HYDROcodone (TUSSIONEX) 10-8 MG/5ML SUER Take 5 mLs by mouth every 12 (twelve) hours as needed for cough. 140 mL 0  . feeding supplement, ENSURE ENLIVE, (ENSURE ENLIVE) LIQD Take 237 mLs by mouth 2  (two) times daily between meals. 237 mL 12  . ibuprofen (ADVIL,MOTRIN) 200 MG tablet Take 200 mg by mouth every 6 (six) hours as needed for moderate pain.    Marland Kitchen levofloxacin (LEVAQUIN) 750 MG tablet Take 1 tablet (750 mg total) by mouth daily. 7 tablet 0  . magic mouthwash SOLN Take 5 mLs by mouth 4 (four) times daily as needed for mouth pain. 240 mL 3  . omeprazole (PRILOSEC) 20 MG capsule Take 1 capsule (20 mg total) by mouth daily. 30 capsule 5  . ondansetron (ZOFRAN) 8 MG tablet Take 1 tablet (8 mg total) by mouth every 8 (eight) hours as needed for nausea or vomiting. 30 tablet 1  . polyethylene glycol (MIRALAX / GLYCOLAX) packet Take 17 g by mouth daily as needed (constipation). 14 each 1  . potassium chloride SA (K-DUR,KLOR-CON) 20 MEQ tablet Take 1 tablet (20 mEq total) by mouth daily. 30 tablet 0  . prochlorperazine (COMPAZINE) 10 MG tablet Take 1 tablet (10 mg total) by mouth every 6 (six) hours as needed for nausea or vomiting. 30 tablet 3  . promethazine (PHENERGAN) 25 MG tablet Take 1 tablet (25 mg total) by mouth every 6 (six) hours as needed for nausea or vomiting. 30 tablet 0  . ursodiol (ACTIGALL) 300 MG capsule Take 1 capsule (300 mg total) by mouth 2 (two) times daily. 60 capsule 0  . VIREAD 300 MG tablet Take 1 tablet (300 mg total) by mouth daily. 30 tablet 11  . amoxicillin-clavulanate (AUGMENTIN) 875-125 MG tablet Take 1 tablet by mouth 2 (two) times daily. 10 tablet 0  . azithromycin (ZITHROMAX) 250 MG tablet Take 2 tabs on day 1 then 1 tablet daily for 4 days 6 each 0  . pomalidomide (POMALYST) 2 MG capsule Take 1 capsule (2 mg total) by mouth daily. Take with water on days 1-21. Repeat every 28 days. 21 capsule 0   Current Facility-Administered Medications  Medication Dose Route Frequency Provider Last Rate Last Dose  . sodium chloride 0.9 % injection 10 mL  10 mL Intravenous PRN Truitt Merle, MD      . sodium chloride flush (NS) 0.9 % injection 10 mL  10 mL Intracatheter PRN  Truitt Merle, MD   10 mL at 09/29/17 1150   Facility-Administered Medications Ordered in Other Visits  Medication Dose Route Frequency Provider Last Rate Last Dose  . sodium chloride 0.9 % injection 10 mL  10 mL Intravenous PRN Truitt Merle, MD   10 mL at 12/11/15 1532  . sodium chloride 0.9 % injection 10 mL  10 mL Intravenous PRN Truitt Merle, MD   10 mL at 06/11/16 1505  . sodium chloride 0.9 % injection 10 mL  10 mL Intravenous PRN Truitt Merle, MD   10 mL at 08/04/17 1643  . sodium chloride flush (NS) 0.9 % injection 10 mL  10 mL Intravenous PRN Truitt Merle, MD   10  mL at 10/09/15 1717    PHYSICAL EXAMINATION: ECOG PERFORMANCE STATUS: 2-3  Vitals:   09/29/17 1050  BP: 124/87  Pulse: 70  Resp: 18  Temp: 97.7 F (36.5 C)  SpO2: 100%   Filed Weights   09/29/17 1050  Weight: 127 lb 3.2 oz (57.7 kg)    GENERAL:alert, no distress and comfortable SKIN: skin color, texture, turgor are normal, no rashes or significant lesions EYES: sclera clear OROPHARYNX:no thrush or ulcers  NECK: supple, thyroid normal size, non-tender, without nodularity LYMPH:  no palpable lymphadenopathy in the cervical, axillary or inguinal LUNGS: distant breath sounds with rales in bases, normal breathing effort HEART: regular rate & rhythm, no lower extremity edema ABDOMEN:abdomen soft, non-tender and normal bowel sounds Musculoskeletal: no cyanosis of digits and no clubbing  NEURO: alert & oriented x 3 with fluent speech, no focal motor/sensory deficits PAC without erythema    LABORATORY DATA:  I have reviewed the data as listed CBC Latest Ref Rng & Units 09/29/2017 09/22/2017 09/07/2017  WBC 3.9 - 10.3 K/uL 2.8(L) 4.5 2.7(L)  Hemoglobin 11.6 - 15.9 g/dL 10.6(L) 10.0(L) 9.7(L)  Hematocrit 34.8 - 46.6 % 34.2(L) 31.7(L) 29.9(L)  Platelets 145 - 400 K/uL 244 180 316     CMP Latest Ref Rng & Units 09/29/2017 09/22/2017 09/07/2017  Glucose 70 - 99 mg/dL 107(H) 112(H) 127(H)  BUN 6 - 20 mg/dL _0 Creatinine 0.44 -  1.00 mg/dL 0.75 0.79 0.75  Sodium 135 - 145 mmol/L 142 139 140  Potassium 3.5 - 5.1 mmol/L 3.6 3.9 3.5  Chloride 98 - 111 mmol/L 109 107 107  CO2 22 - 32 mmol/L _1 Calcium 8.9 - 10.3 mg/dL 10.0 8.9 9.0  Total Protein 6.5 - 8.1 g/dL 7.8 7.3 7.2  Total Bilirubin 0.3 - 1.2 mg/dL 0.3 0.5 0.2(L)  Alkaline Phos 38 - 126 U/L 71 82 69  AST 15 - 41 U/L _2 ALT 0 - 44 U/L <6 9 <6      RADIOGRAPHIC STUDIES: I have personally reviewed the radiological images as listed and agreed with the findings in the report. Dg Chest 2 View  Result Date: 09/29/2017 CLINICAL DATA:  Congestive cough, hx infiltrates seen on xray x 7 days ago, hx remission of plasma cell leukemia 09/22/2017 EXAM: CHEST - 2 VIEW COMPARISON:  09/22/2017 FINDINGS: Patient has a RIGHT-sided PowerPort, tip to the UPPER RIGHT atrium. Heart is normal in size. There is persistent patchy density in the LATERAL RIGHT middle lobe. No pulmonary edema. IMPRESSION: Persistent infiltrate in the RIGHT middle lobe. Followup PA and lateral chest X-ray is recommended in 3-4 weeks following trial of antibiotic therapy to ensure resolution and exclude underlying malignancy. Electronically Signed   By: Nolon Nations M.D.   On: 09/29/2017 17:19     ASSESSMENT & PLAN: 47 y.o.[place] woman, presented with anemia and leokocytosis   1. Acute productive cough, pharyngitis, fatigue, vomiting, and possible fever, chest xray consistent with pnuemonia 2. 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 transaminitis and hyperbilirubinemia, secondary to hep B flare  4. Type 2 DM, steroid induced  5. GERD, history of GI bleeding and nausea  6. Constipation   Ms. Woehl appears improved today; cough is improving, no recent fever. She continues levaquin, she briefly stopped due to fatigue but restarted after seeing Dr. Norma Fredrickson yesterday. Although she has clinically improved, she is neutropenic, likely due to acute infection. CBC  and CMP otherwise stable.  I recommend to continue to hold treatment today, and r/s in 1 week. Repeat chest xray today shows persistent infiltrates in the right lung.  I will change her antibiotics to augmentin and azithromycin.   Per 8/20 M protein remains undetected. Pending today. She will return for lab, kyprolis, and zometa in 1 and 3 weeks, she will restart pomalidomide when she returns next week for chemo infusion. Will f/u in 3 weeks or sooner if needed. Patient understands and agrees with the plan. I will refill pomalidomide pending negative pregnancy test.   PLAN: -Hold chemotherapy  -Repeat Chest xray today shows persistent infiltrate in the right lung. -discontinue levaquin, begin augmentin and azithromycin  -Refill pomalidomide, begin next week with kyprolis    Orders Placed This Encounter  Procedures  . DG Chest 2 View    Standing Status:   Future    Number of Occurrences:   1    Standing Expiration Date:   09/29/2018    Order Specific Question:   Reason for Exam (SYMPTOM  OR DIAGNOSIS REQUIRED)    Answer:   infiltrates seen on chest xray 8/20, monitor for improvement    Order Specific Question:   Is patient pregnant?    Answer:   No    Order Specific Question:   Preferred imaging location?    Answer:   [facility]    Order Specific Question:   Radiology Contrast Protocol - do NOT remove file path    Answer:   [email or web address]  . SCHEDULING COMMUNICATION INJECTION    Schedule port flush appointment   All questions were answered. The patient knows to call the clinic with any problems, questions or concerns. No barriers to learning was detected. I spent 20 minutes counseling the patient face to face. The total time spent in the appointment was 25 minutes and more than 50% was on counseling and review of test results     Alla Feeling, NP 09/30/17

## 2017-09-29 NOTE — Telephone Encounter (Signed)
Appts scheduled AVS/Calendar printed per 8/27 los °

## 2017-09-29 NOTE — Telephone Encounter (Signed)
Appts scheduled AVS/Calendar declined/ pt added to infusion log for week of 9/3 and will be notified once appts have been added per 8/27 los

## 2017-09-30 ENCOUNTER — Telehealth: Payer: Self-pay

## 2017-09-30 ENCOUNTER — Telehealth: Payer: Self-pay | Admitting: Hematology

## 2017-09-30 ENCOUNTER — Encounter: Payer: Self-pay | Admitting: Nurse Practitioner

## 2017-09-30 LAB — IFE, DARA-SPECIFIC, SERUM
IGA: 297 mg/dL (ref 87–352)
IGM (IMMUNOGLOBULIN M), SRM: 66 mg/dL (ref 26–217)
IgG (Immunoglobin G), Serum: 1761 mg/dL — ABNORMAL HIGH (ref 700–1600)

## 2017-09-30 LAB — PROTEIN ELECTROPHORESIS, SERUM
A/G RATIO SPE: 0.8 (ref 0.7–1.7)
ALBUMIN ELP: 3.3 g/dL (ref 2.9–4.4)
Alpha-1-Globulin: 0.3 g/dL (ref 0.0–0.4)
Alpha-2-Globulin: 0.9 g/dL (ref 0.4–1.0)
BETA GLOBULIN: 1.1 g/dL (ref 0.7–1.3)
GLOBULIN, TOTAL: 4 g/dL — AB (ref 2.2–3.9)
Gamma Globulin: 1.7 g/dL (ref 0.4–1.8)
TOTAL PROTEIN ELP: 7.3 g/dL (ref 6.0–8.5)

## 2017-09-30 MED ORDER — AZITHROMYCIN 250 MG PO TABS: ORAL_TABLET | ORAL | 0 refills | Status: DC

## 2017-09-30 MED ORDER — AMOXICILLIN-POT CLAVULANATE 875-125 MG PO TABS: 1.0000 | ORAL_TABLET | Freq: Two times a day (BID) | ORAL | 0 refills | Status: DC

## 2017-09-30 NOTE — Telephone Encounter (Signed)
Spoke with patient's daughter to give her mother a message, chest x-ray shows signs of pneumonia, stop taking Levaquin, two new prescriptions have been sent in Augmentin and Azithromycin she needs to start taking these.

## 2017-09-30 NOTE — Telephone Encounter (Signed)
Faxed script for [employer], [employer] authorization and copy of pregnancy test result to [employer].

## 2017-09-30 NOTE — Telephone Encounter (Signed)
LMVM for patient regarding appointments added to her schedule per 8/27 los

## 2017-09-30 NOTE — Telephone Encounter (Signed)
-----   Message from Alla Feeling, NP sent at 09/30/2017  2:48 PM EDT ----- My nurse,  Please call her and let her know chest xray shows persistent signs of pneumonia. Stop levaquin. I called in 2 new prescriptions, augmentin and azithromycin. Please reviewe how to take these.   Kenney Houseman, Thanks for getting her scheduled on 9/4. Please move 9/25 appointments to the week prior (her treatments are 2 weeks apart).   Thanks, Regan Rakers NP

## 2017-10-07 ENCOUNTER — Inpatient Hospital Stay: Payer: Medicaid Other

## 2017-10-07 ENCOUNTER — Inpatient Hospital Stay: Payer: Medicaid Other | Attending: Hematology

## 2017-10-07 VITALS — BP 103/86 | HR 82 | Temp 97.9°F | Resp 18

## 2017-10-07 DIAGNOSIS — Z23 Encounter for immunization: Secondary | ICD-10-CM | POA: Diagnosis not present

## 2017-10-07 DIAGNOSIS — Z95828 Presence of other vascular implants and grafts: Secondary | ICD-10-CM

## 2017-10-07 DIAGNOSIS — C9012 Plasma cell leukemia in relapse: Secondary | ICD-10-CM | POA: Diagnosis present

## 2017-10-07 DIAGNOSIS — C9010 Plasma cell leukemia not having achieved remission: Secondary | ICD-10-CM

## 2017-10-07 DIAGNOSIS — Z5112 Encounter for antineoplastic immunotherapy: Secondary | ICD-10-CM | POA: Diagnosis present

## 2017-10-07 LAB — CMP (CANCER CENTER ONLY)
ALK PHOS: 65 U/L (ref 38–126)
ANION GAP: 6 (ref 5–15)
AST: 21 U/L (ref 15–41)
Albumin: 3.2 g/dL — ABNORMAL LOW (ref 3.5–5.0)
BUN: 15 mg/dL (ref 6–20)
CALCIUM: 9.2 mg/dL (ref 8.9–10.3)
CO2: 27 mmol/L (ref 22–32)
CREATININE: 0.66 mg/dL (ref 0.44–1.00)
Chloride: 110 mmol/L (ref 98–111)
Glucose, Bld: 87 mg/dL (ref 70–99)
Potassium: 3.5 mmol/L (ref 3.5–5.1)
Sodium: 143 mmol/L (ref 135–145)
TOTAL PROTEIN: 7.2 g/dL (ref 6.5–8.1)

## 2017-10-07 LAB — CBC WITH DIFFERENTIAL (CANCER CENTER ONLY)
Basophils Absolute: 0 10*3/uL (ref 0.0–0.1)
Basophils Relative: 1 %
Eosinophils Absolute: 0.1 10*3/uL (ref 0.0–0.5)
Eosinophils Relative: 2 %
HEMATOCRIT: 31.8 % — AB (ref 34.8–46.6)
HEMOGLOBIN: 10.2 g/dL — AB (ref 11.6–15.9)
LYMPHS ABS: 0.8 10*3/uL — AB (ref 0.9–3.3)
LYMPHS PCT: 25 %
MCH: 29 pg (ref 25.1–34.0)
MCHC: 32.1 g/dL (ref 31.5–36.0)
MCV: 90.5 fL (ref 79.5–101.0)
MONOS PCT: 9 %
Monocytes Absolute: 0.3 10*3/uL (ref 0.1–0.9)
NEUTROS PCT: 63 %
Neutro Abs: 1.9 10*3/uL (ref 1.5–6.5)
Platelet Count: 269 10*3/uL (ref 145–400)
RBC: 3.51 MIL/uL — AB (ref 3.70–5.45)
RDW: 18.9 % — ABNORMAL HIGH (ref 11.2–14.5)
WBC: 3 10*3/uL — AB (ref 3.9–10.3)

## 2017-10-07 MED ORDER — PROCHLORPERAZINE MALEATE 10 MG PO TABS
ORAL_TABLET | ORAL | Status: AC
  Filled 2017-10-07: qty 1

## 2017-10-07 MED ORDER — SODIUM CHLORIDE 0.9 % IV SOLN
Freq: Once | INTRAVENOUS | Status: AC
  Administered 2017-10-07: 09:00:00 via INTRAVENOUS
  Filled 2017-10-07: qty 250

## 2017-10-07 MED ORDER — SODIUM CHLORIDE 0.9% FLUSH
10.0000 mL | INTRAVENOUS | Status: DC | PRN
  Administered 2017-10-07: 10 mL
  Filled 2017-10-07: qty 10

## 2017-10-07 MED ORDER — ZOLEDRONIC ACID 4 MG/100ML IV SOLN
4.0000 mg | Freq: Once | INTRAVENOUS | Status: AC
  Administered 2017-10-07: 4 mg via INTRAVENOUS
  Filled 2017-10-07: qty 100

## 2017-10-07 MED ORDER — PROCHLORPERAZINE MALEATE 10 MG PO TABS
10.0000 mg | ORAL_TABLET | Freq: Once | ORAL | Status: AC
  Administered 2017-10-07: 10 mg via ORAL

## 2017-10-07 MED ORDER — DEXTROSE 5 % IV SOLN
56.0000 mg/m2 | Freq: Once | INTRAVENOUS | Status: AC
  Administered 2017-10-07: 90 mg via INTRAVENOUS
  Filled 2017-10-07: qty 30

## 2017-10-07 MED ORDER — SODIUM CHLORIDE 0.9 % IV SOLN
Freq: Once | INTRAVENOUS | Status: AC
  Administered 2017-10-07: 10:00:00 via INTRAVENOUS
  Filled 2017-10-07: qty 250

## 2017-10-07 MED ORDER — HEPARIN SOD (PORK) LOCK FLUSH 100 UNIT/ML IV SOLN
500.0000 [IU] | Freq: Once | INTRAVENOUS | Status: AC | PRN
  Administered 2017-10-07: 500 [IU]
  Filled 2017-10-07: qty 5

## 2017-10-07 MED ORDER — SODIUM CHLORIDE 0.9% FLUSH
10.0000 mL | INTRAVENOUS | Status: DC | PRN
  Administered 2017-10-07: 10 mL via INTRAVENOUS
  Filled 2017-10-07: qty 10

## 2017-10-07 NOTE — Patient Instructions (Signed)
[facility] Discharge Instructions for Patients Receiving Chemotherapy  Today you received the following chemotherapy agents Kyprolis, Zometa  To help prevent nausea and vomiting after your treatment, we encourage you to take your nausea medication as directed   If you develop nausea and vomiting that is not controlled by your nausea medication, call the clinic.   BELOW ARE SYMPTOMS THAT SHOULD BE REPORTED IMMEDIATELY:  *FEVER GREATER THAN 100.5 F  *CHILLS WITH OR WITHOUT FEVER  NAUSEA AND VOMITING THAT IS NOT CONTROLLED WITH YOUR NAUSEA MEDICATION  *UNUSUAL SHORTNESS OF BREATH  *UNUSUAL BRUISING OR BLEEDING  TENDERNESS IN MOUTH AND THROAT WITH OR WITHOUT PRESENCE OF ULCERS  *URINARY PROBLEMS  *BOWEL PROBLEMS  UNUSUAL RASH Items with * indicate a potential emergency and should be followed up as soon as possible.  Feel free to call the clinic should you have any questions or concerns. The clinic phone number is [phone].  Please show the [facility] at check-in to the Emergency Department and triage nurse.

## 2017-10-08 LAB — PROTEIN ELECTROPHORESIS, SERUM
A/G RATIO SPE: 1 (ref 0.7–1.7)
ALBUMIN ELP: 3.4 g/dL (ref 2.9–4.4)
Alpha-1-Globulin: 0.2 g/dL (ref 0.0–0.4)
Alpha-2-Globulin: 0.7 g/dL (ref 0.4–1.0)
BETA GLOBULIN: 0.9 g/dL (ref 0.7–1.3)
GLOBULIN, TOTAL: 3.3 g/dL (ref 2.2–3.9)
Gamma Globulin: 1.5 g/dL (ref 0.4–1.8)
TOTAL PROTEIN ELP: 6.7 g/dL (ref 6.0–8.5)

## 2017-10-09 LAB — IFE, DARA-SPECIFIC, SERUM
IGA: 262 mg/dL (ref 87–352)
IGM (IMMUNOGLOBULIN M), SRM: 58 mg/dL (ref 26–217)
IgG (Immunoglobin G), Serum: 1539 mg/dL (ref 700–1600)

## 2017-10-10 ENCOUNTER — Other Ambulatory Visit: Payer: Self-pay | Admitting: Hematology

## 2017-10-13 ENCOUNTER — Telehealth: Payer: Self-pay | Admitting: Nurse Practitioner

## 2017-10-13 NOTE — Telephone Encounter (Signed)
Called patient to see how she is feeling in light of recent respiratory infection and restarting chemotherapy. No answer on patient's or daughter's phones. Will re-attempt to reach patient at a later time.

## 2017-10-15 ENCOUNTER — Telehealth: Payer: Self-pay | Admitting: Nurse Practitioner

## 2017-10-15 NOTE — Telephone Encounter (Signed)
2nd attempt to reach patient to see how she is tolerating recent chemo. No answer. She is due to return next week for f/u and next treatment.

## 2017-10-20 NOTE — Progress Notes (Signed)
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    10/10/2014 Miscellaneous    Peripheral blood chemistry and leukocytosis with total white count 78K, comprised of large plasma cells and his normocytic anemia. There is a myeloid left shift with previous surgical radium blasts. Flow cytometry showed 64% plasma cells    10/10/2014 Bone Marrow Biopsy    Markedly hypercellular marrow (95%), Atypical plasma cells comprise 57% of the cellularity. There was diminished multilineage in hematopoiesis with adequate maturation. Breasts less than 1%), no overt dysplasia of the myeloid or erythroid lineages.     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    05/07/2015 - 05/12/2015 Hospital Admission    [facility], [facility] ID workup was negative, EGD showed evidence of gastritis and duodenitis, no H. pylori or CMV.    05/20/2015 - 05/24/2015 Hospital Admission   [facility]    07/11/2015 Bone Marrow Biopsy    Post transplant 100 a bone marrow biopsy showed hypocellular marrow, 20%, no increase in plasma cells (2%) or other abnormalities.    08/22/2015 - 01/02/2016 Chemotherapy    MaintenanceCyborD (cytoxan 3056mm2 iv, bortezomib 1.5 mg/m, dexamethasone 40 mg, every 2 weeks, changed to Velcade maintenance after 4 months treatment    01/16/2016 - 04/19/2016 Chemotherapy    Maintenance Velcade 1.3 mg/m every 2 weeks    02/22/2016 Pathology Results    BONE MARROW: Diagnosis Bone Marrow, Aspirate,Biopsy, and Clot, right iliac - NORMOCELLULAR BONE MARROW FOR AGE WITH TRILINEAGE HEMATOPOIESIS. - PLASMACYTOSIS (PLASMA CELLS 12%). - SEE COMMENT. PERIPHERAL BLOOD: - OCCASIONAL CIRCULATING PLASMA CELLS.    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     04/18/2016 - 03/26/2017 Chemotherapy    Daratumumab per protocol  CyBorD every week, cytoxan was held after 04/24/2016 dye to cytopenia and infection  -discontinued due to Hep B flare    05/11/2016 - 05/16/2016 Hospital Admission    Healthcare-associated pneumonia    02/16/2017 - 02/21/2017 Hospital Admission    Admission diagnosis: Abnormal LFT's Additional comments: Assoc diagnoses: Hep B flare, transminitis, dehydration, fever, SIRS    04/06/2017 -  Chemotherapy    Carfilzomib 56 mg/m2 on days 1 and 15 (except 207m2 on C1D1 and C1D2) with dexamethasone 20 mg on same days and pomalidomide 2 mg on days 1-21 of 28 day cycle        08/26/2017 - 08/28/2017 Hospital Admission    Admit date: 08/26/2017 Admission diagnosis: RLL Pneumonia  CURRENT THERAPY: carfilzomib 56 mg/m2 on days 1 and 15(except 49m/m2 on C1D1 and C1D2)with dexamethasone 20 mg on same days and pomalidomide 2 mg on days 1-21 of 28 day cycleon 04/13/17, dexa stopped on 07/06/2017 due to her CR   INTERVAL HISTORY: Ms.  HHammreturns for chemo as scheduled. She was seen in the infusion room with live interpreter. She feels well this week and last week. She has recovered from pneumonia, completed all antibiotics, and resumed chemotherapy on 9/4 with kyprolis and pomalyst. She will completed chemo pills next week. She reports several episodes of emesis after chemo on 9/4 lasting the whole evening after treatment and eventually resolved with compazine that night. She is out of zofran. She has generalized bone/joint aches on pomalyst. Otherwise she feels well, with good appetite, normal BM, no recurrent fever, chills, cough, chest pain, dyspnea, or neuropathy.    MEDICAL HISTORY:  Past Medical History:  Diagnosis Date  . Chills with fever    intermittently since d/c from hospital  . Dysuria-frequency syndrome    w/ pink urine  . GERD (gastroesophageal reflux disease)   . Hepatitis   . History of positive PPD    DX 2011--  CXR DONE NO EVIDENCE  . History of ureter stent   . Hydronephrosis, right   . Neuromuscular disorder ([facility])    legs numb intermittently  . 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[place]   . Pneumonia   . Right ureteral stone   . Urosepsis 8/14   ad[facility]    SURGICAL HISTORY: Past Surgical History:  Procedure Laterality Date  . CYSTOSCOPY W/ URETERAL STENT PLACEMENT Right 09/25/2012   Procedure: CYSTOSCOPY WITH RETROGRADE PYELOGRAM/URETERAL STENT PLACEMENT;  Surgeon: TAlexis Frock MD;  Location: [facility];  Service: Urology;  Laterality: Right;  . CYSTOSCOPY WITH RETROGRADE PYELOGRAM, URETEROSCOPY AND STENT PLACEMENT Right 10/15/2012   Procedure: CYSTOSCOPY WITH RETROGRADE PYELOGRAM, URETEROSCOPY AND REMOVAL STENT WITH  STENT PLACEMENT;  Surgeon: TAlexis Frock MD;  [facility]  Service: Urology;  Laterality: Right;  . ESOPHAGOGASTRODUODENOSCOPY (EGD) WITH PROPOFOL N/A 11/16/2014   Procedure: ESOPHAGOGASTRODUODENOSCOPY (EGD) WITH PROPOFOL;  Surgeon: DMilus Banister MD;   Location: [facility];  Service: Endoscopy;  Laterality: N/A;  . HOLMIUM LASER APPLICATION Right 95/78/4696  Procedure: HOLMIUM LASER APPLICATION;  Surgeon: TAlexis Frock MD;  [facility]  Service: Urology;  Laterality: Right;  . LIVER BIOPSY    . OTHER SURGICAL HISTORY Right    removal of ovarian cyst  . removal of uterine cyst     years ago  . RIGHT VATS W/ DRAINAGE PEURAL EFFUSION AND BX'S  10-30-2008    I have reviewed the social history and family history with the patient and they are unchanged from previous note.  ALLERGIES:  has No Known Allergies.  MEDICATIONS:  Current Outpatient Medications  Medication Sig Dispense Refill  . acyclovir (ZOVIRAX) 800 MG tablet Take 1 tablet (800 mg total) by mouth 2 (two) times daily. 60 tablet 5  . prochlorperazine (COMPAZINE) 10 MG tablet Take 1 tablet (10 mg total) by mouth every 6 (six) hours as needed for nausea or vomiting. 30 tablet 3  . VIREAD 300 MG tablet Take 1 tablet (300 mg total) by mouth daily. 30 tablet 11  . chlorpheniramine-HYDROcodone (TUSSIONEX) 10-8 MG/5ML SUER Take 5 mLs by mouth every 12 (twelve) hours as needed for cough. 140 mL 0  . feeding supplement, ENSURE ENLIVE, (ENSURE ENLIVE) LIQD Take 237 mLs by mouth 2 (  two) times daily between meals. 237 mL 12  . ibuprofen (ADVIL,MOTRIN) 200 MG tablet Take 200 mg by mouth every 6 (six) hours as needed for moderate pain.    . magic mouthwash SOLN Take 5 mLs by mouth 4 (four) times daily as needed for mouth pain. 240 mL 3  . omeprazole (PRILOSEC) 20 MG capsule Take 1 capsule (20 mg total) by mouth daily. 30 capsule 5  . ondansetron (ZOFRAN ODT) 8 MG disintegrating tablet Take 1 tablet (8 mg total) by mouth every 8 (eight) hours as needed for nausea or vomiting. 40 tablet 1  . polyethylene glycol (MIRALAX / GLYCOLAX) packet Take 17 g by mouth daily as needed (constipation). 14 each 1  . pomalidomide (POMALYST) 2 MG capsule Take 1 capsule (2 mg total) by  mouth daily. Take with water on days 1-21. Repeat every 28 days. 21 capsule 0  . potassium chloride SA (K-DUR,KLOR-CON) 20 MEQ tablet Take 1 tablet (20 mEq total) by mouth daily. 30 tablet 0  . promethazine (PHENERGAN) 25 MG tablet Take 1 tablet (25 mg total) by mouth every 6 (six) hours as needed for nausea or vomiting. 30 tablet 0   No current facility-administered medications for this visit.    Facility-Administered Medications Ordered in Other Visits  Medication Dose Route Frequency Provider Last Rate Last Dose  . sodium chloride 0.9 % injection 10 mL  10 mL Intravenous PRN Truitt Merle, MD   10 mL at 12/11/15 1532  . sodium chloride 0.9 % injection 10 mL  10 mL Intravenous PRN Truitt Merle, MD   10 mL at 06/11/16 1505  . sodium chloride 0.9 % injection 10 mL  10 mL Intravenous PRN Truitt Merle, MD   10 mL at 08/04/17 1643  . sodium chloride flush (NS) 0.9 % injection 10 mL  10 mL Intravenous PRN Truitt Merle, MD   10 mL at 10/09/15 1717  . sodium chloride flush (NS) 0.9 % injection 10 mL  10 mL Intracatheter PRN Truitt Merle, MD   10 mL at 10/22/17 1159    PHYSICAL EXAMINATION: ECOG PERFORMANCE STATUS: 1 - Symptomatic but completely ambulatory BP 115/75 P 76 RR 18 T 98.6  GENERAL:alert, no distress and comfortable SKIN: no rashes or significant lesions EYES: sclera clear OROPHARYNX:no thrush or ulcers   LYMPH:  no palpable cervical or supraclavicular lymphadenopathy LUNGS: clear to auscultation with normal breathing effort HEART: regular rate & rhythm, no lower extremity edema ABDOMEN:abdomen soft, non-tender and normal bowel sounds Musculoskeletal:no cyanosis of digits and no clubbing  NEURO: alert & oriented x 3 with fluent speech, no focal motor deficits PAC without erythema    LABORATORY DATA:  I have reviewed the data as listed CBC Latest Ref Rng & Units 10/22/2017 10/07/2017 09/29/2017  WBC 3.9 - 10.3 K/uL 3.4(L) 3.0(L) 2.8(L)  Hemoglobin 11.6 - 15.9 g/dL 10.5(L) 10.2(L) 10.6(L)    Hematocrit 34.8 - 46.6 % 32.8(L) 31.8(L) 34.2(L)  Platelets 145 - 400 K/uL 175 269 244     CMP Latest Ref Rng & Units 10/22/2017 10/07/2017 09/29/2017  Glucose 70 - 99 mg/dL 95 87 107(H)  BUN 6 - 20 mg/dL _0 Creatinine 0.44 - 1.00 mg/dL 0.73 0.66 0.75  Sodium 135 - 145 mmol/L 143 143 142  Potassium 3.5 - 5.1 mmol/L 3.4(L) 3.5 3.6  Chloride 98 - 111 mmol/L 109 110 109  CO2 22 - 32 mmol/L _1 Calcium 8.9 - 10.3 mg/dL 9.3 9.2 10.0  Total Protein  6.5 - 8.1 g/dL 7.0 7.2 7.8  Total Bilirubin 0.3 - 1.2 mg/dL 0.3 <0.2(L) 0.3  Alkaline Phos 38 - 126 U/L 75 65 71  AST 15 - 41 U/L _0 ALT 0 - 44 U/L <6 <6 <6   SPEP M-protein  09/15/2014: 4.2 10/08/14: 4.6 12/08/2014: 0.2 02/15/2015: not sufficient sample for test  09/11/2015: not observed 10/09/2015: not det  11/06/2015: not det  12/25/2015: not det  02/13/2016: 0.3 03/12/2016: 0.8 04/09/2016: 0.6 05/08/16: 0.1 06/04/16: 0.1 07/02/2016: 0.2 07/09/2016: 0.2 (dara specific IFE negative) 08/05/16: Not observed  09/01/16: Not observed 09/29/16: 0.1(dara specific IFE negative) 10/27/16: Not observed 11/24/16 : Not observed 12/23/16: 0.1 (Dara specific IFE showed IgG monoclonal protein with lambda light chain) 01/21/2017: 0.1 (Dara specific IFE showed IgG monoclonal protein with lambda light chain) 03/26/17: 0.2 04/27/17: 0.1 05/25/17: Not observed 06/23/17: Not observed 08/31/17: 0.5 09/07/17: not observed  09/22/17: not detected 09/29/17 not detected  10/07/17 not detected  9/19 PENDING   IgG mg/dl  11/03/2014: 3150  12/08/2014: 759 02/14/2014: 860 09/11/2015: 6629 10/09/2015: 1457 11/06/2015: 1504 12/25/2015: 1400 02/13/2016: 1543 03/12/2016: 1860 04/09/2016: 1620 05/08/16: 749 06/04/16: 751 5/30/20187: 796 07/09/2016: 701 08/05/16: 678 09/01/16: 650 09/29/16: 727 10/27/16: 634 11/24/16: 685  12/23/16: 714 01/21/2017: 709 03/26/17: 874 04/27/17: 1067 05/25/17: 1161 06/23/17: 1211 08/17/17: 1336 08/31/17: 1438 09/07/17 1474 09/22/17 1410   09/29/17 1761 10/07/17 1539 10/22/17 PENDING   Kappa/lambda light chains levels and ration  11/03/14: 0.75, 152, 0.00 12/22/2014: 1.10, 2.60, 0.42 02/15/2015: 9.59,14.35, 0.67 09/11/2015: 2.44, 2.72, 0.90 10/09/2015: 3.09, 3.49, 0.89 11/06/2015: 2.30, 2.62, 0.88 12/25/2015: 2.51, 3.0, 0.84 02/13/2016: 2.64, 15.3, 0.17 03/12/2016: 2.27, 45.5, 0.05 04/09/2016: 3.33, 45.5, 0.07 05/08/2016: 0.49, 1.21, 0.40 06/04/2016: 0.83, 1.11, 0.75 07/02/16: 0.51, 1.43, 0.36 07/23/16: 0.68, 1.02, 0.67 08/05/16: 0.62, 1.09, 0.57 09/01/16: 0.80, 1.21, 0.66 09/29/16: 0.44, 1.07, 0.41 10/27/16: 0.63, 1.44, 0.44 11/24/16: 0.83, 2.13, 0.39 12/23/16: 0.69, 2.31, 0.30 01/21/2017: 0.77, 3.08, 0.25   24 h urine UPEP/IFE and light chain: 11/03/2014: IFE showed a monoclonal IgG heavy chain with associated lambda light chain. M protein was Undetectable    PATHOLOGY   PATHOLOGY REPORT  Bone Marrow (BM) and Peripheral Blood (PB) FINAL PATHOLOGIC DIAGNOSIS  BONE MARROW: 07/11/2015 Hypocellular marrow (20%) with no increase in plasma cells (2%). See comment. PERIPHERAL BLOOD: Mild anemia. No circulating plasma cells identified. See comment and CBC data.  BONE MARROW: 02/22/2016 Diagnosis Bone Marrow, Aspirate,Biopsy, and Clot, right iliac - NORMOCELLULAR BONE MARROW FOR AGE WITH TRILINEAGE HEMATOPOIESIS. - PLASMACYTOSIS (PLASMA CELLS 12%). - SEE COMMENT. PERIPHERAL BLOOD: - OCCASIONAL CIRCULATING PLASMA CELLS.   PROCEDURES  Bone Marrow Biopsy, 01/21/17 Bone Marrow, Aspirate, Biopsy, and Clot - NORMOCELLULAR BONE MARROW FOR AGE WITH TRILINEAGE HEMATOPOIESIS AND 2% PLASMA CELLS. PERIPHERAL BLOOD: - NORMOCYTIC-HYPOCHROMIC ANEMIA.    RADIOGRAPHIC STUDIES: I have personally reviewed the radiological images as listed and agreed with the findings in the report. No results found.   ASSESSMENT & PLAN: 47 y.o.[place] woman, presented with anemia and leokocytosis   1. Acute productive cough,  pharyngitis, fatigue, vomiting, and possible fever, chest xray consistent with pneumonia - resolved 2. 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 transaminitis and hyperbilirubinemia, secondary to hep B flare 4. Type 2 DM, steroid induced 5. GERD, history of GI bleeding and nausea 6. Constipation  Ms. Sword appears stable. She has completely recovered from pnuemonia. She restarted chemotherapy and is tolerating moderately well, with nausea and vomiting and bone/joint aches. Her n/v  is controlled with compazine. I refilled zofran and changed to ODT for her to alternate with compazine for additional anti-emetic potential. I urged her to call [place] if this regimen is insufficient. She agrees. I recommend she stay adequately hydrated and eat small, frequent meals.   CBC and CMP reviewed, adequate to proceed with cycle 7 day 15 kyprolis today, she will continue pomalyst for 1 more week to complete 3 weeks on, then off for 1 week. I refilled today. Recent M spike remains undetected, still pending today. She will get flu vaccine today. She will return for lab, f/u, and kyprolis in 2 weeks.   PLAN: -Labs reviewed, proceed with cycle 7 day 15 kyprolis today -Continue pomalyst to complete 21 days on (will finish next week), then off 7 days -Return for f/u and kyprolis in 2 weeks  -OK to get flu vaccine today -Refill: zofran; reviewed n/v symptom management she will alternate zofran and compazine, will call if regimen is insufficient  -Increase K in your diet   All questions were answered. The patient knows to call the clinic with any problems, questions or concerns. No barriers to learning was detected. I spent 20 minutes counseling the patient face to face. The total time spent in the appointment was 25 minutes and more than 50% was on counseling and review of test results     Alla Feeling, NP 10/22/17

## 2017-10-22 ENCOUNTER — Inpatient Hospital Stay: Payer: Medicaid Other

## 2017-10-22 ENCOUNTER — Encounter: Payer: Self-pay | Admitting: Nurse Practitioner

## 2017-10-22 ENCOUNTER — Inpatient Hospital Stay (HOSPITAL_BASED_OUTPATIENT_CLINIC_OR_DEPARTMENT_OTHER): Payer: Medicaid Other | Admitting: Nurse Practitioner

## 2017-10-22 ENCOUNTER — Other Ambulatory Visit: Payer: Self-pay

## 2017-10-22 ENCOUNTER — Telehealth: Payer: Self-pay | Admitting: Nurse Practitioner

## 2017-10-22 VITALS — BP 115/75 | HR 76 | Temp 98.6°F | Resp 18

## 2017-10-22 DIAGNOSIS — R748 Abnormal levels of other serum enzymes: Secondary | ICD-10-CM

## 2017-10-22 DIAGNOSIS — K219 Gastro-esophageal reflux disease without esophagitis: Secondary | ICD-10-CM

## 2017-10-22 DIAGNOSIS — Z5112 Encounter for antineoplastic immunotherapy: Secondary | ICD-10-CM | POA: Diagnosis not present

## 2017-10-22 DIAGNOSIS — E099 Drug or chemical induced diabetes mellitus without complications: Secondary | ICD-10-CM

## 2017-10-22 DIAGNOSIS — Z23 Encounter for immunization: Secondary | ICD-10-CM

## 2017-10-22 DIAGNOSIS — R112 Nausea with vomiting, unspecified: Secondary | ICD-10-CM

## 2017-10-22 DIAGNOSIS — Z95828 Presence of other vascular implants and grafts: Secondary | ICD-10-CM

## 2017-10-22 DIAGNOSIS — K5900 Constipation, unspecified: Secondary | ICD-10-CM

## 2017-10-22 DIAGNOSIS — C9012 Plasma cell leukemia in relapse: Secondary | ICD-10-CM

## 2017-10-22 DIAGNOSIS — C9010 Plasma cell leukemia not having achieved remission: Secondary | ICD-10-CM

## 2017-10-22 DIAGNOSIS — M2550 Pain in unspecified joint: Secondary | ICD-10-CM

## 2017-10-22 DIAGNOSIS — C9011 Plasma cell leukemia in remission: Secondary | ICD-10-CM

## 2017-10-22 LAB — CBC WITH DIFFERENTIAL (CANCER CENTER ONLY)
Basophils Absolute: 0 10*3/uL (ref 0.0–0.1)
Basophils Relative: 1 %
EOS PCT: 6 %
Eosinophils Absolute: 0.2 10*3/uL (ref 0.0–0.5)
HCT: 32.8 % — ABNORMAL LOW (ref 34.8–46.6)
Hemoglobin: 10.5 g/dL — ABNORMAL LOW (ref 11.6–15.9)
LYMPHS ABS: 0.6 10*3/uL — AB (ref 0.9–3.3)
LYMPHS PCT: 19 %
MCH: 29.3 pg (ref 25.1–34.0)
MCHC: 32 g/dL (ref 31.5–36.0)
MCV: 91.7 fL (ref 79.5–101.0)
MONO ABS: 0.6 10*3/uL (ref 0.1–0.9)
Monocytes Relative: 17 %
Neutro Abs: 1.9 10*3/uL (ref 1.5–6.5)
Neutrophils Relative %: 57 %
PLATELETS: 175 10*3/uL (ref 145–400)
RBC: 3.58 MIL/uL — ABNORMAL LOW (ref 3.70–5.45)
RDW: 17.6 % — AB (ref 11.2–14.5)
WBC Count: 3.4 10*3/uL — ABNORMAL LOW (ref 3.9–10.3)

## 2017-10-22 LAB — CMP (CANCER CENTER ONLY)
ALT: <6 U/L (ref 0–44)
ANION GAP: 8 (ref 5–15)
AST: 21 U/L (ref 15–41)
Albumin: 3.3 g/dL — ABNORMAL LOW (ref 3.5–5.0)
Alkaline Phosphatase: 75 U/L (ref 38–126)
BUN: 13 mg/dL (ref 6–20)
CHLORIDE: 109 mmol/L (ref 98–111)
CO2: 26 mmol/L (ref 22–32)
Calcium: 9.3 mg/dL (ref 8.9–10.3)
Creatinine: 0.73 mg/dL (ref 0.44–1.00)
GFR, Estimated: >60 mL/min (ref >60)
Glucose, Bld: 95 mg/dL (ref 70–99)
POTASSIUM: 3.4 mmol/L — AB (ref 3.5–5.1)
Sodium: 143 mmol/L (ref 135–145)
Total Bilirubin: 0.3 mg/dL (ref 0.3–1.2)
Total Protein: 7 g/dL (ref 6.5–8.1)

## 2017-10-22 MED ORDER — DEXTROSE 5 % IV SOLN
56.0000 mg/m2 | Freq: Once | INTRAVENOUS | Status: AC
  Administered 2017-10-22: 90 mg via INTRAVENOUS
  Filled 2017-10-22: qty 15

## 2017-10-22 MED ORDER — SODIUM CHLORIDE 0.9 % IV SOLN
Freq: Once | INTRAVENOUS | Status: AC
  Administered 2017-10-22: 10:00:00 via INTRAVENOUS
  Filled 2017-10-22: qty 250

## 2017-10-22 MED ORDER — SODIUM CHLORIDE 0.9 % IJ SOLN
10.0000 mL | INTRAMUSCULAR | Status: DC | PRN
  Administered 2017-10-22: 10 mL via INTRAVENOUS
  Filled 2017-10-22: qty 10

## 2017-10-22 MED ORDER — INFLUENZA VAC SPLIT QUAD 0.5 ML IM SUSY
PREFILLED_SYRINGE | INTRAMUSCULAR | Status: AC
  Filled 2017-10-22: qty 0.5

## 2017-10-22 MED ORDER — INFLUENZA VAC SPLIT QUAD 0.5 ML IM SUSY
0.5000 mL | PREFILLED_SYRINGE | Freq: Once | INTRAMUSCULAR | Status: AC
  Administered 2017-10-22: 0.5 mL via INTRAMUSCULAR

## 2017-10-22 MED ORDER — SODIUM CHLORIDE 0.9% FLUSH
10.0000 mL | INTRAVENOUS | Status: DC | PRN
  Administered 2017-10-22: 10 mL
  Filled 2017-10-22: qty 10

## 2017-10-22 MED ORDER — HEPARIN SOD (PORK) LOCK FLUSH 100 UNIT/ML IV SOLN
500.0000 [IU] | Freq: Once | INTRAVENOUS | Status: AC | PRN
  Administered 2017-10-22: 500 [IU]
  Filled 2017-10-22: qty 5

## 2017-10-22 MED ORDER — PROCHLORPERAZINE MALEATE 10 MG PO TABS
10.0000 mg | ORAL_TABLET | Freq: Once | ORAL | Status: AC
  Administered 2017-10-22: 10 mg via ORAL

## 2017-10-22 MED ORDER — DEXAMETHASONE 4 MG PO TABS
40.0000 mg | ORAL_TABLET | Freq: Once | ORAL | Status: AC
  Administered 2017-10-22: 40 mg via ORAL

## 2017-10-22 MED ORDER — DEXAMETHASONE 4 MG PO TABS
ORAL_TABLET | ORAL | Status: AC
  Filled 2017-10-22: qty 10

## 2017-10-22 MED ORDER — POMALIDOMIDE 2 MG PO CAPS: 2.0000 mg | ORAL_CAPSULE | Freq: Every day | ORAL | 0 refills | Status: DC

## 2017-10-22 MED ORDER — ONDANSETRON 8 MG PO TBDP: 8.0000 mg | ORAL_TABLET | Freq: Three times a day (TID) | ORAL | 1 refills | Status: DC | PRN

## 2017-10-22 MED ORDER — SODIUM CHLORIDE 0.9 % IV SOLN
Freq: Once | INTRAVENOUS | Status: AC
  Administered 2017-10-22: 11:00:00 via INTRAVENOUS
  Filled 2017-10-22: qty 250

## 2017-10-22 MED ORDER — PROCHLORPERAZINE MALEATE 10 MG PO TABS
ORAL_TABLET | ORAL | Status: AC
  Filled 2017-10-22: qty 1

## 2017-10-22 NOTE — Patient Instructions (Signed)
[facility] Discharge Instructions for Patients Receiving Chemotherapy  Today you received the following chemotherapy agents:  Kyprolis  To help prevent nausea and vomiting after your treatment, we encourage you to take your nausea medication as directed.   If you develop nausea and vomiting that is not controlled by your nausea medication, call the clinic.   BELOW ARE SYMPTOMS THAT SHOULD BE REPORTED IMMEDIATELY:  *FEVER GREATER THAN 100.5 F  *CHILLS WITH OR WITHOUT FEVER  NAUSEA AND VOMITING THAT IS NOT CONTROLLED WITH YOUR NAUSEA MEDICATION  *UNUSUAL SHORTNESS OF BREATH  *UNUSUAL BRUISING OR BLEEDING  TENDERNESS IN MOUTH AND THROAT WITH OR WITHOUT PRESENCE OF ULCERS  *URINARY PROBLEMS  *BOWEL PROBLEMS  UNUSUAL RASH Items with * indicate a potential emergency and should be followed up as soon as possible.  Feel free to call the clinic should you have any questions or concerns. The clinic phone number is [phone].  Please show the CHEMO ALERT CARD at check-in to the Emergency Department and triage nurse.   

## 2017-10-22 NOTE — Telephone Encounter (Signed)
No 9/19 los °

## 2017-10-23 LAB — PROTEIN ELECTROPHORESIS, SERUM
A/G RATIO SPE: 1 (ref 0.7–1.7)
ALBUMIN ELP: 3.3 g/dL (ref 2.9–4.4)
ALPHA-2-GLOBULIN: 0.7 g/dL (ref 0.4–1.0)
Alpha-1-Globulin: 0.2 g/dL (ref 0.0–0.4)
BETA GLOBULIN: 0.9 g/dL (ref 0.7–1.3)
Gamma Globulin: 1.3 g/dL (ref 0.4–1.8)
Globulin, Total: 3.2 g/dL (ref 2.2–3.9)
Total Protein ELP: 6.5 g/dL (ref 6.0–8.5)

## 2017-10-26 ENCOUNTER — Telehealth: Payer: Self-pay | Admitting: Hematology

## 2017-10-26 NOTE — Telephone Encounter (Signed)
Appts scheduled LMV for patient with date time/ calendar/letter mailed per 9/19 sch msg

## 2017-10-27 LAB — IFE, DARA-SPECIFIC, SERUM
IgA: 222 mg/dL (ref 87–352)
IgG (Immunoglobin G), Serum: 1390 mg/dL (ref 700–1600)
IgM (Immunoglobulin M), Srm: 48 mg/dL (ref 26–217)

## 2017-10-28 ENCOUNTER — Ambulatory Visit: Payer: Medicaid Other | Admitting: Hematology

## 2017-10-28 ENCOUNTER — Other Ambulatory Visit: Payer: Medicaid Other

## 2017-10-28 ENCOUNTER — Ambulatory Visit: Payer: Medicaid Other

## 2017-11-02 NOTE — Progress Notes (Signed)
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    10/10/2014 Miscellaneous    Peripheral blood chemistry and leukocytosis with total white count 78K, comprised of large plasma cells and his normocytic anemia. There is a myeloid left shift with previous surgical radium blasts. Flow cytometry showed 64% plasma cells    10/10/2014 Bone Marrow Biopsy    Markedly hypercellular marrow (95%), Atypical plasma cells comprise 57% of the cellularity. There was diminished multilineage in hematopoiesis with adequate maturation. Breasts less than 1%), no overt dysplasia of the myeloid or erythroid lineages.     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    05/07/2015 - 05/12/2015 Hospital Admission    [facility], [facility] ID workup was negative, EGD showed evidence of gastritis and duodenitis, no H. pylori or  CMV.    05/20/2015 - 05/24/2015 Hospital Admission    [facility]    07/11/2015 Bone Marrow Biopsy    Post transplant 100 a bone marrow biopsy showed hypocellular marrow, 20%, no increase in plasma cells (2%) or other abnormalities.    08/22/2015 - 01/02/2016 Chemotherapy    MaintenanceCyborD (cytoxan 3058mm2 iv, bortezomib 1.5 mg/m, dexamethasone 40 mg, every 2 weeks, changed to Velcade maintenance after 4 months treatment    01/16/2016 - 04/19/2016 Chemotherapy    Maintenance Velcade 1.3 mg/m every 2 weeks    02/22/2016 Pathology Results    BONE MARROW: Diagnosis Bone Marrow, Aspirate,Biopsy, and Clot, right iliac - NORMOCELLULAR BONE MARROW FOR AGE WITH TRILINEAGE HEMATOPOIESIS. - PLASMACYTOSIS (PLASMA CELLS 12%). - SEE COMMENT. PERIPHERAL BLOOD: - OCCASIONAL CIRCULATING PLASMA CELLS.    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     04/18/2016 - 03/26/2017 Chemotherapy    Daratumumab per protocol  CyBorD every week, cytoxan was held after 04/24/2016 dye to cytopenia and infection  -discontinued due to Hep B flare    05/11/2016 - 05/16/2016 Hospital Admission    Healthcare-associated pneumonia    02/16/2017 - 02/21/2017 Hospital Admission    Admission diagnosis: Abnormal LFT's Additional comments: Assoc diagnoses: Hep B flare, transminitis, dehydration, fever, SIRS    04/06/2017 -  Chemotherapy    Carfilzomib 56 mg/m2 on days 1 and 15 (except 2061m2 on C1D1 and C1D2) with dexamethasone 20 mg on same days and pomalidomide 2 mg on days 1-21 of 28 day cycle  08/26/2017 - 08/28/2017 Hospital Admission    Admit date: 08/26/2017 Admission diagnosis: RLL Pneumonia      HISTORY OF PRESENTING ILLNESS:  Sheryl [name] 47 y.o. female is here because of recently diagnosed plasma cell leukemia. She was [facility] days ago and is here to  establish her local oncological care with [place]. She is a [place], does not speak [place], she is accompanied to the clinic by her daughter and interpreter.  She presented to our hospital with worsening dyspnea, fatigue, cough, subjective fevers and chills, and was admitted on 09/14/2014. She was found to have allergy WBC 54.1K, hemoglobin 8.1, plt 310, she was treated with symptom management, and was discharged home on 09/17/2014, with a plan to follow-up with hematology. She represents to emergency room on 10/07/2014, was found to have white count of 78K, and worsening anemia with hemoglobin 6. She was seen by my partner Dr. Jonette Eva and plasma cell leukemia was suspected. She was transferred to [facility] for further leukemia work-up and treatment. Bone marrow biopsy was done, which confirmed plasma cell leukemia, and she started chemotherapy with Velcade, Cytoxan and dexamethasone. She received weekly dose twice, last dose on 10/20/2014. She was discharged to home afterwards. She had a mediport placed during her hospitalization.  She has moderate fatigue, low appetite, she lost about 13 lbs in the past few months. She has moderate abdominal pain, 5-6/10, persistent, she does not take any pain meds.   I reviewed her medical records extensively, and discussed her case with Dr. Jorje Guild and [place] program coordinator.  CURRENT THERAPY:   1. carfilzomib 56 mg/m2 on days 1 and 15(except 64m/m2 on C1D1 and C1D2)with dexamethasone 20 mg on same days and pomalidomide 2 mg on days 1-21 of 28 day cycleon 04/13/17, dexa stopped on 07/06/2017 due to her CR   2.  Zometa every 4 weeks, started on 11/26/2017, plan for 2 years   INTERIM HISTORY:   Sheryl [name] is here for follow up of her acute plasma cell Leukemia. She has seen NP Lacie multiple times in the interim. Her treatment on 09/22/2017 was held due to URTI. She was last seen by NP Lacie on 10/22/2017 when she was noted to be doing well. Today, she is  here with her interpreter. She is doing well, but had problems with her insurance and medication refills. She ran out of her medications and was informed that medicaid will not pay for it. She is recovering well from her URTI. She denies CP or SOB. She is eating well and states that her energy is good. She is back to work 3 days a week and is doing well. She sleeps well.  She complains of generalized body pain that is chronic and started after chemo started. She also experiences nausea and vomiting after chemo.     MEDICAL HISTORY:  Past Medical History:  Diagnosis Date  . Chills with fever    intermittently since d/c from hospital  . Dysuria-frequency syndrome    w/ pink urine  . GERD (gastroesophageal reflux disease)   . Hepatitis   . History of positive PPD    DX 2011--  CXR DONE NO EVIDENCE  . History of ureter stent   . Hydronephrosis, right   . Neuromuscular disorder ([facility])    legs numb intermittently  . 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   . Pneumonia   . Right ureteral stone   . [facility]rosepsis 8/14   [facility]  SURGICAL HISTORY: Past Surgical History:  Procedure Laterality Date  . CYSTOSCOPY W/ URETERAL STENT PLACEMENT Right 09/25/2012   Procedure: CYSTOSCOPY WITH RETROGRADE PYELOGRAM/URETERAL STENT PLACEMENT;  Surgeon: Alexis Frock, MD;  Location: [facility];  Service: Urology;  Laterality: Right;  . CYSTOSCOPY WITH RETROGRADE PYELOGRAM, URETEROSCOPY AND STENT PLACEMENT Right 10/15/2012   Procedure: CYSTOSCOPY WITH RETROGRADE PYELOGRAM, URETEROSCOPY AND REMOVAL STENT WITH  STENT PLACEMENT;  Surgeon: Alexis Frock, MD;  Location: [facility];  Service: Urology;  Laterality: Right;  . ESOPHAGOGASTRODUODENOSCOPY (EGD) WITH PROPOFOL N/A 11/16/2014   Procedure: ESOPHAGOGASTRODUODENOSCOPY (EGD) WITH PROPOFOL;  Surgeon: Milus Banister, MD;  Location: [facility];  Service: Endoscopy;  Laterality: N/A;  . HOLMIUM LASER APPLICATION Right 07/29/6387   Procedure: HOLMIUM  LASER APPLICATION;  Surgeon: Alexis Frock, MD;  Location: [facility];  Service: Urology;  Laterality: Right;  . LIVER BIOPSY    . OTHER SURGICAL HISTORY Right    removal of ovarian cyst  . removal of uterine cyst     years ago  . RIGHT VATS W/ DRAINAGE PEURAL EFFUSION AND BX'S  10-30-2008    SOCIAL HISTORY: Social History   Socioeconomic History  . Marital status: Single    Spouse name: Not on file  . Number of children: 3  . Years of education: Not on file  . Highest education level: Not on file  Occupational History  . Not on file  Social Needs  . Financial resource strain: Not on file  . Food insecurity:    Worry: Not on file    Inability: Not on file  . Transportation needs:    Medical: Not on file    Non-medical: Not on file  Tobacco Use  . Smoking status: Never Smoker  . Smokeless tobacco: Never Used  Substance and Sexual Activity  . Alcohol use: No    Alcohol/week: 0.0 standard drinks  . Drug use: No  . Sexual activity: Not Currently    Birth control/protection: Abstinence  Lifestyle  . Physical activity:    Days per week: Not on file    Minutes per session: Not on file  . Stress: Not on file  Relationships  . Social connections:    Talks on phone: Not on file    Gets together: Not on file    Attends religious service: Not on file    Active member of club or organization: Not on file    Attends meetings of clubs or organizations: Not on file    Relationship status: Not on file  Other Topics Concern  . Not on file  Social History Narrative  . Not on file    FAMILY HISTORY: Family History  Problem Relation Age of Onset  . Stomach cancer Mother   . Lung disease Father   . Asthma Father     ALLERGIES:  has No Known Allergies.  MEDICATIONS:  Current Outpatient Medications  Medication Sig Dispense Refill  . acyclovir (ZOVIRAX) 800 MG tablet Take 1 tablet (800 mg total) by mouth 2 (two) times daily. 60 tablet 5  .  chlorpheniramine-HYDROcodone (TUSSIONEX) 10-8 MG/5ML SUER Take 5 mLs by mouth every 12 (twelve) hours as needed for cough. 140 mL 0  . feeding supplement, ENSURE ENLIVE, (ENSURE ENLIVE) LIQD Take 237 mLs by mouth 2 (two) times daily between meals. 237 mL 12  . ibuprofen (ADVIL,MOTRIN) 200 MG tablet Take 200 mg by mouth every 6 (six) hours as needed for moderate pain.    . magic mouthwash SOLN  Take 5 mLs by mouth 4 (four) times daily as needed for mouth pain. 240 mL 3  . omeprazole (PRILOSEC) 20 MG capsule Take 1 capsule (20 mg total) by mouth daily. 30 capsule 5  . ondansetron (ZOFRAN ODT) 8 MG disintegrating tablet Take 1 tablet (8 mg total) by mouth every 8 (eight) hours as needed for nausea or vomiting. 40 tablet 1  . polyethylene glycol (MIRALAX / GLYCOLAX) packet Take 17 g by mouth daily as needed (constipation). 14 each 1  . potassium chloride SA (K-DUR,KLOR-CON) 20 MEQ tablet Take 1 tablet (20 mEq total) by mouth daily. 30 tablet 0  . prochlorperazine (COMPAZINE) 10 MG tablet Take 1 tablet (10 mg total) by mouth every 6 (six) hours as needed for nausea or vomiting. 30 tablet 3  . promethazine (PHENERGAN) 25 MG tablet Take 1 tablet (25 mg total) by mouth every 6 (six) hours as needed for nausea or vomiting. 30 tablet 0  . VIREAD 300 MG tablet Take 1 tablet (300 mg total) by mouth daily. 30 tablet 11  . pomalidomide (POMALYST) 2 MG capsule Take 1 capsule (2 mg total) by mouth daily. Take with water on days 1-21. Repeat every 28 days. 21 capsule 0   No current facility-administered medications for this visit.    Facility-Administered Medications Ordered in Other Visits  Medication Dose Route Frequency Provider Last Rate Last Dose  . sodium chloride 0.9 % injection 10 mL  10 mL Intravenous PRN Truitt Merle, MD   10 mL at 12/11/15 1532  . sodium chloride 0.9 % injection 10 mL  10 mL Intravenous PRN Truitt Merle, MD   10 mL at 06/11/16 1505  . sodium chloride 0.9 % injection 10 mL  10 mL Intravenous  PRN Truitt Merle, MD   10 mL at 08/04/17 1643  . sodium chloride flush (NS) 0.9 % injection 10 mL  10 mL Intravenous PRN Truitt Merle, MD   10 mL at 10/09/15 1717    REVIEW OF SYSTEMS:   Constitutional: no abnormal night sweats (+) good appetite, energy improved  Eyes: Denies blurriness of vision, double vision or watery eyes.  Ears, nose, mouth, throat, and face: Denies mucositis  Respiratory: Denies, dyspnea or wheezes  Cardiovascular: Denies palpitation, chest discomfort or lower extremity swelling Gastrointestinal:  Denies nausea, heartburn, reports regular bowel/bladder (+) nausea and vomiting after chemo GU: WNLs Skin: Denies abnormal skin rashes Musculoskeletal: (+) generalized body aches Lymphatics: Denies new lymphadenopathy or easy bruising Neurological:Denies numbness, tingling or new weaknesses Behavioral/Psych: Mood is stable, no new changes  All other systems were reviewed with the patient and are negative.  PHYSICAL EXAMINATION:   ECOG PERFORMANCE STATUS: 1-2  Vitals:   11/04/17 1036  BP: 127/76  Pulse: 74  Resp: 18  Temp: 97.8 F (36.6 C)  TempSrc: Oral  SpO2: 100%  Weight: 130 lb 14.4 oz (59.4 kg)  Height: '5\' 2"'  (1.575 m)    GENERAL: She was wrapped with multiple blankets, not in respiratory distress. SKIN: skin color, texture, turgor are normal, no rashes or significant lesions EYES: normal, conjunctiva are pink and non-injected OROPHARYNX:no exudate, no erythema and lips, buccal mucosa NECK: supple, thyroid normal size, non-tender, without nodularity LYMPH:  no palpable lymphadenopathy in the cervical, axillary or inguinal LUNGS: (+) bilateral crackles on the base of her lungs HEART: regular rate & rhythm and no murmurs and no lower extremity edema ABDOMEN:abdomen soft, moderate tenderness in the right upper quadrant of abdomen, no rebound pain, no Murphy sign, no  hepatomegaly. Musculoskeletal:no cyanosis of digits and no clubbing  PSYCH: alert & oriented x 3  with fluent speech NEURO: no focal motor/sensory deficits  LABORATORY DATA:  I have reviewed the data as listed CBC Latest Ref Rng & Units 11/04/2017 10/22/2017 10/07/2017  WBC 3.9 - 10.3 K/uL 3.4(L) 3.4(L) 3.0(L)  Hemoglobin 11.6 - 15.9 g/dL 10.8(L) 10.5(L) 10.2(L)  Hematocrit 34.8 - 46.6 % 33.0(L) 32.8(L) 31.8(L)  Platelets 145 - 400 K/uL 255 175 269    CMP Latest Ref Rng & Units 11/04/2017 10/22/2017 10/07/2017  Glucose 70 - 99 mg/dL 74 95 87  BUN 6 - 20 mg/dL '17 13 15  ' Creatinine 0.44 - 1.00 mg/dL 0.70 0.73 0.66  Sodium 135 - 145 mmol/L 143 143 143  Potassium 3.5 - 5.1 mmol/L 3.7 3.4(L) 3.5  Chloride 98 - 111 mmol/L 108 109 110  CO2 22 - 32 mmol/L '27 26 27  ' Calcium 8.9 - 10.3 mg/dL 9.2 9.3 9.2  Total Protein 6.5 - 8.1 g/dL 7.0 7.0 7.2  Total Bilirubin 0.3 - 1.2 mg/dL 0.2(L) 0.3 <0.2(L)  Alkaline Phos 38 - 126 U/L 83 75 65  AST 15 - 41 U/L '23 21 21  ' ALT 0 - 44 U/L 7 <6 <6    SPEP M-protein  09/15/2014: 4.2 10/08/14: 4.6 12/08/2014: 0.2 02/15/2015: not sufficient sample for test   09/11/2015: not observed 10/09/2015: not det   11/06/2015: not det  12/25/2015: not det  02/13/2016: 0.3 03/12/2016: 0.8 04/09/2016: 0.6 05/08/16: 0.1 06/04/16: 0.1 07/02/2016: 0.2 07/09/2016: 0.2 (dara specific IFE negative) 08/05/16: Not observed  09/01/16: Not observed 09/29/16: 0.1(dara specific IFE negative) 10/27/16: Not observed 11/24/16 : Not observed 12/23/16: 0.1 (Dara specific IFE showed IgG monoclonal protein with lambda light chain) 01/21/2017: 0.1 (Dara specific IFE showed IgG monoclonal protein with lambda light chain) 03/26/17: 0.2 04/27/17: 0.1 05/25/17: Not observed 06/23/17: Not observed 08/31/17: 0.5 09/29/2017; not observed  10/22/17: Not Observed (Dara specific IFE negative)  IgG mg/dl  11/03/2014: 3150  12/08/2014:  759 02/14/2014: 860 09/11/2015: 1347 10/09/2015: 1457 11/06/2015: 1504 12/25/2015: 1400 02/13/2016: 1543 03/12/2016: 1860 04/09/2016: 1620 05/08/16: 749 06/04/16: 751 5/30/20187:  796 07/09/2016: 701 08/05/16: 678 09/01/16: 650 09/29/16: 727 10/27/16: 634 11/24/16: 685  12/23/16: 714 01/21/2017: 709 03/26/17: 874 04/27/17: 1067 05/25/17: 1161 06/23/17: 1211 08/17/17: 1336 08/31/17: 1438 10/22/17: 1,390  Kappa/lambda light chains levels and ration  11/03/14: 0.75, 152, 0.00 12/22/2014: 1.10, 2.60, 0.42 02/15/2015: 9.59,14.35, 0.67 09/11/2015: 2.44, 2.72, 0.90 10/09/2015: 3.09, 3.49, 0.89 11/06/2015: 2.30, 2.62, 0.88 12/25/2015: 2.51, 3.0, 0.84 02/13/2016: 2.64, 15.3, 0.17 03/12/2016: 2.27, 45.5, 0.05 04/09/2016: 3.33, 45.5, 0.07 05/08/2016: 0.49, 1.21, 0.40 06/04/2016: 0.83, 1.11, 0.75 07/02/16: 0.51, 1.43, 0.36 07/23/16: 0.68, 1.02, 0.67 08/05/16: 0.62, 1.09, 0.57 09/01/16: 0.80, 1.21, 0.66 09/29/16: 0.44, 1.07, 0.41 10/27/16: 0.63, 1.44, 0.44 11/24/16: 0.83, 2.13, 0.39 12/23/16: 0.69, 2.31, 0.30 01/21/2017: 0.77, 3.08, 0.25   24 h urine UPEP/IFE and light chain: 11/03/2014: IFE showed a monoclonal IgG heavy chain with associated lambda light chain. M protein was Undetectable    PATHOLOGY   01/21/2018 Cytogenetics  Normal  PATHOLOGY REPORT  Bone Marrow (BM) and Peripheral Blood (PB) FINAL PATHOLOGIC DIAGNOSIS  BONE MARROW: 07/11/2015 Hypocellular marrow (20%) with no increase in plasma cells (2%). See comment. PERIPHERAL BLOOD: Mild anemia. No circulating plasma cells identified. See comment and CBC data.  BONE MARROW: 02/22/2016 Diagnosis Bone Marrow, Aspirate,Biopsy, and Clot, right iliac - NORMOCELLULAR BONE MARROW FOR AGE WITH TRILINEAGE HEMATOPOIESIS. - PLASMACYTOSIS (PLASMA CELLS 12%). - SEE COMMENT. PERIPHERAL BLOOD: -  OCCASIONAL CIRCULATING PLASMA CELLS.   PROCEDURES  Bone Marrow Biopsy, 01/21/17 Bone Marrow, Aspirate, Biopsy, and Clot - NORMOCELLULAR BONE MARROW FOR AGE WITH TRILINEAGE HEMATOPOIESIS AND 2% PLASMA CELLS. PERIPHERAL BLOOD: - NORMOCYTIC-HYPOCHROMIC ANEMIA.   Colonoscopy by Dr. Ardis Hughs on 11/26/16  IMPRESSION - One 2 mm polyp in  the transverse colon, removed with a cold biopsy forceps. Resected and retrieved. - Small internal and external hemorrhoids. - The examination was otherwise normal on direct and retroflexion views.   RADIOGRAPHIC STUDIES: I have personally reviewed the radiological images as listed and agreed with the findings in the report. No results found.   CXR 05/08/2016 IMPRESSION:  No acute disease.  CT Biopsy 02/22/16 IMPRESSION: 1. Technically successful CT guided right iliac bone core and aspiration biopsy.  ASSESSMENT & PLAN:  47 y.o. [place] woman, presented with anemia and leokocytosis    1.  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 Unfortunately this showed increased plasma cells 12% with additional lambda light chain staining pending. This is relapse of disease. Cytogenetics was normal  -Her M protein and lambda free light chain level has significantly increased in 02/2016, consistent with disease relapse. Her LP showed negative CSF, no evidence of CNS involvement. -I have previously spoken with Dr. Norma Fredrickson, we agreed to change her CyBorD to weekly, and add Daratumumab weekly, starting 04/18/16.   -Continue Zometa every 4 weeks for a total of 2 years (until 11/2017) -Due to her hospitalization for severe cytopenia and infection, Cytoxan has been held. -She restarted weekly Velcade and dexa, and on Dara every 4 weeks, tolerating well, will continue. Her dara-specific IFE was negative for M-protein on 07/07/16 at [facility], she has achieved second CR in 07/2016.  -Her SPEP and IFE detected a small amount M-protein again, the Dara specific immunofixation was positive, this is concerning for recurrence.   We will continue monitor her SPEP and immunofixation every months -Bone Marrow Biopsy on 01/21/2017 showed 2% of plasma cells, monoclonal, no definitive evidence of plasma neoplasm.  It confirmed complete response. --Due to her severe hep B flare, her treatment has been held since 02/02/2017. Dr. Norma Fredrickson was informed. Her hepatitis B flare is likely related to daratumumab. -She was seen by Dr. Norma Fredrickson, she does not have a matched bone marrow, her transplant has been held temporarily.  Dr. Norma Fredrickson recommends switching her treatment to carfilzomib 56 mg/m2 on days 1 and 15 with dexamethasone 20 mg on same days and pomalidomide 2 mg on days 1-21 of 28 day cycle. She has started on 04/13/2017  -Due to complete remission, I recommended to stop dexamethasone, and continue Pomalyst and carfilzomib as maintenance therapy indefinitely. -Her M protein from 08/31/17 was 0.5.  This is concerning for disease recurrence. It became non-detectable again on subsequent labs. She remains to be in remission  - Labs reviewed and dicussed with patient. CBC showed Hg 10.8 WBC 3.4K  Platelets 255K. CMP is over all WNLs. SPEP pending.  -She has been tolerating treatment moderate well, with moderate nausea several days after infusion, occasional vomiting and generalized body aches.  She still able to tolerate routine activities, works 2 to 3 days a week. -She follows up with her PCP and her vaccines are up to date. -she could  not refill her meds due to expiration of her Medcaid. She met with our financial advisor [phone] and applied for the cancer center grant. -Follow-up in 2 weeks for next treatment with lacie -f/u with  Me in 4 weeks  2. Transaminitis and hyperbilirubinemia, secondary to hepatitis B flare -She has developed significant transaminitis and hyperbilirubinemia since January 2019 -She was previously on hepatitis B treatment entecavir by her GI at [facility], however she was off treatment for 2-3 months in late  2018, due to her issues with refill.  She did refill and restarted at the end of December 2018, her repeated hepatitis B titer was very high in Jan 2019. -it's resolved now, her total bilirubin, liver enzymes are back to normal now. She will continue to be closely followed by ID Dr. Baxter Flattery. She was last seen by Dr. Baxter Flattery in 04/2017, I encouraged her to follow-up.  3. Type 2 diabetes, steroids induced  -She was noticed to have increased blood glucose level lately, with random blood glucose above 200. She also developed diabetic symptoms. No history of diabetes in the past. -This is likely steroids induced hyperglycemia -she was on metformin when she was on dexa -Her BG is better controlled with metformin.  Her last hemoglobin A1c was 5% on May 25, 2017 -She has been off dexamethasone, not on metformin now.  Her random sugar has been normal.  4. GERD, history of GI bleeding and nausea  -Continue omeprazole daily. We previously discussed steroids can worsen her acid reflux. -She previously had an EGD at [facility] in 2017. This was benign.  -she did not do her colonoscopy because she did not have anyone to accompany her during visit. She has recently seen Dr. Ardis Hughs again, and is scheduled for colonoscopy. -Colonoscopy on 11/26/16 per Dr. Ardis Hughs notable for a polyp in the transverse colon, revealed to be tubular adenoma and negative for high grade dysplasia or malignancy.   5. Constipation - Her last BM was this morning - She uses Miralax twice daily. She also uses Colace - I recommend her to try Senna - I will monitor  6. Body pain -She has been having mild generalized body, especially in her thighs, since she started chemo.  It seems to be worse when she switched to current regiment.  -Overall manageable, she is not on pain medication.  7. Insurance issue -She has Medicaid, but expired lately. -She met our financial advisor [place] today, and received $500 grant for her medication, she  will transfer her prescriptions to [facility], and get them refilled with the [facility] -She will contact social services to renew her Medicaid  Plan  -Lab reviewed, adequate for treatment, she will continue Kyprolis every 2 weeks, pomlyst day 1-21 every 28 days, and zometa every 4 weeks (which will complete in late Oct 2019)  -Follow-up in 2 weeks with lacie -f/u with me in 4 weeks   All questions were answered. The patient knows to call the clinic with any problems, questions or concerns.  I spent 25 minutes counseling the patient face to face.  The total time spent in the appointment was 30 minutes and more than 50% was on counseling.   Dierdre Searles Dweik am acting as scribe for Dr. Truitt Merle.  I have reviewed the above documentation for accuracy and completeness, and I agree with the above.    Truitt Merle, MD  11/04/2017

## 2017-11-04 ENCOUNTER — Encounter: Payer: Self-pay | Admitting: Hematology

## 2017-11-04 ENCOUNTER — Other Ambulatory Visit: Payer: Self-pay

## 2017-11-04 ENCOUNTER — Telehealth: Payer: Self-pay

## 2017-11-04 ENCOUNTER — Inpatient Hospital Stay: Payer: Medicaid Other | Attending: Hematology

## 2017-11-04 ENCOUNTER — Inpatient Hospital Stay: Payer: Medicaid Other

## 2017-11-04 ENCOUNTER — Inpatient Hospital Stay (HOSPITAL_BASED_OUTPATIENT_CLINIC_OR_DEPARTMENT_OTHER): Payer: Medicaid Other | Admitting: Hematology

## 2017-11-04 VITALS — BP 127/76 | HR 74 | Temp 97.8°F | Resp 18 | Ht 62.0 in | Wt 130.9 lb

## 2017-11-04 DIAGNOSIS — B1910 Unspecified viral hepatitis B without hepatic coma: Secondary | ICD-10-CM | POA: Diagnosis not present

## 2017-11-04 DIAGNOSIS — C9012 Plasma cell leukemia in relapse: Secondary | ICD-10-CM | POA: Insufficient documentation

## 2017-11-04 DIAGNOSIS — K219 Gastro-esophageal reflux disease without esophagitis: Secondary | ICD-10-CM

## 2017-11-04 DIAGNOSIS — Z5112 Encounter for antineoplastic immunotherapy: Secondary | ICD-10-CM | POA: Diagnosis present

## 2017-11-04 DIAGNOSIS — K5900 Constipation, unspecified: Secondary | ICD-10-CM | POA: Insufficient documentation

## 2017-11-04 DIAGNOSIS — T380X5A Adverse effect of glucocorticoids and synthetic analogues, initial encounter: Secondary | ICD-10-CM | POA: Diagnosis not present

## 2017-11-04 DIAGNOSIS — E099 Drug or chemical induced diabetes mellitus without complications: Secondary | ICD-10-CM

## 2017-11-04 DIAGNOSIS — C9010 Plasma cell leukemia not having achieved remission: Secondary | ICD-10-CM

## 2017-11-04 DIAGNOSIS — D649 Anemia, unspecified: Secondary | ICD-10-CM

## 2017-11-04 DIAGNOSIS — B181 Chronic viral hepatitis B without delta-agent: Secondary | ICD-10-CM

## 2017-11-04 DIAGNOSIS — Z95828 Presence of other vascular implants and grafts: Secondary | ICD-10-CM

## 2017-11-04 LAB — CMP (CANCER CENTER ONLY)
ALT: 7 U/L (ref 0–44)
ANION GAP: 8 (ref 5–15)
AST: 23 U/L (ref 15–41)
Albumin: 3.4 g/dL — ABNORMAL LOW (ref 3.5–5.0)
Alkaline Phosphatase: 83 U/L (ref 38–126)
BILIRUBIN TOTAL: 0.2 mg/dL — AB (ref 0.3–1.2)
BUN: 17 mg/dL (ref 6–20)
CO2: 27 mmol/L (ref 22–32)
Calcium: 9.2 mg/dL (ref 8.9–10.3)
Chloride: 108 mmol/L (ref 98–111)
Creatinine: 0.7 mg/dL (ref 0.44–1.00)
GFR, Estimated: >60 mL/min (ref >60)
GLUCOSE: 74 mg/dL (ref 70–99)
POTASSIUM: 3.7 mmol/L (ref 3.5–5.1)
Sodium: 143 mmol/L (ref 135–145)
TOTAL PROTEIN: 7 g/dL (ref 6.5–8.1)

## 2017-11-04 LAB — CBC WITH DIFFERENTIAL (CANCER CENTER ONLY)
Basophils Absolute: 0 10*3/uL (ref 0.0–0.1)
Basophils Relative: 1 %
EOS ABS: 0.1 10*3/uL (ref 0.0–0.5)
Eosinophils Relative: 2 %
HEMATOCRIT: 33 % — AB (ref 34.8–46.6)
Hemoglobin: 10.8 g/dL — ABNORMAL LOW (ref 11.6–15.9)
Lymphocytes Relative: 28 %
Lymphs Abs: 1 10*3/uL (ref 0.9–3.3)
MCH: 30.3 pg (ref 25.1–34.0)
MCHC: 32.7 g/dL (ref 31.5–36.0)
MCV: 92.7 fL (ref 79.5–101.0)
MONO ABS: 0.4 10*3/uL (ref 0.1–0.9)
MONOS PCT: 11 %
NEUTROS ABS: 2 10*3/uL (ref 1.5–6.5)
Neutrophils Relative %: 58 %
Platelet Count: 255 10*3/uL (ref 145–400)
RBC: 3.56 MIL/uL — ABNORMAL LOW (ref 3.70–5.45)
RDW: 16.6 % — AB (ref 11.2–14.5)
WBC Count: 3.4 10*3/uL — ABNORMAL LOW (ref 3.9–10.3)

## 2017-11-04 LAB — PREGNANCY, URINE: Preg Test, Ur: NEGATIVE

## 2017-11-04 MED ORDER — HEPARIN SOD (PORK) LOCK FLUSH 100 UNIT/ML IV SOLN
500.0000 [IU] | Freq: Once | INTRAVENOUS | Status: AC | PRN
  Administered 2017-11-04: 500 [IU] via INTRAVENOUS
  Filled 2017-11-04: qty 5

## 2017-11-04 MED ORDER — POMALIDOMIDE 2 MG PO CAPS: 2.0000 mg | ORAL_CAPSULE | Freq: Every day | ORAL | 0 refills | Status: DC

## 2017-11-04 MED ORDER — SODIUM CHLORIDE 0.9% FLUSH
10.0000 mL | INTRAVENOUS | Status: DC | PRN
  Administered 2017-11-04: 10 mL
  Filled 2017-11-04: qty 10

## 2017-11-04 MED FILL — PROCHLORPERAZINE 10 MG TAB: 10 | 7 days supply | Qty: 30 | Fill #0

## 2017-11-04 MED FILL — ONDANSETRON ODT 8 MG TABLET: 8 | 13 days supply | Qty: 40 | Fill #0

## 2017-11-04 MED FILL — OMEPRAZOLE 20 MG CPDR: 20 | 30 days supply | Qty: 30 | Fill #0

## 2017-11-04 MED FILL — ACYCLOVIR 800 MG TABLET: 800 | 30 days supply | Qty: 60 | Fill #0

## 2017-11-04 NOTE — Progress Notes (Signed)
Met with patient and interpreter regarding assistance with oral medications.  Patient was able to have her daughter bring her proof of income for grant. Patient approved for one-time $500 [place]. She was given a copy of the approval letter as well as expense sheet along with outpatient pharmacy information. Called [facility] pharmacy([facility]) to have medications transferred from [facility] to them so that grant may be utilized. Advised patient's daughter to go to [place] to obtain medication and utilized grant. Referred her to the [place] to follow up on Medicaid. She verbalized understanding and has my card for any additional financial questions or concerns.

## 2017-11-04 NOTE — Telephone Encounter (Signed)
Faxed script for [employer] with [employer] approval to [employer].

## 2017-11-04 NOTE — Progress Notes (Signed)
Also called Rob to advise of uninsured status for chemo drug replacement.  He came down and met with patient and had her sign application for Kyprolis. He also provided his card for any additional questions or concerns.

## 2017-11-04 NOTE — Telephone Encounter (Signed)
Faxed script for [employer] to [employer].

## 2017-11-05 ENCOUNTER — Other Ambulatory Visit: Payer: Self-pay

## 2017-11-05 ENCOUNTER — Encounter: Payer: Self-pay | Admitting: Pharmacy Technician

## 2017-11-05 ENCOUNTER — Telehealth: Payer: Self-pay

## 2017-11-05 ENCOUNTER — Ambulatory Visit: Payer: Self-pay

## 2017-11-05 ENCOUNTER — Ambulatory Visit: Payer: Self-pay | Admitting: Hematology

## 2017-11-05 ENCOUNTER — Inpatient Hospital Stay: Payer: Medicaid Other

## 2017-11-05 VITALS — BP 116/74 | HR 72 | Temp 98.3°F | Resp 17

## 2017-11-05 DIAGNOSIS — Z5112 Encounter for antineoplastic immunotherapy: Secondary | ICD-10-CM | POA: Diagnosis not present

## 2017-11-05 DIAGNOSIS — C9010 Plasma cell leukemia not having achieved remission: Secondary | ICD-10-CM

## 2017-11-05 DIAGNOSIS — Z95828 Presence of other vascular implants and grafts: Secondary | ICD-10-CM

## 2017-11-05 LAB — PROTEIN ELECTROPHORESIS, SERUM
A/G RATIO SPE: 1 (ref 0.7–1.7)
ALBUMIN ELP: 3.2 g/dL (ref 2.9–4.4)
ALPHA-2-GLOBULIN: 0.7 g/dL (ref 0.4–1.0)
Alpha-1-Globulin: 0.2 g/dL (ref 0.0–0.4)
BETA GLOBULIN: 1 g/dL (ref 0.7–1.3)
GLOBULIN, TOTAL: 3.3 g/dL (ref 2.2–3.9)
Gamma Globulin: 1.4 g/dL (ref 0.4–1.8)
Total Protein ELP: 6.5 g/dL (ref 6.0–8.5)

## 2017-11-05 MED ORDER — HEPARIN SOD (PORK) LOCK FLUSH 100 UNIT/ML IV SOLN
500.0000 [IU] | Freq: Once | INTRAVENOUS | Status: AC | PRN
  Administered 2017-11-05: 500 [IU]
  Filled 2017-11-05: qty 5

## 2017-11-05 MED ORDER — PROCHLORPERAZINE MALEATE 10 MG PO TABS
10.0000 mg | ORAL_TABLET | Freq: Once | ORAL | Status: AC
  Administered 2017-11-05: 10 mg via ORAL

## 2017-11-05 MED ORDER — PROCHLORPERAZINE MALEATE 10 MG PO TABS
ORAL_TABLET | ORAL | Status: AC
  Filled 2017-11-05: qty 1

## 2017-11-05 MED ORDER — DEXAMETHASONE 4 MG PO TABS
40.0000 mg | ORAL_TABLET | Freq: Once | ORAL | Status: AC
  Administered 2017-11-05: 40 mg via ORAL

## 2017-11-05 MED ORDER — SODIUM CHLORIDE 0.9 % IV SOLN
Freq: Once | INTRAVENOUS | Status: AC
  Administered 2017-11-05: 09:00:00 via INTRAVENOUS
  Filled 2017-11-05: qty 250

## 2017-11-05 MED ORDER — SODIUM CHLORIDE 0.9% FLUSH
10.0000 mL | INTRAVENOUS | Status: DC | PRN
  Administered 2017-11-05: 10 mL
  Filled 2017-11-05: qty 10

## 2017-11-05 MED ORDER — DEXAMETHASONE 4 MG PO TABS
ORAL_TABLET | ORAL | Status: AC
  Filled 2017-11-05: qty 10

## 2017-11-05 MED ORDER — DEXTROSE 5 % IV SOLN
56.0000 mg/m2 | Freq: Once | INTRAVENOUS | Status: AC
  Administered 2017-11-05: 90 mg via INTRAVENOUS
  Filled 2017-11-05: qty 15

## 2017-11-05 MED ORDER — ZOLEDRONIC ACID 4 MG/100ML IV SOLN
4.0000 mg | Freq: Once | INTRAVENOUS | Status: AC
  Administered 2017-11-05: 4 mg via INTRAVENOUS
  Filled 2017-11-05: qty 100

## 2017-11-05 NOTE — Progress Notes (Signed)
The patient is approved for drug assistance by [employer] for Kyprolis. Enrollment is based on self pay and is effective until 11/05/18. Drug replacement begins for DOS 11/05/17.

## 2017-11-05 NOTE — Patient Instructions (Addendum)
[facility] Discharge Instructions for Patients Receiving Chemotherapy  Today you received the following chemotherapy agents: Kyprolis, Zometa  To help prevent nausea and vomiting after your treatment, we encourage you to take your nausea medication as directed.   If you develop nausea and vomiting that is not controlled by your nausea medication, call the clinic.   BELOW ARE SYMPTOMS THAT SHOULD BE REPORTED IMMEDIATELY:  *FEVER GREATER THAN 100.5 F  *CHILLS WITH OR WITHOUT FEVER  NAUSEA AND VOMITING THAT IS NOT CONTROLLED WITH YOUR NAUSEA MEDICATION  *UNUSUAL SHORTNESS OF BREATH  *UNUSUAL BRUISING OR BLEEDING  TENDERNESS IN MOUTH AND THROAT WITH OR WITHOUT PRESENCE OF ULCERS  *URINARY PROBLEMS  *BOWEL PROBLEMS  UNUSUAL RASH Items with * indicate a potential emergency and should be followed up as soon as possible.  Feel free to call the clinic should you have any questions or concerns. The clinic phone number is [phone].  Please show the [place] at check-in to the Emergency Department and triage nurse.

## 2017-11-05 NOTE — Telephone Encounter (Signed)
Oral Oncology Patient Advocate Encounter  [employer] application faxed 23/3/61  Will update final determination  [place] Patient [place] Phone [phone] Fax [phone]

## 2017-11-05 NOTE — Telephone Encounter (Signed)
Oral Oncology Patient Advocate Encounter  I received a fax from [place] stating that the patient no longer has insurance and sent a [employer] application. I filled it out and met with the patient in infusion to have her sign the form and made a copy of her tax return to send.   Will fax and update when I get doctors signature.   [place] Patient [place] Phone [phone] Fax [phone]

## 2017-11-06 ENCOUNTER — Telehealth: Payer: Self-pay | Admitting: Hematology

## 2017-11-06 LAB — IFE, DARA-SPECIFIC, SERUM
IgA: 222 mg/dL (ref 87–352)
IgG (Immunoglobin G), Serum: 1328 mg/dL (ref 700–1600)
IgM (Immunoglobulin M), Srm: 50 mg/dL (ref 26–217)

## 2017-11-06 NOTE — Telephone Encounter (Signed)
Appts scheduled letter/calendar mailed per 10/2 los °

## 2017-11-11 ENCOUNTER — Telehealth: Payer: Self-pay

## 2017-11-11 NOTE — Telephone Encounter (Signed)
Spoke with patient's daughter who speaks English to instruct her mother to take the patient survey, very important, she verbalized an understanding and will have her take care of it.

## 2017-11-12 NOTE — Telephone Encounter (Signed)
Oral Oncology Patient Advocate Encounter  [employer] application has been approved thru 02/02/18.   Pomalyst Rx will need to be escribed or faxed to [facility]. Fax # [phone]  They also included a note that the patient will need a new pregnancy test and authorization before dispense. Pregnancy testing is only good for 7 days from the date of the test.  I called daughter, [name] and left a message.   [place] Patient [place] Phone [phone] Fax [phone]

## 2017-11-17 NOTE — Progress Notes (Signed)
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    10/10/2014 Miscellaneous    Peripheral blood chemistry and leukocytosis with total white count 78K, comprised of large plasma cells and his normocytic anemia. There is a myeloid left shift with previous surgical radium blasts. Flow cytometry showed 64% plasma cells    10/10/2014 Bone Marrow Biopsy    Markedly hypercellular marrow (95%), Atypical plasma cells comprise 57% of the cellularity. There was diminished multilineage in hematopoiesis with adequate maturation. Breasts less than 1%), no overt dysplasia of the myeloid or erythroid lineages.     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    05/07/2015 - 05/12/2015 Hospital Admission    [facility], [facility]. ID workup was negative, EGD showed evidence of gastritis and duodenitis, no H. pylori or CMV.    05/20/2015 - 05/24/2015 Hospital Admission    [facility]    07/11/2015 Bone Marrow Biopsy    Post transplant 100 a bone marrow biopsy showed hypocellular marrow, 20%, no increase in plasma cells (2%) or other abnormalities.    08/22/2015 - 01/02/2016 Chemotherapy    MaintenanceCyborD (cytoxan 3036mm2 iv, bortezomib 1.5 mg/m, dexamethasone 40 mg, every 2 weeks, changed to Velcade maintenance after 4 months treatment    01/16/2016 - 04/19/2016 Chemotherapy    Maintenance Velcade 1.3 mg/m every 2 weeks    02/22/2016 Pathology Results    BONE MARROW: Diagnosis Bone Marrow, Aspirate,Biopsy, and Clot, right iliac - NORMOCELLULAR BONE MARROW FOR AGE WITH TRILINEAGE HEMATOPOIESIS. - PLASMACYTOSIS (PLASMA CELLS 12%). - SEE COMMENT. PERIPHERAL BLOOD: - OCCASIONAL CIRCULATING PLASMA CELLS.    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     04/18/2016 - 03/26/2017 Chemotherapy    Daratumumab per protocol  CyBorD every week, cytoxan was held after 04/24/2016 dye to cytopenia and infection  -discontinued due to Hep B flare    05/11/2016 - 05/16/2016 Hospital Admission    Healthcare-associated pneumonia    02/16/2017 - 02/21/2017 Hospital Admission    Admission diagnosis: Abnormal LFT's Additional comments: Assoc diagnoses: Hep B flare, transminitis, dehydration, fever, SIRS    04/06/2017 -  Chemotherapy    Carfilzomib 56 mg/m2 on days 1 and 15 (except 2028m2 on C1D1 and C1D2) with dexamethasone 20 mg on same days and pomalidomide 2 mg on days 1-21 of 28 day cycle        08/26/2017 - 08/28/2017 Hospital Admission    Admit date: 08/26/2017 Admission diagnosis: RLL  Pneumonia    CURRENT THERAPY:   1. carfilzomib 56 mg/m2 on days 1 and 15(except 60m/m2 on C1D1 and C1D2)with dexamethasone 20 mg on same days and pomalidomide 2 mg on days 1-21 of 28 day cycleon 04/13/17, dexa stopped on 07/06/2017 due to her CR  2.  Zometa every 4  weeks, started on 11/27/2015, plan for 2 years. Last dose 12/02/17   INTERVAL HISTORY: Ms. HHartsoughreturns for follow up and kyprolis as scheduled. She feels well, denies changes since last visit. She completed cycle 8 day 1 on 11/05/17. She has been off Pomalyst for 2 weeks due to insurance reasons. She was approved for [employer]. She has mild to moderate generalized body aches and discomfort that she feels is increased when off chemo. The pain makes her body feel tired. She remains functional, works part time. She takes tylenol rarely for pain, which helps her sleep. Appetite is normal. Denies n/v/c/d. No recent fever or chills, cough, chest pain, or dyspnea. Throat is a little sore. She got flu shot 3 weeks ago.    MEDICAL HISTORY:  Past Medical History:  Diagnosis Date  . Chills with fever    intermittently since d/c from hospital  . Dysuria-frequency syndrome    w/ pink urine  . GERD (gastroesophageal reflux disease)   . Hepatitis   . History of positive PPD    DX 2011--  CXR DONE NO EVIDENCE  . History of ureter stent   . Hydronephrosis, right   . Neuromuscular disorder (HCC)    legs numb intermittently  . 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[place]   . Pneumonia   . Right ureteral stone   . Urosepsis 8/14   ad[facility] to [facility]    SURGICAL HISTORY: Past Surgical History:  Procedure Laterality Date  . CYSTOSCOPY W/ URETERAL STENT PLACEMENT Right 09/25/2012   Procedure: CYSTOSCOPY WITH RETROGRADE PYELOGRAM/URETERAL STENT PLACEMENT;  Surgeon: TAlexis Frock MD;  Location: [facility];  Service: Urology;  Laterality: Right;  . CYSTOSCOPY WITH RETROGRADE PYELOGRAM, URETEROSCOPY AND STENT PLACEMENT Right 10/15/2012   Procedure: CYSTOSCOPY WITH RETROGRADE PYELOGRAM, URETEROSCOPY AND REMOVAL STENT WITH  STENT PLACEMENT;  Surgeon: TAlexis Frock MD;  [facility]  Service: Urology;  Laterality: Right;  . ESOPHAGOGASTRODUODENOSCOPY (EGD) WITH PROPOFOL N/A 11/16/2014   Procedure:  ESOPHAGOGASTRODUODENOSCOPY (EGD) WITH PROPOFOL;  Surgeon: DMilus Banister MD;  Location: [facility];  Service: Endoscopy;  Laterality: N/A;  . HOLMIUM LASER APPLICATION Right 91/69/4503  Procedure: HOLMIUM LASER APPLICATION;  Surgeon: TAlexis Frock MD;  [facility]  Service: Urology;  Laterality: Right;  . LIVER BIOPSY    . OTHER SURGICAL HISTORY Right    removal of ovarian cyst  . removal of uterine cyst     years ago  . RIGHT VATS W/ DRAINAGE PEURAL EFFUSION AND BX'S  10-30-2008    I have reviewed the social history and family history with the patient and they are unchanged from previous note.  ALLERGIES:  has No Known Allergies.  MEDICATIONS:  Current Outpatient Medications  Medication Sig Dispense Refill  . acyclovir (ZOVIRAX) 800 MG tablet Take 1 tablet (800 mg total) by mouth 2 (two) times daily. 60 tablet 5  . chlorpheniramine-HYDROcodone (TUSSIONEX) 10-8 MG/5ML SUER Take 5 mLs by mouth every 12 (twelve) hours as needed for cough. 140 mL 0  . feeding supplement, ENSURE ENLIVE, (ENSURE ENLIVE) LIQD Take 237 mLs by mouth 2 (two) times daily between meals. 237 mL 12  . ibuprofen (ADVIL,MOTRIN)  200 MG tablet Take 200 mg by mouth every 6 (six) hours as needed for moderate pain.    . magic mouthwash SOLN Take 5 mLs by mouth 4 (four) times daily as needed for mouth pain. 240 mL 3  . omeprazole (PRILOSEC) 20 MG capsule Take 1 capsule (20 mg total) by mouth daily. 30 capsule 5  . ondansetron (ZOFRAN ODT) 8 MG disintegrating tablet Take 1 tablet (8 mg total) by mouth every 8 (eight) hours as needed for nausea or vomiting. 40 tablet 1  . polyethylene glycol (MIRALAX / GLYCOLAX) packet Take 17 g by mouth daily as needed (constipation). 14 each 1  . potassium chloride SA (K-DUR,KLOR-CON) 20 MEQ tablet Take 1 tablet (20 mEq total) by mouth daily. 30 tablet 0  . prochlorperazine (COMPAZINE) 10 MG tablet Take 1 tablet (10 mg total) by mouth every 6 (six) hours as  needed for nausea or vomiting. 30 tablet 3  . promethazine (PHENERGAN) 25 MG tablet Take 1 tablet (25 mg total) by mouth every 6 (six) hours as needed for nausea or vomiting. 30 tablet 0  . VIREAD 300 MG tablet Take 1 tablet (300 mg total) by mouth daily. 30 tablet 11  . pomalidomide (POMALYST) 2 MG capsule Take 1 capsule (2 mg total) by mouth daily. Take with water on days 1-21. Repeat every 28 days. 21 capsule 0   No current facility-administered medications for this visit.    Facility-Administered Medications Ordered in Other Visits  Medication Dose Route Frequency Provider Last Rate Last Dose  . sodium chloride 0.9 % injection 10 mL  10 mL Intravenous PRN Truitt Merle, MD   10 mL at 12/11/15 1532  . sodium chloride 0.9 % injection 10 mL  10 mL Intravenous PRN Truitt Merle, MD   10 mL at 06/11/16 1505  . sodium chloride 0.9 % injection 10 mL  10 mL Intravenous PRN Truitt Merle, MD   10 mL at 08/04/17 1643  . sodium chloride flush (NS) 0.9 % injection 10 mL  10 mL Intravenous PRN Truitt Merle, MD   10 mL at 10/09/15 1717    PHYSICAL EXAMINATION: ECOG PERFORMANCE STATUS: 1 - Symptomatic but completely ambulatory  Vitals:   11/18/17 0853  BP: 116/73  Pulse: 71  Resp: 18  Temp: 97.7 F (36.5 C)  SpO2: 100%   Filed Weights   11/18/17 0853  Weight: 132 lb 6.4 oz (60.1 kg)    GENERAL:alert, no distress and comfortable SKIN: no rashes or significant lesions EYES:  sclera clear OROPHARYNX:no thrush or ulcers  LYMPH:  no palpable cervical or supraclavicular lymphadenopathy  LUNGS: clear to auscultation with normal breathing effort HEART: regular rate & rhythm, no lower extremity edema ABDOMEN:abdomen soft, non-tender and normal bowel sounds Musculoskeletal:no cyanosis of digits and no clubbing  NEURO: alert & oriented x 3 with fluent speech, no focal motor/sensory deficits PAC without erythema   LABORATORY DATA:  I have reviewed the data as listed CBC Latest Ref Rng & Units 11/18/2017  11/04/2017 10/22/2017  WBC 4.0 - 10.5 K/uL 2.7(L) 3.4(L) 3.4(L)  Hemoglobin 12.0 - 15.0 g/dL 10.6(L) 10.8(L) 10.5(L)  Hematocrit 36.0 - 46.0 % 34.0(L) 33.0(L) 32.8(L)  Platelets 150 - 400 K/uL 220 255 175     CMP Latest Ref Rng & Units 11/18/2017 11/04/2017 10/22/2017  Glucose 70 - 99 mg/dL 98 74 95  BUN 6 - 20 mg/dL '12 17 13  ' Creatinine 0.44 - 1.00 mg/dL 0.69 0.70 0.73  Sodium 135 - 145 mmol/L 143  143 143  Potassium 3.5 - 5.1 mmol/L 3.4(L) 3.7 3.4(L)  Chloride 98 - 111 mmol/L 109 108 109  CO2 22 - 32 mmol/L '27 27 26  ' Calcium 8.9 - 10.3 mg/dL 9.1 9.2 9.3  Total Protein 6.5 - 8.1 g/dL 6.7 7.0 7.0  Total Bilirubin 0.3 - 1.2 mg/dL 0.3 0.2(L) 0.3  Alkaline Phos 38 - 126 U/L 80 83 75  AST 15 - 41 U/L '24 23 21  ' ALT 0 - 44 U/L 7 7 <6   SPEP M-protein  09/15/2014: 4.2 10/08/14: 4.6 12/08/2014: 0.2 02/15/2015: not sufficient sample for test   09/11/2015: not observed 10/09/2015: not det   11/06/2015: not det  12/25/2015: not det  02/13/2016: 0.3 03/12/2016: 0.8 04/09/2016: 0.6 05/08/16: 0.1 06/04/16: 0.1 07/02/2016: 0.2 07/09/2016: 0.2 (dara specific IFE negative) 08/05/16: Not observed  09/01/16: Not observed 09/29/16: 0.1(dara specific IFE negative) 10/27/16: Not observed 11/24/16 : Not observed 12/23/16: 0.1 (Dara specific IFE showed IgG monoclonal protein with lambda light chain) 01/21/2017: 0.1 (Dara specific IFE showed IgG monoclonal protein with lambda light chain) 03/26/17: 0.2 04/27/17: 0.1 05/25/17: Not observed 06/23/17: Not observed 08/31/17: 0.5 09/29/2017; not observed  10/22/17: Not Observed (Dara specific IFE negative) 11/04/17: not observed  11/18/17: PENDING  IgG mg/dl  11/03/2014: 3150  12/08/2014:  759 02/14/2014: 860 09/11/2015: 1347 10/09/2015: 1457 11/06/2015: 1504 12/25/2015: 1400 02/13/2016: 1543 03/12/2016: 1860 04/09/2016: 1620 05/08/16: 749 06/04/16: 751 5/30/20187: 796 07/09/2016: 701 08/05/16: 678 09/01/16: 650 09/29/16: 727 10/27/16: 634 11/24/16: 685  12/23/16: 714 01/21/2017:  709 03/26/17: 874 04/27/17: 1067 05/25/17: 1161 06/23/17: 1211 08/17/17: 1336 08/31/17: 1438 10/22/17: 1,390 11/04/17: 1328 11/18/17: PENDING   Kappa/lambda light chains levels and ration  11/03/14: 0.75, 152, 0.00 12/22/2014: 1.10, 2.60, 0.42 02/15/2015: 9.59,14.35, 0.67 09/11/2015: 2.44, 2.72, 0.90 10/09/2015: 3.09, 3.49, 0.89 11/06/2015: 2.30, 2.62, 0.88 12/25/2015: 2.51, 3.0, 0.84 02/13/2016: 2.64, 15.3, 0.17 03/12/2016: 2.27, 45.5, 0.05 04/09/2016: 3.33, 45.5, 0.07 05/08/2016: 0.49, 1.21, 0.40 06/04/2016: 0.83, 1.11, 0.75 07/02/16: 0.51, 1.43, 0.36 07/23/16: 0.68, 1.02, 0.67 08/05/16: 0.62, 1.09, 0.57 09/01/16: 0.80, 1.21, 0.66 09/29/16: 0.44, 1.07, 0.41 10/27/16: 0.63, 1.44, 0.44 11/24/16: 0.83, 2.13, 0.39 12/23/16: 0.69, 2.31, 0.30 01/21/2017: 0.77, 3.08, 0.25   24 h urine UPEP/IFE and light chain: 11/03/2014: IFE showed a monoclonal IgG heavy chain with associated lambda light chain. M protein was Undetectable    PATHOLOGY   01/21/2018 Cytogenetics  Normal  PATHOLOGY REPORT  Bone Marrow (BM) and Peripheral Blood (PB) FINAL PATHOLOGIC DIAGNOSIS  BONE MARROW: 07/11/2015 Hypocellular marrow (20%) with no increase in plasma cells (2%). See comment. PERIPHERAL BLOOD: Mild anemia. No circulating plasma cells identified. See comment and CBC data.  BONE MARROW: 02/22/2016 Diagnosis Bone Marrow, Aspirate,Biopsy, and Clot, right iliac - NORMOCELLULAR BONE MARROW FOR AGE WITH TRILINEAGE HEMATOPOIESIS. - PLASMACYTOSIS (PLASMA CELLS 12%). - SEE COMMENT. PERIPHERAL BLOOD: - OCCASIONAL CIRCULATING PLASMA CELLS.   PROCEDURES  Bone Marrow Biopsy, 01/21/17 Bone Marrow, Aspirate, Biopsy, and Clot - NORMOCELLULAR BONE MARROW FOR AGE WITH TRILINEAGE HEMATOPOIESIS AND 2% PLASMA CELLS. PERIPHERAL BLOOD: - NORMOCYTIC-HYPOCHROMIC ANEMIA.    RADIOGRAPHIC STUDIES: I have personally reviewed the radiological images as listed and agreed with the findings in the report. No results  found.   ASSESSMENT & PLAN: 47 y.o. [place] woman, presented with anemia and leokocytosis    1.  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 Transaminitis and hyperbilirubinemia, secondary to hepatitis B flare - on viread 3. Type 2 diabetes, steroids induced  -This is likely steroids  induced hyperglycemia -she was on metformin when she was on dexa 4. GERD, history of GI bleeding and nausea  -Colonoscopy on 11/26/16 per Dr. Ardis Hughs notable for a polyp in the transverse colon, revealed to be tubular adenoma and negative for high grade dysplasia or malignancy.  5. Constipation 6. Body pain 7. Insurance issue  Ms. Erhart appears stable. She completed cycle 8 day 1 kyprolis on 10/3. She tolerates treatment well except generalized mild body aches and fatigue. She has been off pomalidomide for 2 weeks due to insurance lapse and not being able to refill. Labs reviewed, CBC with mild neutropenia, stable anemia. CMP is stable. Recent M protein remains undetectable, SPEP is pending today. Will continue current treatment plan. She will proceed with day 15 kyprolis today. I refilled pomalidomide today, she will start next cycle when she gets the supply, and take from days 1-21 with 7 days off. She qualified for [employer] for co-pay assistance with prescription medication. I refilled viread to [facility] pharm. She will return in 2 weeks for cycle 9 day 1, and f/u in 4 weeks. She will receive last zometa infusion on 10/30, to complete 2 years.   Plan  -labs reviewed, adequate to proceed with cycle 8 day 15 kyprolis today, continue q2 weeks -Refilled pomalyst to [facility] pharmacy, she will restart next cycle when she gets the supply, take for 21 days then 1 week off -refilled viread -return in 2 weeks for next cycle Kyprolis, f/u in 4 weeks  -last Zometa in 2 weeks on 12/02/17 to complete 2 years  -SPEP pending   Orders Placed This Encounter  Procedures  . Pregnancy, urine    Standing  Status:   Future    Number of Occurrences:   1    Standing Expiration Date:   11/18/2018   All questions were answered. The patient knows to call the clinic with any problems, questions or concerns. No barriers to learning was detected.     Alla Feeling, NP 11/18/17

## 2017-11-18 ENCOUNTER — Encounter: Payer: Self-pay | Admitting: Nurse Practitioner

## 2017-11-18 ENCOUNTER — Inpatient Hospital Stay: Payer: Medicaid Other

## 2017-11-18 ENCOUNTER — Other Ambulatory Visit: Payer: Self-pay | Admitting: Emergency Medicine

## 2017-11-18 ENCOUNTER — Inpatient Hospital Stay (HOSPITAL_BASED_OUTPATIENT_CLINIC_OR_DEPARTMENT_OTHER): Payer: Medicaid Other | Admitting: Nurse Practitioner

## 2017-11-18 VITALS — BP 116/73 | HR 71 | Temp 97.7°F | Resp 18 | Ht 62.0 in | Wt 132.4 lb

## 2017-11-18 DIAGNOSIS — B181 Chronic viral hepatitis B without delta-agent: Secondary | ICD-10-CM

## 2017-11-18 DIAGNOSIS — C9012 Plasma cell leukemia in relapse: Secondary | ICD-10-CM

## 2017-11-18 DIAGNOSIS — B1910 Unspecified viral hepatitis B without hepatic coma: Secondary | ICD-10-CM

## 2017-11-18 DIAGNOSIS — C9010 Plasma cell leukemia not having achieved remission: Secondary | ICD-10-CM

## 2017-11-18 DIAGNOSIS — Z95828 Presence of other vascular implants and grafts: Secondary | ICD-10-CM

## 2017-11-18 DIAGNOSIS — R5383 Other fatigue: Secondary | ICD-10-CM

## 2017-11-18 DIAGNOSIS — K5900 Constipation, unspecified: Secondary | ICD-10-CM

## 2017-11-18 DIAGNOSIS — Z5112 Encounter for antineoplastic immunotherapy: Secondary | ICD-10-CM | POA: Diagnosis not present

## 2017-11-18 DIAGNOSIS — M791 Myalgia, unspecified site: Secondary | ICD-10-CM

## 2017-11-18 DIAGNOSIS — E099 Drug or chemical induced diabetes mellitus without complications: Secondary | ICD-10-CM

## 2017-11-18 DIAGNOSIS — K219 Gastro-esophageal reflux disease without esophagitis: Secondary | ICD-10-CM

## 2017-11-18 LAB — CBC WITH DIFFERENTIAL (CANCER CENTER ONLY)
ABS IMMATURE GRANULOCYTES: 0.02 10*3/uL (ref 0.00–0.07)
Basophils Absolute: 0 10*3/uL (ref 0.0–0.1)
Basophils Relative: 1 %
Eosinophils Absolute: 0.1 10*3/uL (ref 0.0–0.5)
Eosinophils Relative: 3 %
HCT: 34 % — ABNORMAL LOW (ref 36.0–46.0)
HEMOGLOBIN: 10.6 g/dL — AB (ref 12.0–15.0)
Immature Granulocytes: 1 %
LYMPHS ABS: 0.7 10*3/uL (ref 0.7–4.0)
LYMPHS PCT: 25 %
MCH: 30.1 pg (ref 26.0–34.0)
MCHC: 31.2 g/dL (ref 30.0–36.0)
MCV: 96.6 fL (ref 80.0–100.0)
MONO ABS: 0.3 10*3/uL (ref 0.1–1.0)
MONOS PCT: 10 %
NEUTROS ABS: 1.6 10*3/uL — AB (ref 1.7–7.7)
Neutrophils Relative %: 60 %
Platelet Count: 220 10*3/uL (ref 150–400)
RBC: 3.52 MIL/uL — ABNORMAL LOW (ref 3.87–5.11)
RDW: 13.8 % (ref 11.5–15.5)
WBC Count: 2.7 10*3/uL — ABNORMAL LOW (ref 4.0–10.5)
nRBC: 0 % (ref 0.0–0.2)

## 2017-11-18 LAB — CMP (CANCER CENTER ONLY)
ALK PHOS: 80 U/L (ref 38–126)
ALT: 7 U/L (ref 0–44)
AST: 24 U/L (ref 15–41)
Albumin: 3.2 g/dL — ABNORMAL LOW (ref 3.5–5.0)
Anion gap: 7 (ref 5–15)
BUN: 12 mg/dL (ref 6–20)
CALCIUM: 9.1 mg/dL (ref 8.9–10.3)
CO2: 27 mmol/L (ref 22–32)
CREATININE: 0.69 mg/dL (ref 0.44–1.00)
Chloride: 109 mmol/L (ref 98–111)
Glucose, Bld: 98 mg/dL (ref 70–99)
Potassium: 3.4 mmol/L — ABNORMAL LOW (ref 3.5–5.1)
Sodium: 143 mmol/L (ref 135–145)
Total Bilirubin: 0.3 mg/dL (ref 0.3–1.2)
Total Protein: 6.7 g/dL (ref 6.5–8.1)

## 2017-11-18 LAB — PREGNANCY, URINE: Preg Test, Ur: NEGATIVE

## 2017-11-18 MED ORDER — HEPARIN SOD (PORK) LOCK FLUSH 100 UNIT/ML IV SOLN
500.0000 [IU] | Freq: Once | INTRAVENOUS | Status: AC | PRN
  Administered 2017-11-18: 500 [IU]
  Filled 2017-11-18: qty 5

## 2017-11-18 MED ORDER — VIREAD 300 MG PO TABS: 300.0000 mg | ORAL_TABLET | Freq: Every day | ORAL | 11 refills | Status: DC

## 2017-11-18 MED ORDER — POMALIDOMIDE 2 MG PO CAPS: 2.0000 mg | ORAL_CAPSULE | Freq: Every day | ORAL | 0 refills | Status: DC

## 2017-11-18 MED ORDER — SODIUM CHLORIDE 0.9 % IV SOLN
Freq: Once | INTRAVENOUS | Status: AC
  Administered 2017-11-18: 10:00:00 via INTRAVENOUS
  Filled 2017-11-18: qty 250

## 2017-11-18 MED ORDER — PROCHLORPERAZINE MALEATE 10 MG PO TABS
10.0000 mg | ORAL_TABLET | Freq: Once | ORAL | Status: AC
  Administered 2017-11-18: 10 mg via ORAL

## 2017-11-18 MED ORDER — SODIUM CHLORIDE 0.9 % IJ SOLN
10.0000 mL | INTRAMUSCULAR | Status: DC | PRN
  Administered 2017-11-18: 10 mL via INTRAVENOUS
  Filled 2017-11-18: qty 10

## 2017-11-18 MED ORDER — SODIUM CHLORIDE 0.9% FLUSH
10.0000 mL | INTRAVENOUS | Status: DC | PRN
  Administered 2017-11-18: 10 mL
  Filled 2017-11-18: qty 10

## 2017-11-18 MED ORDER — DEXTROSE 5 % IV SOLN
56.0000 mg/m2 | Freq: Once | INTRAVENOUS | Status: AC
  Administered 2017-11-18: 90 mg via INTRAVENOUS
  Filled 2017-11-18: qty 15

## 2017-11-18 MED ORDER — SODIUM CHLORIDE 0.9 % IV SOLN
Freq: Once | INTRAVENOUS | Status: AC
  Administered 2017-11-18: 11:00:00 via INTRAVENOUS
  Filled 2017-11-18: qty 250

## 2017-11-18 MED ORDER — PROCHLORPERAZINE MALEATE 10 MG PO TABS
ORAL_TABLET | ORAL | Status: AC
  Filled 2017-11-18: qty 1

## 2017-11-18 NOTE — Patient Instructions (Signed)
[facility] Discharge Instructions for Patients Receiving Chemotherapy  Today you received the following chemotherapy agents:  Kyprolis  To help prevent nausea and vomiting after your treatment, we encourage you to take your nausea medication as directed.   If you develop nausea and vomiting that is not controlled by your nausea medication, call the clinic.   BELOW ARE SYMPTOMS THAT SHOULD BE REPORTED IMMEDIATELY:  *FEVER GREATER THAN 100.5 F  *CHILLS WITH OR WITHOUT FEVER  NAUSEA AND VOMITING THAT IS NOT CONTROLLED WITH YOUR NAUSEA MEDICATION  *UNUSUAL SHORTNESS OF BREATH  *UNUSUAL BRUISING OR BLEEDING  TENDERNESS IN MOUTH AND THROAT WITH OR WITHOUT PRESENCE OF ULCERS  *URINARY PROBLEMS  *BOWEL PROBLEMS  UNUSUAL RASH Items with * indicate a potential emergency and should be followed up as soon as possible.  Feel free to call the clinic should you have any questions or concerns. The clinic phone number is [phone].  Please show the CHEMO ALERT CARD at check-in to the Emergency Department and triage nurse.   

## 2017-11-19 ENCOUNTER — Telehealth: Payer: Self-pay | Admitting: Hematology

## 2017-11-19 ENCOUNTER — Telehealth: Payer: Self-pay

## 2017-11-19 LAB — PROTEIN ELECTROPHORESIS, SERUM
A/G RATIO SPE: 1.1 (ref 0.7–1.7)
ALPHA-1-GLOBULIN: 0.2 g/dL (ref 0.0–0.4)
Albumin ELP: 3.2 g/dL (ref 2.9–4.4)
Alpha-2-Globulin: 0.7 g/dL (ref 0.4–1.0)
Beta Globulin: 0.9 g/dL (ref 0.7–1.3)
GLOBULIN, TOTAL: 2.9 g/dL (ref 2.2–3.9)
Gamma Globulin: 1 g/dL (ref 0.4–1.8)
TOTAL PROTEIN ELP: 6.1 g/dL (ref 6.0–8.5)

## 2017-11-19 LAB — IFE, DARA-SPECIFIC, SERUM
IGG (IMMUNOGLOBIN G), SERUM: 1083 mg/dL (ref 700–1600)
IgA: 175 mg/dL (ref 87–352)
IgM (Immunoglobulin M), Srm: 41 mg/dL (ref 26–217)

## 2017-11-19 NOTE — Telephone Encounter (Signed)
Called [facility] [employer] and spoke with associate who noted that authorization number from 11/18/17 [id number] was noted as expired. Created new authorization number for script and called pharmacy back and spoke with [facility]. This authorization number [id number] noted to be flagged. Called [employer] to discuss. Spoke with representative, Clair Gulling, who noted that this was d/t to pt answer to question that she has had answered yes to having natural menopause, to which the nurse answered no. Pregnancy test noted from 11/18/17 to be negative. Flag removed from authorization number. Returned call to [employer] by [employer] and spoke with Herbert Spires to check authorization number again. Confirmed that authorization #[id number] is confirmed for refill of pomalyst 2mg  21 day supply for pt.

## 2017-11-19 NOTE — Telephone Encounter (Signed)
Appts scheduled patient to get updated schedule at next visit per 10/16 los

## 2017-11-23 MED FILL — VIREAD 300 MG TABLET: 300 | 30 days supply | Qty: 30 | Fill #0

## 2017-12-02 ENCOUNTER — Inpatient Hospital Stay: Payer: Medicaid Other

## 2017-12-02 VITALS — BP 125/87 | HR 77 | Temp 98.0°F | Resp 19 | Wt 132.0 lb

## 2017-12-02 DIAGNOSIS — Z5112 Encounter for antineoplastic immunotherapy: Secondary | ICD-10-CM | POA: Diagnosis not present

## 2017-12-02 DIAGNOSIS — Z95828 Presence of other vascular implants and grafts: Secondary | ICD-10-CM

## 2017-12-02 DIAGNOSIS — C9010 Plasma cell leukemia not having achieved remission: Secondary | ICD-10-CM

## 2017-12-02 DIAGNOSIS — C9012 Plasma cell leukemia in relapse: Secondary | ICD-10-CM

## 2017-12-02 LAB — CMP (CANCER CENTER ONLY)
ALK PHOS: 103 U/L (ref 38–126)
ALT: 8 U/L (ref 0–44)
AST: 26 U/L (ref 15–41)
Albumin: 3.5 g/dL (ref 3.5–5.0)
Anion gap: 7 (ref 5–15)
BUN: 16 mg/dL (ref 6–20)
CALCIUM: 9.7 mg/dL (ref 8.9–10.3)
CO2: 29 mmol/L (ref 22–32)
Chloride: 105 mmol/L (ref 98–111)
Creatinine: 0.77 mg/dL (ref 0.44–1.00)
GFR, Estimated: >60 mL/min (ref >60)
Glucose, Bld: 104 mg/dL — ABNORMAL HIGH (ref 70–99)
Potassium: 3.6 mmol/L (ref 3.5–5.1)
Sodium: 141 mmol/L (ref 135–145)
TOTAL PROTEIN: 7.1 g/dL (ref 6.5–8.1)
Total Bilirubin: 0.3 mg/dL (ref 0.3–1.2)

## 2017-12-02 LAB — CBC WITH DIFFERENTIAL (CANCER CENTER ONLY)
Abs Immature Granulocytes: 0.03 10*3/uL (ref 0.00–0.07)
BASOS PCT: 0 %
Basophils Absolute: 0 10*3/uL (ref 0.0–0.1)
EOS ABS: 0.2 10*3/uL (ref 0.0–0.5)
EOS PCT: 4 %
HCT: 33.9 % — ABNORMAL LOW (ref 36.0–46.0)
Hemoglobin: 10.7 g/dL — ABNORMAL LOW (ref 12.0–15.0)
Immature Granulocytes: 1 %
Lymphocytes Relative: 16 %
Lymphs Abs: 0.7 10*3/uL (ref 0.7–4.0)
MCH: 29.9 pg (ref 26.0–34.0)
MCHC: 31.6 g/dL (ref 30.0–36.0)
MCV: 94.7 fL (ref 80.0–100.0)
MONO ABS: 0.4 10*3/uL (ref 0.1–1.0)
MONOS PCT: 8 %
Neutro Abs: 3.2 10*3/uL (ref 1.7–7.7)
Neutrophils Relative %: 71 %
PLATELETS: 180 10*3/uL (ref 150–400)
RBC: 3.58 MIL/uL — ABNORMAL LOW (ref 3.87–5.11)
RDW: 13.2 % (ref 11.5–15.5)
WBC Count: 4.5 10*3/uL (ref 4.0–10.5)
nRBC: 0 % (ref 0.0–0.2)

## 2017-12-02 LAB — PREGNANCY, URINE: Preg Test, Ur: NEGATIVE

## 2017-12-02 MED ORDER — SODIUM CHLORIDE 0.9 % IV SOLN
Freq: Once | INTRAVENOUS | Status: AC
  Administered 2017-12-02: 16:00:00 via INTRAVENOUS
  Filled 2017-12-02: qty 250

## 2017-12-02 MED ORDER — ZOLEDRONIC ACID 4 MG/100ML IV SOLN
4.0000 mg | Freq: Once | INTRAVENOUS | Status: AC
  Administered 2017-12-02: 4 mg via INTRAVENOUS
  Filled 2017-12-02: qty 100

## 2017-12-02 MED ORDER — DEXTROSE 5 % IV SOLN
56.0000 mg/m2 | Freq: Once | INTRAVENOUS | Status: AC
  Administered 2017-12-02: 90 mg via INTRAVENOUS
  Filled 2017-12-02: qty 30

## 2017-12-02 MED ORDER — HEPARIN SOD (PORK) LOCK FLUSH 100 UNIT/ML IV SOLN
500.0000 [IU] | Freq: Once | INTRAVENOUS | Status: AC | PRN
  Administered 2017-12-02: 500 [IU]
  Filled 2017-12-02: qty 5

## 2017-12-02 MED ORDER — SODIUM CHLORIDE 0.9% FLUSH
10.0000 mL | INTRAVENOUS | Status: DC | PRN
  Administered 2017-12-02: 10 mL
  Filled 2017-12-02: qty 10

## 2017-12-02 MED ORDER — PROCHLORPERAZINE MALEATE 10 MG PO TABS
10.0000 mg | ORAL_TABLET | Freq: Once | ORAL | Status: AC
  Administered 2017-12-02: 10 mg via ORAL

## 2017-12-02 MED ORDER — PROCHLORPERAZINE MALEATE 10 MG PO TABS
ORAL_TABLET | ORAL | Status: AC
  Filled 2017-12-02: qty 1

## 2017-12-02 MED ORDER — SODIUM CHLORIDE 0.9 % IV SOLN
Freq: Once | INTRAVENOUS | Status: AC
  Administered 2017-12-02: 15:00:00 via INTRAVENOUS
  Filled 2017-12-02: qty 250

## 2017-12-02 MED ORDER — SODIUM CHLORIDE 0.9 % IJ SOLN
10.0000 mL | INTRAMUSCULAR | Status: DC | PRN
  Administered 2017-12-02: 10 mL via INTRAVENOUS
  Filled 2017-12-02: qty 10

## 2017-12-02 NOTE — Patient Instructions (Addendum)
[facility] Discharge Instructions for Patients Receiving Chemotherapy  Today you received the following chemotherapy agents Kyprolis  To help prevent nausea and vomiting after your treatment, we encourage you to take your nausea medication as directed   If you develop nausea and vomiting that is not controlled by your nausea medication, call the clinic.   BELOW ARE SYMPTOMS THAT SHOULD BE REPORTED IMMEDIATELY:  *FEVER GREATER THAN 100.5 F  *CHILLS WITH OR WITHOUT FEVER  NAUSEA AND VOMITING THAT IS NOT CONTROLLED WITH YOUR NAUSEA MEDICATION  *UNUSUAL SHORTNESS OF BREATH  *UNUSUAL BRUISING OR BLEEDING  TENDERNESS IN MOUTH AND THROAT WITH OR WITHOUT PRESENCE OF ULCERS  *URINARY PROBLEMS  *BOWEL PROBLEMS  UNUSUAL RASH Items with * indicate a potential emergency and should be followed up as soon as possible.  Feel free to call the clinic should you have any questions or concerns. The clinic phone number is [phone].  Please show the [place] at check-in to the Emergency Department and triage nurse.  Zoledronic Acid injection (Hypercalcemia, Oncology) What is this medicine? ZOLEDRONIC ACID (ZOE le dron ik AS id) lowers the amount of calcium loss from bone. It is used to treat too much calcium in your blood from cancer. It is also used to prevent complications of cancer that has spread to the bone. This medicine may be used for other purposes; ask your health care provider or pharmacist if you have questions. COMMON BRAND NAME(S): Zometa What should I tell my health care provider before I take this medicine? They need to know if you have any of these conditions: -aspirin-sensitive asthma -cancer, especially if you are receiving medicines used to treat cancer -dental disease or wear dentures -infection -kidney disease -receiving corticosteroids like dexamethasone or prednisone -an unusual or allergic reaction to zoledronic acid, other medicines,  foods, dyes, or preservatives -pregnant or trying to get pregnant -breast-feeding How should I use this medicine? This medicine is for infusion into a vein. It is given by a health care professional in a hospital or clinic setting. Talk to your pediatrician regarding the use of this medicine in children. Special care may be needed. Overdosage: If you think you have taken too much of this medicine contact a poison control center or emergency room at once. NOTE: This medicine is only for you. Do not share this medicine with others. What if I miss a dose? It is important not to miss your dose. Call your doctor or health care professional if you are unable to keep an appointment. What may interact with this medicine? -certain antibiotics given by injection -NSAIDs, medicines for pain and inflammation, like ibuprofen or naproxen -some diuretics like bumetanide, furosemide -teriparatide -thalidomide This list may not describe all possible interactions. Give your health care provider a list of all the medicines, herbs, non-prescription drugs, or dietary supplements you use. Also tell them if you smoke, drink alcohol, or use illegal drugs. Some items may interact with your medicine. What should I watch for while using this medicine? Visit your doctor or health care professional for regular checkups. It may be some time before you see the benefit from this medicine. Do not stop taking your medicine unless your doctor tells you to. Your doctor may order blood tests or other tests to see how you are doing. Women should inform their doctor if they wish to become pregnant or think they might be pregnant. There is a potential for serious side effects to an unborn child. Talk to  health care professional or pharmacist for more information. You should make sure that you get enough calcium and vitamin D while you are taking this medicine. Discuss the foods you eat and the vitamins you take with your health  care professional. Some people who take this medicine have severe bone, joint, and/or muscle pain. This medicine may also increase your risk for jaw problems or a broken thigh bone. Tell your doctor right away if you have severe pain in your jaw, bones, joints, or muscles. Tell your doctor if you have any pain that does not go away or that gets worse. Tell your dentist and dental surgeon that you are taking this medicine. You should not have major dental surgery while on this medicine. See your dentist to have a dental exam and fix any dental problems before starting this medicine. Take good care of your teeth while on this medicine. Make sure you see your dentist for regular follow-up appointments. What side effects may I notice from receiving this medicine? Side effects that you should report to your doctor or health care professional as soon as possible: -allergic reactions like skin rash, itching or hives, swelling of the face, lips, or tongue -anxiety, confusion, or depression -breathing problems -changes in vision -eye pain -feeling faint or lightheaded, falls -jaw pain, especially after dental work -mouth sores -muscle cramps, stiffness, or weakness -redness, blistering, peeling or loosening of the skin, including inside the mouth -trouble passing urine or change in the amount of urine Side effects that usually do not require medical attention (report to your doctor or health care professional if they continue or are bothersome): -bone, joint, or muscle pain -constipation -diarrhea -fever -hair loss -irritation at site where injected -loss of appetite -nausea, vomiting -stomach upset -trouble sleeping -trouble swallowing -weak or tired This list may not describe all possible side effects. Call your doctor for medical advice about side effects. You may report side effects to FDA at 1-800-FDA-1088. Where should I keep my medicine? This drug is given in a hospital or clinic and  will not be stored at home. NOTE: This sheet is a summary. It may not cover all possible information. If you have questions about this medicine, talk to your doctor, pharmacist, or health care provider.  2018 [employer] (2013-06-18 14:19:39)    

## 2017-12-03 LAB — IFE, DARA-SPECIFIC, SERUM
IGA: 185 mg/dL (ref 87–352)
IGM (IMMUNOGLOBULIN M), SRM: 35 mg/dL (ref 26–217)
IgG (Immunoglobin G), Serum: 1131 mg/dL (ref 700–1600)

## 2017-12-03 LAB — PROTEIN ELECTROPHORESIS, SERUM
A/G Ratio: 1 (ref 0.7–1.7)
ALBUMIN ELP: 3.4 g/dL (ref 2.9–4.4)
Alpha-1-Globulin: 0.3 g/dL (ref 0.0–0.4)
Alpha-2-Globulin: 0.9 g/dL (ref 0.4–1.0)
BETA GLOBULIN: 1 g/dL (ref 0.7–1.3)
Gamma Globulin: 1.1 g/dL (ref 0.4–1.8)
Globulin, Total: 3.3 g/dL (ref 2.2–3.9)
Total Protein ELP: 6.7 g/dL (ref 6.0–8.5)

## 2017-12-15 NOTE — Progress Notes (Signed)
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    10/10/2014 Miscellaneous    Peripheral blood chemistry and leukocytosis with total white count 78K, comprised of large plasma cells and his normocytic anemia. There is a myeloid left shift with previous surgical radium blasts. Flow cytometry showed 64% plasma cells    10/10/2014 Bone Marrow Biopsy    Markedly hypercellular marrow (95%), Atypical plasma cells comprise 57% of the cellularity. There was diminished multilineage in hematopoiesis with adequate maturation. Breasts less than 1%), no overt dysplasia of the myeloid or erythroid lineages.     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    05/07/2015 - 05/12/2015 Hospital Admission    [facility], [facility]. ID workup was  negative, EGD showed evidence of gastritis and duodenitis, no H. pylori or CMV.    05/20/2015 - 05/24/2015 Hospital Admission    [facility]    07/11/2015 Bone Marrow Biopsy    Post transplant 100 a bone marrow biopsy showed hypocellular marrow, 20%, no increase in plasma cells (2%) or other abnormalities.    08/22/2015 - 01/02/2016 Chemotherapy    MaintenanceCyborD (cytoxan 3031mm2 iv, bortezomib 1.5 mg/m, dexamethasone 40 mg, every 2 weeks, changed to Velcade maintenance after 4 months treatment    01/16/2016 - 04/19/2016 Chemotherapy    Maintenance Velcade 1.3 mg/m every 2 weeks    02/22/2016 Pathology Results    BONE MARROW: Diagnosis Bone Marrow, Aspirate,Biopsy, and Clot, right iliac - NORMOCELLULAR BONE MARROW FOR AGE WITH TRILINEAGE HEMATOPOIESIS. - PLASMACYTOSIS (PLASMA CELLS 12%). - SEE COMMENT. PERIPHERAL BLOOD: - OCCASIONAL CIRCULATING PLASMA CELLS.    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     04/18/2016 - 03/26/2017 Chemotherapy    Daratumumab per protocol  CyBorD every week, cytoxan was held after 04/24/2016 dye to cytopenia and infection  -discontinued due to Hep B flare    05/11/2016 - 05/16/2016 Hospital Admission    Healthcare-associated pneumonia    02/16/2017 - 02/21/2017 Hospital Admission    Admission diagnosis: Abnormal LFT's Additional comments: Assoc diagnoses: Hep B flare, transminitis, dehydration, fever, SIRS    04/06/2017 -  Chemotherapy    Carfilzomib 56 mg/m2 on days 1 and 15 (except 2061m2 on C1D1 and C1D2) with dexamethasone 20 mg on same days and pomalidomide 2 mg on days 1-21 of  28 day cycle        08/26/2017 - 08/28/2017 Hospital Admission    Admit date: 08/26/2017 Admission diagnosis: RLL Pneumonia      HISTORY OF PRESENTING ILLNESS:  Sheryl [name] 47 y.o. female is here because of recently diagnosed plasma cell leukemia. She was  recently discharged form [facility] 3 days ago and is here to establish her local oncological care with [place]. She is a [place], does not speak [place], she is accompanied to the clinic by her daughter and interpreter.  She presented to our hospital with worsening dyspnea, fatigue, cough, subjective fevers and chills, and was admitted on 09/14/2014. She was found to have allergy WBC 54.1K, hemoglobin 8.1, plt 310, she was treated with symptom management, and was discharged home on 09/17/2014, with a plan to follow-up with hematology. She represents to emergency room on 10/07/2014, was found to have white count of 78K, and worsening anemia with hemoglobin 6. She was seen by my partner Dr. Jonette Eva and plasma cell leukemia was suspected. She was transferred to [facility] for further leukemia work-up and treatment. Bone marrow biopsy was done, which confirmed plasma cell leukemia, and she started chemotherapy with Velcade, Cytoxan and dexamethasone. She received weekly dose twice, last dose on 10/20/2014. She was discharged to home afterwards. She had a mediport placed during her hospitalization.  She has moderate fatigue, low appetite, she lost about 13 lbs in the past few months. She has moderate abdominal pain, 5-6/10, persistent, she does not take any pain meds.   I reviewed her medical records extensively, and discussed her case with Dr. Jorje Guild and Agency program coordinator.  CURRENT THERAPY:   1. carfilzomib 56 mg/m2 on days 1 and 15(except 12m/m2 on C1D1 and C1D2)with dexamethasone 20 mg on same days and pomalidomide 2 mg on days 1-21 of 28 day cycleon 04/13/17, dexa stopped on 07/06/2017 due to her CR   2.  Zometa every 4 weeks, started on 11/2015, completed 2 years therapy on 12/02/2017  INTERIM HISTORY:   Sheryl [name] is here for follow up of her acute plasma cell Leukemia. She has seen NP Lacie in the interim when she was noted to be tolerating treatment well. Today, she is  here with her interpreter. She is doing well, but has been complaining of headaches that are 6/10, mainly frontal, 3 weeks duration, and associated with eye discomfort. She denies blurry vision or sleep difficulties. She denies nasal congestion. She can still perform daily activities, but she has not gone to work for 2 weeks now due to her need to take a break. She also has epigastric and RUQ superficial pain. She says that she feels like her skin burns. She denies worsening with food.     MEDICAL HISTORY:  Past Medical History:  Diagnosis Date  . Chills with fever    intermittently since d/c from hospital  . Dysuria-frequency syndrome    w/ pink urine  . GERD (gastroesophageal reflux disease)   . Hepatitis   . History of positive PPD    DX 2011--  CXR DONE NO EVIDENCE  . History of ureter stent   . Hydronephrosis, right   . Neuromuscular disorder (H[facility])    legs numb intermittently  . 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   . Pneumonia   . Right ureteral stone   . Urosepsis 8/14   admitted to [facility]    SURGICAL HISTORY: Past Surgical History:  Procedure Laterality Date  . CYSTOSCOPY W/ URETERAL STENT PLACEMENT Right  09/25/2012   Procedure: CYSTOSCOPY WITH RETROGRADE PYELOGRAM/URETERAL STENT PLACEMENT;  Surgeon: Alexis Frock, MD;  Location: [facility];  Service: Urology;  Laterality: Right;  . CYSTOSCOPY WITH RETROGRADE PYELOGRAM, URETEROSCOPY AND STENT PLACEMENT Right 10/15/2012   Procedure: CYSTOSCOPY WITH RETROGRADE PYELOGRAM, URETEROSCOPY AND REMOVAL STENT WITH  STENT PLACEMENT;  Surgeon: Alexis Frock, MD;  Location: [facility];  Service: Urology;  Laterality: Right;  . ESOPHAGOGASTRODUODENOSCOPY (EGD) WITH PROPOFOL N/A 11/16/2014   Procedure: ESOPHAGOGASTRODUODENOSCOPY (EGD) WITH PROPOFOL;  Surgeon: Milus Banister, MD;  Location: [facility];  Service: Endoscopy;  Laterality: N/A;  . HOLMIUM LASER APPLICATION Right 4/76/5465   Procedure: HOLMIUM LASER APPLICATION;  Surgeon:  Alexis Frock, MD;  Location: [facility];  Service: Urology;  Laterality: Right;  . LIVER BIOPSY    . OTHER SURGICAL HISTORY Right    removal of ovarian cyst  . removal of uterine cyst     years ago  . RIGHT VATS W/ DRAINAGE PEURAL EFFUSION AND BX'S  10-30-2008    SOCIAL HISTORY: Social History   Socioeconomic History  . Marital status: Single    Spouse name: Not on file  . Number of children: 3  . Years of education: Not on file  . Highest education level: Not on file  Occupational History  . Not on file  Social Needs  . Financial resource strain: Not on file  . Food insecurity:    Worry: Not on file    Inability: Not on file  . Transportation needs:    Medical: Not on file    Non-medical: Not on file  Tobacco Use  . Smoking status: Never Smoker  . Smokeless tobacco: Never Used  Substance and Sexual Activity  . Alcohol use: No    Alcohol/week: 0.0 standard drinks  . Drug use: No  . Sexual activity: Not Currently    Birth control/protection: Abstinence  Lifestyle  . Physical activity:    Days per week: Not on file    Minutes per session: Not on file  . Stress: Not on file  Relationships  . Social connections:    Talks on phone: Not on file    Gets together: Not on file    Attends religious service: Not on file    Active member of club or organization: Not on file    Attends meetings of clubs or organizations: Not on file    Relationship status: Not on file  Other Topics Concern  . Not on file  Social History Narrative  . Not on file    FAMILY HISTORY: Family History  Problem Relation Age of Onset  . Stomach cancer Mother   . Lung disease Father   . Asthma Father     ALLERGIES:  has No Known Allergies.  MEDICATIONS:  Current Outpatient Medications  Medication Sig Dispense Refill  . acyclovir (ZOVIRAX) 800 MG tablet Take 1 tablet (800 mg total) by mouth 2 (two) times daily. 60 tablet 5  . feeding supplement, ENSURE ENLIVE, (ENSURE  ENLIVE) LIQD Take 237 mLs by mouth 2 (two) times daily between meals. 237 mL 12  . ibuprofen (ADVIL,MOTRIN) 200 MG tablet Take 200 mg by mouth every 6 (six) hours as needed for moderate pain.    . chlorpheniramine-HYDROcodone (TUSSIONEX) 10-8 MG/5ML SUER Take 5 mLs by mouth every 12 (twelve) hours as needed for cough. (Patient not taking: Reported on 12/16/2017) 140 mL 0  . magic mouthwash SOLN Take 5 mLs by mouth 4 (four) times daily as needed  for mouth pain. (Patient not taking: Reported on 12/16/2017) 240 mL 3  . omeprazole (PRILOSEC) 20 MG capsule Take 1 capsule (20 mg total) by mouth daily. 30 capsule 5  . ondansetron (ZOFRAN ODT) 8 MG disintegrating tablet Take 1 tablet (8 mg total) by mouth every 8 (eight) hours as needed for nausea or vomiting. 40 tablet 1  . polyethylene glycol (MIRALAX / GLYCOLAX) packet Take 17 g by mouth daily as needed (constipation). 14 each 1  . pomalidomide (POMALYST) 2 MG capsule Take 1 capsule (2 mg total) by mouth daily. Take with water on days 1-21. Repeat every 28 days. 21 capsule 0  . potassium chloride SA (K-DUR,KLOR-CON) 20 MEQ tablet Take 1 tablet (20 mEq total) by mouth daily. 30 tablet 0  . prochlorperazine (COMPAZINE) 10 MG tablet Take 1 tablet (10 mg total) by mouth every 6 (six) hours as needed for nausea or vomiting. 30 tablet 3  . promethazine (PHENERGAN) 25 MG tablet Take 1 tablet (25 mg total) by mouth every 6 (six) hours as needed for nausea or vomiting. 30 tablet 0  . VIREAD 300 MG tablet Take 1 tablet (300 mg total) by mouth daily. 30 tablet 11   No current facility-administered medications for this visit.    Facility-Administered Medications Ordered in Other Visits  Medication Dose Route Frequency Provider Last Rate Last Dose  . sodium chloride 0.9 % injection 10 mL  10 mL Intravenous PRN Truitt Merle, MD   10 mL at 12/11/15 1532  . sodium chloride 0.9 % injection 10 mL  10 mL Intravenous PRN Truitt Merle, MD   10 mL at 06/11/16 1505  . sodium  chloride 0.9 % injection 10 mL  10 mL Intravenous PRN Truitt Merle, MD   10 mL at 08/04/17 1643  . sodium chloride flush (NS) 0.9 % injection 10 mL  10 mL Intravenous PRN Truitt Merle, MD   10 mL at 10/09/15 1717    REVIEW OF SYSTEMS:   Constitutional: no abnormal night sweats (+) headaches Eyes: Denies blurriness of vision, double vision or watery eyes.  Ears, nose, mouth, throat, and face: Denies mucositis  Respiratory: Denies, dyspnea or wheezes  Cardiovascular: Denies palpitation, chest discomfort or lower extremity swelling Gastrointestinal:  Denies nausea, heartburn, reports regular bowel/bladder (+) epigastric/ RUQ abdominal pain GU: WNLs Skin: Denies abnormal skin rashes Musculoskeletal: No new joint pain  Lymphatics: Denies new lymphadenopathy or easy bruising Neurological:Denies numbness, tingling or new weaknesses Behavioral/Psych: Mood is stable, no new changes  All other systems were reviewed with the patient and are negative.  PHYSICAL EXAMINATION:   ECOG PERFORMANCE STATUS: 1-2  Vitals:   12/16/17 1131  BP: 138/84  Pulse: 71  Resp: 18  Temp: 97.7 F (36.5 C)  TempSrc: Oral  SpO2: 100%  Weight: 133 lb 9.6 oz (60.6 kg)  Height: '5\' 2"'  (1.575 m)    GENERAL: She was wrapped with multiple blankets, not in respiratory distress. SKIN: skin color, texture, turgor are normal, no rashes or significant lesions EYES: normal, conjunctiva are pink and non-injected OROPHARYNX:no exudate, no erythema and lips, buccal mucosa NECK: supple, thyroid normal size, non-tender, without nodularity LYMPH:  no palpable lymphadenopathy in the cervical, axillary or inguinal LUNGS: No added sounds or dullness on percussion  HEART: regular rate & rhythm and no murmurs and no lower extremity edema ABDOMEN:abdomen soft, moderate tenderness in the right upper quadrant of abdomen, no rebound pain, no Murphy sign, no hepatomegaly. Musculoskeletal:no cyanosis of digits and no clubbing  PSYCH:  alert &  oriented x 3 with fluent speech NEURO: no focal motor/sensory deficits  LABORATORY DATA:  I have reviewed the data as listed CBC Latest Ref Rng & Units 12/16/2017 12/02/2017 11/18/2017  WBC 4.0 - 10.5 K/uL 3.8(L) 4.5 2.7(L)  Hemoglobin 12.0 - 15.0 g/dL 10.5(L) 10.7(L) 10.6(L)  Hematocrit 36.0 - 46.0 % 33.5(L) 33.9(L) 34.0(L)  Platelets 150 - 400 K/uL 166 180 220    CMP Latest Ref Rng & Units 12/16/2017 12/02/2017 11/18/2017  Glucose 70 - 99 mg/dL 100(H) 104(H) 98  BUN 6 - 20 mg/dL '12 16 12  ' Creatinine 0.44 - 1.00 mg/dL 0.78 0.77 0.69  Sodium 135 - 145 mmol/L 141 141 143  Potassium 3.5 - 5.1 mmol/L 3.7 3.6 3.4(L)  Chloride 98 - 111 mmol/L 108 105 109  CO2 22 - 32 mmol/L '27 29 27  ' Calcium 8.9 - 10.3 mg/dL 9.7 9.7 9.1  Total Protein 6.5 - 8.1 g/dL 7.2 7.1 6.7  Total Bilirubin 0.3 - 1.2 mg/dL 0.3 0.3 0.3  Alkaline Phos 38 - 126 U/L 79 103 80  AST 15 - 41 U/L '21 26 24  ' ALT 0 - 44 U/L <'6 8 7    ' SPEP M-protein  09/15/2014: 4.2 10/08/14: 4.6 12/08/2014: 0.2 02/15/2015: not sufficient sample for test   09/11/2015: not observed 10/09/2015: not det   11/06/2015: not det  12/25/2015: not det  02/13/2016: 0.3 03/12/2016: 0.8 04/09/2016: 0.6 05/08/16: 0.1 06/04/16: 0.1 07/02/2016: 0.2 07/09/2016: 0.2 (dara specific IFE negative) 08/05/16: Not observed  09/01/16: Not observed 09/29/16: 0.1(dara specific IFE negative) 10/27/16: Not observed 11/24/16 : Not observed 12/23/16: 0.1 (Dara specific IFE showed IgG monoclonal protein with lambda light chain) 01/21/2017: 0.1 (Dara specific IFE showed IgG monoclonal protein with lambda light chain) 03/26/17: 0.2 04/27/17: 0.1 05/25/17: Not observed 06/23/17: Not observed 08/31/17: 0.5 09/29/2017; not observed  10/22/17: Not Observed (Dara specific IFE negative) 11/04/17: Not observed 11/18/17: Not observed 12/02/17: Not observed   IgG mg/dl  11/03/2014: 3150  12/08/2014:  759 02/14/2014: 860 09/11/2015: 1347 10/09/2015: 1457 11/06/2015: 1504 12/25/2015:  1400 02/13/2016: 1543 03/12/2016: 1860 04/09/2016: 1620 05/08/16: 749 06/04/16: 751 5/30/20187: 796 07/09/2016: 701 08/05/16: 678 09/01/16: 650 09/29/16: 727 10/27/16: 634 11/24/16: 685  12/23/16: 714 01/21/2017: 709 03/26/17: 874 04/27/17: 1067 05/25/17: 1161 06/23/17: 1211 08/17/17: 1336 08/31/17: 1438 10/22/17: 1,390 11/04/17: 1,328 11/18/17: 1,083 12/02/17: 1,131  Kappa/lambda light chains levels and ration  11/03/14: 0.75, 152, 0.00 12/22/2014: 1.10, 2.60, 0.42 02/15/2015: 9.59,14.35, 0.67 09/11/2015: 2.44, 2.72, 0.90 10/09/2015: 3.09, 3.49, 0.89 11/06/2015: 2.30, 2.62, 0.88 12/25/2015: 2.51, 3.0, 0.84 02/13/2016: 2.64, 15.3, 0.17 03/12/2016: 2.27, 45.5, 0.05 04/09/2016: 3.33, 45.5, 0.07 05/08/2016: 0.49, 1.21, 0.40 06/04/2016: 0.83, 1.11, 0.75 07/02/16: 0.51, 1.43, 0.36 07/23/16: 0.68, 1.02, 0.67 08/05/16: 0.62, 1.09, 0.57 09/01/16: 0.80, 1.21, 0.66 09/29/16: 0.44, 1.07, 0.41 10/27/16: 0.63, 1.44, 0.44 11/24/16: 0.83, 2.13, 0.39 12/23/16: 0.69, 2.31, 0.30 01/21/2017: 0.77, 3.08, 0.25   24 h urine UPEP/IFE and light chain: 11/03/2014: IFE showed a monoclonal IgG heavy chain with associated lambda light chain. M protein was Undetectable    PATHOLOGY   01/21/2017 Cytogenetics  Normal  PATHOLOGY REPORT  Bone Marrow (BM) and Peripheral Blood (PB) FINAL PATHOLOGIC DIAGNOSIS  BONE MARROW: 07/11/2015 Hypocellular marrow (20%) with no increase in plasma cells (2%). See comment. PERIPHERAL BLOOD: Mild anemia. No circulating plasma cells identified. See comment and CBC data.  BONE MARROW: 02/22/2016 Diagnosis Bone Marrow, Aspirate,Biopsy, and Clot, right iliac - NORMOCELLULAR BONE MARROW FOR AGE WITH TRILINEAGE HEMATOPOIESIS. - PLASMACYTOSIS (PLASMA CELLS  12%). - SEE COMMENT. PERIPHERAL BLOOD: - OCCASIONAL CIRCULATING PLASMA CELLS.   PROCEDURES  Bone Marrow Biopsy, 01/21/17 Bone Marrow, Aspirate, Biopsy, and Clot - NORMOCELLULAR BONE MARROW FOR AGE WITH TRILINEAGE HEMATOPOIESIS AND 2%  PLASMA CELLS. PERIPHERAL BLOOD: - NORMOCYTIC-HYPOCHROMIC ANEMIA.   Colonoscopy by Dr. Ardis Hughs on 11/26/16  IMPRESSION - One 2 mm polyp in the transverse colon, removed with a cold biopsy forceps. Resected and retrieved. - Small internal and external hemorrhoids. - The examination was otherwise normal on direct and retroflexion views.   RADIOGRAPHIC STUDIES: I have personally reviewed the radiological images as listed and agreed with the findings in the report. No results found.   09/29/2017 CXR IMPRESSION: Persistent infiltrate in the RIGHT middle lobe. Followup PA and lateral chest X-ray is recommended in 3-4 weeks following trial of antibiotic therapy to ensure resolution and exclude underlying malignancy.  09/22/2017 CXR IMPRESSION: Mild infiltrate the lateral portion of the right lung base noted. Findings most likely secondary to pneumonia. Pulmonary infarct cannot be excluded. Previously identified medial right base infiltrate has largely cleared with mild residual subsegmental atelectasis. Small right pleural effusion.   CXR 05/08/2016 IMPRESSION:  No acute disease.  CT Biopsy 02/22/16 IMPRESSION: 1. Technically successful CT guided right iliac bone core and aspiration biopsy.  ASSESSMENT & PLAN:   47 y.o. [place] woman, presented with anemia and leokocytosis    1.  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 Unfortunately this showed increased plasma cells 12% with additional lambda light chain staining pending. This is relapse of disease. Cytogenetics was normal  -Her M protein and lambda free light chain level has significantly increased in 02/2016, consistent with disease  relapse. Her LP showed negative CSF, no evidence of CNS involvement. -I have previously spoken with Dr. Norma Fredrickson, we agreed to change her CyBorD to weekly, and add Daratumumab weekly, starting 04/18/16.   -He completed Zometa every 4 weeks for a total of 2 years in 11/2017 -Due to her hospitalization for severe cytopenia and infection, Cytoxan has been held. -She restarted weekly Velcade and dexa, and on Dara every 4 weeks, tolerating well, will continue. Her dara-specific IFE was negative for M-protein on 07/07/16 at [facility], she has achieved second CR in 07/2016.  -Her SPEP and IFE detected a small amount M-protein again, the Dara specific immunofixation was positive, this is concerning for recurrence.  We will continue monitor her SPEP and immunofixation every months -Bone Marrow Biopsy on 01/21/2017 showed 2% of plasma cells, monoclonal, no definitive evidence of plasma neoplasm.  It confirmed complete response. --Due to her severe hep B flare, her treatment has been held since 02/02/2017. Dr. Norma Fredrickson was informed. Her hepatitis B flare is likely related to daratumumab. -She was seen by Dr. Norma Fredrickson, she does not have a matched bone marrow, her transplant has been held temporarily.  Dr. Norma Fredrickson recommends switching her treatment to carfilzomib 56 mg/m2 on days 1 and 15 with dexamethasone 20 mg on same days and pomalidomide 2 mg on days 1-21 of 28 day cycle. She has started on 04/13/2017  -Due to complete remission, I recommended to stop dexamethasone, and continue Pomalyst and carfilzomib as maintenance therapy indefinitely. -Her M protein from 08/31/17 was 0.5.  This is concerning for disease recurrence.  It became non-detectable again on subsequent labs. She remains to be in remission.  -She has been tolerating treatment moderate well, with moderate nausea several days after infusion, occasional vomiting and generalized body aches.  She still able to tolerate routine activities, works 2 to 3 days a  week. -She follows up with her PCP and her vaccines are up to date. -Labs reviewed and dicussed with patient. CBC showed Hg 10.5 WBC 3.8K  Platelets 166K. CMP is pending. SPEP pending.  -Due to her recent new onset of headaches, I will order a brain MRI to rule out Kyprolis induced posterior reversible encephalopathy syndrome.  -continue Pomalyst -f/u with in 2 weeks  2. Transaminitis and hyperbilirubinemia, secondary to hepatitis B flare -She has developed significant transaminitis and hyperbilirubinemia since January 2019 -She was previously on hepatitis B treatment entecavir by her GI at [facility], however she was off treatment for 2-3 months in late 2018, due to her issues with refill.  She did refill and restarted at the end of December 2018, her repeated hepatitis B titer was very high in Jan 2019. -it's resolved now, her total bilirubin, liver enzymes are back to normal now. She will continue to be closely followed by ID Dr. Baxter Flattery. She was last seen by Dr. Baxter Flattery in 04/2017, I encouraged her to follow-up. I will send a message to Dr. Baxter Flattery also   3. Type 2 diabetes, steroids induced  -She was noticed to have increased blood glucose level lately, with random blood glucose above 200. She also developed diabetic symptoms. No history of diabetes in the past. -This is likely steroids induced hyperglycemia -she was on metformin when she was on dexa -Her BG is better controlled with metformin.  Her last hemoglobin A1c was 5% on May 25, 2017 -She has been off dexamethasone, not on metformin now.  Her random sugar has been normal.  4. GERD, history of GI bleeding and nausea  -Continue omeprazole daily. We previously discussed steroids can worsen her acid reflux. -She previously had an EGD at [facility] in 2017. This was benign.  -she did not do her colonoscopy because she did not have anyone to accompany her during visit. She has recently seen Dr. Ardis Hughs again, and is scheduled for  colonoscopy. -Colonoscopy on 11/26/16 per Dr. Ardis Hughs notable for a polyp in the transverse colon, revealed to be tubular adenoma and negative for high grade dysplasia or malignancy.   5. Constipation - Her last BM was this morning - She uses Miralax twice daily. She also uses Colace - I recommend her to try Senna - I will monitor  6. Body pain -She has been having mild generalized body, especially in her thighs, since she started chemo.  It seems to be worse when she switched to current regiment.  -Overall manageable, she is not on pain medication.  7. New onset headache  -She reports new onset headaches for the past 3 weeks, moderate and persistent, no other new neurological symptoms. -I will get a brain MRI wo contrast to rule out Kyprolis induced posterior reversible encephalopathy syndrome.   Plan  -Lab reviewed, adequate for treatment -Follow-up in 2 weeks -Brain MRI wo contrast before next visit -I refilled omeprazole    All questions were answered. The patient knows to call the clinic with any problems, questions or concerns.  I spent 25 minutes counseling the patient face to face.  The total time spent in the appointment was 30 minutes and more than 50% was on counseling.  Dierdre Searles Dweik am acting as scribe for Dr. Truitt Merle.  I have reviewed the above documentation for accuracy and completeness, and I agree with the above.    Truitt Merle, MD  12/16/2017

## 2017-12-16 ENCOUNTER — Inpatient Hospital Stay: Payer: Medicaid Other

## 2017-12-16 ENCOUNTER — Inpatient Hospital Stay (HOSPITAL_BASED_OUTPATIENT_CLINIC_OR_DEPARTMENT_OTHER): Payer: Medicaid Other | Admitting: Hematology

## 2017-12-16 ENCOUNTER — Encounter: Payer: Self-pay | Admitting: Hematology

## 2017-12-16 ENCOUNTER — Inpatient Hospital Stay: Payer: Medicaid Other | Attending: Hematology

## 2017-12-16 VITALS — BP 138/84 | HR 71 | Temp 97.7°F | Resp 18 | Ht 62.0 in | Wt 133.6 lb

## 2017-12-16 DIAGNOSIS — E099 Drug or chemical induced diabetes mellitus without complications: Secondary | ICD-10-CM

## 2017-12-16 DIAGNOSIS — M79652 Pain in left thigh: Secondary | ICD-10-CM

## 2017-12-16 DIAGNOSIS — Z95828 Presence of other vascular implants and grafts: Secondary | ICD-10-CM

## 2017-12-16 DIAGNOSIS — R51 Headache: Secondary | ICD-10-CM

## 2017-12-16 DIAGNOSIS — M79651 Pain in right thigh: Secondary | ICD-10-CM

## 2017-12-16 DIAGNOSIS — C9012 Plasma cell leukemia in relapse: Secondary | ICD-10-CM | POA: Diagnosis present

## 2017-12-16 DIAGNOSIS — R519 Headache, unspecified: Secondary | ICD-10-CM

## 2017-12-16 DIAGNOSIS — K5900 Constipation, unspecified: Secondary | ICD-10-CM

## 2017-12-16 DIAGNOSIS — R110 Nausea: Secondary | ICD-10-CM

## 2017-12-16 DIAGNOSIS — B181 Chronic viral hepatitis B without delta-agent: Secondary | ICD-10-CM

## 2017-12-16 DIAGNOSIS — R740 Nonspecific elevation of levels of transaminase and lactic acid dehydrogenase [LDH]: Secondary | ICD-10-CM

## 2017-12-16 DIAGNOSIS — C9010 Plasma cell leukemia not having achieved remission: Secondary | ICD-10-CM

## 2017-12-16 DIAGNOSIS — B1910 Unspecified viral hepatitis B without hepatic coma: Secondary | ICD-10-CM

## 2017-12-16 DIAGNOSIS — Z5112 Encounter for antineoplastic immunotherapy: Secondary | ICD-10-CM | POA: Insufficient documentation

## 2017-12-16 LAB — CMP (CANCER CENTER ONLY)
ALBUMIN: 3.3 g/dL — AB (ref 3.5–5.0)
ALT: <6 U/L (ref 0–44)
ANION GAP: 6 (ref 5–15)
AST: 21 U/L (ref 15–41)
Alkaline Phosphatase: 79 U/L (ref 38–126)
BILIRUBIN TOTAL: 0.3 mg/dL (ref 0.3–1.2)
BUN: 12 mg/dL (ref 6–20)
CHLORIDE: 108 mmol/L (ref 98–111)
CO2: 27 mmol/L (ref 22–32)
Calcium: 9.7 mg/dL (ref 8.9–10.3)
Creatinine: 0.78 mg/dL (ref 0.44–1.00)
GFR, Est AFR Am: >60 mL/min (ref >60)
Glucose, Bld: 100 mg/dL — ABNORMAL HIGH (ref 70–99)
POTASSIUM: 3.7 mmol/L (ref 3.5–5.1)
Sodium: 141 mmol/L (ref 135–145)
TOTAL PROTEIN: 7.2 g/dL (ref 6.5–8.1)

## 2017-12-16 LAB — CBC WITH DIFFERENTIAL (CANCER CENTER ONLY)
Abs Immature Granulocytes: 0.02 10*3/uL (ref 0.00–0.07)
BASOS ABS: 0 10*3/uL (ref 0.0–0.1)
BASOS PCT: 1 %
EOS ABS: 0.1 10*3/uL (ref 0.0–0.5)
EOS PCT: 2 %
HCT: 33.5 % — ABNORMAL LOW (ref 36.0–46.0)
Hemoglobin: 10.5 g/dL — ABNORMAL LOW (ref 12.0–15.0)
IMMATURE GRANULOCYTES: 1 %
LYMPHS ABS: 0.8 10*3/uL (ref 0.7–4.0)
Lymphocytes Relative: 22 %
MCH: 30.1 pg (ref 26.0–34.0)
MCHC: 31.3 g/dL (ref 30.0–36.0)
MCV: 96 fL (ref 80.0–100.0)
MONOS PCT: 13 %
Monocytes Absolute: 0.5 10*3/uL (ref 0.1–1.0)
NRBC: 0 % (ref 0.0–0.2)
Neutro Abs: 2.4 10*3/uL (ref 1.7–7.7)
Neutrophils Relative %: 61 %
PLATELETS: 166 10*3/uL (ref 150–400)
RBC: 3.49 MIL/uL — ABNORMAL LOW (ref 3.87–5.11)
RDW: 13.2 % (ref 11.5–15.5)
WBC Count: 3.8 10*3/uL — ABNORMAL LOW (ref 4.0–10.5)

## 2017-12-16 MED ORDER — SODIUM CHLORIDE 0.9 % IV SOLN: 8.0000 mg | Freq: Once | INTRAVENOUS | Status: DC

## 2017-12-16 MED ORDER — PALONOSETRON HCL INJECTION 0.25 MG/5ML
0.2500 mg | Freq: Once | INTRAVENOUS | Status: AC
  Administered 2017-12-16: 0.25 mg via INTRAVENOUS

## 2017-12-16 MED ORDER — SODIUM CHLORIDE 0.9% FLUSH
10.0000 mL | INTRAVENOUS | Status: DC | PRN
  Administered 2017-12-16: 10 mL via INTRAVENOUS
  Filled 2017-12-16: qty 10

## 2017-12-16 MED ORDER — SODIUM CHLORIDE 0.9% FLUSH
10.0000 mL | INTRAVENOUS | Status: DC | PRN
  Administered 2017-12-16: 10 mL
  Filled 2017-12-16: qty 10

## 2017-12-16 MED ORDER — SODIUM CHLORIDE 0.9 % IV SOLN
Freq: Once | INTRAVENOUS | Status: AC
  Administered 2017-12-16: 13:00:00 via INTRAVENOUS
  Filled 2017-12-16: qty 250

## 2017-12-16 MED ORDER — HEPARIN SOD (PORK) LOCK FLUSH 100 UNIT/ML IV SOLN
500.0000 [IU] | Freq: Once | INTRAVENOUS | Status: AC | PRN
  Administered 2017-12-16: 500 [IU]
  Filled 2017-12-16: qty 5

## 2017-12-16 MED ORDER — DEXAMETHASONE SODIUM PHOSPHATE 10 MG/ML IJ SOLN
INTRAMUSCULAR | Status: AC
  Filled 2017-12-16: qty 1

## 2017-12-16 MED ORDER — PALONOSETRON HCL INJECTION 0.25 MG/5ML
INTRAVENOUS | Status: AC
  Filled 2017-12-16: qty 5

## 2017-12-16 MED ORDER — DEXTROSE 5 % IV SOLN
56.0000 mg/m2 | Freq: Once | INTRAVENOUS | Status: AC
  Administered 2017-12-16: 90 mg via INTRAVENOUS
  Filled 2017-12-16: qty 15

## 2017-12-16 MED ORDER — PROCHLORPERAZINE MALEATE 10 MG PO TABS
10.0000 mg | ORAL_TABLET | Freq: Once | ORAL | Status: AC
  Administered 2017-12-16: 10 mg via ORAL

## 2017-12-16 MED ORDER — PROCHLORPERAZINE MALEATE 10 MG PO TABS
ORAL_TABLET | ORAL | Status: AC
  Filled 2017-12-16: qty 1

## 2017-12-16 MED ORDER — DEXAMETHASONE SODIUM PHOSPHATE 10 MG/ML IJ SOLN
8.0000 mg | Freq: Once | INTRAMUSCULAR | Status: AC
  Administered 2017-12-16: 8 mg via INTRAVENOUS

## 2017-12-16 MED ORDER — OMEPRAZOLE 20 MG PO CPDR: 20.0000 mg | DELAYED_RELEASE_CAPSULE | Freq: Every day | ORAL | 5 refills | Status: DC

## 2017-12-16 MED FILL — OMEPRAZOLE 20 MG CPDR: 20 | 30 days supply | Qty: 30 | Fill #1

## 2017-12-16 NOTE — Patient Instructions (Signed)
[facility] Discharge Instructions for Patients Receiving Chemotherapy  Today you received the following chemotherapy agents:  Kyprolis  To help prevent nausea and vomiting after your treatment, we encourage you to take your nausea medication as directed.   If you develop nausea and vomiting that is not controlled by your nausea medication, call the clinic.   BELOW ARE SYMPTOMS THAT SHOULD BE REPORTED IMMEDIATELY:  *FEVER GREATER THAN 100.5 F  *CHILLS WITH OR WITHOUT FEVER  NAUSEA AND VOMITING THAT IS NOT CONTROLLED WITH YOUR NAUSEA MEDICATION  *UNUSUAL SHORTNESS OF BREATH  *UNUSUAL BRUISING OR BLEEDING  TENDERNESS IN MOUTH AND THROAT WITH OR WITHOUT PRESENCE OF ULCERS  *URINARY PROBLEMS  *BOWEL PROBLEMS  UNUSUAL RASH Items with * indicate a potential emergency and should be followed up as soon as possible.  Feel free to call the clinic should you have any questions or concerns. The clinic phone number is [phone].  Please show the CHEMO ALERT CARD at check-in to the Emergency Department and triage nurse.   

## 2017-12-17 LAB — IFE, DARA-SPECIFIC, SERUM
IGG (IMMUNOGLOBIN G), SERUM: 1314 mg/dL (ref 700–1600)
IgA: 255 mg/dL (ref 87–352)
IgM (Immunoglobulin M), Srm: 41 mg/dL (ref 26–217)

## 2017-12-17 LAB — PROTEIN ELECTROPHORESIS, SERUM
A/G Ratio: 1 (ref 0.7–1.7)
ALBUMIN ELP: 3.4 g/dL (ref 2.9–4.4)
ALPHA-1-GLOBULIN: 0.3 g/dL (ref 0.0–0.4)
ALPHA-2-GLOBULIN: 0.9 g/dL (ref 0.4–1.0)
BETA GLOBULIN: 0.9 g/dL (ref 0.7–1.3)
GAMMA GLOBULIN: 1.3 g/dL (ref 0.4–1.8)
Globulin, Total: 3.4 g/dL (ref 2.2–3.9)
Total Protein ELP: 6.8 g/dL (ref 6.0–8.5)

## 2017-12-18 ENCOUNTER — Telehealth: Payer: Self-pay | Admitting: Hematology

## 2017-12-18 NOTE — Telephone Encounter (Signed)
Added f/u 11/27 per 11/13 los. Left message for patient via [facility] [place] #[phone]. Central radiology will call re scan. Schedule mailed.

## 2017-12-28 ENCOUNTER — Ambulatory Visit (HOSPITAL_COMMUNITY)
Admission: RE | Admit: 2017-12-28 | Discharge: 2017-12-28 | Disposition: A | Discharge status: Home / Self Care | Payer: Medicaid Other | Source: Ambulatory Visit | Attending: Hematology | Admitting: Hematology

## 2017-12-28 DIAGNOSIS — R51 Headache: Secondary | ICD-10-CM | POA: Diagnosis not present

## 2017-12-28 DIAGNOSIS — R590 Localized enlarged lymph nodes: Secondary | ICD-10-CM | POA: Insufficient documentation

## 2017-12-28 DIAGNOSIS — Z856 Personal history of leukemia: Secondary | ICD-10-CM | POA: Diagnosis not present

## 2017-12-28 DIAGNOSIS — R519 Headache, unspecified: Secondary | ICD-10-CM

## 2017-12-30 ENCOUNTER — Inpatient Hospital Stay (HOSPITAL_BASED_OUTPATIENT_CLINIC_OR_DEPARTMENT_OTHER): Payer: Medicaid Other | Admitting: Nurse Practitioner

## 2017-12-30 ENCOUNTER — Telehealth: Payer: Self-pay | Admitting: Pharmacist

## 2017-12-30 ENCOUNTER — Other Ambulatory Visit: Payer: Self-pay

## 2017-12-30 ENCOUNTER — Inpatient Hospital Stay: Payer: Medicaid Other

## 2017-12-30 ENCOUNTER — Encounter: Payer: Self-pay | Admitting: Nurse Practitioner

## 2017-12-30 VITALS — BP 118/81 | HR 72 | Temp 98.0°F | Resp 17 | Ht 62.0 in | Wt 134.3 lb

## 2017-12-30 DIAGNOSIS — Z95828 Presence of other vascular implants and grafts: Secondary | ICD-10-CM

## 2017-12-30 DIAGNOSIS — C9010 Plasma cell leukemia not having achieved remission: Secondary | ICD-10-CM

## 2017-12-30 DIAGNOSIS — C9012 Plasma cell leukemia in relapse: Secondary | ICD-10-CM

## 2017-12-30 DIAGNOSIS — C9011 Plasma cell leukemia in remission: Secondary | ICD-10-CM

## 2017-12-30 DIAGNOSIS — R51 Headache: Secondary | ICD-10-CM

## 2017-12-30 DIAGNOSIS — Z5112 Encounter for antineoplastic immunotherapy: Secondary | ICD-10-CM | POA: Diagnosis not present

## 2017-12-30 DIAGNOSIS — G8929 Other chronic pain: Secondary | ICD-10-CM

## 2017-12-30 DIAGNOSIS — K219 Gastro-esophageal reflux disease without esophagitis: Secondary | ICD-10-CM

## 2017-12-30 DIAGNOSIS — K5900 Constipation, unspecified: Secondary | ICD-10-CM

## 2017-12-30 DIAGNOSIS — E876 Hypokalemia: Secondary | ICD-10-CM

## 2017-12-30 DIAGNOSIS — E099 Drug or chemical induced diabetes mellitus without complications: Secondary | ICD-10-CM

## 2017-12-30 LAB — PREGNANCY, URINE: PREG TEST UR: NEGATIVE

## 2017-12-30 LAB — CMP (CANCER CENTER ONLY)
ALT: 7 U/L (ref 0–44)
AST: 22 U/L (ref 15–41)
Albumin: 3.4 g/dL — ABNORMAL LOW (ref 3.5–5.0)
Alkaline Phosphatase: 93 U/L (ref 38–126)
Anion gap: 8 (ref 5–15)
BILIRUBIN TOTAL: 0.2 mg/dL — AB (ref 0.3–1.2)
BUN: 16 mg/dL (ref 6–20)
CO2: 26 mmol/L (ref 22–32)
Calcium: 9.4 mg/dL (ref 8.9–10.3)
Chloride: 109 mmol/L (ref 98–111)
Creatinine: 0.68 mg/dL (ref 0.44–1.00)
Glucose, Bld: 72 mg/dL (ref 70–99)
POTASSIUM: 3.7 mmol/L (ref 3.5–5.1)
Sodium: 143 mmol/L (ref 135–145)
TOTAL PROTEIN: 7 g/dL (ref 6.5–8.1)

## 2017-12-30 LAB — CBC WITH DIFFERENTIAL (CANCER CENTER ONLY)
ABS IMMATURE GRANULOCYTES: 0.03 10*3/uL (ref 0.00–0.07)
BASOS PCT: 1 %
Basophils Absolute: 0 10*3/uL (ref 0.0–0.1)
Eosinophils Absolute: 0 10*3/uL (ref 0.0–0.5)
Eosinophils Relative: 1 %
HCT: 33.7 % — ABNORMAL LOW (ref 36.0–46.0)
Hemoglobin: 10.6 g/dL — ABNORMAL LOW (ref 12.0–15.0)
Immature Granulocytes: 1 %
Lymphocytes Relative: 26 %
Lymphs Abs: 1.2 10*3/uL (ref 0.7–4.0)
MCH: 30.5 pg (ref 26.0–34.0)
MCHC: 31.5 g/dL (ref 30.0–36.0)
MCV: 96.8 fL (ref 80.0–100.0)
MONO ABS: 0.4 10*3/uL (ref 0.1–1.0)
Monocytes Relative: 8 %
NEUTROS ABS: 2.9 10*3/uL (ref 1.7–7.7)
Neutrophils Relative %: 63 %
PLATELETS: 261 10*3/uL (ref 150–400)
RBC: 3.48 MIL/uL — AB (ref 3.87–5.11)
RDW: 13.7 % (ref 11.5–15.5)
WBC: 4.5 10*3/uL (ref 4.0–10.5)
nRBC: 0 % (ref 0.0–0.2)

## 2017-12-30 MED ORDER — POMALIDOMIDE 2 MG PO CAPS: 2.0000 mg | ORAL_CAPSULE | Freq: Every day | ORAL | 0 refills | Status: DC

## 2017-12-30 MED ORDER — PALONOSETRON HCL INJECTION 0.25 MG/5ML
INTRAVENOUS | Status: AC
  Filled 2017-12-30: qty 5

## 2017-12-30 MED ORDER — PALONOSETRON HCL INJECTION 0.25 MG/5ML
0.2500 mg | Freq: Once | INTRAVENOUS | Status: AC
  Administered 2017-12-30: 0.25 mg via INTRAVENOUS

## 2017-12-30 MED ORDER — PROCHLORPERAZINE MALEATE 10 MG PO TABS
10.0000 mg | ORAL_TABLET | Freq: Once | ORAL | Status: AC
  Administered 2017-12-30: 10 mg via ORAL

## 2017-12-30 MED ORDER — DEXAMETHASONE SODIUM PHOSPHATE 10 MG/ML IJ SOLN
8.0000 mg | Freq: Once | INTRAMUSCULAR | Status: AC
  Administered 2017-12-30: 8 mg via INTRAVENOUS

## 2017-12-30 MED ORDER — SODIUM CHLORIDE 0.9 % IV SOLN
Freq: Once | INTRAVENOUS | Status: AC
  Administered 2017-12-30: 11:00:00 via INTRAVENOUS
  Filled 2017-12-30: qty 250

## 2017-12-30 MED ORDER — DEXAMETHASONE SODIUM PHOSPHATE 10 MG/ML IJ SOLN
INTRAMUSCULAR | Status: AC
  Filled 2017-12-30: qty 1

## 2017-12-30 MED ORDER — DEXTROSE 5 % IV SOLN
56.0000 mg/m2 | Freq: Once | INTRAVENOUS | Status: AC
  Administered 2017-12-30: 90 mg via INTRAVENOUS
  Filled 2017-12-30: qty 30

## 2017-12-30 MED ORDER — SODIUM CHLORIDE 0.9 % IV SOLN
Freq: Once | INTRAVENOUS | Status: AC
  Administered 2017-12-30: 10:00:00 via INTRAVENOUS
  Filled 2017-12-30: qty 250

## 2017-12-30 MED ORDER — SODIUM CHLORIDE 0.9% FLUSH
10.0000 mL | INTRAVENOUS | Status: DC | PRN
  Administered 2017-12-30: 10 mL
  Filled 2017-12-30: qty 10

## 2017-12-30 MED ORDER — HEPARIN SOD (PORK) LOCK FLUSH 100 UNIT/ML IV SOLN
500.0000 [IU] | Freq: Once | INTRAVENOUS | Status: AC | PRN
  Administered 2017-12-30: 500 [IU]
  Filled 2017-12-30: qty 5

## 2017-12-30 MED ORDER — SODIUM CHLORIDE 0.9% FLUSH
10.0000 mL | Freq: Once | INTRAVENOUS | Status: AC
  Administered 2017-12-30: 10 mL via INTRAVENOUS
  Filled 2017-12-30: qty 10

## 2017-12-30 MED ORDER — PROCHLORPERAZINE MALEATE 10 MG PO TABS
ORAL_TABLET | ORAL | Status: AC
  Filled 2017-12-30: qty 1

## 2017-12-30 MED FILL — ACYCLOVIR 800 MG TABLET: 800 | 30 days supply | Qty: 60 | Fill #1

## 2017-12-30 MED FILL — OMEPRAZOLE 20 MG CPDR: 20 | 30 days supply | Qty: 30 | Fill #0

## 2017-12-30 MED FILL — VIREAD 300 MG TABLET: 300 | 30 days supply | Qty: 30 | Fill #1

## 2017-12-30 NOTE — Patient Instructions (Signed)
Implanted Port Home Guide An implanted port is a type of central line that is placed under the skin. Central lines are used to provide IV access when treatment or nutrition needs to be given through a person's veins. Implanted ports are used for long-term IV access. An implanted port may be placed because:  You need IV medicine that would be irritating to the small veins in your hands or arms.  You need long-term IV medicines, such as antibiotics.  You need IV nutrition for a long period.  You need frequent blood draws for lab tests.  You need dialysis.  Implanted ports are usually placed in the chest area, but they can also be placed in the upper arm, the abdomen, or the leg. An implanted port has two main parts:  Reservoir. The reservoir is round and will appear as a small, raised area under your skin. The reservoir is the part where a needle is inserted to give medicines or draw blood.  Catheter. The catheter is a thin, flexible tube that extends from the reservoir. The catheter is placed into a large vein. Medicine that is inserted into the reservoir goes into the catheter and then into the vein.  How will I care for my incision site? Do not get the incision site wet. Bathe or shower as directed by your health care provider. How is my port accessed? Special steps must be taken to access the port:  Before the port is accessed, a numbing cream can be placed on the skin. This helps numb the skin over the port site.  Your health care provider uses a sterile technique to access the port. ? Your health care provider must put on a mask and sterile gloves. ? The skin over your port is cleaned carefully with an antiseptic and allowed to dry. ? The port is gently pinched between sterile gloves, and a needle is inserted into the port.  Only "non-coring" port needles should be used to access the port. Once the port is accessed, a blood return should be checked. This helps ensure that the port  is in the vein and is not clogged.  If your port needs to remain accessed for a constant infusion, a clear (transparent) bandage will be placed over the needle site. The bandage and needle will need to be changed every week, or as directed by your health care provider.  Keep the bandage covering the needle clean and dry. Do not get it wet. Follow your health care provider's instructions on how to take a shower or bath while the port is accessed.  If your port does not need to stay accessed, no bandage is needed over the port.  What is flushing? Flushing helps keep the port from getting clogged. Follow your health care provider's instructions on how and when to flush the port. Ports are usually flushed with saline solution or a medicine called heparin. The need for flushing will depend on how the port is used.  If the port is used for intermittent medicines or blood draws, the port will need to be flushed: ? After medicines have been given. ? After blood has been drawn. ? As part of routine maintenance.  If a constant infusion is running, the port may not need to be flushed.  How long will my port stay implanted? The port can stay in for as long as your health care provider thinks it is needed. When it is time for the port to come out, surgery will be   done to remove it. The procedure is similar to the one performed when the port was put in. When should I seek immediate medical care? When you have an implanted port, you should seek immediate medical care if:  You notice a bad smell coming from the incision site.  You have swelling, redness, or drainage at the incision site.  You have more swelling or pain at the port site or the surrounding area.  You have a fever that is not controlled with medicine.  This information is not intended to replace advice given to you by your health care provider. Make sure you discuss any questions you have with your health care provider. Document  Released: 01/20/2005 Document Revised: 06/28/2015 Document Reviewed: 09/27/2012 Elsevier Interactive Patient Education  2017 [employer]  

## 2017-12-30 NOTE — Patient Instructions (Signed)
[facility] Discharge Instructions for Patients Receiving Chemotherapy  Today you received the following chemotherapy agents:  Kyprolis  To help prevent nausea and vomiting after your treatment, we encourage you to take your nausea medication as directed.   If you develop nausea and vomiting that is not controlled by your nausea medication, call the clinic.   BELOW ARE SYMPTOMS THAT SHOULD BE REPORTED IMMEDIATELY:  *FEVER GREATER THAN 100.5 F  *CHILLS WITH OR WITHOUT FEVER  NAUSEA AND VOMITING THAT IS NOT CONTROLLED WITH YOUR NAUSEA MEDICATION  *UNUSUAL SHORTNESS OF BREATH  *UNUSUAL BRUISING OR BLEEDING  TENDERNESS IN MOUTH AND THROAT WITH OR WITHOUT PRESENCE OF ULCERS  *URINARY PROBLEMS  *BOWEL PROBLEMS  UNUSUAL RASH Items with * indicate a potential emergency and should be followed up as soon as possible.  Feel free to call the clinic should you have any questions or concerns. The clinic phone number is [phone].  Please show the CHEMO ALERT CARD at check-in to the Emergency Department and triage nurse.   

## 2017-12-30 NOTE — Progress Notes (Signed)
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    10/10/2014 Miscellaneous    Peripheral blood chemistry and leukocytosis with total white count 78K, comprised of large plasma cells and his normocytic anemia. There is a myeloid left shift with previous surgical radium blasts. Flow cytometry showed 64% plasma cells    10/10/2014 Bone Marrow Biopsy    Markedly hypercellular marrow (95%), Atypical plasma cells comprise 57% of the cellularity. There was diminished multilineage in hematopoiesis with adequate maturation. Breasts less than 1%), no overt dysplasia of the myeloid or erythroid lineages.     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 Her transplant course was complicated by sepsis from Escherichia coli bacteremia and associated colitis, she was discharged home on 04/27/2015.    05/07/2015 - 05/12/2015 Hospital Admission    [facility] fever, tachycardia, nausea and abdominal pain. ID workup was negative, EGD showed evidence of gastritis and duodenitis, no  H. pylori or CMV.    05/20/2015 - 05/24/2015 Hospital Admission    [facility]    07/11/2015 Bone Marrow Biopsy    Post transplant 100 a bone marrow biopsy showed hypocellular marrow, 20%, no increase in plasma cells (2%) or other abnormalities.    08/22/2015 - 01/02/2016 Chemotherapy    MaintenanceCyborD (cytoxan 3046mm2 iv, bortezomib 1.5 mg/m, dexamethasone 40 mg, every 2 weeks, changed to Velcade maintenance after 4 months treatment    01/16/2016 - 04/19/2016 Chemotherapy    Maintenance Velcade 1.3 mg/m every 2 weeks    02/22/2016 Pathology Results    BONE MARROW: Diagnosis Bone Marrow, Aspirate,Biopsy, and Clot, right iliac - NORMOCELLULAR BONE MARROW FOR AGE WITH TRILINEAGE HEMATOPOIESIS. - PLASMACYTOSIS (PLASMA CELLS 12%). - SEE COMMENT. PERIPHERAL BLOOD: - OCCASIONAL CIRCULATING PLASMA CELLS.    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     04/18/2016 - 03/26/2017 Chemotherapy    Daratumumab per protocol  CyBorD every week, cytoxan was held after 04/24/2016 dye to cytopenia and infection  -discontinued due to Hep B flare    05/11/2016 - 05/16/2016 Hospital Admission    Healthcare-associated pneumonia    02/16/2017 - 02/21/2017 Hospital Admission    Admission diagnosis: Abnormal LFT's Additional comments: Assoc diagnoses: Hep B flare, transminitis, dehydration, fever, SIRS    04/06/2017 -  Chemotherapy    Carfilzomib 56 mg/m2 on days 1 and 15 (except 2091m2 on C1D1 and C1D2) with dexamethasone 20 mg on same days and pomalidomide 2 mg on days 1-21 of 28 day cycle        08/26/2017 - 08/28/2017 Hospital Admission  Admit date: 08/26/2017 Admission diagnosis: RLL Pneumonia    CURRENT THERAPY: 1.carfilzomib 56 mg/m2 on days 1 and 15(except 40m/m2 on C1D1 and C1D2)with dexamethasone 20 mg on same days and pomalidomide 2 mg on days 1-21 of 28 day cycleon  04/13/17, dexa stopped on 07/06/2017 due to her CR 2.Zometa every 4 weeks, started on 11/27/2015, plan for 2 years. Last dose 12/02/17        INTERVAL HISTORY: Ms. HCambareturns for f/u and kyprolis as scheduled. She completed cycle 9 day 15 on 11/13. She is beginning week 2 of pomalyst. She underwent brain MRI for headaches, which have gone on now almost 5 weeks. She continues to report tightness and pressure in her forehead and behind her eyes. Pain is 6/10 today. She does not take a lot of medication for pain. Denies blurry or double vision, lightheadedness, dizziness, light sensitivity. She is sensitive to sound. Pain improves when lying down to sleep. She does not wake up with a headache, but has them most evenings. She denies nasal congestion, sore throat, fever, chills, cough, chest pain, dyspnea. She has nausea and vomiting the day of chemo after the infusion, zofran ODT helps some. Body aches are much improved. Otherwise she denies trouble with her bowels, mucositis, neuropathy. Her headaches make her fatigued at times.    MEDICAL HISTORY:  Past Medical History:  Diagnosis Date  . Chills with fever    intermittently since d/c from hospital  . Dysuria-frequency syndrome    w/ pink urine  . GERD (gastroesophageal reflux disease)   . Hepatitis   . History of positive PPD    DX 2011--  CXR DONE NO EVIDENCE  . History of ureter stent   . Hydronephrosis, right   . Neuromuscular disorder (HCC)    legs numb intermittently  . 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   . Pneumonia   . Right ureteral stone   . Urosepsis 8/14   admitted to [facility]    SURGICAL HISTORY: Past Surgical History:  Procedure Laterality Date  . CYSTOSCOPY W/ URETERAL STENT PLACEMENT Right 09/25/2012   Procedure: CYSTOSCOPY WITH RETROGRADE PYELOGRAM/URETERAL STENT PLACEMENT;  Surgeon: TAlexis Frock MD;  Location: [facility];  Service: Urology;  Laterality: Right;  . CYSTOSCOPY WITH RETROGRADE PYELOGRAM, URETEROSCOPY AND STENT  PLACEMENT Right 10/15/2012   Procedure: CYSTOSCOPY WITH RETROGRADE PYELOGRAM, URETEROSCOPY AND REMOVAL STENT WITH  STENT PLACEMENT;  Surgeon: TAlexis Frock MD;  [facility]  Service: Urology;  Laterality: Right;  . ESOPHAGOGASTRODUODENOSCOPY (EGD) WITH PROPOFOL N/A 11/16/2014   Procedure: ESOPHAGOGASTRODUODENOSCOPY (EGD) WITH PROPOFOL;  Surgeon: DMilus Banister MD;  Location: [facility];  Service: Endoscopy;  Laterality: N/A;  . HOLMIUM LASER APPLICATION Right 94/54/0981  Procedure: HOLMIUM LASER APPLICATION;  Surgeon: TAlexis Frock MD;  [facility]  Service: Urology;  Laterality: Right;  . LIVER BIOPSY    . OTHER SURGICAL HISTORY Right    removal of ovarian cyst  . removal of uterine cyst     years ago  . RIGHT VATS W/ DRAINAGE PEURAL EFFUSION AND BX'S  10-30-2008    I have reviewed the social history and family history with the patient and they are unchanged from previous note.  ALLERGIES:  has No Known Allergies.  MEDICATIONS:  Current Outpatient Medications  Medication Sig Dispense Refill  . acyclovir (ZOVIRAX) 800 MG tablet Take 1 tablet (800 mg total) by mouth 2 (two) times daily. 60 tablet 5  . chlorpheniramine-HYDROcodone (TUSSIONEX) 10-8 MG/5ML SUER Take 5  mLs by mouth every 12 (twelve) hours as needed for cough. 140 mL 0  . feeding supplement, ENSURE ENLIVE, (ENSURE ENLIVE) LIQD Take 237 mLs by mouth 2 (two) times daily between meals. 237 mL 12  . ibuprofen (ADVIL,MOTRIN) 200 MG tablet Take 200 mg by mouth every 6 (six) hours as needed for moderate pain.    . magic mouthwash SOLN Take 5 mLs by mouth 4 (four) times daily as needed for mouth pain. 240 mL 3  . omeprazole (PRILOSEC) 20 MG capsule Take 1 capsule (20 mg total) by mouth daily. 30 capsule 5  . ondansetron (ZOFRAN ODT) 8 MG disintegrating tablet Take 1 tablet (8 mg total) by mouth every 8 (eight) hours as needed for nausea or vomiting. 40 tablet 1  . polyethylene  glycol (MIRALAX / GLYCOLAX) packet Take 17 g by mouth daily as needed (constipation). 14 each 1  . prochlorperazine (COMPAZINE) 10 MG tablet Take 1 tablet (10 mg total) by mouth every 6 (six) hours as needed for nausea or vomiting. 30 tablet 3  . VIREAD 300 MG tablet Take 1 tablet (300 mg total) by mouth daily. 30 tablet 11  . pomalidomide (POMALYST) 2 MG capsule Take 1 capsule (2 mg total) by mouth daily. Take with water on days 1-21. Repeat every 28 days. 21 capsule 0  . potassium chloride SA (K-DUR,KLOR-CON) 20 MEQ tablet Take 1 tablet (20 mEq total) by mouth daily. 30 tablet 0  . promethazine (PHENERGAN) 25 MG tablet Take 1 tablet (25 mg total) by mouth every 6 (six) hours as needed for nausea or vomiting. 30 tablet 0   No current facility-administered medications for this visit.    Facility-Administered Medications Ordered in Other Visits  Medication Dose Route Frequency Provider Last Rate Last Dose  . sodium chloride 0.9 % injection 10 mL  10 mL Intravenous PRN Truitt Merle, MD   10 mL at 12/11/15 1532  . sodium chloride 0.9 % injection 10 mL  10 mL Intravenous PRN Truitt Merle, MD   10 mL at 06/11/16 1505  . sodium chloride 0.9 % injection 10 mL  10 mL Intravenous PRN Truitt Merle, MD   10 mL at 08/04/17 1643  . sodium chloride flush (NS) 0.9 % injection 10 mL  10 mL Intravenous PRN Truitt Merle, MD   10 mL at 10/09/15 1717  . sodium chloride flush (NS) 0.9 % injection 10 mL  10 mL Intracatheter PRN Truitt Merle, MD   10 mL at 12/30/17 1222    PHYSICAL EXAMINATION: ECOG PERFORMANCE STATUS: 1 - Symptomatic but completely ambulatory  Vitals:   12/30/17 0920  BP: 118/81  Pulse: 72  Resp: 17  Temp: 98 F (36.7 C)  SpO2: 100%   Filed Weights   12/30/17 0920  Weight: 134 lb 4.8 oz (60.9 kg)    GENERAL:alert, no distress and comfortable SKIN:  no rashes or significant lesions EYES: sclera clear OROPHARYNX:no thrush or ulcers  LYMPH:  no palpable cervical or supraclavicular  lymphadenopathy LUNGS: clear to auscultation with normal breathing effort HEART: regular rate & rhythm, no lower extremity edema ABDOMEN:abdomen soft, non-tender and normal bowel sounds NEURO: alert & oriented x 3 with fluent speech, no focal motor/sensory deficits PAC without erythema    LABORATORY DATA:  I have reviewed the data as listed CBC Latest Ref Rng & Units 12/30/2017 12/16/2017 12/02/2017  WBC 4.0 - 10.5 K/uL 4.5 3.8(L) 4.5  Hemoglobin 12.0 - 15.0 g/dL 10.6(L) 10.5(L) 10.7(L)  Hematocrit 36.0 - 46.0 %  33.7(L) 33.5(L) 33.9(L)  Platelets 150 - 400 K/uL 261 166 180     CMP Latest Ref Rng & Units 12/30/2017 12/16/2017 12/02/2017  Glucose 70 - 99 mg/dL 72 100(H) 104(H)  BUN 6 - 20 mg/dL '16 12 16  ' Creatinine 0.44 - 1.00 mg/dL 0.68 0.78 0.77  Sodium 135 - 145 mmol/L 143 141 141  Potassium 3.5 - 5.1 mmol/L 3.7 3.7 3.6  Chloride 98 - 111 mmol/L 109 108 105  CO2 22 - 32 mmol/L '26 27 29  ' Calcium 8.9 - 10.3 mg/dL 9.4 9.7 9.7  Total Protein 6.5 - 8.1 g/dL 7.0 7.2 7.1  Total Bilirubin 0.3 - 1.2 mg/dL 0.2(L) 0.3 0.3  Alkaline Phos 38 - 126 U/L 93 79 103  AST 15 - 41 U/L '22 21 26  ' ALT 0 - 44 U/L 7 <6 8   SPEP M-protein  09/15/2014: 4.2 10/08/14: 4.6 12/08/2014: 0.2 02/15/2015: not sufficient sample for test  09/11/2015: not observed 10/09/2015: not det  11/06/2015: not det  12/25/2015: not det  02/13/2016: 0.3 03/12/2016: 0.8 04/09/2016: 0.6 05/08/16: 0.1 06/04/16: 0.1 07/02/2016: 0.2 07/09/2016: 0.2 (dara specific IFE negative) 08/05/16: Not observed  09/01/16: Not observed 09/29/16: 0.1(dara specific IFE negative) 10/27/16: Not observed 11/24/16 : Not observed 12/23/16: 0.1 (Dara specific IFE showed IgG monoclonal protein with lambda light chain) 01/21/2017: 0.1 (Dara specific IFE showed IgG monoclonal protein with lambda light chain) 03/26/17: 0.2 04/27/17: 0.1 05/25/17: Not observed 06/23/17: Not observed 08/31/17: 0.5 09/29/2017; not observed 10/22/17: Not Observed(Dara specific  IFEnegative) 11/04/17: not observed  11/18/17: not observed 12/02/17: not observed  12/16/17: not observed  12/30/17 PENDING   IgG mg/dl  11/03/2014: 3150  12/08/2014: 759 02/14/2014: 860 09/11/2015: 5277 10/09/2015: 1457 11/06/2015: 1504 12/25/2015: 1400 02/13/2016: 1543 03/12/2016: 1860 04/09/2016: 1620 05/08/16: 749 06/04/16: 751 5/30/20187: 796 07/09/2016: 701 08/05/16: 678 09/01/16: 650 09/29/16: 727 10/27/16: 634 11/24/16: 685  12/23/16: 714 01/21/2017: 709 03/26/17: 874 04/27/17: 1067 05/25/17: 1161 06/23/17: 1211 08/17/17: 1336 08/31/17: 1438 10/22/17: 1,390 11/04/17: 1328 11/18/17: 1038 12/02/17: 1131 12/16/17: 1314 12/30/17: PENDING    Kappa/lambda light chains levels and ration  11/03/14: 0.75, 152, 0.00 12/22/2014: 1.10, 2.60, 0.42 02/15/2015: 9.59,14.35, 0.67 09/11/2015: 2.44, 2.72, 0.90 10/09/2015: 3.09, 3.49, 0.89 11/06/2015: 2.30, 2.62, 0.88 12/25/2015: 2.51, 3.0, 0.84 02/13/2016: 2.64, 15.3, 0.17 03/12/2016: 2.27, 45.5, 0.05 04/09/2016: 3.33, 45.5, 0.07 05/08/2016: 0.49, 1.21, 0.40 06/04/2016: 0.83, 1.11, 0.75 07/02/16: 0.51, 1.43, 0.36 07/23/16: 0.68, 1.02, 0.67 08/05/16: 0.62, 1.09, 0.57 09/01/16: 0.80, 1.21, 0.66 09/29/16: 0.44, 1.07, 0.41 10/27/16: 0.63, 1.44, 0.44 11/24/16: 0.83, 2.13, 0.39 12/23/16: 0.69, 2.31, 0.30 01/21/2017: 0.77, 3.08, 0.25   24 h urine UPEP/IFE and light chain: 11/03/2014: IFE showed a monoclonal IgG heavy chain with associated lambda light chain. M protein was Undetectable    PATHOLOGY  01/21/2018 Cytogenetics  Normal  PATHOLOGY REPORT  Bone Marrow (BM) and Peripheral Blood (PB) FINAL PATHOLOGIC DIAGNOSIS  BONE MARROW: 07/11/2015 Hypocellular marrow (20%) with no increase in plasma cells (2%). See comment. PERIPHERAL BLOOD: Mild anemia. No circulating plasma cells identified. See comment and CBC data.  BONE MARROW: 02/22/2016 Diagnosis Bone Marrow, Aspirate,Biopsy, and Clot, right iliac - NORMOCELLULAR BONE MARROW FOR AGE WITH  TRILINEAGE HEMATOPOIESIS. - PLASMACYTOSIS (PLASMA CELLS 12%). - SEE COMMENT. PERIPHERAL BLOOD: - OCCASIONAL CIRCULATING PLASMA CELLS.   PROCEDURES  Bone Marrow Biopsy, 01/21/17 Bone Marrow, Aspirate, Biopsy, and Clot - NORMOCELLULAR BONE MARROW FOR AGE WITH TRILINEAGE HEMATOPOIESIS AND 2% PLASMA CELLS. PERIPHERAL BLOOD: - NORMOCYTIC-HYPOCHROMIC ANEMIA.     RADIOGRAPHIC STUDIES: I  have personally reviewed the radiological images as listed and agreed with the findings in the report. Mr Brain Wo Contrast  Result Date: 12/28/2017 CLINICAL DATA:  Chronic non intractable headache. Malignancy suspected. History of leukemia EXAM: MRI HEAD WITHOUT CONTRAST TECHNIQUE: Multiplanar, multiecho pulse sequences of the brain and surrounding structures were obtained without intravenous contrast. COMPARISON:  None. FINDINGS: Brain: No acute infarction, hemorrhage, hydrocephalus, extra-axial collection or mass lesion. Few (up to 10) FLAIR hyperintensities in the cerebral white matter, slightly greater than commonly seen for age, attributed to nonspecific remote insult. No swelling or, edema or mass effect. Vascular: Give major flow voids are preserved Skull and upper cervical spine: T2 hyperintensity within the type lobe space of the left parietal bone measuring 12 mm, with well-defined sclerotic margin, benign-appearing Sinuses/Orbits: Negative Other: Best seen on diffusion imaging there are bilateral enlarged cervical lymph nodes, greater on the left. IMPRESSION: 1. Negative brain MRI.  No specific explanation for headache. 2. History of leukemia with mildly enlarged lymph nodes in the neck. Electronically Signed   By: Monte Fantasia M.D.   On: 12/28/2017 15:02     ASSESSMENT & PLAN: 47 y.o.[place] woman, presented with anemia and leokocytosis    1. 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 Transaminitis and hyperbilirubinemia, secondary to hepatitis B flare - resolved,  on viread 3. Type 2 diabetes, steroids induced  -This is likely steroids induced hyperglycemia -she was on metformin when she was on dexa 4. GERD, history of GI bleeding and nausea  -Colonoscopy on 11/26/16 per Dr. Ardis Hughs notable for a polyp in the transverse colon, revealed to be tubular adenoma and negative for high grade dysplasia or malignancy.  5. Constipation 6. Body pain- improved  7. Insurance issue 8. Headache - negative brain MRI 12/28/17   Ms. Guida appears stable. She completed cycle 9 kyprolis and continues pomalyst on days 1-21 of 28 day cycle. She is tolerating treatment well overall, with nausea and vomiting on the day of infusion and generalized body aches, which have improved. Otherwise she is doing well.   She continues to have daily frontal headache. Dr. Burr Medico ordered brain MRI to r/o kyprolis-induced posterior reversible encephalopathy syndrome; MRI was negative. I reviewed with Dr. Burr Medico. At this point will refer to Dr. Mickeal Skinner for his input.   Labs reviewed, most recent SPEP with undetectable M spike. CBC and CMP are stable. Labs adequate to continue current regimen. She will proceed with cycle 10 day 1 kyprolis today; she will complete current pomalyst cycle next week, then off 1 week. Will refill today. She will return for day 15 kyprolis in 2 weeks and f/u in 4 weeks with next cycle.   PLAN: -Labs reviewed, MRI negative -Proceed with cycle 10 day 1 kyprolis today -Return in 2 weeks for day 15 -F/u in 4 weeks with cycle 11  -Continue Pomalyst current regimen, refilled today  -Refer to Dr. Mickeal Skinner for headache    Orders Placed This Encounter  Procedures  . Ambulatory referral to Oncology    Referral Priority:   Routine    Referral Type:   Consultation    Referral Reason:   Specialty Services Required    Number of Visits Requested:   1   All questions were answered. The patient knows to call the clinic with any problems, questions or concerns. No barriers to  learning was detected. I spent 20 minutes counseling the patient face to face. The total time spent in the appointment was  25 minutes and more than 50% was on counseling and review of test results     Sheryl Feeling, NP 12/30/17

## 2017-12-30 NOTE — Telephone Encounter (Signed)
Oral Oncology Pharmacist Encounter  Received notification from [facility] that they had received a new prescription for Pomalyst, but were under the impression that patient was receiving her Pomalyst through manufacturer assistance.  After investigation prescription should have been sent to [facility] by [facility] as patient is receiving her Pomalyst at no out-of-pocket cost to herself through [place] patient assistance.  Pomalyst prescription has now been E scribed to correct dispensing pharmacy.  Johny Drilling, PharmD, BCPS, BCOP  12/30/2017 3:53 PM Oral Oncology Clinic [phone]

## 2017-12-30 NOTE — Progress Notes (Signed)
Prescription faxed to [facility] [phone] for Pomalyst refill.

## 2018-01-01 ENCOUNTER — Telehealth: Payer: Self-pay | Admitting: Hematology

## 2018-01-01 ENCOUNTER — Telehealth: Payer: Self-pay | Admitting: *Deleted

## 2018-01-01 NOTE — Telephone Encounter (Signed)
Scheduled patient appointments per 11/27 los. Printed and mailed calendar.  **had to turn my restrictions off in order to see the 3 hour treatment times*

## 2018-01-01 NOTE — Telephone Encounter (Signed)
New Patient referral received from Dr Burr Medico to see Dr Mickeal Skinner for 4 week headache management.  Used [employer] [phone] Client ID [phone]  Called patient, lm in [place] for returned call to schedule New Patient Appointment.

## 2018-01-04 LAB — PROTEIN ELECTROPHORESIS, SERUM
A/G Ratio: 1.2 (ref 0.7–1.7)
Albumin ELP: 3.6 g/dL (ref 2.9–4.4)
Alpha-1-Globulin: 0.2 g/dL (ref 0.0–0.4)
Alpha-2-Globulin: 0.8 g/dL (ref 0.4–1.0)
BETA GLOBULIN: 1 g/dL (ref 0.7–1.3)
Gamma Globulin: 1.1 g/dL (ref 0.4–1.8)
Globulin, Total: 3.1 g/dL (ref 2.2–3.9)
TOTAL PROTEIN ELP: 6.7 g/dL (ref 6.0–8.5)

## 2018-01-05 LAB — IFE, DARA-SPECIFIC, SERUM
IgA: 207 mg/dL (ref 87–352)
IgG (Immunoglobin G), Serum: 1210 mg/dL (ref 700–1600)
IgM (Immunoglobulin M), Srm: 31 mg/dL (ref 26–217)

## 2018-01-08 ENCOUNTER — Other Ambulatory Visit: Payer: Self-pay | Admitting: Hematology

## 2018-01-12 ENCOUNTER — Other Ambulatory Visit: Payer: Self-pay | Admitting: Hematology

## 2018-01-12 DIAGNOSIS — C9012 Plasma cell leukemia in relapse: Secondary | ICD-10-CM

## 2018-01-13 ENCOUNTER — Telehealth: Payer: Self-pay

## 2018-01-13 ENCOUNTER — Ambulatory Visit (HOSPITAL_COMMUNITY)
Admission: RE | Admit: 2018-01-13 | Discharge: 2018-01-13 | Disposition: A | Discharge status: Home / Self Care | Payer: Medicaid Other | Source: Ambulatory Visit | Attending: Hematology | Admitting: Hematology

## 2018-01-13 ENCOUNTER — Inpatient Hospital Stay: Payer: Medicaid Other

## 2018-01-13 ENCOUNTER — Inpatient Hospital Stay: Payer: Medicaid Other | Attending: Hematology

## 2018-01-13 ENCOUNTER — Telehealth: Payer: Self-pay | Admitting: Hematology

## 2018-01-13 ENCOUNTER — Inpatient Hospital Stay (HOSPITAL_BASED_OUTPATIENT_CLINIC_OR_DEPARTMENT_OTHER): Payer: Medicaid Other | Admitting: Hematology

## 2018-01-13 VITALS — BP 115/69 | HR 84 | Temp 98.3°F | Resp 18 | Ht 62.0 in | Wt 132.0 lb

## 2018-01-13 DIAGNOSIS — B181 Chronic viral hepatitis B without delta-agent: Secondary | ICD-10-CM | POA: Diagnosis not present

## 2018-01-13 DIAGNOSIS — R52 Pain, unspecified: Secondary | ICD-10-CM | POA: Diagnosis not present

## 2018-01-13 DIAGNOSIS — Z79899 Other long term (current) drug therapy: Secondary | ICD-10-CM | POA: Diagnosis not present

## 2018-01-13 DIAGNOSIS — C9010 Plasma cell leukemia not having achieved remission: Secondary | ICD-10-CM

## 2018-01-13 DIAGNOSIS — D649 Anemia, unspecified: Secondary | ICD-10-CM | POA: Diagnosis not present

## 2018-01-13 DIAGNOSIS — C9012 Plasma cell leukemia in relapse: Secondary | ICD-10-CM | POA: Diagnosis present

## 2018-01-13 DIAGNOSIS — K5900 Constipation, unspecified: Secondary | ICD-10-CM | POA: Insufficient documentation

## 2018-01-13 DIAGNOSIS — Z7984 Long term (current) use of oral hypoglycemic drugs: Secondary | ICD-10-CM | POA: Insufficient documentation

## 2018-01-13 DIAGNOSIS — R05 Cough: Secondary | ICD-10-CM | POA: Insufficient documentation

## 2018-01-13 DIAGNOSIS — K219 Gastro-esophageal reflux disease without esophagitis: Secondary | ICD-10-CM | POA: Insufficient documentation

## 2018-01-13 DIAGNOSIS — Z5112 Encounter for antineoplastic immunotherapy: Secondary | ICD-10-CM | POA: Insufficient documentation

## 2018-01-13 DIAGNOSIS — J069 Acute upper respiratory infection, unspecified: Secondary | ICD-10-CM | POA: Insufficient documentation

## 2018-01-13 DIAGNOSIS — R058 Other specified cough: Secondary | ICD-10-CM

## 2018-01-13 DIAGNOSIS — E098 Drug or chemical induced diabetes mellitus with unspecified complications: Secondary | ICD-10-CM | POA: Diagnosis not present

## 2018-01-13 LAB — CMP (CANCER CENTER ONLY)
ALT: <6 U/L (ref 0–44)
AST: 21 U/L (ref 15–41)
Albumin: 3.3 g/dL — ABNORMAL LOW (ref 3.5–5.0)
Alkaline Phosphatase: 74 U/L (ref 38–126)
Anion gap: 8 (ref 5–15)
BUN: 13 mg/dL (ref 6–20)
CO2: 25 mmol/L (ref 22–32)
Calcium: 9.3 mg/dL (ref 8.9–10.3)
Chloride: 109 mmol/L (ref 98–111)
Creatinine: 0.74 mg/dL (ref 0.44–1.00)
GFR, Est AFR Am: >60 mL/min (ref >60)
GFR, Estimated: >60 mL/min (ref >60)
Glucose, Bld: 87 mg/dL (ref 70–99)
Potassium: 3.5 mmol/L (ref 3.5–5.1)
Sodium: 142 mmol/L (ref 135–145)
Total Bilirubin: 0.3 mg/dL (ref 0.3–1.2)
Total Protein: 7.3 g/dL (ref 6.5–8.1)

## 2018-01-13 LAB — CBC WITH DIFFERENTIAL (CANCER CENTER ONLY)
Abs Immature Granulocytes: 0.07 10*3/uL (ref 0.00–0.07)
Basophils Absolute: 0 10*3/uL (ref 0.0–0.1)
Basophils Relative: 0 %
Eosinophils Absolute: 0.1 10*3/uL (ref 0.0–0.5)
Eosinophils Relative: 3 %
HCT: 33.2 % — ABNORMAL LOW (ref 36.0–46.0)
Hemoglobin: 10.4 g/dL — ABNORMAL LOW (ref 12.0–15.0)
Immature Granulocytes: 1 %
Lymphocytes Relative: 19 %
Lymphs Abs: 0.9 10*3/uL (ref 0.7–4.0)
MCH: 30.6 pg (ref 26.0–34.0)
MCHC: 31.3 g/dL (ref 30.0–36.0)
MCV: 97.6 fL (ref 80.0–100.0)
MONO ABS: 0.5 10*3/uL (ref 0.1–1.0)
Monocytes Relative: 9 %
Neutro Abs: 3.3 10*3/uL (ref 1.7–7.7)
Neutrophils Relative %: 68 %
Platelet Count: 257 10*3/uL (ref 150–400)
RBC: 3.4 MIL/uL — ABNORMAL LOW (ref 3.87–5.11)
RDW: 13.8 % (ref 11.5–15.5)
WBC Count: 4.9 10*3/uL (ref 4.0–10.5)
nRBC: 0 % (ref 0.0–0.2)

## 2018-01-13 MED ORDER — HEPARIN SOD (PORK) LOCK FLUSH 100 UNIT/ML IV SOLN
500.0000 [IU] | Freq: Once | INTRAVENOUS | Status: AC
  Administered 2018-01-13: 500 [IU] via INTRAVENOUS
  Filled 2018-01-13: qty 5

## 2018-01-13 MED ORDER — LEVOFLOXACIN 750 MG PO TABS: 750.0000 mg | ORAL_TABLET | Freq: Every day | ORAL | 0 refills | Status: DC

## 2018-01-13 MED ORDER — POMALIDOMIDE 2 MG PO CAPS: 2.0000 mg | ORAL_CAPSULE | Freq: Every day | ORAL | 0 refills | Status: DC

## 2018-01-13 MED FILL — levoFLOXacin 750 MG TABS: 750 | 7 days supply | Qty: 7 | Fill #0

## 2018-01-13 NOTE — Progress Notes (Signed)
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    10/10/2014 Miscellaneous    Peripheral blood chemistry and leukocytosis with total white count 78K, comprised of large plasma cells and his normocytic anemia. There is a myeloid left shift with previous surgical radium blasts. Flow cytometry showed 64% plasma cells    10/10/2014 Bone Marrow Biopsy    Markedly hypercellular marrow (95%), Atypical plasma cells comprise 57% of the cellularity. There was diminished multilineage in hematopoiesis with adequate maturation. Breasts less than 1%), no overt dysplasia of the myeloid or erythroid lineages.     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    05/07/2015 - 05/12/2015 Hospital Admission    [facility], [facility]  [facility]. ID workup was negative, EGD showed evidence of gastritis and duodenitis, no H. pylori or CMV.    05/20/2015 - 05/24/2015 Hospital Admission    [facility]    07/11/2015 Bone Marrow Biopsy    Post transplant 100 a bone marrow biopsy showed hypocellular marrow, 20%, no increase in plasma cells (2%) or other abnormalities.    08/22/2015 - 01/02/2016 Chemotherapy    MaintenanceCyborD (cytoxan 3089mm2 iv, bortezomib 1.5 mg/m, dexamethasone 40 mg, every 2 weeks, changed to Velcade maintenance after 4 months treatment    01/16/2016 - 04/19/2016 Chemotherapy    Maintenance Velcade 1.3 mg/m every 2 weeks    02/22/2016 Pathology Results    BONE MARROW: Diagnosis Bone Marrow, Aspirate,Biopsy, and Clot, right iliac - NORMOCELLULAR BONE MARROW FOR AGE WITH TRILINEAGE HEMATOPOIESIS. - PLASMACYTOSIS (PLASMA CELLS 12%). - SEE COMMENT. PERIPHERAL BLOOD: - OCCASIONAL CIRCULATING PLASMA CELLS.    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     04/18/2016 - 03/26/2017 Chemotherapy    Daratumumab per protocol  CyBorD every week, cytoxan was held after 04/24/2016 dye to cytopenia and infection  -discontinued due to Hep B flare    05/11/2016 - 05/16/2016 Hospital Admission    Healthcare-associated pneumonia    02/16/2017 - 02/21/2017 Hospital Admission    Admission diagnosis: Abnormal LFT's Additional comments: Assoc diagnoses: Hep B flare, transminitis, dehydration, fever, SIRS    04/06/2017 -  Chemotherapy    Carfilzomib 56 mg/m2 on days 1 and 15 (except 2069m2 on C1D1 and C1D2) with dexamethasone 20 mg on same days and pomalidomide 2 mg on days 1-21  of 28 day cycle        08/26/2017 - 08/28/2017 Hospital Admission    Admit date: 08/26/2017 Admission diagnosis: RLL Pneumonia       CURRENT THERAPY:  Carfilzomib 56 mg/m2 on days 1 and 15(except 89m/m2 on C1D1 and C1D2)with  dexamethasone 20 mg on same days and pomalidomide 2 mg on days 1-21 of 28 day cycleon 04/13/17, dexa stopped on 07/06/2017 due to her CR    INTERVAL HISTORY:  Sheryl [name] is here for a follow up of cold symptoms. She presents to the clinic today with interpretor. She notes for the past 10 days she has cough with sore and dry throat. She notes yellow phlegm which is very little. She notes her appetite is not doing well lately. She has been fatigued with no motivation to do anything. She notes her cold came form change in weather and has spread this cold to her family at home. She denies diarrhea. She notes she asked for a refill for Pomalyst on last visit 2 weeks ago but did not receive so she was not taking it.      REVIEW OF SYSTEMS:   Constitutional: Denies fevers, chills or abnormal weight loss (+) fatigue (+) lower appetite  Eyes: Denies blurriness of vision Ears, nose, mouth, throat, and face: Denies mucositis (+)sore throat  Respiratory: Denies dyspnea or wheezes (+) cold with slight yellow phlegm production Cardiovascular: Denies palpitation, chest discomfort or lower extremity swelling Gastrointestinal:  Denies nausea, heartburn or change in bowel habits Skin: Denies abnormal skin rashes Lymphatics: Denies new lymphadenopathy or easy bruising Neurological:Denies numbness, tingling or new weaknesses Behavioral/Psych: Mood is stable, no new changes  All other systems were reviewed with the patient and are negative.  MEDICAL HISTORY:  Past Medical History:  Diagnosis Date  . Chills with fever    intermittently since d/c from hospital  . Dysuria-frequency syndrome    w/ pink urine  . GERD (gastroesophageal reflux disease)   . Hepatitis   . History of positive PPD    DX 2011--  CXR DONE NO EVIDENCE  . History of ureter stent   . Hydronephrosis, right   . Neuromuscular disorder (H[facility])    legs numb intermittently  . 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   . Pneumonia   . Right ureteral stone    . Urosepsis 8/14   ad[facility]    SURGICAL HISTORY: Past Surgical History:  Procedure Laterality Date  . CYSTOSCOPY W/ URETERAL STENT PLACEMENT Right 09/25/2012   Procedure: CYSTOSCOPY WITH RETROGRADE PYELOGRAM/URETERAL STENT PLACEMENT;  Surgeon: TAlexis Frock MD;  Location: [facility];  Service: Urology;  Laterality: Right;  . CYSTOSCOPY WITH RETROGRADE PYELOGRAM, URETEROSCOPY AND STENT PLACEMENT Right 10/15/2012   Procedure: CYSTOSCOPY WITH RETROGRADE PYELOGRAM, URETEROSCOPY AND REMOVAL STENT WITH  STENT PLACEMENT;  Surgeon: TAlexis Frock MD;  [facility]  Service: Urology;  Laterality: Right;  . ESOPHAGOGASTRODUODENOSCOPY (EGD) WITH PROPOFOL N/A 11/16/2014   Procedure: ESOPHAGOGASTRODUODENOSCOPY (EGD) WITH PROPOFOL;  Surgeon: DMilus Banister MD;  Location: [facility];  Service: Endoscopy;  Laterality: N/A;  . HOLMIUM LASER APPLICATION Right 91/61/0960  Procedure: HOLMIUM LASER APPLICATION;  Surgeon: TAlexis Frock MD;  [facility]  Service: Urology;  Laterality: Right;  . LIVER BIOPSY    . OTHER SURGICAL HISTORY Right    removal of ovarian cyst  . removal of uterine cyst     years ago  . RIGHT VATS W/ DRAINAGE PEURAL EFFUSION AND BX'S  10-30-2008  I have reviewed the social history and family history with the patient and they are unchanged from previous note.  ALLERGIES:  has No Known Allergies.  MEDICATIONS:  Current Outpatient Medications  Medication Sig Dispense Refill  . acyclovir (ZOVIRAX) 800 MG tablet Take 1 tablet (800 mg total) by mouth 2 (two) times daily. 60 tablet 5  . chlorpheniramine-HYDROcodone (TUSSIONEX) 10-8 MG/5ML SUER Take 5 mLs by mouth every 12 (twelve) hours as needed for cough. 140 mL 0  . feeding supplement, ENSURE ENLIVE, (ENSURE ENLIVE) LIQD Take 237 mLs by mouth 2 (two) times daily between meals. 237 mL 12  . ibuprofen (ADVIL,MOTRIN) 200 MG tablet Take 200 mg by mouth every 6 (six) hours as  needed for moderate pain.    . magic mouthwash SOLN Take 5 mLs by mouth 4 (four) times daily as needed for mouth pain. 240 mL 3  . omeprazole (PRILOSEC) 20 MG capsule Take 1 capsule (20 mg total) by mouth daily. 30 capsule 5  . ondansetron (ZOFRAN ODT) 8 MG disintegrating tablet Take 1 tablet (8 mg total) by mouth every 8 (eight) hours as needed for nausea or vomiting. 40 tablet 1  . polyethylene glycol (MIRALAX / GLYCOLAX) packet Take 17 g by mouth daily as needed (constipation). 14 each 1  . pomalidomide (POMALYST) 2 MG capsule Take 1 capsule (2 mg total) by mouth daily. Take with water on days 1-21. Repeat every 28 days. 21 capsule 0  . potassium chloride SA (K-DUR,KLOR-CON) 20 MEQ tablet Take 1 tablet (20 mEq total) by mouth daily. 30 tablet 0  . prochlorperazine (COMPAZINE) 10 MG tablet Take 1 tablet (10 mg total) by mouth every 6 (six) hours as needed for nausea or vomiting. 30 tablet 3  . promethazine (PHENERGAN) 25 MG tablet Take 1 tablet (25 mg total) by mouth every 6 (six) hours as needed for nausea or vomiting. 30 tablet 0  . VIREAD 300 MG tablet Take 1 tablet (300 mg total) by mouth daily. 30 tablet 11   Current Facility-Administered Medications  Medication Dose Route Frequency Provider Last Rate Last Dose  . heparin lock flush 100 unit/mL  500 Units Intravenous Once Truitt Merle, MD       Facility-Administered Medications Ordered in Other Visits  Medication Dose Route Frequency Provider Last Rate Last Dose  . sodium chloride 0.9 % injection 10 mL  10 mL Intravenous PRN Truitt Merle, MD   10 mL at 12/11/15 1532  . sodium chloride 0.9 % injection 10 mL  10 mL Intravenous PRN Truitt Merle, MD   10 mL at 06/11/16 1505  . sodium chloride 0.9 % injection 10 mL  10 mL Intravenous PRN Truitt Merle, MD   10 mL at 08/04/17 1643  . sodium chloride flush (NS) 0.9 % injection 10 mL  10 mL Intravenous PRN Truitt Merle, MD   10 mL at 10/09/15 1717    PHYSICAL EXAMINATION: ECOG PERFORMANCE STATUS: 2 -  Symptomatic, <50% confined to bed  Vitals:   01/13/18 0915  BP: 115/69  Pulse: 84  Resp: 18  Temp: 98.3 F (36.8 C)  SpO2: 100%   Filed Weights   01/13/18 0915  Weight: 132 lb (59.9 kg)    GENERAL:alert, no distress and comfortable SKIN: skin color, texture, turgor are normal, no rashes or significant lesions EYES: normal, Conjunctiva are pink and non-injected, sclera clear OROPHARYNX:no exudate, no erythema and lips, buccal mucosa, and tongue normal  NECK: supple, thyroid normal size, non-tender, without nodularity LYMPH:  no  palpable lymphadenopathy in the cervical, axillary or inguinal LUNGS: clear to auscultation and percussion (+) Decreased breath sounds on right side, locational ronchi.  HEART: regular rate & rhythm and no murmurs and no lower extremity edema ABDOMEN:abdomen soft, non-tender and normal bowel sounds Musculoskeletal:no cyanosis of digits and no clubbing  NEURO: alert & oriented x 3 with fluent speech, no focal motor/sensory deficits  LABORATORY DATA:  I have reviewed the data as listed CBC Latest Ref Rng & Units 01/13/2018 12/30/2017 12/16/2017  WBC 4.0 - 10.5 K/uL 4.9 4.5 3.8(L)  Hemoglobin 12.0 - 15.0 g/dL 10.4(L) 10.6(L) 10.5(L)  Hematocrit 36.0 - 46.0 % 33.2(L) 33.7(L) 33.5(L)  Platelets 150 - 400 K/uL 257 261 166     CMP Latest Ref Rng & Units 01/13/2018 12/30/2017 12/16/2017  Glucose 70 - 99 mg/dL 87 72 100(H)  BUN 6 - 20 mg/dL '13 16 12  ' Creatinine 0.44 - 1.00 mg/dL 0.74 0.68 0.78  Sodium 135 - 145 mmol/L 142 143 141  Potassium 3.5 - 5.1 mmol/L 3.5 3.7 3.7  Chloride 98 - 111 mmol/L 109 109 108  CO2 22 - 32 mmol/L '25 26 27  ' Calcium 8.9 - 10.3 mg/dL 9.3 9.4 9.7  Total Protein 6.5 - 8.1 g/dL 7.3 7.0 7.2  Total Bilirubin 0.3 - 1.2 mg/dL 0.3 0.2(L) 0.3  Alkaline Phos 38 - 126 U/L 74 93 79  AST 15 - 41 U/L '21 22 21  ' ALT 0 - 44 U/L <6 7 <6      RADIOGRAPHIC STUDIES: I have personally reviewed the radiological images as listed and agreed  with the findings in the report. No results found.   ASSESSMENT & PLAN:  Sheryl [name] is a 47 y.o. female with   1.  Acute bronchitis versus pneumonia  -She presents with sore throat, productive cough with yellow phlegm but no fever -On exam she has decreased breath sounds of right side and occasional ronchi -Will get chest X-ray to rule out infection. If infection present I will call in antibiotics -I will cancel treatment today to give her time to recover.   2.  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 She was treated with CyborD and bone marrow transplant. She progressed on maintenance Velcade in 02/2016. She was treated with more Dara and CyborD. She is currently being treated with Carfilzomib and Pomalyst.  -She has been tolerating moderately well with mild nausea.  -I will refill her Pomalyst today, she will start next week when she recovers from the URI  2.  Panic hepatitis B infection -She has known chronic hepatitis B infection, developed significant transaminitis and hyperbilirubinemia in January 2019 -She restart her Heb B treatment in 02/2017, transaminitis and hyperbilirubinemia resolved - She will continue to be closely followed by ID Dr. Baxter Flattery  3. Type 2 diabetes, steroids induced  -Treated with Metformin. -She has been off dexamethasone, not on metformin now.  Her random sugar has been normal.  4. GERD, history of GI bleeding and nausea  -Colonoscopy on 11/26/16 per Dr. Ardis Hughs notable for a polyp in the transverse colon, revealed to be tubular adenoma and negative for high grade dysplasia or malignancy.  -Continue omeprazole daily. We previously discussed steroids can worsen her acid reflux  5. Constipation -On Miralax twice daily and Colace -Currently resolved   6. Body pain -She has been having mild generalized body, especially in her thighs, since she started chemo.  It seems to be worse when  she switched to  current regiment.  -Overall manageable, she is not on pain medication.   Plan  -I refilled Pomalyst today, will start next week when she recovers form URI  -Cancel chemo today and reschedule to next week -return in one week for f/u and chemo  -CXR today. I reviewed the x-ray after clinic, and called in Levaquin 750 mg daily for 7 days.    No problem-specific Assessment & Plan notes found for this encounter.   Orders Placed This Encounter  Procedures  . DG Chest 2 View    Standing Status:   Future    Standing Expiration Date:   01/13/2019    Order Specific Question:   Reason for Exam (SYMPTOM  OR DIAGNOSIS REQUIRED)    Answer:   cough, rule out pneumonia    Order Specific Question:   Is patient pregnant?    Answer:   No    Order Specific Question:   Preferred imaging location?    Answer:   [facility]    Order Specific Question:   Radiology Contrast Protocol - do NOT remove file path    Answer:   [email or web address]   All questions were answered. The patient knows to call the clinic with any problems, questions or concerns. No barriers to learning was detected. I spent 20 minutes counseling the patient face to face. The total time spent in the appointment was 25 minutes and more than 50% was on counseling and review of test results     Truitt Merle, MD 01/13/2018   I, Joslyn Devon, am acting as scribe for Truitt Merle, MD.   I have reviewed the above documentation for accuracy and completeness, and I agree with the above.

## 2018-01-13 NOTE — Telephone Encounter (Signed)
Left voice message per Dr. Burr Medico that chest x-ray showed possibility of pneumonia, Levaquin (an antibiotic) has been send in to your pharmacy.  Instructed to take it for 7 days and to call back if she has questions.

## 2018-01-13 NOTE — Telephone Encounter (Signed)
Faxed script and [employer] Authorization to [facility], sent to HIM for scan

## 2018-01-13 NOTE — Telephone Encounter (Signed)
Printed calendar and avs. °

## 2018-01-14 ENCOUNTER — Encounter: Payer: Self-pay | Admitting: Hematology

## 2018-01-14 LAB — KAPPA/LAMBDA LIGHT CHAINS
Kappa free light chain: 23.8 mg/L — ABNORMAL HIGH (ref 3.3–19.4)
Kappa, lambda light chain ratio: 0.86 (ref 0.26–1.65)
Lambda free light chains: 27.8 mg/L — ABNORMAL HIGH (ref 5.7–26.3)

## 2018-01-19 LAB — MULTIPLE MYELOMA PANEL, SERUM
ALBUMIN SERPL ELPH-MCNC: 3.2 g/dL (ref 2.9–4.4)
Albumin/Glob SerPl: 1 (ref 0.7–1.7)
Alpha 1: 0.3 g/dL (ref 0.0–0.4)
Alpha2 Glob SerPl Elph-Mcnc: 1 g/dL (ref 0.4–1.0)
B-Globulin SerPl Elph-Mcnc: 1 g/dL (ref 0.7–1.3)
Gamma Glob SerPl Elph-Mcnc: 1.3 g/dL (ref 0.4–1.8)
Globulin, Total: 3.4 g/dL (ref 2.2–3.9)
IGG (IMMUNOGLOBIN G), SERUM: 1306 mg/dL (ref 700–1600)
IgA: 189 mg/dL (ref 87–352)
IgM (Immunoglobulin M), Srm: 44 mg/dL (ref 26–217)
TOTAL PROTEIN ELP: 6.6 g/dL (ref 6.0–8.5)

## 2018-01-21 ENCOUNTER — Inpatient Hospital Stay (HOSPITAL_BASED_OUTPATIENT_CLINIC_OR_DEPARTMENT_OTHER): Payer: Medicaid Other | Admitting: Hematology

## 2018-01-21 ENCOUNTER — Encounter: Payer: Self-pay | Admitting: Hematology

## 2018-01-21 ENCOUNTER — Inpatient Hospital Stay: Payer: Medicaid Other

## 2018-01-21 VITALS — BP 111/69 | HR 65 | Temp 97.9°F | Resp 18 | Ht 62.0 in | Wt 132.2 lb

## 2018-01-21 DIAGNOSIS — Z5112 Encounter for antineoplastic immunotherapy: Secondary | ICD-10-CM | POA: Diagnosis not present

## 2018-01-21 DIAGNOSIS — Z95828 Presence of other vascular implants and grafts: Secondary | ICD-10-CM

## 2018-01-21 DIAGNOSIS — C9010 Plasma cell leukemia not having achieved remission: Secondary | ICD-10-CM

## 2018-01-21 DIAGNOSIS — D649 Anemia, unspecified: Secondary | ICD-10-CM

## 2018-01-21 DIAGNOSIS — C9012 Plasma cell leukemia in relapse: Secondary | ICD-10-CM

## 2018-01-21 DIAGNOSIS — B181 Chronic viral hepatitis B without delta-agent: Secondary | ICD-10-CM

## 2018-01-21 LAB — CBC WITH DIFFERENTIAL (CANCER CENTER ONLY)
ABS IMMATURE GRANULOCYTES: 0.02 10*3/uL (ref 0.00–0.07)
BASOS PCT: 0 %
Basophils Absolute: 0 10*3/uL (ref 0.0–0.1)
Eosinophils Absolute: 0.1 10*3/uL (ref 0.0–0.5)
Eosinophils Relative: 2 %
HCT: 34.1 % — ABNORMAL LOW (ref 36.0–46.0)
Hemoglobin: 10.7 g/dL — ABNORMAL LOW (ref 12.0–15.0)
Immature Granulocytes: 1 %
Lymphocytes Relative: 26 %
Lymphs Abs: 0.9 10*3/uL (ref 0.7–4.0)
MCH: 30.7 pg (ref 26.0–34.0)
MCHC: 31.4 g/dL (ref 30.0–36.0)
MCV: 98 fL (ref 80.0–100.0)
Monocytes Absolute: 0.3 10*3/uL (ref 0.1–1.0)
Monocytes Relative: 10 %
Neutro Abs: 2.1 10*3/uL (ref 1.7–7.7)
Neutrophils Relative %: 61 %
PLATELETS: 171 10*3/uL (ref 150–400)
RBC: 3.48 MIL/uL — ABNORMAL LOW (ref 3.87–5.11)
RDW: 13.4 % (ref 11.5–15.5)
WBC Count: 3.3 10*3/uL — ABNORMAL LOW (ref 4.0–10.5)
nRBC: 0 % (ref 0.0–0.2)

## 2018-01-21 LAB — CMP (CANCER CENTER ONLY)
ALT: <6 U/L (ref 0–44)
AST: 24 U/L (ref 15–41)
Albumin: 3.4 g/dL — ABNORMAL LOW (ref 3.5–5.0)
Alkaline Phosphatase: 71 U/L (ref 38–126)
Anion gap: 8 (ref 5–15)
BILIRUBIN TOTAL: 0.4 mg/dL (ref 0.3–1.2)
BUN: 12 mg/dL (ref 6–20)
CO2: 26 mmol/L (ref 22–32)
Calcium: 9.4 mg/dL (ref 8.9–10.3)
Chloride: 108 mmol/L (ref 98–111)
Creatinine: 0.79 mg/dL (ref 0.44–1.00)
GFR, Estimated: >60 mL/min (ref >60)
Glucose, Bld: 114 mg/dL — ABNORMAL HIGH (ref 70–99)
Potassium: 3.6 mmol/L (ref 3.5–5.1)
Sodium: 142 mmol/L (ref 135–145)
Total Protein: 7.1 g/dL (ref 6.5–8.1)

## 2018-01-21 MED ORDER — DEXTROSE 5 % IV SOLN
56.0000 mg/m2 | Freq: Once | INTRAVENOUS | Status: AC
  Administered 2018-01-21: 90 mg via INTRAVENOUS
  Filled 2018-01-21: qty 15

## 2018-01-21 MED ORDER — PROCHLORPERAZINE MALEATE 10 MG PO TABS
10.0000 mg | ORAL_TABLET | Freq: Once | ORAL | Status: AC
  Administered 2018-01-21: 10 mg via ORAL

## 2018-01-21 MED ORDER — PALONOSETRON HCL INJECTION 0.25 MG/5ML
0.2500 mg | Freq: Once | INTRAVENOUS | Status: AC
  Administered 2018-01-21: 0.25 mg via INTRAVENOUS

## 2018-01-21 MED ORDER — SODIUM CHLORIDE 0.9 % IV SOLN
Freq: Once | INTRAVENOUS | Status: AC
  Administered 2018-01-21: 09:00:00 via INTRAVENOUS
  Filled 2018-01-21: qty 250

## 2018-01-21 MED ORDER — SODIUM CHLORIDE 0.9% FLUSH
10.0000 mL | INTRAVENOUS | Status: DC | PRN
  Administered 2018-01-21: 10 mL
  Filled 2018-01-21: qty 10

## 2018-01-21 MED ORDER — DEXAMETHASONE SODIUM PHOSPHATE 10 MG/ML IJ SOLN
INTRAMUSCULAR | Status: AC
  Filled 2018-01-21: qty 1

## 2018-01-21 MED ORDER — DEXAMETHASONE SODIUM PHOSPHATE 10 MG/ML IJ SOLN
8.0000 mg | Freq: Once | INTRAMUSCULAR | Status: AC
  Administered 2018-01-21: 8 mg via INTRAVENOUS

## 2018-01-21 MED ORDER — PALONOSETRON HCL INJECTION 0.25 MG/5ML
INTRAVENOUS | Status: AC
  Filled 2018-01-21: qty 5

## 2018-01-21 MED ORDER — PROCHLORPERAZINE MALEATE 10 MG PO TABS
ORAL_TABLET | ORAL | Status: AC
  Filled 2018-01-21: qty 1

## 2018-01-21 MED ORDER — HEPARIN SOD (PORK) LOCK FLUSH 100 UNIT/ML IV SOLN
500.0000 [IU] | Freq: Once | INTRAVENOUS | Status: AC | PRN
  Administered 2018-01-21: 500 [IU]
  Filled 2018-01-21: qty 5

## 2018-01-21 NOTE — Patient Instructions (Signed)
[place] Discharge Instructions for Patients Receiving Chemotherapy  Today you received the following chemotherapy agents: Carfilzomib (Kyprolis)  To help prevent nausea and vomiting after your treatment, we encourage you to take your nausea medication as directed. Received Aloxi during treatment today-->Take Compazine (not Zofran) for the next 3 days as needed.    If you develop nausea and vomiting that is not controlled by your nausea medication, call the clinic.   BELOW ARE SYMPTOMS THAT SHOULD BE REPORTED IMMEDIATELY:  *FEVER GREATER THAN 100.5 F  *CHILLS WITH OR WITHOUT FEVER  NAUSEA AND VOMITING THAT IS NOT CONTROLLED WITH YOUR NAUSEA MEDICATION  *UNUSUAL SHORTNESS OF BREATH  *UNUSUAL BRUISING OR BLEEDING  TENDERNESS IN MOUTH AND THROAT WITH OR WITHOUT PRESENCE OF ULCERS  *URINARY PROBLEMS  *BOWEL PROBLEMS  UNUSUAL RASH Items with * indicate a potential emergency and should be followed up as soon as possible.  Feel free to call the clinic should you have any questions or concerns. The clinic phone number is [phone].  Please show the [place] at check-in to the Emergency Department and triage nurse.

## 2018-01-21 NOTE — Progress Notes (Signed)
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    10/10/2014 Miscellaneous    Peripheral blood chemistry and leukocytosis with total white count 78K, comprised of large plasma cells and his normocytic anemia. There is a myeloid left shift with previous surgical radium blasts. Flow cytometry showed 64% plasma cells    10/10/2014 Bone Marrow Biopsy    Markedly hypercellular marrow (95%), Atypical plasma cells comprise 57% of the cellularity. There was diminished multilineage in hematopoiesis with adequate maturation. Breasts less than 1%), no overt dysplasia of the myeloid or erythroid lineages.     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    05/07/2015 - 05/12/2015 Hospital Admission    [facility], [facility]  ID workup was negative, EGD showed evidence of gastritis and duodenitis, no H. pylori or CMV.    05/20/2015 - 05/24/2015 Hospital Admission    [facility]    07/11/2015 Bone Marrow Biopsy    Post transplant 100 a bone marrow biopsy showed hypocellular marrow, 20%, no increase in plasma cells (2%) or other abnormalities.    08/22/2015 - 01/02/2016 Chemotherapy    MaintenanceCyborD (cytoxan 3047mm2 iv, bortezomib 1.5 mg/m, dexamethasone 40 mg, every 2 weeks, changed to Velcade maintenance after 4 months treatment    01/16/2016 - 04/19/2016 Chemotherapy    Maintenance Velcade 1.3 mg/m every 2 weeks    02/22/2016 Pathology Results    BONE MARROW: Diagnosis Bone Marrow, Aspirate,Biopsy, and Clot, right iliac - NORMOCELLULAR BONE MARROW FOR AGE WITH TRILINEAGE HEMATOPOIESIS. - PLASMACYTOSIS (PLASMA CELLS 12%). - SEE COMMENT. PERIPHERAL BLOOD: - OCCASIONAL CIRCULATING PLASMA CELLS.    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     04/18/2016 - 03/26/2017 Chemotherapy    Daratumumab per protocol  CyBorD every week, cytoxan was held after 04/24/2016 dye to cytopenia and infection  -discontinued due to Hep B flare    05/11/2016 - 05/16/2016 Hospital Admission    Healthcare-associated pneumonia    02/16/2017 - 02/21/2017 Hospital Admission    Admission diagnosis: Abnormal LFT's Additional comments: Assoc diagnoses: Hep B flare, transminitis, dehydration, fever, SIRS    04/06/2017 -  Chemotherapy    Carfilzomib 56 mg/m2 on days 1 and 15 (except 2031m2 on C1D1 and C1D2) with dexamethasone 20 mg on same days and pomalidomide 2 mg on days  1-21 of 28 day cycle        08/26/2017 - 08/28/2017 Hospital Admission    Admit date: 08/26/2017 Admission diagnosis: RLL Pneumonia       CURRENT THERAPY:  Carfilzomib 56 mg/m2 on days 1 and 15(except 42m/m2 on C1D1 and C1D2)with dexamethasone  20 mg on same days and pomalidomide 2 mg on days 1-21 of 28 day cycleon 04/13/17, dexa stopped on 07/06/2017 due to her CR  INTERVAL HISTORY:  [name] is here for a follow up of her URI and to resume chemo for her breast cancer leukemia.  She presents to the clinic today with interpreter. She notes she feels 80% better. She is eating better as well. She notes she completed her antibiotics which helped her symptoms. She denies sore throat and has improved cough. She does note mild epigastric abdominal pain and bloating. She was to see Dr. VMickeal Skinnerfor her headaches, but have recently improved and no longer want to consult with him. She notes she still has not gotten her pomalyst.     REVIEW OF SYSTEMS:   Constitutional: Denies fevers, chills or abnormal weight loss (+) much improved headaches  Eyes: Denies blurriness of vision Ears, nose, mouth, throat, and face: Denies mucositis or sore throat Respiratory: Denies dyspnea or wheezes (+) cough, improved  Cardiovascular: Denies palpitation, chest discomfort or lower extremity swelling Gastrointestinal:  Denies nausea, heartburn or change in bowel habits (+)  mild epigastric abdominal pain and bloating Skin: Denies abnormal skin rashes Lymphatics: Denies new lymphadenopathy or easy bruising Neurological:Denies numbness, tingling or new weaknesses Behavioral/Psych: Mood is stable, no new changes  All other systems were reviewed with the patient and are negative.  MEDICAL HISTORY:  Past Medical History:  Diagnosis Date  . Chills with fever    intermittently since d/c from hospital  . Dysuria-frequency syndrome    w/ pink urine  . GERD (gastroesophageal reflux disease)   . Hepatitis   . History of positive PPD    DX 2011--  CXR DONE NO EVIDENCE  . History of ureter stent   . Hydronephrosis, right   . Neuromuscular disorder (H[facility])    legs numb intermittently  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   . Pneumonia   . Right ureteral stone   . Urosepsis  8/14   ad[facility]    SURGICAL HISTORY: Past Surgical History:  Procedure Laterality Date  . CYSTOSCOPY W/ URETERAL STENT PLACEMENT Right 09/25/2012   Procedure: CYSTOSCOPY WITH RETROGRADE PYELOGRAM/URETERAL STENT PLACEMENT;  Surgeon: TAlexis Frock MD;  Location: [facility];  Service: Urology;  Laterality: Right;  . CYSTOSCOPY WITH RETROGRADE PYELOGRAM, URETEROSCOPY AND STENT PLACEMENT Right 10/15/2012   Procedure: CYSTOSCOPY WITH RETROGRADE PYELOGRAM, URETEROSCOPY AND REMOVAL STENT WITH  STENT PLACEMENT;  Surgeon: TAlexis Frock MD;  [facility]  Service: Urology;  Laterality: Right;  . ESOPHAGOGASTRODUODENOSCOPY (EGD) WITH PROPOFOL N/A 11/16/2014   Procedure: ESOPHAGOGASTRODUODENOSCOPY (EGD) WITH PROPOFOL;  Surgeon: DMilus Banister MD;  Location: [facility];  Service: Endoscopy;  Laterality: N/A;  . HOLMIUM LASER APPLICATION Right 93/00/9233  Procedure: HOLMIUM LASER APPLICATION;  Surgeon: TAlexis Frock MD;  [facility]  Service: Urology;  Laterality: Right;  . LIVER BIOPSY    . OTHER SURGICAL HISTORY Right    removal of ovarian cyst  . removal of uterine cyst     years ago  . RIGHT VATS W/ DRAINAGE PEURAL EFFUSION AND BX'S  10-30-2008    I have reviewed the social  history and family history with the patient and they are unchanged from previous note.  ALLERGIES:  has No Known Allergies.  MEDICATIONS:  Current Outpatient Medications  Medication Sig Dispense Refill  . acyclovir (ZOVIRAX) 800 MG tablet Take 1 tablet (800 mg total) by mouth 2 (two) times daily. 60 tablet 5  . chlorpheniramine-HYDROcodone (TUSSIONEX) 10-8 MG/5ML SUER Take 5 mLs by mouth every 12 (twelve) hours as needed for cough. 140 mL 0  . feeding supplement, ENSURE ENLIVE, (ENSURE ENLIVE) LIQD Take 237 mLs by mouth 2 (two) times daily between meals. 237 mL 12  . ibuprofen (ADVIL,MOTRIN) 200 MG tablet Take 200 mg by mouth every 6 (six) hours as needed for  moderate pain.    Marland Kitchen levofloxacin (LEVAQUIN) 750 MG tablet Take 1 tablet (750 mg total) by mouth daily. 7 tablet 0  . magic mouthwash SOLN Take 5 mLs by mouth 4 (four) times daily as needed for mouth pain. 240 mL 3  . omeprazole (PRILOSEC) 20 MG capsule Take 1 capsule (20 mg total) by mouth daily. 30 capsule 5  . ondansetron (ZOFRAN ODT) 8 MG disintegrating tablet Take 1 tablet (8 mg total) by mouth every 8 (eight) hours as needed for nausea or vomiting. 40 tablet 1  . polyethylene glycol (MIRALAX / GLYCOLAX) packet Take 17 g by mouth daily as needed (constipation). 14 each 1  . pomalidomide (POMALYST) 2 MG capsule Take 1 capsule (2 mg total) by mouth daily. Take with water on days 1-21. Repeat every 28 days. 21 capsule 0  . potassium chloride SA (K-DUR,KLOR-CON) 20 MEQ tablet Take 1 tablet (20 mEq total) by mouth daily. 30 tablet 0  . prochlorperazine (COMPAZINE) 10 MG tablet Take 1 tablet (10 mg total) by mouth every 6 (six) hours as needed for nausea or vomiting. 30 tablet 3  . promethazine (PHENERGAN) 25 MG tablet Take 1 tablet (25 mg total) by mouth every 6 (six) hours as needed for nausea or vomiting. 30 tablet 0  . VIREAD 300 MG tablet Take 1 tablet (300 mg total) by mouth daily. 30 tablet 11   No current facility-administered medications for this visit.    Facility-Administered Medications Ordered in Other Visits  Medication Dose Route Frequency Provider Last Rate Last Dose  . carfilzomib (KYPROLIS) 90 mg in dextrose 5 % 100 mL chemo infusion  56 mg/m2 (Treatment Plan Recorded) Intravenous Once Truitt Merle, MD      . sodium chloride 0.9 % injection 10 mL  10 mL Intravenous PRN Truitt Merle, MD   10 mL at 12/11/15 1532  . sodium chloride 0.9 % injection 10 mL  10 mL Intravenous PRN Truitt Merle, MD   10 mL at 06/11/16 1505  . sodium chloride 0.9 % injection 10 mL  10 mL Intravenous PRN Truitt Merle, MD   10 mL at 08/04/17 1643  . sodium chloride flush (NS) 0.9 % injection 10 mL  10 mL Intravenous PRN  Truitt Merle, MD   10 mL at 10/09/15 1717    PHYSICAL EXAMINATION: ECOG PERFORMANCE STATUS: 1 - Symptomatic but completely ambulatory  Vitals:   01/21/18 0822  BP: 111/69  Pulse: 65  Resp: 18  Temp: 97.9 F (36.6 C)  SpO2: 100%   Filed Weights   01/21/18 0822  Weight: 132 lb 3.2 oz (60 kg)    GENERAL:alert, no distress and comfortable SKIN: skin color, texture, turgor are normal, no rashes or significant lesions EYES: normal, Conjunctiva are pink and non-injected, sclera clear OROPHARYNX:no exudate, no  erythema and lips, buccal mucosa, and tongue normal  NECK: supple, thyroid normal size, non-tender, without nodularity LYMPH:  no palpable lymphadenopathy in the cervical, axillary or inguinal LUNGS: clear to auscultation and percussion with normal breathing effort HEART: regular rate & rhythm and no murmurs and no lower extremity edema ABDOMEN:abdomen soft, non-tender and normal bowel sounds Musculoskeletal:no cyanosis of digits and no clubbing  NEURO: alert & oriented x 3 with fluent speech, no focal motor/sensory deficits  LABORATORY DATA:  I have reviewed the data as listed CBC Latest Ref Rng & Units 01/21/2018 01/13/2018 12/30/2017  WBC 4.0 - 10.5 K/uL 3.3(L) 4.9 4.5  Hemoglobin 12.0 - 15.0 g/dL 10.7(L) 10.4(L) 10.6(L)  Hematocrit 36.0 - 46.0 % 34.1(L) 33.2(L) 33.7(L)  Platelets 150 - 400 K/uL 171 257 261     CMP Latest Ref Rng & Units 01/21/2018 01/13/2018 12/30/2017  Glucose 70 - 99 mg/dL 114(H) 87 72  BUN 6 - 20 mg/dL _0 Creatinine 0.44 - 1.00 mg/dL 0.79 0.74 0.68  Sodium 135 - 145 mmol/L 142 142 143  Potassium 3.5 - 5.1 mmol/L 3.6 3.5 3.7  Chloride 98 - 111 mmol/L 108 109 109  CO2 22 - 32 mmol/L _1 Calcium 8.9 - 10.3 mg/dL 9.4 9.3 9.4  Total Protein 6.5 - 8.1 g/dL 7.1 7.3 7.0  Total Bilirubin 0.3 - 1.2 mg/dL 0.4 0.3 0.2(L)  Alkaline Phos 38 - 126 U/L 71 74 93  AST 15 - 41 U/L _2 ALT 0 - 44 U/L <6 <6 7      RADIOGRAPHIC STUDIES: I  have personally reviewed the radiological images as listed and agreed with the findings in the report. No results found.   ASSESSMENT & PLAN:  Sheryl [name] is a 47 y.o. female with   1. 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 She was treated with CyborD and bone marrow transplant. She progressed on maintenance Velcade in 02/2016. She was treated with more Dara and CyborD, which was stopped after a severe Hep B flare. -She is currently being treated with Carfilzomib and Pomalyst.  She is tolerating well overall. -Labs reviewed, Hg at 10.7, CBC otherwise normal. CMP still pending.  She has recovered well from her recent URI, overall adequate to proceed with treatment today.  -She still has not received her latest refill of Pomalyst. I will look into this. She will start as soon as she receives it  -F/u in 4 weeks    2.  Acute bronchitis versus pneumonia  -Her 01/13/18 chest Xray showed atelectasis or developing pneumonia in the right middle lobe. This was seen on prior Xray before, likely chronic.  -She is s/p a course of Augmentin. Her sore throat resolved and her cough has much improved.  no fever -Exam was adequate to continue with treatment today  -Will repeat Xray in 1 month to follow up.     3. Chronic hepatitis B infection -She has known chronic hepatitis B infection, developed significant transaminitis and hyperbilirubinemia in January 2019 -She restart her Heb B treatment in 02/2017 -She will continue to be closely followed by ID Dr. Baxter Flattery  4. Type 2 diabetes, steroids induced  -Treated with Metformin. -She has been off dexamethasone, not on metformin now.   5. GERD, history of GI bleeding and nausea  -Colonoscopy on 11/26/16 per Dr. Ardis Hughs notable for a polyp in the transverse colon, revealed to be tubular adenoma and negative  for high grade dysplasia or malignancy.  -Continue omeprazole daily. We previously discussed steroids  can worsen her acid reflux   6. Body pain -She has been having mild generalized body, especially in her thighs, since she started chemo. It seems to be worse when she switched to current regiment.  -She is not on pain medication.  -Improved   7. Headache -Her MRI was negative  -she was previously referred to neurologist Dr. Mickeal Skinner -She states her headaches has near resolved now, will cancel her referral to Dr. Marye Round.   Plan -Labs reviewed and URI improved and adequate to proceed with treatment today and continue every 2 weeks -my nurse call her specialty pharmacy regarding her Pomalyst refill -lab, flush, f/u and treatment in 4 weeks, will get a repeated chest x-ray on next visit   No problem-specific Assessment & Plan notes found for this encounter.   No orders of the defined types were placed in this encounter.  All questions were answered. The patient knows to call the clinic with any problems, questions or concerns. No barriers to learning was detected. I spent 15 minutes counseling the patient face to face. The total time spent in the appointment was 20 minutes and more than 50% was on counseling and review of test results     Truitt Merle, MD 01/21/2018   I, Joslyn Devon, am acting as scribe for Truitt Merle, MD.   I have reviewed the above documentation for accuracy and completeness, and I agree with the above.

## 2018-01-22 ENCOUNTER — Telehealth: Payer: Self-pay | Admitting: Hematology

## 2018-01-22 NOTE — Telephone Encounter (Signed)
Per 12/19 no los

## 2018-01-22 NOTE — Telephone Encounter (Signed)
Spoke with the patient daughter about upcoming treatment that was scheduled for Dec 30.

## 2018-01-26 ENCOUNTER — Other Ambulatory Visit: Payer: Self-pay

## 2018-01-26 ENCOUNTER — Ambulatory Visit: Payer: Self-pay | Admitting: Hematology

## 2018-01-26 ENCOUNTER — Ambulatory Visit: Payer: Self-pay

## 2018-02-02 ENCOUNTER — Inpatient Hospital Stay: Payer: Medicaid Other

## 2018-02-02 VITALS — BP 136/86 | HR 83 | Temp 101.1°F | Resp 16

## 2018-02-02 DIAGNOSIS — J09X2 Influenza due to identified novel influenza A virus with other respiratory manifestations: Secondary | ICD-10-CM

## 2018-02-02 DIAGNOSIS — C9012 Plasma cell leukemia in relapse: Secondary | ICD-10-CM

## 2018-02-02 DIAGNOSIS — C9010 Plasma cell leukemia not having achieved remission: Secondary | ICD-10-CM

## 2018-02-02 DIAGNOSIS — Z5112 Encounter for antineoplastic immunotherapy: Secondary | ICD-10-CM | POA: Diagnosis not present

## 2018-02-02 LAB — CBC WITH DIFFERENTIAL (CANCER CENTER ONLY)
Abs Immature Granulocytes: 0.03 10*3/uL (ref 0.00–0.07)
Basophils Absolute: 0 10*3/uL (ref 0.0–0.1)
Basophils Relative: 0 %
EOS PCT: 0 %
Eosinophils Absolute: 0 10*3/uL (ref 0.0–0.5)
HCT: 33 % — ABNORMAL LOW (ref 36.0–46.0)
HEMOGLOBIN: 10.8 g/dL — AB (ref 12.0–15.0)
Immature Granulocytes: 0 %
Lymphocytes Relative: 13 %
Lymphs Abs: 1 10*3/uL (ref 0.7–4.0)
MCH: 31 pg (ref 26.0–34.0)
MCHC: 32.7 g/dL (ref 30.0–36.0)
MCV: 94.8 fL (ref 80.0–100.0)
Monocytes Absolute: 0.5 10*3/uL (ref 0.1–1.0)
Monocytes Relative: 7 %
Neutro Abs: 6 10*3/uL (ref 1.7–7.7)
Neutrophils Relative %: 80 %
Platelet Count: 192 10*3/uL (ref 150–400)
RBC: 3.48 MIL/uL — ABNORMAL LOW (ref 3.87–5.11)
RDW: 13.3 % (ref 11.5–15.5)
WBC Count: 7.5 10*3/uL (ref 4.0–10.5)
nRBC: 0 % (ref 0.0–0.2)

## 2018-02-02 LAB — CMP (CANCER CENTER ONLY)
ALT: <6 U/L (ref 0–44)
AST: 22 U/L (ref 15–41)
Albumin: 3.5 g/dL (ref 3.5–5.0)
Alkaline Phosphatase: 65 U/L (ref 38–126)
Anion gap: 12 (ref 5–15)
BUN: 15 mg/dL (ref 6–20)
CO2: 22 mmol/L (ref 22–32)
Calcium: 9.3 mg/dL (ref 8.9–10.3)
Chloride: 107 mmol/L (ref 98–111)
Creatinine: 0.76 mg/dL (ref 0.44–1.00)
GFR, Est AFR Am: >60 mL/min (ref >60)
GFR, Estimated: >60 mL/min (ref >60)
Glucose, Bld: 81 mg/dL (ref 70–99)
Potassium: 3.6 mmol/L (ref 3.5–5.1)
Sodium: 141 mmol/L (ref 135–145)
Total Bilirubin: 0.7 mg/dL (ref 0.3–1.2)
Total Protein: 7.3 g/dL (ref 6.5–8.1)

## 2018-02-02 LAB — PREGNANCY, URINE: Preg Test, Ur: NEGATIVE

## 2018-02-02 LAB — INFLUENZA PANEL BY PCR (TYPE A & B)
Influenza A By PCR: NEGATIVE
Influenza B By PCR: NEGATIVE

## 2018-02-02 MED ORDER — HEPARIN SOD (PORK) LOCK FLUSH 100 UNIT/ML IV SOLN
500.0000 [IU] | Freq: Once | INTRAVENOUS | Status: AC
  Administered 2018-02-02: 500 [IU] via INTRAVENOUS
  Filled 2018-02-02: qty 5

## 2018-02-02 MED ORDER — SODIUM CHLORIDE 0.9% FLUSH
10.0000 mL | Freq: Once | INTRAVENOUS | Status: AC
  Administered 2018-02-02: 10 mL via INTRAVENOUS
  Filled 2018-02-02: qty 10

## 2018-02-02 NOTE — Addendum Note (Signed)
Addended by: Lucile Crater on: 02/02/2018 10:30 AM   Modules accepted: Orders

## 2018-02-02 NOTE — Patient Instructions (Signed)
No treatment. Defer to next week. Expect a call from scheduling with new appt information.

## 2018-02-02 NOTE — Progress Notes (Signed)
Pt presents in infusion room with cough, c/o chills through out the night, fever, productive cough and body aches. Will review with  provider

## 2018-02-02 NOTE — Progress Notes (Signed)
Influenza panel came back negative, pt temp 101.1, reviewed with Dr. Benay Spice who advised no treatment, r/s for next week. If symptoms worsen to see PCP or present to ED.Discussed above information with pt via interpretor, pt verbalized understanding. Message to scheduling to r/s today appt

## 2018-02-04 LAB — MULTIPLE MYELOMA PANEL, SERUM
ALBUMIN/GLOB SERPL: 1.1 (ref 0.7–1.7)
Albumin SerPl Elph-Mcnc: 3.4 g/dL (ref 2.9–4.4)
Alpha 1: 0.3 g/dL (ref 0.0–0.4)
Alpha2 Glob SerPl Elph-Mcnc: 0.9 g/dL (ref 0.4–1.0)
B-Globulin SerPl Elph-Mcnc: 0.9 g/dL (ref 0.7–1.3)
GAMMA GLOB SERPL ELPH-MCNC: 1.1 g/dL (ref 0.4–1.8)
Globulin, Total: 3.3 g/dL (ref 2.2–3.9)
IgA: 170 mg/dL (ref 87–352)
IgG (Immunoglobin G), Serum: 1316 mg/dL (ref 700–1600)
IgM (Immunoglobulin M), Srm: 41 mg/dL (ref 26–217)
Total Protein ELP: 6.7 g/dL (ref 6.0–8.5)

## 2018-02-04 LAB — KAPPA/LAMBDA LIGHT CHAINS
Kappa free light chain: 17.8 mg/L (ref 3.3–19.4)
Kappa, lambda light chain ratio: 0.73 (ref 0.26–1.65)
Lambda free light chains: 24.5 mg/L (ref 5.7–26.3)

## 2018-02-08 ENCOUNTER — Telehealth: Payer: Self-pay | Admitting: Hematology

## 2018-02-08 NOTE — Telephone Encounter (Signed)
Scheduled appt per 12/31 sch message - pt daughter is aware of appt date and time

## 2018-02-08 NOTE — Telephone Encounter (Signed)
Left message for patient via [place] #[phone] re 1/8 appointments. Patient will get updates schedule at 1/8 visit.

## 2018-02-10 ENCOUNTER — Inpatient Hospital Stay: Payer: Medicaid Other | Attending: Hematology

## 2018-02-10 ENCOUNTER — Inpatient Hospital Stay: Payer: Medicaid Other

## 2018-02-10 ENCOUNTER — Other Ambulatory Visit: Payer: Self-pay | Admitting: Medical

## 2018-02-10 ENCOUNTER — Other Ambulatory Visit: Payer: Self-pay

## 2018-02-10 ENCOUNTER — Other Ambulatory Visit: Payer: Self-pay | Admitting: Hematology

## 2018-02-10 ENCOUNTER — Ambulatory Visit: Payer: Self-pay

## 2018-02-10 ENCOUNTER — Ambulatory Visit (HOSPITAL_BASED_OUTPATIENT_CLINIC_OR_DEPARTMENT_OTHER): Payer: Self-pay | Admitting: Medical

## 2018-02-10 VITALS — BP 124/67 | HR 88 | Temp 98.8°F | Resp 14

## 2018-02-10 DIAGNOSIS — Z5111 Encounter for antineoplastic chemotherapy: Secondary | ICD-10-CM | POA: Diagnosis not present

## 2018-02-10 DIAGNOSIS — C9012 Plasma cell leukemia in relapse: Secondary | ICD-10-CM | POA: Insufficient documentation

## 2018-02-10 DIAGNOSIS — Z95828 Presence of other vascular implants and grafts: Secondary | ICD-10-CM

## 2018-02-10 DIAGNOSIS — M62838 Other muscle spasm: Secondary | ICD-10-CM

## 2018-02-10 DIAGNOSIS — Z5112 Encounter for antineoplastic immunotherapy: Secondary | ICD-10-CM | POA: Diagnosis present

## 2018-02-10 DIAGNOSIS — C9010 Plasma cell leukemia not having achieved remission: Secondary | ICD-10-CM

## 2018-02-10 LAB — CMP (CANCER CENTER ONLY)
ALBUMIN: 3.2 g/dL — AB (ref 3.5–5.0)
ALT: <6 U/L (ref 0–44)
AST: 17 U/L (ref 15–41)
Alkaline Phosphatase: 66 U/L (ref 38–126)
Anion gap: 9 (ref 5–15)
BILIRUBIN TOTAL: 0.2 mg/dL — AB (ref 0.3–1.2)
BUN: 13 mg/dL (ref 6–20)
CO2: 28 mmol/L (ref 22–32)
Calcium: 9.8 mg/dL (ref 8.9–10.3)
Chloride: 106 mmol/L (ref 98–111)
Creatinine: 0.7 mg/dL (ref 0.44–1.00)
GFR, Est AFR Am: >60 mL/min (ref >60)
GFR, Estimated: >60 mL/min (ref >60)
GLUCOSE: 136 mg/dL — AB (ref 70–99)
Potassium: 3.1 mmol/L — ABNORMAL LOW (ref 3.5–5.1)
Sodium: 143 mmol/L (ref 135–145)
Total Protein: 7 g/dL (ref 6.5–8.1)

## 2018-02-10 LAB — CBC WITH DIFFERENTIAL (CANCER CENTER ONLY)
Abs Immature Granulocytes: 0.04 10*3/uL (ref 0.00–0.07)
Basophils Absolute: 0 10*3/uL (ref 0.0–0.1)
Basophils Relative: 0 %
Eosinophils Absolute: 0.1 10*3/uL (ref 0.0–0.5)
Eosinophils Relative: 1 %
HCT: 32.1 % — ABNORMAL LOW (ref 36.0–46.0)
Hemoglobin: 10.1 g/dL — ABNORMAL LOW (ref 12.0–15.0)
Immature Granulocytes: 1 %
Lymphocytes Relative: 23 %
Lymphs Abs: 1 10*3/uL (ref 0.7–4.0)
MCH: 30.3 pg (ref 26.0–34.0)
MCHC: 31.5 g/dL (ref 30.0–36.0)
MCV: 96.4 fL (ref 80.0–100.0)
Monocytes Absolute: 0.5 10*3/uL (ref 0.1–1.0)
Monocytes Relative: 11 %
Neutro Abs: 2.8 10*3/uL (ref 1.7–7.7)
Neutrophils Relative %: 64 %
PLATELETS: 220 10*3/uL (ref 150–400)
RBC: 3.33 MIL/uL — ABNORMAL LOW (ref 3.87–5.11)
RDW: 13.2 % (ref 11.5–15.5)
WBC Count: 4.4 10*3/uL (ref 4.0–10.5)
nRBC: 0 % (ref 0.0–0.2)

## 2018-02-10 MED ORDER — DEXAMETHASONE SODIUM PHOSPHATE 10 MG/ML IJ SOLN
INTRAMUSCULAR | Status: AC
  Filled 2018-02-10: qty 1

## 2018-02-10 MED ORDER — PROCHLORPERAZINE MALEATE 10 MG PO TABS
ORAL_TABLET | ORAL | Status: AC
  Filled 2018-02-10: qty 1

## 2018-02-10 MED ORDER — HEPARIN SOD (PORK) LOCK FLUSH 100 UNIT/ML IV SOLN
500.0000 [IU] | Freq: Once | INTRAVENOUS | Status: AC
  Administered 2018-02-10: 500 [IU] via INTRAVENOUS
  Filled 2018-02-10: qty 5

## 2018-02-10 MED ORDER — PROCHLORPERAZINE MALEATE 10 MG PO TABS
10.0000 mg | ORAL_TABLET | Freq: Once | ORAL | Status: AC
  Administered 2018-02-10: 10 mg via ORAL

## 2018-02-10 MED ORDER — DEXTROSE 5 % IV SOLN
56.0000 mg/m2 | Freq: Once | INTRAVENOUS | Status: AC
  Administered 2018-02-10: 90 mg via INTRAVENOUS
  Filled 2018-02-10: qty 30

## 2018-02-10 MED ORDER — PALONOSETRON HCL INJECTION 0.25 MG/5ML
INTRAVENOUS | Status: AC
  Filled 2018-02-10: qty 5

## 2018-02-10 MED ORDER — HEPARIN SOD (PORK) LOCK FLUSH 100 UNIT/ML IV SOLN
500.0000 [IU] | Freq: Once | INTRAVENOUS | Status: AC | PRN
  Administered 2018-02-10: 500 [IU]
  Filled 2018-02-10: qty 5

## 2018-02-10 MED ORDER — DEXAMETHASONE SODIUM PHOSPHATE 10 MG/ML IJ SOLN
8.0000 mg | Freq: Once | INTRAMUSCULAR | Status: AC
  Administered 2018-02-10: 8 mg via INTRAVENOUS

## 2018-02-10 MED ORDER — PALONOSETRON HCL INJECTION 0.25 MG/5ML
0.2500 mg | Freq: Once | INTRAVENOUS | Status: AC
  Administered 2018-02-10: 0.25 mg via INTRAVENOUS

## 2018-02-10 MED ORDER — METHOCARBAMOL 500 MG PO TABS: 500.0000 mg | ORAL_TABLET | Freq: Four times a day (QID) | ORAL | 0 refills | Status: DC | PRN

## 2018-02-10 MED ORDER — POTASSIUM CHLORIDE 20 MEQ PO PACK: 20.0000 meq | PACK | Freq: Two times a day (BID) | ORAL | 0 refills | Status: DC

## 2018-02-10 MED ORDER — SODIUM CHLORIDE 0.9% FLUSH
10.0000 mL | INTRAVENOUS | Status: DC | PRN
  Administered 2018-02-10: 10 mL via INTRAVENOUS
  Filled 2018-02-10: qty 10

## 2018-02-10 MED ORDER — SODIUM CHLORIDE 0.9% FLUSH
10.0000 mL | INTRAVENOUS | Status: DC | PRN
  Administered 2018-02-10: 10 mL
  Filled 2018-02-10: qty 10

## 2018-02-10 MED ORDER — SODIUM CHLORIDE 0.9% FLUSH
10.0000 mL | INTRAVENOUS | Status: DC | PRN
  Filled 2018-02-10: qty 10

## 2018-02-10 MED ORDER — SODIUM CHLORIDE 0.9 % IV SOLN
Freq: Once | INTRAVENOUS | Status: AC
  Administered 2018-02-10: 13:00:00 via INTRAVENOUS
  Filled 2018-02-10: qty 250

## 2018-02-10 MED ORDER — SODIUM CHLORIDE 0.9 % IV SOLN
Freq: Once | INTRAVENOUS | Status: DC
  Filled 2018-02-10: qty 250

## 2018-02-10 MED FILL — OMEPRAZOLE 20 MG CPDR: 20 | 30 days supply | Qty: 30 | Fill #1

## 2018-02-10 MED FILL — ACYCLOVIR 800 MG TABLET: 800 | 30 days supply | Qty: 60 | Fill #2

## 2018-02-10 MED FILL — TENOFOVIR DISOPROXIL FUMARA: 300 | 30 days supply | Qty: 30 | Fill #0

## 2018-02-10 MED FILL — METHOCARBAMOL 500 MG TABLET: 500 | 10 days supply | Qty: 40 | Fill #0

## 2018-02-10 NOTE — Patient Instructions (Signed)
[facility] Discharge Instructions for Patients Receiving Chemotherapy  Today you received the following chemotherapy agents: Carfilzomib (Kyprolis)  To help prevent nausea and vomiting after your treatment, we encourage you to take your nausea medication as directed. Received Aloxi during treatment today-->Take Compazine (not Zofran) for the next 3 days as needed.    If you develop nausea and vomiting that is not controlled by your nausea medication, call the clinic.   BELOW ARE SYMPTOMS THAT SHOULD BE REPORTED IMMEDIATELY:  *FEVER GREATER THAN 100.5 F  *CHILLS WITH OR WITHOUT FEVER  NAUSEA AND VOMITING THAT IS NOT CONTROLLED WITH YOUR NAUSEA MEDICATION  *UNUSUAL SHORTNESS OF BREATH  *UNUSUAL BRUISING OR BLEEDING  TENDERNESS IN MOUTH AND THROAT WITH OR WITHOUT PRESENCE OF ULCERS  *URINARY PROBLEMS  *BOWEL PROBLEMS  UNUSUAL RASH Items with * indicate a potential emergency and should be followed up as soon as possible.  Feel free to call the clinic should you have any questions or concerns. The clinic phone number is [phone].  Please show the [place] at check-in to the Emergency Department and triage nurse.

## 2018-02-12 NOTE — Progress Notes (Signed)
[facility] Progress Note   Sheryl [name] [id number] 1970/11/02 48 y.o.  Sheryl [name] is managed by Dr. 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 Burr Medico and is receiving cycle 11, day 1 of Carfilzomib and Pomalyst today.  She will be seen in follow-up and for consideration of her next cycle of chemotherapy on 02/17/2018.  Muscle spasms: The patient's exam shows muscle spasms in her left mid scapula and left anterior shoulder.  She was given a prescription for Robaxin 500 mg p.o. 4 times daily as needed.  Please see After Visit Summary for patient specific instructions.  Future Appointments  Date Time Provider [place]  02/17/2018  7:45 AM [facility] LAB 3 [facility] None  02/17/2018  8:00 AM [facility] [place] FLUSH [facility] None  02/17/2018  8:15 AM Truitt Merle, MD [facility] None  02/17/2018  8:30 AM [facility] INFUSION [facility] None    No orders of the defined types were placed in this encounter.      Subjective:   Patient ID:  Sheryl [name] is a 48 y.o. (DOB 1971/02/01) female.  Chief Complaint: No chief complaint on file.   HPI Sheryl [name] is a 48 year old female with a history of a plasma cell leukemia in relapse. She continues to be managed by Dr. Burr Medico and is receiving cycle 11, day 1 of Carfilzomib and Pomalyst today.  She was seen in the infusion room today with a report that she was having pain in her left mid scapula and left anterior shoulder.  She has had no change in activity and has had no trauma.    Medications: I have reviewed the patient's current medications.  Allergies: No Known Allergies  Past Medical History:  Diagnosis Date  . Chills with fever    intermittently  since d/c from hospital  . Dysuria-frequency syndrome    w/ pink urine  . GERD (gastroesophageal reflux disease)   . Hepatitis   . History of positive PPD    DX 2011--  CXR DONE NO EVIDENCE  . History of ureter stent   . Hydronephrosis, right   . Neuromuscular disorder (HCC)    legs numb intermittently  . 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[place])   . Pneumonia   . Right ureteral stone   . Urosepsis 8/14   admitted to [facility]    Past Surgical History:  Procedure Laterality Date  . CYSTOSCOPY W/ URETERAL STENT PLACEMENT Right 09/25/2012   Procedure: CYSTOSCOPY WITH RETROGRADE PYELOGRAM/URETERAL STENT PLACEMENT;  Surgeon: Alexis Frock, MD;  Location: [facility];  Service: Urology;  Laterality: Right;  . CYSTOSCOPY WITH RETROGRADE PYELOGRAM, URETEROSCOPY AND STENT PLACEMENT Right 10/15/2012   Procedure: CYSTOSCOPY WITH RETROGRADE PYELOGRAM, URETEROSCOPY AND REMOVAL STENT WITH  STENT PLACEMENT;  Surgeon: Alexis Frock, MD;  Location: [facility];  Service: Urology;  Laterality: Right;  . ESOPHAGOGASTRODUODENOSCOPY (EGD) WITH PROPOFOL N/A 11/16/2014   Procedure: ESOPHAGOGASTRODUODENOSCOPY (EGD) WITH PROPOFOL;  Surgeon: Milus Banister, MD;  Location: [facility];  Service: Endoscopy;  Laterality: N/A;  . HOLMIUM LASER APPLICATION Right 9/62/2297   Procedure: HOLMIUM LASER APPLICATION;  Surgeon: Alexis Frock, MD;  Location[facility];  Service: Urology;  Laterality: Right;  . LIVER BIOPSY    . OTHER SURGICAL HISTORY Right  removal of ovarian cyst  . removal of uterine cyst     years ago  . RIGHT VATS W/ DRAINAGE PEURAL EFFUSION AND BX'S  10-30-2008    Family History  Problem Relation Age of Onset  . Stomach cancer Mother   . Lung disease Father   . Asthma Father     Social History   Socioeconomic History  . Marital status: Single    Spouse name: Not on file  . Number of children: 3  . Years of education: Not on file  . Highest education level: Not on file    Occupational History  . Not on file  Social Needs  . Financial resource strain: Not on file  . Food insecurity:    Worry: Not on file    Inability: Not on file  . Transportation needs:    Medical: Not on file    Non-medical: Not on file  Tobacco Use  . Smoking status: Never Smoker  . Smokeless tobacco: Never Used  Substance and Sexual Activity  . Alcohol use: No    Alcohol/week: 0.0 standard drinks  . Drug use: No  . Sexual activity: Not Currently    Birth control/protection: Abstinence  Lifestyle  . Physical activity:    Days per week: Not on file    Minutes per session: Not on file  . Stress: Not on file  Relationships  . Social connections:    Talks on phone: Not on file    Gets together: Not on file    Attends religious service: Not on file    Active member of club or organization: Not on file    Attends meetings of clubs or organizations: Not on file    Relationship status: Not on file  . Intimate partner violence:    Fear of current or ex partner: Not on file    Emotionally abused: Not on file    Physically abused: Not on file    Forced sexual activity: Not on file  Other Topics Concern  . Not on file  Social History Narrative  . Not on file    Past Medical History, Surgical history, Social history, and Family history were reviewed and updated as appropriate.   Please see review of systems for further details on the patient's review from today.   Review of Systems:  Review of Systems  Constitutional: Negative for activity change, chills, diaphoresis and fever.  Musculoskeletal: Positive for myalgias.    Objective:   Physical Exam:  There were no vitals taken for this visit. ECOG: 0  Physical Exam Constitutional:      General: She is not in acute distress.    Appearance: She is not diaphoretic.  HENT:     Head: Normocephalic and atraumatic.  Cardiovascular:     Rate and Rhythm: Normal rate and regular rhythm.     Heart sounds: Normal heart  sounds. No murmur. No friction rub. No gallop.   Pulmonary:     Effort: Pulmonary effort is normal. No respiratory distress.     Breath sounds: Normal breath sounds. No wheezing or rales.  Musculoskeletal: Normal range of motion.        General: Tenderness (Tenderness and an area of muscle spasms noted in the left mid scapula and left superior biceps. ) present. No swelling, deformity or signs of injury.  Skin:    General: Skin is warm and dry.     Findings: No erythema or rash.  Neurological:     General: No  focal deficit present.     Mental Status: She is alert.  Psychiatric:        Mood and Affect: Mood normal.        Behavior: Behavior normal.        Thought Content: Thought content normal.        Judgment: Judgment normal.     Lab Review:     Component Value Date/Time   NA 143 02/10/2018 1207   NA 140 02/02/2017 1317   K 3.1 (L) 02/10/2018 1207   K 4.0 02/02/2017 1317   CL 106 02/10/2018 1207   CO2 28 02/10/2018 1207   CO2 25 02/02/2017 1317   GLUCOSE 136 (H) 02/10/2018 1207   GLUCOSE 121 02/02/2017 1317   BUN 13 02/10/2018 1207   BUN 13.8 02/02/2017 1317   CREATININE 0.70 02/10/2018 1207   CREATININE 0.72 03/03/2017 1533   CREATININE 0.8 02/02/2017 1317   CALCIUM 9.8 02/10/2018 1207   CALCIUM 9.3 02/02/2017 1317   PROT 7.0 02/10/2018 1207   PROT 6.9 02/02/2017 1317   ALBUMIN 3.2 (L) 02/10/2018 1207   ALBUMIN 3.5 02/02/2017 1317   AST 17 02/10/2018 1207   AST 47 (H) 02/02/2017 1317   ALT <6 02/10/2018 1207   ALT 23 02/02/2017 1317   ALKPHOS 66 02/10/2018 1207   ALKPHOS 98 02/02/2017 1317   BILITOT 0.2 (L) 02/10/2018 1207   BILITOT <0.22 02/02/2017 1317   GFRNONAA >60 02/10/2018 1207   GFRAA >60 02/10/2018 1207       Component Value Date/Time   WBC 4.4 02/10/2018 1207   WBC 5.0 08/29/2017 0928   RBC 3.33 (L) 02/10/2018 1207   HGB 10.1 (L) 02/10/2018 1207   HGB 10.4 (L) 02/02/2017 1317   HCT 32.1 (L) 02/10/2018 1207   HCT 33.9 (L) 02/02/2017 1317    PLT 220 02/10/2018 1207   PLT 172 02/02/2017 1317   MCV 96.4 02/10/2018 1207   MCV 87.6 02/02/2017 1317   MCH 30.3 02/10/2018 1207   MCHC 31.5 02/10/2018 1207   RDW 13.2 02/10/2018 1207   RDW 14.1 02/02/2017 1317   LYMPHSABS 1.0 02/10/2018 1207   LYMPHSABS 0.7 (L) 02/02/2017 1317   MONOABS 0.5 02/10/2018 1207   MONOABS 0.1 02/02/2017 1317   EOSABS 0.1 02/10/2018 1207   EOSABS 0.0 02/02/2017 1317   BASOSABS 0.0 02/10/2018 1207   BASOSABS 0.0 02/02/2017 1317   -------------------------------  Imaging from last 24 hours (if applicable):  Radiology interpretation: No results found.

## 2018-02-15 ENCOUNTER — Telehealth: Payer: Self-pay | Admitting: Hematology

## 2018-02-15 NOTE — Telephone Encounter (Signed)
R/s apt per 1/13 sch message - pt daughter is aware of apt date and time

## 2018-02-17 ENCOUNTER — Ambulatory Visit: Payer: Self-pay | Admitting: Hematology

## 2018-02-17 ENCOUNTER — Other Ambulatory Visit: Payer: Self-pay

## 2018-02-17 ENCOUNTER — Ambulatory Visit: Payer: Self-pay

## 2018-02-22 NOTE — Progress Notes (Signed)
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    10/10/2014 Miscellaneous    Peripheral blood chemistry and leukocytosis with total white count 78K, comprised of large plasma cells and his normocytic anemia. There is a myeloid left shift with previous surgical radium blasts. Flow cytometry showed 64% plasma cells    10/10/2014 Bone Marrow Biopsy    Markedly hypercellular marrow (95%), Atypical plasma cells comprise 57% of the cellularity. There was diminished multilineage in hematopoiesis with adequate maturation. Breasts less than 1%), no overt dysplasia of the myeloid or erythroid lineages.     10/10/2014 Initial Diagnosis    Plasma cell leukemia    10/13/2014 - 02/22/2015 Chemotherapy    CyborD (cytoxan '300mg'$ /m2 iv, bortezomib 1.5 mg/m, dexamethasone 40 mg, weekly every 28 days, bortezomib and dexamethasone was given twice weekly for 2 weeks during the first cycle)    04/04/2015 Bone Marrow Transplant    autologous stem cell transplant at [facility]. Her transplant course was complicated by sepsis from Escherichia coli bacteremia and associated colitis, she was discharged home on 04/27/2015.    05/07/2015 - 05/12/2015 Hospital Admission    patient was admitted to [facility] for fever, tachycardia, nausea and abdominal pain. ID  workup was negative, EGD showed evidence of gastritis and duodenitis, no H. pylori or CMV.    05/20/2015 - 05/24/2015 Hospital Admission    patient was admitted to [facility] for sepsis from Escherichia coli UTI.    07/11/2015 Bone Marrow Biopsy    Post transplant 100 a bone marrow biopsy showed hypocellular marrow, 20%, no increase in plasma cells (2%) or other abnormalities.    08/22/2015 - 01/02/2016 Chemotherapy    MaintenanceCyborD (cytoxan '300mg'$ /m2 iv, bortezomib 1.5 mg/m, dexamethasone 40 mg, every 2 weeks, changed to Velcade maintenance after 4 months treatment    01/16/2016 - 04/19/2016 Chemotherapy    Maintenance Velcade 1.3 mg/m every 2 weeks    02/22/2016 Pathology Results    BONE MARROW: Diagnosis Bone Marrow, Aspirate,Biopsy, and Clot, right iliac - NORMOCELLULAR BONE MARROW FOR AGE WITH TRILINEAGE HEMATOPOIESIS. - PLASMACYTOSIS (PLASMA CELLS 12%). - SEE COMMENT. PERIPHERAL BLOOD: - OCCASIONAL CIRCULATING PLASMA CELLS.    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     04/18/2016 - 03/26/2017 Chemotherapy    Daratumumab per protocol  CyBorD every week, cytoxan was held after 04/24/2016 dye to cytopenia and infection  -discontinued due to Hep B flare    05/11/2016 - 05/16/2016 Hospital Admission    Healthcare-associated pneumonia    02/16/2017 - 02/21/2017 Hospital Admission    Admission diagnosis: Abnormal LFT's Additional comments: Assoc diagnoses: Hep B flare, transminitis, dehydration, fever, SIRS    04/06/2017 -  Chemotherapy    Carfilzomib 56 mg/m2 on days 1 and 15 (except '20mg'$ /m2 on C1D1 and C1D2) with dexamethasone 20 mg on same days and pomalidomide 2 mg on days 1-21  of 28 day cycle        08/26/2017 - 08/28/2017 Hospital Admission    Admit date: 08/26/2017 Admission diagnosis: RLL Pneumonia       CURRENT THERAPY:  Carfilzomib 56 mg/m2 on days 1 and 15(except '20mg'$ /m2 on C1D1 and C1D2)with dexamethasone 20  mg on same days and pomalidomide 2 mg on days 1-21 of 28 day cycleon 04/13/17, dexa stopped on 07/06/2017 due to her CR Pt has bene off Pomalyst since since late Nov due to communication issue with pharmacy  INTERVAL HISTORY:  Sheryl [name] is here for a follow up and treatment. She presents to the clinic today with her translator. She notes for the last 5 weeks she has had stabbing neck pain on the left side that radiates to her shoulder and arm which she feels down to her bone. It has gotten worse in the last 2 weeks.  She has mild limited ROM in left shoulder.  She notes her left neck and arm are tender. She notes this pain is constant. She notes she can only work for a few hours at a time. She notes she has never had this pain before an denies injury to her neck. She rather see a neurologist in [place], Dr. Mickeal [name].  She notes she would like pain medication for this as she has none at home.  She notes she was not receiving her Pomalyst for 2 months. She was supposed to receive it by mail, not be pick up.      REVIEW OF SYSTEMS:   Constitutional: Denies fevers, chills or abnormal weight loss Eyes: Denies blurriness of vision Ears, nose, mouth, throat, and face: Denies mucositis or sore throat Respiratory: Denies cough, dyspnea or wheezes Cardiovascular: Denies palpitation, chest discomfort or lower extremity swelling Gastrointestinal:  Denies nausea, heartburn or change in bowel habits Skin: Denies abnormal skin rashes Lymphatics: Denies new lymphadenopathy or easy bruising Neurological:Denies numbness, tingling or new weaknesses Behavioral/Psych: Mood is stable, no new changes  All other systems were reviewed with the patient and are negative.  MEDICAL HISTORY:  Past Medical History:  Diagnosis Date  . Chills with fever    intermittently since d/c from hospital  . Dysuria-frequency syndrome    w/ pink urine  . GERD (gastroesophageal reflux disease)   . Hepatitis   . History of positive PPD      DX 2011--  CXR DONE NO EVIDENCE  . History of ureter stent   . Hydronephrosis, right   . Neuromuscular disorder (HCC)    legs numb intermittently  . 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[place])   . Pneumonia   . Right ureteral stone   . Urosepsis 8/14   admitted to [facility]    SURGICAL HISTORY: Past Surgical History:  Procedure Laterality Date  . CYSTOSCOPY W/ URETERAL STENT PLACEMENT Right 09/25/2012   Procedure: CYSTOSCOPY WITH RETROGRADE PYELOGRAM/URETERAL STENT PLACEMENT;  Surgeon: Alexis Frock, MD;  Location: [facility];  Service: Urology;  Laterality: Right;  . CYSTOSCOPY WITH RETROGRADE PYELOGRAM, URETEROSCOPY AND STENT PLACEMENT Right 10/15/2012   Procedure: CYSTOSCOPY WITH RETROGRADE PYELOGRAM, URETEROSCOPY AND REMOVAL STENT WITH  STENT PLACEMENT;  Surgeon: Alexis Frock, MD;  [facility];  Service: Urology;  Laterality: Right;  . ESOPHAGOGASTRODUODENOSCOPY (EGD) WITH PROPOFOL N/A 11/16/2014   Procedure: ESOPHAGOGASTRODUODENOSCOPY (EGD) WITH PROPOFOL;  Surgeon: Milus Banister, MD;  Location: [facility];  Service: Endoscopy;  Laterality: N/A;  . HOLMIUM LASER APPLICATION Right 8/56/3149   Procedure: HOLMIUM LASER APPLICATION;  Surgeon: Alexis Frock, MD;  Location: [facility];  Service: Urology;  Laterality: Right;  . LIVER BIOPSY    . OTHER SURGICAL HISTORY Right    removal of ovarian cyst  . removal of uterine cyst     years ago  . RIGHT VATS W/ DRAINAGE PEURAL EFFUSION AND BX'S  10-30-2008    I have reviewed the social history and family history with the patient and they are unchanged from previous note.  ALLERGIES:  has No Known Allergies.  MEDICATIONS:  Current Outpatient Medications  Medication Sig Dispense Refill  . acyclovir (ZOVIRAX) 800 MG tablet Take 1 tablet (800 mg total) by mouth 2 (two) times daily. 60 tablet 5  . methocarbamol (ROBAXIN) 500 MG tablet Take 1 tablet (500 mg total) by mouth every 6 (six) hours as needed for muscle  spasms. 40 tablet 0  . omeprazole (PRILOSEC) 20 MG capsule Take 1 capsule (20 mg total) by mouth daily. 30 capsule 5  . VIREAD 300 MG tablet Take 1 tablet (300 mg total) by mouth daily. 30 tablet 11  . chlorpheniramine-HYDROcodone (TUSSIONEX) 10-8 MG/5ML SUER Take 5 mLs by mouth every 12 (twelve) hours as needed for cough. (Patient not taking: Reported on 02/24/2018) 140 mL 0  . feeding supplement, ENSURE ENLIVE, (ENSURE ENLIVE) LIQD Take 237 mLs by mouth 2 (two) times daily between meals. (Patient not taking: Reported on 02/24/2018) 237 mL 12  . ibuprofen (ADVIL,MOTRIN) 200 MG tablet Take 200 mg by mouth every 6 (six) hours as needed for moderate pain.    Marland Kitchen levofloxacin (LEVAQUIN) 750 MG tablet Take 1 tablet (750 mg total) by mouth daily. (Patient not taking: Reported on 02/24/2018) 7 tablet 0  . magic mouthwash SOLN Take 5 mLs by mouth 4 (four) times daily as needed for mouth pain. (Patient not taking: Reported on 02/24/2018) 240 mL 3  . ondansetron (ZOFRAN ODT) 8 MG disintegrating tablet Take 1 tablet (8 mg total) by mouth every 8 (eight) hours as needed for nausea or vomiting. (Patient not taking: Reported on 02/24/2018) 40 tablet 1  . polyethylene glycol (MIRALAX / GLYCOLAX) packet Take 17 g by mouth daily as needed (constipation). (Patient not taking: Reported on 02/24/2018) 14 each 1  . pomalidomide (POMALYST) 2 MG capsule Take 1 capsule (2 mg total) by mouth daily. Take with water on days 1-21. Repeat every 28 days. (Patient not taking: Reported on 02/24/2018) 21 capsule 0  . potassium chloride (KLOR-CON) 20 MEQ packet Take 20 mEq by mouth 2 (two) times daily. 1 pack twice daily for 3 days then 1 pack daily (Patient not taking: Reported on 02/24/2018) 14 packet 0  . potassium chloride SA (K-DUR,KLOR-CON) 20 MEQ tablet Take 1 tablet (20 mEq total) by mouth daily. (Patient not taking: Reported on 02/24/2018) 30 tablet 0  . prochlorperazine (COMPAZINE) 10 MG tablet Take 1 tablet (10 mg total) by mouth every  6 (six) hours as needed for nausea or vomiting. (Patient not taking: Reported on 02/24/2018) 30 tablet 3  . promethazine (PHENERGAN) 25 MG tablet Take 1 tablet (25 mg total) by mouth every 6 (six) hours as needed for nausea or vomiting. (Patient not taking: Reported on 02/24/2018) 30 tablet 0  . traMADol (ULTRAM) 50 MG tablet Take 1 tablet (50 mg total) by mouth every 6 (six) hours as needed. 30 tablet 0   No current facility-administered medications for this visit.    Facility-Administered Medications Ordered in Other Visits  Medication Dose Route Frequency Provider Last Rate Last Dose  . 0.9 %  sodium chloride infusion   Intravenous Once Truitt Merle, MD      . sodium chloride 0.9 % injection 10 mL  10 mL Intravenous PRN Truitt Merle, MD   10 mL at 12/11/15 1532  . sodium chloride 0.9 % injection 10 mL  10 mL Intravenous PRN Truitt Merle, MD   10 mL at 06/11/16 1505  . sodium chloride 0.9 % injection 10 mL  10 mL Intravenous PRN Truitt Merle, MD   10 mL at 08/04/17 1643  . sodium chloride flush (NS) 0.9 % injection 10 mL  10 mL Intravenous PRN Truitt Merle, MD   10 mL at 10/09/15 1717  . sodium chloride flush (NS) 0.9 % injection 10 mL  10 mL Intracatheter PRN Truitt Merle, MD   10 mL at 02/24/18 1634    PHYSICAL EXAMINATION: ECOG PERFORMANCE STATUS: 1 - Symptomatic but completely ambulatory  Vitals:   02/24/18 1223  BP: 108/79  Pulse: 79  Resp: 18  Temp: 97.7 F (36.5 C)  SpO2: 100%   Filed Weights   02/24/18 1223  Weight: 134 lb 8 oz (61 kg)    GENERAL:alert, no distress and comfortable SKIN: skin color, texture, turgor are normal, no rashes or significant lesions EYES: normal, Conjunctiva are pink and non-injected, sclera clear OROPHARYNX:no exudate, no erythema and lips, buccal mucosa, and tongue normal  NECK: supple, thyroid normal size, non-tender, without nodularity LYMPH:  no palpable lymphadenopathy in the cervical, axillary or inguinal LUNGS: clear to auscultation and percussion with  normal breathing effort HEART: regular rate & rhythm and no murmurs and no lower extremity edema ABDOMEN:abdomen soft, non-tender and normal bowel sounds Musculoskeletal:no cyanosis of digits and no clubbing  NEURO: alert & oriented x 3 with fluent speech, no focal motor/sensory deficits  LABORATORY DATA:  I have reviewed the data as listed CBC Latest Ref Rng & Units 02/24/2018 02/10/2018 02/02/2018  WBC 4.0 - 10.5 K/uL 3.7(L) 4.4 7.5  Hemoglobin 12.0 - 15.0 g/dL 10.9(L) 10.1(L) 10.8(L)  Hematocrit 36.0 - 46.0 % 33.8(L) 32.1(L) 33.0(L)  Platelets 150 - 400 K/uL 228 220 192     CMP Latest Ref Rng & Units 02/24/2018 02/10/2018 02/02/2018  Glucose 70 - 99 mg/dL 95 136(H) 81  BUN 6 - 20 mg/dL '11 13 15  '$ Creatinine 0.44 - 1.00 mg/dL 0.72 0.70 0.76  Sodium 135 - 145 mmol/L 142 143 141  Potassium 3.5 - 5.1 mmol/L 3.6 3.1(L) 3.6  Chloride 98 - 111 mmol/L 107 106 107  CO2 22 - 32 mmol/L '26 28 22  '$ Calcium 8.9 - 10.3 mg/dL 9.1 9.8 9.3  Total Protein 6.5 - 8.1 g/dL 7.2 7.0 7.3  Total Bilirubin 0.3 - 1.2 mg/dL <0.2(L) 0.2(L) 0.7  Alkaline Phos 38 - 126 U/L 85 66 65  AST 15 - 41 U/L '23 17 22  '$ ALT 0 - 44 U/L <6 <6 <6      RADIOGRAPHIC STUDIES: I have personally reviewed the radiological images as listed and agreed with the findings in the report. No results found.   ASSESSMENT & PLAN:  Hazely Sealey is a 48 y.o. female with   1. 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 She was treated with CyborD and bone marrow transplant. She progressed on maintenance Velcade in 02/2016. She was treated with more Dara and CyborD, which was stopped after a severe Hep B flare. -She is currently being treatedwith Carfilzomib and Pomalyst.  She is tolerating well overall. -Pt  had not been receiving her Pomalyst medication for 2 months now because it was not being shipped to her. My nurse called her pharmacy, they have left several message on her daughter's phone but  she has not called back. She will restart when she receives medication  -Labs reviewed, hg at 10.9, WBC at 3.7, otherwise CBC and CMP are WNL. Overall adequate to proceed with Pomalyst and IV Carfilzomib today  -F/u in 2 weeks    2. Radiating Left Neck pain  -Ongoing for the past 5 weeks but worsened in the past 2 weeks  -I will get MRI of neck for further evaluation in the next 2 weeks  -I will refer her back to Dr. Mickeal [name] for more work up if MRI negative  -I will prescribe her Tramadol for pain today (02/24/18)  3. Acute bronchitis versuspneumonia -Her 01/13/18 chest Xray showed atelectasis or developing pneumonia in the right middle lobe. This was seen on prior Xray before, likely chronic.  -She is s/p a course of Augmentin. Her symptoms resolve.  -Will repeat Xray in 1 month to follow up.    3.Chronic hepatitis B infection -Shehas knownchronichepatitis B infection,developed significant transaminitis and hyperbilirubinemiainJanuary 2019 -Sherestarted her Heb Btreatmentin 02/2017 -She will continue to be closely followed by ID Dr. Baxter Flattery  4. Type 2 diabetes, steroids induced -Previously treated with Metformin. No longer on dexamethasone.  -Has much improved, Glucose currently normal.    5. GERD, history of GI bleeding and nausea -Colonoscopy on 11/26/16 per Dr. Ardis Hughs notable for a polyp in the transverse colon, revealed to be tubular adenoma and negative for high grade dysplasia or malignancy.  -Continue omeprazole daily. We previously discussed steroids can worsen her acid reflux     Plan -I prescribed her Tramadol today for her neck and left shoulder/arm pain  -Labs reviewed and adequate to proceed with IV Carfilzomib today and continue every 2 weeks  -MRI of neck in the next 1-2 weeks  -Lab, flush and carfilzomib infusion every 2 weeks X4 -F/u on 2, and 6 weeks  -my nurse called her pharmacy regarding her Pomalyst refill, she will restart as soon as  she receives the shipment     No problem-specific Assessment & Plan notes found for this encounter.   Orders Placed This Encounter  Procedures  . MR Cervical Spine Wo Contrast    Standing Status:   Future    Standing Expiration Date:   02/24/2019    Order Specific Question:   What is the patient's sedation requirement?    Answer:   No Sedation    Order Specific Question:   Does the patient have a pacemaker or implanted devices?    Answer:   No    Order Specific Question:   Preferred imaging location?    Answer:   [facility] (table limit-350 lbs)    Order Specific Question:   Radiology Contrast Protocol - do NOT remove file path    Answer:   \\[email or web address]   All questions were answered. The patient knows to call the clinic with any problems, questions or concerns. No barriers to learning was detected. I spent 25 minutes counseling the patient face to face. The total time spent in the appointment was 30 minutes and more than 50% was on counseling and review of test results     Truitt Merle, MD 02/24/2018   I, Joslyn Devon, am acting as scribe for Truitt Merle, MD.   I have reviewed the above documentation for accuracy  and completeness, and I agree with the above.

## 2018-02-24 ENCOUNTER — Inpatient Hospital Stay (HOSPITAL_BASED_OUTPATIENT_CLINIC_OR_DEPARTMENT_OTHER): Payer: Medicaid Other | Admitting: Hematology

## 2018-02-24 ENCOUNTER — Inpatient Hospital Stay: Payer: Medicaid Other

## 2018-02-24 ENCOUNTER — Encounter: Payer: Self-pay | Admitting: Hematology

## 2018-02-24 VITALS — BP 108/79 | HR 79 | Temp 97.7°F | Resp 18 | Ht 62.0 in | Wt 134.5 lb

## 2018-02-24 DIAGNOSIS — M542 Cervicalgia: Secondary | ICD-10-CM | POA: Diagnosis not present

## 2018-02-24 DIAGNOSIS — M25512 Pain in left shoulder: Secondary | ICD-10-CM

## 2018-02-24 DIAGNOSIS — C9010 Plasma cell leukemia not having achieved remission: Secondary | ICD-10-CM

## 2018-02-24 DIAGNOSIS — E099 Drug or chemical induced diabetes mellitus without complications: Secondary | ICD-10-CM

## 2018-02-24 DIAGNOSIS — Z95828 Presence of other vascular implants and grafts: Secondary | ICD-10-CM

## 2018-02-24 DIAGNOSIS — Z5111 Encounter for antineoplastic chemotherapy: Secondary | ICD-10-CM | POA: Diagnosis not present

## 2018-02-24 DIAGNOSIS — K219 Gastro-esophageal reflux disease without esophagitis: Secondary | ICD-10-CM

## 2018-02-24 DIAGNOSIS — B181 Chronic viral hepatitis B without delta-agent: Secondary | ICD-10-CM | POA: Diagnosis not present

## 2018-02-24 DIAGNOSIS — C9012 Plasma cell leukemia in relapse: Secondary | ICD-10-CM | POA: Diagnosis not present

## 2018-02-24 DIAGNOSIS — M79602 Pain in left arm: Secondary | ICD-10-CM

## 2018-02-24 LAB — CBC WITH DIFFERENTIAL (CANCER CENTER ONLY)
Abs Immature Granulocytes: 0.01 10*3/uL (ref 0.00–0.07)
Basophils Absolute: 0 10*3/uL (ref 0.0–0.1)
Basophils Relative: 0 %
Eosinophils Absolute: 0 10*3/uL (ref 0.0–0.5)
Eosinophils Relative: 1 %
HCT: 33.8 % — ABNORMAL LOW (ref 36.0–46.0)
Hemoglobin: 10.9 g/dL — ABNORMAL LOW (ref 12.0–15.0)
Immature Granulocytes: 0 %
Lymphocytes Relative: 32 %
Lymphs Abs: 1.2 10*3/uL (ref 0.7–4.0)
MCH: 31.1 pg (ref 26.0–34.0)
MCHC: 32.2 g/dL (ref 30.0–36.0)
MCV: 96.6 fL (ref 80.0–100.0)
Monocytes Absolute: 0.4 10*3/uL (ref 0.1–1.0)
Monocytes Relative: 10 %
Neutro Abs: 2.1 10*3/uL (ref 1.7–7.7)
Neutrophils Relative %: 57 %
PLATELETS: 228 10*3/uL (ref 150–400)
RBC: 3.5 MIL/uL — ABNORMAL LOW (ref 3.87–5.11)
RDW: 13.4 % (ref 11.5–15.5)
WBC Count: 3.7 10*3/uL — ABNORMAL LOW (ref 4.0–10.5)
nRBC: 0 % (ref 0.0–0.2)

## 2018-02-24 LAB — CMP (CANCER CENTER ONLY)
ALK PHOS: 85 U/L (ref 38–126)
AST: 23 U/L (ref 15–41)
Albumin: 3.5 g/dL (ref 3.5–5.0)
Anion gap: 9 (ref 5–15)
BUN: 11 mg/dL (ref 6–20)
CO2: 26 mmol/L (ref 22–32)
Calcium: 9.1 mg/dL (ref 8.9–10.3)
Chloride: 107 mmol/L (ref 98–111)
Creatinine: 0.72 mg/dL (ref 0.44–1.00)
GFR, Est AFR Am: >60 mL/min (ref >60)
GFR, Estimated: >60 mL/min (ref >60)
Glucose, Bld: 95 mg/dL (ref 70–99)
Potassium: 3.6 mmol/L (ref 3.5–5.1)
Sodium: 142 mmol/L (ref 135–145)
Total Bilirubin: <0.2 mg/dL — ABNORMAL LOW (ref 0.3–1.2)
Total Protein: 7.2 g/dL (ref 6.5–8.1)

## 2018-02-24 MED ORDER — SODIUM CHLORIDE 0.9% FLUSH
10.0000 mL | INTRAVENOUS | Status: DC | PRN
  Administered 2018-02-24: 10 mL
  Filled 2018-02-24: qty 10

## 2018-02-24 MED ORDER — PALONOSETRON HCL INJECTION 0.25 MG/5ML
0.2500 mg | Freq: Once | INTRAVENOUS | Status: AC
  Administered 2018-02-24: 0.25 mg via INTRAVENOUS

## 2018-02-24 MED ORDER — PALONOSETRON HCL INJECTION 0.25 MG/5ML
INTRAVENOUS | Status: AC
  Filled 2018-02-24: qty 5

## 2018-02-24 MED ORDER — DEXTROSE 5 % IV SOLN
56.0000 mg/m2 | Freq: Once | INTRAVENOUS | Status: AC
  Administered 2018-02-24: 90 mg via INTRAVENOUS
  Filled 2018-02-24: qty 30

## 2018-02-24 MED ORDER — DEXAMETHASONE SODIUM PHOSPHATE 10 MG/ML IJ SOLN
8.0000 mg | Freq: Once | INTRAMUSCULAR | Status: AC
  Administered 2018-02-24: 8 mg via INTRAVENOUS

## 2018-02-24 MED ORDER — SODIUM CHLORIDE 0.9 % IV SOLN
Freq: Once | INTRAVENOUS | Status: AC
  Administered 2018-02-24: 14:00:00 via INTRAVENOUS
  Filled 2018-02-24: qty 250

## 2018-02-24 MED ORDER — HEPARIN SOD (PORK) LOCK FLUSH 100 UNIT/ML IV SOLN
500.0000 [IU] | Freq: Once | INTRAVENOUS | Status: AC | PRN
  Administered 2018-02-24: 500 [IU]
  Filled 2018-02-24: qty 5

## 2018-02-24 MED ORDER — DEXAMETHASONE SODIUM PHOSPHATE 10 MG/ML IJ SOLN
INTRAMUSCULAR | Status: AC
  Filled 2018-02-24: qty 1

## 2018-02-24 MED ORDER — SODIUM CHLORIDE 0.9% FLUSH
10.0000 mL | INTRAVENOUS | Status: DC | PRN
  Administered 2018-02-24: 10 mL via INTRAVENOUS
  Filled 2018-02-24: qty 10

## 2018-02-24 MED ORDER — SODIUM CHLORIDE 0.9 % IV SOLN
Freq: Once | INTRAVENOUS | Status: DC
  Filled 2018-02-24: qty 250

## 2018-02-24 MED ORDER — TRAMADOL HCL 50 MG PO TABS: 50.0000 mg | ORAL_TABLET | Freq: Four times a day (QID) | ORAL | 0 refills | Status: DC | PRN

## 2018-02-24 MED ORDER — PROCHLORPERAZINE MALEATE 10 MG PO TABS
10.0000 mg | ORAL_TABLET | Freq: Once | ORAL | Status: AC
  Administered 2018-02-24: 10 mg via ORAL

## 2018-02-24 MED ORDER — PROCHLORPERAZINE MALEATE 10 MG PO TABS
ORAL_TABLET | ORAL | Status: AC
  Filled 2018-02-24: qty 1

## 2018-02-24 MED FILL — traMADol HCL 50 MG TABS: 50 | 7 days supply | Qty: 30 | Fill #0

## 2018-02-24 NOTE — Addendum Note (Signed)
Addended by: Truitt Merle on: 02/24/2018 07:37 PM   Modules accepted: Orders

## 2018-02-24 NOTE — Patient Instructions (Signed)
[facility] Discharge Instructions for Patients Receiving Chemotherapy  Today you received the following chemotherapy agents: Carfilzomib (Kyprolis)  To help prevent nausea and vomiting after your treatment, we encourage you to take your nausea medication as directed. Received Aloxi during treatment today-->Take Compazine (not Zofran) for the next 3 days as needed.    If you develop nausea and vomiting that is not controlled by your nausea medication, call the clinic.   BELOW ARE SYMPTOMS THAT SHOULD BE REPORTED IMMEDIATELY:  *FEVER GREATER THAN 100.5 F  *CHILLS WITH OR WITHOUT FEVER  NAUSEA AND VOMITING THAT IS NOT CONTROLLED WITH YOUR NAUSEA MEDICATION  *UNUSUAL SHORTNESS OF BREATH  *UNUSUAL BRUISING OR BLEEDING  TENDERNESS IN MOUTH AND THROAT WITH OR WITHOUT PRESENCE OF ULCERS  *URINARY PROBLEMS  *BOWEL PROBLEMS  UNUSUAL RASH Items with * indicate a potential emergency and should be followed up as soon as possible.  Feel free to call the clinic should you have any questions or concerns. The clinic phone number is [phone].  Please show the [place] at check-in to the Emergency Department and triage nurse.

## 2018-02-24 NOTE — Progress Notes (Signed)
Spoke with [facility] regarding patient's Pomalyst per representative was told they left 3 messages in November and 3 message in December for them to call back to arrange shipment.  The contact number they have is the patient's daughter who speaks [place].  Also her enrollment has expired and she must reapply.  Explained to representative that she only speaks [place] and they must use a [place] interpreter.  That was noted on the chart.  Visited patient in [place], through [place] interpreter number for [employer] to reapply for coverage for Pomalyst was provided. Explained she must do this and then we will send in a new script and [employer] authorization will be sent in.  Also they must respond to arrange shipments of this medication.  Dr. Burr Medico was made aware.

## 2018-02-26 ENCOUNTER — Telehealth: Payer: Self-pay | Admitting: Hematology

## 2018-02-26 NOTE — Telephone Encounter (Signed)
Scheduled appt per 01/22 los.  Called patient to let patient know about scheduled appt.  The patient sister stated she will let the patient know, and they will get a print out when she comes to her next doc visit.

## 2018-03-03 ENCOUNTER — Ambulatory Visit (HOSPITAL_COMMUNITY): Payer: Medicaid Other

## 2018-03-04 ENCOUNTER — Telehealth: Payer: Self-pay

## 2018-03-04 NOTE — Telephone Encounter (Signed)
Oral Oncology Patient Advocate Encounter  I received notification that Pomalyst has been approved to be filled with [employer] manufacturer assistance through 02/03/19. Pomalyst will be filled with [facility] by [employer] and can be escribed to the [facility], [place] location.  I called the patients daughter, [name] and gave her this information, she verbalized understanding and great appreciation.  [place] [facility] [place] Phone [phone] Fax [phone]

## 2018-03-08 NOTE — Progress Notes (Signed)
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    10/10/2014 Miscellaneous    Peripheral blood chemistry and leukocytosis with total white count 78K, comprised of large plasma cells and his normocytic anemia. There is a myeloid left shift with previous surgical radium blasts. Flow cytometry showed 64% plasma cells    10/10/2014 Bone Marrow Biopsy    Markedly hypercellular marrow (95%), Atypical plasma cells comprise 57% of the cellularity. There was diminished multilineage in hematopoiesis with adequate maturation. Breasts less than 1%), no overt dysplasia of the myeloid or erythroid lineages.     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    05/07/2015 - 05/12/2015 Hospital Admission    [facility], [facility]. ID  workup was negative, EGD showed evidence of gastritis and duodenitis, no H. pylori or CMV.    05/20/2015 - 05/24/2015 Hospital Admission    [facility]    07/11/2015 Bone Marrow Biopsy    Post transplant 100 a bone marrow biopsy showed hypocellular marrow, 20%, no increase in plasma cells (2%) or other abnormalities.    08/22/2015 - 01/02/2016 Chemotherapy    MaintenanceCyborD (cytoxan 3096mm2 iv, bortezomib 1.5 mg/m, dexamethasone 40 mg, every 2 weeks, changed to Velcade maintenance after 4 months treatment    01/16/2016 - 04/19/2016 Chemotherapy    Maintenance Velcade 1.3 mg/m every 2 weeks    02/22/2016 Pathology Results    BONE MARROW: Diagnosis Bone Marrow, Aspirate,Biopsy, and Clot, right iliac - NORMOCELLULAR BONE MARROW FOR AGE WITH TRILINEAGE HEMATOPOIESIS. - PLASMACYTOSIS (PLASMA CELLS 12%). - SEE COMMENT. PERIPHERAL BLOOD: - OCCASIONAL CIRCULATING PLASMA CELLS.    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     04/18/2016 - 03/26/2017 Chemotherapy    Daratumumab per protocol  CyBorD every week, cytoxan was held after 04/24/2016 dye to cytopenia and infection  -discontinued due to Hep B flare    05/11/2016 - 05/16/2016 Hospital Admission    Healthcare-associated pneumonia    02/16/2017 - 02/21/2017 Hospital Admission    Admission diagnosis: Abnormal LFT's Additional comments: Assoc diagnoses: Hep B flare, transminitis, dehydration, fever, SIRS    04/06/2017 -  Chemotherapy    Carfilzomib 56 mg/m2 on days 1 and 15 (except 2018m2 on C1D1 and C1D2) with dexamethasone 20 mg on same days and pomalidomide 2 mg on days 1-21  of 28 day cycle        08/26/2017 - 08/28/2017 Hospital Admission    Admit date: 08/26/2017 Admission diagnosis: RLL Pneumonia       CURRENT THERAPY:  Carfilzomib 56 mg/m2 on days 1 and 15(except 16m/m2 on C1D1 and C1D2)with dexamethasone 20  mg on same days and pomalidomide 2 mg on days 1-21 of 28 day cycleon 04/13/17, dexa stopped on 07/06/2017 due to her CR Pt has bene off Pomalyst since late Nov due to communication issue with pharmacy  INTERVAL HISTORY:  Sheryl [name] is here for a follow up of treatment. She presents to the clinic today with her translator. She notes her pain from left neck and left arm remains and slightly worse (9/10). Her pain is exacerbated with certain movements of her neck. She has had trouble sleeping from this. Her MRI spine was benign but showed bulging disc. She denies any trauma or injury to her neck before. She denies any other issues or pain elsewhere.  She has been tolerating chemo treatment moderately well with intermittent nausea. She is fine to proceed with treatment today. She notes she still has not gotten her Pomalyst. She notes her daughter has contacted pharmacist and never got response back.    REVIEW OF SYSTEMS:   Constitutional: Denies fevers, chills or abnormal weight loss (+) trouble sleeping  Eyes: Denies blurriness of vision Ears, nose, mouth, throat, and face: Denies mucositis or sore throat Respiratory: Denies cough, dyspnea or wheezes Cardiovascular: Denies palpitation, chest discomfort or lower extremity swelling Gastrointestinal:  Denies heartburn or change in bowel habits (+) Intermittent nausea  Skin: Denies abnormal skin rashes MSK: (+) Pain from left neck down arm  Lymphatics: Denies new lymphadenopathy or easy bruising Neurological:Denies numbness, tingling or new weaknesses Behavioral/Psych: Mood is stable, no new changes  All other systems were reviewed with the patient and are negative.  MEDICAL HISTORY:  Past Medical History:  Diagnosis Date  . Chills with fever    intermittently since d/c from hospital  . Dysuria-frequency syndrome    w/ pink urine  . GERD (gastroesophageal reflux disease)   . Hepatitis   . History of positive PPD    DX 2011--  CXR DONE NO  EVIDENCE  . History of ureter stent   . Hydronephrosis, right   . Neuromuscular disorder ([facility])    legs numb intermittently  . 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   . Pneumonia   . Right ureteral stone   . Urosepsis 8/14   ad[facility]    SURGICAL HISTORY: Past Surgical History:  Procedure Laterality Date  . CYSTOSCOPY W/ URETERAL STENT PLACEMENT Right 09/25/2012   Procedure: CYSTOSCOPY WITH RETROGRADE PYELOGRAM/URETERAL STENT PLACEMENT;  Surgeon: TAlexis Frock MD;  Location: [facility];  Service: Urology;  Laterality: Right;  . CYSTOSCOPY WITH RETROGRADE PYELOGRAM, URETEROSCOPY AND STENT PLACEMENT Right 10/15/2012   Procedure: CYSTOSCOPY WITH RETROGRADE PYELOGRAM, URETEROSCOPY AND REMOVAL STENT WITH  STENT PLACEMENT;  Surgeon: TAlexis Frock MD;  [facility]  Service: Urology;  Laterality: Right;  . ESOPHAGOGASTRODUODENOSCOPY (EGD) WITH PROPOFOL N/A 11/16/2014   Procedure: ESOPHAGOGASTRODUODENOSCOPY (EGD) WITH PROPOFOL;  Surgeon: DMilus Banister MD;  Location: [facility];  Service: Endoscopy;  Laterality: N/A;  . HOLMIUM LASER APPLICATION Right 99/32/3557  Procedure: HOLMIUM LASER APPLICATION;  Surgeon: TAlexis Frock MD;  [facility]  Service: Urology;  Laterality: Right;  . LIVER BIOPSY    . OTHER SURGICAL HISTORY Right    removal of ovarian  cyst  . removal of uterine cyst     years ago  . RIGHT VATS W/ DRAINAGE PEURAL EFFUSION AND BX'S  10-30-2008    I have reviewed the social history and family history with the patient and they are unchanged from previous note.  ALLERGIES:  has No Known Allergies.  MEDICATIONS:  Current Outpatient Medications  Medication Sig Dispense Refill  . feeding supplement, ENSURE ENLIVE, (ENSURE ENLIVE) LIQD Take 237 mLs by mouth 2 (two) times daily between meals. 237 mL 12  . ibuprofen (ADVIL,MOTRIN) 200 MG tablet Take 200 mg by mouth every 6 (six) hours as needed for moderate pain.    . magic  mouthwash SOLN Take 5 mLs by mouth 4 (four) times daily as needed for mouth pain. 240 mL 3  . omeprazole (PRILOSEC) 20 MG capsule Take 1 capsule (20 mg total) by mouth daily. 30 capsule 5  . ondansetron (ZOFRAN ODT) 8 MG disintegrating tablet Take 1 tablet (8 mg total) by mouth every 8 (eight) hours as needed for nausea or vomiting. 40 tablet 1  . potassium chloride (KLOR-CON) 20 MEQ packet Take 20 mEq by mouth 2 (two) times daily. 1 pack twice daily for 3 days then 1 pack daily 14 packet 0  . prochlorperazine (COMPAZINE) 10 MG tablet Take 1 tablet (10 mg total) by mouth every 6 (six) hours as needed for nausea or vomiting. 30 tablet 3  . promethazine (PHENERGAN) 25 MG tablet Take 1 tablet (25 mg total) by mouth every 6 (six) hours as needed for nausea or vomiting. 30 tablet 0  . methocarbamol (ROBAXIN) 500 MG tablet Take 1 tablet (500 mg total) by mouth every 6 (six) hours as needed for muscle spasms. (Patient not taking: Reported on 03/10/2018) 40 tablet 0  . polyethylene glycol (MIRALAX / GLYCOLAX) packet Take 17 g by mouth daily as needed (constipation). (Patient not taking: Reported on 02/24/2018) 14 each 1  . pomalidomide (POMALYST) 2 MG capsule Take 1 capsule (2 mg total) by mouth daily. Take with water on days 1-21. Repeat every 28 days. 21 capsule 0  . traMADol (ULTRAM) 50 MG tablet Take 1 tablet (50 mg total) by mouth every 6 (six) hours as needed. (Patient not taking: Reported on 03/10/2018) 30 tablet 0  . VIREAD 300 MG tablet Take 1 tablet (300 mg total) by mouth daily. (Patient not taking: Reported on 03/10/2018) 30 tablet 11   No current facility-administered medications for this visit.    Facility-Administered Medications Ordered in Other Visits  Medication Dose Route Frequency Provider Last Rate Last Dose  . sodium chloride 0.9 % injection 10 mL  10 mL Intravenous PRN Truitt Merle, MD   10 mL at 12/11/15 1532  . sodium chloride 0.9 % injection 10 mL  10 mL Intravenous PRN Truitt Merle, MD   10 mL  at 06/11/16 1505  . sodium chloride 0.9 % injection 10 mL  10 mL Intravenous PRN Truitt Merle, MD   10 mL at 08/04/17 1643  . sodium chloride flush (NS) 0.9 % injection 10 mL  10 mL Intravenous PRN Truitt Merle, MD   10 mL at 10/09/15 1717    PHYSICAL EXAMINATION: ECOG PERFORMANCE STATUS: 2 - Symptomatic, <50% confined to bed  Vitals:   03/10/18 1300 03/10/18 1305  BP: (!) 131/93 (!) 127/96  Pulse: 78   Resp: 18   Temp: 98.1 F (36.7 C)   SpO2: 100%    Filed Weights   03/10/18 1300  Weight: 132 lb 9.6  oz (60.1 kg)    GENERAL:alert, no distress and comfortable SKIN: skin color, texture, turgor are normal, no rashes or significant lesions EYES: normal, Conjunctiva are pink and non-injected, sclera clear OROPHARYNX:no exudate, no erythema and lips, buccal mucosa, and tongue normal  NECK: supple, thyroid normal size, non-tender, without nodularity LYMPH:  no palpable lymphadenopathy in the cervical, axillary or inguinal LUNGS: clear to auscultation and percussion with normal breathing effort HEART: regular rate & rhythm and no murmurs and no lower extremity edema ABDOMEN:abdomen soft, non-tender and normal bowel sounds Musculoskeletal:no cyanosis of digits and no clubbing  NEURO: alert & oriented x 3 with fluent speech, no focal motor/sensory deficits  LABORATORY DATA:  I have reviewed the data as listed CBC Latest Ref Rng & Units 03/10/2018 02/24/2018 02/10/2018  WBC 4.0 - 10.5 K/uL 3.6(L) 3.7(L) 4.4  Hemoglobin 12.0 - 15.0 g/dL 10.6(L) 10.9(L) 10.1(L)  Hematocrit 36.0 - 46.0 % 33.7(L) 33.8(L) 32.1(L)  Platelets 150 - 400 K/uL 201 228 220     CMP Latest Ref Rng & Units 02/24/2018 02/10/2018 02/02/2018  Glucose 70 - 99 mg/dL 95 136(H) 81  BUN 6 - 20 mg/dL _0 Creatinine 0.44 - 1.00 mg/dL 0.72 0.70 0.76  Sodium 135 - 145 mmol/L 142 143 141  Potassium 3.5 - 5.1 mmol/L 3.6 3.1(L) 3.6  Chloride 98 - 111 mmol/L 107 106 107  CO2 22 - 32 mmol/L _1 Calcium 8.9 - 10.3 mg/dL 9.1  9.8 9.3  Total Protein 6.5 - 8.1 g/dL 7.2 7.0 7.3  Total Bilirubin 0.3 - 1.2 mg/dL <0.2(L) 0.2(L) 0.7  Alkaline Phos 38 - 126 U/L 85 66 65  AST 15 - 41 U/L _2 ALT 0 - 44 U/L <6 <6 <6      RADIOGRAPHIC STUDIES: I have personally reviewed the radiological images as listed and agreed with the findings in the report. Mr Cervical Spine Wo Contrast  Result Date: 03/09/2018 CLINICAL DATA:  Chronic left shoulder and arm pain. No known injury. EXAM: MRI CERVICAL SPINE WITHOUT CONTRAST TECHNIQUE: Multiplanar, multisequence MR imaging of the cervical spine was performed. No intravenous contrast was administered. COMPARISON:  None. FINDINGS: Alignment: Maintained with straightening of lordosis noted. Convex left scoliosis is seen. Vertebrae: No fracture or worrisome lesion. Cord: Normal signal throughout. Posterior Fossa, vertebral arteries, paraspinal tissues: Negative. Disc levels: C2-3: Negative. C3-4: Mild uncovertebral disease on the right and a minimal bulge. The central canal and left foramen are open. Mild right foraminal narrowing noted. C4-5: Shallow central protrusion with slight caudal extension effaces the ventral thecal sac. The foramina are open. C5-6: Negative. C6-7: Minimal disc bulge and mild facet degenerative change. No stenosis. C7-T1: Negative. IMPRESSION: Mild right foraminal at C3-4 narrowing due to uncovertebral disease. Shallow central protrusion with slight caudal extension at C4-5 effaces the ventral thecal sac. Convex left scoliosis and straightening of the normal cervical lordosis. Electronically Signed   By: Inge Rise M.D.   On: 03/09/2018 15:18     ASSESSMENT & PLAN:  Sheryl [name] is a 48 y.o. female with   1. 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 She was treated with CyborD and bone marrow transplant. She progressed on maintenance Velcade in 02/2016. She was treated with more Dara and CyborD, which was stopped  after a severe Hep B flare. -She is currently being treatedwith Carfilzomib and Pomalyst.She is tolerating well overall. -Pt has not been on Pomalyst medication  for the past 2 months because it was not being shipped to her. She still has not received her medication from Pharmacy due to poor communication. I suggest sending prescription to our clinic for her to pick up, she agrees. I will follow up with pharmacy.  -Labs reviewed and adequate to proceed with IV Carfilzomib today  -Her M protein has been negative, she is still in remission -F/u in 1 month    2. Radiating Left Neck pain  -Ongoing since late 12/2017 but continues to worsened in the past few weeks.  -She denies history of neck trauma or injury -Her MRI Spine from 03/09/18 showed mild cervical disk bulging, but overall benign. This is likely nerve related.  -I will discuss scan with Dr. Mickeal Skinner and he will see her next Monday -She states tramadol does not help her pain, she is in severe pain, I gave her 1 dose of Percocet in the hospital with good relief.  I recommend her to start Neurontin 300 mg at night, and increase to 3 times daily if she tolerates.  I also called in oxycodone 5 mg 15 tablets q12h as needed for severe pain.   3.Chronichepatitis B infection -Shehas knownchronichepatitis B infection,developed significant transaminitis and hyperbilirubinemiainJanuary 2019 -Sherestarted her Heb Btreatmentin 02/2017 -She will continue to be closely followed by ID Dr. Baxter Flattery   4. Type 2 diabetes, steroids induced -Previously treated with Metformin. No longer on dexamethasone.  -Has much improved, Glucose normal lately.    5. GERD, history of GI bleeding and nausea -Colonoscopy on 11/26/16 per Dr. Ardis Hughs notable for a polyp in the transverse colon, revealed to be tubular adenoma and negative for high grade dysplasia or malignancy.  -Continue omeprazole daily. We previously discussed steroids can worsen her acid  reflux     Plan -Labs reviewed and adequate to proceed with IV Carfilzomib today and continue every 2 weeks  -our oral pharmacist Jess will call her pharmacy to change her Pomalyst shipment to our cancer center, and I will call patient's daughter to pick up she will start as soon as she receives. -She will start Neurontin for her left arm pain, I also give her a short course of oxycodone for severe pain -She was seen neurologist Dr. Mickeal Skinner next Monday -Lab, flush, f/u and treatment on 3/4     No problem-specific Assessment & Plan notes found for this encounter.   No orders of the defined types were placed in this encounter.  All questions were answered. The patient knows to call the clinic with any problems, questions or concerns. No barriers to learning was detected. I spent 25 minutes counseling the patient face to face. The total time spent in the appointment was 30 minutes and more than 50% was on counseling and review of test results     Truitt Merle, MD 03/10/2018   I, Joslyn Devon, am acting as scribe for Truitt Merle, MD.   I have reviewed the above documentation for accuracy and completeness, and I agree with the above.

## 2018-03-09 ENCOUNTER — Ambulatory Visit (HOSPITAL_COMMUNITY)
Admission: RE | Admit: 2018-03-09 | Discharge: 2018-03-09 | Disposition: A | Discharge status: Home / Self Care | Payer: Medicaid Other | Source: Ambulatory Visit | Attending: Hematology | Admitting: Hematology

## 2018-03-09 DIAGNOSIS — C9012 Plasma cell leukemia in relapse: Secondary | ICD-10-CM | POA: Diagnosis not present

## 2018-03-10 ENCOUNTER — Inpatient Hospital Stay: Payer: Medicaid Other

## 2018-03-10 ENCOUNTER — Telehealth: Payer: Self-pay | Admitting: Hematology

## 2018-03-10 ENCOUNTER — Inpatient Hospital Stay: Payer: Medicaid Other | Attending: Hematology

## 2018-03-10 ENCOUNTER — Inpatient Hospital Stay (HOSPITAL_BASED_OUTPATIENT_CLINIC_OR_DEPARTMENT_OTHER): Payer: Medicaid Other | Admitting: Hematology

## 2018-03-10 VITALS — BP 127/96 | HR 78 | Temp 98.1°F | Resp 18 | Ht 62.0 in | Wt 132.6 lb

## 2018-03-10 VITALS — BP 137/89 | HR 77

## 2018-03-10 DIAGNOSIS — K219 Gastro-esophageal reflux disease without esophagitis: Secondary | ICD-10-CM

## 2018-03-10 DIAGNOSIS — Z5112 Encounter for antineoplastic immunotherapy: Secondary | ICD-10-CM | POA: Insufficient documentation

## 2018-03-10 DIAGNOSIS — Z9484 Stem cells transplant status: Secondary | ICD-10-CM

## 2018-03-10 DIAGNOSIS — Z79899 Other long term (current) drug therapy: Secondary | ICD-10-CM | POA: Insufficient documentation

## 2018-03-10 DIAGNOSIS — M5412 Radiculopathy, cervical region: Secondary | ICD-10-CM | POA: Diagnosis not present

## 2018-03-10 DIAGNOSIS — B181 Chronic viral hepatitis B without delta-agent: Secondary | ICD-10-CM

## 2018-03-10 DIAGNOSIS — C9010 Plasma cell leukemia not having achieved remission: Secondary | ICD-10-CM

## 2018-03-10 DIAGNOSIS — C9012 Plasma cell leukemia in relapse: Secondary | ICD-10-CM | POA: Diagnosis present

## 2018-03-10 DIAGNOSIS — M79602 Pain in left arm: Secondary | ICD-10-CM | POA: Diagnosis not present

## 2018-03-10 DIAGNOSIS — D649 Anemia, unspecified: Secondary | ICD-10-CM | POA: Insufficient documentation

## 2018-03-10 DIAGNOSIS — E119 Type 2 diabetes mellitus without complications: Secondary | ICD-10-CM | POA: Diagnosis not present

## 2018-03-10 DIAGNOSIS — Z9221 Personal history of antineoplastic chemotherapy: Secondary | ICD-10-CM

## 2018-03-10 DIAGNOSIS — Z95828 Presence of other vascular implants and grafts: Secondary | ICD-10-CM

## 2018-03-10 LAB — CBC WITH DIFFERENTIAL (CANCER CENTER ONLY)
Abs Immature Granulocytes: 0.01 10*3/uL (ref 0.00–0.07)
BASOS ABS: 0 10*3/uL (ref 0.0–0.1)
BASOS PCT: 0 %
Eosinophils Absolute: 0 10*3/uL (ref 0.0–0.5)
Eosinophils Relative: 1 %
HCT: 33.7 % — ABNORMAL LOW (ref 36.0–46.0)
Hemoglobin: 10.6 g/dL — ABNORMAL LOW (ref 12.0–15.0)
Immature Granulocytes: 0 %
Lymphocytes Relative: 28 %
Lymphs Abs: 1 10*3/uL (ref 0.7–4.0)
MCH: 30.3 pg (ref 26.0–34.0)
MCHC: 31.5 g/dL (ref 30.0–36.0)
MCV: 96.3 fL (ref 80.0–100.0)
MONOS PCT: 8 %
Monocytes Absolute: 0.3 10*3/uL (ref 0.1–1.0)
NEUTROS PCT: 63 %
Neutro Abs: 2.2 10*3/uL (ref 1.7–7.7)
Platelet Count: 201 10*3/uL (ref 150–400)
RBC: 3.5 MIL/uL — ABNORMAL LOW (ref 3.87–5.11)
RDW: 13.3 % (ref 11.5–15.5)
WBC Count: 3.6 10*3/uL — ABNORMAL LOW (ref 4.0–10.5)
nRBC: 0 % (ref 0.0–0.2)

## 2018-03-10 LAB — CMP (CANCER CENTER ONLY)
ALBUMIN: 3.5 g/dL (ref 3.5–5.0)
ALT: <6 U/L (ref 0–44)
AST: 22 U/L (ref 15–41)
Alkaline Phosphatase: 79 U/L (ref 38–126)
Anion gap: 5 (ref 5–15)
BUN: 16 mg/dL (ref 6–20)
CO2: 27 mmol/L (ref 22–32)
Calcium: 9.2 mg/dL (ref 8.9–10.3)
Chloride: 109 mmol/L (ref 98–111)
Creatinine: 0.78 mg/dL (ref 0.44–1.00)
GFR, Est AFR Am: >60 mL/min (ref >60)
GFR, Estimated: >60 mL/min (ref >60)
GLUCOSE: 109 mg/dL — AB (ref 70–99)
Potassium: 3.6 mmol/L (ref 3.5–5.1)
Sodium: 141 mmol/L (ref 135–145)
Total Bilirubin: 0.2 mg/dL — ABNORMAL LOW (ref 0.3–1.2)
Total Protein: 6.9 g/dL (ref 6.5–8.1)

## 2018-03-10 LAB — PREGNANCY, URINE: Preg Test, Ur: NEGATIVE

## 2018-03-10 MED ORDER — PALONOSETRON HCL INJECTION 0.25 MG/5ML
0.2500 mg | Freq: Once | INTRAVENOUS | Status: AC
  Administered 2018-03-10: 0.25 mg via INTRAVENOUS

## 2018-03-10 MED ORDER — HEPARIN SOD (PORK) LOCK FLUSH 100 UNIT/ML IV SOLN
500.0000 [IU] | Freq: Once | INTRAVENOUS | Status: AC | PRN
  Administered 2018-03-10: 500 [IU]
  Filled 2018-03-10: qty 5

## 2018-03-10 MED ORDER — PROCHLORPERAZINE MALEATE 10 MG PO TABS
10.0000 mg | ORAL_TABLET | Freq: Once | ORAL | Status: AC
  Administered 2018-03-10: 10 mg via ORAL

## 2018-03-10 MED ORDER — DEXAMETHASONE SODIUM PHOSPHATE 10 MG/ML IJ SOLN
8.0000 mg | Freq: Once | INTRAMUSCULAR | Status: AC
  Administered 2018-03-10: 8 mg via INTRAVENOUS

## 2018-03-10 MED ORDER — SODIUM CHLORIDE 0.9% FLUSH
10.0000 mL | INTRAVENOUS | Status: DC | PRN
  Administered 2018-03-10: 10 mL
  Filled 2018-03-10: qty 10

## 2018-03-10 MED ORDER — OXYCODONE-ACETAMINOPHEN 5-325 MG PO TABS
1.0000 | ORAL_TABLET | ORAL | Status: AC
  Administered 2018-03-10: 1 via ORAL

## 2018-03-10 MED ORDER — PROCHLORPERAZINE MALEATE 10 MG PO TABS
ORAL_TABLET | ORAL | Status: AC
  Filled 2018-03-10: qty 1

## 2018-03-10 MED ORDER — OXYCODONE-ACETAMINOPHEN 5-325 MG PO TABS
ORAL_TABLET | ORAL | Status: AC
  Filled 2018-03-10: qty 1

## 2018-03-10 MED ORDER — SODIUM CHLORIDE 0.9% FLUSH
10.0000 mL | INTRAVENOUS | Status: DC | PRN
  Administered 2018-03-10: 10 mL via INTRAVENOUS
  Filled 2018-03-10: qty 10

## 2018-03-10 MED ORDER — PALONOSETRON HCL INJECTION 0.25 MG/5ML
INTRAVENOUS | Status: AC
  Filled 2018-03-10: qty 5

## 2018-03-10 MED ORDER — DEXTROSE 5 % IV SOLN
56.0000 mg/m2 | Freq: Once | INTRAVENOUS | Status: AC
  Administered 2018-03-10: 90 mg via INTRAVENOUS
  Filled 2018-03-10: qty 30

## 2018-03-10 MED ORDER — DEXAMETHASONE SODIUM PHOSPHATE 10 MG/ML IJ SOLN
INTRAMUSCULAR | Status: AC
  Filled 2018-03-10: qty 1

## 2018-03-10 MED ORDER — SODIUM CHLORIDE 0.9 % IV SOLN
Freq: Once | INTRAVENOUS | Status: AC
  Administered 2018-03-10: 14:00:00 via INTRAVENOUS
  Filled 2018-03-10: qty 250

## 2018-03-10 MED ORDER — GABAPENTIN 300 MG PO CAPS: 300.0000 mg | ORAL_CAPSULE | Freq: Three times a day (TID) | ORAL | 0 refills | Status: DC

## 2018-03-10 MED ORDER — OXYCODONE HCL 5 MG PO TABS: 5.0000 mg | ORAL_TABLET | Freq: Two times a day (BID) | ORAL | 0 refills | Status: DC | PRN

## 2018-03-10 MED FILL — TENOFOVIR DISOPROXIL FUMARA: 300 | 30 days supply | Qty: 30 | Fill #1

## 2018-03-10 MED FILL — OMEPRAZOLE 20 MG CPDR: 20 | 30 days supply | Qty: 30 | Fill #2

## 2018-03-10 MED FILL — oxyCODONE HCL 5 MG TABS: 5 | 7 days supply | Qty: 15 | Fill #0

## 2018-03-10 MED FILL — GABAPENTIN 300 MG CAPSULE: 300 | 20 days supply | Qty: 60 | Fill #0

## 2018-03-10 NOTE — Patient Instructions (Signed)
[facility] Discharge Instructions for Patients Receiving Chemotherapy  Today you received the following chemotherapy agents: Carfilzomib (Kyprolis)  To help prevent nausea and vomiting after your treatment, we encourage you to take your nausea medication as directed. Received Aloxi during treatment today-->Take Compazine (not Zofran) for the next 3 days as needed.    If you develop nausea and vomiting that is not controlled by your nausea medication, call the clinic.   BELOW ARE SYMPTOMS THAT SHOULD BE REPORTED IMMEDIATELY:  *FEVER GREATER THAN 100.5 F  *CHILLS WITH OR WITHOUT FEVER  NAUSEA AND VOMITING THAT IS NOT CONTROLLED WITH YOUR NAUSEA MEDICATION  *UNUSUAL SHORTNESS OF BREATH  *UNUSUAL BRUISING OR BLEEDING  TENDERNESS IN MOUTH AND THROAT WITH OR WITHOUT PRESENCE OF ULCERS  *URINARY PROBLEMS  *BOWEL PROBLEMS  UNUSUAL RASH Items with * indicate a potential emergency and should be followed up as soon as possible.  Feel free to call the clinic should you have any questions or concerns. The clinic phone number is [phone].  Please show the [place] at check-in to the Emergency Department and triage nurse.

## 2018-03-10 NOTE — Progress Notes (Signed)
Patient was provided the phone number for [employer] and her patient ID number, explained to her with [place] interpreter present that she must call them today and do her patient survey so that they will provide her with Pomalyst.  She verbalized an understanding.

## 2018-03-10 NOTE — Telephone Encounter (Signed)
No los per 02/05.

## 2018-03-11 ENCOUNTER — Other Ambulatory Visit: Payer: Self-pay

## 2018-03-11 ENCOUNTER — Telehealth: Payer: Self-pay | Admitting: Pharmacist

## 2018-03-11 ENCOUNTER — Encounter: Payer: Self-pay | Admitting: Hematology

## 2018-03-11 DIAGNOSIS — C9012 Plasma cell leukemia in relapse: Secondary | ICD-10-CM

## 2018-03-11 LAB — KAPPA/LAMBDA LIGHT CHAINS
Kappa free light chain: 19.3 mg/L (ref 3.3–19.4)
Kappa, lambda light chain ratio: 0.89 (ref 0.26–1.65)
Lambda free light chains: 21.8 mg/L (ref 5.7–26.3)

## 2018-03-11 MED ORDER — POMALIDOMIDE 2 MG PO CAPS: 2.0000 mg | ORAL_CAPSULE | Freq: Every day | ORAL | 0 refills | Status: DC

## 2018-03-11 NOTE — Telephone Encounter (Signed)
Oral Oncology Pharmacist Encounter  Received request from MD to investigate having Pomalyst prescriptions shipped directly to the office in an effort to increase patient adherence and coordinate medication acquisition for a [place] patient.  I contacted [facility] [employer]. They were agreeable to changing shipping [facility] with attention to Dr. Burr Medico.  New prescription for Pomalyst is needed for next fill. Contacted Visual merchandiser, she will work with [place] portal in order to get a new authorization number for patient's prescription and then get a new prescription to [facility] [employer].  Prescription will be sent directly to the office. Dispensing pharmacy will contact collaborative practice RN each month for refill. Collaborative practice RN or MD will reach out to patient to coordinate medication acquisition of each fill of Pomalyst.  Johny Drilling, PharmD, BCPS, BCOP  03/11/2018 3:28 PM [facility] [phone]

## 2018-03-11 NOTE — Telephone Encounter (Signed)
Faxed script and [employer] authorization to [facility] [phone] sent to HIM for scanning to chart.

## 2018-03-13 LAB — MULTIPLE MYELOMA PANEL, SERUM
Albumin SerPl Elph-Mcnc: 3.6 g/dL (ref 2.9–4.4)
Albumin/Glob SerPl: 1.3 (ref 0.7–1.7)
Alpha 1: 0.2 g/dL (ref 0.0–0.4)
Alpha2 Glob SerPl Elph-Mcnc: 0.9 g/dL (ref 0.4–1.0)
B-Globulin SerPl Elph-Mcnc: 0.8 g/dL (ref 0.7–1.3)
Gamma Glob SerPl Elph-Mcnc: 0.9 g/dL (ref 0.4–1.8)
Globulin, Total: 2.8 g/dL (ref 2.2–3.9)
IgA: 162 mg/dL (ref 87–352)
IgG (Immunoglobin G), Serum: 1122 mg/dL (ref 700–1600)
IgM (Immunoglobulin M), Srm: 37 mg/dL (ref 26–217)
Total Protein ELP: 6.4 g/dL (ref 6.0–8.5)

## 2018-03-15 ENCOUNTER — Inpatient Hospital Stay (HOSPITAL_BASED_OUTPATIENT_CLINIC_OR_DEPARTMENT_OTHER): Payer: Medicaid Other | Admitting: Internal Medicine

## 2018-03-15 ENCOUNTER — Telehealth: Payer: Self-pay | Admitting: Internal Medicine

## 2018-03-15 DIAGNOSIS — M5412 Radiculopathy, cervical region: Secondary | ICD-10-CM | POA: Insufficient documentation

## 2018-03-15 DIAGNOSIS — D649 Anemia, unspecified: Secondary | ICD-10-CM

## 2018-03-15 DIAGNOSIS — C9012 Plasma cell leukemia in relapse: Secondary | ICD-10-CM | POA: Diagnosis not present

## 2018-03-15 DIAGNOSIS — E119 Type 2 diabetes mellitus without complications: Secondary | ICD-10-CM

## 2018-03-15 DIAGNOSIS — B181 Chronic viral hepatitis B without delta-agent: Secondary | ICD-10-CM

## 2018-03-15 DIAGNOSIS — M79602 Pain in left arm: Secondary | ICD-10-CM

## 2018-03-15 DIAGNOSIS — Z9221 Personal history of antineoplastic chemotherapy: Secondary | ICD-10-CM

## 2018-03-15 DIAGNOSIS — Z9484 Stem cells transplant status: Secondary | ICD-10-CM

## 2018-03-15 DIAGNOSIS — Z5112 Encounter for antineoplastic immunotherapy: Secondary | ICD-10-CM | POA: Diagnosis not present

## 2018-03-15 DIAGNOSIS — Z79899 Other long term (current) drug therapy: Secondary | ICD-10-CM

## 2018-03-15 DIAGNOSIS — K219 Gastro-esophageal reflux disease without esophagitis: Secondary | ICD-10-CM

## 2018-03-15 MED ORDER — CYCLOBENZAPRINE HCL 5 MG PO TABS: 5.0000 mg | ORAL_TABLET | Freq: Three times a day (TID) | ORAL | 1 refills | Status: DC | PRN

## 2018-03-15 MED ORDER — DEXAMETHASONE 4 MG PO TABS: 4.0000 mg | ORAL_TABLET | Freq: Every day | ORAL | 0 refills | Status: DC

## 2018-03-15 MED FILL — CYCLOBENZAPRINE HCL 5 MG TA: 5 | 10 days supply | Qty: 30 | Fill #0

## 2018-03-15 MED FILL — DEXAMETHASONE 4 MG TABLET: 4 | 5 days supply | Qty: 5 | Fill #0

## 2018-03-15 NOTE — Telephone Encounter (Signed)
Scheduled appt per 2/10 los. ° °Printed calendar and avs. °

## 2018-03-15 NOTE — Progress Notes (Signed)
[facility] [place], [place] [address] [phone]   New Patient Evaluation  Date of Service: 03/15/18 Patient Name: Sheryl [name] Patient MRN: [id number] Patient DOB: 11-16-1970 Provider: Ventura Sellers, MD  Identifying Statement:  Sheryl [name] is a 48 y.o. female with neck and arm pain who presents for initial consultation and evaluation regarding cancer associated neurologic deficits.    Referring Provider: [name], MD [address] 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    10/10/2014 Miscellaneous    Peripheral blood chemistry and leukocytosis with total white count 78K, comprised of large plasma cells and his normocytic anemia. There is a myeloid left shift with previous surgical radium blasts. Flow cytometry showed 64% plasma cells    10/10/2014 Bone Marrow Biopsy    Markedly hypercellular marrow (95%), Atypical plasma cells comprise 57% of the cellularity. There was diminished multilineage in hematopoiesis with adequate maturation. Breasts less than 1%), no overt dysplasia of the myeloid or erythroid lineages.     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    05/07/2015 - 05/12/2015 Hospital Admission    [facility], [facility]  [facility]. ID workup was negative, EGD showed evidence of gastritis and duodenitis, no H. pylori or CMV.    05/20/2015 - 05/24/2015 Hospital Admission    [facility]    07/11/2015 Bone Marrow Biopsy    Post transplant 100 a bone marrow biopsy showed hypocellular marrow, 20%, no increase in plasma cells (2%) or other abnormalities.    08/22/2015 - 01/02/2016 Chemotherapy    MaintenanceCyborD (cytoxan 3057mm2 iv, bortezomib 1.5 mg/m, dexamethasone 40 mg, every 2 weeks, changed to Velcade maintenance after 4 months treatment    01/16/2016 - 04/19/2016 Chemotherapy    Maintenance Velcade 1.3 mg/m every 2 weeks    02/22/2016 Pathology Results    BONE MARROW: Diagnosis Bone Marrow, Aspirate,Biopsy, and Clot, right iliac - NORMOCELLULAR BONE MARROW FOR AGE WITH TRILINEAGE HEMATOPOIESIS. - PLASMACYTOSIS (PLASMA CELLS 12%). - SEE COMMENT. PERIPHERAL BLOOD: - OCCASIONAL CIRCULATING PLASMA CELLS.    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     04/18/2016 - 03/26/2017 Chemotherapy    Daratumumab per protocol  CyBorD every week, cytoxan was held after 04/24/2016 dye to cytopenia and infection  -discontinued due to Hep B flare    05/11/2016 - 05/16/2016 Hospital Admission    Healthcare-associated pneumonia    02/16/2017 - 02/21/2017 Hospital Admission    Admission diagnosis: Abnormal LFT's Additional comments: Assoc diagnoses: Hep B flare, transminitis, dehydration, fever, SIRS    04/06/2017 -  Chemotherapy    Carfilzomib 56 mg/m2 on days 1 and 15 (except 2017m2 on C1D1 and C1D2) with dexamethasone 20 mg on same  days and pomalidomide 2 mg on days 1-21 of 28 day cycle        08/26/2017 - 08/28/2017 Hospital Admission    Admit date: 08/26/2017 Admission diagnosis: RLL Pneumonia      History of Present Illness: The patient's records from the referring physician were obtained  and reviewed and the patient interviewed to confirm this HPI.  Sheryl [name] presents today for assessment of neck and arm pain.  She describes severe pain, radiating from the left side of her neck down to her left should and down to mid-arm.  Pain does not reach fingers.  Discomfort is exacerbated by movement such as head turning or reaching out with arm.  Pain is particularly severe at times in the shoulder, although she is very clear that it radiates down from the neck rather than starting in the shoulder.  She feels "tight" on the left side and range of motion is limited in the arm.  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  Otherwise denies gait impairment or dysuria. Recently obtained an MRI of the neck for review today.  Medications: Current Outpatient Medications on File Prior to Visit  Medication Sig Dispense Refill  . feeding supplement, ENSURE ENLIVE, (ENSURE ENLIVE) LIQD Take 237 mLs by mouth 2 (two) times daily between meals. 237 mL 12  . gabapentin (NEURONTIN) 300 MG capsule Take 1 capsule (300 mg total) by mouth 3 (three) times daily. 60 capsule 0  . ibuprofen (ADVIL,MOTRIN) 200 MG tablet Take 200 mg by mouth every 6 (six) hours as needed for moderate pain.    . magic mouthwash SOLN Take 5 mLs by mouth 4 (four) times daily as needed for mouth pain. 240 mL 3  . methocarbamol (ROBAXIN) 500 MG tablet Take 1 tablet (500 mg total) by mouth every 6 (six) hours as needed for muscle spasms. (Patient not taking: Reported on 03/10/2018) 40 tablet 0  . omeprazole (PRILOSEC) 20 MG capsule Take 1 capsule (20 mg total) by mouth daily. 30 capsule 5  . ondansetron (ZOFRAN ODT) 8 MG disintegrating tablet Take 1 tablet (8 mg total) by mouth every 8 (eight) hours as needed for nausea or vomiting. 40 tablet 1  . oxyCODONE (OXY IR/ROXICODONE) 5 MG immediate release tablet Take 1 tablet (5 mg total) by mouth  every 12 (twelve) hours as needed for severe pain. 15 tablet 0  . polyethylene glycol (MIRALAX / GLYCOLAX) packet Take 17 g by mouth daily as needed (constipation). (Patient not taking: Reported on 02/24/2018) 14 each 1  . pomalidomide (POMALYST) 2 MG capsule Take 1 capsule (2 mg total) by mouth daily. Take with water on days 1-21. Repeat every 28 days. 21 capsule 0  . potassium chloride (KLOR-CON) 20 MEQ packet Take 20 mEq by mouth 2 (two) times daily. 1 pack twice daily for 3 days then 1 pack daily 14 packet 0  . prochlorperazine (COMPAZINE) 10 MG tablet Take 1 tablet (10 mg total) by mouth every 6 (six) hours as needed for nausea or vomiting. 30 tablet 3  . promethazine (PHENERGAN) 25 MG tablet Take 1 tablet (25 mg total) by mouth every 6 (six) hours as needed for nausea or vomiting. 30 tablet 0  . traMADol (ULTRAM) 50 MG tablet Take 1 tablet (50 mg total) by mouth every 6 (six) hours as needed. (Patient not taking: Reported on 03/10/2018) 30 tablet 0  . VIREAD 300  MG tablet Take 1 tablet (300 mg total) by mouth daily. (Patient not taking: Reported on 03/10/2018) 30 tablet 11   Current Facility-Administered Medications on File Prior to Visit  Medication Dose Route Frequency Provider Last Rate Last Dose  . sodium chloride 0.9 % injection 10 mL  10 mL Intravenous PRN Truitt Merle, MD   10 mL at 12/11/15 1532  . sodium chloride 0.9 % injection 10 mL  10 mL Intravenous PRN Truitt Merle, MD   10 mL at 06/11/16 1505  . sodium chloride 0.9 % injection 10 mL  10 mL Intravenous PRN Truitt Merle, MD   10 mL at 08/04/17 1643  . sodium chloride flush (NS) 0.9 % injection 10 mL  10 mL Intravenous PRN Truitt Merle, MD   10 mL at 10/09/15 1717    Allergies: No Known Allergies Past Medical History:  Past Medical History:  Diagnosis Date  . Chills with fever    intermittently since d/c from hospital  . Dysuria-frequency syndrome    w/ pink urine  . GERD (gastroesophageal reflux disease)   . Hepatitis   . History of  positive PPD    DX 2011--  CXR DONE NO EVIDENCE  . History of ureter stent   . Hydronephrosis, right   . Neuromuscular disorder ([facility])    legs numb intermittently  . 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 Pneumonia   . Right ureteral stone   . Urosepsis 8/14   admitted to [facility]   Past Surgical History:  Past Surgical History:  Procedure Laterality Date  . CYSTOSCOPY W/ URETERAL STENT PLACEMENT Right 09/25/2012   Procedure: CYSTOSCOPY WITH RETROGRADE PYELOGRAM/URETERAL STENT PLACEMENT;  Surgeon: Alexis Frock, MD;  Location: [facility];  Service: Urology;  Laterality: Right;  . CYSTOSCOPY WITH RETROGRADE PYELOGRAM, URETEROSCOPY AND STENT PLACEMENT Right 10/15/2012   Procedure: CYSTOSCOPY WITH RETROGRADE PYELOGRAM, URETEROSCOPY AND REMOVAL STENT WITH  STENT PLACEMENT;  Surgeon: Alexis Frock, MD;  Location: [facility];  Service: Urology;  Laterality: Right;  . ESOPHAGOGASTRODUODENOSCOPY (EGD) WITH PROPOFOL N/A 11/16/2014   Procedure: ESOPHAGOGASTRODUODENOSCOPY (EGD) WITH PROPOFOL;  Surgeon: Milus Banister, MD;  Location: [facility];  Service: Endoscopy;  Laterality: N/A;  . HOLMIUM LASER APPLICATION Right 6/80/8811   Procedure: HOLMIUM LASER APPLICATION;  Surgeon: Alexis Frock, MD;  Location: [facility];  Service: Urology;  Laterality: Right;  . LIVER BIOPSY    . OTHER SURGICAL HISTORY Right    removal of ovarian cyst  . removal of uterine cyst     years ago  . RIGHT VATS W/ DRAINAGE PEURAL EFFUSION AND BX'S  10-30-2008   Social History:  Social History   Socioeconomic History  . Marital status: Single    Spouse name: Not on file  . Number of children: 3  . Years of education: Not on file  . Highest education level: Not on file  Occupational History  . Not on file  Social Needs  . Financial resource strain: Not on file  . Food insecurity:    Worry: Not on file    Inability: Not on file  . Transportation needs:    Medical: Not on file     Non-medical: Not on file  Tobacco Use  . Smoking status: Never Smoker  . Smokeless tobacco: Never Used  Substance and Sexual Activity  . Alcohol use: No    Alcohol/week: 0.0 standard drinks  . Drug use: No  . Sexual activity: Not Currently    Birth control/protection: Abstinence  Lifestyle  . Physical activity:    Days per week: Not on file    Minutes per session: Not on file  . Stress: Not on file  Relationships  . Social connections:    Talks on phone: Not on file    Gets together: Not on file    Attends religious service: Not on file    Active member of club or organization: Not on file    Attends meetings of clubs or organizations: Not on file    Relationship status: Not on file  . Intimate partner violence:    Fear of current or ex partner: Not on file    Emotionally abused: Not on file    Physically abused: Not on file    Forced sexual activity: Not on file  Other Topics Concern  . Not on file  Social History Narrative  . Not on file   Family History:  Family History  Problem Relation Age of Onset  . Stomach cancer Mother   . Lung disease Father   . Asthma Father     Review of Systems: Constitutional: Denies fevers, chills or abnormal weight loss Eyes: Denies blurriness of vision Ears, nose, mouth, throat, and face: Denies mucositis or sore throat Respiratory: Denies cough, dyspnea or wheezes Cardiovascular: Denies palpitation, chest discomfort or lower extremity swelling Gastrointestinal:  Denies nausea, constipation, diarrhea GU: Denies dysuria or incontinence Skin: Denies abnormal skin rashes Neurological: Per HPI Musculoskeletal: Per HPI Behavioral/Psych: Denies anxiety, disturbance in thought content, and mood instability   Physical Exam: Vitals:   03/15/18 1051  BP: 134/82  Pulse: 63  Resp: 18  Temp: 97.8 F (36.6 C)  SpO2: 100%   KPS: 80. General: Alert, cooperative, pleasant, in no acute distress Head: Craniotomy scar noted, dry and  intact. EENT: No conjunctival injection or scleral icterus. Oral mucosa moist Lungs: Resp effort normal Cardiac: Regular rate and rhythm Abdomen: Soft, non-distended abdomen Skin: No rashes cyanosis or petechiae. Extremities: No clubbing or edema  Neurologic Exam: Mental Status: Awake, alert, attentive to examiner. Oriented to self and environment. Language is fluent with intact comprehension.  Cranial Nerves: Visual acuity is grossly normal. Visual fields are full. Extra-ocular movements intact. No ptosis. Face is symmetric, tongue midline. Motor: Tone and bulk are normal. Power is full in both arms and legs, with some pain limitation in left arm. Reflexes are symmetric, no pathologic reflexes present. Intact finger to nose bilaterally Sensory: Intact to light touch and temperature Gait: Normal and tandem gait is normal.   Labs: I have reviewed the data as listed    Component Value Date/Time   NA 141 03/10/2018 1221   NA 140 02/02/2017 1317   K 3.6 03/10/2018 1221   K 4.0 02/02/2017 1317   CL 109 03/10/2018 1221   CO2 27 03/10/2018 1221   CO2 25 02/02/2017 1317   GLUCOSE 109 (H) 03/10/2018 1221   GLUCOSE 121 02/02/2017 1317   BUN 16 03/10/2018 1221   BUN 13.8 02/02/2017 1317   CREATININE 0.78 03/10/2018 1221   CREATININE 0.72 03/03/2017 1533   CREATININE 0.8 02/02/2017 1317   CALCIUM 9.2 03/10/2018 1221   CALCIUM 9.3 02/02/2017 1317   PROT 6.9 03/10/2018 1221   PROT 6.9 02/02/2017 1317   ALBUMIN 3.5 03/10/2018 1221   ALBUMIN 3.5 02/02/2017 1317   AST 22 03/10/2018 1221   AST 47 (H) 02/02/2017 1317   ALT <6 03/10/2018 1221   ALT 23 02/02/2017 1317   ALKPHOS 79 03/10/2018 1221  ALKPHOS 98 02/02/2017 1317   BILITOT 0.2 (L) 03/10/2018 1221   BILITOT <0.22 02/02/2017 1317   GFRNONAA >60 03/10/2018 1221   GFRAA >60 03/10/2018 1221   Lab Results  Component Value Date   WBC 3.6 (L) 03/10/2018   NEUTROABS 2.2 03/10/2018   HGB 10.6 (L) 03/10/2018   HCT 33.7 (L)  03/10/2018   MCV 96.3 03/10/2018   PLT 201 03/10/2018    Imaging:  Mr Cervical Spine Wo Contrast  Result Date: 03/09/2018 CLINICAL DATA:  Chronic left shoulder and arm pain. No known injury. EXAM: MRI CERVICAL SPINE WITHOUT CONTRAST TECHNIQUE: Multiplanar, multisequence MR imaging of the cervical spine was performed. No intravenous contrast was administered. COMPARISON:  None. FINDINGS: Alignment: Maintained with straightening of lordosis noted. Convex left scoliosis is seen. Vertebrae: No fracture or worrisome lesion. Cord: Normal signal throughout. Posterior Fossa, vertebral arteries, paraspinal tissues: Negative. Disc levels: C2-3: Negative. C3-4: Mild uncovertebral disease on the right and a minimal bulge. The central canal and left foramen are open. Mild right foraminal narrowing noted. C4-5: Shallow central protrusion with slight caudal extension effaces the ventral thecal sac. The foramina are open. C5-6: Negative. C6-7: Minimal disc bulge and mild facet degenerative change. No stenosis. C7-T1: Negative. IMPRESSION: Mild right foraminal at C3-4 narrowing due to uncovertebral disease. Shallow central protrusion with slight caudal extension at C4-5 effaces the ventral thecal sac. Convex left scoliosis and straightening of the normal cervical lordosis. Electronically Signed   By: Inge Rise M.D.   On: 03/09/2018 15:18    Assessment/Plan 1. Cervical radiculopathy  Ms. Mires presents with clinical syndrome consistent with high cervical radiculopathy, likely C3-5 on the left side.  There are no discernable sensory or motor deficits although exam is limited by pain.  No brachial plexopathy signs.   She does have a modest disc protrusion at C3-4 with minimal foraminal stenosis.  This may be the underlying trigger, exacerbated by her lifestyle and workload, with subsequent soft tissue irritation and inflammation.    We recommended first treating conservatively, with plenty of rest and staying  out of work.  We recommended a short course of decadron (17m daily x5 days) for inflammation, as well as Flexiril 56mTID PRN for pain and tightness.  Prior muscle relaxant wasn't effective, although she has not had meaningful period of rest.    We asked that she return in 9 days when she is due for her next treatment with Dr. FeBurr Medicofor a re-evaluation.  If not improved, we will consider EMG at that time.  We appreciate the opportunity to participate in the care of Sheryl [name].   All questions were answered. The patient knows to call the clinic with any problems, questions or concerns. No barriers to learning were detected.  The total time spent in the encounter was 40 minutes and more than 50% was on counseling and review of test results   ZaVentura SellersMD Medical Director of Neuro-Oncology [facility] [place]2/10/20 4:35 PM

## 2018-03-17 ENCOUNTER — Telehealth: Payer: Self-pay

## 2018-03-17 NOTE — Telephone Encounter (Signed)
Spoke with patient's daughter [name] regarding shipment of Pomalyst.  Let her know that Dr. Burr Medico has the patient's medication here for pick up. She states she can probably come and pick it up in the morning, instructed her to come to front desk and ask for Dr. Ernestina Penna nurse.  She verbalized an understanding.  Note:  Medication is in locked cabinet at nurses desk.

## 2018-03-18 ENCOUNTER — Telehealth: Payer: Self-pay

## 2018-03-18 NOTE — Telephone Encounter (Signed)
Patient picked up Pomalyst from our office today, had her sign the packing slip, sent to HIM for scanning to chart.   Instructed her to start taking today.  She agreed.

## 2018-03-24 ENCOUNTER — Inpatient Hospital Stay (HOSPITAL_BASED_OUTPATIENT_CLINIC_OR_DEPARTMENT_OTHER): Payer: Medicaid Other | Admitting: Internal Medicine

## 2018-03-24 ENCOUNTER — Other Ambulatory Visit: Payer: Medicaid Other

## 2018-03-24 ENCOUNTER — Inpatient Hospital Stay: Payer: Medicaid Other

## 2018-03-24 ENCOUNTER — Ambulatory Visit: Payer: Medicaid Other | Admitting: Internal Medicine

## 2018-03-24 VITALS — BP 122/77 | HR 83 | Temp 97.6°F | Resp 18 | Ht 62.0 in | Wt 136.1 lb

## 2018-03-24 DIAGNOSIS — K219 Gastro-esophageal reflux disease without esophagitis: Secondary | ICD-10-CM

## 2018-03-24 DIAGNOSIS — Z5112 Encounter for antineoplastic immunotherapy: Secondary | ICD-10-CM | POA: Diagnosis not present

## 2018-03-24 DIAGNOSIS — C9010 Plasma cell leukemia not having achieved remission: Secondary | ICD-10-CM

## 2018-03-24 DIAGNOSIS — Z79899 Other long term (current) drug therapy: Secondary | ICD-10-CM

## 2018-03-24 DIAGNOSIS — C9012 Plasma cell leukemia in relapse: Secondary | ICD-10-CM | POA: Diagnosis not present

## 2018-03-24 DIAGNOSIS — D649 Anemia, unspecified: Secondary | ICD-10-CM

## 2018-03-24 DIAGNOSIS — Z95828 Presence of other vascular implants and grafts: Secondary | ICD-10-CM

## 2018-03-24 DIAGNOSIS — B181 Chronic viral hepatitis B without delta-agent: Secondary | ICD-10-CM

## 2018-03-24 DIAGNOSIS — Z9484 Stem cells transplant status: Secondary | ICD-10-CM

## 2018-03-24 DIAGNOSIS — M5412 Radiculopathy, cervical region: Secondary | ICD-10-CM | POA: Diagnosis not present

## 2018-03-24 DIAGNOSIS — Z9221 Personal history of antineoplastic chemotherapy: Secondary | ICD-10-CM

## 2018-03-24 DIAGNOSIS — E119 Type 2 diabetes mellitus without complications: Secondary | ICD-10-CM

## 2018-03-24 DIAGNOSIS — M79602 Pain in left arm: Secondary | ICD-10-CM

## 2018-03-24 LAB — CBC WITH DIFFERENTIAL (CANCER CENTER ONLY)
Abs Immature Granulocytes: 0.18 10*3/uL — ABNORMAL HIGH (ref 0.00–0.07)
BASOS ABS: 0 10*3/uL (ref 0.0–0.1)
Basophils Relative: 0 %
Eosinophils Absolute: 0.1 10*3/uL (ref 0.0–0.5)
Eosinophils Relative: 1 %
HCT: 38.7 % (ref 36.0–46.0)
Hemoglobin: 12.2 g/dL (ref 12.0–15.0)
Immature Granulocytes: 3 %
Lymphocytes Relative: 11 %
Lymphs Abs: 0.8 10*3/uL (ref 0.7–4.0)
MCH: 30 pg (ref 26.0–34.0)
MCHC: 31.5 g/dL (ref 30.0–36.0)
MCV: 95.1 fL (ref 80.0–100.0)
Monocytes Absolute: 0.5 10*3/uL (ref 0.1–1.0)
Monocytes Relative: 7 %
NRBC: 0 % (ref 0.0–0.2)
Neutro Abs: 5.3 10*3/uL (ref 1.7–7.7)
Neutrophils Relative %: 78 %
Platelet Count: 228 10*3/uL (ref 150–400)
RBC: 4.07 MIL/uL (ref 3.87–5.11)
RDW: 13.1 % (ref 11.5–15.5)
WBC Count: 6.8 10*3/uL (ref 4.0–10.5)

## 2018-03-24 LAB — CMP (CANCER CENTER ONLY)
ALT: 18 U/L (ref 0–44)
AST: 30 U/L (ref 15–41)
Albumin: 3.4 g/dL — ABNORMAL LOW (ref 3.5–5.0)
Alkaline Phosphatase: 97 U/L (ref 38–126)
Anion gap: 8 (ref 5–15)
BUN: 17 mg/dL (ref 6–20)
CO2: 26 mmol/L (ref 22–32)
Calcium: 9.6 mg/dL (ref 8.9–10.3)
Chloride: 104 mmol/L (ref 98–111)
Creatinine: 0.75 mg/dL (ref 0.44–1.00)
GFR, Estimated: >60 mL/min (ref >60)
Glucose, Bld: 138 mg/dL — ABNORMAL HIGH (ref 70–99)
Potassium: 4 mmol/L (ref 3.5–5.1)
Sodium: 138 mmol/L (ref 135–145)
Total Bilirubin: 0.2 mg/dL — ABNORMAL LOW (ref 0.3–1.2)
Total Protein: 7 g/dL (ref 6.5–8.1)

## 2018-03-24 MED ORDER — SODIUM CHLORIDE 0.9 % IV SOLN
Freq: Once | INTRAVENOUS | Status: AC
  Administered 2018-03-24: 12:00:00 via INTRAVENOUS
  Filled 2018-03-24: qty 250

## 2018-03-24 MED ORDER — DEXTROSE 5 % IV SOLN
56.0000 mg/m2 | Freq: Once | INTRAVENOUS | Status: AC
  Administered 2018-03-24: 90 mg via INTRAVENOUS
  Filled 2018-03-24: qty 15

## 2018-03-24 MED ORDER — HEPARIN SOD (PORK) LOCK FLUSH 100 UNIT/ML IV SOLN
500.0000 [IU] | Freq: Once | INTRAVENOUS | Status: AC | PRN
  Administered 2018-03-24: 500 [IU]
  Filled 2018-03-24: qty 5

## 2018-03-24 MED ORDER — PALONOSETRON HCL INJECTION 0.25 MG/5ML
INTRAVENOUS | Status: AC
  Filled 2018-03-24: qty 5

## 2018-03-24 MED ORDER — SODIUM CHLORIDE 0.9 % IV SOLN
Freq: Once | INTRAVENOUS | Status: AC
  Administered 2018-03-24: 11:00:00 via INTRAVENOUS
  Filled 2018-03-24: qty 250

## 2018-03-24 MED ORDER — SODIUM CHLORIDE 0.9% FLUSH
10.0000 mL | INTRAVENOUS | Status: DC | PRN
  Administered 2018-03-24: 10 mL
  Filled 2018-03-24: qty 10

## 2018-03-24 MED ORDER — DEXAMETHASONE SODIUM PHOSPHATE 10 MG/ML IJ SOLN
INTRAMUSCULAR | Status: AC
  Filled 2018-03-24: qty 1

## 2018-03-24 MED ORDER — PROCHLORPERAZINE MALEATE 10 MG PO TABS
10.0000 mg | ORAL_TABLET | Freq: Once | ORAL | Status: AC
  Administered 2018-03-24: 10 mg via ORAL

## 2018-03-24 MED ORDER — DEXAMETHASONE SODIUM PHOSPHATE 10 MG/ML IJ SOLN
8.0000 mg | Freq: Once | INTRAMUSCULAR | Status: AC
  Administered 2018-03-24: 8 mg via INTRAVENOUS

## 2018-03-24 MED ORDER — PALONOSETRON HCL INJECTION 0.25 MG/5ML
0.2500 mg | Freq: Once | INTRAVENOUS | Status: AC
  Administered 2018-03-24: 0.25 mg via INTRAVENOUS

## 2018-03-24 MED ORDER — PROCHLORPERAZINE MALEATE 10 MG PO TABS
ORAL_TABLET | ORAL | Status: AC
  Filled 2018-03-24: qty 1

## 2018-03-24 NOTE — Progress Notes (Signed)
[facility] [address] [place], [place] [address] [phone]   Interval Evaluation  Date of Service: 03/24/18 Patient Name: Sheryl [name] Patient MRN: [id number] Patient DOB: Mar 03, 1970 Provider: Ventura Sellers, MD  Identifying Statement:  Sheryl [name] is a 48 y.o. 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    10/10/2014 Miscellaneous    Peripheral blood chemistry and leukocytosis with total white count 78K, comprised of large plasma cells and his normocytic anemia. There is a myeloid left shift with previous surgical radium blasts. Flow cytometry showed 64% plasma cells    10/10/2014 Bone Marrow Biopsy    Markedly hypercellular marrow (95%), Atypical plasma cells comprise 57% of the cellularity. There was diminished multilineage in hematopoiesis with adequate maturation. Breasts less than 1%), no overt dysplasia of the myeloid or erythroid lineages.     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    05/07/2015 - 05/12/2015 Hospital Admission    [facility], [facility] ID workup was negative, EGD showed evidence of gastritis and duodenitis, no H. pylori or CMV.    05/20/2015 - 05/24/2015 Hospital Ad[facility]    [facility]    07/11/2015 Bone Marrow Biopsy    Post transplant 100 a bone marrow biopsy showed hypocellular marrow, 20%, no increase in plasma cells (2%) or other abnormalities.    08/22/2015 - 01/02/2016 Chemotherapy    MaintenanceCyborD (cytoxan 3043mm2 iv, bortezomib 1.5 mg/m, dexamethasone 40 mg, every 2 weeks, changed to Velcade maintenance after 4 months treatment    01/16/2016 - 04/19/2016 Chemotherapy    Maintenance Velcade 1.3 mg/m every 2 weeks    02/22/2016 Pathology Results    BONE MARROW: Diagnosis Bone Marrow, Aspirate,Biopsy, and Clot, right iliac - NORMOCELLULAR BONE MARROW FOR AGE WITH TRILINEAGE HEMATOPOIESIS. - PLASMACYTOSIS (PLASMA CELLS 12%). - SEE COMMENT. PERIPHERAL BLOOD: - OCCASIONAL CIRCULATING PLASMA CELLS.    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     04/18/2016 - 03/26/2017 Chemotherapy    Daratumumab per protocol  CyBorD every week, cytoxan was held after 04/24/2016 dye to cytopenia and infection  -discontinued due to Hep B flare    05/11/2016 - 05/16/2016 Hospital Admission    Healthcare-associated pneumonia    02/16/2017 - 02/21/2017 Hospital Admission    Admission diagnosis: Abnormal LFT's Additional comments: Assoc diagnoses: Hep B flare, transminitis, dehydration, fever, SIRS    04/06/2017 -  Chemotherapy    Carfilzomib 56 mg/m2 on days 1 and 15 (except 2072m2 on C1D1 and C1D2) with dexamethasone 20 mg on same days and pomalidomide 2 mg on days 1-21 of 28 day cycle        08/26/2017 - 08/28/2017 Hospital Admission    Admit date: 08/26/2017 Admission  diagnosis: RLL Pneumonia      Interval History: Sheryl [name] presents today for follow up.  She reports considerable improvement in symptoms since dosing steroids and resting at home outside of work.  She still has modest pain when she lifts the left arm.  For symptoms she continues to take Flexiril 3x per day as  needed, and Tylenol for breakthrough pain.  Occasionally she will need an oxycodone at night.     Sheryl [name] presents today for assessment of neck and arm pain.  She describes severe pain, radiating from the left side of her neck down to her left should and down to mid-arm.  Pain does not reach fingers.  Discomfort is exacerbated by movement such as head turning or reaching out with arm.  Pain is particularly severe at times in the shoulder, although she is very clear that it radiates down from the neck rather than starting in the shoulder.  She feels "tight" on the left side and range of motion is limited in the arm.  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  Otherwise denies gait impairment or dysuria. Recently obtained an MRI of the neck for review today.  Medications: Current Outpatient Medications on File Prior to Visit  Medication Sig Dispense Refill  . cyclobenzaprine (FLEXERIL) 5 MG tablet Take 1 tablet (5 mg total) by mouth 3 (three) times daily as needed for muscle spasms. 30 tablet 1  . dexamethasone (DECADRON) 4 MG tablet Take 1 tablet (4 mg total) by mouth daily. 5 tablet 0  . feeding supplement, ENSURE ENLIVE, (ENSURE ENLIVE) LIQD Take 237 mLs by mouth 2 (two) times daily between meals. 237 mL 12  . gabapentin (NEURONTIN) 300 MG capsule Take 1 capsule (300 mg total) by mouth 3 (three) times daily. 60 capsule 0  . ibuprofen (ADVIL,MOTRIN) 200 MG tablet Take 200 mg by mouth every 6 (six) hours as needed for moderate pain.    . magic mouthwash SOLN Take 5 mLs by mouth 4 (four) times daily as needed for mouth pain. 240 mL 3  . methocarbamol (ROBAXIN) 500 MG tablet Take 1 tablet (500 mg total) by mouth every 6 (six) hours as needed for muscle spasms. (Patient not taking: Reported on 03/10/2018) 40 tablet 0  . omeprazole (PRILOSEC) 20 MG capsule Take 1 capsule (20 mg total) by mouth daily. 30  capsule 5  . ondansetron (ZOFRAN ODT) 8 MG disintegrating tablet Take 1 tablet (8 mg total) by mouth every 8 (eight) hours as needed for nausea or vomiting. 40 tablet 1  . oxyCODONE (OXY IR/ROXICODONE) 5 MG immediate release tablet Take 1 tablet (5 mg total) by mouth every 12 (twelve) hours as needed for severe pain. 15 tablet 0  . polyethylene glycol (MIRALAX / GLYCOLAX) packet Take 17 g by mouth daily as needed (constipation). (Patient not taking: Reported on 02/24/2018) 14 each 1  . pomalidomide (POMALYST) 2 MG capsule Take 1 capsule (2 mg total) by mouth daily. Take with water on days 1-21. Repeat every 28 days. 21 capsule 0  . potassium chloride (KLOR-CON) 20 MEQ packet Take 20 mEq by mouth 2 (two) times daily. 1 pack twice daily for 3 days then 1 pack daily 14 packet 0  . prochlorperazine (COMPAZINE) 10 MG tablet Take 1 tablet (10 mg total) by mouth every 6 (six) hours as needed for nausea or vomiting. 30 tablet 3  .  promethazine (PHENERGAN) 25 MG tablet Take 1 tablet (25 mg total) by mouth every 6 (six) hours as needed for nausea or vomiting. 30 tablet 0  . traMADol (ULTRAM) 50 MG tablet Take 1 tablet (50 mg total) by mouth every 6 (six) hours as needed. (Patient not taking: Reported on 03/10/2018) 30 tablet 0  . VIREAD 300 MG tablet Take 1 tablet (300 mg total) by mouth daily. (Patient not taking: Reported on 03/10/2018) 30 tablet 11   Current Facility-Administered Medications on File Prior to Visit  Medication Dose Route Frequency Provider Last Rate Last Dose  . sodium chloride 0.9 % injection 10 mL  10 mL Intravenous PRN Truitt Merle, MD   10 mL at 12/11/15 1532  . sodium chloride 0.9 % injection 10 mL  10 mL Intravenous PRN Truitt Merle, MD   10 mL at 06/11/16 1505  . sodium chloride 0.9 % injection 10 mL  10 mL Intravenous PRN Truitt Merle, MD   10 mL at 08/04/17 1643  . sodium chloride flush (NS) 0.9 % injection 10 mL  10 mL Intravenous PRN Truitt Merle, MD   10 mL at 10/09/15 1717    Allergies: No  Known Allergies Past Medical History:  Past Medical History:  Diagnosis Date  . Chills with fever    intermittently since d/c from hospital  . Dysuria-frequency syndrome    w/ pink urine  . GERD (gastroesophageal reflux disease)   . Hepatitis   . History of positive PPD    DX 2011--  CXR DONE NO EVIDENCE  . History of ureter stent   . Hydronephrosis, right   . Neuromuscular disorder ([facility])    legs numb intermittently  . 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 Pneumonia   . Right ureteral stone   . Urosepsis 8/14   ad[facility]   Past Surgical History:  Past Surgical History:  Procedure Laterality Date  . CYSTOSCOPY W/ URETERAL STENT PLACEMENT Right 09/25/2012   Procedure: CYSTOSCOPY WITH RETROGRADE PYELOGRAM/URETERAL STENT PLACEMENT;  Surgeon: Alexis Frock, MD;  Location: [facility];  Service: Urology;  Laterality: Right;  . CYSTOSCOPY WITH RETROGRADE PYELOGRAM, URETEROSCOPY AND STENT PLACEMENT Right 10/15/2012   Procedure: CYSTOSCOPY WITH RETROGRADE PYELOGRAM, URETEROSCOPY AND REMOVAL STENT WITH  STENT PLACEMENT;  Surgeon: Alexis Frock, MD;  Location: [facility];  Service: Urology;  Laterality: Right;  . ESOPHAGOGASTRODUODENOSCOPY (EGD) WITH PROPOFOL N/A 11/16/2014   Procedure: ESOPHAGOGASTRODUODENOSCOPY (EGD) WITH PROPOFOL;  Surgeon: Milus Banister, MD;  Location: [facility];  Service: Endoscopy;  Laterality: N/A;  . HOLMIUM LASER APPLICATION Right 1/54/0086   Procedure: HOLMIUM LASER APPLICATION;  Surgeon: Alexis Frock, MD;  Location: [facility];  Service: Urology;  Laterality: Right;  . LIVER BIOPSY    . OTHER SURGICAL HISTORY Right    removal of ovarian cyst  . removal of uterine cyst     years ago  . RIGHT VATS W/ DRAINAGE PEURAL EFFUSION AND BX'S  10-30-2008   Social History:  Social History   Socioeconomic History  . Marital status: Single    Spouse name: Not on file  . Number of children: 3  . Years of education: Not on file  .  Highest education level: Not on file  Occupational History  . Not on file  Social Needs  . Financial resource strain: Not on file  . Food insecurity:    Worry: Not on file    Inability: Not on file  . Transportation needs:  Medical: Not on file    Non-medical: Not on file  Tobacco Use  . Smoking status: Never Smoker  . Smokeless tobacco: Never Used  Substance and Sexual Activity  . Alcohol use: No    Alcohol/week: 0.0 standard drinks  . Drug use: No  . Sexual activity: Not Currently    Birth control/protection: Abstinence  Lifestyle  . Physical activity:    Days per week: Not on file    Minutes per session: Not on file  . Stress: Not on file  Relationships  . Social connections:    Talks on phone: Not on file    Gets together: Not on file    Attends religious service: Not on file    Active member of club or organization: Not on file    Attends meetings of clubs or organizations: Not on file    Relationship status: Not on file  . Intimate partner violence:    Fear of current or ex partner: Not on file    Emotionally abused: Not on file    Physically abused: Not on file    Forced sexual activity: Not on file  Other Topics Concern  . Not on file  Social History Narrative  . Not on file   Family History:  Family History  Problem Relation Age of Onset  . Stomach cancer Mother   . Lung disease Father   . Asthma Father     Review of Systems: Constitutional: Denies fevers, chills or abnormal weight loss Eyes: Denies blurriness of vision Ears, nose, mouth, throat, and face: Denies mucositis or sore throat Respiratory: Denies cough, dyspnea or wheezes Cardiovascular: Denies palpitation, chest discomfort or lower extremity swelling Gastrointestinal:  Denies nausea, constipation, diarrhea GU: Denies dysuria or incontinence Skin: Denies abnormal skin rashes Neurological: Per HPI Musculoskeletal: Per HPI Behavioral/Psych: Denies anxiety, disturbance in thought  content, and mood instability   Physical Exam: Vitals:   03/24/18 1046  BP: 122/77  Pulse: 83  Resp: 18  Temp: 97.6 F (36.4 C)  SpO2: 100%   KPS: 80. General: Alert, cooperative, pleasant, in no acute distress Head: Craniotomy scar noted, dry and intact. EENT: No conjunctival injection or scleral icterus. Oral mucosa moist Lungs: Resp effort normal Cardiac: Regular rate and rhythm Abdomen: Soft, non-distended abdomen Skin: No rashes cyanosis or petechiae. Extremities: No clubbing or edema  Neurologic Exam: Mental Status: Awake, alert, attentive to examiner. Oriented to self and environment. Language is fluent with intact comprehension.  Cranial Nerves: Visual acuity is grossly normal. Visual fields are full. Extra-ocular movements intact. No ptosis. Face is symmetric, tongue midline. Motor: Tone and bulk are normal. Power is full in both arms and legs, with some pain limitation in left arm. Reflexes are symmetric, no pathologic reflexes present. Intact finger to nose bilaterally Sensory: Intact to light touch and temperature Gait: Normal and tandem gait is normal.   Labs: I have reviewed the data as listed    Component Value Date/Time   NA 141 03/10/2018 1221   NA 140 02/02/2017 1317   K 3.6 03/10/2018 1221   K 4.0 02/02/2017 1317   CL 109 03/10/2018 1221   CO2 27 03/10/2018 1221   CO2 25 02/02/2017 1317   GLUCOSE 109 (H) 03/10/2018 1221   GLUCOSE 121 02/02/2017 1317   BUN 16 03/10/2018 1221   BUN 13.8 02/02/2017 1317   CREATININE 0.78 03/10/2018 1221   CREATININE 0.72 03/03/2017 1533   CREATININE 0.8 02/02/2017 1317   CALCIUM 9.2 03/10/2018 1221  CALCIUM 9.3 02/02/2017 1317   PROT 6.9 03/10/2018 1221   PROT 6.9 02/02/2017 1317   ALBUMIN 3.5 03/10/2018 1221   ALBUMIN 3.5 02/02/2017 1317   AST 22 03/10/2018 1221   AST 47 (H) 02/02/2017 1317   ALT <6 03/10/2018 1221   ALT 23 02/02/2017 1317   ALKPHOS 79 03/10/2018 1221   ALKPHOS 98 02/02/2017 1317    BILITOT 0.2 (L) 03/10/2018 1221   BILITOT <0.22 02/02/2017 1317   GFRNONAA >60 03/10/2018 1221   GFRAA >60 03/10/2018 1221   Lab Results  Component Value Date   WBC 3.6 (L) 03/10/2018   NEUTROABS 2.2 03/10/2018   HGB 10.6 (L) 03/10/2018   HCT 33.7 (L) 03/10/2018   MCV 96.3 03/10/2018   PLT 201 03/10/2018    Imaging:  Mr Cervical Spine Wo Contrast  Result Date: 03/09/2018 CLINICAL DATA:  Chronic left shoulder and arm pain. No known injury. EXAM: MRI CERVICAL SPINE WITHOUT CONTRAST TECHNIQUE: Multiplanar, multisequence MR imaging of the cervical spine was performed. No intravenous contrast was administered. COMPARISON:  None. FINDINGS: Alignment: Maintained with straightening of lordosis noted. Convex left scoliosis is seen. Vertebrae: No fracture or worrisome lesion. Cord: Normal signal throughout. Posterior Fossa, vertebral arteries, paraspinal tissues: Negative. Disc levels: C2-3: Negative. C3-4: Mild uncovertebral disease on the right and a minimal bulge. The central canal and left foramen are open. Mild right foraminal narrowing noted. C4-5: Shallow central protrusion with slight caudal extension effaces the ventral thecal sac. The foramina are open. C5-6: Negative. C6-7: Minimal disc bulge and mild facet degenerative change. No stenosis. C7-T1: Negative. IMPRESSION: Mild right foraminal at C3-4 narrowing due to uncovertebral disease. Shallow central protrusion with slight caudal extension at C4-5 effaces the ventral thecal sac. Convex left scoliosis and straightening of the normal cervical lordosis. Electronically Signed   By: Inge Rise M.D.   On: 03/09/2018 15:18    Assessment/Plan 1. Cervical radiculopathy  Ms. Gosa presents with clinical syndrome consistent with high cervical radiculopathy, likely C3-5 on the left side.  There are no discernable sensory or motor deficits although exam is limited by pain.  No brachial plexopathy signs.  She is clinically improved today,  although still with some discomfort.  She has completed her course of steroids.   We recommended she continue Flexiril TID PRN, and replace Tylenol with Ibuprofen for breakthrough analgesia given anti-inflammatory properties.  We also recommended continuing to emphasize rest, and returning to work gradually.  She may return to clinic as needed with additional or progressive symptoms.  We appreciate the opportunity to participate in the care of Sheryl [name].   All questions were answered. The patient knows to call the clinic with any problems, questions or concerns. No barriers to learning were detected.  The total time spent in the encounter was 25 minutes and more than 50% was on counseling and review of test results   Ventura Sellers, MD Medical Director of Neuro-Oncology [facility] at [place] 03/24/18 10:40 AM

## 2018-03-24 NOTE — Patient Instructions (Signed)
[facility] Discharge Instructions for Patients Receiving Chemotherapy  Today you received the following chemotherapy agents:  Kyprolis  To help prevent nausea and vomiting after your treatment, we encourage you to take your nausea medication as directed.   If you develop nausea and vomiting that is not controlled by your nausea medication, call the clinic.   BELOW ARE SYMPTOMS THAT SHOULD BE REPORTED IMMEDIATELY:  *FEVER GREATER THAN 100.5 F  *CHILLS WITH OR WITHOUT FEVER  NAUSEA AND VOMITING THAT IS NOT CONTROLLED WITH YOUR NAUSEA MEDICATION  *UNUSUAL SHORTNESS OF BREATH  *UNUSUAL BRUISING OR BLEEDING  TENDERNESS IN MOUTH AND THROAT WITH OR WITHOUT PRESENCE OF ULCERS  *URINARY PROBLEMS  *BOWEL PROBLEMS  UNUSUAL RASH Items with * indicate a potential emergency and should be followed up as soon as possible.  Feel free to call the clinic should you have any questions or concerns. The clinic phone number is [phone].  Please show the CHEMO ALERT CARD at check-in to the Emergency Department and triage nurse.   

## 2018-03-25 ENCOUNTER — Telehealth: Payer: Self-pay | Admitting: Internal Medicine

## 2018-03-25 NOTE — Telephone Encounter (Signed)
No 02/19 los °

## 2018-04-05 NOTE — Progress Notes (Signed)
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    10/10/2014 Miscellaneous    Peripheral blood chemistry and leukocytosis with total white count 78K, comprised of large plasma cells and his normocytic anemia. There is a myeloid left shift with previous surgical radium blasts. Flow cytometry showed 64% plasma cells    10/10/2014 Bone Marrow Biopsy    Markedly hypercellular marrow (95%), Atypical plasma cells comprise 57% of the cellularity. There was diminished multilineage in hematopoiesis with adequate maturation. Breasts less than 1%), no overt dysplasia of the myeloid or erythroid lineages.     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    05/07/2015 - 05/12/2015 Hospital Admission    [facility], [facility]. ID  workup was negative, EGD showed evidence of gastritis and duodenitis, no H. pylori or CMV.    05/20/2015 - 05/24/2015 Hospital Admission    [facility]    07/11/2015 Bone Marrow Biopsy    Post transplant 100 a bone marrow biopsy showed hypocellular marrow, 20%, no increase in plasma cells (2%) or other abnormalities.    08/22/2015 - 01/02/2016 Chemotherapy    MaintenanceCyborD (cytoxan 3063mm2 iv, bortezomib 1.5 mg/m, dexamethasone 40 mg, every 2 weeks, changed to Velcade maintenance after 4 months treatment    01/16/2016 - 04/19/2016 Chemotherapy    Maintenance Velcade 1.3 mg/m every 2 weeks    02/22/2016 Pathology Results    BONE MARROW: Diagnosis Bone Marrow, Aspirate,Biopsy, and Clot, right iliac - NORMOCELLULAR BONE MARROW FOR AGE WITH TRILINEAGE HEMATOPOIESIS. - PLASMACYTOSIS (PLASMA CELLS 12%). - SEE COMMENT. PERIPHERAL BLOOD: - OCCASIONAL CIRCULATING PLASMA CELLS.    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     04/18/2016 - 03/26/2017 Chemotherapy    Daratumumab per protocol  CyBorD every week, cytoxan was held after 04/24/2016 dye to cytopenia and infection  -discontinued due to Hep B flare    05/11/2016 - 05/16/2016 Hospital Admission    Healthcare-associated pneumonia    02/16/2017 - 02/21/2017 Hospital Admission    Admission diagnosis: Abnormal LFT's Additional comments: Assoc diagnoses: Hep B flare, transminitis, dehydration, fever, SIRS    04/06/2017 -  Chemotherapy    Carfilzomib 56 mg/m2 on days 1 and 15 (except 2037m2 on C1D1 and C1D2) with dexamethasone 20 mg on same days and pomalidomide 2 mg on days 1-21 of  28 day cycle        08/26/2017 - 08/28/2017 Hospital Admission    Admit date: 08/26/2017 Admission diagnosis: RLL Pneumonia       CURRENT THERAPY:  Carfilzomib 56 mg/m2 on days 1 and 15(except 87m/m2 on C1D1 and C1D2)with dexamethasone 20  mg on same days and pomalidomide 2 mg on days 1-21 of 28 day cycleon 04/13/17, dexa stopped on 07/06/2017 due to her CR -She restarted her Pomalyst in 03/2018 (off for 2-3 months due to insurance, delivery issues)  INTERVAL HISTORY:  Sheryl [name] is here for a follow up of treatment. She presents to the clinic today with her interpreter. She notes she is doing well. She saw Dr. VMickeal Skinnerfor her shoulder and neck pain which is now resolved after 5 days dose of steroids. She notes she has not checked her BG for changes. She recently saw Dr. RNorma Fredricksonand will repeat barrow marrow biopsy to check to see if she has any signs of cancer in 2 weeks. She notes her last cycle infusion went well and her nausea has improved.    REVIEW OF SYSTEMS:   Constitutional: Denies fevers, chills or abnormal weight loss Eyes: Denies blurriness of vision Ears, nose, mouth, throat, and face: Denies mucositis or sore throat Respiratory: Denies cough, dyspnea or wheezes Cardiovascular: Denies palpitation, chest discomfort or lower extremity swelling Gastrointestinal:  Denies nausea, heartburn or change in bowel habits Skin: Denies abnormal skin rashes Lymphatics: Denies new lymphadenopathy or easy bruising Neurological:Denies numbness, tingling or new weaknesses Behavioral/Psych: Mood is stable, no new changes  All other systems were reviewed with the patient and are negative.  MEDICAL HISTORY:  Past Medical History:  Diagnosis Date  . Chills with fever    intermittently since d/c from hospital  . Dysuria-frequency syndrome    w/ pink urine  . GERD (gastroesophageal reflux disease)   . Hepatitis   . History of positive PPD    DX 2011--  CXR DONE NO EVIDENCE  . History of ureter stent   . Hydronephrosis, right   . Neuromuscular disorder ([facility])    legs numb intermittently  . 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[place]   . Pneumonia   . Right ureteral stone   . Urosepsis 8/14   [facility]    SURGICAL HISTORY: Past Surgical  History:  Procedure Laterality Date  . CYSTOSCOPY W/ URETERAL STENT PLACEMENT Right 09/25/2012   Procedure: CYSTOSCOPY WITH RETROGRADE PYELOGRAM/URETERAL STENT PLACEMENT;  Surgeon: TAlexis Frock MD;  Location: [facility];  Service: Urology;  Laterality: Right;  . CYSTOSCOPY WITH RETROGRADE PYELOGRAM, URETEROSCOPY AND STENT PLACEMENT Right 10/15/2012   Procedure: CYSTOSCOPY WITH RETROGRADE PYELOGRAM, URETEROSCOPY AND REMOVAL STENT WITH  STENT PLACEMENT;  Surgeon: TAlexis Frock MD;  [facility]  Service: Urology;  Laterality: Right;  . ESOPHAGOGASTRODUODENOSCOPY (EGD) WITH PROPOFOL N/A 11/16/2014   Procedure: ESOPHAGOGASTRODUODENOSCOPY (EGD) WITH PROPOFOL;  Surgeon: DMilus Banister MD;  Location: [facility];  Service: Endoscopy;  Laterality: N/A;  . HOLMIUM LASER APPLICATION Right 96/23/7628  Procedure: HOLMIUM LASER APPLICATION;  Surgeon: TAlexis Frock MD;  [facility]  Service: Urology;  Laterality: Right;  . LIVER BIOPSY    . OTHER SURGICAL HISTORY Right    removal of ovarian cyst  . removal of uterine cyst     years ago  . RIGHT VATS W/ DRAINAGE PEURAL EFFUSION AND BX'S  10-30-2008    I have reviewed the social history and family history with the patient and they  are unchanged from previous note.  ALLERGIES:  has No Known Allergies.  MEDICATIONS:  Current Outpatient Medications  Medication Sig Dispense Refill  . acyclovir (ZOVIRAX) 800 MG tablet Take 800 mg by mouth 2 (two) times daily.    . feeding supplement, ENSURE ENLIVE, (ENSURE ENLIVE) LIQD Take 237 mLs by mouth 2 (two) times daily between meals. 237 mL 12  . ibuprofen (ADVIL,MOTRIN) 200 MG tablet Take 200 mg by mouth every 6 (six) hours as needed for moderate pain.    Marland Kitchen omeprazole (PRILOSEC) 20 MG capsule Take 1 capsule (20 mg total) by mouth daily. 30 capsule 5  . polyethylene glycol (MIRALAX / GLYCOLAX) packet Take 17 g by mouth daily as needed (constipation). 14 each 1    . pomalidomide (POMALYST) 2 MG capsule Take 1 capsule (2 mg total) by mouth daily. Take with water on days 1-21. Repeat every 28 days. 21 capsule 0  . VIREAD 300 MG tablet Take 1 tablet (300 mg total) by mouth daily. 30 tablet 11  . cyclobenzaprine (FLEXERIL) 5 MG tablet Take 1 tablet (5 mg total) by mouth 3 (three) times daily as needed for muscle spasms. (Patient not taking: Reported on 04/07/2018) 30 tablet 1  . dexamethasone (DECADRON) 4 MG tablet Take 1 tablet (4 mg total) by mouth daily. (Patient not taking: Reported on 03/24/2018) 5 tablet 0  . gabapentin (NEURONTIN) 300 MG capsule Take 1 capsule (300 mg total) by mouth 3 (three) times daily. (Patient not taking: Reported on 03/24/2018) 60 capsule 0  . magic mouthwash SOLN Take 5 mLs by mouth 4 (four) times daily as needed for mouth pain. (Patient not taking: Reported on 03/24/2018) 240 mL 3  . ondansetron (ZOFRAN ODT) 8 MG disintegrating tablet Take 1 tablet (8 mg total) by mouth every 8 (eight) hours as needed for nausea or vomiting. (Patient not taking: Reported on 03/24/2018) 40 tablet 1  . oxyCODONE (OXY IR/ROXICODONE) 5 MG immediate release tablet Take 1 tablet (5 mg total) by mouth every 12 (twelve) hours as needed for severe pain. (Patient not taking: Reported on 03/24/2018) 15 tablet 0  . potassium chloride (KLOR-CON) 20 MEQ packet Take 20 mEq by mouth 2 (two) times daily. 1 pack twice daily for 3 days then 1 pack daily (Patient not taking: Reported on 03/24/2018) 14 packet 0  . prochlorperazine (COMPAZINE) 10 MG tablet Take 1 tablet (10 mg total) by mouth every 6 (six) hours as needed for nausea or vomiting. (Patient not taking: Reported on 03/24/2018) 30 tablet 3  . promethazine (PHENERGAN) 25 MG tablet Take 1 tablet (25 mg total) by mouth every 6 (six) hours as needed for nausea or vomiting. (Patient not taking: Reported on 03/24/2018) 30 tablet 0  . traMADol (ULTRAM) 50 MG tablet Take 1 tablet (50 mg total) by mouth every 6 (six) hours as  needed. (Patient not taking: Reported on 03/10/2018) 30 tablet 0   No current facility-administered medications for this visit.    Facility-Administered Medications Ordered in Other Visits  Medication Dose Route Frequency Provider Last Rate Last Dose  . 0.9 %  sodium chloride infusion   Intravenous Once Truitt Merle, MD      . carfilzomib (KYPROLIS) 90 mg in dextrose 5 % 100 mL chemo infusion  56 mg/m2 (Treatment Plan Recorded) Intravenous Once Truitt Merle, MD      . heparin lock flush 100 unit/mL  500 Units Intracatheter Once PRN Truitt Merle, MD      . sodium chloride 0.9 % injection  10 mL  10 mL Intravenous PRN Truitt Merle, MD   10 mL at 12/11/15 1532  . sodium chloride 0.9 % injection 10 mL  10 mL Intravenous PRN Truitt Merle, MD   10 mL at 06/11/16 1505  . sodium chloride 0.9 % injection 10 mL  10 mL Intravenous PRN Truitt Merle, MD   10 mL at 08/04/17 1643  . sodium chloride flush (NS) 0.9 % injection 10 mL  10 mL Intravenous PRN Truitt Merle, MD   10 mL at 10/09/15 1717  . sodium chloride flush (NS) 0.9 % injection 10 mL  10 mL Intracatheter PRN Truitt Merle, MD        PHYSICAL EXAMINATION: ECOG PERFORMANCE STATUS: 1 - Symptomatic but completely ambulatory  Vitals:   04/07/18 1015  BP: 114/76  Pulse: 77  Resp: 18  Temp: 97.7 F (36.5 C)  SpO2: 100%   Filed Weights   04/07/18 1015  Weight: 139 lb 4.8 oz (63.2 kg)    GENERAL:alert, no distress and comfortable SKIN: skin color, texture, turgor are normal, no rashes or significant lesions EYES: normal, Conjunctiva are pink and non-injected, sclera clear OROPHARYNX:no exudate, no erythema and lips, buccal mucosa, and tongue normal  NECK: supple, thyroid normal size, non-tender, without nodularity LYMPH:  no palpable lymphadenopathy in the cervical, axillary or inguinal LUNGS: clear to auscultation and percussion with normal breathing effort HEART: regular rate & rhythm and no murmurs and no lower extremity edema ABDOMEN:abdomen soft, non-tender  and normal bowel sounds Musculoskeletal:no cyanosis of digits and no clubbing  NEURO: alert & oriented x 3 with fluent speech, no focal motor/sensory deficits  LABORATORY DATA:  I have reviewed the data as listed CBC Latest Ref Rng & Units 04/07/2018 03/24/2018 03/10/2018  WBC 4.0 - 10.5 K/uL 3.2(L) 6.8 3.6(L)  Hemoglobin 12.0 - 15.0 g/dL 10.3(L) 12.2 10.6(L)  Hematocrit 36.0 - 46.0 % 33.3(L) 38.7 33.7(L)  Platelets 150 - 400 K/uL 190 228 201     CMP Latest Ref Rng & Units 04/07/2018 03/24/2018 03/10/2018  Glucose 70 - 99 mg/dL 118(H) 138(H) 109(H)  BUN 6 - 20 mg/dL '18 17 16  ' Creatinine 0.44 - 1.00 mg/dL 0.75 0.75 0.78  Sodium 135 - 145 mmol/L 140 138 141  Potassium 3.5 - 5.1 mmol/L 3.7 4.0 3.6  Chloride 98 - 111 mmol/L 106 104 109  CO2 22 - 32 mmol/L '27 26 27  ' Calcium 8.9 - 10.3 mg/dL 9.0 9.6 9.2  Total Protein 6.5 - 8.1 g/dL 6.9 7.0 6.9  Total Bilirubin 0.3 - 1.2 mg/dL 0.3 0.2(L) 0.2(L)  Alkaline Phos 38 - 126 U/L 90 97 79  AST 15 - 41 U/L '21 30 22  ' ALT 0 - 44 U/L 6 18 <6      RADIOGRAPHIC STUDIES: I have personally reviewed the radiological images as listed and agreed with the findings in the report. No results found.   ASSESSMENT & PLAN:  Sheryl [name] is a 48 y.o. female with   1. 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 She was treated with CyborD and bone marrow transplant. She progressed on maintenance Velcade in 02/2016. She was treated with more Dara and CyborD, which was stopped after a severe Hep B flare. -She is currently being treatedwith Carfilzomib and Pomalyst.She is tolerating well overall. -She run out Pomalyst for 2-3 months and restarted Pomalyst in 03/2018. She completed her first cycle dose today. Continue to ship her medication to [facility] for  her to pick up. Will refill today to start cycle 2 next week.  -Labs revewied, CBC WBC except WBC at 3.2, Hg at 10.3, CMP WNL. MM panel is still panel. Her M protein has been  negative, she is still in remission. Overall adequate to proceed with IVCarfilzomibtoday  -She is overdue for Zometa injection, will proceed with infusion today. She has received for 2 years, will stop after today's dose  -She will follow up with Dr. Norma Fredrickson in 2 weeks for repeat bone marrow biopsy.  -I will continue to follow up with her every other treatment, f/u in 4 weeks    2.  Cervical radiculopathy -Ongoing since late 12/2017 but got worse  -She denies history of neck trauma or injury -Her MRI Spine from 03/09/18 showed mild cervical disk bulging, but overall benign. This is likely nerve related.  -she was evaluated by neurologist Dr. Mickeal Skinner in our cancer center, and received a 5 days of steroids, much improved now.   3.Chronichepatitis B infection -Shehas knownchronichepatitis B infection,developed significant transaminitis and hyperbilirubinemiainJanuary 2019 -Sherestartedher Heb Btreatmentin 02/2017, controlled now  -She will continue to be closely followed by ID Dr. Baxter Flattery   4. Type 2 diabetes, steroids induced -Previouslytreated with Metformin.No longer ondexamethasone. -Has much improved, Glucose normal lately.    5. GERD, history of GI bleeding and nausea -Colonoscopy on 11/26/16 per Dr. Ardis Hughs notable for a polyp in the transverse colon, revealed to be tubular adenoma and negative for high grade dysplasia or malignancy.  -Continue omeprazole daily. We previously discussed steroids can worsen her acid reflux -Nausea is much improved.   Plan -Refill Pomalyst today, she will start next cycle in a week   -Labs reviewed and adequate to proceed with IVCarfilzomibtodayand continue every 2 weeks -Proceed with Zometa injection today (last dose)  -F/u in 4 weeks  -Proceed with bone marrow biopsy in 2 weeks at [facility] with Dr. Norma Fredrickson     No problem-specific Assessment & Plan notes found for this encounter.   No orders of the defined types  were placed in this encounter.  All questions were answered. The patient knows to call the clinic with any problems, questions or concerns. No barriers to learning was detected. I spent 20 minutes counseling the patient face to face. The total time spent in the appointment was 25 minutes and more than 50% was on counseling and review of test results     Truitt Merle, MD 04/07/2018   I, Joslyn Devon, am acting as scribe for Truitt Merle, MD.   I have reviewed the above documentation for accuracy and completeness, and I agree with the above.

## 2018-04-07 ENCOUNTER — Inpatient Hospital Stay: Payer: Medicaid Other

## 2018-04-07 ENCOUNTER — Other Ambulatory Visit: Payer: Self-pay | Admitting: *Deleted

## 2018-04-07 ENCOUNTER — Inpatient Hospital Stay: Payer: Medicaid Other | Attending: Hematology

## 2018-04-07 ENCOUNTER — Inpatient Hospital Stay (HOSPITAL_BASED_OUTPATIENT_CLINIC_OR_DEPARTMENT_OTHER): Payer: Medicaid Other | Admitting: Hematology

## 2018-04-07 ENCOUNTER — Telehealth: Payer: Self-pay | Admitting: Hematology

## 2018-04-07 ENCOUNTER — Encounter: Payer: Self-pay | Admitting: Hematology

## 2018-04-07 VITALS — BP 114/76 | HR 77 | Temp 97.7°F | Resp 18 | Ht 62.0 in | Wt 139.3 lb

## 2018-04-07 DIAGNOSIS — K219 Gastro-esophageal reflux disease without esophagitis: Secondary | ICD-10-CM | POA: Diagnosis not present

## 2018-04-07 DIAGNOSIS — E119 Type 2 diabetes mellitus without complications: Secondary | ICD-10-CM | POA: Diagnosis not present

## 2018-04-07 DIAGNOSIS — C9012 Plasma cell leukemia in relapse: Secondary | ICD-10-CM | POA: Diagnosis not present

## 2018-04-07 DIAGNOSIS — M5412 Radiculopathy, cervical region: Secondary | ICD-10-CM | POA: Insufficient documentation

## 2018-04-07 DIAGNOSIS — E099 Drug or chemical induced diabetes mellitus without complications: Secondary | ICD-10-CM | POA: Diagnosis not present

## 2018-04-07 DIAGNOSIS — Z5112 Encounter for antineoplastic immunotherapy: Secondary | ICD-10-CM | POA: Diagnosis not present

## 2018-04-07 DIAGNOSIS — Z95828 Presence of other vascular implants and grafts: Secondary | ICD-10-CM

## 2018-04-07 DIAGNOSIS — D649 Anemia, unspecified: Secondary | ICD-10-CM

## 2018-04-07 DIAGNOSIS — C9010 Plasma cell leukemia not having achieved remission: Secondary | ICD-10-CM | POA: Insufficient documentation

## 2018-04-07 DIAGNOSIS — B181 Chronic viral hepatitis B without delta-agent: Secondary | ICD-10-CM | POA: Diagnosis not present

## 2018-04-07 LAB — CMP (CANCER CENTER ONLY)
ALBUMIN: 3.2 g/dL — AB (ref 3.5–5.0)
ALT: 6 U/L (ref 0–44)
AST: 21 U/L (ref 15–41)
Alkaline Phosphatase: 90 U/L (ref 38–126)
Anion gap: 7 (ref 5–15)
BILIRUBIN TOTAL: 0.3 mg/dL (ref 0.3–1.2)
BUN: 18 mg/dL (ref 6–20)
CO2: 27 mmol/L (ref 22–32)
Calcium: 9 mg/dL (ref 8.9–10.3)
Chloride: 106 mmol/L (ref 98–111)
Creatinine: 0.75 mg/dL (ref 0.44–1.00)
GFR, Est AFR Am: >60 mL/min (ref >60)
GFR, Estimated: >60 mL/min (ref >60)
Glucose, Bld: 118 mg/dL — ABNORMAL HIGH (ref 70–99)
Potassium: 3.7 mmol/L (ref 3.5–5.1)
Sodium: 140 mmol/L (ref 135–145)
Total Protein: 6.9 g/dL (ref 6.5–8.1)

## 2018-04-07 LAB — CBC WITH DIFFERENTIAL (CANCER CENTER ONLY)
Abs Immature Granulocytes: 0.02 10*3/uL (ref 0.00–0.07)
BASOS ABS: 0 10*3/uL (ref 0.0–0.1)
BASOS PCT: 1 %
Eosinophils Absolute: 0 10*3/uL (ref 0.0–0.5)
Eosinophils Relative: 1 %
HCT: 33.3 % — ABNORMAL LOW (ref 36.0–46.0)
Hemoglobin: 10.3 g/dL — ABNORMAL LOW (ref 12.0–15.0)
Immature Granulocytes: 1 %
Lymphocytes Relative: 28 %
Lymphs Abs: 0.9 10*3/uL (ref 0.7–4.0)
MCH: 29.8 pg (ref 26.0–34.0)
MCHC: 30.9 g/dL (ref 30.0–36.0)
MCV: 96.2 fL (ref 80.0–100.0)
Monocytes Absolute: 0.5 10*3/uL (ref 0.1–1.0)
Monocytes Relative: 15 %
NRBC: 0 % (ref 0.0–0.2)
Neutro Abs: 1.8 10*3/uL (ref 1.7–7.7)
Neutrophils Relative %: 54 %
PLATELETS: 190 10*3/uL (ref 150–400)
RBC: 3.46 MIL/uL — ABNORMAL LOW (ref 3.87–5.11)
RDW: 13.2 % (ref 11.5–15.5)
WBC Count: 3.2 10*3/uL — ABNORMAL LOW (ref 4.0–10.5)

## 2018-04-07 LAB — PREGNANCY, URINE: Preg Test, Ur: NEGATIVE

## 2018-04-07 MED ORDER — ZOLEDRONIC ACID 4 MG/100ML IV SOLN
4.0000 mg | Freq: Once | INTRAVENOUS | Status: AC
  Administered 2018-04-07: 4 mg via INTRAVENOUS
  Filled 2018-04-07: qty 100

## 2018-04-07 MED ORDER — SODIUM CHLORIDE 0.9% FLUSH
10.0000 mL | INTRAVENOUS | Status: DC | PRN
  Administered 2018-04-07: 10 mL via INTRAVENOUS
  Filled 2018-04-07: qty 10

## 2018-04-07 MED ORDER — POMALIDOMIDE 2 MG PO CAPS: 2.0000 mg | ORAL_CAPSULE | Freq: Every day | ORAL | 0 refills | Status: DC

## 2018-04-07 MED ORDER — SODIUM CHLORIDE 0.9% FLUSH
10.0000 mL | INTRAVENOUS | Status: DC | PRN
  Administered 2018-04-07: 10 mL
  Filled 2018-04-07: qty 10

## 2018-04-07 MED ORDER — DEXTROSE 5 % IV SOLN
56.0000 mg/m2 | Freq: Once | INTRAVENOUS | Status: AC
  Administered 2018-04-07: 90 mg via INTRAVENOUS
  Filled 2018-04-07: qty 30

## 2018-04-07 MED ORDER — PROCHLORPERAZINE MALEATE 10 MG PO TABS
ORAL_TABLET | ORAL | Status: AC
  Filled 2018-04-07: qty 1

## 2018-04-07 MED ORDER — DEXAMETHASONE SODIUM PHOSPHATE 10 MG/ML IJ SOLN
8.0000 mg | Freq: Once | INTRAMUSCULAR | Status: AC
  Administered 2018-04-07: 8 mg via INTRAVENOUS

## 2018-04-07 MED ORDER — PALONOSETRON HCL INJECTION 0.25 MG/5ML
INTRAVENOUS | Status: AC
  Filled 2018-04-07: qty 5

## 2018-04-07 MED ORDER — DEXAMETHASONE SODIUM PHOSPHATE 10 MG/ML IJ SOLN
INTRAMUSCULAR | Status: AC
  Filled 2018-04-07: qty 1

## 2018-04-07 MED ORDER — SODIUM CHLORIDE 0.9 % IV SOLN
Freq: Once | INTRAVENOUS | Status: AC
  Administered 2018-04-07: 11:00:00 via INTRAVENOUS
  Filled 2018-04-07: qty 250

## 2018-04-07 MED ORDER — HEPARIN SOD (PORK) LOCK FLUSH 100 UNIT/ML IV SOLN
500.0000 [IU] | Freq: Once | INTRAVENOUS | Status: AC | PRN
  Administered 2018-04-07: 500 [IU]
  Filled 2018-04-07: qty 5

## 2018-04-07 MED ORDER — SODIUM CHLORIDE 0.9 % IV SOLN
Freq: Once | INTRAVENOUS | Status: DC
  Filled 2018-04-07: qty 250

## 2018-04-07 MED ORDER — PALONOSETRON HCL INJECTION 0.25 MG/5ML
0.2500 mg | Freq: Once | INTRAVENOUS | Status: AC
  Administered 2018-04-07: 0.25 mg via INTRAVENOUS

## 2018-04-07 MED ORDER — PROCHLORPERAZINE MALEATE 10 MG PO TABS
10.0000 mg | ORAL_TABLET | Freq: Once | ORAL | Status: AC
  Administered 2018-04-07: 10 mg via ORAL

## 2018-04-07 MED FILL — OMEPRAZOLE 20 MG CPDR: 20 | 30 days supply | Qty: 30 | Fill #3

## 2018-04-07 NOTE — Telephone Encounter (Signed)
Scheduled appt per 3/4 los. ° °Printed calendar and avs. °

## 2018-04-07 NOTE — Patient Instructions (Signed)
[facility] Discharge Instructions for Patients Receiving Chemotherapy  Today you received the following chemotherapy agents:  Kyprolis  To help prevent nausea and vomiting after your treatment, we encourage you to take your nausea medication as directed.   If you develop nausea and vomiting that is not controlled by your nausea medication, call the clinic.   BELOW ARE SYMPTOMS THAT SHOULD BE REPORTED IMMEDIATELY:  *FEVER GREATER THAN 100.5 F  *CHILLS WITH OR WITHOUT FEVER  NAUSEA AND VOMITING THAT IS NOT CONTROLLED WITH YOUR NAUSEA MEDICATION  *UNUSUAL SHORTNESS OF BREATH  *UNUSUAL BRUISING OR BLEEDING  TENDERNESS IN MOUTH AND THROAT WITH OR WITHOUT PRESENCE OF ULCERS  *URINARY PROBLEMS  *BOWEL PROBLEMS  UNUSUAL RASH Items with * indicate a potential emergency and should be followed up as soon as possible.  Feel free to call the clinic should you have any questions or concerns. The clinic phone number is [phone].  Please show the CHEMO ALERT CARD at check-in to the Emergency Department and triage nurse.   

## 2018-04-07 NOTE — Progress Notes (Signed)
VO from Dr Burr Medico to give final dose of zometa 4mg  today.

## 2018-04-08 LAB — KAPPA/LAMBDA LIGHT CHAINS
Kappa free light chain: 54.5 mg/L — ABNORMAL HIGH (ref 3.3–19.4)
Kappa, lambda light chain ratio: 1.1 (ref 0.26–1.65)
Lambda free light chains: 49.6 mg/L — ABNORMAL HIGH (ref 5.7–26.3)

## 2018-04-12 ENCOUNTER — Telehealth: Payer: Self-pay

## 2018-04-12 NOTE — Telephone Encounter (Signed)
Received notification from [facility] [place] that patient's Pomalyst will ship today to arrive tomorrow at our office.  Will call patient's daughter when it arrives for pick up.

## 2018-04-13 LAB — MULTIPLE MYELOMA PANEL, SERUM
ALPHA 1: 0.2 g/dL (ref 0.0–0.4)
Albumin SerPl Elph-Mcnc: 3.3 g/dL (ref 2.9–4.4)
Albumin/Glob SerPl: 1.2 (ref 0.7–1.7)
Alpha2 Glob SerPl Elph-Mcnc: 0.7 g/dL (ref 0.4–1.0)
B-Globulin SerPl Elph-Mcnc: 1 g/dL (ref 0.7–1.3)
Gamma Glob SerPl Elph-Mcnc: 1.1 g/dL (ref 0.4–1.8)
Globulin, Total: 3 g/dL (ref 2.2–3.9)
IGG (IMMUNOGLOBIN G), SERUM: 1204 mg/dL (ref 700–1600)
IgA: 236 mg/dL (ref 87–352)
IgM (Immunoglobulin M), Srm: 53 mg/dL (ref 26–217)
Total Protein ELP: 6.3 g/dL (ref 6.0–8.5)

## 2018-04-14 ENCOUNTER — Telehealth: Payer: Self-pay

## 2018-04-14 MED FILL — TENOFOVIR DISOPROXIL FUMARA: 300 | 30 days supply | Qty: 30 | Fill #2

## 2018-04-14 NOTE — Telephone Encounter (Signed)
Patient picked up Pomalyst signed for at [place] on 04/14/2018, sent to HIM for scanning to chart.

## 2018-04-14 NOTE — Telephone Encounter (Signed)
Spoke with patient's daughter Pomalyst here at our office ready for pick up.  She verbalized an understanding.

## 2018-04-21 ENCOUNTER — Inpatient Hospital Stay: Payer: Medicaid Other

## 2018-04-21 ENCOUNTER — Other Ambulatory Visit: Payer: Self-pay

## 2018-04-21 VITALS — BP 128/73 | HR 75 | Temp 98.3°F | Resp 18 | Ht 62.0 in | Wt 140.5 lb

## 2018-04-21 DIAGNOSIS — C9010 Plasma cell leukemia not having achieved remission: Secondary | ICD-10-CM

## 2018-04-21 DIAGNOSIS — Z5112 Encounter for antineoplastic immunotherapy: Secondary | ICD-10-CM | POA: Diagnosis not present

## 2018-04-21 DIAGNOSIS — Z95828 Presence of other vascular implants and grafts: Secondary | ICD-10-CM

## 2018-04-21 LAB — CMP (CANCER CENTER ONLY)
ALBUMIN: 3.1 g/dL — AB (ref 3.5–5.0)
ALT: 10 U/L (ref 0–44)
AST: 24 U/L (ref 15–41)
Alkaline Phosphatase: 115 U/L (ref 38–126)
Anion gap: 8 (ref 5–15)
BUN: 16 mg/dL (ref 6–20)
CO2: 25 mmol/L (ref 22–32)
CREATININE: 0.72 mg/dL (ref 0.44–1.00)
Calcium: 8.8 mg/dL — ABNORMAL LOW (ref 8.9–10.3)
Chloride: 107 mmol/L (ref 98–111)
GFR, Est AFR Am: >60 mL/min (ref >60)
GFR, Estimated: >60 mL/min (ref >60)
Glucose, Bld: 119 mg/dL — ABNORMAL HIGH (ref 70–99)
Potassium: 3.9 mmol/L (ref 3.5–5.1)
Sodium: 140 mmol/L (ref 135–145)
Total Bilirubin: 0.2 mg/dL — ABNORMAL LOW (ref 0.3–1.2)
Total Protein: 6.7 g/dL (ref 6.5–8.1)

## 2018-04-21 LAB — CBC WITH DIFFERENTIAL (CANCER CENTER ONLY)
Abs Immature Granulocytes: 0.08 10*3/uL — ABNORMAL HIGH (ref 0.00–0.07)
Basophils Absolute: 0.1 10*3/uL (ref 0.0–0.1)
Basophils Relative: 1 %
EOS PCT: 1 %
Eosinophils Absolute: 0.1 10*3/uL (ref 0.0–0.5)
HEMATOCRIT: 33.8 % — AB (ref 36.0–46.0)
HEMOGLOBIN: 10.6 g/dL — AB (ref 12.0–15.0)
Immature Granulocytes: 1 %
LYMPHS ABS: 0.9 10*3/uL (ref 0.7–4.0)
Lymphocytes Relative: 15 %
MCH: 30.3 pg (ref 26.0–34.0)
MCHC: 31.4 g/dL (ref 30.0–36.0)
MCV: 96.6 fL (ref 80.0–100.0)
Monocytes Absolute: 0.3 10*3/uL (ref 0.1–1.0)
Monocytes Relative: 4 %
Neutro Abs: 4.7 10*3/uL (ref 1.7–7.7)
Neutrophils Relative %: 78 %
Platelet Count: 277 10*3/uL (ref 150–400)
RBC: 3.5 MIL/uL — ABNORMAL LOW (ref 3.87–5.11)
RDW: 13.4 % (ref 11.5–15.5)
WBC Count: 6 10*3/uL (ref 4.0–10.5)
nRBC: 0 % (ref 0.0–0.2)

## 2018-04-21 MED ORDER — SODIUM CHLORIDE 0.9% FLUSH
10.0000 mL | INTRAVENOUS | Status: DC | PRN
  Administered 2018-04-21: 10 mL
  Filled 2018-04-21: qty 10

## 2018-04-21 MED ORDER — PROCHLORPERAZINE MALEATE 10 MG PO TABS
ORAL_TABLET | ORAL | Status: AC
  Filled 2018-04-21: qty 1

## 2018-04-21 MED ORDER — DEXAMETHASONE SODIUM PHOSPHATE 10 MG/ML IJ SOLN
8.0000 mg | Freq: Once | INTRAMUSCULAR | Status: AC
  Administered 2018-04-21: 8 mg via INTRAVENOUS

## 2018-04-21 MED ORDER — PROCHLORPERAZINE MALEATE 10 MG PO TABS
10.0000 mg | ORAL_TABLET | Freq: Once | ORAL | Status: AC
  Administered 2018-04-21: 10 mg via ORAL

## 2018-04-21 MED ORDER — SODIUM CHLORIDE 0.9 % IV SOLN
Freq: Once | INTRAVENOUS | Status: AC
  Administered 2018-04-21: 11:00:00 via INTRAVENOUS
  Filled 2018-04-21: qty 250

## 2018-04-21 MED ORDER — PALONOSETRON HCL INJECTION 0.25 MG/5ML
INTRAVENOUS | Status: AC
  Filled 2018-04-21: qty 5

## 2018-04-21 MED ORDER — DEXTROSE 5 % IV SOLN
56.0000 mg/m2 | Freq: Once | INTRAVENOUS | Status: AC
  Administered 2018-04-21: 90 mg via INTRAVENOUS
  Filled 2018-04-21: qty 15

## 2018-04-21 MED ORDER — DEXAMETHASONE SODIUM PHOSPHATE 10 MG/ML IJ SOLN
INTRAMUSCULAR | Status: AC
  Filled 2018-04-21: qty 1

## 2018-04-21 MED ORDER — SODIUM CHLORIDE 0.9% FLUSH
10.0000 mL | INTRAVENOUS | Status: DC | PRN
  Administered 2018-04-21: 10 mL via INTRAVENOUS
  Filled 2018-04-21: qty 10

## 2018-04-21 MED ORDER — HEPARIN SOD (PORK) LOCK FLUSH 100 UNIT/ML IV SOLN
500.0000 [IU] | Freq: Once | INTRAVENOUS | Status: AC | PRN
  Administered 2018-04-21: 500 [IU]
  Filled 2018-04-21: qty 5

## 2018-04-21 MED ORDER — PALONOSETRON HCL INJECTION 0.25 MG/5ML
0.2500 mg | Freq: Once | INTRAVENOUS | Status: AC
  Administered 2018-04-21: 0.25 mg via INTRAVENOUS

## 2018-04-21 NOTE — Patient Instructions (Signed)
[facility] Discharge Instructions for Patients Receiving Chemotherapy  Today you received the following chemotherapy agents:  Kyprolis  To help prevent nausea and vomiting after your treatment, we encourage you to take your nausea medication as directed.   If you develop nausea and vomiting that is not controlled by your nausea medication, call the clinic.   BELOW ARE SYMPTOMS THAT SHOULD BE REPORTED IMMEDIATELY:  *FEVER GREATER THAN 100.5 F  *CHILLS WITH OR WITHOUT FEVER  NAUSEA AND VOMITING THAT IS NOT CONTROLLED WITH YOUR NAUSEA MEDICATION  *UNUSUAL SHORTNESS OF BREATH  *UNUSUAL BRUISING OR BLEEDING  TENDERNESS IN MOUTH AND THROAT WITH OR WITHOUT PRESENCE OF ULCERS  *URINARY PROBLEMS  *BOWEL PROBLEMS  UNUSUAL RASH Items with * indicate a potential emergency and should be followed up as soon as possible.  Feel free to call the clinic should you have any questions or concerns. The clinic phone number is [phone].  Please show the CHEMO ALERT CARD at check-in to the Emergency Department and triage nurse.   

## 2018-05-03 NOTE — Progress Notes (Signed)
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    10/10/2014 Miscellaneous    Peripheral blood chemistry and leukocytosis with total white count 78K, comprised of large plasma cells and his normocytic anemia. There is a myeloid left shift with previous surgical radium blasts. Flow cytometry showed 64% plasma cells    10/10/2014 Bone Marrow Biopsy    Markedly hypercellular marrow (95%), Atypical plasma cells comprise 57% of the cellularity. There was diminished multilineage in hematopoiesis with adequate maturation. Breasts less than 1%), no overt dysplasia of the myeloid or erythroid lineages.     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    05/07/2015 - 05/12/2015 Hospital Admission    [facility], [facility] ID  workup was negative, EGD showed evidence of gastritis and duodenitis, no H. pylori or CMV.    05/20/2015 - 05/24/2015 Hospital Admission    [facility]    07/11/2015 Bone Marrow Biopsy    Post transplant 100 a bone marrow biopsy showed hypocellular marrow, 20%, no increase in plasma cells (2%) or other abnormalities.    08/22/2015 - 01/02/2016 Chemotherapy    MaintenanceCyborD (cytoxan 3043mm2 iv, bortezomib 1.5 mg/m, dexamethasone 40 mg, every 2 weeks, changed to Velcade maintenance after 4 months treatment    01/16/2016 - 04/19/2016 Chemotherapy    Maintenance Velcade 1.3 mg/m every 2 weeks    02/22/2016 Pathology Results    BONE MARROW: Diagnosis Bone Marrow, Aspirate,Biopsy, and Clot, right iliac - NORMOCELLULAR BONE MARROW FOR AGE WITH TRILINEAGE HEMATOPOIESIS. - PLASMACYTOSIS (PLASMA CELLS 12%). - SEE COMMENT. PERIPHERAL BLOOD: - OCCASIONAL CIRCULATING PLASMA CELLS.    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     04/18/2016 - 03/26/2017 Chemotherapy    Daratumumab per protocol  CyBorD every week, cytoxan was held after 04/24/2016 dye to cytopenia and infection  -discontinued due to Hep B flare    05/11/2016 - 05/16/2016 Hospital Admission    Healthcare-associated pneumonia    02/16/2017 - 02/21/2017 Hospital Admission    Admission diagnosis: Abnormal LFT's Additional comments: Assoc diagnoses: Hep B flare, transminitis, dehydration, fever, SIRS    04/06/2017 -  Chemotherapy    Carfilzomib 56 mg/m2 on days 1 and 15(except 2077m2 on C1D1 and C1D2)with dexamethasone 20 mg on same days and pomalidomide 2 mg on days 1-21 of 28 day  cycleon 04/13/17, dexa stopped on 07/06/2017 due to her CR  -She restarted her Pomalyst in 03/2018 (off for 2-3 months due to insurance, delivery issues)    08/26/2017 - 08/28/2017 Hospital Admission    Admit date: 08/26/2017 Admission diagnosis:  RLL Pneumonia     04/19/2018 Bone Marrow Biopsy    Bone Marrow Biopsy 04/19/18 at [facility] Bone Marrow (BM) and Peripheral Blood (PB) FINAL PATHOLOGIC DIAGNOSIS BONE MARROW: Hypocellular marrow (20%) with trilineage hematopoiesis. No [plasma cell myeloma identified. See comment. PERIPHERAL BLOOD: Mild anemia. No circulating plasma cells seen. See CBC data and comment.       CURRENT THERAPY:  Carfilzomib 56 mg/m2 on days 1 and 15(except 8m/m2 on C1D1 and C1D2)with dexamethasone 20 mg on same days and pomalidomide 2 mg on days 1-21 of 28 day cycleon 04/13/17, dexa stopped on 07/06/2017 due to her CR -She restarted her Pomalyst in 03/2018 (off for 2-3 months due to insurance, delivery issues)  INTERVAL HISTORY:  Sheryl [name] is here for a follow up of treatment. She presents to the clinic today with her electronic stratus vietnamese interpretor. She notes she is doing well. She notes her visit with Dr. RNorma Fredricksonwas cancelled and she has rescheduled for later.  She notes she has felt fatigued and weak lately. He notes her appetite and eating is still adequate. She feels this is related to the Biopsy.  She denies issues with current treatment. She denies cough or chest discomfort. She notes she ran out of Pomalyst and completed her 3 weeks dose. She needs a refill.    REVIEW OF SYSTEMS:   Constitutional: Denies fevers, chills or abnormal weight loss (+) Fatigue and weakness  Eyes: Denies blurriness of vision Ears, nose, mouth, throat, and face: Denies mucositis or sore throat Respiratory: Denies cough, dyspnea or wheezes Cardiovascular: Denies palpitation, chest discomfort or lower extremity swelling Gastrointestinal:  Denies nausea, heartburn or change in bowel habits Skin: Denies abnormal skin rashes Lymphatics: Denies new lymphadenopathy or easy bruising Neurological:Denies numbness, tingling or new weaknesses Behavioral/Psych: Mood is stable, no new  changes  All other systems were reviewed with the patient and are negative.  MEDICAL HISTORY:  Past Medical History:  Diagnosis Date  . Chills with fever    intermittently since d/c from hospital  . Dysuria-frequency syndrome    w/ pink urine  . GERD (gastroesophageal reflux disease)   . Hepatitis   . History of positive PPD    DX 2011--  CXR DONE NO EVIDENCE  . History of ureter stent   . Hydronephrosis, right   . Neuromuscular disorder (H[facility])    legs numb intermittently  . 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   . Pneumonia   . Right ureteral stone   . Urosepsis 8/14   ad[facility] to [facility]    SURGICAL HISTORY: Past Surgical History:  Procedure Laterality Date  . CYSTOSCOPY W/ URETERAL STENT PLACEMENT Right 09/25/2012   Procedure: CYSTOSCOPY WITH RETROGRADE PYELOGRAM/URETERAL STENT PLACEMENT;  Surgeon: TAlexis Frock MD;  Location: [facility];  Service: Urology;  Laterality: Right;  . CYSTOSCOPY WITH RETROGRADE PYELOGRAM, URETEROSCOPY AND STENT PLACEMENT Right 10/15/2012   Procedure: CYSTOSCOPY WITH RETROGRADE PYELOGRAM, URETEROSCOPY AND REMOVAL STENT WITH  STENT PLACEMENT;  Surgeon: TAlexis Frock MD;  [facility]  Service: Urology;  Laterality: Right;  . ESOPHAGOGASTRODUODENOSCOPY (EGD) WITH PROPOFOL N/A 11/16/2014   Procedure: ESOPHAGOGASTRODUODENOSCOPY (EGD) WITH PROPOFOL;  Surgeon: DMilus Banister MD;  Location: [facility];  Service: Endoscopy;  Laterality: N/A;  . HOLMIUM  LASER APPLICATION Right 7/82/9562   Procedure: HOLMIUM LASER APPLICATION;  Surgeon: Alexis Frock, MD;  Location: [facility];  Service: Urology;  Laterality: Right;  . LIVER BIOPSY    . OTHER SURGICAL HISTORY Right    removal of ovarian cyst  . removal of uterine cyst     years ago  . RIGHT VATS W/ DRAINAGE PEURAL EFFUSION AND BX'S  10-30-2008    I have reviewed the social history and family history with the patient and they are unchanged from previous note.  ALLERGIES:   has No Known Allergies.  MEDICATIONS:  Current Outpatient Medications  Medication Sig Dispense Refill  . acyclovir (ZOVIRAX) 800 MG tablet Take 1 tablet (800 mg total) by mouth 2 (two) times daily. 60 tablet 3  . cyclobenzaprine (FLEXERIL) 5 MG tablet Take 1 tablet (5 mg total) by mouth 3 (three) times daily as needed for muscle spasms. 30 tablet 1  . dexamethasone (DECADRON) 4 MG tablet Take 1 tablet (4 mg total) by mouth daily. 5 tablet 0  . feeding supplement, ENSURE ENLIVE, (ENSURE ENLIVE) LIQD Take 237 mLs by mouth 2 (two) times daily between meals. 237 mL 12  . gabapentin (NEURONTIN) 300 MG capsule Take 1 capsule (300 mg total) by mouth 3 (three) times daily. 60 capsule 0  . ibuprofen (ADVIL,MOTRIN) 200 MG tablet Take 200 mg by mouth every 6 (six) hours as needed for moderate pain.    . magic mouthwash SOLN Take 5 mLs by mouth 4 (four) times daily as needed for mouth pain. 240 mL 3  . omeprazole (PRILOSEC) 20 MG capsule Take 1 capsule (20 mg total) by mouth daily. 30 capsule 5  . ondansetron (ZOFRAN ODT) 8 MG disintegrating tablet Take 1 tablet (8 mg total) by mouth every 8 (eight) hours as needed for nausea or vomiting. 40 tablet 1  . oxyCODONE (OXY IR/ROXICODONE) 5 MG immediate release tablet Take 1 tablet (5 mg total) by mouth every 12 (twelve) hours as needed for severe pain. 15 tablet 0  . polyethylene glycol (MIRALAX / GLYCOLAX) packet Take 17 g by mouth daily as needed (constipation). 14 each 1  . pomalidomide (POMALYST) 2 MG capsule Take 1 capsule (2 mg total) by mouth daily. Take with water on days 1-21. Repeat every 28 days. 21 capsule 0  . potassium chloride (KLOR-CON) 20 MEQ packet Take 20 mEq by mouth 2 (two) times daily. 1 pack twice daily for 3 days then 1 pack daily 14 packet 0  . prochlorperazine (COMPAZINE) 10 MG tablet Take 1 tablet (10 mg total) by mouth every 6 (six) hours as needed for nausea or vomiting. 30 tablet 3  . promethazine (PHENERGAN) 25 MG tablet Take 1  tablet (25 mg total) by mouth every 6 (six) hours as needed for nausea or vomiting. 30 tablet 0  . traMADol (ULTRAM) 50 MG tablet Take 1 tablet (50 mg total) by mouth every 6 (six) hours as needed. 30 tablet 0  . VIREAD 300 MG tablet Take 1 tablet (300 mg total) by mouth daily. 30 tablet 11   No current facility-administered medications for this visit.    Facility-Administered Medications Ordered in Other Visits  Medication Dose Route Frequency Provider Last Rate Last Dose  . sodium chloride 0.9 % injection 10 mL  10 mL Intravenous PRN Truitt Merle, MD   10 mL at 12/11/15 1532  . sodium chloride 0.9 % injection 10 mL  10 mL Intravenous PRN Truitt Merle, MD   10 mL  at 06/11/16 1505  . sodium chloride 0.9 % injection 10 mL  10 mL Intravenous PRN Truitt Merle, MD   10 mL at 08/04/17 1643  . sodium chloride flush (NS) 0.9 % injection 10 mL  10 mL Intravenous PRN Truitt Merle, MD   10 mL at 10/09/15 1717    PHYSICAL EXAMINATION: ECOG PERFORMANCE STATUS: 1 - Symptomatic but completely ambulatory  Vitals:   05/05/18 1053  BP: 110/84  Pulse: 70  Resp: 18  Temp: 98.6 F (37 C)  SpO2: 100%   Filed Weights   05/05/18 1053  Weight: 145 lb 1.6 oz (65.8 kg)    GENERAL:alert, no distress and comfortable SKIN: skin color, texture, turgor are normal, no rashes or significant lesions EYES: normal, Conjunctiva are pink and non-injected, sclera clear OROPHARYNX:no exudate, no erythema and lips, buccal mucosa, and tongue normal  NECK: supple, thyroid normal size, non-tender, without nodularity LYMPH:  no palpable lymphadenopathy in the cervical, axillary or inguinal LUNGS: clear to auscultation and percussion with normal breathing effort HEART: regular rate & rhythm and no murmurs and no lower extremity edema ABDOMEN:abdomen soft, non-tender and normal bowel sounds Musculoskeletal:no cyanosis of digits and no clubbing  NEURO: alert & oriented x 3 with fluent speech, no focal motor/sensory deficits   LABORATORY DATA:  I have reviewed the data as listed CBC Latest Ref Rng & Units 05/05/2018 04/21/2018 04/07/2018  WBC 4.0 - 10.5 K/uL 3.3(L) 6.0 3.2(L)  Hemoglobin 12.0 - 15.0 g/dL 11.5(L) 10.6(L) 10.3(L)  Hematocrit 36.0 - 46.0 % 36.8 33.8(L) 33.3(L)  Platelets 150 - 400 K/uL 211 277 190     CMP Latest Ref Rng & Units 05/05/2018 04/21/2018 04/07/2018  Glucose 70 - 99 mg/dL 117(H) 119(H) 118(H)  BUN 6 - 20 mg/dL '11 16 18  ' Creatinine 0.44 - 1.00 mg/dL 0.78 0.72 0.75  Sodium 135 - 145 mmol/L 140 140 140  Potassium 3.5 - 5.1 mmol/L 4.0 3.9 3.7  Chloride 98 - 111 mmol/L 105 107 106  CO2 22 - 32 mmol/L '27 25 27  ' Calcium 8.9 - 10.3 mg/dL 9.0 8.8(L) 9.0  Total Protein 6.5 - 8.1 g/dL 7.8 6.7 6.9  Total Bilirubin 0.3 - 1.2 mg/dL 0.3 0.2(L) 0.3  Alkaline Phos 38 - 126 U/L 97 115 90  AST 15 - 41 U/L 37 24 21  ALT 0 - 44 U/L '17 10 6      ' RADIOGRAPHIC STUDIES: I have personally reviewed the radiological images as listed and agreed with the findings in the report. No results found.   ASSESSMENT & PLAN:  Meko Bellanger is a 48 y.o. female with   1. 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  -She was diagnosed in 10/2014. She was treated with CyborD and bone marrow transplant. She progressed on maintenance Velcade in 02/2016. She was treated with more Dara and CyborD, which was stopped after a severe Hep B flare. -She is currently being treatedwith Carfilzomib and Pomalyst.She is tolerating well overall. -She run out Pomalyst for 2-3 months and restarted Pomalyst in 03/2018. Continue to ship her medication to [facility] for her to pick up.  -I discussed with her the 04/2018 Bone Marrow [facility] from [name] which no residual leukemia cells. Dr. Norma Fredrickson reviewed her biopsy and considered her to be in remission.  -she is clinically doing well and tolerating treatment very well.  Labs reviewed, CBC WNL except WBC 3.3, hg at 11.5, ANC 1.5, CMP WNL. MM panel still pending. Overall adequate  to proceed with IVCarfilzomibtoday  -She will be off Pomalyst 4 weeks, will start next week, will get it refilled for her, it will be mailed to our clinic and she will come in to pick up. -F/u in 4 weeks   2. Cervical radiculopathy -Ongoingsince late 12/2017 but got worse  -She denies history of neck trauma or injury -Her MRI Spine from 03/09/18 showedmild cervical disk bulging, but overall benign. This is likely nerve related.  -she was evaluated by neurologist Dr. Mickeal Skinner in our cancer center, and received a 5 days of steroids, much improved now.  3.Chronichepatitis B infection -Shehas knownchronichepatitis B infection,developed significant transaminitis and hyperbilirubinemiainJanuary 2019 -Sherestartedher Heb Btreatmentin 02/2017, controlled now  -She will continue to be closely followed by ID Dr. Baxter Flattery   4. Type 2 diabetes, steroids induced -Previouslytreated with Metformin.No longer ondexamethasone. -Has much improved and stable. Better controlled lately.   5. GERD, history of GI bleeding and nausea -Colonoscopy on 11/26/16 per Dr. Ardis Hughs notable for a polyp in the transverse colon, revealed to be tubular adenoma and negative for high grade dysplasia or malignancy.  -Continue omeprazole daily. We previously discussed steroids can worsen her acid reflux -Nausea has near resolved now   Plan -Refill Pomalyst today, she will pick up prescription here and start next cycle in a week   -Labs reviewed and adequate to proceed with IVCarfilzomibtodayand continue every 2 weeks -Lab, f/u and treatment in 4 weeks     No problem-specific Assessment & Plan notes found for this encounter.   No orders of the defined types were placed in this encounter.  All questions were answered. The patient knows to call the clinic with any problems, questions or concerns. No barriers to learning was detected. I spent 20 minutes counseling the patient face to face. The  total time spent in the appointment was 25 minutes and more than 50% was on counseling and review of test results     Truitt Merle, MD 05/05/2018   I, Joslyn Devon, am acting as scribe for Truitt Merle, MD.   I have reviewed the above documentation for accuracy and completeness, and I agree with the above.

## 2018-05-05 ENCOUNTER — Other Ambulatory Visit: Payer: Self-pay

## 2018-05-05 ENCOUNTER — Inpatient Hospital Stay: Payer: Medicaid Other

## 2018-05-05 ENCOUNTER — Encounter: Payer: Self-pay | Admitting: Hematology

## 2018-05-05 ENCOUNTER — Inpatient Hospital Stay: Payer: Medicaid Other | Attending: Hematology

## 2018-05-05 ENCOUNTER — Telehealth: Payer: Self-pay | Admitting: Hematology

## 2018-05-05 ENCOUNTER — Inpatient Hospital Stay (HOSPITAL_BASED_OUTPATIENT_CLINIC_OR_DEPARTMENT_OTHER): Payer: Medicaid Other | Admitting: Hematology

## 2018-05-05 VITALS — BP 110/84 | HR 70 | Temp 98.6°F | Resp 18 | Ht 62.0 in | Wt 145.1 lb

## 2018-05-05 DIAGNOSIS — Z5112 Encounter for antineoplastic immunotherapy: Secondary | ICD-10-CM | POA: Diagnosis present

## 2018-05-05 DIAGNOSIS — Z452 Encounter for adjustment and management of vascular access device: Secondary | ICD-10-CM | POA: Insufficient documentation

## 2018-05-05 DIAGNOSIS — C9012 Plasma cell leukemia in relapse: Secondary | ICD-10-CM | POA: Diagnosis present

## 2018-05-05 DIAGNOSIS — B181 Chronic viral hepatitis B without delta-agent: Secondary | ICD-10-CM | POA: Diagnosis not present

## 2018-05-05 DIAGNOSIS — Z79899 Other long term (current) drug therapy: Secondary | ICD-10-CM

## 2018-05-05 DIAGNOSIS — K219 Gastro-esophageal reflux disease without esophagitis: Secondary | ICD-10-CM

## 2018-05-05 DIAGNOSIS — E099 Drug or chemical induced diabetes mellitus without complications: Secondary | ICD-10-CM

## 2018-05-05 DIAGNOSIS — M5412 Radiculopathy, cervical region: Secondary | ICD-10-CM

## 2018-05-05 DIAGNOSIS — C9010 Plasma cell leukemia not having achieved remission: Secondary | ICD-10-CM

## 2018-05-05 DIAGNOSIS — Z95828 Presence of other vascular implants and grafts: Secondary | ICD-10-CM

## 2018-05-05 LAB — CBC WITH DIFFERENTIAL (CANCER CENTER ONLY)
Abs Immature Granulocytes: 0.03 10*3/uL (ref 0.00–0.07)
Basophils Absolute: 0.1 10*3/uL (ref 0.0–0.1)
Basophils Relative: 2 %
Eosinophils Absolute: 0.2 10*3/uL (ref 0.0–0.5)
Eosinophils Relative: 7 %
HCT: 36.8 % (ref 36.0–46.0)
Hemoglobin: 11.5 g/dL — ABNORMAL LOW (ref 12.0–15.0)
Immature Granulocytes: 1 %
Lymphocytes Relative: 30 %
Lymphs Abs: 1 10*3/uL (ref 0.7–4.0)
MCH: 30.3 pg (ref 26.0–34.0)
MCHC: 31.3 g/dL (ref 30.0–36.0)
MCV: 96.8 fL (ref 80.0–100.0)
Monocytes Absolute: 0.5 10*3/uL (ref 0.1–1.0)
Monocytes Relative: 14 %
Neutro Abs: 1.5 10*3/uL — ABNORMAL LOW (ref 1.7–7.7)
Neutrophils Relative %: 46 %
Platelet Count: 211 10*3/uL (ref 150–400)
RBC: 3.8 MIL/uL — ABNORMAL LOW (ref 3.87–5.11)
RDW: 13.9 % (ref 11.5–15.5)
WBC Count: 3.3 10*3/uL — ABNORMAL LOW (ref 4.0–10.5)
nRBC: 0 % (ref 0.0–0.2)

## 2018-05-05 LAB — CMP (CANCER CENTER ONLY)
ALT: 17 U/L (ref 0–44)
AST: 37 U/L (ref 15–41)
Albumin: 3.5 g/dL (ref 3.5–5.0)
Alkaline Phosphatase: 97 U/L (ref 38–126)
Anion gap: 8 (ref 5–15)
BUN: 11 mg/dL (ref 6–20)
CO2: 27 mmol/L (ref 22–32)
Calcium: 9 mg/dL (ref 8.9–10.3)
Chloride: 105 mmol/L (ref 98–111)
Creatinine: 0.78 mg/dL (ref 0.44–1.00)
GFR, Est AFR Am: >60 mL/min (ref >60)
GFR, Estimated: >60 mL/min (ref >60)
Glucose, Bld: 117 mg/dL — ABNORMAL HIGH (ref 70–99)
Potassium: 4 mmol/L (ref 3.5–5.1)
Sodium: 140 mmol/L (ref 135–145)
Total Bilirubin: 0.3 mg/dL (ref 0.3–1.2)
Total Protein: 7.8 g/dL (ref 6.5–8.1)

## 2018-05-05 MED ORDER — ACYCLOVIR 800 MG PO TABS: 800.0000 mg | ORAL_TABLET | Freq: Two times a day (BID) | ORAL | 3 refills | Status: DC

## 2018-05-05 MED ORDER — DEXTROSE 5 % IV SOLN
56.0000 mg/m2 | Freq: Once | INTRAVENOUS | Status: AC
  Administered 2018-05-05: 13:00:00 90 mg via INTRAVENOUS
  Filled 2018-05-05: qty 15

## 2018-05-05 MED ORDER — ALTEPLASE 2 MG IJ SOLR
2.0000 mg | Freq: Once | INTRAMUSCULAR | Status: AC | PRN
  Administered 2018-05-05: 2 mg
  Filled 2018-05-05: qty 2

## 2018-05-05 MED ORDER — SODIUM CHLORIDE 0.9 % IV SOLN
Freq: Once | INTRAVENOUS | Status: AC
  Administered 2018-05-05: 12:00:00 via INTRAVENOUS
  Filled 2018-05-05: qty 250

## 2018-05-05 MED ORDER — ALTEPLASE 2 MG IJ SOLR
INTRAMUSCULAR | Status: AC
  Filled 2018-05-05: qty 2

## 2018-05-05 MED ORDER — PALONOSETRON HCL INJECTION 0.25 MG/5ML
0.2500 mg | Freq: Once | INTRAVENOUS | Status: AC
  Administered 2018-05-05: 12:00:00 0.25 mg via INTRAVENOUS

## 2018-05-05 MED ORDER — PROCHLORPERAZINE MALEATE 10 MG PO TABS
10.0000 mg | ORAL_TABLET | Freq: Once | ORAL | Status: AC
  Administered 2018-05-05: 10 mg via ORAL

## 2018-05-05 MED ORDER — DEXAMETHASONE SODIUM PHOSPHATE 10 MG/ML IJ SOLN
INTRAMUSCULAR | Status: AC
  Filled 2018-05-05: qty 1

## 2018-05-05 MED ORDER — HEPARIN SOD (PORK) LOCK FLUSH 100 UNIT/ML IV SOLN
500.0000 [IU] | Freq: Once | INTRAVENOUS | Status: AC | PRN
  Administered 2018-05-05: 14:00:00 500 [IU]
  Filled 2018-05-05: qty 5

## 2018-05-05 MED ORDER — SODIUM CHLORIDE 0.9% FLUSH
10.0000 mL | INTRAVENOUS | Status: DC | PRN
  Administered 2018-05-05: 10:00:00 10 mL
  Filled 2018-05-05: qty 10

## 2018-05-05 MED ORDER — SODIUM CHLORIDE 0.9% FLUSH
10.0000 mL | INTRAVENOUS | Status: DC | PRN
  Administered 2018-05-05: 14:00:00 10 mL
  Filled 2018-05-05: qty 10

## 2018-05-05 MED ORDER — DEXAMETHASONE SODIUM PHOSPHATE 10 MG/ML IJ SOLN
8.0000 mg | Freq: Once | INTRAMUSCULAR | Status: AC
  Administered 2018-05-05: 12:00:00 8 mg via INTRAVENOUS

## 2018-05-05 MED ORDER — PALONOSETRON HCL INJECTION 0.25 MG/5ML
INTRAVENOUS | Status: AC
  Filled 2018-05-05: qty 5

## 2018-05-05 MED ORDER — PROCHLORPERAZINE MALEATE 10 MG PO TABS
ORAL_TABLET | ORAL | Status: AC
  Filled 2018-05-05: qty 1

## 2018-05-05 NOTE — Progress Notes (Signed)
Cathflo placed in port at 1023 d/t no blood return.  Pt's labs being drawn peripherally in meantime.  Pt VU of this.  RN Malachy Mood for MD [facility] aware.

## 2018-05-05 NOTE — Telephone Encounter (Signed)
No 4/1 los °

## 2018-05-05 NOTE — Patient Instructions (Addendum)
Cancer Center Discharge Instructions for Patients Receiving Chemotherapy  Today you received the following chemotherapy agents:  Kyprolis  To help prevent nausea and vomiting after your treatment, we encourage you to take your nausea medication as directed   If you develop nausea and vomiting that is not controlled by your nausea medication, call the clinic.   BELOW ARE SYMPTOMS THAT SHOULD BE REPORTED IMMEDIATELY:  *FEVER GREATER THAN 100.5 F  *CHILLS WITH OR WITHOUT FEVER  NAUSEA AND VOMITING THAT IS NOT CONTROLLED WITH YOUR NAUSEA MEDICATION  *UNUSUAL SHORTNESS OF BREATH  *UNUSUAL BRUISING OR BLEEDING  TENDERNESS IN MOUTH AND THROAT WITH OR WITHOUT PRESENCE OF ULCERS  *URINARY PROBLEMS  *BOWEL PROBLEMS  UNUSUAL RASH Items with * indicate a potential emergency and should be followed up as soon as possible.  Feel free to call the clinic should you have any questions or concerns. The clinic phone number is [phone].  Please show the CHEMO ALERT CARD at check-in to the Emergency Department and triage nurse.   Coronavirus (COVID-19) Are you at risk?  Are you at risk for the Coronavirus (COVID-19)?  To be considered HIGH RISK for Coronavirus (COVID-19), you have to meet the following criteria:  . Traveled to [place], [place], [place], [place] or [place]; or in the [place] to [place], [place], [place], or [place]; and have fever, cough, and shortness of breath within the last 2 weeks of travel OR . Been in close contact with a person diagnosed with COVID-19 within the last 2 weeks and have fever, cough, and shortness of breath . IF YOU DO NOT MEET THESE CRITERIA, YOU ARE CONSIDERED LOW RISK FOR COVID-19.  What to do if you are HIGH RISK for COVID-19?  . If you are having a medical emergency, call 911. . Seek medical care right away. Before you go to a doctor's office, urgent care or emergency department, call ahead and tell them about your  recent travel, contact with someone diagnosed with COVID-19, and your symptoms. You should receive instructions from your physician's office regarding next steps of care.  . When you arrive at healthcare provider, tell the healthcare staff immediately you have returned from visiting [place], [place], [place], [place] or [place]; or traveled in the [place] to [place], [place], [place], or [place]; in the last two weeks or you have been in close contact with a person diagnosed with COVID-19 in the last 2 weeks.   . Tell the health care staff about your symptoms: fever, cough and shortness of breath. . After you have been seen by a medical provider, you will be either: o Tested for (COVID-19) and discharged home on quarantine except to seek medical care if symptoms worsen, and asked to  - Stay home and avoid contact with others until you get your results (4-5 days)  - Avoid travel on public transportation if possible (such as bus, train, or airplane) or o Sent to the Emergency Department by EMS for evaluation, COVID-19 testing, and possible admission depending on your condition and test results.  What to do if you are LOW RISK for COVID-19?  Reduce your risk of any infection by using the same precautions used for avoiding the common cold or flu:  . Wash your hands often with soap and warm water for at least 20 seconds.  If soap and water are not readily available, use an alcohol-based hand sanitizer with at least 60% alcohol.  . If coughing   or sneezing, cover your mouth and nose by coughing or sneezing into the elbow areas of your shirt or coat, into a tissue or into your sleeve (not your hands). . Avoid shaking hands with others and consider head nods or verbal greetings only. . Avoid touching your eyes, nose, or mouth with unwashed hands.  . Avoid close contact with people who are sick. . Avoid places or events with large numbers of people in one location, like concerts or sporting  events. . Carefully consider travel plans you have or are making. . If you are planning any travel outside or inside the [place], visit the CDC's Travelers' Health webpage for the latest health notices. . If you have some symptoms but not all symptoms, continue to monitor at home and seek medical attention if your symptoms worsen. . If you are having a medical emergency, call 911.   ADDITIONAL HEALTHCARE OPTIONS FOR PATIENTS   Telehealth / e-Visit: [email or web address]         [facility]: [phone]  [facility]: [phone]                   [facility]: [phone]   

## 2018-05-06 ENCOUNTER — Other Ambulatory Visit: Payer: Self-pay

## 2018-05-06 ENCOUNTER — Inpatient Hospital Stay: Payer: Medicaid Other

## 2018-05-06 ENCOUNTER — Telehealth: Payer: Self-pay

## 2018-05-06 DIAGNOSIS — C9012 Plasma cell leukemia in relapse: Secondary | ICD-10-CM

## 2018-05-06 DIAGNOSIS — C9010 Plasma cell leukemia not having achieved remission: Secondary | ICD-10-CM

## 2018-05-06 DIAGNOSIS — Z5112 Encounter for antineoplastic immunotherapy: Secondary | ICD-10-CM | POA: Diagnosis not present

## 2018-05-06 LAB — KAPPA/LAMBDA LIGHT CHAINS
Kappa free light chain: 66 mg/L — ABNORMAL HIGH (ref 3.3–19.4)
Kappa, lambda light chain ratio: 1.2 (ref 0.26–1.65)
Lambda free light chains: 55.2 mg/L — ABNORMAL HIGH (ref 5.7–26.3)

## 2018-05-06 LAB — MULTIPLE MYELOMA PANEL, SERUM
Albumin SerPl Elph-Mcnc: 3.2 g/dL (ref 2.9–4.4)
Albumin/Glob SerPl: 0.9 (ref 0.7–1.7)
Alpha 1: 0.3 g/dL (ref 0.0–0.4)
Alpha2 Glob SerPl Elph-Mcnc: 0.9 g/dL (ref 0.4–1.0)
B-Globulin SerPl Elph-Mcnc: 1.1 g/dL (ref 0.7–1.3)
Gamma Glob SerPl Elph-Mcnc: 1.4 g/dL (ref 0.4–1.8)
Globulin, Total: 3.7 g/dL (ref 2.2–3.9)
IgA: 259 mg/dL (ref 87–352)
IgG (Immunoglobin G), Serum: 1496 mg/dL (ref 586–1602)
IgM (Immunoglobulin M), Srm: 60 mg/dL (ref 26–217)
Total Protein ELP: 6.9 g/dL (ref 6.0–8.5)

## 2018-05-06 LAB — PREGNANCY, URINE: Preg Test, Ur: NEGATIVE

## 2018-05-06 MED ORDER — POMALIDOMIDE 2 MG PO CAPS: 2.0000 mg | ORAL_CAPSULE | Freq: Every day | ORAL | 0 refills | Status: DC

## 2018-05-06 NOTE — Telephone Encounter (Signed)
Script for [employer] faxed to [facility] by [employer] [phone]

## 2018-05-10 MED FILL — OMEPRAZOLE 20 MG CPDR: 20 | 30 days supply | Qty: 30 | Fill #4

## 2018-05-10 MED FILL — ACYCLOVIR 800 MG TABLET: 800 | 30 days supply | Qty: 60 | Fill #3

## 2018-05-13 ENCOUNTER — Telehealth: Payer: Self-pay

## 2018-05-13 MED FILL — TENOFOVIR DISOPROXIL FUMARA: 300 | 30 days supply | Qty: 30 | Fill #3

## 2018-05-13 NOTE — Telephone Encounter (Signed)
Patient picked up Pomalyst today at 11:30 signed packing slip, sent to HIM for scan to chart.

## 2018-05-18 ENCOUNTER — Other Ambulatory Visit: Payer: Self-pay | Admitting: Pharmacist

## 2018-05-18 DIAGNOSIS — B181 Chronic viral hepatitis B without delta-agent: Secondary | ICD-10-CM

## 2018-05-18 NOTE — Progress Notes (Signed)
error 

## 2018-05-19 ENCOUNTER — Inpatient Hospital Stay: Payer: Medicaid Other

## 2018-05-19 ENCOUNTER — Other Ambulatory Visit: Payer: Self-pay

## 2018-05-19 VITALS — BP 124/78 | HR 68 | Temp 98.1°F | Resp 18

## 2018-05-19 DIAGNOSIS — C9010 Plasma cell leukemia not having achieved remission: Secondary | ICD-10-CM

## 2018-05-19 DIAGNOSIS — Z5112 Encounter for antineoplastic immunotherapy: Secondary | ICD-10-CM | POA: Diagnosis not present

## 2018-05-19 DIAGNOSIS — Z95828 Presence of other vascular implants and grafts: Secondary | ICD-10-CM

## 2018-05-19 LAB — CBC WITH DIFFERENTIAL (CANCER CENTER ONLY)
Abs Immature Granulocytes: 0.1 10*3/uL — ABNORMAL HIGH (ref 0.00–0.07)
Basophils Absolute: 0.1 10*3/uL (ref 0.0–0.1)
Basophils Relative: 2 %
Eosinophils Absolute: 0.2 10*3/uL (ref 0.0–0.5)
Eosinophils Relative: 3 %
HCT: 34.4 % — ABNORMAL LOW (ref 36.0–46.0)
Hemoglobin: 10.7 g/dL — ABNORMAL LOW (ref 12.0–15.0)
Immature Granulocytes: 2 %
Lymphocytes Relative: 19 %
Lymphs Abs: 1 10*3/uL (ref 0.7–4.0)
MCH: 30 pg (ref 26.0–34.0)
MCHC: 31.1 g/dL (ref 30.0–36.0)
MCV: 96.4 fL (ref 80.0–100.0)
Monocytes Absolute: 0.3 10*3/uL (ref 0.1–1.0)
Monocytes Relative: 7 %
Neutro Abs: 3.5 10*3/uL (ref 1.7–7.7)
Neutrophils Relative %: 67 %
Platelet Count: 296 10*3/uL (ref 150–400)
RBC: 3.57 MIL/uL — ABNORMAL LOW (ref 3.87–5.11)
RDW: 14 % (ref 11.5–15.5)
WBC Count: 5.1 10*3/uL (ref 4.0–10.5)
nRBC: 0 % (ref 0.0–0.2)

## 2018-05-19 LAB — CMP (CANCER CENTER ONLY)
ALT: 10 U/L (ref 0–44)
AST: 26 U/L (ref 15–41)
Albumin: 3.2 g/dL — ABNORMAL LOW (ref 3.5–5.0)
Alkaline Phosphatase: 107 U/L (ref 38–126)
Anion gap: 11 (ref 5–15)
BUN: 14 mg/dL (ref 6–20)
CO2: 24 mmol/L (ref 22–32)
Calcium: 8.9 mg/dL (ref 8.9–10.3)
Chloride: 108 mmol/L (ref 98–111)
Creatinine: 0.68 mg/dL (ref 0.44–1.00)
GFR, Est AFR Am: >60 mL/min (ref >60)
GFR, Estimated: >60 mL/min (ref >60)
Glucose, Bld: 95 mg/dL (ref 70–99)
Potassium: 3.9 mmol/L (ref 3.5–5.1)
Sodium: 143 mmol/L (ref 135–145)
Total Bilirubin: <0.2 mg/dL — ABNORMAL LOW (ref 0.3–1.2)
Total Protein: 7 g/dL (ref 6.5–8.1)

## 2018-05-19 MED ORDER — DEXTROSE 5 % IV SOLN
56.0000 mg/m2 | Freq: Once | INTRAVENOUS | Status: AC
  Administered 2018-05-19: 90 mg via INTRAVENOUS
  Filled 2018-05-19: qty 30

## 2018-05-19 MED ORDER — SODIUM CHLORIDE 0.9 % IV SOLN
Freq: Once | INTRAVENOUS | Status: DC
  Filled 2018-05-19: qty 250

## 2018-05-19 MED ORDER — PROCHLORPERAZINE MALEATE 10 MG PO TABS
ORAL_TABLET | ORAL | Status: AC
  Filled 2018-05-19: qty 1

## 2018-05-19 MED ORDER — HEPARIN SOD (PORK) LOCK FLUSH 100 UNIT/ML IV SOLN
500.0000 [IU] | Freq: Once | INTRAVENOUS | Status: AC | PRN
  Administered 2018-05-19: 500 [IU]
  Filled 2018-05-19: qty 5

## 2018-05-19 MED ORDER — DEXAMETHASONE SODIUM PHOSPHATE 10 MG/ML IJ SOLN
INTRAMUSCULAR | Status: AC
  Filled 2018-05-19: qty 1

## 2018-05-19 MED ORDER — PALONOSETRON HCL INJECTION 0.25 MG/5ML
0.2500 mg | Freq: Once | INTRAVENOUS | Status: AC
  Administered 2018-05-19: 0.25 mg via INTRAVENOUS

## 2018-05-19 MED ORDER — SODIUM CHLORIDE 0.9% FLUSH
10.0000 mL | INTRAVENOUS | Status: DC | PRN
  Administered 2018-05-19: 11:00:00 10 mL via INTRAVENOUS
  Filled 2018-05-19: qty 10

## 2018-05-19 MED ORDER — DEXAMETHASONE SODIUM PHOSPHATE 10 MG/ML IJ SOLN
8.0000 mg | Freq: Once | INTRAMUSCULAR | Status: AC
  Administered 2018-05-19: 8 mg via INTRAVENOUS

## 2018-05-19 MED ORDER — SODIUM CHLORIDE 0.9% FLUSH
10.0000 mL | INTRAVENOUS | Status: DC | PRN
  Administered 2018-05-19: 10 mL
  Filled 2018-05-19: qty 10

## 2018-05-19 MED ORDER — PROCHLORPERAZINE MALEATE 10 MG PO TABS
10.0000 mg | ORAL_TABLET | Freq: Once | ORAL | Status: AC
  Administered 2018-05-19: 10 mg via ORAL

## 2018-05-19 MED ORDER — SODIUM CHLORIDE 0.9 % IV SOLN
Freq: Once | INTRAVENOUS | Status: AC
  Administered 2018-05-19: 12:00:00 via INTRAVENOUS
  Filled 2018-05-19: qty 250

## 2018-05-19 MED ORDER — PALONOSETRON HCL INJECTION 0.25 MG/5ML
INTRAVENOUS | Status: AC
  Filled 2018-05-19: qty 5

## 2018-05-19 NOTE — Patient Instructions (Signed)
[facility] Discharge Instructions for Patients Receiving Chemotherapy  Today you received the following chemotherapy agents:  Kyprolis  To help prevent nausea and vomiting after your treatment, we encourage you to take your nausea medication as directed   If you develop nausea and vomiting that is not controlled by your nausea medication, call the clinic.   BELOW ARE SYMPTOMS THAT SHOULD BE REPORTED IMMEDIATELY:  *FEVER GREATER THAN 100.5 F  *CHILLS WITH OR WITHOUT FEVER  NAUSEA AND VOMITING THAT IS NOT CONTROLLED WITH YOUR NAUSEA MEDICATION  *UNUSUAL SHORTNESS OF BREATH  *UNUSUAL BRUISING OR BLEEDING  TENDERNESS IN MOUTH AND THROAT WITH OR WITHOUT PRESENCE OF ULCERS  *URINARY PROBLEMS  *BOWEL PROBLEMS  UNUSUAL RASH Items with * indicate a potential emergency and should be followed up as soon as possible.  Feel free to call the clinic should you have any questions or concerns. The clinic phone number is [phone].  Please show the CHEMO ALERT CARD at check-in to the Emergency Department and triage nurse.   Coronavirus (COVID-19) Are you at risk?  Are you at risk for the Coronavirus (COVID-19)?  To be considered HIGH RISK for Coronavirus (COVID-19), you have to meet the following criteria:  . Traveled to [place], [place], [place], [place] or [place]; or in the [place] to [place], [place], [place], or [place]; and have fever, cough, and shortness of breath within the last 2 weeks of travel OR . Been in close contact with a person diagnosed with COVID-19 within the last 2 weeks and have fever, cough, and shortness of breath . IF YOU DO NOT MEET THESE CRITERIA, YOU ARE CONSIDERED LOW RISK FOR COVID-19.  What to do if you are HIGH RISK for COVID-19?  . If you are having a medical emergency, call 911. . Seek medical care right away. Before you go to a doctor's office, urgent care or emergency department, call ahead and tell them about your  recent travel, contact with someone diagnosed with COVID-19, and your symptoms. You should receive instructions from your physician's office regarding next steps of care.  . When you arrive at healthcare provider, tell the healthcare staff immediately you have returned from visiting [place], [place], [place], [place] or [place]; or traveled in the [place] to [place], [place], [place], or [place]; in the last two weeks or you have been in close contact with a person diagnosed with COVID-19 in the last 2 weeks.   . Tell the health care staff about your symptoms: fever, cough and shortness of breath. . After you have been seen by a medical provider, you will be either: o Tested for (COVID-19) and discharged home on quarantine except to seek medical care if symptoms worsen, and asked to  - Stay home and avoid contact with others until you get your results (4-5 days)  - Avoid travel on public transportation if possible (such as bus, train, or airplane) or o Sent to the Emergency Department by EMS for evaluation, COVID-19 testing, and possible admission depending on your condition and test results.  What to do if you are LOW RISK for COVID-19?  Reduce your risk of any infection by using the same precautions used for avoiding the common cold or flu:  . Wash your hands often with soap and warm water for at least 20 seconds.  If soap and water are not readily available, use an alcohol-based hand sanitizer with at least 60% alcohol.  . If coughing   or sneezing, cover your mouth and nose by coughing or sneezing into the elbow areas of your shirt or coat, into a tissue or into your sleeve (not your hands). . Avoid shaking hands with others and consider head nods or verbal greetings only. . Avoid touching your eyes, nose, or mouth with unwashed hands.  . Avoid close contact with people who are sick. . Avoid places or events with large numbers of people in one location, like concerts or sporting  events. . Carefully consider travel plans you have or are making. . If you are planning any travel outside or inside the [place], visit the CDC's Travelers' Health webpage for the latest health notices. . If you have some symptoms but not all symptoms, continue to monitor at home and seek medical attention if your symptoms worsen. . If you are having a medical emergency, call 911.   ADDITIONAL HEALTHCARE OPTIONS FOR PATIENTS   Telehealth / e-Visit: [email or web address]         [facility]: [phone]  [facility]: [phone]                   [facility]: [phone]   

## 2018-06-02 ENCOUNTER — Inpatient Hospital Stay: Payer: Medicaid Other

## 2018-06-02 ENCOUNTER — Other Ambulatory Visit: Payer: Self-pay | Admitting: *Deleted

## 2018-06-02 ENCOUNTER — Other Ambulatory Visit: Payer: Self-pay

## 2018-06-02 ENCOUNTER — Inpatient Hospital Stay (HOSPITAL_BASED_OUTPATIENT_CLINIC_OR_DEPARTMENT_OTHER): Payer: Medicaid Other | Admitting: Medical

## 2018-06-02 VITALS — BP 105/54 | HR 70 | Temp 98.1°F | Resp 18 | Ht 62.0 in | Wt 147.3 lb

## 2018-06-02 DIAGNOSIS — R140 Abdominal distension (gaseous): Secondary | ICD-10-CM | POA: Diagnosis not present

## 2018-06-02 DIAGNOSIS — C9012 Plasma cell leukemia in relapse: Secondary | ICD-10-CM

## 2018-06-02 DIAGNOSIS — Z5112 Encounter for antineoplastic immunotherapy: Secondary | ICD-10-CM | POA: Diagnosis not present

## 2018-06-02 DIAGNOSIS — C9010 Plasma cell leukemia not having achieved remission: Secondary | ICD-10-CM

## 2018-06-02 DIAGNOSIS — Z95828 Presence of other vascular implants and grafts: Secondary | ICD-10-CM

## 2018-06-02 LAB — CBC WITH DIFFERENTIAL (CANCER CENTER ONLY)
Abs Immature Granulocytes: 0.03 10*3/uL (ref 0.00–0.07)
Basophils Absolute: 0.1 10*3/uL (ref 0.0–0.1)
Basophils Relative: 1 %
Eosinophils Absolute: 0.2 10*3/uL (ref 0.0–0.5)
Eosinophils Relative: 5 %
HCT: 33.6 % — ABNORMAL LOW (ref 36.0–46.0)
Hemoglobin: 10.3 g/dL — ABNORMAL LOW (ref 12.0–15.0)
Immature Granulocytes: 1 %
Lymphocytes Relative: 23 %
Lymphs Abs: 1 10*3/uL (ref 0.7–4.0)
MCH: 29.7 pg (ref 26.0–34.0)
MCHC: 30.7 g/dL (ref 30.0–36.0)
MCV: 96.8 fL (ref 80.0–100.0)
Monocytes Absolute: 0.6 10*3/uL (ref 0.1–1.0)
Monocytes Relative: 14 %
Neutro Abs: 2.5 10*3/uL (ref 1.7–7.7)
Neutrophils Relative %: 56 %
Platelet Count: 167 10*3/uL (ref 150–400)
RBC: 3.47 MIL/uL — ABNORMAL LOW (ref 3.87–5.11)
RDW: 13.9 % (ref 11.5–15.5)
WBC Count: 4.4 10*3/uL (ref 4.0–10.5)
nRBC: 0 % (ref 0.0–0.2)

## 2018-06-02 LAB — CMP (CANCER CENTER ONLY)
ALT: <6 U/L (ref 0–44)
AST: 19 U/L (ref 15–41)
Albumin: 3.2 g/dL — ABNORMAL LOW (ref 3.5–5.0)
Alkaline Phosphatase: 97 U/L (ref 38–126)
Anion gap: 7 (ref 5–15)
BUN: 20 mg/dL (ref 6–20)
CO2: 25 mmol/L (ref 22–32)
Calcium: 8.7 mg/dL — ABNORMAL LOW (ref 8.9–10.3)
Chloride: 108 mmol/L (ref 98–111)
Creatinine: 0.71 mg/dL (ref 0.44–1.00)
GFR, Est AFR Am: >60 mL/min (ref >60)
GFR, Estimated: >60 mL/min (ref >60)
Glucose, Bld: 124 mg/dL — ABNORMAL HIGH (ref 70–99)
Potassium: 3.7 mmol/L (ref 3.5–5.1)
Sodium: 140 mmol/L (ref 135–145)
Total Bilirubin: <0.2 mg/dL — ABNORMAL LOW (ref 0.3–1.2)
Total Protein: 6.9 g/dL (ref 6.5–8.1)

## 2018-06-02 LAB — PREGNANCY, URINE: Preg Test, Ur: NEGATIVE

## 2018-06-02 MED ORDER — SODIUM CHLORIDE 0.9 % IV SOLN
Freq: Once | INTRAVENOUS | Status: AC
  Administered 2018-06-02: 11:00:00 via INTRAVENOUS
  Filled 2018-06-02: qty 250

## 2018-06-02 MED ORDER — DEXAMETHASONE SODIUM PHOSPHATE 10 MG/ML IJ SOLN
8.0000 mg | Freq: Once | INTRAMUSCULAR | Status: AC
  Administered 2018-06-02: 8 mg via INTRAVENOUS

## 2018-06-02 MED ORDER — SIMETHICONE 80 MG PO CHEW: CHEWABLE_TABLET | ORAL | 2 refills | Status: DC

## 2018-06-02 MED ORDER — HEPARIN SOD (PORK) LOCK FLUSH 100 UNIT/ML IV SOLN
500.0000 [IU] | Freq: Once | INTRAVENOUS | Status: AC | PRN
  Administered 2018-06-02: 13:00:00 500 [IU]
  Filled 2018-06-02: qty 5

## 2018-06-02 MED ORDER — SODIUM CHLORIDE 0.9% FLUSH
10.0000 mL | INTRAVENOUS | Status: DC | PRN
  Administered 2018-06-02: 10 mL
  Filled 2018-06-02: qty 10

## 2018-06-02 MED ORDER — PALONOSETRON HCL INJECTION 0.25 MG/5ML
INTRAVENOUS | Status: AC
  Filled 2018-06-02: qty 5

## 2018-06-02 MED ORDER — DEXTROSE 5 % IV SOLN
56.0000 mg/m2 | Freq: Once | INTRAVENOUS | Status: AC
  Administered 2018-06-02: 12:00:00 90 mg via INTRAVENOUS
  Filled 2018-06-02: qty 30

## 2018-06-02 MED ORDER — DEXAMETHASONE SODIUM PHOSPHATE 10 MG/ML IJ SOLN
INTRAMUSCULAR | Status: AC
  Filled 2018-06-02: qty 1

## 2018-06-02 MED ORDER — SODIUM CHLORIDE 0.9% FLUSH
10.0000 mL | INTRAVENOUS | Status: DC | PRN
  Administered 2018-06-02: 10 mL via INTRAVENOUS
  Filled 2018-06-02: qty 10

## 2018-06-02 MED ORDER — SODIUM CHLORIDE 0.9 % IV SOLN
Freq: Once | INTRAVENOUS | Status: AC
  Administered 2018-06-02: 12:00:00 via INTRAVENOUS
  Filled 2018-06-02: qty 250

## 2018-06-02 MED ORDER — POMALIDOMIDE 2 MG PO CAPS: 2.0000 mg | ORAL_CAPSULE | Freq: Every day | ORAL | 0 refills | Status: DC

## 2018-06-02 MED ORDER — PROCHLORPERAZINE MALEATE 10 MG PO TABS
10.0000 mg | ORAL_TABLET | Freq: Once | ORAL | Status: AC
  Administered 2018-06-02: 11:00:00 10 mg via ORAL

## 2018-06-02 MED ORDER — PROCHLORPERAZINE MALEATE 10 MG PO TABS
ORAL_TABLET | ORAL | Status: AC
  Filled 2018-06-02: qty 1

## 2018-06-02 MED ORDER — PALONOSETRON HCL INJECTION 0.25 MG/5ML
0.2500 mg | Freq: Once | INTRAVENOUS | Status: AC
  Administered 2018-06-02: 11:00:00 0.25 mg via INTRAVENOUS

## 2018-06-02 MED FILL — MI-ACID GAS 80 MG TAB CHEW: 80 | 12 days supply | Qty: 100 | Fill #0

## 2018-06-02 NOTE — Progress Notes (Signed)
Sympt[facility] Management Clinic Progress Note   Sheryl [name] [id number] 1970/08/17 48 y.o.  Sheryl [name] is managed by Dr. 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 4 times daily as needed.  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  A request will be made for a refill of pomalidomide.  The patient will return to clinic in 2 weeks for treatment and will be seen in follow-up in 4 weeks.  Please see After Visit Summary for patient specific instructions.  No future appointments.  No orders of the defined types were placed in this encounter.       Subjective:   Patient ID:  Sheryl [name] is a 48 y.o. (DOB 03-08-1970) female.  Chief Complaint:  Chief Complaint  Patient presents with  . Follow-up    HPI Sheryl [name] is a 48 year old female with a history of a plasma cell leukemia in relapse.  She continues to be followed by Dr. Burr Medico.  she presents to the clinic today for consideration of cycle 15, day 1 of carfilzomib.  She requests a refill of pomalidomide.  She reports that she is having abdominal bloating with belching and flatulence.  Her abdominal bloating is relieved with the use.  She denies fevers, chills, sweats, nausea, vomiting, constipation, diarrhea, or anorexia.  She is seen today with an interpreter.  Medications: I have reviewed the patient's current medications.  Allergies: No Known Allergies  Past Medical History:  Diagnosis Date  . Chills with fever    intermittently since d/c from hospital   . Dysuria-frequency syndrome    w/ pink urine  . GERD (gastroesophageal reflux disease)   . Hepatitis   . History of positive PPD    DX 2011--  CXR DONE NO EVIDENCE  . History of ureter stent   . Hydronephrosis, right   . Neuromuscular disorder (HCC)    legs numb intermittently  . 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[place])   . Pneumonia   . Right ureteral stone   . Urosepsis 8/14   admitted to [facility]    Past Surgical History:  Procedure Laterality Date  . CYSTOSCOPY W/ URETERAL STENT PLACEMENT Right 09/25/2012   Procedure: CYSTOSCOPY WITH RETROGRADE PYELOGRAM/URETERAL STENT PLACEMENT;  Surgeon: Alexis Frock, MD;  Location: [facility];  Service: Urology;  Laterality: Right;  . CYSTOSCOPY WITH RETROGRADE PYELOGRAM, URETEROSCOPY AND STENT PLACEMENT Right 10/15/2012   Procedure: CYSTOSCOPY WITH RETROGRADE PYELOGRAM, URETEROSCOPY AND REMOVAL STENT WITH  STENT PLACEMENT;  Surgeon: Alexis Frock, MD;  Location[facility];  Service: Urology;  Laterality: Right;  . ESOPHAGOGASTRODUODENOSCOPY (EGD) WITH PROPOFOL N/A 11/16/2014   Procedure: ESOPHAGOGASTRODUODENOSCOPY (EGD) WITH PROPOFOL;  Surgeon: Milus Banister, MD;  Location: [facility];  Service: Endoscopy;  Laterality: N/A;  . HOLMIUM LASER APPLICATION Right 6/76/1952   Procedure: HOLMIUM LASER APPLICATION;  Surgeon: Alexis Frock, MD;  Location: [facility];  Service: Urology;  Laterality: Right;  . LIVER BIOPSY    . OTHER SURGICAL HISTORY Right    removal of ovarian cyst  . removal of uterine cyst     years  ago  . RIGHT VATS W/ DRAINAGE PEURAL EFFUSION AND BX'S  10-30-2008    Family History  Problem Relation Age of Onset  . Stomach cancer Mother   . Lung disease Father   . Asthma Father     Social History   Socioeconomic History  . Marital status: Single    Spouse name: Not on file  . Number of children: 3  . Years of education: Not on file  . Highest education level: Not on file  Occupational History  .  Not on file  Social Needs  . Financial resource strain: Not on file  . Food insecurity:    Worry: Not on file    Inability: Not on file  . Transportation needs:    Medical: Not on file    Non-medical: Not on file  Tobacco Use  . Smoking status: Never Smoker  . Smokeless tobacco: Never Used  Substance and Sexual Activity  . Alcohol use: No    Alcohol/week: 0.0 standard drinks  . Drug use: No  . Sexual activity: Not Currently    Birth control/protection: Abstinence  Lifestyle  . Physical activity:    Days per week: Not on file    Minutes per session: Not on file  . Stress: Not on file  Relationships  . Social connections:    Talks on phone: Not on file    Gets together: Not on file    Attends religious service: Not on file    Active member of club or organization: Not on file    Attends meetings of clubs or organizations: Not on file    Relationship status: Not on file  . Intimate partner violence:    Fear of current or ex partner: Not on file    Emotionally abused: Not on file    Physically abused: Not on file    Forced sexual activity: Not on file  Other Topics Concern  . Not on file  Social History Narrative  . Not on file    Past Medical History, Surgical history, Social history, and Family history were reviewed and updated as appropriate.   Please see review of systems for further details on the patient's review from today.   Review of Systems:  Review of Systems  Constitutional: Negative for chills, diaphoresis and fever.  HENT: Negative for trouble swallowing and voice change.   Respiratory: Negative for cough, chest tightness, shortness of breath and wheezing.   Cardiovascular: Negative for chest pain and palpitations.  Gastrointestinal: Positive for abdominal distention. Negative for abdominal pain, constipation, diarrhea, nausea and vomiting.  Musculoskeletal: Negative for back pain and myalgias.  Neurological: Negative for dizziness, light-headedness and  headaches.    Objective:   Physical Exam:  BP (!) 105/54 (BP Location: Left Arm)   Pulse 70   Temp 98.1 F (36.7 C) (Oral)   Resp 18   Ht 5\' 2"  (1.575 m)   Wt 147 lb 4.8 oz (66.8 kg)   SpO2 100%   BMI 26.94 kg/m  ECOG: 0  Physical Exam Constitutional:      General: She is not in acute distress.    Appearance: She is not diaphoretic.  HENT:     Head: Normocephalic and atraumatic.  Eyes:     General: No scleral icterus.       Right eye: No discharge.        Left eye: No discharge.     Conjunctiva/sclera: Conjunctivae normal.  Cardiovascular:     Rate and Rhythm:  Normal rate and regular rhythm.     Heart sounds: Normal heart sounds. No murmur. No friction rub. No gallop.   Pulmonary:     Effort: Pulmonary effort is normal. No respiratory distress.     Breath sounds: Normal breath sounds. No wheezing or rales.  Abdominal:     General: Bowel sounds are normal. There is no distension.     Palpations: Abdomen is soft. There is no mass.     Tenderness: There is no abdominal tenderness. There is no guarding or rebound.  Musculoskeletal:     Right lower leg: No edema.     Left lower leg: No edema.  Skin:    General: Skin is warm and dry.     Findings: No erythema or rash.  Neurological:     Mental Status: She is alert.     Coordination: Coordination normal.     Gait: Gait normal.  Psychiatric:        Mood and Affect: Mood normal.        Behavior: Behavior normal.        Thought Content: Thought content normal.        Judgment: Judgment normal.     Lab Review:     Component Value Date/Time   NA 140 06/02/2018 0937   NA 140 02/02/2017 1317   K 3.7 06/02/2018 0937   K 4.0 02/02/2017 1317   CL 108 06/02/2018 0937   CO2 25 06/02/2018 0937   CO2 25 02/02/2017 1317   GLUCOSE 124 (H) 06/02/2018 0937   GLUCOSE 121 02/02/2017 1317   BUN 20 06/02/2018 0937   BUN 13.8 02/02/2017 1317   CREATININE 0.71 06/02/2018 0937   CREATININE 0.72 03/03/2017 1533   CREATININE  0.8 02/02/2017 1317   CALCIUM 8.7 (L) 06/02/2018 0937   CALCIUM 9.3 02/02/2017 1317   PROT 6.9 06/02/2018 0937   PROT 6.9 02/02/2017 1317   ALBUMIN 3.2 (L) 06/02/2018 0937   ALBUMIN 3.5 02/02/2017 1317   AST 19 06/02/2018 0937   AST 47 (H) 02/02/2017 1317   ALT <6 06/02/2018 0937   ALT 23 02/02/2017 1317   ALKPHOS 97 06/02/2018 0937   ALKPHOS 98 02/02/2017 1317   BILITOT <0.2 (L) 06/02/2018 0937   BILITOT <0.22 02/02/2017 1317   GFRNONAA >60 06/02/2018 0937   GFRAA >60 06/02/2018 0937       Component Value Date/Time   WBC 4.4 06/02/2018 0937   WBC 5.0 08/29/2017 0928   RBC 3.47 (L) 06/02/2018 0937   HGB 10.3 (L) 06/02/2018 0937   HGB 10.4 (L) 02/02/2017 1317   HCT 33.6 (L) 06/02/2018 0937   HCT 33.9 (L) 02/02/2017 1317   PLT 167 06/02/2018 0937   PLT 172 02/02/2017 1317   MCV 96.8 06/02/2018 0937   MCV 87.6 02/02/2017 1317   MCH 29.7 06/02/2018 0937   MCHC 30.7 06/02/2018 0937   RDW 13.9 06/02/2018 0937   RDW 14.1 02/02/2017 1317   LYMPHSABS 1.0 06/02/2018 0937   LYMPHSABS 0.7 (L) 02/02/2017 1317   MONOABS 0.6 06/02/2018 0937   MONOABS 0.1 02/02/2017 1317   EOSABS 0.2 06/02/2018 0937   EOSABS 0.0 02/02/2017 1317   BASOSABS 0.1 06/02/2018 0937   BASOSABS 0.0 02/02/2017 1317   -------------------------------  Imaging from last 24 hours (if applicable):  Radiology interpretation: No results found.    OK to treat pending CMP and pregnancy test.  Sandi Mealy, MHS, PA-C

## 2018-06-02 NOTE — Progress Notes (Signed)
Interpreter present for visit with PA Lucianne Lei.

## 2018-06-02 NOTE — Patient Instructions (Signed)
[name] Discharge Instructions for Patients Receiving Chemotherapy  Today you received the following chemotherapy agents Carfilzomib (KYPROLIS).  To help prevent nausea and vomiting after your treatment, we encourage you to take your nausea medication as prescribed.   If you develop nausea and vomiting that is not controlled by your nausea medication, call the clinic.   BELOW ARE SYMPTOMS THAT SHOULD BE REPORTED IMMEDIATELY:  *FEVER GREATER THAN 100.5 F  *CHILLS WITH OR WITHOUT FEVER  NAUSEA AND VOMITING THAT IS NOT CONTROLLED WITH YOUR NAUSEA MEDICATION  *UNUSUAL SHORTNESS OF BREATH  *UNUSUAL BRUISING OR BLEEDING  TENDERNESS IN MOUTH AND THROAT WITH OR WITHOUT PRESENCE OF ULCERS  *URINARY PROBLEMS  *BOWEL PROBLEMS  UNUSUAL RASH Items with * indicate a potential emergency and should be followed up as soon as possible.  Feel free to call the clinic should you have any questions or concerns. The clinic phone number is [phone].  Please show the [place] at check-in to the Emergency Department and triage nurse.  Coronavirus (COVID-19) Are you at risk?  Are you at risk for the Coronavirus (COVID-19)?  To be considered HIGH RISK for Coronavirus (COVID-19), you have to meet the following criteria:  . Traveled to [place], [place], [place], [place] or [place]; or in the [place] to [place], [place], [place], or [place]; and have fever, cough, and shortness of breath within the last 2 weeks of travel OR . Been in close contact with a person diagnosed with COVID-19 within the last 2 weeks and have fever, cough, and shortness of breath . IF YOU DO NOT MEET THESE CRITERIA, YOU ARE CONSIDERED LOW RISK FOR COVID-19.  What to do if you are HIGH RISK for COVID-19?  Marland Kitchen If you are having a medical emergency, call 911. . Seek medical care right away. Before you go to a doctor's office, urgent care or emergency department, call ahead and tell them  about your recent travel, contact with someone diagnosed with COVID-19, and your symptoms. You should receive instructions from your physician's office regarding next steps of care.  . When you arrive at healthcare provider, tell the healthcare staff immediately you have returned from visiting [place], [place], [place], [place] or [place]; or traveled in the [place] to [place], [place], [place], or [place]; in the last two weeks or you have been in close contact with a person diagnosed with COVID-19 in the last 2 weeks.   . Tell the health care staff about your symptoms: fever, cough and shortness of breath. . After you have been seen by a medical provider, you will be either: o Tested for (COVID-19) and discharged home on quarantine except to seek medical care if symptoms worsen, and asked to  - Stay home and avoid contact with others until you get your results (4-5 days)  - Avoid travel on public transportation if possible (such as bus, train, or airplane) or o Sent to the Emergency Department by EMS for evaluation, COVID-19 testing, and possible admission depending on your condition and test results.  What to do if you are LOW RISK for COVID-19?  Reduce your risk of any infection by using the same precautions used for avoiding the common cold or flu:  Marland Kitchen Wash your hands often with soap and warm water for at least 20 seconds.  If soap and water are not readily available, use an alcohol-based hand sanitizer with at least 60% alcohol.  . If coughing or  sneezing, cover your mouth and nose by coughing or sneezing into the elbow areas of your shirt or coat, into a tissue or into your sleeve (not your hands). . Avoid shaking hands with others and consider head nods or verbal greetings only. . Avoid touching your eyes, nose, or mouth with unwashed hands.  . Avoid close contact with people who are sick. . Avoid places or events with large numbers of people in one location, like concerts or  sporting events. . Carefully consider travel plans you have or are making. . If you are planning any travel outside or inside the [place], visit the CDC's Travelers' Health webpage for the latest health notices. . If you have some symptoms but not all symptoms, continue to monitor at home and seek medical attention if your symptoms worsen. . If you are having a medical emergency, call 911.   [place] / e-Visit: [email or web address]         [facility]: [facility]: [phone]                   [facility]: [phone]

## 2018-06-02 NOTE — Patient Instructions (Signed)

## 2018-06-03 ENCOUNTER — Telehealth: Payer: Self-pay

## 2018-06-03 LAB — MULTIPLE MYELOMA PANEL, SERUM
Albumin SerPl Elph-Mcnc: 3.5 g/dL (ref 2.9–4.4)
Albumin/Glob SerPl: 1.2 (ref 0.7–1.7)
Alpha 1: 0.2 g/dL (ref 0.0–0.4)
Alpha2 Glob SerPl Elph-Mcnc: 0.8 g/dL (ref 0.4–1.0)
B-Globulin SerPl Elph-Mcnc: 0.9 g/dL (ref 0.7–1.3)
Gamma Glob SerPl Elph-Mcnc: 1.2 g/dL (ref 0.4–1.8)
Globulin, Total: 3.1 g/dL (ref 2.2–3.9)
IgA: 234 mg/dL (ref 87–352)
IgG (Immunoglobin G), Serum: 1369 mg/dL (ref 586–1602)
IgM (Immunoglobulin M), Srm: 41 mg/dL (ref 26–217)
Total Protein ELP: 6.6 g/dL (ref 6.0–8.5)

## 2018-06-03 LAB — KAPPA/LAMBDA LIGHT CHAINS
Kappa free light chain: 45.4 mg/L — ABNORMAL HIGH (ref 3.3–19.4)
Kappa, lambda light chain ratio: 1.11 (ref 0.26–1.65)
Lambda free light chains: 40.8 mg/L — ABNORMAL HIGH (ref 5.7–26.3)

## 2018-06-03 NOTE — Telephone Encounter (Signed)
Spoke with [facility] by [employer] to arrange shipment of Pomalyst to our office including the patient's survey.

## 2018-06-04 ENCOUNTER — Telehealth: Payer: Self-pay

## 2018-06-04 NOTE — Telephone Encounter (Signed)
Spoke with patient's daughter to let her know we have her Pomalyst at our office to be picked up.

## 2018-06-07 ENCOUNTER — Telehealth: Payer: Self-pay

## 2018-06-07 NOTE — Telephone Encounter (Signed)
Patient picked up Pomalyst had her sign packing slip, sent to HIM for scan to chart.

## 2018-06-14 MED FILL — OMEPRAZOLE 20 MG CPDR: 20 | 30 days supply | Qty: 30 | Fill #5

## 2018-06-14 MED FILL — ACYCLOVIR 800 MG TABLET: 800 | 30 days supply | Qty: 60 | Fill #0

## 2018-06-14 MED FILL — TENOFOVIR DISOPROXIL FUMARA: 300 | 30 days supply | Qty: 30 | Fill #4

## 2018-07-05 ENCOUNTER — Telehealth: Payer: Self-pay | Admitting: Hematology

## 2018-07-05 NOTE — Telephone Encounter (Signed)
Scheduled appt per 5/29 sch message - unable to reach patient. Left message with apt date and time

## 2018-07-06 ENCOUNTER — Other Ambulatory Visit: Payer: Self-pay

## 2018-07-06 ENCOUNTER — Inpatient Hospital Stay: Payer: Medicaid Other

## 2018-07-06 ENCOUNTER — Inpatient Hospital Stay: Payer: Medicaid Other | Attending: Hematology

## 2018-07-06 ENCOUNTER — Encounter: Payer: Self-pay | Admitting: Nurse Practitioner

## 2018-07-06 ENCOUNTER — Inpatient Hospital Stay (HOSPITAL_BASED_OUTPATIENT_CLINIC_OR_DEPARTMENT_OTHER): Payer: Medicaid Other | Admitting: Nurse Practitioner

## 2018-07-06 VITALS — BP 111/70 | HR 72 | Temp 97.9°F | Resp 18 | Ht 62.0 in | Wt 146.4 lb

## 2018-07-06 DIAGNOSIS — E099 Drug or chemical induced diabetes mellitus without complications: Secondary | ICD-10-CM

## 2018-07-06 DIAGNOSIS — Z5189 Encounter for other specified aftercare: Secondary | ICD-10-CM | POA: Insufficient documentation

## 2018-07-06 DIAGNOSIS — C9012 Plasma cell leukemia in relapse: Secondary | ICD-10-CM

## 2018-07-06 DIAGNOSIS — B181 Chronic viral hepatitis B without delta-agent: Secondary | ICD-10-CM | POA: Insufficient documentation

## 2018-07-06 DIAGNOSIS — M5412 Radiculopathy, cervical region: Secondary | ICD-10-CM

## 2018-07-06 DIAGNOSIS — K219 Gastro-esophageal reflux disease without esophagitis: Secondary | ICD-10-CM

## 2018-07-06 DIAGNOSIS — C9010 Plasma cell leukemia not having achieved remission: Secondary | ICD-10-CM

## 2018-07-06 DIAGNOSIS — Z79899 Other long term (current) drug therapy: Secondary | ICD-10-CM

## 2018-07-06 DIAGNOSIS — Z5112 Encounter for antineoplastic immunotherapy: Secondary | ICD-10-CM | POA: Insufficient documentation

## 2018-07-06 DIAGNOSIS — E119 Type 2 diabetes mellitus without complications: Secondary | ICD-10-CM | POA: Insufficient documentation

## 2018-07-06 DIAGNOSIS — Z95828 Presence of other vascular implants and grafts: Secondary | ICD-10-CM

## 2018-07-06 LAB — CMP (CANCER CENTER ONLY)
ALT: <6 U/L (ref 0–44)
AST: 25 U/L (ref 15–41)
Albumin: 3.4 g/dL — ABNORMAL LOW (ref 3.5–5.0)
Alkaline Phosphatase: 92 U/L (ref 38–126)
Anion gap: 7 (ref 5–15)
BUN: 16 mg/dL (ref 6–20)
CO2: 28 mmol/L (ref 22–32)
Calcium: 9.5 mg/dL (ref 8.9–10.3)
Chloride: 107 mmol/L (ref 98–111)
Creatinine: 0.81 mg/dL (ref 0.44–1.00)
GFR, Est AFR Am: >60 mL/min (ref >60)
GFR, Estimated: >60 mL/min (ref >60)
Glucose, Bld: 99 mg/dL (ref 70–99)
Potassium: 3.5 mmol/L (ref 3.5–5.1)
Sodium: 142 mmol/L (ref 135–145)
Total Bilirubin: 0.2 mg/dL — ABNORMAL LOW (ref 0.3–1.2)
Total Protein: 7.5 g/dL (ref 6.5–8.1)

## 2018-07-06 LAB — CBC WITH DIFFERENTIAL (CANCER CENTER ONLY)
Abs Immature Granulocytes: 0.01 10*3/uL (ref 0.00–0.07)
Basophils Absolute: 0 10*3/uL (ref 0.0–0.1)
Basophils Relative: 1 %
Eosinophils Absolute: 0.2 10*3/uL (ref 0.0–0.5)
Eosinophils Relative: 7 %
HCT: 35.9 % — ABNORMAL LOW (ref 36.0–46.0)
Hemoglobin: 10.9 g/dL — ABNORMAL LOW (ref 12.0–15.0)
Immature Granulocytes: 0 %
Lymphocytes Relative: 45 %
Lymphs Abs: 1.2 10*3/uL (ref 0.7–4.0)
MCH: 29.1 pg (ref 26.0–34.0)
MCHC: 30.4 g/dL (ref 30.0–36.0)
MCV: 95.7 fL (ref 80.0–100.0)
Monocytes Absolute: 0.4 10*3/uL (ref 0.1–1.0)
Monocytes Relative: 14 %
Neutro Abs: 0.9 10*3/uL — ABNORMAL LOW (ref 1.7–7.7)
Neutrophils Relative %: 33 %
Platelet Count: 245 10*3/uL (ref 150–400)
RBC: 3.75 MIL/uL — ABNORMAL LOW (ref 3.87–5.11)
RDW: 13.3 % (ref 11.5–15.5)
WBC Count: 2.8 10*3/uL — ABNORMAL LOW (ref 4.0–10.5)
nRBC: 0 % (ref 0.0–0.2)

## 2018-07-06 LAB — PREGNANCY, URINE: Preg Test, Ur: NEGATIVE

## 2018-07-06 MED ORDER — SODIUM CHLORIDE 0.9% FLUSH
10.0000 mL | INTRAVENOUS | Status: DC | PRN
  Administered 2018-07-06: 10 mL
  Filled 2018-07-06: qty 10

## 2018-07-06 MED ORDER — PROCHLORPERAZINE MALEATE 10 MG PO TABS
ORAL_TABLET | ORAL | Status: AC
  Filled 2018-07-06: qty 1

## 2018-07-06 MED ORDER — ALTEPLASE 2 MG IJ SOLR
2.0000 mg | Freq: Once | INTRAMUSCULAR | Status: AC | PRN
  Administered 2018-07-06: 2 mg
  Filled 2018-07-06: qty 2

## 2018-07-06 MED ORDER — DEXAMETHASONE SODIUM PHOSPHATE 10 MG/ML IJ SOLN
INTRAMUSCULAR | Status: AC
  Filled 2018-07-06: qty 1

## 2018-07-06 MED ORDER — PALONOSETRON HCL INJECTION 0.25 MG/5ML
INTRAVENOUS | Status: AC
  Filled 2018-07-06: qty 5

## 2018-07-06 MED ORDER — SODIUM CHLORIDE 0.9 % IV SOLN
Freq: Once | INTRAVENOUS | Status: AC
  Administered 2018-07-06: 16:00:00 via INTRAVENOUS
  Filled 2018-07-06: qty 250

## 2018-07-06 MED ORDER — PALONOSETRON HCL INJECTION 0.25 MG/5ML
0.2500 mg | Freq: Once | INTRAVENOUS | Status: AC
  Administered 2018-07-06: 0.25 mg via INTRAVENOUS

## 2018-07-06 MED ORDER — POMALIDOMIDE 2 MG PO CAPS: 2.0000 mg | ORAL_CAPSULE | Freq: Every day | ORAL | 0 refills | Status: DC

## 2018-07-06 MED ORDER — DEXTROSE 5 % IV SOLN
56.0000 mg/m2 | Freq: Once | INTRAVENOUS | Status: AC
  Administered 2018-07-06: 90 mg via INTRAVENOUS
  Filled 2018-07-06: qty 30

## 2018-07-06 MED ORDER — SODIUM CHLORIDE 0.9% FLUSH
10.0000 mL | INTRAVENOUS | Status: DC | PRN
  Administered 2018-07-06: 18:00:00 10 mL
  Filled 2018-07-06: qty 10

## 2018-07-06 MED ORDER — PROCHLORPERAZINE MALEATE 10 MG PO TABS
10.0000 mg | ORAL_TABLET | Freq: Once | ORAL | Status: AC
  Administered 2018-07-06: 10 mg via ORAL

## 2018-07-06 MED ORDER — OMEPRAZOLE 20 MG PO CPDR: 20.0000 mg | DELAYED_RELEASE_CAPSULE | Freq: Every day | ORAL | 5 refills | Status: DC

## 2018-07-06 MED ORDER — ALTEPLASE 2 MG IJ SOLR
INTRAMUSCULAR | Status: AC
  Filled 2018-07-06: qty 2

## 2018-07-06 MED ORDER — DEXAMETHASONE SODIUM PHOSPHATE 10 MG/ML IJ SOLN
8.0000 mg | Freq: Once | INTRAMUSCULAR | Status: AC
  Administered 2018-07-06: 8 mg via INTRAVENOUS

## 2018-07-06 MED ORDER — HEPARIN SOD (PORK) LOCK FLUSH 100 UNIT/ML IV SOLN
500.0000 [IU] | Freq: Once | INTRAVENOUS | Status: AC | PRN
  Administered 2018-07-06: 500 [IU]
  Filled 2018-07-06: qty 5

## 2018-07-06 MED FILL — OMEPRAZOLE 20 MG CPDR: 20 | 30 days supply | Qty: 30 | Fill #0

## 2018-07-06 NOTE — Progress Notes (Signed)
Ok per Dr. Burr Medico for Kyprolis 56 mg/m2 today.  ANC = 900 Holding Pomalyst a little longer to allow Sheryl [name] to recover. [name], Pharm.D., CPP 07/06/2018@3 :56 PM

## 2018-07-06 NOTE — Patient Instructions (Signed)
[facility] Discharge Instructions for Patients Receiving Chemotherapy  Today you received the following chemotherapy agents:  Kyprolis  To help prevent nausea and vomiting after your treatment, we encourage you to take your nausea medication as prescribed.   If you develop nausea and vomiting that is not controlled by your nausea medication, call the clinic.   BELOW ARE SYMPTOMS THAT SHOULD BE REPORTED IMMEDIATELY:  *FEVER GREATER THAN 100.5 F  *CHILLS WITH OR WITHOUT FEVER  NAUSEA AND VOMITING THAT IS NOT CONTROLLED WITH YOUR NAUSEA MEDICATION  *UNUSUAL SHORTNESS OF BREATH  *UNUSUAL BRUISING OR BLEEDING  TENDERNESS IN MOUTH AND THROAT WITH OR WITHOUT PRESENCE OF ULCERS  *URINARY PROBLEMS  *BOWEL PROBLEMS  UNUSUAL RASH Items with * indicate a potential emergency and should be followed up as soon as possible.  Feel free to call the clinic should you have any questions or concerns. The clinic phone number is [phone].  Please show the CHEMO ALERT CARD at check-in to the Emergency Department and triage nurse.   

## 2018-07-06 NOTE — Progress Notes (Signed)
Per Cira Rue, NP ok to treat with ANC 0.9

## 2018-07-06 NOTE — Patient Instructions (Signed)

## 2018-07-06 NOTE — Progress Notes (Signed)
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    10/10/2014 Miscellaneous    Peripheral blood chemistry and leukocytosis with total white count 78K, comprised of large plasma cells and his normocytic anemia. There is a myeloid left shift with previous surgical radium blasts. Flow cytometry showed 64% plasma cells    10/10/2014 Bone Marrow Biopsy    Markedly hypercellular marrow (95%), Atypical plasma cells comprise 57% of the cellularity. There was diminished multilineage in hematopoiesis with adequate maturation. Breasts less than 1%), no overt dysplasia of the myeloid or erythroid lineages.     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    05/07/2015 - 05/12/2015 Hospital Admission    [facility], [facility]. ID workup was negative, EGD  showed evidence of gastritis and duodenitis, no H. pylori or CMV.    05/20/2015 - 05/24/2015 Hospital Admission    [facility]    07/11/2015 Bone Marrow Biopsy    Post transplant 100 a bone marrow biopsy showed hypocellular marrow, 20%, no increase in plasma cells (2%) or other abnormalities.    08/22/2015 - 01/02/2016 Chemotherapy    MaintenanceCyborD (cytoxan 3096mm2 iv, bortezomib 1.5 mg/m, dexamethasone 40 mg, every 2 weeks, changed to Velcade maintenance after 4 months treatment    01/16/2016 - 04/19/2016 Chemotherapy    Maintenance Velcade 1.3 mg/m every 2 weeks    02/22/2016 Pathology Results    BONE MARROW: Diagnosis Bone Marrow, Aspirate,Biopsy, and Clot, right iliac - NORMOCELLULAR BONE MARROW FOR AGE WITH TRILINEAGE HEMATOPOIESIS. - PLASMACYTOSIS (PLASMA CELLS 12%). - SEE COMMENT. PERIPHERAL BLOOD: - OCCASIONAL CIRCULATING PLASMA CELLS.    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     04/18/2016 - 03/26/2017 Chemotherapy    Daratumumab per protocol  CyBorD every week, cytoxan was held after 04/24/2016 dye to cytopenia and infection  -discontinued due to Hep B flare    05/11/2016 - 05/16/2016 Hospital Admission    Healthcare-associated pneumonia    02/16/2017 - 02/21/2017 Hospital Admission    Admission diagnosis: Abnormal LFT's Additional comments: Assoc diagnoses: Hep B flare, transminitis, dehydration, fever, SIRS    04/06/2017 -  Chemotherapy    Carfilzomib 56 mg/m2 on days 1 and 15(except 2032m2 on C1D1 and C1D2)with dexamethasone 20 mg on same days and pomalidomide 2 mg on days 1-21 of 28 day cycleon 04/13/17, dexa stopped on  07/06/2017 due to her CR  -She restarted her Pomalyst in 03/2018 (off for 2-3 months due to insurance, delivery issues)    08/26/2017 - 08/28/2017 Hospital Admission    Admit date: 08/26/2017 Admission diagnosis: RLL Pneumonia      04/19/2018 Bone Marrow Biopsy    Bone Marrow Biopsy 04/19/18 at [facility] Bone Marrow (BM) and Peripheral Blood (PB) FINAL PATHOLOGIC DIAGNOSIS BONE MARROW: Hypocellular marrow (20%) with trilineage hematopoiesis. No [plasma cell myeloma identified. See comment. PERIPHERAL BLOOD: Mild anemia. No circulating plasma cells seen. See CBC data and comment.      CURRENT THERAPY: Carfilzomib 56 mg/m2 on days 1 and 15(except 55m/m2 on C1D1 and C1D2)with dexamethasone 20 mg on same days and pomalidomide 2 mg on days 1-21 of 28 day cycleon 04/13/17, dexa stopped on 07/06/2017 due to her CR -Sherestarted herPomalystin 03/2018(off for 2-3 months due to insurance, delivery issues)  INTERVAL HISTORY: Sheryl [name] for follow up and treatment as scheduled. She completed cycle 15 day 1 kyprolis on 4/29. Last cycle Pomalyst ended 5/28. She was not scheduled for kyprolis in May and did not [place] She feels well today. Notes "tired eyes" and drowsiness. Has occasional mild headache. Denies vision change. Appetite is normal. Has occasional abdominal bloating. Denies n/v. Bowels are normal. Denies bleeding. Denies fever, chills, cough, chest pain, dyspnea, leg edema, or neuropathy.    MEDICAL HISTORY:  Past Medical History:  Diagnosis Date  . Chills with fever    intermittently since d/c from hospital  . Dysuria-frequency syndrome    w/ pink urine  . GERD (gastroesophageal reflux disease)   . Hepatitis   . History of positive PPD    DX 2011--  CXR DONE NO EVIDENCE  . History of ureter stent   . Hydronephrosis, right   . Neuromuscular disorder (HCC)    legs numb intermittently  . 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   . Pneumonia   . Right ureteral stone   . Urosepsis 8/14   [facility]    SURGICAL HISTORY: Past Surgical History:  Procedure Laterality Date  . CYSTOSCOPY W/ URETERAL STENT PLACEMENT Right 09/25/2012   Procedure: CYSTOSCOPY WITH RETROGRADE  PYELOGRAM/URETERAL STENT PLACEMENT;  Surgeon: TAlexis Frock MD;  Location: [facility];  Service: Urology;  Laterality: Right;  . CYSTOSCOPY WITH RETROGRADE PYELOGRAM, URETEROSCOPY AND STENT PLACEMENT Right 10/15/2012   Procedure: CYSTOSCOPY WITH RETROGRADE PYELOGRAM, URETEROSCOPY AND REMOVAL STENT WITH  STENT PLACEMENT;  Surgeon: TAlexis Frock MD;  [facility]  Service: Urology;  Laterality: Right;  . ESOPHAGOGASTRODUODENOSCOPY (EGD) WITH PROPOFOL N/A 11/16/2014   Procedure: ESOPHAGOGASTRODUODENOSCOPY (EGD) WITH PROPOFOL;  Surgeon: DMilus Banister MD;  Location: [facility];  Service: Endoscopy;  Laterality: N/A;  . HOLMIUM LASER APPLICATION Right 99/32/6712  Procedure: HOLMIUM LASER APPLICATION;  Surgeon: TAlexis Frock MD;  [facility]  Service: Urology;  Laterality: Right;  . LIVER BIOPSY    . OTHER SURGICAL HISTORY Right    removal of ovarian cyst  . removal of uterine cyst     years ago  . RIGHT VATS W/ DRAINAGE PEURAL EFFUSION AND BX'S  10-30-2008    I have reviewed the social history and family history with the patient and they are unchanged from previous note.  ALLERGIES:  has No Known Allergies.  MEDICATIONS:  Current Outpatient Medications  Medication Sig Dispense Refill  . acyclovir (ZOVIRAX) 800 MG tablet Take 1 tablet (800 mg total) by mouth 2 (two) times daily. 60 tablet  3  . omeprazole (PRILOSEC) 20 MG capsule Take 1 capsule (20 mg total) by mouth daily. 30 capsule 5  . cyclobenzaprine (FLEXERIL) 5 MG tablet Take 1 tablet (5 mg total) by mouth 3 (three) times daily as needed for muscle spasms. (Patient not taking: Reported on 07/06/2018) 30 tablet 1  . dexamethasone (DECADRON) 4 MG tablet Take 1 tablet (4 mg total) by mouth daily. (Patient not taking: Reported on 07/06/2018) 5 tablet 0  . feeding supplement, ENSURE ENLIVE, (ENSURE ENLIVE) LIQD Take 237 mLs by mouth 2 (two) times daily between meals. (Patient not taking:  Reported on 07/06/2018) 237 mL 12  . gabapentin (NEURONTIN) 300 MG capsule Take 1 capsule (300 mg total) by mouth 3 (three) times daily. (Patient not taking: Reported on 07/06/2018) 60 capsule 0  . ibuprofen (ADVIL,MOTRIN) 200 MG tablet Take 200 mg by mouth every 6 (six) hours as needed for moderate pain.    . magic mouthwash SOLN Take 5 mLs by mouth 4 (four) times daily as needed for mouth pain. (Patient not taking: Reported on 07/06/2018) 240 mL 3  . ondansetron (ZOFRAN ODT) 8 MG disintegrating tablet Take 1 tablet (8 mg total) by mouth every 8 (eight) hours as needed for nausea or vomiting. (Patient not taking: Reported on 07/06/2018) 40 tablet 1  . oxyCODONE (OXY IR/ROXICODONE) 5 MG immediate release tablet Take 1 tablet (5 mg total) by mouth every 12 (twelve) hours as needed for severe pain. (Patient not taking: Reported on 07/06/2018) 15 tablet 0  . polyethylene glycol (MIRALAX / GLYCOLAX) packet Take 17 g by mouth daily as needed (constipation). (Patient not taking: Reported on 07/06/2018) 14 each 1  . pomalidomide (POMALYST) 2 MG capsule Take 1 capsule (2 mg total) by mouth daily. Take with water on days 1-21. Repeat every 28 days. 21 capsule 0  . potassium chloride (KLOR-CON) 20 MEQ packet Take 20 mEq by mouth 2 (two) times daily. 1 pack twice daily for 3 days then 1 pack daily (Patient not taking: Reported on 07/06/2018) 14 packet 0  . prochlorperazine (COMPAZINE) 10 MG tablet Take 1 tablet (10 mg total) by mouth every 6 (six) hours as needed for nausea or vomiting. (Patient not taking: Reported on 07/06/2018) 30 tablet 3  . promethazine (PHENERGAN) 25 MG tablet Take 1 tablet (25 mg total) by mouth every 6 (six) hours as needed for nausea or vomiting. (Patient not taking: Reported on 07/06/2018) 30 tablet 0  . simethicone (MYLICON) 80 MG chewable tablet 1 to 2 PO QID prn abdominal bloating (Patient not taking: Reported on 07/06/2018) 100 tablet 2  . traMADol (ULTRAM) 50 MG tablet Take 1 tablet (50 mg total) by  mouth every 6 (six) hours as needed. (Patient not taking: Reported on 07/06/2018) 30 tablet 0  . VIREAD 300 MG tablet Take 1 tablet (300 mg total) by mouth daily. (Patient not taking: Reported on 07/06/2018) 30 tablet 11   No current facility-administered medications for this visit.    Facility-Administered Medications Ordered in Other Visits  Medication Dose Route Frequency Provider Last Rate Last Dose  . 0.9 %  sodium chloride infusion   Intravenous Once Truitt Merle, MD      . carfilzomib (KYPROLIS) 90 mg in dextrose 5 % 100 mL chemo infusion  56 mg/m2 (Treatment Plan Recorded) Intravenous Once Truitt Merle, MD      . dexamethasone (DECADRON) injection 8 mg  8 mg Intravenous Once Truitt Merle, MD      . heparin lock flush 100  unit/mL  500 Units Intracatheter Once PRN Truitt Merle, MD      . palonosetron (ALOXI) injection 0.25 mg  0.25 mg Intravenous Once Truitt Merle, MD      . prochlorperazine (COMPAZINE) tablet 10 mg  10 mg Oral Once Truitt Merle, MD      . sodium chloride 0.9 % injection 10 mL  10 mL Intravenous PRN Truitt Merle, MD   10 mL at 12/11/15 1532  . sodium chloride 0.9 % injection 10 mL  10 mL Intravenous PRN Truitt Merle, MD   10 mL at 06/11/16 1505  . sodium chloride 0.9 % injection 10 mL  10 mL Intravenous PRN Truitt Merle, MD   10 mL at 08/04/17 1643  . sodium chloride flush (NS) 0.9 % injection 10 mL  10 mL Intravenous PRN Truitt Merle, MD   10 mL at 10/09/15 1717  . sodium chloride flush (NS) 0.9 % injection 10 mL  10 mL Intracatheter PRN Truitt Merle, MD        PHYSICAL EXAMINATION: ECOG PERFORMANCE STATUS: 1 - Symptomatic but completely ambulatory  Vitals:   07/06/18 1455  BP: 111/70  Pulse: 72  Resp: 18  Temp: 97.9 F (36.6 C)  SpO2: 100%   Filed Weights   07/06/18 1455  Weight: 146 lb 6.4 oz (66.4 kg)    GENERAL:alert, no distress and comfortable SKIN: no obvious rash  EYES: sclera clear LUNGS: clear to auscultation with normal breathing effort HEART: regular rate & rhythm, no lower  extremity edema ABDOMEN:abdomen soft, non-tender and normal bowel sounds. No hepatomegaly  Musculoskeletal:no cyanosis of digits and no clubbing  NEURO: alert & oriented x 3 with fluent speech, normal gait PAC without erythema  Limited exam for covid19  LABORATORY DATA:  I have reviewed the data as listed CBC Latest Ref Rng & Units 07/06/2018 06/02/2018 05/19/2018  WBC 4.0 - 10.5 K/uL 2.8(L) 4.4 5.1  Hemoglobin 12.0 - 15.0 g/dL 10.9(L) 10.3(L) 10.7(L)  Hematocrit 36.0 - 46.0 % 35.9(L) 33.6(L) 34.4(L)  Platelets 150 - 400 K/uL 245 167 296     CMP Latest Ref Rng & Units 07/06/2018 06/02/2018 05/19/2018  Glucose 70 - 99 mg/dL 99 124(H) 95  BUN 6 - 20 mg/dL '16 20 14  ' Creatinine 0.44 - 1.00 mg/dL 0.81 0.71 0.68  Sodium 135 - 145 mmol/L 142 140 143  Potassium 3.5 - 5.1 mmol/L 3.5 3.7 3.9  Chloride 98 - 111 mmol/L 107 108 108  CO2 22 - 32 mmol/L '28 25 24  ' Calcium 8.9 - 10.3 mg/dL 9.5 8.7(L) 8.9  Total Protein 6.5 - 8.1 g/dL 7.5 6.9 7.0  Total Bilirubin 0.3 - 1.2 mg/dL 0.2(L) <0.2(L) <0.2(L)  Alkaline Phos 38 - 126 U/L 92 97 107  AST 15 - 41 U/L '25 19 26  ' ALT 0 - 44 U/L <6 <6 10      RADIOGRAPHIC STUDIES: I have personally reviewed the radiological images as listed and agreed with the findings in the report. No results found.   ASSESSMENT & PLAN: Sheryl [name] is a 48 y.o. female with   1. 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  -She was diagnosed in 10/2014. She was treated with CyborD and bone marrow transplant. She progressed on maintenance Velcade in 02/2016. She was treated with more Dara and CyborD, which was stopped after a severe Hep B flare. -She is currently being treatedwith Carfilzomib and Pomalyst.She is tolerating well overall. -Shewas off Pomalyst for 2-3 months for  insurance/refill issue and restarted Pomalystin 03/2018. Continue to ship her medication to [facility] for her to pick up.  -04/2018 [facility] Marrow biopsy [facility] which no residual  leukemia cells. Dr. Norma Fredrickson reviewed her biopsy and considered her to be in remission. This was previously reviewed by Dr. Burr Medico  -today she appears well. She completed kyprolis cycle 15 day 1 on 06/02/18, was not scheduled for treatment in May, and pomalyst cycle on 5/28. Labs reviewed, ANC 0.9, otherwise CBC and CMP unremarkable. MM Panel is pending. M protein on 4/29 undetectable.  -She is doing well, afebrile. I reviewed with Dr. Burr Medico and pharmacy. Will proceed with kyprolis at full dose 56 mg/m2, she will postpone starting pomalyst until next f/u in 2 weeks to allow [place] to recover.  -we reviewed her schedule and neutropenic precautions, she understands -[facility] refill to [facility] -f/u and next treatment in 2 weeks   2.Cervical radiculopathy -Her MRI Spine from 03/09/18 showedmild cervical disk bulging, but overall benign. This is likely nerve related. -she wasevaluated by neurologist Dr. Mickeal Skinner in our cancer center, and received a 5 days of steroids, much improved now. -denies pain today  3.Chronichepatitis B infection -Shehas knownchronichepatitis B infection,developed significant transaminitis and hyperbilirubinemiainJanuary 2019 -completed treatment -She will continue to be closely followed by ID Dr. Baxter Flattery  -LFTs WNL  4. Type 2 diabetes, steroids induced -Previouslytreated with Metformin.No longer ondexamethasone. -resolved   5. GERD, history of GI bleeding and nausea -Colonoscopy on 11/26/16 per Dr. Ardis Hughs notable for a polyp in the transverse colon, revealed to be tubular adenoma and negative for high grade dysplasia or malignancy.  -Continue omeprazole daily.  -Nausea has near resolved now   Plan -Labs reviewed, MM panel pending -Proceed with Kyprolis 56 mg/m2 today, continue q2 weeks  -postpone next cycle of Pomalyst, plan to begin in 2 weeks after f/u -f/u with Dr. Burr Medico and treatment in 2 weeks  -Refilled Pomalyst, omeprazole  All questions were  answered. The patient knows to call the clinic with any problems, questions or concerns. No barriers to learning was detected.     Alla Feeling, NP 07/06/18

## 2018-07-06 NOTE — Progress Notes (Signed)
Pt has double port, accessed both and no blood return for either. CATH-FLOW was given by Delfin Gant, RN and PT was sent back to the lab for blood drawl.

## 2018-07-07 ENCOUNTER — Telehealth: Payer: Self-pay | Admitting: Nurse Practitioner

## 2018-07-07 ENCOUNTER — Other Ambulatory Visit: Payer: Self-pay | Admitting: *Deleted

## 2018-07-07 DIAGNOSIS — C9012 Plasma cell leukemia in relapse: Secondary | ICD-10-CM

## 2018-07-07 LAB — KAPPA/LAMBDA LIGHT CHAINS
Kappa free light chain: 55.4 mg/L — ABNORMAL HIGH (ref 3.3–19.4)
Kappa, lambda light chain ratio: 0.86 (ref 0.26–1.65)
Lambda free light chains: 64.5 mg/L — ABNORMAL HIGH (ref 5.7–26.3)

## 2018-07-07 LAB — MULTIPLE MYELOMA PANEL, SERUM
Albumin SerPl Elph-Mcnc: 3.4 g/dL (ref 2.9–4.4)
Albumin/Glob SerPl: 1 (ref 0.7–1.7)
Alpha 1: 0.3 g/dL (ref 0.0–0.4)
Alpha2 Glob SerPl Elph-Mcnc: 0.8 g/dL (ref 0.4–1.0)
B-Globulin SerPl Elph-Mcnc: 1.1 g/dL (ref 0.7–1.3)
Gamma Glob SerPl Elph-Mcnc: 1.5 g/dL (ref 0.4–1.8)
Globulin, Total: 3.6 g/dL (ref 2.2–3.9)
IgA: 332 mg/dL (ref 87–352)
IgG (Immunoglobin G), Serum: 1435 mg/dL (ref 586–1602)
IgM (Immunoglobulin M), Srm: 49 mg/dL (ref 26–217)
Total Protein ELP: 7 g/dL (ref 6.0–8.5)

## 2018-07-07 MED ORDER — POMALIDOMIDE 2 MG PO CAPS: 2.0000 mg | ORAL_CAPSULE | Freq: Every day | ORAL | 0 refills | Status: DC

## 2018-07-07 NOTE — Telephone Encounter (Signed)
Scheduled appt per 6/2 los.  Patient aware of appt date and time.

## 2018-07-12 MED FILL — TENOFOVIR DISOPROXIL FUMARA: 300 | 30 days supply | Qty: 30 | Fill #5

## 2018-07-13 ENCOUNTER — Telehealth: Payer: Self-pay | Admitting: *Deleted

## 2018-07-13 NOTE — Telephone Encounter (Signed)
Received call from [place] by [place] needing information for shipment of polmalyst & stating that [place] exp today & request return call by 3 pm.  Returned call & arrangements made for shipment to be delivered at office tomorrow.

## 2018-07-14 ENCOUNTER — Telehealth: Payer: Self-pay | Admitting: *Deleted

## 2018-07-14 NOTE — Telephone Encounter (Signed)
Called & spoke with pt's daughter & she will p/u pomalyst script tomorrow.

## 2018-07-15 ENCOUNTER — Other Ambulatory Visit: Payer: Self-pay | Admitting: Lab

## 2018-07-15 NOTE — Progress Notes (Signed)
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   10/10/2014 Miscellaneous   Peripheral blood chemistry and leukocytosis with total white count 78K, comprised of large plasma cells and his normocytic anemia. There is a myeloid left shift with previous surgical radium blasts. Flow cytometry showed 64% plasma cells   10/10/2014 Bone Marrow Biopsy   Markedly hypercellular marrow (95%), Atypical plasma cells comprise 57% of the cellularity. There was diminished multilineage in hematopoiesis with adequate maturation. Breasts less than 1%), no overt dysplasia of the myeloid or erythroid lineages.    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   05/07/2015 - 05/12/2015 Hospital Admission   [facility], [facility] ID  workup was negative, EGD showed evidence of gastritis and duodenitis, no H. pylori or CMV.   05/20/2015 - 05/24/2015 Hospital Admission   [facility]   07/11/2015 Bone Marrow Biopsy   Post transplant 100 a bone marrow biopsy showed hypocellular marrow, 20%, no increase in plasma cells (2%) or other abnormalities.   08/22/2015 - 01/02/2016 Chemotherapy   MaintenanceCyborD (cytoxan 3079mm2 iv, bortezomib 1.5 mg/m, dexamethasone 40 mg, every 2 weeks, changed to Velcade maintenance after 4 months treatment   01/16/2016 - 04/19/2016 Chemotherapy   Maintenance Velcade 1.3 mg/m every 2 weeks   02/22/2016 Pathology Results   BONE MARROW: Diagnosis Bone Marrow, Aspirate,Biopsy, and Clot, right iliac - NORMOCELLULAR BONE MARROW FOR AGE WITH TRILINEAGE HEMATOPOIESIS. - PLASMACYTOSIS (PLASMA CELLS 12%). - SEE COMMENT. PERIPHERAL BLOOD: - OCCASIONAL CIRCULATING PLASMA CELLS.   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    04/18/2016 - 03/26/2017 Chemotherapy   Daratumumab per protocol  CyBorD every week, cytoxan was held after 04/24/2016 dye to cytopenia and infection  -discontinued due to Hep B flare   05/11/2016 - 05/16/2016 Hospital Admission   Healthcare-associated pneumonia   02/16/2017 - 02/21/2017 Hospital Admission   Admission diagnosis: Abnormal LFT's Additional comments: Assoc diagnoses: Hep B flare, transminitis, dehydration, fever, SIRS   04/06/2017 -  Chemotherapy   Carfilzomib 56 mg/m2 on days 1 and 15(except 201m2 on C1D1 and C1D2)with dexamethasone 20 mg on same days and pomalidomide 2 mg on days 1-21 of 28 day cycleon 04/13/17, dexa stopped on 07/06/2017 due to her CR  -She restarted her Pomalyst in 03/2018 (off for 2-3 months due to insurance, delivery issues)   08/26/2017 - 08/28/2017 Hospital Admission  Admit date: 08/26/2017 Admission diagnosis: [facility] Pneumonia     04/19/2018 Bone Marrow Biopsy   Bone Marrow Biopsy 04/19/18 at [facility] Bone Marrow (BM) and Peripheral Blood (PB) FINAL PATHOLOGIC DIAGNOSIS BONE MARROW: Hypocellular marrow (20%) with trilineage hematopoiesis. No [plasma cell myeloma identified. See comment. PERIPHERAL BLOOD: Mild anemia. No circulating plasma cells seen. See CBC data and comment.       CURRENT THERAPY:  Carfilzomib 56 mg/m2 on days 1 and 15(except 51m/m2 on C1D1 and C1D2)with dexamethasone 20 mg on same days and pomalidomide 2 mg on days 1-21 of 28 day cycleon 04/13/17, dexa stopped on 07/06/2017 due to her CR -Sherestarted herPomalystin 03/2018(off for 2-3 months due to insurance, delivery issues)  INTERVAL HISTORY:  Sheryl [name] is here for a follow up and treatment. She presents to the clinic with her interpretor. She notes last month she was not scheduled for appointment so treatment was skipped. She notes the last 3 weeks she was not on Pomalyst because NP Lacie instructed her to hold given anemia.  She notes having body ache sensations when on treatment. This is tolerable with no obvious pain. She plans to follow up with Dr RNorma Fredricksonon 08/09/18. I reviewed her medication list with her today. She is no longer taking oxycodone.  She notes feeling chilly at times when laying still. She denies fever. She notes though she is waiting transplant donor her family is willing to donate. She will discuss this with Dr RNorma Fredrickson  She notes she work at [employer] hours for 2 days a week and is able to tolerate the work. She drives 1 hour to work.      REVIEW OF SYSTEMS:   Constitutional: Denies fevers, chills or abnormal weight loss (+) Chills intermittently  Eyes: Denies blurriness of vision Ears, nose, mouth, throat, and face: Denies mucositis or sore throat Respiratory: Denies cough, dyspnea or wheezes Cardiovascular: Denies palpitation, chest discomfort or lower extremity swelling  Gastrointestinal:  Denies nausea, heartburn or change in bowel habits Skin: Denies abnormal skin rashes MSK: (+) Body aches tolerable  Lymphatics: Denies new lymphadenopathy or easy bruising Neurological:Denies numbness, tingling or new weaknesses Behavioral/Psych: Mood is stable, no new changes  All other systems were reviewed with the patient and are negative.  MEDICAL HISTORY:  Past Medical History:  Diagnosis Date  . Chills with fever    intermittently since d/c from hospital  . Dysuria-frequency syndrome    w/ pink urine  . GERD (gastroesophageal reflux disease)   . Hepatitis   . History of positive PPD    DX 2011--  CXR DONE NO EVIDENCE  . History of ureter stent   . Hydronephrosis, right   . Neuromuscular disorder ([facility])    legs numb intermittently  . 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[place]   . Pneumonia   . Right ureteral stone   . Urosepsis 8/14   ad[facility]    SURGICAL HISTORY: Past Surgical History:  Procedure Laterality Date  . CYSTOSCOPY W/ URETERAL STENT PLACEMENT Right 09/25/2012   Procedure: CYSTOSCOPY WITH RETROGRADE PYELOGRAM/URETERAL STENT PLACEMENT;  Surgeon: TAlexis Frock MD;  Location: [facility];  Service: Urology;  Laterality: Right;  . CYSTOSCOPY WITH RETROGRADE PYELOGRAM, URETEROSCOPY AND STENT PLACEMENT Right 10/15/2012   Procedure: CYSTOSCOPY WITH RETROGRADE PYELOGRAM, URETEROSCOPY AND REMOVAL STENT WITH  STENT PLACEMENT;  Surgeon: TAlexis Frock MD;  [facility]  Service: Urology;  Laterality: Right;  . ESOPHAGOGASTRODUODENOSCOPY (EGD) WITH PROPOFOL N/A 11/16/2014   Procedure: ESOPHAGOGASTRODUODENOSCOPY (EGD) WITH PROPOFOL;  Surgeon: Milus Banister, MD;  Location: [facility];  Service: Endoscopy;  Laterality: N/A;  . HOLMIUM LASER APPLICATION Right 6/38/7564   Procedure: HOLMIUM LASER APPLICATION;  Surgeon: Alexis Frock, MD;  Location: [facility];  Service: Urology;  Laterality: Right;  . LIVER BIOPSY    .  OTHER SURGICAL HISTORY Right    removal of ovarian cyst  . removal of uterine cyst     years ago  . RIGHT VATS W/ DRAINAGE PEURAL EFFUSION AND BX'S  10-30-2008    I have reviewed the social history and family history with the patient and they are unchanged from previous note.  ALLERGIES:  has No Known Allergies.  MEDICATIONS:  Current Outpatient Medications  Medication Sig Dispense Refill  . acyclovir (ZOVIRAX) 800 MG tablet Take 1 tablet (800 mg total) by mouth 2 (two) times daily. 60 tablet 3  . ibuprofen (ADVIL,MOTRIN) 200 MG tablet Take 200 mg by mouth every 6 (six) hours as needed for moderate pain.    Marland Kitchen omeprazole (PRILOSEC) 20 MG capsule Take 1 capsule (20 mg total) by mouth daily. 30 capsule 5  . traMADol (ULTRAM) 50 MG tablet Take 1 tablet (50 mg total) by mouth every 6 (six) hours as needed. 30 tablet 0  . VIREAD 300 MG tablet Take 1 tablet (300 mg total) by mouth daily. 30 tablet 11  . cyclobenzaprine (FLEXERIL) 5 MG tablet Take 1 tablet (5 mg total) by mouth 3 (three) times daily as needed for muscle spasms. (Patient not taking: Reported on 07/19/2018) 30 tablet 1  . feeding supplement, ENSURE ENLIVE, (ENSURE ENLIVE) LIQD Take 237 mLs by mouth 2 (two) times daily between meals. (Patient not taking: Reported on 07/06/2018) 237 mL 12  . magic mouthwash SOLN Take 5 mLs by mouth 4 (four) times daily as needed for mouth pain. (Patient not taking: Reported on 07/06/2018) 240 mL 3  . ondansetron (ZOFRAN ODT) 8 MG disintegrating tablet Take 1 tablet (8 mg total) by mouth every 8 (eight) hours as needed for nausea or vomiting. (Patient not taking: Reported on 07/06/2018) 40 tablet 1  . polyethylene glycol (MIRALAX / GLYCOLAX) packet Take 17 g by mouth daily as needed (constipation). (Patient not taking: Reported on 07/06/2018) 14 each 1  . pomalidomide (POMALYST) 2 MG capsule Take 1 capsule (2 mg total) by mouth daily. Take with water on days 1-21. Repeat every 28 days. (Patient not taking:  Reported on 07/19/2018) 21 capsule 0  . prochlorperazine (COMPAZINE) 10 MG tablet Take 1 tablet (10 mg total) by mouth every 6 (six) hours as needed for nausea or vomiting. (Patient not taking: Reported on 07/06/2018) 30 tablet 3  . promethazine (PHENERGAN) 25 MG tablet Take 1 tablet (25 mg total) by mouth every 6 (six) hours as needed for nausea or vomiting. (Patient not taking: Reported on 07/06/2018) 30 tablet 0  . simethicone (MYLICON) 80 MG chewable tablet 1 to 2 PO QID prn abdominal bloating (Patient not taking: Reported on 07/06/2018) 100 tablet 2   No current facility-administered medications for this visit.    Facility-Administered Medications Ordered in Other Visits  Medication Dose Route Frequency Provider Last Rate Last Dose  . sodium chloride 0.9 % injection 10 mL  10 mL Intravenous PRN Truitt Merle, MD   10 mL at 12/11/15 1532  . sodium chloride 0.9 % injection 10 mL  10 mL Intravenous PRN Truitt Merle, MD   10 mL at 06/11/16 1505  . sodium chloride 0.9 % injection 10  mL  10 mL Intravenous PRN Truitt Merle, MD   10 mL at 08/04/17 1643  . sodium chloride flush (NS) 0.9 % injection 10 mL  10 mL Intravenous PRN Truitt Merle, MD   10 mL at 10/09/15 1717    PHYSICAL EXAMINATION: ECOG PERFORMANCE STATUS: 1 - Symptomatic but completely ambulatory  Vitals:   07/19/18 0935  BP: 108/60  Pulse: 72  Resp: 17  Temp: 98.1 F (36.7 C)  SpO2: 100%   Filed Weights   07/19/18 0935  Weight: 145 lb (65.8 kg)    GENERAL:alert, no distress and comfortable SKIN: skin color, texture, turgor are normal, no rashes or significant lesions EYES: normal, Conjunctiva are pink and non-injected, sclera clear  NECK: supple, thyroid normal size, non-tender, without nodularity LYMPH:  no palpable lymphadenopathy in the cervical, axillary  LUNGS: clear to auscultation and percussion with normal breathing effort HEART: regular rate & rhythm and no murmurs and no lower extremity edema ABDOMEN:abdomen soft, non-tender and  normal bowel sounds Musculoskeletal:no cyanosis of digits and no clubbing  NEURO: alert & oriented x 3 with fluent speech, no focal motor/sensory deficits  LABORATORY DATA:  I have reviewed the data as listed CBC Latest Ref Rng & Units 07/19/2018 07/06/2018 06/02/2018  WBC 4.0 - 10.5 K/uL 4.3 2.8(L) 4.4  Hemoglobin 12.0 - 15.0 g/dL 11.2(L) 10.9(L) 10.3(L)  Hematocrit 36.0 - 46.0 % 35.9(L) 35.9(L) 33.6(L)  Platelets 150 - 400 K/uL 297 245 167     CMP Latest Ref Rng & Units 07/19/2018 07/06/2018 06/02/2018  Glucose 70 - 99 mg/dL 126(H) 99 124(H)  BUN 6 - 20 mg/dL _0 Creatinine 0.44 - 1.00 mg/dL 0.74 0.81 0.71  Sodium 135 - 145 mmol/L 142 142 140  Potassium 3.5 - 5.1 mmol/L 3.8 3.5 3.7  Chloride 98 - 111 mmol/L 108 107 108  CO2 22 - 32 mmol/L _1 Calcium 8.9 - 10.3 mg/dL 9.6 9.5 8.7(L)  Total Protein 6.5 - 8.1 g/dL 7.2 7.5 6.9  Total Bilirubin 0.3 - 1.2 mg/dL 0.3 0.2(L) <0.2(L)  Alkaline Phos 38 - 126 U/L 69 92 97  AST 15 - 41 U/L _2 ALT 0 - 44 U/L 7 <6 <6      RADIOGRAPHIC STUDIES: I have personally reviewed the radiological images as listed and agreed with the findings in the report. No results found.   ASSESSMENT & PLAN:  Sheryl [name] is a 48 y.o. female with   1. 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  -She was diagnosed in 10/2014. She was treated with CyborD and bone marrow transplant. She progressed on maintenance Velcade in 02/2016. She was treated with more Dara and CyborD, which was stopped after a severe Hep B flare. -She is currently being treatedwith Carfilzomib and Pomalyst.She is tolerating well overall. -Sherun out Pomalyst for 2-3 months and restarted Pomalystin 03/2018. Continue to ship her medication to [facility] for her to pick up.  -I discussed with her the 04/2018 Bone Marrow biopsy from [facility] which no residual leukemia cells. Dr. Norma Fredrickson reviewed her biopsy and considered her to be in remission. She is currently  waiting on donor availability for bone marrow transplant.  -She has been having painless body aches which can be related to treatment or work. Not using pain medication. She has been experiences chills when still, afebrile. She is at risk for infection. I instructed her to contact us if she has chills with fever.  -  She was not scheduled for treatment last month and missed. I encouraged her to stay on treatment plan and call us if no appointments scheduled for her -Labs reviewed, CBC and CMP WNL except Hg 11.2, BG126. Overall adequate to proceed with IVCarfilzomibtoday.  -Her M-protein has been negative, light chains levels WNL, her leukemia is still in remission  -She has been off Pomalyst for 3 weeks due to pancytopenia. She will restart Pomalyst today. -She will f/u with Dr Norma Fredrickson on 7/6.  -F/u in 2 weeks with NP Lacie   2.Cervical radiculopathy -Ongoingsince late 12/2017 butgot worse in early 2020 -She denies history of neck trauma or injury -Her MRI Spine from 03/09/18 showedmild cervical disk bulging, but overall benign. This is likely nerve related. -she wasevaluated by neurologist Dr. Mickeal Skinner in our cancer center, and received a 5 days of steroids, much improved now.Off pain meds or gabapentin   3.Chronichepatitis B infection -Shehas knownchronichepatitis B infection,developed significant transaminitis and hyperbilirubinemiainJanuary 2019 -Sherestartedher Heb Btreatmentin 02/2017, controlled now -She will continue to be closely followed by ID Dr. Baxter Flattery   4. Type 2 diabetes, steroids induced -Previouslytreated with Metformin.No longer ondexamethasone. -Has much improved and stable. Better controlled lately.   5. GERD, history of GI bleeding and nausea -Colonoscopy on 11/26/16 per Dr. Ardis Hughs notable for a polyp in the transverse colon, revealed to be tubular adenoma and negative for high grade dysplasia or malignancy.  -Continue omeprazole daily. We  previously discussed steroids can worsen her acid reflux -Nausea currently resolved given she was off treatment.   Plan -Restart Pomalyst today for 3 weeks on and one week off, will refill for her in 2 weeks when she returns  -Labs reviewed and adequate to proceed with IVCarfilzomibtodayand continue every 2 weeks -Lab, flush, f/u with Lacie and treatment in 2 weeks   No problem-specific Assessment & Plan notes found for this encounter.   Orders Placed This Encounter  Procedures  . Pregnancy, urine    First on 08/02/2018    Standing Status:   Standing    Number of Occurrences:   20    Standing Expiration Date:   07/19/2023   All questions were answered. The patient knows to call the clinic with any problems, questions or concerns. No barriers to learning was detected. I spent 20 minutes counseling the patient face to face. The total time spent in the appointment was 25 minutes and more than 50% was on counseling and review of test results     Truitt Merle, MD 07/19/2018   I, Joslyn Devon, am acting as scribe for Truitt Merle, MD.   I have reviewed the above documentation for accuracy and completeness, and I agree with the above.

## 2018-07-19 ENCOUNTER — Inpatient Hospital Stay: Payer: Medicaid Other

## 2018-07-19 ENCOUNTER — Other Ambulatory Visit: Payer: Self-pay | Admitting: *Deleted

## 2018-07-19 ENCOUNTER — Encounter: Payer: Self-pay | Admitting: Hematology

## 2018-07-19 ENCOUNTER — Other Ambulatory Visit: Payer: Self-pay

## 2018-07-19 ENCOUNTER — Inpatient Hospital Stay (HOSPITAL_BASED_OUTPATIENT_CLINIC_OR_DEPARTMENT_OTHER): Payer: Medicaid Other | Admitting: Hematology

## 2018-07-19 VITALS — BP 108/60 | HR 72 | Temp 98.1°F | Resp 17 | Ht 62.0 in | Wt 145.0 lb

## 2018-07-19 DIAGNOSIS — Z79899 Other long term (current) drug therapy: Secondary | ICD-10-CM

## 2018-07-19 DIAGNOSIS — Z5112 Encounter for antineoplastic immunotherapy: Secondary | ICD-10-CM | POA: Diagnosis not present

## 2018-07-19 DIAGNOSIS — D649 Anemia, unspecified: Secondary | ICD-10-CM

## 2018-07-19 DIAGNOSIS — E099 Drug or chemical induced diabetes mellitus without complications: Secondary | ICD-10-CM | POA: Diagnosis not present

## 2018-07-19 DIAGNOSIS — B181 Chronic viral hepatitis B without delta-agent: Secondary | ICD-10-CM | POA: Diagnosis not present

## 2018-07-19 DIAGNOSIS — C9012 Plasma cell leukemia in relapse: Secondary | ICD-10-CM

## 2018-07-19 DIAGNOSIS — T380X5A Adverse effect of glucocorticoids and synthetic analogues, initial encounter: Secondary | ICD-10-CM

## 2018-07-19 DIAGNOSIS — Z95828 Presence of other vascular implants and grafts: Secondary | ICD-10-CM

## 2018-07-19 DIAGNOSIS — C9010 Plasma cell leukemia not having achieved remission: Secondary | ICD-10-CM

## 2018-07-19 DIAGNOSIS — M5412 Radiculopathy, cervical region: Secondary | ICD-10-CM

## 2018-07-19 DIAGNOSIS — K219 Gastro-esophageal reflux disease without esophagitis: Secondary | ICD-10-CM

## 2018-07-19 LAB — CBC WITH DIFFERENTIAL (CANCER CENTER ONLY)
Abs Immature Granulocytes: 0.01 10*3/uL (ref 0.00–0.07)
Basophils Absolute: 0.1 10*3/uL (ref 0.0–0.1)
Basophils Relative: 1 %
Eosinophils Absolute: 0 10*3/uL (ref 0.0–0.5)
Eosinophils Relative: 1 %
HCT: 35.9 % — ABNORMAL LOW (ref 36.0–46.0)
Hemoglobin: 11.2 g/dL — ABNORMAL LOW (ref 12.0–15.0)
Immature Granulocytes: 0 %
Lymphocytes Relative: 25 %
Lymphs Abs: 1.1 10*3/uL (ref 0.7–4.0)
MCH: 29.5 pg (ref 26.0–34.0)
MCHC: 31.2 g/dL (ref 30.0–36.0)
MCV: 94.5 fL (ref 80.0–100.0)
Monocytes Absolute: 0.3 10*3/uL (ref 0.1–1.0)
Monocytes Relative: 8 %
Neutro Abs: 2.8 10*3/uL (ref 1.7–7.7)
Neutrophils Relative %: 65 %
Platelet Count: 297 10*3/uL (ref 150–400)
RBC: 3.8 MIL/uL — ABNORMAL LOW (ref 3.87–5.11)
RDW: 13.4 % (ref 11.5–15.5)
WBC Count: 4.3 10*3/uL (ref 4.0–10.5)
nRBC: 0 % (ref 0.0–0.2)

## 2018-07-19 LAB — CMP (CANCER CENTER ONLY)
ALT: 7 U/L (ref 0–44)
AST: 24 U/L (ref 15–41)
Albumin: 3.4 g/dL — ABNORMAL LOW (ref 3.5–5.0)
Alkaline Phosphatase: 69 U/L (ref 38–126)
Anion gap: 9 (ref 5–15)
BUN: 18 mg/dL (ref 6–20)
CO2: 25 mmol/L (ref 22–32)
Calcium: 9.6 mg/dL (ref 8.9–10.3)
Chloride: 108 mmol/L (ref 98–111)
Creatinine: 0.74 mg/dL (ref 0.44–1.00)
GFR, Est AFR Am: >60 mL/min (ref >60)
GFR, Estimated: >60 mL/min (ref >60)
Glucose, Bld: 126 mg/dL — ABNORMAL HIGH (ref 70–99)
Potassium: 3.8 mmol/L (ref 3.5–5.1)
Sodium: 142 mmol/L (ref 135–145)
Total Bilirubin: 0.3 mg/dL (ref 0.3–1.2)
Total Protein: 7.2 g/dL (ref 6.5–8.1)

## 2018-07-19 MED ORDER — PALONOSETRON HCL INJECTION 0.25 MG/5ML
INTRAVENOUS | Status: AC
  Filled 2018-07-19: qty 5

## 2018-07-19 MED ORDER — SODIUM CHLORIDE 0.9 % IV SOLN
Freq: Once | INTRAVENOUS | Status: AC
  Administered 2018-07-19: 10:00:00 via INTRAVENOUS
  Filled 2018-07-19: qty 250

## 2018-07-19 MED ORDER — SODIUM CHLORIDE 0.9 % IV SOLN
Freq: Once | INTRAVENOUS | Status: DC
  Filled 2018-07-19: qty 250

## 2018-07-19 MED ORDER — SODIUM CHLORIDE 0.9% FLUSH
10.0000 mL | Freq: Once | INTRAVENOUS | Status: AC
  Administered 2018-07-19: 10 mL
  Filled 2018-07-19: qty 10

## 2018-07-19 MED ORDER — HEPARIN SOD (PORK) LOCK FLUSH 100 UNIT/ML IV SOLN
500.0000 [IU] | Freq: Once | INTRAVENOUS | Status: AC | PRN
  Administered 2018-07-19: 500 [IU]
  Filled 2018-07-19: qty 5

## 2018-07-19 MED ORDER — SODIUM CHLORIDE 0.9% FLUSH
10.0000 mL | INTRAVENOUS | Status: DC | PRN
  Administered 2018-07-19: 12:00:00 10 mL
  Filled 2018-07-19: qty 10

## 2018-07-19 MED ORDER — PROCHLORPERAZINE MALEATE 10 MG PO TABS
ORAL_TABLET | ORAL | Status: AC
  Filled 2018-07-19: qty 1

## 2018-07-19 MED ORDER — PROCHLORPERAZINE MALEATE 10 MG PO TABS
10.0000 mg | ORAL_TABLET | Freq: Once | ORAL | Status: AC
  Administered 2018-07-19: 10 mg via ORAL

## 2018-07-19 MED ORDER — DEXAMETHASONE SODIUM PHOSPHATE 10 MG/ML IJ SOLN
INTRAMUSCULAR | Status: AC
  Filled 2018-07-19: qty 1

## 2018-07-19 MED ORDER — PALONOSETRON HCL INJECTION 0.25 MG/5ML
0.2500 mg | Freq: Once | INTRAVENOUS | Status: AC
  Administered 2018-07-19: 0.25 mg via INTRAVENOUS

## 2018-07-19 MED ORDER — DEXAMETHASONE SODIUM PHOSPHATE 10 MG/ML IJ SOLN
8.0000 mg | Freq: Once | INTRAMUSCULAR | Status: AC
  Administered 2018-07-19: 8 mg via INTRAVENOUS

## 2018-07-19 MED ORDER — DEXTROSE 5 % IV SOLN
56.0000 mg/m2 | Freq: Once | INTRAVENOUS | Status: AC
  Administered 2018-07-19: 90 mg via INTRAVENOUS
  Filled 2018-07-19: qty 30

## 2018-07-19 NOTE — Patient Instructions (Signed)
[facility] Discharge Instructions for Patients Receiving Chemotherapy  Today you received the following chemotherapy agents : Kyprolis.  To help prevent nausea and vomiting after your treatment, we encourage you to take your nausea medication as prescribed.   If you develop nausea and vomiting that is not controlled by your nausea medication, call the clinic.   BELOW ARE SYMPTOMS THAT SHOULD BE REPORTED IMMEDIATELY:  *FEVER GREATER THAN 100.5 F  *CHILLS WITH OR WITHOUT FEVER  NAUSEA AND VOMITING THAT IS NOT CONTROLLED WITH YOUR NAUSEA MEDICATION  *UNUSUAL SHORTNESS OF BREATH  *UNUSUAL BRUISING OR BLEEDING  TENDERNESS IN MOUTH AND THROAT WITH OR WITHOUT PRESENCE OF ULCERS  *URINARY PROBLEMS  *BOWEL PROBLEMS  UNUSUAL RASH Items with * indicate a potential emergency and should be followed up as soon as possible.  Feel free to call the clinic should you have any questions or concerns. The clinic phone number is [phone].  Please show the [facility] at check-in to the Emergency Department and triage nurse.  This information is directly available on the CDC website: RunningShows.co.za.html    Source:CDC Reference to specific commercial products, manufacturers, companies, or trademarks does not constitute its endorsement or recommendation by the [place], [place], or Centers for [employer] and [employer].

## 2018-07-21 ENCOUNTER — Telehealth: Payer: Self-pay | Admitting: Hematology

## 2018-07-21 NOTE — Telephone Encounter (Signed)
Scheduled appt per 6/15 los.  Treatment will be added later.  Will contact patient once I have all appts scheduled with a set time.

## 2018-08-02 ENCOUNTER — Inpatient Hospital Stay: Payer: Medicaid Other

## 2018-08-02 ENCOUNTER — Other Ambulatory Visit: Payer: Self-pay

## 2018-08-02 ENCOUNTER — Encounter: Payer: Self-pay | Admitting: Nurse Practitioner

## 2018-08-02 ENCOUNTER — Inpatient Hospital Stay (HOSPITAL_BASED_OUTPATIENT_CLINIC_OR_DEPARTMENT_OTHER): Payer: Medicaid Other | Admitting: Nurse Practitioner

## 2018-08-02 VITALS — BP 120/69 | HR 72 | Temp 97.3°F | Resp 18 | Ht 62.0 in | Wt 149.8 lb

## 2018-08-02 DIAGNOSIS — C9012 Plasma cell leukemia in relapse: Secondary | ICD-10-CM

## 2018-08-02 DIAGNOSIS — E119 Type 2 diabetes mellitus without complications: Secondary | ICD-10-CM

## 2018-08-02 DIAGNOSIS — C9011 Plasma cell leukemia in remission: Secondary | ICD-10-CM

## 2018-08-02 DIAGNOSIS — Z95828 Presence of other vascular implants and grafts: Secondary | ICD-10-CM

## 2018-08-02 DIAGNOSIS — B181 Chronic viral hepatitis B without delta-agent: Secondary | ICD-10-CM | POA: Diagnosis not present

## 2018-08-02 DIAGNOSIS — C9010 Plasma cell leukemia not having achieved remission: Secondary | ICD-10-CM

## 2018-08-02 DIAGNOSIS — Z5112 Encounter for antineoplastic immunotherapy: Secondary | ICD-10-CM | POA: Diagnosis not present

## 2018-08-02 LAB — CBC WITH DIFFERENTIAL (CANCER CENTER ONLY)
Abs Immature Granulocytes: 0.03 10*3/uL (ref 0.00–0.07)
Basophils Absolute: 0 10*3/uL (ref 0.0–0.1)
Basophils Relative: 1 %
Eosinophils Absolute: 0.2 10*3/uL (ref 0.0–0.5)
Eosinophils Relative: 3 %
HCT: 34.3 % — ABNORMAL LOW (ref 36.0–46.0)
Hemoglobin: 10.7 g/dL — ABNORMAL LOW (ref 12.0–15.0)
Immature Granulocytes: 1 %
Lymphocytes Relative: 20 %
Lymphs Abs: 1.1 10*3/uL (ref 0.7–4.0)
MCH: 29.2 pg (ref 26.0–34.0)
MCHC: 31.2 g/dL (ref 30.0–36.0)
MCV: 93.7 fL (ref 80.0–100.0)
Monocytes Absolute: 0.8 10*3/uL (ref 0.1–1.0)
Monocytes Relative: 16 %
Neutro Abs: 3.2 10*3/uL (ref 1.7–7.7)
Neutrophils Relative %: 59 %
Platelet Count: 218 10*3/uL (ref 150–400)
RBC: 3.66 MIL/uL — ABNORMAL LOW (ref 3.87–5.11)
RDW: 13.4 % (ref 11.5–15.5)
WBC Count: 5.3 10*3/uL (ref 4.0–10.5)
nRBC: 0 % (ref 0.0–0.2)

## 2018-08-02 LAB — CMP (CANCER CENTER ONLY)
ALT: 7 U/L (ref 0–44)
AST: 22 U/L (ref 15–41)
Albumin: 3.3 g/dL — ABNORMAL LOW (ref 3.5–5.0)
Alkaline Phosphatase: 90 U/L (ref 38–126)
Anion gap: 6 (ref 5–15)
BUN: 18 mg/dL (ref 6–20)
CO2: 28 mmol/L (ref 22–32)
Calcium: 8.9 mg/dL (ref 8.9–10.3)
Chloride: 107 mmol/L (ref 98–111)
Creatinine: 0.74 mg/dL (ref 0.44–1.00)
GFR, Est AFR Am: >60 mL/min (ref >60)
GFR, Estimated: >60 mL/min (ref >60)
Glucose, Bld: 91 mg/dL (ref 70–99)
Potassium: 4.1 mmol/L (ref 3.5–5.1)
Sodium: 141 mmol/L (ref 135–145)
Total Bilirubin: 0.2 mg/dL — ABNORMAL LOW (ref 0.3–1.2)
Total Protein: 6.9 g/dL (ref 6.5–8.1)

## 2018-08-02 LAB — PREGNANCY, URINE: Preg Test, Ur: NEGATIVE

## 2018-08-02 MED ORDER — SODIUM CHLORIDE 0.9% FLUSH
10.0000 mL | INTRAVENOUS | Status: DC | PRN
  Administered 2018-08-02: 10 mL
  Filled 2018-08-02: qty 10

## 2018-08-02 MED ORDER — HEPARIN SOD (PORK) LOCK FLUSH 100 UNIT/ML IV SOLN
500.0000 [IU] | Freq: Once | INTRAVENOUS | Status: AC | PRN
  Administered 2018-08-02: 500 [IU]
  Filled 2018-08-02: qty 5

## 2018-08-02 MED ORDER — PALONOSETRON HCL INJECTION 0.25 MG/5ML
INTRAVENOUS | Status: AC
  Filled 2018-08-02: qty 5

## 2018-08-02 MED ORDER — POMALIDOMIDE 2 MG PO CAPS: 2.0000 mg | ORAL_CAPSULE | Freq: Every day | ORAL | 0 refills | Status: DC

## 2018-08-02 MED ORDER — DEXAMETHASONE SODIUM PHOSPHATE 10 MG/ML IJ SOLN
INTRAMUSCULAR | Status: AC
  Filled 2018-08-02: qty 1

## 2018-08-02 MED ORDER — PROCHLORPERAZINE MALEATE 10 MG PO TABS
ORAL_TABLET | ORAL | Status: AC
  Filled 2018-08-02: qty 1

## 2018-08-02 MED ORDER — PALONOSETRON HCL INJECTION 0.25 MG/5ML
0.2500 mg | Freq: Once | INTRAVENOUS | Status: AC
  Administered 2018-08-02: 0.25 mg via INTRAVENOUS

## 2018-08-02 MED ORDER — DEXTROSE 5 % IV SOLN
56.0000 mg/m2 | Freq: Once | INTRAVENOUS | Status: AC
  Administered 2018-08-02: 90 mg via INTRAVENOUS
  Filled 2018-08-02: qty 15

## 2018-08-02 MED ORDER — SODIUM CHLORIDE 0.9 % IV SOLN
Freq: Once | INTRAVENOUS | Status: AC
  Administered 2018-08-02: 14:00:00 via INTRAVENOUS
  Filled 2018-08-02: qty 250

## 2018-08-02 MED ORDER — DEXAMETHASONE SODIUM PHOSPHATE 10 MG/ML IJ SOLN
8.0000 mg | Freq: Once | INTRAMUSCULAR | Status: AC
  Administered 2018-08-02: 8 mg via INTRAVENOUS

## 2018-08-02 MED ORDER — PROCHLORPERAZINE MALEATE 10 MG PO TABS
10.0000 mg | ORAL_TABLET | Freq: Once | ORAL | Status: AC
  Administered 2018-08-02: 10 mg via ORAL

## 2018-08-02 NOTE — Progress Notes (Signed)
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   10/10/2014 Miscellaneous   Peripheral blood chemistry and leukocytosis with total white count 78K, comprised of large plasma cells and his normocytic anemia. There is a myeloid left shift with previous surgical radium blasts. Flow cytometry showed 64% plasma cells   10/10/2014 Bone Marrow Biopsy   Markedly hypercellular marrow (95%), Atypical plasma cells comprise 57% of the cellularity. There was diminished multilineage in hematopoiesis with adequate maturation. Breasts less than 1%), no overt dysplasia of the myeloid or erythroid lineages.    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   05/07/2015 - 05/12/2015 Hospital Admission   [facility], [facility]. ID workup was negative, EGD  showed evidence of gastritis and duodenitis, no H. pylori or CMV.   05/20/2015 - 05/24/2015 Hospital Admission   [facility]   07/11/2015 Bone Marrow Biopsy   Post transplant 100 a bone marrow biopsy showed hypocellular marrow, 20%, no increase in plasma cells (2%) or other abnormalities.   08/22/2015 - 01/02/2016 Chemotherapy   MaintenanceCyborD (cytoxan 3020mm2 iv, bortezomib 1.5 mg/m, dexamethasone 40 mg, every 2 weeks, changed to Velcade maintenance after 4 months treatment   01/16/2016 - 04/19/2016 Chemotherapy   Maintenance Velcade 1.3 mg/m every 2 weeks   02/22/2016 Pathology Results   BONE MARROW: Diagnosis Bone Marrow, Aspirate,Biopsy, and Clot, right iliac - NORMOCELLULAR BONE MARROW FOR AGE WITH TRILINEAGE HEMATOPOIESIS. - PLASMACYTOSIS (PLASMA CELLS 12%). - SEE COMMENT. PERIPHERAL BLOOD: - OCCASIONAL CIRCULATING PLASMA CELLS.   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    04/18/2016 - 03/26/2017 Chemotherapy   Daratumumab per protocol  CyBorD every week, cytoxan was held after 04/24/2016 dye to cytopenia and infection  -discontinued due to Hep B flare   05/11/2016 - 05/16/2016 Hospital Admission   Healthcare-associated pneumonia   02/16/2017 - 02/21/2017 Hospital Admission   Admission diagnosis: Abnormal LFT's Additional comments: Assoc diagnoses: Hep B flare, transminitis, dehydration, fever, SIRS   04/06/2017 -  Chemotherapy   Carfilzomib 56 mg/m2 on days 1 and 15(except 2042m2 on C1D1 and C1D2)with dexamethasone 20 mg on same days and pomalidomide 2 mg on days 1-21 of 28 day cycleon 04/13/17, dexa stopped on 07/06/2017 due to her CR  -She restarted her Pomalyst in 03/2018 (off for 2-3 months due to insurance, delivery issues)   08/26/2017 - 08/28/2017 Hospital Admission   Admit date: 08/26/2017 Admission  diagnosis: RLL Pneumonia    04/19/2018 Bone Marrow Biopsy    Bone Marrow Biopsy 04/19/18 at [facility] Bone Marrow (BM) and Peripheral Blood (PB) FINAL PATHOLOGIC DIAGNOSIS BONE MARROW: Hypocellular marrow (20%) with trilineage hematopoiesis. No [plasma cell myeloma identified. See comment. PERIPHERAL BLOOD: Mild anemia. No circulating plasma cells seen. See CBC data and comment.      CURRENT THERAPY: CURRENT THERAPY: Carfilzomib 56 mg/m2 on days 1 and 15(except 41m/m2 on C1D1 and C1D2)with dexamethasone 20 mg on same days and pomalidomide 2 mg on days 1-21 of 28 day cycleon 04/13/17, dexa stopped on 07/06/2017 due to her CR -Sherestarted herPomalystin 03/2018(off for 2-3 months due to insurance, delivery issues)  INTERVAL HISTORY: Ms. HStubbereturns for f/u and treatment as scheduled. She completed cycle 16 Kyprolis on 07/19/18. She continues pomalyst, will finish this cycle next week then off 1 week. Eating and drinking well. Denies mucositis. She goes not like that she's gained weight. 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 LMP 4 years ago when she started treatment. Energy level is "ok." She has "uneasy feeling" about wearing a mask, doesn't like it covering her mouth/touching her face. She has generalized body aches and tingling which is slightly worse on pomalyst. She does not have any difficulty performing her job or ADLs.   Denies fever, chill, cough, dyspnea, chest pain. Denies n/v/c/d, bleeding, or abdominal pain. Denies leg swelling.     MEDICAL HISTORY:  Past Medical History:  Diagnosis Date  . Chills with fever    intermittently since d/c from hospital  . Dysuria-frequency syndrome    w/ pink urine  . GERD (gastroesophageal reflux disease)   . Hepatitis   . History of positive PPD    DX 2011--  CXR DONE NO EVIDENCE  . History of ureter stent   . Hydronephrosis, right   . Neuromuscular disorder (HCC)    legs numb intermittently  . [place]   . Pneumonia   . Right ureteral stone   . Urosepsis 8/14   [facility]    SURGICAL HISTORY: Past Surgical History:  Procedure Laterality Date  . CYSTOSCOPY W/ URETERAL STENT PLACEMENT Right 09/25/2012   Procedure: CYSTOSCOPY WITH RETROGRADE PYELOGRAM/URETERAL STENT PLACEMENT;  Surgeon: TAlexis Frock MD;  Location: [facility];  Service: Urology;  Laterality: Right;  . CYSTOSCOPY WITH RETROGRADE PYELOGRAM, URETEROSCOPY AND STENT PLACEMENT Right 10/15/2012   Procedure: CYSTOSCOPY WITH RETROGRADE PYELOGRAM, URETEROSCOPY AND REMOVAL STENT WITH  STENT PLACEMENT;  Surgeon: TAlexis Frock MD;  [facility]  Service: Urology;  Laterality: Right;  . ESOPHAGOGASTRODUODENOSCOPY (EGD) WITH PROPOFOL N/A 11/16/2014   Procedure: ESOPHAGOGASTRODUODENOSCOPY (EGD) WITH PROPOFOL;  Surgeon: DMilus Banister MD;  Location: [facility];  Service: Endoscopy;  Laterality: N/A;  . HOLMIUM LASER APPLICATION Right 92/77/4128  Procedure: HOLMIUM LASER APPLICATION;  Surgeon: TAlexis Frock MD;  [facility]  Service: Urology;  Laterality: Right;  . LIVER BIOPSY    . OTHER SURGICAL HISTORY Right    removal of ovarian cyst  . removal of uterine cyst     years ago  . RIGHT VATS W/ DRAINAGE PEURAL EFFUSION AND BX'S  10-30-2008    I have reviewed the social history and family history with the patient and they are unchanged from previous note.  ALLERGIES:  has No Known Allergies.  MEDICATIONS:  Current Outpatient Medications  Medication Sig Dispense Refill  . acyclovir (  ZOVIRAX) 800 MG tablet Take 1 tablet (800 mg total) by mouth 2 (two) times daily. 60 tablet 3  . ibuprofen (ADVIL,MOTRIN) 200 MG tablet Take 200 mg by mouth every 6 (six) hours as needed for moderate pain.    Marland Kitchen omeprazole (PRILOSEC) 20 MG capsule Take 1 capsule (20 mg total) by mouth daily. 30 capsule 5  . VIREAD 300 MG tablet Take 1 tablet (300 mg total) by mouth daily. 30 tablet  11  . cyclobenzaprine (FLEXERIL) 5 MG tablet Take 1 tablet (5 mg total) by mouth 3 (three) times daily as needed for muscle spasms. (Patient not taking: Reported on 07/19/2018) 30 tablet 1  . feeding supplement, ENSURE ENLIVE, (ENSURE ENLIVE) LIQD Take 237 mLs by mouth 2 (two) times daily between meals. (Patient not taking: Reported on 07/06/2018) 237 mL 12  . magic mouthwash SOLN Take 5 mLs by mouth 4 (four) times daily as needed for mouth pain. (Patient not taking: Reported on 07/06/2018) 240 mL 3  . ondansetron (ZOFRAN ODT) 8 MG disintegrating tablet Take 1 tablet (8 mg total) by mouth every 8 (eight) hours as needed for nausea or vomiting. (Patient not taking: Reported on 07/06/2018) 40 tablet 1  . polyethylene glycol (MIRALAX / GLYCOLAX) packet Take 17 g by mouth daily as needed (constipation). (Patient not taking: Reported on 07/06/2018) 14 each 1  . pomalidomide (POMALYST) 2 MG capsule Take 1 capsule (2 mg total) by mouth daily. Take with water on days 1-21. Repeat every 28 days. 21 capsule 0  . prochlorperazine (COMPAZINE) 10 MG tablet Take 1 tablet (10 mg total) by mouth every 6 (six) hours as needed for nausea or vomiting. (Patient not taking: Reported on 07/06/2018) 30 tablet 3  . promethazine (PHENERGAN) 25 MG tablet Take 1 tablet (25 mg total) by mouth every 6 (six) hours as needed for nausea or vomiting. (Patient not taking: Reported on 07/06/2018) 30 tablet 0  . simethicone (MYLICON) 80 MG chewable tablet 1 to 2 PO QID prn abdominal bloating (Patient not taking: Reported on 07/06/2018) 100 tablet 2  . traMADol (ULTRAM) 50 MG tablet Take 1 tablet (50 mg total) by mouth every 6 (six) hours as needed. (Patient not taking: Reported on 08/02/2018) 30 tablet 0   No current facility-administered medications for this visit.    Facility-Administered Medications Ordered in Other Visits  Medication Dose Route Frequency Provider Last Rate Last Dose  . sodium chloride 0.9 % injection 10 mL  10 mL Intravenous PRN  Truitt Merle, MD   10 mL at 12/11/15 1532  . sodium chloride 0.9 % injection 10 mL  10 mL Intravenous PRN Truitt Merle, MD   10 mL at 06/11/16 1505  . sodium chloride 0.9 % injection 10 mL  10 mL Intravenous PRN Truitt Merle, MD   10 mL at 08/04/17 1643  . sodium chloride flush (NS) 0.9 % injection 10 mL  10 mL Intravenous PRN Truitt Merle, MD   10 mL at 10/09/15 1717    PHYSICAL EXAMINATION: ECOG PERFORMANCE STATUS: 1 - Symptomatic but completely ambulatory  Vitals:   08/02/18 1207  BP: 120/69  Pulse: 72  Resp: 18  Temp: (!) 97.3 F (36.3 C)  SpO2: 100%   Filed Weights   08/02/18 1207  Weight: 149 lb 12.8 oz (67.9 kg)    GENERAL:alert, no distress and comfortable SKIN:  no rashes or significant lesions EYES:  sclera clear LUNGS: respirations even and unlabored  HEART: no lower extremity edema Musculoskeletal:no cyanosis of digits  NEURO: alert & oriented x 3 with fluent speech, normal gait. Moderately decreased vibratory sense to fingertips per tuning fork exam  PAC without erythema  Limited exam for covid19 outbreak   LABORATORY DATA:  I have reviewed the data as listed CBC Latest Ref Rng & Units 08/02/2018 07/19/2018 07/06/2018  WBC 4.0 - 10.5 K/uL 5.3 4.3 2.8(L)  Hemoglobin 12.0 - 15.0 g/dL 10.7(L) 11.2(L) 10.9(L)  Hematocrit 36.0 - 46.0 % 34.3(L) 35.9(L) 35.9(L)  Platelets 150 - 400 K/uL 218 297 245     CMP Latest Ref Rng & Units 08/02/2018 07/19/2018 07/06/2018  Glucose 70 - 99 mg/dL 91 126(H) 99  BUN 6 - 20 mg/dL '18 18 16  ' Creatinine 0.44 - 1.00 mg/dL 0.74 0.74 0.81  Sodium 135 - 145 mmol/L 141 142 142  Potassium 3.5 - 5.1 mmol/L 4.1 3.8 3.5  Chloride 98 - 111 mmol/L 107 108 107  CO2 22 - 32 mmol/L '28 25 28  ' Calcium 8.9 - 10.3 mg/dL 8.9 9.6 9.5  Total Protein 6.5 - 8.1 g/dL 6.9 7.2 7.5  Total Bilirubin 0.3 - 1.2 mg/dL 0.2(L) 0.3 0.2(L)  Alkaline Phos 38 - 126 U/L 90 69 92  AST 15 - 41 U/L '22 24 25  ' ALT 0 - 44 U/L 7 7 <6      RADIOGRAPHIC STUDIES: I have personally  reviewed the radiological images as listed and agreed with the findings in the report. No results found.   ASSESSMENT & PLAN: Alaine Hdokis a 48 y.o.femalewith   1. 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 -She was diagnosed in 10/2014. She was treated with CyborD and bone marrow transplant. She progressed on maintenance Velcade in 02/2016. She was treated with more Dara and CyborD, which was stopped after a severe Hep B flare. -She is currently being treatedwith Carfilzomib and Pomalyst.She is tolerating well overall. -Shewas off Pomalyst for 2-3 months for insurance/refill issue and restarted Pomalystin 03/2018. [facility] to [facility] for her to pick up. -[facility]. Dr. Norma Fredrickson reviewed her biopsy and considered her to be in remission. This was previously reviewed by Dr. Burr Medico  -today she appears well. She continues to tolerate treatment well with mild to moderate body aches and neuropathy. She remains functional without limitations.  -she is concerned by her weight gain. I encouraged her to increase physical activity and eat well-balanced diet, and drink water. She will try.  -LMP 4 years ago when she started chemotherapy. Weight gain may be due in part by associated metabolic changes.  -Labs reviewed. ANC WNL, mild anemia stable. CMP stable. Labs adequate to proceed with treatment today -M protein has been negative, light chains WNL; Dr. Burr Medico previously reviewed her leukemia is still in remission. -she will complete current pomalyst cycle in 5 days then off for 1 week, will refill today -f/u in 2 weeks   2.Cervical radiculopathy -Her MRI Spine from 03/09/18 showedmild cervical disk bulging, but overall benign. This is likely nerve related. -she wasevaluated by neurologist Dr. Mickeal Skinner in our cancer center, and received a 5 days of steroids, much improved now. -she has  generalized body aches, not limiting function   3.Chronichepatitis B infection -Shehas knownchronichepatitis B infection,developed significant transaminitis and hyperbilirubinemiainJanuary 2019 -followed by ID Dr. Baxter Flattery  -on Viread 300 mg daily  4. Type 2 diabetes, steroids induced -Previouslytreated with Metformin.No longer ondexamethasone. -resolved   5. GERD, history of GI bleeding and nausea -Colonoscopy on 11/26/16 per  Dr. Ardis Hughs notable for a polyp in the transverse colon, revealed to be tubular adenoma and negative for high grade dysplasia or malignancy.  -continues PPI daily  -denies nausea   PLAN: -Labs reviewed, proceed with cycle 17 carfilzomib today -F/u in 2 weeks with next cycle  -Complete Pomalyst in 5 days, then off 1 week -Refill Pomalyst today  All questions were answered. The patient knows to call the clinic with any problems, questions or concerns. No barriers to learning was detected.     Alla Feeling, NP 08/02/18

## 2018-08-02 NOTE — Patient Instructions (Signed)
[facility] Discharge Instructions for Patients Receiving Chemotherapy  Today you received the following chemotherapy agents : Kyprolis.  To help prevent nausea and vomiting after your treatment, we encourage you to take your nausea medication as prescribed.   If you develop nausea and vomiting that is not controlled by your nausea medication, call the clinic.   BELOW ARE SYMPTOMS THAT SHOULD BE REPORTED IMMEDIATELY:  *FEVER GREATER THAN 100.5 F  *CHILLS WITH OR WITHOUT FEVER  NAUSEA AND VOMITING THAT IS NOT CONTROLLED WITH YOUR NAUSEA MEDICATION  *UNUSUAL SHORTNESS OF BREATH  *UNUSUAL BRUISING OR BLEEDING  TENDERNESS IN MOUTH AND THROAT WITH OR WITHOUT PRESENCE OF ULCERS  *URINARY PROBLEMS  *BOWEL PROBLEMS  UNUSUAL RASH Items with * indicate a potential emergency and should be followed up as soon as possible.  Feel free to call the clinic should you have any questions or concerns. The clinic phone number is [phone].  Please show the [place] at check-in to the Emergency Department and triage nurse.  This information is directly available on the CDC website: RunningShows.co.za.html    Source:CDC Reference to specific commercial products, manufacturers, companies, or trademarks does not constitute its endorsement or recommendation by the [place], [place], or Centers for [employer] and [employer].

## 2018-08-02 NOTE — Progress Notes (Signed)
Lateral port is easier to access.

## 2018-08-02 NOTE — Patient Instructions (Signed)

## 2018-08-03 ENCOUNTER — Telehealth: Payer: Self-pay | Admitting: Nurse Practitioner

## 2018-08-03 NOTE — Telephone Encounter (Signed)
No los per 6/29. °

## 2018-08-15 NOTE — Progress Notes (Signed)
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   10/10/2014 Miscellaneous   Peripheral blood chemistry and leukocytosis with total white count 78K, comprised of large plasma cells and his normocytic anemia. There is a myeloid left shift with previous surgical radium blasts. Flow cytometry showed 64% plasma cells   10/10/2014 Bone Marrow Biopsy   Markedly hypercellular marrow (95%), Atypical plasma cells comprise 57% of the cellularity. There was diminished multilineage in hematopoiesis with adequate maturation. Breasts less than 1%), no overt dysplasia of the myeloid or erythroid lineages.    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   05/07/2015 - 05/12/2015 Hospital Admission   [facility], [facility]. ID workup was negative, EGD  showed evidence of gastritis and duodenitis, no H. pylori or CMV.   05/20/2015 - 05/24/2015 Hospital Admission   [facility]   07/11/2015 Bone Marrow Biopsy   Post transplant 100 a bone marrow biopsy showed hypocellular marrow, 20%, no increase in plasma cells (2%) or other abnormalities.   08/22/2015 - 01/02/2016 Chemotherapy   MaintenanceCyborD (cytoxan 3073mm2 iv, bortezomib 1.5 mg/m, dexamethasone 40 mg, every 2 weeks, changed to Velcade maintenance after 4 months treatment   01/16/2016 - 04/19/2016 Chemotherapy   Maintenance Velcade 1.3 mg/m every 2 weeks   02/22/2016 Pathology Results   BONE MARROW: Diagnosis Bone Marrow, Aspirate,Biopsy, and Clot, right iliac - NORMOCELLULAR BONE MARROW FOR AGE WITH TRILINEAGE HEMATOPOIESIS. - PLASMACYTOSIS (PLASMA CELLS 12%). - SEE COMMENT. PERIPHERAL BLOOD: - OCCASIONAL CIRCULATING PLASMA CELLS.   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    04/18/2016 - 03/26/2017 Chemotherapy   Daratumumab per protocol  CyBorD every week, cytoxan was held after 04/24/2016 dye to cytopenia and infection  -discontinued due to Hep B flare   05/11/2016 - 05/16/2016 Hospital Admission   Healthcare-associated pneumonia   02/16/2017 - 02/21/2017 Hospital Admission   Admission diagnosis: Abnormal LFT's Additional comments: Assoc diagnoses: Hep B flare, transminitis, dehydration, fever, SIRS   04/06/2017 -  Chemotherapy   Carfilzomib 56 mg/m2 on days 1 and 15(except 206m2 on C1D1 and C1D2)with dexamethasone 20 mg on same days and pomalidomide 2 mg on days 1-21 of 28 day cycleon 04/13/17, dexa stopped on 07/06/2017 due to her CR  -She restarted her Pomalyst in 03/2018 (off for 2-3 months due to insurance, delivery issues)   08/26/2017 - 08/28/2017 Hospital Admission   Admit date: 08/26/2017  Admission diagnosis: RLL Pneumonia    04/19/2018 Bone Marrow Biopsy    Bone Marrow Biopsy 04/19/18 at [facility] Bone Marrow (BM) and Peripheral Blood (PB) FINAL PATHOLOGIC DIAGNOSIS BONE MARROW: Hypocellular marrow (20%) with trilineage hematopoiesis. No [plasma cell myeloma identified. See comment. PERIPHERAL BLOOD: Mild anemia. No circulating plasma cells seen. See CBC data and comment.      CURRENT THERAPY: Carfilzomib 56 mg/m2 on days 1 and 15(except 8m/m2 on C1D1 and C1D2)with dexamethasone 20 mg on same days and pomalidomide 2 mg on days 1-21 of 28 day cycleon 04/13/17, dexa stopped on 07/06/2017 due to her CR -Sherestarted herPomalystin 03/2018(off for 2-3 months due to insurance, delivery issues)  INTERVAL HISTORY: Ms. HDumaisreturns for f/u and treatment as scheduled. She completed last cycle Kyprolis on 08/02/18. She was off Pomalyst last week cycle; due to begin new cycle today but did not receive refill. She continues to have general body aches, "heavy eyes," and intermittent headache on treatment. She describes mild weakness intermittently. She does not get much sleep and works a lot. Appetite is normal. Denies n/v. She has occasional LLQ pain and constipation. Last BM 2 days ago. Does not take medication.   Otherwise, denies cough, chest pain, dyspnea, fever, or chills.    MEDICAL HISTORY:  Past Medical History:  Diagnosis Date  . Chills with fever    intermittently since d/c from hospital  . Dysuria-frequency syndrome    w/ pink urine  . GERD (gastroesophageal reflux disease)   . Hepatitis   . History of positive PPD    DX 2011--  CXR DONE NO EVIDENCE  . History of ureter stent   . Hydronephrosis, right   . Neuromuscular disorder (HCC)    legs numb intermittently  . 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[place]   . Pneumonia   . Right ureteral stone   . Urosepsis 8/14   [facility]    SURGICAL HISTORY: Past Surgical History:  Procedure Laterality Date  . CYSTOSCOPY W/ URETERAL STENT PLACEMENT Right 09/25/2012    Procedure: CYSTOSCOPY WITH RETROGRADE PYELOGRAM/URETERAL STENT PLACEMENT;  Surgeon: TAlexis Frock MD;  Location: [facility];  Service: Urology;  Laterality: Right;  . CYSTOSCOPY WITH RETROGRADE PYELOGRAM, URETEROSCOPY AND STENT PLACEMENT Right 10/15/2012   Procedure: CYSTOSCOPY WITH RETROGRADE PYELOGRAM, URETEROSCOPY AND REMOVAL STENT WITH  STENT PLACEMENT;  Surgeon: TAlexis Frock MD;  [facility]  Service: [facility]rology;  Laterality: Right;  . ESOPHAGOGASTRODUODENOSCOPY (EGD) WITH PROPOFOL N/A 11/16/2014   Procedure: ESOPHAGOGASTRODUODENOSCOPY (EGD) WITH PROPOFOL;  Surgeon: DMilus Banister MD;  Location: [facility];  Service: Endoscopy;  Laterality: N/A;  . HOLMIUM LASER APPLICATION Right [id number]/4403  Procedure: HOLMIUM LASER APPLICATION;  Surgeon: TAlexis Frock MD;  [facility]  Service: Urology;  Laterality: Right;  . LIVER BIOPSY    . OTHER SURGICAL HISTORY Right    removal of ovarian cyst  . removal of uterine cyst     years ago  . RIGHT VATS W/ DRAINAGE PEURAL EFFUSION AND BX'S  10-30-2008    I have reviewed the social history and family history with the patient and they are unchanged from previous note.  ALLERGIES:  has No Known Allergies.  MEDICATIONS:  Current Outpatient Medications  Medication Sig Dispense Refill  . acyclovir (ZOVIRAX) 800 MG tablet Take 1 tablet (800 mg total) by mouth 2 (two) times daily. 60 tablet 3  . cyclobenzaprine (FLEXERIL) 5 MG tablet Take 1 tablet (5 mg total) by mouth 3 (three) times daily  as needed for muscle spasms. (Patient not taking: Reported on 07/19/2018) 30 tablet 1  . feeding supplement, ENSURE ENLIVE, (ENSURE ENLIVE) LIQD Take 237 mLs by mouth 2 (two) times daily between meals. (Patient not taking: Reported on 07/06/2018) 237 mL 12  . ibuprofen (ADVIL,MOTRIN) 200 MG tablet Take 200 mg by mouth every 6 (six) hours as needed for moderate pain.    . magic mouthwash SOLN Take 5 mLs by mouth 4 (four)  times daily as needed for mouth pain. (Patient not taking: Reported on 07/06/2018) 240 mL 3  . omeprazole (PRILOSEC) 20 MG capsule Take 1 capsule (20 mg total) by mouth daily. 30 capsule 5  . ondansetron (ZOFRAN ODT) 8 MG disintegrating tablet Take 1 tablet (8 mg total) by mouth every 8 (eight) hours as needed for nausea or vomiting. (Patient not taking: Reported on 07/06/2018) 40 tablet 1  . polyethylene glycol (MIRALAX / GLYCOLAX) packet Take 17 g by mouth daily as needed (constipation). (Patient not taking: Reported on 07/06/2018) 14 each 1  . pomalidomide (POMALYST) 2 MG capsule Take 1 capsule (2 mg total) by mouth daily. Take with water on days 1-21. Repeat every 28 days. 21 capsule 0  . prochlorperazine (COMPAZINE) 10 MG tablet Take 1 tablet (10 mg total) by mouth every 6 (six) hours as needed for nausea or vomiting. (Patient not taking: Reported on 07/06/2018) 30 tablet 3  . promethazine (PHENERGAN) 25 MG tablet Take 1 tablet (25 mg total) by mouth every 6 (six) hours as needed for nausea or vomiting. (Patient not taking: Reported on 07/06/2018) 30 tablet 0  . simethicone (MYLICON) 80 MG chewable tablet 1 to 2 PO QID prn abdominal bloating (Patient not taking: Reported on 07/06/2018) 100 tablet 2  . traMADol (ULTRAM) 50 MG tablet Take 1 tablet (50 mg total) by mouth every 6 (six) hours as needed. (Patient not taking: Reported on 08/02/2018) 30 tablet 0  . VIREAD 300 MG tablet Take 1 tablet (300 mg total) by mouth daily. 30 tablet 11   No current facility-administered medications for this visit.    Facility-Administered Medications Ordered in Other Visits  Medication Dose Route Frequency Provider Last Rate Last Dose  . sodium chloride 0.9 % injection 10 mL  10 mL Intravenous PRN Truitt Merle, MD   10 mL at 12/11/15 1532  . sodium chloride 0.9 % injection 10 mL  10 mL Intravenous PRN Truitt Merle, MD   10 mL at 06/11/16 1505  . sodium chloride 0.9 % injection 10 mL  10 mL Intravenous PRN Truitt Merle, MD   10 mL at  08/04/17 1643  . sodium chloride flush (NS) 0.9 % injection 10 mL  10 mL Intravenous PRN Truitt Merle, MD   10 mL at 10/09/15 1717    PHYSICAL EXAMINATION: ECOG PERFORMANCE STATUS: 1 - Symptomatic but completely ambulatory  Vitals:   08/16/18 1319  BP: 105/68  Pulse: 73  Resp: 18  Temp: 98.3 F (36.8 C)  SpO2: 100%   Filed Weights   08/16/18 1319  Weight: 145 lb 11.2 oz (66.1 kg)    GENERAL:alert, no distress and comfortable SKIN: No obvious rash EYES:  sclera clear LUNGS: Respirations even and nonlabored HEART: no lower extremity edema Musculoskeletal:no cyanosis of digits NEURO: alert & oriented x 3 with fluent speech, normal gait PAC without erythema Limited exam for COVID-19 outbreak  LABORATORY DATA:  I have reviewed the data as listed CBC Latest Ref Rng & Units 08/16/2018 08/02/2018 07/19/2018  WBC 4.0 -  10.5 K/uL 4.0 5.3 4.3  Hemoglobin 12.0 - 15.0 g/dL 10.6(L) 10.7(L) 11.2(L)  Hematocrit 36.0 - 46.0 % 34.4(L) 34.3(L) 35.9(L)  Platelets 150 - 400 K/uL 253 218 297     CMP Latest Ref Rng & Units 08/02/2018 07/19/2018 07/06/2018  Glucose 70 - 99 mg/dL 91 126(H) 99  BUN 6 - 20 mg/dL _0 Creatinine 0.44 - 1.00 mg/dL 0.74 0.74 0.81  Sodium 135 - 145 mmol/L 141 142 142  Potassium 3.5 - 5.1 mmol/L 4.1 3.8 3.5  Chloride 98 - 111 mmol/L 107 108 107  CO2 22 - 32 mmol/L _1 Calcium 8.9 - 10.3 mg/dL 8.9 9.6 9.5  Total Protein 6.5 - 8.1 g/dL 6.9 7.2 7.5  Total Bilirubin 0.3 - 1.2 mg/dL 0.2(L) 0.3 0.2(L)  Alkaline Phos 38 - 126 U/L 90 69 92  AST 15 - 41 U/L _2 ALT 0 - 44 U/L 7 7 <6      RADIOGRAPHIC STUDIES: I have personally reviewed the radiological images as listed and agreed with the findings in the report. No results found.   ASSESSMENT & PLAN: Sheryl [name] a 48 y.o.femalewith   1. 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 -She was diagnosed in 10/2014. She was treated with CyborD and bone marrow transplant.  She progressed on maintenance Velcade in 02/2016. She was treated with more Dara and CyborD, which was stopped after a severe Hep B flare. -She is currently being treatedwith Carfilzomib and Pomalyst.She is tolerating well overall. -Shewas offPomalyst for 2-3 monthsfor insurance/refill issueand restarted Pomalystin 03/2018. [facility] to pick [facility]. -[facility]. Dr. Norma Fredrickson reviewed her biopsy and considered her to be in remission.This was previously reviewed by Dr. Burr Medico -M protein has been negative, light chains WNL; Dr. Burr Medico previously reviewed her leukemia is still in remission. Labs pending today -she continues pomalyst 3 weeks on/1 week off and kyprolis q2 weeks  -she was recently seen by Dr. Tiana Loft at [facility], the note is not available.   2.Cervical radiculopathy -Her MRI Spine from 03/09/18 showedmild cervical disk bulging, but overall benign. This is likely nerve related. -she wasevaluated by neurologist Dr. Mickeal Skinner in our cancer center, and received a 5 days of steroids, much improved now. -she has generalized body aches on treatment, not limiting function   3.Chronichepatitis B infection -Shehas knownchronichepatitis B infection,developed significant transaminitis and hyperbilirubinemiainJanuary 2019 -followed by ID Dr. Baxter Flattery -on Viread 300 mg daily  4. Type 2 diabetes, steroids induced -Previouslytreated with Metformin.No longer ondexamethasone. -resolved  5. GERD, history of GI bleeding and nausea -Colonoscopy on 11/26/16 per Dr. Ardis Hughs notable for a polyp in the transverse colon, revealed to be tubular adenoma and negative for high grade dysplasia or malignancy.  -continues PPI daily  -denies nausea   Dispo: Ms. Redditt appears stable.  She continues Pomalyst 3 weeks on/1 week off and Kyprolis every 2 weeks.  She tolerates moderately well with fatigue,  weakness, and generalized body aches.  She has recovered well today.  Labs reviewed, CBC and CMP adequate for treatment.  M protein and light chains are pending. Will request note from her oncologist at [facility].  She will proceed with next cycle Kyprolis today.  We will follow-up on her Pomalyst refill and she will begin next cycle as soon as she gets supply. F/u in 2 weeks with next cycle.  All questions were answered.  The patient knows to call the clinic with any problems, questions or concerns. No barriers to learning was detected.     Alla Feeling, NP 08/16/18

## 2018-08-16 ENCOUNTER — Inpatient Hospital Stay: Payer: Medicaid Other

## 2018-08-16 ENCOUNTER — Other Ambulatory Visit: Payer: Self-pay

## 2018-08-16 ENCOUNTER — Telehealth: Payer: Self-pay | Admitting: Nurse Practitioner

## 2018-08-16 ENCOUNTER — Encounter: Payer: Self-pay | Admitting: Nurse Practitioner

## 2018-08-16 ENCOUNTER — Inpatient Hospital Stay (HOSPITAL_BASED_OUTPATIENT_CLINIC_OR_DEPARTMENT_OTHER): Payer: Medicaid Other | Admitting: Nurse Practitioner

## 2018-08-16 ENCOUNTER — Inpatient Hospital Stay: Payer: Medicaid Other | Attending: Hematology

## 2018-08-16 VITALS — BP 105/68 | HR 73 | Temp 98.3°F | Resp 18 | Ht 62.0 in | Wt 145.7 lb

## 2018-08-16 DIAGNOSIS — E099 Drug or chemical induced diabetes mellitus without complications: Secondary | ICD-10-CM | POA: Insufficient documentation

## 2018-08-16 DIAGNOSIS — C9012 Plasma cell leukemia in relapse: Secondary | ICD-10-CM | POA: Insufficient documentation

## 2018-08-16 DIAGNOSIS — C9011 Plasma cell leukemia in remission: Secondary | ICD-10-CM

## 2018-08-16 DIAGNOSIS — B181 Chronic viral hepatitis B without delta-agent: Secondary | ICD-10-CM | POA: Insufficient documentation

## 2018-08-16 DIAGNOSIS — Z79899 Other long term (current) drug therapy: Secondary | ICD-10-CM | POA: Insufficient documentation

## 2018-08-16 DIAGNOSIS — K219 Gastro-esophageal reflux disease without esophagitis: Secondary | ICD-10-CM | POA: Diagnosis not present

## 2018-08-16 DIAGNOSIS — Z95828 Presence of other vascular implants and grafts: Secondary | ICD-10-CM

## 2018-08-16 DIAGNOSIS — Z9481 Bone marrow transplant status: Secondary | ICD-10-CM | POA: Diagnosis not present

## 2018-08-16 DIAGNOSIS — Z5112 Encounter for antineoplastic immunotherapy: Secondary | ICD-10-CM | POA: Insufficient documentation

## 2018-08-16 DIAGNOSIS — C9010 Plasma cell leukemia not having achieved remission: Secondary | ICD-10-CM

## 2018-08-16 LAB — CMP (CANCER CENTER ONLY)
ALT: 6 U/L (ref 0–44)
AST: 21 U/L (ref 15–41)
Albumin: 3.2 g/dL — ABNORMAL LOW (ref 3.5–5.0)
Alkaline Phosphatase: 77 U/L (ref 38–126)
Anion gap: 6 (ref 5–15)
BUN: 20 mg/dL (ref 6–20)
CO2: 27 mmol/L (ref 22–32)
Calcium: 8.9 mg/dL (ref 8.9–10.3)
Chloride: 107 mmol/L (ref 98–111)
Creatinine: 0.77 mg/dL (ref 0.44–1.00)
GFR, Est AFR Am: >60 mL/min (ref >60)
GFR, Estimated: >60 mL/min (ref >60)
Glucose, Bld: 105 mg/dL — ABNORMAL HIGH (ref 70–99)
Potassium: 3.8 mmol/L (ref 3.5–5.1)
Sodium: 140 mmol/L (ref 135–145)
Total Bilirubin: 0.3 mg/dL (ref 0.3–1.2)
Total Protein: 7 g/dL (ref 6.5–8.1)

## 2018-08-16 LAB — CBC WITH DIFFERENTIAL (CANCER CENTER ONLY)
Abs Immature Granulocytes: 0.01 10*3/uL (ref 0.00–0.07)
Basophils Absolute: 0 10*3/uL (ref 0.0–0.1)
Basophils Relative: 1 %
Eosinophils Absolute: 0 10*3/uL (ref 0.0–0.5)
Eosinophils Relative: 1 %
HCT: 34.4 % — ABNORMAL LOW (ref 36.0–46.0)
Hemoglobin: 10.6 g/dL — ABNORMAL LOW (ref 12.0–15.0)
Immature Granulocytes: 0 %
Lymphocytes Relative: 31 %
Lymphs Abs: 1.2 10*3/uL (ref 0.7–4.0)
MCH: 29 pg (ref 26.0–34.0)
MCHC: 30.8 g/dL (ref 30.0–36.0)
MCV: 94 fL (ref 80.0–100.0)
Monocytes Absolute: 0.7 10*3/uL (ref 0.1–1.0)
Monocytes Relative: 16 %
Neutro Abs: 2.1 10*3/uL (ref 1.7–7.7)
Neutrophils Relative %: 51 %
Platelet Count: 253 10*3/uL (ref 150–400)
RBC: 3.66 MIL/uL — ABNORMAL LOW (ref 3.87–5.11)
RDW: 13.8 % (ref 11.5–15.5)
WBC Count: 4 10*3/uL (ref 4.0–10.5)
nRBC: 0 % (ref 0.0–0.2)

## 2018-08-16 MED ORDER — PROCHLORPERAZINE MALEATE 10 MG PO TABS
ORAL_TABLET | ORAL | Status: AC
  Filled 2018-08-16: qty 1

## 2018-08-16 MED ORDER — SODIUM CHLORIDE 0.9% FLUSH
10.0000 mL | INTRAVENOUS | Status: DC | PRN
  Administered 2018-08-16: 16:00:00 10 mL
  Filled 2018-08-16: qty 10

## 2018-08-16 MED ORDER — SODIUM CHLORIDE 0.9% FLUSH
10.0000 mL | Freq: Once | INTRAVENOUS | Status: AC
  Administered 2018-08-16: 10 mL
  Filled 2018-08-16: qty 10

## 2018-08-16 MED ORDER — PALONOSETRON HCL INJECTION 0.25 MG/5ML
0.2500 mg | Freq: Once | INTRAVENOUS | Status: AC
  Administered 2018-08-16: 0.25 mg via INTRAVENOUS

## 2018-08-16 MED ORDER — SODIUM CHLORIDE 0.9 % IV SOLN
Freq: Once | INTRAVENOUS | Status: DC
  Filled 2018-08-16: qty 250

## 2018-08-16 MED ORDER — DEXTROSE 5 % IV SOLN
56.0000 mg/m2 | Freq: Once | INTRAVENOUS | Status: AC
  Administered 2018-08-16: 15:00:00 90 mg via INTRAVENOUS
  Filled 2018-08-16: qty 30

## 2018-08-16 MED ORDER — DEXAMETHASONE SODIUM PHOSPHATE 10 MG/ML IJ SOLN
INTRAMUSCULAR | Status: AC
  Filled 2018-08-16: qty 1

## 2018-08-16 MED ORDER — DEXAMETHASONE SODIUM PHOSPHATE 10 MG/ML IJ SOLN
8.0000 mg | Freq: Once | INTRAMUSCULAR | Status: AC
  Administered 2018-08-16: 8 mg via INTRAVENOUS

## 2018-08-16 MED ORDER — PROCHLORPERAZINE MALEATE 10 MG PO TABS
10.0000 mg | ORAL_TABLET | Freq: Once | ORAL | Status: AC
  Administered 2018-08-16: 10 mg via ORAL

## 2018-08-16 MED ORDER — HEPARIN SOD (PORK) LOCK FLUSH 100 UNIT/ML IV SOLN
500.0000 [IU] | Freq: Once | INTRAVENOUS | Status: AC | PRN
  Administered 2018-08-16: 500 [IU]
  Filled 2018-08-16: qty 5

## 2018-08-16 MED ORDER — SODIUM CHLORIDE 0.9 % IV SOLN
Freq: Once | INTRAVENOUS | Status: AC
  Administered 2018-08-16: 14:00:00 via INTRAVENOUS
  Filled 2018-08-16: qty 250

## 2018-08-16 MED FILL — OMEPRAZOLE 20 MG CPDR: 20 | 30 days supply | Qty: 30 | Fill #1

## 2018-08-16 MED FILL — TENOFOVIR DISOPROXIL FUMARA: 300 | 30 days supply | Qty: 30 | Fill #6

## 2018-08-16 MED FILL — MI-ACID GAS 80 MG TAB CHEW: 80 | 12 days supply | Qty: 100 | Fill #1

## 2018-08-16 NOTE — Patient Instructions (Signed)
[facility] Discharge Instructions for Patients Receiving Chemotherapy  Today you received the following chemotherapy agents:  Kyprolis  To help prevent nausea and vomiting after your treatment, we encourage you to take your nausea medication as directed   If you develop nausea and vomiting that is not controlled by your nausea medication, call the clinic.   BELOW ARE SYMPTOMS THAT SHOULD BE REPORTED IMMEDIATELY:  *FEVER GREATER THAN 100.5 F  *CHILLS WITH OR WITHOUT FEVER  NAUSEA AND VOMITING THAT IS NOT CONTROLLED WITH YOUR NAUSEA MEDICATION  *UNUSUAL SHORTNESS OF BREATH  *UNUSUAL BRUISING OR BLEEDING  TENDERNESS IN MOUTH AND THROAT WITH OR WITHOUT PRESENCE OF ULCERS  *URINARY PROBLEMS  *BOWEL PROBLEMS  UNUSUAL RASH Items with * indicate a potential emergency and should be followed up as soon as possible.  Feel free to call the clinic should you have any questions or concerns. The clinic phone number is [phone].  Please show the CHEMO ALERT CARD at check-in to the Emergency Department and triage nurse.   Coronavirus (COVID-19) Are you at risk?  Are you at risk for the Coronavirus (COVID-19)?  To be considered HIGH RISK for Coronavirus (COVID-19), you have to meet the following criteria:  . Traveled to [place], [place], [place], [place] or [place]; or in the [place] to [place], [place], [place], or [place]; and have fever, cough, and shortness of breath within the last 2 weeks of travel OR . Been in close contact with a person diagnosed with COVID-19 within the last 2 weeks and have fever, cough, and shortness of breath . IF YOU DO NOT MEET THESE CRITERIA, YOU ARE CONSIDERED LOW RISK FOR COVID-19.  What to do if you are HIGH RISK for COVID-19?  . If you are having a medical emergency, call 911. . Seek medical care right away. Before you go to a doctor's office, urgent care or emergency department, call ahead and tell them about your  recent travel, contact with someone diagnosed with COVID-19, and your symptoms. You should receive instructions from your physician's office regarding next steps of care.  . When you arrive at healthcare provider, tell the healthcare staff immediately you have returned from visiting [place], [place], [place], [place] or [place]; or traveled in the [place] to [place], [place], [place], or [place]; in the last two weeks or you have been in close contact with a person diagnosed with COVID-19 in the last 2 weeks.   . Tell the health care staff about your symptoms: fever, cough and shortness of breath. . After you have been seen by a medical provider, you will be either: o Tested for (COVID-19) and discharged home on quarantine except to seek medical care if symptoms worsen, and asked to  - Stay home and avoid contact with others until you get your results (4-5 days)  - Avoid travel on public transportation if possible (such as bus, train, or airplane) or o Sent to the Emergency Department by EMS for evaluation, COVID-19 testing, and possible admission depending on your condition and test results.  What to do if you are LOW RISK for COVID-19?  Reduce your risk of any infection by using the same precautions used for avoiding the common cold or flu:  . Wash your hands often with soap and warm water for at least 20 seconds.  If soap and water are not readily available, use an alcohol-based hand sanitizer with at least 60% alcohol.  . If coughing   or sneezing, cover your mouth and nose by coughing or sneezing into the elbow areas of your shirt or coat, into a tissue or into your sleeve (not your hands). . Avoid shaking hands with others and consider head nods or verbal greetings only. . Avoid touching your eyes, nose, or mouth with unwashed hands.  . Avoid close contact with people who are sick. . Avoid places or events with large numbers of people in one location, like concerts or sporting  events. . Carefully consider travel plans you have or are making. . If you are planning any travel outside or inside the [place], visit the CDC's Travelers' Health webpage for the latest health notices. . If you have some symptoms but not all symptoms, continue to monitor at home and seek medical attention if your symptoms worsen. . If you are having a medical emergency, call 911.   ADDITIONAL HEALTHCARE OPTIONS FOR PATIENTS  [place] [facility] / e-Visit: [email or web address]         [facility]: [phone]  [facility]: [phone]                   [facility]: [phone]   

## 2018-08-16 NOTE — Telephone Encounter (Signed)
Scheduled appt per 7/13 los. °

## 2018-08-17 ENCOUNTER — Other Ambulatory Visit: Payer: Medicaid Other

## 2018-08-17 ENCOUNTER — Ambulatory Visit: Payer: Medicaid Other | Admitting: Nurse Practitioner

## 2018-08-17 LAB — KAPPA/LAMBDA LIGHT CHAINS
Kappa free light chain: 43.2 mg/L — ABNORMAL HIGH (ref 3.3–19.4)
Kappa, lambda light chain ratio: 1.03 (ref 0.26–1.65)
Lambda free light chains: 42 mg/L — ABNORMAL HIGH (ref 5.7–26.3)

## 2018-08-17 LAB — MULTIPLE MYELOMA PANEL, SERUM
Albumin SerPl Elph-Mcnc: 3.1 g/dL (ref 2.9–4.4)
Albumin/Glob SerPl: 1.1 (ref 0.7–1.7)
Alpha 1: 0.2 g/dL (ref 0.0–0.4)
Alpha2 Glob SerPl Elph-Mcnc: 0.8 g/dL (ref 0.4–1.0)
B-Globulin SerPl Elph-Mcnc: 0.9 g/dL (ref 0.7–1.3)
Gamma Glob SerPl Elph-Mcnc: 1.2 g/dL (ref 0.4–1.8)
Globulin, Total: 3.1 g/dL (ref 2.2–3.9)
IgA: 236 mg/dL (ref 87–352)
IgG (Immunoglobin G), Serum: 1323 mg/dL (ref 586–1602)
IgM (Immunoglobulin M), Srm: 40 mg/dL (ref 26–217)
Total Protein ELP: 6.2 g/dL (ref 6.0–8.5)

## 2018-08-20 ENCOUNTER — Telehealth: Payer: Self-pay

## 2018-08-20 NOTE — Telephone Encounter (Signed)
Called patient's daughter to let her know her Pomalyst is ready to be picked up.  (I have locked it in the cabinet in POD 1)

## 2018-08-30 ENCOUNTER — Inpatient Hospital Stay: Payer: Medicaid Other

## 2018-08-30 ENCOUNTER — Other Ambulatory Visit: Payer: Self-pay

## 2018-08-30 ENCOUNTER — Telehealth: Payer: Self-pay | Admitting: Nurse Practitioner

## 2018-08-30 ENCOUNTER — Inpatient Hospital Stay (HOSPITAL_BASED_OUTPATIENT_CLINIC_OR_DEPARTMENT_OTHER): Payer: Medicaid Other | Admitting: Nurse Practitioner

## 2018-08-30 ENCOUNTER — Ambulatory Visit: Payer: Medicaid Other

## 2018-08-30 ENCOUNTER — Encounter: Payer: Self-pay | Admitting: Nurse Practitioner

## 2018-08-30 VITALS — BP 112/74 | HR 77 | Temp 98.3°F | Resp 18 | Ht 62.0 in | Wt 146.9 lb

## 2018-08-30 DIAGNOSIS — Z5112 Encounter for antineoplastic immunotherapy: Secondary | ICD-10-CM | POA: Diagnosis not present

## 2018-08-30 DIAGNOSIS — K219 Gastro-esophageal reflux disease without esophagitis: Secondary | ICD-10-CM

## 2018-08-30 DIAGNOSIS — B181 Chronic viral hepatitis B without delta-agent: Secondary | ICD-10-CM

## 2018-08-30 DIAGNOSIS — C9010 Plasma cell leukemia not having achieved remission: Secondary | ICD-10-CM

## 2018-08-30 DIAGNOSIS — Z95828 Presence of other vascular implants and grafts: Secondary | ICD-10-CM

## 2018-08-30 DIAGNOSIS — M5412 Radiculopathy, cervical region: Secondary | ICD-10-CM | POA: Diagnosis not present

## 2018-08-30 DIAGNOSIS — C9011 Plasma cell leukemia in remission: Secondary | ICD-10-CM | POA: Diagnosis not present

## 2018-08-30 DIAGNOSIS — Z79899 Other long term (current) drug therapy: Secondary | ICD-10-CM

## 2018-08-30 DIAGNOSIS — E099 Drug or chemical induced diabetes mellitus without complications: Secondary | ICD-10-CM | POA: Diagnosis not present

## 2018-08-30 DIAGNOSIS — C9012 Plasma cell leukemia in relapse: Secondary | ICD-10-CM

## 2018-08-30 LAB — CMP (CANCER CENTER ONLY)
ALT: <6 U/L (ref 0–44)
AST: 22 U/L (ref 15–41)
Albumin: 3.3 g/dL — ABNORMAL LOW (ref 3.5–5.0)
Alkaline Phosphatase: 94 U/L (ref 38–126)
Anion gap: 6 (ref 5–15)
BUN: 21 mg/dL — ABNORMAL HIGH (ref 6–20)
CO2: 27 mmol/L (ref 22–32)
Calcium: 9.3 mg/dL (ref 8.9–10.3)
Chloride: 108 mmol/L (ref 98–111)
Creatinine: 0.75 mg/dL (ref 0.44–1.00)
GFR, Est AFR Am: >60 mL/min (ref >60)
GFR, Estimated: >60 mL/min (ref >60)
Glucose, Bld: 88 mg/dL (ref 70–99)
Potassium: 3.9 mmol/L (ref 3.5–5.1)
Sodium: 141 mmol/L (ref 135–145)
Total Bilirubin: 0.2 mg/dL — ABNORMAL LOW (ref 0.3–1.2)
Total Protein: 6.8 g/dL (ref 6.5–8.1)

## 2018-08-30 LAB — CBC WITH DIFFERENTIAL (CANCER CENTER ONLY)
Abs Immature Granulocytes: 0.05 10*3/uL (ref 0.00–0.07)
Basophils Absolute: 0 10*3/uL (ref 0.0–0.1)
Basophils Relative: 1 %
Eosinophils Absolute: 0.1 10*3/uL (ref 0.0–0.5)
Eosinophils Relative: 2 %
HCT: 33.9 % — ABNORMAL LOW (ref 36.0–46.0)
Hemoglobin: 10.5 g/dL — ABNORMAL LOW (ref 12.0–15.0)
Immature Granulocytes: 1 %
Lymphocytes Relative: 14 %
Lymphs Abs: 0.8 10*3/uL (ref 0.7–4.0)
MCH: 29 pg (ref 26.0–34.0)
MCHC: 31 g/dL (ref 30.0–36.0)
MCV: 93.6 fL (ref 80.0–100.0)
Monocytes Absolute: 0.4 10*3/uL (ref 0.1–1.0)
Monocytes Relative: 7 %
Neutro Abs: 4.3 10*3/uL (ref 1.7–7.7)
Neutrophils Relative %: 75 %
Platelet Count: 294 10*3/uL (ref 150–400)
RBC: 3.62 MIL/uL — ABNORMAL LOW (ref 3.87–5.11)
RDW: 13.9 % (ref 11.5–15.5)
WBC Count: 5.6 10*3/uL (ref 4.0–10.5)
nRBC: 0 % (ref 0.0–0.2)

## 2018-08-30 LAB — PREGNANCY, URINE: Preg Test, Ur: NEGATIVE

## 2018-08-30 MED ORDER — HEPARIN SOD (PORK) LOCK FLUSH 100 UNIT/ML IV SOLN
500.0000 [IU] | Freq: Once | INTRAVENOUS | Status: AC | PRN
  Administered 2018-08-30: 500 [IU]
  Filled 2018-08-30: qty 5

## 2018-08-30 MED ORDER — SODIUM CHLORIDE 0.9 % IV SOLN
Freq: Once | INTRAVENOUS | Status: AC
  Administered 2018-08-30: 13:00:00 via INTRAVENOUS
  Filled 2018-08-30: qty 250

## 2018-08-30 MED ORDER — PROCHLORPERAZINE MALEATE 10 MG PO TABS
ORAL_TABLET | ORAL | Status: AC
  Filled 2018-08-30: qty 1

## 2018-08-30 MED ORDER — DEXAMETHASONE SODIUM PHOSPHATE 10 MG/ML IJ SOLN
INTRAMUSCULAR | Status: AC
  Filled 2018-08-30: qty 1

## 2018-08-30 MED ORDER — SODIUM CHLORIDE 0.9% FLUSH
10.0000 mL | INTRAVENOUS | Status: DC | PRN
  Administered 2018-08-30: 10 mL
  Filled 2018-08-30: qty 10

## 2018-08-30 MED ORDER — DEXTROSE 5 % IV SOLN
56.0000 mg/m2 | Freq: Once | INTRAVENOUS | Status: AC
  Administered 2018-08-30: 90 mg via INTRAVENOUS
  Filled 2018-08-30: qty 30

## 2018-08-30 MED ORDER — SODIUM CHLORIDE 0.9% FLUSH
10.0000 mL | Freq: Once | INTRAVENOUS | Status: AC
  Administered 2018-08-30: 10 mL
  Filled 2018-08-30: qty 10

## 2018-08-30 MED ORDER — PALONOSETRON HCL INJECTION 0.25 MG/5ML
0.2500 mg | Freq: Once | INTRAVENOUS | Status: AC
  Administered 2018-08-30: 0.25 mg via INTRAVENOUS

## 2018-08-30 MED ORDER — DEXAMETHASONE SODIUM PHOSPHATE 10 MG/ML IJ SOLN
8.0000 mg | Freq: Once | INTRAMUSCULAR | Status: AC
  Administered 2018-08-30: 13:00:00 8 mg via INTRAVENOUS

## 2018-08-30 MED ORDER — PALONOSETRON HCL INJECTION 0.25 MG/5ML
INTRAVENOUS | Status: AC
  Filled 2018-08-30: qty 5

## 2018-08-30 MED ORDER — PROCHLORPERAZINE MALEATE 10 MG PO TABS
10.0000 mg | ORAL_TABLET | Freq: Once | ORAL | Status: AC
  Administered 2018-08-30: 10 mg via ORAL

## 2018-08-30 NOTE — Patient Instructions (Signed)
Cancer Center Discharge Instructions for Patients Receiving Chemotherapy  Today you received the following chemotherapy agents:  Kyprolis  To help prevent nausea and vomiting after your treatment, we encourage you to take your nausea medication as directed   If you develop nausea and vomiting that is not controlled by your nausea medication, call the clinic.   BELOW ARE SYMPTOMS THAT SHOULD BE REPORTED IMMEDIATELY:  *FEVER GREATER THAN 100.5 F  *CHILLS WITH OR WITHOUT FEVER  NAUSEA AND VOMITING THAT IS NOT CONTROLLED WITH YOUR NAUSEA MEDICATION  *UNUSUAL SHORTNESS OF BREATH  *UNUSUAL BRUISING OR BLEEDING  TENDERNESS IN MOUTH AND THROAT WITH OR WITHOUT PRESENCE OF ULCERS  *URINARY PROBLEMS  *BOWEL PROBLEMS  UNUSUAL RASH Items with * indicate a potential emergency and should be followed up as soon as possible.  Feel free to call the clinic should you have any questions or concerns. The clinic phone number is [phone].  Please show the CHEMO ALERT CARD at check-in to the Emergency Department and triage nurse.   Coronavirus (COVID-19) Are you at risk?  Are you at risk for the Coronavirus (COVID-19)?  To be considered HIGH RISK for Coronavirus (COVID-19), you have to meet the following criteria:  . Traveled to [place], [place], [place], [place] or [place]; or in the [place] to [place], [place], [place], or [place]; and have fever, cough, and shortness of breath within the last 2 weeks of travel OR . Been in close contact with a person diagnosed with COVID-19 within the last 2 weeks and have fever, cough, and shortness of breath . IF YOU DO NOT MEET THESE CRITERIA, YOU ARE CONSIDERED LOW RISK FOR COVID-19.  What to do if you are HIGH RISK for COVID-19?  . If you are having a medical emergency, call 911. . Seek medical care right away. Before you go to a doctor's office, urgent care or emergency department, call ahead and tell them about your  recent travel, contact with someone diagnosed with COVID-19, and your symptoms. You should receive instructions from your physician's office regarding next steps of care.  . When you arrive at healthcare provider, tell the healthcare staff immediately you have returned from visiting [place], [place], [place], [place] or [place]; or traveled in the [place] to [place], [place], [place], or [place]; in the last two weeks or you have been in close contact with a person diagnosed with COVID-19 in the last 2 weeks.   . Tell the health care staff about your symptoms: fever, cough and shortness of breath. . After you have been seen by a medical provider, you will be either: o Tested for (COVID-19) and discharged home on quarantine except to seek medical care if symptoms worsen, and asked to  - Stay home and avoid contact with others until you get your results (4-5 days)  - Avoid travel on public transportation if possible (such as bus, train, or airplane) or o Sent to the Emergency Department by EMS for evaluation, COVID-19 testing, and possible admission depending on your condition and test results.  What to do if you are LOW RISK for COVID-19?  Reduce your risk of any infection by using the same precautions used for avoiding the common cold or flu:  . Wash your hands often with soap and warm water for at least 20 seconds.  If soap and water are not readily available, use an alcohol-based hand sanitizer with at least 60% alcohol.  . If coughing   or sneezing, cover your mouth and nose by coughing or sneezing into the elbow areas of your shirt or coat, into a tissue or into your sleeve (not your hands). . Avoid shaking hands with others and consider head nods or verbal greetings only. . Avoid touching your eyes, nose, or mouth with unwashed hands.  . Avoid close contact with people who are sick. . Avoid places or events with large numbers of people in one location, like concerts or sporting  events. . Carefully consider travel plans you have or are making. . If you are planning any travel outside or inside the [place], visit the CDC's Travelers' Health webpage for the latest health notices. . If you have some symptoms but not all symptoms, continue to monitor at home and seek medical attention if your symptoms worsen. . If you are having a medical emergency, call 911.   ADDITIONAL HEALTHCARE OPTIONS FOR PATIENTS  [place] [facility] / e-Visit: [email or web address]         [facility]: [phone]  [facility]: [phone]                   [facility]: [phone]   

## 2018-08-30 NOTE — Progress Notes (Signed)
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   10/10/2014 Miscellaneous   Peripheral blood chemistry and leukocytosis with total white count 78K, comprised of large plasma cells and his normocytic anemia. There is a myeloid left shift with previous surgical radium blasts. Flow cytometry showed 64% plasma cells   10/10/2014 Bone Marrow Biopsy   Markedly hypercellular marrow (95%), Atypical plasma cells comprise 57% of the cellularity. There was diminished multilineage in hematopoiesis with adequate maturation. Breasts less than 1%), no overt dysplasia of the myeloid or erythroid lineages.    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   05/07/2015 - 05/12/2015 Hospital Admission   [facility], [facility]. ID workup was negative, EGD  showed evidence of gastritis and duodenitis, no H. pylori or CMV.   05/20/2015 - 05/24/2015 Hospital Admission   [facility]   07/11/2015 Bone Marrow Biopsy   Post transplant 100 a bone marrow biopsy showed hypocellular marrow, 20%, no increase in plasma cells (2%) or other abnormalities.   08/22/2015 - 01/02/2016 Chemotherapy   MaintenanceCyborD (cytoxan 3022mm2 iv, bortezomib 1.5 mg/m, dexamethasone 40 mg, every 2 weeks, changed to Velcade maintenance after 4 months treatment   01/16/2016 - 04/19/2016 Chemotherapy   Maintenance Velcade 1.3 mg/m every 2 weeks   02/22/2016 Pathology Results   BONE MARROW: Diagnosis Bone Marrow, Aspirate,Biopsy, and Clot, right iliac - NORMOCELLULAR BONE MARROW FOR AGE WITH TRILINEAGE HEMATOPOIESIS. - PLASMACYTOSIS (PLASMA CELLS 12%). - SEE COMMENT. PERIPHERAL BLOOD: - OCCASIONAL CIRCULATING PLASMA CELLS.   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    04/18/2016 - 03/26/2017 Chemotherapy   Daratumumab per protocol  CyBorD every week, cytoxan was held after 04/24/2016 dye to cytopenia and infection  -discontinued due to Hep B flare   05/11/2016 - 05/16/2016 Hospital Admission   Healthcare-associated pneumonia   02/16/2017 - 02/21/2017 Hospital Admission   Admission diagnosis: Abnormal LFT's Additional comments: Assoc diagnoses: Hep B flare, transminitis, dehydration, fever, SIRS   04/06/2017 -  Chemotherapy   Carfilzomib 56 mg/m2 on days 1 and 15(except 2041m2 on C1D1 and C1D2)with dexamethasone 20 mg on same days and pomalidomide 2 mg on days 1-21 of 28 day cycleon 04/13/17, dexa stopped on 07/06/2017 due to her CR  -She restarted her Pomalyst in 03/2018 (off for 2-3 months due to insurance, delivery issues)   08/26/2017 - 08/28/2017 Hospital Admission   Admit date: 08/26/2017 Admission  diagnosis: RLL Pneumonia    04/19/2018 Bone Marrow Biopsy    Bone Marrow Biopsy 04/19/18 at [facility] Bone Marrow (BM) and Peripheral Blood (PB) FINAL PATHOLOGIC DIAGNOSIS BONE MARROW: Hypocellular marrow (20%) with trilineage hematopoiesis. No [plasma cell myeloma identified. See comment. PERIPHERAL BLOOD: Mild anemia. No circulating plasma cells seen. See CBC data and comment.      CURRENT THERAPY: Carfilzomib 56 mg/m2 on days 1 and 15(except 70m/m2 on C1D1 and C1D2)with dexamethasone 20 mg on same days and pomalidomide 2 mg on days 1-21 of 28 day cycleon 04/13/17, dexa stopped on 07/06/2017 due to her CR -Sherestarted herPomalystin 03/2018(off for 2-3 months due to insurance, delivery issues)  INTERVAL HISTORY: Ms. HNoreenreturns for f/u and treatment as scheduled. She completed cycle 17 day 1 on 08/16/2018. She continues pomalyst, she started current cycle on 7/21. She feels fatigued and achy on pomalyst. She has pain in her neck which is also exacerbated by her work as a nEngineer, civil (consulting) constantly looking down. She has nausea after chemo, controlled with medication. Denies vomiting, constipation, diarrhea. She has intermittent chills but no fever. Denies mucositis, appetite is normal. Denies cough, chest pain, dyspnea, leg swelling, neuropathy, or abdominal pain.     MEDICAL HISTORY:  Past Medical History:  Diagnosis Date  . Chills with fever    intermittently since d/c from hospital  . Dysuria-frequency syndrome    w/ pink urine  . GERD (gastroesophageal reflux disease)   . Hepatitis   . History of positive PPD    DX 2011--  CXR DONE NO EVIDENCE  . History of ureter stent   . Hydronephrosis, right   . Neuromuscular disorder (H[facility])    legs numb intermittently  . 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   . Pneumonia   . Right ureteral stone   . Urosepsis 8/14   ad[facility]    SURGICAL HISTORY: Past Surgical History:  Procedure Laterality Date  . CYSTOSCOPY W/ URETERAL STENT PLACEMENT Right 09/25/2012    Procedure: CYSTOSCOPY WITH RETROGRADE PYELOGRAM/URETERAL STENT PLACEMENT;  Surgeon: TAlexis Frock MD;  Location: [facility];  Service: Urology;  Laterality: Right;  . CYSTOSCOPY WITH RETROGRADE PYELOGRAM, URETEROSCOPY AND STENT PLACEMENT Right 10/15/2012   Procedure: CYSTOSCOPY WITH RETROGRADE PYELOGRAM, URETEROSCOPY AND REMOVAL STENT WITH  STENT PLACEMENT;  Surgeon: TAlexis Frock MD;  [facility]  Service: Urology;  Laterality: Right;  . ESOPHAGOGASTRODUODENOSCOPY (EGD) WITH PROPOFOL N/A 11/16/2014   Procedure: ESOPHAGOGASTRODUODENOSCOPY (EGD) WITH PROPOFOL;  Surgeon: DMilus Banister MD;  Location: [facility];  Service: Endoscopy;  Laterality: N/A;  . HOLMIUM LASER APPLICATION Right 91/15/7262  Procedure: HOLMIUM LASER APPLICATION;  Surgeon: TAlexis Frock MD;  [facility]  Service: Urology;  Laterality: Right;  . LIVER BIOPSY    . OTHER SURGICAL HISTORY Right    removal of ovarian cyst  . removal of uterine cyst     years ago  . RIGHT VATS W/ DRAINAGE PEURAL EFFUSION AND BX'S  10-30-2008    I have reviewed the social history and family history with the patient and they are unchanged from previous note.  ALLERGIES:  has No Known Allergies.  MEDICATIONS:  Current Outpatient Medications  Medication Sig Dispense Refill  . acyclovir (ZOVIRAX) 800 MG tablet Take 1 tablet (800 mg total) by mouth 2 (two) times daily. 60 tablet 3  . cyclobenzaprine (FLEXERIL) 5 MG tablet Take 1 tablet (5 mg total) by mouth 3 (three) times daily as needed for muscle spasms. 30 tablet  1  . feeding supplement, ENSURE ENLIVE, (ENSURE ENLIVE) LIQD Take 237 mLs by mouth 2 (two) times daily between meals. 237 mL 12  . ibuprofen (ADVIL,MOTRIN) 200 MG tablet Take 200 mg by mouth every 6 (six) hours as needed for moderate pain.    . magic mouthwash SOLN Take 5 mLs by mouth 4 (four) times daily as needed for mouth pain. 240 mL 3  . omeprazole (PRILOSEC) 20 MG capsule  Take 1 capsule (20 mg total) by mouth daily. 30 capsule 5  . ondansetron (ZOFRAN ODT) 8 MG disintegrating tablet Take 1 tablet (8 mg total) by mouth every 8 (eight) hours as needed for nausea or vomiting. 40 tablet 1  . polyethylene glycol (MIRALAX / GLYCOLAX) packet Take 17 g by mouth daily as needed (constipation). 14 each 1  . pomalidomide (POMALYST) 2 MG capsule Take 1 capsule (2 mg total) by mouth daily. Take with water on days 1-21. Repeat every 28 days. 21 capsule 0  . prochlorperazine (COMPAZINE) 10 MG tablet Take 1 tablet (10 mg total) by mouth every 6 (six) hours as needed for nausea or vomiting. 30 tablet 3  . promethazine (PHENERGAN) 25 MG tablet Take 1 tablet (25 mg total) by mouth every 6 (six) hours as needed for nausea or vomiting. 30 tablet 0  . simethicone (MYLICON) 80 MG chewable tablet 1 to 2 PO QID prn abdominal bloating 100 tablet 2  . traMADol (ULTRAM) 50 MG tablet Take 1 tablet (50 mg total) by mouth every 6 (six) hours as needed. 30 tablet 0  . VIREAD 300 MG tablet Take 1 tablet (300 mg total) by mouth daily. 30 tablet 11   No current facility-administered medications for this visit.    Facility-Administered Medications Ordered in Other Visits  Medication Dose Route Frequency Provider Last Rate Last Dose  . sodium chloride 0.9 % injection 10 mL  10 mL Intravenous PRN Truitt Merle, MD   10 mL at 12/11/15 1532  . sodium chloride 0.9 % injection 10 mL  10 mL Intravenous PRN Truitt Merle, MD   10 mL at 06/11/16 1505  . sodium chloride 0.9 % injection 10 mL  10 mL Intravenous PRN Truitt Merle, MD   10 mL at 08/04/17 1643  . sodium chloride flush (NS) 0.9 % injection 10 mL  10 mL Intravenous PRN Truitt Merle, MD   10 mL at 10/09/15 1717    PHYSICAL EXAMINATION: ECOG PERFORMANCE STATUS: 1 - Symptomatic but completely ambulatory  Vitals:   08/30/18 1108  BP: 112/74  Pulse: 77  Resp: 18  Temp: 98.3 F (36.8 C)  SpO2: 100%   Filed Weights   08/30/18 1108  Weight: 146 lb 14.4 oz  (66.6 kg)    GENERAL:alert, no distress and comfortable SKIN: no rash  EYES: sclera clear LUNGS: clear to auscultation with normal breathing effort HEART: regular rate & rhythm; no lower extremity edema ABDOMEN:abdomen soft, non-tender and normal bowel sounds Musculoskeletal:no cyanosis of digits  NEURO: alert & oriented x 3 with fluent speech, normal gait PAC without erythema   LABORATORY DATA:  I have reviewed the data as listed CBC Latest Ref Rng & Units 08/30/2018 08/16/2018 08/02/2018  WBC 4.0 - 10.5 K/uL 5.6 4.0 5.3  Hemoglobin 12.0 - 15.0 g/dL 10.5(L) 10.6(L) 10.7(L)  Hematocrit 36.0 - 46.0 % 33.9(L) 34.4(L) 34.3(L)  Platelets 150 - 400 K/uL 294 253 218     CMP Latest Ref Rng & Units 08/30/2018 08/16/2018 08/02/2018  Glucose 70 - 99 mg/dL  88 105(H) 91  BUN 6 - 20 mg/dL 21(H) 20 18  Creatinine 0.44 - 1.00 mg/dL 0.75 0.77 0.74  Sodium 135 - 145 mmol/L 141 140 141  Potassium 3.5 - 5.1 mmol/L 3.9 3.8 4.1  Chloride 98 - 111 mmol/L 108 107 107  CO2 22 - 32 mmol/L _0 Calcium 8.9 - 10.3 mg/dL 9.3 8.9 8.9  Total Protein 6.5 - 8.1 g/dL 6.8 7.0 6.9  Total Bilirubin 0.3 - 1.2 mg/dL 0.2(L) 0.3 0.2(L)  Alkaline Phos 38 - 126 U/L 94 77 90  AST 15 - 41 U/L _1 ALT 0 - 44 U/L <_2 RADIOGRAPHIC STUDIES: I have personally reviewed the radiological images as listed and agreed with the findings in the report. No results found.   ASSESSMENT & PLAN: Sheryl [name] [id number]   1. 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 -She was diagnosed in 10/2014. She was treated with CyborD and bone marrow transplant. She progressed on maintenance Velcade in 02/2016. She was treated with more Dara and CyborD, which was stopped after a severe Hep B flare. -She is currently being treatedwith Carfilzomib and Pomalyst.She is tolerating well overall. -Shewas offPomalyst for 2-3 monthsfor insurance/refill issueand restarted Pomalystin  03/2018. Continue to ship her medication to [facility] for her to pick up. -[facility]. Dr. Norma Fredrickson reviewed her biopsy and considered her to be in remission.This was previously reviewed by Dr. Burr Medico -M protein has been negative, light chains WNL; her leukemia is still in remission -she continues pomalyst 3 weeks on/1 week off and kyprolis on days 1 and 15 -she was recently seen by [facility] Tiana Loft at [facility], the note is not available.   2.Cervical radiculopathy -Her MRI Spine from 03/09/18 showedmild cervical disk bulging, but overall benign. This is likely nerve related. -she wasevaluated by neurologist Dr. Mickeal Skinner in our cancer center, and received a 5 days of steroids, much improved now. -she has generalized body aches on treatment, more in her neck recently especially at work. I encouraged her to use heat and tylenol PRN.   3.Chronichepatitis B infection -Shehas knownchronichepatitis B infection,developed significant transaminitis and hyperbilirubinemiainJanuary 2019 -followed by ID Dr. Baxter Flattery -on Viread 300 mg daily  4. Type 2 diabetes, steroids induced -Previouslytreated with Metformin.No longer ondexamethasone. -resolved  5. GERD, history of GI bleeding and nausea -Colonoscopy on 11/26/16 per Dr. Ardis Hughs notable for a polyp in the transverse colon, revealed to be tubular adenoma and negative for high grade dysplasia or malignancy.  -continues PPI daily  -she reports intermittent nausea after infusion, managed well with oral anti-emetics  Dispo:  Ms. Espejo appears well. She continues carfilzomib on days 1 and 15 and pomalyst 3 weeks on and 1 week off. She is one week into current pomalyst cycle. She tolerates treatment moderately well with fatigue, body aches, and intermittent nausea. She utilizes anti-emetics. I encouraged her to use heat PRN and stretch her neck/body especially during working hours. Labs  reviewed, CBC and CMP stable, adequate for treatment. On 08/15/08 M protein remains undetected. She will proceed with cycle 7 day 15 carfilzomib today and continue pomalyst for 2 more weeks, then off for 1 week. Refilled today. She will return for f/u with Dr. Burr Medico and next cycle in 2 weeks.   All questions were answered. The patient knows to call the clinic with any problems, questions or concerns. No barriers to learning was detected.  Alla Feeling, NP 08/30/18

## 2018-08-30 NOTE — Patient Instructions (Signed)

## 2018-08-30 NOTE — Telephone Encounter (Signed)
Scheduled appt per 7/27 los.

## 2018-08-31 ENCOUNTER — Other Ambulatory Visit: Payer: Self-pay

## 2018-08-31 DIAGNOSIS — C9012 Plasma cell leukemia in relapse: Secondary | ICD-10-CM

## 2018-09-02 ENCOUNTER — Telehealth: Payer: Self-pay

## 2018-09-02 ENCOUNTER — Other Ambulatory Visit: Payer: Self-pay

## 2018-09-02 DIAGNOSIS — C9012 Plasma cell leukemia in relapse: Secondary | ICD-10-CM

## 2018-09-02 MED ORDER — POMALIDOMIDE 2 MG PO CAPS: 2.0000 mg | ORAL_CAPSULE | Freq: Every day | ORAL | 0 refills | Status: DC

## 2018-09-02 NOTE — Telephone Encounter (Signed)
Faxed new script for [employer] and [place] [phone] to [facility] fax [phone], sent to HIM for scan to chart.

## 2018-09-06 ENCOUNTER — Telehealth: Payer: Self-pay

## 2018-09-06 NOTE — Telephone Encounter (Signed)
Attempted to arrange shipment for Pomalyst from [employer] and they need to know how many pills she has left.  I left a message for her daughter Sheryl [name] to find out and call me back so shipment can take place.

## 2018-09-09 ENCOUNTER — Other Ambulatory Visit: Payer: Self-pay

## 2018-09-09 DIAGNOSIS — C9012 Plasma cell leukemia in relapse: Secondary | ICD-10-CM

## 2018-09-09 NOTE — Progress Notes (Signed)
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   10/10/2014 Miscellaneous   Peripheral blood chemistry and leukocytosis with total white count 78K, comprised of large plasma cells and his normocytic anemia. There is a myeloid left shift with previous surgical radium blasts. Flow cytometry showed 64% plasma cells   10/10/2014 Bone Marrow Biopsy   Markedly hypercellular marrow (95%), Atypical plasma cells comprise 57% of the cellularity. There was diminished multilineage in hematopoiesis with adequate maturation. Breasts less than 1%), no overt dysplasia of the myeloid or erythroid lineages.    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   05/07/2015 - 05/12/2015 Hospital Admission   [facility], [facility] ID  workup was negative, EGD showed evidence of gastritis and duodenitis, no H. pylori or CMV.   05/20/2015 - 05/24/2015 Hospital Admission   [facility]   07/11/2015 Bone Marrow Biopsy   Post transplant 100 a bone marrow biopsy showed hypocellular marrow, 20%, no increase in plasma cells (2%) or other abnormalities.   08/22/2015 - 01/02/2016 Chemotherapy   MaintenanceCyborD (cytoxan 3051mm2 iv, bortezomib 1.5 mg/m, dexamethasone 40 mg, every 2 weeks, changed to Velcade maintenance after 4 months treatment   01/16/2016 - 04/19/2016 Chemotherapy   Maintenance Velcade 1.3 mg/m every 2 weeks   02/22/2016 Pathology Results   BONE MARROW: Diagnosis Bone Marrow, Aspirate,Biopsy, and Clot, right iliac - NORMOCELLULAR BONE MARROW FOR AGE WITH TRILINEAGE HEMATOPOIESIS. - PLASMACYTOSIS (PLASMA CELLS 12%). - SEE COMMENT. PERIPHERAL BLOOD: - OCCASIONAL CIRCULATING PLASMA CELLS.   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    04/18/2016 - 03/26/2017 Chemotherapy   Daratumumab per protocol  CyBorD every week, cytoxan was held after 04/24/2016 dye to cytopenia and infection  -discontinued due to Hep B flare   05/11/2016 - 05/16/2016 Hospital Admission   Healthcare-associated pneumonia   02/16/2017 - 02/21/2017 Hospital Admission   Admission diagnosis: Abnormal LFT's Additional comments: Assoc diagnoses: Hep B flare, transminitis, dehydration, fever, SIRS   04/06/2017 -  Chemotherapy   Carfilzomib 56 mg/m2 on days 1 and 15(except 2059m2 on C1D1 and C1D2)with dexamethasone 20 mg on same days and pomalidomide 2 mg on days 1-21 of 28 day cycleon 04/13/17, dexa stopped on 07/06/2017 due to her CR  -She restarted her Pomalyst in 03/2018 (off for 2-3 months due to insurance, delivery issues)   08/26/2017 - 08/28/2017 Hospital Admission  Admit date: 08/26/2017 Admission diagnosis: RLL Pneumonia     04/19/2018 Bone Marrow Biopsy   Bone Marrow Biopsy 04/19/18 at [facility] Bone Marrow (BM) and Peripheral Blood (PB) FINAL PATHOLOGIC DIAGNOSIS BONE MARROW: Hypocellular marrow (20%) with trilineage hematopoiesis. No [plasma cell myeloma identified. See comment. PERIPHERAL BLOOD: Mild anemia. No circulating plasma cells seen. See CBC data and comment.       CURRENT THERAPY:  Carfilzomib 56 mg/m2 on days 1 and 15(except 41m/m2 on C1D1 and C1D2)with dexamethasone 20 mg on same days and pomalidomide 2 mg on days 1-21 of 28 day cycleon 04/13/17, dexa stopped on 07/06/2017 due to her CR -Sherestarted herPomalystin 03/2018(off for 2-3 months due to insurance, delivery issues)  INTERVAL HISTORY:  Sheryl [name] is here for a follow up with her interpretor. She notes she has been feeling tired from poor energy and she will have to take deep breathes. She still works 3 days a week for 8 hour shifts. She is able to still work. At home she tries to do housework. When she sits down to relax she feels really fatigued. She denies fever, but get chills bumps especially at night. She denies cough or phlegm production. She had normal urination and BMs. Her appetite is fair and she makes sure she eats. She is nervous about bone marrow transplant options given the risks. [facility] mentioned donor being her daughter and be done late this year.     REVIEW OF SYSTEMS:   Constitutional: Denies fevers, chills (+) fair appetite, weight stable (+) fatigue  Eyes: Denies blurriness of vision Ears, nose, mouth, throat, and face: Denies mucositis or sore throat Respiratory: Denies cough, dyspnea or wheezes Cardiovascular: Denies palpitation, chest discomfort or lower extremity swelling Gastrointestinal:  Denies nausea, heartburn or change in bowel habits Skin: Denies abnormal skin rashes Lymphatics: Denies new lymphadenopathy or easy bruising  Neurological:Denies numbness, tingling or new weaknesses Behavioral/Psych: Mood is stable, no new changes  All other systems were reviewed with the patient and are negative.  MEDICAL HISTORY:  Past Medical History:  Diagnosis Date  . Chills with fever    intermittently since d/c from hospital  . Dysuria-frequency syndrome    w/ pink urine  . GERD (gastroesophageal reflux disease)   . Hepatitis   . History of positive PPD    DX 2011--  CXR DONE NO EVIDENCE  . History of ureter stent   . Hydronephrosis, right   . Neuromuscular disorder (HCC)    legs numb intermittently  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   . Pneumonia   . Right ureteral stone   . Urosepsis 8/14   ad[facility]    SURGICAL HISTORY: Past Surgical History:  Procedure Laterality Date  . CYSTOSCOPY W/ URETERAL STENT PLACEMENT Right 09/25/2012   Procedure: CYSTOSCOPY WITH RETROGRADE PYELOGRAM/URETERAL STENT PLACEMENT;  Surgeon: TAlexis Frock MD;  Location: [facility];  Service: Urology;  Laterality: Right;  . CYSTOSCOPY WITH RETROGRADE PYELOGRAM, URETEROSCOPY AND STENT PLACEMENT Right 10/15/2012   Procedure: CYSTOSCOPY WITH RETROGRADE PYELOGRAM, URETEROSCOPY AND REMOVAL STENT WITH  STENT PLACEMENT;  Surgeon: TAlexis Frock MD;  [facility]  Service: Urology;  Laterality: Right;  . ESOPHAGOGASTRODUODENOSCOPY (EGD) WITH PROPOFOL N/A 11/16/2014   Procedure: ESOPHAGOGASTRODUODENOSCOPY (EGD) WITH PROPOFOL;  Surgeon: DMilus Banister MD;  Location: [facility];  Service: Endoscopy;  Laterality: N/A;  . HOLMIUM LASER APPLICATION Right 90/10/3816  Procedure: HOLMIUM LASER APPLICATION;  Surgeon: TAlexis Frock MD;  Location: [place];  Service: Urology;  Laterality: Right;  . LIVER BIOPSY    . OTHER SURGICAL HISTORY Right    removal of ovarian cyst  . removal of uterine cyst     years ago  . RIGHT VATS W/ DRAINAGE PEURAL EFFUSION AND BX'S  10-30-2008    I have reviewed the social  history and family history with the patient and they are unchanged from previous note.  ALLERGIES:  has No Known Allergies.  MEDICATIONS:  Current Outpatient Medications  Medication Sig Dispense Refill  . acyclovir (ZOVIRAX) 800 MG tablet Take 1 tablet (800 mg total) by mouth 2 (two) times daily. 60 tablet 3  . feeding supplement, ENSURE ENLIVE, (ENSURE ENLIVE) LIQD Take 237 mLs by mouth 2 (two) times daily between meals. 237 mL 12  . omeprazole (PRILOSEC) 20 MG capsule Take 1 capsule (20 mg total) by mouth daily. 30 capsule 5  . VIREAD 300 MG tablet Take 1 tablet (300 mg total) by mouth daily. 30 tablet 11  . ibuprofen (ADVIL,MOTRIN) 200 MG tablet Take 200 mg by mouth every 6 (six) hours as needed for moderate pain.    . magic mouthwash SOLN Take 5 mLs by mouth 4 (four) times daily as needed for mouth pain. (Patient not taking: Reported on 09/13/2018) 240 mL 3  . ondansetron (ZOFRAN ODT) 8 MG disintegrating tablet Take 1 tablet (8 mg total) by mouth every 8 (eight) hours as needed for nausea or vomiting. (Patient not taking: Reported on 09/13/2018) 40 tablet 1  . polyethylene glycol (MIRALAX / GLYCOLAX) packet Take 17 g by mouth daily as needed (constipation). (Patient not taking: Reported on 09/13/2018) 14 each 1  . pomalidomide (POMALYST) 2 MG capsule Take 1 capsule (2 mg total) by mouth daily. Take with water on days 1-21. Repeat every 28 days. 21 capsule 0  . prochlorperazine (COMPAZINE) 10 MG tablet Take 1 tablet (10 mg total) by mouth every 6 (six) hours as needed for nausea or vomiting. (Patient not taking: Reported on 09/13/2018) 30 tablet 3  . promethazine (PHENERGAN) 25 MG tablet Take 1 tablet (25 mg total) by mouth every 6 (six) hours as needed for nausea or vomiting. (Patient not taking: Reported on 09/13/2018) 30 tablet 0  . simethicone (MYLICON) 80 MG chewable tablet 1 to 2 PO QID prn abdominal bloating (Patient not taking: Reported on 09/13/2018) 100 tablet 2  . traMADol (ULTRAM) 50 MG  tablet Take 1 tablet (50 mg total) by mouth every 6 (six) hours as needed. (Patient not taking: Reported on 09/13/2018) 30 tablet 0   No current facility-administered medications for this visit.    Facility-Administered Medications Ordered in Other Visits  Medication Dose Route Frequency Provider Last Rate Last Dose  . sodium chloride 0.9 % injection 10 mL  10 mL Intravenous PRN Truitt Merle, MD   10 mL at 12/11/15 1532  . sodium chloride 0.9 % injection 10 mL  10 mL Intravenous PRN Truitt Merle, MD   10 mL at 06/11/16 1505  . sodium chloride 0.9 % injection 10 mL  10 mL Intravenous PRN Truitt Merle, MD   10 mL at 08/04/17 1643  . sodium chloride flush (NS) 0.9 % injection 10 mL  10 mL Intravenous PRN Truitt Merle, MD   10 mL at 10/09/15 1717    PHYSICAL EXAMINATION: ECOG PERFORMANCE STATUS: 1 - Symptomatic but completely ambulatory  Vitals:   09/13/18 1021  BP: 120/77  Pulse: 73  Resp: 17  Temp:  98.3 F (36.8 C)  SpO2: 100%   Filed Weights   09/13/18 1021  Weight: 145 lb 3.2 oz (65.9 kg)    GENERAL:alert, no distress and comfortable SKIN: skin color, texture, turgor are normal, no rashes or significant lesions EYES: normal, Conjunctiva are pink and non-injected, sclera clear  NECK: supple, thyroid normal size, non-tender, without nodularity LYMPH:  no palpable lymphadenopathy in the cervical, axillary  LUNGS: clear to auscultation and percussion with normal breathing effort HEART: regular rate & rhythm and no murmurs and no lower extremity edema ABDOMEN:abdomen soft, non-tender and normal bowel sounds Musculoskeletal:no cyanosis of digits and no clubbing  NEURO: alert & oriented x 3 with fluent speech, no focal motor/sensory deficits  LABORATORY DATA:  I have reviewed the data as listed CBC Latest Ref Rng & Units 09/13/2018 08/30/2018 08/16/2018  WBC 4.0 - 10.5 K/uL 3.8(L) 5.6 4.0  Hemoglobin 12.0 - 15.0 g/dL 10.8(L) 10.5(L) 10.6(L)  Hematocrit 36.0 - 46.0 % 34.6(L) 33.9(L) 34.4(L)   Platelets 150 - 400 K/uL 182 294 253     CMP Latest Ref Rng & Units 09/13/2018 08/30/2018 08/16/2018  Glucose 70 - 99 mg/dL 113(H) 88 105(H)  BUN 6 - 20 mg/dL 18 21(H) 20  Creatinine 0.44 - 1.00 mg/dL 0.83 0.75 0.77  Sodium 135 - 145 mmol/L 141 141 140  Potassium 3.5 - 5.1 mmol/L 3.8 3.9 3.8  Chloride 98 - 111 mmol/L 106 108 107  CO2 22 - 32 mmol/L _0 Calcium 8.9 - 10.3 mg/dL 9.6 9.3 8.9  Total Protein 6.5 - 8.1 g/dL 6.9 6.8 7.0  Total Bilirubin 0.3 - 1.2 mg/dL 0.3 0.2(L) 0.3  Alkaline Phos 38 - 126 U/L 77 94 77  AST 15 - 41 U/L _1 ALT 0 - 44 U/L 8 <6 6      RADIOGRAPHIC STUDIES: I have personally reviewed the radiological images as listed and agreed with the findings in the report. No results found.   ASSESSMENT & PLAN:  Sheryl [name] is a 48 y.o. female with   1. 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 -She was diagnosed in 10/2014. She was treated with CyborD and bone marrow transplant. She progressed on maintenance Velcade in 02/2016. She was treated with more Dara and CyborD, which was stopped after a severe Hep B flare. -She is currently being treatedwith Carfilzomib and Pomalyst.She is tolerating well overall. -Sherun out Pomalyst for 2-3 months and restarted Pomalystin 03/2018. Continue to ship her medication to [facility] for her to pick up.She was off for 3 weeks due to pancytopenia, labs improved lately.  -I discussed with her [facility]. Dr. Norma Fredrickson reviewed her biopsy and considered her to be in remission. She is currently waiting on donor availability for bone marrow transplant.  -She recently saw transplant specialist Dr. Nadara Mustard. She has concern about her risk and overall benefits of procedure. At this point she has declined the transplant for now and would rather continue current treatment. -She is clinically stable except recent increase in fatigue and nightly  chills. Labs reviewed, CBC and CMP WNL except WBC 3.8, Hg 10.8. MM panel still pending. Overall adequate to proceed withIVCarfilzomibtoday.  -Continue Pomalyst. She is off this week.  -I discussed her fatigue is likely from prolonged chemo treatment. I encouraged her to eat well and exercise more in her down times to help her fatigue.  -F/u in 6 weeks with me.   2.Cervical radiculopathy -Ongoingsince  late 12/2017 butgot worse in early 2020 -She denies history of neck trauma or injury -Her MRI Spine from 03/09/18 showedmild cervical disk bulging, but overall benign. This is likely nerve related. -she wasevaluated by neurologist Dr. Mickeal Skinner in our cancer center, and received a 5 days of steroids, much improved now.Off pain meds or gabapentin   3.Chronichepatitis B infection -Shehas knownchronichepatitis B infection,developed significant transaminitis and hyperbilirubinemiainJanuary 2019 -Sherestartedher Heb Btreatmentin 02/2017, controlled now -She will continue to be closely followed by ID Dr. Baxter Flattery   4. Type 2 diabetes, steroids induced -Previouslytreated with Metformin.No longer ondexamethasone. -Has much improvedand stable. Better controlled lately.  5. GERD, history of GI bleeding and nausea -Colonoscopy on 11/26/16 per Dr. Ardis Hughs notable for a polyp in the transverse colon, revealed to be tubular adenoma and negative for high grade dysplasia or malignancy.  -Continue omeprazole daily. We previously discussed steroids can worsen her acid reflux -Nauseacurrently resolved or minimal.   Plan -Labs reviewed and adequate to proceed with IVCarfilzomibtoday -Continue Pomalyst  -Lab, flush, f/u with Lacie and treatment in2 and 4 weeks  -Lab, flush, F/u with me and treatment in 6 weeks    No problem-specific Assessment & Plan notes found for this encounter.   No orders of the defined types were placed in this encounter.  All questions were  answered. The patient knows to call the clinic with any problems, questions or concerns. No barriers to learning was detected. I spent 20 minutes counseling the patient face to face. The total time spent in the appointment was 25 minutes and more than 50% was on counseling and review of test results     Truitt Merle, MD 09/13/2018   I, Joslyn Devon, am acting as scribe for Truitt Merle, MD.   I have reviewed the above documentation for accuracy and completeness, and I agree with the above.

## 2018-09-13 ENCOUNTER — Inpatient Hospital Stay: Payer: Medicaid Other | Attending: Hematology

## 2018-09-13 ENCOUNTER — Encounter: Payer: Self-pay | Admitting: Hematology

## 2018-09-13 ENCOUNTER — Other Ambulatory Visit: Payer: Self-pay

## 2018-09-13 ENCOUNTER — Telehealth: Payer: Self-pay | Admitting: Hematology

## 2018-09-13 ENCOUNTER — Inpatient Hospital Stay: Payer: Medicaid Other

## 2018-09-13 ENCOUNTER — Inpatient Hospital Stay (HOSPITAL_BASED_OUTPATIENT_CLINIC_OR_DEPARTMENT_OTHER): Payer: Medicaid Other | Admitting: Hematology

## 2018-09-13 VITALS — BP 120/77 | HR 73 | Temp 98.3°F | Resp 17 | Ht 62.0 in | Wt 145.2 lb

## 2018-09-13 DIAGNOSIS — C9011 Plasma cell leukemia in remission: Secondary | ICD-10-CM | POA: Insufficient documentation

## 2018-09-13 DIAGNOSIS — C9012 Plasma cell leukemia in relapse: Secondary | ICD-10-CM

## 2018-09-13 DIAGNOSIS — B181 Chronic viral hepatitis B without delta-agent: Secondary | ICD-10-CM | POA: Diagnosis not present

## 2018-09-13 DIAGNOSIS — D649 Anemia, unspecified: Secondary | ICD-10-CM

## 2018-09-13 DIAGNOSIS — Z95828 Presence of other vascular implants and grafts: Secondary | ICD-10-CM

## 2018-09-13 DIAGNOSIS — Z5112 Encounter for antineoplastic immunotherapy: Secondary | ICD-10-CM | POA: Diagnosis present

## 2018-09-13 DIAGNOSIS — C9010 Plasma cell leukemia not having achieved remission: Secondary | ICD-10-CM

## 2018-09-13 LAB — CBC WITH DIFFERENTIAL (CANCER CENTER ONLY)
Abs Immature Granulocytes: 0.01 10*3/uL (ref 0.00–0.07)
Basophils Absolute: 0 10*3/uL (ref 0.0–0.1)
Basophils Relative: 1 %
Eosinophils Absolute: 0.1 10*3/uL (ref 0.0–0.5)
Eosinophils Relative: 4 %
HCT: 34.6 % — ABNORMAL LOW (ref 36.0–46.0)
Hemoglobin: 10.8 g/dL — ABNORMAL LOW (ref 12.0–15.0)
Immature Granulocytes: 0 %
Lymphocytes Relative: 27 %
Lymphs Abs: 1 10*3/uL (ref 0.7–4.0)
MCH: 28.9 pg (ref 26.0–34.0)
MCHC: 31.2 g/dL (ref 30.0–36.0)
MCV: 92.5 fL (ref 80.0–100.0)
Monocytes Absolute: 0.7 10*3/uL (ref 0.1–1.0)
Monocytes Relative: 18 %
Neutro Abs: 1.9 10*3/uL (ref 1.7–7.7)
Neutrophils Relative %: 50 %
Platelet Count: 182 10*3/uL (ref 150–400)
RBC: 3.74 MIL/uL — ABNORMAL LOW (ref 3.87–5.11)
RDW: 13.5 % (ref 11.5–15.5)
WBC Count: 3.8 10*3/uL — ABNORMAL LOW (ref 4.0–10.5)
nRBC: 0 % (ref 0.0–0.2)

## 2018-09-13 LAB — CMP (CANCER CENTER ONLY)
ALT: 8 U/L (ref 0–44)
AST: 24 U/L (ref 15–41)
Albumin: 3.3 g/dL — ABNORMAL LOW (ref 3.5–5.0)
Alkaline Phosphatase: 77 U/L (ref 38–126)
Anion gap: 11 (ref 5–15)
BUN: 18 mg/dL (ref 6–20)
CO2: 24 mmol/L (ref 22–32)
Calcium: 9.6 mg/dL (ref 8.9–10.3)
Chloride: 106 mmol/L (ref 98–111)
Creatinine: 0.83 mg/dL (ref 0.44–1.00)
GFR, Est AFR Am: >60 mL/min (ref >60)
GFR, Estimated: >60 mL/min (ref >60)
Glucose, Bld: 113 mg/dL — ABNORMAL HIGH (ref 70–99)
Potassium: 3.8 mmol/L (ref 3.5–5.1)
Sodium: 141 mmol/L (ref 135–145)
Total Bilirubin: 0.3 mg/dL (ref 0.3–1.2)
Total Protein: 6.9 g/dL (ref 6.5–8.1)

## 2018-09-13 LAB — PREGNANCY, URINE: Preg Test, Ur: NEGATIVE

## 2018-09-13 MED ORDER — POMALIDOMIDE 2 MG PO CAPS: 2.0000 mg | ORAL_CAPSULE | Freq: Every day | ORAL | 0 refills | Status: DC

## 2018-09-13 MED ORDER — SODIUM CHLORIDE 0.9% FLUSH
10.0000 mL | Freq: Once | INTRAVENOUS | Status: AC
  Administered 2018-09-13: 10 mL
  Filled 2018-09-13: qty 10

## 2018-09-13 MED ORDER — SODIUM CHLORIDE 0.9% FLUSH
10.0000 mL | INTRAVENOUS | Status: DC | PRN
  Administered 2018-09-13: 10 mL
  Filled 2018-09-13: qty 10

## 2018-09-13 MED ORDER — HEPARIN SOD (PORK) LOCK FLUSH 100 UNIT/ML IV SOLN
500.0000 [IU] | Freq: Once | INTRAVENOUS | Status: AC | PRN
  Administered 2018-09-13: 500 [IU]
  Filled 2018-09-13: qty 5

## 2018-09-13 MED ORDER — PROCHLORPERAZINE MALEATE 10 MG PO TABS
ORAL_TABLET | ORAL | Status: AC
  Filled 2018-09-13: qty 1

## 2018-09-13 MED ORDER — SODIUM CHLORIDE 0.9 % IV SOLN
Freq: Once | INTRAVENOUS | Status: AC
  Administered 2018-09-13: 12:00:00 via INTRAVENOUS
  Filled 2018-09-13: qty 250

## 2018-09-13 MED ORDER — DEXAMETHASONE SODIUM PHOSPHATE 10 MG/ML IJ SOLN
8.0000 mg | Freq: Once | INTRAMUSCULAR | Status: AC
  Administered 2018-09-13: 11:00:00 8 mg via INTRAVENOUS

## 2018-09-13 MED ORDER — PROCHLORPERAZINE MALEATE 10 MG PO TABS
10.0000 mg | ORAL_TABLET | Freq: Once | ORAL | Status: AC
  Administered 2018-09-13: 10 mg via ORAL

## 2018-09-13 MED ORDER — DEXTROSE 5 % IV SOLN
56.0000 mg/m2 | Freq: Once | INTRAVENOUS | Status: AC
  Administered 2018-09-13: 12:00:00 90 mg via INTRAVENOUS
  Filled 2018-09-13: qty 15

## 2018-09-13 MED ORDER — DEXAMETHASONE SODIUM PHOSPHATE 10 MG/ML IJ SOLN
INTRAMUSCULAR | Status: AC
  Filled 2018-09-13: qty 1

## 2018-09-13 MED ORDER — PALONOSETRON HCL INJECTION 0.25 MG/5ML
INTRAVENOUS | Status: AC
  Filled 2018-09-13: qty 5

## 2018-09-13 MED ORDER — PALONOSETRON HCL INJECTION 0.25 MG/5ML
0.2500 mg | Freq: Once | INTRAVENOUS | Status: AC
  Administered 2018-09-13: 0.25 mg via INTRAVENOUS

## 2018-09-13 MED FILL — ACYCLOVIR 800 MG TABLET: 800 | 30 days supply | Qty: 60 | Fill #1

## 2018-09-13 MED FILL — TENOFOVIR DISOPROXIL FUMARA: 300 | 30 days supply | Qty: 30 | Fill #7

## 2018-09-13 MED FILL — OMEPRAZOLE 20 MG CAP: 20 | 30 days supply | Qty: 30 | Fill #2

## 2018-09-13 NOTE — Progress Notes (Signed)
Faxed script for [employer] and [employer] authorization [phone] to [facility] by [facility], sent to HIM for scan to chart.

## 2018-09-13 NOTE — Patient Instructions (Signed)
[facility] Discharge Instructions for Patients Receiving Chemotherapy  Today you received the following chemotherapy agents:  Kyprolis  To help prevent nausea and vomiting after your treatment, we encourage you to take your nausea medication as directed.   If you develop nausea and vomiting that is not controlled by your nausea medication, call the clinic.   BELOW ARE SYMPTOMS THAT SHOULD BE REPORTED IMMEDIATELY:  *FEVER GREATER THAN 100.5 F  *CHILLS WITH OR WITHOUT FEVER  NAUSEA AND VOMITING THAT IS NOT CONTROLLED WITH YOUR NAUSEA MEDICATION  *UNUSUAL SHORTNESS OF BREATH  *UNUSUAL BRUISING OR BLEEDING  TENDERNESS IN MOUTH AND THROAT WITH OR WITHOUT PRESENCE OF ULCERS  *URINARY PROBLEMS  *BOWEL PROBLEMS  UNUSUAL RASH Items with * indicate a potential emergency and should be followed up as soon as possible.  Feel free to call the clinic should you have any questions or concerns. The clinic phone number is [phone].  Please show the CHEMO ALERT CARD at check-in to the Emergency Department and triage nurse.   

## 2018-09-13 NOTE — Telephone Encounter (Signed)
Scheduled appt per 8/10 LOS - pt awre of appts added - per patient will get a print out in the treatment area.

## 2018-09-14 LAB — KAPPA/LAMBDA LIGHT CHAINS
Kappa free light chain: 55.2 mg/L — ABNORMAL HIGH (ref 3.3–19.4)
Kappa, lambda light chain ratio: 1.03 (ref 0.26–1.65)
Lambda free light chains: 53.7 mg/L — ABNORMAL HIGH (ref 5.7–26.3)

## 2018-09-15 ENCOUNTER — Telehealth: Payer: Self-pay

## 2018-09-15 LAB — MULTIPLE MYELOMA PANEL, SERUM
Albumin SerPl Elph-Mcnc: 3.3 g/dL (ref 2.9–4.4)
Albumin/Glob SerPl: 1.1 (ref 0.7–1.7)
Alpha 1: 0.2 g/dL (ref 0.0–0.4)
Alpha2 Glob SerPl Elph-Mcnc: 0.8 g/dL (ref 0.4–1.0)
B-Globulin SerPl Elph-Mcnc: 0.8 g/dL (ref 0.7–1.3)
Gamma Glob SerPl Elph-Mcnc: 1.3 g/dL (ref 0.4–1.8)
Globulin, Total: 3.1 g/dL (ref 2.2–3.9)
IgA: 234 mg/dL (ref 87–352)
IgG (Immunoglobin G), Serum: 1347 mg/dL (ref 586–1602)
IgM (Immunoglobulin M), Srm: 45 mg/dL (ref 26–217)
Total Protein ELP: 6.4 g/dL (ref 6.0–8.5)

## 2018-09-15 NOTE — Telephone Encounter (Signed)
Left voice message informing patient we have Pomalyst for her to pick up, needs to pick up either tomorrow or Friday so she can restart her medication on time.  Locked in cabinet in POD 1 nursing's desk.

## 2018-09-21 ENCOUNTER — Telehealth: Payer: Self-pay | Admitting: *Deleted

## 2018-09-21 NOTE — Telephone Encounter (Signed)
Pt came to pick up Pomalyst prescription

## 2018-09-26 NOTE — Progress Notes (Signed)
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   10/10/2014 Miscellaneous   Peripheral blood chemistry and leukocytosis with total white count 78K, comprised of large plasma cells and his normocytic anemia. There is a myeloid left shift with previous surgical radium blasts. Flow cytometry showed 64% plasma cells   10/10/2014 Bone Marrow Biopsy   Markedly hypercellular marrow (95%), Atypical plasma cells comprise 57% of the cellularity. There was diminished multilineage in hematopoiesis with adequate maturation. Breasts less than 1%), no overt dysplasia of the myeloid or erythroid lineages.    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   05/07/2015 - 05/12/2015 Hospital Admission   [facility], [facility] ID workup was negative, EGD  showed evidence of gastritis and duodenitis, no H. pylori or CMV.   05/20/2015 - 05/24/2015 Hospital Admission   [facility]   07/11/2015 Bone Marrow Biopsy   Post transplant 100 a bone marrow biopsy showed hypocellular marrow, 20%, no increase in plasma cells (2%) or other abnormalities.   08/22/2015 - 01/02/2016 Chemotherapy   MaintenanceCyborD (cytoxan 3048mm2 iv, bortezomib 1.5 mg/m, dexamethasone 40 mg, every 2 weeks, changed to Velcade maintenance after 4 months treatment   01/16/2016 - 04/19/2016 Chemotherapy   Maintenance Velcade 1.3 mg/m every 2 weeks   02/22/2016 Pathology Results   BONE MARROW: Diagnosis Bone Marrow, Aspirate,Biopsy, and Clot, right iliac - NORMOCELLULAR BONE MARROW FOR AGE WITH TRILINEAGE HEMATOPOIESIS. - PLASMACYTOSIS (PLASMA CELLS 12%). - SEE COMMENT. PERIPHERAL BLOOD: - OCCASIONAL CIRCULATING PLASMA CELLS.   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    04/18/2016 - 03/26/2017 Chemotherapy   Daratumumab per protocol  CyBorD every week, cytoxan was held after 04/24/2016 dye to cytopenia and infection  -discontinued due to Hep B flare   05/11/2016 - 05/16/2016 Hospital Admission   Healthcare-associated pneumonia   02/16/2017 - 02/21/2017 Hospital Admission   Admission diagnosis: Abnormal LFT's Additional comments: Assoc diagnoses: Hep B flare, transminitis, dehydration, fever, SIRS   04/06/2017 -  Chemotherapy   Carfilzomib 56 mg/m2 on days 1 and 15(except 2055m2 on C1D1 and C1D2)with dexamethasone 20 mg on same days and pomalidomide 2 mg on days 1-21 of 28 day cycleon 04/13/17, dexa stopped on 07/06/2017 due to her CR  -She restarted her Pomalyst in 03/2018 (off for 2-3 months due to insurance, delivery issues)   08/26/2017 - 08/28/2017 Hospital Admission   Admit date: 08/26/2017  Admission diagnosis: RLL Pneumonia    04/19/2018 Bone Marrow Biopsy    Bone Marrow Biopsy 04/19/18 [facility] Bone Marrow (BM) and Peripheral Blood (PB) FINAL PATHOLOGIC DIAGNOSIS BONE MARROW: Hypocellular marrow (20%) with trilineage hematopoiesis. No [plasma cell myeloma identified. See comment. PERIPHERAL BLOOD: Mild anemia. No circulating plasma cells seen. See CBC data and comment.      CURRENT THERAPY: Carfilzomib 56 mg/m2 on days 1 and 15(except 78m/m2 on C1D1 and C1D2)with dexamethasone 20 mg on same days and pomalidomide 2 mg on days 1-21 of 28 day cycleon 04/13/17, dexa stopped on 07/06/2017 due to her CR -Sherestarted herPomalystin 03/2018(off for 2-3 months due to insurance, delivery issues)  INTERVAL HISTORY: Ms. HHigleyreturns for f/u and treatment as scheduled. She received Kyprolis on 09/13/18. Today she feels "okay." She is on week 2 of Pomalyst. She has occasional headache that is relieved with tylenol. Otherwise she has no complaints. Denies fever, chills, cough, chest pain, dyspnea, peripheral edema, bleeding, n/v/c/d, pain, or neuropathy.    MEDICAL HISTORY:  Past Medical History:  Diagnosis Date  . Chills with fever    intermittently since d/c from hospital  . Dysuria-frequency syndrome    w/ pink urine  . GERD (gastroesophageal reflux disease)   . Hepatitis   . History of positive PPD    DX 2011--  CXR DONE NO EVIDENCE  . History of ureter stent   . Hydronephrosis, right   . Neuromuscular disorder (HCC)    legs numb intermittently  . 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[place]   . Pneumonia   . Right ureteral stone   . Urosepsis 8/14   [facility]    SURGICAL HISTORY: Past Surgical History:  Procedure Laterality Date  . CYSTOSCOPY W/ URETERAL STENT PLACEMENT Right 09/25/2012   Procedure: CYSTOSCOPY WITH RETROGRADE PYELOGRAM/URETERAL STENT PLACEMENT;  Surgeon: TAlexis Frock MD;  Location: [facility];  Service: Urology;  Laterality: Right;  . CYSTOSCOPY WITH RETROGRADE PYELOGRAM, URETEROSCOPY AND STENT  PLACEMENT Right 10/15/2012   Procedure: CYSTOSCOPY WITH RETROGRADE PYELOGRAM, URETEROSCOPY AND REMOVAL STENT WITH  STENT PLACEMENT;  Surgeon: TAlexis Frock MD;  [facility]  Service: Urology;  Laterality: Right;  . ESOPHAGOGASTRODUODENOSCOPY (EGD) WITH PROPOFOL N/A 11/16/2014   Procedure: ESOPHAGOGASTRODUODENOSCOPY (EGD) WITH PROPOFOL;  Surgeon: DMilus Banister MD;  Location: [facility];  Service: Endoscopy;  Laterality: N/A;  . HOLMIUM LASER APPLICATION Right 96/56/8127  Procedure: HOLMIUM LASER APPLICATION;  Surgeon: TAlexis Frock MD;  [facility]  Service: Urology;  Laterality: Right;  . LIVER BIOPSY    . OTHER SURGICAL HISTORY Right    removal of ovarian cyst  . removal of uterine cyst     years ago  . RIGHT VATS W/ DRAINAGE PEURAL EFFUSION AND BX'S  10-30-2008    I have reviewed the social history and family history with the patient and they are unchanged from previous note.  ALLERGIES:  has No Known Allergies.  MEDICATIONS:  Current Outpatient Medications  Medication Sig Dispense Refill  . acyclovir (ZOVIRAX) 800 MG tablet Take 1 tablet (800 mg total) by mouth 2 (two) times daily. 60 tablet 3  . feeding supplement, ENSURE ENLIVE, (ENSURE ENLIVE) LIQD Take 237 mLs by mouth 2 (two) times daily between meals. 237 mL 12  . ibuprofen (ADVIL,MOTRIN) 200 MG tablet Take 200 mg by mouth every 6 (six) hours as needed for moderate pain.    . magic mouthwash SOLN Take 5 mLs by mouth 4 (four) times daily as  needed for mouth pain. (Patient not taking: Reported on 09/13/2018) 240 mL 3  . omeprazole (PRILOSEC) 20 MG capsule Take 1 capsule (20 mg total) by mouth daily. 30 capsule 5  . ondansetron (ZOFRAN ODT) 8 MG disintegrating tablet Take 1 tablet (8 mg total) by mouth every 8 (eight) hours as needed for nausea or vomiting. (Patient not taking: Reported on 09/13/2018) 40 tablet 1  . polyethylene glycol (MIRALAX / GLYCOLAX) packet Take 17 g by  mouth daily as needed (constipation). (Patient not taking: Reported on 09/13/2018) 14 each 1  . pomalidomide (POMALYST) 2 MG capsule Take 1 capsule (2 mg total) by mouth daily. Take with water on days 1-21. Repeat every 28 days. 21 capsule 0  . prochlorperazine (COMPAZINE) 10 MG tablet Take 1 tablet (10 mg total) by mouth every 6 (six) hours as needed for nausea or vomiting. (Patient not taking: Reported on 09/13/2018) 30 tablet 3  . promethazine (PHENERGAN) 25 MG tablet Take 1 tablet (25 mg total) by mouth every 6 (six) hours as needed for nausea or vomiting. (Patient not taking: Reported on 09/13/2018) 30 tablet 0  . simethicone (MYLICON) 80 MG chewable tablet 1 to 2 PO QID prn abdominal bloating (Patient not taking: Reported on 09/13/2018) 100 tablet 2  . traMADol (ULTRAM) 50 MG tablet Take 1 tablet (50 mg total) by mouth every 6 (six) hours as needed. (Patient not taking: Reported on 09/13/2018) 30 tablet 0  . VIREAD 300 MG tablet Take 1 tablet (300 mg total) by mouth daily. 30 tablet 11   No current facility-administered medications for this visit.    Facility-Administered Medications Ordered in Other Visits  Medication Dose Route Frequency Provider Last Rate Last Dose  . heparin lock flush 100 unit/mL  500 Units Intracatheter Once PRN Truitt Merle, MD      . sodium chloride 0.9 % injection 10 mL  10 mL Intravenous PRN Truitt Merle, MD   10 mL at 12/11/15 1532  . sodium chloride 0.9 % injection 10 mL  10 mL Intravenous PRN Truitt Merle, MD   10 mL at 06/11/16 1505  . sodium chloride 0.9 % injection 10 mL  10 mL Intravenous PRN Truitt Merle, MD   10 mL at 08/04/17 1643  . sodium chloride flush (NS) 0.9 % injection 10 mL  10 mL Intravenous PRN Truitt Merle, MD   10 mL at 10/09/15 1717  . sodium chloride flush (NS) 0.9 % injection 10 mL  10 mL Intracatheter PRN Truitt Merle, MD        PHYSICAL EXAMINATION: ECOG PERFORMANCE STATUS: 1 - Symptomatic but completely ambulatory  Vitals:   09/27/18 0946  BP: 134/76   Pulse: 77  Resp: 18  Temp: 98.5 F (36.9 C)  SpO2: 100%   Filed Weights   09/27/18 0946  Weight: 145 lb 4.8 oz (65.9 kg)    GENERAL:alert, no distress and comfortable SKIN: no rash  EYES:  sclera clear LUNGS: clear to auscultation with normal breathing effort HEART: regular rate & rhythm, no lower extremity edema ABDOMEN:abdomen soft, non-tender and normal bowel sounds Musculoskeletal:no cyanosis of digits  NEURO: alert & oriented x 3 with fluent speech, normal gait PAC without erythema   LABORATORY DATA:  I have reviewed the data as listed CBC Latest Ref Rng & Units 09/27/2018 09/13/2018 08/30/2018  WBC 4.0 - 10.5 K/uL 4.4 3.8(L) 5.6  Hemoglobin 12.0 - 15.0 g/dL 10.9(L) 10.8(L) 10.5(L)  Hematocrit 36.0 - 46.0 % 34.7(L) 34.6(L) 33.9(L)  Platelets 150 -  400 K/uL 279 182 294     CMP Latest Ref Rng & Units 09/27/2018 09/13/2018 08/30/2018  Glucose 70 - 99 mg/dL 91 113(H) 88  BUN 6 - 20 mg/dL 19 18 21(H)  Creatinine 0.44 - 1.00 mg/dL 0.76 0.83 0.75  Sodium 135 - 145 mmol/L 140 141 141  Potassium 3.5 - 5.1 mmol/L 4.1 3.8 3.9  Chloride 98 - 111 mmol/L 108 106 108  CO2 22 - 32 mmol/L _0 Calcium 8.9 - 10.3 mg/dL 9.2 9.6 9.3  Total Protein 6.5 - 8.1 g/dL 6.9 6.9 6.8  Total Bilirubin 0.3 - 1.2 mg/dL 0.2(L) 0.3 0.2(L)  Alkaline Phos 38 - 126 U/L 92 77 94  AST 15 - 41 U/L _1 ALT 0 - 44 U/L 8 8 <6      RADIOGRAPHIC STUDIES: I have personally reviewed the radiological images as listed and agreed with the findings in the report. No results found.   ASSESSMENT & PLAN: Ayodele Hdokis [id number]   1. 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 -She was diagnosed in 10/2014. She was treated with CyborD and bone marrow transplant. She progressed on maintenance Velcade in 02/2016. She was treated with more Dara and CyborD, which was stopped after a severe Hep B flare. -She is currently being treatedwith Carfilzomib and  Pomalyst.She is tolerating well overall. -Shewas offPomalyst for 2-3 monthsfor insurance/refill issueand restarted Pomalystin 03/2018. Continue to ship her medication to [facility] for her to pick up. -[facility]. Dr. Norma Fredrickson reviewed her biopsy and considered her to be in remission.This was previously reviewed by Dr. Burr Medico -M protein has been negative, light chains WNL; her leukemia is still in remission -she continues pomalyst 3 weeks on/1 week off and kyprolis on days 1 and 15 -she was recently seen by Dr. Tiana Loft at [facility]  2.Cervical radiculopathy -Her MRI Spine from 03/09/18 showedmild cervical disk bulging, but overall benign. This is likely nerve related. -she wasevaluated by neurologist Dr. Mickeal Skinner in our cancer center, and received a 5 days of steroids, much improved now. -she has generalized body acheson treatment, more in her neck recently especially at work. I encouraged her to use heat and tylenol PRN.  -she denies pain today, other than intermittent mild headache that resolves with tylenol PRN  3.Chronichepatitis B infection -Shehas knownchronichepatitis B infection,developed significant transaminitis and hyperbilirubinemiainJanuary 2019 -followed by ID Dr. Baxter Flattery, I reached out to MD today as patient has not had recent f/u -on Viread 300 mg daily  4. Type 2 diabetes, steroids induced -Previouslytreated with Metformin.No longer ondexamethasone. -resolved  5. GERD, history of GI bleeding and nausea -Colonoscopy on 11/26/16 per Dr. Ardis Hughs notable for a polyp in the transverse colon, revealed to be tubular adenoma and negative for high grade dysplasia or malignancy.  -continues PPI daily  -she reports intermittent nausea after infusion, managed well with oral anti-emetics -denies nausea and vomiting today  Disposition: Ms. Decelle appears unchanged. She completed another cycle of Kyprolis  and continues Pomalyst 21 days on and 7 days off. She is tolerating treatment well overall. Labs reviewed, CBC and CMP stable. Last M protein remains undetectable. Continue current therapy. She will proceed with another cycle of Kyprolis today. She will continue Pomalyst for this week and next, to total 21 days then off 1 week. Will f/u on the refill. I urged her to call us if she does not receive next fill on in time to start next  cycle, she agrees. She will return for f/u and next cycle Kyprolis in 2 weeks.   All questions were answered. The patient knows to call the clinic with any problems, questions or concerns. No barriers to learning was detected.     Sheryl Feeling, NP 09/27/18

## 2018-09-27 ENCOUNTER — Inpatient Hospital Stay: Payer: Medicaid Other

## 2018-09-27 ENCOUNTER — Telehealth: Payer: Self-pay | Admitting: Nurse Practitioner

## 2018-09-27 ENCOUNTER — Inpatient Hospital Stay (HOSPITAL_BASED_OUTPATIENT_CLINIC_OR_DEPARTMENT_OTHER): Payer: Medicaid Other | Admitting: Nurse Practitioner

## 2018-09-27 ENCOUNTER — Other Ambulatory Visit: Payer: Self-pay

## 2018-09-27 ENCOUNTER — Encounter: Payer: Self-pay | Admitting: Nurse Practitioner

## 2018-09-27 VITALS — BP 134/76 | HR 77 | Temp 98.5°F | Resp 18 | Ht 62.0 in | Wt 145.3 lb

## 2018-09-27 DIAGNOSIS — C9011 Plasma cell leukemia in remission: Secondary | ICD-10-CM | POA: Diagnosis not present

## 2018-09-27 DIAGNOSIS — C9010 Plasma cell leukemia not having achieved remission: Secondary | ICD-10-CM

## 2018-09-27 DIAGNOSIS — Z95828 Presence of other vascular implants and grafts: Secondary | ICD-10-CM

## 2018-09-27 DIAGNOSIS — Z5112 Encounter for antineoplastic immunotherapy: Secondary | ICD-10-CM | POA: Diagnosis not present

## 2018-09-27 DIAGNOSIS — C9012 Plasma cell leukemia in relapse: Secondary | ICD-10-CM

## 2018-09-27 LAB — CBC WITH DIFFERENTIAL (CANCER CENTER ONLY)
Abs Immature Granulocytes: 0.07 10*3/uL (ref 0.00–0.07)
Basophils Absolute: 0.1 10*3/uL (ref 0.0–0.1)
Basophils Relative: 1 %
Eosinophils Absolute: 0.1 10*3/uL (ref 0.0–0.5)
Eosinophils Relative: 3 %
HCT: 34.7 % — ABNORMAL LOW (ref 36.0–46.0)
Hemoglobin: 10.9 g/dL — ABNORMAL LOW (ref 12.0–15.0)
Immature Granulocytes: 2 %
Lymphocytes Relative: 21 %
Lymphs Abs: 0.9 10*3/uL (ref 0.7–4.0)
MCH: 29.3 pg (ref 26.0–34.0)
MCHC: 31.4 g/dL (ref 30.0–36.0)
MCV: 93.3 fL (ref 80.0–100.0)
Monocytes Absolute: 0.4 10*3/uL (ref 0.1–1.0)
Monocytes Relative: 9 %
Neutro Abs: 2.9 10*3/uL (ref 1.7–7.7)
Neutrophils Relative %: 64 %
Platelet Count: 279 10*3/uL (ref 150–400)
RBC: 3.72 MIL/uL — ABNORMAL LOW (ref 3.87–5.11)
RDW: 13.6 % (ref 11.5–15.5)
WBC Count: 4.4 10*3/uL (ref 4.0–10.5)
nRBC: 0 % (ref 0.0–0.2)

## 2018-09-27 LAB — CMP (CANCER CENTER ONLY)
ALT: 8 U/L (ref 0–44)
AST: 26 U/L (ref 15–41)
Albumin: 3.4 g/dL — ABNORMAL LOW (ref 3.5–5.0)
Alkaline Phosphatase: 92 U/L (ref 38–126)
Anion gap: 8 (ref 5–15)
BUN: 19 mg/dL (ref 6–20)
CO2: 24 mmol/L (ref 22–32)
Calcium: 9.2 mg/dL (ref 8.9–10.3)
Chloride: 108 mmol/L (ref 98–111)
Creatinine: 0.76 mg/dL (ref 0.44–1.00)
GFR, Est AFR Am: >60 mL/min (ref >60)
GFR, Estimated: >60 mL/min (ref >60)
Glucose, Bld: 91 mg/dL (ref 70–99)
Potassium: 4.1 mmol/L (ref 3.5–5.1)
Sodium: 140 mmol/L (ref 135–145)
Total Bilirubin: 0.2 mg/dL — ABNORMAL LOW (ref 0.3–1.2)
Total Protein: 6.9 g/dL (ref 6.5–8.1)

## 2018-09-27 LAB — PREGNANCY, URINE: Preg Test, Ur: NEGATIVE

## 2018-09-27 MED ORDER — PALONOSETRON HCL INJECTION 0.25 MG/5ML
0.2500 mg | Freq: Once | INTRAVENOUS | Status: AC
  Administered 2018-09-27: 0.25 mg via INTRAVENOUS

## 2018-09-27 MED ORDER — DEXTROSE 5 % IV SOLN
56.0000 mg/m2 | Freq: Once | INTRAVENOUS | Status: AC
  Administered 2018-09-27: 90 mg via INTRAVENOUS
  Filled 2018-09-27: qty 30

## 2018-09-27 MED ORDER — SODIUM CHLORIDE 0.9 % IV SOLN
Freq: Once | INTRAVENOUS | Status: AC
  Administered 2018-09-27: 12:00:00 via INTRAVENOUS
  Filled 2018-09-27: qty 250

## 2018-09-27 MED ORDER — SODIUM CHLORIDE 0.9% FLUSH
10.0000 mL | INTRAVENOUS | Status: DC | PRN
  Administered 2018-09-27: 10 mL
  Filled 2018-09-27: qty 10

## 2018-09-27 MED ORDER — PROCHLORPERAZINE MALEATE 10 MG PO TABS
ORAL_TABLET | ORAL | Status: AC
  Filled 2018-09-27: qty 1

## 2018-09-27 MED ORDER — SODIUM CHLORIDE 0.9% FLUSH
10.0000 mL | Freq: Once | INTRAVENOUS | Status: DC
  Filled 2018-09-27: qty 10

## 2018-09-27 MED ORDER — PALONOSETRON HCL INJECTION 0.25 MG/5ML
INTRAVENOUS | Status: AC
  Filled 2018-09-27: qty 5

## 2018-09-27 MED ORDER — SODIUM CHLORIDE 0.9 % IV SOLN
Freq: Once | INTRAVENOUS | Status: AC
  Administered 2018-09-27: 11:00:00 via INTRAVENOUS
  Filled 2018-09-27: qty 250

## 2018-09-27 MED ORDER — HEPARIN SOD (PORK) LOCK FLUSH 100 UNIT/ML IV SOLN
500.0000 [IU] | Freq: Once | INTRAVENOUS | Status: AC | PRN
  Administered 2018-09-27: 500 [IU]
  Filled 2018-09-27: qty 5

## 2018-09-27 MED ORDER — DEXAMETHASONE SODIUM PHOSPHATE 10 MG/ML IJ SOLN
8.0000 mg | Freq: Once | INTRAMUSCULAR | Status: AC
  Administered 2018-09-27: 8 mg via INTRAVENOUS

## 2018-09-27 MED ORDER — DEXAMETHASONE SODIUM PHOSPHATE 10 MG/ML IJ SOLN
INTRAMUSCULAR | Status: AC
  Filled 2018-09-27: qty 1

## 2018-09-27 MED ORDER — PROCHLORPERAZINE MALEATE 10 MG PO TABS
10.0000 mg | ORAL_TABLET | Freq: Once | ORAL | Status: AC
  Administered 2018-09-27: 10 mg via ORAL

## 2018-09-27 NOTE — Patient Instructions (Signed)
[facility] Discharge Instructions for Patients Receiving Chemotherapy  Today you received the following chemotherapy agents:  Kyprolis  To help prevent nausea and vomiting after your treatment, we encourage you to take your nausea medication as directed.   If you develop nausea and vomiting that is not controlled by your nausea medication, call the clinic.   BELOW ARE SYMPTOMS THAT SHOULD BE REPORTED IMMEDIATELY:  *FEVER GREATER THAN 100.5 F  *CHILLS WITH OR WITHOUT FEVER  NAUSEA AND VOMITING THAT IS NOT CONTROLLED WITH YOUR NAUSEA MEDICATION  *UNUSUAL SHORTNESS OF BREATH  *UNUSUAL BRUISING OR BLEEDING  TENDERNESS IN MOUTH AND THROAT WITH OR WITHOUT PRESENCE OF ULCERS  *URINARY PROBLEMS  *BOWEL PROBLEMS  UNUSUAL RASH Items with * indicate a potential emergency and should be followed up as soon as possible.  Feel free to call the clinic should you have any questions or concerns. The clinic phone number is [phone].  Please show the CHEMO ALERT CARD at check-in to the Emergency Department and triage nurse.   

## 2018-09-27 NOTE — Telephone Encounter (Signed)
Per 8/24 los Add f/u before treatment in 2 weeks .  Sent a message to get LB added for 12:15. (okay per MD)

## 2018-09-28 ENCOUNTER — Other Ambulatory Visit: Payer: Self-pay

## 2018-09-28 DIAGNOSIS — C9012 Plasma cell leukemia in relapse: Secondary | ICD-10-CM

## 2018-09-28 MED ORDER — POMALIDOMIDE 2 MG PO CAPS: 2.0000 mg | ORAL_CAPSULE | Freq: Every day | ORAL | 0 refills | Status: DC

## 2018-09-29 ENCOUNTER — Telehealth: Payer: Self-pay

## 2018-09-29 NOTE — Telephone Encounter (Signed)
Faxed script for [employer] with [employer] authorization number [id number] to [facility] fax [phone], sent to HIM for scanning to chart.

## 2018-09-29 NOTE — Telephone Encounter (Signed)
-----   Message from Reggy Eye, [place] sent at 09/29/2018 11:10 AM EDT -----  ----- Message ----- From: Carlyle Basques, MD Sent: 09/28/2018   5:08 PM EDT To: Reggy Eye, CMA  Can I see her at next available appt. Needs interpreter ----- Message ----- From: Alla Feeling, NP Sent: 09/27/2018  11:01 AM EDT To: Carlyle Basques, MD  Dr. Baxter Flattery,  Just want to f/u and verify she is to continue viread 300 mg daily. Ms. Nipple has not seen you in a while. She is doing well, tolerating medication fine. Thanks, Regan Rakers NP [facility])

## 2018-09-29 NOTE — Telephone Encounter (Signed)
Called patient's mobile to set up appointment with Dr. Baxter Flattery but voice mail has not been set up. Also called Home Number but someone hung up once I asked for the patient.

## 2018-09-29 NOTE — Telephone Encounter (Signed)
-----   Message from Reggy Eye, [place] sent at 09/29/2018 11:10 AM EDT -----  ----- Message ----- From: Carlyle Basques, MD Sent: 09/28/2018   5:08 PM EDT To: Reggy Eye, CMA  Can I see her at next available appt. Needs interpreter ----- Message ----- From: Alla Feeling, NP Sent: 09/27/2018  11:01 AM EDT To: Carlyle Basques, MD  Dr. Baxter Flattery,  Just want to f/u and verify she is to continue viread 300 mg daily. Ms. Saltz has not seen you in a while. She is doing well, tolerating medication fine. Thanks, Regan Rakers NP [facility])

## 2018-09-30 ENCOUNTER — Telehealth: Payer: Self-pay

## 2018-09-30 NOTE — Telephone Encounter (Signed)
[facility] [employer] calls stating that they wanted to arrange shipment of her Pomalyst, when I called back they said it was too early to ship it out.  They will call back next week.

## 2018-10-04 ENCOUNTER — Other Ambulatory Visit: Payer: Self-pay

## 2018-10-04 DIAGNOSIS — C9010 Plasma cell leukemia not having achieved remission: Secondary | ICD-10-CM

## 2018-10-12 ENCOUNTER — Other Ambulatory Visit: Payer: Self-pay

## 2018-10-12 ENCOUNTER — Inpatient Hospital Stay: Payer: Medicaid Other

## 2018-10-12 ENCOUNTER — Other Ambulatory Visit: Payer: Self-pay | Admitting: Hematology

## 2018-10-12 ENCOUNTER — Ambulatory Visit: Payer: Medicaid Other | Admitting: Nurse Practitioner

## 2018-10-12 ENCOUNTER — Inpatient Hospital Stay: Payer: Medicaid Other | Attending: Hematology

## 2018-10-12 ENCOUNTER — Inpatient Hospital Stay (HOSPITAL_BASED_OUTPATIENT_CLINIC_OR_DEPARTMENT_OTHER): Payer: Medicaid Other | Admitting: Nurse Practitioner

## 2018-10-12 ENCOUNTER — Encounter: Payer: Self-pay | Admitting: Nurse Practitioner

## 2018-10-12 VITALS — BP 130/79 | HR 62 | Temp 98.4°F | Resp 18 | Wt 144.0 lb

## 2018-10-12 DIAGNOSIS — C9010 Plasma cell leukemia not having achieved remission: Secondary | ICD-10-CM

## 2018-10-12 DIAGNOSIS — Z5112 Encounter for antineoplastic immunotherapy: Secondary | ICD-10-CM | POA: Diagnosis present

## 2018-10-12 DIAGNOSIS — C9011 Plasma cell leukemia in remission: Secondary | ICD-10-CM | POA: Insufficient documentation

## 2018-10-12 DIAGNOSIS — Z95828 Presence of other vascular implants and grafts: Secondary | ICD-10-CM

## 2018-10-12 DIAGNOSIS — Z23 Encounter for immunization: Secondary | ICD-10-CM | POA: Insufficient documentation

## 2018-10-12 DIAGNOSIS — C9012 Plasma cell leukemia in relapse: Secondary | ICD-10-CM

## 2018-10-12 LAB — CMP (CANCER CENTER ONLY)
ALT: <6 U/L (ref 0–44)
AST: 21 U/L (ref 15–41)
Albumin: 3.4 g/dL — ABNORMAL LOW (ref 3.5–5.0)
Alkaline Phosphatase: 73 U/L (ref 38–126)
Anion gap: 6 (ref 5–15)
BUN: 16 mg/dL (ref 6–20)
CO2: 27 mmol/L (ref 22–32)
Calcium: 9.2 mg/dL (ref 8.9–10.3)
Chloride: 107 mmol/L (ref 98–111)
Creatinine: 0.75 mg/dL (ref 0.44–1.00)
GFR, Est AFR Am: >60 mL/min (ref >60)
GFR, Estimated: >60 mL/min (ref >60)
Glucose, Bld: 106 mg/dL — ABNORMAL HIGH (ref 70–99)
Potassium: 3.7 mmol/L (ref 3.5–5.1)
Sodium: 140 mmol/L (ref 135–145)
Total Bilirubin: 0.3 mg/dL (ref 0.3–1.2)
Total Protein: 6.8 g/dL (ref 6.5–8.1)

## 2018-10-12 LAB — CBC WITH DIFFERENTIAL (CANCER CENTER ONLY)
Abs Immature Granulocytes: 0.01 10*3/uL (ref 0.00–0.07)
Basophils Absolute: 0.1 10*3/uL (ref 0.0–0.1)
Basophils Relative: 2 %
Eosinophils Absolute: 0.2 10*3/uL (ref 0.0–0.5)
Eosinophils Relative: 4 %
HCT: 33.1 % — ABNORMAL LOW (ref 36.0–46.0)
Hemoglobin: 10.5 g/dL — ABNORMAL LOW (ref 12.0–15.0)
Immature Granulocytes: 0 %
Lymphocytes Relative: 29 %
Lymphs Abs: 1 10*3/uL (ref 0.7–4.0)
MCH: 29.6 pg (ref 26.0–34.0)
MCHC: 31.7 g/dL (ref 30.0–36.0)
MCV: 93.2 fL (ref 80.0–100.0)
Monocytes Absolute: 0.6 10*3/uL (ref 0.1–1.0)
Monocytes Relative: 17 %
Neutro Abs: 1.6 10*3/uL — ABNORMAL LOW (ref 1.7–7.7)
Neutrophils Relative %: 48 %
Platelet Count: 186 10*3/uL (ref 150–400)
RBC: 3.55 MIL/uL — ABNORMAL LOW (ref 3.87–5.11)
RDW: 13.8 % (ref 11.5–15.5)
WBC Count: 3.4 10*3/uL — ABNORMAL LOW (ref 4.0–10.5)
nRBC: 0 % (ref 0.0–0.2)

## 2018-10-12 LAB — PREGNANCY, URINE: Preg Test, Ur: NEGATIVE

## 2018-10-12 MED ORDER — PALONOSETRON HCL INJECTION 0.25 MG/5ML
INTRAVENOUS | Status: AC
  Filled 2018-10-12: qty 5

## 2018-10-12 MED ORDER — PALONOSETRON HCL INJECTION 0.25 MG/5ML
0.2500 mg | Freq: Once | INTRAVENOUS | Status: AC
  Administered 2018-10-12: 0.25 mg via INTRAVENOUS

## 2018-10-12 MED ORDER — PROCHLORPERAZINE MALEATE 10 MG PO TABS
ORAL_TABLET | ORAL | Status: AC
  Filled 2018-10-12: qty 1

## 2018-10-12 MED ORDER — POMALIDOMIDE 2 MG PO CAPS: 2.0000 mg | ORAL_CAPSULE | Freq: Every day | ORAL | 0 refills | Status: DC

## 2018-10-12 MED ORDER — DEXAMETHASONE SODIUM PHOSPHATE 10 MG/ML IJ SOLN
INTRAMUSCULAR | Status: AC
  Filled 2018-10-12: qty 1

## 2018-10-12 MED ORDER — SODIUM CHLORIDE 0.9 % IV SOLN
Freq: Once | INTRAVENOUS | Status: AC
  Administered 2018-10-12: 13:00:00 via INTRAVENOUS
  Filled 2018-10-12: qty 250

## 2018-10-12 MED ORDER — SODIUM CHLORIDE 0.9% FLUSH
10.0000 mL | Freq: Once | INTRAVENOUS | Status: AC
  Administered 2018-10-12: 10 mL
  Filled 2018-10-12: qty 10

## 2018-10-12 MED ORDER — SODIUM CHLORIDE 0.9% FLUSH
10.0000 mL | INTRAVENOUS | Status: DC | PRN
  Administered 2018-10-12: 10 mL
  Filled 2018-10-12: qty 10

## 2018-10-12 MED ORDER — DEXAMETHASONE SODIUM PHOSPHATE 10 MG/ML IJ SOLN
8.0000 mg | Freq: Once | INTRAMUSCULAR | Status: AC
  Administered 2018-10-12: 8 mg via INTRAVENOUS

## 2018-10-12 MED ORDER — SODIUM CHLORIDE 0.9 % IV SOLN
Freq: Once | INTRAVENOUS | Status: AC
  Administered 2018-10-12: 12:00:00 via INTRAVENOUS
  Filled 2018-10-12: qty 250

## 2018-10-12 MED ORDER — PROCHLORPERAZINE MALEATE 10 MG PO TABS
10.0000 mg | ORAL_TABLET | Freq: Once | ORAL | Status: AC
  Administered 2018-10-12: 10 mg via ORAL

## 2018-10-12 MED ORDER — DEXTROSE 5 % IV SOLN
56.0000 mg/m2 | Freq: Once | INTRAVENOUS | Status: AC
  Administered 2018-10-12: 90 mg via INTRAVENOUS
  Filled 2018-10-12: qty 30

## 2018-10-12 MED ORDER — HEPARIN SOD (PORK) LOCK FLUSH 100 UNIT/ML IV SOLN
500.0000 [IU] | Freq: Once | INTRAVENOUS | Status: AC | PRN
  Administered 2018-10-12: 500 [IU]
  Filled 2018-10-12: qty 5

## 2018-10-12 NOTE — Patient Instructions (Signed)
Cancer Center Discharge Instructions for Patients Receiving Chemotherapy  Today you received the following chemotherapy agents: Carfilzomib (Kyprolis)  To help prevent nausea and vomiting after your treatment, we encourage you to take your nausea medication as directed.    If you develop nausea and vomiting that is not controlled by your nausea medication, call the clinic.   BELOW ARE SYMPTOMS THAT SHOULD BE REPORTED IMMEDIATELY:  *FEVER GREATER THAN 100.5 F  *CHILLS WITH OR WITHOUT FEVER  NAUSEA AND VOMITING THAT IS NOT CONTROLLED WITH YOUR NAUSEA MEDICATION  *UNUSUAL SHORTNESS OF BREATH  *UNUSUAL BRUISING OR BLEEDING  TENDERNESS IN MOUTH AND THROAT WITH OR WITHOUT PRESENCE OF ULCERS  *URINARY PROBLEMS  *BOWEL PROBLEMS  UNUSUAL RASH Items with * indicate a potential emergency and should be followed up as soon as possible.  Feel free to call the clinic should you have any questions or concerns. The clinic phone number is [phone].  Please show the CHEMO ALERT CARD at check-in to the Emergency Department and triage nurse.  Coronavirus (COVID-19) Are you at risk?  Are you at risk for the Coronavirus (COVID-19)?  To be considered HIGH RISK for Coronavirus (COVID-19), you have to meet the following criteria:  . Traveled to [place], [place], [place], [place] or [place]; or in the [place] to [place], [place], [place], or [place]; and have fever, cough, and shortness of breath within the last 2 weeks of travel OR . Been in close contact with a person diagnosed with COVID-19 within the last 2 weeks and have fever, cough, and shortness of breath . IF YOU DO NOT MEET THESE CRITERIA, YOU ARE CONSIDERED LOW RISK FOR COVID-19.  What to do if you are HIGH RISK for COVID-19?  . If you are having a medical emergency, call 911. . Seek medical care right away. Before you go to a doctor's office, urgent care or emergency department, call ahead and tell them  about your recent travel, contact with someone diagnosed with COVID-19, and your symptoms. You should receive instructions from your physician's office regarding next steps of care.  . When you arrive at healthcare provider, tell the healthcare staff immediately you have returned from visiting [place], [place], [place], [place] or [place]; or traveled in the [place] to [place], [place], [place], or [place]; in the last two weeks or you have been in close contact with a person diagnosed with COVID-19 in the last 2 weeks.   . Tell the health care staff about your symptoms: fever, cough and shortness of breath. . After you have been seen by a medical provider, you will be either: o Tested for (COVID-19) and discharged home on quarantine except to seek medical care if symptoms worsen, and asked to  - Stay home and avoid contact with others until you get your results (4-5 days)  - Avoid travel on public transportation if possible (such as bus, train, or airplane) or o Sent to the Emergency Department by EMS for evaluation, COVID-19 testing, and possible admission depending on your condition and test results.  What to do if you are LOW RISK for COVID-19?  Reduce your risk of any infection by using the same precautions used for avoiding the common cold or flu:  . Wash your hands often with soap and warm water for at least 20 seconds.  If soap and water are not readily available, use an alcohol-based hand sanitizer with at least 60% alcohol.  . If coughing   or sneezing, cover your mouth and nose by coughing or sneezing into the elbow areas of your shirt or coat, into a tissue or into your sleeve (not your hands). . Avoid shaking hands with others and consider head nods or verbal greetings only. . Avoid touching your eyes, nose, or mouth with unwashed hands.  . Avoid close contact with people who are sick. . Avoid places or events with large numbers of people in one location, like concerts or  sporting events. . Carefully consider travel plans you have or are making. . If you are planning any travel outside or inside the [place], visit the CDC's Travelers' Health webpage for the latest health notices. . If you have some symptoms but not all symptoms, continue to monitor at home and seek medical attention if your symptoms worsen. . If you are having a medical emergency, call 911.   ADDITIONAL HEALTHCARE OPTIONS FOR PATIENTS  [facility] / e-Visit: [email or web address]         [facility]: [phone]  [facility]: [phone]                   [facility]: [phone]   

## 2018-10-12 NOTE — Progress Notes (Signed)
[name]   [phone] [phone]   Clinic Follow up Note   Patient Care Team: Sheryl Junior, MD as PCP - General (Family Medicine) Sheryl Junior, MD as Referring Physician (Specialist) Sheryl Junior, MD as Referring Physician (Specialist) Sheryl [name], Sheryl Hatcher, MD as Referring Physician (Hematology and Oncology) Sheryl Basques, MD as Consulting Physician (Infectious Diseases) 10/12/2018  CHIEF COMPLAINT: f/u plasma cell leukemia   SUMMARY OF ONCOLOGIC HISTORY: Oncology History  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Sheryl [name])  10/07/2014 Imaging   Abdominal ultrasound showed mild splenomegaly, stable perisplenic complex fluid collection unchanged since 08/27/2010.   10/10/2014 Miscellaneous   Peripheral blood chemistry and leukocytosis with total white count 78K, comprised of large plasma cells and his normocytic anemia. There is a myeloid left shift with previous surgical radium blasts. Flow cytometry showed 64% plasma cells   10/10/2014 Bone Marrow Biopsy   Markedly hypercellular marrow (95%), Atypical plasma cells comprise 57% of the cellularity. There was diminished multilineage in hematopoiesis with adequate maturation. Breasts less than 1%), no overt dysplasia of the myeloid or erythroid lineages.    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   05/07/2015 - 05/12/2015 Hospital Admission   [facility], [facility] ID workup was negative, EGD  showed evidence of gastritis and duodenitis, no H. pylori or CMV.   05/20/2015 - 05/24/2015 Hospital Admission   [facility]   07/11/2015 Bone Marrow Biopsy   Post transplant 100 a bone marrow biopsy showed hypocellular marrow, 20%, no increase in plasma cells (2%) or other abnormalities.   08/22/2015 - 01/02/2016 Chemotherapy   MaintenanceCyborD (cytoxan 3058mm2 iv, bortezomib 1.5 mg/m, dexamethasone 40 mg, every 2 weeks, changed to Velcade maintenance after 4 months treatment   01/16/2016 - 04/19/2016 Chemotherapy   Maintenance Velcade 1.3 mg/m every 2 weeks   02/22/2016 Pathology Results   BONE MARROW: Diagnosis Bone Marrow, Aspirate,Biopsy, and Clot, right iliac - NORMOCELLULAR BONE MARROW FOR AGE WITH TRILINEAGE HEMATOPOIESIS. - PLASMACYTOSIS (PLASMA CELLS 12%). - SEE COMMENT. PERIPHERAL BLOOD: - OCCASIONAL CIRCULATING PLASMA CELLS.   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    04/18/2016 - 03/26/2017 Chemotherapy   Daratumumab per protocol  CyBorD every week, cytoxan was held after 04/24/2016 dye to cytopenia and infection  -discontinued due to Hep B flare   05/11/2016 - 05/16/2016 Hospital Admission   Healthcare-associated pneumonia   02/16/2017 - 02/21/2017 Hospital Admission   Admission diagnosis: Abnormal LFT's Additional comments: Assoc diagnoses: Hep B flare, transminitis, dehydration, fever, SIRS   04/06/2017 -  Chemotherapy   Carfilzomib 56 mg/m2 on days 1 and 15(except 2037m2 on C1D1 and C1D2)with dexamethasone 20 mg on same days and pomalidomide 2 mg on days 1-21 of 28 day cycleon 04/13/17, dexa stopped on 07/06/2017 due to her CR  -She restarted her Pomalyst in 03/2018 (off for 2-3 months due to insurance, delivery issues)   08/26/2017 - 08/28/2017 Hospital Admission   Admit date: 08/26/2017  Admission diagnosis: RLL Pneumonia    04/19/2018 Bone Marrow Biopsy    Bone Marrow Biopsy 04/19/18 at [facility] Bone Marrow (BM) and Peripheral Blood (PB) FINAL PATHOLOGIC DIAGNOSIS BONE MARROW: Hypocellular marrow (20%) with trilineage hematopoiesis. No [plasma cell myeloma identified. See comment. PERIPHERAL BLOOD: Mild anemia. No circulating plasma cells seen. See CBC data and comment.      CURRENT THERAPY:  Carfilzomib 56 mg/m2 on days 1 and 15(except 42m/m2 on C1D1 and C1D2)with dexamethasone 20 mg on same days and pomalidomide 2 mg on days 1-21 of 28 day cycleon 04/13/17, dexa stopped on 07/06/2017 due to her CR -Sherestarted herPomalystin 03/2018(off for 2-3 months due to insurance, delivery issues)  INTERVAL HISTORY: Ms. HRishelreturns for f/u and treatment as scheduled. She completed last cycle Kyprolis on 09/27/18. She is off Pomalyst this week. She felt chilly and has a headache today, she otherwise feels well. Headaches usually resolve with tylenol. She is tired in general, but functional. She is able to work some. Appetite is normal. Denies mucositis. Denies n/v/c/d. Denies fever, cough, chest pain, dyspnea, or pain.    MEDICAL HISTORY:  Past Medical History:  Diagnosis Date  . Chills with fever    intermittently since d/c from hospital  . Dysuria-frequency syndrome    w/ pink urine  . GERD (gastroesophageal reflux disease)   . Hepatitis   . History of positive PPD    DX 2011--  CXR DONE NO EVIDENCE  . History of ureter stent   . Hydronephrosis, right   . Neuromuscular disorder (HCC)    legs numb intermittently  . 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   . Pneumonia   . Right ureteral stone   . Urosepsis 8/14   ad[facility]    SURGICAL HISTORY: Past Surgical History:  Procedure Laterality Date  . CYSTOSCOPY W/ URETERAL STENT PLACEMENT Right 09/25/2012   Procedure: CYSTOSCOPY WITH RETROGRADE PYELOGRAM/URETERAL STENT PLACEMENT;  Surgeon: TAlexis Frock MD;  Location: [facility];  Service: Urology;  Laterality:  Right;  . CYSTOSCOPY WITH RETROGRADE PYELOGRAM, URETEROSCOPY AND STENT PLACEMENT Right 10/15/2012   Procedure: CYSTOSCOPY WITH RETROGRADE PYELOGRAM, URETEROSCOPY AND REMOVAL STENT WITH  STENT PLACEMENT;  Surgeon: TAlexis Frock MD;  [facility]  Service: Urology;  Laterality: Right;  . ESOPHAGOGASTRODUODENOSCOPY (EGD) WITH PROPOFOL N/A 11/16/2014   Procedure: ESOPHAGOGASTRODUODENOSCOPY (EGD) WITH PROPOFOL;  Surgeon: DMilus Banister MD;  Location: [facility];  Service: Endoscopy;  Laterality: N/A;  . HOLMIUM LASER APPLICATION Right [id number]/29/5188  Procedure: HOLMIUM LASER APPLICATION;  Surgeon: TAlexis Frock MD;  [facility]  Service: Urology;  Laterality: Right;  . LIVER BIOPSY    . OTHER SURGICAL HISTORY Right    removal of ovarian cyst  . removal of uterine cyst     years ago  . RIGHT VATS W/ DRAINAGE PEURAL EFFUSION AND BX'S  10-30-2008    I have reviewed the social history and family history with the patient and they are unchanged from previous note.  ALLERGIES:  has No Known Allergies.  MEDICATIONS:  Current Outpatient Medications  Medication Sig Dispense Refill  . acyclovir (ZOVIRAX) 800 MG tablet Take 1 tablet (800 mg total) by mouth 2 (two) times daily. 60 tablet 3  . feeding supplement, ENSURE ENLIVE, (ENSURE ENLIVE) LIQD Take 237 mLs by mouth 2 (two) times daily between meals. 237 mL 12  . ibuprofen (ADVIL,MOTRIN) 200 MG tablet Take 200 mg by mouth every 6 (six) hours as needed for moderate pain.    .Marland Kitchen  magic mouthwash SOLN Take 5 mLs by mouth 4 (four) times daily as needed for mouth pain. (Patient not taking: Reported on 09/13/2018) 240 mL 3  . omeprazole (PRILOSEC) 20 MG capsule Take 1 capsule (20 mg total) by mouth daily. 30 capsule 5  . ondansetron (ZOFRAN ODT) 8 MG disintegrating tablet Take 1 tablet (8 mg total) by mouth every 8 (eight) hours as needed for nausea or vomiting. (Patient not taking: Reported on 09/13/2018) 40  tablet 1  . polyethylene glycol (MIRALAX / GLYCOLAX) packet Take 17 g by mouth daily as needed (constipation). (Patient not taking: Reported on 09/13/2018) 14 each 1  . pomalidomide (POMALYST) 2 MG capsule Take 1 capsule (2 mg total) by mouth daily. Take with water on days 1-21. Repeat every 28 days. 21 capsule 0  . prochlorperazine (COMPAZINE) 10 MG tablet Take 1 tablet (10 mg total) by mouth every 6 (six) hours as needed for nausea or vomiting. (Patient not taking: Reported on 09/13/2018) 30 tablet 3  . promethazine (PHENERGAN) 25 MG tablet Take 1 tablet (25 mg total) by mouth every 6 (six) hours as needed for nausea or vomiting. (Patient not taking: Reported on 09/13/2018) 30 tablet 0  . simethicone (MYLICON) 80 MG chewable tablet 1 to 2 PO QID prn abdominal bloating (Patient not taking: Reported on 09/13/2018) 100 tablet 2  . traMADol (ULTRAM) 50 MG tablet Take 1 tablet (50 mg total) by mouth every 6 (six) hours as needed. (Patient not taking: Reported on 09/13/2018) 30 tablet 0  . VIREAD 300 MG tablet Take 1 tablet (300 mg total) by mouth daily. 30 tablet 11   No current facility-administered medications for this visit.    Facility-Administered Medications Ordered in Other Visits  Medication Dose Route Frequency Provider Last Rate Last Dose  . 0.9 %  sodium chloride infusion   Intravenous Once Truitt Merle, MD      . carfilzomib (KYPROLIS) 90 mg in dextrose 5 % 100 mL chemo infusion  56 mg/m2 (Treatment Plan Recorded) Intravenous Once Truitt Merle, MD      . dexamethasone (DECADRON) injection 8 mg  8 mg Intravenous Once Truitt Merle, MD      . heparin lock flush 100 unit/mL  500 Units Intracatheter Once PRN Truitt Merle, MD      . palonosetron Leona Carry) injection 0.25 mg  0.25 mg Intravenous Once Truitt Merle, MD      . prochlorperazine (COMPAZINE) tablet 10 mg  10 mg Oral Once Truitt Merle, MD      . sodium chloride 0.9 % injection 10 mL  10 mL Intravenous PRN Truitt Merle, MD   10 mL at 12/11/15 1532  . sodium  chloride 0.9 % injection 10 mL  10 mL Intravenous PRN Truitt Merle, MD   10 mL at 06/11/16 1505  . sodium chloride 0.9 % injection 10 mL  10 mL Intravenous PRN Truitt Merle, MD   10 mL at 08/04/17 1643  . sodium chloride flush (NS) 0.9 % injection 10 mL  10 mL Intravenous PRN Truitt Merle, MD   10 mL at 10/09/15 1717  . sodium chloride flush (NS) 0.9 % injection 10 mL  10 mL Intracatheter PRN Truitt Merle, MD   10 mL at 09/27/18 1228  . sodium chloride flush (NS) 0.9 % injection 10 mL  10 mL Intracatheter PRN Truitt Merle, MD        PHYSICAL EXAMINATION: ECOG PERFORMANCE STATUS: 1 - Symptomatic but completely ambulatory  Vitals:   10/12/18 1100  BP: 130/79  Pulse: 62  Resp: 18  Temp: 98.4 F (36.9 C)  SpO2: 100%   Filed Weights   10/12/18 1100  Weight: 144 lb 0.3 oz (65.3 kg)    GENERAL:alert, no distress and comfortable SKIN: no rash  EYES:  sclera clear OROPHARYNX:no thrush or ulcers LUNGS: clear to auscultation with normal breathing effort HEART: regular rate & rhythm, no lower extremity edema ABDOMEN:abdomen soft, non-tender and normal bowel sounds Musculoskeletal:no cyanosis of digits  NEURO: alert & oriented x 3 with fluent speech, normal gait PAC without erythema   LABORATORY DATA:  I have reviewed the data as listed CBC Latest Ref Rng & Units 10/12/2018 09/27/2018 09/13/2018  WBC 4.0 - 10.5 K/uL 3.4(L) 4.4 3.8(L)  Hemoglobin 12.0 - 15.0 g/dL 10.5(L) 10.9(L) 10.8(L)  Hematocrit 36.0 - 46.0 % 33.1(L) 34.7(L) 34.6(L)  Platelets 150 - 400 K/uL 186 279 182     CMP Latest Ref Rng & Units 10/12/2018 09/27/2018 09/13/2018  Glucose 70 - 99 mg/dL 106(H) 91 113(H)  BUN 6 - 20 mg/dL _0 Creatinine 0.44 - 1.00 mg/dL 0.75 0.76 0.83  Sodium 135 - 145 mmol/L 140 140 141  Potassium 3.5 - 5.1 mmol/L 3.7 4.1 3.8  Chloride 98 - 111 mmol/L 107 108 106  CO2 22 - 32 mmol/L _1 Calcium 8.9 - 10.3 mg/dL 9.2 9.2 9.6  Total Protein 6.5 - 8.1 g/dL 6.8 6.9 6.9  Total Bilirubin 0.3 - 1.2 mg/dL  0.3 0.2(L) 0.3  Alkaline Phos 38 - 126 U/L 73 92 77  AST 15 - 41 U/L _2 ALT 0 - 44 U/L <_3 RADIOGRAPHIC STUDIES: I have personally reviewed the radiological images as listed and agreed with the findings in the report. No results found.   ASSESSMENT & PLAN: Sheryl [name] [id number]   1. 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 -She was diagnosed in 10/2014. She was treated with CyborD and bone marrow transplant. She progressed on maintenance Velcade in 02/2016. She was treated with more Dara and CyborD, which was stopped after a severe Hep B flare. -She is currently being treatedwith Carfilzomib and Pomalyst.She is tolerating well overall. -Shewas offPomalyst for 2-3 monthsfor insurance/refill issueand restarted Pomalystin 03/2018. [facility] to [facility] to [facility] for her to pick up. -[facility]. Dr. Norma Fredrickson reviewed her biopsy and considered her to be in remission.This was previously reviewed by Dr. Burr Medico -M protein has been negative, light chains WNL; her leukemia is still in remission -she continues pomalyst 3 weeks on/1 week off and kyprolison days 1 and 15 -she was recently seen by Dr. Tiana Loft at [facility]; after discussion with transplant specialist and Dr. Burr Medico she declined transplant for now and would rather continue current treatment plan  2.Cervical radiculopathy -Her MRI Spine from 03/09/18 showedmild cervical disk bulging, but overall benign. This is likely nerve related. -she wasevaluated by neurologist Dr. Mickeal Skinner in our cancer center, and received a 5 days of steroids, much improved now. -she has generalized body acheson treatment,more in her neck recently especially at work. I encouraged her to use heat and tylenol PRN. -she denies pain today, other than intermittent mild headache that resolves with tylenol PRN   3.Chronichepatitis B infection -Shehas knownchronichepatitis B infection,developed significant transaminitis and hyperbilirubinemiainJanuary 2019 -followed by ID, I recently reached out to Dr. Baxter Flattery regarding f/u but have not heard back -on Viread 300  mg daily  4. Type 2 diabetes, steroids induced -Previouslytreated with Metformin.No longer ondexamethasone. -resolved  5. GERD, history of GI bleeding and nausea -Colonoscopy on 11/26/16 per Dr. Ardis Hughs notable for a polyp in the transverse colon, revealed to be tubular adenoma and negative for high grade dysplasia or malignancy.  -continues PPI daily  -she previously had intermittent nausea after infusion, managed well with oral anti-emetics -resolved lately   Disposition: Ms. Naves appears unchanged. She continues pomalyst 3 weeks on and 1 week off and kyprolis q2 weeks. She tolerates treatment well except fatigue and intermittent headache. She remains functional. Labs reviewed, ANC 1.6 today, we reviewed infection precautions. CMP unremarkable. MM panel still pending. She will proceed with IV Carfilzomib today, continue q2 weeks. She will restart Pomalyst next week on 9/14 which will be refilled today. She will call if she experiences worsening shaking chills, fever, respiratory symptoms, or other new concerns. F/u in 2 weeks with next treatment.   All questions were answered. The patient knows to call the clinic with any problems, questions or concerns. No barriers to learning was detected.     Alla Feeling, NP 10/12/18

## 2018-10-13 ENCOUNTER — Telehealth: Payer: Self-pay | Admitting: Nurse Practitioner

## 2018-10-13 ENCOUNTER — Telehealth: Payer: Self-pay

## 2018-10-13 LAB — MULTIPLE MYELOMA PANEL, SERUM
Albumin SerPl Elph-Mcnc: 3.5 g/dL (ref 2.9–4.4)
Albumin/Glob SerPl: 1.3 (ref 0.7–1.7)
Alpha 1: 0.2 g/dL (ref 0.0–0.4)
Alpha2 Glob SerPl Elph-Mcnc: 0.8 g/dL (ref 0.4–1.0)
B-Globulin SerPl Elph-Mcnc: 0.8 g/dL (ref 0.7–1.3)
Gamma Glob SerPl Elph-Mcnc: 1.1 g/dL (ref 0.4–1.8)
Globulin, Total: 2.9 g/dL (ref 2.2–3.9)
IgA: 227 mg/dL (ref 87–352)
IgG (Immunoglobin G), Serum: 1275 mg/dL (ref 586–1602)
IgM (Immunoglobulin M), Srm: 34 mg/dL (ref 26–217)
Total Protein ELP: 6.4 g/dL (ref 6.0–8.5)

## 2018-10-13 LAB — KAPPA/LAMBDA LIGHT CHAINS
Kappa free light chain: 44.4 mg/L — ABNORMAL HIGH (ref 3.3–19.4)
Kappa, lambda light chain ratio: 0.74 (ref 0.26–1.65)
Lambda free light chains: 59.8 mg/L — ABNORMAL HIGH (ref 5.7–26.3)

## 2018-10-13 NOTE — Telephone Encounter (Signed)
Scheduled appt per 9/8 los.

## 2018-10-13 NOTE — Telephone Encounter (Signed)
Prescription for Pomalst faxed to [place] [facility] with [employer] authorization number [id number] Fax # [phone] sent to [facility] to scan

## 2018-10-15 ENCOUNTER — Telehealth: Payer: Self-pay

## 2018-10-15 NOTE — Telephone Encounter (Signed)
Called and spoke with patient's daughter to let her know we have her Pomalyst here for her to pick up.   I have locked in the cabinet above the nurses desk in [place] [address].

## 2018-10-19 ENCOUNTER — Other Ambulatory Visit: Payer: Self-pay | Admitting: Nurse Practitioner

## 2018-10-19 ENCOUNTER — Telehealth: Payer: Self-pay

## 2018-10-19 DIAGNOSIS — B181 Chronic viral hepatitis B without delta-agent: Secondary | ICD-10-CM

## 2018-10-19 MED FILL — OMEPRAZOLE 20 MG CAP: 20 | 30 days supply | Qty: 30 | Fill #3

## 2018-10-19 MED FILL — ACYCLOVIR 800 MG TABLET: 800 | 30 days supply | Qty: 60 | Fill #2

## 2018-10-19 NOTE — Telephone Encounter (Signed)
Patient picked up Pomalyst today at 8:35 signed packing slip acknowledging she picked it up, sent to [facility] for scan to chart.

## 2018-10-20 NOTE — Telephone Encounter (Signed)
Refill request

## 2018-10-21 NOTE — Progress Notes (Signed)
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   10/10/2014 Miscellaneous   Peripheral blood chemistry and leukocytosis with total white count 78K, comprised of large plasma cells and his normocytic anemia. There is a myeloid left shift with previous surgical radium blasts. Flow cytometry showed 64% plasma cells   10/10/2014 Bone Marrow Biopsy   Markedly hypercellular marrow (95%), Atypical plasma cells comprise 57% of the cellularity. There was diminished multilineage in hematopoiesis with adequate maturation. Breasts less than 1%), no overt dysplasia of the myeloid or erythroid lineages.    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   05/07/2015 - 05/12/2015 Hospital Admission   [facility], [facility]. ID  workup was negative, EGD showed evidence of gastritis and duodenitis, no H. pylori or CMV.   05/20/2015 - 05/24/2015 Hospital Ad[facility]   [facility]   07/11/2015 Bone Marrow Biopsy   Post transplant 100 a bone marrow biopsy showed hypocellular marrow, 20%, no increase in plasma cells (2%) or other abnormalities.   08/22/2015 - 01/02/2016 Chemotherapy   MaintenanceCyborD (cytoxan 3033mm2 iv, bortezomib 1.5 mg/m, dexamethasone 40 mg, every 2 weeks, changed to Velcade maintenance after 4 months treatment   01/16/2016 - 04/19/2016 Chemotherapy   Maintenance Velcade 1.3 mg/m every 2 weeks   02/22/2016 Pathology Results   BONE MARROW: Diagnosis Bone Marrow, Aspirate,Biopsy, and Clot, right iliac - NORMOCELLULAR BONE MARROW FOR AGE WITH TRILINEAGE HEMATOPOIESIS. - PLASMACYTOSIS (PLASMA CELLS 12%). - SEE COMMENT. PERIPHERAL BLOOD: - OCCASIONAL CIRCULATING PLASMA CELLS.   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    04/18/2016 - 03/26/2017 Chemotherapy   Daratumumab per protocol  CyBorD every week, cytoxan was held after 04/24/2016 dye to cytopenia and infection  -discontinued due to Hep B flare   05/11/2016 - 05/16/2016 Hospital Admission   Healthcare-associated pneumonia   02/16/2017 - 02/21/2017 Hospital Admission   Admission diagnosis: Abnormal LFT's Additional comments: Assoc diagnoses: Hep B flare, transminitis, dehydration, fever, SIRS   04/06/2017 -  Chemotherapy   Carfilzomib 56 mg/m2 on days 1 and 15(except 2012m2 on C1D1 and C1D2)with dexamethasone 20 mg on same days and pomalidomide 2 mg on days 1-21 of 28 day cycleon 04/13/17, dexa stopped on 07/06/2017 due to her CR  -She restarted her Pomalyst in 03/2018 (off for 2-3 months due to insurance, delivery issues)   08/26/2017 - 08/28/2017 Hospital Admission  Admit date: 08/26/2017 Admission diagnosis: RLL Pneumonia     04/19/2018 Bone Marrow Biopsy   Bone Marrow Biopsy 04/19/18 at [facility] Bone Marrow (BM) and Peripheral Blood (PB) FINAL PATHOLOGIC DIAGNOSIS BONE MARROW: Hypocellular marrow (20%) with trilineage hematopoiesis. No [plasma cell myeloma identified. See comment. PERIPHERAL BLOOD: Mild anemia. No circulating plasma cells seen. See CBC data and comment.       CURRENT THERAPY:  Carfilzomib 56 mg/m2 on days 1 and 15(except 24m/m2 on C1D1 and C1D2)with dexamethasone 20 mg on same days and pomalidomide 2 mg on days 1-21 of 28 day cycleon 04/13/17, dexa stopped on 07/06/2017 due to her CR -Sherestarted herPomalystin 03/2018(off for 2-3 months due to insurance, delivery issues)  INTERVAL HISTORY:  Sheryl [name] is here for a follow up with her interpretor. She notes she is doing well and notes last cycle she had 1 episode of nausea and fatigue for 2 days. She denies fever or chills. She notes after 2-3 days she is able to recover. She still works 3 long shift a week. She is still able to tolerate it. She does still see ID Dr. SBaxter Flatterybut has not been able to contact her. She was last seen since about a year. She notes she received up her Pomalyst last week. She wants to know my opinion on her bone marrow transplant option. It was recommended by specialist and she previously decided not to do it yet.    REVIEW OF SYSTEMS:   Constitutional: Denies fevers, chills or abnormal weight loss Eyes: Denies blurriness of vision Ears, nose, mouth, throat, and face: Denies mucositis or sore throat Respiratory: Denies cough, dyspnea or wheezes Cardiovascular: Denies palpitation, chest discomfort or lower extremity swelling Gastrointestinal:  Denies nausea, heartburn or change in bowel habits Skin: Denies abnormal skin rashes Lymphatics: Denies new lymphadenopathy or easy bruising Neurological:Denies numbness, tingling or new weaknesses Behavioral/Psych: Mood is stable, no new  changes  All other systems were reviewed with the patient and are negative.  MEDICAL HISTORY:  Past Medical History:  Diagnosis Date  . Chills with fever    intermittently since d/c from hospital  . Dysuria-frequency syndrome    w/ pink urine  . GERD (gastroesophageal reflux disease)   . Hepatitis   . History of positive PPD    DX 2011--  CXR DONE NO EVIDENCE  . History of ureter stent   . Hydronephrosis, right   . Neuromuscular disorder (H[facility])    legs numb intermittently  . 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   . Pneumonia   . Right ureteral stone   . Urosepsis 8/14   ad[facility]    SURGICAL HISTORY: Past Surgical History:  Procedure Laterality Date  . CYSTOSCOPY W/ URETERAL STENT PLACEMENT Right 09/25/2012   Procedure: CYSTOSCOPY WITH RETROGRADE PYELOGRAM/URETERAL STENT PLACEMENT;  Surgeon: TAlexis Frock MD;  Location: [facility];  Service: Urology;  Laterality: Right;  . CYSTOSCOPY WITH RETROGRADE PYELOGRAM, URETEROSCOPY AND STENT PLACEMENT Right 10/15/2012   Procedure: CYSTOSCOPY WITH RETROGRADE PYELOGRAM, URETEROSCOPY AND REMOVAL STENT WITH  STENT PLACEMENT;  Surgeon: TAlexis Frock MD;  [facility]  Service: Urology;  Laterality: Right;  . ESOPHAGOGASTRODUODENOSCOPY (EGD) WITH PROPOFOL N/A 11/16/2014   Procedure: ESOPHAGOGASTRODUODENOSCOPY (EGD) WITH PROPOFOL;  Surgeon: DMilus Banister MD;  Location: [facility];  Service: Endoscopy;  Laterality: N/A;  . HOLMIUM LASER APPLICATION Right 97/93/9030  Procedure: HOLMIUM LASER APPLICATION;  Surgeon: TAlexis Frock MD;  [facility]  Service: Urology;  Laterality: Right;  .  LIVER BIOPSY    . OTHER SURGICAL HISTORY Right    removal of ovarian cyst  . removal of uterine cyst     years ago  . RIGHT VATS W/ DRAINAGE PEURAL EFFUSION AND BX'S  10-30-2008    I have reviewed the social history and family history with the patient and they are unchanged from previous note.  ALLERGIES:   has No Known Allergies.  MEDICATIONS:  Current Outpatient Medications  Medication Sig Dispense Refill  . acyclovir (ZOVIRAX) 800 MG tablet Take 1 tablet (800 mg total) by mouth 2 (two) times daily. 60 tablet 3  . feeding supplement, ENSURE ENLIVE, (ENSURE ENLIVE) LIQD Take 237 mLs by mouth 2 (two) times daily between meals. 237 mL 12  . ibuprofen (ADVIL,MOTRIN) 200 MG tablet Take 200 mg by mouth every 6 (six) hours as needed for moderate pain.    . magic mouthwash SOLN Take 5 mLs by mouth 4 (four) times daily as needed for mouth pain. 240 mL 3  . omeprazole (PRILOSEC) 20 MG capsule Take 1 capsule (20 mg total) by mouth daily. 30 capsule 5  . ondansetron (ZOFRAN ODT) 8 MG disintegrating tablet Take 1 tablet (8 mg total) by mouth every 8 (eight) hours as needed for nausea or vomiting. 40 tablet 1  . polyethylene glycol (MIRALAX / GLYCOLAX) packet Take 17 g by mouth daily as needed (constipation). 14 each 1  . pomalidomide (POMALYST) 2 MG capsule Take 1 capsule (2 mg total) by mouth daily. Take with water on days 1-21. Repeat every 28 days. 21 capsule 0  . prochlorperazine (COMPAZINE) 10 MG tablet Take 1 tablet (10 mg total) by mouth every 6 (six) hours as needed for nausea or vomiting. 30 tablet 3   No current facility-administered medications for this visit.    Facility-Administered Medications Ordered in Other Visits  Medication Dose Route Frequency Provider Last Rate Last Dose  . sodium chloride 0.9 % injection 10 mL  10 mL Intravenous PRN Truitt Merle, MD   10 mL at 12/11/15 1532  . sodium chloride 0.9 % injection 10 mL  10 mL Intravenous PRN Truitt Merle, MD   10 mL at 06/11/16 1505  . sodium chloride 0.9 % injection 10 mL  10 mL Intravenous PRN Truitt Merle, MD   10 mL at 08/04/17 1643  . sodium chloride flush (NS) 0.9 % injection 10 mL  10 mL Intravenous PRN Truitt Merle, MD   10 mL at 10/09/15 1717  . sodium chloride flush (NS) 0.9 % injection 10 mL  10 mL Intracatheter PRN Truitt Merle, MD   10 mL at  09/27/18 1228    PHYSICAL EXAMINATION: ECOG PERFORMANCE STATUS: 1 - Symptomatic but completely ambulatory  Vitals:   10/25/18 0920  BP: 112/76  Pulse: 70  Resp: 18  Temp: 98 F (36.7 C)  SpO2: 100%   Filed Weights   10/25/18 0920  Weight: 144 lb 14.4 oz (65.7 kg)    GENERAL:alert, no distress and comfortable SKIN: skin color, texture, turgor are normal, no rashes or significant lesions EYES: normal, Conjunctiva are pink and non-injected, sclera clear  NECK: supple, thyroid normal size, non-tender, without nodularity LYMPH:  no palpable lymphadenopathy in the cervical, axillary  LUNGS: clear to auscultation and percussion with normal breathing effort HEART: regular rate & rhythm and no murmurs and no lower extremity edema ABDOMEN:abdomen soft, non-tender and normal bowel sounds Musculoskeletal:no cyanosis of digits and no clubbing  NEURO: alert & oriented x 3 with fluent speech,  no focal motor/sensory deficits  LABORATORY DATA:  I have reviewed the data as listed CBC Latest Ref Rng & Units 10/25/2018 10/12/2018 09/27/2018  WBC 4.0 - 10.5 K/uL 4.5 3.4(L) 4.4  Hemoglobin 12.0 - 15.0 g/dL 10.7(L) 10.5(L) 10.9(L)  Hematocrit 36.0 - 46.0 % 34.7(L) 33.1(L) 34.7(L)  Platelets 150 - 400 K/uL 300 186 279     CMP Latest Ref Rng & Units 10/25/2018 10/12/2018 09/27/2018  Glucose 70 - 99 mg/dL 98 106(H) 91  BUN 6 - 20 mg/dL '18 16 19  ' Creatinine 0.44 - 1.00 mg/dL 0.71 0.75 0.76  Sodium 135 - 145 mmol/L 141 140 140  Potassium 3.5 - 5.1 mmol/L 3.7 3.7 4.1  Chloride 98 - 111 mmol/L 109 107 108  CO2 22 - 32 mmol/L '25 27 24  ' Calcium 8.9 - 10.3 mg/dL 9.2 9.2 9.2  Total Protein 6.5 - 8.1 g/dL 6.6 6.8 6.9  Total Bilirubin 0.3 - 1.2 mg/dL <0.2(L) 0.3 0.2(L)  Alkaline Phos 38 - 126 U/L 86 73 92  AST 15 - 41 U/L '20 21 26  ' ALT 0 - 44 U/L <6 <6 8      RADIOGRAPHIC STUDIES: I have personally reviewed the radiological images as listed and agreed with the findings in the report. No results  found.   ASSESSMENT & PLAN:  Brandalynn Ofallon is a 48 y.o. female with   1. 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 -She was diagnosed in 10/2014. She was treated with CyborD and bone marrow transplant. She progressed on maintenance Velcade in 02/2016. She was treated with more Dara and CyborD, which was stopped after a severe Hep B flare. -She is currently being treatedwith Carfilzomib and Pomalyst.She is tolerating well overall. -[facility] Dr. Norma Fredrickson reviewed her biopsy and considered her to be in remission. -She previously saw transplant specialist Dr. Nadara Mustard. She has concern about her risk and overall benefits of procedure. At this point she has declined the transplant and would rather continue current treatment. -Labs reviewed, CBC and CMP WNL except Hg 10.7.  MM panel from last week shows stable M-protein. Overall adequate to proceed withIVCarfilzomibtoday. -Continue Pomalyst -F/u with NP Lacie in 4 weeks and f/u with me in 8 weeks  -She opted to proceed with flu shot today. She is at increased risk of infection I strongly encouraged her to take extra precautions at work.   2.Cervical radiculopathy -Ongoingsince late 12/2017 butgot worsein early 2020 -She denies history of neck trauma or injury -Her MRI Spine from 03/09/18 showedmild cervical disk bulging, but overall benign. This is likely nerve related. -she wasevaluated by neurologist Dr. Mickeal Skinner in our cancer center, and received a 5 days of steroids, near resolved now.Off pain meds or gabapentin  3.Chronichepatitis B infection -Shehas knownchronichepatitis B infection,developed significant transaminitis and hyperbilirubinemiainJanuary 2019 -Sherestartedher Heb Btreatmentin 02/2017, controlled now -She has not been see by Dr. Baxter Flattery in about 1 year. I gave her contact information to contact and set up  appointment.   4. Type 2 diabetes, steroids induced -Previouslytreated with Metformin.No longer ondexamethasone. -Has much improvedand stable. Better controlled lately.  5. GERD, history of GI bleeding and nausea -Colonoscopy on 11/26/16 per Dr. Ardis Hughs notable for a polyp in the transverse colon, revealed to be tubular adenoma and negative for high grade dysplasia or malignancy.  -Continue omeprazole daily. We previously discussed steroids can worsen her acid reflux -Nauseacurrently minimal.   Plan -Flu shot today  -Labs reviewed and adequate to  proceed with IVCarfilzomibtoday -Continue Pomalyst -I refilled viread for a month, and will send a message to Dr. Baxter Flattery for her f/u appointment  -Lab, flush, and IVCarfilzomib every 2 weeks  -F/u in 4 weeks     No problem-specific Assessment & Plan notes found for this encounter.   No orders of the defined types were placed in this encounter.  All questions were answered. The patient knows to call the clinic with any problems, questions or concerns. No barriers to learning was detected. I spent 20 minutes counseling the patient face to face. The total time spent in the appointment was 25 minutes and more than 50% was on counseling and review of test results     Truitt Merle, MD 10/25/2018   I, Joslyn Devon, am acting as scribe for Truitt Merle, MD.   I have reviewed the above documentation for accuracy and completeness, and I agree with the above.

## 2018-10-25 ENCOUNTER — Inpatient Hospital Stay: Payer: Medicaid Other

## 2018-10-25 ENCOUNTER — Other Ambulatory Visit: Payer: Self-pay

## 2018-10-25 ENCOUNTER — Inpatient Hospital Stay (HOSPITAL_BASED_OUTPATIENT_CLINIC_OR_DEPARTMENT_OTHER): Payer: Medicaid Other | Admitting: Hematology

## 2018-10-25 ENCOUNTER — Telehealth: Payer: Self-pay | Admitting: Hematology

## 2018-10-25 ENCOUNTER — Encounter: Payer: Self-pay | Admitting: Hematology

## 2018-10-25 VITALS — BP 112/76 | HR 70 | Temp 98.0°F | Resp 18 | Ht 62.0 in | Wt 144.9 lb

## 2018-10-25 DIAGNOSIS — B181 Chronic viral hepatitis B without delta-agent: Secondary | ICD-10-CM | POA: Diagnosis not present

## 2018-10-25 DIAGNOSIS — Z5112 Encounter for antineoplastic immunotherapy: Secondary | ICD-10-CM | POA: Diagnosis not present

## 2018-10-25 DIAGNOSIS — Z23 Encounter for immunization: Secondary | ICD-10-CM | POA: Diagnosis not present

## 2018-10-25 DIAGNOSIS — C9012 Plasma cell leukemia in relapse: Secondary | ICD-10-CM

## 2018-10-25 DIAGNOSIS — C9010 Plasma cell leukemia not having achieved remission: Secondary | ICD-10-CM

## 2018-10-25 DIAGNOSIS — Z95828 Presence of other vascular implants and grafts: Secondary | ICD-10-CM

## 2018-10-25 LAB — CBC WITH DIFFERENTIAL (CANCER CENTER ONLY)
Abs Immature Granulocytes: 0.07 10*3/uL (ref 0.00–0.07)
Basophils Absolute: 0.1 10*3/uL (ref 0.0–0.1)
Basophils Relative: 1 %
Eosinophils Absolute: 0.1 10*3/uL (ref 0.0–0.5)
Eosinophils Relative: 3 %
HCT: 34.7 % — ABNORMAL LOW (ref 36.0–46.0)
Hemoglobin: 10.7 g/dL — ABNORMAL LOW (ref 12.0–15.0)
Immature Granulocytes: 2 %
Lymphocytes Relative: 21 %
Lymphs Abs: 1 10*3/uL (ref 0.7–4.0)
MCH: 29.8 pg (ref 26.0–34.0)
MCHC: 30.8 g/dL (ref 30.0–36.0)
MCV: 96.7 fL (ref 80.0–100.0)
Monocytes Absolute: 0.4 10*3/uL (ref 0.1–1.0)
Monocytes Relative: 8 %
Neutro Abs: 2.9 10*3/uL (ref 1.7–7.7)
Neutrophils Relative %: 65 %
Platelet Count: 300 10*3/uL (ref 150–400)
RBC: 3.59 MIL/uL — ABNORMAL LOW (ref 3.87–5.11)
RDW: 14.4 % (ref 11.5–15.5)
WBC Count: 4.5 10*3/uL (ref 4.0–10.5)
nRBC: 0 % (ref 0.0–0.2)

## 2018-10-25 LAB — CMP (CANCER CENTER ONLY)
ALT: <6 U/L (ref 0–44)
AST: 20 U/L (ref 15–41)
Albumin: 3.4 g/dL — ABNORMAL LOW (ref 3.5–5.0)
Alkaline Phosphatase: 86 U/L (ref 38–126)
Anion gap: 7 (ref 5–15)
BUN: 18 mg/dL (ref 6–20)
CO2: 25 mmol/L (ref 22–32)
Calcium: 9.2 mg/dL (ref 8.9–10.3)
Chloride: 109 mmol/L (ref 98–111)
Creatinine: 0.71 mg/dL (ref 0.44–1.00)
GFR, Est AFR Am: >60 mL/min (ref >60)
GFR, Estimated: >60 mL/min (ref >60)
Glucose, Bld: 98 mg/dL (ref 70–99)
Potassium: 3.7 mmol/L (ref 3.5–5.1)
Sodium: 141 mmol/L (ref 135–145)
Total Bilirubin: <0.2 mg/dL — ABNORMAL LOW (ref 0.3–1.2)
Total Protein: 6.6 g/dL (ref 6.5–8.1)

## 2018-10-25 LAB — PREGNANCY, URINE: Preg Test, Ur: NEGATIVE

## 2018-10-25 MED ORDER — PROCHLORPERAZINE MALEATE 10 MG PO TABS
10.0000 mg | ORAL_TABLET | Freq: Once | ORAL | Status: AC
  Administered 2018-10-25: 10 mg via ORAL

## 2018-10-25 MED ORDER — PALONOSETRON HCL INJECTION 0.25 MG/5ML
0.2500 mg | Freq: Once | INTRAVENOUS | Status: AC
  Administered 2018-10-25: 0.25 mg via INTRAVENOUS

## 2018-10-25 MED ORDER — DEXTROSE 5 % IV SOLN
56.0000 mg/m2 | Freq: Once | INTRAVENOUS | Status: AC
  Administered 2018-10-25: 90 mg via INTRAVENOUS
  Filled 2018-10-25: qty 30

## 2018-10-25 MED ORDER — INFLUENZA VAC SPLIT QUAD 0.5 ML IM SUSY
0.5000 mL | PREFILLED_SYRINGE | Freq: Once | INTRAMUSCULAR | Status: AC
  Administered 2018-10-25: 0.5 mL via INTRAMUSCULAR

## 2018-10-25 MED ORDER — SODIUM CHLORIDE 0.9% FLUSH
10.0000 mL | Freq: Once | INTRAVENOUS | Status: AC
  Administered 2018-10-25: 10 mL
  Filled 2018-10-25: qty 10

## 2018-10-25 MED ORDER — SODIUM CHLORIDE 0.9 % IV SOLN
Freq: Once | INTRAVENOUS | Status: AC
  Administered 2018-10-25: 10:00:00 via INTRAVENOUS
  Filled 2018-10-25: qty 250

## 2018-10-25 MED ORDER — VIREAD 300 MG PO TABS: 300.0000 mg | ORAL_TABLET | Freq: Every day | ORAL | 0 refills | Status: DC

## 2018-10-25 MED ORDER — DEXAMETHASONE SODIUM PHOSPHATE 10 MG/ML IJ SOLN
INTRAMUSCULAR | Status: AC
  Filled 2018-10-25: qty 1

## 2018-10-25 MED ORDER — DEXAMETHASONE SODIUM PHOSPHATE 10 MG/ML IJ SOLN
8.0000 mg | Freq: Once | INTRAMUSCULAR | Status: AC
  Administered 2018-10-25: 8 mg via INTRAVENOUS

## 2018-10-25 MED ORDER — PROCHLORPERAZINE MALEATE 10 MG PO TABS
ORAL_TABLET | ORAL | Status: AC
  Filled 2018-10-25: qty 1

## 2018-10-25 MED ORDER — SODIUM CHLORIDE 0.9% FLUSH
10.0000 mL | INTRAVENOUS | Status: DC | PRN
  Administered 2018-10-25: 10 mL
  Filled 2018-10-25: qty 10

## 2018-10-25 MED ORDER — PALONOSETRON HCL INJECTION 0.25 MG/5ML
INTRAVENOUS | Status: AC
  Filled 2018-10-25: qty 5

## 2018-10-25 MED ORDER — HEPARIN SOD (PORK) LOCK FLUSH 100 UNIT/ML IV SOLN
500.0000 [IU] | Freq: Once | INTRAVENOUS | Status: AC | PRN
  Administered 2018-10-25: 12:00:00 500 [IU]
  Filled 2018-10-25: qty 5

## 2018-10-25 MED ORDER — INFLUENZA VAC SPLIT QUAD 0.5 ML IM SUSY
PREFILLED_SYRINGE | INTRAMUSCULAR | Status: AC
  Filled 2018-10-25: qty 0.5

## 2018-10-25 NOTE — Patient Instructions (Signed)
Cancer Center Discharge Instructions for Patients Receiving Chemotherapy  Today you received the following chemotherapy agents: Carfilzomib (Kyprolis)  To help prevent nausea and vomiting after your treatment, we encourage you to take your nausea medication as directed.    If you develop nausea and vomiting that is not controlled by your nausea medication, call the clinic.   BELOW ARE SYMPTOMS THAT SHOULD BE REPORTED IMMEDIATELY:  *FEVER GREATER THAN 100.5 F  *CHILLS WITH OR WITHOUT FEVER  NAUSEA AND VOMITING THAT IS NOT CONTROLLED WITH YOUR NAUSEA MEDICATION  *UNUSUAL SHORTNESS OF BREATH  *UNUSUAL BRUISING OR BLEEDING  TENDERNESS IN MOUTH AND THROAT WITH OR WITHOUT PRESENCE OF ULCERS  *URINARY PROBLEMS  *BOWEL PROBLEMS  UNUSUAL RASH Items with * indicate a potential emergency and should be followed up as soon as possible.  Feel free to call the clinic should you have any questions or concerns. The clinic phone number is [phone].  Please show the CHEMO ALERT CARD at check-in to the Emergency Department and triage nurse.  Coronavirus (COVID-19) Are you at risk?  Are you at risk for the Coronavirus (COVID-19)?  To be considered HIGH RISK for Coronavirus (COVID-19), you have to meet the following criteria:  . Traveled to [place], [place], [place], [place] or [place]; or in the [place] to [place], [place], [place], or [place]; and have fever, cough, and shortness of breath within the last 2 weeks of travel OR . Been in close contact with a person diagnosed with COVID-19 within the last 2 weeks and have fever, cough, and shortness of breath . IF YOU DO NOT MEET THESE CRITERIA, YOU ARE CONSIDERED LOW RISK FOR COVID-19.  What to do if you are HIGH RISK for COVID-19?  . If you are having a medical emergency, call 911. . Seek medical care right away. Before you go to a doctor's office, urgent care or emergency department, call ahead and tell them  about your recent travel, contact with someone diagnosed with COVID-19, and your symptoms. You should receive instructions from your physician's office regarding next steps of care.  . When you arrive at healthcare provider, tell the healthcare staff immediately you have returned from visiting [place], [place], [place], [place] or [place]; or traveled in the [place] to [place], [place], [place], or [place]; in the last two weeks or you have been in close contact with a person diagnosed with COVID-19 in the last 2 weeks.   . Tell the health care staff about your symptoms: fever, cough and shortness of breath. . After you have been seen by a medical provider, you will be either: o Tested for (COVID-19) and discharged home on quarantine except to seek medical care if symptoms worsen, and asked to  - Stay home and avoid contact with others until you get your results (4-5 days)  - Avoid travel on public transportation if possible (such as bus, train, or airplane) or o Sent to the Emergency Department by EMS for evaluation, COVID-19 testing, and possible admission depending on your condition and test results.  What to do if you are LOW RISK for COVID-19?  Reduce your risk of any infection by using the same precautions used for avoiding the common cold or flu:  . Wash your hands often with soap and warm water for at least 20 seconds.  If soap and water are not readily available, use an alcohol-based hand sanitizer with at least 60% alcohol.  . If coughing   or sneezing, cover your mouth and nose by coughing or sneezing into the elbow areas of your shirt or coat, into a tissue or into your sleeve (not your hands). . Avoid shaking hands with others and consider head nods or verbal greetings only. . Avoid touching your eyes, nose, or mouth with unwashed hands.  . Avoid close contact with people who are sick. . Avoid places or events with large numbers of people in one location, like concerts or  sporting events. . Carefully consider travel plans you have or are making. . If you are planning any travel outside or inside the [place], visit the CDC's Travelers' Health webpage for the latest health notices. . If you have some symptoms but not all symptoms, continue to monitor at home and seek medical attention if your symptoms worsen. . If you are having a medical emergency, call 911.   ADDITIONAL HEALTHCARE OPTIONS FOR PATIENTS  [facility] / e-Visit: [email or web address]         [facility]: [phone]  [facility]: [phone]                   [facility]: [phone]   

## 2018-10-25 NOTE — Telephone Encounter (Signed)
Scheduled appt per 9/21 los. °

## 2018-10-26 ENCOUNTER — Telehealth: Payer: Self-pay

## 2018-10-26 ENCOUNTER — Other Ambulatory Visit: Payer: Self-pay | Admitting: Nurse Practitioner

## 2018-10-26 DIAGNOSIS — B181 Chronic viral hepatitis B without delta-agent: Secondary | ICD-10-CM

## 2018-10-26 MED ORDER — VIREAD 300 MG PO TABS: 300.0000 mg | ORAL_TABLET | Freq: Every day | ORAL | 1 refills | Status: DC

## 2018-10-26 NOTE — Telephone Encounter (Signed)
Left voicemail on daughter's phone telling her Dr. Burr Medico refilled patient's Viread prescription. Also informed her that Dr. Burr Medico sent a message for Dr. Baxter Flattery to follow up with patient, so they can expect a phone call from Dr. Storm Frisk office in the next couple of weeks. Encouraged her to call our office back if she had any questions or concerns in the meantime.

## 2018-10-26 NOTE — Telephone Encounter (Signed)
-----   Message from Truitt Merle, MD sent at 10/25/2018  9:55 PM EDT ----- Dr. Baxter Flattery,   She has not seen you for a year, her LFTs have been normal lately. I refilled her Viread today, but will defer more refill to you. Could you give her a f/u appointment? Thanks   Katelin, please let her daughter know that I called in to [place].  Thanks   Truitt Merle

## 2018-10-27 ENCOUNTER — Telehealth: Payer: Self-pay | Admitting: Pharmacist

## 2018-10-27 DIAGNOSIS — B181 Chronic viral hepatitis B without delta-agent: Secondary | ICD-10-CM

## 2018-10-27 MED ORDER — TENOFOVIR DISOPROXIL FUMARATE 300 MG PO TABS: 300.0000 mg | ORAL_TABLET | Freq: Every day | ORAL | 1 refills | Status: DC

## 2018-10-27 MED FILL — TENOFOVIR DISOPROXIL FUMARA: 300 | 30 days supply | Qty: 30 | Fill #0

## 2018-10-27 NOTE — Addendum Note (Signed)
Addended by: Darletta Moll on: 10/27/2018 10:57 AM   Modules accepted: Orders

## 2018-10-27 NOTE — Telephone Encounter (Addendum)
Received call from pharmacist at [facility] regarding patient's Viread prescription. Patient hasn't been seen here since March 2019 and is need of an appointment with Dr. Baxter Flattery. Patient was using funds that the cancer center helped her with but is running out. Upon investigation, patient has Medicaid and will not need patient assistance.  Will send new script over so that it doesn't have to be brand.

## 2018-11-03 ENCOUNTER — Other Ambulatory Visit: Payer: Self-pay

## 2018-11-03 DIAGNOSIS — C9012 Plasma cell leukemia in relapse: Secondary | ICD-10-CM

## 2018-11-03 DIAGNOSIS — B181 Chronic viral hepatitis B without delta-agent: Secondary | ICD-10-CM

## 2018-11-08 ENCOUNTER — Inpatient Hospital Stay: Payer: Medicaid Other

## 2018-11-08 ENCOUNTER — Other Ambulatory Visit: Payer: Self-pay

## 2018-11-08 ENCOUNTER — Telehealth: Payer: Self-pay

## 2018-11-08 ENCOUNTER — Inpatient Hospital Stay: Payer: Medicaid Other | Attending: Hematology

## 2018-11-08 VITALS — BP 122/89 | HR 64 | Temp 97.6°F | Resp 18

## 2018-11-08 DIAGNOSIS — Z9484 Stem cells transplant status: Secondary | ICD-10-CM | POA: Insufficient documentation

## 2018-11-08 DIAGNOSIS — C9011 Plasma cell leukemia in remission: Secondary | ICD-10-CM | POA: Insufficient documentation

## 2018-11-08 DIAGNOSIS — Z95828 Presence of other vascular implants and grafts: Secondary | ICD-10-CM

## 2018-11-08 DIAGNOSIS — C9012 Plasma cell leukemia in relapse: Secondary | ICD-10-CM

## 2018-11-08 DIAGNOSIS — Z5112 Encounter for antineoplastic immunotherapy: Secondary | ICD-10-CM | POA: Insufficient documentation

## 2018-11-08 DIAGNOSIS — C9010 Plasma cell leukemia not having achieved remission: Secondary | ICD-10-CM

## 2018-11-08 DIAGNOSIS — Z79899 Other long term (current) drug therapy: Secondary | ICD-10-CM | POA: Diagnosis not present

## 2018-11-08 DIAGNOSIS — M5412 Radiculopathy, cervical region: Secondary | ICD-10-CM | POA: Diagnosis not present

## 2018-11-08 DIAGNOSIS — J189 Pneumonia, unspecified organism: Secondary | ICD-10-CM | POA: Diagnosis present

## 2018-11-08 DIAGNOSIS — B181 Chronic viral hepatitis B without delta-agent: Secondary | ICD-10-CM

## 2018-11-08 LAB — CMP (CANCER CENTER ONLY)
ALT: 7 U/L (ref 0–44)
AST: 24 U/L (ref 15–41)
Albumin: 3.4 g/dL — ABNORMAL LOW (ref 3.5–5.0)
Alkaline Phosphatase: 88 U/L (ref 38–126)
Anion gap: 6 (ref 5–15)
BUN: 14 mg/dL (ref 6–20)
CO2: 26 mmol/L (ref 22–32)
Calcium: 8.9 mg/dL (ref 8.9–10.3)
Chloride: 108 mmol/L (ref 98–111)
Creatinine: 0.69 mg/dL (ref 0.44–1.00)
GFR, Est AFR Am: >60 mL/min (ref >60)
GFR, Estimated: >60 mL/min (ref >60)
Glucose, Bld: 81 mg/dL (ref 70–99)
Potassium: 4.1 mmol/L (ref 3.5–5.1)
Sodium: 140 mmol/L (ref 135–145)
Total Bilirubin: 0.3 mg/dL (ref 0.3–1.2)
Total Protein: 6.9 g/dL (ref 6.5–8.1)

## 2018-11-08 LAB — CBC WITH DIFFERENTIAL (CANCER CENTER ONLY)
Abs Immature Granulocytes: 0.01 10*3/uL (ref 0.00–0.07)
Basophils Absolute: 0.1 10*3/uL (ref 0.0–0.1)
Basophils Relative: 2 %
Eosinophils Absolute: 0.1 10*3/uL (ref 0.0–0.5)
Eosinophils Relative: 4 %
HCT: 35.3 % — ABNORMAL LOW (ref 36.0–46.0)
Hemoglobin: 11.1 g/dL — ABNORMAL LOW (ref 12.0–15.0)
Immature Granulocytes: 0 %
Lymphocytes Relative: 27 %
Lymphs Abs: 1 10*3/uL (ref 0.7–4.0)
MCH: 30.1 pg (ref 26.0–34.0)
MCHC: 31.4 g/dL (ref 30.0–36.0)
MCV: 95.7 fL (ref 80.0–100.0)
Monocytes Absolute: 0.6 10*3/uL (ref 0.1–1.0)
Monocytes Relative: 16 %
Neutro Abs: 1.9 10*3/uL (ref 1.7–7.7)
Neutrophils Relative %: 51 %
Platelet Count: 195 10*3/uL (ref 150–400)
RBC: 3.69 MIL/uL — ABNORMAL LOW (ref 3.87–5.11)
RDW: 13.7 % (ref 11.5–15.5)
WBC Count: 3.6 10*3/uL — ABNORMAL LOW (ref 4.0–10.5)
nRBC: 0 % (ref 0.0–0.2)

## 2018-11-08 LAB — PREGNANCY, URINE: Preg Test, Ur: NEGATIVE

## 2018-11-08 MED ORDER — PALONOSETRON HCL INJECTION 0.25 MG/5ML
0.2500 mg | Freq: Once | INTRAVENOUS | Status: AC
  Administered 2018-11-08: 0.25 mg via INTRAVENOUS

## 2018-11-08 MED ORDER — HEPARIN SOD (PORK) LOCK FLUSH 100 UNIT/ML IV SOLN
500.0000 [IU] | Freq: Once | INTRAVENOUS | Status: AC | PRN
  Administered 2018-11-08: 12:00:00 500 [IU]
  Filled 2018-11-08: qty 5

## 2018-11-08 MED ORDER — SODIUM CHLORIDE 0.9 % IV SOLN
Freq: Once | INTRAVENOUS | Status: AC
  Administered 2018-11-08: 10:00:00 via INTRAVENOUS
  Filled 2018-11-08: qty 250

## 2018-11-08 MED ORDER — INFLUENZA VAC SPLIT QUAD 0.5 ML IM SUSY
PREFILLED_SYRINGE | INTRAMUSCULAR | Status: AC
  Filled 2018-11-08: qty 0.5

## 2018-11-08 MED ORDER — SODIUM CHLORIDE 0.9% FLUSH
10.0000 mL | Freq: Once | INTRAVENOUS | Status: AC
  Administered 2018-11-08: 10 mL
  Filled 2018-11-08: qty 10

## 2018-11-08 MED ORDER — DEXAMETHASONE SODIUM PHOSPHATE 10 MG/ML IJ SOLN
8.0000 mg | Freq: Once | INTRAMUSCULAR | Status: AC
  Administered 2018-11-08: 8 mg via INTRAVENOUS

## 2018-11-08 MED ORDER — DEXAMETHASONE SODIUM PHOSPHATE 10 MG/ML IJ SOLN
INTRAMUSCULAR | Status: AC
  Filled 2018-11-08: qty 1

## 2018-11-08 MED ORDER — PALONOSETRON HCL INJECTION 0.25 MG/5ML
INTRAVENOUS | Status: AC
  Filled 2018-11-08: qty 5

## 2018-11-08 MED ORDER — SODIUM CHLORIDE 0.9% FLUSH
10.0000 mL | INTRAVENOUS | Status: DC | PRN
  Administered 2018-11-08: 12:00:00 10 mL
  Filled 2018-11-08: qty 10

## 2018-11-08 MED ORDER — PROCHLORPERAZINE MALEATE 10 MG PO TABS
10.0000 mg | ORAL_TABLET | Freq: Once | ORAL | Status: AC
  Administered 2018-11-08: 10:00:00 10 mg via ORAL

## 2018-11-08 MED ORDER — POMALIDOMIDE 2 MG PO CAPS: 2.0000 mg | ORAL_CAPSULE | Freq: Every day | ORAL | 0 refills | Status: DC

## 2018-11-08 MED ORDER — DEXTROSE 5 % IV SOLN
56.0000 mg/m2 | Freq: Once | INTRAVENOUS | Status: AC
  Administered 2018-11-08: 11:00:00 90 mg via INTRAVENOUS
  Filled 2018-11-08: qty 30

## 2018-11-08 MED ORDER — PROCHLORPERAZINE MALEATE 10 MG PO TABS
ORAL_TABLET | ORAL | Status: AC
  Filled 2018-11-08: qty 1

## 2018-11-08 NOTE — Telephone Encounter (Signed)
Spoke with [facility] by [employer] to arrange shipment, spoke with pharmacist to review checklist, then took Patient Survey through [employer].  All total took approximately one hour. Shipment will go out for delivery 10/7.

## 2018-11-08 NOTE — Patient Instructions (Addendum)
[facility] Discharge Instructions for Patients Receiving Chemotherapy  Today you received the following chemotherapy agents:  Kyprolis  To help prevent nausea and vomiting after your treatment, we encourage you to take your nausea medication as directed.   If you develop nausea and vomiting that is not controlled by your nausea medication, call the clinic.   BELOW ARE SYMPTOMS THAT SHOULD BE REPORTED IMMEDIATELY:  *FEVER GREATER THAN 100.5 F  *CHILLS WITH OR WITHOUT FEVER  NAUSEA AND VOMITING THAT IS NOT CONTROLLED WITH YOUR NAUSEA MEDICATION  *UNUSUAL SHORTNESS OF BREATH  *UNUSUAL BRUISING OR BLEEDING  TENDERNESS IN MOUTH AND THROAT WITH OR WITHOUT PRESENCE OF ULCERS  *URINARY PROBLEMS  *BOWEL PROBLEMS  UNUSUAL RASH Items with * indicate a potential emergency and should be followed up as soon as possible.  Feel free to call the clinic should you have any questions or concerns. The clinic phone number is [phone].  Please show the CHEMO ALERT CARD at check-in to the Emergency Department and triage nurse.   

## 2018-11-08 NOTE — Telephone Encounter (Signed)
Faxed script for [employer] and [employer] authorization to [facility] by [employer].

## 2018-11-09 ENCOUNTER — Telehealth: Payer: Self-pay

## 2018-11-09 LAB — KAPPA/LAMBDA LIGHT CHAINS
Kappa free light chain: 43.4 mg/L — ABNORMAL HIGH (ref 3.3–19.4)
Kappa, lambda light chain ratio: 0.8 (ref 0.26–1.65)
Lambda free light chains: 54.1 mg/L — ABNORMAL HIGH (ref 5.7–26.3)

## 2018-11-09 NOTE — Telephone Encounter (Signed)
Spoke with patient's daughter [name] and let her know that her Pomalyst is here for her to pick. She states she will let her know.  We have locked it in the cabinet in POD above nurses desk.

## 2018-11-10 LAB — MULTIPLE MYELOMA PANEL, SERUM
Albumin SerPl Elph-Mcnc: 3.4 g/dL (ref 2.9–4.4)
Albumin/Glob SerPl: 1.1 (ref 0.7–1.7)
Alpha 1: 0.2 g/dL (ref 0.0–0.4)
Alpha2 Glob SerPl Elph-Mcnc: 0.8 g/dL (ref 0.4–1.0)
B-Globulin SerPl Elph-Mcnc: 0.9 g/dL (ref 0.7–1.3)
Gamma Glob SerPl Elph-Mcnc: 1.2 g/dL (ref 0.4–1.8)
Globulin, Total: 3.1 g/dL (ref 2.2–3.9)
IgA: 233 mg/dL (ref 87–352)
IgG (Immunoglobin G), Serum: 1324 mg/dL (ref 586–1602)
IgM (Immunoglobulin M), Srm: 33 mg/dL (ref 26–217)
Total Protein ELP: 6.5 g/dL (ref 6.0–8.5)

## 2018-11-11 ENCOUNTER — Telehealth: Payer: Self-pay

## 2018-11-11 NOTE — Telephone Encounter (Signed)
-----   Message from Truitt Merle, MD sent at 11/10/2018 11:04 PM EDT ----- Please let pt know her M-protein was negative 2 days ago, no concerns, thanks   Truitt Merle  11/10/2018

## 2018-11-11 NOTE — Telephone Encounter (Signed)
Called patient's daughter to let her know patient's M-protein was negative. Daughter verbalized understanding and stated patient would be by tomorrow morning to pick up Pomalidomide [facility]) prescription. Denied any other needs at this time

## 2018-11-15 ENCOUNTER — Telehealth: Payer: Self-pay

## 2018-11-15 NOTE — Telephone Encounter (Signed)
Patient picked up Pomalyst and signed packing slip acknowledging receipt.  This was sent to HIM for scanning to chart.

## 2018-11-21 NOTE — Progress Notes (Addendum)
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   10/10/2014 Miscellaneous   Peripheral blood chemistry and leukocytosis with total white count 78K, comprised of large plasma cells and his normocytic anemia. There is a myeloid left shift with previous surgical radium blasts. Flow cytometry showed 64% plasma cells   10/10/2014 Bone Marrow Biopsy   Markedly hypercellular marrow (95%), Atypical plasma cells comprise 57% of the cellularity. There was diminished multilineage in hematopoiesis with adequate maturation. Breasts less than 1%), no overt dysplasia of the myeloid or erythroid lineages.    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   05/07/2015 - 05/12/2015 Hospital Admission   [facility], [facility]. ID workup was negative, EGD  showed evidence of gastritis and duodenitis, no H. pylori or CMV.   05/20/2015 - 05/24/2015 Hospital Admission   [facility]   07/11/2015 Bone Marrow Biopsy   Post transplant 100 a bone marrow biopsy showed hypocellular marrow, 20%, no increase in plasma cells (2%) or other abnormalities.   08/22/2015 - 01/02/2016 Chemotherapy   MaintenanceCyborD (cytoxan 3018mm2 iv, bortezomib 1.5 mg/m, dexamethasone 40 mg, every 2 weeks, changed to Velcade maintenance after 4 months treatment   01/16/2016 - 04/19/2016 Chemotherapy   Maintenance Velcade 1.3 mg/m every 2 weeks   02/22/2016 Pathology Results   BONE MARROW: Diagnosis Bone Marrow, Aspirate,Biopsy, and Clot, right iliac - NORMOCELLULAR BONE MARROW FOR AGE WITH TRILINEAGE HEMATOPOIESIS. - PLASMACYTOSIS (PLASMA CELLS 12%). - SEE COMMENT. PERIPHERAL BLOOD: - OCCASIONAL CIRCULATING PLASMA CELLS.   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    04/18/2016 - 03/26/2017 Chemotherapy   Daratumumab per protocol  CyBorD every week, cytoxan was held after 04/24/2016 dye to cytopenia and infection  -discontinued due to Hep B flare   05/11/2016 - 05/16/2016 Hospital Admission   Healthcare-associated pneumonia   02/16/2017 - 02/21/2017 Hospital Admission   Admission diagnosis: Abnormal LFT's Additional comments: Assoc diagnoses: Hep B flare, transminitis, dehydration, fever, SIRS   04/06/2017 -  Chemotherapy   Carfilzomib 56 mg/m2 on days 1 and 15(except 2089m2 on C1D1 and C1D2)with dexamethasone 20 mg on same days and pomalidomide 2 mg on days 1-21 of 28 day cycleon 04/13/17, dexa stopped on 07/06/2017 due to her CR  -She restarted her Pomalyst in 03/2018 (off for 2-3 months due to insurance, delivery issues)   08/26/2017 - 08/28/2017 Hospital Admission   Admit date: 08/26/2017  Admission diagnosis: RLL Pneumonia    04/19/2018 Bone Marrow Biopsy    Bone Marrow Biopsy 04/19/18 at [facility] Bone Marrow (BM) and Peripheral Blood (PB) FINAL PATHOLOGIC DIAGNOSIS BONE MARROW: Hypocellular marrow (20%) with trilineage hematopoiesis. No [plasma cell myeloma identified. See comment. PERIPHERAL BLOOD: Mild anemia. No circulating plasma cells seen. See CBC data and comment.      CURRENT THERAPY: Carfilzomib 56 mg/m2 on days 1 and 15(except 26m/m2 on C1D1 and C1D2)with dexamethasone 20 mg on same days and pomalidomide 2 mg on days 1-21 of 28 day cycleon 04/13/17, dexa stopped on 07/06/2017 due to her CR -Sherestarted herPomalystin 03/2018(off for 2-3 months due to insurance, delivery issues)  INTERVAL HISTORY: Ms. HDelavegareturns for f/u and treatment as scheduled. She completed Kyprolis on 11/08/18. She continues Pomalyst which she started one week ago. Since last infusion 2 weeks ago she has worsening shortness of breath, very tired [name] in her chest and has to work hard to breath. This feels different from her normal fatigue after treatment. Denies chest pain. Has stiffness in her lower legs which is not new, no edema. Three days ago she developed a cough with yellow phlegm. Has some burning in the throat. She had to work hard to sing in choir at church. She wears a mask. No known sick contacts. No fever or chills. She states she gets a respiratory infection once per year. She is fatigued but remains able to work, do ADLs, and housework. She does not rest often. Appetite is normal. Denies n/v/c/d. Her chronic aches are stable. Denies neuropathy or pain. She has not heard from Dr. SBaxter Flattery    MEDICAL HISTORY:  Past Medical History:  Diagnosis Date  . Chills with fever    intermittently since d/c from hospital  . Dysuria-frequency syndrome    w/ pink urine  . GERD (gastroesophageal reflux disease)   . Hepatitis   . History of positive PPD    DX 2011--  CXR DONE NO EVIDENCE  . History of ureter stent   .  Hydronephrosis, right   . Neuromuscular disorder (HCC)    legs numb intermittently  . 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   . Pneumonia   . Right ureteral stone   . Urosepsis 8/14   ad[facility]    SURGICAL HISTORY: Past Surgical History:  Procedure Laterality Date  . CYSTOSCOPY W/ URETERAL STENT PLACEMENT Right 09/25/2012   Procedure: CYSTOSCOPY WITH RETROGRADE PYELOGRAM/URETERAL STENT PLACEMENT;  Surgeon: TAlexis Frock MD;  Location: [facility];  Service: Urology;  Laterality: Right;  . CYSTOSCOPY WITH RETROGRADE PYELOGRAM, URETEROSCOPY AND STENT PLACEMENT Right 10/15/2012   Procedure: CYSTOSCOPY WITH RETROGRADE PYELOGRAM, URETEROSCOPY AND REMOVAL STENT WITH  STENT PLACEMENT;  Surgeon: TAlexis Frock MD;  [facility]  Service: Urology;  Laterality: Right;  . ESOPHAGOGASTRODUODENOSCOPY (EGD) WITH PROPOFOL N/A 11/16/2014   Procedure: ESOPHAGOGASTRODUODENOSCOPY (EGD) WITH PROPOFOL;  Surgeon: DMilus Banister MD;  Location: [facility];  Service: Endoscopy;  Laterality: N/A;  . HOLMIUM LASER APPLICATION Right 95/04/5463  Procedure: HOLMIUM LASER APPLICATION;  Surgeon: TAlexis Frock MD;  [facility]  Service: Urology;  Laterality: Right;  . LIVER BIOPSY    . OTHER SURGICAL HISTORY Right    removal of ovarian cyst  . removal of uterine cyst     years ago  . RIGHT VATS W/ DRAINAGE PEURAL EFFUSION AND BX'S  10-30-2008    I have reviewed the social history and family history with the patient and they  are unchanged from previous note.  ALLERGIES:  has No Known Allergies.  MEDICATIONS:  Current Outpatient Medications  Medication Sig Dispense Refill  . acyclovir (ZOVIRAX) 800 MG tablet Take 1 tablet (800 mg total) by mouth 2 (two) times daily. 60 tablet 3  . feeding supplement, ENSURE ENLIVE, (ENSURE ENLIVE) LIQD Take 237 mLs by mouth 2 (two) times daily between meals. 237 mL 12  . ibuprofen (ADVIL,MOTRIN) 200 MG tablet Take 200 mg by mouth  every 6 (six) hours as needed for moderate pain.    . magic mouthwash SOLN Take 5 mLs by mouth 4 (four) times daily as needed for mouth pain. 240 mL 3  . omeprazole (PRILOSEC) 20 MG capsule Take 1 capsule (20 mg total) by mouth daily. 30 capsule 5  . ondansetron (ZOFRAN ODT) 8 MG disintegrating tablet Take 1 tablet (8 mg total) by mouth every 8 (eight) hours as needed for nausea or vomiting. 40 tablet 1  . polyethylene glycol (MIRALAX / GLYCOLAX) packet Take 17 g by mouth daily as needed (constipation). 14 each 1  . pomalidomide (POMALYST) 2 MG capsule Take 1 capsule (2 mg total) by mouth daily. Take with water on days 1-21. Repeat every 28 days. 21 capsule 0  . prochlorperazine (COMPAZINE) 10 MG tablet Take 1 tablet (10 mg total) by mouth every 6 (six) hours as needed for nausea or vomiting. 30 tablet 3  . tenofovir (VIREAD) 300 MG tablet Take 1 tablet (300 mg total) by mouth daily. 30 tablet 1  . amoxicillin-clavulanate (AUGMENTIN) 875-125 MG tablet Take 1 tablet by mouth 2 (two) times daily. 14 tablet 0  . aspirin 81 MG chewable tablet Chew 81 mg by mouth daily.    Marland Kitchen azithromycin (ZITHROMAX) 500 MG tablet Take 1 tablet (500 mg total) by mouth daily. 3 tablet 0   No current facility-administered medications for this visit.    Facility-Administered Medications Ordered in Other Visits  Medication Dose Route Frequency Provider Last Rate Last Dose  . heparin lock flush 100 UNIT/ML injection           . sodium chloride (PF) 0.9 % injection           . sodium chloride 0.9 % injection 10 mL  10 mL Intravenous PRN Truitt Merle, MD   10 mL at 12/11/15 1532  . sodium chloride 0.9 % injection 10 mL  10 mL Intravenous PRN Truitt Merle, MD   10 mL at 06/11/16 1505  . sodium chloride 0.9 % injection 10 mL  10 mL Intravenous PRN Truitt Merle, MD   10 mL at 08/04/17 1643  . sodium chloride flush (NS) 0.9 % injection 10 mL  10 mL Intravenous PRN Truitt Merle, MD   10 mL at 10/09/15 1717  . sodium chloride flush (NS) 0.9  % injection 10 mL  10 mL Intracatheter PRN Truitt Merle, MD   10 mL at 09/27/18 1228  . sodium chloride flush (NS) 0.9 % injection 10 mL  10 mL Intracatheter PRN Truitt Merle, MD   10 mL at 11/22/18 1608    PHYSICAL EXAMINATION: ECOG PERFORMANCE STATUS: 1 - Symptomatic but completely ambulatory  Vitals:   11/22/18 0909  BP: 124/80  Pulse: 78  Resp: 18  Temp: 97.8 F (36.6 C)  SpO2: 100%   Filed Weights   11/22/18 0909  Weight: 147 lb 3.2 oz (66.8 kg)    GENERAL:alert, no distress and comfortable SKIN: no rash  EYES: sclera clear OROPHARYNX: no thrush, ulcers, or oropharyngeal  erythema  LYMPH:  no palpable cervical or supraclavicular lymphadenopathy LUNGS: clear with normal breathing effort, no wheezes.  HEART: regular rate & rhythm, no lower extremity edema ABDOMEN: abdomen soft, non-tender and normal bowel sounds Musculoskeletal:no cyanosis of digits  NEURO: alert & oriented x 3 with fluent speech PAC without erythema   LABORATORY DATA:  I have reviewed the data as listed CBC Latest Ref Rng & Units 11/22/2018 11/08/2018 10/25/2018  WBC 4.0 - 10.5 K/uL 4.8 3.6(L) 4.5  Hemoglobin 12.0 - 15.0 g/dL 11.1(L) 11.1(L) 10.7(L)  Hematocrit 36.0 - 46.0 % 35.0(L) 35.3(L) 34.7(L)  Platelets 150 - 400 K/uL 345 195 300     CMP Latest Ref Rng & Units 11/22/2018 11/08/2018 10/25/2018  Glucose 70 - 99 mg/dL 86 81 98  BUN 6 - 20 mg/dL '19 14 18  ' Creatinine 0.44 - 1.00 mg/dL 0.72 0.69 0.71  Sodium 135 - 145 mmol/L 142 140 141  Potassium 3.5 - 5.1 mmol/L 4.1 4.1 3.7  Chloride 98 - 111 mmol/L 107 108 109  CO2 22 - 32 mmol/L '25 26 25  ' Calcium 8.9 - 10.3 mg/dL 9.3 8.9 9.2  Total Protein 6.5 - 8.1 g/dL 6.9 6.9 6.6  Total Bilirubin 0.3 - 1.2 mg/dL 0.2(L) 0.3 <0.2(L)  Alkaline Phos 38 - 126 U/L 95 88 86  AST 15 - 41 U/L '25 24 20  ' ALT 0 - 44 U/L 8 7 <6      RADIOGRAPHIC STUDIES: I have personally reviewed the radiological images as listed and agreed with the findings in the report. Dg Chest  2 View  Result Date: 11/22/2018 CLINICAL DATA:  Cough and dyspnea. History of plasma cell leukemia. EXAM: CHEST - 2 VIEW COMPARISON:  01/13/2018. FINDINGS: Normal sized heart. Stable linear density in the right middle lobe with out the previously seen patchy density in the lateral projection. Stable right jugular porta catheter and minimal scoliosis. IMPRESSION: Stable probable changes of bronchiectasis in the right middle lobe with resolution of previously associated minimal patchy density. No acute abnormality. Electronically Signed   By: Claudie Revering M.D.   On: 11/22/2018 11:14   Ct Angio Chest Pe W Or Wo Contrast  Result Date: 11/22/2018 CLINICAL DATA:  Cough and shortness of breath.  Elevated D-dimer. History of the PP EXAM: CT ANGIOGRAPHY CHEST WITH CONTRAST TECHNIQUE: Multidetector CT imaging of the chest was performed using the standard protocol during bolus administration of intravenous contrast. Multiplanar CT image reconstructions and MIPs were obtained to evaluate the vascular anatomy. CONTRAST:  162m OMNIPAQUE IOHEXOL 350 MG/ML SOLN COMPARISON:  Chest CT October 23 2008; chest radiograph November 22, 2018 FINDINGS: Cardiovascular: There is no demonstrable pulmonary embolus. There is no thoracic aortic aneurysm or dissection. The visualized great vessels appear unremarkable. Note that the left vertebral artery arises directly from the aortic arch, an anatomic variant. There is no pericardial effusion or pericardial thickening. Port-A-Cath tip is at the cavoatrial junction. Mediastinum/Nodes: Thyroid appears unremarkable. There is no appreciable thoracic adenopathy. No esophageal lesions are evident. Lungs/Pleura: There is patchy ground-glass type opacity in each lower lobe. There is atelectatic change in the lower lobes, inferior lingula, and right middle lobe. No frank consolidation or pleural effusion. Upper Abdomen: There is loculated fluid anterior and lateral to the spleen. There is  hepatic steatosis. Visualized upper abdominal structures otherwise appear unremarkable. Musculoskeletal: There is an apparent bone island in the inferior scapula on the right, stable. No other sclerotic areas noted. No lytic or destructive bone lesions. No chest wall  lesions are evident. Review of the MIP images confirms the above findings. IMPRESSION: 1. No demonstrable pulmonary embolus. No thoracic aortic aneurysm or dissection. 2. Ground-glass type opacity in each lung base. Concern for atypical organism pneumonia in the lung bases. Scattered areas of atelectatic change noted bilaterally. No frank consolidation. 3.  No evident adenopathy. 4. Loculated ascites anterior and lateral to the spleen. Infected fluid in this area cannot be excluded. 5.  Hepatic steatosis. Electronically Signed   By: Lowella Grip III M.D.   On: 11/22/2018 14:31     ASSESSMENT & PLAN: Jamecia Hdokis [id number]   1. Atypical pneumonia -She developed dyspnea 2 weeks ago after last treatment, and productive cough began 3 days ago.  -Afebrile; no known sick contacts. Adheres to [place] precautions but does go to church -VSS overall, however she did desat to 90% on RA during ambulation today in clinic  -Chest xray showed stable linear density in the right middle lobe, no definite evidence of effusion or infection. -D-Dimer today was slightly elevated to 0.55 -Given her history of malignancy on chemotherapy and desaturation, she was referred for stat CTA chest to r/o PE today.  -She returned for treatment while the report was pending and received carfilzomib.  -CTA is negative for PE but does show ground-glass opacity in each lung base, concerning for atypical organism pneumonia which is likely the source for dyspnea -Will cover with Augmentin and Azithromycin. She will return for O2 check on 10/22. If she does not improve, may require hospitalization for IV antibiotics.  -I recommend to hold Pomalyst until infection  resolves.  -I will try to reach Dr. Baxter Flattery again with this information.  -The patient was seen with Dr. Burr Medico  2. 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 -She was diagnosed in 10/2014. She was treated with CyborD and bone marrow transplant. She progressed on maintenance Velcade in 02/2016. She was treated with more Dara and CyborD, which was stopped after a severe Hep B flare. -She is currently being treatedwith Carfilzomib and Pomalyst.She is tolerating well overall. -Shewas offPomalyst for 2-3 monthsfor insurance/refill issueand restarted Pomalystin 03/2018. [facility] to pick [facility]. -[facility]. Norma Fredrickson reviewed her biopsy and considered her to be in remission.This was previously reviewed by Dr. Burr Medico -M protein has been negative, light chains WNL; her leukemia is still in remission -she continues pomalyst 3 weeks on/1 week off and kyprolison days 1 and 15 -she was seen by transplant specialist and ultimately declined transplant for now and would rather continue current treatment plan -She continues pomalyst on days 1-21 q28 days and carfilzomib on days 1 and 15 every 28 days.  -S/p cycle 20 day 1 carfilzomib, she tolerates treatment well overall -last M-protein remains undetectable -CBC and CMP unremarkable  -She received another dose of Carfilzomib today  3.Cervical radiculopathy -Her MRI Spine from 03/09/18 showedmild cervical disk bulging, but overall benign. This is likely nerve related. -she wasevaluated by neurologist Dr. Mickeal Skinner in our cancer center, and received a 5 days of steroids, much improved now. -she has generalized body acheson treatment,more in her neck recently especially at work. I encouraged her to use heat and tylenol PRN. -stable overall   4.Chronichepatitis B infection -Shehas knownchronichepatitis B  infection,developed significant transaminitis and hyperbilirubinemiainJanuary 2019 -followed by ID, I recently reached out to Dr. Baxter Flattery regarding f/u but have not heard back -on Viread 300 mg daily  5. Type  2 diabetes, steroids induced -Previouslytreated with Metformin.No longer ondexamethasone. -resolved  6. GERD, history of GI bleeding and nausea -Colonoscopy on 11/26/16 per Dr. Ardis Hughs notable for a polyp in the transverse colon, revealed to be tubular adenoma and negative for high grade dysplasia or malignancy.  -continues PPI daily  -she previously had intermittent nausea after infusion, managed well with oral anti-emetics -resolved lately   PLAN: -Labs, chest xray, CTA reviewed -Proceed with cycle 20 day 15 Carfilzomib today -Hold Pomalyst for infection -Begin oral antibiotics Augmentin and Zithromax for atypical pneumonia -Cc note to Dr. Baxter Flattery  -F/u 10/22 with O2 check    Orders Placed This Encounter  Procedures  . DG Chest 2 View    Standing Status:   Future    Number of Occurrences:   1    Standing Expiration Date:   11/22/2019    Order Specific Question:   Reason for Exam (SYMPTOM  OR DIAGNOSIS REQUIRED)    Answer:   dyspnea, cough    Order Specific Question:   Is patient pregnant?    Answer:   No    Order Specific Question:   Preferred imaging location?    Answer:   [facility]    Order Specific Question:   Radiology Contrast Protocol - do NOT remove file path    Answer:   \\[email or web address]\[email or web address]\DXFluoroContrastProtocols.pdf  . CT ANGIO CHEST PE W OR WO CONTRAST    Standing Status:   Future    Number of Occurrences:   1    Standing Expiration Date:   02/22/2020    Order Specific Question:   ** REASON FOR EXAM (FREE TEXT)    Answer:   cough, dyspnea, on chemo, elevated d-dimer r/o PE    Order Specific Question:   If indicated for the ordered procedure, I authorize the administration of contrast media per Radiology protocol    Answer:    Yes    Order Specific Question:   Is patient pregnant?    Answer:   No    Order Specific Question:   Preferred imaging location?    Answer:   [facility]    Order Specific Question:   Call Results- Best Contact Number?    Answer:   [phone]- pt will go back to ca ctr    Order Specific Question:   Radiology Contrast Protocol - do NOT remove file path    Answer:   \\[email or web address]\Radiant\CTProtocols.pdf  . D-dimer, quantitative    Patient in room 27 med onc office    Standing Status:   Future    Number of Occurrences:   1    Standing Expiration Date:   11/22/2019   All questions were answered. The patient knows to call the clinic with any problems, questions or concerns. No barriers to learning was detected. I spent 30 minutes counseling the patient face to face. The total time spent in the appointment was 40 minutes and more than 50% was on counseling and review of test results     Sheryl Feeling, NP 11/22/18   Addendum  I have seen the patient, examined her. I agree with the assessment and and plan and have edited the notes.   Pt reports dyspnea on exertion for about 2 weeks with associated fatigue, no fever or productive cough. She desated to 90% after walking in clinic. CXR this morning was negative. I was concerned about PE, and CTA was ordered. Initial impression was non-infectious, so we decided to proceed with chemo today.  CTA was done in the afternoon, I personally reviewed it and discussed with pt. It was negative for PE, but showed groundglass type opacity in each lung base, which is likely the cause of her dyspnea and hypoxia.  This is concerning for atypical pneumonia or drug induced penumonitis (Kyprolis), including virus or other opportunistic infection.  Her clinical presentation is not typical for bacterial pneumonia. Will start her on Augmentin and azithromycin, and check her again in 3 days to see if she clinically improves, and make decision if further  workup such as bronchoscopy is needed. I will hold her Pomalyst for now, and rule out COVID tomorrow. Pt knows to call us or go to ED if she has worsening dyspnea, fever or other new symptoms.   Truitt Merle  11/22/2018

## 2018-11-22 ENCOUNTER — Encounter: Payer: Self-pay | Admitting: Nurse Practitioner

## 2018-11-22 ENCOUNTER — Ambulatory Visit (HOSPITAL_COMMUNITY)
Admission: RE | Admit: 2018-11-22 | Discharge: 2018-11-22 | Disposition: A | Discharge status: Home / Self Care | Payer: Medicaid Other | Source: Ambulatory Visit | Attending: Nurse Practitioner | Admitting: Nurse Practitioner

## 2018-11-22 ENCOUNTER — Telehealth: Payer: Self-pay

## 2018-11-22 ENCOUNTER — Encounter (HOSPITAL_COMMUNITY): Payer: Self-pay

## 2018-11-22 ENCOUNTER — Inpatient Hospital Stay: Payer: Medicaid Other

## 2018-11-22 ENCOUNTER — Other Ambulatory Visit: Payer: Self-pay

## 2018-11-22 ENCOUNTER — Inpatient Hospital Stay (HOSPITAL_BASED_OUTPATIENT_CLINIC_OR_DEPARTMENT_OTHER): Payer: Medicaid Other | Admitting: Nurse Practitioner

## 2018-11-22 VITALS — BP 124/80 | HR 78 | Temp 97.8°F | Resp 18 | Ht 62.0 in | Wt 147.2 lb

## 2018-11-22 DIAGNOSIS — R0609 Other forms of dyspnea: Secondary | ICD-10-CM

## 2018-11-22 DIAGNOSIS — J189 Pneumonia, unspecified organism: Secondary | ICD-10-CM | POA: Diagnosis not present

## 2018-11-22 DIAGNOSIS — R0600 Dyspnea, unspecified: Secondary | ICD-10-CM | POA: Insufficient documentation

## 2018-11-22 DIAGNOSIS — Z5112 Encounter for antineoplastic immunotherapy: Secondary | ICD-10-CM | POA: Diagnosis not present

## 2018-11-22 DIAGNOSIS — C9010 Plasma cell leukemia not having achieved remission: Secondary | ICD-10-CM

## 2018-11-22 DIAGNOSIS — Z95828 Presence of other vascular implants and grafts: Secondary | ICD-10-CM

## 2018-11-22 DIAGNOSIS — C9011 Plasma cell leukemia in remission: Secondary | ICD-10-CM | POA: Diagnosis not present

## 2018-11-22 DIAGNOSIS — C9012 Plasma cell leukemia in relapse: Secondary | ICD-10-CM

## 2018-11-22 LAB — CBC WITH DIFFERENTIAL (CANCER CENTER ONLY)
Abs Immature Granulocytes: 0.06 10*3/uL (ref 0.00–0.07)
Basophils Absolute: 0.1 10*3/uL (ref 0.0–0.1)
Basophils Relative: 1 %
Eosinophils Absolute: 0.2 10*3/uL (ref 0.0–0.5)
Eosinophils Relative: 3 %
HCT: 35 % — ABNORMAL LOW (ref 36.0–46.0)
Hemoglobin: 11.1 g/dL — ABNORMAL LOW (ref 12.0–15.0)
Immature Granulocytes: 1 %
Lymphocytes Relative: 21 %
Lymphs Abs: 1 10*3/uL (ref 0.7–4.0)
MCH: 30.2 pg (ref 26.0–34.0)
MCHC: 31.7 g/dL (ref 30.0–36.0)
MCV: 95.4 fL (ref 80.0–100.0)
Monocytes Absolute: 0.5 10*3/uL (ref 0.1–1.0)
Monocytes Relative: 11 %
Neutro Abs: 3 10*3/uL (ref 1.7–7.7)
Neutrophils Relative %: 63 %
Platelet Count: 345 10*3/uL (ref 150–400)
RBC: 3.67 MIL/uL — ABNORMAL LOW (ref 3.87–5.11)
RDW: 13.8 % (ref 11.5–15.5)
WBC Count: 4.8 10*3/uL (ref 4.0–10.5)
nRBC: 0 % (ref 0.0–0.2)

## 2018-11-22 LAB — CMP (CANCER CENTER ONLY)
ALT: 8 U/L (ref 0–44)
AST: 25 U/L (ref 15–41)
Albumin: 3.4 g/dL — ABNORMAL LOW (ref 3.5–5.0)
Alkaline Phosphatase: 95 U/L (ref 38–126)
Anion gap: 10 (ref 5–15)
BUN: 19 mg/dL (ref 6–20)
CO2: 25 mmol/L (ref 22–32)
Calcium: 9.3 mg/dL (ref 8.9–10.3)
Chloride: 107 mmol/L (ref 98–111)
Creatinine: 0.72 mg/dL (ref 0.44–1.00)
GFR, Est AFR Am: >60 mL/min (ref >60)
GFR, Estimated: >60 mL/min (ref >60)
Glucose, Bld: 86 mg/dL (ref 70–99)
Potassium: 4.1 mmol/L (ref 3.5–5.1)
Sodium: 142 mmol/L (ref 135–145)
Total Bilirubin: 0.2 mg/dL — ABNORMAL LOW (ref 0.3–1.2)
Total Protein: 6.9 g/dL (ref 6.5–8.1)

## 2018-11-22 LAB — PREGNANCY, URINE: Preg Test, Ur: NEGATIVE

## 2018-11-22 LAB — D-DIMER, QUANTITATIVE: D-Dimer, Quant: 0.55 ug/mL-FEU — ABNORMAL HIGH (ref 0.00–0.50)

## 2018-11-22 MED ORDER — DEXAMETHASONE SODIUM PHOSPHATE 10 MG/ML IJ SOLN
8.0000 mg | Freq: Once | INTRAMUSCULAR | Status: AC
  Administered 2018-11-22: 8 mg via INTRAVENOUS

## 2018-11-22 MED ORDER — AMOXICILLIN-POT CLAVULANATE 875-125 MG PO TABS: 1.0000 | ORAL_TABLET | Freq: Two times a day (BID) | ORAL | 0 refills | Status: DC

## 2018-11-22 MED ORDER — HEPARIN SOD (PORK) LOCK FLUSH 100 UNIT/ML IV SOLN
500.0000 [IU] | Freq: Once | INTRAVENOUS | Status: AC
  Administered 2018-11-22: 14:00:00 500 [IU] via INTRAVENOUS

## 2018-11-22 MED ORDER — SODIUM CHLORIDE 0.9% FLUSH
10.0000 mL | Freq: Once | INTRAVENOUS | Status: AC
  Administered 2018-11-22: 10 mL
  Filled 2018-11-22: qty 10

## 2018-11-22 MED ORDER — SODIUM CHLORIDE (PF) 0.9 % IJ SOLN
INTRAMUSCULAR | Status: AC
  Filled 2018-11-22: qty 50

## 2018-11-22 MED ORDER — PALONOSETRON HCL INJECTION 0.25 MG/5ML
0.2500 mg | Freq: Once | INTRAVENOUS | Status: AC
  Administered 2018-11-22: 0.25 mg via INTRAVENOUS

## 2018-11-22 MED ORDER — HEPARIN SOD (PORK) LOCK FLUSH 100 UNIT/ML IV SOLN
500.0000 [IU] | Freq: Once | INTRAVENOUS | Status: AC | PRN
  Administered 2018-11-22: 500 [IU]
  Filled 2018-11-22: qty 5

## 2018-11-22 MED ORDER — PROCHLORPERAZINE MALEATE 10 MG PO TABS
ORAL_TABLET | ORAL | Status: AC
  Filled 2018-11-22: qty 1

## 2018-11-22 MED ORDER — PALONOSETRON HCL INJECTION 0.25 MG/5ML
INTRAVENOUS | Status: AC
  Filled 2018-11-22: qty 5

## 2018-11-22 MED ORDER — SODIUM CHLORIDE 0.9% FLUSH
10.0000 mL | INTRAVENOUS | Status: DC | PRN
  Administered 2018-11-22: 10 mL
  Filled 2018-11-22: qty 10

## 2018-11-22 MED ORDER — AZITHROMYCIN 500 MG PO TABS: 500.0000 mg | ORAL_TABLET | Freq: Every day | ORAL | 0 refills | Status: DC

## 2018-11-22 MED ORDER — DEXTROSE 5 % IV SOLN
56.0000 mg/m2 | Freq: Once | INTRAVENOUS | Status: AC
  Administered 2018-11-22: 90 mg via INTRAVENOUS
  Filled 2018-11-22: qty 30

## 2018-11-22 MED ORDER — HEPARIN SOD (PORK) LOCK FLUSH 100 UNIT/ML IV SOLN
INTRAVENOUS | Status: AC
  Filled 2018-11-22: qty 5

## 2018-11-22 MED ORDER — PROCHLORPERAZINE MALEATE 10 MG PO TABS
10.0000 mg | ORAL_TABLET | Freq: Once | ORAL | Status: AC
  Administered 2018-11-22: 10 mg via ORAL

## 2018-11-22 MED ORDER — DEXAMETHASONE SODIUM PHOSPHATE 10 MG/ML IJ SOLN
INTRAMUSCULAR | Status: AC
  Filled 2018-11-22: qty 1

## 2018-11-22 MED ORDER — SODIUM CHLORIDE 0.9 % IV SOLN
Freq: Once | INTRAVENOUS | Status: AC
  Administered 2018-11-22: 15:00:00 via INTRAVENOUS
  Filled 2018-11-22: qty 250

## 2018-11-22 MED ORDER — IOHEXOL 350 MG/ML SOLN
100.0000 mL | Freq: Once | INTRAVENOUS | Status: AC | PRN
  Administered 2018-11-22: 100 mL via INTRAVENOUS

## 2018-11-22 MED FILL — AMOX-CLAV 875-125 MG TABLET: 875-125 | 7 days supply | Qty: 14 | Fill #0

## 2018-11-22 MED FILL — OMEPRAZOLE 20 MG CAP: 20 | 30 days supply | Qty: 30 | Fill #4

## 2018-11-22 MED FILL — TENOFOVIR DISOPROXIL FUMARA: 300 | 30 days supply | Qty: 30 | Fill #1

## 2018-11-22 MED FILL — AZITHROMYCIN 500 MG TABLET: 500 | 3 days supply | Qty: 3 | Fill #0

## 2018-11-22 NOTE — Telephone Encounter (Signed)
Per Cira Rue NP message relayed to infusion Nurse Rogue Jury RN that Pt has Bilateral Pneumonia, RN will relay message to Pt. As well as telling her to discontinue using Pomalyst until further notice.

## 2018-11-22 NOTE — Patient Instructions (Signed)
Hold Pomalyst until Dr Burr Medico decides to restart. Start taking Aspirin 81mg  daily. Return to see Cira Rue on Thursday 10/22 - schedulers will call you with appointment time.    [place] Discharge Instructions for Patients Receiving Chemotherapy  Today you received the following chemotherapy agents:  Carfilzomib  To help prevent nausea and vomiting after your treatment, we encourage you to take your nausea medication as prescribed.   If you develop nausea and vomiting that is not controlled by your nausea medication, call the clinic.   BELOW ARE SYMPTOMS THAT SHOULD BE REPORTED IMMEDIATELY:  *FEVER GREATER THAN 100.5 F  *CHILLS WITH OR WITHOUT FEVER  NAUSEA AND VOMITING THAT IS NOT CONTROLLED WITH YOUR NAUSEA MEDICATION  *UNUSUAL SHORTNESS OF BREATH  *UNUSUAL BRUISING OR BLEEDING  TENDERNESS IN MOUTH AND THROAT WITH OR WITHOUT PRESENCE OF ULCERS  *URINARY PROBLEMS  *BOWEL PROBLEMS  UNUSUAL RASH Items with * indicate a potential emergency and should be followed up as soon as possible.  Feel free to call the clinic should you have any questions or concerns. The clinic phone number is [phone].  Please show the [place] at check-in to the Emergency Department and triage nurse.

## 2018-11-23 ENCOUNTER — Telehealth: Payer: Self-pay

## 2018-11-23 ENCOUNTER — Telehealth: Payer: Self-pay | Admitting: Nurse Practitioner

## 2018-11-23 ENCOUNTER — Other Ambulatory Visit: Payer: Self-pay

## 2018-11-23 DIAGNOSIS — Z20822 Contact with and (suspected) exposure to covid-19: Secondary | ICD-10-CM

## 2018-11-23 NOTE — Telephone Encounter (Signed)
Scheduled appt per 10/19 los. ° °Left a VM of the appt date and time. °

## 2018-11-23 NOTE — Telephone Encounter (Signed)
Spoke with patient's daughter to inform her that per Dr. Burr Medico patient needs to stop taking her Pomalyst prescription d/t her pneumonia, and to try to go get COVID tested at the old [facility] on [address] today. Daughter verbalized understanding and agreement, and stated she will call the office to let us know after COVID testing has been completed today. Denied any other needs at this time.

## 2018-11-23 NOTE — Telephone Encounter (Signed)
-----   Message from Truitt Merle, MD sent at 11/22/2018 10:58 PM EDT ----- Augusto Gamble and I saw her today. Could you call her in the morning and let her stop Pomalyst (due to her pneumonia), and arrange COVID test at [place] tomorrow?  Thanks   Sheryl [name]

## 2018-11-24 LAB — NOVEL CORONAVIRUS, NAA: SARS-CoV-2, NAA: NOT DETECTED

## 2018-11-25 ENCOUNTER — Inpatient Hospital Stay: Payer: Medicaid Other

## 2018-11-25 ENCOUNTER — Inpatient Hospital Stay (HOSPITAL_BASED_OUTPATIENT_CLINIC_OR_DEPARTMENT_OTHER): Payer: Medicaid Other | Admitting: Nurse Practitioner

## 2018-11-25 ENCOUNTER — Other Ambulatory Visit: Payer: Self-pay

## 2018-11-25 VITALS — BP 122/74 | HR 82 | Temp 98.2°F | Resp 17 | Ht 62.0 in | Wt 146.0 lb

## 2018-11-25 DIAGNOSIS — Z5112 Encounter for antineoplastic immunotherapy: Secondary | ICD-10-CM | POA: Diagnosis not present

## 2018-11-25 DIAGNOSIS — J189 Pneumonia, unspecified organism: Secondary | ICD-10-CM | POA: Diagnosis not present

## 2018-11-25 DIAGNOSIS — C9012 Plasma cell leukemia in relapse: Secondary | ICD-10-CM

## 2018-11-25 LAB — CBC WITH DIFFERENTIAL (CANCER CENTER ONLY)
Abs Immature Granulocytes: 0.04 10*3/uL (ref 0.00–0.07)
Basophils Absolute: 0 10*3/uL (ref 0.0–0.1)
Basophils Relative: 1 %
Eosinophils Absolute: 0 10*3/uL (ref 0.0–0.5)
Eosinophils Relative: 1 %
HCT: 35.3 % — ABNORMAL LOW (ref 36.0–46.0)
Hemoglobin: 11.2 g/dL — ABNORMAL LOW (ref 12.0–15.0)
Immature Granulocytes: 1 %
Lymphocytes Relative: 27 %
Lymphs Abs: 1.4 10*3/uL (ref 0.7–4.0)
MCH: 29.9 pg (ref 26.0–34.0)
MCHC: 31.7 g/dL (ref 30.0–36.0)
MCV: 94.1 fL (ref 80.0–100.0)
Monocytes Absolute: 0.5 10*3/uL (ref 0.1–1.0)
Monocytes Relative: 9 %
Neutro Abs: 3.2 10*3/uL (ref 1.7–7.7)
Neutrophils Relative %: 61 %
Platelet Count: 191 10*3/uL (ref 150–400)
RBC: 3.75 MIL/uL — ABNORMAL LOW (ref 3.87–5.11)
RDW: 13.7 % (ref 11.5–15.5)
WBC Count: 5.2 10*3/uL (ref 4.0–10.5)
nRBC: 0 % (ref 0.0–0.2)

## 2018-11-25 NOTE — Progress Notes (Signed)
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   10/10/2014 Miscellaneous   Peripheral blood chemistry and leukocytosis with total white count 78K, comprised of large plasma cells and his normocytic anemia. There is a myeloid left shift with previous surgical radium blasts. Flow cytometry showed 64% plasma cells   10/10/2014 Bone Marrow Biopsy   Markedly hypercellular marrow (95%), Atypical plasma cells comprise 57% of the cellularity. There was diminished multilineage in hematopoiesis with adequate maturation. Breasts less than 1%), no overt dysplasia of the myeloid or erythroid lineages.    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   05/07/2015 - 05/12/2015 Hospital Admission   [facility], [facility]  [facility]. ID workup was negative, EGD showed evidence of gastritis and duodenitis, no H. pylori or CMV.   05/20/2015 - 05/24/2015 Hospital Admission   [facility]   07/11/2015 Bone Marrow Biopsy   Post transplant 100 a bone marrow biopsy showed hypocellular marrow, 20%, no increase in plasma cells (2%) or other abnormalities.   08/22/2015 - 01/02/2016 Chemotherapy   MaintenanceCyborD (cytoxan 3062mm2 iv, bortezomib 1.5 mg/m, dexamethasone 40 mg, every 2 weeks, changed to Velcade maintenance after 4 months treatment   01/16/2016 - 04/19/2016 Chemotherapy   Maintenance Velcade 1.3 mg/m every 2 weeks   02/22/2016 Pathology Results   BONE MARROW: Diagnosis Bone Marrow, Aspirate,Biopsy, and Clot, right iliac - NORMOCELLULAR BONE MARROW FOR AGE WITH TRILINEAGE HEMATOPOIESIS. - PLASMACYTOSIS (PLASMA CELLS 12%). - SEE COMMENT. PERIPHERAL BLOOD: - OCCASIONAL CIRCULATING PLASMA CELLS.   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    04/18/2016 - 03/26/2017 Chemotherapy   Daratumumab per protocol  CyBorD every week, cytoxan was held after 04/24/2016 dye to cytopenia and infection  -discontinued due to Hep B flare   05/11/2016 - 05/16/2016 Hospital Admission   Healthcare-associated pneumonia   02/16/2017 - 02/21/2017 Hospital Admission   Admission diagnosis: Abnormal LFT's Additional comments: Assoc diagnoses: Hep B flare, transminitis, dehydration, fever, SIRS   04/06/2017 -  Chemotherapy   Carfilzomib 56 mg/m2 on days 1 and 15(except 2029m2 on C1D1 and C1D2)with dexamethasone 20 mg on same days and pomalidomide 2 mg on days 1-21 of 28 day cycleon 04/13/17, dexa stopped on 07/06/2017 due to her CR  -She restarted her Pomalyst in 03/2018 (off for 2-3 months due to insurance, delivery issues)   08/26/2017 - 08/28/2017 Hospital Admission  Admit date: 08/26/2017 Admission diagnosis: RLL Pneumonia     04/19/2018 Bone Marrow Biopsy   Bone Marrow Biopsy 04/19/18 at [facility] Bone Marrow (BM) and Peripheral Blood (PB) FINAL PATHOLOGIC DIAGNOSIS BONE MARROW: Hypocellular marrow (20%) with trilineage hematopoiesis. No [plasma cell myeloma identified. See comment. PERIPHERAL BLOOD: Mild anemia. No circulating plasma cells seen. See CBC data and comment.      CURRENT THERAPY:  1. Ongoing chemotherapy for plasma cell leukemia, s/p cycle 20 day 15 carfilzomib on 11/22/18. Holding Pomalyst as of 10/19 for infection 2. Antibiotics Augmentin and Zithromax for atypical infection starting 11/22/18   INTERVAL HISTORY: Ms. Dragon returns for f/u and oxygen check. She completed Carfilzomib on 10/19 and started antibiotics for pneumonia. Early the morning after chemo she felt dizzy, vomited, and had subjective fever which she has had before. Symptoms resolved after 12 hours. She had a negative COVID test on 10/20. She completed Zithromax on 10/21 and continues Augmentin. Dyspnea is a little better, she is still tired. She has not coughed any more. Throat is dry but not sore. She is able to eat and drink well.     MEDICAL HISTORY:  Past Medical History:  Diagnosis Date  . Chills with fever    intermittently since d/c from hospital  . Dysuria-frequency syndrome    w/ pink urine  . GERD (gastroesophageal reflux disease)   . Hepatitis   . History of positive PPD    DX 2011--  CXR DONE NO EVIDENCE  . History of ureter stent   . Hydronephrosis, right   . Neuromuscular disorder (H[facility])    legs numb intermittently  . 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[place])   . Pneumonia   . Right ureteral stone   . Urosepsis 8/14   admitted to [facility]    SURGICAL HISTORY: Past Surgical History:  Procedure Laterality Date  . CYSTOSCOPY W/ URETERAL STENT PLACEMENT Right 09/25/2012   Procedure: CYSTOSCOPY WITH RETROGRADE PYELOGRAM/URETERAL STENT PLACEMENT;  Surgeon: Alexis Frock, MD;  Location: [facility];  Service: Urology;  Laterality: Right;  . CYSTOSCOPY WITH RETROGRADE PYELOGRAM, URETEROSCOPY AND STENT PLACEMENT Right 10/15/2012   Procedure: CYSTOSCOPY WITH RETROGRADE PYELOGRAM, URETEROSCOPY AND REMOVAL STENT WITH  STENT PLACEMENT;  Surgeon: Alexis Frock, MD;  [facility];  Service: Urology;  Laterality: Right;  . ESOPHAGOGASTRODUODENOSCOPY (EGD) WITH PROPOFOL N/A 11/16/2014   Procedure: ESOPHAGOGASTRODUODENOSCOPY (EGD) WITH PROPOFOL;  Surgeon: Milus Banister, MD;  Location: [facility];  Service: Endoscopy;  Laterality: N/A;  . HOLMIUM LASER APPLICATION Right 1/47/8295   Procedure: HOLMIUM LASER APPLICATION;  Surgeon: Alexis Frock, MD;  [facility];  Service: Urology;  Laterality: Right;  . LIVER BIOPSY    . OTHER SURGICAL HISTORY Right    removal of ovarian cyst  . removal of uterine cyst     years ago  . RIGHT VATS W/ DRAINAGE PEURAL EFFUSION AND BX'S  10-30-2008    I have reviewed the social history and family history with the patient and they are unchanged from previous note.  ALLERGIES:  has No Known Allergies.  MEDICATIONS:  Current Outpatient Medications  Medication Sig Dispense Refill  . acyclovir (ZOVIRAX) 800 MG tablet Take 1 tablet (800 mg total) by mouth 2 (two) times daily. 60 tablet 3  . amoxicillin-clavulanate (AUGMENTIN) 875-125 MG tablet Take 1 tablet by mouth 2 (two) times daily. 14 tablet 0  . aspirin 81 MG chewable tablet Chew 81 mg by mouth daily.    Marland Kitchen azithromycin (ZITHROMAX) 500 MG  tablet Take 1 tablet (500 mg total) by mouth daily. 3 tablet 0  . feeding supplement, ENSURE ENLIVE, (ENSURE ENLIVE) LIQD Take 237 mLs by mouth 2 (two) times daily between meals. 237 mL 12  . ibuprofen (ADVIL,MOTRIN) 200 MG tablet Take 200 mg by mouth every 6 (six) hours as needed for moderate pain.    . magic mouthwash SOLN Take 5 mLs by mouth 4 (four) times daily as needed for mouth pain. 240 mL 3  . omeprazole (PRILOSEC)  20 MG capsule Take 1 capsule (20 mg total) by mouth daily. 30 capsule 5  . ondansetron (ZOFRAN ODT) 8 MG disintegrating tablet Take 1 tablet (8 mg total) by mouth every 8 (eight) hours as needed for nausea or vomiting. 40 tablet 1  . polyethylene glycol (MIRALAX / GLYCOLAX) packet Take 17 g by mouth daily as needed (constipation). 14 each 1  . prochlorperazine (COMPAZINE) 10 MG tablet Take 1 tablet (10 mg total) by mouth every 6 (six) hours as needed for nausea or vomiting. 30 tablet 3  . tenofovir (VIREAD) 300 MG tablet Take 1 tablet (300 mg total) by mouth daily. 30 tablet 1  . pomalidomide (POMALYST) 2 MG capsule Take 1 capsule (2 mg total) by mouth daily. Take with water on days 1-21. Repeat every 28 days. (Patient not taking: Reported on 11/25/2018) 21 capsule 0   No current facility-administered medications for this visit.    Facility-Administered Medications Ordered in Other Visits  Medication Dose Route Frequency Provider Last Rate Last Dose  . sodium chloride 0.9 % injection 10 mL  10 mL Intravenous PRN Truitt Merle, MD   10 mL at 12/11/15 1532  . sodium chloride 0.9 % injection 10 mL  10 mL Intravenous PRN Truitt Merle, MD   10 mL at 06/11/16 1505  . sodium chloride 0.9 % injection 10 mL  10 mL Intravenous PRN Truitt Merle, MD   10 mL at 08/04/17 1643  . sodium chloride flush (NS) 0.9 % injection 10 mL  10 mL Intravenous PRN Truitt Merle, MD   10 mL at 10/09/15 1717  . sodium chloride flush (NS) 0.9 % injection 10 mL  10 mL Intracatheter PRN Truitt Merle, MD   10 mL at 09/27/18 1228    PHYSICAL EXAMINATION:  Vitals:   11/25/18 1325  BP: 122/74  Pulse: 82  Resp: 17  Temp: 98.2 F (36.8 C)  SpO2: 100%   Filed Weights   11/25/18 1325  Weight: 146 lb (66.2 kg)    GENERAL:alert, no distress and comfortable SKIN: no rash EYES:  sclera clear LUNGS: clear with normal breathing effort, no wheezing HEART: regular rate & rhythm Musculoskeletal: no edema NEURO: alert & oriented x 3 with fluent  speech, no focal motor/sensory deficits PAC without erythema   LABORATORY DATA:  I have reviewed the data as listed CBC Latest Ref Rng & Units 11/25/2018 11/22/2018 11/08/2018  WBC 4.0 - 10.5 K/uL 5.2 4.8 3.6(L)  Hemoglobin 12.0 - 15.0 g/dL 11.2(L) 11.1(L) 11.1(L)  Hematocrit 36.0 - 46.0 % 35.3(L) 35.0(L) 35.3(L)  Platelets 150 - 400 K/uL 191 345 195     CMP Latest Ref Rng & Units 11/22/2018 11/08/2018 10/25/2018  Glucose 70 - 99 mg/dL 86 81 98  BUN 6 - 20 mg/dL _0 Creatinine 0.44 - 1.00 mg/dL 0.72 0.69 0.71  Sodium 135 - 145 mmol/L 142 140 141  Potassium 3.5 - 5.1 mmol/L 4.1 4.1 3.7  Chloride 98 - 111 mmol/L 107 108 109  CO2 22 - 32 mmol/L _0 Calcium 8.9 - 10.3 mg/dL 9.3 8.9 9.2  Total Protein 6.5 - 8.1 g/dL 6.9 6.9 6.6  Total Bilirubin 0.3 - 1.2 mg/dL 0.2(L) 0.3 <0.2(L)  Alkaline Phos 38 - 126 U/L 95 88 86  AST 15 - 41 U/L _1 ALT 0 - 44 U/L 8 7 <6      RADIOGRAPHIC STUDIES: I have personally reviewed the radiological images as listed and agreed with the findings in the report. No results found.   ASSESSMENT & PLAN: Sheryl [name] [id number]   1. Atypical pneumonia -She developed dyspnea after her treatment 2 weeks agot, and productive cough began last week.  -Afebrile; no known sick contacts. Adheres to [place] precautions but does go to church -on 10/19 VSS overall, however she did desat to 90% on RA during ambulation in clinic  -Chest xray showed stable linear density in the right middle lobe, no definite evidence of effusion or infection. -D-Dimer was slightly elevated to 0.55 -Given her history of malignancy on chemotherapy and desaturation, she was referred for stat CTA chest to r/o PE today.  -She returned for treatment while the report was pending and received carfilzomib.  -CTA was negative for PE but did show ground-glass opacity in each lung base, concerning for atypical organism pneumonia which is felt to be the source for dyspnea  -She completed Azithromycin from 10/19 - 10/21 and continues 7 day course of Augmentin -She is holding Pomalyst   -I attempted to reach Dr. Baxter Flattery on 10/19 with this update, I have not heard back  Disposition:  Mr. Winterton appears stable. She has mild desat to 91% on exertion. Clinically she is doing better with improved dyspnea. Her cough resolved. I encouraged her to take her temperature next time she feels febrile. CBC is reassuring. She is COVID negative. I encouraged her to continue oral antibiotics and observe her symptoms. If she worsens overnight/weekend she will need to come to ED. She will return for lab and f/u next week to closely monitor her improvement. Will check additional labs to r/o atypical/opportunistic infections. I reviewed the plan with Dr. Burr Medico.    Orders Placed This Encounter  Procedures  . CMV antibody, IgG (EIA)    Standing Status:   Standing    Number of Occurrences:   1    Standing Expiration Date:   11/26/2019  . CMV DNA, quantitative, PCR    Standing Status:   Standing    Number of Occurrences:   1    Standing Expiration Date:   11/26/2019  . CMV IgM    Standing Status:   Standing    Number of Occurrences:   1    Standing Expiration Date:   11/26/2019  . Lactate dehydrogenase (LDH)    Standing Status:   Standing    Number of Occurrences:   1    Standing Expiration Date:   11/26/2019   The encounter was facilitated by an in-person interpreter. All questions were answered. The patient knows to call the clinic with any problems, questions or concerns. No barriers to learning was detected. I spent 20 minutes counseling the patient face to face. The total time spent in the appointment was 25 minutes and more than 50% was on counseling and review of test results     Alla Feeling, NP 11/26/18

## 2018-11-26 ENCOUNTER — Telehealth: Payer: Self-pay | Admitting: Nurse Practitioner

## 2018-11-26 ENCOUNTER — Telehealth: Payer: Self-pay

## 2018-11-26 ENCOUNTER — Encounter: Payer: Self-pay | Admitting: Nurse Practitioner

## 2018-11-26 NOTE — Telephone Encounter (Signed)
Oral Oncology Patient Advocate Encounter  Pomalyst patient assistance with [employer] will expire 02/03/19.  The patient now has medicaid and will not be eligible for [employer] in 2021.  The script will be sent to outside pharmacy mid to end of December. [employer] has the ability to bill to medicaid.  The patients daughter is aware and knows to call the office with questions or concerns.  [place] Patient [place] Phone [phone] Fax [phone] 11/26/2018   9:06 AM

## 2018-11-26 NOTE — Telephone Encounter (Signed)
Scheduled appt per 10/22 los.  Called and spoke with the  patient family member and they are aware of the appt date and time.

## 2018-12-02 ENCOUNTER — Inpatient Hospital Stay: Payer: Medicaid Other

## 2018-12-02 ENCOUNTER — Inpatient Hospital Stay (HOSPITAL_BASED_OUTPATIENT_CLINIC_OR_DEPARTMENT_OTHER): Payer: Medicaid Other | Admitting: Nurse Practitioner

## 2018-12-02 ENCOUNTER — Ambulatory Visit (HOSPITAL_COMMUNITY)
Admission: RE | Admit: 2018-12-02 | Discharge: 2018-12-02 | Disposition: A | Discharge status: Home / Self Care | Payer: Medicaid Other | Source: Ambulatory Visit | Attending: Nurse Practitioner | Admitting: Nurse Practitioner

## 2018-12-02 ENCOUNTER — Encounter: Payer: Self-pay | Admitting: Nurse Practitioner

## 2018-12-02 ENCOUNTER — Other Ambulatory Visit: Payer: Self-pay

## 2018-12-02 VITALS — BP 143/80 | HR 85 | Temp 97.6°F | Resp 16 | Ht 62.0 in | Wt 141.5 lb

## 2018-12-02 DIAGNOSIS — J189 Pneumonia, unspecified organism: Secondary | ICD-10-CM

## 2018-12-02 DIAGNOSIS — Z95828 Presence of other vascular implants and grafts: Secondary | ICD-10-CM

## 2018-12-02 DIAGNOSIS — M5412 Radiculopathy, cervical region: Secondary | ICD-10-CM | POA: Diagnosis not present

## 2018-12-02 DIAGNOSIS — C9012 Plasma cell leukemia in relapse: Secondary | ICD-10-CM

## 2018-12-02 DIAGNOSIS — C9011 Plasma cell leukemia in remission: Secondary | ICD-10-CM | POA: Diagnosis not present

## 2018-12-02 DIAGNOSIS — Z5112 Encounter for antineoplastic immunotherapy: Secondary | ICD-10-CM | POA: Diagnosis not present

## 2018-12-02 LAB — CBC WITH DIFFERENTIAL (CANCER CENTER ONLY)
Abs Immature Granulocytes: 0.05 10*3/uL (ref 0.00–0.07)
Basophils Absolute: 0 10*3/uL (ref 0.0–0.1)
Basophils Relative: 0 %
Eosinophils Absolute: 0 10*3/uL (ref 0.0–0.5)
Eosinophils Relative: 0 %
HCT: 37.4 % (ref 36.0–46.0)
Hemoglobin: 11.8 g/dL — ABNORMAL LOW (ref 12.0–15.0)
Immature Granulocytes: 1 %
Lymphocytes Relative: 11 %
Lymphs Abs: 0.8 10*3/uL (ref 0.7–4.0)
MCH: 29.6 pg (ref 26.0–34.0)
MCHC: 31.6 g/dL (ref 30.0–36.0)
MCV: 94 fL (ref 80.0–100.0)
Monocytes Absolute: 0.2 10*3/uL (ref 0.1–1.0)
Monocytes Relative: 3 %
Neutro Abs: 6.3 10*3/uL (ref 1.7–7.7)
Neutrophils Relative %: 85 %
Platelet Count: 207 10*3/uL (ref 150–400)
RBC: 3.98 MIL/uL (ref 3.87–5.11)
RDW: 13.8 % (ref 11.5–15.5)
WBC Count: 7.4 10*3/uL (ref 4.0–10.5)
nRBC: 0 % (ref 0.0–0.2)

## 2018-12-02 LAB — CMP (CANCER CENTER ONLY)
ALT: 6 U/L (ref 0–44)
AST: 17 U/L (ref 15–41)
Albumin: 3.8 g/dL (ref 3.5–5.0)
Alkaline Phosphatase: 83 U/L (ref 38–126)
Anion gap: 11 (ref 5–15)
BUN: 26 mg/dL — ABNORMAL HIGH (ref 6–20)
CO2: 23 mmol/L (ref 22–32)
Calcium: 9.9 mg/dL (ref 8.9–10.3)
Chloride: 109 mmol/L (ref 98–111)
Creatinine: 0.77 mg/dL (ref 0.44–1.00)
GFR, Est AFR Am: >60 mL/min (ref >60)
GFR, Estimated: >60 mL/min (ref >60)
Glucose, Bld: 124 mg/dL — ABNORMAL HIGH (ref 70–99)
Potassium: 4.2 mmol/L (ref 3.5–5.1)
Sodium: 143 mmol/L (ref 135–145)
Total Bilirubin: 0.3 mg/dL (ref 0.3–1.2)
Total Protein: 7.8 g/dL (ref 6.5–8.1)

## 2018-12-02 LAB — LACTATE DEHYDROGENASE: LDH: 162 U/L (ref 98–192)

## 2018-12-02 MED ORDER — SODIUM CHLORIDE 0.9% FLUSH
10.0000 mL | Freq: Once | INTRAVENOUS | Status: AC
  Administered 2018-12-02: 10 mL
  Filled 2018-12-02: qty 10

## 2018-12-02 MED ORDER — HEPARIN SOD (PORK) LOCK FLUSH 100 UNIT/ML IV SOLN
500.0000 [IU] | Freq: Once | INTRAVENOUS | Status: AC
  Administered 2018-12-02: 11:00:00 500 [IU]
  Filled 2018-12-02: qty 5

## 2018-12-02 NOTE — Progress Notes (Signed)
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   10/10/2014 Miscellaneous   Peripheral blood chemistry and leukocytosis with total white count 78K, comprised of large plasma cells and his normocytic anemia. There is a myeloid left shift with previous surgical radium blasts. Flow cytometry showed 64% plasma cells   10/10/2014 Bone Marrow Biopsy   Markedly hypercellular marrow (95%), Atypical plasma cells comprise 57% of the cellularity. There was diminished multilineage in hematopoiesis with adequate maturation. Breasts less than 1%), no overt dysplasia of the myeloid or erythroid lineages.    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   05/07/2015 - 05/12/2015 Hospital Admission   [facility], [facility] ID workup was negative, EGD showed  evidence of gastritis and duodenitis, no H. pylori or CMV.   05/20/2015 - 05/24/2015 Hospital Admission   [facility]   07/11/2015 Bone Marrow Biopsy   Post transplant 100 a bone marrow biopsy showed hypocellular marrow, 20%, no increase in plasma cells (2%) or other abnormalities.   08/22/2015 - 01/02/2016 Chemotherapy   MaintenanceCyborD (cytoxan 3060mm2 iv, bortezomib 1.5 mg/m, dexamethasone 40 mg, every 2 weeks, changed to Velcade maintenance after 4 months treatment   01/16/2016 - 04/19/2016 Chemotherapy   Maintenance Velcade 1.3 mg/m every 2 weeks   02/22/2016 Pathology Results   BONE MARROW: Diagnosis Bone Marrow, Aspirate,Biopsy, and Clot, right iliac - NORMOCELLULAR BONE MARROW FOR AGE WITH TRILINEAGE HEMATOPOIESIS. - PLASMACYTOSIS (PLASMA CELLS 12%). - SEE COMMENT. PERIPHERAL BLOOD: - OCCASIONAL CIRCULATING PLASMA CELLS.   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    04/18/2016 - 03/26/2017 Chemotherapy   Daratumumab per protocol  CyBorD every week, cytoxan was held after 04/24/2016 dye to cytopenia and infection  -discontinued due to Hep B flare   05/11/2016 - 05/16/2016 Hospital Admission   Healthcare-associated pneumonia   02/16/2017 - 02/21/2017 Hospital Admission   Admission diagnosis: Abnormal LFT's Additional comments: Assoc diagnoses: Hep B flare, transminitis, dehydration, fever, SIRS   04/06/2017 -  Chemotherapy   Carfilzomib 56 mg/m2 on days 1 and 15(except 2076m2 on C1D1 and C1D2)with dexamethasone 20 mg on same days and pomalidomide 2 mg on days 1-21 of 28 day cycleon 04/13/17, dexa stopped on 07/06/2017 due to her CR  -She restarted her Pomalyst in 03/2018 (off for 2-3 months due to insurance, delivery issues)   08/26/2017 - 08/28/2017 Hospital Admission   Admit date: 08/26/2017 Admission diagnosis: RLL  Pneumonia    04/19/2018 Bone Marrow Biopsy    Bone Marrow Biopsy 04/19/18 at [facility] Bone Marrow (BM) and Peripheral Blood (PB) FINAL PATHOLOGIC DIAGNOSIS BONE MARROW: Hypocellular marrow (20%) with trilineage hematopoiesis. No [plasma cell myeloma identified. See comment. PERIPHERAL BLOOD: Mild anemia. No circulating plasma cells seen. See CBC data and comment.      CURRENT THERAPY:  1. Ongoing chemotherapy for plasma cell leukemia, s/p cycle 20 day 15 carfilzomib on 11/22/18. Holding Pomalyst as of 10/19 for infection 2. Antibiotics Augmentin and Zithromax for atypical infection starting 11/22/18  INTERVAL HISTORY: Ms. Anand returns for f/u of her pneumonia. She feels better. Dyspnea on exertion is very mild, improving; resolves with rest. Denies cough, fever, or chills. She completed all antibiotics. She has left neck and shoulder pain ongoing since early 2020 but had a flare this week. She took 20 mg dex for 2 days from a previous prescription she had.    MEDICAL HISTORY:  Past Medical History:  Diagnosis Date  . Chills with fever    intermittently since d/c from hospital  . Dysuria-frequency syndrome    w/ pink urine  . GERD (gastroesophageal reflux disease)   . Hepatitis   . History of positive PPD    DX 2011--  CXR DONE NO EVIDENCE  . History of ureter stent   . Hydronephrosis, right   . Neuromuscular disorder (H[facility])    legs numb intermittently  . 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[place])   . Pneumonia   . Right ureteral stone   . Urosepsis 8/14   admitted to [facility]    SURGICAL HISTORY: Past Surgical History:  Procedure Laterality Date  . CYSTOSCOPY W/ URETERAL STENT PLACEMENT Right 09/25/2012   Procedure: CYSTOSCOPY WITH RETROGRADE PYELOGRAM/URETERAL STENT PLACEMENT;  Surgeon: Alexis Frock, MD;  Location: [facility];  Service: Urology;  Laterality: Right;  . CYSTOSCOPY WITH RETROGRADE PYELOGRAM, URETEROSCOPY AND STENT PLACEMENT Right 10/15/2012   Procedure: CYSTOSCOPY WITH RETROGRADE PYELOGRAM,  URETEROSCOPY AND REMOVAL STENT WITH  STENT PLACEMENT;  Surgeon: Alexis Frock, MD;  Location[facility];  Service: Urology;  Laterality: Right;  . ESOPHAGOGASTRODUODENOSCOPY (EGD) WITH PROPOFOL N/A 11/16/2014   Procedure: ESOPHAGOGASTRODUODENOSCOPY (EGD) WITH PROPOFOL;  Surgeon: Milus Banister, MD;  Location: [facility];  Service: Endoscopy;  Laterality: N/A;  . HOLMIUM LASER APPLICATION Right 07/17/4313   Procedure: HOLMIUM LASER APPLICATION;  Surgeon: Alexis Frock, MD;  Location: [facility];  Service: Urology;  Laterality: Right;  . LIVER BIOPSY    . OTHER SURGICAL HISTORY Right    removal of ovarian cyst  . removal of uterine cyst     years ago  . RIGHT VATS W/ DRAINAGE PEURAL EFFUSION AND BX'S  10-30-2008    I have reviewed the social history and family history with the patient and they are unchanged from previous note.  ALLERGIES:  has No Known Allergies.  MEDICATIONS:  Current Outpatient Medications  Medication Sig Dispense Refill  . acyclovir (ZOVIRAX) 800 MG tablet Take 1 tablet (800 mg total) by mouth 2 (two) times daily. 60 tablet 3  . amoxicillin-clavulanate (AUGMENTIN) 875-125 MG tablet Take 1 tablet by mouth 2 (two) times daily. 14 tablet 0  . aspirin 81 MG chewable tablet Chew 81 mg by mouth daily.    Marland Kitchen azithromycin (ZITHROMAX) 500 MG tablet Take 1 tablet (500 mg total) by mouth daily. 3 tablet 0  . feeding supplement, ENSURE ENLIVE, (ENSURE ENLIVE) LIQD Take 237 mLs by mouth 2 (two) times daily between meals.  237 mL 12  . ibuprofen (ADVIL,MOTRIN) 200 MG tablet Take 200 mg by mouth every 6 (six) hours as needed for moderate pain.    . magic mouthwash SOLN Take 5 mLs by mouth 4 (four) times daily as needed for mouth pain. 240 mL 3  . omeprazole (PRILOSEC) 20 MG capsule Take 1 capsule (20 mg total) by mouth daily. 30 capsule 5  . ondansetron (ZOFRAN ODT) 8 MG disintegrating tablet Take 1 tablet (8 mg total) by mouth every 8 (eight) hours  as needed for nausea or vomiting. 40 tablet 1  . polyethylene glycol (MIRALAX / GLYCOLAX) packet Take 17 g by mouth daily as needed (constipation). 14 each 1  . pomalidomide (POMALYST) 2 MG capsule Take 1 capsule (2 mg total) by mouth daily. Take with water on days 1-21. Repeat every 28 days. 21 capsule 0  . prochlorperazine (COMPAZINE) 10 MG tablet Take 1 tablet (10 mg total) by mouth every 6 (six) hours as needed for nausea or vomiting. 30 tablet 3  . tenofovir (VIREAD) 300 MG tablet Take 1 tablet (300 mg total) by mouth daily. 30 tablet 1   No current facility-administered medications for this visit.    Facility-Administered Medications Ordered in Other Visits  Medication Dose Route Frequency Provider Last Rate Last Dose  . sodium chloride 0.9 % injection 10 mL  10 mL Intravenous PRN Truitt Merle, MD   10 mL at 12/11/15 1532  . sodium chloride 0.9 % injection 10 mL  10 mL Intravenous PRN Truitt Merle, MD   10 mL at 06/11/16 1505  . sodium chloride 0.9 % injection 10 mL  10 mL Intravenous PRN Truitt Merle, MD   10 mL at 08/04/17 1643  . sodium chloride flush (NS) 0.9 % injection 10 mL  10 mL Intravenous PRN Truitt Merle, MD   10 mL at 10/09/15 1717  . sodium chloride flush (NS) 0.9 % injection 10 mL  10 mL Intracatheter PRN Truitt Merle, MD   10 mL at 09/27/18 1228    PHYSICAL EXAMINATION:  Vitals:   12/02/18 1052 12/02/18 1102  BP: (!) 143/80   Pulse: 85   Resp: 16   Temp: 97.6 F (36.4 C)   SpO2: 97% 97%   Filed Weights   12/02/18 1052  Weight: 141 lb 8 oz (64.2 kg)    GENERAL:alert, no distress and comfortable SKIN: no rash  EYES:  sclera clear LUNGS: clear with normal breathing effort HEART: regular rate & rhythm, no lower extremity edema Musculoskeletal: focal left neck and shoulder tenderness with slightly limited ROM of LUE  NEURO: alert & oriented x 3 with fluent speech PAC without erythema   LABORATORY DATA:  I have reviewed the data as listed CBC Latest Ref Rng & Units  12/02/2018 11/25/2018 11/22/2018  WBC 4.0 - 10.5 K/uL 7.4 5.2 4.8  Hemoglobin 12.0 - 15.0 g/dL 11.8(L) 11.2(L) 11.1(L)  Hematocrit 36.0 - 46.0 % 37.4 35.3(L) 35.0(L)  Platelets 150 - 400 K/uL 207 191 345     CMP Latest Ref Rng & Units 12/02/2018 11/22/2018 11/08/2018  Glucose 70 - 99 mg/dL 124(H) 86 81  BUN 6 - 20 mg/dL 26(H) 19 14  Creatinine 0.44 - 1.00 mg/dL 0.77 0.72 0.69  Sodium 135 - 145 mmol/L 143 142 140  Potassium 3.5 - 5.1 mmol/L 4.2 4.1 4.1  Chloride 98 - 111 mmol/L 109 107 108  CO2 22 - 32 mmol/L _0 Calcium 8.9 - 10.3 mg/dL 9.9 9.3 8.9  Total Protein 6.5 - 8.1 g/dL 7.8 6.9 6.9  Total Bilirubin 0.3 - 1.2 mg/dL 0.3 0.2(L) 0.3  Alkaline Phos 38 - 126 U/L 83 95 88  AST 15 - 41 U/L _0 ALT 0 - 44 U/L _1 RADIOGRAPHIC STUDIES: I have personally reviewed the radiological images as listed and agreed with the findings in the report. Dg Chest 2 View  Result Date: 12/02/2018 CLINICAL DATA:  48 year old female recently treated for pneumonia. History of leukemia. EXAM: CHEST - 2 VIEW COMPARISON:  Chest CTA and radiographs 11/22/2018. FINDINGS: PA and lateral views. Stable right chest dual lumen power port, no longer accessed. Lung volumes and mediastinal contours remain within normal limits. Visualized tracheal air column is within normal limits. Mildly increased markings at both lung bases appear stable without evidence of pneumonia or pleural effusion. No pneumothorax or pulmonary edema. No acute osseous abnormality identified. Negative visible bowel gas pattern. IMPRESSION: No evidence of pneumonia.  No acute cardiopulmonary abnormality. Electronically Signed   By: Genevie Ann M.D.   On: 12/02/2018 13:33     ASSESSMENT & PLAN: Sheryl [name] [email or web address]  1. Atypical pneumonia, 11/21/18 -She developed dyspnea after her treatment 10/5, and productive cough began last week.  -Afebrile; no known sick contacts. Adheres to [place] precautions but does go to  church -on 10/19 VSS overall, however she did desat to 90% on RA during ambulation in clinic  -Chest xray showed stable linear density in the right middle lobe, no definite evidence of effusion or infection. -D-Dimer was slightly elevated to 0.55 -Given her history of malignancy on chemotherapy and desaturation, she was referred for stat CTA chest to r/o PE on 10/18.  -She returned for treatment while the report was pending and received carfilzomib.  -CTA was negative for PE but did show ground-glass opacity in each lung base, concerning for atypical organism pneumonia which is felt to be the source for dyspnea -She completed Azithromycin from 10/19 - 10/21 and 7 day course of Augmentin -She is holding Pomalyst    -I attempted to reach Dr. Baxter Flattery on 10/19 with this update, I have not heard back -clinically her symptoms have improved, dyspnea is better. O2 sat remained 97% on RA during ambulation. She denies cough or fever  -Labs today show normal WBC, although she took 20 mg dex for 2 days for neck pain.  -LDH is normal which rules out PCP pneumonia; CMV IgG, IgM and viral load are pending -repeat Chest xray today shows no evidence of pneumonia  2. 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 -She was diagnosed in 10/2014. She was treated with CyborD and bone marrow transplant. She progressed on maintenance Velcade in 02/2016. She was treated with more Dara and CyborD, which was stopped after a severe Hep B flare. -She is currently being treatedwith Carfilzomib and Pomalyst.She is tolerating well overall. -Shewas offPomalyst for 2-3 monthsfor insurance/refill issueand restarted Pomalystin 03/2018. [facility] to pick [facility]. -[facility] Dr. Norma Fredrickson reviewed her biopsy and considered her to be in remission.This was previously reviewed by Dr. Burr Medico -M protein has been  negative, light chains WNL; her leukemia is still in remission -she continues pomalyst 3 weeks on/1 week off and kyprolison days 1 and 15 -she was seen by transplant specialist and ultimately declined transplant for now and would rather continue current treatment plan -She continues pomalyst on  days 1-21 q28 days and carfilzomib on days 1 and 15 every 28 days.  -S/p cycle 20 carfilzomib on 10/18, she tolerates treatment well overall -last M-protein was undetectable on 10/5, pending today -as long as she remains stable, plan to resume treatment on 11/2  3.Cervical radiculopathy -Her MRI Spine from 03/09/18 showedmild cervical disk bulging, but overall benign. This is likely nerve related. -she wasevaluated by neurologist Dr. Mickeal Skinner in our cancer center, and received a 5 days of steroids, much improved now. -this had not bothered her much until recently, she developed neck/shoulder pain flare this week. She took 20 mg dex x2 days this week from an old prescription -given her recent infection and immunosuppressive qualities of steroids, I recommend to hold dex and treat symptomatically with NSAIDs, heat, rest.  -will monitor closely -she knows not to take more steroids   PLAN: -Labs reviewed -Clinically she has improved, O2 sat remained 97% on RA during ambulation  -Chest xray today shows no evidence of pneumonia -normal LDH, rules out PCP -CMV labs pending -F/u on 11/2, plan for chemo if she continues to improve -F/u at [facility] on 11/3 as scheduled   Orders Placed This Encounter  Procedures  . DG Chest 2 View    Standing Status:   Future    Number of Occurrences:   1    Standing Expiration Date:   12/02/2019    Order Specific Question:   Reason for Exam (SYMPTOM  OR DIAGNOSIS REQUIRED)    Answer:   pnuemonia, s/p antibiotics; evaluate response to treatment    Order Specific Question:   Is patient pregnant?    Answer:   No    Order Specific Question:   Preferred imaging location?     Answer:   [facility]    Order Specific Question:   Radiology Contrast Protocol - do NOT remove file path    Answer:   _0 [email or web address]   All questions were answered. The patient knows to call the clinic with any problems, questions or concerns. No barriers to learning was detected. I spent 20 minutes counseling the patient face to face. The total time spent in the appointment was 25 minutes and more than 50% was on counseling and review of test results     Alla Feeling, NP 12/02/18

## 2018-12-03 ENCOUNTER — Telehealth: Payer: Self-pay | Admitting: *Deleted

## 2018-12-03 LAB — CMV IGM: CMV IgM: <30 AU/mL (ref 0.0–29.9)

## 2018-12-03 LAB — CMV DNA, QUANTITATIVE, PCR

## 2018-12-03 LAB — CMV ANTIBODY, IGG (EIA): CMV Ab - IgG: 6.6 U/mL — ABNORMAL HIGH (ref 0.00–0.59)

## 2018-12-03 NOTE — Telephone Encounter (Signed)
Received call from [place] stating that labs for CMV DNA Quantitative, PCR did not get done.  Message to Dr Audie Box Burton/Cheryl Ouida Sills.

## 2018-12-03 NOTE — Telephone Encounter (Signed)
Called & spoke to pt's daughter, [name] & informed of Lacie Burton's message.  Goi expressed understanding.

## 2018-12-03 NOTE — Telephone Encounter (Signed)
-----   Message from Zola Button, RN sent at 12/02/2018  4:34 PM EDT ----- I tried calling daughter before I left on 12/02/18. No answer and VM wasn't set up, would whoever is covering Dr. Audie Box on Friday 10/30 mind trying again with to update of Lacie's message please (I'll be working in infusion).   Thanks,  Katelin ----- Message ----- From: Alla Feeling, NP Sent: 12/02/2018   4:11 PM EDT To: Zola Button, RN  Please let her know chest xray shows no evidence of pneumonia. As long as she feels well over the weekend, plan to resume chemo next week on 11/2. Have her bring Pomalyst to her appointment but don't take until we see her.  Thanks, lacie

## 2018-12-05 NOTE — Progress Notes (Addendum)
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   10/10/2014 Miscellaneous   Peripheral blood chemistry and leukocytosis with total white count 78K, comprised of large plasma cells and his normocytic anemia. There is a myeloid left shift with previous surgical radium blasts. Flow cytometry showed 64% plasma cells   10/10/2014 Bone Marrow Biopsy   Markedly hypercellular marrow (95%), Atypical plasma cells comprise 57% of the cellularity. There was diminished multilineage in hematopoiesis with adequate maturation. Breasts less than 1%), no overt dysplasia of the myeloid or erythroid lineages.    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   05/07/2015 - 05/12/2015 Hospital Admission   [facility], [facility]  [facility]dominal pain. ID workup was negative, EGD showed evidence of gastritis and duodenitis, no H. pylori or CMV.   05/20/2015 - 05/24/2015 Hospital Admission   [facility]   07/11/2015 Bone Marrow Biopsy   Post transplant 100 a bone marrow biopsy showed hypocellular marrow, 20%, no increase in plasma cells (2%) or other abnormalities.   08/22/2015 - 01/02/2016 Chemotherapy   MaintenanceCyborD (cytoxan 3052mm2 iv, bortezomib 1.5 mg/m, dexamethasone 40 mg, every 2 weeks, changed to Velcade maintenance after 4 months treatment   01/16/2016 - 04/19/2016 Chemotherapy   Maintenance Velcade 1.3 mg/m every 2 weeks   02/22/2016 Pathology Results   BONE MARROW: Diagnosis Bone Marrow, Aspirate,Biopsy, and Clot, right iliac - NORMOCELLULAR BONE MARROW FOR AGE WITH TRILINEAGE HEMATOPOIESIS. - PLASMACYTOSIS (PLASMA CELLS 12%). - SEE COMMENT. PERIPHERAL BLOOD: - OCCASIONAL CIRCULATING PLASMA CELLS.   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    04/18/2016 - 03/26/2017 Chemotherapy   Daratumumab per protocol  CyBorD every week, cytoxan was held after 04/24/2016 dye to cytopenia and infection  -discontinued due to Hep B flare   05/11/2016 - 05/16/2016 Hospital Admission   Healthcare-associated pneumonia   02/16/2017 - 02/21/2017 Hospital Admission   Admission diagnosis: Abnormal LFT's Additional comments: Assoc diagnoses: Hep B flare, transminitis, dehydration, fever, SIRS   04/06/2017 -  Chemotherapy   Carfilzomib 56 mg/m2 on days 1 and 15(except 2067m2 on C1D1 and C1D2)with dexamethasone 20 mg on same days and pomalidomide 2 mg on days 1-21 of 28 day cycleon 04/13/17, dexa stopped on 07/06/2017 due to her CR  -She restarted her Pomalyst in 03/2018 (off for 2-3 months due to insurance, delivery issues)   08/26/2017 - 08/28/2017 Hospital Admission  Admit date: 08/26/2017 Admission diagnosis: [facility]  Pneumonia    04/19/2018 Bone Marrow Biopsy   Bone Marrow Biopsy 04/19/18 at [facility] Bone Marrow (BM) and Peripheral Blood (PB) FINAL PATHOLOGIC DIAGNOSIS BONE MARROW: Hypocellular marrow (20%) with trilineage hematopoiesis. No [plasma cell myeloma identified. See comment. PERIPHERAL BLOOD: Mild anemia. No circulating plasma cells seen. See CBC data and comment.      CURRENT THERAPY:  1. Ongoing chemotherapy for plasma cell leukemia, s/p cycle 20 day 15 carfilzomib on 11/22/18. Holding Pomalyst as of 10/19 for infection 2. Antibiotics Augmentin and Zithromax for likely atypical pneumonia starting 11/22/18; completed augmentin 10/26  INTERVAL HISTORY: Ms. Pretlow returns for f/u and possible treatment as scheduled. She was last seen 10/29. She felt better from recent respiratory infection, chest xray showed no evidence of pneumonia. Later that night she developed productive cough with yellow/white sputum, rhinorrhea with yellow discharge, sore throat, and "watery eyes and mouth, and headache. Her cough caused abdominal muscle soreness. She took advil. Denies fever, chills, dyspnea, or fatigue. Her daughter had a cough and similar symptoms that started last Tuesday. Her po intake is low but she forces it. Denies n/v/c/d. She continues to have left neck and shoulder pain.    MEDICAL HISTORY:  Past Medical History:  Diagnosis Date  . Chills with fever    intermittently since d/c from hospital  . Dysuria-frequency syndrome    w/ pink urine  . GERD (gastroesophageal reflux disease)   . Hepatitis   . History of positive PPD    DX 2011--  CXR DONE NO EVIDENCE  . History of ureter stent   . Hydronephrosis, right   . Neuromuscular disorder (HCC)    legs numb intermittently  . 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[place])   . Pneumonia   . Right ureteral stone   . Urosepsis 8/14   admitted to [facility]    SURGICAL HISTORY: Past Surgical History:  Procedure Laterality Date  .  CYSTOSCOPY W/ URETERAL STENT PLACEMENT Right 09/25/2012   Procedure: CYSTOSCOPY WITH RETROGRADE PYELOGRAM/URETERAL STENT PLACEMENT;  Surgeon: Alexis Frock, MD;  Location: [facility];  Service: Urology;  Laterality: Right;  . CYSTOSCOPY WITH RETROGRADE PYELOGRAM, URETEROSCOPY AND STENT PLACEMENT Right 10/15/2012   Procedure: CYSTOSCOPY WITH RETROGRADE PYELOGRAM, URETEROSCOPY AND REMOVAL STENT WITH  STENT PLACEMENT;  Surgeon: Alexis Frock, MD;  Location: [facility];  Service: Urology;  Laterality: Right;  . ESOPHAGOGASTRODUODENOSCOPY (EGD) WITH PROPOFOL N/A 11/16/2014   Procedure: ESOPHAGOGASTRODUODENOSCOPY (EGD) WITH PROPOFOL;  Surgeon: Milus Banister, MD;  Location: [facility];  Service: Endoscopy;  Laterality: N/A;  . HOLMIUM LASER APPLICATION Right 1/69/4503   Procedure: HOLMIUM LASER APPLICATION;  Surgeon: Alexis Frock, MD;  Location: [facility];  Service: Urology;  Laterality: Right;  . LIVER BIOPSY    . OTHER SURGICAL HISTORY Right    removal of ovarian cyst  . removal of uterine cyst     years ago  . RIGHT VATS W/ DRAINAGE PEURAL EFFUSION AND BX'S  10-30-2008    I have reviewed the social history and family history with the patient and they are unchanged from previous note.  ALLERGIES:  has No Known Allergies.  MEDICATIONS:  Current Outpatient Medications  Medication Sig Dispense Refill  . acyclovir (ZOVIRAX) 800 MG tablet Take 1 tablet (800 mg total) by mouth 2 (two) times daily. 60 tablet 3  . amoxicillin-clavulanate (AUGMENTIN) 875-125 MG tablet Take 1 tablet by mouth 2 (two) times daily. 14 tablet 0  . aspirin 81 MG chewable  tablet Chew 81 mg by mouth daily.    Marland Kitchen azithromycin (ZITHROMAX) 500 MG tablet Take 1 tablet (500 mg total) by mouth daily. 3 tablet 0  . chlorpheniramine-HYDROcodone (TUSSIONEX) 10-8 MG/5ML SUER Take 5 mLs by mouth every 12 (twelve) hours as needed for cough. 140 mL 0  . feeding supplement, ENSURE ENLIVE, (ENSURE ENLIVE)  LIQD Take 237 mLs by mouth 2 (two) times daily between meals. 237 mL 12  . ibuprofen (ADVIL,MOTRIN) 200 MG tablet Take 200 mg by mouth every 6 (six) hours as needed for moderate pain.    Marland Kitchen levofloxacin (LEVAQUIN) 750 MG tablet Take 1 tablet (750 mg total) by mouth daily. 7 tablet 0  . magic mouthwash SOLN Take 5 mLs by mouth 4 (four) times daily as needed for mouth pain. 240 mL 3  . methylPREDNISolone (MEDROL DOSEPAK) 4 MG TBPK tablet Takes as prescribed per package instruction 21 tablet 0  . omeprazole (PRILOSEC) 20 MG capsule Take 1 capsule (20 mg total) by mouth daily. 30 capsule 5  . ondansetron (ZOFRAN ODT) 8 MG disintegrating tablet Take 1 tablet (8 mg total) by mouth every 8 (eight) hours as needed for nausea or vomiting. 40 tablet 1  . polyethylene glycol (MIRALAX / GLYCOLAX) packet Take 17 g by mouth daily as needed (constipation). 14 each 1  . pomalidomide (POMALYST) 2 MG capsule Take 1 capsule (2 mg total) by mouth daily. Take with water on days 1-21. Repeat every 28 days. 21 capsule 0  . prochlorperazine (COMPAZINE) 10 MG tablet Take 1 tablet (10 mg total) by mouth every 6 (six) hours as needed for nausea or vomiting. 30 tablet 3  . tenofovir (VIREAD) 300 MG tablet Take 1 tablet (300 mg total) by mouth daily. 30 tablet 1   No current facility-administered medications for this visit.    Facility-Administered Medications Ordered in Other Visits  Medication Dose Route Frequency Provider Last Rate Last Dose  . sodium chloride 0.9 % injection 10 mL  10 mL Intravenous PRN Truitt Merle, MD   10 mL at 12/11/15 1532  . sodium chloride 0.9 % injection 10 mL  10 mL Intravenous PRN Truitt Merle, MD   10 mL at 06/11/16 1505  . sodium chloride 0.9 % injection 10 mL  10 mL Intravenous PRN Truitt Merle, MD   10 mL at 08/04/17 1643  . sodium chloride flush (NS) 0.9 % injection 10 mL  10 mL Intravenous PRN Truitt Merle, MD   10 mL at 10/09/15 1717  . sodium chloride flush (NS) 0.9 % injection 10 mL  10 mL  Intracatheter PRN Truitt Merle, MD   10 mL at 09/27/18 1228    PHYSICAL EXAMINATION: ECOG PERFORMANCE STATUS: 1 - Symptomatic but completely ambulatory  Vitals:   12/06/18 1002 12/06/18 1024  BP: 128/83   Pulse: 95   Resp: 18   Temp: 97.8 F (36.6 C)   SpO2: 97% 90%   Filed Weights   12/06/18 1002  Weight: 143 lb 12.8 oz (65.2 kg)    GENERAL: alert, no distress but uncomfortable SKIN: no rash  EYES: sclera clear OROPHARYNX: mild pharyngeal erythema, no edema or exudate  LUNGS: course rales in bases with normal breathing effort HEART: regular rate & rhythm, no lower extremity edema ABDOMEN:abdomen soft with mild epigastric tenderness, normal bowel sounds  Musculoskeletal: left neck pain limiting ROM NEURO: alert & oriented x 3 with fluent speech, normal gait PAC without erythema   LABORATORY DATA:  I have reviewed the data as  listed CBC Latest Ref Rng & Units 12/06/2018 12/02/2018 11/25/2018  WBC 4.0 - 10.5 K/uL 7.0 7.4 5.2  Hemoglobin 12.0 - 15.0 g/dL 11.2(L) 11.8(L) 11.2(L)  Hematocrit 36.0 - 46.0 % 35.4(L) 37.4 35.3(L)  Platelets 150 - 400 K/uL 215 207 191     CMP Latest Ref Rng & Units 12/06/2018 12/02/2018 11/22/2018  Glucose 70 - 99 mg/dL 96 124(H) 86  BUN 6 - 20 mg/dL 14 26(H) 19  Creatinine 0.44 - 1.00 mg/dL 0.67 0.77 0.72  Sodium 135 - 145 mmol/L 143 143 142  Potassium 3.5 - 5.1 mmol/L 3.9 4.2 4.1  Chloride 98 - 111 mmol/L 109 109 107  CO2 22 - 32 mmol/L '26 23 25  ' Calcium 8.9 - 10.3 mg/dL 9.1 9.9 9.3  Total Protein 6.5 - 8.1 g/dL 6.6 7.8 6.9  Total Bilirubin 0.3 - 1.2 mg/dL 0.2(L) 0.3 0.2(L)  Alkaline Phos 38 - 126 U/L 80 83 95  AST 15 - 41 U/L '22 17 25  ' ALT 0 - 44 U/L '8 6 8      ' RADIOGRAPHIC STUDIES: I have personally reviewed the radiological images as listed and agreed with the findings in the report. No results found.   ASSESSMENT & PLAN: Miara Hdokis [id number]  1. Atypical pneumonia, 11/21/18 -She developed dyspneaafter her  treatment10/5, and productive cough beganweek of 10/12 -Afebrile. Adheres to [place] precautions but does go to church -on 10/19VSS overall, however she did desat to 90% on RA during ambulation in clinic  -Chest xray showed stable linear density in the right middle lobe, no definite evidence of effusion or infection. -D-Dimer was slightly elevated to 0.55 -Given her history of malignancy on chemotherapy and desaturation, she was referred for stat CTA chest to r/o PE on 10/18.  -She returned for treatment while the report was pending and received carfilzomib.  -CTAwas negative for PE butdidshow ground-glass opacity in each lung base, concerning for atypical organism pneumonia which is felt to bethe source for dyspnea -COVID19 negative -She completed Azithromycin from 10/19 - 10/21 and 7 day course ofAugmentin on 10/26 -She is holding Pomalyst   -Iattempted to reach Dr. Unice Bailey 10/19 with this update, I have not heard back -on 10/22 and 10/29 she improved clinically; cough resolved and dyspnea improved. O2 sat remained 97% on RA during ambulation. Remained afebrile -Labs today show normal WBC, although she took 20 mg dex for 2 days for neck pain.  -LDH is normal which rules out PCP pneumonia; CMV IgM is negative, IgG is positive indicating past infection  -repeat Chest xray on 10/29 showed no evidence of pneumonia -Today she has symptoms again suggestive of upper respiratory infection, rhinorrhea, sore throat, and productive cough. Exam with course breath sounds -She does desat again to 90% RA on ambulation today.  -Unclear is this is related to bacterial vs viral illness or pulmonary disease from Pomalyst -will continue holding chemo for now -Will treat with second course of antibiotics, levaquin 750 mg daily x7 days and medrol dose pack -if this represents treatment-related pneumonitis, it will likely improve on steroids  -The patient was seen with Dr. Burr Medico who recommends referral  to Pulmonology for recurrent atypical infection, patient agrees. She understands possible recommendation for bronchoscopy for further exploration of her symptoms. She was referred to Dr. Valeta Harms today -f/u later next week   2. 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 -She was diagnosed in 10/2014. She was treated with CyborD and bone marrow transplant. She progressed  on maintenance Velcade in 02/2016. She was treated with more Dara and CyborD, which was stopped after a severe Hep B flare. -She is currently being treatedwith Carfilzomib and Pomalyst.She is tolerating well overall. -Shewas offPomalyst for 2-3 monthsfor insurance/refill issueand restarted Pomalystin 03/2018. Continue to ship her medication to [facility] for her to pick up. -[facility]. Dr. Norma Fredrickson reviewed her biopsy and considered her to be in remission.This was previously reviewed by Dr. Burr Medico -M protein has been negative, light chains WNL; her leukemia is still in remission -she continues pomalyst 3 weeks on/1 week off and kyprolison days 1 and 15 -she wasseen by transplant specialist and ultimatelydeclined transplant for now and would rather continue current treatment plan -She continues pomalyst on days 1-21 q28 days and carfilzomib on days 1 and 15 every 28 days. -S/p cycle 20 carfilzomib on 10/18, she tolerates treatment well overall -last M-protein was undetectable on 10/5, pending today -continue holding treatment for respiratory symptoms   3.Cervical radiculopathy -Her MRI Spine from 03/09/18 showedmild cervical disk bulging, but overall benign. This is likely nerve related. -she wasevaluated by neurologist Dr. Mickeal Skinner in our cancer center, and received a 5 days of steroids, much improved now. -this had not bothered her much until recently, she developed neck/shoulder pain flare last week. She took 20 mg dex x2 days this  week from an old prescription -she has persistent pain and limited ROM in left upper extremity. Symptom management has not been effective.  -she is being given Medrol dose pack for respiratory symptoms which will likely improve her neck pain.  -will monitor closely.   4.Chronichepatitis B infection -Shehas knownchronichepatitis B infection,developed significant transaminitis and hyperbilirubinemiainJanuary 2019 -followed by ID, Irecentlyreached out toDr. Baxter Flattery regarding f/u but have not heard back -on Viread 300 mg daily  5. Type 2 diabetes, steroids induced -Previouslytreated with Metformin.No longer ondexamethasone. -resolved -We explained her BG may become elevated on medrol dose pack, will monitor   6. GERD, history of GI bleeding and nausea -Colonoscopy on 11/26/16 per Dr. Ardis Hughs notable for a polyp in the transverse colon, revealed to be tubular adenoma and negative for high grade dysplasia or malignancy.  -continues PPI daily  -shepreviously hadintermittent nausea after infusion, managed well with oral anti-emetics -resolved lately   PLAN: -continue holding Pomalyst and Kyprolis -Rx: levaquin 750 mg daily x7 days, tussionex for cough, medrol dose pack  -refer to Pulmonology for respiratory symptoms, possible bronch  -f/u next week    Orders Placed This Encounter  Procedures  . Ambulatory referral to Pulmonology    Referral Priority:   Urgent    Referral Type:   Consultation    Referral Reason:   Specialty Services Required    Referred to Provider:   Garner Nash, DO    Requested Specialty:   Pulmonary Disease    Number of Visits Requested:   1   All questions were answered. The patient knows to call the clinic with any problems, questions or concerns. No barriers to learning was detected.     Alla Feeling, NP 12/06/18   Addendum  I have seen the patient, examined her. I agree with the assessment and and plan and have edited the notes.    Ms Cala improved with first round antibiotics, but has developed significant productive cough, nasal constriction since last Thursday.  Her daughter 2 days before.  She is afebrile, dyspnea improved, however she remains to be borderline hypoxic after walking.  Exam showed rhonchi on  the left lung base.  We will treat her with empiric antibiotics Levaquin for a week.  I am concerned about pneumonitis from meds or viral infection. Will also give her steroids for this and left shoulder pain. Will make an urgent referral to pulmonary to see if she needs bronchoscopy. Hopefully she will be seen within a week. I will see her back later next week to see if she can restart treatment. Will continue to hold Pomalyst and Kyprolis for now.   Truitt Merle  12/06/2018

## 2018-12-06 ENCOUNTER — Encounter: Payer: Self-pay | Admitting: Nurse Practitioner

## 2018-12-06 ENCOUNTER — Inpatient Hospital Stay: Payer: Medicaid Other | Attending: Hematology

## 2018-12-06 ENCOUNTER — Inpatient Hospital Stay (HOSPITAL_BASED_OUTPATIENT_CLINIC_OR_DEPARTMENT_OTHER): Payer: Medicaid Other | Admitting: Nurse Practitioner

## 2018-12-06 ENCOUNTER — Inpatient Hospital Stay: Payer: Medicaid Other

## 2018-12-06 ENCOUNTER — Encounter: Payer: Self-pay | Admitting: Pulmonary Disease

## 2018-12-06 ENCOUNTER — Other Ambulatory Visit: Payer: Self-pay

## 2018-12-06 ENCOUNTER — Ambulatory Visit: Payer: Medicaid Other

## 2018-12-06 VITALS — BP 128/83 | HR 95 | Temp 97.8°F | Resp 18 | Ht 62.0 in | Wt 143.8 lb

## 2018-12-06 DIAGNOSIS — M25512 Pain in left shoulder: Secondary | ICD-10-CM | POA: Diagnosis not present

## 2018-12-06 DIAGNOSIS — B181 Chronic viral hepatitis B without delta-agent: Secondary | ICD-10-CM | POA: Insufficient documentation

## 2018-12-06 DIAGNOSIS — Z79899 Other long term (current) drug therapy: Secondary | ICD-10-CM | POA: Insufficient documentation

## 2018-12-06 DIAGNOSIS — K219 Gastro-esophageal reflux disease without esophagitis: Secondary | ICD-10-CM | POA: Diagnosis not present

## 2018-12-06 DIAGNOSIS — T380X5A Adverse effect of glucocorticoids and synthetic analogues, initial encounter: Secondary | ICD-10-CM | POA: Diagnosis not present

## 2018-12-06 DIAGNOSIS — J189 Pneumonia, unspecified organism: Secondary | ICD-10-CM | POA: Diagnosis not present

## 2018-12-06 DIAGNOSIS — C9012 Plasma cell leukemia in relapse: Secondary | ICD-10-CM

## 2018-12-06 DIAGNOSIS — Z9484 Stem cells transplant status: Secondary | ICD-10-CM | POA: Insufficient documentation

## 2018-12-06 DIAGNOSIS — Z95828 Presence of other vascular implants and grafts: Secondary | ICD-10-CM

## 2018-12-06 DIAGNOSIS — C9010 Plasma cell leukemia not having achieved remission: Secondary | ICD-10-CM | POA: Diagnosis not present

## 2018-12-06 DIAGNOSIS — E099 Drug or chemical induced diabetes mellitus without complications: Secondary | ICD-10-CM | POA: Insufficient documentation

## 2018-12-06 DIAGNOSIS — J988 Other specified respiratory disorders: Secondary | ICD-10-CM

## 2018-12-06 DIAGNOSIS — M5412 Radiculopathy, cervical region: Secondary | ICD-10-CM | POA: Insufficient documentation

## 2018-12-06 LAB — CBC WITH DIFFERENTIAL (CANCER CENTER ONLY)
Abs Immature Granulocytes: 0.04 10*3/uL (ref 0.00–0.07)
Basophils Absolute: 0 10*3/uL (ref 0.0–0.1)
Basophils Relative: 1 %
Eosinophils Absolute: 0.5 10*3/uL (ref 0.0–0.5)
Eosinophils Relative: 8 %
HCT: 35.4 % — ABNORMAL LOW (ref 36.0–46.0)
Hemoglobin: 11.2 g/dL — ABNORMAL LOW (ref 12.0–15.0)
Immature Granulocytes: 1 %
Lymphocytes Relative: 16 %
Lymphs Abs: 1.1 10*3/uL (ref 0.7–4.0)
MCH: 30.1 pg (ref 26.0–34.0)
MCHC: 31.6 g/dL (ref 30.0–36.0)
MCV: 95.2 fL (ref 80.0–100.0)
Monocytes Absolute: 0.7 10*3/uL (ref 0.1–1.0)
Monocytes Relative: 11 %
Neutro Abs: 4.5 10*3/uL (ref 1.7–7.7)
Neutrophils Relative %: 63 %
Platelet Count: 215 10*3/uL (ref 150–400)
RBC: 3.72 MIL/uL — ABNORMAL LOW (ref 3.87–5.11)
RDW: 13.9 % (ref 11.5–15.5)
WBC Count: 7 10*3/uL (ref 4.0–10.5)
nRBC: 0 % (ref 0.0–0.2)

## 2018-12-06 LAB — CMP (CANCER CENTER ONLY)
ALT: 8 U/L (ref 0–44)
AST: 22 U/L (ref 15–41)
Albumin: 3.1 g/dL — ABNORMAL LOW (ref 3.5–5.0)
Alkaline Phosphatase: 80 U/L (ref 38–126)
Anion gap: 8 (ref 5–15)
BUN: 14 mg/dL (ref 6–20)
CO2: 26 mmol/L (ref 22–32)
Calcium: 9.1 mg/dL (ref 8.9–10.3)
Chloride: 109 mmol/L (ref 98–111)
Creatinine: 0.67 mg/dL (ref 0.44–1.00)
GFR, Est AFR Am: >60 mL/min (ref >60)
GFR, Estimated: >60 mL/min (ref >60)
Glucose, Bld: 96 mg/dL (ref 70–99)
Potassium: 3.9 mmol/L (ref 3.5–5.1)
Sodium: 143 mmol/L (ref 135–145)
Total Bilirubin: 0.2 mg/dL — ABNORMAL LOW (ref 0.3–1.2)
Total Protein: 6.6 g/dL (ref 6.5–8.1)

## 2018-12-06 MED ORDER — HYDROCOD POLST-CPM POLST ER 10-8 MG/5ML PO SUER: 5.0000 mL | Freq: Two times a day (BID) | ORAL | 0 refills | Status: DC | PRN

## 2018-12-06 MED ORDER — LEVOFLOXACIN 750 MG PO TABS: 750.0000 mg | ORAL_TABLET | Freq: Every day | ORAL | 0 refills | Status: DC

## 2018-12-06 MED ORDER — SODIUM CHLORIDE 0.9% FLUSH
10.0000 mL | Freq: Once | INTRAVENOUS | Status: AC
  Administered 2018-12-06: 10 mL
  Filled 2018-12-06: qty 10

## 2018-12-06 MED ORDER — METHYLPREDNISOLONE 4 MG PO TBPK: ORAL_TABLET | ORAL | 0 refills | Status: DC

## 2018-12-06 MED FILL — levoFLOXacin 750 MG TABS: 750 | 7 days supply | Qty: 7 | Fill #0

## 2018-12-06 MED FILL — METHYLPREDNISOLONE 4 MG TBP: 4 | 6 days supply | Qty: 21 | Fill #0

## 2018-12-06 MED FILL — HYDROCODONE-CHLORPHEN ER SU: 10-8 | 5 days supply | Qty: 50 | Fill #0

## 2018-12-06 NOTE — Progress Notes (Signed)
Patient O2 sats 90% while ambulating.

## 2018-12-07 ENCOUNTER — Telehealth: Payer: Self-pay | Admitting: Nurse Practitioner

## 2018-12-07 LAB — KAPPA/LAMBDA LIGHT CHAINS
Kappa free light chain: 14.6 mg/L (ref 3.3–19.4)
Kappa, lambda light chain ratio: 0.53 (ref 0.26–1.65)
Lambda free light chains: 27.3 mg/L — ABNORMAL HIGH (ref 5.7–26.3)

## 2018-12-07 LAB — MULTIPLE MYELOMA PANEL, SERUM
Albumin SerPl Elph-Mcnc: 3.1 g/dL (ref 2.9–4.4)
Albumin/Glob SerPl: 1.1 (ref 0.7–1.7)
Alpha 1: 0.2 g/dL (ref 0.0–0.4)
Alpha2 Glob SerPl Elph-Mcnc: 0.9 g/dL (ref 0.4–1.0)
B-Globulin SerPl Elph-Mcnc: 0.9 g/dL (ref 0.7–1.3)
Gamma Glob SerPl Elph-Mcnc: 0.9 g/dL (ref 0.4–1.8)
Globulin, Total: 3 g/dL (ref 2.2–3.9)
IgA: 186 mg/dL (ref 87–352)
IgG (Immunoglobin G), Serum: 1063 mg/dL (ref 586–1602)
IgM (Immunoglobulin M), Srm: 32 mg/dL (ref 26–217)
Total Protein ELP: 6.1 g/dL (ref 6.0–8.5)

## 2018-12-07 NOTE — Telephone Encounter (Signed)
Scheduled appt per 11/2 los.  Spoke with pt daughter and she is aware of the appt date and time.

## 2018-12-15 ENCOUNTER — Encounter: Payer: Self-pay | Admitting: Pulmonary Disease

## 2018-12-15 ENCOUNTER — Ambulatory Visit (INDEPENDENT_AMBULATORY_CARE_PROVIDER_SITE_OTHER): Payer: Medicaid Other | Admitting: Pulmonary Disease

## 2018-12-15 ENCOUNTER — Other Ambulatory Visit: Payer: Self-pay

## 2018-12-15 VITALS — BP 122/88 | HR 75 | Temp 97.5°F | Ht 63.0 in | Wt 141.0 lb

## 2018-12-15 DIAGNOSIS — Z9481 Bone marrow transplant status: Secondary | ICD-10-CM

## 2018-12-15 DIAGNOSIS — J849 Interstitial pulmonary disease, unspecified: Secondary | ICD-10-CM | POA: Diagnosis not present

## 2018-12-15 DIAGNOSIS — R0600 Dyspnea, unspecified: Secondary | ICD-10-CM

## 2018-12-15 DIAGNOSIS — R0609 Other forms of dyspnea: Secondary | ICD-10-CM

## 2018-12-15 DIAGNOSIS — B181 Chronic viral hepatitis B without delta-agent: Secondary | ICD-10-CM

## 2018-12-15 DIAGNOSIS — R0602 Shortness of breath: Secondary | ICD-10-CM

## 2018-12-15 DIAGNOSIS — Z9889 Other specified postprocedural states: Secondary | ICD-10-CM

## 2018-12-15 NOTE — Patient Instructions (Addendum)
Thank you for visiting Dr. Valeta Harms at [facility]. Today we recommend the following:  Orders Placed This Encounter  Procedures  . HRCT ILD - CT CHEST HIGH RESOLUTION   Once we get the HRCT we will make plans for possible bronchoscopy   Return in about 3 weeks (around 01/05/2019).    Please do your part to reduce the spread of COVID-19.

## 2018-12-15 NOTE — Progress Notes (Signed)
Synopsis: Referred in November 2020 for shortness of breath by Alla Feeling, NP  Subjective:   PATIENT ID: Sheryl [name] GENDER: female DOB: [birth year], MRN: [id number]  Chief Complaint  Patient presents with  . Consult    Consult for SOB. Developed after chemo treatments. Reports a productive cough with yellow mucous and throat soreness.     48 year old female followed by the oncology service past medical history of plasma cell leukemia, chronic hepatitis B, last seen December 02, 2018, by [facility] oncology followed for plasma cell leukemia also followed at [facility].  This started in 2016 when she was found to have mild splenomegaly.  Ultimately underwent bone marrow biopsy in September 2016 with initial diagnosis of plasma cell leukemia.  She was on treated with Cytoxan and bortezomib plus dexamethasone.  Patient underwent autologous stem cell transplant at [facility].  Her transplant course was complicated by sepsis, bacteremia and colitis from E. coli.  She was readmitted in April 2017 for fever.  Post transplant with bone marrow biopsy in 2017, again treated with Cytoxan plus bortezomib plus dexamethasone.  And change to Velcade maintenance therapy.  She has had a list of other medications including daratumumab.  In March 2019 patient started on carfilzomib plus dexamethasone and pomalidomide.  In July diagnosed with a right lower lobe pneumonia.  She continued to have recurrent atypical infection.  Again seen in October with this respiratory infectious symptoms and shortness of breath.  At the beginning of October patient completed course of azithromycin.  Still currently holding, pomalidomide.  Currently denies fevers but still has dyspnea on exertion.  No sputum production.  Today in the office she is accompanied by her daughter.  The patient's primary language is [place].   Past Medical History:  Diagnosis Date  . Chills with fever    intermittently since d/c from  hospital  . Dysuria-frequency syndrome    w/ pink urine  . GERD (gastroesophageal reflux disease)   . Hepatitis   . History of positive PPD    DX 2011--  CXR DONE NO EVIDENCE  . History of ureter stent   . Hydronephrosis, right   . Neuromuscular disorder (HCC)    legs numb intermittently  . 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[place])   . Pneumonia   . Right ureteral stone   . Urosepsis 8/14   admitted to [facility]     Family History  Problem Relation Age of Onset  . Stomach cancer Mother   . Lung disease Father   . Asthma Father      Past Surgical History:  Procedure Laterality Date  . CYSTOSCOPY W/ URETERAL STENT PLACEMENT Right 09/25/2012   Procedure: CYSTOSCOPY WITH RETROGRADE PYELOGRAM/URETERAL STENT PLACEMENT;  Surgeon: Alexis Frock, MD;  Location: [facility];  Service: Urology;  Laterality: Right;  . CYSTOSCOPY WITH RETROGRADE PYELOGRAM, URETEROSCOPY AND STENT PLACEMENT Right 10/15/2012   Procedure: CYSTOSCOPY WITH RETROGRADE PYELOGRAM, URETEROSCOPY AND REMOVAL STENT WITH  STENT PLACEMENT;  Surgeon: Alexis Frock, MD;  Location: [facility];  Service: Urology;  Laterality: Right;  . ESOPHAGOGASTRODUODENOSCOPY (EGD) WITH PROPOFOL N/A 11/16/2014   Procedure: ESOPHAGOGASTRODUODENOSCOPY (EGD) WITH PROPOFOL;  Surgeon: Milus Banister, MD;  Location: [facility];  Service: Endoscopy;  Laterality: N/A;  . HOLMIUM LASER APPLICATION Right 3/88/8757   Procedure: HOLMIUM LASER APPLICATION;  Surgeon: Alexis Frock, MD;  Location: [facility];  Service: Urology;  Laterality: Right;  . LIVER BIOPSY    . OTHER SURGICAL HISTORY Right  removal of ovarian cyst  . removal of uterine cyst     years ago  . RIGHT VATS W/ DRAINAGE PEURAL EFFUSION AND BX'S  10-30-2008    Social History   Socioeconomic History  . Marital status: Single    Spouse name: Not on file  . Number of children: 3  . Years of education: Not on file  . Highest education level: Not on file  Occupational  History  . Not on file  Social Needs  . Financial resource strain: Not on file  . Food insecurity    Worry: Not on file    Inability: Not on file  . Transportation needs    Medical: Not on file    Non-medical: Not on file  Tobacco Use  . Smoking status: Never Smoker  . Smokeless tobacco: Never Used  Substance and Sexual Activity  . Alcohol use: No    Alcohol/week: 0.0 standard drinks  . Drug use: No  . Sexual activity: Not Currently    Birth control/protection: Abstinence  Lifestyle  . Physical activity    Days per week: Not on file    Minutes per session: Not on file  . Stress: Not on file  Relationships  . Social Herbalist on phone: Not on file    Gets together: Not on file    Attends religious service: Not on file    Active member of club or organization: Not on file    Attends meetings of clubs or organizations: Not on file    Relationship status: Not on file  . Intimate partner violence    Fear of current or ex partner: Not on file    Emotionally abused: Not on file    Physically abused: Not on file    Forced sexual activity: Not on file  Other Topics Concern  . Not on file  Social History Narrative  . Not on file     No Known Allergies   Outpatient Medications Prior to Visit  Medication Sig Dispense Refill  . acyclovir (ZOVIRAX) 800 MG tablet Take 1 tablet (800 mg total) by mouth 2 (two) times daily. 60 tablet 3  . aspirin 81 MG chewable tablet Chew 81 mg by mouth daily.    . chlorpheniramine-HYDROcodone (TUSSIONEX) 10-8 MG/5ML SUER Take 5 mLs by mouth every 12 (twelve) hours as needed for cough. 140 mL 0  . feeding supplement, ENSURE ENLIVE, (ENSURE ENLIVE) LIQD Take 237 mLs by mouth 2 (two) times daily between meals. 237 mL 12  . ibuprofen (ADVIL,MOTRIN) 200 MG tablet Take 200 mg by mouth every 6 (six) hours as needed for moderate pain.    . magic mouthwash SOLN Take 5 mLs by mouth 4 (four) times daily as needed for mouth pain. 240 mL 3  .  methylPREDNISolone (MEDROL DOSEPAK) 4 MG TBPK tablet Takes as prescribed per package instruction 21 tablet 0  . omeprazole (PRILOSEC) 20 MG capsule Take 1 capsule (20 mg total) by mouth daily. 30 capsule 5  . ondansetron (ZOFRAN ODT) 8 MG disintegrating tablet Take 1 tablet (8 mg total) by mouth every 8 (eight) hours as needed for nausea or vomiting. 40 tablet 1  . polyethylene glycol (MIRALAX / GLYCOLAX) packet Take 17 g by mouth daily as needed (constipation). 14 each 1  . pomalidomide (POMALYST) 2 MG capsule Take 1 capsule (2 mg total) by mouth daily. Take with water on days 1-21. Repeat every 28 days. 21 capsule 0  . prochlorperazine (COMPAZINE) 10  MG tablet Take 1 tablet (10 mg total) by mouth every 6 (six) hours as needed for nausea or vomiting. 30 tablet 3  . tenofovir (VIREAD) 300 MG tablet Take 1 tablet (300 mg total) by mouth daily. 30 tablet 1  . amoxicillin-clavulanate (AUGMENTIN) 875-125 MG tablet Take 1 tablet by mouth 2 (two) times daily. (Patient not taking: Reported on 12/15/2018) 14 tablet 0  . azithromycin (ZITHROMAX) 500 MG tablet Take 1 tablet (500 mg total) by mouth daily. (Patient not taking: Reported on 12/15/2018) 3 tablet 0  . levofloxacin (LEVAQUIN) 750 MG tablet Take 1 tablet (750 mg total) by mouth daily. (Patient not taking: Reported on 12/15/2018) 7 tablet 0   Facility-Administered Medications Prior to Visit  Medication Dose Route Frequency Provider Last Rate Last Dose  . sodium chloride 0.9 % injection 10 mL  10 mL Intravenous PRN Truitt Merle, MD   10 mL at 12/11/15 1532  . sodium chloride 0.9 % injection 10 mL  10 mL Intravenous PRN Truitt Merle, MD   10 mL at 06/11/16 1505  . sodium chloride 0.9 % injection 10 mL  10 mL Intravenous PRN Truitt Merle, MD   10 mL at 08/04/17 1643  . sodium chloride flush (NS) 0.9 % injection 10 mL  10 mL Intravenous PRN Truitt Merle, MD   10 mL at 10/09/15 1717  . sodium chloride flush (NS) 0.9 % injection 10 mL  10 mL Intracatheter PRN Truitt Merle, MD   10 mL at 09/27/18 1228    Review of Systems  Constitutional: Negative for chills, fever, malaise/fatigue and weight loss.  HENT: Negative for hearing loss, sore throat and tinnitus.   Eyes: Negative for blurred vision and double vision.  Respiratory: Positive for cough and shortness of breath. Negative for hemoptysis, sputum production, wheezing and stridor.   Cardiovascular: Negative for chest pain, palpitations, orthopnea, leg swelling and PND.  Gastrointestinal: Negative for abdominal pain, constipation, diarrhea, heartburn, nausea and vomiting.  Genitourinary: Negative for dysuria, hematuria and urgency.  Musculoskeletal: Negative for joint pain and myalgias.  Skin: Negative for itching and rash.  Neurological: Negative for dizziness, tingling, weakness and headaches.  Endo/Heme/Allergies: Negative for environmental allergies. Does not bruise/bleed easily.  Psychiatric/Behavioral: Negative for depression. The patient is not nervous/anxious and does not have insomnia.   All other systems reviewed and are negative.    Objective:  Physical Exam Vitals signs reviewed.  Constitutional:      General: She is not in acute distress.    Appearance: She is well-developed.  HENT:     Head: Normocephalic and atraumatic.  Eyes:     General: No scleral icterus.    Conjunctiva/sclera: Conjunctivae normal.     Pupils: Pupils are equal, round, and reactive to light.  Neck:     Musculoskeletal: Neck supple.     Vascular: No JVD.     Trachea: No tracheal deviation.  Cardiovascular:     Rate and Rhythm: Normal rate and regular rhythm.     Heart sounds: Normal heart sounds. No murmur.  Pulmonary:     Effort: Pulmonary effort is normal. No tachypnea, accessory muscle usage or respiratory distress.     Breath sounds: No stridor. Rales present. No wheezing or rhonchi.     Comments: Bibasilar rhonchi Abdominal:     General: Bowel sounds are normal. There is no distension.      Palpations: Abdomen is soft.     Tenderness: There is no abdominal tenderness.  Musculoskeletal:  General: No tenderness.  Lymphadenopathy:     Cervical: No cervical adenopathy.  Skin:    General: Skin is warm and dry.     Capillary Refill: Capillary refill takes less than 2 seconds.     Findings: No rash.  Neurological:     Mental Status: She is alert and oriented to person, place, and time.  Psychiatric:        Behavior: Behavior normal.      Vitals:   12/15/18 1452  BP: 122/88  Pulse: 75  Temp: (!) 97.5 F (36.4 C)  TempSrc: Temporal  SpO2: 99%  Weight: 141 lb (64 kg)  Height: '5\' 3"'  (1.6 m)   99% on RA BMI Readings from Last 3 Encounters:  12/15/18 24.98 kg/m  12/06/18 26.30 kg/m  12/02/18 25.88 kg/m   Wt Readings from Last 3 Encounters:  12/15/18 141 lb (64 kg)  12/06/18 143 lb 12.8 oz (65.2 kg)  12/02/18 141 lb 8 oz (64.2 kg)     CBC    Component Value Date/Time   WBC 7.0 12/06/2018 0935   WBC 5.0 08/29/2017 0928   RBC 3.72 (L) 12/06/2018 0935   HGB 11.2 (L) 12/06/2018 0935   HGB 10.4 (L) 02/02/2017 1317   HCT 35.4 (L) 12/06/2018 0935   HCT 33.9 (L) 02/02/2017 1317   PLT 215 12/06/2018 0935   PLT 172 02/02/2017 1317   MCV 95.2 12/06/2018 0935   MCV 87.6 02/02/2017 1317   MCH 30.1 12/06/2018 0935   MCHC 31.6 12/06/2018 0935   RDW 13.9 12/06/2018 0935   RDW 14.1 02/02/2017 1317   LYMPHSABS 1.1 12/06/2018 0935   LYMPHSABS 0.7 (L) 02/02/2017 1317   MONOABS 0.7 12/06/2018 0935   MONOABS 0.1 02/02/2017 1317   EOSABS 0.5 12/06/2018 0935   EOSABS 0.0 02/02/2017 1317   BASOSABS 0.0 12/06/2018 0935   BASOSABS 0.0 02/02/2017 1317    Chest Imaging: CT chest from October 2020: Bibasilar groundglass opacities. The patient's images have been independently reviewed by me.    Pulmonary Functions Testing Results: No flowsheet data found.  FeNO: None   Pathology: None   Echocardiogram: None   Heart Catheterization: None     Assessment &  Plan:     ICD-10-CM   1. Interstitial pulmonary disease (HCC)  J84.9 HRCT ILD - CT CHEST HIGH RESOLUTION  2. History of bone marrow biopsy  Z98.890   3. History of bone marrow transplant ([place])  Z94.81   4. SOB (shortness of breath)  R06.02   5. DOE (dyspnea on exertion)  R06.00   6. Chronic hepatitis B ([place])  B18.1     Discussion:  This is a 48 year old female with evidence of interstitial pulmonary infiltrates.  Multifactorial etiology.  I suspect this is potentially related to some of her chemotherapy drugs.  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  She also is status post BMT however this was back in 2016.  She is also been treated with different chemotherapeutic regimens however was started on Malva Limes in February 2020.  Plan: I think the next best step would be to complete a repeat high-resolution CT scan of the chest.  Since it has been almost a month since her last imaging.  This will also help [place] delineate where we should best location for tissue sampling and bronchoscopy. Today in the office we discussed the utility of bronchoscopy to help evaluate nonresolving pneumonia in patients that are immunosuppressed.  Due to the patient's language barrier  I believe that it would be best that this procedure is done under anesthesia.  Moderate sedation and being able to communicate with the patient in her own language without interpreter during the procedure would be very very difficult.  Therefore we will start with the CT imaging.  I will also call and discuss case with her primary oncologist Dr. Burr Medico.  Greater than 50% of this patient 60-minute office visit was spent face-to-face discussing the above recommendations and treatment plan as well as review of medical record.    Current Outpatient Medications:  .  acyclovir (ZOVIRAX) 800 MG tablet, Take 1 tablet (800 mg total) by mouth 2 (two) times daily., Disp: 60 tablet, Rfl: 3 .  aspirin 81 MG chewable tablet,  Chew 81 mg by mouth daily., Disp: , Rfl:  .  chlorpheniramine-HYDROcodone (TUSSIONEX) 10-8 MG/5ML SUER, Take 5 mLs by mouth every 12 (twelve) hours as needed for cough., Disp: 140 mL, Rfl: 0 .  feeding supplement, ENSURE ENLIVE, (ENSURE ENLIVE) LIQD, Take 237 mLs by mouth 2 (two) times daily between meals., Disp: 237 mL, Rfl: 12 .  ibuprofen (ADVIL,MOTRIN) 200 MG tablet, Take 200 mg by mouth every 6 (six) hours as needed for moderate pain., Disp: , Rfl:  .  magic mouthwash SOLN, Take 5 mLs by mouth 4 (four) times daily as needed for mouth pain., Disp: 240 mL, Rfl: 3 .  methylPREDNISolone (MEDROL DOSEPAK) 4 MG TBPK tablet, Takes as prescribed per package instruction, Disp: 21 tablet, Rfl: 0 .  omeprazole (PRILOSEC) 20 MG capsule, Take 1 capsule (20 mg total) by mouth daily., Disp: 30 capsule, Rfl: 5 .  ondansetron (ZOFRAN ODT) 8 MG disintegrating tablet, Take 1 tablet (8 mg total) by mouth every 8 (eight) hours as needed for nausea or vomiting., Disp: 40 tablet, Rfl: 1 .  polyethylene glycol (MIRALAX / GLYCOLAX) packet, Take 17 g by mouth daily as needed (constipation)., Disp: 14 each, Rfl: 1 .  pomalidomide (POMALYST) 2 MG capsule, Take 1 capsule (2 mg total) by mouth daily. Take with water on days 1-21. Repeat every 28 days., Disp: 21 capsule, Rfl: 0 .  prochlorperazine (COMPAZINE) 10 MG tablet, Take 1 tablet (10 mg total) by mouth every 6 (six) hours as needed for nausea or vomiting., Disp: 30 tablet, Rfl: 3 .  tenofovir (VIREAD) 300 MG tablet, Take 1 tablet (300 mg total) by mouth daily., Disp: 30 tablet, Rfl: 1 No current facility-administered medications for this visit.   Facility-Administered Medications Ordered in Other Visits:  .  sodium chloride 0.9 % injection 10 mL, 10 mL, Intravenous, PRN, Truitt Merle, MD, 10 mL at 12/11/15 1532 .  sodium chloride 0.9 % injection 10 mL, 10 mL, Intravenous, PRN, Truitt Merle, MD, 10 mL at 06/11/16 1505 .  sodium chloride 0.9 % injection 10 mL, 10 mL,  Intravenous, PRN, Truitt Merle, MD, 10 mL at 08/04/17 1643 .  sodium chloride flush (NS) 0.9 % injection 10 mL, 10 mL, Intravenous, PRN, Truitt Merle, MD, 10 mL at 10/09/15 1717 .  sodium chloride flush (NS) 0.9 % injection 10 mL, 10 mL, Intracatheter, PRN, Truitt Merle, MD, 10 mL at 09/27/18 [place], DO [name] 12/15/2018 3:16 PM

## 2018-12-15 NOTE — Progress Notes (Signed)
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   10/10/2014 Miscellaneous   Peripheral blood chemistry and leukocytosis with total white count 78K, comprised of large plasma cells and his normocytic anemia. There is a myeloid left shift with previous surgical radium blasts. Flow cytometry showed 64% plasma cells   10/10/2014 Bone Marrow Biopsy   Markedly hypercellular marrow (95%), Atypical plasma cells comprise 57% of the cellularity. There was diminished multilineage in hematopoiesis with adequate maturation. Breasts less than 1%), no overt dysplasia of the myeloid or erythroid lineages.    10/10/2014 Initial Diagnosis   Plasma cell leukemia   10/13/2014 - 02/22/2015 Chemotherapy   CyborD (cytoxan '300mg'$ /m2 iv, bortezomib 1.5 mg/m, dexamethasone 40 mg, weekly every 28 days, bortezomib and dexamethasone was given twice weekly for 2 weeks during the first cycle)   04/04/2015 Bone Marrow Transplant   autologous stem cell transplant at [facility]. Her transplant course was complicated by sepsis from Escherichia coli bacteremia and associated colitis, she was discharged home on 04/27/2015.   05/07/2015 - 05/12/2015 Hospital Admission   patient was admitted to [facility] for fever, tachycardia, nausea and abdominal pain. ID  workup was negative, EGD showed evidence of gastritis and duodenitis, no H. pylori or CMV.   05/20/2015 - 05/24/2015 Hospital Admission   patient was admitted to [facility] for sepsis from Escherichia coli UTI.   07/11/2015 Bone Marrow Biopsy   Post transplant 100 a bone marrow biopsy showed hypocellular marrow, 20%, no increase in plasma cells (2%) or other abnormalities.   08/22/2015 - 01/02/2016 Chemotherapy   MaintenanceCyborD (cytoxan '300mg'$ /m2 iv, bortezomib 1.5 mg/m, dexamethasone 40 mg, every 2 weeks, changed to Velcade maintenance after 4 months treatment   01/16/2016 - 04/19/2016 Chemotherapy   Maintenance Velcade 1.3 mg/m every 2 weeks   02/22/2016 Pathology Results   BONE MARROW: Diagnosis Bone Marrow, Aspirate,Biopsy, and Clot, right iliac - NORMOCELLULAR BONE MARROW FOR AGE WITH TRILINEAGE HEMATOPOIESIS. - PLASMACYTOSIS (PLASMA CELLS 12%). - SEE COMMENT. PERIPHERAL BLOOD: - OCCASIONAL CIRCULATING PLASMA CELLS.   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    04/18/2016 - 03/26/2017 Chemotherapy   Daratumumab per protocol  CyBorD every week, cytoxan was held after 04/24/2016 dye to cytopenia and infection  -discontinued due to Hep B flare   05/11/2016 - 05/16/2016 Hospital Admission   Healthcare-associated pneumonia   02/16/2017 - 02/21/2017 Hospital Admission   Admission diagnosis: Abnormal LFT's Additional comments: Assoc diagnoses: Hep B flare, transminitis, dehydration, fever, SIRS   04/06/2017 -  Chemotherapy   Carfilzomib 56 mg/m2 on days 1 and 15(except '20mg'$ /m2 on C1D1 and C1D2)with dexamethasone 20 mg on same days and pomalidomide 2 mg on days 1-21 of 28 day cycleon 04/13/17, dexa stopped on 07/06/2017 due to her CR  -She restarted her Pomalyst in 03/2018 (off for 2-3 months due to insurance, delivery issues)   08/26/2017 - 08/28/2017 Hospital Admission  Admit date: 08/26/2017 Admission diagnosis: RLL Pneumonia      04/19/2018 Bone Marrow Biopsy   Bone Marrow Biopsy 04/19/18 at [facility] Bone Marrow (BM) and Peripheral Blood (PB) FINAL PATHOLOGIC DIAGNOSIS BONE MARROW: Hypocellular marrow (20%) with trilineage hematopoiesis. No [plasma cell myeloma identified. See comment. PERIPHERAL BLOOD: Mild anemia. No circulating plasma cells seen. See CBC data and comment.       CURRENT THERAPY:  Carfilzomib 56 mg/m2 on days 1 and 15(except '20mg'$ /m2 on C1D1 and C1D2)with dexamethasone 20 mg on same days and pomalidomide 2 mg on days 1-21 of 28 day cycleon 04/13/17, dexa stopped on 07/06/2017 due to her CR. Kyprolis held after 10/19 due to pulmonary issue  -Sherestarted herPomalystin 03/2018(off for 2-3 months due to insurance, delivery issues), held since 11/22/18 due to pulmonary issues.   INTERVAL HISTORY:  Sheryl [name] is here for a follow up with her interpretor. She was last seen by [place] on December 06, 2018.  She has completed Levaquin and a Medrol Dosepak.  Her cough has much improved no significant shortness of breath activities.  No fever or chills.  Her main concern is her left neck, shoulder pain and upper arm pain, which did not respond to steroid.  She cannot sleep well at night due to the pain, no significant weakness or other new complaints.  Review of system otherwise negative.   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[place])    Pneumonia    Right ureteral stone    Urosepsis 8/14   admitted to [facility]    SURGICAL HISTORY: Past Surgical History:  Procedure Laterality Date    CYSTOSCOPY W/ URETERAL STENT PLACEMENT Right 09/25/2012   Procedure: CYSTOSCOPY WITH RETROGRADE PYELOGRAM/URETERAL STENT PLACEMENT;  Surgeon: Alexis Frock, MD;  Location: [facility];  Service: Urology;  Laterality: Right;   CYSTOSCOPY WITH RETROGRADE PYELOGRAM, URETEROSCOPY AND STENT PLACEMENT Right 10/15/2012   Procedure: CYSTOSCOPY WITH RETROGRADE PYELOGRAM, URETEROSCOPY AND REMOVAL STENT WITH  STENT PLACEMENT;  Surgeon: Alexis Frock, MD;  Location: [facility];  Service: Urology;  Laterality: Right;   ESOPHAGOGASTRODUODENOSCOPY (EGD) WITH PROPOFOL N/A 11/16/2014   Procedure: ESOPHAGOGASTRODUODENOSCOPY (EGD) WITH PROPOFOL;  Surgeon: Milus Banister, MD;  Location: [facility];  Service: Endoscopy;  Laterality: N/A;   HOLMIUM LASER APPLICATION Right 2/37/6283   Procedure: HOLMIUM LASER APPLICATION;  Surgeon: Alexis Frock, MD;  Location[facility];  Service: Urology;  Laterality: Right;   LIVER BIOPSY     OTHER SURGICAL HISTORY Right    removal of ovarian cyst   removal of uterine cyst     years ago   RIGHT VATS W/ DRAINAGE PEURAL EFFUSION AND BX'S  10-30-2008    I have reviewed the social history and family history with the patient and they are unchanged from previous note.  ALLERGIES:  has No Known Allergies.  MEDICATIONS:  Current Outpatient Medications  Medication Sig Dispense Refill   acyclovir (ZOVIRAX) 800 MG tablet Take 1 tablet (800 mg total) by mouth 2 (two) times daily. 60 tablet 3  aspirin 81 MG chewable tablet Chew 81 mg by mouth daily.     chlorpheniramine-HYDROcodone (TUSSIONEX) 10-8 MG/5ML SUER Take 5 mLs by mouth every 12 (twelve) hours as needed for cough. 140 mL 0   feeding supplement, ENSURE ENLIVE, (ENSURE ENLIVE) LIQD Take 237 mLs by mouth 2 (two) times daily between meals. 237 mL 12   ibuprofen (ADVIL,MOTRIN) 200 MG tablet Take 200 mg by mouth every 6 (six) hours as needed for moderate pain.     magic mouthwash SOLN  Take 5 mLs by mouth 4 (four) times daily as needed for mouth pain. 240 mL 3   omeprazole (PRILOSEC) 20 MG capsule Take 1 capsule (20 mg total) by mouth daily. 30 capsule 5   ondansetron (ZOFRAN ODT) 8 MG disintegrating tablet Take 1 tablet (8 mg total) by mouth every 8 (eight) hours as needed for nausea or vomiting. 40 tablet 1   polyethylene glycol (MIRALAX / GLYCOLAX) packet Take 17 g by mouth daily as needed (constipation). 14 each 1   prochlorperazine (COMPAZINE) 10 MG tablet Take 1 tablet (10 mg total) by mouth every 6 (six) hours as needed for nausea or vomiting. 30 tablet 3   tenofovir (VIREAD) 300 MG tablet Take 1 tablet (300 mg total) by mouth daily. 30 tablet 1   pomalidomide (POMALYST) 2 MG capsule Take 1 capsule (2 mg total) by mouth daily. Take with water on days 1-21. Repeat every 28 days. (Patient not taking: Reported on 12/17/2018) 21 capsule 0   traMADol (ULTRAM) 50 MG tablet Take 1 tablet (50 mg total) by mouth every 6 (six) hours as needed. 30 tablet 0   No current facility-administered medications for this visit.    Facility-Administered Medications Ordered in Other Visits  Medication Dose Route Frequency Provider Last Rate Last Dose   sodium chloride 0.9 % injection 10 mL  10 mL Intravenous PRN Truitt Merle, MD   10 mL at 12/11/15 1532   sodium chloride 0.9 % injection 10 mL  10 mL Intravenous PRN Truitt Merle, MD   10 mL at 06/11/16 1505   sodium chloride 0.9 % injection 10 mL  10 mL Intravenous PRN Truitt Merle, MD   10 mL at 08/04/17 1643   sodium chloride flush (NS) 0.9 % injection 10 mL  10 mL Intravenous PRN Truitt Merle, MD   10 mL at 10/09/15 1717   sodium chloride flush (NS) 0.9 % injection 10 mL  10 mL Intracatheter PRN Truitt Merle, MD   10 mL at 09/27/18 1228    PHYSICAL EXAMINATION: ECOG PERFORMANCE STATUS: 1 - Symptomatic but completely ambulatory  Vitals:   12/17/18 0812  BP: (!) 141/91  Pulse: 85  Resp: 18  Temp: 97.9 F (36.6 C)  SpO2: 100%   Filed  Weights   12/17/18 0812  Weight: 145 lb 8 oz (66 kg)   GENERAL:alert, no distress and comfortable SKIN: skin color, texture, turgor are normal, no rashes or significant lesions EYES: normal, Conjunctiva are pink and non-injected, sclera clear NECK: supple, thyroid normal size, non-tender, without nodularity LYMPH:  no palpable lymphadenopathy in the cervical, axillary  LUNGS: Mild scattered rhonchi on the right lung base, better than last time, no wheezing. HEART: regular rate & rhythm and no murmurs and no lower extremity edema ABDOMEN:abdomen soft, non-tender and normal bowel sounds Musculoskeletal:no cyanosis of digits and no clubbing  NEURO: alert & oriented x 3 with fluent speech, no focal motor/sensory deficits  LABORATORY DATA:  I have reviewed the data as  listed CBC Latest Ref Rng & Units 12/17/2018 12/06/2018 12/02/2018  WBC 4.0 - 10.5 K/uL 5.5 7.0 7.4  Hemoglobin 12.0 - 15.0 g/dL 11.0(L) 11.2(L) 11.8(L)  Hematocrit 36.0 - 46.0 % 35.0(L) 35.4(L) 37.4  Platelets 150 - 400 K/uL 263 215 207     CMP Latest Ref Rng & Units 12/17/2018 12/06/2018 12/02/2018  Glucose 70 - 99 mg/dL 120(H) 96 124(H)  BUN 6 - 20 mg/dL 19 14 26(H)  Creatinine 0.44 - 1.00 mg/dL 0.76 0.67 0.77  Sodium 135 - 145 mmol/L 142 143 143  Potassium 3.5 - 5.1 mmol/L 3.9 3.9 4.2  Chloride 98 - 111 mmol/L 108 109 109  CO2 22 - 32 mmol/L _0 Calcium 8.9 - 10.3 mg/dL 9.0 9.1 9.9  Total Protein 6.5 - 8.1 g/dL 6.7 6.6 7.8  Total Bilirubin 0.3 - 1.2 mg/dL 0.2(L) 0.2(L) 0.3  Alkaline Phos 38 - 126 U/L 88 80 83  AST 15 - 41 U/L _1 ALT 0 - 44 U/L <_2 RADIOGRAPHIC STUDIES: I have personally reviewed the radiological images as listed and agreed with the findings in the report. No results found.   ASSESSMENT & PLAN:  Tationa Stech is a 48 y.o. 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 -She was diagnosed in 10/2014. She was previously treated  with CyborD and bone marrow transplant. She progressed on maintenance Velcade in 02/2016. She was treated with more Dara and CyborD, which was stopped after a severe Hep B flare. -She is currently being treatedwith Carfilzomib q2weeks and Pomalyst 3 weeks on/1 week off.She is tolerating well overall. -[facility]. Dr. Norma Fredrickson reviewed her biopsy and considered her to be in remission. -Shepreviously sawtransplant specialist Dr. Nadara Mustard. She has concern about her risk and overall benefits of procedure. At this point she has declined the transplant and would rather continue current treatment. -due to her respiratory symptoms, and CT evidence of pneumonitis, I have held Pomalyst and Kyprolis since 3-4 weeks ago.  Due to the concern this could be related to chemo treatment, may need to change her treatment -I will discussed with Dr. Norma Fredrickson -Lab reviewed, her recent M protein was negative, CBC and CMP are unremarkable except mild anemia.   2. Atypical pneumonia versus interstitial pneumonitis, 11/21/18 -Chemo has been on hold since -she was referred and seen by Dr. Valeta Harms. Plan to repeat high-resolution CT chest today -She may need a bronchoscopy depending on the CT scan findings  -She received a dose of Medrol dose pack,.  Hypoxia on exertion did not improve, pulse ox dropped from 100% to 91% after walking.   3.Cervical radiculopathy, worse lately  -Ongoingsince late 12/2017 butgot worsein early 2020and again in Nov 2020 -She denies history of neck trauma or injury -Her MRI Spine from 03/09/18 showedmild cervical disk bulging, her pain is related to the radiculopathy -she wasevaluated by neurologist Dr. Mickeal Skinner in our cancer center, and received a 5 days of steroids, rsolved -she did not respond to steroid this time -I will call in tramadol for her pain, and let her f/u with Dr. Mickeal Skinner next week   4.Chronichepatitis B  infection -Shehas knownchronichepatitis B infection,developed significant transaminitis and hyperbilirubinemiainJanuary 2019 -Sherestartedher Heb Btreatmentin 02/2017, controlled now -She has not been see by Dr. Baxter Flattery in about 1 year. I gave her contact information to contact and set up appointment.   5. Type 2 diabetes,  steroids induced -Previouslytreated with Metformin.No longer ondexamethasone. -Has much improvedand stable. Better controlled lately.  6. GERD, history of GI bleeding and nausea -Colonoscopy on 11/26/16 per Dr. Ardis Hughs notable for a polyp in the transverse colon, revealed to be tubular adenoma and negative for high grade dysplasia or malignancy.  -Continue omeprazole daily. We previously discussed steroids can worsen her acid reflux -Nauseacurrently minimal.  Plan -continue to hold chemo for now -she has CT chest scheduled this afternoon -I called in tramadol for her left shoulder pain, and will refer her back to see Dr. Mickeal Skinner -f/u next Thursday 11/19, may change her chemo treatment, will discuss with Dr. Norma Fredrickson    No problem-specific Assessment & Plan notes found for this encounter.   No orders of the defined types were placed in this encounter.  All questions were answered. The patient knows to call the clinic with any problems, questions or concerns. No barriers to learning was detected. I spent 25 minutes counseling the patient face to face. The total time spent in the appointment was 30 minutes and more than 50% was on counseling and review of test results     Truitt Merle, MD 12/17/2018   I, Joslyn Devon, am acting as scribe for Truitt Merle, MD.   I have reviewed the above documentation for accuracy and completeness, and I agree with the above.

## 2018-12-16 ENCOUNTER — Other Ambulatory Visit: Payer: Self-pay

## 2018-12-16 DIAGNOSIS — C9011 Plasma cell leukemia in remission: Secondary | ICD-10-CM

## 2018-12-17 ENCOUNTER — Ambulatory Visit (HOSPITAL_COMMUNITY): Payer: Medicaid Other

## 2018-12-17 ENCOUNTER — Inpatient Hospital Stay (HOSPITAL_BASED_OUTPATIENT_CLINIC_OR_DEPARTMENT_OTHER): Payer: Medicaid Other | Admitting: Hematology

## 2018-12-17 ENCOUNTER — Inpatient Hospital Stay: Payer: Medicaid Other

## 2018-12-17 ENCOUNTER — Other Ambulatory Visit: Payer: Self-pay

## 2018-12-17 ENCOUNTER — Other Ambulatory Visit: Payer: Self-pay | Admitting: Pharmacist

## 2018-12-17 ENCOUNTER — Institutional Professional Consult (permissible substitution): Payer: Medicaid Other | Admitting: Pulmonary Disease

## 2018-12-17 ENCOUNTER — Encounter: Payer: Self-pay | Admitting: Hematology

## 2018-12-17 ENCOUNTER — Telehealth: Payer: Self-pay

## 2018-12-17 ENCOUNTER — Telehealth: Payer: Self-pay | Admitting: Hematology

## 2018-12-17 VITALS — BP 141/91 | HR 85 | Temp 97.9°F | Resp 18 | Ht 63.0 in | Wt 145.5 lb

## 2018-12-17 DIAGNOSIS — B181 Chronic viral hepatitis B without delta-agent: Secondary | ICD-10-CM

## 2018-12-17 DIAGNOSIS — C9011 Plasma cell leukemia in remission: Secondary | ICD-10-CM

## 2018-12-17 DIAGNOSIS — D649 Anemia, unspecified: Secondary | ICD-10-CM | POA: Diagnosis not present

## 2018-12-17 DIAGNOSIS — C9010 Plasma cell leukemia not having achieved remission: Secondary | ICD-10-CM | POA: Diagnosis not present

## 2018-12-17 DIAGNOSIS — C9012 Plasma cell leukemia in relapse: Secondary | ICD-10-CM

## 2018-12-17 LAB — CBC WITH DIFFERENTIAL (CANCER CENTER ONLY)
Abs Immature Granulocytes: 0.04 10*3/uL (ref 0.00–0.07)
Basophils Absolute: 0 10*3/uL (ref 0.0–0.1)
Basophils Relative: 0 %
Eosinophils Absolute: 0.1 10*3/uL (ref 0.0–0.5)
Eosinophils Relative: 3 %
HCT: 35 % — ABNORMAL LOW (ref 36.0–46.0)
Hemoglobin: 11 g/dL — ABNORMAL LOW (ref 12.0–15.0)
Immature Granulocytes: 1 %
Lymphocytes Relative: 20 %
Lymphs Abs: 1.1 10*3/uL (ref 0.7–4.0)
MCH: 29.6 pg (ref 26.0–34.0)
MCHC: 31.4 g/dL (ref 30.0–36.0)
MCV: 94.1 fL (ref 80.0–100.0)
Monocytes Absolute: 0.4 10*3/uL (ref 0.1–1.0)
Monocytes Relative: 8 %
Neutro Abs: 3.8 10*3/uL (ref 1.7–7.7)
Neutrophils Relative %: 68 %
Platelet Count: 263 10*3/uL (ref 150–400)
RBC: 3.72 MIL/uL — ABNORMAL LOW (ref 3.87–5.11)
RDW: 13.2 % (ref 11.5–15.5)
WBC Count: 5.5 10*3/uL (ref 4.0–10.5)
nRBC: 0 % (ref 0.0–0.2)

## 2018-12-17 LAB — CMP (CANCER CENTER ONLY)
ALT: <6 U/L (ref 0–44)
AST: 19 U/L (ref 15–41)
Albumin: 3.3 g/dL — ABNORMAL LOW (ref 3.5–5.0)
Alkaline Phosphatase: 88 U/L (ref 38–126)
Anion gap: 11 (ref 5–15)
BUN: 19 mg/dL (ref 6–20)
CO2: 23 mmol/L (ref 22–32)
Calcium: 9 mg/dL (ref 8.9–10.3)
Chloride: 108 mmol/L (ref 98–111)
Creatinine: 0.76 mg/dL (ref 0.44–1.00)
GFR, Est AFR Am: >60 mL/min (ref >60)
GFR, Estimated: >60 mL/min (ref >60)
Glucose, Bld: 120 mg/dL — ABNORMAL HIGH (ref 70–99)
Potassium: 3.9 mmol/L (ref 3.5–5.1)
Sodium: 142 mmol/L (ref 135–145)
Total Bilirubin: 0.2 mg/dL — ABNORMAL LOW (ref 0.3–1.2)
Total Protein: 6.7 g/dL (ref 6.5–8.1)

## 2018-12-17 MED ORDER — HEPARIN SOD (PORK) LOCK FLUSH 100 UNIT/ML IV SOLN
500.0000 [IU] | Freq: Once | INTRAVENOUS | Status: AC
  Administered 2018-12-17: 09:00:00 500 [IU] via INTRAVENOUS
  Filled 2018-12-17: qty 5

## 2018-12-17 MED ORDER — TRAMADOL HCL 50 MG PO TABS: 50.0000 mg | ORAL_TABLET | Freq: Four times a day (QID) | ORAL | 0 refills | Status: DC | PRN

## 2018-12-17 MED FILL — OMEPRAZOLE 20 MG CAP: 20 | 30 days supply | Qty: 30 | Fill #5

## 2018-12-17 MED FILL — traMADol HCL 50 MG TABS: 50 | 7 days supply | Qty: 30 | Fill #0

## 2018-12-17 NOTE — Progress Notes (Signed)
urine

## 2018-12-17 NOTE — Telephone Encounter (Signed)
Changed appt per secure chat.  No treatment needs to be added for 11/19.  Md stated she might change the pt treatment.  Spoke with pt daughter and she is aware of the appt date and time

## 2018-12-17 NOTE — Telephone Encounter (Signed)
Scheduled appt per 11/12.  Sent treatment as an add-on.  Will contact patient once treatment has been added

## 2018-12-17 NOTE — Telephone Encounter (Signed)
Attempted to call patient to schedule office visit with MD. Karen Kays to reach patient at this time. Left vm requesting call back. Message left with assistance from specific interpreters. Alyse Low  ID: [id number] Aundria Rud, [place]

## 2018-12-17 NOTE — Progress Notes (Signed)
Patient ambulated for several minutes O2 Sat 96 dropping to 91%. Dr. Burr Medico aware.

## 2018-12-20 ENCOUNTER — Inpatient Hospital Stay: Payer: Medicaid Other

## 2018-12-20 ENCOUNTER — Inpatient Hospital Stay: Payer: Medicaid Other | Admitting: Hematology

## 2018-12-21 ENCOUNTER — Telehealth: Payer: Self-pay | Admitting: *Deleted

## 2018-12-21 ENCOUNTER — Telehealth: Payer: Self-pay

## 2018-12-21 NOTE — Telephone Encounter (Signed)
-----   Message from Garner Nash, DO sent at 12/21/2018  9:29 AM EST ----- Regarding: FW: FYI  May need to follow up  Brad ----- Message ----- From: Truitt Merle, MD Sent: 12/21/2018   9:26 AM EST To: Jonnie Finner, RN, Jesse Fall, RN, #  My nurse,  She was supposed to have CT chest last Friday afternoon, I told her when I saw her last Friday morning. But CT was not done, could you call pt's daughter to find out what happened? Let Dr. Juline Patch office know if she needs reschedule  Thanks  Krista Blue

## 2018-12-21 NOTE — Telephone Encounter (Signed)
Called daughter, [name] to see why pt didn't get CT done last fri.  She states that they went but were told that it was cancelled.  Called & spoke to [place] notes say that it was not authorized. Will send message to Dr Valeta Harms to r/s.

## 2018-12-21 NOTE — Telephone Encounter (Signed)
-----   Message from Truitt Merle, MD sent at 12/21/2018  9:26 AM EST ----- My nurse,  She was supposed to have CT chest last Friday afternoon, I told her when I saw her last Friday morning. But CT was not done, could you call pt's daughter to find out what happened? Let Dr. Juline Patch office know if she needs reschedule  Thanks  Krista Blue

## 2018-12-22 ENCOUNTER — Telehealth: Payer: Self-pay

## 2018-12-22 NOTE — Telephone Encounter (Signed)
Left message for patient's daughter [name] asking why her mother did not go for the CT of the chest scan on Friday 11/13 as scheduled. She was reminded of this appointment when she was seen in the office on Friday morning.  I asked that she call me back and let me know what is going on and if rescheduled is her mother willing to go.

## 2018-12-22 NOTE — Progress Notes (Signed)
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   10/10/2014 Miscellaneous   Peripheral blood chemistry and leukocytosis with total white count 78K, comprised of large plasma cells and his normocytic anemia. There is a myeloid left shift with previous surgical radium blasts. Flow cytometry showed 64% plasma cells   10/10/2014 Bone Marrow Biopsy   Markedly hypercellular marrow (95%), Atypical plasma cells comprise 57% of the cellularity. There was diminished multilineage in hematopoiesis with adequate maturation. Breasts less than 1%), no overt dysplasia of the myeloid or erythroid lineages.    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   05/07/2015 - 05/12/2015 Hospital Admission   [facility], [facility] ID  workup was negative, EGD showed evidence of gastritis and duodenitis, no H. pylori or CMV.   05/20/2015 - 05/24/2015 Hospital Admission   [facility]   07/11/2015 Bone Marrow Biopsy   Post transplant 100 a bone marrow biopsy showed hypocellular marrow, 20%, no increase in plasma cells (2%) or other abnormalities.   08/22/2015 - 01/02/2016 Chemotherapy   MaintenanceCyborD (cytoxan 3059mm2 iv, bortezomib 1.5 mg/m, dexamethasone 40 mg, every 2 weeks, changed to Velcade maintenance after 4 months treatment   01/16/2016 - 04/19/2016 Chemotherapy   Maintenance Velcade 1.3 mg/m every 2 weeks   02/22/2016 Pathology Results   BONE MARROW: Diagnosis Bone Marrow, Aspirate,Biopsy, and Clot, right iliac - NORMOCELLULAR BONE MARROW FOR AGE WITH TRILINEAGE HEMATOPOIESIS. - PLASMACYTOSIS (PLASMA CELLS 12%). - SEE COMMENT. PERIPHERAL BLOOD: - OCCASIONAL CIRCULATING PLASMA CELLS.   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    04/18/2016 - 03/26/2017 Chemotherapy   Daratumumab per protocol  CyBorD every week, cytoxan was held after 04/24/2016 dye to cytopenia and infection  -discontinued due to Hep B flare   05/11/2016 - 05/16/2016 Hospital Admission   Healthcare-associated pneumonia   02/16/2017 - 02/21/2017 Hospital Admission   Admission diagnosis: Abnormal LFT's Additional comments: Assoc diagnoses: Hep B flare, transminitis, dehydration, fever, SIRS   04/06/2017 - 11/22/2018 Chemotherapy   Carfilzomib 56 mg/m2 on days 1 and 15 (except 208m2 on C1D1 and C1D2) with dexamethasone 20 mg on same days and pomalidomide 2 mg on days 1-21 of 28 day cycle on 04/13/17, dexa stopped on 07/06/2017 due to her CR  -She restarted her Pomalyst in 03/2018 (off for 2-3 months due to insurance, delivery issues),  held since 11/22/18  due to pulmonary issues.    08/26/2017 - 08/28/2017 Hospital Admission   Admit  date: 08/26/2017 Admission diagnosis: RLL Pneumonia    04/19/2018 Bone Marrow Biopsy   Bone Marrow Biopsy 04/19/18 at [facility] Bone Marrow (BM) and Peripheral Blood (PB) FINAL PATHOLOGIC DIAGNOSIS BONE MARROW: Hypocellular marrow (20%) with trilineage hematopoiesis. No [plasma cell myeloma identified. See comment. PERIPHERAL BLOOD: Mild anemia. No circulating plasma cells seen. See CBC data and comment.       CURRENT THERAPY:  Carfilzomib 56 mg/m2 on days 1 and 15(except 76m/m2 on C1D1 and C1D2)with dexamethasone 20 mg on same days and pomalidomide 2 mg on days 1-21 of 28 day cycleon 04/13/17, dexa stopped on 07/06/2017 due to her CR. Kyprolis held after 10/19 due to pulmonary issue  -Sherestarted herPomalystin 03/2018(off for 2-3 months due to insurance, delivery issues), held since 11/22/18 due to pulmonary issues.   INTERVAL HISTORY:  Sheryl [name] is here for a follow up with her interpretor. She presents to the clinic alone. She notes her CT chest has not been rescheduled yet. She notes since last week her energy is stable and still has left shoulder pain. She notes this pain radiates down her left arm. She did try tramadol with no relief. She plans to see Dr VMickeal Skinnertoday. She notes occasional cough and no more throat pain. She feels she is eating and drinking well currently, 1L a day.    REVIEW OF SYSTEMS:   Constitutional: Denies fevers, chills or abnormal weight loss Eyes: Denies blurriness of vision Ears, nose, mouth, throat, and face: Denies mucositis or sore throat Respiratory: Denies  dyspnea or wheezes (+) residual cough.  Cardiovascular: Denies palpitation, chest discomfort or lower extremity swelling Gastrointestinal:  Denies nausea, heartburn or change in bowel habits Skin: Denies abnormal skin rashes MSK: (+) Stable left shoulder pain, stable and consistent  Lymphatics: Denies new lymphadenopathy or easy bruising Neurological:Denies  numbness, tingling or new weaknesses Behavioral/Psych: Mood is stable, no new changes  All other systems were reviewed with the patient and are negative.  MEDICAL HISTORY:  Past Medical History:  Diagnosis Date  . Chills with fever    intermittently since d/c from hospital  . Dysuria-frequency syndrome    w/ pink urine  . GERD (gastroesophageal reflux disease)   . Hepatitis   . History of positive PPD    DX 2011--  CXR DONE NO EVIDENCE  . History of ureter stent   . Hydronephrosis, right   . Neuromuscular disorder ([facility])    legs numb intermittently  . 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   . Pneumonia   . Right ureteral stone   . Urosepsis 8/14   ad[facility] to [facility]    SURGICAL HISTORY: Past Surgical History:  Procedure Laterality Date  . CYSTOSCOPY W/ URETERAL STENT PLACEMENT Right 09/25/2012   Procedure: CYSTOSCOPY WITH RETROGRADE PYELOGRAM/URETERAL STENT PLACEMENT;  Surgeon: TAlexis Frock MD;  Location: [facility];  Service: Urology;  Laterality: Right;  . CYSTOSCOPY WITH RETROGRADE PYELOGRAM, URETEROSCOPY AND STENT PLACEMENT Right 10/15/2012   Procedure: CYSTOSCOPY WITH RETROGRADE PYELOGRAM, URETEROSCOPY AND REMOVAL STENT WITH  STENT PLACEMENT;  Surgeon: TAlexis Frock MD;  [facility]  Service: Urology;  Laterality: Right;  . ESOPHAGOGASTRODUODENOSCOPY (EGD) WITH PROPOFOL N/A 11/16/2014   Procedure: ESOPHAGOGASTRODUODENOSCOPY (EGD) WITH PROPOFOL;  Surgeon: DMilus Banister MD;  Location: [facility];  Service: Endoscopy;  Laterality: N/A;  . HOLMIUM LASER APPLICATION Right 99/79/4801  Procedure: HOLMIUM LASER APPLICATION;  Surgeon: TAlexis Frock MD;  Location:  [place];  Service: Urology;  Laterality: Right;  . LIVER BIOPSY    . OTHER SURGICAL HISTORY Right    removal of ovarian cyst  . removal of uterine cyst     years ago  . RIGHT VATS W/ DRAINAGE PEURAL EFFUSION AND BX'S  10-30-2008    I have reviewed the social history and family  history with the patient and they are unchanged from previous note.  ALLERGIES:  has No Known Allergies.  MEDICATIONS:  Current Outpatient Medications  Medication Sig Dispense Refill  . acyclovir (ZOVIRAX) 800 MG tablet Take 1 tablet (800 mg total) by mouth 2 (two) times daily. 60 tablet 3  . aspirin 81 MG chewable tablet Chew 81 mg by mouth daily.    . chlorpheniramine-HYDROcodone (TUSSIONEX) 10-8 MG/5ML SUER Take 5 mLs by mouth every 12 (twelve) hours as needed for cough. 140 mL 0  . feeding supplement, ENSURE ENLIVE, (ENSURE ENLIVE) LIQD Take 237 mLs by mouth 2 (two) times daily between meals. 237 mL 12  . ibuprofen (ADVIL,MOTRIN) 200 MG tablet Take 200 mg by mouth every 6 (six) hours as needed for moderate pain.    . magic mouthwash SOLN Take 5 mLs by mouth 4 (four) times daily as needed for mouth pain. 240 mL 3  . omeprazole (PRILOSEC) 20 MG capsule Take 1 capsule (20 mg total) by mouth daily. 30 capsule 5  . ondansetron (ZOFRAN ODT) 8 MG disintegrating tablet Take 1 tablet (8 mg total) by mouth every 8 (eight) hours as needed for nausea or vomiting. 40 tablet 1  . polyethylene glycol (MIRALAX / GLYCOLAX) packet Take 17 g by mouth daily as needed (constipation). 14 each 1  . pomalidomide (POMALYST) 2 MG capsule Take 1 capsule (2 mg total) by mouth daily. Take with water on days 1-21. Repeat every 28 days. 21 capsule 0  . prochlorperazine (COMPAZINE) 10 MG tablet Take 1 tablet (10 mg total) by mouth every 6 (six) hours as needed for nausea or vomiting. 30 tablet 3  . tenofovir (VIREAD) 300 MG tablet Take 1 tablet (300 mg total) by mouth daily. (Patient not taking: Reported on 12/23/2018) 30 tablet 1  . traMADol (ULTRAM) 50 MG tablet Take 1 tablet (50 mg total) by mouth every 6 (six) hours as needed. 30 tablet 0  . dexamethasone (DECADRON) 4 MG tablet Take 1 tablet (4 mg total) by mouth daily. 5 tablet 0   No current facility-administered medications for this visit.     Facility-Administered Medications Ordered in Other Visits  Medication Dose Route Frequency Provider Last Rate Last Dose  . sodium chloride 0.9 % injection 10 mL  10 mL Intravenous PRN Truitt Merle, MD   10 mL at 12/11/15 1532  . sodium chloride 0.9 % injection 10 mL  10 mL Intravenous PRN Truitt Merle, MD   10 mL at 06/11/16 1505  . sodium chloride 0.9 % injection 10 mL  10 mL Intravenous PRN Truitt Merle, MD   10 mL at 08/04/17 1643  . sodium chloride flush (NS) 0.9 % injection 10 mL  10 mL Intravenous PRN Truitt Merle, MD   10 mL at 10/09/15 1717  . sodium chloride flush (NS) 0.9 % injection 10 mL  10 mL Intracatheter PRN Truitt Merle, MD   10 mL at 09/27/18 1228    PHYSICAL EXAMINATION: ECOG PERFORMANCE STATUS: 1 - Symptomatic but completely ambulatory  Vitals:   12/23/18 1135  BP: (!) 141/91  Pulse: 86  Resp: 20  Temp: 97.7 F (36.5 C)  SpO2: 100%   Filed Weights   12/23/18 1135  Weight: 144 lb 11.2 oz (65.6 kg)    GENERAL:alert, no distress and comfortable SKIN: skin color, texture, turgor are normal, no rashes or significant lesions EYES: normal, Conjunctiva are pink and non-injected, sclera clear  NECK: supple, thyroid normal size, non-tender, without nodularity LYMPH:  no palpable lymphadenopathy in the cervical, axillary  LUNGS: clear to auscultation and percussion with normal breathing effort HEART: regular rate & rhythm and no murmurs and no lower extremity edema ABDOMEN:abdomen soft, non-tender and normal bowel sounds Musculoskeletal:no cyanosis of digits and no clubbing  NEURO: alert & oriented x 3 with fluent speech, no focal motor/sensory deficits  LABORATORY DATA:  I have reviewed the data as listed CBC Latest Ref Rng & Units 12/23/2018 12/17/2018 12/06/2018  WBC 4.0 - 10.5 K/uL 7.7 5.5 7.0  Hemoglobin 12.0 - 15.0 g/dL 11.2(L) 11.0(L) 11.2(L)  Hematocrit 36.0 - 46.0 % 35.4(L) 35.0(L) 35.4(L)  Platelets 150 - 400 K/uL 233 263 215     CMP Latest Ref Rng & Units  12/23/2018 12/17/2018 12/06/2018  Glucose 70 - 99 mg/dL 93 120(H) 96  BUN 6 - 20 mg/dL 29(H) 19 14  Creatinine 0.44 - 1.00 mg/dL 0.79 0.76 0.67  Sodium 135 - 145 mmol/L 141 142 143  Potassium 3.5 - 5.1 mmol/L 3.7 3.9 3.9  Chloride 98 - 111 mmol/L 106 108 109  CO2 22 - 32 mmol/L '26 23 26  ' Calcium 8.9 - 10.3 mg/dL 9.4 9.0 9.1  Total Protein 6.5 - 8.1 g/dL 7.2 6.7 6.6  Total Bilirubin 0.3 - 1.2 mg/dL 0.3 0.2(L) 0.2(L)  Alkaline Phos 38 - 126 U/L 91 88 80  AST 15 - 41 U/L '17 19 22  ' ALT 0 - 44 U/L 6 <6 8      RADIOGRAPHIC STUDIES: I have personally reviewed the radiological images as listed and agreed with the findings in the report. No results found.   ASSESSMENT & PLAN:  Sheryl [name] is a 48 y.o. 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 -She was diagnosed in 10/2014. She was previously treated with CyborD and bone marrow transplant. She progressed on maintenance Velcade in 02/2016. She was treated with more Dara and CyborD, which was stopped after a severe Hep B flare. -She is currently being treatedwith Carfilzomib q2weeks and Pomalyst 3 weeks on/1 week off.She is tolerating well overall. -[facility] Dr. Norma Fredrickson reviewed her biopsy and considered her to be in remission. -Shepreviouslysawtransplant specialist Dr. Nadara Mustard. She has concern about her risk and overall benefits of procedure. At this point she has declined the transplant and would rather continue current treatment. -Due to her respiratory symptoms, and CT evidence of pneumonitis, I have held Pomalyst and Kyprolis since 11/21/18. Due to the concern this could be related to chemo treatment, may need to change her treatment. I spoke with Dr. Debbrah Alar and he recommends weekly Selinexor. Will hold on treatment for now until next visit    -She notes this week she is overall stable with no change in her moderate left  shoulder pain. She will see Dr. Mickeal Skinner today for further evaluation. She has residual cough remaining from her URI. Labs reviewed and stable. Physical exam normal.  -f/u with me in 2 weeks    2. Atypical pneumonia versus interstitial pneumonitis, 11/21/18, possible related to chemo  -Chemo has been on hold since.  -She  received a dose of Medrol pack. She is clinically better, lung exam also improved today  -She was referred and seen by Dr. Valeta Harms. CT chest was not approved by insurance and was postponed. She plans to reschedule soon. She may need a bronchoscopy depending on the CT scan findings  -She has only residual cough remaining from her URI. Continues to improve clinically   3.Cervical radiculopathy, worse lately  -Ongoingsince late 12/2017 butgot worsein early 2020and again in Nov 2020 -She denies history of neck trauma or injury -Her MRI Spine from 03/09/18 showedmild cervical disk bulging, her pain is related to the radiculopathy -She wasevaluated by neurologist Dr. Mickeal Skinner in our cancer center, and received a 5 days of steroids,but she did not respond to recent round of steroids.  -I previously started her on Tramadol on 12/17/18. This gave her no relief.  -She will see Dr. Mickeal Skinner today.    4.Chronichepatitis B infection -Shehas knownchronichepatitis B infection,developed significant transaminitis and hyperbilirubinemiainJanuary 2019 -Sherestartedher Heb Btreatmentin 02/2017, controlled now -She has not been see by Dr. Baxter Flattery in about 1 year. I gave her contact information to contact and set up appointment.  5. Type 2 diabetes, steroids induced -Previouslytreated with Metformin.No longer ondexamethasone. -Has much improvedand stable. Better controlled lately.  6. GERD, history of GI bleeding and nausea -Colonoscopy on 11/26/16 per Dr. Ardis Hughs notable for a polyp in the transverse colon, revealed to be tubular adenoma and negative for high grade  dysplasia or malignancy.  -Continue omeprazole daily. We previously discussed steroids can worsen her acid reflux -Nauseacurrently minimal.Stable.   Plan -continue to hold chemo for now. She is mostly stable.  -Proceed to see Dr. Mickeal Skinner today  -Lab and f/u with me in 2 weeks    No problem-specific Assessment & Plan notes found for this encounter.   No orders of the defined types were placed in this encounter.  All questions were answered. The patient knows to call the clinic with any problems, questions or concerns. No barriers to learning was detected. I spent 15 minutes counseling the patient face to face. The total time spent in the appointment was 20 minutes and more than 50% was on counseling and review of test results     Truitt Merle, MD 12/23/2018   I, Joslyn Devon, am acting as scribe for Truitt Merle, MD.   I have reviewed the above documentation for accuracy and completeness, and I agree with the above.

## 2018-12-22 NOTE — Telephone Encounter (Signed)
PCCM:  I will forward to our PCCs to have them follow up.   Thanks  Garner Nash, DO [facility] 12/22/2018 8:38 AM

## 2018-12-23 ENCOUNTER — Inpatient Hospital Stay (HOSPITAL_BASED_OUTPATIENT_CLINIC_OR_DEPARTMENT_OTHER): Payer: Medicaid Other | Admitting: Hematology

## 2018-12-23 ENCOUNTER — Inpatient Hospital Stay: Payer: Medicaid Other

## 2018-12-23 ENCOUNTER — Inpatient Hospital Stay (HOSPITAL_BASED_OUTPATIENT_CLINIC_OR_DEPARTMENT_OTHER): Payer: Medicaid Other | Admitting: Internal Medicine

## 2018-12-23 ENCOUNTER — Encounter: Payer: Self-pay | Admitting: Hematology

## 2018-12-23 ENCOUNTER — Other Ambulatory Visit: Payer: Medicaid Other

## 2018-12-23 ENCOUNTER — Other Ambulatory Visit: Payer: Self-pay

## 2018-12-23 ENCOUNTER — Ambulatory Visit: Payer: Medicaid Other | Admitting: Nurse Practitioner

## 2018-12-23 VITALS — BP 141/91 | HR 71 | Temp 97.9°F | Resp 18 | Ht 63.0 in | Wt 143.3 lb

## 2018-12-23 VITALS — BP 141/91 | HR 86 | Temp 97.7°F | Resp 20 | Ht 63.0 in | Wt 144.7 lb

## 2018-12-23 DIAGNOSIS — C9012 Plasma cell leukemia in relapse: Secondary | ICD-10-CM

## 2018-12-23 DIAGNOSIS — M5412 Radiculopathy, cervical region: Secondary | ICD-10-CM

## 2018-12-23 DIAGNOSIS — C9010 Plasma cell leukemia not having achieved remission: Secondary | ICD-10-CM | POA: Diagnosis not present

## 2018-12-23 LAB — CBC WITH DIFFERENTIAL (CANCER CENTER ONLY)
Abs Immature Granulocytes: 0.03 10*3/uL (ref 0.00–0.07)
Basophils Absolute: 0 10*3/uL (ref 0.0–0.1)
Basophils Relative: 0 %
Eosinophils Absolute: 0 10*3/uL (ref 0.0–0.5)
Eosinophils Relative: 0 %
HCT: 35.4 % — ABNORMAL LOW (ref 36.0–46.0)
Hemoglobin: 11.2 g/dL — ABNORMAL LOW (ref 12.0–15.0)
Immature Granulocytes: 0 %
Lymphocytes Relative: 24 %
Lymphs Abs: 1.9 10*3/uL (ref 0.7–4.0)
MCH: 30 pg (ref 26.0–34.0)
MCHC: 31.6 g/dL (ref 30.0–36.0)
MCV: 94.9 fL (ref 80.0–100.0)
Monocytes Absolute: 0.7 10*3/uL (ref 0.1–1.0)
Monocytes Relative: 9 %
Neutro Abs: 5.1 10*3/uL (ref 1.7–7.7)
Neutrophils Relative %: 67 %
Platelet Count: 233 10*3/uL (ref 150–400)
RBC: 3.73 MIL/uL — ABNORMAL LOW (ref 3.87–5.11)
RDW: 13.9 % (ref 11.5–15.5)
WBC Count: 7.7 10*3/uL (ref 4.0–10.5)
nRBC: 0 % (ref 0.0–0.2)

## 2018-12-23 LAB — CMP (CANCER CENTER ONLY)
ALT: 6 U/L (ref 0–44)
AST: 17 U/L (ref 15–41)
Albumin: 3.6 g/dL (ref 3.5–5.0)
Alkaline Phosphatase: 91 U/L (ref 38–126)
Anion gap: 9 (ref 5–15)
BUN: 29 mg/dL — ABNORMAL HIGH (ref 6–20)
CO2: 26 mmol/L (ref 22–32)
Calcium: 9.4 mg/dL (ref 8.9–10.3)
Chloride: 106 mmol/L (ref 98–111)
Creatinine: 0.79 mg/dL (ref 0.44–1.00)
GFR, Est AFR Am: >60 mL/min (ref >60)
GFR, Estimated: >60 mL/min (ref >60)
Glucose, Bld: 93 mg/dL (ref 70–99)
Potassium: 3.7 mmol/L (ref 3.5–5.1)
Sodium: 141 mmol/L (ref 135–145)
Total Bilirubin: 0.3 mg/dL (ref 0.3–1.2)
Total Protein: 7.2 g/dL (ref 6.5–8.1)

## 2018-12-23 LAB — PREGNANCY, URINE: Preg Test, Ur: NEGATIVE

## 2018-12-23 MED ORDER — HEPARIN SOD (PORK) LOCK FLUSH 100 UNIT/ML IV SOLN
500.0000 [IU] | Freq: Once | INTRAVENOUS | Status: AC
  Administered 2018-12-23: 500 [IU] via INTRAVENOUS
  Filled 2018-12-23: qty 5

## 2018-12-23 MED ORDER — DEXAMETHASONE 4 MG PO TABS: 4.0000 mg | ORAL_TABLET | Freq: Every day | ORAL | 0 refills | Status: DC

## 2018-12-23 MED FILL — DEXAMETHASONE 4 MG TABLET: 4 | 5 days supply | Qty: 5 | Fill #0

## 2018-12-23 NOTE — Progress Notes (Signed)
[facility] at [address] [place], [place] [address] [phone]   Interval Evaluation  Date of Service: 12/23/18 Patient Name: Sheryl [name] Patient MRN: [id number] Patient DOB: 07-31-1970 Provider: Ventura Sellers, MD  Identifying Statement:  Sheryl [name] is a 48 y.o. 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   10/10/2014 Miscellaneous   Peripheral blood chemistry and leukocytosis with total white count 78K, comprised of large plasma cells and his normocytic anemia. There is a myeloid left shift with previous surgical radium blasts. Flow cytometry showed 64% plasma cells   10/10/2014 Bone Marrow Biopsy   Markedly hypercellular marrow (95%), Atypical plasma cells comprise 57% of the cellularity. There was diminished multilineage in hematopoiesis with adequate maturation. Breasts less than 1%), no overt dysplasia of the myeloid or erythroid lineages.    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   05/07/2015 - 05/12/2015 Hospital Admission   [facility], [facility] ID workup was negative, EGD showed evidence of gastritis and duodenitis, no H. pylori or CMV.   05/20/2015 - 05/24/2015 Hospital Admission   [facility]   07/11/2015 Bone Marrow Biopsy   Post transplant 100 a bone marrow biopsy showed hypocellular marrow, 20%, no increase in plasma cells (2%) or other abnormalities.   08/22/2015 - 01/02/2016 Chemotherapy   MaintenanceCyborD (cytoxan 3064mm2 iv, bortezomib 1.5 mg/m, dexamethasone 40 mg, every 2 weeks, changed to Velcade maintenance after 4 months treatment   01/16/2016 - 04/19/2016 Chemotherapy   Maintenance Velcade 1.3 mg/m every 2 weeks   02/22/2016 Pathology Results   BONE MARROW: Diagnosis Bone Marrow, Aspirate,Biopsy, and Clot, right iliac - NORMOCELLULAR BONE MARROW FOR AGE WITH TRILINEAGE HEMATOPOIESIS. - PLASMACYTOSIS (PLASMA CELLS 12%). - SEE COMMENT. PERIPHERAL BLOOD: - OCCASIONAL CIRCULATING PLASMA CELLS.   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    04/18/2016 - 03/26/2017 Chemotherapy   Daratumumab per protocol  CyBorD every week, cytoxan was held after 04/24/2016 dye to cytopenia and infection  -discontinued due to Hep B flare   05/11/2016 - 05/16/2016 Hospital Admission   Healthcare-associated pneumonia   02/16/2017 - 02/21/2017 Hospital Admission   Admission diagnosis: Abnormal LFT's Additional comments: Assoc diagnoses: Hep B flare, transminitis, dehydration, fever, SIRS   04/06/2017 - 11/22/2018 Chemotherapy   Carfilzomib 56 mg/m2 on days 1 and 15 (except 2018m2 on C1D1 and C1D2) with dexamethasone 20 mg on same days and pomalidomide 2 mg on days 1-21 of 28 day cycle on 04/13/17, dexa stopped on 07/06/2017 due to her CR  -She restarted her Pomalyst in 03/2018 (off for 2-3 months due to insurance, delivery issues),  held since 11/22/18 due to pulmonary issues.    08/26/2017 - 08/28/2017 Hospital Admission   Admit date: 08/26/2017 Admission diagnosis: RLL Pneumonia  04/19/2018 Bone Marrow Biopsy   Bone Marrow Biopsy 04/19/18 at [facility] Bone Marrow (BM) and Peripheral Blood (PB)  FINAL PATHOLOGIC DIAGNOSIS BONE MARROW: Hypocellular marrow (20%) with trilineage hematopoiesis. No [plasma cell myeloma identified. See comment. PERIPHERAL BLOOD: Mild anemia. No circulating plasma cells seen. See CBC data and comment.      Interval History: Sheryl [name] presents today for follow up.  She reports significant recurrence of symptoms, pain in the left side of neck radiating down back of her arm.  Interventions have not been successful, including analgesia (NSIADs, Tylenol, Tramadol) and Flexiril.  She has cut down on work to 3x per week, but is still experiencing worsened symptoms following work sessions, which are very manually intensive.  She had been mostly symptom free over the spring and summer, this recurrence has progressed gradually and intensified over several weeks.     Sheryl [name] presents today for assessment of neck and arm pain.  She describes severe pain, radiating from the left side of her neck down to her left should and down to mid-arm.  Pain does not reach fingers.  Discomfort is exacerbated by movement such as head turning or reaching out with arm.  Pain is particularly severe at times in the shoulder, although she is very clear that it radiates down from the neck rather than starting in the shoulder.  She feels "tight" on the left side and range of motion is limited in the arm.  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  Otherwise denies gait impairment or dysuria. Recently obtained an MRI of the neck for review today.  Medications: Current Outpatient Medications on File Prior to Visit  Medication Sig Dispense Refill  . acyclovir (ZOVIRAX) 800 MG tablet Take 1 tablet (800 mg total) by mouth 2 (two) times daily. 60 tablet 3  . aspirin 81 MG chewable tablet Chew 81 mg by mouth daily.    . chlorpheniramine-HYDROcodone (TUSSIONEX)  10-8 MG/5ML SUER Take 5 mLs by mouth every 12 (twelve) hours as needed for cough. 140 mL 0  . feeding supplement, ENSURE ENLIVE, (ENSURE ENLIVE) LIQD Take 237 mLs by mouth 2 (two) times daily between meals. 237 mL 12  . ibuprofen (ADVIL,MOTRIN) 200 MG tablet Take 200 mg by mouth every 6 (six) hours as needed for moderate pain.    . magic mouthwash SOLN Take 5 mLs by mouth 4 (four) times daily as needed for mouth pain. 240 mL 3  . omeprazole (PRILOSEC) 20 MG capsule Take 1 capsule (20 mg total) by mouth daily. 30 capsule 5  . ondansetron (ZOFRAN ODT) 8 MG disintegrating tablet Take 1 tablet (8 mg total) by mouth every 8 (eight) hours as needed for nausea or vomiting. 40 tablet 1  . polyethylene glycol (MIRALAX / GLYCOLAX) packet Take 17 g by mouth daily as needed (constipation). 14 each 1  . pomalidomide (POMALYST) 2 MG capsule Take 1 capsule (2 mg total) by mouth daily. Take with water on days 1-21. Repeat every 28 days. 21 capsule 0  . prochlorperazine (COMPAZINE) 10 MG tablet Take 1 tablet (10 mg total) by mouth every 6 (six) hours as needed for nausea or vomiting. 30 tablet 3  . traMADol (ULTRAM) 50 MG tablet Take 1 tablet (50 mg total) by mouth every 6 (six) hours as needed. 30 tablet 0  . tenofovir (VIREAD) 300 MG tablet Take 1 tablet (300 mg total) by  mouth daily. (Patient not taking: Reported on 12/23/2018) 30 tablet 1   Current Facility-Administered Medications on File Prior to Visit  Medication Dose Route Frequency Provider Last Rate Last Dose  . sodium chloride 0.9 % injection 10 mL  10 mL Intravenous PRN Truitt Merle, MD   10 mL at 12/11/15 1532  . sodium chloride 0.9 % injection 10 mL  10 mL Intravenous PRN Truitt Merle, MD   10 mL at 06/11/16 1505  . sodium chloride 0.9 % injection 10 mL  10 mL Intravenous PRN Truitt Merle, MD   10 mL at 08/04/17 1643  . sodium chloride flush (NS) 0.9 % injection 10 mL  10 mL Intravenous PRN Truitt Merle, MD   10 mL at 10/09/15 1717  . sodium chloride flush (NS)  0.9 % injection 10 mL  10 mL Intracatheter PRN Truitt Merle, MD   10 mL at 09/27/18 1228    Allergies: No Known Allergies Past Medical History:  Past Medical History:  Diagnosis Date  . Chills with fever    intermittently since d/c from hospital  . Dysuria-frequency syndrome    w/ pink urine  . GERD (gastroesophageal reflux disease)   . Hepatitis   . History of positive PPD    DX 2011--  CXR DONE NO EVIDENCE  . History of ureter stent   . Hydronephrosis, right   . Neuromuscular disorder ([facility])    legs numb intermittently  . 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 Pneumonia   . Right ureteral stone   . Urosepsis 8/14   ad[facility] to [facility]   Past Surgical History:  Past Surgical History:  Procedure Laterality Date  . CYSTOSCOPY W/ URETERAL STENT PLACEMENT Right 09/25/2012   Procedure: CYSTOSCOPY WITH RETROGRADE PYELOGRAM/URETERAL STENT PLACEMENT;  Surgeon: Alexis Frock, MD;  Location: [facility];  Service: Urology;  Laterality: Right;  . CYSTOSCOPY WITH RETROGRADE PYELOGRAM, URETEROSCOPY AND STENT PLACEMENT Right 10/15/2012   Procedure: CYSTOSCOPY WITH RETROGRADE PYELOGRAM, URETEROSCOPY AND REMOVAL STENT WITH  STENT PLACEMENT;  Surgeon: Alexis Frock, MD;  Location: [facility];  Service: Urology;  Laterality: Right;  . ESOPHAGOGASTRODUODENOSCOPY (EGD) WITH PROPOFOL N/A 11/16/2014   Procedure: ESOPHAGOGASTRODUODENOSCOPY (EGD) WITH PROPOFOL;  Surgeon: Milus Banister, MD;  Location: [facility];  Service: Endoscopy;  Laterality: N/A;  . HOLMIUM LASER APPLICATION Right [id number]/47/6546   Procedure: HOLMIUM LASER APPLICATION;  Surgeon: Alexis Frock, MD;  Location: [facility];  Service: Urology;  Laterality: Right;  . LIVER BIOPSY    . OTHER SURGICAL HISTORY Right    removal of ovarian cyst  . removal of uterine cyst     years ago  . RIGHT VATS W/ DRAINAGE PEURAL EFFUSION AND BX'S  10-30-2008   Social History:  Social History   Socioeconomic History  . Marital status:  Single    Spouse name: Not on file  . Number of children: 3  . Years of education: Not on file  . Highest education level: Not on file  Occupational History  . Not on file  Social Needs  . Financial resource strain: Not on file  . Food insecurity    Worry: Not on file    Inability: Not on file  . Transportation needs    Medical: Not on file    Non-medical: Not on file  Tobacco Use  . Smoking status: Never Smoker  . Smokeless tobacco: Never Used  Substance and Sexual Activity  . Alcohol use: No    Alcohol/week: 0.0 standard drinks  .  Drug use: No  . Sexual activity: Not Currently    Birth control/protection: Abstinence  Lifestyle  . Physical activity    Days per week: Not on file    Minutes per session: Not on file  . Stress: Not on file  Relationships  . Social Herbalist on phone: Not on file    Gets together: Not on file    Attends religious service: Not on file    Active member of club or organization: Not on file    Attends meetings of clubs or organizations: Not on file    Relationship status: Not on file  . Intimate partner violence    Fear of current or ex partner: Not on file    Emotionally abused: Not on file    Physically abused: Not on file    Forced sexual activity: Not on file  Other Topics Concern  . Not on file  Social History Narrative  . Not on file   Family History:  Family History  Problem Relation Age of Onset  . Stomach cancer Mother   . Lung disease Father   . Asthma Father     Review of Systems: Constitutional: Denies fevers, chills or abnormal weight loss Eyes: Denies blurriness of vision Ears, nose, mouth, throat, and face: Denies mucositis or sore throat Respiratory: Denies cough, dyspnea or wheezes Cardiovascular: Denies palpitation, chest discomfort or lower extremity swelling Gastrointestinal:  Denies nausea, constipation, diarrhea GU: Denies dysuria or incontinence Skin: Denies abnormal skin rashes Neurological:  Per HPI Musculoskeletal: Per HPI Behavioral/Psych: Denies anxiety, disturbance in thought content, and mood instability   Physical Exam: Vitals:   12/23/18 1209  BP: (!) 141/91  Pulse: 71  Resp: 18  Temp: 97.9 F (36.6 C)  SpO2: 100%   KPS: 80. General: Alert, cooperative, pleasant, in no acute distress Head: Craniotomy scar noted, dry and intact. EENT: No conjunctival injection or scleral icterus. Oral mucosa moist Lungs: Resp effort normal Cardiac: Regular rate and rhythm Abdomen: Soft, non-distended abdomen Skin: No rashes cyanosis or petechiae. Extremities: No clubbing or edema  Neurologic Exam: Mental Status: Awake, alert, attentive to examiner. Oriented to self and environment. Language is fluent with intact comprehension.  Cranial Nerves: Visual acuity is grossly normal. Visual fields are full. Extra-ocular movements intact. No ptosis. Face is symmetric, tongue midline. Motor: Tone and bulk are normal. Power is full in both arms and legs, with some pain limitation in left arm. Reflexes are symmetric, no pathologic reflexes present. Intact finger to nose bilaterally Sensory: Intact to light touch and temperature Gait: Normal and tandem gait is normal.   Labs: I have reviewed the data as listed    Component Value Date/Time   NA 141 12/23/2018 1053   NA 140 02/02/2017 1317   K 3.7 12/23/2018 1053   K 4.0 02/02/2017 1317   CL 106 12/23/2018 1053   CO2 26 12/23/2018 1053   CO2 25 02/02/2017 1317   GLUCOSE 93 12/23/2018 1053   GLUCOSE 121 02/02/2017 1317   BUN 29 (H) 12/23/2018 1053   BUN 13.8 02/02/2017 1317   CREATININE 0.79 12/23/2018 1053   CREATININE 0.72 03/03/2017 1533   CREATININE 0.8 02/02/2017 1317   CALCIUM 9.4 12/23/2018 1053   CALCIUM 9.3 02/02/2017 1317   PROT 7.2 12/23/2018 1053   PROT 6.9 02/02/2017 1317   ALBUMIN 3.6 12/23/2018 1053   ALBUMIN 3.5 02/02/2017 1317   AST 17 12/23/2018 1053   AST 47 (H) 02/02/2017 1317  ALT 6 12/23/2018 1053    ALT 23 02/02/2017 1317   ALKPHOS 91 12/23/2018 1053   ALKPHOS 98 02/02/2017 1317   BILITOT 0.3 12/23/2018 1053   BILITOT <0.22 02/02/2017 1317   GFRNONAA >60 12/23/2018 1053   GFRAA >60 12/23/2018 1053   Lab Results  Component Value Date   WBC 7.7 12/23/2018   NEUTROABS 5.1 12/23/2018   HGB 11.2 (L) 12/23/2018   HCT 35.4 (L) 12/23/2018   MCV 94.9 12/23/2018   PLT 233 12/23/2018    Imaging:  Dg Chest 2 View  Result Date: 12/02/2018 CLINICAL DATA:  48 year old female recently treated for pneumonia. History of leukemia. EXAM: CHEST - 2 VIEW COMPARISON:  Chest CTA and radiographs 11/22/2018. FINDINGS: PA and lateral views. Stable right chest dual lumen power port, no longer accessed. Lung volumes and mediastinal contours remain within normal limits. Visualized tracheal air column is within normal limits. Mildly increased markings at both lung bases appear stable without evidence of pneumonia or pleural effusion. No pneumothorax or pulmonary edema. No acute osseous abnormality identified. Negative visible bowel gas pattern. IMPRESSION: No evidence of pneumonia.  No acute cardiopulmonary abnormality. Electronically Signed   By: Genevie Ann M.D.   On: 12/02/2018 13:33    Assessment/Plan 1. Cervical radiculopathy  Sheryl [name] presents with recurrent clinical syndrome consistent with high cervical radiculopathy, likely C3-5 on the left side.    Again there are no discernable sensory or motor deficits and no myelopathy.  Current symptoms appear to be refractory to prior alleviating interventions.   We recommended she continue Flexiril TID PRN, initiate therapy with dexamethasone 72m daily x5 days.  Most importantly, we recommended continuing staying home from work indefinitely.  She may return to clinic as needed with additional or progressive symptoms, although we will schedule a follow up phone visit for next week to assess response to steroids.  Next intervention will be referral to Dr.  HMaryjean Kafor interventional pain assessment.  We appreciate the opportunity to participate in the care of Sheryl [name].   All questions were answered. The patient knows to call the clinic with any problems, questions or concerns. No barriers to learning were detected.  The total time spent in the encounter was 25 minutes and more than 50% was on counseling and review of test results   ZVentura Sellers MD Medical Director of Neuro-Oncology [facility] Centerat [place] [place]11/19/20 3:41 PM

## 2018-12-24 ENCOUNTER — Telehealth: Payer: Self-pay | Admitting: Hematology

## 2018-12-24 NOTE — Telephone Encounter (Signed)
Scheduled appt per 11/19 los.  Spoke with pt daughter and she is aware of the appt date and time.

## 2018-12-27 ENCOUNTER — Other Ambulatory Visit: Payer: Self-pay | Admitting: Internal Medicine

## 2018-12-27 ENCOUNTER — Telehealth: Payer: Self-pay | Admitting: Internal Medicine

## 2018-12-27 ENCOUNTER — Telehealth: Payer: Self-pay

## 2018-12-27 ENCOUNTER — Other Ambulatory Visit: Payer: Self-pay | Admitting: Hematology

## 2018-12-27 DIAGNOSIS — M5412 Radiculopathy, cervical region: Secondary | ICD-10-CM

## 2018-12-27 MED ORDER — OXYCODONE HCL 5 MG PO TABS: 5.0000 mg | ORAL_TABLET | Freq: Three times a day (TID) | ORAL | 0 refills | Status: DC | PRN

## 2018-12-27 MED FILL — oxyCODONE HCL 5 MG TABS: 5 | 10 days supply | Qty: 30 | Fill #0

## 2018-12-27 NOTE — Telephone Encounter (Signed)
Contacted patient to verify telephone visit for pre reg °

## 2018-12-27 NOTE — Telephone Encounter (Signed)
Spoke with patient's sister concerning her neck/shoulder pain.  She is taking steroids but is not helping.  I explained I have spoken with Dr. Mickeal Skinner and Dr. Burr Medico.  Per Dr. Mickeal Skinner he is going to order MRI.  She is now interested in referral to Dr. Maryjean Ka and I have let Dr. Mickeal Skinner know.  Also she is interested in stronger pain medication.  Per Dr. Burr Medico she is sending in Oxycodone for her to try.  This was interpreted by her sister and the patient verbalized an understanding.

## 2018-12-28 ENCOUNTER — Inpatient Hospital Stay (HOSPITAL_BASED_OUTPATIENT_CLINIC_OR_DEPARTMENT_OTHER): Payer: Medicaid Other | Admitting: Internal Medicine

## 2018-12-28 ENCOUNTER — Other Ambulatory Visit: Payer: Self-pay | Admitting: Radiation Therapy

## 2018-12-28 DIAGNOSIS — M5412 Radiculopathy, cervical region: Secondary | ICD-10-CM | POA: Diagnosis not present

## 2018-12-28 MED ORDER — DEXAMETHASONE 2 MG PO TABS: 4.0000 mg | ORAL_TABLET | Freq: Two times a day (BID) | ORAL | 0 refills | Status: AC

## 2018-12-28 NOTE — Progress Notes (Signed)
I connected with Sheryl [name] on 12/28/18 at  9:00 AM EST by telephone visit and verified that I am speaking with the correct person using two identifiers.  I discussed the limitations, risks, security and privacy concerns of performing an evaluation and management service by telemedicine and the availability of in-person appointments. I also discussed with the patient that there may be a patient responsible charge related to this service. The patient expressed understanding and agreed to proceed.  Other persons participating in the visit and their role in the encounter:  Daughter  Patient's location:  Home  Provider's location:  Office   Chief Complaint:  Neck and arm pain  History of Present Ilness: Patient describes continued refractory nature of left neck and arm discomfort, again radiating/lancinating in quality.  She does feel the steroids have helped somewhat (in addition to the tramadol) and would like to continue dosing it.  However, she feels it "wears off" after a few hours.  Observations: Language and cognition at baseline  Assessment and Plan: Change decadron to 2mg  BID for more even coverage.  Con't Tramadol and Flexiril QID PRN.  Referral placed for consultation with pain internationalist Dr. Maryjean Ka.  MRI c-spine repeat scheduled for next week   Follow Up Instructions: Patient will touch base with [place] for follow up after visit with Dr. Maryjean Ka and MRI of cervical spine.  We are happy to see her more urgently as well at any time given ongoing symptoms.  I discussed the assessment and treatment plan with the patient.  The patient was provided an opportunity to ask questions and all were answered.  The patient agreed with the plan and demonstrated understanding of the instructions.    The patient was advised to call back or seek an in-person evaluation if the symptoms worsen or if the condition fails to improve as anticipated.  I provided 5-10 minutes of non-face-to-face time during this  enocunter.  Ventura Sellers, MD   I provided 15 minutes of non face-to-face telephone visit time during this encounter, and > 50% was spent counseling as documented under my assessment & plan.

## 2018-12-29 ENCOUNTER — Other Ambulatory Visit: Payer: Self-pay

## 2018-12-29 ENCOUNTER — Telehealth: Payer: Self-pay | Admitting: Internal Medicine

## 2018-12-29 DIAGNOSIS — R0602 Shortness of breath: Secondary | ICD-10-CM

## 2018-12-29 NOTE — Telephone Encounter (Signed)
No los per 11/24. °

## 2019-01-01 ENCOUNTER — Emergency Department (HOSPITAL_COMMUNITY): Payer: Medicaid Other

## 2019-01-01 ENCOUNTER — Other Ambulatory Visit: Payer: Self-pay

## 2019-01-01 ENCOUNTER — Encounter (HOSPITAL_COMMUNITY): Payer: Self-pay | Admitting: Emergency Medicine

## 2019-01-01 ENCOUNTER — Emergency Department (HOSPITAL_COMMUNITY)
Admission: EM | Admit: 2019-01-01 | Discharge: 2019-01-02 | Disposition: A | Discharge status: Home / Self Care | Payer: Medicaid Other | Attending: Emergency Medicine | Admitting: Emergency Medicine

## 2019-01-01 DIAGNOSIS — M5412 Radiculopathy, cervical region: Secondary | ICD-10-CM | POA: Diagnosis not present

## 2019-01-01 DIAGNOSIS — Z7982 Long term (current) use of aspirin: Secondary | ICD-10-CM | POA: Diagnosis not present

## 2019-01-01 DIAGNOSIS — U071 COVID-19: Secondary | ICD-10-CM | POA: Insufficient documentation

## 2019-01-01 DIAGNOSIS — Z79899 Other long term (current) drug therapy: Secondary | ICD-10-CM | POA: Diagnosis not present

## 2019-01-01 DIAGNOSIS — C9011 Plasma cell leukemia in remission: Secondary | ICD-10-CM

## 2019-01-01 DIAGNOSIS — M25512 Pain in left shoulder: Secondary | ICD-10-CM | POA: Diagnosis present

## 2019-01-01 LAB — CBC WITH DIFFERENTIAL/PLATELET
Abs Immature Granulocytes: 0.42 10*3/uL — ABNORMAL HIGH (ref 0.00–0.07)
Basophils Absolute: 0 10*3/uL (ref 0.0–0.1)
Basophils Relative: 0 %
Eosinophils Absolute: 0.1 10*3/uL (ref 0.0–0.5)
Eosinophils Relative: 1 %
HCT: 40.2 % (ref 36.0–46.0)
Hemoglobin: 12.4 g/dL (ref 12.0–15.0)
Immature Granulocytes: 6 %
Lymphocytes Relative: 21 %
Lymphs Abs: 1.6 10*3/uL (ref 0.7–4.0)
MCH: 30.1 pg (ref 26.0–34.0)
MCHC: 30.8 g/dL (ref 30.0–36.0)
MCV: 97.6 fL (ref 80.0–100.0)
Monocytes Absolute: 1.1 10*3/uL — ABNORMAL HIGH (ref 0.1–1.0)
Monocytes Relative: 15 %
Neutro Abs: 4.3 10*3/uL (ref 1.7–7.7)
Neutrophils Relative %: 57 %
Platelets: 217 10*3/uL (ref 150–400)
RBC: 4.12 MIL/uL (ref 3.87–5.11)
RDW: 13.8 % (ref 11.5–15.5)
WBC: 7.5 10*3/uL (ref 4.0–10.5)
nRBC: 0 % (ref 0.0–0.2)

## 2019-01-01 LAB — COMPREHENSIVE METABOLIC PANEL
ALT: 9 U/L (ref 0–44)
AST: 30 U/L (ref 15–41)
Albumin: 3.4 g/dL — ABNORMAL LOW (ref 3.5–5.0)
Alkaline Phosphatase: 87 U/L (ref 38–126)
Anion gap: 10 (ref 5–15)
BUN: 21 mg/dL — ABNORMAL HIGH (ref 6–20)
CO2: 27 mmol/L (ref 22–32)
Calcium: 8.8 mg/dL — ABNORMAL LOW (ref 8.9–10.3)
Chloride: 102 mmol/L (ref 98–111)
Creatinine, Ser: 0.68 mg/dL (ref 0.44–1.00)
GFR calc Af Amer: >60 mL/min (ref >60)
GFR calc non Af Amer: >60 mL/min (ref >60)
Glucose, Bld: 95 mg/dL (ref 70–99)
Potassium: 4.2 mmol/L (ref 3.5–5.1)
Sodium: 139 mmol/L (ref 135–145)
Total Bilirubin: 0.9 mg/dL (ref 0.3–1.2)
Total Protein: 6.9 g/dL (ref 6.5–8.1)

## 2019-01-01 LAB — URINALYSIS, ROUTINE W REFLEX MICROSCOPIC
Bilirubin Urine: NEGATIVE
Glucose, UA: NEGATIVE mg/dL
Hgb urine dipstick: NEGATIVE
Ketones, ur: NEGATIVE mg/dL
Nitrite: NEGATIVE
Protein, ur: NEGATIVE mg/dL
Specific Gravity, Urine: 1.012 (ref 1.005–1.030)
WBC, UA: >50 WBC/hpf — ABNORMAL HIGH (ref 0–5)
pH: 6 (ref 5.0–8.0)

## 2019-01-01 MED ORDER — HYDROMORPHONE HCL 1 MG/ML IJ SOLN
0.5000 mg | Freq: Once | INTRAMUSCULAR | Status: AC
  Administered 2019-01-01: 0.5 mg via INTRAMUSCULAR
  Filled 2019-01-01: qty 1

## 2019-01-01 MED ORDER — METOCLOPRAMIDE HCL 5 MG/ML IJ SOLN
10.0000 mg | Freq: Once | INTRAMUSCULAR | Status: AC
  Administered 2019-01-01: 10 mg via INTRAVENOUS
  Filled 2019-01-01: qty 2

## 2019-01-01 MED ORDER — HYDROMORPHONE HCL 1 MG/ML IJ SOLN: 1.0000 mg | Freq: Once | INTRAMUSCULAR | Status: DC

## 2019-01-01 MED ORDER — ACETAMINOPHEN 500 MG PO TABS
1000.0000 mg | ORAL_TABLET | Freq: Once | ORAL | Status: AC
  Administered 2019-01-01: 1000 mg via ORAL
  Filled 2019-01-01: qty 2

## 2019-01-01 MED ORDER — HEPARIN SOD (PORK) LOCK FLUSH 100 UNIT/ML IV SOLN
500.0000 [IU] | Freq: Once | INTRAVENOUS | Status: AC
  Administered 2019-01-02: 01:00:00 500 [IU]
  Filled 2019-01-01: qty 5

## 2019-01-01 MED ORDER — HYDROMORPHONE HCL 1 MG/ML IJ SOLN
0.5000 mg | Freq: Once | INTRAMUSCULAR | Status: AC
  Administered 2019-01-01: 0.5 mg via INTRAVENOUS
  Filled 2019-01-01: qty 1

## 2019-01-01 MED ORDER — HYDROMORPHONE HCL 1 MG/ML IJ SOLN
0.5000 mg | Freq: Once | INTRAMUSCULAR | Status: AC
  Administered 2019-01-01: 23:00:00 0.5 mg via INTRAVENOUS
  Filled 2019-01-01: qty 1

## 2019-01-01 MED ORDER — KETOROLAC TROMETHAMINE 30 MG/ML IJ SOLN
30.0000 mg | Freq: Once | INTRAMUSCULAR | Status: AC
  Administered 2019-01-01: 22:00:00 30 mg via INTRAVENOUS
  Filled 2019-01-01: qty 1

## 2019-01-01 MED ORDER — DIPHENHYDRAMINE HCL 50 MG/ML IJ SOLN
12.5000 mg | Freq: Once | INTRAMUSCULAR | Status: AC
  Administered 2019-01-01: 12.5 mg via INTRAVENOUS
  Filled 2019-01-01: qty 1

## 2019-01-01 NOTE — ED Provider Notes (Signed)
Patient signed out at end of shift by Dr. Yelverton. Here with cervical radicular pain in UE - neck to shoulder to elbow. She has been on steroids for 2 weeks without relief. Had taken Oxycodone with decreasing effect. She had outpatient work up (MRI) scheduled that has been delayed secondary to insurance coverage. New appointment with specialist, per daughter, but not available until December 8th. Here she has received Dilaudid, Toradol and seems to be doing better.  UA pending, COVID send out pending for eval of low fever  11:00 - patient c/o nausea and headache. Reglan and benadryl ordered.   11:15 - her pain is now returned and significantly worse. Dilaudid ordered (3rd dose).   11:35 - Pain is ongoing, now having less relief with IV Dilaudid. Patient reports weakness in the left arm but unsure if this is pain related. Exam difficult as patient provides poor effort with strength testing of both UE's. Will consider admitting for pain management and MRI in hospital tomorrow to expedite care of worsening condition.   Patient has a history of multiple myeloma but has not been receiving chemo recently due to other complications, including PNA, weakness.   12:10 - pain is improved. Patient offered admission for pain control vs discharge home and she wants to go home. Will arrange for MRI to be done on Monday am in hospital to facilitate treatment. Discussed pain management at home and return precautions.    Upstill, Shari, PA-C 01/02/19 0013    Yelverton, David, MD 01/15/19 1247  

## 2019-01-01 NOTE — ED Provider Notes (Signed)
[facility] DEPT Provider Note   CSN: [id number] Arrival date & time: 01/01/19  1527     History   Chief Complaint Chief Complaint  Patient presents with  . Shoulder Pain    HPI Sheryl [name] is a 48 y.o. female.     HPI Patient presents with roughly 1 month of pain radiating from her neck to her left shoulder. No history of trauma. Patient is taking Decadron and oxycodone for the pain with no improvement. Pain worsened over the last 3 days. She denies any focal weakness or numbness. No shortness of breath or cough. No abdominal pain, nausea or vomiting. No known Covid exposure. Past Medical History:  Diagnosis Date  . Chills with fever    intermittently since d/c from hospital  . Dysuria-frequency syndrome    w/ pink urine  . GERD (gastroesophageal reflux disease)   . Hepatitis   . History of positive PPD    DX 2011--  CXR DONE NO EVIDENCE  . History of ureter stent   . Hydronephrosis, right   . Neuromuscular disorder (HCC)    legs numb intermittently  . 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 Pneumonia   . Right ureteral stone   . Urosepsis 8/14   admitted to [facility]    Patient Active Problem List   Diagnosis Date Noted  . Cervical radiculopathy 03/15/2018  . Pneumonia 08/27/2017  . Hypokalemia 03/03/2017  . Cough   . Hyperbilirubinemia   . SIRS (systemic inflammatory response syndrome) ([place]) 02/16/2017  . Thrush 02/16/2017  . Chills with fever   . Right lower lobe pneumonia 02/09/2017  . Transaminitis 02/09/2017  . HCAP (healthcare-associated pneumonia) 05/11/2016  . Influenza B 05/11/2016  . Vomiting 05/11/2016  . Leukopenia 05/11/2016  . 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 Hypersensitivity reaction 04/18/2016  . Hemorrhoid 12/03/2015  . Headache 12/03/2015  . Dehydration 11/14/2015  . Fever 11/14/2015  . 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 Port catheter in place 05/30/2015  . Acute pyelonephritis   .  Immunosuppressed status ([place])   . UTI (urinary tract infection) 05/21/2015  . Sepsis ([place]) 05/20/2015  . Hepatitis 10/24/2014  . 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 GIB (gastrointestinal bleeding) 10/07/2014  . Healthcare-associated pneumonia 10/07/2014  . Constipation 09/17/2014  . Leukocytosis 09/14/2014  . Nausea & vomiting 09/14/2014  . Abdominal pain 09/14/2014  . Abnormal LFTs 09/14/2014  . Bilateral leg numbness 09/14/2014  . Normocytic anemia 09/14/2014  . GERD (gastroesophageal reflux disease)   . Chronic hepatitis B without delta agent without hepatic coma (HCC)   . Gastroesophageal reflux disease without esophagitis     Past Surgical History:  Procedure Laterality Date  . CYSTOSCOPY W/ URETERAL STENT PLACEMENT Right 09/25/2012   Procedure: CYSTOSCOPY WITH RETROGRADE PYELOGRAM/URETERAL STENT PLACEMENT;  Surgeon: Alexis Frock, MD;  Location: [facility];  Service: Urology;  Laterality: Right;  . CYSTOSCOPY WITH RETROGRADE PYELOGRAM, URETEROSCOPY AND STENT PLACEMENT Right 10/15/2012   Procedure: CYSTOSCOPY WITH RETROGRADE PYELOGRAM, URETEROSCOPY AND REMOVAL STENT WITH  STENT PLACEMENT;  Surgeon: Alexis Frock, MD;  Location[facility];  Service: Urology;  Laterality: Right;  . ESOPHAGOGASTRODUODENOSCOPY (EGD) WITH PROPOFOL N/A 11/16/2014   Procedure: ESOPHAGOGASTRODUODENOSCOPY (EGD) WITH PROPOFOL;  Surgeon: Milus Banister, MD;  Location: [facility];  Service: Endoscopy;  Laterality: N/A;  . HOLMIUM LASER APPLICATION Right [id number]   Procedure: HOLMIUM LASER APPLICATION;  Surgeon: Alexis Frock, MD;  Location[facility];  Service: Urology;  Laterality: Right;  . LIVER BIOPSY    . OTHER SURGICAL HISTORY Right    removal of ovarian cyst  . removal of uterine cyst     years ago  . RIGHT VATS W/ DRAINAGE PEURAL EFFUSION AND BX'S  10-30-2008     OB History   No obstetric history on file.      Home Medications    Prior to Admission  medications   Medication Sig Start Date End Date Taking? Authorizing Provider  acyclovir (ZOVIRAX) 800 MG tablet Take 1 tablet (800 mg total) by mouth 2 (two) times daily. 05/05/18   Truitt Merle, MD  aspirin 81 MG chewable tablet Chew 81 mg by mouth daily.    Truitt Merle, MD  chlorpheniramine-HYDROcodone (TUSSIONEX) 10-8 MG/5ML SUER Take 5 mLs by mouth every 12 (twelve) hours as needed for cough. 12/06/18   Alla Feeling, NP  dexamethasone (DECADRON) 2 MG tablet Take 2 tablets (4 mg total) by mouth 2 (two) times daily. 12/28/18   Ventura Sellers, MD  feeding supplement, ENSURE ENLIVE, (ENSURE ENLIVE) LIQD Take 237 mLs by mouth 2 (two) times daily between meals. 02/22/17   Elodia Florence., MD  ibuprofen (ADVIL,MOTRIN) 200 MG tablet Take 200 mg by mouth every 6 (six) hours as needed for moderate pain.    [provider]  magic mouthwash SOLN Take 5 mLs by mouth 4 (four) times daily as needed for mouth pain. 06/23/17   Tanner, Lyndon Code., PA-C  omeprazole (PRILOSEC) 20 MG capsule Take 1 capsule (20 mg total) by mouth daily. 07/06/18   Alla Feeling, NP  ondansetron (ZOFRAN ODT) 8 MG disintegrating tablet Take 1 tablet (8 mg total) by mouth every 8 (eight) hours as needed for nausea or vomiting. 10/22/17   Alla Feeling, NP  oxyCODONE (OXY IR/ROXICODONE) 5 MG immediate release tablet Take 1 tablet (5 mg total) by mouth every 8 (eight) hours as needed for severe pain. 12/27/18   Truitt Merle, MD  polyethylene glycol [facility] / Floria Raveling) packet Take 17 g by mouth daily as needed (constipation). 08/31/17   Alla Feeling, NP  pomalidomide (POMALYST) 2 MG capsule Take 1 capsule (2 mg total) by mouth daily. Take with water on days 1-21. Repeat every 28 days. 11/08/18   Truitt Merle, MD  prochlorperazine (COMPAZINE) 10 MG tablet Take 1 tablet (10 mg total) by mouth every 6 (six) hours as needed for nausea or vomiting. 08/17/17   Alla Feeling, NP  tenofovir (VIREAD) 300 MG tablet Take 1 tablet (300 mg total)  by mouth daily. Patient not taking: Reported on 12/23/2018 10/27/18   Kuppelweiser, Cassie L, RPH-CPP  traMADol (ULTRAM) 50 MG tablet Take 1 tablet (50 mg total) by mouth every 6 (six) hours as needed. 12/17/18   Truitt Merle, MD    Family History Family History  Problem Relation Age of Onset  . Stomach cancer Mother   . Lung disease Father   . Asthma Father     Social History Social History   Tobacco Use  . Smoking status: Never Smoker  . Smokeless tobacco: Never Used  Substance Use Topics  . Alcohol use: No    Alcohol/week: 0.0 standard drinks  . Drug use: No     Allergies   Patient has no known allergies.   Review of Systems Review of Systems  Constitutional: Negative for chills and fever.  Eyes: Negative for visual disturbance.  Respiratory: Negative for cough and shortness of breath.  Cardiovascular: Negative for chest pain.  Gastrointestinal: Negative for abdominal pain, diarrhea, nausea and vomiting.  Genitourinary: Negative for dysuria, flank pain and frequency.  Musculoskeletal: Positive for arthralgias, myalgias and neck pain. Negative for back pain and neck stiffness.  Skin: Negative for rash and wound.  Neurological: Negative for dizziness, weakness, light-headedness, numbness and headaches.  All other systems reviewed and are negative.    Physical Exam Updated Vital Signs BP (!) 123/99   Pulse 100   Temp (!) 100.6 F (38.1 C) (Oral)   Resp 14   Ht 5\' 3"  (1.6 m)   Wt 65 kg   LMP 11/24/2014   SpO2 100%   BMI 25.38 kg/m   Physical Exam Vitals signs and nursing note reviewed.  Constitutional:      Appearance: She is well-developed.     Comments: Patient appears uncomfortable.  HENT:     Head: Normocephalic and atraumatic.     Nose: Nose normal.     Mouth/Throat:     Mouth: Mucous membranes are moist.  Eyes:     Extraocular Movements: Extraocular movements intact.     Pupils: Pupils are equal, round, and reactive to light.  Neck:      Musculoskeletal: Normal range of motion and neck supple. No neck rigidity or muscular tenderness.     Comments: No midline cervical tenderness to palpation. No cervical lymphadenopathy. No meningismus. Patient does have tenderness to palpation of the left trapezius with some notable spasm present. Cardiovascular:     Rate and Rhythm: Normal rate and regular rhythm.     Heart sounds: No murmur. No friction rub. No gallop.   Pulmonary:     Effort: Pulmonary effort is normal. No respiratory distress.     Breath sounds: Normal breath sounds. No stridor. No wheezing, rhonchi or rales.  Chest:     Chest wall: No tenderness.  Abdominal:     General: Bowel sounds are normal. There is no distension.     Palpations: Abdomen is soft. There is no mass.     Tenderness: There is no abdominal tenderness. There is no right CVA tenderness, left CVA tenderness, guarding or rebound.     Hernia: No hernia is present.  Musculoskeletal: Normal range of motion.        General: Tenderness present. No swelling, deformity or signs of injury.     Right lower leg: No edema.     Left lower leg: No edema.     Comments: Patient with tenderness over the lateral surface of the left shoulder. Pain is worsened with abduction of the shoulder. No obvious swelling, deformity, erythema or warmth. Distal pulses are 2+. No midline thoracic or lumbar tenderness. No CVA tenderness. No lower extremity swelling, asymmetry or tenderness. Distal pulses intact.  Lymphadenopathy:     Cervical: No cervical adenopathy.  Skin:    General: Skin is warm and dry.     Capillary Refill: Capillary refill takes less than 2 seconds.     Findings: No erythema or rash.  Neurological:     General: No focal deficit present.     Mental Status: She is alert and oriented to person, place, and time.     Comments: 5/5 grip strength bilaterally. Sensation intact. Patient ambulates without difficulty.      ED Treatments / Results  Labs (all labs  ordered are listed, but only abnormal results are displayed) Labs Reviewed  CBC WITH DIFFERENTIAL/PLATELET  COMPREHENSIVE METABOLIC PANEL  URINALYSIS, ROUTINE W REFLEX MICROSCOPIC  EKG None  Radiology No results found.  Procedures Procedures (including critical care time)  Medications Ordered in ED Medications  HYDROmorphone (DILAUDID) injection 0.5 mg (has no administration in time range)     Initial Impression / Assessment and Plan / ED Course  I have reviewed the triage vital signs and the nursing notes.  Pertinent labs & imaging results that were available during my care of the patient were reviewed by me and considered in my medical decision making (see chart for details).        Patient had MRI of the cervical spine in February with minimal disc disease. Signout oncoming emergency physician pending completion of work-up.  Low suspicion for meningitis or septic joint.  Unsure of significance of low-grade temperature elevation.  UA pending as well as Covid testing. Final Clinical Impressions(s) / ED Diagnoses   Final diagnoses:  None    ED Discharge Orders    None       Julianne Rice, MD 01/01/19 2205

## 2019-01-01 NOTE — ED Triage Notes (Signed)
The patient has had left shoulder pain for 1 month. The pain has worsened the past 3 days. Since steroid were not helping, her MD told her to go to the hospital so she would not have to wait until monday for care.

## 2019-01-01 NOTE — ED Notes (Signed)
Pt request for labs to be drawn from port. RN advised. [employer]

## 2019-01-02 LAB — SARS CORONAVIRUS 2 (TAT 6-24 HRS): SARS Coronavirus 2: POSITIVE — AB

## 2019-01-02 MED ORDER — ONDANSETRON 8 MG PO TBDP: 8.0000 mg | ORAL_TABLET | Freq: Three times a day (TID) | ORAL | 1 refills | Status: DC | PRN

## 2019-01-02 NOTE — Discharge Instructions (Addendum)
Take Zofran 15 minutes prior to taking your oxycodone for pain to see if this prevents nausea.   You are scheduled for a MRI of your neck for Monday morning at [facility]. Check in at radiology department at 8:00 am for study.  Please return to the ED if you have worsening pain, progressive weakness or numbness of your left arm or for new concern.

## 2019-01-03 ENCOUNTER — Telehealth: Payer: Self-pay | Admitting: *Deleted

## 2019-01-03 ENCOUNTER — Telehealth: Payer: Self-pay

## 2019-01-03 ENCOUNTER — Other Ambulatory Visit: Payer: Medicaid Other

## 2019-01-03 ENCOUNTER — Ambulatory Visit: Payer: Medicaid Other

## 2019-01-03 NOTE — Telephone Encounter (Signed)
ATC the pt and her line rings busy Looks like [place] has already spoke with the pt's daughter, [name], and cancelled f/u appt

## 2019-01-03 NOTE — Telephone Encounter (Signed)
Call made to daughter [name], aware we will need to cancel appt due to scan not being approved. I made her aware. We would f/u on this later in the week. Voiced understanding.   Will route message to myself to f/u.

## 2019-01-03 NOTE — Telephone Encounter (Signed)
-----   Message from Garner Nash, DO sent at 01/03/2019  1:22 PM EST ----- Regarding: RE: reschedule appt Yeah, so appt should be rescheduled Until ct has been complete   Thanks  Leory Plowman ----- Message ----- From: Rosana Berger, CMA Sent: 01/03/2019  12:35 PM EST To: Garner Nash, DO Subject: RE: reschedule appt                            It looks like her ct was scheduled for 12/17/18 but it was cancelled bc of her not having any insurance.  ----- Message ----- From: Garner Nash, DO Sent: 01/02/2019   6:48 PM EST To: Vivia Ewing, LPN, [facility] Triage Pool Subject: reschedule appt                                Hello  This lady's ct chest has not been completed so I think we should reschedule her appt until its done  Thanks  Leory Plowman

## 2019-01-04 ENCOUNTER — Telehealth: Payer: Self-pay

## 2019-01-04 ENCOUNTER — Ambulatory Visit (HOSPITAL_COMMUNITY): Payer: Medicaid Other

## 2019-01-04 LAB — URINE CULTURE: Culture: >=100000 — AB

## 2019-01-04 NOTE — Telephone Encounter (Signed)
Called patient's daughter [name] to Discuss with patient about Covid symptoms and the use of bamlanivimab, a monoclonal antibody infusion for those with mild to moderate Covid symptoms and at a high risk of hospitalization.     Pt is qualified for this infusion at the [facility] infusion center due to co-morbid conditions and/or a member of an at-risk group.     Patient denies any symptoms.

## 2019-01-04 NOTE — Telephone Encounter (Signed)
PCCM: Thanks for the update Garner Nash, DO [facility] 01/04/2019 3:21 PM

## 2019-01-04 NOTE — Telephone Encounter (Signed)
01/04/2019 1244  Sheryl [name],  Thank you for contacting the patient.  Is the patient getting scheduled for the infusion?  Is this something that you are team is coordinating?  Cherina,   Can we work to get the patient scheduled for a virtual visit/telephonic visit sometime this week.  Daughter will translate for the patient.  Wyn Quaker, FNP

## 2019-01-04 NOTE — Telephone Encounter (Signed)
Oral prep for upcoming CT scan on 12/4 left at reception desk with instructions on prep and premedications due to allergy to contrast enclosed.

## 2019-01-04 NOTE — Telephone Encounter (Signed)
Disregard previous message charted on wrong patient.

## 2019-01-04 NOTE — Telephone Encounter (Signed)
01/04/2019 1513  Just spoke with pts daughter. Pt is currently afebrile and asymptomatic. Currently has no symptoms. Pt is currently NOT scheduled for bamlanivimab, a monoclonal antibody infusion as she is asymptomatic. Pt has been scheduled for 01/06/2019 at 9 AM with Wyn Quaker, FNP to further monitor patient's symptoms.  Patient's daughter agrees to contact our office if the patient starts to become symptomatic so that way we can work with the [facility] infusion team to get the patient scheduled for an effusion.  I have sent a staff message to update oncology as well as Dr. Valeta Harms.  Wyn Quaker FNP

## 2019-01-05 ENCOUNTER — Ambulatory Visit: Payer: Medicaid Other | Admitting: Pulmonary Disease

## 2019-01-05 DIAGNOSIS — U071 COVID-19: Secondary | ICD-10-CM | POA: Insufficient documentation

## 2019-01-05 NOTE — Progress Notes (Signed)
Virtual Visit via Telephone Note  I connected with Sheryl [name] on 01/06/19 at  9:00 AM EST by telephone and verified that I am speaking with the correct person using two identifiers.  Location: Patient: Home Provider: Office [facility] - [address], [place], [place], [place] [address]   I discussed the limitations, risks, security and privacy concerns of performing an evaluation and management service by telephone and the availability of in person appointments. I also discussed with the patient that there may be a patient responsible charge related to this service. The patient expressed understanding and agreed to proceed.  Patient consented to consult via telephone: Yes People present and their role in pt care: Pt   History of Present Illness:  48 year old female never smoker initially consulted with our practice on 12/15/2018 with Dr. Valeta Harms.  This was for evaluation of shortness of breath.  The shortness of breath developed after her weekly chemotherapy treatments.  She is followed at [facility] oncology Dr. Evelena Leyden for plasma cell leukemia and also followed at [facility].  Past medical history: Plasma cell leukemia, chronic hepatitis B, status post bone marrow transplant, status post bone marrow biopsy, GERD, UTI, fever, headache, COVID-19 positive (November/2020) Smoking history: Never smoker Maintenance: None Patient of Dr. Valeta Harms  Chief complaint: Televisit, recent COVID-7 diagnosis  47 year old female never smoker who requires an interpreter (Montagnard Rhade).  Patient's daughter is the patient's interpreter today.  The patient agrees to this.  Patient was last evaluated in our office on 12/15/2018 by Dr. Valeta Harms.  It was felt at that time that she may have an atypical pneumonia or potentially ILD directly related to her chemotherapy drugs.  High-resolution CT chest was ordered.  As well as a bronchoscopy was discussed.  Unfortunately none of this was completed due to the  patient recently being diagnosed with SARS-CoV-2. Patient was recently diagnosed with SARS-CoV-2 01/01/2019 after an emergency room visit.  Patient was contacted by our office on 01/04/2019 to evaluate symptoms.  Patient at that point in time on 01/04/2019 remained afebrile as well as remained asymptomatic.  Due to patient remaining to be asymptomatic she did not qualify for the monoclonal antibody infusion yet.  We scheduled a televisit today to follow-up and monitor patient symptoms.   Patient remains asymptomatic today.  She reports no fever, body aches, chills, sinus symptoms, nasal drainage, cough, wheezing, loss of smell or taste, GI symptoms.  Patient denies any of the symptoms.  No one in the patient's household is having the symptoms either.  They also all remain afebrile.  The only persistent problem that the patient is having is her ongoing shoulder and neck pain.  Prior to being diagnosed with COVID-19 there was planned outpatient MRI imaging that was prompted by the 01/01/2019 emergency room visit.  Observations/Objective:  01/01/2019-SARS-CoV-2-positive  11/22/2018-CTA chest-no PE, groundglass type opacity in each lung base, concern for atypical organism pneumonia in the lung bases, scattered areas of atelectatic change, loculated ascites anterior and lateral to the spleen  01/01/2019-chest x-ray-no radiographic evidence of acute cardiopulmonary disease  Social History   Tobacco Use  Smoking Status Never Smoker  Smokeless Tobacco Never Used   Immunization History  Administered Date(s) Administered  . 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   I discussed the assessment and treatment plan with the patient. The patient was provided an opportunity to ask questions and all were answered. The patient agreed with the plan and demonstrated an understanding of the instructions.   The patient was advised to call back or seek an in-person evaluation if the symptoms worsen or if the condition fails to improve as anticipated.  I provided 22 minutes of non-face-to-face time during this encounter.   Sheryl Rinne, NP

## 2019-01-06 ENCOUNTER — Other Ambulatory Visit: Payer: Self-pay

## 2019-01-06 ENCOUNTER — Encounter: Payer: Self-pay | Admitting: Pulmonary Disease

## 2019-01-06 ENCOUNTER — Telehealth: Payer: Self-pay

## 2019-01-06 ENCOUNTER — Ambulatory Visit (INDEPENDENT_AMBULATORY_CARE_PROVIDER_SITE_OTHER): Payer: Medicaid Other | Admitting: Pulmonary Disease

## 2019-01-06 DIAGNOSIS — M5412 Radiculopathy, cervical region: Secondary | ICD-10-CM | POA: Diagnosis not present

## 2019-01-06 DIAGNOSIS — U071 COVID-19: Secondary | ICD-10-CM

## 2019-01-06 DIAGNOSIS — C9012 Plasma cell leukemia in relapse: Secondary | ICD-10-CM | POA: Diagnosis not present

## 2019-01-06 NOTE — Progress Notes (Signed)
Pharmacy - Antibiotic Stewardship Brief Note  Patient presented to ED 11/28 for neck pain/shoulder pain with h/o cervical radiculopathy.  She was to have MRI but appears cancelled (? D/t COVID-19 positive status).  Ucx collected during 11/28 ED admission which grew MSSA.  She had no symptoms of UTI.  No blood cx drawn at that time.  She had a f/u 12/1 d/t being + COVID and she was noted to be afebrile.  She was evaluated again today via virtual visit by pulmonology and did not have any complaints and no fevers.  This Urine cx appears c/w asymptomatic bacteriuria.    D/w EDP and rec was to have patient called to ensure no UTI symptoms, fever, chills, or neurological issues.  If has one of these, she should come back to ED and if no symptoms then continue to monitor at home and seek medical attention if developed fever, chills, etc.  Info sent to patient placement as per standard procedure.  Doreene Eland, PharmD, BCPS.   Work Cell: [phone] 01/06/2019 10:26 AM

## 2019-01-06 NOTE — Assessment & Plan Note (Signed)
Interventions for patient's continued cervical /shoulder pain are limited due to the patient's current COVID-19 infection  Plan: Instructed patient to follow-up with oncology as well as with primary care We will route my note to primary care and oncology today If pain and symptoms worsen please present to an emergency room for further evaluation as well as pain management

## 2019-01-06 NOTE — Assessment & Plan Note (Signed)
Patient continues to be asymptomatic Due to patient being asymptomatic she does not qualify for monoclonal antibody infusion outpatient  Plan: We will follow-up with patient in 1 week for a telephonic visit Counseled patient and patient's daughter to report any new or worsening symptoms Explained to them that if patient does develop symptoms we can work to coordinate a monoclonal antibody infusion

## 2019-01-06 NOTE — Assessment & Plan Note (Signed)
Plan: Continue to follow-up with oncology 

## 2019-01-06 NOTE — Patient Instructions (Signed)
You were seen today by [name], NP  for:   1. COVID-19 virus infection  Continue to monitor symptoms Continue to monitor your temperature multiple times a day If any new symptoms develop such as fever or worsening respiratory symptoms or loss of sense of smell or taste or body aches or chills please contact our office immediately  2. 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   Please do your part to reduce the spread of COVID-19:      Reduce your risk of any infection  and COVID19 by using the similar precautions used for avoiding the common cold or flu:  Marland Kitchen Wash your hands often with soap and warm water for at least 20 seconds.  If soap and water are not readily available, use an alcohol-based hand sanitizer with at least 60% alcohol.  . If coughing or sneezing, cover your mouth and nose by coughing or sneezing into the elbow areas of your shirt or coat, into a tissue or into your sleeve (not your hands). Langley Gauss A MASK when in public  . Avoid shaking hands with others and consider head nods or verbal greetings only. . Avoid touching your eyes, nose, or mouth with unwashed hands.  . Avoid close contact with people who are sick. . Avoid places or events with large numbers of people in one location, like concerts or sporting events. . If you have some symptoms but not all symptoms, continue to monitor at home and seek medical attention if your symptoms worsen. . If you are having a medical emergency, call 911.   [place] / e-Visit: [email or web address]         [facility]: [facility]: [id number]                   [facility]: [id number]     It is flu season:   >>> Best ways to protect herself from the flu: Receive the yearly flu vaccine, practice good hand hygiene washing with soap and also using hand sanitizer when available, eat a nutritious meals, get adequate rest, hydrate appropriately   Please contact the office if your symptoms worsen or you have concerns that you are not improving.   Thank you for choosing [facility] for your healthcare, and for allowing [place] to partner with you on your healthcare journey. I am thankful to be able to provide care to you today.   Wyn Quaker FNP-C  This information is directly available on the CDC website: RunningShows.co.za.html    Source:CDC Reference to specific commercial products, manufacturers, companies, or trademarks does not constitute its endorsement or recommendation by the [place], [place], or Centers for [employer] and Prevention.    COVID-19 COVID-19 is a respiratory infection that is caused by a virus called severe acute respiratory syndrome coronavirus 2 (SARS-CoV-2). The disease is also known as coronavirus disease or novel coronavirus. In some people, the virus may not cause any symptoms. In others, it may cause a serious infection. The infection can get worse quickly and can lead to complications, such as:  Pneumonia,  or infection of the lungs.  Acute respiratory distress syndrome or ARDS. This is fluid build-up in the lungs.  Acute respiratory failure. This is a condition in which there is not enough oxygen passing from the lungs to the body.  Sepsis or septic shock. This is a serious bodily reaction to an infection.  Blood clotting problems.  Secondary infections due to bacteria or fungus. The virus that causes COVID-19 is contagious. This means that it can  spread from person to person through droplets from coughs and sneezes (respiratory secretions). What are the causes? This illness is caused by a virus. You may catch the virus by:  Breathing in droplets from an infected person's cough or sneeze.  Touching something, like a table or a doorknob, that was exposed to the virus (contaminated) and then touching your mouth, nose, or eyes. What increases the risk? Risk for infection You are more likely to be infected with this virus if you:  Live in or travel to an area with a COVID-19 outbreak.  Come in contact with a sick person who recently traveled to an area with a COVID-19 outbreak.  Provide care for or live with a person who is infected with COVID-19. Risk for serious illness You are more likely to become seriously ill from the virus if you:  Are 98 years of age or older.  Have a long-term disease that lowers your body's ability to fight infection (immunocompromised).  Live in a nursing home or long-term care facility.  Have a long-term (chronic) disease such as: ? Chronic lung disease, including chronic obstructive pulmonary disease or asthma ? Heart disease. ? Diabetes. ? Chronic kidney disease. ? Liver disease.  Are obese. What are the signs or symptoms? Symptoms of this condition can range from mild to severe. Symptoms may appear any time from 2 to 14 days after being exposed to the virus. They include:  A fever.  A cough.  Difficulty breathing.  Chills.  Muscle pains.  A sore throat.  Loss of taste or smell. Some people may also have stomach problems, such as nausea, vomiting, or diarrhea. Other people may not have any symptoms of COVID-19. How is this diagnosed? This condition may be diagnosed based on:  Your signs and symptoms, especially if: ? You live in an area with a COVID-19 outbreak. ? You recently traveled to or from an area where the virus is common. ? You provide care for or live with a person  who was diagnosed with COVID-19.  A physical exam.  Lab tests, which may include: ? A nasal swab to take a sample of fluid from your nose. ? A throat swab to take a sample of fluid from your throat. ? A sample of mucus from your lungs (sputum). ? Blood tests.  Imaging tests, which may include, X-rays, CT scan, or ultrasound. How is this treated? At present, there is no medicine to treat COVID-19. Medicines that treat other diseases are being used on a trial basis to see if they are effective against COVID-19. Your health care provider will talk with you about ways to treat your symptoms. For most people, the infection is mild and can be managed at home with rest, fluids, and over-the-counter medicines. Treatment for a serious infection usually takes places in a hospital intensive care unit (ICU). It may include one or more of the following treatments. These treatments are given until your symptoms improve.  Receiving fluids and medicines through an IV.  Supplemental oxygen. Extra oxygen  is given through a tube in the nose, a face mask, or a hood.  Positioning you to lie on your stomach (prone position). This makes it easier for oxygen to get into the lungs.  Continuous positive airway pressure (CPAP) or bi-level positive airway pressure (BPAP) machine. This treatment uses mild air pressure to keep the airways open. A tube that is connected to a motor delivers oxygen to the body.  Ventilator. This treatment moves air into and out of the lungs by using a tube that is placed in your windpipe.  Tracheostomy. This is a procedure to create a hole in the neck so that a breathing tube can be inserted.  Extracorporeal membrane oxygenation (ECMO). This procedure gives the lungs a chance to recover by taking over the functions of the heart and lungs. It supplies oxygen to the body and removes carbon dioxide. Follow these instructions at home: Lifestyle  If you are sick, stay home except to get  medical care. Your health care provider will tell you how long to stay home. Call your health care provider before you go for medical care.  Rest at home as told by your health care provider.  Do not use any products that contain nicotine or tobacco, such as cigarettes, e-cigarettes, and chewing tobacco. If you need help quitting, ask your health care provider.  Return to your normal activities as told by your health care provider. Ask your health care provider what activities are safe for you. General instructions  Take over-the-counter and prescription medicines only as told by your health care provider.  Drink enough fluid to keep your urine pale yellow.  Keep all follow-up visits as told by your health care provider. This is important. How is this prevented?  There is no vaccine to help prevent COVID-19 infection. However, there are steps you can take to protect yourself and others from this virus. To protect yourself:   Do not travel to areas where COVID-19 is a risk. The areas where COVID-19 is reported change often. To identify high-risk areas and travel restrictions, check the CDC travel website: FatFares.com.br  If you live in, or must travel to, an area where COVID-19 is a risk, take precautions to avoid infection. ? Stay away from people who are sick. ? Wash your hands often with soap and water for 20 seconds. If soap and water are not available, use an alcohol-based hand sanitizer. ? Avoid touching your mouth, face, eyes, or nose. ? Avoid going out in public, follow guidance from your state and local health authorities. ? If you must go out in public, wear a cloth face covering or face mask. ? Disinfect objects and surfaces that are frequently touched every day. This may include:  Counters and tables.  Doorknobs and light switches.  Sinks and faucets.  Electronics, such as phones, remote controls, keyboards, computers, and tablets. To protect others: If  you have symptoms of COVID-19, take steps to prevent the virus from spreading to others.  If you think you have a COVID-19 infection, contact your health care provider right away. Tell your health care team that you think you may have a COVID-19 infection.  Stay home. Leave your house only to seek medical care. Do not use public transport.  Do not travel while you are sick.  Wash your hands often with soap and water for 20 seconds. If soap and water are not available, use alcohol-based hand sanitizer.  Stay away from other members of your household. Let healthy household  members care for children and pets, if possible. If you have to care for children or pets, wash your hands often and wear a mask. If possible, stay in your own room, separate from others. Use a different bathroom.  Make sure that all people in your household wash their hands well and often.  Cough or sneeze into a tissue or your sleeve or elbow. Do not cough or sneeze into your hand or into the air.  Wear a cloth face covering or face mask. Where to find more information  Centers for Disease Control and Prevention: PurpleGadgets.be  World Health Organization: https://www.castaneda.info/ Contact a health care provider if:  You live in or have traveled to an area where COVID-19 is a risk and you have symptoms of the infection.  You have had contact with someone who has COVID-19 and you have symptoms of the infection. Get help right away if:  You have trouble breathing.  You have pain or pressure in your chest.  You have confusion.  You have bluish lips and fingernails.  You have difficulty waking from sleep.  You have symptoms that get worse. These symptoms may represent a serious problem that is an emergency. Do not wait to see if the symptoms will go away. Get medical help right away. Call your local emergency services (911 in the [place] Do not drive yourself to the  hospital. Let the emergency medical personnel know if you think you have COVID-19. Summary  COVID-19 is a respiratory infection that is caused by a virus. It is also known as coronavirus disease or novel coronavirus. It can cause serious infections, such as pneumonia, acute respiratory distress syndrome, acute respiratory failure, or sepsis.  The virus that causes COVID-19 is contagious. This means that it can spread from person to person through droplets from coughs and sneezes.  You are more likely to develop a serious illness if you are 80 years of age or older, have a weak immunity, live in a nursing home, or have chronic disease.  There is no medicine to treat COVID-19. Your health care provider will talk with you about ways to treat your symptoms.  Take steps to protect yourself and others from infection. Wash your hands often and disinfect objects and surfaces that are frequently touched every day. Stay away from people who are sick and wear a mask if you are sick. This information is not intended to replace advice given to you by your health care provider. Make sure you discuss any questions you have with your health care provider. Document Released: 02/25/2018 Document Revised: 06/17/2018 Document Reviewed: 02/25/2018 Elsevier Patient Education  2020 [employer].

## 2019-01-06 NOTE — Telephone Encounter (Signed)
Post ED Visit - Positive Culture Follow-up: Unsuccessful Patient Follow-up  Culture assessed and recommendations reviewed by:  [x]  Elenor Quinones, Pharm.D. []  Heide Guile, Pharm.D., BCPS AQ-ID []  Parks Ne[place]une, Pharm.D., BCPS []  Alycia Rossetti, Pharm.D., BCPS []  [place], Pharm.D., BCPS, AAHIVP []  Legrand Como, Pharm.D., BCPS, AAHIVP []  Wynell Balloon, PharmD []  Vincenza Hews, PharmD, BCPS  Positive urine culture Symptom check  May need f/u []  Patient discharged without antimicrobial prescription and treatment is now indicated []  Organism is resistant to prescribed ED discharge antimicrobial []  Patient with positive blood cultures   Unable to contact patient after 3 attempts, letter will be sent to address on file  Genia Del 01/06/2019, 10:09 AM

## 2019-01-06 NOTE — Progress Notes (Signed)
PCCM: Thanks for calling her Garner Nash, DO [facility] 01/06/2019 8:45 AM

## 2019-01-10 ENCOUNTER — Other Ambulatory Visit: Payer: Medicaid Other

## 2019-01-10 ENCOUNTER — Telehealth: Payer: Self-pay

## 2019-01-10 ENCOUNTER — Telehealth: Payer: Self-pay | Admitting: Hematology

## 2019-01-10 ENCOUNTER — Ambulatory Visit: Payer: Medicaid Other | Admitting: Hematology

## 2019-01-10 NOTE — Telephone Encounter (Signed)
Scheduled appt per 12/7 sch message - mailed reminder letter with appt date and time

## 2019-01-11 NOTE — Progress Notes (Signed)
Virtual Visit via Telephone Note  I connected with Sheryl [name] on 01/13/19 at  9:00 AM EST by telephone and verified that I am speaking with the correct person using two identifiers.  Location: Patient: Home Provider: [facility] - [address], [place], [place], [place] [address]   I discussed the limitations, risks, security and privacy concerns of performing an evaluation and management service by telephone and the availability of in person appointments. I also discussed with the patient that there may be a patient responsible charge related to this service. The patient expressed understanding and agreed to proceed.  Patient consented to consult via telephone: Yes People present and their role in pt care: Pt    History of Present Illness:  48 year old female never smoker initially consulted with our practice on 12/15/2018 with Dr. Valeta Harms.  This was for evaluation of shortness of breath.  The shortness of breath developed after her weekly chemotherapy treatments.  She is followed at [facility] oncology Dr. Evelena Leyden for plasma cell leukemia and also followed at [facility].  Past medical history: Plasma cell leukemia, chronic hepatitis B, status post bone marrow transplant, status post bone marrow biopsy, GERD, UTI, fever, headache, COVID-19 positive (November/2020) Smoking history: Never smoker Maintenance: None Patient of Dr. Valeta Harms   Chief complaint: 1 week televisit follow up, covid+   48 year old female never smoker completing televisit with our office today.  Patient's daughter is utilized as Astronomer for the patient.  They report the patient continues to be asymptomatic despite her positive Covid diagnosis.  She has no cough, fatigue, shortness of breath, fevers, loss of taste or smell, body aches or chills.  This is great news.  Her only complaint is.  Neck pain.  This is a known issue.  Oncology reported on 12/3 that they would follow-up with the patient.  Patient  is scheduled for an upcoming appointment on 01/27/2019 to see me after completing a CT of her chest.  This will be an in office visit.  I have explained this to the daughter and I have given those dates.  Observations/Objective:  01/01/2019-SARS-CoV-2-positive  11/22/2018-CTA chest-no PE, groundglass type opacity in each lung base, concern for atypical organism pneumonia in the lung bases, scattered areas of atelectatic change, loculated ascites anterior and lateral to the spleen  01/01/2019-chest x-ray-no radiographic evidence of acute cardiopulmonary disease  Social History   Tobacco Use  Smoking Status Never Smoker  Smokeless Tobacco Never Used   Immunization History  Administered Date(s) Administered  . Influenza,inj,Quad PF,6+ Mos 10/27/2016, 10/22/2017, 10/25/2018    Assessment and Plan:  COVID-19 virus infection Patient continues to be asymptomatic Due to patient being asymptomatic she does not qualify for monoclonal antibody infusion outpatient  Plan: We will follow-up with patient in 2 weeks in office after completion of CT Counseled patient and patient's daughter to report any new or worsening symptoms Explained to them that if patient does develop symptoms we can work to coordinate a monoclonal antibody infusion   Cervical radiculopathy Interventions for patient's continued cervical /shoulder pain are limited due to the patient's current COVID-19 infection  Plan: Instructed patient to follow-up with oncology as well as with primary care We will route my note to primary care and oncology today If pain and symptoms worsen please present to an emergency room for further evaluation as well as pain management  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[place]) Plan: Continue to follow-up with oncology   Follow Up Instructions:  Return in about  2 weeks (around 01/27/2019), or if symptoms worsen or fail to improve, for After Chest CT, Follow up with Wyn Quaker FNP-C.   I discussed the  assessment and treatment plan with the patient. The patient was provided an opportunity to ask questions and all were answered. The patient agreed with the plan and demonstrated an understanding of the instructions.   The patient was advised to call back or seek an in-person evaluation if the symptoms worsen or if the condition fails to improve as anticipated.  I provided 15 minutes of non-face-to-fa 2 weeks after completion of CT ce time during this encounter.   [name], NP

## 2019-01-13 ENCOUNTER — Telehealth: Payer: Self-pay

## 2019-01-13 ENCOUNTER — Encounter: Payer: Self-pay | Admitting: Pulmonary Disease

## 2019-01-13 ENCOUNTER — Ambulatory Visit (INDEPENDENT_AMBULATORY_CARE_PROVIDER_SITE_OTHER): Payer: Medicaid Other | Admitting: Pulmonary Disease

## 2019-01-13 ENCOUNTER — Other Ambulatory Visit: Payer: Self-pay | Admitting: Hematology

## 2019-01-13 ENCOUNTER — Other Ambulatory Visit: Payer: Self-pay

## 2019-01-13 DIAGNOSIS — U071 COVID-19: Secondary | ICD-10-CM

## 2019-01-13 DIAGNOSIS — M5412 Radiculopathy, cervical region: Secondary | ICD-10-CM

## 2019-01-13 DIAGNOSIS — C9011 Plasma cell leukemia in remission: Secondary | ICD-10-CM

## 2019-01-13 MED ORDER — OXYCODONE HCL 5 MG PO TABS: 5.0000 mg | ORAL_TABLET | Freq: Three times a day (TID) | ORAL | 0 refills | Status: DC | PRN

## 2019-01-13 MED ORDER — ONDANSETRON 8 MG PO TBDP: 8.0000 mg | ORAL_TABLET | Freq: Three times a day (TID) | ORAL | 1 refills | Status: AC | PRN

## 2019-01-13 MED FILL — oxyCODONE HCL 5 MG TABS: 5 | 10 days supply | Qty: 30 | Fill #0

## 2019-01-13 MED FILL — ONDANSETRON ODT 8 MG TABLET: 8 | 13 days supply | Qty: 40 | Fill #0

## 2019-01-13 NOTE — Assessment & Plan Note (Addendum)
Patient continues to be asymptomatic Due to patient being asymptomatic she does not qualify for monoclonal antibody infusion outpatient  Plan: We will follow-up with patient in 2 weeks in office after completion of CT Counseled patient and patient's daughter to report any new or worsening symptoms Explained to them that if patient does develop symptoms we can work to coordinate a monoclonal antibody infusion

## 2019-01-13 NOTE — Patient Instructions (Signed)
You were seen today by [name], NP  for:   1. COVID-19 virus infection  Continue to monitor symptoms Continue to monitor your temperature multiple times a day If any new symptoms develop such as fever or worsening respiratory symptoms or loss of sense of smell or taste or body aches or chills please contact our office immediately  2. 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   Please do your part to reduce the spread of COVID-19:      Reduce your risk of any infection  and COVID19 by using the similar precautions used for avoiding the common cold or flu:  Marland Kitchen Wash your hands often with soap and warm water for at least 20 seconds.  If soap and water are not readily available, use an alcohol-based hand sanitizer with at least 60% alcohol.  . If coughing or sneezing, cover your mouth and nose by coughing or sneezing into the elbow areas of your shirt or coat, into a tissue or into your sleeve (not your hands). Langley Gauss A MASK when in public  . Avoid shaking hands with others and consider head nods or verbal greetings only. . Avoid touching your eyes, nose, or mouth with unwashed hands.  . Avoid close contact with people who are sick. . Avoid places or events with large numbers of people in one location, like concerts or sporting events. . If you have some symptoms but not all symptoms, continue to monitor at home and seek medical attention if your symptoms worsen. . If you are having a medical emergency, call 911.   [place] / e-Visit: [email or web address]         [facility]: [facility]: [id number]                   [facility]: [id number]     It is flu season:   >>> Best ways to protect herself from the flu: Receive the yearly flu vaccine, practice good hand hygiene washing with soap and also using hand sanitizer when available, eat a nutritious meals, get adequate rest, hydrate appropriately   Please contact the office if your symptoms worsen or you have concerns that you are not improving.   Thank you for choosing  [facility] for your healthcare, and for allowing [place] to partner with you on your healthcare journey. I am thankful to be able to provide care to you today.   Wyn Quaker FNP-C

## 2019-01-13 NOTE — Telephone Encounter (Signed)
Chart reviewed.

## 2019-01-13 NOTE — Assessment & Plan Note (Signed)
Interventions for patient's continued cervical /shoulder pain are limited due to the patient's current COVID-19 infection  Plan: Instructed patient to follow-up with oncology as well as with primary care We will route my note to primary care and oncology today If pain and symptoms worsen please present to an emergency room for further evaluation as well as pain management

## 2019-01-13 NOTE — Telephone Encounter (Signed)
After consulting with Dr. Burr Medico called patient's daughter [name] back.  Dr. Burr Medico is refilling Oxycodone and Zofran. She does get some relief with Oxycodone.   I instructed her to go ahead and call Dr. Maryjean Ka office to get an appointment for after the quarantine (21 days).  She was given their office number and verbalized an understanding.

## 2019-01-13 NOTE — Telephone Encounter (Signed)
Patient's daughter [name] calls stating that she is still not having any symptoms of Covid-19. But is having a lot of neck and should pain still.  She is wanting advice on what she should do.  [phone]

## 2019-01-13 NOTE — Assessment & Plan Note (Signed)
Plan: Continue to follow-up with oncology 

## 2019-01-19 ENCOUNTER — Telehealth: Payer: Self-pay

## 2019-01-19 NOTE — Telephone Encounter (Signed)
Patient's daughter calls requesting refill on Oxycodone - taking 4 a day.  Last filled 01/13/2019 #30.  She has not had an appointment with Dr. Maryjean Ka yet because she tested positive for Covid.

## 2019-01-24 ENCOUNTER — Other Ambulatory Visit: Payer: Self-pay | Admitting: Nurse Practitioner

## 2019-01-24 MED ORDER — OXYCODONE HCL 5 MG PO TABS: 5.0000 mg | ORAL_TABLET | Freq: Three times a day (TID) | ORAL | 0 refills | Status: DC | PRN

## 2019-01-24 MED FILL — oxyCODONE HCL 5 MG TABS: 5 | 10 days supply | Qty: 30 | Fill #0

## 2019-01-26 ENCOUNTER — Other Ambulatory Visit: Payer: Self-pay

## 2019-01-26 ENCOUNTER — Ambulatory Visit
Admission: RE | Admit: 2019-01-26 | Discharge: 2019-01-26 | Disposition: A | Discharge status: Home / Self Care | Payer: Medicaid Other | Source: Ambulatory Visit | Attending: Pulmonary Disease | Admitting: Pulmonary Disease

## 2019-01-26 ENCOUNTER — Ambulatory Visit (INDEPENDENT_AMBULATORY_CARE_PROVIDER_SITE_OTHER): Payer: Medicaid Other | Admitting: Internal Medicine

## 2019-01-26 VITALS — Wt 144.0 lb

## 2019-01-26 DIAGNOSIS — B181 Chronic viral hepatitis B without delta-agent: Secondary | ICD-10-CM | POA: Diagnosis present

## 2019-01-26 DIAGNOSIS — R0602 Shortness of breath: Secondary | ICD-10-CM

## 2019-01-26 DIAGNOSIS — D696 Thrombocytopenia, unspecified: Secondary | ICD-10-CM

## 2019-01-26 MED ORDER — TENOFOVIR DISOPROXIL FUMARATE 300 MG PO TABS: 300.0000 mg | ORAL_TABLET | Freq: Every day | ORAL | 11 refills | Status: DC

## 2019-01-26 MED FILL — TENOFOVIR DISOPROXIL FUMARA: 300 | 30 days supply | Qty: 30 | Fill #0

## 2019-01-26 NOTE — Progress Notes (Signed)
_0  ID: Toy Care, female    DOB: 1970/06/26, 48 y.o.   MRN: [id number]  Chief Complaint  Patient presents with  . Follow-up    F/U after COVID. Per daughter, her breathing has been going well despite her constant body pain.     Referring provider: [name], MD  HPI:  48 year old female never smoker initially consulted with our practice on 12/15/2018 with Dr. Valeta Harms.  This was for evaluation of shortness of breath.  The shortness of breath developed after her weekly chemotherapy treatments.  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  Past medical history: Plasma cell leukemia, chronic hepatitis B, status post bone marrow transplant, status post bone marrow biopsy, GERD, UTI, fever, headache, COVID-19 positive (November/2020) Smoking history: Never smoker Maintenance: None Patient of Dr. Valeta Harms  01/27/2019  - Visit   48 year old female presented to our office today after completing CT of her chest.  Patient is status post COVID-19 infection.  She was asymptomatic through the entire course.  She has had recent follow-up with infectious disease.  They are working on resuming treatment for her chronic hepatitis B.  Patient presenting today with her daughter who is helping translate.  Patient recently completed a CT chest.  Patient's primary concern today is her ongoing pain.  This has been managed by oncology.  She has an upcoming appointment with Dr. Maryjean Ka with pain management next week.  Her pain she is rating today is a 10 out of 10.  They report they will follow-up with oncology but they have concerns regarding the office schedules they may not have resolution on this through the weekend.  Patient is a high risk to present back to the emergency room for worsening pain.  Tests:   01/01/2019-SARS-CoV-2-positive  11/22/2018-CTA chest-no PE, groundglass type opacity in each lung base, concern for atypical  organism pneumonia in the lung bases, scattered areas of atelectatic change, loculated ascites anterior and lateral to the spleen  01/01/2019-chest x-ray-no radiographic evidence of acute cardiopulmonary disease  01/26/2019-CT chest without contrast-persistent groundglass infiltrates in the lower lobes posteriorly, unchanged on the left and slightly improved on the right, new small focal areas of peripheral infiltrate at the right lung base laterally, stable chronic loculated fluid collections around spleen  FENO:  No results found for: NITRICOXIDE  PFT: No flowsheet data found.  WALK:  No flowsheet data found.  Imaging: DG Chest 2 View  Result Date: 01/01/2019 CLINICAL DATA:  48 year old female with history of left-sided shoulder pain for 1 month, progressively worsening over the past 5 days. EXAM: CHEST - 2 VIEW COMPARISON:  Chest x-ray 12/02/2018. FINDINGS: Right internal jugular double-lumen power porta cath with tip terminating at the superior cavoatrial junction. Lung volumes are normal. No consolidative airspace disease. No pleural effusions. No pneumothorax. No pulmonary nodule or mass noted. Pulmonary vasculature and the cardiomediastinal silhouette are within normal limits. Small bone island noted in the right scapula. IMPRESSION: No radiographic evidence of acute cardiopulmonary disease. Electronically Signed   By: Vinnie Langton M.D.   On: 01/01/2019 18:12   CT Chest Wo Contrast  Result Date: 01/26/2019 CLINICAL DATA:  Shortness of breath. COVID-19. EXAM: CT CHEST WITHOUT CONTRAST TECHNIQUE: Multidetector CT imaging of the chest was performed following the standard protocol without IV contrast. COMPARISON:  Chest CT dated 11/22/2018 and chest x-ray dated 01/01/2019 FINDINGS: Cardiovascular: No significant vascular findings. Normal heart size. No pericardial effusion. Power port in  place with the tip in the right atrium. Mediastinum/Nodes: No enlarged mediastinal or axillary  lymph nodes. Thyroid gland, trachea, and esophagus demonstrate no significant findings. Lungs/Pleura: There are persistent ground-glass infiltrates in the lower lobes posteriorly, unchanged on the left and slightly improved on the right except for a new small focal area of peripheral infiltrate at the right lung base laterally measuring only a 13 mm. No effusions. Upper Abdomen: Stable appearance since the prior exam. Chronic loculated fluid collections are around the spleen. Musculoskeletal: No chest wall mass or suspicious bone lesions identified. IMPRESSION: 1. Persistent ground-glass infiltrates in the lower lobes posteriorly, unchanged on the left and slightly improved on the right. 2. New small focal area of peripheral infiltrate at the right lung base laterally. 3. Stable chronic loculated fluid collections around the spleen. Electronically Signed   By: Lorriane Shire M.D.   On: 01/26/2019 17:33   DG Shoulder Left  Result Date: 01/01/2019 CLINICAL DATA:  Left shoulder pain for 1 month, worsened for 5 days EXAM: LEFT SHOULDER - 2+ VIEW COMPARISON:  None. FINDINGS: There is no evidence of fracture or dislocation. There is no evidence of arthropathy or other focal bone abnormality. Soft tissues are unremarkable. IMPRESSION: Negative. Electronically Signed   By: Monte Fantasia M.D.   On: 01/01/2019 18:11    Lab Results:  CBC    Component Value Date/Time   WBC 7.5 01/01/2019 2015   RBC 4.12 01/01/2019 2015   HGB 12.4 01/01/2019 2015   HGB 11.2 (L) 12/23/2018 1053   HGB 10.4 (L) 02/02/2017 1317   HCT 40.2 01/01/2019 2015   HCT 33.9 (L) 02/02/2017 1317   PLT 217 01/01/2019 2015   PLT 233 12/23/2018 1053   PLT 172 02/02/2017 1317   MCV 97.6 01/01/2019 2015   MCV 87.6 02/02/2017 1317   MCH 30.1 01/01/2019 2015   MCHC 30.8 01/01/2019 2015   RDW 13.8 01/01/2019 2015   RDW 14.1 02/02/2017 1317   LYMPHSABS 1.6 01/01/2019 2015   LYMPHSABS 0.7 (L) 02/02/2017 1317   MONOABS 1.1 (H) 01/01/2019  2015   MONOABS 0.1 02/02/2017 1317   EOSABS 0.1 01/01/2019 2015   EOSABS 0.0 02/02/2017 1317   BASOSABS 0.0 01/01/2019 2015   BASOSABS 0.0 02/02/2017 1317    BMET    Component Value Date/Time   NA 142 01/26/2019 1643   NA 140 02/02/2017 1317   K 3.6 01/26/2019 1643   K 4.0 02/02/2017 1317   CL 103 01/26/2019 1643   CO2 31 01/26/2019 1643   CO2 25 02/02/2017 1317   GLUCOSE 112 (H) 01/26/2019 1643   GLUCOSE 121 02/02/2017 1317   BUN 13 01/26/2019 1643   BUN 13.8 02/02/2017 1317   CREATININE 0.87 01/26/2019 1643   CREATININE 0.8 02/02/2017 1317   CALCIUM 9.3 01/26/2019 1643   CALCIUM 9.3 02/02/2017 1317   GFRNONAA 78 01/26/2019 1643   GFRAA 90 01/26/2019 1643    BNP No results found for: BNP  ProBNP No results found for: PROBNP  Specialty Problems      Pulmonary Problems   Healthcare-associated pneumonia   HCAP (healthcare-associated pneumonia)   Influenza B   Right lower lobe pneumonia   Cough   Pneumonia      No Known Allergies  Immunization History  Administered Date(s) Administered  . Influenza,inj,Quad PF,6+ Mos 10/27/2016, 10/22/2017, 10/25/2018    Past Medical History:  Diagnosis Date  . Chills with fever    intermittently since d/c from hospital  . Dysuria-frequency syndrome  w/ pink urine  . GERD (gastroesophageal reflux disease)   . Hepatitis   . History of positive PPD    DX 2011--  CXR DONE NO EVIDENCE  . History of ureter stent   . Hydronephrosis, right   . Neuromuscular disorder (HCC)    legs numb intermittently  . 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[place])   . Pneumonia   . Right ureteral stone   . Urosepsis 8/14   admitted to [facility]    Tobacco History: Social History   Tobacco Use  Smoking Status Never Smoker  Smokeless Tobacco Never Used   Counseling given: Yes   Continue to not smoke  Outpatient Encounter Medications as of 01/27/2019  Medication Sig  . acyclovir (ZOVIRAX) 800 MG tablet Take 1 tablet (800 mg total) by mouth 2  (two) times daily.  Marland Kitchen aspirin 81 MG chewable tablet Chew 81 mg by mouth daily.  Marland Kitchen dexamethasone (DECADRON) 2 MG tablet Take 2 tablets (4 mg total) by mouth 2 (two) times daily.  . feeding supplement, ENSURE ENLIVE, (ENSURE ENLIVE) LIQD Take 237 mLs by mouth 2 (two) times daily between meals.  Marland Kitchen ibuprofen (ADVIL,MOTRIN) 200 MG tablet Take 200 mg by mouth every 6 (six) hours as needed for moderate pain.  Marland Kitchen omeprazole (PRILOSEC) 20 MG capsule Take 1 capsule (20 mg total) by mouth daily.  . ondansetron (ZOFRAN ODT) 8 MG disintegrating tablet Take 1 tablet (8 mg total) by mouth every 8 (eight) hours as needed for nausea or vomiting.  Marland Kitchen oxyCODONE (OXY IR/ROXICODONE) 5 MG immediate release tablet Take 1 tablet (5 mg total) by mouth every 8 (eight) hours as needed for severe pain.  . polyethylene glycol (MIRALAX / GLYCOLAX) packet Take 17 g by mouth daily as needed (constipation).  . prochlorperazine (COMPAZINE) 10 MG tablet Take 1 tablet (10 mg total) by mouth every 6 (six) hours as needed for nausea or vomiting.  Marland Kitchen tenofovir (VIREAD) 300 MG tablet Take 1 tablet (300 mg total) by mouth daily.  . traMADol (ULTRAM) 50 MG tablet Take 1 tablet (50 mg total) by mouth every 6 (six) hours as needed.  . [DISCONTINUED] oxyCODONE (OXY IR/ROXICODONE) 5 MG immediate release tablet Take 1 tablet (5 mg total) by mouth every 8 (eight) hours as needed for severe pain.  . [DISCONTINUED] chlorpheniramine-HYDROcodone (TUSSIONEX) 10-8 MG/5ML SUER Take 5 mLs by mouth every 12 (twelve) hours as needed for cough. (Patient not taking: Reported on 01/26/2019)  . [DISCONTINUED] magic mouthwash SOLN Take 5 mLs by mouth 4 (four) times daily as needed for mouth pain. (Patient not taking: Reported on 01/26/2019)  . [DISCONTINUED] ondansetron (ZOFRAN ODT) 8 MG disintegrating tablet Take 1 tablet (8 mg total) by mouth every 8 (eight) hours as needed for nausea or vomiting.  . [DISCONTINUED] oxyCODONE (OXY IR/ROXICODONE) 5 MG immediate  release tablet Take 1 tablet (5 mg total) by mouth every 8 (eight) hours as needed for severe pain.  . [DISCONTINUED] pomalidomide (POMALYST) 2 MG capsule Take 1 capsule (2 mg total) by mouth daily. Take with water on days 1-21. Repeat every 28 days. (Patient not taking: Reported on 01/26/2019)  . [DISCONTINUED] tenofovir (VIREAD) 300 MG tablet Take 1 tablet (300 mg total) by mouth daily. (Patient not taking: Reported on 12/23/2018)   Facility-Administered Encounter Medications as of 01/27/2019  Medication  . sodium chloride 0.9 % injection 10 mL  . sodium chloride 0.9 % injection 10 mL  . sodium chloride 0.9 % injection 10 mL  . sodium chloride flush (NS)  0.9 % injection 10 mL  . sodium chloride flush (NS) 0.9 % injection 10 mL     Review of Systems  Review of Systems  Constitutional: Negative for activity change, fatigue and fever.  HENT: Negative for sinus pressure, sinus pain and sore throat.   Respiratory: Negative for cough, shortness of breath and wheezing.   Cardiovascular: Negative for chest pain and palpitations.  Gastrointestinal: Negative for diarrhea, nausea and vomiting.  Musculoskeletal: Positive for neck pain. Negative for arthralgias.  Neurological: Negative for dizziness.  Psychiatric/Behavioral: Negative for sleep disturbance. The patient is not nervous/anxious.      Physical Exam  BP 138/88   Pulse 96   Temp (!) 97.4 F (36.3 C) (Temporal)   Ht _0  (1.6 m)   Wt 147 lb (66.7 kg)   LMP 11/24/2014   SpO2 98%   BMI 26.04 kg/m   Wt Readings from Last 5 Encounters:  01/27/19 147 lb (66.7 kg)  01/26/19 144 lb (65.3 kg)  01/01/19 143 lb 4.8 oz (65 kg)  12/23/18 143 lb 4.8 oz (65 kg)  12/23/18 144 lb 11.2 oz (65.6 kg)    BMI Readings from Last 5 Encounters:  01/27/19 26.04 kg/m  01/26/19 25.51 kg/m  01/01/19 25.38 kg/m  12/23/18 25.38 kg/m  12/23/18 25.63 kg/m    Physical Exam Vitals and nursing note reviewed.  Constitutional:      General:  She is not in acute distress.    Appearance: Normal appearance.  HENT:     Head: Normocephalic and atraumatic.     Right Ear: External ear normal. There is no impacted cerumen.     Left Ear: External ear normal. There is no impacted cerumen.     Nose: Nose normal. No congestion.     Mouth/Throat:     Mouth: Mucous membranes are moist.     Pharynx: Oropharynx is clear.  Eyes:     Pupils: Pupils are equal, round, and reactive to light.  Cardiovascular:     Rate and Rhythm: Normal rate and regular rhythm.     Pulses: Normal pulses.     Heart sounds: Normal heart sounds. No murmur.  Pulmonary:     Effort: Pulmonary effort is normal. No respiratory distress.     Breath sounds: No decreased air movement. No decreased breath sounds, wheezing or rales.  Musculoskeletal:     Cervical back: Normal range of motion.  Skin:    General: Skin is warm and dry.     Capillary Refill: Capillary refill takes less than 2 seconds.  Neurological:     General: No focal deficit present.     Mental Status: She is alert and oriented to person, place, and time. Mental status is at baseline.     Gait: Gait normal.  Psychiatric:        Mood and Affect: Mood normal.        Behavior: Behavior normal.        Thought Content: Thought content normal.        Judgment: Judgment normal.       Assessment & Plan:   Chronic hepatitis B without delta agent without hepatic coma ([place]) Patient: Continue to follow-up with infectious disease  Abnormal findings on diagnostic imaging of lung Plan: I will review the CT with Dr. Valeta Harms.  I believe we will hold off on bronchoscopy at this point in time Likely will consider repeat CT imaging in 3 to 6 months with a high-resolution CT chest  COVID-19 virus  infection Plan: Patient is status post COVID-19 infection CT chest shows GGO We will continue to monitor the patient We will likely need repeat CT imaging in 3 to 6 months  Cancer related pain I have reviewed [place]  PMP aware  Plan: Short course of oxycodone today Patient has high risk for presenting back to the emergency room or an acute care setting due to severe pain Patient has upcoming scheduled follow-up with pain management next week  Cervical radiculopathy Plan: Short course of oxycodone today Patient have upcoming appointment with pain management next week   Before prescribing the patient with oxycodone, I have checked [place] PMP aware and the patients overdose risk score is 350. Patient has 4 providers prescribing controlled substances. Patient has used 3 pharmacies. I have counseled the patient on the sedative effects of oxycodone. Patient to use this medication sparingly and not when driving, drinking alcohol, or using additional sedative medications. Patient has been prescribed 20 tablets with no refills.    Return in about 2 months (around 03/30/2019), or if symptoms worsen or fail to improve, for Follow up with Dr. Valeta Harms.   [name], NP 01/27/2019   This appointment was 26 minutes long with over 50% of the time in direct face-to-face patient care, assessment, plan of care, and follow-up.

## 2019-01-26 NOTE — Progress Notes (Signed)
RFV: chronic hepatitis B  Patient ID: Sheryl [name], female   DOB: 05/31/1970, 48 y.o.   MRN: [id number]  HPI 48yo [place]/vietnamese speaking female with history of chronic hep B, acute plasma cell leukemia previously on carfilzomib, dexamethasone and pomalidomide, s/p transplant at [facility]. 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 I had first seen her in March 2019 for acute transaminitis due to hep B flare where she was off of her entecavir due to loss of insurance. She was subsequently transitioned to tenofovir 300mg  daily for which she took up until  the last 2 months, off of viread since unable to get medication.  While being off of tenofovir, she denies having any hepatitis flare.  Pinch nerve to left arm - seeing dr Maryjean Ka, for evaluation.  No symptoms from covid    Outpatient Encounter Medications as of 01/26/2019  Medication Sig  . acyclovir (ZOVIRAX) 800 MG tablet Take 1 tablet (800 mg total) by mouth 2 (two) times daily.  Marland Kitchen aspirin 81 MG chewable tablet Chew 81 mg by mouth daily.  . chlorpheniramine-HYDROcodone (TUSSIONEX) 10-8 MG/5ML SUER Take 5 mLs by mouth every 12 (twelve) hours as needed for cough.  . dexamethasone (DECADRON) 2 MG tablet Take 2 tablets (4 mg total) by mouth 2 (two) times daily.  . feeding supplement, ENSURE ENLIVE, (ENSURE ENLIVE) LIQD Take 237 mLs by mouth 2 (two) times daily between meals.  Marland Kitchen ibuprofen (ADVIL,MOTRIN) 200 MG tablet Take 200 mg by mouth every 6 (six) hours as needed for moderate pain.  . magic mouthwash SOLN Take 5 mLs by mouth 4 (four) times daily as needed for mouth pain.  Marland Kitchen omeprazole (PRILOSEC) 20 MG capsule Take 1 capsule (20 mg total) by mouth daily.  . ondansetron (ZOFRAN ODT) 8 MG disintegrating tablet Take 1 tablet (8 mg total) by mouth every 8 (eight) hours as needed for nausea or vomiting.  Marland Kitchen oxyCODONE (OXY IR/ROXICODONE) 5 MG immediate  release tablet Take 1 tablet (5 mg total) by mouth every 8 (eight) hours as needed for severe pain.  . polyethylene glycol (MIRALAX / GLYCOLAX) packet Take 17 g by mouth daily as needed (constipation).  . pomalidomide (POMALYST) 2 MG capsule Take 1 capsule (2 mg total) by mouth daily. Take with water on days 1-21. Repeat every 28 days.  . prochlorperazine (COMPAZINE) 10 MG tablet Take 1 tablet (10 mg total) by mouth every 6 (six) hours as needed for nausea or vomiting.  Marland Kitchen tenofovir (VIREAD) 300 MG tablet Take 1 tablet (300 mg total) by mouth daily. (Patient not taking: Reported on 12/23/2018)  . traMADol (ULTRAM) 50 MG tablet Take 1 tablet (50 mg total) by mouth every 6 (six) hours as needed.   Facility-Administered Encounter Medications as of 01/26/2019  Medication  . sodium chloride 0.9 % injection 10 mL  . sodium chloride 0.9 % injection 10 mL  . sodium chloride 0.9 % injection 10 mL  . sodium chloride flush (NS) 0.9 % injection 10 mL  . sodium chloride flush (NS) 0.9 % injection 10 mL     Patient Active Problem List   Diagnosis Date Noted  . COVID-19 virus infection 01/05/2019  . Cervical radiculopathy 03/15/2018  . Pneumonia 08/27/2017  . Hypokalemia 03/03/2017  . Cough   . Hyperbilirubinemia   . SIRS (systemic inflammatory response syndrome) ([place]) 02/16/2017  . Thrush 02/16/2017  . Chills with fever   . Right lower lobe pneumonia 02/09/2017  .  Transaminitis 02/09/2017  . HCAP (healthcare-associated pneumonia) 05/11/2016  . Influenza B 05/11/2016  . Vomiting 05/11/2016  . Leukopenia 05/11/2016  . Plasma cell leukemia in relapse ([place]) 05/11/2016  . Hypersensitivity reaction 04/18/2016  . Hemorrhoid 12/03/2015  . Headache 12/03/2015  . Dehydration 11/14/2015  . Fever 11/14/2015  . 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 Port catheter in place 05/30/2015  . Acute pyelonephritis   . Immunosuppressed status ([place])   . UTI (urinary tract infection) 05/21/2015  . Sepsis  ([place]) 05/20/2015  . Hepatitis 10/24/2014  . 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[place]) 10/24/2014  . GIB (gastrointestinal bleeding) 10/07/2014  . Healthcare-associated pneumonia 10/07/2014  . Constipation 09/17/2014  . Leukocytosis 09/14/2014  . Nausea & vomiting 09/14/2014  . Abdominal pain 09/14/2014  . Abnormal LFTs 09/14/2014  . Bilateral leg numbness 09/14/2014  . Normocytic anemia 09/14/2014  . GERD (gastroesophageal reflux disease)   . Chronic hepatitis B without delta agent without hepatic coma (HCC)   . Gastroesophageal reflux disease without esophagitis      Health Maintenance Due  Topic Date Due  . URINE MICROALBUMIN  06/28/1978  . TETANUS/TDAP  06/28/1987  . PAP SMEAR-Modifier  06/27/1989  . MAMMOGRAM  06/28/2018    Social History   Tobacco Use  . Smoking status: Never Smoker  . Smokeless tobacco: Never Used  Substance Use Topics  . Alcohol use: No    Alcohol/week: 0.0 standard drinks  . Drug use: No  family history includes Asthma in her father; Lung disease in her father; Stomach cancer in her mother. Review of Systems 12 point ros is negative except what is mentioned in hpi Physical Exam   Wt 144 lb (65.3 kg)   LMP 11/24/2014   SpO2 100%   BMI 25.51 kg/m  Physical Exam  Constitutional:  oriented to person, place, and time. appears well-developed and well-nourished. No distress.  HENT: [place]/AT, PERRLA, no scleral icterus Mouth/Throat: Oropharynx is clear and moist. No oropharyngeal exudate.  Cardiovascular: Normal rate, regular rhythm and normal heart sounds. Exam reveals no gallop and no friction rub.  No murmur heard.  Pulmonary/Chest: Effort normal and breath sounds normal. No respiratory distress.  has no wheezes.  Neck = supple, no nuchal rigidity Abdominal: Soft. Bowel sounds are normal.  exhibits no distension. There is no tenderness.  Lymphadenopathy: no cervical adenopathy. No axillary adenopathy Neurological: alert and oriented to person, place, and time.    Skin: Skin is warm and dry. No rash noted. No erythema.  Psychiatric: a normal mood and affect.  behavior is normal.    CBC Lab Results  Component Value Date   WBC 7.5 01/01/2019   RBC 4.12 01/01/2019   HGB 12.4 01/01/2019   HCT 40.2 01/01/2019   PLT 217 01/01/2019   MCV 97.6 01/01/2019   MCH 30.1 01/01/2019   MCHC 30.8 01/01/2019   RDW 13.8 01/01/2019   LYMPHSABS 1.6 01/01/2019   MONOABS 1.1 (H) 01/01/2019   EOSABS 0.1 01/01/2019    BMET Lab Results  Component Value Date   NA 139 01/01/2019   K 4.2 01/01/2019   CL 102 01/01/2019   CO2 27 01/01/2019   GLUCOSE 95 01/01/2019   BUN 21 (H) 01/01/2019   CREATININE 0.68 01/01/2019   CALCIUM 8.8 (L) 01/01/2019   GFRNONAA >60 01/01/2019   GFRAA >60 01/01/2019      Assessment and Plan  Chronic hepatitis B = needs to get restarted on viread 300mg  daily; will get CMP and hep B viral load. We will  also order RUQ elastrography to do surveillance for [facility]

## 2019-01-27 ENCOUNTER — Telehealth: Payer: Self-pay | Admitting: Pulmonary Disease

## 2019-01-27 ENCOUNTER — Encounter: Payer: Self-pay | Admitting: Pulmonary Disease

## 2019-01-27 ENCOUNTER — Ambulatory Visit: Payer: Medicaid Other | Admitting: Pulmonary Disease

## 2019-01-27 VITALS — BP 138/88 | HR 96 | Temp 97.4°F | Ht 63.0 in | Wt 147.0 lb

## 2019-01-27 DIAGNOSIS — U071 COVID-19: Secondary | ICD-10-CM | POA: Diagnosis not present

## 2019-01-27 DIAGNOSIS — G893 Neoplasm related pain (acute) (chronic): Secondary | ICD-10-CM | POA: Diagnosis not present

## 2019-01-27 DIAGNOSIS — R918 Other nonspecific abnormal finding of lung field: Secondary | ICD-10-CM | POA: Diagnosis not present

## 2019-01-27 DIAGNOSIS — B181 Chronic viral hepatitis B without delta-agent: Secondary | ICD-10-CM

## 2019-01-27 DIAGNOSIS — M5412 Radiculopathy, cervical region: Secondary | ICD-10-CM | POA: Diagnosis not present

## 2019-01-27 MED ORDER — OXYCODONE HCL 5 MG PO TABS: 5.0000 mg | ORAL_TABLET | Freq: Three times a day (TID) | ORAL | 0 refills | Status: DC | PRN

## 2019-01-27 NOTE — Assessment & Plan Note (Signed)
Patient: Continue to follow-up with infectious disease

## 2019-01-27 NOTE — Patient Instructions (Addendum)
You were seen today by [name], NP  for:   1. COVID-19 virus infection  Likely will need to repeat CT imaging in 6 months  I will review CT with Dr. Valeta Harms  2. Cervical radiculopathy  - oxyCODONE (OXY IR/ROXICODONE) 5 MG immediate release tablet; Take 1 tablet (5 mg total) by mouth every 8 (eight) hours as needed for severe pain.  Dispense: 20 tablet; Refill: 0  Continue to follow-up with Dr. Luan Pulling next week  3. Cancer related pain  - oxyCODONE (OXY IR/ROXICODONE) 5 MG immediate release tablet; Take 1 tablet (5 mg total) by mouth every 8 (eight) hours as needed for severe pain.  Dispense: 20 tablet; Refill: 0  4. Abnormal findings on diagnostic imaging of lung  I will review your CT with Dr. Valeta Harms   We recommend today:  Meds ordered this encounter  Medications  . oxyCODONE (OXY IR/ROXICODONE) 5 MG immediate release tablet    Sig: Take 1 tablet (5 mg total) by mouth every 8 (eight) hours as needed for severe pain.    Dispense:  20 tablet    Refill:  0    Follow Up:    Return in about 2 months (around 03/30/2019), or if symptoms worsen or fail to improve, for Follow up with Dr. Valeta Harms.   Please do your part to reduce the spread of COVID-19:      Reduce your risk of any infection  and COVID19 by using the similar precautions used for avoiding the common cold or flu:  Marland Kitchen Wash your hands often with soap and warm water for at least 20 seconds.  If soap and water are not readily available, use an alcohol-based hand sanitizer with at least 60% alcohol.  . If coughing or sneezing, cover your mouth and nose by coughing or sneezing into the elbow areas of your shirt or coat, into a tissue or into your sleeve (not your hands). Langley Gauss A MASK when in public  . Avoid shaking hands with others and consider head nods or verbal greetings only. . Avoid touching your eyes, nose, or mouth with unwashed hands.  . Avoid close contact with people who are sick. . Avoid places or events  with large numbers of people in one location, like concerts or sporting events. . If you have some symptoms but not all symptoms, continue to monitor at home and seek medical attention if your symptoms worsen. . If you are having a medical emergency, call 911.   [place] / e-Visit: [email or web address]         [facility]: [facility]: [id number]                   [facility]: [id number]     It is flu season:   >>> Best ways to protect herself from the flu: Receive the yearly flu vaccine, practice good hand hygiene washing with soap and also using hand sanitizer when available, eat a nutritious meals, get adequate rest, hydrate appropriately   Please contact the office if your symptoms worsen or you have concerns that you are not improving.   Thank you for choosing [facility] for your healthcare, and for allowing [place] to partner with you on your healthcare journey. I am thankful to be able to provide care to you today.   Wyn Quaker FNP-C

## 2019-01-27 NOTE — Assessment & Plan Note (Signed)
Plan: I will review the CT with Dr. Valeta Harms.  I believe we will hold off on bronchoscopy at this point in time Likely will consider repeat CT imaging in 3 to 6 months with a high-resolution CT chest

## 2019-01-27 NOTE — Telephone Encounter (Signed)
Spoke with patient's daughter [name]. She stated that she went over to [place] to get the Oxycodone RX but they advised her that she could not receive the RX until 30th. She has recently picked up a RX on 12/21. Advised her that we can not override the pharmacy's policy. She verbalized understanding. She will wait until the 30th.   Nothing further needed at time of call.

## 2019-01-27 NOTE — Assessment & Plan Note (Signed)
Plan: Short course of oxycodone today Patient have upcoming appointment with pain management next week

## 2019-01-27 NOTE — Assessment & Plan Note (Signed)
I have reviewed [place] PMP aware  Plan: Short course of oxycodone today Patient has high risk for presenting back to the emergency room or an acute care setting due to severe pain Patient has upcoming scheduled follow-up with pain management next week

## 2019-01-27 NOTE — Assessment & Plan Note (Signed)
Plan: Patient is status post COVID-19 infection CT chest shows GGO We will continue to monitor the patient We will likely need repeat CT imaging in 3 to 6 months

## 2019-01-29 ENCOUNTER — Emergency Department (HOSPITAL_COMMUNITY): Payer: Medicaid Other

## 2019-01-29 ENCOUNTER — Emergency Department (HOSPITAL_COMMUNITY)
Admission: EM | Admit: 2019-01-29 | Discharge: 2019-01-29 | Disposition: A | Discharge status: Home / Self Care | Payer: Medicaid Other | Attending: Emergency Medicine | Admitting: Emergency Medicine

## 2019-01-29 ENCOUNTER — Encounter (HOSPITAL_COMMUNITY): Payer: Self-pay

## 2019-01-29 ENCOUNTER — Other Ambulatory Visit: Payer: Self-pay

## 2019-01-29 DIAGNOSIS — R221 Localized swelling, mass and lump, neck: Secondary | ICD-10-CM | POA: Diagnosis not present

## 2019-01-29 DIAGNOSIS — M542 Cervicalgia: Secondary | ICD-10-CM | POA: Insufficient documentation

## 2019-01-29 DIAGNOSIS — M25512 Pain in left shoulder: Secondary | ICD-10-CM | POA: Insufficient documentation

## 2019-01-29 DIAGNOSIS — Z7982 Long term (current) use of aspirin: Secondary | ICD-10-CM | POA: Insufficient documentation

## 2019-01-29 DIAGNOSIS — U071 COVID-19: Secondary | ICD-10-CM | POA: Insufficient documentation

## 2019-01-29 DIAGNOSIS — Z79899 Other long term (current) drug therapy: Secondary | ICD-10-CM | POA: Diagnosis not present

## 2019-01-29 DIAGNOSIS — M549 Dorsalgia, unspecified: Secondary | ICD-10-CM | POA: Insufficient documentation

## 2019-01-29 DIAGNOSIS — Z8579 Personal history of other malignant neoplasms of lymphoid, hematopoietic and related tissues: Secondary | ICD-10-CM | POA: Diagnosis not present

## 2019-01-29 LAB — CBC WITH DIFFERENTIAL/PLATELET
Abs Immature Granulocytes: 0.03 10*3/uL (ref 0.00–0.07)
Basophils Absolute: 0 10*3/uL (ref 0.0–0.1)
Basophils Relative: 0 %
Eosinophils Absolute: 0 10*3/uL (ref 0.0–0.5)
Eosinophils Relative: 0 %
HCT: 33.2 % — ABNORMAL LOW (ref 36.0–46.0)
Hemoglobin: 10.6 g/dL — ABNORMAL LOW (ref 12.0–15.0)
Immature Granulocytes: 1 %
Lymphocytes Relative: 25 %
Lymphs Abs: 1.1 10*3/uL (ref 0.7–4.0)
MCH: 30.7 pg (ref 26.0–34.0)
MCHC: 31.9 g/dL (ref 30.0–36.0)
MCV: 96.2 fL (ref 80.0–100.0)
Monocytes Absolute: 0.5 10*3/uL (ref 0.1–1.0)
Monocytes Relative: 11 %
Neutro Abs: 2.8 10*3/uL (ref 1.7–7.7)
Neutrophils Relative %: 63 %
Platelets: 195 10*3/uL (ref 150–400)
RBC: 3.45 MIL/uL — ABNORMAL LOW (ref 3.87–5.11)
RDW: 13.5 % (ref 11.5–15.5)
WBC: 4.5 10*3/uL (ref 4.0–10.5)
nRBC: 0 % (ref 0.0–0.2)

## 2019-01-29 LAB — BASIC METABOLIC PANEL
Anion gap: 9 (ref 5–15)
BUN: 13 mg/dL (ref 6–20)
CO2: 28 mmol/L (ref 22–32)
Calcium: 9.2 mg/dL (ref 8.9–10.3)
Chloride: 105 mmol/L (ref 98–111)
Creatinine, Ser: 0.68 mg/dL (ref 0.44–1.00)
GFR calc Af Amer: >60 mL/min (ref >60)
GFR calc non Af Amer: >60 mL/min (ref >60)
Glucose, Bld: 100 mg/dL — ABNORMAL HIGH (ref 70–99)
Potassium: 3.7 mmol/L (ref 3.5–5.1)
Sodium: 142 mmol/L (ref 135–145)

## 2019-01-29 LAB — RESPIRATORY PANEL BY RT PCR (FLU A&B, COVID)
Influenza A by PCR: NEGATIVE
Influenza B by PCR: NEGATIVE
SARS Coronavirus 2 by RT PCR: POSITIVE — AB

## 2019-01-29 MED ORDER — OXYCODONE HCL 5 MG PO TABS
5.0000 mg | ORAL_TABLET | Freq: Once | ORAL | Status: AC
  Administered 2019-01-29: 5 mg via ORAL
  Filled 2019-01-29: qty 1

## 2019-01-29 MED ORDER — HEPARIN SOD (PORK) LOCK FLUSH 100 UNIT/ML IV SOLN: 500.0000 [IU] | Freq: Once | INTRAVENOUS | Status: AC

## 2019-01-29 MED ORDER — MORPHINE SULFATE 15 MG PO TABS: 15.0000 mg | ORAL_TABLET | ORAL | 0 refills | Status: DC | PRN

## 2019-01-29 MED ORDER — KETOROLAC TROMETHAMINE 60 MG/2ML IM SOLN
15.0000 mg | Freq: Once | INTRAMUSCULAR | Status: AC
  Administered 2019-01-29: 15 mg via INTRAMUSCULAR
  Filled 2019-01-29: qty 2

## 2019-01-29 MED ORDER — HEPARIN SOD (PORK) LOCK FLUSH 100 UNIT/ML IV SOLN
INTRAVENOUS | Status: AC
  Administered 2019-01-29: 500 [IU]
  Filled 2019-01-29: qty 5

## 2019-01-29 MED ORDER — ACETAMINOPHEN 500 MG PO TABS
1000.0000 mg | ORAL_TABLET | Freq: Once | ORAL | Status: AC
  Administered 2019-01-29: 1000 mg via ORAL
  Filled 2019-01-29: qty 2

## 2019-01-29 MED ORDER — DIAZEPAM 5 MG PO TABS
5.0000 mg | ORAL_TABLET | Freq: Once | ORAL | Status: AC
  Administered 2019-01-29: 5 mg via ORAL
  Filled 2019-01-29: qty 1

## 2019-01-29 MED ORDER — GADOBUTROL 1 MMOL/ML IV SOLN
7.0000 mL | Freq: Once | INTRAVENOUS | Status: AC | PRN
  Administered 2019-01-29: 7 mL via INTRAVENOUS

## 2019-01-29 NOTE — Discharge Instructions (Signed)
Please follow-up with your oncologist in the office at [facility].  They should get you an appointment either Monday or Tuesday to be seen.  Please call them on Monday to close the loop.  Please return to either the ED here or at [facility] if this pain is uncontrolled or if you feel that you are not having weakness or numbness to the area.  Take 4 over the counter ibuprofen tablets 3 times a day or 2 over-the-counter naproxen tablets twice a day for pain. Also take tylenol 1000mg (2 extra strength) four times a day.   Then take the pain medicine if you feel like you need it. Narcotics do not help with the pain, they only make you care about it less.  You can become addicted to this, people may break into your house to steal it.  It will constipate you.  If you drive under the influence of this medicine you can get a DUI.

## 2019-01-29 NOTE — ED Provider Notes (Addendum)
[facility] DEPT Provider Note   CSN: [id number] Arrival date & time: 01/29/19  4401     History Chief Complaint  Patient presents with  . Back Pain  . Shoulder Pain    Sheryl [name] is a 48 y.o. female.  48 yo F with chief complaints of left-sided neck pain.  Is been a chronic issue for her.  The patient has a history of leukemia, has been seen multiple times in the past 6 months or so with ongoing and worsening neck pain.  She recently has seen the pain management physicians but does not felt like that is helped much.  She denies trauma to the neck denies weakness or numbness to the arms.  Pain is mostly on the left side is sharp and radiates down the arm.  She now has some sensation that goes to the right as well.  Worse with turning of her head.  The history is provided by the patient.  Back Pain Associated symptoms: no chest pain, no dysuria, no fever and no headaches   Shoulder Pain Associated symptoms: back pain and neck pain   Associated symptoms: no fever   Illness Severity:  Moderate Onset quality:  Gradual Duration:  2 days Timing:  Constant Progression:  Worsening Chronicity:  New Associated symptoms: no chest pain, no congestion, no fever, no headaches, no myalgias, no nausea, no rhinorrhea, no shortness of breath, no vomiting and no wheezing        Past Medical History:  Diagnosis Date  . Chills with fever    intermittently since d/c from hospital  . Dysuria-frequency syndrome    w/ pink urine  . GERD (gastroesophageal reflux disease)   . Hepatitis   . History of positive PPD    DX 2011--  CXR DONE NO EVIDENCE  . History of ureter stent   . Hydronephrosis, right   . Neuromuscular disorder (HCC)    legs numb intermittently  . 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 Pneumonia   . Right ureteral stone   . Urosepsis 8/14   admitted to [facility]    Patient Active Problem List   Diagnosis Date Noted  . Cancer related pain 01/27/2019  .  Abnormal findings on diagnostic imaging of lung 01/27/2019  . Thrombocytopenia, unspecified ([place]) 01/26/2019  . COVID-19 virus infection 01/05/2019  . Cervical radiculopathy 03/15/2018  . Pneumonia 08/27/2017  . Hypokalemia 03/03/2017  . Cough   . Hyperbilirubinemia   . SIRS (systemic inflammatory response syndrome) ([place]) 02/16/2017  . Thrush 02/16/2017  . Chills with fever   . Right lower lobe pneumonia 02/09/2017  . Transaminitis 02/09/2017  . HCAP (healthcare-associated pneumonia) 05/11/2016  . Influenza B 05/11/2016  . Vomiting 05/11/2016  . Leukopenia 05/11/2016  . 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 Hypersensitivity reaction 04/18/2016  . Hemorrhoid 12/03/2015  . Headache 12/03/2015  . Dehydration 11/14/2015  . Fever 11/14/2015  . 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 Port catheter in place 05/30/2015  . Acute pyelonephritis   . UTI (urinary tract infection) 05/21/2015  . Sepsis ([place]) 05/20/2015  . Hepatitis 10/24/2014  . 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 GIB (gastrointestinal bleeding) 10/07/2014  . Healthcare-associated pneumonia 10/07/2014  . Constipation 09/17/2014  . Leukocytosis 09/14/2014  . Nausea & vomiting 09/14/2014  . Abdominal pain 09/14/2014  . Abnormal LFTs 09/14/2014  . Bilateral leg numbness 09/14/2014  . Normocytic anemia 09/14/2014  . GERD (gastroesophageal reflux disease)   . Chronic hepatitis B  without delta agent without hepatic coma ([place])   . Gastroesophageal reflux disease without esophagitis     Past Surgical History:  Procedure Laterality Date  . CYSTOSCOPY W/ URETERAL STENT PLACEMENT Right 09/25/2012   Procedure: CYSTOSCOPY WITH RETROGRADE PYELOGRAM/URETERAL STENT PLACEMENT;  Surgeon: Alexis Frock, MD;  Location: [facility];  Service: Urology;  Laterality: Right;  . CYSTOSCOPY WITH RETROGRADE PYELOGRAM, URETEROSCOPY AND STENT PLACEMENT Right 10/15/2012   Procedure: CYSTOSCOPY WITH RETROGRADE PYELOGRAM,  URETEROSCOPY AND REMOVAL STENT WITH  STENT PLACEMENT;  Surgeon: Alexis Frock, MD;  Location: [facility];  Service: Urology;  Laterality: Right;  . ESOPHAGOGASTRODUODENOSCOPY (EGD) WITH PROPOFOL N/A 11/16/2014   Procedure: ESOPHAGOGASTRODUODENOSCOPY (EGD) WITH PROPOFOL;  Surgeon: Milus Banister, MD;  Location: [facility];  Service: Endoscopy;  Laterality: N/A;  . HOLMIUM LASER APPLICATION Right 3/81/8299   Procedure: HOLMIUM LASER APPLICATION;  Surgeon: Alexis Frock, MD;  Location: [facility];  Service: Urology;  Laterality: Right;  . LIVER BIOPSY    . OTHER SURGICAL HISTORY Right    removal of ovarian cyst  . removal of uterine cyst     years ago  . RIGHT VATS W/ DRAINAGE PEURAL EFFUSION AND BX'S  10-30-2008     OB History   No obstetric history on file.     Family History  Problem Relation Age of Onset  . Stomach cancer Mother   . Lung disease Father   . Asthma Father     Social History   Tobacco Use  . Smoking status: Never Smoker  . Smokeless tobacco: Never Used  Substance Use Topics  . Alcohol use: No    Alcohol/week: 0.0 standard drinks  . Drug use: No    Home Medications Prior to Admission medications   Medication Sig Start Date End Date Taking? Authorizing Provider  acyclovir (ZOVIRAX) 800 MG tablet Take 1 tablet (800 mg total) by mouth 2 (two) times daily. 05/05/18   Truitt Merle, MD  aspirin 81 MG chewable tablet Chew 81 mg by mouth daily.    Truitt Merle, MD  dexamethasone (DECADRON) 2 MG tablet Take 2 tablets (4 mg total) by mouth 2 (two) times daily. 12/28/18   Ventura Sellers, MD  feeding supplement, ENSURE ENLIVE, (ENSURE ENLIVE) LIQD Take 237 mLs by mouth 2 (two) times daily between meals. 02/22/17   Elodia Florence., MD  ibuprofen (ADVIL,MOTRIN) 200 MG tablet Take 200 mg by mouth every 6 (six) hours as needed for moderate pain.    [provider]  morphine (MSIR) 15 MG tablet Take 1 tablet (15 mg total) by  mouth every 4 (four) hours as needed for severe pain. 01/29/19   Deno Etienne, DO  omeprazole (PRILOSEC) 20 MG capsule Take 1 capsule (20 mg total) by mouth daily. 07/06/18   Alla Feeling, NP  ondansetron (ZOFRAN ODT) 8 MG disintegrating tablet Take 1 tablet (8 mg total) by mouth every 8 (eight) hours as needed for nausea or vomiting. 01/13/19   Truitt Merle, MD  oxyCODONE (OXY IR/ROXICODONE) 5 MG immediate release tablet Take 1 tablet (5 mg total) by mouth every 8 (eight) hours as needed for severe pain. 01/27/19   [name], NP  polyethylene glycol (MIRALAX / GLYCOLAX) packet Take 17 g by mouth daily as needed (constipation). 08/31/17   Alla Feeling, NP  prochlorperazine (COMPAZINE) 10 MG tablet Take 1 tablet (10 mg total) by mouth every 6 (six) hours as needed for nausea or vomiting. 08/17/17   Kalman Shan,  Wilhemina Cash, NP  tenofovir (VIREAD) 300 MG tablet Take 1 tablet (300 mg total) by mouth daily. 01/26/19   Carlyle Basques, MD  traMADol (ULTRAM) 50 MG tablet Take 1 tablet (50 mg total) by mouth every 6 (six) hours as needed. 12/17/18   Truitt Merle, MD    Allergies    Patient has no known allergies.  Review of Systems   Review of Systems  Constitutional: Negative for chills and fever.  HENT: Negative for congestion and rhinorrhea.   Eyes: Negative for redness and visual disturbance.  Respiratory: Negative for shortness of breath and wheezing.   Cardiovascular: Negative for chest pain and palpitations.  Gastrointestinal: Negative for nausea and vomiting.  Genitourinary: Negative for dysuria and urgency.  Musculoskeletal: Positive for back pain and neck pain. Negative for arthralgias and myalgias.  Skin: Negative for pallor and wound.  Neurological: Negative for dizziness and headaches.    Physical Exam Updated Vital Signs BP (!) 173/111 (BP Location: Right Arm)   Pulse 78   Temp 97.7 F (36.5 C) (Oral)   Resp 18   Ht _0  (1.575 m)   Wt 66.7 kg   LMP 11/24/2014   SpO2 100%   BMI  26.89 kg/m   Physical Exam Vitals and nursing note reviewed.  Constitutional:      General: She is not in acute distress.    Appearance: She is well-developed. She is not diaphoretic.  HENT:     Head: Normocephalic and atraumatic.  Eyes:     Pupils: Pupils are equal, round, and reactive to light.  Cardiovascular:     Rate and Rhythm: Normal rate and regular rhythm.     Heart sounds: No murmur. No friction rub. No gallop.   Pulmonary:     Effort: Pulmonary effort is normal.     Breath sounds: No wheezing or rales.  Abdominal:     General: There is no distension.     Palpations: Abdomen is soft.     Tenderness: There is no abdominal tenderness.  Musculoskeletal:        General: Tenderness present.     Cervical back: Normal range of motion and neck supple.     Comments: Pain is worse to the paraspinal musculature on the left.  She has some pain about the trapezius muscle belly.  Pulse motor and sensation are intact to bilateral upper extremities.  5 out of 5 muscle strength.  Reflexes are 2+ and equal.  Skin:    General: Skin is warm and dry.  Neurological:     Mental Status: She is alert and oriented to person, place, and time.  Psychiatric:        Behavior: Behavior normal.     ED Results / Procedures / Treatments   Labs (all labs ordered are listed, but only abnormal results are displayed) Labs Reviewed  CBC WITH DIFFERENTIAL/PLATELET - Abnormal; Notable for the following components:      Result Value   RBC 3.45 (*)    Hemoglobin 10.6 (*)    HCT 33.2 (*)    All other components within normal limits  BASIC METABOLIC PANEL - Abnormal; Notable for the following components:   Glucose, Bld 100 (*)    All other components within normal limits  RESPIRATORY PANEL BY RT PCR (FLU A&B, COVID)    EKG None  Radiology MR Cervical Spine W or Wo Contrast  Result Date: 01/29/2019 CLINICAL DATA:  48 year old female with plasma cell leukemia/multiple myeloma. 3 months of left  side shoulder pain. Pain now extending to the right shoulder. Status post bone marrow transplant at [facility]. Status post COVID-19 1 month ago. EXAM: MRI CERVICAL SPINE WITHOUT AND WITH CONTRAST TECHNIQUE: Multiplanar and multiecho pulse sequences of the cervical spine, to include the craniocervical junction and cervicothoracic junction, were obtained without and with intravenous contrast. CONTRAST:  44m GADAVIST GADOBUTROL 1 MMOL/ML IV SOLN COMPARISON:  Cervical spine MRI 03/09/2018 FINDINGS: Alignment: Straightening of cervical lordosis, slightly less reversal of lordosis compared to February. Vertebrae: Visible bone marrow signal including throughout the cervical spine is within normal limits. There is a stable densely sclerotic area in the left C2 vertebral body which appears unchanged. No marrow edema or evidence of acute osseous abnormality. Cord: Compressed cervical spinal cord at the C4 level by an extra medullary and possibly extradural or dural based homogeneously enhancing mass which is inseparable from or growing out through the left C5 neural foramen. This encompasses 8 by 22 x 22 millimeters within the spinal canal and through the foramen, although the mass continues out along the course of the left C5 nerve another 2 centimeters or so from the spine (series 5, image 32). Along the most superior extent of the mass axial image series 10, image 22 suggests an intradural position. This was not apparent in February. Despite the severe spinal cord mass effect and compression (series 10, image 26), there is no cord edema evident. There is no other abnormal dural or intradural enhancement. Marrow signal within the adjacent C4 vertebra remains normal, with no bone involvement evident. Above and below the C4 vertebral level spinal cord signal and morphology remains normal. Posterior Fossa, vertebral arteries, paraspinal tissues: Cervicomedullary junction is within normal limits. Negative visible  posterior fossa, with no abnormal enhancement identified. The left vertebral artery appears deviated by the left C5 foraminal mass on series 5, image 28 but the major vascular flow voids in the neck are preserved. The other visible bilateral neck soft tissues appear stable since February, including chronically asymmetric but not definitely pathologic left level 2 cervical lymph nodes (up to 8 millimeter short axis on that size and 4 millimeters or smaller on the right. I also note prominence of the palatine tonsils, but their enhancement pattern seems to remain normal. Disc levels: No significant superimposed cervical spine degeneration. The small midline C4-C5 disc protrusion described in February is less apparent. IMPRESSION: 1. 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 Although the lesion tracks out of the left C5 neural foramen and continues along the course of the left C5 nerve, this tumor growth rate argues against a schwannoma, or a meningioma. 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 However, the C4 vertebra is unaffected and other visible osseous structures appear stable and normal. 2. Associated moderate to severe C4 level spinal cord compression, although no cord edema is evident. 3. Elsewhere the neuro axis visible on these images remains normal. 4. Mild asymmetry of the left level 2 cervical lymph nodes is again noted, but stable and indeterminate. Study discussed by telephone with Dr. DLinna HoffFLOYD on 01/29/2019 at 11:08 . Electronically Signed   By: HGenevie AnnM.D.   On: 01/29/2019 11:15    Procedures Procedures (including critical care time)  Medications Ordered in ED Medications  oxyCODONE (Oxy IR/ROXICODONE) immediate release tablet 5 mg (has no administration in time range)  acetaminophen (TYLENOL) tablet 1,000 mg (1,000  mg Oral Given 01/29/19 0832)  ketorolac (TORADOL) injection 15 mg (15 mg Intramuscular Given 01/29/19 0833)  oxyCODONE (Oxy IR/ROXICODONE) immediate release tablet 5 mg (5 mg Oral Given 01/29/19 0832)  diazepam (VALIUM) tablet 5 mg (5 mg Oral Given 01/29/19 0832)  gadobutrol (GADAVIST) 1 MMOL/ML injection 7 mL (7 mLs Intravenous Contrast Given 01/29/19 1019)    ED Course  I have reviewed the triage vital signs and the nursing notes.  Pertinent labs & imaging results that were available during my care of the patient were reviewed by me and considered in my medical decision making (see chart for details).    MDM Rules/Calculators/A&P                      48  Yo F with a chief complaint of chronic neck pain.  Patient has recently seen a pain management physician she does not feel that that is helped.  She has had an MRI ordered as an outpatient but has not yet been completed.  Since the patient has now been seen 3-4 times in the ED and has been referred to an MRI at least twice by her oncologist will obtain an MRI here for further evaluation.  MRI concerning for mass along the C4 level, extends into the C5 nerve root.  I discussed this with the neurosurgeon, Dr. Arnoldo Morale.  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  I discussed case with Dr. Marin Olp, oncology he was concerned that we do not know yet the etiology of the mass.  He felt that as the patient had been seen and treated at [facility] for this then had a bone marrow transplant that perhaps she should be discussed with oncology at [facility] as the patient likely needs an open biopsy.  I discussed the case with the oncologist at [facility], Dr. Linus Orn.  He felt that if the patient was clinically stable then he felt that it could be appropriate to have the patient seen in the office on Monday or Tuesday to schedule a biopsy.  I  discussed this with the patient and she would prefer not to be in the hospital.  She is requesting a prescription for pain medicine.  She understands to return for any worsening or numbness or weakness to the extremity. 1:08 PM:  I have discussed the diagnosis/risks/treatment options with the patient and family and believe the pt to be eligible for discharge home to follow-up with Oncology at [facility]. We also discussed returning to the ED immediately if new or worsening sx occur. We discussed the sx which are most concerning (e.g., sudden worsening pain, fever, inability to tolerate by mouth) that necessitate immediate return. Medications administered to the patient during their visit and any new prescriptions provided to the patient are listed below.  Medications given during this visit Medications  oxyCODONE (Oxy IR/ROXICODONE) immediate release tablet 5 mg (has no administration in time range)  acetaminophen (TYLENOL) tablet 1,000 mg (1,000 mg Oral Given 01/29/19 0832)  ketorolac (TORADOL) injection 15 mg (15 mg Intramuscular Given 01/29/19 0833)  oxyCODONE (Oxy IR/ROXICODONE) immediate release tablet 5 mg (5 mg Oral Given 01/29/19 0832)  diazepam (VALIUM) tablet 5 mg (5 mg Oral Given 01/29/19 0832)  gadobutrol (GADAVIST) 1 MMOL/ML injection 7 mL (7 mLs Intravenous Contrast Given 01/29/19 1019)  The patient appears reasonably screen and/or stabilized for discharge and I doubt any other medical condition or other [facility] requiring further screening, evaluation, or treatment in the ED at this time prior to discharge.   Final Clinical Impression(s) / ED Diagnoses Final diagnoses:  Neck pain    Rx / DC Orders ED Discharge Orders         Ordered    morphine (MSIR) 15 MG tablet  Every 4 hours PRN     01/29/19 1303           Deno Etienne, DO 01/29/19 [place], [place], DO 01/29/19 1310

## 2019-01-29 NOTE — ED Triage Notes (Signed)
Pt has had left sided shoulder pain x 3 months . Pt was seen here 1 month ago for the same, but was unable to follow up outpatient, as she was dx with COVID 1 month ago. Pt states that the pain is now spreading to the right shoulder.

## 2019-01-29 NOTE — Consult Note (Signed)
I was contacted by Dr. Tyrone Nine regarding this patient.   The patient is a 48 year old Asian female with a history of plasma cell leukemia. She has complained of neck and left arm pain for almost a year.  She had a MRI on 03/09/2018 which, in retrospect, demonstrated a lesion in the left C4-5 foramen.   By report, she has continued to complain of left arm pain.  She was in the process of getting an outpatient cervical MRI.  She presented to the ER complaining of left arm pain but continues to have fairly normal strength by report.  A repeat cervical MRI demonstrates an enhancing left intraspinal extra-axial lesion consistent with tumor.  I have discussed this with Dr. Tyrone Nine.  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  I recommend he contact the patient's oncologist and they consider radiation/chemotherapy.  Otherwise she will likely need surgery.

## 2019-01-29 NOTE — ED Notes (Signed)
Pt ambulatory to bathroom, no assistance needed.  

## 2019-01-29 NOTE — ED Notes (Signed)
Pt is in MRI, vitals will be done when the pt return

## 2019-01-31 MED FILL — MORPHINE SULFATE 15 MG TABS: 15 | 1 days supply | Qty: 7 | Fill #0

## 2019-02-01 ENCOUNTER — Inpatient Hospital Stay: Payer: Medicaid Other | Attending: Hematology

## 2019-02-01 ENCOUNTER — Inpatient Hospital Stay: Payer: Medicaid Other

## 2019-02-01 ENCOUNTER — Ambulatory Visit: Payer: Medicaid Other | Admitting: Hematology

## 2019-02-01 LAB — COMPLETE METABOLIC PANEL WITH GFR
AG Ratio: 1.2 (calc) (ref 1.0–2.5)
ALT: 3 U/L — ABNORMAL LOW (ref 6–29)
AST: 19 U/L (ref 10–35)
Albumin: 3.6 g/dL (ref 3.6–5.1)
Alkaline phosphatase (APISO): 76 U/L (ref 37–153)
BUN: 13 mg/dL (ref 7–25)
CO2: 31 mmol/L (ref 20–32)
Calcium: 9.3 mg/dL (ref 8.6–10.4)
Chloride: 103 mmol/L (ref 98–110)
Creat: 0.87 mg/dL (ref 0.50–1.05)
GFR, Est African American: 90 mL/min/{1.73_m2} (ref >=60)
GFR, Est Non African American: 78 mL/min/{1.73_m2} (ref >=60)
Globulin: 2.9 g/dL (calc) (ref 1.9–3.7)
Glucose, Bld: 112 mg/dL — ABNORMAL HIGH (ref 65–99)
Potassium: 3.6 mmol/L (ref 3.5–5.3)
Sodium: 142 mmol/L (ref 135–146)
Total Bilirubin: 0.3 mg/dL (ref 0.2–1.2)
Total Protein: 6.5 g/dL (ref 6.1–8.1)

## 2019-02-01 LAB — HEPATITIS B DNA, ULTRAQUANTITATIVE, PCR
Hepatitis B DNA (Calc): 1.9 Log IU/mL — ABNORMAL HIGH
Hepatitis B DNA: 80 IU/mL — ABNORMAL HIGH

## 2019-02-01 LAB — HEPATITIS B E ANTIGEN: Hep B E Ag: NONREACTIVE

## 2019-02-01 LAB — HEPATITIS B E ANTIBODY: Hep B E Ab: NONREACTIVE

## 2019-02-03 NOTE — Progress Notes (Signed)
PCCM: I agree. I would hold off on bronchoscopy at this point. The CT with GGO could all be related to post covid and it sounds like her cough has improved.  Garner Nash, DO [facility] 02/03/2019 7:27 AM

## 2019-02-11 MED FILL — DEXAMETHASONE 4 MG TABLET: 4 | 10 days supply | Qty: 40 | Fill #0

## 2019-02-14 MED FILL — OMEPRAZOLE 20 MG CAP: 20 | 90 days supply | Qty: 90 | Fill #0

## 2019-02-14 MED FILL — POLYETHYLENE GLYCOL 3350 PO: 17 | 28 days supply | Qty: 476 | Fill #0

## 2019-02-15 ENCOUNTER — Ambulatory Visit (HOSPITAL_COMMUNITY)
Admission: RE | Admit: 2019-02-15 | Discharge: 2019-02-15 | Disposition: A | Discharge status: Home / Self Care | Payer: Medicaid Other | Source: Ambulatory Visit | Attending: Internal Medicine | Admitting: Internal Medicine

## 2019-02-15 ENCOUNTER — Other Ambulatory Visit: Payer: Self-pay

## 2019-02-15 DIAGNOSIS — B181 Chronic viral hepatitis B without delta-agent: Secondary | ICD-10-CM | POA: Diagnosis present

## 2019-02-21 MED FILL — TENOFOVIR DISOPROXIL FUMARA: 300 | 30 days supply | Qty: 30 | Fill #1

## 2019-02-22 MED FILL — LACTULOSE 10 GM/15 ML SOLUT: 10 | 1 days supply | Qty: 240 | Fill #0

## 2019-02-28 ENCOUNTER — Telehealth: Payer: Self-pay

## 2019-02-28 ENCOUNTER — Other Ambulatory Visit: Payer: Self-pay

## 2019-02-28 NOTE — Telephone Encounter (Signed)
Ms Sheryl [name] called for her mother Sheryl [name]. She states her mother is having increased pain in her left arm and neck and that she is weak.  She called [facility] oncology as this is where she has been most recently treated.  Dr. Hervey Ard office told her to call Dr. Burr Medico to see if she could see her this week.  Do you want me to have an appointment made for her?

## 2019-02-28 NOTE — Telephone Encounter (Signed)
I called her back, and will see her at 8:20am tomorrow.   Truitt Merle MD

## 2019-03-01 ENCOUNTER — Other Ambulatory Visit: Payer: Self-pay

## 2019-03-01 ENCOUNTER — Ambulatory Visit (HOSPITAL_COMMUNITY)
Admission: RE | Admit: 2019-03-01 | Discharge: 2019-03-01 | Disposition: A | Discharge status: Home / Self Care | Payer: Medicaid Other | Source: Ambulatory Visit | Attending: Hematology | Admitting: Hematology

## 2019-03-01 ENCOUNTER — Encounter: Payer: Self-pay | Admitting: Hematology

## 2019-03-01 ENCOUNTER — Other Ambulatory Visit: Payer: Self-pay | Admitting: Emergency Medicine

## 2019-03-01 ENCOUNTER — Inpatient Hospital Stay: Payer: Medicaid Other | Attending: Hematology | Admitting: Hematology

## 2019-03-01 ENCOUNTER — Inpatient Hospital Stay (HOSPITAL_BASED_OUTPATIENT_CLINIC_OR_DEPARTMENT_OTHER): Payer: Medicaid Other

## 2019-03-01 ENCOUNTER — Inpatient Hospital Stay: Payer: Medicaid Other

## 2019-03-01 VITALS — BP 116/79 | HR 113 | Temp 98.9°F | Resp 18

## 2019-03-01 VITALS — BP 135/102 | HR 123 | Temp 97.0°F | Resp 17 | Ht 62.0 in | Wt 148.2 lb

## 2019-03-01 DIAGNOSIS — Z95828 Presence of other vascular implants and grafts: Secondary | ICD-10-CM

## 2019-03-01 DIAGNOSIS — C9012 Plasma cell leukemia in relapse: Secondary | ICD-10-CM

## 2019-03-01 DIAGNOSIS — K219 Gastro-esophageal reflux disease without esophagitis: Secondary | ICD-10-CM | POA: Diagnosis not present

## 2019-03-01 DIAGNOSIS — C9030 Solitary plasmacytoma not having achieved remission: Secondary | ICD-10-CM | POA: Diagnosis not present

## 2019-03-01 DIAGNOSIS — Z923 Personal history of irradiation: Secondary | ICD-10-CM | POA: Diagnosis not present

## 2019-03-01 DIAGNOSIS — R5383 Other fatigue: Secondary | ICD-10-CM | POA: Diagnosis not present

## 2019-03-01 DIAGNOSIS — R630 Anorexia: Secondary | ICD-10-CM | POA: Insufficient documentation

## 2019-03-01 DIAGNOSIS — R112 Nausea with vomiting, unspecified: Secondary | ICD-10-CM

## 2019-03-01 DIAGNOSIS — C9011 Plasma cell leukemia in remission: Secondary | ICD-10-CM

## 2019-03-01 DIAGNOSIS — Z9221 Personal history of antineoplastic chemotherapy: Secondary | ICD-10-CM | POA: Insufficient documentation

## 2019-03-01 DIAGNOSIS — R61 Generalized hyperhidrosis: Secondary | ICD-10-CM | POA: Insufficient documentation

## 2019-03-01 DIAGNOSIS — B370 Candidal stomatitis: Secondary | ICD-10-CM | POA: Insufficient documentation

## 2019-03-01 DIAGNOSIS — E860 Dehydration: Secondary | ICD-10-CM

## 2019-03-01 DIAGNOSIS — R110 Nausea: Secondary | ICD-10-CM | POA: Diagnosis present

## 2019-03-01 DIAGNOSIS — J189 Pneumonia, unspecified organism: Secondary | ICD-10-CM | POA: Insufficient documentation

## 2019-03-01 DIAGNOSIS — B181 Chronic viral hepatitis B without delta-agent: Secondary | ICD-10-CM | POA: Diagnosis not present

## 2019-03-01 LAB — CBC WITH DIFFERENTIAL (CANCER CENTER ONLY)
Abs Immature Granulocytes: 0.29 10*3/uL — ABNORMAL HIGH (ref 0.00–0.07)
Band Neutrophils: 0 %
Basophils Absolute: 0 10*3/uL (ref 0.0–0.1)
Basophils Relative: 0 %
Blasts: 0 %
Eosinophils Absolute: 0 10*3/uL (ref 0.0–0.5)
Eosinophils Relative: 0 %
HCT: 38.1 % (ref 36.0–46.0)
Hemoglobin: 12.2 g/dL (ref 12.0–15.0)
Lymphocytes Relative: 25 %
Lymphs Abs: 1.8 10*3/uL (ref 0.7–4.0)
MCH: 30.7 pg (ref 26.0–34.0)
MCHC: 32 g/dL (ref 30.0–36.0)
MCV: 95.7 fL (ref 80.0–100.0)
Metamyelocytes Relative: 2 %
Monocytes Absolute: 0.6 10*3/uL (ref 0.1–1.0)
Monocytes Relative: 9 %
Myelocytes: 2 %
Neutro Abs: 4.5 10*3/uL (ref 1.7–7.7)
Neutrophils Relative %: 62 %
Other: 0 %
Platelet Count: 147 10*3/uL — ABNORMAL LOW (ref 150–400)
Promyelocytes Relative: 0 %
RBC: 3.98 MIL/uL (ref 3.87–5.11)
RDW: 14.6 % (ref 11.5–15.5)
WBC Count: 7.2 10*3/uL (ref 4.0–10.5)
nRBC: 0 % (ref 0.0–0.2)
nRBC: 0 /100 WBC

## 2019-03-01 LAB — URINALYSIS, COMPLETE (UACMP) WITH MICROSCOPIC
Bacteria, UA: NONE SEEN
Bilirubin Urine: NEGATIVE
Glucose, UA: NEGATIVE mg/dL
Hgb urine dipstick: NEGATIVE
Ketones, ur: NEGATIVE mg/dL
Nitrite: NEGATIVE
Protein, ur: NEGATIVE mg/dL
Specific Gravity, Urine: 1.016 (ref 1.005–1.030)
pH: 7 (ref 5.0–8.0)

## 2019-03-01 LAB — CMP (CANCER CENTER ONLY)
ALT: 12 U/L (ref 0–44)
AST: 20 U/L (ref 15–41)
Albumin: 3 g/dL — ABNORMAL LOW (ref 3.5–5.0)
Alkaline Phosphatase: 76 U/L (ref 38–126)
Anion gap: 12 (ref 5–15)
BUN: 14 mg/dL (ref 6–20)
CO2: 22 mmol/L (ref 22–32)
Calcium: 8.7 mg/dL — ABNORMAL LOW (ref 8.9–10.3)
Chloride: 102 mmol/L (ref 98–111)
Creatinine: 0.84 mg/dL (ref 0.44–1.00)
GFR, Est AFR Am: >60 mL/min (ref >60)
GFR, Estimated: >60 mL/min (ref >60)
Glucose, Bld: 148 mg/dL — ABNORMAL HIGH (ref 70–99)
Potassium: 3.7 mmol/L (ref 3.5–5.1)
Sodium: 136 mmol/L (ref 135–145)
Total Bilirubin: 0.5 mg/dL (ref 0.3–1.2)
Total Protein: 7 g/dL (ref 6.5–8.1)

## 2019-03-01 LAB — SAMPLE TO BLOOD BANK

## 2019-03-01 LAB — PREGNANCY, URINE: Preg Test, Ur: NEGATIVE

## 2019-03-01 MED ORDER — ONDANSETRON HCL 4 MG/2ML IJ SOLN
INTRAMUSCULAR | Status: AC
  Filled 2019-03-01: qty 4

## 2019-03-01 MED ORDER — SODIUM CHLORIDE 0.9 % IV SOLN
Freq: Once | INTRAVENOUS | Status: AC
  Filled 2019-03-01: qty 250

## 2019-03-01 MED ORDER — SODIUM CHLORIDE 0.9% FLUSH
10.0000 mL | Freq: Once | INTRAVENOUS | Status: AC
  Administered 2019-03-01: 11:00:00 10 mL
  Filled 2019-03-01: qty 10

## 2019-03-01 MED ORDER — ONDANSETRON HCL 4 MG/2ML IJ SOLN
8.0000 mg | Freq: Once | INTRAMUSCULAR | Status: AC
  Administered 2019-03-01: 8 mg via INTRAVENOUS

## 2019-03-01 MED ORDER — SODIUM CHLORIDE 0.9 % IV SOLN: Freq: Once | INTRAVENOUS | Status: DC

## 2019-03-01 MED ORDER — DEXAMETHASONE SODIUM PHOSPHATE 10 MG/ML IJ SOLN
10.0000 mg | Freq: Once | INTRAMUSCULAR | Status: AC
  Administered 2019-03-01: 09:00:00 10 mg via INTRAVENOUS

## 2019-03-01 MED ORDER — HEPARIN SOD (PORK) LOCK FLUSH 100 UNIT/ML IV SOLN
500.0000 [IU] | Freq: Once | INTRAVENOUS | Status: AC
  Administered 2019-03-01: 11:00:00 500 [IU]
  Filled 2019-03-01: qty 5

## 2019-03-01 MED ORDER — DEXAMETHASONE SODIUM PHOSPHATE 10 MG/ML IJ SOLN
INTRAMUSCULAR | Status: AC
  Filled 2019-03-01: qty 1

## 2019-03-01 MED ORDER — NYSTATIN 100000 UNIT/ML MT SUSP: 5.0000 mL | Freq: Four times a day (QID) | OROMUCOSAL | 0 refills | Status: DC

## 2019-03-01 MED FILL — NYSTATIN 100,000 UNITS/ML S: 100000 | 23 days supply | Qty: 473 | Fill #0

## 2019-03-01 NOTE — Progress Notes (Signed)
Per MD Burr Medico draw only CBC, CMP, Sample blood bank, and one set of blood cultures from pt's port (d/t limited venous access only one set drawn, MD Burr Medico aware) before any IV abx ordered or started.  No other labs required at this time, KL and MM standing labs cancelled, lab aware.  1120- Pt left restroom after giving urine sample & having bowel movement.  Pt sweaty and reports weakness/dizziness.  Pt placed in flush room chair.  VS taken.  MD Burr Medico alerted and assessed pt.  Pt has no orthostatic hypotension when standing up for VS.  Pt reports feeling better after sitting for a few minutes.  Per MD Burr Medico pt likely had vagal response and is cleared to continue to CXR appt as originally planned.  Port left deaccessed.  Daughter present to translate per pt preference, both verbalized understanding of instructions and to f/u as needed.

## 2019-03-01 NOTE — Progress Notes (Signed)
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   10/10/2014 Miscellaneous   Peripheral blood chemistry and leukocytosis with total white count 78K, comprised of large plasma cells and his normocytic anemia. There is a myeloid left shift with previous surgical radium blasts. Flow cytometry showed 64% plasma cells   10/10/2014 Bone Marrow Biopsy   Markedly hypercellular marrow (95%), Atypical plasma cells comprise 57% of the cellularity. There was diminished multilineage in hematopoiesis with adequate maturation. Breasts less than 1%), no overt dysplasia of the myeloid or erythroid lineages.    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   05/07/2015 - 05/12/2015 Hospital Admission   [facility], [facility]. ID  workup was negative, EGD showed evidence of gastritis and duodenitis, no H. pylori or CMV.   05/20/2015 - 05/24/2015 Hospital Admission   [facility]   07/11/2015 Bone Marrow Biopsy   Post transplant 100 a bone marrow biopsy showed hypocellular marrow, 20%, no increase in plasma cells (2%) or other abnormalities.   08/22/2015 - 01/02/2016 Chemotherapy   MaintenanceCyborD (cytoxan 3043mm2 iv, bortezomib 1.5 mg/m, dexamethasone 40 mg, every 2 weeks, changed to Velcade maintenance after 4 months treatment   01/16/2016 - 04/19/2016 Chemotherapy   Maintenance Velcade 1.3 mg/m every 2 weeks   02/22/2016 Pathology Results   BONE MARROW: Diagnosis Bone Marrow, Aspirate,Biopsy, and Clot, right iliac - NORMOCELLULAR BONE MARROW FOR AGE WITH TRILINEAGE HEMATOPOIESIS. - PLASMACYTOSIS (PLASMA CELLS 12%). - SEE COMMENT. PERIPHERAL BLOOD: - OCCASIONAL CIRCULATING PLASMA CELLS.   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    04/18/2016 - 03/26/2017 Chemotherapy   Daratumumab per protocol  CyBorD every week, cytoxan was held after 04/24/2016 dye to cytopenia and infection  -discontinued due to Hep B flare   05/11/2016 - 05/16/2016 Hospital Admission   Healthcare-associated pneumonia   02/16/2017 - 02/21/2017 Hospital Admission   Admission diagnosis: Abnormal LFT's Additional comments: Assoc diagnoses: Hep B flare, transminitis, dehydration, fever, SIRS   04/06/2017 - 11/22/2018 Chemotherapy   Carfilzomib 56 mg/m2 on days 1 and 15 (except 2062m2 on C1D1 and C1D2) with dexamethasone 20 mg on same days and pomalidomide 2 mg on days 1-21 of 28 day cycle on 04/13/17, dexa stopped on 07/06/2017 due to her CR  -She restarted her Pomalyst in 03/2018 (off for 2-3 months due to insurance, delivery issues),  held since 11/22/18  due to pulmonary issues.    08/26/2017 - 08/28/2017 Hospital Admission   Admit  date: 08/26/2017 Admission diagnosis: RLL Pneumonia    04/19/2018 Bone Marrow Biopsy   Bone Marrow Biopsy 04/19/18 at [facility] Bone Marrow (BM) and Peripheral Blood (PB) FINAL PATHOLOGIC DIAGNOSIS BONE MARROW: Hypocellular marrow (20%) with trilineage hematopoiesis. No [plasma cell myeloma identified. See comment. PERIPHERAL BLOOD: Mild anemia. No circulating plasma cells seen. See CBC data and comment.    01/29/2019 Imaging   MRI of cervical spine: IMPRESSION: 1. 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   Although the lesion tracks out of the left C5 neural foramen and continues along the course of the left C5 nerve, this tumor growth rate argues against a schwannoma, or a meningioma.   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 However, the C4 vertebra is unaffected and other visible osseous structures appear stable and normal.   2. Associated moderate to severe C4 level spinal cord compression, although no cord edema is evident.   3. Elsewhere the neuro axis visible on these images remains normal.   4. Mild asymmetry of the left level 2 cervical lymph nodes is again noted, but stable and indeterminate.   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 No suspicious hypermetabolic tissue in neck.. 2. Focal activity in the proximal esophagus is favored related to radiation treatment. 3. No focal hypermetabolic activity in the skeleton. No plasmacytoma.   02/22/2019 Bone Marrow Biopsy   Evaluation is limited due to aspicular aspirate smears. The bone marrow shows adequate trilineage hematopoiesis without significant dysplasia or increased blasts. Plasma cells represent about 3% of cell on  aspirate smears, with low-grade morphology (BARTL I). Appropriately controlled immunohistochemical stains are performed on the core biopsy. CD138 highlights interstitial and focal aggregates of plasma cells comprising approximately 5% of total cells. These plasma cells are lambda light chain restricted, as demonstrated by kappa and lambda in-situ hybridization.  The overall findings are consistent with involvement by the previously diagnosed lambda light chain restricted plasma cell neoplasm.      CURRENT THERAPY:  Pending    INTERVAL HISTORY:  Niemah Arrambide is here for a follow up with her daughter. She unfortunately was found to have a 2.2 cm C4 homogenously enhancing intraspinal mass concerning for plasmacytoma with cord compression. , for which she received palliative [facility].  She was put on dexamethasone, which was tapering off on 1/24 (she was on 69m daily for the week before she stopped).  [facility]  She has not started the systemic therapy yet.  Her neck and left shoulder pain did improve after radiation, however she reports progressive weakness and intermittent nausea vomiting since 1/23. She also had 1 episode of fever at home 2 days ago.  Due to her profound fatigue, she has not been able to do much at home.  Her daughter called yesterday, and she was scheduled to see me this morning.  Her appetite is poor, she has mild sore throat and dry cough lately, no chest pain or dyspnea.  He also reports constant epigastric discomfort and abdominal bloating, she has been slightly constipated, last bowel movement was 2 3 days ago.  The rest of  review of system was negative.   MEDICAL HISTORY:  Past Medical History:  Diagnosis Date  . Chills with fever    intermittently since d/c from hospital  . Dysuria-frequency syndrome    w/ pink urine  . GERD (gastroesophageal reflux disease)   . Hepatitis   . History of positive PPD    DX  2011--  CXR DONE NO EVIDENCE  . History of ureter stent   . Hydronephrosis, right   . Neuromuscular disorder (HCC)    legs numb intermittently  . 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 Pneumonia   . Right ureteral stone   . Urosepsis 8/14   admitted to [facility]    SURGICAL HISTORY: Past Surgical History:  Procedure Laterality Date  . CYSTOSCOPY W/ URETERAL STENT PLACEMENT Right 09/25/2012   Procedure: CYSTOSCOPY WITH RETROGRADE PYELOGRAM/URETERAL STENT PLACEMENT;  Surgeon: Alexis Frock, MD;  Location: [facility];  Service: Urology;  Laterality: Right;  . CYSTOSCOPY WITH RETROGRADE PYELOGRAM, URETEROSCOPY AND STENT PLACEMENT Right 10/15/2012   Procedure: CYSTOSCOPY WITH RETROGRADE PYELOGRAM, URETEROSCOPY AND REMOVAL STENT WITH  STENT PLACEMENT;  Surgeon: Alexis Frock, MD;  Location: [facility];  Service: Urology;  Laterality: Right;  . ESOPHAGOGASTRODUODENOSCOPY (EGD) WITH PROPOFOL N/A 11/16/2014   Procedure: ESOPHAGOGASTRODUODENOSCOPY (EGD) WITH PROPOFOL;  Surgeon: Milus Banister, MD;  Location: [facility];  Service: Endoscopy;  Laterality: N/A;  . HOLMIUM LASER APPLICATION Right 6/54/6503   Procedure: HOLMIUM LASER APPLICATION;  Surgeon: Alexis Frock, MD;  Location: [facility];  Service: Urology;  Laterality: Right;  . LIVER BIOPSY    . OTHER SURGICAL HISTORY Right    removal of ovarian cyst  . removal of uterine cyst     years ago  . RIGHT VATS W/ DRAINAGE PEURAL EFFUSION AND BX'S  10-30-2008    I have reviewed the social history and family history with the patient and they are unchanged from previous note.  ALLERGIES:  has No Known Allergies.  MEDICATIONS:  Current Outpatient Medications  Medication Sig Dispense Refill  . lactulose (CHRONULAC) 10 GM/15ML solution Take 15 mLs by mouth every 3 (three) hours as needed.    Marland Kitchen acyclovir (ZOVIRAX) 800 MG tablet Take 1 tablet (800 mg total) by mouth 2 (two) times daily. 60 tablet 3  . amLODipine (NORVASC) 5  MG tablet Take 5 mg by mouth daily.    Marland Kitchen aspirin 81 MG chewable tablet Chew 81 mg by mouth daily.    Marland Kitchen dexamethasone (DECADRON) 2 MG tablet Take 2 tablets (4 mg total) by mouth 2 (two) times daily. 30 tablet 0  . feeding supplement, ENSURE ENLIVE, (ENSURE ENLIVE) LIQD Take 237 mLs by mouth 2 (two) times daily between meals. 237 mL 12  . gabapentin (NEURONTIN) 300 MG capsule Take 300 capsules by mouth as directed. Take 300 mg by mouth in morning and afternoon and 684m by mouth in the evening    . ibuprofen (ADVIL,MOTRIN) 200 MG tablet Take 200 mg by mouth every 6 (six) hours as needed for moderate pain.    .Marland Kitchenmorphine (MSIR) 15 MG tablet Take 1 tablet (15 mg total) by mouth every 4 (four) hours as needed for severe pain. 7 tablet 0  . nystatin (MYCOSTATIN) 100000 UNIT/ML suspension Take 5 mLs (500,000 Units total) by mouth 4 (four) times daily. 473 mL 0  . omeprazole (PRILOSEC) 20 MG capsule Take 1 capsule (20 mg total) by mouth daily. 30 capsule 5  . ondansetron (ZOFRAN ODT) 8 MG disintegrating tablet Take  1 tablet (8 mg total) by mouth every 8 (eight) hours as needed for nausea or vomiting. 40 tablet 1  . oxyCODONE (OXY IR/ROXICODONE) 5 MG immediate release tablet Take 1 tablet (5 mg total) by mouth every 8 (eight) hours as needed for severe pain. 20 tablet 0  . polyethylene glycol (MIRALAX / GLYCOLAX) packet Take 17 g by mouth daily as needed (constipation). 14 each 1  . prochlorperazine (COMPAZINE) 10 MG tablet Take 1 tablet (10 mg total) by mouth every 6 (six) hours as needed for nausea or vomiting. 30 tablet 3  . senna-docusate (SENOKOT-S) 8.6-50 MG tablet Take 2 tablets by mouth 2 (two) times daily.    Marland Kitchen tenofovir (VIREAD) 300 MG tablet Take 1 tablet (300 mg total) by mouth daily. 30 tablet 11  . traMADol (ULTRAM) 50 MG tablet Take 1 tablet (50 mg total) by mouth every 6 (six) hours as needed. 30 tablet 0   No current facility-administered medications for this visit.   Facility-Administered  Medications Ordered in Other Visits  Medication Dose Route Frequency Provider Last Rate Last Admin  . sodium chloride 0.9 % injection 10 mL  10 mL Intravenous PRN Truitt Merle, MD   10 mL at 12/11/15 1532  . sodium chloride 0.9 % injection 10 mL  10 mL Intravenous PRN Truitt Merle, MD   10 mL at 06/11/16 1505  . sodium chloride 0.9 % injection 10 mL  10 mL Intravenous PRN Truitt Merle, MD   10 mL at 08/04/17 1643  . sodium chloride flush (NS) 0.9 % injection 10 mL  10 mL Intravenous PRN Truitt Merle, MD   10 mL at 10/09/15 1717  . sodium chloride flush (NS) 0.9 % injection 10 mL  10 mL Intracatheter PRN Truitt Merle, MD   10 mL at 09/27/18 1228    PHYSICAL EXAMINATION: ECOG PERFORMANCE STATUS: 1 - Symptomatic but completely ambulatory  Vitals:   03/01/19 0853  BP: (!) 135/102  Pulse: (!) 123  Resp: 17  Temp: (!) 97 F (36.1 C)  SpO2: 98%   Filed Weights   03/01/19 0853  Weight: 148 lb 3.2 oz (67.2 kg)    GENERAL:alert, slightly distressed due to abdominal discomfort and fatigue SKIN: skin color, texture, turgor are normal, no rashes or significant lesions ORAL: (+) thrush on her tongue  NECK: supple, thyroid normal size, non-tender, without nodularity LYMPH:  no palpable lymphadenopathy in the cervical, axillary  LUNGS: clear to auscultation and percussion with normal breathing effort HEART: regular rate & rhythm and no murmurs and no lower extremity edema ABDOMEN:abdomen soft, mild tenderness in the epigastric area, and normal bowel sounds Musculoskeletal:no cyanosis of digits and no clubbing  NEURO: alert & oriented x 3 with fluent speech, no focal motor/sensory deficits  LABORATORY DATA:  I have reviewed the data as listed CBC Latest Ref Rng & Units 03/01/2019 01/29/2019 01/01/2019  WBC 4.0 - 10.5 K/uL 7.2 4.5 7.5  Hemoglobin 12.0 - 15.0 g/dL 12.2 10.6(L) 12.4  Hematocrit 36.0 - 46.0 % 38.1 33.2(L) 40.2  Platelets 150 - 400 K/uL 147(L) 195 217     CMP Latest Ref Rng & Units  03/01/2019 01/29/2019 01/26/2019  Glucose 70 - 99 mg/dL 148(H) 100(H) 112(H)  BUN 6 - 20 mg/dL '14 13 13  ' Creatinine 0.44 - 1.00 mg/dL 0.84 0.68 0.87  Sodium 135 - 145 mmol/L 136 142 142  Potassium 3.5 - 5.1 mmol/L 3.7 3.7 3.6  Chloride 98 - 111 mmol/L 102 105 103  CO2 22 -  32 mmol/L '22 28 31  ' Calcium 8.9 - 10.3 mg/dL 8.7(L) 9.2 9.3  Total Protein 6.5 - 8.1 g/dL 7.0 - 6.5  Total Bilirubin 0.3 - 1.2 mg/dL 0.5 - 0.3  Alkaline Phos 38 - 126 U/L 76 - -  AST 15 - 41 U/L 20 - 19  ALT 0 - 44 U/L 12 - 3(L)      RADIOGRAPHIC STUDIES: I have personally reviewed the radiological images as listed and agreed with the findings in the report. DG Chest 2 View  Result Date: 03/01/2019 CLINICAL DATA:  Fever, cough EXAM: CHEST - 2 VIEW COMPARISON:  01/01/2019, 01/26/2019 FINDINGS: Stable positioning of a right-sided chest port. The heart size and mediastinal contours are within normal limits. Mild streaky bibasilar opacities. No pleural effusion or pneumothorax. The visualized skeletal structures are unremarkable. IMPRESSION: Mild streaky bibasilar opacities, which do not appear significantly progressed from prior CT 01/26/2019. Electronically Signed   By: Davina Poke D.O.   On: 03/01/2019 12:57     ASSESSMENT & PLAN:  Sheryl [name] is a 49 y.o. female with   1. Worsening fatigue, anorexia and nausea  -possible related to her recent radiation and steroid tapering. Dexa was reduced to 68m daily for a week before she stopped 2 days ago -IVF with NS 1L, zofran and dexa 132mtoday -I instructed her to restart dexamethasone at 4 mg daily for 1 week, then will gradually taper off in 2-3 weeks -Lab reviewed, CBC normal, CMP was unremarkable except low albumin -will ask Dr. VaMickeal Skinnero see her later this week  -more IVF this Friday   2. 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 -She was diagnosed in 10/2014. She was previously treated with CyborD and bone marrow transplant. She progressed on maintenance Velcade in 02/2016. She has been treated with multiple line chemo -Shepreviouslysawtransplant specialist Dr. HoNadara MustardShe has concern about her risk and overall benefits of procedure. At this point she has declined the transplant and would rather continue current treatment. -Due to her respiratory symptoms, and CT evidence of pneumonitis, I have held Pomalyst and Kyprolis since 11/21/18. 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 RoNorma Fredricksonbout her next LINE treatment, likely [place]She is not ready to start now    3. Atypical pneumonia versus interstitial pneumonitis, 11/21/18, possible related to chemo  -Chemo has been on hold since.  -She received a dose of Medrol pack. She is clinically better, lung exam also improved today  -She was referred and seen by Dr. IcValeta HarmsCT chest was not approved by insurance and was postponed. She plans to reschedule soon. She may need a bronchoscopy depending on the CT scan findings  -She subsequently had Covid, not requiring hospitalization or any specific treatment  -Her respiratory symptoms has much improved overall  4.Chronichepatitis B infection -Shehas knownchronichepatitis B infection,developed significant transaminitis and hyperbilirubinemiainJanuary 2019 -Sherestartedher Heb Btreatmentin 02/2017, controlled now -She has not been see by Dr. SnBaxter Flatteryn about 1 year.   5. Type 2 diabetes, steroids induced -Previouslytreated with Metformin.resolved since she came off dexa -will monitor since she is on dexa now.  6. GERD, history  of GI bleeding and nausea  -Colonoscopy on 11/26/16 per Dr. Ardis Hughs notable for a polyp in the transverse colon, revealed to be tubular adenoma and negative for high grade dysplasia or malignancy.  -Continue omeprazole, will increase to twice daily  Plan -IVF with normal saline 1 L over 2 hours, IV Zofran and dexamethasone 10 mg -She will restart dexamethasone 4 mg daily for 1 week, then gradually taper off in 2 weeks -I called in nystatin mouthwash -UA, urine and blood culture today and a chest x-ray -She will return on Friday for more IV fluids. -f/u next week    No problem-specific Assessment & Plan notes found for this encounter.   Orders Placed This Encounter  Procedures  . DG Chest 2 View    Standing Status:   Future    Number of Occurrences:   1    Standing Expiration Date:   02/29/2020    Order Specific Question:   Reason for Exam (SYMPTOM  OR DIAGNOSIS REQUIRED)    Answer:   fever and mild dry cough, rule out infection    Order Specific Question:   Is patient pregnant?    Answer:   No    Order Specific Question:   Preferred imaging location?    Answer:   [facility]    Order Specific Question:   Radiology Contrast Protocol - do NOT remove file path    Answer:   [email or web address]   All questions were answered. The patient knows to call the clinic with any problems, questions or concerns. No barriers to learning was detected. I spent 37mnutes counseling the patient face to face. The total time spent in the appointment was 45 minutes and more than 50% was on counseling and review of test results     YTruitt Merle MD 03/01/2019    Addendum Patient went to bathroom after left symptom management clinic, had a bowel movement, and was found to be diaphoretic, extremely weak when she walked out of the bathroom.  Vital signs were stable, I saw her, she denies loss of consciousness.  Orthostatic vital sign was negative.  This is likely vasovagal.  She felt  better after a few minutes.  She was discharged from our clinic in a wheelchair. I will call her tomorrow for f/u.  YTruitt Merle 03/01/2019

## 2019-03-01 NOTE — Patient Instructions (Signed)

## 2019-03-02 ENCOUNTER — Other Ambulatory Visit: Payer: Self-pay | Admitting: Hematology

## 2019-03-02 ENCOUNTER — Telehealth: Payer: Self-pay | Admitting: Hematology

## 2019-03-02 LAB — URINE CULTURE: Culture: NO GROWTH

## 2019-03-02 MED ORDER — FLUCONAZOLE 200 MG PO TABS: 200.0000 mg | ORAL_TABLET | Freq: Every day | ORAL | 0 refills | Status: DC

## 2019-03-02 NOTE — Telephone Encounter (Signed)
Scheduled appt per 1/26 los.  Spoke with pt daughter and she is aware of the scheduled appt dates and time.

## 2019-03-02 NOTE — Progress Notes (Signed)
DISCONTINUE ON PATHWAY REGIMEN - Multiple Myeloma and Other Plasma Cell Dyscrasias     A cycle is every 28 days:     Carfilzomib      Carfilzomib      Carfilzomib      Dexamethasone      Dexamethasone   **Always confirm dose/schedule in your pharmacy ordering system**  REASON: Disease Progression PRIOR TREATMENT: [id number]: Kd-Weekly (Carfilzomib 20/70 mg/m2 + Dexamethasone 40 mg) q28 Days Until Progression or Unacceptable Toxicity TREATMENT RESPONSE: Partial Response (PR)  START OFF PATHWAY REGIMEN - Multiple Myeloma and Other Plasma Cell Dyscrasias   [id number]:Belantamab mafodotin 2.5 mg/kg IV D1 q21 Days:   A cycle is every 21 days:     Belantamab mafodotin-blmf   **Always confirm dose/schedule in your pharmacy ordering system**  Patient Characteristics: Relapsed / Refractory, Second through Fourth Lines of Therapy R-ISS Staging: Not Applicable Disease Classification: Relapsed Line of Therapy: Fourth Line Intent of Therapy: Non-Curative / Palliative Intent, Discussed with Patient

## 2019-03-03 ENCOUNTER — Telehealth: Payer: Self-pay

## 2019-03-03 MED FILL — FLUCONAZOLE 200 MG TAB: 200 | 14 days supply | Qty: 14 | Fill #0

## 2019-03-03 NOTE — Telephone Encounter (Signed)
Spoke with patient's daughter [name] per Dr. Burr Medico to let her know that her urine test was positive for yeast, no bacterial growth so far.  She recommends she take Fluconazole 200 mg daily for 2 weeks.  Her chest x-ray was okay.  The daughter verbalized an understanding.

## 2019-03-03 NOTE — Telephone Encounter (Signed)
-----   Message from Truitt Merle, MD sent at 03/02/2019 11:17 PM EST ----- Please let pt know that her urine test was positive for yeast, no bacteria growth so far. I recommend fluconazole 200mg  daily for 2 weeks. Her CXR was OK. Please call her daughter.   Truitt Merle  03/02/2019

## 2019-03-04 ENCOUNTER — Other Ambulatory Visit: Payer: Self-pay

## 2019-03-04 ENCOUNTER — Other Ambulatory Visit: Payer: Self-pay | Admitting: Internal Medicine

## 2019-03-04 ENCOUNTER — Inpatient Hospital Stay: Payer: Medicaid Other

## 2019-03-04 ENCOUNTER — Inpatient Hospital Stay (HOSPITAL_BASED_OUTPATIENT_CLINIC_OR_DEPARTMENT_OTHER): Payer: Medicaid Other | Admitting: Internal Medicine

## 2019-03-04 VITALS — BP 105/76 | HR 127 | Temp 97.8°F | Resp 18 | Ht 62.0 in | Wt 148.3 lb

## 2019-03-04 DIAGNOSIS — G542 Cervical root disorders, not elsewhere classified: Secondary | ICD-10-CM | POA: Diagnosis not present

## 2019-03-04 DIAGNOSIS — C9012 Plasma cell leukemia in relapse: Secondary | ICD-10-CM | POA: Diagnosis not present

## 2019-03-04 DIAGNOSIS — M5412 Radiculopathy, cervical region: Secondary | ICD-10-CM | POA: Diagnosis not present

## 2019-03-04 DIAGNOSIS — R5383 Other fatigue: Secondary | ICD-10-CM | POA: Diagnosis not present

## 2019-03-04 DIAGNOSIS — Z95828 Presence of other vascular implants and grafts: Secondary | ICD-10-CM

## 2019-03-04 MED ORDER — HEPARIN SOD (PORK) LOCK FLUSH 100 UNIT/ML IV SOLN
500.0000 [IU] | Freq: Once | INTRAVENOUS | Status: AC | PRN
  Administered 2019-03-04: 500 [IU]
  Filled 2019-03-04: qty 5

## 2019-03-04 MED ORDER — SODIUM CHLORIDE 0.9% FLUSH
10.0000 mL | INTRAVENOUS | Status: DC | PRN
  Administered 2019-03-04: 10 mL
  Filled 2019-03-04: qty 10

## 2019-03-04 MED ORDER — GABAPENTIN 300 MG PO CAPS: 300.0000 mg | ORAL_CAPSULE | Freq: Three times a day (TID) | ORAL | 3 refills | Status: DC

## 2019-03-04 MED ORDER — SODIUM CHLORIDE 0.9 % IV SOLN
Freq: Once | INTRAVENOUS | Status: AC
  Filled 2019-03-04: qty 250

## 2019-03-04 NOTE — Patient Instructions (Signed)

## 2019-03-04 NOTE — Progress Notes (Signed)
[facility] [place], [place] [address] [phone]   Interval Evaluation  Date of Service: 03/04/19 Patient Name: Sheryl [name] Patient MRN: [id number] Patient DOB: 1970/03/01 Provider: Ventura Sellers, MD  Identifying Statement:  Iyonna Rish is a 49 y.o. 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   10/10/2014 Miscellaneous   Peripheral blood chemistry and leukocytosis with total white count 78K, comprised of large plasma cells and his normocytic anemia. There is a myeloid left shift with previous surgical radium blasts. Flow cytometry showed 64% plasma cells   10/10/2014 Bone Marrow Biopsy   Markedly hypercellular marrow (95%), Atypical plasma cells comprise 57% of the cellularity. There was diminished multilineage in hematopoiesis with adequate maturation. Breasts less than 1%), no overt dysplasia of the myeloid or erythroid lineages.    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   05/07/2015 - 05/12/2015 Hospital Admission   [facility], [facility] and abdominal pain. ID workup was negative, EGD showed evidence of gastritis and duodenitis, no H. pylori or CMV.   05/20/2015 - 05/24/2015 Hospital Admission   [facility]   07/11/2015 Bone Marrow Biopsy   Post transplant 100 a bone marrow biopsy showed hypocellular marrow, 20%, no increase in plasma cells (2%) or other abnormalities.   08/22/2015 - 01/02/2016 Chemotherapy   MaintenanceCyborD (cytoxan 3026mm2 iv, bortezomib 1.5 mg/m, dexamethasone 40 mg, every 2 weeks, changed to Velcade maintenance after 4 months treatment   01/16/2016 - 04/19/2016 Chemotherapy   Maintenance Velcade 1.3 mg/m every 2 weeks   02/22/2016 Pathology Results   BONE MARROW: Diagnosis Bone Marrow, Aspirate,Biopsy, and Clot, right iliac - NORMOCELLULAR BONE MARROW FOR AGE WITH TRILINEAGE HEMATOPOIESIS. - PLASMACYTOSIS (PLASMA CELLS 12%). - SEE COMMENT. PERIPHERAL BLOOD: - OCCASIONAL CIRCULATING PLASMA CELLS.   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    04/18/2016 - 03/26/2017 Chemotherapy   Daratumumab per protocol  CyBorD every week, cytoxan was held after 04/24/2016 dye to cytopenia and infection  -discontinued due to Hep B flare   05/11/2016 - 05/16/2016 Hospital Admission   Healthcare-associated pneumonia   02/16/2017 - 02/21/2017 Hospital Admission   Admission diagnosis: Abnormal LFT's Additional comments: Assoc diagnoses: Hep B flare, transminitis, dehydration, fever, SIRS   04/06/2017 - 11/22/2018 Chemotherapy   Carfilzomib 56 mg/m2 on days 1 and 15 (except 2031m2 on C1D1 and C1D2) with dexamethasone 20 mg on same days and pomalidomide 2 mg on days 1-21 of 28 day cycle on 04/13/17, dexa stopped on 07/06/2017 due to her CR  -She restarted her Pomalyst in 03/2018 (off for 2-3 months due to insurance, delivery issues),  held since 11/22/18 due to pulmonary issues.    08/26/2017 - 08/28/2017 Hospital Admission   Admit date: 08/26/2017 Admission diagnosis: RLL  Pneumonia    04/19/2018 Bone Marrow Biopsy   Bone Marrow Biopsy 04/19/18 at [facility] Bone Marrow (BM) and  Peripheral Blood (PB) FINAL PATHOLOGIC DIAGNOSIS BONE MARROW: Hypocellular marrow (20%) with trilineage hematopoiesis. No [plasma cell myeloma identified. See comment. PERIPHERAL BLOOD: Mild anemia. No circulating plasma cells seen. See CBC data and comment.    01/29/2019 Imaging   MRI of cervical spine: IMPRESSION: 1. 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   Although the lesion tracks out of the left C5 neural foramen and continues along the course of the left C5 nerve, this tumor growth rate argues against a schwannoma, or a meningioma.   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 However, the C4 vertebra is unaffected and other visible osseous structures appear stable and normal.   2. Associated moderate to severe C4 level spinal cord compression, although no cord edema is evident.   3. Elsewhere the neuro axis visible on these images remains normal.   4. Mild asymmetry of the left level 2 cervical lymph nodes is again noted, but stable and indeterminate.   02/03/2019 - 02/11/2019 Radiation Therapy   [facility] to C4 plasmacytoma at [facility]    02/18/2019 Imaging   PET at [facility]: 1. No suspicious hypermetabolic tissue in neck.. 2. Focal activity in the proximal esophagus is favored related to radiation treatment. 3. No focal hypermetabolic activity in the skeleton. No plasmacytoma.   02/22/2019 Bone Marrow Biopsy   Evaluation is limited due to aspicular aspirate smears. The bone marrow shows adequate trilineage hematopoiesis without significant dysplasia or increased blasts. Plasma cells represent about 3% of cell on aspirate smears, with low-grade morphology (BARTL I). Appropriately controlled immunohistochemical stains are performed on the core biopsy. CD138  highlights interstitial and focal aggregates of plasma cells comprising approximately 5% of total cells. These plasma cells are lambda light chain restricted, as demonstrated by kappa and lambda in-situ hybridization.  The overall findings are consistent with involvement by the previously diagnosed lambda light chain restricted plasma cell neoplasm.   03/16/2019 -  Chemotherapy   The patient had belantamab mafodotin-blmf (BLENREP) 170 mg in sodium chloride 0.9 % 250 mL (0.6709 mg/mL) chemo infusion, 2.5 mg/kg, Intravenous,  Once, 0 of 4 cycles  for chemotherapy treatment.      Interval History: Ms. Inga presents today for follow up after recent clinical developments.  She eventually was able to obtain a C-spine MRI after continued progression of pain symptoms.  Scan demonstrated a large presumed plasmacytoma at C4/C5 with cord compression and significant distal propagation along the nerve root extending extradurally.  She describes modest symptomatic improvement following radiotherapy at [facility] on 02/03/19.  Today she describes some relapse of pain symptoms, although not quite as severe as prior to imaging and treatment.  Had been on decadron for several days with some efficacy, but stopped "cold [place]" from dose of 79m to zero.  Continues on oxycodone 515mq8 PRN and Gabapentin 30084mID.  Motor complaints at the shoulder are difficult to differentiate from pain.   Jhene Bellis presents today for assessment of neck and arm pain.  She describes severe pain, radiating from the left side of her neck down to her left should and down to mid-arm.  Pain does not reach fingers.  Discomfort is exacerbated by movement such as head turning or reaching out with arm.  Pain is particularly severe at times in the shoulder, although she is very clear that it radiates down from the neck rather than starting in the shoulder.  She feels "tight" on the left side and range of motion is limited in the arm.  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  Otherwise denies gait impairment or dysuria. Recently obtained an MRI of the neck for review today.  Medications: Current Outpatient Medications on File Prior to Visit  Medication Sig Dispense Refill   acyclovir (ZOVIRAX) 800 MG tablet Take 1 tablet (800 mg total) by mouth 2 (two) times daily. 60 tablet 3   amLODipine (NORVASC) 5 MG tablet Take 5 mg by mouth daily.     aspirin 81 MG chewable tablet Chew 81 mg by mouth daily.     dexamethasone (DECADRON) 2 MG tablet Take 2 tablets (4 mg total) by mouth 2 (two) times daily. 30 tablet 0   feeding supplement, ENSURE ENLIVE, (ENSURE ENLIVE) LIQD Take 237 mLs by mouth 2 (two) times daily between meals. 237 mL 12   fluconazole (DIFLUCAN) 200 MG tablet Take 1 tablet (200 mg total) by mouth daily. 14 tablet 0   gabapentin (NEURONTIN) 300 MG capsule Take 300 capsules by mouth as directed. Take 300 mg by mouth in morning and afternoon and 662m by mouth in the evening     ibuprofen (ADVIL,MOTRIN) 200 MG tablet Take 200 mg by mouth every 6 (six) hours as needed for moderate pain.     lactulose (CHRONULAC) 10 GM/15ML solution Take 15 mLs by mouth every 3 (three) hours as needed.     morphine (MSIR) 15 MG tablet Take 1 tablet (15 mg total) by mouth every 4 (four) hours as needed for severe pain. 7 tablet 0   nystatin (MYCOSTATIN) 100000 UNIT/ML suspension Take 5 mLs (500,000 Units total) by mouth 4 (four) times daily. 473 mL 0   omeprazole (PRILOSEC) 20 MG capsule Take 1 capsule (20 mg total) by mouth daily. 30 capsule 5   ondansetron (ZOFRAN ODT) 8 MG disintegrating tablet Take 1 tablet (8 mg total) by mouth every 8 (eight) hours as needed for nausea or vomiting. 40 tablet 1   oxyCODONE (OXY IR/ROXICODONE) 5 MG immediate release tablet Take 1 tablet (5 mg total) by mouth every 8 (eight) hours as needed for severe  pain. 20 tablet 0   polyethylene glycol (MIRALAX / GLYCOLAX) packet Take 17 g by mouth daily as needed (constipation). 14 each 1   prochlorperazine (COMPAZINE) 10 MG tablet Take 1 tablet (10 mg total) by mouth every 6 (six) hours as needed for nausea or vomiting. 30 tablet 3   senna-docusate (SENOKOT-S) 8.6-50 MG tablet Take 2 tablets by mouth 2 (two) times daily.     tenofovir (VIREAD) 300 MG tablet Take 1 tablet (300 mg total) by mouth daily. 30 tablet 11   traMADol (ULTRAM) 50 MG tablet Take 1 tablet (50 mg total) by mouth every 6 (six) hours as needed. 30 tablet 0   Current Facility-Administered Medications on File Prior to Visit  Medication Dose Route Frequency Provider Last Rate Last Admin   sodium chloride 0.9 % injection 10 mL  10 mL Intravenous PRN FTruitt Merle MD   10 mL at 12/11/15 1532   sodium chloride 0.9 % injection 10 mL  10 mL Intravenous PRN FTruitt Merle MD   10 mL at 06/11/16 1505  sodium chloride 0.9 % injection 10 mL  10 mL Intravenous PRN Truitt Merle, MD   10 mL at 08/04/17 1643   sodium chloride flush (NS) 0.9 % injection 10 mL  10 mL Intravenous PRN Truitt Merle, MD   10 mL at 10/09/15 1717    Allergies: No Known Allergies Past Medical History:  Past Medical History:  Diagnosis Date   Chills with fever    intermittently since d/c from hospital   Dysuria-frequency syndrome    w/ pink urine   GERD (gastroesophageal reflux disease)    Hepatitis    History of positive PPD    DX 2011--  CXR DONE NO EVIDENCE   History of ureter stent    Hydronephrosis, right    Neuromuscular disorder (HCC)    legs numb intermittently   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[place])    Pneumonia    Right ureteral stone    Urosepsis 8/14   admitted to [facility]   Past Surgical History:  Past Surgical History:  Procedure Laterality Date   CYSTOSCOPY W/ URETERAL STENT PLACEMENT Right 09/25/2012   Procedure: CYSTOSCOPY WITH RETROGRADE PYELOGRAM/URETERAL STENT PLACEMENT;  Surgeon: Alexis Frock, MD;  Location: [facility];  Service: Urology;  Laterality: Right;   CYSTOSCOPY WITH RETROGRADE PYELOGRAM, URETEROSCOPY AND STENT PLACEMENT Right 10/15/2012   Procedure: CYSTOSCOPY WITH RETROGRADE PYELOGRAM, URETEROSCOPY AND REMOVAL STENT WITH  STENT PLACEMENT;  Surgeon: Alexis Frock, MD;  Location: [facility];  Service: Urology;  Laterality: Right;   ESOPHAGOGASTRODUODENOSCOPY (EGD) WITH PROPOFOL N/A 11/16/2014   Procedure: ESOPHAGOGASTRODUODENOSCOPY (EGD) WITH PROPOFOL;  Surgeon: Milus Banister, MD;  Location: [facility];  Service: Endoscopy;  Laterality: N/A;   HOLMIUM LASER APPLICATION Right 1/61/0960   Procedure: HOLMIUM LASER APPLICATION;  Surgeon: Alexis Frock, MD;  Location: [facility];  Service: Urology;  Laterality: Right;   LIVER BIOPSY     OTHER SURGICAL HISTORY Right    removal of ovarian cyst   removal of uterine cyst     years ago   RIGHT VATS W/ DRAINAGE PEURAL EFFUSION AND BX'S  10-30-2008   Social History:  Social History   Socioeconomic History   Marital status: Single    Spouse name: Not on file   Number of children: 3   Years of education: Not on file   Highest education level: Not on file  Occupational History   Not on file  Tobacco Use   Smoking status: Never Smoker   Smokeless tobacco: Never Used  Substance and Sexual Activity   Alcohol use: No    Alcohol/week: 0.0 standard drinks   Drug use: No   Sexual activity: Not Currently    Birth control/protection: Abstinence  Other Topics Concern   Not on file  Social History Narrative   Not on file   Social Determinants of Health   Financial Resource Strain:    Difficulty of Paying Living Expenses: Not on file  Food Insecurity:    Worried About Charity fundraiser in the Last Year: Not on file   [employer] of Food in the Last Year: Not on file  Transportation Needs:    Lack of Transportation (Medical): Not on file   Lack of Transportation  (Non-Medical): Not on file  Physical Activity:    Days of Exercise per Week: Not on file   Minutes of Exercise per Session: Not on file  Stress:    Feeling of Stress : Not on file  Social Connections:    Frequency  of Communication with Friends and Family: Not on file   Frequency of Social Gatherings with Friends and Family: Not on file   Attends Religious Services: Not on file   Active Member of Clubs or Organizations: Not on file   Attends Archivist Meetings: Not on file   Marital Status: Not on file  Intimate Partner Violence:    Fear of Current or Ex-Partner: Not on file   Emotionally Abused: Not on file   Physically Abused: Not on file   Sexually Abused: Not on file   Family History:  Family History  Problem Relation Age of Onset   Stomach cancer Mother    Lung disease Father    Asthma Father     Review of Systems: Constitutional: Denies fevers, chills or abnormal weight loss Eyes: Denies blurriness of vision Ears, nose, mouth, throat, and face: Denies mucositis or sore throat Respiratory: Denies cough, dyspnea or wheezes Cardiovascular: Denies palpitation, chest discomfort or lower extremity swelling Gastrointestinal:  Denies nausea, constipation, diarrhea GU: Denies dysuria or incontinence Skin: Denies abnormal skin rashes Neurological: Per HPI Musculoskeletal: Per HPI Behavioral/Psych: Denies anxiety, disturbance in thought content, and mood instability   Physical Exam: Vitals:   03/04/19 1019  BP: 105/76  Pulse: (!) 127  Resp: 18  Temp: 97.8 F (36.6 C)  SpO2: 97%   KPS: 80. General: Alert, cooperative, pleasant, in no acute distress Head: Craniotomy scar noted, dry and intact. EENT: No conjunctival injection or scleral icterus. Oral mucosa moist Lungs: Resp effort normal Cardiac: Regular rate and rhythm Abdomen: Soft, non-distended abdomen Skin: No rashes cyanosis or petechiae. Extremities: No clubbing or  edema  Neurologic Exam: Mental Status: Awake, alert, attentive to examiner. Oriented to self and environment. Language is fluent with intact comprehension.  Cranial Nerves: Visual acuity is grossly normal. Visual fields are full. Extra-ocular movements intact. No ptosis. Face is symmetric, tongue midline. Motor: Tone and bulk are normal. Power is full in both arms and legs, with some pain limitation in left arm proximally. Reflexes are symmetric, no pathologic reflexes present. Intact finger to nose bilaterally Sensory: Intact to light touch and temperature Gait: Normal   Labs: I have reviewed the data as listed    Component Value Date/Time   NA 136 03/01/2019 0905   NA 140 02/02/2017 1317   K 3.7 03/01/2019 0905   K 4.0 02/02/2017 1317   CL 102 03/01/2019 0905   CO2 22 03/01/2019 0905   CO2 25 02/02/2017 1317   GLUCOSE 148 (H) 03/01/2019 0905   GLUCOSE 121 02/02/2017 1317   BUN 14 03/01/2019 0905   BUN 13.8 02/02/2017 1317   CREATININE 0.84 03/01/2019 0905   CREATININE 0.87 01/26/2019 1643   CREATININE 0.8 02/02/2017 1317   CALCIUM 8.7 (L) 03/01/2019 0905   CALCIUM 9.3 02/02/2017 1317   PROT 7.0 03/01/2019 0905   PROT 6.9 02/02/2017 1317   ALBUMIN 3.0 (L) 03/01/2019 0905   ALBUMIN 3.5 02/02/2017 1317   AST 20 03/01/2019 0905   AST 47 (H) 02/02/2017 1317   ALT 12 03/01/2019 0905   ALT 23 02/02/2017 1317   ALKPHOS 76 03/01/2019 0905   ALKPHOS 98 02/02/2017 1317   BILITOT 0.5 03/01/2019 0905   BILITOT <0.22 02/02/2017 1317   GFRNONAA >60 03/01/2019 0905   GFRNONAA 78 01/26/2019 1643   GFRAA >60 03/01/2019 0905   GFRAA 90 01/26/2019 1643   Lab Results  Component Value Date   WBC 7.2 03/01/2019   NEUTROABS 4.5 03/01/2019  HGB 12.2 03/01/2019   HCT 38.1 03/01/2019   MCV 95.7 03/01/2019   PLT 147 (L) 03/01/2019    Imaging:  DG Chest 2 View  Result Date: 03/01/2019 CLINICAL DATA:  Fever, cough EXAM: CHEST - 2 VIEW COMPARISON:  01/01/2019, 01/26/2019 FINDINGS:  Stable positioning of a right-sided chest port. The heart size and mediastinal contours are within normal limits. Mild streaky bibasilar opacities. No pleural effusion or pneumothorax. The visualized skeletal structures are unremarkable. IMPRESSION: Mild streaky bibasilar opacities, which do not appear significantly progressed from prior CT 01/26/2019. Electronically Signed   By: Davina Poke D.O.   On: 03/01/2019 12:57   [place] ELASTOGRAPHY LIVER  Result Date: 02/15/2019 CLINICAL DATA:  Chronic hepatitis-B without delta agent, surveillance EXAM: US LIVER ELASTOGRAPHY TECHNIQUE: Sonography of the liver was performed. In addition, ultrasound elastography evaluation of the liver was performed. A region of interest was placed within the right lobe of the liver. Following application of a compressive sonographic pulse, tissue compressibility was assessed. Multiple assessments were performed at the selected site. Median tissue compressibility was determined. Previously, hepatic stiffness was assessed by shear wave velocity. Based on recently published Society of Radiologists in Ultrasound consensus article, reporting is now recommended to be performed in the SI units of pressure (kiloPascals) representing hepatic stiffness/elasticity. The obtained result is compared to the published reference standards. (cACLD= compensated Advanced Chronic Liver Disease) COMPARISON:  CT abdomen and pelvis 08/27/2017 FINDINGS: Liver: Normal echogenicity without nodularity. Small cyst RIGHT lobe 1.5 x 1.2 x 1.2 cm. No intrahepatic biliary ductal dilatation. Portal vein is patent on color Doppler imaging with normal direction of blood flow towards the liver. ULTRASOUND HEPATIC ELASTOGRAPHY Device: Siemens Helix VTQ Patient position: Supine Transducer: 5C1 Number of measurements: 10 Hepatic segment:  8 Median kPa: 2.5 kPa IQR: 0.5 IQR/Median kPa ratio: 0.2 Data quality:  Good Diagnostic category:  ?5 kPa: high probability of being normal  IMPRESSION: ULTRASOUND LIVER: Small RIGHT lobe hepatic cyst. Otherwise normal appearance. ULTRASOUND HEPATIC ELASTOGRAPHY: Median kPa: 2.5 kPa Diagnostic category:  ?5 kPa: high probability of being normal The use of hepatic elastography is applicable to patients with viral hepatitis and non-alcoholic fatty liver disease. At this time, there is insufficient data for the referenced cut-off values and use in other causes of liver disease, including alcoholic liver disease. Patients, however, may be assessed by elastography and serve as their own reference standard/baseline. In patients with non-alcoholic liver disease, the values suggesting compensated advanced chronic liver disease (cACLD) may be lower, and patients may need additional testing with elasticity results of 7-9 kPa. Please note that abnormal hepatic elasticity and shear wave velocities may also be identified in clinical settings other than with hepatic fibrosis, such as: acute hepatitis, elevated right heart and central venous pressures including use of beta blockers, veno-occlusive disease (Budd-Chiari), infiltrative processes such as mastocytosis/amyloidosis/infiltrative tumor/lymphoma, extrahepatic cholestasis, with hyperemia in the post-prandial state, and with liver transplantation. Correlation with patient history, laboratory data, and clinical condition recommended. Diagnostic Categories: ?5 kPa: high probability of being normal ?9 kPa: in the absence of other known clinical signs, rules out cACLD >9 kPa and ?13 kPa: suggestive of cACLD, but needs further testing >13 kPa: highly suggestive of cACLD ?17 kPa: highly suggestive of cACLD with an increased probability of clinically significant portal hypertension Electronically Signed   By: Lavonia Dana M.D.   On: 02/15/2019 13:04    Assessment/Plan 1. 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 Diosdado presents with recurrent clinical syndrome consistent  with mass along cervical nerve root per  above, likely plasmacytoma.  This has been treated with radiation therapy; we are hopeful for good response given radiosensitivity of presumed histology.  Motor deficits at C5 would likely affect shoulder extension, this is diffuclt to assess objectively because of significant pain limitation with this maneuver.  There is no appreciable motor weakness or somatosensory impairment otherwise.  We did recommend a second trial of decadron for symptomatic relief, depending on extent of inflammation contributing to pain symptoms.  Recommended taper of 81m daily x7 days, followed by 335mdaily x7 days, 11m39maily x7 days, then 1mg50mily x7 days before stopping.  Will refill Gabapentin 300mg64m.    She will call for further pain relapse if elevation of breakthrough analgesia is needed.    We appreciate the opportunity to participate in the care of Deliliah Klebba.  She should follow up with contrast enhanced MRI cervical spine in 3 months, or sooner if indicated clinically.  All questions were answered. The patient knows to call the clinic with any problems, questions or concerns. No barriers to learning were detected.  The total time spent in the encounter was 30 minutes and more than 50% was on counseling and review of test results   ZachaVentura SellersMedical Director of Neuro-Oncology [facility]ty HospitalesleBier9/21 10:46 AM

## 2019-03-06 LAB — CULTURE, BLOOD (SINGLE)
Culture: NO GROWTH
Special Requests: ADEQUATE

## 2019-03-07 ENCOUNTER — Other Ambulatory Visit: Payer: Self-pay | Admitting: *Deleted

## 2019-03-07 NOTE — Progress Notes (Signed)
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   10/10/2014 Miscellaneous   Peripheral blood chemistry and leukocytosis with total white count 78K, comprised of large plasma cells and his normocytic anemia. There is a myeloid left shift with previous surgical radium blasts. Flow cytometry showed 64% plasma cells   10/10/2014 Bone Marrow Biopsy   Markedly hypercellular marrow (95%), Atypical plasma cells comprise 57% of the cellularity. There was diminished multilineage in hematopoiesis with adequate maturation. Breasts less than 1%), no overt dysplasia of the myeloid or erythroid lineages.    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   05/07/2015 - 05/12/2015 Hospital Admission   [facility], [facility]. ID  workup was negative, EGD showed evidence of gastritis and duodenitis, no H. pylori or CMV.   05/20/2015 - 05/24/2015 Hospital Admission   [facility]   07/11/2015 Bone Marrow Biopsy   Post transplant 100 a bone marrow biopsy showed hypocellular marrow, 20%, no increase in plasma cells (2%) or other abnormalities.   08/22/2015 - 01/02/2016 Chemotherapy   MaintenanceCyborD (cytoxan 3034mm2 iv, bortezomib 1.5 mg/m, dexamethasone 40 mg, every 2 weeks, changed to Velcade maintenance after 4 months treatment   01/16/2016 - 04/19/2016 Chemotherapy   Maintenance Velcade 1.3 mg/m every 2 weeks   02/22/2016 Pathology Results   BONE MARROW: Diagnosis Bone Marrow, Aspirate,Biopsy, and Clot, right iliac - NORMOCELLULAR BONE MARROW FOR AGE WITH TRILINEAGE HEMATOPOIESIS. - PLASMACYTOSIS (PLASMA CELLS 12%). - SEE COMMENT. PERIPHERAL BLOOD: - OCCASIONAL CIRCULATING PLASMA CELLS.   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    04/18/2016 - 03/26/2017 Chemotherapy   Daratumumab per protocol  CyBorD every week, cytoxan was held after 04/24/2016 dye to cytopenia and infection  -discontinued due to Hep B flare   05/11/2016 - 05/16/2016 Hospital Admission   Healthcare-associated pneumonia   02/16/2017 - 02/21/2017 Hospital Admission   Admission diagnosis: Abnormal LFT's Additional comments: Assoc diagnoses: Hep B flare, transminitis, dehydration, fever, SIRS   04/06/2017 - 11/22/2018 Chemotherapy   Carfilzomib 56 mg/m2 on days 1 and 15 (except 2049m2 on C1D1 and C1D2) with dexamethasone 20 mg on same days and pomalidomide 2 mg on days 1-21 of 28 day cycle on 04/13/17, dexa stopped on 07/06/2017 due to her CR  -She restarted her Pomalyst in 03/2018 (off for 2-3 months due to insurance, delivery issues),  held since 11/22/18  due to pulmonary issues.    08/26/2017 - 08/28/2017 Hospital Admission   Admit  date: 08/26/2017 Admission diagnosis: RLL Pneumonia    04/19/2018 Bone Marrow Biopsy   Bone Marrow Biopsy 04/19/18 at [facility] Bone Marrow (BM) and Peripheral Blood (PB) FINAL PATHOLOGIC DIAGNOSIS BONE MARROW: Hypocellular marrow (20%) with trilineage hematopoiesis. No [plasma cell myeloma identified. See comment. PERIPHERAL BLOOD: Mild anemia. No circulating plasma cells seen. See CBC data and comment.    01/29/2019 Imaging   MRI of cervical spine: IMPRESSION: 1. 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   Although the lesion tracks out of the left C5 neural foramen and continues along the course of the left C5 nerve, this tumor growth rate argues against a schwannoma, or a meningioma.   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 However, the C4 vertebra is unaffected and other visible osseous structures appear stable and normal.   2. Associated moderate to severe C4 level spinal cord compression, although no cord edema is evident.   3. Elsewhere the neuro axis visible on these images remains normal.   4. Mild asymmetry of the left level 2 cervical lymph nodes is again noted, but stable and indeterminate.   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 No suspicious hypermetabolic tissue in neck.. 2. Focal activity in the proximal esophagus is favored related to radiation treatment. 3. No focal hypermetabolic activity in the skeleton. No plasmacytoma.   02/22/2019 Bone Marrow Biopsy   Evaluation is limited due to aspicular aspirate smears. The bone marrow shows adequate trilineage hematopoiesis without significant dysplasia or increased blasts. Plasma cells represent about 3% of cell on  aspirate smears, with low-grade morphology (BARTL I). Appropriately controlled immunohistochemical stains are performed on the core biopsy. CD138 highlights interstitial and focal aggregates of plasma cells comprising approximately 5% of total cells. These plasma cells are lambda light chain restricted, as demonstrated by kappa and lambda in-situ hybridization.  The overall findings are consistent with involvement by the previously diagnosed lambda light chain restricted plasma cell neoplasm.   03/09/2019 -  Chemotherapy   Blenrep every 3 weeks starting 03/09/19      CURRENT THERAPY:  Blenrep every 3 weeks starting 03/09/19  INTERVAL HISTORY:  Sheryl [name] is here for a follow up. She presents to the clinic with her daughter. She notes she feels mildly better than last week. She still feels fatigued and she is eating a little this week. She has been eating a side dish of rice twice a day. She does not eat meat but does eat mostly vegetables. She stopped drinking ensure because it makes her nauseous now. She notes she drinks 2 570m a day. Her daughter notes she felt dizzy yesterday.  She notes her shoulder and neck pain has improved. She is on Gabapentin and also oxycodone for severe pain. She is currently on Dexa 353mdaily this week. She notes her cough and SOB has resolved.  She notes she started Blenrep on 03/09/19. She did have an eye exam before starting. She has been started on eye drops. She has an appointment with another eye doctor the day before her next infusion. She would prefer to have her infusion in out clinic.     REVIEW OF SYSTEMS:  Constitutional: Denies fevers, chills or abnormal weight loss Eyes: Denies blurriness of vision Ears, nose, mouth, throat, and face: Denies mucositis or sore throat Respiratory: Denies cough, dyspnea or wheezes Cardiovascular: Denies palpitation, chest discomfort or lower extremity swelling Gastrointestinal:  Denies nausea, heartburn or change in  bowel habits Skin: Denies abnormal skin rashes Lymphatics: Denies new lymphadenopathy or easy bruising Neurological:Denies numbness, tingling or new weaknesses Behavioral/Psych: Mood is stable, no new changes  All other systems were reviewed with the patient and are negative.  MEDICAL HISTORY:  Past Medical History:  Diagnosis Date  . Chills with fever    intermittently since d/c from hospital  . Dysuria-frequency syndrome    w/ pink urine  . GERD (gastroesophageal reflux disease)   . Hepatitis   . History of positive PPD    DX 2011--  CXR DONE NO EVIDENCE  . History of ureter stent   . Hydronephrosis, right   . Neuromuscular disorder (HCC)    legs numb intermittently  . 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 Pneumonia   . Right ureteral stone   . Urosepsis 8/14   admitted to [facility]    SURGICAL HISTORY: Past Surgical History:  Procedure Laterality Date  . CYSTOSCOPY W/ URETERAL STENT PLACEMENT Right 09/25/2012   Procedure: CYSTOSCOPY WITH RETROGRADE PYELOGRAM/URETERAL STENT PLACEMENT;  Surgeon: Alexis Frock, MD;  Location: [facility];  Service: Urology;  Laterality: Right;  . CYSTOSCOPY WITH RETROGRADE PYELOGRAM, URETEROSCOPY AND STENT PLACEMENT Right 10/15/2012   Procedure: CYSTOSCOPY WITH RETROGRADE PYELOGRAM, URETEROSCOPY AND REMOVAL STENT WITH  STENT PLACEMENT;  Surgeon: Alexis Frock, MD;  Location: [facility];  Service: Urology;  Laterality: Right;  . ESOPHAGOGASTRODUODENOSCOPY (EGD) WITH PROPOFOL N/A 11/16/2014   Procedure: ESOPHAGOGASTRODUODENOSCOPY (EGD) WITH PROPOFOL;  Surgeon: Milus Banister, MD;  Location: [facility];  Service: Endoscopy;  Laterality: N/A;  . HOLMIUM LASER APPLICATION Right 8/36/6294   Procedure: HOLMIUM LASER APPLICATION;  Surgeon: Alexis Frock, MD;  Location: [facility];  Service: Urology;  Laterality: Right;  . LIVER BIOPSY    . OTHER SURGICAL HISTORY Right    removal of ovarian cyst  . removal of uterine cyst      years ago  . RIGHT VATS W/ DRAINAGE PEURAL EFFUSION AND BX'S  10-30-2008    I have reviewed the social history and family history with the patient and they are unchanged from previous note.  ALLERGIES:  has No Known Allergies.  MEDICATIONS:  Current Outpatient Medications  Medication Sig Dispense Refill  . acyclovir (ZOVIRAX) 800 MG tablet Take 1 tablet (800 mg total) by mouth 2 (two) times daily. 60 tablet 3  . aspirin 81 MG chewable tablet Chew 81 mg by mouth daily.    Marland Kitchen dexamethasone (DECADRON) 2 MG tablet Take 2 tablets (4 mg total) by mouth 2 (two) times daily. 30 tablet 0  . feeding supplement, ENSURE ENLIVE, (ENSURE ENLIVE) LIQD Take 237 mLs by mouth 2 (two) times daily between meals. 237 mL 12  . fluconazole (DIFLUCAN) 200 MG tablet Take 1 tablet (200 mg total) by mouth daily. 14 tablet 0  . gabapentin (NEURONTIN) 300 MG capsule TAKE 1 CAPSULE (300 MG TOTAL) BY MOUTH 3 (THREE) TIMES DAILY. TAKE 300 MG BY MOUTH IN MORNING AND AFTERNOON AND 600MG BY MOUTH IN THE EVENING 90 capsule 3  . ibuprofen (ADVIL,MOTRIN) 200 MG tablet Take 200 mg by mouth every 6 (six) hours as needed for moderate pain.    Marland Kitchen lactulose (CHRONULAC) 10 GM/15ML solution Take  15 mLs by mouth every 3 (three) hours as needed.    Marland Kitchen morphine (MSIR) 15 MG tablet Take 1 tablet (15 mg total) by mouth every 4 (four) hours as needed for severe pain. 7 tablet 0  . nystatin (MYCOSTATIN) 100000 UNIT/ML suspension Take 5 mLs (500,000 Units total) by mouth 4 (four) times daily. 473 mL 0  . omeprazole (PRILOSEC) 20 MG capsule Take 1 capsule (20 mg total) by mouth daily. 30 capsule 5  . ondansetron (ZOFRAN ODT) 8 MG disintegrating tablet Take 1 tablet (8 mg total) by mouth every 8 (eight) hours as needed for nausea or vomiting. 40 tablet 1  . oxyCODONE (OXY IR/ROXICODONE) 5 MG immediate release tablet Take 1 tablet (5 mg total) by mouth every 8 (eight) hours as needed for severe pain. 20 tablet 0  . polyethylene glycol (MIRALAX /  GLYCOLAX) packet Take 17 g by mouth daily as needed (constipation). 14 each 1  . prochlorperazine (COMPAZINE) 10 MG tablet Take 1 tablet (10 mg total) by mouth every 6 (six) hours as needed for nausea or vomiting. 30 tablet 3  . tenofovir (VIREAD) 300 MG tablet Take 1 tablet (300 mg total) by mouth daily. 30 tablet 11  . traMADol (ULTRAM) 50 MG tablet Take 1 tablet (50 mg total) by mouth every 6 (six) hours as needed. 30 tablet 0  . amLODipine (NORVASC) 5 MG tablet Take 5 mg by mouth daily.    . mirtazapine (REMERON) 7.5 MG tablet Take 1 tablet (7.5 mg total) by mouth at bedtime. 30 tablet 1   No current facility-administered medications for this visit.   Facility-Administered Medications Ordered in Other Visits  Medication Dose Route Frequency Provider Last Rate Last Admin  . sodium chloride 0.9 % injection 10 mL  10 mL Intravenous PRN Truitt Merle, MD   10 mL at 12/11/15 1532  . sodium chloride 0.9 % injection 10 mL  10 mL Intravenous PRN Truitt Merle, MD   10 mL at 06/11/16 1505  . sodium chloride 0.9 % injection 10 mL  10 mL Intravenous PRN Truitt Merle, MD   10 mL at 08/04/17 1643  . sodium chloride flush (NS) 0.9 % injection 10 mL  10 mL Intravenous PRN Truitt Merle, MD   10 mL at 10/09/15 1717    PHYSICAL EXAMINATION: ECOG PERFORMANCE STATUS: 2 - Symptomatic, <50% confined to bed  Vitals:   03/11/19 1122  BP: 96/67  Pulse: 66  Resp: 17  Temp: (!) 97.5 F (36.4 C)  SpO2: 100%   Filed Weights   03/11/19 1122  Weight: 144 lb 12.8 oz (65.7 kg)    GENERAL:alert, no distress and comfortable SKIN: skin color, texture, turgor are normal, no rashes or significant lesions EYES: normal, Conjunctiva are pink and non-injected, sclera clear  NECK: supple, thyroid normal size, non-tender, without nodularity LYMPH:  no palpable lymphadenopathy in the cervical, axillary  LUNGS: clear to auscultation and percussion with normal breathing effort HEART: regular rate & rhythm and no murmurs and no lower  extremity edema ABDOMEN:abdomen soft, non-tender and normal bowel sounds (+) Epigastric tenderness Musculoskeletal:no cyanosis of digits and no clubbing  NEURO: alert & oriented x 3 with fluent speech, no focal motor/sensory deficits  LABORATORY DATA:  I have reviewed the data as listed CBC Latest Ref Rng & Units 03/01/2019 01/29/2019 01/01/2019  WBC 4.0 - 10.5 K/uL 7.2 4.5 7.5  Hemoglobin 12.0 - 15.0 g/dL 12.2 10.6(L) 12.4  Hematocrit 36.0 - 46.0 % 38.1 33.2(L) 40.2  Platelets  150 - 400 K/uL 147(L) 195 217     CMP Latest Ref Rng & Units 03/11/2019 03/01/2019 01/29/2019  Glucose 70 - 99 mg/dL 130(H) 148(H) 100(H)  BUN 6 - 20 mg/dL '20 14 13  ' Creatinine 0.44 - 1.00 mg/dL 0.81 0.84 0.68  Sodium 135 - 145 mmol/L 141 136 142  Potassium 3.5 - 5.1 mmol/L 3.5 3.7 3.7  Chloride 98 - 111 mmol/L 105 102 105  CO2 22 - 32 mmol/L '26 22 28  ' Calcium 8.9 - 10.3 mg/dL 8.8(L) 8.7(L) 9.2  Total Protein 6.5 - 8.1 g/dL 6.6 7.0 -  Total Bilirubin 0.3 - 1.2 mg/dL 0.3 0.5 -  Alkaline Phos 38 - 126 U/L 84 76 -  AST 15 - 41 U/L 57(H) 20 -  ALT 0 - 44 U/L 11 12 -      RADIOGRAPHIC STUDIES: I have personally reviewed the radiological images as listed and agreed with the findings in the report. No results found.   ASSESSMENT & PLAN:  Sheryl [name] is a 49 y.o. female with    1. fatigue, anorexia and nausea, Dizziness  -Probably related to her recent radiation and steroid tapering.  -She was treated with IV Fluids and another Dexa tapering dose starting 03/04/19 34m for 1 week, 364mfor 2nd week, 11m56mor 3rd week and 1mg17mr last week. She is currently on 3mg 25me. Followed by Dr. VasloMickeal Skinner symptoms have only mildly improved this week. She is still fatigued.  -She only eats rice and some vegetables, with barely any dairy and no meat. I discussed this is not enough nutrition for her. She mainly eats less due to low appetite. I will call in Mirtazapine today (03/11/19). Potential side effects discussed. She is  agreeable.  -She has been dizzy lately. She notes she drinks two 500ml 36my. I encouraged her to increase her liquid intake. I will stop her amlodipine and recommend she start monitoring her BP at home with Cuff. If above 140/90, she can restart Amlodipine.  -I will give IV Fluids today and continues as needed. She is agreeable.  -Per patient her thrush resolved. She will continue Magic mouthwash as needed.    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 She waspreviouslytreated with CyborD and bone marrow transplant. She progressed on maintenance Velcade in 02/2016. She has been treated with multiple line chemo -Shepreviouslysawtransplant specialist Dr. HowardNadara Mustardhas concern about her risk and overall benefits of procedure. At this point she has declined the transplant and would rather continue current treatment. 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  -Recent restaging bone marrow biopsy showed 3% plasma cell, PET scan was negative.  -She started next LINE treatment with Blenrep [facility]weeks on [facility]/09/[facility] of [facility] toxicity she has started eye drops and will continue frequent ophthalmology visits before each cycle. She will start next infusion in our clinic.  -f/u in 3 weeks    3. Epigastric Pain  -She also notes having epigastric pain lately. She is on 10ml l35md antiacid which has helped. She does have epigastric tenderness on exam today (03/11/19).  -If this does not improve, will obtain a abdominal CT    4. 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 No h/o neck trauma or injury -Her MRI Spine from 03/09/18 showedmild cervical disk bulging,her pain is related to the radiculopathy -She wasevaluated by neurologist Dr. Mickeal Skinner in our cancer center and had been treated with rounds of steroid.   -she received radiation, pain near resolved    5. Atypical pneumoniaversus interstitial pneumonitis, 11/21/18, and COVID infection  -Chemo has been on hold since.  -She received a dose of Medrol pack. She is clinically better, lung exam also improved today  -She was referred and seen byDr. Icard.CT chest was not approved by insurance and was postponed. She plans to reschedule soon. She may need a bronchoscopy depending on the CT scanfindings  -She subsequently had COVID19 in 12/2018, not requiring hospitalization or any specific treatment  -Her respiratory symptoms have resolved.   6.Chronichepatitis B infection -Shehas knownchronichepatitis B infection,developed significant transaminitis and hyperbilirubinemiainJanuary 2019 -Sherestartedher Heb Btreatmentin 02/2017, controlled now, continue Tenofovir  -She will continue f/u Dr. Baxter Flattery  7. Type 2 diabetes, steroids induced -Previouslytreated with Metformin.resolved since she came off dexa -will monitor since she is on dexa now.  8. GERD, history of GI bleeding -Colonoscopy on 11/26/16 per Dr. Ardis Hughs notable for a polyp in the transverse colon, revealed to be tubular adenoma and negative for high grade dysplasia or malignancy.  -Continue omeprazole   Plan -Continue Dexa taper -I called in Mirtazapine today  -Will hold amlodipine and monitor BP at home  -IV Fluids today  -Lab, f/u and Blenrep in 3 weeks    No problem-specific Assessment & Plan notes found for this encounter.   No orders of the defined types were placed in this encounter.  All questions were answered. The patient knows to call the clinic with any problems, questions or concerns. No  barriers to learning was detected. The total time spent in the appointment was 30 minutes.     Truitt Merle, MD 03/11/2019   I, Joslyn Devon, am acting as scribe for Truitt Merle, MD.   I have reviewed the above documentation for accuracy and completeness, and I agree with the above.

## 2019-03-07 NOTE — Telephone Encounter (Signed)
Refill request

## 2019-03-08 ENCOUNTER — Ambulatory Visit: Payer: Medicaid Other

## 2019-03-09 ENCOUNTER — Telehealth: Payer: Self-pay | Admitting: Internal Medicine

## 2019-03-09 ENCOUNTER — Ambulatory Visit: Payer: Medicaid Other

## 2019-03-09 ENCOUNTER — Other Ambulatory Visit: Payer: Medicaid Other

## 2019-03-09 ENCOUNTER — Ambulatory Visit: Payer: Medicaid Other | Admitting: Internal Medicine

## 2019-03-09 ENCOUNTER — Ambulatory Visit: Payer: Medicaid Other | Admitting: Nurse Practitioner

## 2019-03-09 MED FILL — GABAPENTIN 300 MG CAPSULE: 300 | 22 days supply | Qty: 90 | Fill #0

## 2019-03-09 NOTE — Telephone Encounter (Signed)
Scheduled per 02/01 los, patient has been called and notified.  

## 2019-03-11 ENCOUNTER — Telehealth: Payer: Self-pay | Admitting: Hematology

## 2019-03-11 ENCOUNTER — Inpatient Hospital Stay: Payer: Medicaid Other

## 2019-03-11 ENCOUNTER — Inpatient Hospital Stay (HOSPITAL_BASED_OUTPATIENT_CLINIC_OR_DEPARTMENT_OTHER): Payer: Medicaid Other | Admitting: Hematology

## 2019-03-11 ENCOUNTER — Other Ambulatory Visit: Payer: Self-pay

## 2019-03-11 ENCOUNTER — Encounter: Payer: Self-pay | Admitting: Hematology

## 2019-03-11 ENCOUNTER — Inpatient Hospital Stay: Payer: Medicaid Other | Attending: Hematology

## 2019-03-11 VITALS — BP 96/67 | HR 66 | Temp 97.5°F | Resp 17 | Ht 62.0 in | Wt 144.8 lb

## 2019-03-11 DIAGNOSIS — C9012 Plasma cell leukemia in relapse: Secondary | ICD-10-CM

## 2019-03-11 DIAGNOSIS — R5383 Other fatigue: Secondary | ICD-10-CM | POA: Insufficient documentation

## 2019-03-11 DIAGNOSIS — Z95828 Presence of other vascular implants and grafts: Secondary | ICD-10-CM

## 2019-03-11 DIAGNOSIS — R630 Anorexia: Secondary | ICD-10-CM | POA: Diagnosis not present

## 2019-03-11 DIAGNOSIS — B181 Chronic viral hepatitis B without delta-agent: Secondary | ICD-10-CM | POA: Diagnosis not present

## 2019-03-11 DIAGNOSIS — R110 Nausea: Secondary | ICD-10-CM | POA: Insufficient documentation

## 2019-03-11 DIAGNOSIS — C9010 Plasma cell leukemia not having achieved remission: Secondary | ICD-10-CM | POA: Insufficient documentation

## 2019-03-11 DIAGNOSIS — D649 Anemia, unspecified: Secondary | ICD-10-CM | POA: Diagnosis not present

## 2019-03-11 DIAGNOSIS — R42 Dizziness and giddiness: Secondary | ICD-10-CM | POA: Diagnosis not present

## 2019-03-11 LAB — CMP (CANCER CENTER ONLY)
ALT: 11 U/L (ref 0–44)
AST: 57 U/L — ABNORMAL HIGH (ref 15–41)
Albumin: 2.9 g/dL — ABNORMAL LOW (ref 3.5–5.0)
Alkaline Phosphatase: 84 U/L (ref 38–126)
Anion gap: 10 (ref 5–15)
BUN: 20 mg/dL (ref 6–20)
CO2: 26 mmol/L (ref 22–32)
Calcium: 8.8 mg/dL — ABNORMAL LOW (ref 8.9–10.3)
Chloride: 105 mmol/L (ref 98–111)
Creatinine: 0.81 mg/dL (ref 0.44–1.00)
GFR, Est AFR Am: >60 mL/min (ref >60)
GFR, Estimated: >60 mL/min (ref >60)
Glucose, Bld: 130 mg/dL — ABNORMAL HIGH (ref 70–99)
Potassium: 3.5 mmol/L (ref 3.5–5.1)
Sodium: 141 mmol/L (ref 135–145)
Total Bilirubin: 0.3 mg/dL (ref 0.3–1.2)
Total Protein: 6.6 g/dL (ref 6.5–8.1)

## 2019-03-11 MED ORDER — SODIUM CHLORIDE 0.9% FLUSH
10.0000 mL | Freq: Once | INTRAVENOUS | Status: AC
  Administered 2019-03-11: 10 mL
  Filled 2019-03-11: qty 10

## 2019-03-11 MED ORDER — SODIUM CHLORIDE 0.9 % IV SOLN: Freq: Once | INTRAVENOUS | Status: DC

## 2019-03-11 MED ORDER — MIRTAZAPINE 7.5 MG PO TABS: 7.5000 mg | ORAL_TABLET | Freq: Every day | ORAL | 1 refills | Status: AC

## 2019-03-11 MED ORDER — ONDANSETRON HCL 4 MG/2ML IJ SOLN: 8.0000 mg | Freq: Once | INTRAMUSCULAR | Status: DC

## 2019-03-11 MED ORDER — PROMETHAZINE HCL 25 MG/ML IJ SOLN: 25.0000 mg | Freq: Once | INTRAMUSCULAR | Status: DC

## 2019-03-11 MED ORDER — SODIUM CHLORIDE 0.9 % IV SOLN
Freq: Once | INTRAVENOUS | Status: AC
  Filled 2019-03-11: qty 250

## 2019-03-11 MED ORDER — DEXAMETHASONE SODIUM PHOSPHATE 10 MG/ML IJ SOLN: 10.0000 mg | Freq: Once | INTRAMUSCULAR | Status: DC

## 2019-03-11 MED ORDER — SODIUM CHLORIDE 0.9% FLUSH
10.0000 mL | INTRAVENOUS | Status: DC | PRN
  Administered 2019-03-11: 10 mL
  Filled 2019-03-11: qty 10

## 2019-03-11 MED ORDER — HEPARIN SOD (PORK) LOCK FLUSH 100 UNIT/ML IV SOLN
500.0000 [IU] | Freq: Once | INTRAVENOUS | Status: AC | PRN
  Administered 2019-03-11: 14:00:00 500 [IU]
  Filled 2019-03-11: qty 5

## 2019-03-11 MED FILL — MIRTAZAPINE 7.5 MG TABLET: 7.5 | 30 days supply | Qty: 30 | Fill #0

## 2019-03-11 NOTE — Patient Instructions (Signed)

## 2019-03-11 NOTE — Telephone Encounter (Signed)
Scheduled appt per 2/5 los.  Spoke with pt daughter and she is aware of the appt date and time.

## 2019-03-15 ENCOUNTER — Other Ambulatory Visit: Payer: Self-pay | Admitting: Nurse Practitioner

## 2019-03-15 DIAGNOSIS — B181 Chronic viral hepatitis B without delta-agent: Secondary | ICD-10-CM

## 2019-03-21 ENCOUNTER — Other Ambulatory Visit: Payer: Self-pay

## 2019-03-21 ENCOUNTER — Encounter: Payer: Self-pay | Admitting: Internal Medicine

## 2019-03-21 ENCOUNTER — Ambulatory Visit (INDEPENDENT_AMBULATORY_CARE_PROVIDER_SITE_OTHER): Payer: Medicaid Other | Admitting: Internal Medicine

## 2019-03-21 VITALS — BP 105/73 | HR 108 | Wt 156.0 lb

## 2019-03-21 DIAGNOSIS — B181 Chronic viral hepatitis B without delta-agent: Secondary | ICD-10-CM | POA: Diagnosis not present

## 2019-03-21 DIAGNOSIS — K5900 Constipation, unspecified: Secondary | ICD-10-CM | POA: Diagnosis not present

## 2019-03-21 DIAGNOSIS — R1010 Upper abdominal pain, unspecified: Secondary | ICD-10-CM | POA: Diagnosis not present

## 2019-03-21 MED FILL — TENOFOVIR DISOPROXIL FUMARA: 300 | 30 days supply | Qty: 30 | Fill #2

## 2019-03-21 NOTE — Progress Notes (Signed)
RFV: chronic hep b  Patient ID: Sheryl [name], female   DOB: [birth year], 49 y.o.   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 She  Is on steroids and pain meds for back pain. Also she reports having epigastric abdominal pain, sometimes worse with eating. No vomiting or diarrhea associated with these episodes. She feels that this has been going on for the month.  Still taking tenofovir every day.no episodes of jaundice   She also feels face is swelling with chemo treatment. Daughter has mentioned to onc team. Sees [facility] tomorrow to discuss.   Outpatient Encounter Medications as of 03/21/2019  Medication Sig  . VIREAD 300 MG tablet TAKE 1 TABLET (300 MG TOTAL) BY MOUTH DAILY.  Marland Kitchen acyclovir (ZOVIRAX) 800 MG tablet Take 1 tablet (800 mg total) by mouth 2 (two) times daily.  Marland Kitchen amLODipine (NORVASC) 5 MG tablet Take 5 mg by mouth daily.  Marland Kitchen aspirin 81 MG chewable tablet Chew 81 mg by mouth daily.  Marland Kitchen dexamethasone (DECADRON) 2 MG tablet Take 2 tablets (4 mg total) by mouth 2 (two) times daily.  . feeding supplement, ENSURE ENLIVE, (ENSURE ENLIVE) LIQD Take 237 mLs by mouth 2 (two) times daily between meals.  . fluconazole (DIFLUCAN) 200 MG tablet Take 1 tablet (200 mg total) by mouth daily.  Marland Kitchen gabapentin (NEURONTIN) 300 MG capsule TAKE 1 CAPSULE (300 MG TOTAL) BY MOUTH 3 (THREE) TIMES DAILY. TAKE 300 MG BY MOUTH IN MORNING AND AFTERNOON AND 600MG  BY MOUTH IN THE EVENING  . ibuprofen (ADVIL,MOTRIN) 200 MG tablet Take 200 mg by mouth every 6 (six) hours as needed for moderate pain.  Marland Kitchen lactulose (CHRONULAC) 10 GM/15ML solution Take 15 mLs by mouth every 3 (three) hours as needed.  . mirtazapine (REMERON) 7.5 MG tablet Take 1 tablet (7.5 mg total) by mouth at bedtime.  Marland Kitchen morphine (MSIR) 15 MG tablet Take 1 tablet (15 mg total) by mouth every 4 (four) hours as needed for severe  pain.  Marland Kitchen nystatin (MYCOSTATIN) 100000 UNIT/ML suspension Take 5 mLs (500,000 Units total) by mouth 4 (four) times daily.  Marland Kitchen omeprazole (PRILOSEC) 20 MG capsule Take 1 capsule (20 mg total) by mouth daily.  . ondansetron (ZOFRAN ODT) 8 MG disintegrating tablet Take 1 tablet (8 mg total) by mouth every 8 (eight) hours as needed for nausea or vomiting.  Marland Kitchen oxyCODONE (OXY IR/ROXICODONE) 5 MG immediate release tablet Take 1 tablet (5 mg total) by mouth every 8 (eight) hours as needed for severe pain.  . polyethylene glycol (MIRALAX / GLYCOLAX) packet Take 17 g by mouth daily as needed (constipation).  . prochlorperazine (COMPAZINE) 10 MG tablet Take 1 tablet (10 mg total) by mouth every 6 (six) hours as needed for nausea or vomiting.  . traMADol (ULTRAM) 50 MG tablet Take 1 tablet (50 mg total) by mouth every 6 (six) hours as needed.   Facility-Administered Encounter Medications as of 03/21/2019  Medication  . sodium chloride 0.9 % injection 10 mL  . sodium chloride 0.9 % injection 10 mL  . sodium chloride 0.9 % injection 10 mL  . sodium chloride flush (NS) 0.9 % injection 10 mL     Patient Active Problem List   Diagnosis Date Noted  . Cancer related pain 01/27/2019  . Abnormal findings on diagnostic imaging of lung 01/27/2019  . Thrombocytopenia, unspecified ([place]) 01/26/2019  .  COVID-19 virus infection 01/05/2019  . Cervical radiculopathy 03/15/2018  . Pneumonia 08/27/2017  . Hypokalemia 03/03/2017  . Cough   . Hyperbilirubinemia   . SIRS (systemic inflammatory response syndrome) ([place]) 02/16/2017  . Thrush 02/16/2017  . Chills with fever   . Right lower lobe pneumonia 02/09/2017  . Transaminitis 02/09/2017  . HCAP (healthcare-associated pneumonia) 05/11/2016  . Influenza B 05/11/2016  . Vomiting 05/11/2016  . Leukopenia 05/11/2016  . Plasma cell leukemia in relapse ([place]) 05/11/2016  . Hypersensitivity reaction 04/18/2016  . Hemorrhoid 12/03/2015  . Headache 12/03/2015  .  Dehydration 11/14/2015  . Fever 11/14/2015  . 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 Port catheter in place 05/30/2015  . Acute pyelonephritis   . UTI (urinary tract infection) 05/21/2015  . Sepsis ([place]) 05/20/2015  . Hepatitis 10/24/2014  . Plasma cell leukemia ([place]) 10/24/2014  . GIB (gastrointestinal bleeding) 10/07/2014  . Healthcare-associated pneumonia 10/07/2014  . Constipation 09/17/2014  . Leukocytosis 09/14/2014  . Nausea & vomiting 09/14/2014  . Abdominal pain 09/14/2014  . Abnormal LFTs 09/14/2014  . Bilateral leg numbness 09/14/2014  . Normocytic anemia 09/14/2014  . GERD (gastroesophageal reflux disease)   . Chronic hepatitis B without delta agent without hepatic coma (HCC)   . Gastroesophageal reflux disease without esophagitis      Health Maintenance Due  Topic Date Due  . URINE MICROALBUMIN  06/28/1978  . TETANUS/TDAP  06/28/1987  . PAP SMEAR-Modifier  06/27/1989  . MAMMOGRAM  06/28/2018    Soc hx: no smoking and drinking  Review of Systems 12 point ros is negative except what she is mentioned above Physical Exam   Wt 156 lb (70.8 kg)   LMP 11/24/2014   BMI 28.53 kg/m   Physical Exam  Constitutional:  oriented to person, place, and time. appears well-developed and well-nourished. No distress. Rounded facies HENT: [place]/AT, PERRLA, no scleral icterus Mouth/Throat: Oropharynx is clear and moist. No oropharyngeal exudate.  Cardiovascular: Normal rate, regular rhythm and normal heart sounds. Exam reveals no gallop and no friction rub.  No murmur heard.  Pulmonary/Chest: Effort normal and breath sounds normal. No respiratory distress.  has no wheezes.  Neck = supple, no nuchal rigidity Abdominal: Soft. Bowel sounds are normal.  exhibits no distension. There is no tenderness.    CBC Lab Results  Component Value Date   WBC 7.2 03/01/2019   RBC 3.98 03/01/2019   HGB 12.2 03/01/2019   HCT 38.1 03/01/2019   PLT 147 (L) 03/01/2019   MCV 95.7  03/01/2019   MCH 30.7 03/01/2019   MCHC 32.0 03/01/2019   RDW 14.6 03/01/2019   LYMPHSABS 1.8 03/01/2019   MONOABS 0.6 03/01/2019   EOSABS 0.0 03/01/2019    BMET Lab Results  Component Value Date   NA 141 03/11/2019   K 3.5 03/11/2019   CL 105 03/11/2019   CO2 26 03/11/2019   GLUCOSE 130 (H) 03/11/2019   BUN 20 03/11/2019   CREATININE 0.81 03/11/2019   CALCIUM 8.8 (L) 03/11/2019   GFRNONAA >60 03/11/2019   GFRAA >60 03/11/2019      Assessment and Plan  Chronic hepatitis B without hepatic coma = continue with tenofovir daily  Constipation = recommend increase to lactulose use to have daily BM so that may ease bloating feeling  Abdominal CT to see if abd pain is structural abn for pain

## 2019-03-24 ENCOUNTER — Ambulatory Visit (HOSPITAL_COMMUNITY): Payer: Medicaid Other

## 2019-03-28 IMAGING — DX DG CHEST 2V
2 series · 2 of 2 positions shown · non-contrast
Comparison: 02/09/2017

CLINICAL DATA: Fatigue.  Cough and fever.

EXAM:
CHEST  2 VIEW

[chest pa]
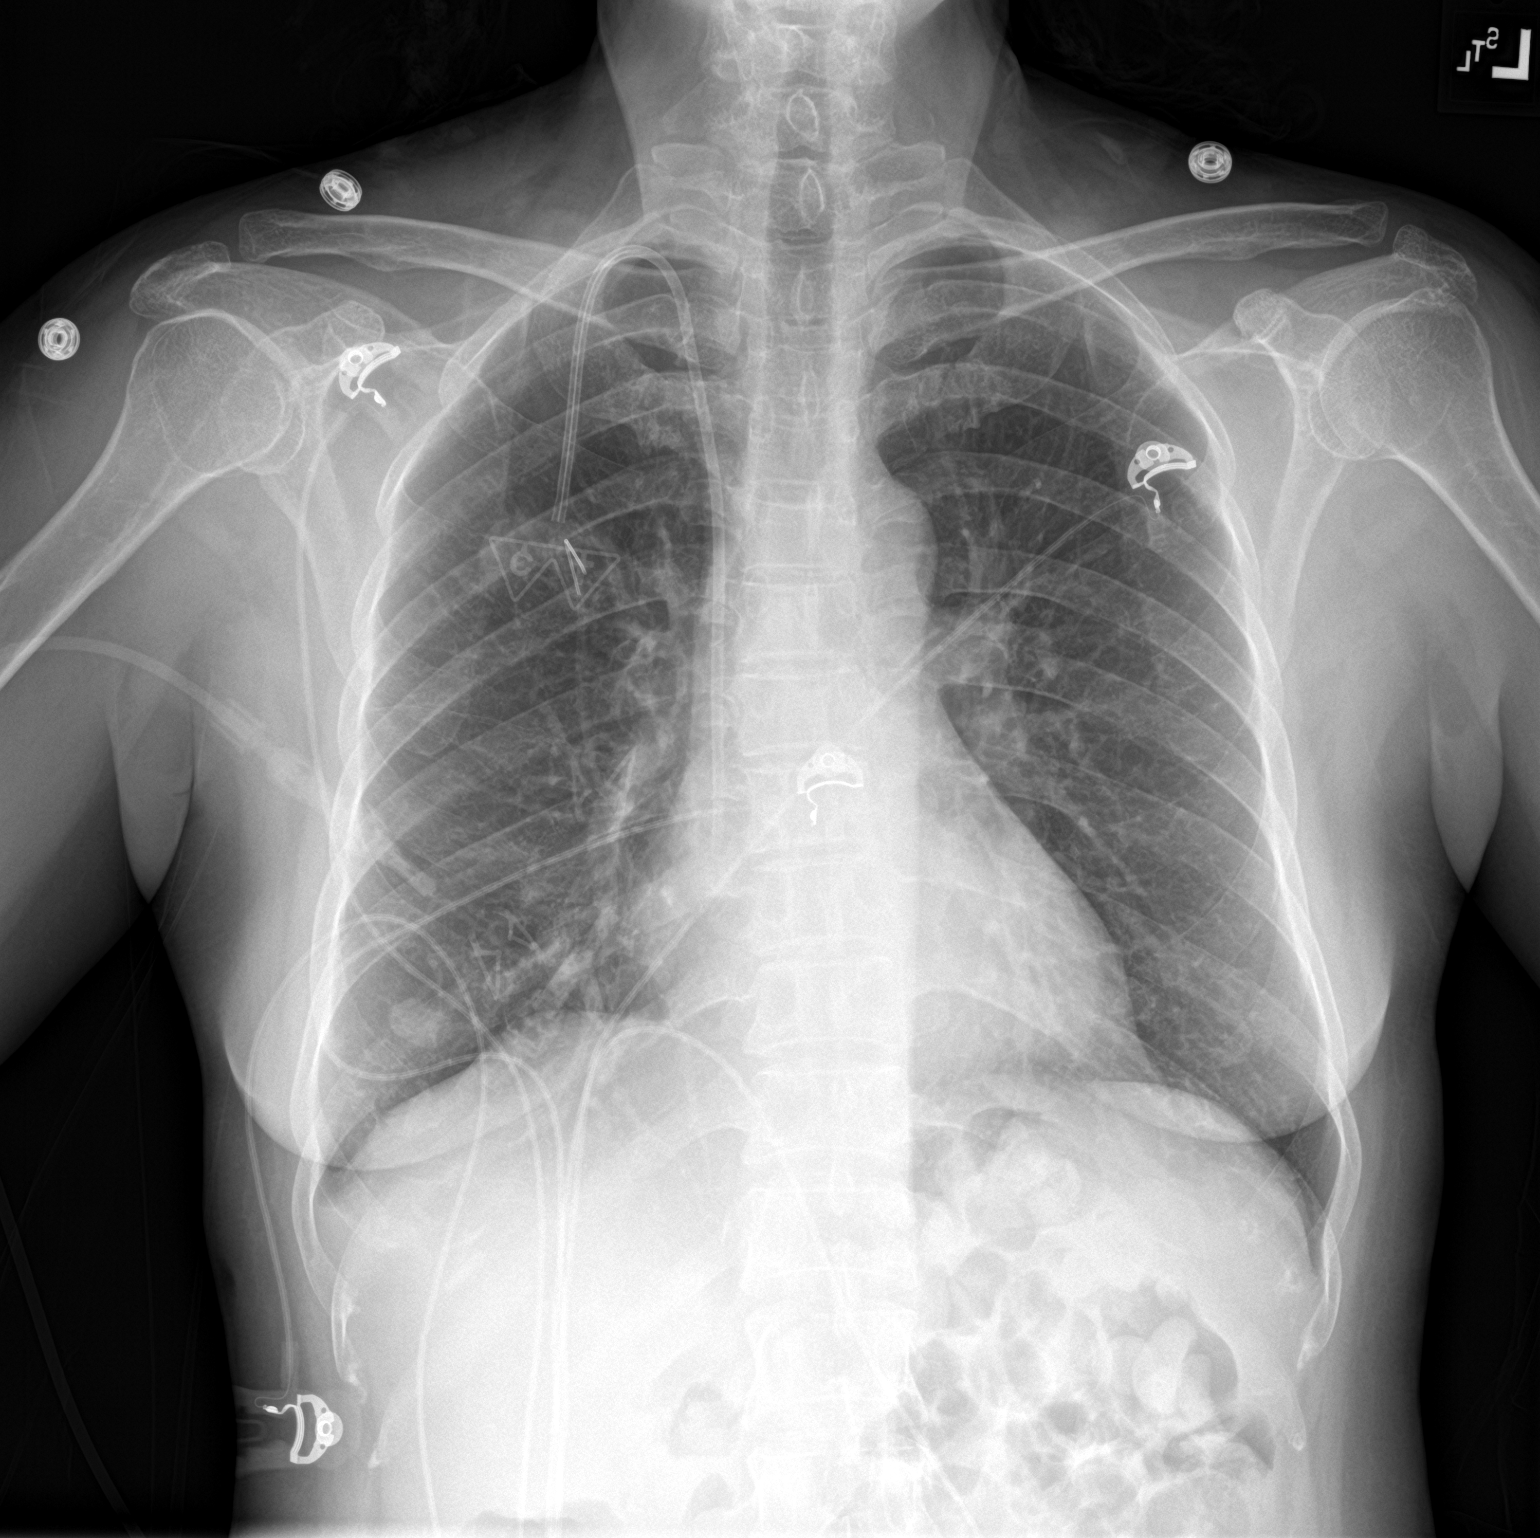

[chest lat]
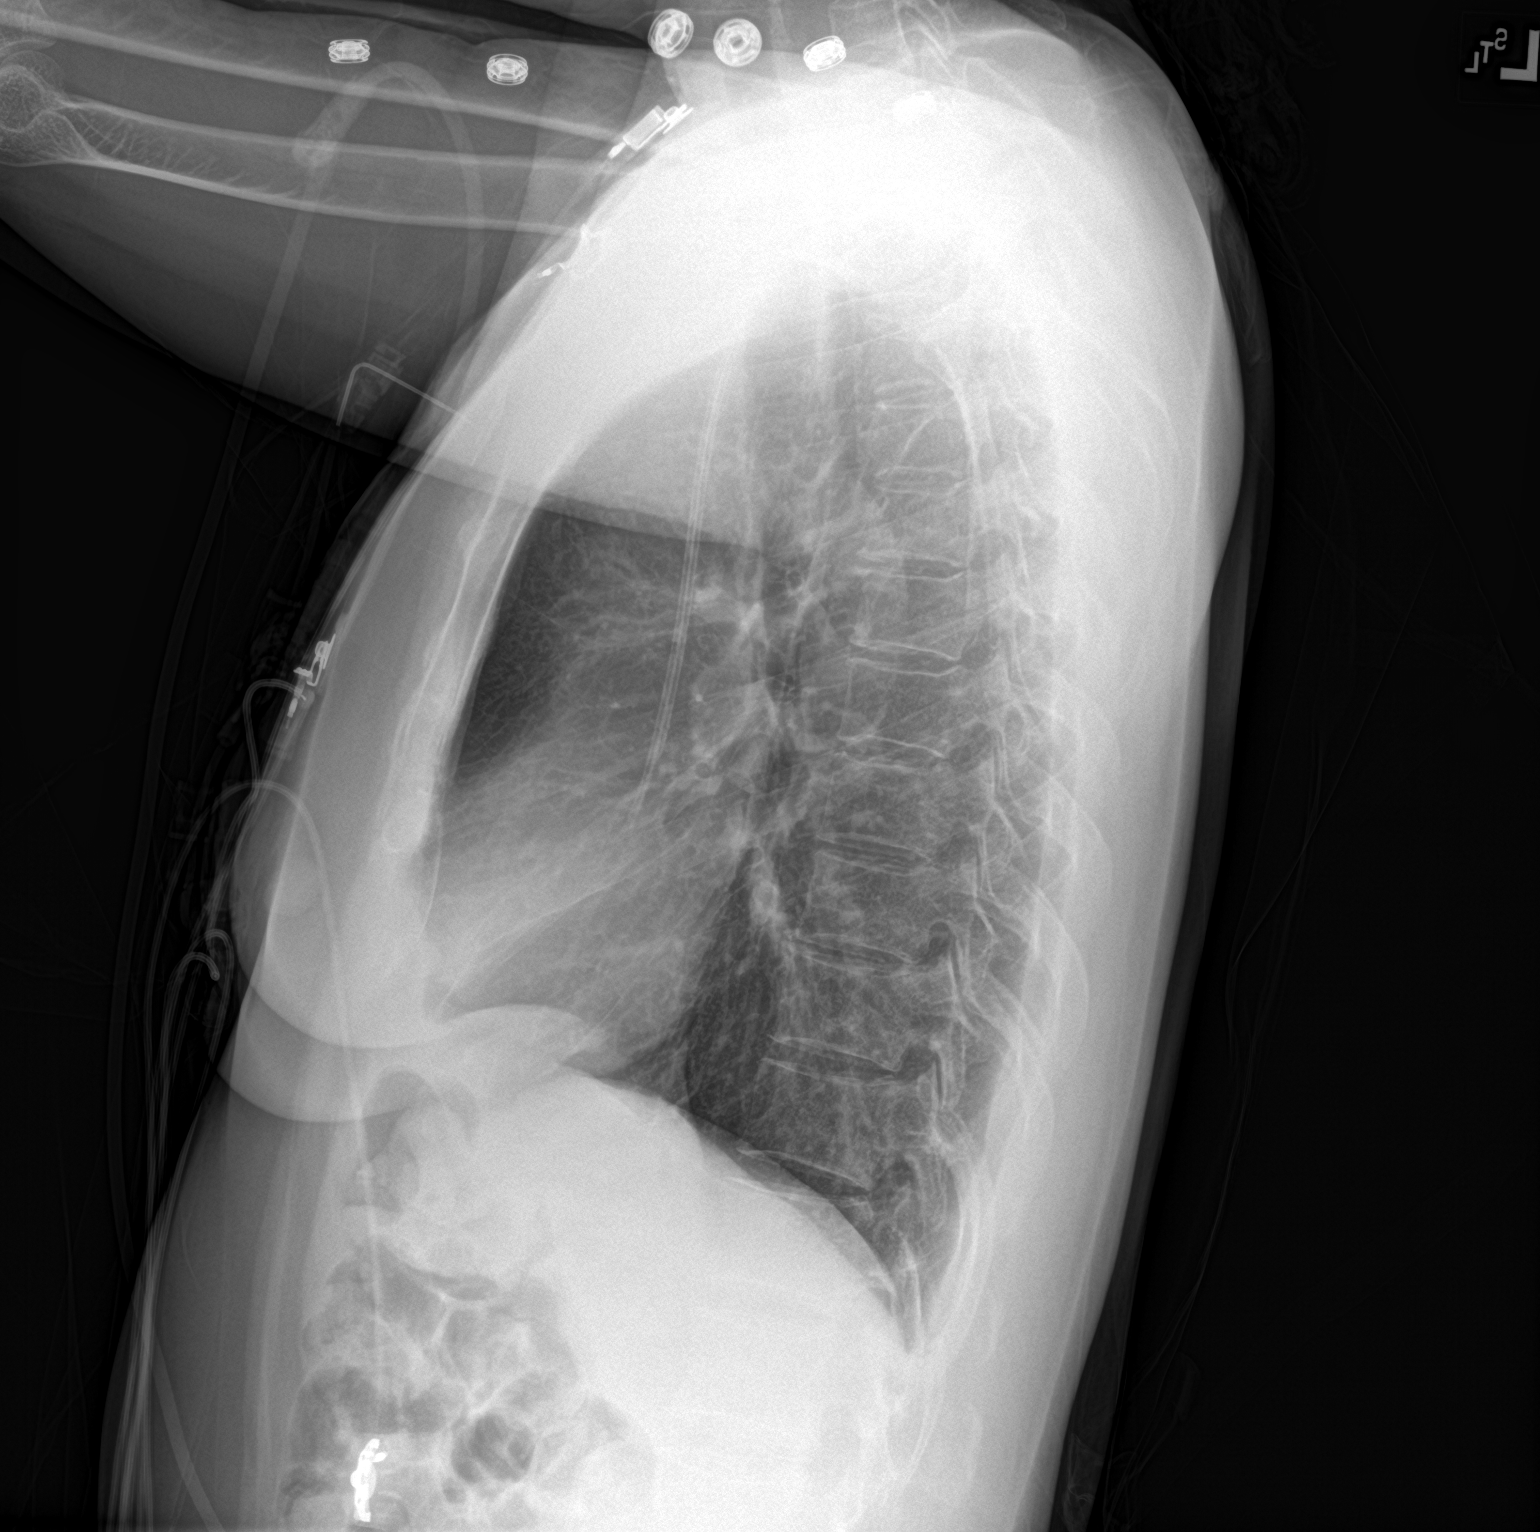

[2 of 2 positions shown; findings below may reference images not displayed]

FINDINGS: Right chest wall port a catheter is noted with tip at the level of
the cavoatrial junction. Heart size is normal. No pleural effusion
or edema. No airspace opacities identified.
IMPRESSION: 1. No active cardiopulmonary abnormality.

## 2019-03-30 ENCOUNTER — Other Ambulatory Visit: Payer: Self-pay

## 2019-03-30 ENCOUNTER — Encounter: Payer: Self-pay | Admitting: Pulmonary Disease

## 2019-03-30 ENCOUNTER — Ambulatory Visit: Payer: Medicaid Other

## 2019-03-30 ENCOUNTER — Ambulatory Visit: Payer: Medicaid Other | Admitting: Nurse Practitioner

## 2019-03-30 ENCOUNTER — Ambulatory Visit: Payer: Medicaid Other | Admitting: Pulmonary Disease

## 2019-03-30 ENCOUNTER — Other Ambulatory Visit: Payer: Medicaid Other

## 2019-03-30 VITALS — BP 124/80 | HR 96 | Ht 62.0 in | Wt 153.4 lb

## 2019-03-30 DIAGNOSIS — Z9481 Bone marrow transplant status: Secondary | ICD-10-CM | POA: Diagnosis not present

## 2019-03-30 DIAGNOSIS — C9012 Plasma cell leukemia in relapse: Secondary | ICD-10-CM | POA: Diagnosis not present

## 2019-03-30 DIAGNOSIS — J849 Interstitial pulmonary disease, unspecified: Secondary | ICD-10-CM

## 2019-03-30 DIAGNOSIS — Z8616 Personal history of COVID-19: Secondary | ICD-10-CM

## 2019-03-30 DIAGNOSIS — C9022 Extramedullary plasmacytoma in relapse: Secondary | ICD-10-CM

## 2019-03-30 NOTE — Progress Notes (Signed)
Synopsis: Referred in November 2020 for shortness of breath by Sheryl Junior, MD  Subjective:   PATIENT ID: Sheryl [name] GENDER: female DOB: [birth year], MRN: [id number]  Chief Complaint  Patient presents with  . Follow-up    Pt states she has been doing good since last visit. Pt states she has been fatigued. Denies any current complaints of SOB.    49 year old female followed by the oncology service past medical history of plasma cell leukemia, chronic hepatitis B, last seen December 02, 2018, by [facility] oncology followed for plasma cell leukemia also followed at [facility].  This started in 2016 when she was found to have mild splenomegaly.  Ultimately underwent bone marrow biopsy in September 2016 with initial diagnosis of plasma cell leukemia.  She was on treated with Cytoxan and bortezomib plus dexamethasone.  Patient underwent autologous stem cell transplant at [facility].  Her transplant course was complicated by sepsis, bacteremia and colitis from E. coli.  She was readmitted in April 2017 for fever.  Post transplant with bone marrow biopsy in 2017, again treated with Cytoxan plus bortezomib plus dexamethasone.  And change to Velcade maintenance therapy.  She has had a list of other medications including daratumumab.  In March 2019 patient started on carfilzomib plus dexamethasone and pomalidomide.  In July diagnosed with a right lower lobe pneumonia.  She continued to have recurrent atypical infection.  Again seen in October with this respiratory infectious symptoms and shortness of breath.  At the beginning of October patient completed course of azithromycin.  Still currently holding, pomalidomide.  Currently denies fevers but still has dyspnea on exertion.  No sputum production.  Today in the office she is accompanied by her daughter.  The patient's primary language is [place].  OV 03/30/2019: Here today for follow-up.  Since last office visit patient has been  diagnosed with recurrence of her plasma cell leukemia and plasmacytoma of the cervical spine.  Also during this time diagnosed with COVID-19.  She has had subsequent imaging and follow-up with oncology, ID and gastroenterology during this time.  Each note reviewed.  Today, patient states her breathing is stable.  Patient has no cough.  She denies fevers chills night sweats hemoptysis.  Patient presents with daughter and [place] interpreter.   Past Medical History:  Diagnosis Date  . Chills with fever    intermittently since d/c from hospital  . Dysuria-frequency syndrome    w/ pink urine  . GERD (gastroesophageal reflux disease)   . Hepatitis   . History of positive PPD    DX 2011--  CXR DONE NO EVIDENCE  . History of ureter stent   . Hydronephrosis, right   . Neuromuscular disorder (HCC)    legs numb intermittently  . Plasma cell leukemia ([place])   . Pneumonia   . Right ureteral stone   . Urosepsis 8/14   admitted to [facility]     Family History  Problem Relation Age of Onset  . Stomach cancer Mother   . Lung disease Father   . Asthma Father      Past Surgical History:  Procedure Laterality Date  . CYSTOSCOPY W/ URETERAL STENT PLACEMENT Right 09/25/2012   Procedure: CYSTOSCOPY WITH RETROGRADE PYELOGRAM/URETERAL STENT PLACEMENT;  Surgeon: Alexis Frock, MD;  Location: [facility];  Service: Urology;  Laterality: Right;  . CYSTOSCOPY WITH RETROGRADE PYELOGRAM, URETEROSCOPY AND STENT PLACEMENT Right 10/15/2012   Procedure: CYSTOSCOPY WITH RETROGRADE PYELOGRAM, URETEROSCOPY AND REMOVAL STENT WITH  STENT PLACEMENT;  Surgeon:  Alexis Frock, MD;  Location[facility];  Service: Urology;  Laterality: Right;  . ESOPHAGOGASTRODUODENOSCOPY (EGD) WITH PROPOFOL N/A 11/16/2014   Procedure: ESOPHAGOGASTRODUODENOSCOPY (EGD) WITH PROPOFOL;  Surgeon: Milus Banister, MD;  Location: [facility];  Service: Endoscopy;  Laterality: N/A;  . HOLMIUM LASER APPLICATION Right 03/17/9415    Procedure: HOLMIUM LASER APPLICATION;  Surgeon: Alexis Frock, MD;  Location: [name];  Service: Urology;  Laterality: Right;  . LIVER BIOPSY    . OTHER SURGICAL HISTORY Right    removal of ovarian cyst  . removal of uterine cyst     years ago  . RIGHT VATS W/ DRAINAGE PEURAL EFFUSION AND BX'S  10-30-2008    Social History   Socioeconomic History  . Marital status: Single    Spouse name: Not on file  . Number of children: 3  . Years of education: Not on file  . Highest education level: Not on file  Occupational History  . Not on file  Tobacco Use  . Smoking status: Never Smoker  . Smokeless tobacco: Never Used  Substance and Sexual Activity  . Alcohol use: No    Alcohol/week: 0.0 standard drinks  . Drug use: No  . Sexual activity: Not Currently    Birth control/protection: Abstinence  Other Topics Concern  . Not on file  Social History Narrative  . Not on file   Social Determinants of Health   Financial Resource Strain:   . Difficulty of Paying Living Expenses: Not on file  Food Insecurity:   . Worried About Charity fundraiser in the Last Year: Not on file  . Ran Out of Food in the Last Year: Not on file  Transportation Needs:   . Lack of Transportation (Medical): Not on file  . Lack of Transportation (Non-Medical): Not on file  Physical Activity:   . Days of Exercise per Week: Not on file  . Minutes of Exercise per Session: Not on file  Stress:   . Feeling of Stress : Not on file  Social Connections:   . Frequency of Communication with Friends and Family: Not on file  . Frequency of Social Gatherings with Friends and Family: Not on file  . Attends Religious Services: Not on file  . Active Member of Clubs or Organizations: Not on file  . Attends Archivist Meetings: Not on file  . Marital Status: Not on file  Intimate Partner Violence:   . Fear of Current or Ex-Partner: Not on file  . Emotionally Abused: Not on file  .  Physically Abused: Not on file  . Sexually Abused: Not on file     No Known Allergies   Outpatient Medications Prior to Visit  Medication Sig Dispense Refill  . aspirin 81 MG chewable tablet Chew 81 mg by mouth daily.    Marland Kitchen dexamethasone (DECADRON) 2 MG tablet Take 2 tablets (4 mg total) by mouth 2 (two) times daily. 30 tablet 0  . ibuprofen (ADVIL,MOTRIN) 200 MG tablet Take 200 mg by mouth every 6 (six) hours as needed for moderate pain.    Marland Kitchen lactulose (CHRONULAC) 10 GM/15ML solution Take 15 mLs by mouth every 3 (three) hours as needed.    . ondansetron (ZOFRAN ODT) 8 MG disintegrating tablet Take 1 tablet (8 mg total) by mouth every 8 (eight) hours as needed for nausea or vomiting. 40 tablet 1  . pantoprazole (PROTONIX) 40 MG tablet Take by mouth.    . polyethylene glycol (MIRALAX /  GLYCOLAX) packet Take 17 g by mouth daily as needed (constipation). 14 each 1  . VIREAD 300 MG tablet TAKE 1 TABLET (300 MG TOTAL) BY MOUTH DAILY. 30 tablet 11  . amLODipine (NORVASC) 5 MG tablet Take 5 mg by mouth daily.    Marland Kitchen gabapentin (NEURONTIN) 300 MG capsule TAKE 1 CAPSULE (300 MG TOTAL) BY MOUTH 3 (THREE) TIMES DAILY. TAKE 300 MG BY MOUTH IN MORNING AND AFTERNOON AND 600MG BY MOUTH IN THE EVENING (Patient not taking: Reported on 03/30/2019) 90 capsule 3  . mirtazapine (REMERON) 7.5 MG tablet Take 1 tablet (7.5 mg total) by mouth at bedtime. (Patient not taking: Reported on 03/30/2019) 30 tablet 1  . prochlorperazine (COMPAZINE) 10 MG tablet Take 1 tablet (10 mg total) by mouth every 6 (six) hours as needed for nausea or vomiting. (Patient not taking: Reported on 03/30/2019) 30 tablet 3  . acyclovir (ZOVIRAX) 800 MG tablet Take 1 tablet (800 mg total) by mouth 2 (two) times daily. 60 tablet 3  . feeding supplement, ENSURE ENLIVE, (ENSURE ENLIVE) LIQD Take 237 mLs by mouth 2 (two) times daily between meals. 237 mL 12  . fluconazole (DIFLUCAN) 200 MG tablet Take 1 tablet (200 mg total) by mouth daily. 14 tablet 0    . morphine (MSIR) 15 MG tablet Take 1 tablet (15 mg total) by mouth every 4 (four) hours as needed for severe pain. 7 tablet 0  . nystatin (MYCOSTATIN) 100000 UNIT/ML suspension Take 5 mLs (500,000 Units total) by mouth 4 (four) times daily. 473 mL 0  . omeprazole (PRILOSEC) 20 MG capsule Take 1 capsule (20 mg total) by mouth daily. 30 capsule 5  . oxyCODONE (OXY IR/ROXICODONE) 5 MG immediate release tablet Take 1 tablet (5 mg total) by mouth every 8 (eight) hours as needed for severe pain. 20 tablet 0  . traMADol (ULTRAM) 50 MG tablet Take 1 tablet (50 mg total) by mouth every 6 (six) hours as needed. 30 tablet 0   Facility-Administered Medications Prior to Visit  Medication Dose Route Frequency Provider Last Rate Last Admin  . sodium chloride 0.9 % injection 10 mL  10 mL Intravenous PRN Truitt Merle, MD   10 mL at 12/11/15 1532  . sodium chloride 0.9 % injection 10 mL  10 mL Intravenous PRN Truitt Merle, MD   10 mL at 06/11/16 1505  . sodium chloride 0.9 % injection 10 mL  10 mL Intravenous PRN Truitt Merle, MD   10 mL at 08/04/17 1643  . sodium chloride flush (NS) 0.9 % injection 10 mL  10 mL Intravenous PRN Truitt Merle, MD   10 mL at 10/09/15 1717    Review of Systems  Constitutional: Negative for chills, fever, malaise/fatigue and weight loss.  HENT: Negative for hearing loss, sore throat and tinnitus.   Eyes: Negative for blurred vision and double vision.  Respiratory: Negative for cough, hemoptysis, sputum production, shortness of breath, wheezing and stridor.   Cardiovascular: Negative for chest pain, palpitations, orthopnea, leg swelling and PND.  Gastrointestinal: Negative for abdominal pain, constipation, diarrhea, heartburn, nausea and vomiting.  Genitourinary: Negative for dysuria, hematuria and urgency.  Musculoskeletal: Positive for myalgias and neck pain. Negative for joint pain.  Skin: Negative for itching and rash.  Neurological: Negative for dizziness, tingling, weakness and  headaches.  Endo/Heme/Allergies: Negative for environmental allergies. Does not bruise/bleed easily.  Psychiatric/Behavioral: Negative for depression. The patient is not nervous/anxious and does not have insomnia.   All other systems reviewed and are negative.  Objective:  Physical Exam Vitals reviewed.  Constitutional:      General: She is not in acute distress.    Appearance: She is well-developed.  HENT:     Head: Normocephalic and atraumatic.  Eyes:     General: No scleral icterus.    Conjunctiva/sclera: Conjunctivae normal.     Pupils: Pupils are equal, round, and reactive to light.  Neck:     Vascular: No JVD.     Trachea: No tracheal deviation.  Cardiovascular:     Rate and Rhythm: Normal rate and regular rhythm.     Heart sounds: Normal heart sounds. No murmur.  Pulmonary:     Effort: Pulmonary effort is normal. No tachypnea, accessory muscle usage or respiratory distress.     Breath sounds: Normal breath sounds. No stridor. No wheezing, rhonchi or rales.  Abdominal:     General: Bowel sounds are normal. There is distension.     Palpations: Abdomen is soft.     Tenderness: There is no abdominal tenderness.     Comments: Mild abdominal distention  Musculoskeletal:        General: No tenderness.     Cervical back: Neck supple.  Lymphadenopathy:     Cervical: No cervical adenopathy.  Skin:    General: Skin is warm and dry.     Capillary Refill: Capillary refill takes less than 2 seconds.     Findings: No rash.  Neurological:     Mental Status: She is alert and oriented to person, place, and time.  Psychiatric:        Behavior: Behavior normal.      Vitals:   03/30/19 0923  BP: 124/80  Pulse: 96  SpO2: 98%  Weight: 153 lb 6.4 oz (69.6 kg)  Height: '5\' 2"'  (1.575 m)   98% on RA BMI Readings from Last 3 Encounters:  03/30/19 28.06 kg/m  03/21/19 28.53 kg/m  03/11/19 26.48 kg/m   Wt Readings from Last 3 Encounters:  03/30/19 153 lb 6.4 oz (69.6 kg)   03/21/19 156 lb (70.8 kg)  03/11/19 144 lb 12.8 oz (65.7 kg)     CBC    Component Value Date/Time   WBC 7.2 03/01/2019 0905   WBC 4.5 01/29/2019 0850   RBC 3.98 03/01/2019 0905   HGB 12.2 03/01/2019 0905   HGB 10.4 (L) 02/02/2017 1317   HCT 38.1 03/01/2019 0905   HCT 33.9 (L) 02/02/2017 1317   PLT 147 (L) 03/01/2019 0905   PLT 172 02/02/2017 1317   MCV 95.7 03/01/2019 0905   MCV 87.6 02/02/2017 1317   MCH 30.7 03/01/2019 0905   MCHC 32.0 03/01/2019 0905   RDW 14.6 03/01/2019 0905   RDW 14.1 02/02/2017 1317   LYMPHSABS 1.8 03/01/2019 0905   LYMPHSABS 0.7 (L) 02/02/2017 1317   MONOABS 0.6 03/01/2019 0905   MONOABS 0.1 02/02/2017 1317   EOSABS 0.0 03/01/2019 0905   EOSABS 0.0 02/02/2017 1317   BASOSABS 0.0 03/01/2019 0905   BASOSABS 0.0 02/02/2017 1317    Chest Imaging: CT chest from October 2020: Bibasilar groundglass opacities. The patient's images have been independently reviewed by me.    Pulmonary Functions Testing Results: No flowsheet data found.  FeNO: None   Pathology: None   Echocardiogram: None   Heart Catheterization: None     Assessment & Plan:     ICD-10-CM   1. Interstitial pulmonary disease (HCC)  J84.9 CT CHEST HIGH RESOLUTION  2. History of bone marrow transplant ([place])  Z94.81 CT CHEST  HIGH RESOLUTION  3. Plasma cell leukemia in relapse (HCC)  C90.12 CT CHEST HIGH RESOLUTION  4. History of COVID-19  Z86.16 CT CHEST HIGH RESOLUTION  5. 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  Concern for multifactorial etiology to include potential underlying atypical infection or related to chemotherapeutic drugs.  Past medical history and new recent diagnosis of relapse of multiple myeloma.  Patient with new plasmacytoma C4 lesion status post 7 fractions of  SBRT.  [facility] and Dr. Burr Medico [name] at [facility]  Patient also has chronic hepatitis B on tenofovir.  After initial consultation patient was found to have COVID-19.  Previous to that was treated for a respiratory-like illness and then subsequently found to be + January 01, 2019.  Unclear if her previous October imaging was consistent with viral pneumonia.  Patient has stayed positive with a repeat positive test and December 2020.  I suspect this due to her chronic immune suppressed state.  Plan Following Extensive Data Review & Interpretation:  . I reviewed prior external note(s) from [address] Gastro[address]ology Dr. Briscoe Burns.  03/22/2019 Dr. Theo Dills, hematology [facility].  Please refer to this note for pre and post transplant evaluations.  Relapse of disease December 2020 through February 04, 2019 new CD4 cord lesion status post 7 fractions SBRT.  03/21/2019 infectious disease Dr. Baxter Flattery, chronic hepatitis B on tenofovir. . I reviewed the result(s) of 03/08/3359 complete metabolic panel, serum creatinine 0.81 . I have ordered repeat HRCT of the chest to be completed in June 2021.  Independent interpretation of tests . Review of patient's CT chest 01/26/2019 images revealed persistent bilateral groundglass opacities. The patient's images have been independently reviewed by me.  03/01/2019 two-view chest imaging with persistent basilar opacities relatively unchanged in comparison to CT of the chest in December. The patient's images have been independently reviewed by me.    Case discussed with Dr. Burr Medico patient's primary oncologist here in [place].   Garner Nash, DO [name] 03/30/2019 9:47 AM

## 2019-03-30 NOTE — Patient Instructions (Addendum)
Thank you for visiting Dr. Valeta Harms at [facility]. Today we recommend the following: Orders Placed This Encounter  Procedures  . CT CHEST HIGH RESOLUTION   Follow up HRCT prior to office visit.   Return in about 4 months (around 07/28/2019) for w/ Dr. Valeta Harms .    Please do your part to reduce the spread of COVID-19.

## 2019-05-05 DEATH — deceased

## 2019-05-16 ENCOUNTER — Telehealth: Payer: Self-pay | Admitting: *Deleted

## 2019-05-16 NOTE — Telephone Encounter (Signed)
Chart review - patient deceased as of 04-27-2019 per [facility] note.  Canceled future appts.

## 2019-05-30 ENCOUNTER — Other Ambulatory Visit: Payer: Self-pay | Admitting: Internal Medicine

## 2019-05-30 DIAGNOSIS — C9020 Extramedullary plasmacytoma not having achieved remission: Secondary | ICD-10-CM

## 2019-06-07 ENCOUNTER — Ambulatory Visit: Payer: Medicaid Other | Admitting: Internal Medicine

## 2019-06-07 ENCOUNTER — Other Ambulatory Visit: Payer: Medicaid Other

## 2019-06-15 ENCOUNTER — Ambulatory Visit: Payer: Medicaid Other | Admitting: Internal Medicine

## 2019-07-28 ENCOUNTER — Inpatient Hospital Stay: Admission: RE | Admit: 2019-07-28 | Payer: Self-pay | Source: Ambulatory Visit

## 2020-02-06 IMAGING — MR MR HEAD W/O CM
8 of 10 series · 41 of 48 positions shown · non-contrast
Comparison: None.

CLINICAL DATA: Chronic non intractable headache. Malignancy
suspected. History of leukemia

EXAM:
MRI HEAD WITHOUT CONTRAST
TECHNIQUE: Multiplanar, multiecho pulse sequences of the brain and surrounding
structures were obtained without intravenous contrast.

[Series 3: DWI · axial · 3.0mm · 1.09mm/px · z∈[-62,+111]mm · 11 of 118 slices shown (1 of 4)]
[im 1/118]
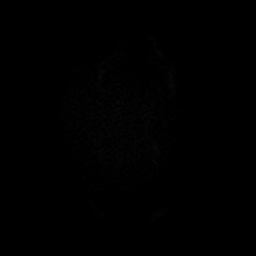
[im 12/118]
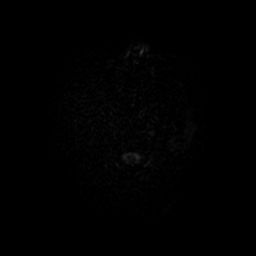
[im 24/118]
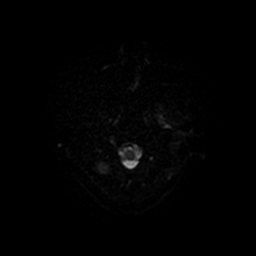
[im 36/118]
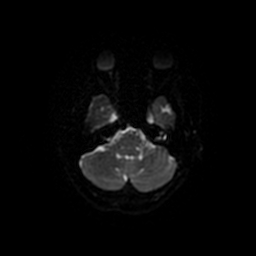
[im 47/118]
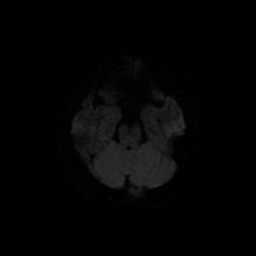
[im 59/118]
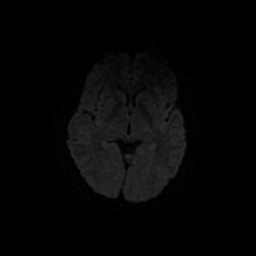
[im 71/118]
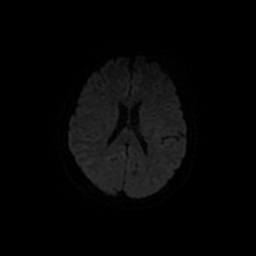
[im 82/118]
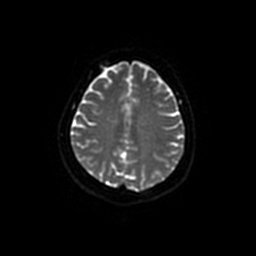
[im 94/118]
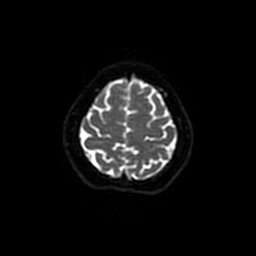
[im 106/118]
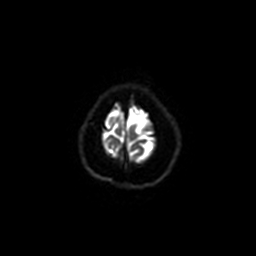
[im 118/118]
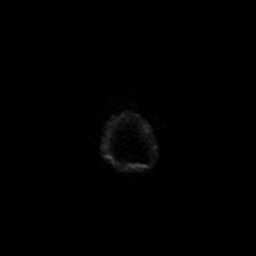

[Series 4: T1 · sagittal · 5.0mm · 0.47mm/px · 2 of 24 slices shown]
[im 1/24]
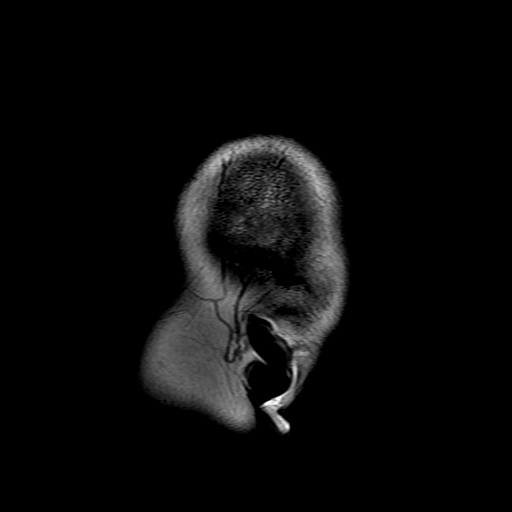
[im 24/24]
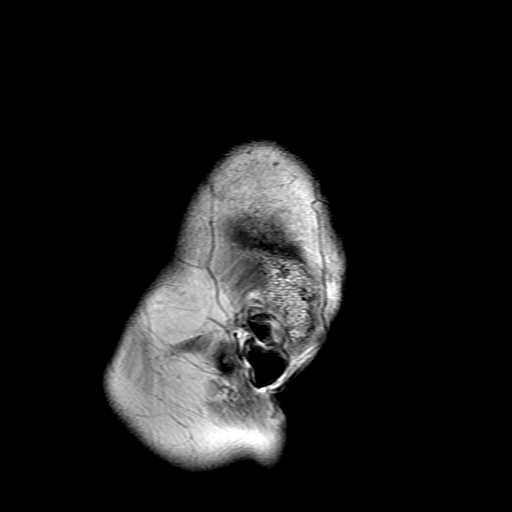

[Series 5: DWI · coronal · 3.0mm · 1.09mm/px · 11 of 118 slices shown (2 of 4)]
[im 1/118]
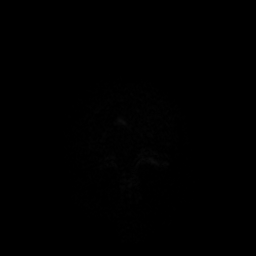
[im 12/118]
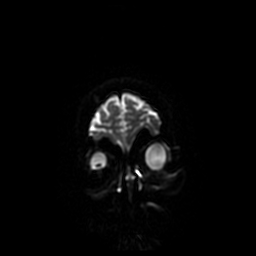
[im 24/118]
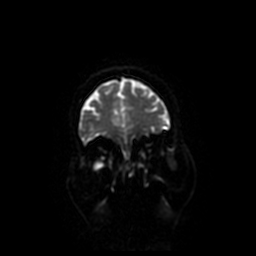
[im 36/118]
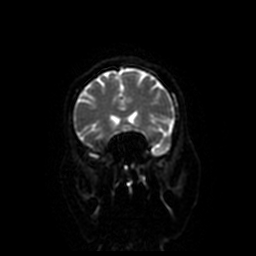
[im 47/118]
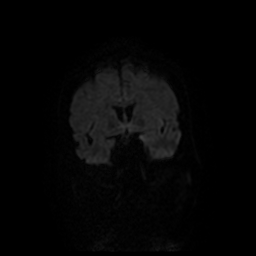
[im 59/118]
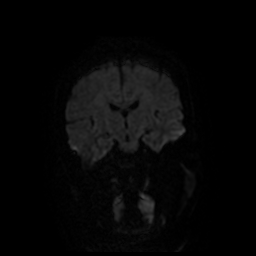
[im 71/118]
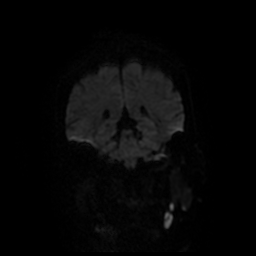
[im 82/118]
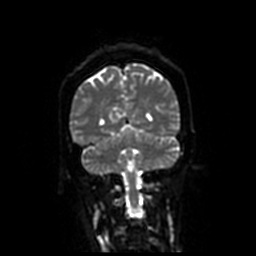
[im 94/118]
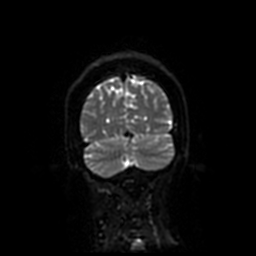
[im 106/118]
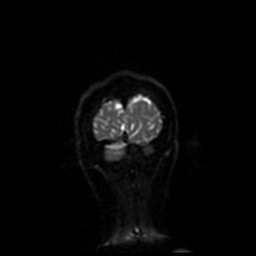
[im 118/118]
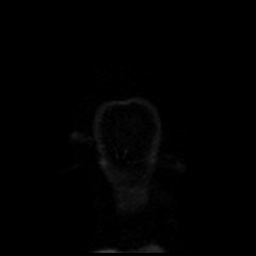

[Series 6: T2 · axial · 5.0mm · 0.43mm/px · z∈[-62,+96]mm · 2 of 24 slices shown (1 of 2)]
[im 1/24]
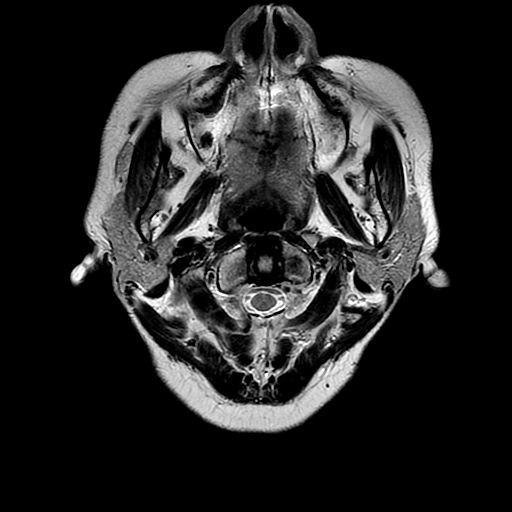
[im 24/24]
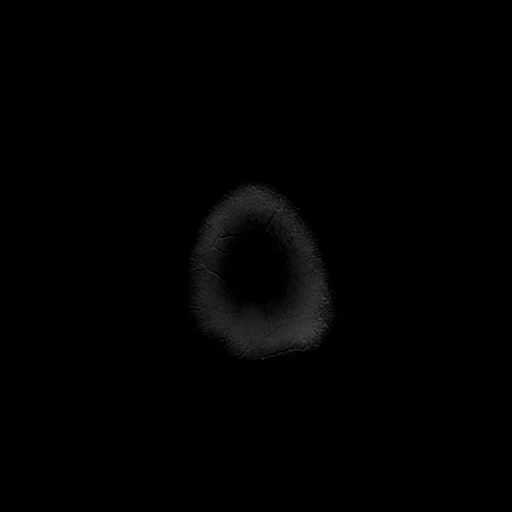

[Series 7: FLAIR · axial · 5.0mm · 0.43mm/px · z∈[-66,+93]mm · 2 of 24 slices shown]
[im 1/24]
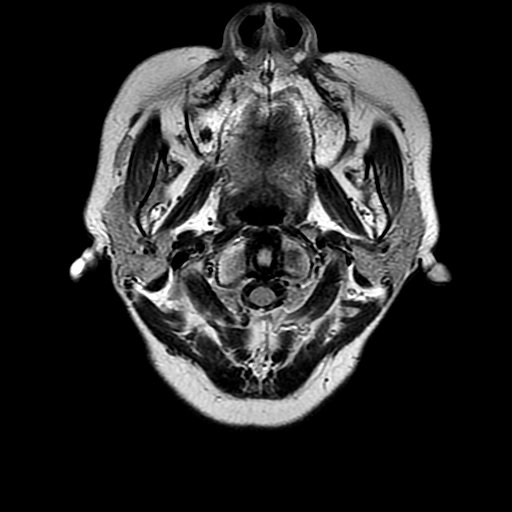
[im 24/24]
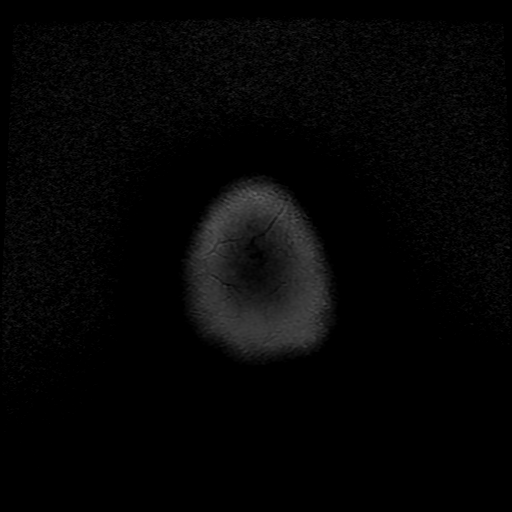

[Series 10: T2 · coronal · 5.0mm · 0.45mm/px · 3 of 28 slices shown (2 of 2)]
[im 1/28]
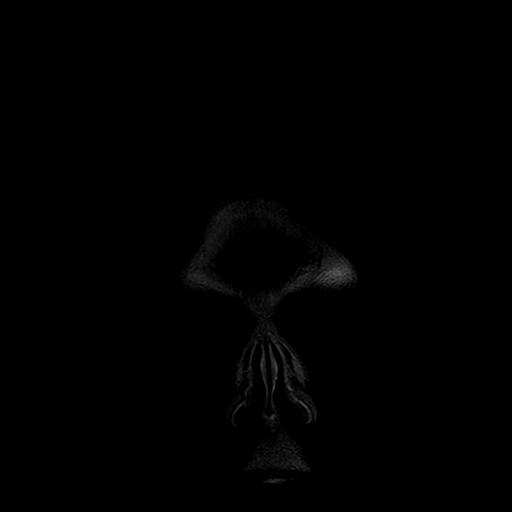
[im 14/28]
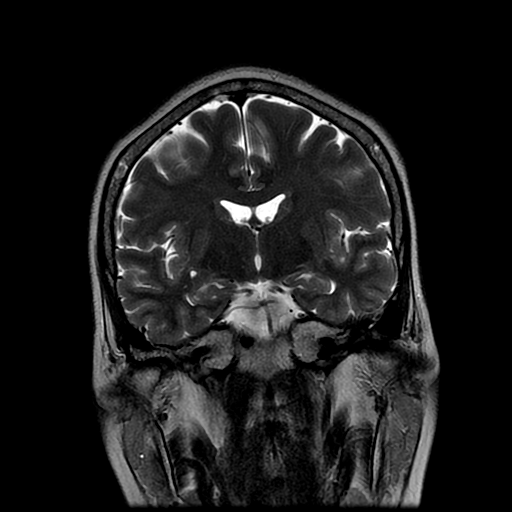
[im 28/28]
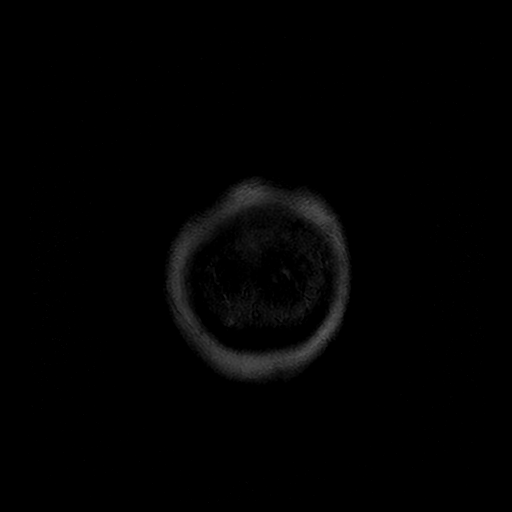

[Series 300: DWI · axial · 3.0mm · 1.09mm/px · z∈[-62,+111]mm · 5 of 59 slices shown (3 of 4)]
[im 1/59]
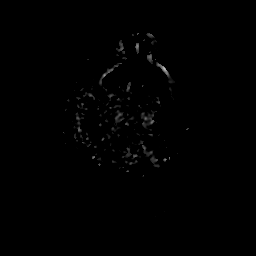
[im 15/59]
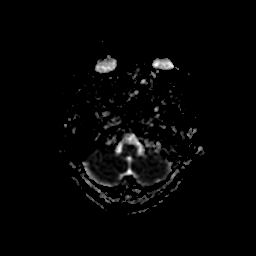
[im 30/59]
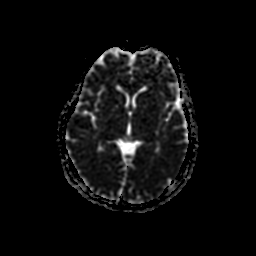
[im 44/59]
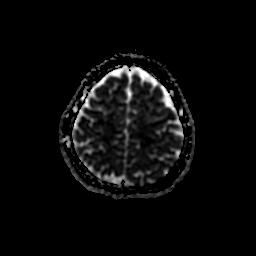
[im 59/59]
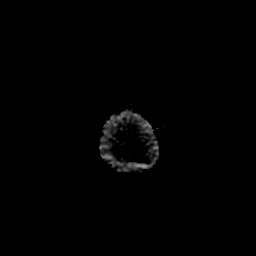

[Series 500: DWI · coronal · 3.0mm · 1.09mm/px · 5 of 59 slices shown (4 of 4)]
[im 1/59]
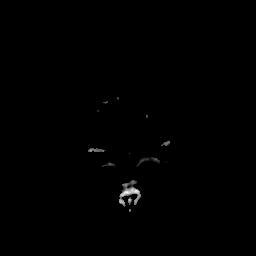
[im 15/59]
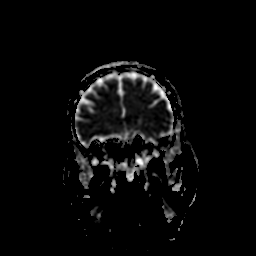
[im 30/59]
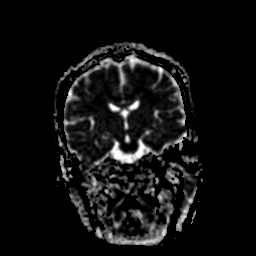
[im 44/59]
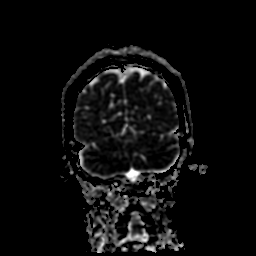
[im 59/59]
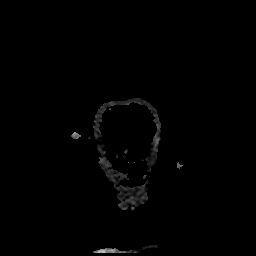

[41 of 48 positions shown; findings below may reference images not displayed]

FINDINGS: Brain: No acute infarction, hemorrhage, hydrocephalus, extra-axial
collection or mass lesion. Few (up to 10) FLAIR hyperintensities in
the cerebral white matter, slightly greater than commonly seen for
age, attributed to nonspecific remote insult. No swelling or, edema
or mass effect.

Vascular: Give major flow voids are preserved

Skull and upper cervical spine: T2 hyperintensity within the type
lobe space of the left parietal bone measuring 12 mm, with
well-defined sclerotic margin, benign-appearing

Sinuses/Orbits: Negative

Other: Best seen on diffusion imaging there are bilateral enlarged
cervical lymph nodes, greater on the left.
IMPRESSION: 1. Negative brain MRI.  No specific explanation for headache.
2. History of leukemia with mildly enlarged lymph nodes in the neck.
